# Patient Record
Sex: Male | Born: 1935
Health system: Southern US, Community
[De-identification: ages and names within clinical notes are randomized; demographics above are authoritative.]

## PROBLEM LIST (undated history)

## (undated) DIAGNOSIS — M4800 Spinal stenosis, site unspecified: Secondary | ICD-10-CM

## (undated) DIAGNOSIS — E7800 Pure hypercholesterolemia, unspecified: Secondary | ICD-10-CM

## (undated) DIAGNOSIS — I739 Peripheral vascular disease, unspecified: Secondary | ICD-10-CM

## (undated) DIAGNOSIS — I509 Heart failure, unspecified: Secondary | ICD-10-CM

## (undated) DIAGNOSIS — N183 Chronic kidney disease, stage 3 (moderate): Secondary | ICD-10-CM

## (undated) DIAGNOSIS — Z95810 Presence of automatic (implantable) cardiac defibrillator: Secondary | ICD-10-CM

## (undated) DIAGNOSIS — I2510 Atherosclerotic heart disease of native coronary artery without angina pectoris: Secondary | ICD-10-CM

## (undated) DIAGNOSIS — Z5189 Encounter for other specified aftercare: Secondary | ICD-10-CM

## (undated) DIAGNOSIS — K635 Polyp of colon: Secondary | ICD-10-CM

## (undated) DIAGNOSIS — Z992 Dependence on renal dialysis: Secondary | ICD-10-CM

## (undated) DIAGNOSIS — H9190 Unspecified hearing loss, unspecified ear: Secondary | ICD-10-CM

## (undated) DIAGNOSIS — J189 Pneumonia, unspecified organism: Secondary | ICD-10-CM

## (undated) DIAGNOSIS — I219 Acute myocardial infarction, unspecified: Secondary | ICD-10-CM

## (undated) DIAGNOSIS — I10 Essential (primary) hypertension: Secondary | ICD-10-CM

## (undated) DIAGNOSIS — J45909 Unspecified asthma, uncomplicated: Secondary | ICD-10-CM

## (undated) DIAGNOSIS — G709 Myoneural disorder, unspecified: Secondary | ICD-10-CM

## (undated) DIAGNOSIS — M1990 Unspecified osteoarthritis, unspecified site: Secondary | ICD-10-CM

## (undated) DIAGNOSIS — I519 Heart disease, unspecified: Secondary | ICD-10-CM

## (undated) DIAGNOSIS — G629 Polyneuropathy, unspecified: Secondary | ICD-10-CM

## (undated) DIAGNOSIS — R296 Repeated falls: Secondary | ICD-10-CM

## (undated) DIAGNOSIS — N186 End stage renal disease: Secondary | ICD-10-CM

## (undated) DIAGNOSIS — H409 Unspecified glaucoma: Secondary | ICD-10-CM

## (undated) DIAGNOSIS — L03114 Cellulitis of left upper limb: Secondary | ICD-10-CM

## (undated) DIAGNOSIS — R931 Abnormal findings on diagnostic imaging of heart and coronary circulation: Secondary | ICD-10-CM

## (undated) DIAGNOSIS — E1140 Type 2 diabetes mellitus with diabetic neuropathy, unspecified: Secondary | ICD-10-CM

## (undated) DIAGNOSIS — R0600 Dyspnea, unspecified: Secondary | ICD-10-CM

## (undated) DIAGNOSIS — I701 Atherosclerosis of renal artery: Secondary | ICD-10-CM

## (undated) DIAGNOSIS — IMO0001 Reserved for inherently not codable concepts without codable children: Secondary | ICD-10-CM

## (undated) DIAGNOSIS — N400 Enlarged prostate without lower urinary tract symptoms: Secondary | ICD-10-CM

## (undated) DIAGNOSIS — R269 Unspecified abnormalities of gait and mobility: Secondary | ICD-10-CM

## (undated) DIAGNOSIS — E785 Hyperlipidemia, unspecified: Secondary | ICD-10-CM

## (undated) HISTORY — PX: LUMBAR DISC SURGERY: SHX700

## (undated) HISTORY — DX: Abnormal findings on diagnostic imaging of heart and coronary circulation: R93.1

## (undated) HISTORY — DX: Heart failure, unspecified: I50.9

## (undated) HISTORY — DX: Cellulitis of left upper limb: L03.114

## (undated) HISTORY — DX: Spinal stenosis, site unspecified: M48.00

## (undated) HISTORY — PX: OTHER SURGICAL HISTORY: SHX169

## (undated) HISTORY — DX: Unspecified abnormalities of gait and mobility: R26.9

## (undated) HISTORY — PX: BACK SURGERY: SHX140

## (undated) HISTORY — PX: PROSTATE SURGERY: SHX751

## (undated) HISTORY — DX: Hyperlipidemia, unspecified: E78.5

## (undated) HISTORY — DX: Pure hypercholesterolemia, unspecified: E78.00

## (undated) HISTORY — DX: Chronic kidney disease, stage 3 (moderate): N18.3

## (undated) HISTORY — DX: Essential (primary) hypertension: I10

## (undated) HISTORY — DX: Polyp of colon: K63.5

## (undated) HISTORY — DX: Type 2 diabetes mellitus with diabetic neuropathy, unspecified: E11.40

## (undated) HISTORY — DX: Benign prostatic hyperplasia without lower urinary tract symptoms: N40.0

## (undated) HISTORY — DX: Heart disease, unspecified: I51.9

## (undated) HISTORY — DX: Atherosclerotic heart disease of native coronary artery without angina pectoris: I25.10

---

## 1983-08-06 HISTORY — PX: CORONARY ARTERY BYPASS GRAFT: SHX141

## 1998-05-01 ENCOUNTER — Ambulatory Visit (HOSPITAL_COMMUNITY): Admission: RE | Admit: 1998-05-01 | Discharge: 1998-05-01 | Payer: Self-pay | Admitting: Cardiology

## 1998-05-31 ENCOUNTER — Encounter: Payer: Self-pay | Admitting: Cardiology

## 1998-05-31 ENCOUNTER — Emergency Department (HOSPITAL_COMMUNITY): Admission: EM | Admit: 1998-05-31 | Discharge: 1998-05-31 | Payer: Self-pay | Admitting: Emergency Medicine

## 1998-12-11 ENCOUNTER — Ambulatory Visit (HOSPITAL_COMMUNITY): Admission: RE | Admit: 1998-12-11 | Discharge: 1998-12-11 | Payer: Self-pay | Admitting: Urology

## 2001-08-05 HISTORY — PX: CARDIAC CATHETERIZATION: SHX172

## 2002-03-04 ENCOUNTER — Ambulatory Visit (HOSPITAL_COMMUNITY): Admission: RE | Admit: 2002-03-04 | Discharge: 2002-03-04 | Payer: Self-pay | Admitting: Cardiology

## 2002-03-04 ENCOUNTER — Encounter: Payer: Self-pay | Admitting: Cardiology

## 2002-05-07 ENCOUNTER — Inpatient Hospital Stay (HOSPITAL_COMMUNITY): Admission: RE | Admit: 2002-05-07 | Discharge: 2002-05-09 | Payer: Self-pay | Admitting: Urology

## 2002-05-07 ENCOUNTER — Encounter (INDEPENDENT_AMBULATORY_CARE_PROVIDER_SITE_OTHER): Payer: Self-pay | Admitting: *Deleted

## 2005-01-28 ENCOUNTER — Encounter (INDEPENDENT_AMBULATORY_CARE_PROVIDER_SITE_OTHER): Payer: Self-pay | Admitting: *Deleted

## 2005-01-28 ENCOUNTER — Inpatient Hospital Stay (HOSPITAL_COMMUNITY): Admission: RE | Admit: 2005-01-28 | Discharge: 2005-01-30 | Payer: Self-pay | Admitting: Specialist

## 2005-05-08 ENCOUNTER — Ambulatory Visit: Payer: Self-pay | Admitting: Gastroenterology

## 2005-06-06 ENCOUNTER — Encounter: Admission: RE | Admit: 2005-06-06 | Discharge: 2005-06-06 | Payer: Self-pay | Admitting: Specialist

## 2005-06-20 ENCOUNTER — Encounter (INDEPENDENT_AMBULATORY_CARE_PROVIDER_SITE_OTHER): Payer: Self-pay | Admitting: Specialist

## 2005-06-20 ENCOUNTER — Ambulatory Visit: Payer: Self-pay | Admitting: Gastroenterology

## 2005-07-03 ENCOUNTER — Ambulatory Visit (HOSPITAL_COMMUNITY): Admission: RE | Admit: 2005-07-03 | Discharge: 2005-07-03 | Payer: Self-pay | Admitting: Gastroenterology

## 2006-06-17 ENCOUNTER — Encounter: Admission: RE | Admit: 2006-06-17 | Discharge: 2006-06-17 | Payer: Self-pay | Admitting: Cardiology

## 2006-06-20 ENCOUNTER — Encounter: Admission: RE | Admit: 2006-06-20 | Discharge: 2006-06-20 | Payer: Self-pay | Admitting: Cardiology

## 2007-11-12 ENCOUNTER — Encounter (INDEPENDENT_AMBULATORY_CARE_PROVIDER_SITE_OTHER): Payer: Self-pay | Admitting: Urology

## 2007-11-12 ENCOUNTER — Ambulatory Visit (HOSPITAL_COMMUNITY): Admission: RE | Admit: 2007-11-12 | Discharge: 2007-11-13 | Payer: Self-pay | Admitting: Urology

## 2007-11-14 ENCOUNTER — Emergency Department (HOSPITAL_COMMUNITY): Admission: EM | Admit: 2007-11-14 | Discharge: 2007-11-14 | Payer: Self-pay | Admitting: Emergency Medicine

## 2008-02-03 ENCOUNTER — Emergency Department (HOSPITAL_COMMUNITY): Admission: EM | Admit: 2008-02-03 | Discharge: 2008-02-03 | Payer: Self-pay | Admitting: Emergency Medicine

## 2008-02-09 ENCOUNTER — Ambulatory Visit: Payer: Self-pay | Admitting: *Deleted

## 2008-02-09 ENCOUNTER — Encounter: Admission: RE | Admit: 2008-02-09 | Discharge: 2008-02-09 | Payer: Self-pay | Admitting: Cardiology

## 2009-06-05 DIAGNOSIS — R931 Abnormal findings on diagnostic imaging of heart and coronary circulation: Secondary | ICD-10-CM

## 2009-06-05 HISTORY — DX: Abnormal findings on diagnostic imaging of heart and coronary circulation: R93.1

## 2010-05-22 ENCOUNTER — Ambulatory Visit: Payer: Self-pay | Admitting: Cardiology

## 2010-06-25 ENCOUNTER — Ambulatory Visit: Payer: Self-pay | Admitting: Cardiovascular Disease

## 2010-08-20 ENCOUNTER — Encounter
Admission: RE | Admit: 2010-08-20 | Discharge: 2010-08-20 | Payer: Self-pay | Source: Home / Self Care | Attending: Cardiology | Admitting: Cardiology

## 2010-08-22 ENCOUNTER — Ambulatory Visit: Payer: Self-pay | Admitting: Cardiology

## 2010-08-24 ENCOUNTER — Encounter: Payer: Self-pay | Admitting: Cardiology

## 2010-08-24 DIAGNOSIS — E7800 Pure hypercholesterolemia, unspecified: Secondary | ICD-10-CM | POA: Insufficient documentation

## 2010-08-24 DIAGNOSIS — I255 Ischemic cardiomyopathy: Secondary | ICD-10-CM | POA: Insufficient documentation

## 2010-08-24 DIAGNOSIS — E785 Hyperlipidemia, unspecified: Secondary | ICD-10-CM | POA: Insufficient documentation

## 2010-08-24 DIAGNOSIS — E875 Hyperkalemia: Secondary | ICD-10-CM | POA: Insufficient documentation

## 2010-10-23 ENCOUNTER — Ambulatory Visit: Payer: Self-pay | Admitting: Cardiology

## 2010-11-01 ENCOUNTER — Ambulatory Visit: Payer: Self-pay | Admitting: Cardiology

## 2010-11-06 ENCOUNTER — Other Ambulatory Visit (INDEPENDENT_AMBULATORY_CARE_PROVIDER_SITE_OTHER): Payer: Medicare Other | Admitting: *Deleted

## 2010-11-06 DIAGNOSIS — E7800 Pure hypercholesterolemia, unspecified: Secondary | ICD-10-CM

## 2010-11-07 ENCOUNTER — Ambulatory Visit (INDEPENDENT_AMBULATORY_CARE_PROVIDER_SITE_OTHER): Payer: Medicare Other | Admitting: Cardiology

## 2010-11-07 ENCOUNTER — Encounter: Payer: Self-pay | Admitting: Cardiology

## 2010-11-07 DIAGNOSIS — I259 Chronic ischemic heart disease, unspecified: Secondary | ICD-10-CM

## 2010-11-07 DIAGNOSIS — E1129 Type 2 diabetes mellitus with other diabetic kidney complication: Secondary | ICD-10-CM | POA: Insufficient documentation

## 2010-11-07 DIAGNOSIS — M1990 Unspecified osteoarthritis, unspecified site: Secondary | ICD-10-CM

## 2010-11-07 DIAGNOSIS — E785 Hyperlipidemia, unspecified: Secondary | ICD-10-CM

## 2010-11-07 DIAGNOSIS — J309 Allergic rhinitis, unspecified: Secondary | ICD-10-CM

## 2010-11-07 DIAGNOSIS — I119 Hypertensive heart disease without heart failure: Secondary | ICD-10-CM

## 2010-11-07 LAB — BASIC METABOLIC PANEL
BUN: 45 mg/dL — ABNORMAL HIGH (ref 6–23)
CO2: 25 mEq/L (ref 19–32)
Calcium: 9.5 mg/dL (ref 8.4–10.5)
Chloride: 104 mEq/L (ref 96–112)
Creatinine, Ser: 2 mg/dL — ABNORMAL HIGH (ref 0.4–1.5)
GFR: 35.8 mL/min — ABNORMAL LOW (ref >60.00)
Glucose, Bld: 94 mg/dL (ref 70–99)
Potassium: 6 mEq/L — ABNORMAL HIGH (ref 3.5–5.1)
Sodium: 137 mEq/L (ref 135–145)

## 2010-11-07 LAB — HEPATIC FUNCTION PANEL
ALT: 35 U/L (ref 0–53)
Albumin: 3.8 g/dL (ref 3.5–5.2)
Alkaline Phosphatase: 54 U/L (ref 39–117)
Bilirubin, Direct: 0.3 mg/dL (ref 0.0–0.3)
Total Bilirubin: 1.6 mg/dL — ABNORMAL HIGH (ref 0.3–1.2)
Total Protein: 6.6 g/dL (ref 6.0–8.3)

## 2010-11-07 LAB — LIPID PANEL
Cholesterol: 138 mg/dL (ref 0–200)
LDL Cholesterol: 82 mg/dL (ref 0–99)
Triglycerides: 72 mg/dL (ref 0.0–149.0)
VLDL: 14.4 mg/dL (ref 0.0–40.0)

## 2010-11-07 NOTE — Assessment & Plan Note (Signed)
The patient has been doing well recently with no recurrent angina pectoris.  His last cardiac catheterization was by Dr. Lia Foyer on 03/04/02 and he did not require intervention at that time he had initial CABG in 1985.  His last nuclear stress was 06/16/09 and showed an ejection fraction of 28% with a left bundle-branch block.  He has a moderate area of inferior wall infarct with minimal reversibility.

## 2010-11-07 NOTE — Progress Notes (Signed)
History of Present Illness: This 77 year old married Caucasian gentleman is seen for a four-month followup office visit.  He has a known history of ischemic heart disease.  He underwent cardiac catheterization on 03/04/02 by Dr. Lia Foyer.  He had had previous coronary artery bypass graft surgery in 1985.  He had a nuclear stress test 06/16/09 which showed moderate inferior wall infarct with only minimal reversibility and his ejection fraction was 28%.  Current Outpatient Prescriptions  Medication Sig Dispense Refill  . amLODipine (NORVASC) 10 MG tablet Take 10 mg by mouth daily.        Marland Kitchen aspirin 81 MG tablet Take 81 mg by mouth daily.        Marland Kitchen atorvastatin (LIPITOR) 10 MG tablet Take 10 mg by mouth daily.        . cetirizine (ZYRTEC) 10 MG tablet Take 10 mg by mouth daily.        . Cholecalciferol (VITAMIN D3) 1000 UNITS CAPS 1,000 mg.        . clarithromycin (BIAXIN) 500 MG tablet Take 500 mg by mouth daily.        Marland Kitchen latanoprost (XALATAN) 0.005 % ophthalmic solution 1 drop at bedtime. As directed       . metFORMIN (GLUCOPHAGE) 500 MG tablet Take 500 mg by mouth daily with breakfast.        . metoprolol (TOPROL-XL) 50 MG 24 hr tablet Take 50 mg by mouth. Take one half daily       . MULTIPLE VITAMINS PO Take by mouth. With added b complex       . naproxen sodium (ANAPROX) 220 MG tablet Take 220 mg by mouth 2 (two) times daily with meals.        . niacin (NIASPAN) 1000 MG CR tablet Take 1,000 mg by mouth at bedtime. Two daily       . spironolactone (ALDACTONE) 25 MG tablet Take 25 mg by mouth daily.        . tadalafil (CIALIS) 20 MG tablet Take 20 mg by mouth daily as needed.        . valsartan-hydrochlorothiazide (DIOVAN-HCT) 320-12.5 MG per tablet Take 1 tablet by mouth daily.        Marland Kitchen zolpidem (AMBIEN) 5 MG tablet Take 5 mg by mouth at bedtime as needed.        Marland Kitchen DISCONTD: potassium chloride SA (K-DUR,KLOR-CON) 20 MEQ tablet Take 20 mEq by mouth daily.        . calcium citrate-vitamin D  (CITRACAL+D) 315-200 MG-UNIT per tablet Take 1 tablet by mouth 2 (two) times daily.        . MULTIPLE VITAMIN PO Take by mouth. Vitamin d 3 1000 iu daily       . triamcinolone (NASACORT) 55 MCG/ACT nasal inhaler 2 sprays by Nasal route daily.          Allergies  Allergen Reactions  . Azithromycin     Patient Active Problem List  Diagnoses  . Ischemic heart disease  . Dyslipidemia  . Hyperkalemia  . Hypercholesterolemia  . Diabetes mellitus  . Benign hypertensive heart disease without heart failure  . Allergic rhinitis  . Osteoarthritis    History  Smoking status  . Former Smoker  Smokeless tobacco  . Not on file    History  Alcohol Use     No family history on file.  Review of Systems: Constitutional: no fever chills diaphoresis or fatigue or change in weight.  Head and neck: no hearing loss, no  epistaxis, no photophobia or visual disturbance. Respiratory: No cough, shortness of breath or wheezing. Cardiovascular: No chest pain peripheral edema, palpitations. Gastrointestinal: No abdominal distention, no abdominal pain, no change in bowel habits hematochezia or melena. Genitourinary: No dysuria, no frequency, no urgency, no nocturia. Musculoskeletal:No arthralgias, no back pain, no gait disturbance or myalgias. Neurological: No dizziness, no headaches, no numbness, no seizures, no syncope, no weakness, no tremors. Hematologic: No lymphadenopathy, no easy bruising. Psychiatric: No confusion, no hallucinations, no sleep disturbance.    Physical Exam: Filed Vitals:   11/07/10 1551  BP: 110/72  Pulse: 84  Weight is 205, down 7 pounds.  The general appearance reveals a well-developed well-nourished gentleman in no distress.Pupils equal and reactive.   Extraocular Movements are full.  There is no scleral icterus.  The mouth and pharynx are normal.  The neck is supple.  The carotids reveal no bruits.  The jugular venous pressure is normal.  The thyroid is not enlarged.   There is no lymphadenopathy.The chest is clear to percussion and auscultation. There are no rales or rhonchi. Expansion of the chest is symmetrical.The precordium is quiet.  The first heart sound is normal.  The second heart sound is physiologically split.  There is no murmur gallop rub or click.  There is no abnormal lift or heave.The abdomen is soft and nontender. Bowel sounds are normal. The liver and spleen are not enlarged. There Are no abdominal masses. There are no bruits.The pedal pulses are good.  There is no phlebitis.There is trace edema  There is no cyanosis or clubbing.He wears a brace on his left foot because of footdrop from prior back surgery.   Assessment / Plan:  Continue same medication and we'll plan to see him in 4 months for office visit and fasting lab work.  Continue careful diet and weight loss

## 2010-11-07 NOTE — Assessment & Plan Note (Signed)
The patient has not been having any hypoglycemic episodes.  His weight loss should continue to favorably impact his diabetes.

## 2010-11-07 NOTE — Assessment & Plan Note (Signed)
The patient has been on a careful diet and has lost 7 pounds since last visit.  We reviewed his blood work which shows improvement.  He will continue same medication.

## 2010-11-07 NOTE — Assessment & Plan Note (Signed)
The patient is not having any headaches or dizzy spells related to his blood pressure.  He has had just trace peripheral edema.

## 2010-12-12 ENCOUNTER — Encounter: Payer: Self-pay | Admitting: Cardiology

## 2010-12-18 NOTE — Consult Note (Signed)
NAME:  Shane [name], FLACH NO.:  [id number]   MEDICAL RECORD NO.:  [id number]          PATIENT TYPE:  EMS   LOCATION:  ED                           FACILITY:  [facility]   PHYSICIAN:  Domingo Pulse, M.D.  DATE OF BIRTH:  April 30, 1934   DATE OF CONSULTATION:  11/14/2007  DATE OF DISCHARGE:                                 CONSULTATION   REASON FOR CONSULTATION:  Urinary retention, status post [place].   HISTORY:  This 77 year old male developed a bladder neck contracture and  underwent surgery on Thursday.  He was sent home on Friday morning after  he had urinated.  The patient did well for the first 24 hours, then  developed some problems with bleeding.  He called.  I was asked to come  to the emergency room for fear that he was in clot retention.   A 22-French coude catheter was placed and a small clot was irrigated and  then he had 1400 mL from his bladder.  This came out freely.  The  bladder was carefully irrigated with normal saline and the urine was  very clear.  It was felt, however, that it would be safer to let him go  home with a catheter for a few days and take this out later.  The  patient was given a prescription for Tylox as well as Ambien CR.  He has  pain medications as well as other medications for postoperative  discomfort such as Pyridium Plus.  We will call on Monday to determine  whether he wants to take the catheter out himself or come into the  office for a voiding trial.      Domingo Pulse, M.D.  Electronically Signed     RJE/MEDQ  D:  11/14/2007  T:  11/14/2007  Job:  [id number]

## 2010-12-18 NOTE — Procedures (Signed)
CAROTID DUPLEX EXAM   INDICATION:  Syncope.  Patient experienced several presyncopal events,  each lasting approximately one minute for the past week.   HISTORY:  Diabetes:  Yes.  Cardiac:  MI and CABG x5 in 1985.  Hypertension:  Yes.  Smoking:  No.  Previous Surgery:  Cardiac.  CV History:  Amaurosis Fugax No, Paresthesias No, Hemiparesis No.                                       RIGHT             LEFT  Brachial systolic pressure:         142               130  Brachial Doppler waveforms:         WNL               WNL  Vertebral direction of flow:        Antegrade         Antegrade  DUPLEX VELOCITIES (cm/sec)  CCA peak systolic                   72                83  ECA peak systolic                   102               97  ICA peak systolic                   78                [id number]  ICA end diastolic                   19                26  PLAQUE MORPHOLOGY:                  Heterogenous      Heterogenous  PLAQUE AMOUNT:                      Mild              Mild  PLAQUE LOCATION:                    ICA               ICA   IMPRESSION:  1. Bilateral 1-39% internal carotid artery stenoses, left < right.  2. Patent external carotid arteries.  3. Bilateral antegrade flow in vertebral arteries.   Preliminary report faxed to Dr. Sherryl Barters office at 3:45 p.m. on  02/09/08.   ___________________________________________  P. Drucie Opitz, M.D.   PB/MEDQ  D:  02/09/2008  T:  02/09/2008  Job:  [id number]

## 2010-12-18 NOTE — Op Note (Signed)
NAME:  Shane [name], Shane [name]              ACCOUNT NO.:  [id number]   MEDICAL RECORD NO.:  [id number]          PATIENT TYPE:  AMB   LOCATION:  DAY                          [facility]ACILITY:  [facility]   PHYSICIAN:  Domingo Pulse, M.D.  DATE OF BIRTH:  Sep 01, 1933   DATE OF PROCEDURE:  11/12/2007  DATE OF DISCHARGE:                               OPERATIVE REPORT   PREOPERATIVE DIAGNOSIS:  Bladder neck contracture.   POSTOPERATIVE DIAGNOSIS:  Bladder neck contracture.   PROCEDURE:  TUR BNC.   SURGEON:  Domingo Pulse, M.D.   ANESTHESIA:  General.   COMPLICATIONS:  None.   DRAINS:  A 20-French Foley catheter.   BRIEF HISTORY:  This 77 year old male is status post TURP, was voiding  beautifully, and then began to develop problems with obstructive  symptoms as well as urgency and frequency.  Cystoscopic examination  revealed a tight bladder neck contracture that was not amenable to  dilation in the office.  The patient is now to undergo TUI bladder neck  contracture or possible TUR bladder neck contracture.  He understands  the risks and benefits of the procedure and gave full informed consent.   PROCEDURE:  After successful induction of general anesthesia, the  patient was placed in the dorsal lithotomy position, prepped with  Betadine and draped in the usual sterile fashion.  Cystoscopy was  performed.  The urethra was visualized in its entirety and found to be  normal.  Beyond the verumontanum, a bladder neck contracture could be  identified.  A guidewire was passed through the bladder neck  contracture.  The bladder neck was then dilated up  to 30-French with  Nikki Dom sounds.  The resectoscope was then inserted into the bladder  with the guidewire in place, which was then withdrawn.  A TUI was  performed.  The bladder neck contracture was split in the midline  starting at the midportion of the trigone, extending out to the  verumontanum.  This was taken down towards the muscle fibers at the  bladder neck and did spring this area open, however, there was still  significant fibrous tissue that made the area partially obstructing.  For that reason, the Collings knife was taken off.  The loop was placed  and resection of all this scarred down tissue was removed.  This caused  a nice wide open bladder neck and prostate with no obstruction of any  kind.  The chips were very fibrous in nature.  They were all removed.  The the patient had very  little in the way of hematuria.  The area was fulgurated.  A 20-French  catheter was placed and placed to straight drainage.  The patient  tolerated procedure and was taken to recovery in good condition.  He  will be discharged in the morning.      Domingo Pulse, M.D.  Electronically Signed     RJE/MEDQ  D:  11/12/2007  T:  11/12/2007  Job:  [id number]

## 2010-12-21 NOTE — H&P (Signed)
NAME:  Shane [name], Shane [name]                        ACCOUNT NO.:  [id number]   MEDICAL RECORD NO.:  [id number]                   PATIENT TYPE:  INP   LOCATION:  [id number]                                 [facility]ACILITY:  [facility]   PHYSICIAN:  Domingo Pulse, M.D.               DATE OF BIRTH:  03-Sep-1933   DATE OF ADMISSION:  05/07/2002  DATE OF DISCHARGE:                                HISTORY & PHYSICAL   ADMISSION DIAGNOSES:  1. Benign prostatic hypertrophy with bladder outlet obstruction.  2. Coronary artery disease.  3. Hypertension.  4. Esophageal reflux disease.   HISTORY OF PRESENT ILLNESS:  The patient is a 77 year old male who has had  problems with bladder outlet obstruction.  The patient has urgency,  frequency, decreased force of stream, and significant nocturia.  He had been  treated with 5-alpha reductase in addition (Avidar), as well as alpha  blockade (Flomax) without any real improvement.  The patient's _________  continues to rise, he is quite bothered by this.  He has been given several  options for therapy, including transurethral resection of prostate, TUNA,  and Targis, and elected to undergo a transurethral resection of prostate.  He is to be admitted following that procedure.   PAST MEDICAL HISTORY:  1. Hypertension.  2. Coronary artery disease.  3. The patient had recent evaluation, including cardiac catheterization on     03/04/02, and a Cardiolite on 02/23/02.  The patient does use nitroglycerin     spray approximately one time a month.  4. Status post coronary artery bypass grafting back in 1985.  5. The patient does have some shortness of breath with exertion, most likely     due to his heart disease.  6. He also suffers from some intermittent nasal allergies of an     environmental nature.  7. The patient does have some extremity weakness in the left leg secondary     to having had three surgeries in [place], and 1997.  8. He is known to have esophageal reflux  disease.  9. He does have some bursitis on the left-hand side.  10.      The patient is hard of hearing, and does have hearing aids,     although usually does not wear these.   CURRENT MEDICATIONS:  1. Flomax 0.4 mg q.d.  2. Lipitor 10 mg q.d.  3. Avidar 0.5 mg q.d.  4. Valium 2 mg p.r.n. sleep.  5. Diovan/hydrochlorothiazide combination one q.d.  6. Norvasc 10 mg q.d.  7. Toprol XL 50 mg q.d.  8. Prilosec 20 mg q.d.  9. Aleve 220 mg three tablets q.d.  This has been discontinued.  10.      Aspirin 5 mg q.d.  This has been discontinued.  11.      Zyrtec 10 mg q.d.  12.      Fish oil one or two tablets q.d.  13.  Over-the-counter garlic, glucosamine, vitamin E, fish oil, vitamin     C, and multivitamins.   PAST SURGICAL HISTORY:  1. Coronary artery bypass grafting in 1985.  2. Lumbar laminectomy in 1981, 1987, and 1997.   SOCIAL HISTORY:  He does not use tobacco or alcohol.   REVIEW OF SYMPTOMS:  As described above.   FAMILY HISTORY:  Noncontributory.   PHYSICAL EXAMINATION:  VITAL SIGNS:  Temperature is 98.6, pulse 76, blood  pressure 138/86.  HEENT:  Normocephalic, atraumatic.  Cranial nerves II-XII intact.  NECK:  Supple, no adenopathy or thyromegaly.  LUNGS:  Clear.  HEART:  Regular rate and rhythm, no murmurs, thrills, gallops, rubs, or  heaves.  The patient does have a median sternotomy incision.  ABDOMEN:  Soft, nontender, no palpable masses, rebound, or guarding.  EXTREMITIES:  No cyanosis, clubbing, or edema.  NEUROLOGIC:  Appears grossly intact.  GENITOURINARY:  Penis is free of any lesions.  Testicles are unremarkable.  The patient has no hydroceles, spermatocele, varicocele, hernia, or  adenopathy.  RECTAL:  Showed a 2 to 3+ benign feeling prostate.   IMPRESSION:  Symptomatic benign prostatic hypertrophy/bladder outlet  obstruction.   PLAN:  Admit following transurethral resection of prostate.                                                 Domingo Pulse, M.D.    RJE/MEDQ  D:  05/07/2002  T:  05/07/2002  Job:  [id number]   cc:   Thomas A. Mare Ferrari, M.D.

## 2010-12-21 NOTE — Cardiovascular Report (Signed)
NAME:  Shane [name], Shane [name]                        ACCOUNT NO.:  [id number]   MEDICAL RECORD NO.:  [id number]                   PATIENT TYPE:  OIB   LOCATION:  2869                                 FACILITY:  [facility]   PHYSICIAN:  Loretha Brasil. Lia Foyer, M.D. [facility]         DATE OF BIRTH:  May 03, 1934   DATE OF PROCEDURE:  DATE OF DISCHARGE:                              CARDIAC CATHETERIZATION   INDICATIONS FOR PROCEDURE:  The patient is a 77 year old gentleman who  underwent revascularization surgery in 1985 by Dr. Redmond Pulling.  At that time, he  had an internal mammary placed with diagonal and LAD, and right internal  mammary placed to the OM, and saphenous vein graft placed to the posterior  descending and posterolateral artery.  He has continued to do well since  1985.  More recently, he has had an urological evaluation, and may require  surgery.  He underwent a Cardiolite study which demonstrated some ischemia  in the distribution of the diagonal as well as the right coronary artery  territory.  The current study was done to assess coronary anatomy.   PROCEDURE:  1. Left heart catheterization.  2. Selective coronary arteriography.  3. Selective left ventriculography.  4. Saphenous vein graft angiography x1.  5. Selective left internal mammary angiography.  6. Selective right internal mammary angiography.  7. Distal aortic root aortography.   DESCRIPTION OF PROCEDURE:  The patient was brought to the cath lab, and  prepped and draped in the usual fashion.  Through an anterior puncture, the  right femoral artery was easily entered.  A 6-French sheath was placed.  Views of the left and right coronary arteries were obtained.  Selective  injections were done in the left internal mammary artery.  The right  internal mammary artery was nonselectively injected as we got close to the  site, but I wanted to limit manipulation of the catheter.  There was  excellent visualization of the vessel.  The vein  graft was thought to be  cannulated, although it is difficult to be sure.  In 1999, the vein graft  was known to be occluded.  Subsequently, the patient was given 5 mg of  intravenous metoprolol and taken to the holding area in satisfactory  clinical condition.   HEMODYNAMIC DATA:  1. Central aorta 141/86.  2. Left ventricle 150/10.  3. No aortic to left ventricular gradient on pull back across the aortic     valve.   ANGIOGRAPHIC DATA:  1. Ventriculography was performed in the RAO projection.  The mid, distal,     and anterolateral segments, apical segments, and distal inferior segments     all appeared to move well.  There appeared to be moderately hypo to near     akinesis of the inferobasal segment.  Ejection fraction is calculated at     43%.  There did not appear to be significant mitral regurgitation and the     aortic  leaflets appeared to open well.  2. The distal aorta demonstrates smooth-appearing aorta throughout.  Both     renal arteries appear to be widely patent.  3. The left main coronary artery is totally occluded.  There is faint     opacification of what appears to be either an intermediate or a high     diagonal branch.  4. The right coronary artery is diffusely diseased proximally and then     totally occluded right after the origin of a small right ventricular     branch.  5. The internal mammary to the second diagonal and distal LAD is widely     patent.  The distal right diagonal and distal LAD both appear to be good     vessels without high grade stenosis.  There is a first diagonal which     appears to fill by  retrograde collaterals from the second diagonal.  In     addition, the PDA fills by retrograde collaterals from the LAD.  6. The right internal mammary appears to be widely patent to the OM.  The OM     is a large caliber vessel that has some luminal irregularity, but no     critical disease.  This appears to collateralize the posterolateral      segment of the right coronary.  7. The vein graft to the posterior descending and posterolateral branch is     known to be occluded on the previous study and is presently occluded.   CONCLUSION:  1. Moderate reduction in global left ventricular function with an     inferobasal wall motion abnormality.  2. Continued patency of the diagonal, left anterior descending, internal     mammary graft with collateralizations of a more proximal diagonal and     posterior descending artery.  3. Continued patency of the right internal mammary to the obtuse marginal     with collateralization of the posterolateral system.  4. Total occlusion of the right coronary artery with total occlusion of vein     grafts to the posterior descending and proximal left anterior descending     with known retrograde collaterals from both the obtuse marginal and left     anterior descending grafts.   DISPOSITION:  I am quite pleased with the present findings.  I suspect that  he can continue to be treated medically at the present time.  Followup with  be with Dr. Mare Ferrari.                                                Loretha Brasil. Lia Foyer, M.D. [facility]    TDS/MEDQ  D:  03/04/2002  T:  03/10/2002  Job:  [id number]   cc:   Thomas A. Mare Ferrari, M.D.   Domingo Pulse, M.D.   Denton Meek. Redmond Pulling, M.D.

## 2010-12-21 NOTE — Op Note (Signed)
NAME:  Shane [name], Shane [name]                        ACCOUNT NO.:  [id number]   MEDICAL RECORD NO.:  [id number]                   PATIENT TYPE:  INP   LOCATION:  [id number]                                 FACILITY:  [facility]   PHYSICIAN:  Domingo Pulse, M.D.               DATE OF BIRTH:  12-22-1933   DATE OF PROCEDURE:  05/07/2002  DATE OF DISCHARGE:                                 OPERATIVE REPORT   PREOPERATIVE DIAGNOSIS:  Benign prostatic hypertrophy with bladder outlet  obstruction.   POSTOPERATIVE DIAGNOSIS:  Benign prostatic hypertrophy with bladder outlet  obstruction.   PROCEDURE:  TURP.   SURGEON:  Domingo Pulse, M.D.   ANESTHESIA:  General.   COMPLICATIONS:  None.   DRAIN:  A 24 French three-way Foley catheter.   BRIEF HISTORY:  This 77 year old male has had problems with bladder outlet  obstruction.  He had decrease in his force of stream along with nocturia,  urgency,and frequency.  He has been on a combination of alpha blockade with  Flomax and a 5-alpha-reductase inhibitor with Avodart.  Despite aggressive  medical management, the patient continues to have a rising AUA symptom score  and is quite symptomatic.  He has requested that a TURP be performed.  He  understands the risks and benefits of the procedure and gave full and  informed consent.   DESCRIPTION OF PROCEDURE:  After the successful induction of general  anesthesia, the patient was placed in the dorsal lithotomy position and  prepped with Betadine and draped in the usual sterile fashion.  The urethra  was dilated to 30 [place] with [employer].  The patient had a very  tight meatus, and it was very difficult to get full dilation.  Attempts at  passing the standard resectoscope were unsuccessful.  A cystoscope was  inserted, and it was found that there was a little bit of trauma to urethra.  A guidewire was then passed in order to aid in dilation.  The patient was  dilated once again using [employer]  sounds.  The smaller of the resectoscope  sheaths was then passed into the bladder successfully.  The resectoscope was  then inserted; the bladder was carefully inspected.  It was free of tumor or  stones.  Both ureteral orifices were normal in configuration and location.  The patient had lateral lobe hypertrophy as well as a somewhat high and  tight bladder neck.  Bladder neck was taken down until flat.  The median  lobe was then resected all the way back to the verumontanum.  This was  followed by resection of the right lateral lobe starting at the 11o'clock  position extending down to the floor of the prostate.  The left lateral lobe  was resected in identified fashion.  A small amount of apical tissue as well  as a little bit of tissue from the top of the prostate were removed.  The  chips were all irrigated from the bladder.  Adequate hemostasis was  obtained.  Visual inspection showed a wide open prostatic fossa with little  if any residual tissue.  Verumontanum and sphincter mechanism had not been  injured, and bladder neck had not been undermined.  The ureteral orifices  were normal.  The trigone was nicely exposed.  The chips had all been  removed from the bladder.  Adequate hemostasis was obtained.  On rectal  examination, could find little if any residual prostatic tissue.  Once a  final inspection showed that all chips had been removed and adequate  hemostasis had been obtained, the resectoscope was removed.  A 24 French  three-way catheter was then easily passed into the bladder using a Stylet  guide.  The bladder was placed to straight drainage, and the Foley balloon  was filled.  Slight traction was placed.  The catheter irrigated freely.  Continuous bladder irrigation was initiated with normal saline.  The  catheter was attached to the patient with a catheter strap, and slight  traction was applied.  The patient tolerated the procedure well and was  taken to the recovery  room in good condition.                                               Domingo Pulse, M.D.    RJE/MEDQ  D:  05/07/2002  T:  05/07/2002  Job:  [id number]   cc:   Thomas A. Mare Ferrari, M.D.

## 2010-12-21 NOTE — Consult Note (Signed)
NAME:  Shane [name], Shane [name]              ACCOUNT NO.:  [id number]   MEDICAL RECORD NO.:  [id number]          PATIENT TYPE:  INP   LOCATION:  1521                         FACILITY:  [facility]   PHYSICIAN:  Synthia Innocent, M.D.  DATE OF BIRTH:  August 29, 1933   DATE OF CONSULTATION:  01/28/2005  DATE OF DISCHARGE:                                   CONSULTATION   REQUESTING PHYSICIAN:  Susa Day, M.D.   CHIEF COMPLAINT:  Postoperative retention.   HISTORY OF PRESENT ILLNESS:  This 77 year old man underwent back surgery  earlier on in the day.  The staff had trouble inserting a Foley catheter.  I  was called in to place the catheter.  The patient apparently had a TUR of  the prostate by Dr. Amalia Hailey 3-4 years ago.  He states to some difficulty  voiding since.   PAST MEDICAL HISTORY:  The patient takes Tylenol and Aleve for medications.   No known drug allergies.   Denies any smoking or drinking.   Negative family history.   Preoperatively, he was demonstrated to have multilevel lumbar spondylosis as  well as disk degeneration and also some laminectomy defects.   LABORATORY DATA:  His preop BUN was 25, creatinine 1.5.  Electrolytes were  normal.  His calcium was 8.9.   Preop chest x-ray showed no active disease and history of a CABG.   His EKG showed normal sinus rhythm with an occasional PVC.   PHYSICAL EXAMINATION:  GENERAL:  He is in no acute distress.  ABDOMEN:  Somewhat of a protuberant abdomen.  No obvious palpable bladder.  GENITOURINARY:  Uncircumcised male.  Normal penis and meatus.  No lesions.  Normal scrotum and testicles.  RECTAL:  Deferred.   By bladder scan in the bed, he was demonstrated to have a 586 cc residual.  I went over the findings with the patient and I recommended that we try to  put a catheter in.  He understands and agrees to the proposed procedure.   Initially, I injected 20 cc of Xylocaine jelly.  I then attempted to pass a  catheter without success.  I  then went ahead and was able to get a filiform  into the bladder, after several attempts.  I then followed up with both a 12  and 14 [place] follower; however, I was not able to get a 16 [place] follower  in, so I backed in and put a 14 [place] in and drained off about 600 cc of  grossly clear urine.  This was submitted to the lab for a culture.  I then  taped the filiform  and follower to a drainage bag, and we plan to keep this in overnight and  then take it out in the morning for a trial of voiding or possibly try to  put in a catheter.  I suspect he has a bladder neck contracture, but he  should be stable for tonight.       RFS/MEDQ  D:  01/28/2005  T:  01/28/2005  Job:  [id number]

## 2010-12-21 NOTE — Op Note (Signed)
NAME:  Shane [name], Shane [name]              ACCOUNT NO.:  [id number]   MEDICAL RECORD NO.:  [id number]          PATIENT TYPE:  AMB   LOCATION:  DAY                          [facility]ACILITY:  [facility]   PHYSICIAN:  Susa Day, M.D.    DATE OF BIRTH:  April 20, 1934   DATE OF PROCEDURE:  01/28/2005  DATE OF DISCHARGE:                                 OPERATIVE REPORT   PREOPERATIVE DIAGNOSES:  1.  Spinal stenosis.  2.  Herniated nucleus pulposus at L1-2.  3.  Lateral recess stenosis.  4.  Multilevel lumbar spondylosis.   POSTOPERATIVE DIAGNOSES:  1.  Spinal stenosis.  2.  Herniated nucleus pulposus at L1-2.  3.  Lateral recess stenosis.  4.  Multilevel lumbar spondylosis.   PROCEDURES PERFORMED:  1.  Central lumbar decompression at L1-2.  2.  Lateral recess decompression with foraminotomies of L2 and L1      bilaterally.  3.  Microdiskectomy at L1-2.   ANESTHESIA:  General.   SURGEON:  Johnn Hai, M.D.   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  He had some hip adductor weakness and hip flexor weakness.  He has a  history of multilevel lumbar spondylosis and status post multiple level  lumbar decompressions.  He had been refractory to conservative treatment  with severe pain and weakness.  Operative intervention was indicated for  decompression essentially and microdiskectomy and lateral recess  decompression.  The risks and benefits discussed, including bleeding,  infection, damage to neurovascular structures, CSF leakage, epidural  fibrosis, adjacent segment disease, need for fusion in the future,  persistent pain, anesthetic complications, DVT, PE, etc.   TECHNIQUE:  The patient in supine position.  After the induction of adequate  general anesthesia and  1 g of Kefzol, she was placed prone on the [place]  frame and all bony prominences well-padded.  Prior to this, we attempted to  place a routine Foley.  It was unsuccessful, followed by a coude catheter  which was unsuccessful as well.  We therefore placed him in a supine  position, prepped and draped him in the usual sterile fashion.  Two 18 gauge  spinal needles were utilized to localize the 1-2 spinous process, confirmed  with x-ray.  An incision was made from the spinous process of 1 to 2.  Subcutaneous tissue was dissected.  Electrocautery was utilized to achieve  hemostasis.  The dorsolumbar fascia was identified and divided in line with  the skin incision.  Paraspinous muscle elevated from the lamina of 1 and 2.  Kochers were placed in the spinous process of 1 and 2.  This was confirmed  with x-ray.  McCullough retractor was placed.  Operating microscope was  draped and brought into the surgical field.  Due to the small interlaminar  space, it was felt a central decompression at this level was appropriate.  Performed a central  decompression with removal of interspinous ligament in  partial of the spinous process of 1 and of 2 to the lamina.  Hemilaminotomy  of the caudad edge of 1 was performed bilaterally with detachment of the  ligamentum flavum.  Foraminotomies and detachment of ligamentum flavum from  the cephalad edge of 2 was performed as well, first laterally and into the  foramen with a 2 mm Kerrison only.  There was severe lateral recess stenosis  secondary to ligamentum facet hypertrophy bilaterally.  Again, we  decompressed first laterally, performed a partial medial hemifacetectomy,  and then the ligamentum flavum was removed as well.  No retraction on the  thecal sac was utilized.  There was steady soft tissue bleeding from the  cancellous bone as well as from the epidural venous plexus.  Bipolar  electrocautery and bone wax were utilized to achieve hemostasis.  After  we  performed the decompression out laterally from the medial border of the  pedicle, performed foraminotomies, then further removed the ligamentum  flavum from the left and the lateral recess, undercutting the facets and the  foraminotomy of L1 was performed as well.  Next, we were able to gain  lateral to the thecal sac at this level and with a nerve hook, we were able  to mobilize then multiple free fragments of disk material from beneath the 1  and 2 roots and the thecal sac as well as cephalad.  These were retrieved  with a micropituitary and sent to pathology.  Went above the L1 nerve root  as well.  At the L1-2 disk space, was able to removed disk from the  subannular space as well as medially.  As well, this was further mobilized  with a nerve hook laterally and medially, further mobilizing multiple pieces  that were retrieved with a micropituitary.  Following this, the root of 1  and 2 were free.  I placed a hockey-stick probe out the foramen of 2 and 1  and found to be widely patent bilaterally.  Epidural venous plexus was  cauterized with bipolar electrocautery.  Again, I examined above and below  the 1 root and down in the 2 foramen beneath the thecal sac, sweeping with a  nerve hook, and no additional fragments were retrieved, and I checked from  the contralateral side as well.  No residual free fragments or further  significant disk herniation from the L1-2 space was noted.  Disk space was  copiously irrigated with antibiotic irrigation.  One point during the  procedure, the patient had performed a Valsalva maneuver and slightly moved  from his four-point position.  He was repositioned without difficulty.  Bone  wax placed in the cancellous surfaces.  I placed thrombin-soaked Gelfoam on  the lateral recesses and over the laminotomy defect.  There was no evidence  of active bleeding.  This bone wax was placed over the partial of the cancellous surface of the spinous processes  as well.  Placed a Hemovac and  brought out through a lateral stab wound in the skin.  The fascia was  reapproximated #1 Vicryl interrupted figure-of-eight suture.  The  subcutaneous tissue reapproximated with 2-0 Vicryl simple sutures.  The skin  was reapproximated with staples.  The wound was dressed sterilely.  He was  placed supine on the hospital bed, extubated without difficulty, and  transported to the recovery room in satisfactory condition.   The patient tolerated the procedure well, and there were no complications.  Blood loss was  150 mL.       JB/MEDQ  D:  01/28/2005  T:  01/28/2005  Job:  [id number]

## 2010-12-21 NOTE — H&P (Signed)
NAME:  Shane [name], Shane [name]                        ACCOUNT NO.:  [id number]   MEDICAL RECORD NO.:  [id number]                   PATIENT TYPE:  OIB   LOCATION:  2869                                 FACILITY:  [facility]   PHYSICIAN:  Davis Gourd, P.A.-C.             DATE OF BIRTH:  [birth year]   DATE OF PROCEDURE:  DATE OF DISCHARGE:                      STAT - MUST CHANGE TO CORRECT WORK TYPE   CHIEF COMPLAINT:  Shortness of breath and abnormal Cardiolite.   HISTORY OF PRESENT ILLNESS:  The patient is a 77 year old male with a  history of bypass surgery in 1985 who has been followed closely by Dr.  Joyice Faster. Brackbill.  His last heart catheterization was in 1999 and at that  time the LIMA to first diagonal and LAD was patent and the RIMA to the OM  was patent but the SVG to PDA and PLA was totalled.  There was collateral  circulation and medical therapy was recommendation.   The patient has been having some exertional shortness of breath, although he  denies any kind of chest pain.  He does not exercise regularly but the  symptoms are generally exertional.  He has not had any resting symptoms.  Because there is a possibility that he will need prostate surgery in the not  too distant future, he had an outpatient Cardiolite performed by Dr. Joyice Faster. Brackbill.  The Cardiolite showed an inferior scar with peri-infarct  ischemia.  Additionally, there was reversible ischemia in the distribution  of the diagonal.  It was also noted that the EF was decreased at 29%.  He  was referred for outpatient cardiac catheterization by Dr. Loretha Brasil.  Stuckey.   PAST MEDICAL HISTORY:  1. Status post aortocoronary bypass surgery in 1985 with LIMA to the first     diagonal and LAD, RIMA to OM-1, SVG to PDA and PLA.  2. Status post cardiac catheterization in 1999 with left main totalled,     right coronary artery 90%, acute marginal 99%, SVG to RCA totalled, other     grafts patent, left to right  collaterals and EF of 50-55%.  3. Status post MI in 1985 with CABG.  4. Hypertension.  5. Hyperlipidemia.  6. Family history of premature coronary artery disease.  7. Mild obesity.  8. History of BPH.  9. History of osteoarthritis/bursitis.   PAST SURGICAL HISTORY:  Cardiac catheterization x 2, CABG, and multiple back  surgeries.   SOCIAL HISTORY:  He lives in [place] with his wife and he is retired  from [employer].  He is working now as a Company secretary.  He has no  significant tobacco history and does not drink alcohol.  He exercises  occasionally and his diet is moderately healthy.   FAMILY HISTORY:  His mother did not have coronary artery disease but his  father died of a MI at age 68 and his son is currently living but has had  stents x 2.  His siblings have a known history of coronary artery disease.   ALLERGIES:  He had a rash with ZITHROMAX.   MEDICATIONS:  1. Coated aspirin 325 mg q.d.  2. Valium 2 mg p.r.n.  3. Nasacort p.r.n.  4. Prilosec 20 mg q.d.  5. Niaspan 750 mg b.i.d.  6. Toprol XL 25 mg q.d.  7. Zyrtec 10 mg q.d.  8. Norvasc 5 mg q.d.  9. Diovan/HCTZ 80/12.5 mg q.d.  10.      Aleve 2-3 tablets q.d.   REVIEW OF SYSTEMS:  Positive for dyspnea on exertion but he denies  orthopnea, paroxysmal nocturnal dyspnea, edema, or palpitations.  There is  no significant cough or wheezing.  He had some urinary frequency and some  nocturia.  Otherwise has no problems with urination.  He has left lower  extremity numbness from his ongoing back problems and he has had some  arthralgias in his left shoulder as well.  He has rare GERD symptoms since  he has been on the Prilosec.  He has had no other GI problems.  Review of  systems is otherwise negative.   PHYSICAL EXAMINATION:  VITAL SIGNS:  Temperature 97.4, blood pressure  152/83, pulse 84, respiration 20.  GENERAL:  He is a well-developed, slightly obese white male in no acute  distress.  HEENT:  His head  is normocephalic and atraumatic.  Pupils are equal, round,  and reactive to light and accommodation.  Sclerae are clear and nares are  without discharge.  NECK:  Supple and without lymphadenopathy, thyromegaly, or bruit.  No JVD is  noted.  LYMPHADENOPATHY:  None.  CARDIOVASCULAR:  Heart is regular in rate and rhythm with S1, S2, and no  significant S4 or murmur.  PULSES:  Distal pulses are 2+ and equal and he has no bruits noted.  LUNGS:  Clear to auscultation bilaterally.  SKIN:  Some ecchymoses from prior blood draws but no significant rash or  lesions.  ABDOMEN:  Soft and nontender with active bowel sounds and no  hepatosplenomegaly noted.  EXTREMITIES:  There is no cyanosis, clubbing, or edema.  There is no rashes  or lesions.  MUSCULOSKELETAL:  He has no joint deformity or effusions.  No spine or CVA  tenderness.  There is no area of tenderness in his left shoulder.  NEUROLOGIC:  He is alert and oriented with cranial nerves II through XII  grossly intact and no focal deficits noted.   LABORATORY DATA:  He is in sinus rhythm with a rate of 73.  There are no  acute ischemic changes.  Chest x-ray is pending at the time of dictation.   Hemoglobin 14.2, hematocrit 41.6, WBC 5.5, platelets 124,000.  Sodium 139,  potassium 4.4, chloride 106, CO2 27, BUN 23, creatinine 1.1, glucose 120.  Pro time/INR is pending.  PSA done July 2003 is 1.18.  Cholesterol profile  on July 2003 showing total cholesterol is 187 with triglycerides of 163, HDL  26, LDL 128.   ASSESSMENT/PLAN:  1. Shortness of breath, abdominal Cardiolite:  The patient is here for a     cardiac catheterization today.  Percutaneous intervention p.r.n.  2. Hyperlipidemia:  Consider for addition of statin per Dr. Joyice Faster.     Brackbill.  3. Mild hyperglycemia:  Consider for further evaluation per Dr. Marcello Moores A.     Brackbill.  4.    History of benign prostatic hypertrophy:  The patient follows with Dr.     Marcello Moores  A.  Brackbill and Dr. Domingo Pulse for this.  5. The patient is otherwise stable.  See past medical history.                                               Davis Gourd, P.A.-C.    RG/MEDQ  D:  03/04/2002  T:  03/06/2002  Job:  [id number]

## 2010-12-21 NOTE — Discharge Summary (Signed)
NAME:  Shane [name], Shane [name]              ACCOUNT NO.:  [id number]   MEDICAL RECORD NO.:  [id number]          PATIENT TYPE:  INP   LOCATION:  1521                         FACILITY[facility]   PHYSICIAN:  Susa Day, M.D.    DATE OF BIRTH:  18-May-1934   DATE OF ADMISSION:  01/29/2005  DATE OF DISCHARGE:  01/30/2005                                 DISCHARGE SUMMARY   ADMISSION DIAGNOSES:  1.  Spinal stenosis.  2.  Hypertension.  3.  Hyperlipidemia.  4.  Benign prostatic hypertrophy.  5.  Coronary artery disease.   DISCHARGE DIAGNOSES:  1.  Status post lumbar decompression L2.  2.  Postoperative urinary retention.  3.  Spinal stenosis.  4.  Hypertension.  5.  Hyperlipidemia.  6.  Benign prostatic hypertrophy.  7.  Coronary artery disease.   CONSULTATIONS:  PT, OT, urology.   PROCEDURE:  The patient was sent to the OR on January 28, 2005 to undergo  lumbar decompression centrally at L1-2 with microdiskectomy at this level.  Surgeon Susa Day, M.D., assistant Rometta Emery, P.A., anesthesia  general, complications none.   HISTORY:  Mr. Etris is a 77 year old gentleman with several months  history of low back and lower extremity pain that appears to be worse with  walking. On exam, the patient has a positive straight leg raise,  ____________. MRI films did reveal significant stenosis L1-2. The patient  unfortunately failed conservative treatment with positive neurotension signs  and it was felt he would benefit from a lumbar decompression a the L1-2  leve. The risks and benefits of the surgery were discussed with the patient  and he wished to proceed.   LABORATORY DATA:  Preoperative H&H was 14.1, hematocrit 40.5. At the time of  discharge, hemoglobin 12.2, hematocrit 34.8, routine chemistries done  preoperatively show sodium 135, potassium 3.9, glucose 167, BUN of 25,  creatinine 1.5. He had a slightly decreased sodium at the time of discharge  of 134, potassium 3.5,  glucose of 150. BUN of 14, creatinine 1.1. Urinalysis  done preoperatively showed amber color with a small bilirubin and trace  ketones. Repeat urinalysis was obtained which showed large amounts of  hemoglobin, 30 mg/DL of protein with too numerous to count RBC's noted after  multiple attempts of catheterization. Urine culture showed no growth.  Preoperative EKG showed nonspecific intraventricular block, normal sinus  rhythm was noted, nonspecific T wave abnormalities were noted.   Preoperative chest x-ray showed no active disease, thee was no previous  CABG.   HOSPITAL COURSE:  The patient was admitted and went to the OR for the above  stated procedure. He was then transferred to the PACU. One hemovac drain was  placed at the time of surgery. Catheterization was attempted preoperatively;  however, was unsuccessful. Once in the PACU, the patient was asked to void,  however, he was unable to do this x6. Repeat catheterization was tried,  however, unsuccessful. Following that a urology consult was requested  secondary to urinary retention. While upon the orthopedic floor, Dr. Joelyn Oms  came in to see the patient. He was successful in catheterizing  the patient  with a filiform followed by a [place] follower. I was able to drain 600 mL of  clear urine from the patient's bladder. Unfortunately during the night, the  patient turned in bed, hemovac was pulled as well as his catheter. However,  that day he did advance well with his therapy and was able to void without  significant difficulty. He was reevaluated by urology who felt the patient  was stable to be discharged home. He could followup on an outpatient basis.  The patient was doing well from his decompression. A decision made to  discharge home. Followup with Dr. Tonita Cong in 1-2 weeks, followup with  urologist as instructed. The patient cannot walk as tolerated. He should  avoid any bending, twisting, lifting or stooping. Diet is as  tolerated,  dressing changed daily. Okay for him to shower.   DISCHARGE MEDICATIONS:  1.  Vicodin 1-2 p.o. q.4-6h. p.r.n. pain.  2.  Resume all home medications.   CONDITION ON DISCHARGE:  Stable.   FINAL DIAGNOSES:  Status post lumbar decompression, resolution of urinary  retention.       CS/MEDQ  D:  02/13/2005  T:  02/13/2005  Job:  [id number]

## 2010-12-24 ENCOUNTER — Other Ambulatory Visit: Payer: Self-pay | Admitting: Cardiology

## 2010-12-24 DIAGNOSIS — I119 Hypertensive heart disease without heart failure: Secondary | ICD-10-CM

## 2010-12-24 NOTE — Telephone Encounter (Signed)
escribe request  

## 2011-01-18 ENCOUNTER — Encounter: Payer: Self-pay | Admitting: Cardiology

## 2011-01-23 ENCOUNTER — Other Ambulatory Visit: Payer: Self-pay | Admitting: Cardiology

## 2011-01-23 DIAGNOSIS — E7800 Pure hypercholesterolemia, unspecified: Secondary | ICD-10-CM

## 2011-01-23 DIAGNOSIS — Z889 Allergy status to unspecified drugs, medicaments and biological substances status: Secondary | ICD-10-CM

## 2011-01-23 DIAGNOSIS — I10 Essential (primary) hypertension: Secondary | ICD-10-CM

## 2011-01-23 DIAGNOSIS — T7840XA Allergy, unspecified, initial encounter: Secondary | ICD-10-CM

## 2011-01-24 NOTE — Telephone Encounter (Signed)
escribe request  

## 2011-02-08 ENCOUNTER — Inpatient Hospital Stay (HOSPITAL_COMMUNITY)
Admission: AD | Admit: 2011-02-08 | Discharge: 2011-02-14 | DRG: 558 | Disposition: A | Discharge status: Home Health | Payer: Medicare Other | Source: Ambulatory Visit | Attending: Internal Medicine | Admitting: Internal Medicine

## 2011-02-08 DIAGNOSIS — A4901 Methicillin susceptible Staphylococcus aureus infection, unspecified site: Secondary | ICD-10-CM | POA: Diagnosis present

## 2011-02-08 DIAGNOSIS — N179 Acute kidney failure, unspecified: Secondary | ICD-10-CM | POA: Diagnosis present

## 2011-02-08 DIAGNOSIS — I2510 Atherosclerotic heart disease of native coronary artery without angina pectoris: Secondary | ICD-10-CM | POA: Diagnosis present

## 2011-02-08 DIAGNOSIS — IMO0002 Reserved for concepts with insufficient information to code with codable children: Secondary | ICD-10-CM | POA: Diagnosis present

## 2011-02-08 DIAGNOSIS — N183 Chronic kidney disease, stage 3 unspecified: Secondary | ICD-10-CM | POA: Diagnosis present

## 2011-02-08 DIAGNOSIS — I129 Hypertensive chronic kidney disease with stage 1 through stage 4 chronic kidney disease, or unspecified chronic kidney disease: Secondary | ICD-10-CM | POA: Diagnosis present

## 2011-02-08 DIAGNOSIS — Z951 Presence of aortocoronary bypass graft: Secondary | ICD-10-CM

## 2011-02-08 DIAGNOSIS — L03119 Cellulitis of unspecified part of limb: Secondary | ICD-10-CM | POA: Insufficient documentation

## 2011-02-08 DIAGNOSIS — M7020 Olecranon bursitis, unspecified elbow: Principal | ICD-10-CM | POA: Diagnosis present

## 2011-02-08 DIAGNOSIS — N400 Enlarged prostate without lower urinary tract symptoms: Secondary | ICD-10-CM | POA: Diagnosis present

## 2011-02-08 LAB — CBC
HCT: 35.1 % — ABNORMAL LOW (ref 39.0–52.0)
Hemoglobin: 11.8 g/dL — ABNORMAL LOW (ref 13.0–17.0)
MCH: 30.1 pg (ref 26.0–34.0)
MCHC: 33.6 g/dL (ref 30.0–36.0)
MCV: 89.5 fL (ref 78.0–100.0)
Platelets: 134 10*3/uL — ABNORMAL LOW (ref 150–400)
RBC: 3.92 MIL/uL — ABNORMAL LOW (ref 4.22–5.81)
RDW: 13.7 % (ref 11.5–15.5)
WBC: 11.5 10*3/uL — ABNORMAL HIGH (ref 4.0–10.5)

## 2011-02-08 LAB — SEDIMENTATION RATE: Sed Rate: 45 mm/hr — ABNORMAL HIGH (ref 0–16)

## 2011-02-08 LAB — BASIC METABOLIC PANEL
Calcium: 9.3 mg/dL (ref 8.4–10.5)
Creatinine, Ser: 2.08 mg/dL — ABNORMAL HIGH (ref 0.50–1.35)
GFR calc Af Amer: 38 mL/min — ABNORMAL LOW (ref >60)
GFR calc non Af Amer: 31 mL/min — ABNORMAL LOW (ref >60)
Glucose, Bld: 118 mg/dL — ABNORMAL HIGH (ref 70–99)
Potassium: 4 mEq/L (ref 3.5–5.1)
Sodium: 137 mEq/L (ref 135–145)

## 2011-02-08 LAB — GLUCOSE, CAPILLARY: Glucose-Capillary: 113 mg/dL — ABNORMAL HIGH (ref 70–99)

## 2011-02-08 NOTE — H&P (Signed)
NAMEMarland Kitchen  JAXN, WESTON NO.:  [id number]  MEDICAL RECORD NO.:  [id number]  LOCATION:  [id number]                         FACILITY:  [facility]  PHYSICIAN:  Lottie Dawson, MD       DATE OF BIRTH:  1933-10-22  DATE OF ADMISSION:  02/08/2011 DATE OF DISCHARGE:                             HISTORY & PHYSICAL   PRIMARY CARE PHYSICIAN:  Darlin Coco, M.D.  ORTHOPEDIC PHYSICIAN:  Susa Day, M.D.  CHIEF COMPLAINT:  Left elbow pain.  HISTORY OF PRESENT ILLNESS:  Mr. Spross is a 77 year old Caucasian male with history of coronary artery disease, hypertension, type 2 diabetes, erectile dysfunction, hyperlipidemia, BPH, spinal stenosis, status post surgery, who comes in complaining about left elbow pain. Reports that his symptoms started on February 05, 2011 when he was at [employer], he rubbed his left elbow against something.  Subsequently during the night of February 05, 2011, he had noted increased pain and swelling in that area.  The patient had gone to Urgent Care on February 06, 2011, and had received shot of antibiotic and was started on doxycycline.  He went back in again today and noted that his swelling was worse.  As a result, he was transferred from the Urgent Care to be admitted to the hospital.  He has felt warm at home but not has not checked his temperature at home.  Has been feeling feverish.  Denies any nausea, vomiting.  Denies any chest pain, shortness of breath.  Denies any abdominal pain, diarrhea, headaches, or vision changes.  REVIEW OF SYSTEMS:  All systems were reviewed with the patient was positive as per HPI, otherwise all other systems are negative.  PAST MEDICAL HISTORY: 1. Hypertension. 2. Coronary artery disease, status post CABG. 3. History of lumbar decompression of L2. 4. History of spinal stenosis, with residual neuropathy in legs     bilaterally. 5. History of BPH. 6. Hyperlipidemia. 7. Type 2 diabetes. 8. History of erectile  dysfunction. 9. CKD stage III baseline creatinine of  2.0  SOCIAL HISTORY:  The patient does not smoke, has quit about 61 years ago.  Denies any illegal drugs or substances.  Does not drink any alcohol.  Lives at home with wife.  FAMILY HISTORY:  Had a father ho died from heart problems.  Had a mother who died from lung cancer.  Has four brothers with heart problems.  HOME MEDICATIONS: 1. Cialis 20 mg p.o. daily as needed for erectile dysfunction. 2. Aspirin 81 mg p.o. q.a.m. 3. Spironolactone 25 mg p.o. q.a.m. 4. Latanoprost 0.005% ophthalmic solution, both eyes daily at bedtime. 5. Metoprolol XL 25 mg p.o. q.a.m. 6. Multivitamin 1 tablet p.o. daily. 7. Vitamin B complex 1 tablet p.o. daily 8. Vitamin D3 1000 units to tablets p.o. daily 9. Benadryl 25 mg p.o. q.6 h as needed. 10.Tylenol extra strength 500 mg, 2 tablets every 12 hours as needed     for pain. 11.Aleve 220 mg, 2 to 3 tablets daily as needed. 12.Metformin 500 mg p.o. daily. 13.Pulmicort 1 drop nasally daily. 14.Nasacort AQ 1 spray daily 15.Lipitor 10 mg p.o. q.a.m. 16.Amlodipine 10 mg p.o. q.a.m. 17.Valsartan/hydrochlorothiazide 320/12.5, one tablet p.o. q.a.m. 18.Amlodipine 5 mg p.o.  q.a.m. 19.Niaspan 1000 mg, 2 tablets p.o. daily.  PHYSICAL EXAMINATION:  VITAL SIGNS:  Temperature is 98.1, pulse is 107, respirations 20, blood pressure 148/80, satting at 96% on room air. GENERAL:  The patient is alert, oriented, did not appear to be in acute distress, who was lying in bed comfortably. HEENT:  Extraocular motions are intact.  Pupils equal, round.  Had moist mucous membranes. NECK:  Supple. HEART:  Regular with S1-S2. LUNGS:  Clear to auscultation bilaterally. ABDOMEN:  Soft, nontender, nondistended.  Positive bowel sounds. NEURO:  Cranial nerves II through XII grossly intact, have 5/5 motor strength in upper as well as lower extremities. EXTREMITIES:  The patient had good peripheral pulses with trace  edema. The patient had generalized swelling over the left elbow that extends midway from the elbow and the shoulder. LYMPH NODES:  No lymphadenopathy was palpable in the axillary region.  LABS: CBC with a white count of 11.5, hemoglobin 11.8, hematocrit 35.1, platelet count 134,000, BUN 36, creatinine 2.08.  ASSESSMENT/PLAN: 1. Left elbow cellulitis.  Will start the patient on vancomycin and     Unasyn, further titration of antibiotics depending on the clinical     course.  Will hold the doxycycline for now.  Will get an ultrasound     of the left upper extremity to determine to see if there is any     DVT that could be causing the symptoms.  Dr. Tonita Cong from orthopedic     service will be consulted for consideration of joint tap.     The patient may have olecranon bursitis that could be     causing the swelling and pain in the elbow.  The patient had tap of     his elbow done yesterday, February 07, 2011 at Urgent Care; however, did     not have results of that available. If symptoms don't improve     consider getting an MRI for further evaluation. 2. Leukocytosis, likely due to the left elbow cellulitis. 3. Hypertension, stable.  Continue the patient on home medications     with whole parameters. 4. History of coronary artery disease, stable.  Continue the patient     on home medications as well as aspirin. 5. History of hyperlipidemia.  Continue the patient on statin and home     medications. 6. Type 2 diabetes.  Will hold metformin.  Will have him monitored     through sliding-scale insulin. 7. CKD stage III. Stable. Monitor for now. 8. Mild tachycardia likely due to pain over the left elbow.  Monitor     for now.  Will gently hydrate him with fluids. 9. Prophylaxis.  Lovenox for DVT prophylaxis. 10.Code status.  The patient is full code. Discussed with patient     and wife at the time of admission.  Time spent on admission talking to the patient and coordinating care was 1  hour.   Lottie Dawson, MD   SR/MEDQ  D:  02/08/2011  T:  02/08/2011  Job:  [id number]  Electronically Signed by Alveta Heimlich Yani [employer]  on 02/08/2011 08:41:28 PM

## 2011-02-09 LAB — C-REACTIVE PROTEIN: CRP: 21.4 mg/dL — ABNORMAL HIGH (ref <0.6)

## 2011-02-09 LAB — BASIC METABOLIC PANEL
CO2: 23 mEq/L (ref 19–32)
Chloride: 107 mEq/L (ref 96–112)
Sodium: 139 mEq/L (ref 135–145)

## 2011-02-09 LAB — GLUCOSE, CAPILLARY
Glucose-Capillary: 155 mg/dL — ABNORMAL HIGH (ref 70–99)
Glucose-Capillary: 109 mg/dL — ABNORMAL HIGH (ref 70–99)
Glucose-Capillary: 139 mg/dL — ABNORMAL HIGH (ref 70–99)
Glucose-Capillary: 129 mg/dL — ABNORMAL HIGH (ref 70–99)

## 2011-02-09 LAB — CBC
Platelets: 135 10*3/uL — ABNORMAL LOW (ref 150–400)
RBC: 3.91 MIL/uL — ABNORMAL LOW (ref 4.22–5.81)
WBC: 10.2 10*3/uL (ref 4.0–10.5)

## 2011-02-10 LAB — CBC
HCT: 35 % — ABNORMAL LOW (ref 39.0–52.0)
MCH: 29.7 pg (ref 26.0–34.0)
MCHC: 32.9 g/dL (ref 30.0–36.0)
RDW: 13.8 % (ref 11.5–15.5)

## 2011-02-10 LAB — BASIC METABOLIC PANEL
BUN: 29 mg/dL — ABNORMAL HIGH (ref 6–23)
Calcium: 8.4 mg/dL (ref 8.4–10.5)
GFR calc Af Amer: 51 mL/min — ABNORMAL LOW (ref >60)
GFR calc non Af Amer: 42 mL/min — ABNORMAL LOW (ref >60)
Potassium: 3.7 mEq/L (ref 3.5–5.1)
Sodium: 134 mEq/L — ABNORMAL LOW (ref 135–145)

## 2011-02-10 LAB — GLUCOSE, CAPILLARY

## 2011-02-11 LAB — CBC
HCT: 33.8 % — ABNORMAL LOW (ref 39.0–52.0)
Hemoglobin: 10.9 g/dL — ABNORMAL LOW (ref 13.0–17.0)
MCV: 89.7 fL (ref 78.0–100.0)
RDW: 13.7 % (ref 11.5–15.5)
WBC: 7.1 10*3/uL (ref 4.0–10.5)

## 2011-02-11 LAB — BASIC METABOLIC PANEL
BUN: 22 mg/dL (ref 6–23)
CO2: 22 mEq/L (ref 19–32)
Chloride: 103 mEq/L (ref 96–112)
Creatinine, Ser: 1.48 mg/dL — ABNORMAL HIGH (ref 0.50–1.35)
Glucose, Bld: 137 mg/dL — ABNORMAL HIGH (ref 70–99)
Potassium: 3.5 mEq/L (ref 3.5–5.1)

## 2011-02-11 LAB — GLUCOSE, CAPILLARY
Glucose-Capillary: 133 mg/dL — ABNORMAL HIGH (ref 70–99)
Glucose-Capillary: 101 mg/dL — ABNORMAL HIGH (ref 70–99)

## 2011-02-12 LAB — BASIC METABOLIC PANEL
BUN: 17 mg/dL (ref 6–23)
Calcium: 9 mg/dL (ref 8.4–10.5)
Chloride: 105 mEq/L (ref 96–112)
Creatinine, Ser: 1.5 mg/dL — ABNORMAL HIGH (ref 0.50–1.35)
GFR calc Af Amer: 55 mL/min — ABNORMAL LOW (ref >60)

## 2011-02-12 LAB — CBC
HCT: 35.7 % — ABNORMAL LOW (ref 39.0–52.0)
MCH: 29 pg (ref 26.0–34.0)
MCV: 89.9 fL (ref 78.0–100.0)
Platelets: 135 10*3/uL — ABNORMAL LOW (ref 150–400)
RDW: 13.8 % (ref 11.5–15.5)

## 2011-02-12 LAB — GLUCOSE, CAPILLARY
Glucose-Capillary: 152 mg/dL — ABNORMAL HIGH (ref 70–99)
Glucose-Capillary: 133 mg/dL — ABNORMAL HIGH (ref 70–99)
Glucose-Capillary: 110 mg/dL — ABNORMAL HIGH (ref 70–99)

## 2011-02-13 DIAGNOSIS — M7020 Olecranon bursitis, unspecified elbow: Secondary | ICD-10-CM

## 2011-02-13 LAB — CBC
MCH: 29 pg (ref 26.0–34.0)
MCHC: 32.4 g/dL (ref 30.0–36.0)
MCV: 89.5 fL (ref 78.0–100.0)
Platelets: 162 10*3/uL (ref 150–400)
RBC: 3.72 MIL/uL — ABNORMAL LOW (ref 4.22–5.81)
RDW: 13.8 % (ref 11.5–15.5)

## 2011-02-13 LAB — BASIC METABOLIC PANEL
CO2: 24 mEq/L (ref 19–32)
Calcium: 8.8 mg/dL (ref 8.4–10.5)
Creatinine, Ser: 1.47 mg/dL — ABNORMAL HIGH (ref 0.50–1.35)

## 2011-02-13 LAB — GLUCOSE, CAPILLARY
Glucose-Capillary: 106 mg/dL — ABNORMAL HIGH (ref 70–99)
Glucose-Capillary: 169 mg/dL — ABNORMAL HIGH (ref 70–99)
Glucose-Capillary: 127 mg/dL — ABNORMAL HIGH (ref 70–99)

## 2011-02-13 LAB — GRAM STAIN: Gram Stain: NONE SEEN

## 2011-02-14 DIAGNOSIS — M7020 Olecranon bursitis, unspecified elbow: Secondary | ICD-10-CM

## 2011-02-14 DIAGNOSIS — M009 Pyogenic arthritis, unspecified: Secondary | ICD-10-CM

## 2011-02-14 LAB — GLUCOSE, CAPILLARY
Glucose-Capillary: 109 mg/dL — ABNORMAL HIGH (ref 70–99)
Glucose-Capillary: 106 mg/dL — ABNORMAL HIGH (ref 70–99)

## 2011-02-14 LAB — BASIC METABOLIC PANEL
CO2: 23 mEq/L (ref 19–32)
Chloride: 103 mEq/L (ref 96–112)
Sodium: 136 mEq/L (ref 135–145)

## 2011-02-16 LAB — BODY FLUID CULTURE

## 2011-02-17 LAB — ANAEROBIC CULTURE

## 2011-02-17 NOTE — Discharge Summary (Signed)
NAMEMarland Kitchen  Shane, [name]              ACCOUNT NO.:  [id number]  MEDICAL RECORD NO.:  [id number]  LOCATION:  [id number]                         FACILITY:  [facility]  PHYSICIAN:  Shane Poisson, MD       DATE OF BIRTH:  October 10, 1933  DATE OF ADMISSION:  02/08/2011 DATE OF DISCHARGE:  02/14/2011                              DISCHARGE SUMMARY   PRIMARY CARE PHYSICIAN:  Shane [name], M.D.  ORTHOPEDIC PHYSICIAN:  Shane [name], M.D.  INFECTIOUS DISEASE:  Shane [name], M.D.  DISCHARGE DIAGNOSES: 1. Left elbow cellulitis and left elbow septic bursitis. 2. Hypertension. 3. Coronary artery disease, status post coronary artery bypass graft. 4. Benign prostatic hypertrophy. 5. Hyperlipidemia. 6. Type 2 diabetes. 7. Stage 3 chronic kidney disease. 8. History of spinal stenosis. 9. History of lumbar decompression.  DISCHARGE MEDICATIONS: 1. Ancef 2 g q.12h. for 3 weeks. 2. Colace 100 mg twice daily. 3. Senna 2 tablets daily at bedtime. 4. Aspirin 81 mg daily. 5. Benadryl 25 mg q.6h. as needed. 6. Tadalafil 20 mg daily as needed. 7. Latanoprost ophthalmic solution daily at bedtime. 8. Lipitor 10 mg daily. 9. Metoprolol 50 mg half a tablet daily. 10.Multivitamin one tablet daily. 11.Triamcinolone nasal spray daily. 12.Niacin SR 1000 mg 2 times daily. 13.Amlodipine 10 mg daily. 14.Spironolactone 25 mg 1 tablet daily. 15.Tylenol 500 mg 2 tablets every 12 hours. 16.Vitamin B complex 1 tablet daily. 17.Vitamin D3 2 tablets daily. 18.Zolpidem 5 mg 1 tablet daily.  CONSULTATION: 1. Orthopedic consult from Dr. Veverly [name]. 2. Infectious disease consult from Dr. Johnnye [name].  PROCEDURES:  None.  PERTINENT LABORATORY DATA:  On admission, the patient had a CBC which was significant for WBC count of 11.5, hemoglobin of 11.8, hematocrit of 35.1, platelets of 134,000.  Basic metabolic panel significant for creatinine of 2.08, BUN of 36, glucose of 118, ESR of 45, C-reactive protein of 21.  Uric  acid was 6.5.  Cultures of the body fluid showed rare staph aureus, anaerobic cultures negative Gram stain, no organism seen.  On the day of discharge, the patient's basic metabolic panel showed an improvement of creatinine from 2 to 1.57 and a glucose of 126. CBC showed a hemoglobin of 10.8 and hematocrit of 33.3.  RADIOLOGY:  None.  BRIEF HOSPITAL COURSE:  This 77 year old gentleman with history of coronary artery disease, hypertension, type 2 diabetes, hyperlipidemia, came in for left elbow pain.  He was found to have left elbow cellulitis.  The patient was on outpatient p.o. antibiotic therapy with doxycycline.  The doxycycline was stopped.  He was started on IV antibiotics and ultrasound of the left upper extremity was done to rule out DVT, which came back negative for DVT.  Orthopedic consult was called to aspirate elbow joint and also to rule out septic bursitis. The patient underwent aspiration of the elbow joint and the cultures were sent which came back showing to have staph aureus.  At the same time, infectious disease was called from Dr. Johnnye [name], recommended IV Ancef for 3 weeks.  The patient is being discharged at home with home RN and will get 3 weeks of IV Ancef.  He will also get a CBC and a BMP weekly and  will follow up with Dr. Johnnye [name] in about 2 weeks.  Hypertension, controlled.  Coronary artery disease, stable.  Type 2 diabetes.  Metformin was held secondary to chronic renal insufficiency.  He is on sliding scale insulin.  CKD stage 3, CKD stable, creatinine has actually improved to 1.5.  His Ace inhibitor and metformin are held and recommended to follow up with BMP weekly.  PHYSICAL EXAMINATION:  VITAL SIGNS:  On the day of discharge, the patient's vitals include temperature of 98, pulse of 95, respirations 16, blood pressure 150/84, saturating 96 on room air. GENERAL:  He is alert, afebrile, oriented x3, comfortable, in no acute distress. CARDIOVASCULAR:   S1 and S2 heard. RESPIRATORY:  Clear to auscultation bilaterally. ABDOMEN:  Soft, nontender, nondistended.  Bowel sounds are heard. EXTREMITIES:  The patient has bandage of the left upper extremity.  FOLLOW UP:  Follow up with PCP in about 1 to 2 weeks.  Follow up with Dr. Veverly [name] in 2 weeks.  Follow up with Dr. Johnnye [name] as needed.          ______________________________ Shane Poisson, MD     VA/MEDQ  D:  02/15/2011  T:  02/16/2011  Job:  [id number]  Electronically Signed by Shane Poisson MD on 02/17/2011 07:59:29 AM

## 2011-02-18 ENCOUNTER — Telehealth: Payer: Self-pay | Admitting: Cardiology

## 2011-02-18 DIAGNOSIS — N289 Disorder of kidney and ureter, unspecified: Secondary | ICD-10-CM

## 2011-02-18 NOTE — Telephone Encounter (Signed)
Advised patient and scheduled office visit

## 2011-02-18 NOTE — Telephone Encounter (Signed)
Called in having some questions about Diaovan and Metformin. While he was in the hospital the doctors wanted to take him off of these medications. Please call back. I have pulled his chart.

## 2011-02-18 NOTE — Telephone Encounter (Signed)
Please advise 

## 2011-02-18 NOTE — Telephone Encounter (Signed)
Okay to leave him off the metformin and the Diovan since he does have some degree of renal insufficiency.

## 2011-02-27 DIAGNOSIS — N400 Enlarged prostate without lower urinary tract symptoms: Secondary | ICD-10-CM | POA: Insufficient documentation

## 2011-02-27 DIAGNOSIS — L03119 Cellulitis of unspecified part of limb: Secondary | ICD-10-CM

## 2011-02-27 DIAGNOSIS — I2510 Atherosclerotic heart disease of native coronary artery without angina pectoris: Secondary | ICD-10-CM

## 2011-02-27 DIAGNOSIS — M4800 Spinal stenosis, site unspecified: Secondary | ICD-10-CM

## 2011-02-27 DIAGNOSIS — N184 Chronic kidney disease, stage 4 (severe): Secondary | ICD-10-CM | POA: Insufficient documentation

## 2011-03-01 ENCOUNTER — Emergency Department (HOSPITAL_COMMUNITY)
Admission: EM | Admit: 2011-03-01 | Discharge: 2011-03-01 | Disposition: A | Discharge status: Home / Self Care | Payer: Medicare Other | Attending: Emergency Medicine | Admitting: Emergency Medicine

## 2011-03-01 DIAGNOSIS — E119 Type 2 diabetes mellitus without complications: Secondary | ICD-10-CM | POA: Insufficient documentation

## 2011-03-01 DIAGNOSIS — I10 Essential (primary) hypertension: Secondary | ICD-10-CM | POA: Insufficient documentation

## 2011-03-01 DIAGNOSIS — T82898A Other specified complication of vascular prosthetic devices, implants and grafts, initial encounter: Secondary | ICD-10-CM | POA: Insufficient documentation

## 2011-03-01 DIAGNOSIS — E785 Hyperlipidemia, unspecified: Secondary | ICD-10-CM | POA: Insufficient documentation

## 2011-03-01 DIAGNOSIS — Z79899 Other long term (current) drug therapy: Secondary | ICD-10-CM | POA: Insufficient documentation

## 2011-03-01 DIAGNOSIS — Z7982 Long term (current) use of aspirin: Secondary | ICD-10-CM | POA: Insufficient documentation

## 2011-03-01 DIAGNOSIS — Y849 Medical procedure, unspecified as the cause of abnormal reaction of the patient, or of later complication, without mention of misadventure at the time of the procedure: Secondary | ICD-10-CM | POA: Insufficient documentation

## 2011-03-05 ENCOUNTER — Telehealth: Payer: Self-pay | Admitting: *Deleted

## 2011-03-05 ENCOUNTER — Encounter: Payer: Self-pay | Admitting: Infectious Diseases

## 2011-03-05 NOTE — Telephone Encounter (Signed)
Jacqlyn Larsen, rn called from [facility]. [phone]. Wants to have order to pull PICC Thursday when his antibiotics are done. States she has paged the md but has not heard back yet. Told her we will send a message as well, but to page him again. Pt has not been seen in office yet. His 1st appt is 03/18/11

## 2011-03-18 ENCOUNTER — Ambulatory Visit (INDEPENDENT_AMBULATORY_CARE_PROVIDER_SITE_OTHER): Payer: Medicare Other | Admitting: Infectious Diseases

## 2011-03-18 ENCOUNTER — Encounter: Payer: Self-pay | Admitting: Infectious Diseases

## 2011-03-18 DIAGNOSIS — L03119 Cellulitis of unspecified part of limb: Secondary | ICD-10-CM

## 2011-03-18 DIAGNOSIS — IMO0002 Reserved for concepts with insufficient information to code with codable children: Secondary | ICD-10-CM

## 2011-03-18 NOTE — Progress Notes (Signed)
  Subjective:    Patient ID: Shane [name], male    DOB: 11-03-1933, 77 y.o.   MRN: [id number]  HPI 77 yo M with hx of coronary artery disease, hypertension, type 2 diabetes, hyperlipidemia,  came in for left elbow pain. He was admitted to the hospital 02-08-11 to 02-03-11 for L elbow cellulitis. He had failed outpatient doxy prior to arrival. He was started on IV antibiotics and ultrasound of the left upper extremity was done to rule  out DVT (negative). Orthopedic consult aspirated the elbow joint and the cultures came back showing MSSA (R-flouroquinolones, PEN). His anbx were changed to Ancef for 3 weeks. He was d/c home to complete his Anbx via [facility] Feels like his elbow is doing "pretty good". Completed his anbx on 03-07-11. His range of motion in his elbow is normal, he some mild tenderness with touching his epicondyle and with motion of the elbow. He has had f/u with ortho and has been released.    Review of Systems  Constitutional: Negative for fever and chills.  Gastrointestinal: Negative for diarrhea and constipation.  Genitourinary: Negative for dysuria.       Objective:   Physical Exam  Constitutional: He appears well-developed and well-nourished.  Eyes: EOM are normal.  Cardiovascular: Normal rate, regular rhythm and normal heart sounds.   Pulmonary/Chest: Effort normal and breath sounds normal.  Abdominal: Soft. Bowel sounds are normal. He exhibits no distension. There is no tenderness.  Musculoskeletal:       He has mild tenderness in his L elbow with extension. There is no fluctuance, effusion, and there is minimal erythema. There is no increase in heat/warmth in his elbow.           Assessment & Plan:

## 2011-03-18 NOTE — Assessment & Plan Note (Signed)
He is doing very well. I would recommend that he continue to monitor the joint off anbx. I recommended that he f/u with orthopedics if he has continued pain in his elbow. Could consider getting a MRI at that point. I asked that he call me if he has any fever or chills, swelling, increase in heat, or other changes in his elbow. Otherwise, he will rtc prn.

## 2011-03-25 ENCOUNTER — Encounter: Payer: Self-pay | Admitting: Cardiology

## 2011-03-25 ENCOUNTER — Ambulatory Visit (INDEPENDENT_AMBULATORY_CARE_PROVIDER_SITE_OTHER): Payer: Medicare Other | Admitting: *Deleted

## 2011-03-25 ENCOUNTER — Ambulatory Visit (INDEPENDENT_AMBULATORY_CARE_PROVIDER_SITE_OTHER): Payer: Medicare Other | Admitting: Cardiology

## 2011-03-25 VITALS — BP 160/90 | HR 85 | Ht 72.0 in | Wt 211.0 lb

## 2011-03-25 DIAGNOSIS — N400 Enlarged prostate without lower urinary tract symptoms: Secondary | ICD-10-CM

## 2011-03-25 DIAGNOSIS — N183 Chronic kidney disease, stage 3 unspecified: Secondary | ICD-10-CM

## 2011-03-25 DIAGNOSIS — E119 Type 2 diabetes mellitus without complications: Secondary | ICD-10-CM

## 2011-03-25 DIAGNOSIS — I2510 Atherosclerotic heart disease of native coronary artery without angina pectoris: Secondary | ICD-10-CM

## 2011-03-25 DIAGNOSIS — I119 Hypertensive heart disease without heart failure: Secondary | ICD-10-CM

## 2011-03-25 DIAGNOSIS — I259 Chronic ischemic heart disease, unspecified: Secondary | ICD-10-CM

## 2011-03-25 DIAGNOSIS — M1990 Unspecified osteoarthritis, unspecified site: Secondary | ICD-10-CM

## 2011-03-25 DIAGNOSIS — IMO0002 Reserved for concepts with insufficient information to code with codable children: Secondary | ICD-10-CM

## 2011-03-25 DIAGNOSIS — L03119 Cellulitis of unspecified part of limb: Secondary | ICD-10-CM

## 2011-03-25 DIAGNOSIS — N289 Disorder of kidney and ureter, unspecified: Secondary | ICD-10-CM

## 2011-03-25 LAB — BASIC METABOLIC PANEL
GFR: 49.55 mL/min — ABNORMAL LOW (ref >60.00)
Potassium: 5 mEq/L (ref 3.5–5.1)
Sodium: 138 mEq/L (ref 135–145)

## 2011-03-25 MED ORDER — VALSARTAN-HYDROCHLOROTHIAZIDE 320-12.5 MG PO TABS: 1.0000 | ORAL_TABLET | Freq: Every day | ORAL | Status: DC

## 2011-03-25 NOTE — Assessment & Plan Note (Signed)
No recent chest pain or angina.  No recent symptoms of congestive heart failure

## 2011-03-25 NOTE — Assessment & Plan Note (Signed)
His blood pressure has not been as well controlled recently.  We will plan to restart his Diovan HCT

## 2011-03-25 NOTE — Progress Notes (Signed)
Shane [name] Date of Birth:  March 17, 1934 [facility] [id number]   [place], [place]  [address] [phone]           Fax   [phone]  History of Present Illness: This pleasant 77 year old gentleman is seen for a scheduled 4 month followup office visit.  He has a history of known ischemic heart disease.  He had coronary artery bypass graft surgery in 1985.  He underwent cardiac catheterization in 2003 by Dr. Lia Foyer and he did not require percutaneous intervention at that time.  He had a nuclear stress test on 06/16/09 which showed moderate area of infarct in the inferior wall with only minimal reversibility and his ejection fraction was 28%.  His ejection fraction by echocardiogram however in November of 2010 was 35-40% since we last saw him he has been hospitalized at [facility] with an infection of his elbow.  He was followed by Dr. Johnnye Sima of the infectious disease department.  He turned out to have a MSSA infection which was treated with 3 weeks of intravenous Ancef given through a PIC line at home.The patient has not been having any recurrent chest pain or angina.  He is having a lot of difficulty with arthritis of his right knee but intends to hold off on any surgery on his knee with his orthopedist Dr. Tonita Cong until later in the year or possibly next year.  The patient has been noticing that his blood pressure has been higher since he left the hospital.  In the hospital he was taken off his metformin and his Diovan HCT  Current Outpatient Prescriptions  Medication Sig Dispense Refill  . amLODipine (NORVASC) 10 MG tablet TAKE 1 TABLET DAILY  90 tablet  2  . aspirin 81 MG tablet Take 81 mg by mouth daily.        Marland Kitchen atorvastatin (LIPITOR) 10 MG tablet Take 10 mg by mouth daily.        . calcium citrate-vitamin D (CITRACAL+D) 315-200 MG-UNIT per tablet Take 1 tablet by mouth 2 (two) times daily.        . cetirizine (ZYRTEC) 10 MG tablet Take 10  mg by mouth daily.        . Cholecalciferol (VITAMIN D3) 1000 UNITS CAPS 1,000 mg.        . latanoprost (XALATAN) 0.005 % ophthalmic solution 1 drop at bedtime. As directed       . metoprolol (TOPROL-XL) 50 MG 24 hr tablet Take 50 mg by mouth. Take one half daily       . MULTIPLE VITAMINS PO Take by mouth. With added b complex       . NIASPAN 1000 MG CR tablet TAKE 2 TABLETS AT BEDTIME  180 tablet  2  . spironolactone (ALDACTONE) 25 MG tablet TAKE ONE TABLET BY MOUTH EVERY DAY  30 tablet  11  . tadalafil (CIALIS) 20 MG tablet Take 20 mg by mouth daily as needed.        . valsartan-hydrochlorothiazide (DIOVAN HCT) 320-12.5 MG per tablet Take 1 tablet by mouth daily.        Allergies  Allergen Reactions  . Azithromycin     Patient Active Problem List  Diagnoses  . Ischemic heart disease  . Dyslipidemia  . Hyperkalemia  . Hypercholesterolemia  . Diabetes mellitus  . Benign hypertensive heart disease without heart failure  . Allergic rhinitis  . Osteoarthritis  . Cellulitis of left elbow and left elbow  septic bursitis  . CAD s/p coronary arthery bypass graft  . BPH (benign prostatic hyperplasia)  . Kidney disease, chronic, stage III (GFR 30-59 ml/min)  . H/O Spinal stenosis    History  Smoking status  . Former Smoker  Smokeless tobacco  . Never Used    History  Alcohol Use No    No family history on file.  Review of Systems: Constitutional: no fever chills diaphoresis or fatigue or change in weight.  Head and neck: no hearing loss, no epistaxis, no photophobia or visual disturbance. Respiratory: No cough, shortness of breath or wheezing. Cardiovascular: No chest pain peripheral edema, palpitations. Gastrointestinal: No abdominal distention, no abdominal pain, no change in bowel habits hematochezia or melena. Genitourinary: No dysuria, no frequency, no urgency, no nocturia. Musculoskeletal:No arthralgias, no back pain, no gait disturbance or myalgias. Neurological: No  dizziness, no headaches, no numbness, no seizures, no syncope, no weakness, no tremors. Hematologic: No lymphadenopathy, no easy bruising. Psychiatric: No confusion, no hallucinations, no sleep disturbance.    Physical Exam: Filed Vitals:   03/25/11 1513  BP: 160/90  Pulse: 85   The general appearance reveals a well-developed well-nourished gentleman in no distress.Pupils equal and reactive.   Extraocular Movements are full.  There is no scleral icterus.  The mouth and pharynx are normal.  The neck is supple.  The carotids reveal no bruits.  The jugular venous pressure is normal.  The thyroid is not enlarged.  There is no lymphadenopathy.  The chest is clear to percussion and auscultation. There are no rales or rhonchi. Expansion of the chest is symmetrical.  The precordium is quiet.  The first heart sound is normal.  The second heart sound is physiologically split.  There is no murmur gallop rub or click.  There is no abnormal lift or heave.  The abdomen is soft and nontender. Bowel sounds are normal. The liver and spleen are not enlarged. There Are no abdominal masses. There are no bruits.  The pedal pulses are good.  There is no phlebitis or edema.  There is no cyanosis or clubbing.  Strength is normal and symmetrical in all extremities.  There is no lateralizing weakness.  There are no sensory deficits.  The skin is warm and dry.  There is no rash.    Assessment / Plan:  Continue same medication except restart Diovan HCT 320/12.5 one daily.  Recheck in 3 months for followup office visit and lab work.

## 2011-03-25 NOTE — Assessment & Plan Note (Signed)
The patient has not been having any fever or chills since his intravenous antibiotics have been stopped.

## 2011-04-01 ENCOUNTER — Telehealth: Payer: Self-pay | Admitting: *Deleted

## 2011-04-01 NOTE — Telephone Encounter (Signed)
Message copied by Deniece Ree on Mon Apr 01, 2011  4:30 PM ------      Message from: Darlin Coco      Created: Tue Mar 26, 2011 10:10 AM       Please report.The blood sugar is higher at 181.  The BUN is elevated at 30 but the creatinine is better at 1.5.  He needs to drink more water.Restart metformin 500 mg once a day in the morning.  Return in 2 weeks for a followup basal metabolic panel.

## 2011-04-01 NOTE — Progress Notes (Signed)
Re-check bmp in 2 weeks

## 2011-04-01 NOTE — Telephone Encounter (Signed)
Advised patient of labs and to start back on metformin 500mg  daily,patient will calll in 2 weeks to sch lab recheck bmet

## 2011-04-30 LAB — BASIC METABOLIC PANEL
BUN: 18
Chloride: 106
Creatinine, Ser: 1.12
GFR calc non Af Amer: >60
Glucose, Bld: 116 — ABNORMAL HIGH

## 2011-04-30 LAB — URINALYSIS, ROUTINE W REFLEX MICROSCOPIC
Leukocytes, UA: NEGATIVE
Nitrite: NEGATIVE
Specific Gravity, Urine: 1.018
pH: 6

## 2011-04-30 LAB — HEMOGLOBIN AND HEMATOCRIT, BLOOD: Hemoglobin: 14.8

## 2011-04-30 LAB — URINE MICROSCOPIC-ADD ON

## 2011-05-02 LAB — POCT CARDIAC MARKERS: Operator id: 272551

## 2011-05-02 LAB — POCT I-STAT, CHEM 8
BUN: 24 — ABNORMAL HIGH
Calcium, Ion: 1.07 — ABNORMAL LOW
HCT: 41
Hemoglobin: 13.9
Sodium: 136
TCO2: 21

## 2011-05-02 LAB — URINALYSIS, ROUTINE W REFLEX MICROSCOPIC
Glucose, UA: NEGATIVE
Ketones, ur: NEGATIVE
pH: 7

## 2011-05-02 LAB — DIFFERENTIAL
Basophils Absolute: 0
Basophils Relative: 0
Eosinophils Absolute: 0.4
Eosinophils Relative: 5
Monocytes Absolute: 0.5

## 2011-05-02 LAB — CBC
HCT: 41.4
Hemoglobin: 14.1
MCHC: 34.1
MCV: 92.5
Platelets: 133 — ABNORMAL LOW
RDW: 13.2

## 2011-05-02 LAB — URINE MICROSCOPIC-ADD ON

## 2011-05-08 ENCOUNTER — Telehealth: Payer: Self-pay | Admitting: *Deleted

## 2011-05-08 NOTE — Telephone Encounter (Signed)
I left message for rn to call me

## 2011-05-08 NOTE — Telephone Encounter (Signed)
Has pic been pulled?

## 2011-05-08 NOTE — Telephone Encounter (Signed)
The PICC was pulled already per rn

## 2011-05-14 ENCOUNTER — Ambulatory Visit: Payer: Medicare Other | Admitting: Cardiology

## 2011-05-15 ENCOUNTER — Other Ambulatory Visit: Payer: Medicare Other | Admitting: *Deleted

## 2011-05-20 ENCOUNTER — Ambulatory Visit: Payer: Medicare Other | Admitting: Cardiology

## 2011-05-27 ENCOUNTER — Other Ambulatory Visit: Payer: Self-pay | Admitting: Cardiology

## 2011-05-27 DIAGNOSIS — I10 Essential (primary) hypertension: Secondary | ICD-10-CM

## 2011-05-27 MED ORDER — AMLODIPINE BESYLATE 10 MG PO TABS: 10.0000 mg | ORAL_TABLET | Freq: Every day | ORAL | Status: DC

## 2011-05-28 ENCOUNTER — Ambulatory Visit: Payer: Medicare Other | Admitting: Nurse Practitioner

## 2011-05-29 ENCOUNTER — Encounter: Payer: Self-pay | Admitting: Nurse Practitioner

## 2011-05-29 ENCOUNTER — Ambulatory Visit (INDEPENDENT_AMBULATORY_CARE_PROVIDER_SITE_OTHER): Payer: Medicare Other | Admitting: Nurse Practitioner

## 2011-05-29 VITALS — BP 158/78 | HR 82 | Ht 71.0 in | Wt 211.8 lb

## 2011-05-29 DIAGNOSIS — Z01818 Encounter for other preprocedural examination: Secondary | ICD-10-CM

## 2011-05-29 DIAGNOSIS — I2510 Atherosclerotic heart disease of native coronary artery without angina pectoris: Secondary | ICD-10-CM

## 2011-05-29 NOTE — Patient Instructions (Signed)
We are going to arrange for a repeat stress test.  I will have Dr. Mare Ferrari review your stress test and then we will decide about proceeding on with your knee surgery.  Stay on your current medicines.

## 2011-05-29 NOTE — Assessment & Plan Note (Signed)
He is doing well clinically. Needs preoperative clearance for TKR. We will need to update his myoview. Further disposition to follow. Patient is agreeable to this plan and will call if any problems develop in the interim.

## 2011-05-29 NOTE — Progress Notes (Signed)
Shane [name] Date of Birth: [birth year] Medical Record [id number]  History of Present Illness: Shane [name] is seen today for a pre operative clearance. He is seen for Dr. Mare Ferrari. He has a known ischemic cardiomyopathy. He is doing well clinically. No chest pain or shortness of breath. He is tolerating his medicines. Sugars are up and down. Blood pressure is better at home. He had run out of his Norvasc for about 4 days and just got back on it yesterday. He is anxious to proceed with total knee replacement at the end of December with Dr. Tonita Cong.   Current Outpatient Prescriptions on File Prior to Visit  Medication Sig Dispense Refill  . amLODipine (NORVASC) 10 MG tablet Take 1 tablet (10 mg total) by mouth daily.  90 tablet  2  . aspirin 81 MG tablet Take 81 mg by mouth daily.        Marland Kitchen atorvastatin (LIPITOR) 10 MG tablet Take 10 mg by mouth daily.        Marland Kitchen b complex vitamins tablet Take 1 tablet by mouth daily.        . Cholecalciferol (VITAMIN D3) 1000 UNITS CAPS 1,000 mg.        . latanoprost (XALATAN) 0.005 % ophthalmic solution 1 drop at bedtime. As directed       . metFORMIN (GLUCOPHAGE) 500 MG tablet Take 500 mg by mouth daily.        . metoprolol (TOPROL-XL) 50 MG 24 hr tablet Take 25 mg by mouth daily.       . Multiple Vitamin (MULTIVITAMIN) tablet Take 1 tablet by mouth daily.        Marland Kitchen NIASPAN 1000 MG CR tablet TAKE 2 TABLETS AT BEDTIME  180 tablet  2  . spironolactone (ALDACTONE) 25 MG tablet TAKE ONE TABLET BY MOUTH EVERY DAY  30 tablet  11  . tadalafil (CIALIS) 20 MG tablet Take 20 mg by mouth daily as needed.        . valsartan-hydrochlorothiazide (DIOVAN HCT) 320-12.5 MG per tablet Take 1 tablet by mouth daily.        Allergies  Allergen Reactions  . Azithromycin     Past Medical History  Diagnosis Date  . Coronary artery disease     remote CABG in 1985, cath in 2003 by Dr. Lia Foyer with no PCI, last nuclear in 2010 showing scar and EF of 28%.   . Dyslipidemia   .  Hyperkalemia   . Hypercholesterolemia   . Left ventricular dysfunction     28% per nuclear in 2010 and 35 to 40% per echo in 2010  . HTN (hypertension)   . Diabetes mellitus   . Cellulitis of arm, left     MSSA  . CKD (chronic kidney disease), stage III   . Spinal stenosis   . BPH (benign prostatic hyperplasia)   . Allergic rhinitis   . Abnormal nuclear cardiac imaging test Nov 2010    moderate area of infarct in the inferior wall with only minimal reversibility and EF of 28%  . ED (erectile dysfunction)     Past Surgical History  Procedure Date  . Cardiac catheterization 2003  . Coronary artery bypass graft 1985  . Lumbar disc surgery     History  Smoking status  . Former Smoker  Smokeless tobacco  . Never Used    History  Alcohol Use No    Family History  Problem Relation Age of Onset  . Heart attack Father   .  Heart disease Father     Review of Systems: The review of systems is positive for rare swelling of the left ankle from prior trauma.  All other systems were reviewed and are negative.  Physical Exam: BP 158/78  Pulse 82  Ht 5\' 11"  (1.803 m)  Wt 211 lb 12.8 oz (96.072 kg)  BMI 29.54 kg/m2 Patient is very pleasant and in no acute distress. Skin is warm and dry. Color is normal.  HEENT is unremarkable. Normocephalic/atraumatic. PERRL. Sclera are nonicteric. Neck is supple. No masses. No JVD. Lungs are clear. Cardiac exam shows a regular rate and rhythm. No S3. Abdomen is soft. Extremities are without edema. Gait and ROM are intact. No gross neurologic deficits noted.  LABORATORY DATA: EKG with sinus, LBBB and unchanged.   Assessment / Plan:

## 2011-06-05 ENCOUNTER — Ambulatory Visit (HOSPITAL_COMMUNITY): Payer: Medicare Other | Attending: Nurse Practitioner | Admitting: Radiology

## 2011-06-05 ENCOUNTER — Encounter: Payer: Self-pay | Admitting: *Deleted

## 2011-06-05 VITALS — Ht 71.0 in | Wt 209.0 lb

## 2011-06-05 DIAGNOSIS — Z01818 Encounter for other preprocedural examination: Secondary | ICD-10-CM

## 2011-06-05 DIAGNOSIS — I491 Atrial premature depolarization: Secondary | ICD-10-CM

## 2011-06-05 DIAGNOSIS — I4949 Other premature depolarization: Secondary | ICD-10-CM

## 2011-06-05 DIAGNOSIS — Z01810 Encounter for preprocedural cardiovascular examination: Secondary | ICD-10-CM | POA: Insufficient documentation

## 2011-06-05 DIAGNOSIS — I447 Left bundle-branch block, unspecified: Secondary | ICD-10-CM

## 2011-06-05 MED ORDER — TECHNETIUM TC 99M TETROFOSMIN IV KIT
33.0000 | PACK | Freq: Once | INTRAVENOUS | Status: AC | PRN
  Administered 2011-06-05: 33 via INTRAVENOUS

## 2011-06-05 MED ORDER — ADENOSINE (DIAGNOSTIC) 3 MG/ML IV SOLN
0.5600 mg/kg | Freq: Once | INTRAVENOUS | Status: AC
  Administered 2011-06-05: 53.1 mg via INTRAVENOUS

## 2011-06-05 MED ORDER — TECHNETIUM TC 99M TETROFOSMIN IV KIT
11.0000 | PACK | Freq: Once | INTRAVENOUS | Status: AC | PRN
  Administered 2011-06-05: 11 via INTRAVENOUS

## 2011-06-05 NOTE — Progress Notes (Signed)
[facility] [place] [address] [phone]  Cardiology Nuclear Med Study  Shane [name] is a 77 y.o. male [id number] [birth year]   Nuclear Med Background Indication for Stress Test:  Evaluation for Ischemia and Surgical Clearance(Pre-op TKR with Dr. Tonita Cong) History:  Abnormal EKG,'85 CABG x5, '10 Echo: EF 35-45%, '03 Heart Catheterization: patent grafts and '10 Myocardial Perfusion Study: EF 28% mod. Area of inferior wall infarct-abn wall motion Cardiac Risk Factors: Family History - CAD, History of Smoking, Hypertension, LBBB, Lipids and NIDDM  Symptoms:  DOE, Palpitations and SOB   Nuclear Pre-Procedure Caffeine/Decaff Intake:  None NPO After: 8:00am   Lungs:  clear IV 0.9% NS with Angio Cath:  20g  IV Site: R Hand  IV Started by:  Crissie Figures, RN  Chest Size (in):  48 Cup Size: n/a  Height: 5\' 11"  (1.803 m)  Weight:  209 lb (94.802 kg)  BMI:  Body mass index is 29.15 kg/(m^2). Tech Comments:  Patient took meds except Toprol    Nuclear Med Study 1 or 2 day study: 1 day  Stress Test Type:  Adenosine  Reading MD: Glori Bickers, MD  Order Authorizing Provider:  Dr. Darlin Coco  Resting Radionuclide: Technetium 63m Tetrofosmin  Resting Radionuclide Dose: 11.0 mCi   Stress Radionuclide:  Technetium 57m Tetrofosmin  Stress Radionuclide Dose: 33.0 mCi           Stress Protocol Rest HR: 71 Stress HR: 91  Rest BP: 140/82 Stress BP: 147/80  Exercise Time (min): n/a METS: n/a   Predicted Max HR: 145 bpm % Max HR: 62.76 bpm Rate Pressure Product: 13377   Dose of Adenosine (mg):  53.2 Dose of Lexiscan: n/a mg  Dose of Atropine (mg): n/a Dose of Dobutamine: n/a mcg/kg/min (at max HR)  Stress Test Technologist: Perrin Maltese, EMT-P  Nuclear Technologist:  Charlton Amor, CNMT     Rest Procedure:  Myocardial perfusion imaging was performed at rest 45 minutes following the intravenous administration of Technetium  41m Tetrofosmin. Rest ECG: NSR-LBBB  Stress Procedure:  The patient received IV adenosine at 140 mcg/kg/min for 4 minutes.  There were no significant changes, flushed, and occ pvcs with infusion.  Technetium 75m Tetrofosmin was injected at the 2 minute mark and quantitative spect images were obtained after a 45 minute delay. Stress ECG: No significant change from baseline ECG  QPS Raw Data Images:  LV is dilated Stress Images:  Decreased uptake in the inferior and infero-apical regions Rest Images:  Decreased uptake in the inferior and infero-apical regions Subtraction (SDS):  Dense infarct in the inferior wall and infero-apex. Trivial peri-infarct ischemia. Transient Ischemic Dilatation (Normal <1.22):  0.98 Lung/Heart Ratio (Normal <0.45):  0.36  Quantitative Gated Spect Images QGS EDV:  200 ml QGS ESV:  152 ml QGS cine images:  LV is markedly dilated with severe global hypokinesis. QGS EF: 24%  Impression Exercise Capacity:  Adenosine study with no exercise. BP Response:  n/a Clinical Symptoms:  n/a ECG Impression:  No significant ST segment change with adenosine. Comparison with Prior Nuclear Study: No significant change from previous study  Overall Impression:  Abnormal stress nuclear study. Evidence of previous inferior infarct with trivial peri-infarct ischemia. LV markedly dilated with severe global hypokinesis out of proportion to perfusion defects.      Daniel Bensimhon

## 2011-06-06 ENCOUNTER — Other Ambulatory Visit: Payer: Self-pay | Admitting: Cardiology

## 2011-06-06 NOTE — Telephone Encounter (Signed)
Refill on zolpidem

## 2011-06-11 ENCOUNTER — Telehealth: Payer: Self-pay | Admitting: Cardiology

## 2011-06-11 DIAGNOSIS — G4700 Insomnia, unspecified: Secondary | ICD-10-CM

## 2011-06-11 NOTE — Telephone Encounter (Signed)
Pt wants to know stress test results please call

## 2011-06-11 NOTE — Telephone Encounter (Signed)
Please report.  The stress test once again shows poor left ventricular function from his dilated cardiomyopathy.  In 2010 his ejection fraction was 20% and on this test is 24%.  There was no significant ischemia fortunately.  He is okay to proceed with a orthopedic surgery and we are sending the orthopedist a note to that effect.  We will ask them to be sure to avoid excessive perioperative intravenous fluids to avoid fluid overload.

## 2011-06-11 NOTE — Telephone Encounter (Signed)
Advised ok for surgery and faxed over ok Refilled meds per fax request.

## 2011-06-12 ENCOUNTER — Other Ambulatory Visit: Payer: Self-pay | Admitting: Cardiology

## 2011-06-12 DIAGNOSIS — I119 Hypertensive heart disease without heart failure: Secondary | ICD-10-CM

## 2011-06-12 MED ORDER — VALSARTAN-HYDROCHLOROTHIAZIDE 320-12.5 MG PO TABS: 1.0000 | ORAL_TABLET | Freq: Every day | ORAL | Status: DC

## 2011-06-12 NOTE — Telephone Encounter (Signed)
Refilled diovan 

## 2011-06-13 ENCOUNTER — Telehealth: Payer: Self-pay | Admitting: Cardiology

## 2011-06-13 DIAGNOSIS — I119 Hypertensive heart disease without heart failure: Secondary | ICD-10-CM

## 2011-06-13 MED ORDER — VALSARTAN-HYDROCHLOROTHIAZIDE 320-12.5 MG PO TABS: 1.0000 | ORAL_TABLET | Freq: Every day | ORAL | Status: DC

## 2011-06-13 NOTE — Telephone Encounter (Signed)
Pharmacy calling pt is in pharmacy waiting for divoan refill please call in ASAP.

## 2011-06-14 ENCOUNTER — Other Ambulatory Visit: Payer: Self-pay

## 2011-06-14 DIAGNOSIS — I119 Hypertensive heart disease without heart failure: Secondary | ICD-10-CM

## 2011-06-14 MED ORDER — VALSARTAN-HYDROCHLOROTHIAZIDE 320-12.5 MG PO TABS: 1.0000 | ORAL_TABLET | Freq: Every day | ORAL | Status: DC

## 2011-06-14 NOTE — Telephone Encounter (Signed)
Pt checked with the pharmacy this am, med not there, said talked to susie yesterday and said she would call it in right then, pt upset, wants it called in ASAP

## 2011-06-19 ENCOUNTER — Encounter: Payer: Self-pay | Admitting: Cardiology

## 2011-06-19 ENCOUNTER — Ambulatory Visit (INDEPENDENT_AMBULATORY_CARE_PROVIDER_SITE_OTHER): Payer: Medicare Other | Admitting: Cardiology

## 2011-06-19 ENCOUNTER — Other Ambulatory Visit (INDEPENDENT_AMBULATORY_CARE_PROVIDER_SITE_OTHER): Payer: Medicare Other | Admitting: *Deleted

## 2011-06-19 VITALS — BP 148/88 | HR 70 | Ht 72.0 in | Wt 210.0 lb

## 2011-06-19 DIAGNOSIS — I119 Hypertensive heart disease without heart failure: Secondary | ICD-10-CM

## 2011-06-19 DIAGNOSIS — E119 Type 2 diabetes mellitus without complications: Secondary | ICD-10-CM

## 2011-06-19 DIAGNOSIS — I259 Chronic ischemic heart disease, unspecified: Secondary | ICD-10-CM

## 2011-06-19 DIAGNOSIS — M1990 Unspecified osteoarthritis, unspecified site: Secondary | ICD-10-CM

## 2011-06-19 DIAGNOSIS — E7800 Pure hypercholesterolemia, unspecified: Secondary | ICD-10-CM

## 2011-06-19 LAB — BASIC METABOLIC PANEL
CO2: 25 mEq/L (ref 19–32)
Glucose, Bld: 104 mg/dL — ABNORMAL HIGH (ref 70–99)
Potassium: 4.6 mEq/L (ref 3.5–5.1)
Sodium: 140 mEq/L (ref 135–145)

## 2011-06-19 LAB — HEPATIC FUNCTION PANEL
AST: 25 U/L (ref 0–37)
Albumin: 3.9 g/dL (ref 3.5–5.2)
Alkaline Phosphatase: 49 U/L (ref 39–117)
Total Protein: 6.7 g/dL (ref 6.0–8.3)

## 2011-06-19 LAB — LIPID PANEL
Cholesterol: 101 mg/dL (ref 0–200)
HDL: 34.6 mg/dL — ABNORMAL LOW (ref >39.00)

## 2011-06-19 NOTE — Patient Instructions (Signed)
Your physician recommends that you continue on your current medications as directed. Please refer to the Current Medication list given to you today. Your physician recommends that you schedule a follow-up appointment in: 3 months with fasting labs (lp/bmet/hfp)

## 2011-06-19 NOTE — Assessment & Plan Note (Signed)
Patient has not been experiencing any recent exertional symptoms.  He is scheduled for right total knee replacement on December 27 by Dr. Tonita Cong.  We have already sent clearance for the surgery

## 2011-06-19 NOTE — Assessment & Plan Note (Signed)
Patient is on metformin for his diabetes.  He is not having any hypoglycemic episode

## 2011-06-19 NOTE — Progress Notes (Signed)
Shane [name] Date of Birth:  [birth year] [facility] [id number]   [place], [place]  [id number] [phone]           Fax   [phone]  History of Present Illness: This pleasant 77 year old gentleman is seen for a scheduled followup office visit.  He has a history of ischemic cardiomyopathy.  Since last visit she's been feeling well.  Has not been expressing any chest pain or shortness of breath.  He denies any dizziness or syncope.  Is not aware of any palpitations.  He had a nuclear stress test 06/05/11 which did not show any ischemia and showed that his left ventricular ejection fraction increased from 20% to 24%  Current Outpatient Prescriptions  Medication Sig Dispense Refill  . amLODipine (NORVASC) 10 MG tablet Take 1 tablet (10 mg total) by mouth daily.  90 tablet  2  . aspirin 81 MG tablet Take 81 mg by mouth daily.        Marland Kitchen atorvastatin (LIPITOR) 10 MG tablet Take 10 mg by mouth daily.        Marland Kitchen b complex vitamins tablet Take 1 tablet by mouth daily.        . budesonide (PULMICORT) 180 MCG/ACT inhaler Inhale 1 puff into the lungs 2 (two) times daily. As directed       . Cholecalciferol (VITAMIN D3) 1000 UNITS CAPS 1,000 mg.        . latanoprost (XALATAN) 0.005 % ophthalmic solution 1 drop at bedtime. As directed       . metFORMIN (GLUCOPHAGE) 500 MG tablet Take 500 mg by mouth daily.        . metoprolol (TOPROL-XL) 50 MG 24 hr tablet Take 25 mg by mouth daily.       . Multiple Vitamin (MULTIVITAMIN) tablet Take 1 tablet by mouth daily.        Marland Kitchen NIASPAN 1000 MG CR tablet TAKE 2 TABLETS AT BEDTIME  180 tablet  2  . spironolactone (ALDACTONE) 25 MG tablet TAKE ONE TABLET BY MOUTH EVERY DAY  30 tablet  11  . tadalafil (CIALIS) 20 MG tablet Take 20 mg by mouth daily as needed.        . valsartan-hydrochlorothiazide (DIOVAN HCT) 320-12.5 MG per tablet Take 1 tablet by mouth daily.  30 tablet  5  . zolpidem (AMBIEN) 5 MG tablet TAKE ONE TO TWO  TABLETS BY MOUTH AT BEDTIME AS NEEDED FOR SLEEP  60 tablet  0    Allergies  Allergen Reactions  . Azithromycin     Patient Active Problem List  Diagnoses  . Ischemic heart disease  . Dyslipidemia  . Hyperkalemia  . Hypercholesterolemia  . Diabetes mellitus  . Benign hypertensive heart disease without heart failure  . Allergic rhinitis  . Osteoarthritis  . Cellulitis of left elbow and left elbow septic bursitis  . CAD s/p coronary arthery bypass graft  . BPH (benign prostatic hyperplasia)  . Kidney disease, chronic, stage III (GFR 30-59 ml/min)  . H/O Spinal stenosis    History  Smoking status  . Former Smoker  Smokeless tobacco  . Never Used    History  Alcohol Use No    Family History  Problem Relation Age of Onset  . Heart attack Father   . Heart disease Father     Review of Systems: Constitutional: no fever chills diaphoresis or fatigue or change in weight.  Head and neck: no hearing loss, no epistaxis, no  photophobia or visual disturbance. Respiratory: No cough, shortness of breath or wheezing. Cardiovascular: No chest pain peripheral edema, palpitations. Gastrointestinal: No abdominal distention, no abdominal pain, no change in bowel habits hematochezia or melena. Genitourinary: No dysuria, no frequency, no urgency, no nocturia. Musculoskeletal:No arthralgias, no back pain, no gait disturbance or myalgias. Neurological: No dizziness, no headaches, no numbness, no seizures, no syncope, no weakness, no tremors. Hematologic: No lymphadenopathy, no easy bruising. Psychiatric: No confusion, no hallucinations, no sleep disturbance.    Physical Exam: Filed Vitals:   06/19/11 0934  BP: 148/88  Pulse: 70   the general appearance reveals a well-developed well-nourished elderly gentleman in no distress.Pupils equal and reactive.   Extraocular Movements are full.  There is no scleral icterus.  The mouth and pharynx are normal.  The neck is supple.  The carotids  reveal no bruits.  The jugular venous pressure is normal.  The thyroid is not enlarged.  There is no lymphadenopathy.  The chest is clear to percussion and auscultation. There are no rales or rhonchi. Expansion of the chest is symmetrical.  Heart reveals no gallop murmur click or rubThe abdomen is soft and nontender. Bowel sounds are normal. The liver and spleen are not enlarged. There Are no abdominal masses. There are no bruits.  Extremities reveal no phlebitis.  He wears a brace on his left lower leg and foot because of foot drop.   Assessment / Plan:  The patient is stable from a cardiac standpoint for knee surgery on 08/01/11 recheck in 3 months for followup office visit EKG and lab work.

## 2011-06-19 NOTE — Assessment & Plan Note (Signed)
His lipids are well controlled on his current low dose therapy with Lipitor.  He is not having any myalgias from Lipitor

## 2011-06-20 ENCOUNTER — Telehealth: Payer: Self-pay | Admitting: *Deleted

## 2011-06-20 NOTE — Telephone Encounter (Signed)
Message copied by Earvin Hansen on Thu Jun 20, 2011  1:34 PM ------      Message from: Darlin Coco      Created: Wed Jun 19, 2011  5:30 PM       Please report.  The lipids are good.  Cholesterol is 101.  Blood sugar is improved at 104.  The kidneys are slightly dry air and he needs to drink plenty of water.

## 2011-06-20 NOTE — Telephone Encounter (Signed)
Spoke with wife, patient needs to increase his water.  Also mailed copy and if any questions to call

## 2011-06-21 ENCOUNTER — Other Ambulatory Visit: Payer: Self-pay | Admitting: Otolaryngology

## 2011-06-24 ENCOUNTER — Other Ambulatory Visit: Payer: Self-pay | Admitting: Otolaryngology

## 2011-06-25 ENCOUNTER — Other Ambulatory Visit (HOSPITAL_COMMUNITY): Payer: Medicare Other

## 2011-07-03 MED ORDER — ZOLPIDEM TARTRATE 5 MG PO TABS: ORAL_TABLET | ORAL | Status: DC

## 2011-07-22 ENCOUNTER — Encounter (HOSPITAL_COMMUNITY): Payer: Self-pay | Admitting: Pharmacy Technician

## 2011-07-23 ENCOUNTER — Encounter (HOSPITAL_COMMUNITY)
Admission: RE | Admit: 2011-07-23 | Discharge: 2011-07-23 | Disposition: A | Discharge status: Home / Self Care | Payer: Medicare Other | Source: Ambulatory Visit | Attending: Specialist | Admitting: Specialist

## 2011-07-23 ENCOUNTER — Encounter (HOSPITAL_COMMUNITY): Payer: Self-pay

## 2011-07-23 HISTORY — DX: Reserved for inherently not codable concepts without codable children: IMO0001

## 2011-07-23 HISTORY — DX: Unspecified osteoarthritis, unspecified site: M19.90

## 2011-07-23 HISTORY — DX: Encounter for other specified aftercare: Z51.89

## 2011-07-23 HISTORY — DX: Acute myocardial infarction, unspecified: I21.9

## 2011-07-23 HISTORY — DX: Pneumonia, unspecified organism: J18.9

## 2011-07-23 LAB — PROTIME-INR
INR: 1.1 (ref 0.00–1.49)
Prothrombin Time: 14.4 seconds (ref 11.6–15.2)

## 2011-07-23 LAB — COMPREHENSIVE METABOLIC PANEL
CO2: 25 mEq/L (ref 19–32)
Calcium: 10 mg/dL (ref 8.4–10.5)
Creatinine, Ser: 1.8 mg/dL — ABNORMAL HIGH (ref 0.50–1.35)
GFR calc Af Amer: 41 mL/min — ABNORMAL LOW (ref >90)
GFR calc non Af Amer: 35 mL/min — ABNORMAL LOW (ref >90)
Glucose, Bld: 110 mg/dL — ABNORMAL HIGH (ref 70–99)
Total Protein: 7.1 g/dL (ref 6.0–8.3)

## 2011-07-23 LAB — CBC
Hemoglobin: 13.7 g/dL (ref 13.0–17.0)
MCH: 28.2 pg (ref 26.0–34.0)
Platelets: 144 10*3/uL — ABNORMAL LOW (ref 150–400)
RBC: 4.85 MIL/uL (ref 4.22–5.81)
WBC: 5.6 10*3/uL (ref 4.0–10.5)

## 2011-07-23 LAB — URINALYSIS, ROUTINE W REFLEX MICROSCOPIC
Leukocytes, UA: NEGATIVE
Nitrite: NEGATIVE
Specific Gravity, Urine: 1.025 (ref 1.005–1.030)
Urobilinogen, UA: 0.2 mg/dL (ref 0.0–1.0)
pH: 5.5 (ref 5.0–8.0)

## 2011-07-23 LAB — URINE MICROSCOPIC-ADD ON

## 2011-07-23 LAB — DIFFERENTIAL
Eosinophils Absolute: 0.3 10*3/uL (ref 0.0–0.7)
Lymphocytes Relative: 27 % (ref 12–46)
Lymphs Abs: 1.5 10*3/uL (ref 0.7–4.0)
Monocytes Relative: 15 % — ABNORMAL HIGH (ref 3–12)
Neutro Abs: 3 10*3/uL (ref 1.7–7.7)
Neutrophils Relative %: 53 % (ref 43–77)

## 2011-07-23 LAB — SURGICAL PCR SCREEN: Staphylococcus aureus: POSITIVE — AB

## 2011-07-23 NOTE — Pre-Procedure Instructions (Signed)
05/29/11 EKG in [facility]  08/20/10 CXR in [place] with cardiology 06/19/11 in [facility] with Dr Mare Ferrari  ECHO 06/19/09 in [facility]  Stress Test 06/16/09 in [place]

## 2011-07-23 NOTE — H&P (Signed)
Chief Complaint:  right knee pain.  Subjective: Patient is admitted for right total knee arthroplasty.  Patient is a 77 y.o. male who has been seen by Dr. Tonita Cong for ongoing hip pain.  They have been followed and found to have continued progressive pain.  They have been treated conservatively in the past including medications.  Despite conservative measures, they continue to have pain.  X-rays show that the patient has tri compartmental OA.  It is felt that they would benefit from undergoing surgical intervention.  Risks and benefits have been discussed with the patient and they elect to proceed with surgery.  The patient has no contraindications to the upcoming procedure such as ongoing infection or progressive neurological disease.  Allergies: Allergies  Allergen Reactions  . Azithromycin Rash     Medications: Lipitor Niaspan Zolpidem Diovan/HCT Norvasc Metformin Pulmicort Respules Toprol XL Xalatan Spironolactone Aspirin Tylenol Benedryl MVI B-complex Vitamin D-3 Cialis   Past Medical History: Past Medical History  Diagnosis Date  . Coronary artery disease     remote CABG in 1985, cath in 2003 by Dr. Lia Foyer with no PCI, last nuclear in 2010 showing scar and EF of 28%.   . Dyslipidemia   . Hyperkalemia   . Hypercholesterolemia   . Left ventricular dysfunction     28% per nuclear in 2010 and 35 to 40% per echo in 2010  . HTN (hypertension)   . Diabetes mellitus   . Cellulitis of arm, left     MSSA  . CKD (chronic kidney disease), stage III   . Spinal stenosis   . BPH (benign prostatic hyperplasia)   . Allergic rhinitis   . Abnormal nuclear cardiac imaging test Nov 2010    moderate area of infarct in the inferior wall with only minimal reversibility and EF of 28%  . ED (erectile dysfunction)      Past Surgical History: Past Surgical History  Procedure Date  . Cardiac catheterization 2003  . Coronary artery bypass graft 1985  . Lumbar disc surgery       Family History: Family History  Problem Relation Age of Onset  . Heart attack Father   . Heart disease Father     Social History: History  Substance Use Topics  . Smoking status: Former Research scientist (life sciences)  . Smokeless tobacco: Never Used  . Alcohol Use: No     Review of Systems Constitutional: negative for fatigue, fevers and sweats Ears, nose, mouth, throat, and face: negative for earaches, epistaxis and tinnitus Respiratory: negative for cough, dyspnea on exertion and pleurisy/chest pain Cardiovascular: negative for chest pressure/discomfort, dyspnea, irregular heart beat and orthopnea Gastrointestinal: negative for abdominal pain, constipation and diarrhea Genitourinary:negative except for urinary frequency  Physical Exam:  Vitals: Pulse:76 Respirations:10 Blood Pressure:140/96  General Appearance:    Alert, cooperative, no distress, appears stated age  Head:    Normocephalic, without obvious abnormality, atraumatic  Eyes:    PERRL, conjunctiva/corneas clear, EOM's intact, fundi    benign, both eyes       Ears:    Normal TM's and external ear canals, both ears  Nose:   Nares normal, septum midline, mucosa normal, no drainage    or sinus tenderness  Throat:   Lips, mucosa, and tongue normal; teeth and gums normal  Neck:   Supple, symmetrical, trachea midline, no adenopathy;       thyroid:  No enlargement/tenderness/nodules; no carotid   bruit or JVD  Back:     Symmetric, no curvature, ROM normal, no CVA  tenderness  Lungs:     Clear to auscultation bilaterally, respirations unlabored  Chest wall:    No tenderness or deformity  Heart:    Regular rate and rhythm, S1 and S2 normal, no murmur, rub   or gallop  Abdomen:     Soft, non-tender, bowel sounds active all four quadrants,    no masses, no organomegaly        Extremities:   Mild effusion, TTP medial joint line, -2 to 110 right  Pulses:   2+ and symmetric all extremities  Skin:   Skin color, texture, turgor normal, no  rashes or lesions          Assessment/Plan: End stage arthritis, right knee  The patient is being admitted to [facility] to undergo a right total knee arthroplasty.  Surgery will be performed by Dr. Susa Day.  Risks and benefits have been discussed with the patient and they elect to proceed wth the procedure.  Patient was given prescriptions for pain meds and Xarelto at [facility] visit.  He was cleared by Dr Mare Ferrari for surgery. Patient has decreased LV function so monitor peri operative fluids closely.

## 2011-07-23 NOTE — Patient Instructions (Signed)
[place]  07/23/2011   Your procedure is scheduled on:  08/01/11 0740am-1010am  Report to [place] at [place] AM.  Call this number if you have problems the morning of surgery: [phone]   Remember:   Do not eat food:After Midnight.  May have clear liquids:until Midnight .  Clear liquids include soda, tea, black coffee, apple or grape juice, broth.  Take these medicines the morning of surgery with A SIP OF WATER:    Do not wear jewelry,   Do not wear lotions, powders, or perfumes.   .  Do not bring valuables to the hospital.  Contacts, dentures or bridgework may not be worn into surgery.  Leave suitcase in the car. After surgery it may be brought to your room.  For patients admitted to the hospital, checkout time is 11:00 AM the day of discharge.  Marland Kitchen   Special Instructions: CHG Shower Use Special Wash: 1/2 bottle night before surgery and 1/2 bottle morning of surgery.   Please read over the following fact sheets that you were given: MRSA Information, Blood Transfusion Fact Sheet, incentive Fact sheet, coughing and deep breathing exercises, leg exercises

## 2011-07-26 ENCOUNTER — Telehealth: Payer: Self-pay | Admitting: Cardiology

## 2011-07-26 DIAGNOSIS — J329 Chronic sinusitis, unspecified: Secondary | ICD-10-CM

## 2011-07-26 MED ORDER — AMOXICILLIN 500 MG PO CAPS: 500.0000 mg | ORAL_CAPSULE | Freq: Four times a day (QID) | ORAL | Status: AC

## 2011-07-26 NOTE — Telephone Encounter (Signed)
Advised patient and sent to pharmacy  

## 2011-07-26 NOTE — Telephone Encounter (Signed)
Do you want to Rx something?

## 2011-07-26 NOTE — Telephone Encounter (Signed)
Amoxicillin 500 mg 4 times a day #30

## 2011-07-26 NOTE — Telephone Encounter (Signed)
Okay to call in a Z-Pak

## 2011-07-26 NOTE — Telephone Encounter (Signed)
New Problem:   Patient is going to have knee replacement surgery 08/01/11 and he has come down with a sinus infection and was wondering if Dr. Mare Ferrari would either see him today or write a prescription for some medicine for him. Please call back.

## 2011-07-26 NOTE — Telephone Encounter (Signed)
Patient allergic to Zpak, Rx something else?  States he is not allergic to anything else he knows of

## 2011-08-01 ENCOUNTER — Encounter (HOSPITAL_COMMUNITY): Payer: Self-pay | Admitting: Anesthesiology

## 2011-08-01 ENCOUNTER — Inpatient Hospital Stay (HOSPITAL_COMMUNITY): Payer: Medicare Other

## 2011-08-01 ENCOUNTER — Inpatient Hospital Stay (HOSPITAL_COMMUNITY)
Admission: RE | Admit: 2011-08-01 | Discharge: 2011-08-04 | DRG: 470 | Disposition: A | Discharge status: Home Health | Payer: Medicare Other | Source: Ambulatory Visit | Attending: Specialist | Admitting: Specialist

## 2011-08-01 ENCOUNTER — Inpatient Hospital Stay (HOSPITAL_COMMUNITY): Payer: Medicare Other | Admitting: Anesthesiology

## 2011-08-01 ENCOUNTER — Encounter (HOSPITAL_COMMUNITY): Payer: Self-pay | Admitting: *Deleted

## 2011-08-01 ENCOUNTER — Encounter (HOSPITAL_COMMUNITY)
Admission: RE | Disposition: A | Discharge status: Home Health | Payer: Self-pay | Source: Ambulatory Visit | Attending: Specialist

## 2011-08-01 DIAGNOSIS — E7800 Pure hypercholesterolemia, unspecified: Secondary | ICD-10-CM | POA: Diagnosis present

## 2011-08-01 DIAGNOSIS — M1710 Unilateral primary osteoarthritis, unspecified knee: Principal | ICD-10-CM | POA: Diagnosis present

## 2011-08-01 DIAGNOSIS — E785 Hyperlipidemia, unspecified: Secondary | ICD-10-CM | POA: Diagnosis present

## 2011-08-01 DIAGNOSIS — I129 Hypertensive chronic kidney disease with stage 1 through stage 4 chronic kidney disease, or unspecified chronic kidney disease: Secondary | ICD-10-CM | POA: Diagnosis present

## 2011-08-01 DIAGNOSIS — N183 Chronic kidney disease, stage 3 unspecified: Secondary | ICD-10-CM | POA: Diagnosis present

## 2011-08-01 DIAGNOSIS — J329 Chronic sinusitis, unspecified: Secondary | ICD-10-CM

## 2011-08-01 DIAGNOSIS — E119 Type 2 diabetes mellitus without complications: Secondary | ICD-10-CM | POA: Diagnosis present

## 2011-08-01 DIAGNOSIS — I2510 Atherosclerotic heart disease of native coronary artery without angina pectoris: Secondary | ICD-10-CM | POA: Diagnosis present

## 2011-08-01 DIAGNOSIS — I252 Old myocardial infarction: Secondary | ICD-10-CM

## 2011-08-01 DIAGNOSIS — Z01812 Encounter for preprocedural laboratory examination: Secondary | ICD-10-CM

## 2011-08-01 DIAGNOSIS — Z951 Presence of aortocoronary bypass graft: Secondary | ICD-10-CM

## 2011-08-01 HISTORY — DX: Myoneural disorder, unspecified: G70.9

## 2011-08-01 HISTORY — PX: TOTAL KNEE ARTHROPLASTY: SHX125

## 2011-08-01 LAB — GLUCOSE, CAPILLARY: Glucose-Capillary: 143 mg/dL — ABNORMAL HIGH (ref 70–99)

## 2011-08-01 LAB — ABO/RH: ABO/RH(D): O POS

## 2011-08-01 SURGERY — ARTHROPLASTY, KNEE, TOTAL
Anesthesia: General | Site: Knee | Laterality: Right | Wound class: Clean

## 2011-08-01 MED ORDER — ACETAMINOPHEN 650 MG RE SUPP: 650.0000 mg | Freq: Four times a day (QID) | RECTAL | Status: DC | PRN

## 2011-08-01 MED ORDER — HYDROCHLOROTHIAZIDE 12.5 MG PO CAPS
12.5000 mg | ORAL_CAPSULE | Freq: Every day | ORAL | Status: AC
  Administered 2011-08-02 – 2011-08-03 (×3): 12.5 mg via ORAL
  Filled 2011-08-01 (×2): qty 1

## 2011-08-01 MED ORDER — CEFAZOLIN SODIUM 1-5 GM-% IV SOLN
INTRAVENOUS | Status: DC | PRN
  Administered 2011-08-01: 2 g via INTRAVENOUS

## 2011-08-01 MED ORDER — ONDANSETRON HCL 4 MG PO TABS: 4.0000 mg | ORAL_TABLET | Freq: Four times a day (QID) | ORAL | Status: DC | PRN

## 2011-08-01 MED ORDER — ASPIRIN 81 MG PO TABS
81.0000 mg | ORAL_TABLET | Freq: Every day | ORAL | Status: DC
  Filled 2011-08-01: qty 1

## 2011-08-01 MED ORDER — BUPIVACAINE-EPINEPHRINE 0.5% -1:200000 IJ SOLN
INTRAMUSCULAR | Status: DC | PRN
  Administered 2011-08-01: 17 mL

## 2011-08-01 MED ORDER — LACTATED RINGERS IV SOLN
INTRAVENOUS | Status: DC
  Administered 2011-08-02 – 2011-08-03 (×3): via INTRAVENOUS
  Administered 2011-08-04: 1000 mL via INTRAVENOUS

## 2011-08-01 MED ORDER — METHOCARBAMOL 500 MG PO TABS
500.0000 mg | ORAL_TABLET | Freq: Four times a day (QID) | ORAL | Status: DC | PRN
  Administered 2011-08-04 (×2): 500 mg via ORAL
  Filled 2011-08-01 (×2): qty 1

## 2011-08-01 MED ORDER — ACETAMINOPHEN 325 MG PO TABS
650.0000 mg | ORAL_TABLET | Freq: Four times a day (QID) | ORAL | Status: DC | PRN
  Administered 2011-08-02 (×2): 650 mg via ORAL
  Filled 2011-08-01 (×2): qty 2

## 2011-08-01 MED ORDER — SODIUM CHLORIDE 0.9 % IJ SOLN: 9.0000 mL | INTRAMUSCULAR | Status: DC | PRN

## 2011-08-01 MED ORDER — OXYCODONE-ACETAMINOPHEN 5-325 MG PO TABS
1.0000 | ORAL_TABLET | ORAL | Status: DC | PRN
  Administered 2011-08-04: 2 via ORAL
  Filled 2011-08-01: qty 2

## 2011-08-01 MED ORDER — LATANOPROST 0.005 % OP SOLN
1.0000 [drp] | Freq: Every day | OPHTHALMIC | Status: DC
  Administered 2011-08-01 – 2011-08-03 (×3): 1 [drp] via OPHTHALMIC
  Filled 2011-08-01: qty 2.5

## 2011-08-01 MED ORDER — CEFAZOLIN SODIUM-DEXTROSE 2-3 GM-% IV SOLR: 2.0000 g | INTRAVENOUS | Status: AC

## 2011-08-01 MED ORDER — PROMETHAZINE HCL 25 MG/ML IJ SOLN: 6.2500 mg | INTRAMUSCULAR | Status: DC | PRN

## 2011-08-01 MED ORDER — PROPOFOL 10 MG/ML IV EMUL
INTRAVENOUS | Status: DC | PRN
  Administered 2011-08-01: 50 mg via INTRAVENOUS
  Administered 2011-08-01: 150 mg via INTRAVENOUS
  Administered 2011-08-01: 50 mg via INTRAVENOUS

## 2011-08-01 MED ORDER — SUCCINYLCHOLINE CHLORIDE 20 MG/ML IJ SOLN
INTRAMUSCULAR | Status: DC | PRN
  Administered 2011-08-01: 100 mg via INTRAVENOUS

## 2011-08-01 MED ORDER — CEFAZOLIN SODIUM-DEXTROSE 2-3 GM-% IV SOLR
2.0000 g | Freq: Four times a day (QID) | INTRAVENOUS | Status: AC
  Administered 2011-08-01 – 2011-08-02 (×3): 2 g via INTRAVENOUS
  Filled 2011-08-01 (×3): qty 50

## 2011-08-01 MED ORDER — SODIUM CHLORIDE 0.9 % IR SOLN
Status: DC | PRN
  Administered 2011-08-01: 3000 mL

## 2011-08-01 MED ORDER — DIPHENHYDRAMINE HCL 12.5 MG/5ML PO ELIX: 12.5000 mg | ORAL_SOLUTION | Freq: Four times a day (QID) | ORAL | Status: DC | PRN

## 2011-08-01 MED ORDER — CHLORHEXIDINE GLUCONATE 4 % EX LIQD: 60.0000 mL | Freq: Once | CUTANEOUS | Status: DC

## 2011-08-01 MED ORDER — METOCLOPRAMIDE HCL 5 MG/ML IJ SOLN: 5.0000 mg | Freq: Three times a day (TID) | INTRAMUSCULAR | Status: DC | PRN

## 2011-08-01 MED ORDER — HYDROMORPHONE 0.3 MG/ML IV SOLN
INTRAVENOUS | Status: DC
  Administered 2011-08-01: 11:00:00 via INTRAVENOUS
  Administered 2011-08-01: 1.2 mg via INTRAVENOUS
  Administered 2011-08-01: 2.1 mg via INTRAVENOUS
  Administered 2011-08-01: 22:00:00 via INTRAVENOUS
  Administered 2011-08-02: 3.3 mg via INTRAVENOUS
  Administered 2011-08-02: 2.4 mg via INTRAVENOUS
  Administered 2011-08-02: 3.3 mg via INTRAVENOUS
  Administered 2011-08-02: 3.94 mg via INTRAVENOUS
  Administered 2011-08-02: 20:00:00 via INTRAVENOUS
  Administered 2011-08-02: 5 mg via INTRAVENOUS
  Administered 2011-08-03: 0.9 mg via INTRAVENOUS
  Administered 2011-08-03: 22:00:00 via INTRAVENOUS
  Filled 2011-08-01 (×6): qty 25

## 2011-08-01 MED ORDER — MENTHOL 3 MG MT LOZG: 1.0000 | LOZENGE | OROMUCOSAL | Status: DC | PRN

## 2011-08-01 MED ORDER — FENTANYL CITRATE 0.05 MG/ML IJ SOLN
INTRAMUSCULAR | Status: DC | PRN
  Administered 2011-08-01 (×3): 50 ug via INTRAVENOUS
  Administered 2011-08-01: 100 ug via INTRAVENOUS

## 2011-08-01 MED ORDER — DIPHENHYDRAMINE HCL 25 MG PO CAPS: 25.0000 mg | ORAL_CAPSULE | Freq: Four times a day (QID) | ORAL | Status: DC | PRN

## 2011-08-01 MED ORDER — ASPIRIN EC 81 MG PO TBEC
81.0000 mg | DELAYED_RELEASE_TABLET | Freq: Every day | ORAL | Status: DC
  Administered 2011-08-01 – 2011-08-04 (×4): 81 mg via ORAL
  Filled 2011-08-01 (×4): qty 1

## 2011-08-01 MED ORDER — METHOCARBAMOL 100 MG/ML IJ SOLN
500.0000 mg | Freq: Four times a day (QID) | INTRAVENOUS | Status: DC | PRN
  Administered 2011-08-01 – 2011-08-02 (×2): 500 mg via INTRAVENOUS
  Filled 2011-08-01 (×2): qty 5

## 2011-08-01 MED ORDER — LIDOCAINE HCL (CARDIAC) 20 MG/ML IV SOLN
INTRAVENOUS | Status: DC | PRN
  Administered 2011-08-01: 100 mg via INTRAVENOUS

## 2011-08-01 MED ORDER — ACETAMINOPHEN 10 MG/ML IV SOLN
INTRAVENOUS | Status: DC | PRN
  Administered 2011-08-01: 1000 mg via INTRAVENOUS

## 2011-08-01 MED ORDER — INSULIN ASPART 100 UNIT/ML ~~LOC~~ SOLN
0.0000 [IU] | Freq: Three times a day (TID) | SUBCUTANEOUS | Status: DC
  Administered 2011-08-01 – 2011-08-02 (×3): 2 [IU] via SUBCUTANEOUS
  Administered 2011-08-02: 19:00:00 via SUBCUTANEOUS
  Administered 2011-08-03: 2 [IU] via SUBCUTANEOUS
  Administered 2011-08-03: 3 [IU] via SUBCUTANEOUS
  Administered 2011-08-04: 2 [IU] via SUBCUTANEOUS
  Filled 2011-08-01: qty 3

## 2011-08-01 MED ORDER — OLMESARTAN MEDOXOMIL 40 MG PO TABS
40.0000 mg | ORAL_TABLET | Freq: Every day | ORAL | Status: AC
  Administered 2011-08-02 – 2011-08-04 (×3): 40 mg via ORAL
  Filled 2011-08-01 (×3): qty 1

## 2011-08-01 MED ORDER — METFORMIN HCL 500 MG PO TABS: 500.0000 mg | ORAL_TABLET | Freq: Every day | ORAL | Status: DC

## 2011-08-01 MED ORDER — SPIRONOLACTONE 25 MG PO TABS
25.0000 mg | ORAL_TABLET | Freq: Every day | ORAL | Status: DC
  Administered 2011-08-01 – 2011-08-04 (×4): 25 mg via ORAL
  Filled 2011-08-01 (×4): qty 1

## 2011-08-01 MED ORDER — HYDROMORPHONE HCL PF 1 MG/ML IJ SOLN
INTRAMUSCULAR | Status: DC | PRN
  Administered 2011-08-01: 0.5 mg via INTRAVENOUS
  Administered 2011-08-01: 1 mg via INTRAVENOUS
  Administered 2011-08-01: 0.5 mg via INTRAVENOUS

## 2011-08-01 MED ORDER — BUDESONIDE 1 MG/2ML IN SUSP
1.0000 mg | Freq: Every day | RESPIRATORY_TRACT | Status: DC
  Administered 2011-08-03: 1 mg via RESPIRATORY_TRACT
  Filled 2011-08-01: qty 2

## 2011-08-01 MED ORDER — RIVAROXABAN 10 MG PO TABS
10.0000 mg | ORAL_TABLET | Freq: Every day | ORAL | Status: DC
  Administered 2011-08-02 – 2011-08-04 (×3): 10 mg via ORAL
  Filled 2011-08-01 (×3): qty 1

## 2011-08-01 MED ORDER — ROPIVACAINE HCL 5 MG/ML IJ SOLN
INTRAMUSCULAR | Status: DC | PRN
  Administered 2011-08-01: 25 mL via EPIDURAL

## 2011-08-01 MED ORDER — PHENOL 1.4 % MT LIQD: 1.0000 | OROMUCOSAL | Status: DC | PRN

## 2011-08-01 MED ORDER — HYDROMORPHONE HCL PF 1 MG/ML IJ SOLN
0.2500 mg | INTRAMUSCULAR | Status: DC | PRN
  Administered 2011-08-01: 0.25 mg via INTRAVENOUS
  Administered 2011-08-01: 0.5 mg via INTRAVENOUS
  Administered 2011-08-01: 0.25 mg via INTRAVENOUS

## 2011-08-01 MED ORDER — METOCLOPRAMIDE HCL 10 MG PO TABS: 5.0000 mg | ORAL_TABLET | Freq: Three times a day (TID) | ORAL | Status: DC | PRN

## 2011-08-01 MED ORDER — DIPHENHYDRAMINE HCL 50 MG/ML IJ SOLN: 12.5000 mg | Freq: Four times a day (QID) | INTRAMUSCULAR | Status: DC | PRN

## 2011-08-01 MED ORDER — AMLODIPINE BESYLATE 10 MG PO TABS
10.0000 mg | ORAL_TABLET | Freq: Every day | ORAL | Status: DC
  Administered 2011-08-02 – 2011-08-04 (×3): 10 mg via ORAL
  Filled 2011-08-01 (×3): qty 1

## 2011-08-01 MED ORDER — ZOLPIDEM TARTRATE 5 MG PO TABS
5.0000 mg | ORAL_TABLET | Freq: Every evening | ORAL | Status: DC | PRN
  Administered 2011-08-03 (×2): 5 mg via ORAL
  Filled 2011-08-01 (×2): qty 1

## 2011-08-01 MED ORDER — ONDANSETRON HCL 4 MG/2ML IJ SOLN
4.0000 mg | Freq: Four times a day (QID) | INTRAMUSCULAR | Status: DC | PRN
  Filled 2011-08-01: qty 2

## 2011-08-01 MED ORDER — VALSARTAN-HYDROCHLOROTHIAZIDE 320-12.5 MG PO TABS: 1.0000 | ORAL_TABLET | ORAL | Status: DC

## 2011-08-01 MED ORDER — ONDANSETRON HCL 4 MG/2ML IJ SOLN
INTRAMUSCULAR | Status: DC | PRN
  Administered 2011-08-01: 4 mg via INTRAVENOUS

## 2011-08-01 MED ORDER — LACTATED RINGERS IV SOLN
INTRAVENOUS | Status: DC
  Administered 2011-08-01 (×2): via INTRAVENOUS

## 2011-08-01 MED ORDER — NALOXONE HCL 0.4 MG/ML IJ SOLN: 0.4000 mg | INTRAMUSCULAR | Status: DC | PRN

## 2011-08-01 MED ORDER — ONDANSETRON HCL 4 MG/2ML IJ SOLN
4.0000 mg | Freq: Four times a day (QID) | INTRAMUSCULAR | Status: DC | PRN
  Administered 2011-08-02: 4 mg via INTRAVENOUS

## 2011-08-01 MED ORDER — AMOXICILLIN 500 MG PO CAPS
500.0000 mg | ORAL_CAPSULE | Freq: Four times a day (QID) | ORAL | Status: DC
  Administered 2011-08-01 – 2011-08-04 (×11): 500 mg via ORAL
  Filled 2011-08-01 (×15): qty 1

## 2011-08-01 MED ORDER — METOPROLOL SUCCINATE ER 25 MG PO TB24
25.0000 mg | ORAL_TABLET | Freq: Every day | ORAL | Status: DC
  Administered 2011-08-02 – 2011-08-04 (×3): 25 mg via ORAL
  Filled 2011-08-01 (×3): qty 1

## 2011-08-01 MED ORDER — PHENYLEPHRINE HCL 10 MG/ML IJ SOLN
INTRAMUSCULAR | Status: DC | PRN
  Administered 2011-08-01 (×2): 40 ug via INTRAVENOUS
  Administered 2011-08-01: 80 ug via INTRAVENOUS
  Administered 2011-08-01: 40 ug via INTRAVENOUS
  Administered 2011-08-01 (×3): 80 ug via INTRAVENOUS
  Administered 2011-08-01 (×3): 40 ug via INTRAVENOUS

## 2011-08-01 MED ORDER — MIDAZOLAM HCL 5 MG/5ML IJ SOLN
INTRAMUSCULAR | Status: DC | PRN
  Administered 2011-08-01: 2 mg via INTRAVENOUS

## 2011-08-01 MED ORDER — SODIUM CHLORIDE 0.9 % IR SOLN
Status: DC | PRN
  Administered 2011-08-01: 08:00:00

## 2011-08-01 SURGICAL SUPPLY — 58 items
BAG SPEC THK2 15X12 ZIP CLS (MISCELLANEOUS) ×1
BAG ZIPLOCK 12X15 (MISCELLANEOUS) ×2 IMPLANT
BANDAGE ELASTIC 4 VELCRO ST LF (GAUZE/BANDAGES/DRESSINGS) ×2 IMPLANT
BANDAGE ELASTIC 6 VELCRO ST LF (GAUZE/BANDAGES/DRESSINGS) ×2 IMPLANT
BANDAGE ESMARK 6X9 LF (GAUZE/BANDAGES/DRESSINGS) ×1 IMPLANT
BLADE SAG 18X100X1.27 (BLADE) ×2 IMPLANT
BLADE SAW SGTL 13.0X1.19X90.0M (BLADE) ×2 IMPLANT
BNDG CMPR 9X6 STRL LF SNTH (GAUZE/BANDAGES/DRESSINGS) ×1
BNDG ESMARK 6X9 LF (GAUZE/BANDAGES/DRESSINGS) ×2
CEMENT HV SMART SET (Cement) ×4 IMPLANT
CHLORAPREP W/TINT 26ML (MISCELLANEOUS) IMPLANT
CLOSURE STERI STRIP 1/2 X4 (GAUZE/BANDAGES/DRESSINGS) ×2 IMPLANT
CLOTH BEACON ORANGE TIMEOUT ST (SAFETY) ×2 IMPLANT
CUFF TOURN SGL QUICK 34 (TOURNIQUET CUFF) ×2
CUFF TRNQT CYL 34X4X40X1 (TOURNIQUET CUFF) ×1 IMPLANT
DECANTER SPIKE VIAL GLASS SM (MISCELLANEOUS) ×2 IMPLANT
DRAPE LG THREE QUARTER DISP (DRAPES) ×4 IMPLANT
DRAPE ORTHO SPLIT 77X108 STRL (DRAPES) ×2
DRAPE POUCH INSTRU U-SHP 10X18 (DRAPES) ×2 IMPLANT
DRAPE SURG ORHT 6 SPLT 77X108 (DRAPES) ×2 IMPLANT
DRAPE U-SHAPE 47X51 STRL (DRAPES) ×2 IMPLANT
DRSG ADAPTIC 3X8 NADH LF (GAUZE/BANDAGES/DRESSINGS) ×2 IMPLANT
DRSG PAD ABDOMINAL 8X10 ST (GAUZE/BANDAGES/DRESSINGS) ×2 IMPLANT
DURAPREP 26ML APPLICATOR (WOUND CARE) ×2 IMPLANT
ELECT REM PT RETURN 9FT ADLT (ELECTROSURGICAL) ×2
ELECTRODE REM PT RTRN 9FT ADLT (ELECTROSURGICAL) ×1 IMPLANT
EVACUATOR 1/8 PVC DRAIN (DRAIN) ×2 IMPLANT
FACESHIELD LNG OPTICON STERILE (SAFETY) ×10 IMPLANT
GLOVE BIO SURGEON STRL SZ 6.5 (GLOVE) ×2 IMPLANT
GLOVE BIOGEL PI IND STRL 8 (GLOVE) ×1 IMPLANT
GLOVE BIOGEL PI INDICATOR 8 (GLOVE) ×1
GLOVE SURG SS PI 8.0 STRL IVOR (GLOVE) ×4 IMPLANT
GOWN PREVENTION PLUS XLARGE (GOWN DISPOSABLE) ×6 IMPLANT
HANDPIECE INTERPULSE COAX TIP (DISPOSABLE) ×2
IMMOBILIZER KNEE 20 (SOFTGOODS) ×2
IMMOBILIZER KNEE 20 THIGH 36 (SOFTGOODS) ×1 IMPLANT
KIT BASIN OR (CUSTOM PROCEDURE TRAY) ×2 IMPLANT
MANIFOLD NEPTUNE II (INSTRUMENTS) ×2 IMPLANT
NS IRRIG 1000ML POUR BTL (IV SOLUTION) IMPLANT
PACK TOTAL JOINT (CUSTOM PROCEDURE TRAY) ×2 IMPLANT
PADDING WEBRIL 6 STERILE (GAUZE/BANDAGES/DRESSINGS) ×2 IMPLANT
PENCIL BUTTON HOLSTER BLD 10FT (ELECTRODE) ×2 IMPLANT
POSITIONER SURGICAL ARM (MISCELLANEOUS) ×2 IMPLANT
SET HNDPC FAN SPRY TIP SCT (DISPOSABLE) ×1 IMPLANT
SPONGE GAUZE 4X4 12PLY (GAUZE/BANDAGES/DRESSINGS) ×2 IMPLANT
SPONGE SURGIFOAM ABS GEL 100 (HEMOSTASIS) IMPLANT
STAPLER VISISTAT (STAPLE) ×2 IMPLANT
SUCTION FRAZIER 12FR DISP (SUCTIONS) ×2 IMPLANT
SUT BONE WAX W31G (SUTURE) ×2 IMPLANT
SUT VIC AB 1 CT1 27 (SUTURE) ×8
SUT VIC AB 1 CT1 27XBRD ANTBC (SUTURE) ×4 IMPLANT
SUT VIC AB 2-0 CT1 27 (SUTURE) ×6
SUT VIC AB 2-0 CT1 TAPERPNT 27 (SUTURE) ×3 IMPLANT
SYR 30ML LL (SYRINGE) ×2 IMPLANT
TOWER CARTRIDGE SMART MIX (DISPOSABLE) ×2 IMPLANT
TRAY FOLEY CATH 14FRSI W/METER (CATHETERS) ×2 IMPLANT
WATER STERILE IRR 1500ML POUR (IV SOLUTION) ×2 IMPLANT
WRAP KNEE MAXI GEL POST OP (GAUZE/BANDAGES/DRESSINGS) ×2 IMPLANT

## 2011-08-01 NOTE — Preoperative (Signed)
Beta Blockers   Reason not to administer Beta Blockers:Toprol 50mg  taken this am by patient

## 2011-08-01 NOTE — Op Note (Signed)
NAME:  Shane [name], Shane [name] NO.:  [id number]  MEDICAL RECORD NO.:  [id number]  LOCATION:  [id number]                         FACILITY:  [facility]  PHYSICIAN:  Susa Day, M.D.    DATE OF BIRTH:  1934/04/16  DATE OF PROCEDURE:  08/01/2011 DATE OF DISCHARGE:                              OPERATIVE REPORT   PREOPERATIVE DIAGNOSES:  Degenerative joint disease of the right knee, end-stage varus deformity.  POSTOPERATIVE DIAGNOSES:  Degenerative joint disease of the right knee, end-stage varus deformity.  PROCEDURE PERFORMED:  Right total knee arthroplasty.  Components: DePuy rotating platform, 6 femur, 6 tibia, 10 mm insert, 41 patella.  HISTORY:  This gentleman has experienced end-stage osteoarthritis of the knee, bone on bone arthritis, varus deformity, flexion contracture, indicated for replacement of degenerated joint.  Risks and benefits were discussed including bleeding, infection, damage to vascular structures, no change in symptoms, worsening symptoms, need for repeat procedure, manipulation, DVT, PE, anesthetic complications etc.  TECHNIQUE:  With the patient in supine position, after induction of adequate general anesthesia, 2 g Kefzol, the right lower extremity was prepped, draped, and  exsanguinated in the usual sterile fashion.  Thigh tourniquet was inflated at 300 mmHg.  A midline incision was made, full thickness flaps developed.  He had a very shallow subcutaneous tissue and therefore we developed full-thickness flaps, did not undermine the skin.  Median parapatellar arthrotomy was performed.  The patella was everted.  The knee was flexed, severe tricompartmental osteoarthrosis was noted with eburnated bone.  Removed osteophytes with a rongeur. Arch condyles were noted.  Step drill was utilized to enter the femoral canal, and prior to this we released soft tissues medially and posteriorly with a curved Crego.  We removed remnants of the medial lateral  menisci and the ACL.  We cauterized the geniculate step drill and entered the femoral canal and took 12 off the distal femur due to the flexion contracture and performed a distal femoral cut.  Soft tissue was protected at all times.  We then sized the femur up the anterior cortex, it was sized to a 6.  We then put our chamfer cutter anteroposterior and a chamfer cut was performed.  Soft tissue was protected at all times posteriorly with a curved Crego.  This was satisfactory.  Attention was turned towards the tibia.  It was subluxed. External alignment guide utilized, bisecting the canal parallel to the tibia.  We took basically 10 off the high side and then a distal 2 off the low side.  This was pinned, a cut was made.  Soft tissue was protected posteriorly.  After this was performed, we checked flexion- extension gaps and they were equivalent, checked posteriorly with the knee in slight flexion, and removed any remnants of the menisci. Popliteus was intact and preserved.  We measured the patella, it was interestingly very shallow in its center and high in a certain portion of that.  We set it at 14, clamped it in the plantar patella, sized it at the 41 medializing this and drilling peg holes back towards completing the tibia, then with the knee flexed, tibia subluxed.  We sized the proximal tibia at 6 just on the  medial side of the tibial tubercle.  Full coverage was noted, pinned it, drilled, essentially used a punch guide and then attention was turned towards completing of the femur.  Distal block guide was applied, centered.  Oscillating saw performed a block cut.  The remnant of the PCL was recessed as well.  We placed our trial components and 10 mm insert, there was  slight tightness in extension, but he had full flexion and good patellofemoral tracking.  We removed all components and released posterior with a curved Crego with remaining PCL and tight capsule medially.  This  was along the posterior aspect of the tibia.  I then placed the trials back in and we had full extension.  I then removed all instrumentation.  We used pulsatile lavage to clean the bony surfaces.  Following this, the knee was flexed.  All surfaces dried.  We mixed cement on the back table in appropriate fashion, injected it in the proximal tibia after drying and digitally pressurizing it.  We then impacted the 6 tray.  Redundant cement was removed.  We cemented the femur with cement on the runners, placed a 10, reduced it in extension and held throughout the curing, the redundant cement removed.  We cemented the patella as well.  After curing the cement, we removed any redundant cement with an osteotome meticulously, irrigated and placed bone wax on the cancellous surfaces. The trial had full extension, full flexion, good stability with varus valgus stressing 0 to 30 degrees; therefore selected the 10, removed it, checked again posteriorly, any redundant cement removed.  We put a permanent 10 mm insert after copious irrigation, it was reduced and in full extension, full flexion, good stability, varus valgus stressing 0 to 30 degrees.  After copious irrigation, placed a Hemovac, brought it out through a lateral stab wound on the skin, repaired the patellar arthrotomy in slight flexion with #1 Vicryl in interrupted figure-of- eight suture, good closure of the subcu with 2-0 Vicryl simple sutures. Skin was reapproximated with Monocryl, subcuticular closure and Steri- Strips applied.  He had flexion to gravity at 90 degrees.  The tourniquet was deflated, there was adequate vascularization of the lower extremity appreciated.  The patient tolerated the procedure well.  No complications.  Assistant was [employer].  Tourniquet time was 1 hour and 2 minutes.  No complications.     Susa Day, M.D.     Geralynn Rile  D:  08/01/2011  T:  08/01/2011  Job:  [id number]

## 2011-08-01 NOTE — Interval H&P Note (Signed)
History and Physical Interval Note:  08/01/2011 6:58 AM  Shane [name]  has presented today for surgery, with the diagnosis of RIGHT KNEE OSTEOARTHRITIS  The various methods of treatment have been discussed with the patient and family. After consideration of risks, benefits and other options for treatment, the patient has consented to  Procedure(s): TOTAL KNEE ARTHROPLASTY as a surgical intervention .  The patients' history has been reviewed, patient examined, no change in status, stable for surgery.  I have reviewed the patients' chart and labs.  Questions were answered to the patient's satisfaction.     Daril Warga C

## 2011-08-01 NOTE — Anesthesia Preprocedure Evaluation (Signed)
Anesthesia Evaluation  Patient identified by MRN, date of birth, ID band Patient awake    Reviewed: Allergy & Precautions, H&P , NPO status , Patient's Chart, lab work & pertinent test results, reviewed documented beta blocker date and time   Airway Mallampati: II TM Distance: >3 FB Neck ROM: Full    Dental   Pulmonary neg pulmonary ROS,    + decreased breath sounds      Cardiovascular hypertension, Pt. on medications + CAD and + Past MI Regular Normal CAD, s/p CABG 1985. Currently asymptomatic. Given CV clearance. Nl cardiolite Nov 2012   Neuro/Psych Negative Neurological ROS  Negative Psych ROS   GI/Hepatic negative GI ROS, Neg liver ROS,   Endo/Other  Diabetes mellitus-, Well Controlled, Type 2, Oral Hypoglycemic Agents  Renal/GU Elevated Cr  1.80  Genitourinary negative   Musculoskeletal negative musculoskeletal ROS (+)   Abdominal   Peds negative pediatric ROS (+)  Hematology negative hematology ROS (+)   Anesthesia Other Findings   Reproductive/Obstetrics negative OB ROS                           Anesthesia Physical Anesthesia Plan  ASA: III  Anesthesia Plan: General   Post-op Pain Management:    Induction: Intravenous  Airway Management Planned: Oral ETT  Additional Equipment:   Intra-op Plan:   Post-operative Plan: Extubation in OR  Informed Consent: I have reviewed the patients History and Physical, chart, labs and discussed the procedure including the risks, benefits and alternatives for the proposed anesthesia with the patient or authorized representative who has indicated his/her understanding and acceptance.     Plan Discussed with: CRNA and Surgeon  Anesthesia Plan Comments:         Anesthesia Quick Evaluation

## 2011-08-01 NOTE — Anesthesia Postprocedure Evaluation (Signed)
  Anesthesia Post-op Note  Patient: Shane [name]  Procedure(s) Performed:  TOTAL KNEE ARTHROPLASTY  Patient Location: PACU  Anesthesia Type: GA combined with regional for post-op pain  Level of Consciousness: oriented and sedated  Airway and Oxygen Therapy: Patient Spontanous Breathing and Patient connected to nasal cannula oxygen  Post-op Pain: mild  Post-op Assessment: Post-op Vital signs reviewed, Patient's Cardiovascular Status Stable, Respiratory Function Stable and Patent Airway  Post-op Vital Signs: stable  Complications: No apparent anesthesia complications

## 2011-08-01 NOTE — Progress Notes (Signed)
Knee x-ray done this am  (rT KNEE)

## 2011-08-01 NOTE — Anesthesia Procedure Notes (Addendum)
Anesthesia Regional Block:  Femoral nerve block  Pre-Anesthetic Checklist: ,, timeout performed, Correct Patient, Correct Site, Correct Laterality, Correct Procedure, Correct Position, site marked, Risks and benefits discussed, Surgical consent,  Pre-op evaluation,  At surgeon's request  Laterality: Right  Prep: chloraprep       Needles:  Injection technique: Single-shot  Needle Type: Echogenic Needle     Needle Length: 10cm 10 cm Needle Gauge: 21 G  Needle insertion depth: 3 cm   Additional Needles:  Procedures: ultrasound guided Femoral nerve block Narrative:  Start time: 08/01/2011 7:10 AM End time: 08/01/2011 7:20 AM  Performed by: Personally  Anesthesiologist: Thereasa Parkin  Femoral nerve block

## 2011-08-01 NOTE — H&P (Signed)
Chief Complaint:  right knee pain.  Subjective: Patient is admitted for right total knee arthroplasty.  Patient is a 77 y.o. male who has been seen by Dr. Tonita Cong for ongoing hip pain.  They have been followed and found to have continued progressive pain.  They have been treated conservatively in the past including medications.  Despite conservative measures, they continue to have pain.  X-rays show that the patient has tri compartmental OA.  It is felt that they would benefit from undergoing surgical intervention.  Risks and benefits have been discussed with the patient and they elect to proceed with surgery.  The patient has no contraindications to the upcoming procedure such as ongoing infection or progressive neurological disease.  Allergies: Allergies  Allergen Reactions  . Azithromycin Rash     Medications: Lipitor Niaspan Zolpidem Diovan/HCT Norvasc Metformin Pulmicort Respules Toprol XL Xalatan Spironolactone Aspirin Tylenol Benedryl MVI B-complex Vitamin D-3 Cialis   Past Medical History: Past Medical History  Diagnosis Date  . Coronary artery disease     remote CABG in 1985, cath in 2003 by Dr. Lia Foyer with no PCI, last nuclear in 2010 showing scar and EF of 28%.   . Dyslipidemia   . Hyperkalemia   . Hypercholesterolemia   . Left ventricular dysfunction     28% per nuclear in 2010 and 35 to 40% per echo in 2010  . HTN (hypertension)   . Diabetes mellitus   . Cellulitis of arm, left     MSSA  . CKD (chronic kidney disease), stage III   . Spinal stenosis   . BPH (benign prostatic hyperplasia)   . Allergic rhinitis   . Abnormal nuclear cardiac imaging test Nov 2010    moderate area of infarct in the inferior wall with only minimal reversibility and EF of 28%  . ED (erectile dysfunction)   . Myocardial infarction     1985  . Pneumonia     hx of several times years ago   . Blood transfusion     ? at time of bypass surgery   . Arthritis    osteoarthritis      Past Surgical History: Past Surgical History  Procedure Date  . Cardiac catheterization 2003  . Coronary artery bypass graft 1985  . Lumbar disc surgery   . Prostate surgery   . Back surgery     hx of back surgery x 4      Family History: Family History  Problem Relation Age of Onset  . Heart attack Father   . Heart disease Father     Social History: History  Substance Use Topics  . Smoking status: Former Smoker -- 5 years    Types: Cigarettes    Quit date: 08/05/1958  . Smokeless tobacco: Never Used  . Alcohol Use: No     Review of Systems Constitutional: negative for fatigue, fevers and sweats Ears, nose, mouth, throat, and face: negative for earaches, epistaxis and tinnitus Respiratory: negative for cough, dyspnea on exertion and pleurisy/chest pain Cardiovascular: negative for chest pressure/discomfort, dyspnea, irregular heart beat and orthopnea Gastrointestinal: negative for abdominal pain, constipation and diarrhea Genitourinary:negative except for urinary frequency  Physical Exam:  Vitals: Pulse:76 Respirations:10 Blood Pressure:140/96  General Appearance:    Alert, cooperative, no distress, appears stated age  Head:    Normocephalic, without obvious abnormality, atraumatic  Eyes:    PERRL, conjunctiva/corneas clear, EOM's intact, fundi    benign, both eyes       Ears:  Normal TM's and external ear canals, both ears  Nose:   Nares normal, septum midline, mucosa normal, no drainage    or sinus tenderness  Throat:   Lips, mucosa, and tongue normal; teeth and gums normal  Neck:   Supple, symmetrical, trachea midline, no adenopathy;       thyroid:  No enlargement/tenderness/nodules; no carotid   bruit or JVD  Back:     Symmetric, no curvature, ROM normal, no CVA tenderness  Lungs:     Clear to auscultation bilaterally, respirations unlabored  Chest wall:    No tenderness or deformity  Heart:    Regular rate and rhythm, S1 and S2  normal, no murmur, rub   or gallop  Abdomen:     Soft, non-tender, bowel sounds active all four quadrants,    no masses, no organomegaly        Extremities:   Mild effusion, TTP medial joint line, -2 to 110 right  Pulses:   2+ and symmetric all extremities  Skin:   Skin color, texture, turgor normal, no rashes or lesions          Assessment/Plan: End stage arthritis, right knee  The patient is being admitted to [facility] to undergo a right total knee arthroplasty.  Surgery will be performed by Dr. Susa Day.  Risks and benefits have been discussed with the patient and they elect to proceed wth the procedure.  Patient was given prescriptions for pain meds and Xarelto at [facility] visit.  He was cleared by Dr Mare Ferrari for surgery. Patient has decreased LV function so monitor peri operative fluids closely.

## 2011-08-01 NOTE — Transfer of Care (Signed)
Immediate Anesthesia Transfer of Care Note  Patient: Shane [name]  Procedure(s) Performed:  TOTAL KNEE ARTHROPLASTY  Patient Location: PACU  Anesthesia Type: General  Level of Consciousness: awake, alert  and oriented  Airway & Oxygen Therapy: Patient Spontanous Breathing and Patient connected to face mask oxygen  Post-op Assessment: Report given to PACU RN and Post -op Vital signs reviewed and stable  Post vital signs: Reviewed and stable  Complications: No apparent anesthesia complications

## 2011-08-01 NOTE — Progress Notes (Signed)
PHARMACIST - PHYSICIAN COMMUNICATION DR:  Tonita Cong CONCERNING:  METFORMIN SAFE ADMINISTRATION POLICY  RECOMMENDATION: Metformin has been placed on DISCONTINUE (rejected order) STATUS and should be reordered only after any of the conditions below are ruled out.  Current safety recommendations include avoiding metformin for a minimum of 48 hours after the patient's exposure to intravenous contrast media.  DESCRIPTION:  The Pharmacy Committee has adopted a policy that restricts the use of metformin in hospitalized patients until all the contraindications to administration have been ruled out. Specific contraindications are: [x]  Serum creatinine ? 1.5 for males []  Serum creatinine ? 1.4 for females []  Shock, acute MI, sepsis, hypoxemia, dehydration []  Planned administration of intravenous iodinated contrast media []  Heart Failure patients with low EF []  Acute or chronic metabolic acidosis (including DKA)     Shane [name], 08/01/2011, 12:43 PM

## 2011-08-01 NOTE — Brief Op Note (Signed)
08/01/2011  10:06 AM  PATIENT:  Shane [name]  77 y.o. male  PRE-OPERATIVE DIAGNOSIS:  RIGHT KNEE OSTEOARTHRITIS  POST-OPERATIVE DIAGNOSIS:  RIGHT KNEE OSTEOARTHRITIS  PROCEDURE:  Procedure(s): TOTAL KNEE ARTHROPLASTY  SURGEON:  Surgeon(s): [employer]  PHYSICIAN ASSISTANT:   ASSISTANTS: chabon   ANESTHESIA:   general  EBL:  Total I/O In: 1800 [I.V.:1800] Out: 300 [Urine:250; Blood:50]  BLOOD ADMINISTERED:none  DRAINS: (1) Hemovact drain(s) in the knee with  Suction Open   LOCAL MEDICATIONS USED:  MARCAINE 20CC  SPECIMEN:  No Specimen  DISPOSITION OF SPECIMEN:  N/A  COUNTS:  YES  TOURNIQUET:   Total Tourniquet Time Documented: Thigh (Right) - 102 minutes  DICTATION: .Other Dictation: Dictation Number   [id number]  PLAN OF CARE: Admit to inpatient   PATIENT DISPOSITION:  PACU - hemodynamically stable.   Delay start of Pharmacological VTE agent (>24hrs) due to surgical blood loss or risk of bleeding:  {YES/NO/NOT APPLICABLE:20182

## 2011-08-01 NOTE — Plan of Care (Signed)
Problem: Consults Goal: Diagnosis- Total Joint Replacement Outcome: Progressing Primary Total Knee     

## 2011-08-01 NOTE — Progress Notes (Signed)
Utilization review completed.  

## 2011-08-02 ENCOUNTER — Encounter (HOSPITAL_COMMUNITY): Payer: Self-pay | Admitting: Specialist

## 2011-08-02 LAB — BASIC METABOLIC PANEL
CO2: 26 mEq/L (ref 19–32)
Calcium: 8.6 mg/dL (ref 8.4–10.5)
Glucose, Bld: 155 mg/dL — ABNORMAL HIGH (ref 70–99)
Sodium: 134 mEq/L — ABNORMAL LOW (ref 135–145)

## 2011-08-02 LAB — CBC
HCT: 36.9 % — ABNORMAL LOW (ref 39.0–52.0)
Hemoglobin: 11.7 g/dL — ABNORMAL LOW (ref 13.0–17.0)
MCH: 27.7 pg (ref 26.0–34.0)
MCV: 87.4 fL (ref 78.0–100.0)
RBC: 4.22 MIL/uL (ref 4.22–5.81)

## 2011-08-02 LAB — GLUCOSE, CAPILLARY
Glucose-Capillary: 130 mg/dL — ABNORMAL HIGH (ref 70–99)
Glucose-Capillary: 161 mg/dL — ABNORMAL HIGH (ref 70–99)

## 2011-08-02 MED ORDER — BISACODYL 5 MG PO TBEC
10.0000 mg | DELAYED_RELEASE_TABLET | Freq: Once | ORAL | Status: AC
  Administered 2011-08-02: 10 mg via ORAL
  Filled 2011-08-02: qty 2

## 2011-08-02 NOTE — Progress Notes (Signed)
08/02/2011 Fredonia Highland BSN CCM [phone] CM spoke with patient. Plans are for pt to return to his home in [facility] where his spouse will be caregiver. He states she already has RW, BSC. Plans to have spouse bring his walker to Hospital to have physical therapist adjust it if needed. States he lives in 1 level home and has one step to entrance. He is requesting [facility] for [facility] services as ordered. If advanced unable to provide services 2nd choice [facility]. CM spoke with [facility] who will be unable to provide services d/t abundance of [place] services requests. Spoke with Ola Spurr who can provide HHpt services; she will also arrange for cpm machine.

## 2011-08-02 NOTE — Progress Notes (Signed)
Subjective: 1 Day Post-Op Procedure(s) (LRB): TOTAL KNEE ARTHROPLASTY (Right) Patient reports pain as 4 on 0-10 scale.    Objective: Vital signs in last 24 hours: Temp:  [97.8 F (36.6 C)-100.4 F (38 C)] 100.4 F (38 C) (12/28 0546) Pulse Rate:  [85-105] 102  (12/28 0546) Resp:  [10-21] 16  (12/28 0546) BP: (111-161)/(67-89) 124/74 mmHg (12/28 0546) SpO2:  [94 %-100 %] 96 % (12/28 0546) Weight:  [93.895 kg (207 lb)] 207 lb (93.895 kg) (12/27 1552)  Intake/Output from previous day: 12/27 0701 - 12/28 0700 In: 3052 [P.O.:360; I.V.:2535; IV Piggyback:157] Out: [id number] [Urine:1375; Drains:330; Blood:50] Intake/Output this shift:     [facility] 08/02/11 0426  HGB 11.7*    Basename 08/02/11 0426  WBC 10.4  RBC 4.22  HCT 36.9*  PLT 152    Basename 08/02/11 0426  NA 134*  K 4.4  CL 100  CO2 26  BUN 30*  CREATININE 1.89*  GLUCOSE 155*  CALCIUM 8.6   No results found for this basename: LABPT:2,INR:2 in the last 72 hours  Neurologically intact ABD soft Intact pulses distally Dorsiflexion/Plantar flexion intact No cellulitis present Compartment soft  Assessment/Plan: 1 Day Post-Op Procedure(s) (LRB): TOTAL KNEE ARTHROPLASTY (Right) Advance diet Up with therapy D/C IV fluids  Jakira Mcfadden C 08/02/2011, 7:03 AM

## 2011-08-02 NOTE — Progress Notes (Signed)
Physical Therapy Evaluation Patient Details Name: Shane [name] MRN: [id number] DOB: 09/19/1933 Today's Date: 08/02/2011 Problem List:   [id number] Ev 2  Patient Active Problem List  Diagnoses  . Ischemic heart disease  . Dyslipidemia  . Hyperkalemia  . Hypercholesterolemia  . Diabetes mellitus  . Benign hypertensive heart disease without heart failure  . Allergic rhinitis  . Osteoarthritis  . Cellulitis of left elbow and left elbow septic bursitis  . CAD s/p coronary arthery bypass graft  . BPH (benign prostatic hyperplasia)  . Kidney disease, chronic, stage III (GFR 30-59 ml/min)  . H/O Spinal stenosis    Past Medical History:  Past Medical History  Diagnosis Date  . Coronary artery disease     remote CABG in 1985, cath in 2003 by Dr. Lia Foyer with no PCI, last nuclear in 2010 showing scar and EF of 28%.   . Dyslipidemia   . Hyperkalemia   . Hypercholesterolemia   . Left ventricular dysfunction     28% per nuclear in 2010 and 35 to 40% per echo in 2010  . HTN (hypertension)   . Diabetes mellitus   . Cellulitis of arm, left     MSSA  . CKD (chronic kidney disease), stage III   . Spinal stenosis   . BPH (benign prostatic hyperplasia)   . Allergic rhinitis   . Abnormal nuclear cardiac imaging test Nov 2010    moderate area of infarct in the inferior wall with only minimal reversibility and EF of 28%  . ED (erectile dysfunction)   . Myocardial infarction     1985  . Pneumonia     hx of several times years ago   . Blood transfusion     ? at time of bypass surgery   . Arthritis     osteoarthritis   . Neuromuscular disorder    Past Surgical History:  Past Surgical History  Procedure Date  . Cardiac catheterization 2003  . Coronary artery bypass graft 1985  . Lumbar disc surgery   . Prostate surgery   . Back surgery     hx of back surgery x 4     PT Assessment/Plan/Recommendation PT Assessment Clinical Impression Statement: pt will benefit from PT to  maximize independence PT Recommendation/Assessment: Patient will need skilled PT in the acute care venue PT Problem List: Decreased strength;Decreased range of motion;Decreased activity tolerance;Decreased balance;Decreased mobility;Decreased knowledge of use of DME;Pain Barriers to Discharge: None PT Therapy Diagnosis : Difficulty walking PT Plan PT Frequency: 7X/week PT Treatment/Interventions: DME instruction;Gait training;Stair training;Functional mobility training;Therapeutic activities;Therapeutic exercise;Balance training;Patient/family education PT Recommendation Follow Up Recommendations: Home health PT Equipment Recommended: None recommended by PT PT Goals  Acute Rehab PT Goals PT Goal Formulation: With patient Time For Goal Achievement: 7 days Pt will go Supine/Side to Sit: with min assist PT Goal: Supine/Side to Sit - Progress: Not met Pt will go Sit to Supine/Side: with min assist PT Goal: Sit to Supine/Side - Progress: Not met Pt will go Sit to Stand: with supervision PT Goal: Sit to Stand - Progress: Progressing toward goal Pt will go Stand to Sit: with supervision PT Goal: Stand to Sit - Progress: Progressing toward goal Pt will Ambulate: 51 - 150 feet;with supervision;with rolling walker PT Goal: Ambulate - Progress: Progressing toward goal Pt will Go Up / Down Stairs: 1-2 stairs;with least restrictive assistive device;with min assist PT Goal: Up/Down Stairs - Progress: Not met  PT Evaluation Precautions/Restrictions  Precautions Precautions: Knee Precaution Comments: no pillow under  knee Knee Immobilizer:  (not specified) Restrictions RLE Weight Bearing: Weight bearing as tolerated Prior Functioning  Home Living Lives With: Spouse Receives Help From: Family Type of Home: House Home Layout: One level Home Access: Stairs to enter [employer] of Steps: 1 Home Adaptive Equipment: Straight cane Prior Function Level of Independence: Independent  with gait;Independent with transfers Driving: Yes Comments: pastor Cognition Cognition Arousal/Alertness: Awake/alert Overall Cognitive Status: Appears within functional limits for tasks assessed Orientation Level: Oriented X4 Sensation/Coordination   Extremity Assessment RUE Assessment RUE Assessment: Within Functional Limits LUE Assessment LUE Assessment: Within Functional Limits RLE Assessment RLE Assessment:  (able to assist minimally with SLR, ankle WFL) LLE Assessment LLE Assessment: Within Functional Limits Mobility (including Balance) Bed Mobility Bed Mobility: No Transfers Sit to Stand: 4: Min assist;With armrests;From chair/3-in-1 Sit to Stand Details (indicate cue type and reason): assist with balance, cues for hands and LE position Stand to Sit: 4: Min assist;To chair/3-in-1 Stand to Sit Details: cues for hands and LE position Ambulation/Gait Ambulation/Gait: Yes Ambulation/Gait Assistance: 4: Min assist Ambulation/Gait Assistance Details (indicate cue type and reason): cues sequence Ambulation Distance (Feet): 50 Feet Assistive device: Rolling walker Gait Pattern: Step-to pattern;Antalgic    Exercise  Total Joint Exercises Ankle Circles/Pumps: AROM;Both;10 reps Quad Sets: 10 reps;Right End of Session PT - End of Session Activity Tolerance: Patient tolerated treatment well Patient left: in chair;with family/visitor present General Behavior During Session: [facility] for tasks performed Cognition: [facility] for tasks performed  [facility] 08/02/2011, 11:10 AM

## 2011-08-02 NOTE — Progress Notes (Signed)
Physical Therapy Treatment Patient Details Name: Shane [name] MRN: [id number] DOB: September 11, 1933 Today's Date: 08/02/2011 [id number] 1 ta 1 te  PT Assessment/Plan  PT - Assessment/Plan Comments on Treatment Session: pt doing well; should progress nicely PT Plan: Discharge plan remains appropriate PT Frequency: 7X/week Follow Up Recommendations: Home health PT Equipment Recommended: None recommended by PT PT Goals  Acute Rehab PT Goals Pt will go Sit to Supine/Side: with min assist PT Goal: Sit to Supine/Side - Progress: Progressing toward goal PT Goal: Sit to Stand - Progress: Progressing toward goal PT Goal: Stand to Sit - Progress: Progressing toward goal PT Goal: Ambulate - Progress: Progressing toward goal  PT Treatment Precautions/Restrictions  Precautions Precautions: Knee Precaution Comments: no pillow under knee Knee Immobilizer:  (not specified by MD) Restrictions Weight Bearing Restrictions: Yes RLE Weight Bearing: Weight bearing as tolerated Mobility (including Balance) Bed Mobility Bed Mobility: Yes Sit to Supine - Right: 4: Min assist;HOB flat Sit to Supine - Right Details (indicate cue type and reason): cues for technique, assist with RLE Transfers Transfers: Yes Sit to Stand: 4: Min assist;With armrests;From chair/3-in-1 Sit to Stand Details (indicate cue type and reason): assist with balance, cues for hands and LE position Stand to Sit: 4: Min assist;To bed Stand to Sit Details: cues for hands and LE position Ambulation/Gait Ambulation/Gait: Yes Ambulation/Gait Assistance: 4: Min assist Ambulation/Gait Assistance Details (indicate cue type and reason): cues for sequence and RW position Ambulation Distance (Feet): 5 Feet  Chair to bed, WBing painful Assistive device: Rolling walker Gait Pattern: Step-to pattern;Antalgic    Exercise  Total Joint Exercises Ankle Circles/Pumps: AROM;Both;10 reps Quad Sets: 10 reps;Right Short Arc QuadSinclair Ship;Right;10  reps Heel Slides: AAROM;Right;10 reps Straight Leg Raises: AAROM;Right;10 reps End of Session PT - End of Session Equipment Utilized During Treatment: Right knee immobilizer Activity Tolerance: Patient tolerated treatment well Patient left: in bed;with family/visitor present General Behavior During Session: [facility] for tasks performed Cognition: [facility] for tasks performed  [facility] 08/02/2011, 1:41 PM

## 2011-08-02 NOTE — Progress Notes (Signed)
CSW consulted for D/C planning. PN reviewed. Pt planning to return home upon D/C from hospital. RNCM is assisting with [facility] needs. CSW is avaliable to assist if plan changes and SNF placement is needed.

## 2011-08-03 ENCOUNTER — Encounter (HOSPITAL_COMMUNITY): Payer: Self-pay | Admitting: *Deleted

## 2011-08-03 LAB — CBC
HCT: 34.4 % — ABNORMAL LOW (ref 39.0–52.0)
Hemoglobin: 11.4 g/dL — ABNORMAL LOW (ref 13.0–17.0)
MCH: 28.7 pg (ref 26.0–34.0)
MCHC: 33.1 g/dL (ref 30.0–36.0)
MCV: 86.6 fL (ref 78.0–100.0)
Platelets: 146 K/uL — ABNORMAL LOW (ref 150–400)
RBC: 3.97 MIL/uL — ABNORMAL LOW (ref 4.22–5.81)
RDW: 14.7 % (ref 11.5–15.5)
WBC: 9.2 K/uL (ref 4.0–10.5)

## 2011-08-03 LAB — GLUCOSE, CAPILLARY
Glucose-Capillary: 152 mg/dL — ABNORMAL HIGH (ref 70–99)
Glucose-Capillary: 137 mg/dL — ABNORMAL HIGH (ref 70–99)
Glucose-Capillary: 137 mg/dL — ABNORMAL HIGH (ref 70–99)
Glucose-Capillary: 149 mg/dL — ABNORMAL HIGH (ref 70–99)

## 2011-08-03 LAB — BASIC METABOLIC PANEL WITH GFR
BUN: 26 mg/dL — ABNORMAL HIGH (ref 6–23)
CO2: 27 meq/L (ref 19–32)
Calcium: 8.7 mg/dL (ref 8.4–10.5)
Chloride: 97 meq/L (ref 96–112)
Creatinine, Ser: 1.66 mg/dL — ABNORMAL HIGH (ref 0.50–1.35)
GFR calc Af Amer: 45 mL/min — ABNORMAL LOW (ref >90)
GFR calc non Af Amer: 39 mL/min — ABNORMAL LOW (ref >90)
Glucose, Bld: 152 mg/dL — ABNORMAL HIGH (ref 70–99)
Potassium: 4.2 meq/L (ref 3.5–5.1)
Sodium: 133 meq/L — ABNORMAL LOW (ref 135–145)

## 2011-08-03 MED ORDER — POLYETHYLENE GLYCOL 3350 17 G PO PACK
17.0000 g | PACK | Freq: Two times a day (BID) | ORAL | Status: DC
  Administered 2011-08-03 – 2011-08-04 (×3): 17 g via ORAL
  Filled 2011-08-03 (×4): qty 1

## 2011-08-03 NOTE — Progress Notes (Signed)
Occupational Therapy Evaluation Patient Details Name: Shane [name] MRN: [id number] DOB: 1934-07-17 Today's Date: 08/03/2011 1130 25 ev2 Problem List:  Patient Active Problem List  Diagnoses  . Ischemic heart disease  . Dyslipidemia  . Hyperkalemia  . Hypercholesterolemia  . Diabetes mellitus  . Benign hypertensive heart disease without heart failure  . Allergic rhinitis  . Osteoarthritis  . Cellulitis of left elbow and left elbow septic bursitis  . CAD s/p coronary arthery bypass graft  . BPH (benign prostatic hyperplasia)  . Kidney disease, chronic, stage III (GFR 30-59 ml/min)  . H/O Spinal stenosis    Past Medical History:  Past Medical History  Diagnosis Date  . Coronary artery disease     remote CABG in 1985, cath in 2003 by Dr. Lia Foyer with no PCI, last nuclear in 2010 showing scar and EF of 28%.   . Dyslipidemia   . Hyperkalemia   . Hypercholesterolemia   . Left ventricular dysfunction     28% per nuclear in 2010 and 35 to 40% per echo in 2010  . HTN (hypertension)   . Diabetes mellitus   . Cellulitis of arm, left     MSSA  . CKD (chronic kidney disease), stage III   . Spinal stenosis   . BPH (benign prostatic hyperplasia)   . Allergic rhinitis   . Abnormal nuclear cardiac imaging test Nov 2010    moderate area of infarct in the inferior wall with only minimal reversibility and EF of 28%  . ED (erectile dysfunction)   . Myocardial infarction     1985  . Pneumonia     hx of several times years ago   . Blood transfusion     ? at time of bypass surgery   . Arthritis     osteoarthritis   . Neuromuscular disorder    Past Surgical History:  Past Surgical History  Procedure Date  . Cardiac catheterization 2003  . Coronary artery bypass graft 1985  . Lumbar disc surgery   . Prostate surgery   . Back surgery     hx of back surgery x 4   . Total knee arthroplasty 08/01/2011    Procedure: TOTAL KNEE ARTHROPLASTY;  Surgeon: Johnn Hai;  Location:  [facility] ORS;  Service: Orthopedics;  Laterality: Right;    OT Assessment/Plan/Recommendation OT Assessment Clinical Impression Statement: This 77 yo man was admitted for R TKA.  OT will follow him in acute to educate him on bathroom transfer techniques.  All ofther education was done, and pt will not need follow up OT after this.   OT Recommendation/Assessment: Patient will need skilled OT in the acute care venue OT Problem List: Decreased activity tolerance;Decreased strength;Decreased knowledge of use of DME or AE;Pain OT Therapy Diagnosis : Generalized weakness OT Plan OT Frequency: Min 2X/week OT Treatment/Interventions: Self-care/ADL training;DME and/or AE instruction;Patient/family education OT Recommendation Follow Up Recommendations: None Equipment Recommended: None recommended by OT Individuals Consulted Consulted and Agree with Results and Recommendations: Patient OT Goals Acute Rehab OT Goals OT Goal Formulation: With patient Time For Goal Achievement: 7 days ADL Goals Pt Will Perform Grooming: with supervision;Standing at sink ADL Goal: Grooming - Progress: Not met Pt Will Transfer to Toilet: with supervision;3-in-1;Ambulation ADL Goal: Toilet Transfer - Progress: Not met Pt Will Perform Toileting - Clothing Manipulation: with supervision;Standing ADL Goal: Toileting - Clothing Manipulation - Progress: Not met Pt Will Perform Toileting - Hygiene: with supervision;Standing at 3-in-1/toilet ADL Goal: Toileting - Hygiene - Progress: Not met  Pt Will Perform Tub/Shower Transfer: with min assist;Shower transfer  OT Evaluation Precautions/Restrictions  Precautions Precautions: Knee Precaution Comments: no pillow under knee Knee Immobilizer:  (not specified by MD) Restrictions Weight Bearing Restrictions: Yes RLE Weight Bearing: Weight bearing as tolerated Prior [place] Lives With: Spouse Receives Help From: Family Type of Home: House Home Layout: One  level Home Access: Stairs to enter [employer] of Steps: 1 Home Adaptive Equipment: Walker - rolling;Bedside commode/3-in-1 Walk in shower; 3:1 commode over toilet Prior Function Level of Independence: Independent with gait;Independent with transfers Driving: Yes ADL ADL Grooming: Simulated;Set up Where Assessed - Grooming: Sitting, chair;Supported Artist Bathing: Performed;Set up Where Assessed - Upper Body Bathing: Sitting, chair;Supported Lower Body Bathing: Performed;Moderate assistance;Other (comment) (educated on ae for LB adls) Where Assessed - Lower Body Bathing: Sit to stand from chair Upper Body Dressing: Performed;Minimal assistance;Other (comment) (iv) Where Assessed - Upper Body Dressing: Sitting, chair;Supported Lower Body Dressing: Simulated;Maximal assistance Where Assessed - Lower Body Dressing: Sit to stand from chair Toileting - Clothing Manipulation: Simulated;Moderate assistance Where Assessed - Toileting Clothing Manipulation: Sit to stand from 3-in-1 or toilet Toileting - Hygiene: Simulated;Moderate assistance Where Assessed - Toileting Hygiene: Sit to stand from 3-in-1 or toilet Equipment Used: Rolling walker;Other (comment) (demonstrated reacher; pt has used sockaid;wife will help) ADL Comments: Pt limited by pain.  Will instruct in shower transfer on next visit  Wife came at end of session.  She has undergone TKA.  They have now converted tub to walk in shower Vision/Perception  Vision - History Patient Visual Report: No change from baseline Cognition Cognition Overall Cognitive Status: Appears within functional limits for tasks assessed Sensation/Coordination   Extremity Assessment RUE Assessment RUE Assessment: Within Functional Limits LUE Assessment LUE Assessment: Within Functional Limits Mobility  Bed Mobility Bed Mobility: No (pt up in recliner) Transfers Transfers: Yes Sit to Stand: 4: Min assist;With upper extremity  assist;From chair/3-in-1 Sit to Stand Details (indicate cue type and reason): assist for weakness and steadying upon standing, verbal cues for hand placement Stand to Sit: 4: Min assist;With upper extremity assist;To chair/3-in-1 Stand to Sit Details: verbal cues for hand placement and R LE forward, pt with slow controlled descent until end of transfer and then plopped, instructed to control descent with no plopping next time. Exercises   End of Session OT - End of Session Activity Tolerance: Patient limited by pain Patient left: in chair;with call bell in reach General Behavior During Session: [facility] for tasks performed Cognition: [facility] for tasks performed   Gertrue Willette [phone] 08/03/2011, 1:24 PM

## 2011-08-03 NOTE — Progress Notes (Signed)
Subjective: 2 Days Post-Op Procedure(s) (LRB): TOTAL KNEE ARTHROPLASTY (Right)   Patient reports pain as mild. No events. Didn't sleep well, otherwise progressing well.  Objective:   VITALS:   Filed Vitals:   08/03/11 0817  BP:   Pulse:   Temp:   Resp: 20    Neurovascular intact Dorsiflexion/Plantar flexion intact Incision: dressing C/D/I No cellulitis present Compartment soft  LABS  Basename 08/03/11 0527 08/02/11 0426  HGB 11.4* 11.7*  HCT 34.4* 36.9*  WBC 9.2 10.4  PLT 146* 152     Basename 08/03/11 0527 08/02/11 0426  NA 133* 134*  K 4.2 4.4  BUN 26* 30*  CREATININE 1.66* 1.89*  GLUCOSE 152* 155*     Assessment/Plan: 2 Days Post-Op Procedure(s) (LRB): TOTAL KNEE ARTHROPLASTY (Right)   Foley D/C'ed Dressing changed Up with therapy Plan for discharge tomorrow with [place]. Shane [name]   PAC  08/03/2011, 9:11 AM

## 2011-08-03 NOTE — Progress Notes (Signed)
Pt got his Pulmicort  this a.m. medication is from home. Pt states he put the medication down his nose for inflammatory. Pt states that medication is not for lung. RN aware of medication and how it is used.

## 2011-08-03 NOTE — Progress Notes (Signed)
Physical Therapy Treatment Patient Details Name: MICKY ENRIGUEZ MRN: [id number] DOB: 26-Apr-1934 Today's Date: 08/03/2011 Time: [id number] Charge: TE PT Assessment/Plan  PT - Assessment/Plan Comments on Treatment Session: Pt with increased R knee pain but agreeable to exercises. PT Plan: Discharge plan remains appropriate Follow Up Recommendations: Home health PT Equipment Recommended: None recommended by PT PT Goals  Acute Rehab PT Goals Pt will Perform Home Exercise Program: with supervision, verbal cues required/provided PT Goal: Perform Home Exercise Program - Progress: Progressing toward goal  PT Treatment Precautions/Restrictions  Precautions Precautions: Knee Precaution Comments: no pillow under knee Knee Immobilizer:  (not specified by MD) Restrictions Weight Bearing Restrictions: Yes RLE Weight Bearing: Weight bearing as tolerated Mobility (including Balance) Bed Mobility Bed Mobility: No   Exercise    End of Session PT - End of Session Equipment Utilized During Treatment: Right knee immobilizer Activity Tolerance: Patient limited by pain Patient left: in bed;with call bell in reach;with family/visitor present General Behavior During Session: [facility] for tasks performed Cognition: [facility] for tasks performed  Gearald Stonebraker,KATHrine E 08/03/2011, 3:59 PM Pager: [phone]

## 2011-08-03 NOTE — Progress Notes (Signed)
Physical Therapy Treatment Patient Details Name: Shane [name] MRN: [id number] DOB: 1934-06-15 Today's Date: 08/03/2011 Time: [id number] Charge: Elliot Cousin PT Assessment/Plan  PT - Assessment/Plan Comments on Treatment Session: Pt able to ambulate today but still having increased pain which he reports became worse with ambulation.  Ice packs applied end of session and encouraged PCA. PT Plan: Discharge plan remains appropriate Follow Up Recommendations: Home health PT Equipment Recommended: None recommended by PT PT Goals  Acute Rehab PT Goals PT Goal: Sit to Stand - Progress: Progressing toward goal PT Goal: Stand to Sit - Progress: Progressing toward goal PT Goal: Ambulate - Progress: Progressing toward goal  PT Treatment Precautions/Restrictions  Precautions Precautions: Knee Precaution Comments: no pillow under knee Knee Immobilizer:  (not specified by MD) Restrictions Weight Bearing Restrictions: Yes RLE Weight Bearing: Weight bearing as tolerated Mobility (including Balance) Bed Mobility Bed Mobility: No (pt up in recliner) Transfers Transfers: Yes Sit to Stand: 3: Mod assist;With armrests;From chair/3-in-1 Sit to Stand Details (indicate cue type and reason): assist for weakness and steadying upon standing, verbal cues for hand placement Stand to Sit: 4: Min assist;With armrests;To chair/3-in-1 Stand to Sit Details: verbal cues for hand placement and R LE forward, pt with slow controlled descent until end of transfer and then plopped, instructed to control descent with no plopping next time. Ambulation/Gait Ambulation/Gait: Yes Ambulation/Gait Assistance: 4: Min assist Ambulation/Gait Assistance Details (indicate cue type and reason): assist upon initiating gait 2* shaky UEs which decreased after approx a minute, verbal cues for sequence and safe RW distance Ambulation Distance (Feet): 160 Feet Assistive device: Rolling walker Gait Pattern: Step-to pattern;Antalgic      Exercise    End of Session PT - End of Session Equipment Utilized During Treatment: Right knee immobilizer Activity Tolerance: Patient limited by pain Patient left: in chair;with call bell in reach General Behavior During Session: [facility] for tasks performed Cognition: [facility] for tasks performed  Jazmeen Axtell,KATHrine E 08/03/2011, 12:57 PM Pager: [phone]

## 2011-08-04 LAB — BASIC METABOLIC PANEL
CO2: 28 mEq/L (ref 19–32)
Chloride: 96 mEq/L (ref 96–112)
Potassium: 4.3 mEq/L (ref 3.5–5.1)
Sodium: 131 mEq/L — ABNORMAL LOW (ref 135–145)

## 2011-08-04 LAB — CBC
Platelets: 146 10*3/uL — ABNORMAL LOW (ref 150–400)
RBC: 3.96 MIL/uL — ABNORMAL LOW (ref 4.22–5.81)
WBC: 9.4 10*3/uL (ref 4.0–10.5)

## 2011-08-04 LAB — GLUCOSE, CAPILLARY: Glucose-Capillary: 138 mg/dL — ABNORMAL HIGH (ref 70–99)

## 2011-08-04 MED ORDER — OXYCODONE-ACETAMINOPHEN 5-325 MG PO TABS: 1.0000 | ORAL_TABLET | ORAL | Status: AC | PRN

## 2011-08-04 MED ORDER — METHOCARBAMOL 500 MG PO TABS: 500.0000 mg | ORAL_TABLET | Freq: Four times a day (QID) | ORAL | Status: AC | PRN

## 2011-08-04 MED ORDER — RIVAROXABAN 10 MG PO TABS: 10.0000 mg | ORAL_TABLET | Freq: Every day | ORAL | Status: DC

## 2011-08-04 NOTE — Discharge Summary (Signed)
Physician Discharge Summary   Patient ID: Shane [name] MRN: [id number] DOB/AGE: 75-Aug-1935 75 y.o.  Admit date: 08/01/2011 Discharge date: 08/04/2011  Primary Diagnosis: OA right Knee  Admission Diagnoses: Past Medical History  Diagnosis Date  . Coronary artery disease     remote CABG in 1985, cath in 2003 by Dr. Lia Foyer with no PCI, last nuclear in 2010 showing scar and EF of 28%.   . Dyslipidemia   . Hyperkalemia   . Hypercholesterolemia   . Left ventricular dysfunction     28% per nuclear in 2010 and 35 to 40% per echo in 2010  . HTN (hypertension)   . Diabetes mellitus   . Cellulitis of arm, left     MSSA  . CKD (chronic kidney disease), stage III   . Spinal stenosis   . BPH (benign prostatic hyperplasia)   . Allergic rhinitis   . Abnormal nuclear cardiac imaging test Nov 2010    moderate area of infarct in the inferior wall with only minimal reversibility and EF of 28%  . ED (erectile dysfunction)   . Myocardial infarction     1985  . Pneumonia     hx of several times years ago   . Blood transfusion     ? at time of bypass surgery   . Arthritis     osteoarthritis   . Neuromuscular disorder     Discharge Diagnoses:  Active Problems:  * No active hospital problems. *    Procedure: Procedure(s) (LRB): TOTAL KNEE ARTHROPLASTY (Right)   Consults: none  HPI: Shane [name] 77 y.o. male with refractory OA of the right knee. Now presents for total knee procedure        Laboratory Data: Hospital Outpatient Visit on 07/23/2011  Component Date Value Range Status  . WBC (K/uL) 07/23/2011 5.6  4.0-10.5 Final  . RBC (MIL/uL) 07/23/2011 4.85  4.22-5.81 Final  . Hemoglobin (g/dL) 07/23/2011 13.7  13.0-17.0 Final  . HCT (%) 07/23/2011 42.2  39.0-52.0 Final  . MCV (fL) 07/23/2011 87.0  78.0-100.0 Final  . MCH (pg) 07/23/2011 28.2  26.0-34.0 Final  . MCHC (g/dL) 07/23/2011 32.5  30.0-36.0 Final  . RDW (%) 07/23/2011 14.9  11.5-15.5 Final  . Platelets  (K/uL) 07/23/2011 144* 150-400 Final  . Neutrophils Relative (%) 07/23/2011 53  43-77 Final  . Neutro Abs (K/uL) 07/23/2011 3.0  1.7-7.7 Final  . Lymphocytes Relative (%) 07/23/2011 27  12-46 Final  . Lymphs Abs (K/uL) 07/23/2011 1.5  0.7-4.0 Final  . Monocytes Relative (%) 07/23/2011 15* 3-12 Final  . Monocytes Absolute (K/uL) 07/23/2011 0.8  0.1-1.0 Final  . Eosinophils Relative (%) 07/23/2011 5  0-5 Final  . Eosinophils Absolute (K/uL) 07/23/2011 0.3  0.0-0.7 Final  . Basophils Relative (%) 07/23/2011 0  0-1 Final  . Basophils Absolute (K/uL) 07/23/2011 0.0  0.0-0.1 Final  . Prothrombin Time (seconds) 07/23/2011 14.4  11.6-15.2 Final  . INR  07/23/2011 1.10  0.00-1.49 Final  . Color, Urine  07/23/2011 YELLOW  YELLOW Final  . APPearance  07/23/2011 CLEAR  CLEAR Final  . Specific Gravity, Urine  07/23/2011 1.025  1.005-1.030 Final  . pH  07/23/2011 5.5  5.0-8.0 Final  . Glucose, UA (mg/dL) 07/23/2011 NEGATIVE  NEGATIVE Final  . Hgb urine dipstick  07/23/2011 NEGATIVE  NEGATIVE Final  . Bilirubin Urine  07/23/2011 SMALL* NEGATIVE Final  . Ketones, ur (mg/dL) 07/23/2011 TRACE* NEGATIVE Final  . Protein, ur (mg/dL) 07/23/2011 >300* NEGATIVE Final  . Urobilinogen, UA (mg/dL) 07/23/2011  0.2  0.0-1.0 Final  . Nitrite  07/23/2011 NEGATIVE  NEGATIVE Final  . Leukocytes, UA  07/23/2011 NEGATIVE  NEGATIVE Final  . MRSA, PCR  07/23/2011 NEGATIVE  NEGATIVE Final  . Staphylococcus aureus  07/23/2011 POSITIVE* NEGATIVE Final   Comment:                                 The Xpert SA Assay (FDA                          approved for NASAL specimens                          only), is one component of                          a comprehensive surveillance                          program.  It is not intended                          to diagnose infection nor to                          guide or monitor treatment.  . Sodium (mEq/L) 07/23/2011 137  135-145 Final  . Potassium (mEq/L) 07/23/2011 4.1   3.5-5.1 Final  . Chloride (mEq/L) 07/23/2011 104  96-112 Final  . CO2 (mEq/L) 07/23/2011 25  19-32 Final  . Glucose, Bld (mg/dL) 07/23/2011 110* 70-99 Final  . BUN (mg/dL) 07/23/2011 35* 6-23 Final  . Creatinine, Ser (mg/dL) 07/23/2011 1.80* 0.50-1.35 Final  . Calcium (mg/dL) 07/23/2011 10.0  8.4-10.5 Final  . Total Protein (g/dL) 07/23/2011 7.1  6.0-8.3 Final  . Albumin (g/dL) 07/23/2011 3.9  3.5-5.2 Final  . AST (U/L) 07/23/2011 33  0-37 Final  . ALT (U/L) 07/23/2011 26  0-53 Final  . Alkaline Phosphatase (U/L) 07/23/2011 58  39-117 Final  . Total Bilirubin (mg/dL) 07/23/2011 0.6  0.3-1.2 Final  . GFR calc non Af Amer (mL/min) 07/23/2011 35* >90 Final  . GFR calc Af Amer (mL/min) 07/23/2011 41* >90 Final   Comment:                                 The eGFR has been calculated                          using the CKD EPI equation.                          This calculation has not been                          validated in all clinical                          situations.                          eGFR's persistently                          <  90 mL/min signify                          possible Chronic Kidney Disease.  Marland Kitchen Squamous Epithelial / LPF  07/23/2011 FEW* RARE Final  . Bacteria, UA  07/23/2011 FEW* RARE Final  . Casts  07/23/2011 HYALINE CASTS* NEGATIVE Final   GRANULAR CAST  . Urine-Other  07/23/2011 MUCOUS PRESENT   Final    Basename 08/04/11 0440 08/03/11 0527 08/02/11 0426  HGB 11.1* 11.4* 11.7*    Basename 08/04/11 0440 08/03/11 0527  WBC 9.4 9.2  RBC 3.96* 3.97*  HCT 34.1* 34.4*  PLT 146* 146*    Basename 08/04/11 0440 08/03/11 0527  NA 131* 133*  K 4.3 4.2  CL 96 97  CO2 28 27  BUN 22 26*  CREATININE 1.48* 1.66*  GLUCOSE 145* 152*  CALCIUM 9.0 8.7   No results found for this basename: LABPT:2,INR:2 in the last 72 hours  X-Rays:X-ray Knee Right Ap And Lateral  08/01/2011  *RADIOLOGY REPORT*  Clinical Data: Postop right knee arthroplasty.  RIGHT KNEE -  1-2 VIEW  Comparison: 08/01/2011.  Findings: There has been interval right total knee arthroplasty. Subcutaneous and joint air and fluid are present.  Surgical drain is in place.  IMPRESSION: Right total knee arthroplasty with expected immediate postoperative findings.  Original Report Authenticated By: Luretha Rued, M.D.   Dg Knee 2 Views Right  08/01/2011  *RADIOLOGY REPORT*  Clinical Data: Preop right total knee replacement  RIGHT KNEE - 1-2 VIEW  Comparison: None.  Findings: Moderate to severe tricompartmental degenerative changes. Chondrocalcinosis in the lateral compartment.  No fracture or dislocation is seen.  Vascular clips in the medial soft tissues.  No suprapatellar knee joint effusion.  IMPRESSION: Moderate to severe tricompartmental degenerative changes with chondrocalcinosis.  Original Report Authenticated By: Julian Hy, M.D.    EKG: Orders placed in visit on 05/29/11  . EKG 12-LEAD     Hospital Course: Patient was admitted to [facility] and taken to the OR and underwent the above state procedure without complications.  Patient tolerated the procedure well and was later transferred to the recovery room and then to the orthopaedic floor for postoperative care.  They were given PO and IV analgesics for pain control following their surgery.  They were given 24 hours of postoperative antibiotics and started on DVT prophylaxis.   PT and OT were ordered for total joint protocol.  Discharge planning consulted to help with postop disposition and equipment needs.  Patient had a decent night on the evening of surgery and started to get up with therapy on day one.  PCA was discontinued and they were weaned over to PO meds.  Hemovac drain was pulled without difficulty.  Continued to progress with therapy into day two.  Dressing was changed on day two and the incision was a little red with reactive erythema from the CPM.  By day three, the patient had progressed with therapy and  meeting goals.  Incision was healing well.  Patient was seen in rounds and was ready to go home.    Discharge Medications: Prior to Admission medications   Medication Sig Start Date End Date Taking? Authorizing Provider  amLODipine (NORVASC) 10 MG tablet Take 10 mg by mouth every morning.  05/27/11  Yes Darlin Coco, MD  amoxicillin (AMOXIL) 500 MG capsule Take 1 capsule (500 mg total) by mouth 4 (four) times daily. 07/26/11 08/05/11 Yes Darlin Coco, MD  aspirin 81 MG tablet Take 81 mg by mouth every morning.    Yes Historical Provider, MD  atorvastatin (LIPITOR) 10 MG tablet Take 10 mg by mouth every morning.    Yes Historical Provider, MD  budesonide (PULMICORT) 1 MG/2ML nebulizer solution Place 1 mg into the nose daily.    Yes Historical Provider, MD  diphenhydrAMINE (BENADRYL) 25 mg capsule Take 25 mg by mouth every 6 (six) hours as needed. PAIN    Yes Historical Provider, MD  latanoprost (XALATAN) 0.005 % ophthalmic solution Place 1 drop into both eyes at bedtime.    Yes Historical Provider, MD  metFORMIN (GLUCOPHAGE) 500 MG tablet Take 500 mg by mouth daily.    Yes Historical Provider, MD  metoprolol (TOPROL-XL) 50 MG 24 hr tablet Take 25 mg by mouth every morning.    Yes Historical Provider, MD  niacin (NIASPAN) 1000 MG CR tablet 2 mg daily.  01/23/11  Yes Darlin Coco, MD  spironolactone (ALDACTONE) 25 MG tablet Take 25 mg by mouth daily.  12/24/10  Yes Darlin Coco, MD  triamcinolone (NASACORT) 55 MCG/ACT nasal inhaler Place 2 sprays into the nose daily as needed. ALLERGIES    Yes Historical Provider, MD  valsartan-hydrochlorothiazide (DIOVAN-HCT) 320-12.5 MG per tablet Take 1 tablet by mouth every morning.  06/14/11 06/13/12 Yes Darlin Coco, MD  zolpidem (AMBIEN) 5 MG tablet Take 5-10 mg by mouth at bedtime as needed. sleep 06/11/11  Yes Darlin Coco, MD  methocarbamol (ROBAXIN) 500 MG tablet Take 1 tablet (500 mg total) by mouth every 6 (six) hours as needed.  08/04/11 08/14/11  Alexzandrew Perkins, PA  oxyCODONE-acetaminophen (PERCOCET) 5-325 MG per tablet Take 1-2 tablets by mouth every 4 (four) hours as needed for pain. 08/04/11 08/14/11  Alexzandrew Dara Lords, PA  rivaroxaban (XARELTO) 10 MG TABS tablet Take 1 tablet (10 mg total) by mouth daily with breakfast. 08/04/11   Alexzandrew Dara Lords, PA    Diet: carb modified - medium  Activity:WBAT   Follow-up:in 2 weeks  Disposition: Home   Discharged Condition: good   Discharge Orders    Future Appointments: Provider: Department: Dept Phone: Center:   09/23/2011 9:00 AM Darlin Coco, MD [facility] [phone] None   09/23/2011 9:15 AM [place] [phone] [id number]     Future Orders Please Complete By Expires   Diet - low sodium heart healthy      Call MD / Call 911      Comments:   If you experience chest pain or shortness of breath, CALL 911 and be transported to the hospital emergency room.  If you develope a fever above 101 F, pus (white drainage) or increased drainage or redness at the wound, or calf pain, call your surgeon's office.   Constipation Prevention      Comments:   Drink plenty of fluids.  Prune juice may be helpful.  You may use a stool softener, such as Colace (over the counter) 100 mg twice a day.  Use MiraLax (over the counter) for constipation as needed.   Increase activity slowly as tolerated      Weight Bearing as taught in Physical Therapy      Comments:   Use a walker or crutches as instructed.   Discharge instructions      Comments:   Pick up stool softner and laxative for home. Do not submerge incision under water. May shower. Continue to use ice for pain and swelling from surgery.    Driving restrictions  Comments:   No driving   Lifting restrictions      Comments:   No lifting   TED hose      Comments:   Use stockings (TED hose) for 2 weeks on both leg(s).  You may remove them at night for sleeping.    Change dressing      Comments:   Change dressing daily with sterile 4 x 4 inch gauze dressing and apply TED hose.   Do not put a pillow under the knee. Place it under the heel.        Current Discharge Medication List    START taking these medications   Details  methocarbamol (ROBAXIN) 500 MG tablet Take 1 tablet (500 mg total) by mouth every 6 (six) hours as needed. Qty: 80 tablet, Refills: 1    oxyCODONE-acetaminophen (PERCOCET) 5-325 MG per tablet Take 1-2 tablets by mouth every 4 (four) hours as needed for pain. Qty: 80 tablet, Refills: 0    rivaroxaban (XARELTO) 10 MG TABS tablet Take 1 tablet (10 mg total) by mouth daily with breakfast. Qty: 18 tablet, Refills: 0      CONTINUE these medications which have NOT CHANGED   Details  amLODipine (NORVASC) 10 MG tablet Take 10 mg by mouth every morning.     amoxicillin (AMOXIL) 500 MG capsule Take 1 capsule (500 mg total) by mouth 4 (four) times daily. Qty: 30 capsule, Refills: 0   Associated Diagnoses: Sinus infection    aspirin 81 MG tablet Take 81 mg by mouth every morning.     atorvastatin (LIPITOR) 10 MG tablet Take 10 mg by mouth every morning.     budesonide (PULMICORT) 1 MG/2ML nebulizer solution Place 1 mg into the nose daily.     diphenhydrAMINE (BENADRYL) 25 mg capsule Take 25 mg by mouth every 6 (six) hours as needed. PAIN     latanoprost (XALATAN) 0.005 % ophthalmic solution Place 1 drop into both eyes at bedtime.     metFORMIN (GLUCOPHAGE) 500 MG tablet Take 500 mg by mouth daily.     metoprolol (TOPROL-XL) 50 MG 24 hr tablet Take 25 mg by mouth every morning.     niacin (NIASPAN) 1000 MG CR tablet 2 mg daily.     spironolactone (ALDACTONE) 25 MG tablet Take 25 mg by mouth daily.     triamcinolone (NASACORT) 55 MCG/ACT nasal inhaler Place 2 sprays into the nose daily as needed. ALLERGIES     valsartan-hydrochlorothiazide (DIOVAN-HCT) 320-12.5 MG per tablet Take 1 tablet by mouth every morning.       zolpidem (AMBIEN) 5 MG tablet Take 5-10 mg by mouth at bedtime as needed. sleep      STOP taking these medications     b complex vitamins tablet      Cholecalciferol (VITAMIN D3) 1000 UNITS CAPS      Multiple Vitamin (MULTIVITAMIN) tablet      naproxen sodium (ANAPROX) 220 MG tablet      tadalafil (CIALIS) 20 MG tablet      acetaminophen (TYLENOL) 500 MG tablet        Follow-up Information    Follow up with BEANE,JEFFREY C. Make an appointment in 2 weeks.   Contact information:   [facility] [address] [place] [id number]          Signed: Mickel Crow 08/04/2011, 8:10 AM

## 2011-08-04 NOTE — Progress Notes (Signed)
Subjective: 3 Days Post-Op Procedure(s) (LRB): TOTAL KNEE ARTHROPLASTY (Right) Patient reports pain as mild and moderate.   Patient seen in rounds with Dr. Tonita Cong. Patient has complaints of soreness with the knee.  Some redness around the knee.  Objective: Vital signs in last 24 hours: Temp:  [97.4 F (36.3 C)-98.7 F (37.1 C)] 97.4 F (36.3 C) (12/30 0500) Pulse Rate:  [93-105] 98  (12/30 0500) Resp:  [16-22] 16  (12/30 0500) BP: (151-169)/(83-91) 151/83 mmHg (12/30 0500) SpO2:  [90 %-99 %] 94 % (12/30 0500)  Intake/Output from previous day:  Intake/Output Summary (Last 24 hours) at 08/04/11 0755 Last data filed at 08/04/11 0659  Gross per 24 hour  Intake 2232.63 ml  Output   1700 ml  Net 532.63 ml    Intake/Output this shift:    Labs:  Basename 08/04/11 0440 08/03/11 0527 08/02/11 0426  HGB 11.1* 11.4* 11.7*    Basename 08/04/11 0440 08/03/11 0527  WBC 9.4 9.2  RBC 3.96* 3.97*  HCT 34.1* 34.4*  PLT 146* 146*    Basename 08/04/11 0440 08/03/11 0527  NA 131* 133*  K 4.3 4.2  CL 96 97  CO2 28 27  BUN 22 26*  CREATININE 1.48* 1.66*  GLUCOSE 145* 152*  CALCIUM 9.0 8.7   No results found for this basename: LABPT:2,INR:2 in the last 72 hours  Exam - Neurovascular intact Sensation intact distally Dressing/Incision - clean, dry, erythema Motor function intact - moving foot and toes well on exam.   Assessment/Plan: 3 Days Post-Op Procedure(s) (LRB): TOTAL KNEE ARTHROPLASTY (Right)  Past Medical History  Diagnosis Date  . Coronary artery disease     remote CABG in 1985, cath in 2003 by Dr. Lia Foyer with no PCI, last nuclear in 2010 showing scar and EF of 28%.   . Dyslipidemia   . Hyperkalemia   . Hypercholesterolemia   . Left ventricular dysfunction     28% per nuclear in 2010 and 35 to 40% per echo in 2010  . HTN (hypertension)   . Diabetes mellitus   . Cellulitis of arm, left     MSSA  . CKD (chronic kidney disease), stage III   . Spinal stenosis     . BPH (benign prostatic hyperplasia)   . Allergic rhinitis   . Abnormal nuclear cardiac imaging test Nov 2010    moderate area of infarct in the inferior wall with only minimal reversibility and EF of 28%  . ED (erectile dysfunction)   . Myocardial infarction     1985  . Pneumonia     hx of several times years ago   . Blood transfusion     ? at time of bypass surgery   . Arthritis     osteoarthritis   . Neuromuscular disorder     Up with therapy Discharge home with home health  DVT Prophylaxis - Xarelto Protocol Weight-Bearing as tolerated to right leg Wants to go home. F/U in 2 weeks.  Mandy Fitzwater 08/04/2011, 7:55 AM

## 2011-08-04 NOTE — Progress Notes (Signed)
Physical Therapy Treatment Patient Details Name: Shane [name] MRN: [id number] DOB: July 13, 1934 Today's Date: 08/04/2011  PT Assessment/Plan  PT - Assessment/Plan Comments on Treatment Session: Patient progressing toward goals.  Discharging today and has met all goals.  Has equipment and will need HHPT f/u.   PT Plan: Discharge plan remains appropriate;Frequency remains appropriate PT Frequency: 7X/week Follow Up Recommendations: Home health PT Equipment Recommended: None recommended by PT PT Goals  Acute Rehab PT Goals PT Goal Formulation: With patient PT Goal: Supine/Side to Sit - Progress: Met PT Goal: Sit to Supine/Side - Progress: Met PT Goal: Sit to Stand - Progress: Met PT Goal: Stand to Sit - Progress: Met PT Goal: Ambulate - Progress: Met PT Goal: Up/Down Stairs - Progress: Met PT Goal: Perform Home Exercise Program - Progress: Met  PT Treatment Precautions/Restrictions  Precautions Precautions: Knee Precaution Comments: no pillow under knee Required Braces or Orthoses: Yes Knee Immobilizer: On when out of bed or walking Restrictions Weight Bearing Restrictions: No RLE Weight Bearing: Weight bearing as tolerated Mobility (including Balance) Bed Mobility Sit to Supine - Right: Not Tested (comment) Transfers Sit to Stand: 6: Modified independent (Device/Increase time);From chair/3-in-1;From elevated surface;With upper extremity assist;With armrests Sit to Stand Details (indicate cue type and reason): verbal cues for hand placement Stand to Sit: 6: Modified independent (Device/Increase time);With upper extremity assist;With armrests;To chair/3-in-1 Stand to Sit Details: Good technique overall.   Ambulation/Gait Ambulation/Gait Assistance: 5: Supervision Ambulation/Gait Assistance Details (indicate cue type and reason): Sequencing good without the need for cues.   Ambulation Distance (Feet): 200 Feet Assistive device: Rolling walker Gait Pattern: Step-to  pattern;Antalgic Stairs: Yes Stairs Assistance: 4: Min assist Stairs Assistance Details (indicate cue type and reason): Needed min assist to place RW on step whether forwards or backwards.  Once pt. practiced x2, he performed with greater ease. Also needed cues to technique.   Stair Management Technique: Backwards;Forwards;With walker Number of Stairs: 1  Height of Stairs: 4  Wheelchair Mobility Wheelchair Mobility: No  Posture/Postural Control Posture/Postural Control: No significant limitations Balance Balance Assessed: No Exercise  Total Joint Exercises Ankle Circles/Pumps: AROM;Both;10 reps;Seated Quad Sets: AROM;Both;10 reps;Seated Heel Slides: AROM;Right;10 reps;Supine Straight Leg Raises: AAROM;Right;10 reps;Supine Long Arc Quad: AROM;Right;10 reps;Seated Knee Flexion: AROM;Right;10 reps;Seated (3-95 degrees Knee ROM) End of Session PT - End of Session Equipment Utilized During Treatment: Gait belt;Right knee immobilizer (ice to right knee) Activity Tolerance: Patient limited by pain;Patient limited by fatigue Patient left: in chair;with call bell in reach;with family/visitor present Nurse Communication: Mobility status for ambulation General Behavior During Session: [facility] for tasks performed Cognition: [facility] for tasks performed  INGOLD,Saman Giddens 08/04/2011, 1:30 PMDawn Ingold,PT Acute Rehabilitation [phone] (pager)

## 2011-08-04 NOTE — Progress Notes (Signed)
Occupational Therapy Treatment Patient Details Name: Shane [name] MRN: [id number] DOB: 05/10/1934 Today's Date: 08/04/2011 948 1006 1 Chance OT Assessment/Plan OT Assessment/Plan OT Frequency: Min 2X/week Follow Up Recommendations: None Equipment Recommended: Other (comment) (pt has 3:1 commode ) OT Goals ADL Goals Pt Will Transfer to Toilet: with supervision;3-in-1;Ambulation ADL Goal: Toilet Transfer - Progress: Progressing toward goals Pt Will Perform Tub/Shower Transfer: with min assist;Shower transfer ADL Goal: Tub/Shower Transfer - Progress: Met  OT Treatment Precautions/Restrictions  Precautions Precautions: Knee Required Braces or Orthoses: Yes Knee Immobilizer: On when out of bed or walking Restrictions RLE Weight Bearing: Weight bearing as tolerated   ADL ADL Toilet Transfer: Performed;Minimal assistance;Other (comment) (min guard) Toilet Transfer Method: Counselling psychologist: Raised toilet seat with arms (or 3-in-1 over toilet) Tub/Shower Transfer: Performed;Minimal assistance (min guard) Tub/Shower Transfer Details (indicate cue type and reason): min cues for technique Tub/Shower Transfer Method: Therapist, art: Walk in shower Equipment Used: Cabin crew  Transfers Sit to Stand: 5: Supervision;From chair/3-in-1;With upper extremity assist Stand to Sit: 5: Supervision Stand to Sit Details: min cues to extend leg--right Exercises    End of Session OT - End of Session Activity Tolerance: Patient tolerated treatment well Patient left: in chair;with call bell in reach General Behavior During Session: [facility] for tasks performed Cognition: [facility] for tasks performed  Shane [name] [phone] 08/04/2011, 11:20 AM

## 2011-08-05 DIAGNOSIS — Z7901 Long term (current) use of anticoagulants: Secondary | ICD-10-CM | POA: Diagnosis not present

## 2011-08-05 DIAGNOSIS — Z96659 Presence of unspecified artificial knee joint: Secondary | ICD-10-CM | POA: Diagnosis not present

## 2011-08-05 DIAGNOSIS — IMO0001 Reserved for inherently not codable concepts without codable children: Secondary | ICD-10-CM | POA: Diagnosis not present

## 2011-08-05 DIAGNOSIS — Z471 Aftercare following joint replacement surgery: Secondary | ICD-10-CM | POA: Diagnosis not present

## 2011-08-05 DIAGNOSIS — E119 Type 2 diabetes mellitus without complications: Secondary | ICD-10-CM | POA: Diagnosis not present

## 2011-08-07 DIAGNOSIS — Z96659 Presence of unspecified artificial knee joint: Secondary | ICD-10-CM | POA: Diagnosis not present

## 2011-08-07 DIAGNOSIS — IMO0001 Reserved for inherently not codable concepts without codable children: Secondary | ICD-10-CM | POA: Diagnosis not present

## 2011-08-07 DIAGNOSIS — Z7901 Long term (current) use of anticoagulants: Secondary | ICD-10-CM | POA: Diagnosis not present

## 2011-08-07 DIAGNOSIS — E119 Type 2 diabetes mellitus without complications: Secondary | ICD-10-CM | POA: Diagnosis not present

## 2011-08-07 DIAGNOSIS — Z471 Aftercare following joint replacement surgery: Secondary | ICD-10-CM | POA: Diagnosis not present

## 2011-08-09 DIAGNOSIS — Z96659 Presence of unspecified artificial knee joint: Secondary | ICD-10-CM | POA: Diagnosis not present

## 2011-08-09 DIAGNOSIS — Z7901 Long term (current) use of anticoagulants: Secondary | ICD-10-CM | POA: Diagnosis not present

## 2011-08-09 DIAGNOSIS — Z471 Aftercare following joint replacement surgery: Secondary | ICD-10-CM | POA: Diagnosis not present

## 2011-08-09 DIAGNOSIS — IMO0001 Reserved for inherently not codable concepts without codable children: Secondary | ICD-10-CM | POA: Diagnosis not present

## 2011-08-09 DIAGNOSIS — E119 Type 2 diabetes mellitus without complications: Secondary | ICD-10-CM | POA: Diagnosis not present

## 2011-08-12 DIAGNOSIS — Z471 Aftercare following joint replacement surgery: Secondary | ICD-10-CM | POA: Diagnosis not present

## 2011-08-12 DIAGNOSIS — Z96659 Presence of unspecified artificial knee joint: Secondary | ICD-10-CM | POA: Diagnosis not present

## 2011-08-12 DIAGNOSIS — IMO0001 Reserved for inherently not codable concepts without codable children: Secondary | ICD-10-CM | POA: Diagnosis not present

## 2011-08-12 DIAGNOSIS — Z7901 Long term (current) use of anticoagulants: Secondary | ICD-10-CM | POA: Diagnosis not present

## 2011-08-12 DIAGNOSIS — E119 Type 2 diabetes mellitus without complications: Secondary | ICD-10-CM | POA: Diagnosis not present

## 2011-08-14 DIAGNOSIS — Z96659 Presence of unspecified artificial knee joint: Secondary | ICD-10-CM | POA: Diagnosis not present

## 2011-08-14 DIAGNOSIS — Z7901 Long term (current) use of anticoagulants: Secondary | ICD-10-CM | POA: Diagnosis not present

## 2011-08-14 DIAGNOSIS — Z471 Aftercare following joint replacement surgery: Secondary | ICD-10-CM | POA: Diagnosis not present

## 2011-08-14 DIAGNOSIS — IMO0001 Reserved for inherently not codable concepts without codable children: Secondary | ICD-10-CM | POA: Diagnosis not present

## 2011-08-14 DIAGNOSIS — E119 Type 2 diabetes mellitus without complications: Secondary | ICD-10-CM | POA: Diagnosis not present

## 2011-08-15 DIAGNOSIS — M1710 Unilateral primary osteoarthritis, unspecified knee: Secondary | ICD-10-CM | POA: Diagnosis not present

## 2011-08-16 DIAGNOSIS — IMO0001 Reserved for inherently not codable concepts without codable children: Secondary | ICD-10-CM | POA: Diagnosis not present

## 2011-08-16 DIAGNOSIS — Z7901 Long term (current) use of anticoagulants: Secondary | ICD-10-CM | POA: Diagnosis not present

## 2011-08-16 DIAGNOSIS — E119 Type 2 diabetes mellitus without complications: Secondary | ICD-10-CM | POA: Diagnosis not present

## 2011-08-16 DIAGNOSIS — Z471 Aftercare following joint replacement surgery: Secondary | ICD-10-CM | POA: Diagnosis not present

## 2011-08-16 DIAGNOSIS — Z96659 Presence of unspecified artificial knee joint: Secondary | ICD-10-CM | POA: Diagnosis not present

## 2011-08-19 ENCOUNTER — Telehealth: Payer: Self-pay | Admitting: Cardiology

## 2011-08-19 NOTE — Telephone Encounter (Signed)
Pt having nausea and constipation after knee surgery, requesting rx uses [facility]

## 2011-08-19 NOTE — Telephone Encounter (Signed)
Patient has tried Miralax, Colace, and suppositories and nothing helping.  Has call into orthopedic to discuss other pain medications.  This is what is causing the constipation.  Will just discuss with them tomorrow

## 2011-08-21 DIAGNOSIS — M1710 Unilateral primary osteoarthritis, unspecified knee: Secondary | ICD-10-CM | POA: Diagnosis not present

## 2011-08-23 DIAGNOSIS — M1710 Unilateral primary osteoarthritis, unspecified knee: Secondary | ICD-10-CM | POA: Diagnosis not present

## 2011-08-27 DIAGNOSIS — M1710 Unilateral primary osteoarthritis, unspecified knee: Secondary | ICD-10-CM | POA: Diagnosis not present

## 2011-08-29 DIAGNOSIS — M1710 Unilateral primary osteoarthritis, unspecified knee: Secondary | ICD-10-CM | POA: Diagnosis not present

## 2011-09-02 DIAGNOSIS — M1710 Unilateral primary osteoarthritis, unspecified knee: Secondary | ICD-10-CM | POA: Diagnosis not present

## 2011-09-04 DIAGNOSIS — M1710 Unilateral primary osteoarthritis, unspecified knee: Secondary | ICD-10-CM | POA: Diagnosis not present

## 2011-09-10 DIAGNOSIS — M1710 Unilateral primary osteoarthritis, unspecified knee: Secondary | ICD-10-CM | POA: Diagnosis not present

## 2011-09-12 DIAGNOSIS — M1710 Unilateral primary osteoarthritis, unspecified knee: Secondary | ICD-10-CM | POA: Diagnosis not present

## 2011-09-18 DIAGNOSIS — M1710 Unilateral primary osteoarthritis, unspecified knee: Secondary | ICD-10-CM | POA: Diagnosis not present

## 2011-09-19 DIAGNOSIS — M1710 Unilateral primary osteoarthritis, unspecified knee: Secondary | ICD-10-CM | POA: Diagnosis not present

## 2011-09-20 ENCOUNTER — Other Ambulatory Visit: Payer: Self-pay | Admitting: Cardiology

## 2011-09-23 ENCOUNTER — Encounter: Payer: Self-pay | Admitting: Cardiology

## 2011-09-23 ENCOUNTER — Other Ambulatory Visit: Payer: Medicare Other | Admitting: *Deleted

## 2011-09-23 ENCOUNTER — Ambulatory Visit (INDEPENDENT_AMBULATORY_CARE_PROVIDER_SITE_OTHER): Payer: Medicare Other | Admitting: Cardiology

## 2011-09-23 VITALS — BP 138/80 | HR 80 | Ht 71.0 in | Wt 200.0 lb

## 2011-09-23 DIAGNOSIS — I2510 Atherosclerotic heart disease of native coronary artery without angina pectoris: Secondary | ICD-10-CM | POA: Diagnosis not present

## 2011-09-23 DIAGNOSIS — I119 Hypertensive heart disease without heart failure: Secondary | ICD-10-CM

## 2011-09-23 DIAGNOSIS — I259 Chronic ischemic heart disease, unspecified: Secondary | ICD-10-CM

## 2011-09-23 DIAGNOSIS — M1990 Unspecified osteoarthritis, unspecified site: Secondary | ICD-10-CM

## 2011-09-23 DIAGNOSIS — E785 Hyperlipidemia, unspecified: Secondary | ICD-10-CM | POA: Diagnosis not present

## 2011-09-23 LAB — LIPID PANEL
Cholesterol: 122 mg/dL (ref 0–200)
LDL Cholesterol: 62 mg/dL (ref 0–99)
Triglycerides: 65 mg/dL (ref 0.0–149.0)
VLDL: 13 mg/dL (ref 0.0–40.0)

## 2011-09-23 LAB — HEPATIC FUNCTION PANEL
ALT: 15 U/L (ref 0–53)
AST: 18 U/L (ref 0–37)
Albumin: 3.9 g/dL (ref 3.5–5.2)
Alkaline Phosphatase: 49 U/L (ref 39–117)
Bilirubin, Direct: 0.1 mg/dL (ref 0.0–0.3)
Total Bilirubin: 1 mg/dL (ref 0.3–1.2)
Total Protein: 6.7 g/dL (ref 6.0–8.3)

## 2011-09-23 LAB — BASIC METABOLIC PANEL
BUN: 37 mg/dL — ABNORMAL HIGH (ref 6–23)
Chloride: 109 mEq/L (ref 96–112)
Creatinine, Ser: 1.5 mg/dL (ref 0.4–1.5)
GFR: 47.61 mL/min — ABNORMAL LOW (ref >60.00)
Glucose, Bld: 118 mg/dL — ABNORMAL HIGH (ref 70–99)
Potassium: 4.1 mEq/L (ref 3.5–5.1)

## 2011-09-23 NOTE — Assessment & Plan Note (Signed)
The patient has not been having any symptoms from his essential hypertension.  Blood pressures have been remaining stable.  He has been under more stress happened to look after his wife who had a stroke and is still in the rehabilitation center at the hospital but will be coming home soon.

## 2011-09-23 NOTE — Progress Notes (Signed)
Quick Note:  Please report to patient. The recent labs are stable. Continue same medication and careful diet. The HDL is up to 47 which is very good. The blood sugar is better. ______

## 2011-09-23 NOTE — Telephone Encounter (Signed)
Refilled metformin

## 2011-09-23 NOTE — Assessment & Plan Note (Signed)
The patient has a history of dyslipidemia and is on low-dose Lipitor.  He has had a chronically low HDL level.  For this reason he is also on Niaspan.  Work today is pending

## 2011-09-23 NOTE — Assessment & Plan Note (Signed)
The patient has made a remarkably good recovery from his right total knee replacement.

## 2011-09-23 NOTE — Progress Notes (Signed)
Shane [name] Date of Birth:  12/07/1933 [facility] [address] [place], [place]  [address] [phone]         Fax   [phone]  History of Present Illness: This pleasant 78 year old gentleman is seen for a three-month followup office visit.  He has a history of ischemic heart disease with ischemic cardiomyopathy.  His last nuclear stress test 06/05/11 showed no ischemia but showed that his left ventricular ejection fraction had increased from 20% to 24%.  He has not been experiencing any angina pectoris.  He is not having any symptoms of congestive heart failure.  He's had no palpitations dizziness or syncope.  He recently underwent successful right total knee replacement on 08/01/11 and has been told by his therapist that he is way ahead of the normal schedule.  Current Outpatient Prescriptions  Medication Sig Dispense Refill  . amLODipine (NORVASC) 10 MG tablet Take 10 mg by mouth every morning.       Marland Kitchen aspirin 81 MG tablet Take 81 mg by mouth every morning.       Marland Kitchen atorvastatin (LIPITOR) 10 MG tablet Take 10 mg by mouth every morning.       . budesonide (PULMICORT) 1 MG/2ML nebulizer solution Place 1 mg into the nose daily.       . diphenhydrAMINE (BENADRYL) 25 mg capsule Take 25 mg by mouth every 6 (six) hours as needed. PAIN       . latanoprost (XALATAN) 0.005 % ophthalmic solution Place 1 drop into both eyes at bedtime.       . metoprolol (TOPROL-XL) 50 MG 24 hr tablet Take 25 mg by mouth every morning.       . niacin (NIASPAN) 1000 MG CR tablet daily. Taking 2 daily      . spironolactone (ALDACTONE) 25 MG tablet Take 25 mg by mouth daily.       Marland Kitchen triamcinolone (NASACORT) 55 MCG/ACT nasal inhaler Place 2 sprays into the nose daily as needed. ALLERGIES       . valsartan-hydrochlorothiazide (DIOVAN-HCT) 320-12.5 MG per tablet Take 1 tablet by mouth every morning.       . zolpidem (AMBIEN) 5 MG tablet Take 5-10 mg by mouth at bedtime as needed. sleep        . CIALIS 20 MG tablet as directed.      . metFORMIN (GLUCOPHAGE) 500 MG tablet TAKE ONE TABLET BY MOUTH EVERY DAY  90 tablet  3  . mupirocin ointment (BACTROBAN) 2 % as directed.      Marland Kitchen DISCONTD: valsartan-hydrochlorothiazide (DIOVAN HCT) 320-12.5 MG per tablet Take 1 tablet by mouth daily.  30 tablet  5    Allergies  Allergen Reactions  . Codeine     anxiety  . Hydrocodone     anxiety  . Azithromycin Rash    Patient Active Problem List  Diagnoses  . Ischemic heart disease  . Dyslipidemia  . Hyperkalemia  . Hypercholesterolemia  . Diabetes mellitus  . Benign hypertensive heart disease without heart failure  . Allergic rhinitis  . Osteoarthritis  . Cellulitis of left elbow and left elbow septic bursitis  . CAD s/p coronary arthery bypass graft  . BPH (benign prostatic hyperplasia)  . Kidney disease, chronic, stage III (GFR 30-59 ml/min)  . H/O Spinal stenosis    History  Smoking status  . Former Smoker -- 5 years  . Types: Cigarettes  . Quit date: 08/05/1958  Smokeless tobacco  . Never Used  History  Alcohol Use No    Family History  Problem Relation Age of Onset  . Heart attack Father   . Heart disease Father     Review of Systems: Constitutional: no fever chills diaphoresis or fatigue or change in weight.  Head and neck: no hearing loss, no epistaxis, no photophobia or visual disturbance. Respiratory: No cough, shortness of breath or wheezing. Cardiovascular: No chest pain peripheral edema, palpitations. Gastrointestinal: No abdominal distention, no abdominal pain, no change in bowel habits hematochezia or melena. Genitourinary: No dysuria, no frequency, no urgency, no nocturia. Musculoskeletal:No arthralgias, no back pain, no gait disturbance or myalgias. Neurological: No dizziness, no headaches, no numbness, no seizures, no syncope, no weakness, no tremors. Hematologic: No lymphadenopathy, no easy bruising. Psychiatric: No confusion, no  hallucinations, no sleep disturbance.    Physical Exam: Filed Vitals:   09/23/11 0919  BP: 138/80  Pulse: 80   the general appearance is that of a well-developed well-nourished gentleman in no distress.  He has lost 10 pounds since last visit.  He is watching his diet carefully.Pupils equal and reactive.   Extraocular Movements are full.  There is no scleral icterus.  The mouth and pharynx are normal.  The neck is supple.  The carotids reveal no bruits.  The jugular venous pressure is normal.  The thyroid is not enlarged.  There is no lymphadenopathy.  The chest is clear to percussion and auscultation. There are no rales or rhonchi. Expansion of the chest is symmetrical.  The precordium is quiet.  The first heart sound is normal.  The second heart sound is physiologically split.  There is no murmur gallop rub or click.  There is no abnormal lift or heave.  The abdomen is soft and nontender. Bowel sounds are normal. The liver and spleen are not enlarged. There Are no abdominal masses. There are no bruits.  Extremities reveal good range of motion of his right knee.  He still wears a brace on his left foot because of previous footdrop problem.  No phlebitis.  No edema   Assessment / Plan: Continue same medication.  Recheck in 3 months for followup office visit EKG and fasting lab work

## 2011-09-23 NOTE — Patient Instructions (Addendum)
Will obtain labs today and call you with the results (lp/bmet/hfp) Your physician recommends that you continue on your current medications as directed. Please refer to the Current Medication list given to you today. Your physician recommends that you schedule a follow-up appointment in: 3 months with fasting labs (LP/BMET/HFP) ov and EKG

## 2011-09-24 ENCOUNTER — Telehealth: Payer: Self-pay | Admitting: *Deleted

## 2011-09-24 DIAGNOSIS — M1710 Unilateral primary osteoarthritis, unspecified knee: Secondary | ICD-10-CM | POA: Diagnosis not present

## 2011-09-24 NOTE — Telephone Encounter (Signed)
Message copied by Earvin Hansen on Tue Sep 24, 2011  2:50 PM ------      Message from: Darlin Coco      Created: Mon Sep 23, 2011  7:28 PM       Please report to patient.  The recent labs are stable. Continue same medication and careful diet.  The HDL is up to 47 which is very good.  The blood sugar is better.

## 2011-09-24 NOTE — Telephone Encounter (Signed)
Mailed copy of labs and left message to call if any questions  

## 2011-09-27 DIAGNOSIS — M1710 Unilateral primary osteoarthritis, unspecified knee: Secondary | ICD-10-CM | POA: Diagnosis not present

## 2011-10-14 DIAGNOSIS — E291 Testicular hypofunction: Secondary | ICD-10-CM | POA: Diagnosis not present

## 2011-10-22 DIAGNOSIS — E119 Type 2 diabetes mellitus without complications: Secondary | ICD-10-CM | POA: Diagnosis not present

## 2011-10-22 DIAGNOSIS — C44319 Basal cell carcinoma of skin of other parts of face: Secondary | ICD-10-CM | POA: Diagnosis not present

## 2011-10-22 DIAGNOSIS — L821 Other seborrheic keratosis: Secondary | ICD-10-CM | POA: Diagnosis not present

## 2011-11-12 ENCOUNTER — Other Ambulatory Visit: Payer: Self-pay | Admitting: Cardiology

## 2011-11-14 DIAGNOSIS — E291 Testicular hypofunction: Secondary | ICD-10-CM | POA: Diagnosis not present

## 2011-11-26 DIAGNOSIS — L259 Unspecified contact dermatitis, unspecified cause: Secondary | ICD-10-CM | POA: Diagnosis not present

## 2011-11-26 DIAGNOSIS — Z85828 Personal history of other malignant neoplasm of skin: Secondary | ICD-10-CM | POA: Diagnosis not present

## 2011-11-30 ENCOUNTER — Other Ambulatory Visit: Payer: Self-pay | Admitting: Cardiology

## 2011-12-02 DIAGNOSIS — M19079 Primary osteoarthritis, unspecified ankle and foot: Secondary | ICD-10-CM | POA: Diagnosis not present

## 2011-12-02 DIAGNOSIS — G5730 Lesion of lateral popliteal nerve, unspecified lower limb: Secondary | ICD-10-CM | POA: Diagnosis not present

## 2011-12-10 DIAGNOSIS — M19079 Primary osteoarthritis, unspecified ankle and foot: Secondary | ICD-10-CM | POA: Diagnosis not present

## 2011-12-16 ENCOUNTER — Other Ambulatory Visit (INDEPENDENT_AMBULATORY_CARE_PROVIDER_SITE_OTHER): Payer: Medicare Other

## 2011-12-16 ENCOUNTER — Encounter: Payer: Self-pay | Admitting: Cardiology

## 2011-12-16 ENCOUNTER — Ambulatory Visit (INDEPENDENT_AMBULATORY_CARE_PROVIDER_SITE_OTHER): Payer: Medicare Other | Admitting: Cardiology

## 2011-12-16 VITALS — BP 128/80 | HR 75 | Ht 69.0 in | Wt 205.0 lb

## 2011-12-16 DIAGNOSIS — I259 Chronic ischemic heart disease, unspecified: Secondary | ICD-10-CM | POA: Diagnosis not present

## 2011-12-16 DIAGNOSIS — E7800 Pure hypercholesterolemia, unspecified: Secondary | ICD-10-CM | POA: Diagnosis not present

## 2011-12-16 DIAGNOSIS — I2510 Atherosclerotic heart disease of native coronary artery without angina pectoris: Secondary | ICD-10-CM

## 2011-12-16 DIAGNOSIS — E785 Hyperlipidemia, unspecified: Secondary | ICD-10-CM

## 2011-12-16 DIAGNOSIS — M4800 Spinal stenosis, site unspecified: Secondary | ICD-10-CM

## 2011-12-16 DIAGNOSIS — E119 Type 2 diabetes mellitus without complications: Secondary | ICD-10-CM | POA: Diagnosis not present

## 2011-12-16 DIAGNOSIS — I119 Hypertensive heart disease without heart failure: Secondary | ICD-10-CM

## 2011-12-16 LAB — HEPATIC FUNCTION PANEL
ALT: 18 U/L (ref 0–53)
Albumin: 3.8 g/dL (ref 3.5–5.2)
Alkaline Phosphatase: 52 U/L (ref 39–117)
Bilirubin, Direct: 0.2 mg/dL (ref 0.0–0.3)
Total Protein: 7.1 g/dL (ref 6.0–8.3)

## 2011-12-16 LAB — BASIC METABOLIC PANEL
BUN: 46 mg/dL — ABNORMAL HIGH (ref 6–23)
Calcium: 8.9 mg/dL (ref 8.4–10.5)
Creatinine, Ser: 1.8 mg/dL — ABNORMAL HIGH (ref 0.4–1.5)
GFR: 38.17 mL/min — ABNORMAL LOW (ref >60.00)

## 2011-12-16 LAB — LIPID PANEL
Cholesterol: 118 mg/dL (ref 0–200)
Triglycerides: 58 mg/dL (ref 0.0–149.0)

## 2011-12-16 NOTE — Progress Notes (Signed)
Shane [name] Date of Birth:  09/05/1933 [facility] [address] [place], [place]  [address] [phone]         Fax   [phone]  History of Present Illness: This pleasant 78 year old gentleman is seen for a scheduled followup office visit.  He has a history of ischemic heart disease with ischemic cardiomyopathy.  He had a nuclear stress test in 06/05/11 which showed no ischemia.  Also showed that his left ventricular ejection fraction had increased from 20% to 24%.  The patient has not been experiencing any dizziness or syncope.  He said no palpitations.  He has not had any symptoms of congestive heart failure.  On 08/01/11 he underwent successful right total knee replacement.  He did very well in the physical therapy following knee replacement. Current Outpatient Prescriptions  Medication Sig Dispense Refill  . amLODipine (NORVASC) 10 MG tablet TAKE 1 TABLET DAILY  90 tablet  1  . aspirin 81 MG tablet Take 81 mg by mouth every morning.       Marland Kitchen atorvastatin (LIPITOR) 10 MG tablet TAKE 1 TABLET DAILY  90 tablet  2  . budesonide (PULMICORT) 1 MG/2ML nebulizer solution Place 1 mg into the nose daily.       Marland Kitchen CIALIS 20 MG tablet as directed.      . diphenhydrAMINE (BENADRYL) 25 mg capsule Take 25 mg by mouth every 6 (six) hours as needed. PAIN       . latanoprost (XALATAN) 0.005 % ophthalmic solution Place 1 drop into both eyes at bedtime.       . metFORMIN (GLUCOPHAGE) 500 MG tablet TAKE ONE TABLET BY MOUTH EVERY DAY  90 tablet  3  . metoprolol succinate (TOPROL-XL) 50 MG 24 hr tablet TAKE ONE-HALF (1/2) TABLET DAILY OR AS DIRECTED  90 tablet  2  . mupirocin ointment (BACTROBAN) 2 % as directed.      Marland Kitchen NASACORT AQ 55 MCG/ACT nasal inhaler USE 1 TO 2 SPRAYS NASALLY DAILY AS DIRECTED  16.5 g  2  . niacin (NIASPAN) 1000 MG CR tablet daily. Taking 2 daily      . spironolactone (ALDACTONE) 25 MG tablet Take 25 mg by mouth daily.       . valsartan-hydrochlorothiazide  (DIOVAN-HCT) 320-12.5 MG per tablet Take 1 tablet by mouth every morning.       . zolpidem (AMBIEN) 5 MG tablet Take 5-10 mg by mouth at bedtime as needed. sleep      . DISCONTD: triamcinolone (NASACORT) 55 MCG/ACT nasal inhaler Place 2 sprays into the nose daily as needed. ALLERGIES       . DISCONTD: valsartan-hydrochlorothiazide (DIOVAN HCT) 320-12.5 MG per tablet Take 1 tablet by mouth daily.  30 tablet  5    Allergies  Allergen Reactions  . Codeine     anxiety  . Hydrocodone     anxiety  . Azithromycin Rash    Patient Active Problem List  Diagnoses  . Ischemic heart disease  . Dyslipidemia  . Hyperkalemia  . Hypercholesterolemia  . Diabetes mellitus  . Benign hypertensive heart disease without heart failure  . Allergic rhinitis  . Osteoarthritis  . Cellulitis of left elbow and left elbow septic bursitis  . CAD s/p coronary arthery bypass graft  . BPH (benign prostatic hyperplasia)  . Kidney disease, chronic, stage III (GFR 30-59 ml/min)  . H/O Spinal stenosis    History  Smoking status  . Former Smoker -- 5 years  . Types: Cigarettes  .  Quit date: 08/05/1958  Smokeless tobacco  . Never Used    History  Alcohol Use No    Family History  Problem Relation Age of Onset  . Heart attack Father   . Heart disease Father     Review of Systems: Constitutional: no fever chills diaphoresis or fatigue or change in weight.  Head and neck: no hearing loss, no epistaxis, no photophobia or visual disturbance. Respiratory: No cough, shortness of breath or wheezing. Cardiovascular: No chest pain peripheral edema, palpitations. Gastrointestinal: No abdominal distention, no abdominal pain, no change in bowel habits hematochezia or melena. Genitourinary: No dysuria, no frequency, no urgency, no nocturia. Musculoskeletal:No arthralgias, no back pain, no gait disturbance or myalgias. Neurological: No dizziness, no headaches, no numbness, no seizures, no syncope, no weakness,  no tremors. Hematologic: No lymphadenopathy, no easy bruising. Psychiatric: No confusion, no hallucinations, no sleep disturbance.    Physical Exam: Filed Vitals:   12/16/11 0917  BP: 128/80  Pulse: 75   the general appearance reveals a well-developed well-nourished gentleman in no distress.Pupils equal and reactive.   Extraocular Movements are full.  There is no scleral icterus.  The mouth and pharynx are normal.  The neck is supple.  The carotids reveal no bruits.  The jugular venous pressure is normal.  The thyroid is not enlarged.  There is no lymphadenopathy.  The chest is clear to percussion and auscultation. There are no rales or rhonchi. Expansion of the chest is symmetrical.  The precordium is quiet.  The first heart sound is normal.  The second heart sound is physiologically split.  There is no murmur gallop rub or click.  There is no abnormal lift or heave.  The abdomen is soft and nontender. Bowel sounds are normal. The liver and spleen are not enlarged. There Are no abdominal masses. There are no bruits.  The pedal pulses are good.  There is no phlebitis or edema.  There is no cyanosis or clubbing. Neurological reveals that he has a left  Footdrop and he wears a special support boot to keep his left foot in a neutral plane.   Assessment / Plan:  Continue same medication.  Blood work today pending.  Recheck in 4 months for followup office visit and fasting lab work.  His weight is up 5 pounds since last visit and he will try hard to get that weight off.

## 2011-12-16 NOTE — Patient Instructions (Signed)
Will obtain labs today and call you with the results   Your physician recommends that you continue on your current medications as directed. Please refer to the Current Medication list given to you today.  Your physician recommends that you schedule a follow-up appointment in: 4 months with fasting labs (lp/bmet/hfp)

## 2011-12-16 NOTE — Assessment & Plan Note (Signed)
The patient has not been having any hypoglycemic symptoms

## 2011-12-16 NOTE — Assessment & Plan Note (Signed)
No chest pain or angina.  

## 2011-12-16 NOTE — Progress Notes (Signed)
Quick Note:  Please report to patient. The recent labs are stable. Continue same medication and careful diet. Kidneys drier so drink more water.BS better. ______

## 2011-12-16 NOTE — Assessment & Plan Note (Signed)
Blood work today is pending.  He is on Niaspan and Lipitor and is not having any side effects from the medication

## 2011-12-16 NOTE — Assessment & Plan Note (Signed)
The patient has had previous back surgery for spinal stenosis.  He is not having active back pain at this time but does have a persistent left foot drop and has to wear a special boot on his left foot

## 2011-12-17 ENCOUNTER — Telehealth: Payer: Self-pay | Admitting: *Deleted

## 2011-12-17 NOTE — Telephone Encounter (Signed)
Advised and copy given to patient

## 2011-12-17 NOTE — Telephone Encounter (Signed)
Message copied by Earvin Hansen on Tue Dec 17, 2011 10:18 AM ------      Message from: Darlin Coco      Created: Mon Dec 16, 2011  8:57 PM       Please report to patient.  The recent labs are stable. Continue same medication and careful diet. Kidneys drier so drink more water.BS better.

## 2011-12-18 DIAGNOSIS — N400 Enlarged prostate without lower urinary tract symptoms: Secondary | ICD-10-CM | POA: Diagnosis not present

## 2011-12-18 DIAGNOSIS — E291 Testicular hypofunction: Secondary | ICD-10-CM | POA: Diagnosis not present

## 2011-12-18 DIAGNOSIS — C61 Malignant neoplasm of prostate: Secondary | ICD-10-CM | POA: Diagnosis not present

## 2012-01-08 ENCOUNTER — Other Ambulatory Visit: Payer: Self-pay | Admitting: Cardiology

## 2012-01-13 DIAGNOSIS — E291 Testicular hypofunction: Secondary | ICD-10-CM | POA: Diagnosis not present

## 2012-01-15 DIAGNOSIS — H40019 Open angle with borderline findings, low risk, unspecified eye: Secondary | ICD-10-CM | POA: Diagnosis not present

## 2012-01-15 DIAGNOSIS — H4010X0 Unspecified open-angle glaucoma, stage unspecified: Secondary | ICD-10-CM | POA: Diagnosis not present

## 2012-01-21 ENCOUNTER — Other Ambulatory Visit: Payer: Self-pay | Admitting: Cardiology

## 2012-02-08 ENCOUNTER — Other Ambulatory Visit: Payer: Self-pay | Admitting: Cardiology

## 2012-02-08 DIAGNOSIS — G4700 Insomnia, unspecified: Secondary | ICD-10-CM

## 2012-02-10 DIAGNOSIS — E291 Testicular hypofunction: Secondary | ICD-10-CM | POA: Diagnosis not present

## 2012-02-10 NOTE — Telephone Encounter (Signed)
Refill on [place]

## 2012-02-18 ENCOUNTER — Other Ambulatory Visit: Payer: Self-pay | Admitting: Cardiology

## 2012-03-11 DIAGNOSIS — M19079 Primary osteoarthritis, unspecified ankle and foot: Secondary | ICD-10-CM | POA: Diagnosis not present

## 2012-03-12 DIAGNOSIS — E291 Testicular hypofunction: Secondary | ICD-10-CM | POA: Diagnosis not present

## 2012-03-31 DIAGNOSIS — Z85828 Personal history of other malignant neoplasm of skin: Secondary | ICD-10-CM | POA: Diagnosis not present

## 2012-03-31 DIAGNOSIS — C44319 Basal cell carcinoma of skin of other parts of face: Secondary | ICD-10-CM | POA: Diagnosis not present

## 2012-03-31 DIAGNOSIS — D235 Other benign neoplasm of skin of trunk: Secondary | ICD-10-CM | POA: Diagnosis not present

## 2012-04-04 ENCOUNTER — Emergency Department (INDEPENDENT_AMBULATORY_CARE_PROVIDER_SITE_OTHER)
Admission: EM | Admit: 2012-04-04 | Discharge: 2012-04-04 | Disposition: A | Discharge status: Home / Self Care | Payer: Medicare Other | Source: Home / Self Care | Attending: Family Medicine | Admitting: Family Medicine

## 2012-04-04 ENCOUNTER — Encounter (HOSPITAL_COMMUNITY): Payer: Self-pay

## 2012-04-04 DIAGNOSIS — L03119 Cellulitis of unspecified part of limb: Secondary | ICD-10-CM

## 2012-04-04 DIAGNOSIS — L02419 Cutaneous abscess of limb, unspecified: Secondary | ICD-10-CM

## 2012-04-04 MED ORDER — CEPHALEXIN 500 MG PO CAPS: 500.0000 mg | ORAL_CAPSULE | Freq: Three times a day (TID) | ORAL | Status: AC

## 2012-04-04 NOTE — ED Provider Notes (Signed)
History     CSN: [id number]  Arrival date & time 04/04/12  1903   First MD Initiated Contact with Patient 04/04/12 2036      Chief Complaint  Patient presents with  . Leg Problem    (Consider location/radiation/quality/duration/timing/severity/associated sxs/prior treatment) Patient is a 78 y.o. male presenting with rash. The history is provided by the patient.  Rash  This is a new problem. The current episode started yesterday. The problem has been gradually worsening. The problem is associated with an unknown factor. There has been no fever. The rash is present on the right lower leg. The patient is experiencing no pain. Associated symptoms include itching.    Past Medical History  Diagnosis Date  . Coronary artery disease     remote CABG in 1985, cath in 2003 by Dr. Lia Foyer with no PCI, last nuclear in 2010 showing scar and EF of 28%.   . Dyslipidemia   . Hyperkalemia   . Hypercholesterolemia   . Left ventricular dysfunction     28% per nuclear in 2010 and 35 to 40% per echo in 2010  . HTN (hypertension)   . Diabetes mellitus   . Cellulitis of arm, left     MSSA  . CKD (chronic kidney disease), stage III   . Spinal stenosis   . BPH (benign prostatic hyperplasia)   . Allergic rhinitis   . Abnormal nuclear cardiac imaging test Nov 2010    moderate area of infarct in the inferior wall with only minimal reversibility and EF of 28%  . ED (erectile dysfunction)   . Myocardial infarction     1985  . Pneumonia     hx of several times years ago   . Blood transfusion     ? at time of bypass surgery   . Arthritis     osteoarthritis   . Neuromuscular disorder     Past Surgical History  Procedure Date  . Cardiac catheterization 2003  . Coronary artery bypass graft 1985  . Lumbar disc surgery   . Prostate surgery   . Back surgery     hx of back surgery x 4   . Total knee arthroplasty 08/01/2011    Procedure: TOTAL KNEE ARTHROPLASTY;  Surgeon: Johnn Hai;   Location: [facility];  Service: Orthopedics;  Laterality: Right;    Family History  Problem Relation Age of Onset  . Heart attack Father   . Heart disease Father     History  Substance Use Topics  . Smoking status: Former Smoker -- 5 years    Types: Cigarettes    Quit date: 08/05/1958  . Smokeless tobacco: Never Used  . Alcohol Use: No      Review of Systems  Constitutional: Negative.   Skin: Positive for itching and rash.    Allergies  Codeine; Hydrocodone; and Azithromycin  Home Medications   Current Outpatient Rx  Name Route Sig Dispense Refill  . AMLODIPINE BESYLATE 10 MG PO TABS  TAKE 1 TABLET DAILY 90 tablet 1    Dispense as written.  . ASPIRIN 81 MG PO TABS Oral Take 81 mg by mouth every morning.     . ATORVASTATIN CALCIUM 10 MG PO TABS  TAKE 1 TABLET DAILY 90 tablet 2  . BUDESONIDE 1 MG/2ML IN SUSP Nasal Place 1 mg into the nose daily.     . CEPHALEXIN 500 MG PO CAPS Oral Take 1 capsule (500 mg total) by mouth 3 (three) times daily. Take all of  medicine and drink lots of fluids 21 capsule 0  . CIALIS 20 MG PO TABS  as directed.    Marland Kitchen DIOVAN HCT 320-12.5 MG PO TABS  TAKE 1 TABLET DAILY 90 tablet 1    Dispense as written.  Marland Kitchen DIPHENHYDRAMINE HCL 25 MG PO CAPS Oral Take 25 mg by mouth every 6 (six) hours as needed. PAIN     . LATANOPROST 0.005 % OP SOLN Both Eyes Place 1 drop into both eyes at bedtime.     Marland Kitchen METFORMIN HCL 500 MG PO TABS  TAKE ONE TABLET BY MOUTH EVERY DAY 90 tablet 3  . METOPROLOL SUCCINATE ER 50 MG PO TB24  TAKE ONE-HALF (1/2) TABLET DAILY OR AS DIRECTED 90 tablet 2  . MUPIROCIN 2 % EX OINT  as directed.    Marland Kitchen NASACORT AQ 55 MCG/ACT NA AERS  USE 1 TO 2 SPRAYS NASALLY DAILY AS DIRECTED 16.5 g 3    Dispense as written.  Marland Kitchen NIACIN ER (ANTIHYPERLIPIDEMIC) 1000 MG PO TBCR  daily. Taking 2 daily    . SPIRONOLACTONE 25 MG PO TABS Oral Take 25 mg by mouth daily.     Marland Kitchen SPIRONOLACTONE 25 MG PO TABS  TAKE ONE TABLET BY MOUTH EVERY DAY 30 tablet 3  . ZOLPIDEM  TARTRATE 5 MG PO TABS  TAKE ONE TO TWO TABLETS BY MOUTH AT BEDTIME AS NEEDED FOR SLEEP 60 tablet 3    BP 160/85  Pulse 80  Temp 99.1 F (37.3 C) (Oral)  Resp 17  SpO2 96%  Physical Exam  Nursing note and vitals reviewed. Constitutional: He appears well-developed and well-nourished.  Skin: Skin is warm and dry. Rash noted. There is erythema.       Erythematous pretibial patch with tiny black papule approx 7x3 cm, nontender.    ED Course  Procedures (including critical care time)  Labs Reviewed - No data to display No results found.   1. Cellulitis of lower leg       MDM          Billy Fischer, MD 04/04/12 2050

## 2012-04-04 NOTE — ED Notes (Signed)
Pt has redness on lt shin that extends up his leg.  He wears an aso on his ankle and has for 10 years and states that he normally wears a brace that goes to his knee but it doesn't rub or irritate his leg.  Area feels warm and tender. Pt is diabetic.

## 2012-04-14 ENCOUNTER — Other Ambulatory Visit: Payer: Self-pay | Admitting: Cardiology

## 2012-04-15 NOTE — Telephone Encounter (Signed)
Refilled niaspan

## 2012-04-17 DIAGNOSIS — Z23 Encounter for immunization: Secondary | ICD-10-CM | POA: Diagnosis not present

## 2012-04-22 DIAGNOSIS — N400 Enlarged prostate without lower urinary tract symptoms: Secondary | ICD-10-CM | POA: Diagnosis not present

## 2012-04-22 DIAGNOSIS — E291 Testicular hypofunction: Secondary | ICD-10-CM | POA: Diagnosis not present

## 2012-04-23 DIAGNOSIS — J309 Allergic rhinitis, unspecified: Secondary | ICD-10-CM | POA: Diagnosis not present

## 2012-04-23 DIAGNOSIS — J329 Chronic sinusitis, unspecified: Secondary | ICD-10-CM | POA: Diagnosis not present

## 2012-04-23 DIAGNOSIS — E291 Testicular hypofunction: Secondary | ICD-10-CM | POA: Diagnosis not present

## 2012-04-29 ENCOUNTER — Ambulatory Visit: Payer: Medicare Other | Admitting: Cardiology

## 2012-04-29 ENCOUNTER — Other Ambulatory Visit: Payer: Medicare Other

## 2012-05-14 DIAGNOSIS — E291 Testicular hypofunction: Secondary | ICD-10-CM | POA: Diagnosis not present

## 2012-05-19 DIAGNOSIS — H4011X0 Primary open-angle glaucoma, stage unspecified: Secondary | ICD-10-CM | POA: Diagnosis not present

## 2012-05-19 DIAGNOSIS — H4010X0 Unspecified open-angle glaucoma, stage unspecified: Secondary | ICD-10-CM | POA: Diagnosis not present

## 2012-05-25 ENCOUNTER — Other Ambulatory Visit: Payer: Self-pay | Admitting: Cardiology

## 2012-06-03 ENCOUNTER — Other Ambulatory Visit: Payer: Self-pay | Admitting: Cardiology

## 2012-06-03 DIAGNOSIS — E291 Testicular hypofunction: Secondary | ICD-10-CM | POA: Diagnosis not present

## 2012-06-15 ENCOUNTER — Other Ambulatory Visit: Payer: Self-pay

## 2012-06-17 ENCOUNTER — Encounter: Payer: Self-pay | Admitting: Cardiology

## 2012-06-17 ENCOUNTER — Ambulatory Visit (INDEPENDENT_AMBULATORY_CARE_PROVIDER_SITE_OTHER): Payer: Medicare Other | Admitting: Cardiology

## 2012-06-17 ENCOUNTER — Ambulatory Visit (INDEPENDENT_AMBULATORY_CARE_PROVIDER_SITE_OTHER): Payer: Medicare Other | Admitting: *Deleted

## 2012-06-17 VITALS — BP 164/84 | HR 72 | Ht 61.32 in | Wt 213.0 lb

## 2012-06-17 DIAGNOSIS — I119 Hypertensive heart disease without heart failure: Secondary | ICD-10-CM | POA: Diagnosis not present

## 2012-06-17 DIAGNOSIS — I259 Chronic ischemic heart disease, unspecified: Secondary | ICD-10-CM | POA: Diagnosis not present

## 2012-06-17 DIAGNOSIS — E7800 Pure hypercholesterolemia, unspecified: Secondary | ICD-10-CM | POA: Diagnosis not present

## 2012-06-17 DIAGNOSIS — I2510 Atherosclerotic heart disease of native coronary artery without angina pectoris: Secondary | ICD-10-CM | POA: Diagnosis not present

## 2012-06-17 DIAGNOSIS — E785 Hyperlipidemia, unspecified: Secondary | ICD-10-CM

## 2012-06-17 LAB — BASIC METABOLIC PANEL
CO2: 23 mEq/L (ref 19–32)
Calcium: 9.4 mg/dL (ref 8.4–10.5)
GFR: 36.07 mL/min — ABNORMAL LOW (ref >60.00)
Sodium: 135 mEq/L (ref 135–145)

## 2012-06-17 LAB — HEPATIC FUNCTION PANEL
ALT: 19 U/L (ref 0–53)
AST: 24 U/L (ref 0–37)
Albumin: 4.1 g/dL (ref 3.5–5.2)

## 2012-06-17 LAB — LIPID PANEL
HDL: 30.2 mg/dL — ABNORMAL LOW (ref >39.00)
Total CHOL/HDL Ratio: 3
Triglycerides: 116 mg/dL (ref 0.0–149.0)

## 2012-06-17 MED ORDER — AMLODIPINE BESYLATE 10 MG PO TABS: 10.0000 mg | ORAL_TABLET | Freq: Every day | ORAL | Status: DC

## 2012-06-17 NOTE — Assessment & Plan Note (Signed)
The patient has not been experiencing any recurrent chest pain or angina. 

## 2012-06-17 NOTE — Progress Notes (Signed)
Shane [name] Date of Birth:  1934-03-28 [facility] [address] [place], [place]  [address] [phone]         Fax   [phone]  History of Present Illness: This pleasant 78 year old gentleman is seen for a scheduled followup office visit. He has a history of ischemic heart disease with ischemic cardiomyopathy. He had a nuclear stress test in 06/05/11 which showed no ischemia. Also showed that his left ventricular ejection fraction had increased from 20% to 24%. The patient has not been experiencing any dizziness or syncope. He said no palpitations. He has not had any symptoms of congestive heart failure. On 08/01/11 he underwent successful right total knee replacement. He did very well in the physical therapy following knee replacement.  Since last visit he has had no new cardiac symptoms.  He has gained about 8 pounds.  He has been getting less exercise and has been in the out in restaurants more because his wife cooks less since her stroke.  Current Outpatient Prescriptions  Medication Sig Dispense Refill  . amLODipine (NORVASC) 10 MG tablet Take 1 tablet (10 mg total) by mouth daily.  30 tablet  0  . aspirin 81 MG tablet Take 81 mg by mouth every morning.       Marland Kitchen atorvastatin (LIPITOR) 10 MG tablet TAKE 1 TABLET DAILY  90 tablet  2  . budesonide (PULMICORT) 1 MG/2ML nebulizer solution Place 1 mg into the nose daily.       Marland Kitchen CIALIS 20 MG tablet as directed.      Marland Kitchen DIOVAN HCT 320-12.5 MG per tablet TAKE 1 TABLET DAILY  90 tablet  1  . diphenhydrAMINE (BENADRYL) 25 mg capsule Take 25 mg by mouth every 6 (six) hours as needed. PAIN       . GuaiFENesin (MUCUS-ER PO) Take 1 tablet by mouth as needed. One or two tablets as needed      . latanoprost (XALATAN) 0.005 % ophthalmic solution Place 1 drop into both eyes at bedtime.       . metFORMIN (GLUCOPHAGE) 500 MG tablet TAKE ONE TABLET BY MOUTH EVERY DAY  90 tablet  3  . metoprolol succinate (TOPROL-XL) 50 MG 24 hr  tablet TAKE ONE-HALF (1/2) TABLET DAILY OR AS DIRECTED  90 tablet  2  . NASACORT AQ 55 MCG/ACT nasal inhaler USE 1 TO 2 SPRAYS NASALLY DAILY AS DIRECTED  16.5 g  3  . NIASPAN 1000 MG CR tablet TAKE 2 TABLETS AT BEDTIME  180 tablet  3  . spironolactone (ALDACTONE) 25 MG tablet Take 25 mg by mouth daily.       Marland Kitchen zolpidem (AMBIEN) 5 MG tablet TAKE ONE TO TWO TABLETS BY MOUTH AT BEDTIME AS NEEDED FOR SLEEP  60 tablet  3  . [DISCONTINUED] amLODipine (NORVASC) 10 MG tablet TAKE 1 TABLET DAILY  90 tablet  0  . [DISCONTINUED] spironolactone (ALDACTONE) 25 MG tablet TAKE ONE TABLET BY MOUTH EVERY DAY.  30 tablet  9    Allergies  Allergen Reactions  . Codeine     anxiety  . Hydrocodone     anxiety  . Azithromycin Rash    Patient Active Problem List  Diagnosis  . Ischemic heart disease  . Dyslipidemia  . Hyperkalemia  . Hypercholesterolemia  . Diabetes mellitus  . Benign hypertensive heart disease without heart failure  . Allergic rhinitis  . Osteoarthritis  . Cellulitis of left elbow and left elbow septic bursitis  . CAD s/p  coronary arthery bypass graft  . BPH (benign prostatic hyperplasia)  . Kidney disease, chronic, stage III (GFR 30-59 ml/min)  . H/O Spinal stenosis    History  Smoking status  . Former Smoker -- 5 years  . Types: Cigarettes  . Quit date: 08/05/1958  Smokeless tobacco  . Never Used    History  Alcohol Use No    Family History  Problem Relation Age of Onset  . Heart attack Father   . Heart disease Father     Review of Systems: Constitutional: no fever chills diaphoresis or fatigue or change in weight.  Head and neck: no hearing loss, no epistaxis, no photophobia or visual disturbance. Respiratory: No cough, shortness of breath or wheezing. Cardiovascular: No chest pain peripheral edema, palpitations. Gastrointestinal: No abdominal distention, no abdominal pain, no change in bowel habits hematochezia or melena. Genitourinary: No dysuria, no  frequency, no urgency, patient does have nocturia x5 and is under the care of Dr. Amalia Hailey Musculoskeletal:No arthralgias, no back pain, no gait disturbance or myalgias. Neurological: No dizziness, no headaches, no numbness, no seizures, no syncope, no weakness, no tremors. Hematologic: No lymphadenopathy, no easy bruising. Psychiatric: No confusion, no hallucinations, no sleep disturbance.    Physical Exam: Filed Vitals:   06/17/12 1152  BP: 164/84  Pulse: 72   the general appearance reveals a well-developed well-nourished gentleman in no distress.The head and neck exam reveals pupils equal and reactive.  Extraocular movements are full.  There is no scleral icterus.  The mouth and pharynx are normal.  The neck is supple.  The carotids reveal no bruits.  The jugular venous pressure is normal.  The  thyroid is not enlarged.  There is no lymphadenopathy.  The chest is clear to percussion and auscultation.  There are no rales or rhonchi.  Expansion of the chest is symmetrical.  The precordium is quiet.  The first heart sound is normal.  The second heart sound is physiologically split.  There is no murmur gallop rub or click.  There is no abnormal lift or heave.  The abdomen is soft and nontender.  The bowel sounds are normal.  The liver and spleen are not enlarged.  There are no abdominal masses.  There are no abdominal bruits.  Extremities reveal good pedal pulses.  There is no phlebitis or edema.  There is no cyanosis or clubbing.  Strength is normal and symmetrical in all extremities.  He wears a foot brace on his left foot and ankle There is no lateralizing weakness.  There are no sensory deficits.  The skin is warm and dry.  There is no rash.     Assessment / Plan: The patient needs to work harder on weight loss.  His weight is up 8 pounds today.  We filled out a form allowing him to continue to drive.  He will also drop by some DOT physical forms for [place].  Continue same medication.  Recheck in 4  months for office visit EKG lipid panel hepatic function panel and basal metabolic panel.  Lab work today pending

## 2012-06-17 NOTE — Progress Notes (Signed)
Quick Note:  Please report to patient. The recent labs are stable. Continue same medication and careful diet. Creatinine up slightly. Drink plenty of water. ______

## 2012-06-17 NOTE — Assessment & Plan Note (Signed)
Blood pressure generally has been doing well but he ran out of his amlodipine 2 weeks ago and today his systolic pressure is higher.

## 2012-06-17 NOTE — Assessment & Plan Note (Signed)
Patient had a recent flareup of bronchitis and sinusitis and saw his ENT physician who prescribed an antibiotic with satisfactory response

## 2012-06-17 NOTE — Patient Instructions (Addendum)
Your physician recommends that you continue on your current medications as directed. Please refer to the Current Medication list given to you today.   Your physician recommends that you schedule a follow-up appointment in: 4 months   Your physician recommends that you return for a FASTING lipid profile: 4 months and today

## 2012-06-18 ENCOUNTER — Telehealth: Payer: Self-pay | Admitting: *Deleted

## 2012-06-18 NOTE — Telephone Encounter (Signed)
Message copied by Earvin Hansen on Thu Jun 18, 2012  3:30 PM ------      Message from: Darlin Coco      Created: Wed Jun 17, 2012  8:24 PM       Please report to patient.  The recent labs are stable. Continue same medication and careful diet. Creatinine up slightly.  Drink plenty of water.

## 2012-06-18 NOTE — Telephone Encounter (Signed)
Advised patient of lab results  

## 2012-06-22 DIAGNOSIS — Q120 Congenital cataract: Secondary | ICD-10-CM | POA: Diagnosis not present

## 2012-06-22 DIAGNOSIS — E291 Testicular hypofunction: Secondary | ICD-10-CM | POA: Diagnosis not present

## 2012-06-23 ENCOUNTER — Other Ambulatory Visit: Payer: Self-pay | Admitting: *Deleted

## 2012-06-23 ENCOUNTER — Telehealth: Payer: Self-pay | Admitting: Cardiology

## 2012-06-23 DIAGNOSIS — E119 Type 2 diabetes mellitus without complications: Secondary | ICD-10-CM

## 2012-06-23 DIAGNOSIS — IMO0002 Reserved for concepts with insufficient information to code with codable children: Secondary | ICD-10-CM

## 2012-06-23 NOTE — Telephone Encounter (Signed)
Walked in patient form completed for [employer] Examination form. Original given to [email or web address]

## 2012-06-29 ENCOUNTER — Telehealth: Payer: Self-pay | Admitting: Cardiology

## 2012-06-29 ENCOUNTER — Other Ambulatory Visit (INDEPENDENT_AMBULATORY_CARE_PROVIDER_SITE_OTHER): Payer: Medicare Other

## 2012-06-29 ENCOUNTER — Other Ambulatory Visit: Payer: Self-pay | Admitting: *Deleted

## 2012-06-29 DIAGNOSIS — E119 Type 2 diabetes mellitus without complications: Secondary | ICD-10-CM

## 2012-06-29 DIAGNOSIS — I119 Hypertensive heart disease without heart failure: Secondary | ICD-10-CM

## 2012-06-29 DIAGNOSIS — I2510 Atherosclerotic heart disease of native coronary artery without angina pectoris: Secondary | ICD-10-CM | POA: Diagnosis not present

## 2012-06-29 DIAGNOSIS — E7800 Pure hypercholesterolemia, unspecified: Secondary | ICD-10-CM

## 2012-06-29 DIAGNOSIS — E785 Hyperlipidemia, unspecified: Secondary | ICD-10-CM | POA: Diagnosis not present

## 2012-06-29 LAB — URINALYSIS, ROUTINE W REFLEX MICROSCOPIC
Bilirubin Urine: NEGATIVE
Leukocytes, UA: NEGATIVE
Nitrite: NEGATIVE
Specific Gravity, Urine: >=1.03 (ref 1.000–1.030)
Total Protein, Urine: 30
pH: 6 (ref 5.0–8.0)

## 2012-06-29 NOTE — Telephone Encounter (Signed)
Wanted to let us know he would be late for lab appointment.

## 2012-06-29 NOTE — Telephone Encounter (Signed)
New problem:    Need to discuss lab appt that he has today.

## 2012-06-30 ENCOUNTER — Telehealth: Payer: Self-pay | Admitting: *Deleted

## 2012-06-30 NOTE — Telephone Encounter (Signed)
Message copied by Earvin Hansen on Tue Jun 30, 2012  4:35 PM ------      Message from: Darlin Coco      Created: Mon Jun 29, 2012  5:36 PM       Please report.  The urinalysis is satisfactory.  No evidence of infection.

## 2012-07-15 DIAGNOSIS — E291 Testicular hypofunction: Secondary | ICD-10-CM | POA: Diagnosis not present

## 2012-07-17 ENCOUNTER — Other Ambulatory Visit: Payer: Self-pay

## 2012-07-17 MED ORDER — DIOVAN HCT 320-12.5 MG PO TABS: 1.0000 | ORAL_TABLET | Freq: Every day | ORAL | Status: DC

## 2012-07-24 DIAGNOSIS — M1710 Unilateral primary osteoarthritis, unspecified knee: Secondary | ICD-10-CM | POA: Diagnosis not present

## 2012-07-31 DIAGNOSIS — J329 Chronic sinusitis, unspecified: Secondary | ICD-10-CM | POA: Insufficient documentation

## 2012-08-06 DIAGNOSIS — E291 Testicular hypofunction: Secondary | ICD-10-CM | POA: Diagnosis not present

## 2012-08-13 ENCOUNTER — Telehealth: Payer: Self-pay | Admitting: Cardiology

## 2012-08-13 DIAGNOSIS — J329 Chronic sinusitis, unspecified: Secondary | ICD-10-CM

## 2012-08-13 MED ORDER — AMOXICILLIN 500 MG PO CAPS: 500.0000 mg | ORAL_CAPSULE | Freq: Three times a day (TID) | ORAL | Status: DC

## 2012-08-13 NOTE — Telephone Encounter (Signed)
Will forward to  Dr. Mare Ferrari for review, patient allergic to Zpak

## 2012-08-13 NOTE — Telephone Encounter (Signed)
Amoxicillin 500 mg 3 times a day #20

## 2012-08-13 NOTE — Telephone Encounter (Signed)
Pt has sinus infection and wants a antibiotic called in.

## 2012-08-13 NOTE — Telephone Encounter (Signed)
Left message at home and on cell

## 2012-08-18 ENCOUNTER — Other Ambulatory Visit: Payer: Self-pay | Admitting: *Deleted

## 2012-08-18 ENCOUNTER — Other Ambulatory Visit: Payer: Self-pay

## 2012-08-18 MED ORDER — AMLODIPINE BESYLATE 10 MG PO TABS: 10.0000 mg | ORAL_TABLET | Freq: Every day | ORAL | Status: DC

## 2012-08-18 MED ORDER — NASACORT AQ 55 MCG/ACT NA AERO: 1.0000 | INHALATION_SPRAY | Freq: Every day | NASAL | Status: DC

## 2012-08-18 MED ORDER — ATORVASTATIN CALCIUM 10 MG PO TABS: 10.0000 mg | ORAL_TABLET | Freq: Every day | ORAL | Status: DC

## 2012-08-18 NOTE — Telephone Encounter (Signed)
Error in the receiving of script;not in refill amount.Will call pharmacy for refill.

## 2012-08-18 NOTE — Telephone Encounter (Signed)
Fax Received. Refill Completed. Nilah Belcourt Chowoe (R.M.A)   

## 2012-08-26 DIAGNOSIS — E291 Testicular hypofunction: Secondary | ICD-10-CM | POA: Diagnosis not present

## 2012-09-08 ENCOUNTER — Telehealth: Payer: Self-pay | Admitting: Cardiology

## 2012-09-08 DIAGNOSIS — J329 Chronic sinusitis, unspecified: Secondary | ICD-10-CM

## 2012-09-08 MED ORDER — AMOXICILLIN 500 MG PO CAPS: 500.0000 mg | ORAL_CAPSULE | Freq: Three times a day (TID) | ORAL | Status: DC

## 2012-09-08 NOTE — Telephone Encounter (Signed)
Pt called for an appt asap with brackbill this week, offered the PA,  Pt said just to have Rip Harbour give him a call, asked what trouble he was having, pt states congestion

## 2012-09-08 NOTE — Telephone Encounter (Signed)
Chest congestion did get a little better with the Amoxil Rx (given on 1/9)  but has come back.  Patient would like another coarse of antibiotics.  Denies fever. Will forward to  Dr. Mare Ferrari for review

## 2012-09-08 NOTE — Telephone Encounter (Signed)
Advised patient and sent to pharmacy  

## 2012-09-08 NOTE — Telephone Encounter (Signed)
Okay to repeat another course of amoxicillin

## 2012-09-21 DIAGNOSIS — E291 Testicular hypofunction: Secondary | ICD-10-CM | POA: Diagnosis not present

## 2012-09-23 ENCOUNTER — Other Ambulatory Visit: Payer: Self-pay | Admitting: *Deleted

## 2012-09-23 MED ORDER — SPIRONOLACTONE 25 MG PO TABS: 25.0000 mg | ORAL_TABLET | Freq: Every day | ORAL | Status: DC

## 2012-09-23 MED ORDER — DIOVAN HCT 320-12.5 MG PO TABS: 1.0000 | ORAL_TABLET | Freq: Every day | ORAL | Status: DC

## 2012-10-12 ENCOUNTER — Other Ambulatory Visit: Payer: Self-pay | Admitting: *Deleted

## 2012-10-12 DIAGNOSIS — G4700 Insomnia, unspecified: Secondary | ICD-10-CM

## 2012-10-12 DIAGNOSIS — E291 Testicular hypofunction: Secondary | ICD-10-CM | POA: Diagnosis not present

## 2012-10-12 MED ORDER — ZOLPIDEM TARTRATE 5 MG PO TABS: ORAL_TABLET | ORAL | Status: DC

## 2012-10-13 ENCOUNTER — Other Ambulatory Visit: Payer: Self-pay

## 2012-10-13 NOTE — Telephone Encounter (Signed)
[facility] because they need and another drug to be called in place of flonase

## 2012-10-19 ENCOUNTER — Encounter: Payer: Self-pay | Admitting: Cardiology

## 2012-10-19 ENCOUNTER — Other Ambulatory Visit (INDEPENDENT_AMBULATORY_CARE_PROVIDER_SITE_OTHER): Payer: Medicare Other

## 2012-10-19 ENCOUNTER — Ambulatory Visit (INDEPENDENT_AMBULATORY_CARE_PROVIDER_SITE_OTHER): Payer: Medicare Other | Admitting: Cardiology

## 2012-10-19 VITALS — BP 129/75 | HR 93 | Ht 71.0 in | Wt 204.6 lb

## 2012-10-19 DIAGNOSIS — K5900 Constipation, unspecified: Secondary | ICD-10-CM | POA: Diagnosis not present

## 2012-10-19 DIAGNOSIS — I2510 Atherosclerotic heart disease of native coronary artery without angina pectoris: Secondary | ICD-10-CM | POA: Diagnosis not present

## 2012-10-19 DIAGNOSIS — E785 Hyperlipidemia, unspecified: Secondary | ICD-10-CM

## 2012-10-19 DIAGNOSIS — R0989 Other specified symptoms and signs involving the circulatory and respiratory systems: Secondary | ICD-10-CM

## 2012-10-19 DIAGNOSIS — E7800 Pure hypercholesterolemia, unspecified: Secondary | ICD-10-CM

## 2012-10-19 LAB — BASIC METABOLIC PANEL
BUN: 34 mg/dL — ABNORMAL HIGH (ref 6–23)
Creatinine, Ser: 2.2 mg/dL — ABNORMAL HIGH (ref 0.4–1.5)
GFR: 31.15 mL/min — ABNORMAL LOW (ref >60.00)
Potassium: 4 mEq/L (ref 3.5–5.1)

## 2012-10-19 LAB — HEPATIC FUNCTION PANEL
ALT: 21 U/L (ref 0–53)
Albumin: 3.9 g/dL (ref 3.5–5.2)
Bilirubin, Direct: 0.3 mg/dL (ref 0.0–0.3)
Total Protein: 6.9 g/dL (ref 6.0–8.3)

## 2012-10-19 LAB — LIPID PANEL
HDL: 31.2 mg/dL — ABNORMAL LOW (ref >39.00)
VLDL: 16.8 mg/dL (ref 0.0–40.0)

## 2012-10-19 MED ORDER — MOMETASONE FUROATE 50 MCG/ACT NA SUSP: 2.0000 | Freq: Every day | NASAL | Status: DC

## 2012-10-19 NOTE — Assessment & Plan Note (Signed)
The patient has been experiencing some constipation and requests a colonoscopy with Dr. Henrene Pastor.  We will arrange for a office visit with Dr. Henrene Pastor to discuss colonoscopy.  Patient mentioned that his last colonoscopy was done at [facility].

## 2012-10-19 NOTE — Assessment & Plan Note (Signed)
The patient is a history of dyslipidemia with low HDL levels.  He is on Lipitor and Niaspan.  Is not having any myalgias

## 2012-10-19 NOTE — Assessment & Plan Note (Signed)
He still having problems with allergic rhinitis and he will use Nasonex spray 2 sprays in each nostril daily when necessary

## 2012-10-19 NOTE — Patient Instructions (Addendum)
Will obtain labs today and call you with the results (LP./BMET/HFP)  WILL CHANGE YOUR NASACORT TO NASONEX 2 SPRAYS EACH NOSTRIL DAILY  Your physician wants you to follow-up in: 4 months with fasting labs (lp/bmet/hfp)  You will receive a reminder letter in the mail two months in advance. If you don't receive a letter, please call our office to schedule the follow-up appointment.

## 2012-10-19 NOTE — Progress Notes (Signed)
Shane [name] Date of Birth:  10-Mar-1934 [facility] [address] [place], [place]  [address] [phone]         Fax   [phone]  History of Present Illness: This pleasant 79 year old gentleman is seen for a scheduled followup office visit. He has a history of ischemic heart disease with ischemic cardiomyopathy. He had a nuclear stress test in 06/05/11 which showed no ischemia. Also showed that his left ventricular ejection fraction had increased from 20% to 24%. The patient has not been experiencing any dizziness or syncope. He said no palpitations. He has not had any symptoms of congestive heart failure. On 08/01/11 he underwent successful right total knee replacement. He did very well in the physical therapy following knee replacement. Since last visit he has had no new cardiac symptoms.  Since last visit he has lost 9 pounds intentionally.  Since we last saw him he had a bad sinus infection which required 2 rounds of antibiotics.  He is still having a lot of sinus stuffiness and we gave him a prescription of Nasonex today since his previous nasal inhaler is no longer available by prescription. The patient has been having some mild constipation.  It has been about 5 years since he had a colonoscopy and he would like to make arrangements for a colonoscopy with Dr. Henrene Pastor.  We will make that referral.  The patient has not been noticing any blood in his stools.   Current Outpatient Prescriptions  Medication Sig Dispense Refill  . acetaminophen (TYLENOL) 500 MG tablet Take 500 mg by mouth as needed for pain.      Marland Kitchen amLODipine (NORVASC) 10 MG tablet Take 1 tablet (10 mg total) by mouth daily.  90 tablet  0  . aspirin 81 MG tablet Take 81 mg by mouth every morning.       Marland Kitchen atorvastatin (LIPITOR) 10 MG tablet Take 1 tablet (10 mg total) by mouth daily.  90 tablet  3  . budesonide (PULMICORT) 1 MG/2ML nebulizer solution Place 1 mg into the nose daily.       .  Cholecalciferol (VITAMIN D-3) 1000 UNITS CAPS Take 2,000 Units by mouth daily.      Marland Kitchen CIALIS 20 MG tablet as directed.      Marland Kitchen DIOVAN HCT 320-12.5 MG per tablet Take 1 tablet by mouth daily.  90 tablet  2  . diphenhydrAMINE (BENADRYL) 25 mg capsule Take 25 mg by mouth every 6 (six) hours as needed. PAIN       . GuaiFENesin (MUCUS-ER PO) Take 1 tablet by mouth as needed. One or two tablets as needed      . latanoprost (XALATAN) 0.005 % ophthalmic solution Place 1 drop into both eyes at bedtime.       . metFORMIN (GLUCOPHAGE) 500 MG tablet TAKE ONE TABLET BY MOUTH EVERY DAY  90 tablet  3  . metoprolol succinate (TOPROL-XL) 50 MG 24 hr tablet TAKE ONE-HALF (1/2) TABLET DAILY OR AS DIRECTED  90 tablet  2  . mometasone (NASONEX) 50 MCG/ACT nasal spray Place 2 sprays into the nose daily.  51 g  3  . Multiple Vitamin (MULTIVITAMIN) tablet Take 1 tablet by mouth daily.      . naproxen sodium (ANAPROX) 220 MG tablet Take 220 mg by mouth as needed.      Marland Kitchen NIASPAN 1000 MG CR tablet TAKE 2 TABLETS AT BEDTIME  180 tablet  3  . spironolactone (ALDACTONE) 25 MG tablet Take 1  tablet (25 mg total) by mouth daily.  90 tablet  4  . zolpidem (AMBIEN) 5 MG tablet Take 1 to 2 tablets at bedtime as needed  60 tablet  5   No current facility-administered medications for this visit.    Allergies  Allergen Reactions  . Codeine     anxiety  . Hydrocodone     anxiety  . Azithromycin Rash    Patient Active Problem List  Diagnosis  . Ischemic heart disease  . Dyslipidemia  . Hyperkalemia  . Hypercholesterolemia  . Diabetes mellitus  . Benign hypertensive heart disease without heart failure  . Allergic rhinitis  . Osteoarthritis  . Cellulitis of left elbow and left elbow septic bursitis  . CAD s/p coronary arthery bypass graft  . BPH (benign prostatic hyperplasia)  . Kidney disease, chronic, stage III (GFR 30-59 ml/min)  . H/O Spinal stenosis    History  Smoking status  . Former Smoker -- 5 years  .  Types: Cigarettes  . Quit date: 08/05/1958  Smokeless tobacco  . Never Used    History  Alcohol Use No    Family History  Problem Relation Age of Onset  . Heart attack Father   . Heart disease Father     Review of Systems: Constitutional: no fever chills diaphoresis or fatigue or change in weight.  Head and neck: no hearing loss, no epistaxis, no photophobia or visual disturbance. Respiratory: No cough, shortness of breath or wheezing. Cardiovascular: No chest pain peripheral edema, palpitations. Gastrointestinal: No abdominal distention, no abdominal pain, no change in bowel habits hematochezia or melena. Genitourinary: No dysuria, no frequency, no urgency, no nocturia. Musculoskeletal:No arthralgias, no back pain, no gait disturbance or myalgias. Neurological: No dizziness, no headaches, no numbness, no seizures, no syncope, no weakness, no tremors. Hematologic: No lymphadenopathy, no easy bruising. Psychiatric: No confusion, no hallucinations, no sleep disturbance.    Physical Exam: Filed Vitals:   10/19/12 1236  BP: 129/75  Pulse: 93   the general appearance reveals a well-developed well-nourished gentleman in no distress.The head and neck exam reveals pupils equal and reactive.  Extraocular movements are full.  There is no scleral icterus.  The mouth and pharynx are normal.  The neck is supple.  The carotids reveal no bruits.  The jugular venous pressure is normal.  The  thyroid is not enlarged.  There is no lymphadenopathy.  The chest is clear to percussion and auscultation.  There are no rales or rhonchi.  Expansion of the chest is symmetrical.  The precordium is quiet.  The first heart sound is normal.  The second heart sound is physiologically split.  There is no murmur gallop rub or click.  There is no abnormal lift or heave.  The abdomen is soft and nontender.  The bowel sounds are normal.  The liver and spleen are not enlarged.  There are no abdominal masses.  There are  no abdominal bruits.  Extremities reveal good pedal pulses.  There is no phlebitis or edema.  There is no cyanosis or clubbing.  Strength reveals that he has a left foot drop and wears a brace. There are no sensory deficits.  The skin is warm and dry.  There is no rash.  EKG shows normal sinus rhythm with occasional PVC.  He has a left bundle branch block and since 12/16/11, there is no significant change  Assessment / Plan:  Continue on same medication.  We are checking fasting lab work today.  Recheck in  4 months for followup office visit and lab work.  Referral for consideration of colonoscopy

## 2012-10-19 NOTE — Progress Notes (Signed)
Quick Note:  Please report to patient. The recent labs are stable. Continue careful diet. The kidneys are drier so drink more water and reduce the spironolactone to just QOD. Cholesterol total only 93 so decrease Niaspan to just one tablet daily at HS ______

## 2012-10-19 NOTE — Assessment & Plan Note (Signed)
The patient has not been experiencing any exertional chest pain or angina.  No dizzy spells or syncope.  No palpitations.

## 2012-10-20 ENCOUNTER — Telehealth: Payer: Self-pay | Admitting: *Deleted

## 2012-10-20 ENCOUNTER — Encounter: Payer: Self-pay | Admitting: Internal Medicine

## 2012-10-20 NOTE — Telephone Encounter (Signed)
Scheduled appointment with Dr Henrene Pastor for 11/16/12 at 8:45, advised patient to arrive 15 minutes early

## 2012-10-20 NOTE — Telephone Encounter (Signed)
Advised patient of lab results and medication changes  °

## 2012-10-20 NOTE — Telephone Encounter (Signed)
Message copied by Earvin Hansen on Tue Oct 20, 2012  8:46 AM ------      Message from: Darlin Coco      Created: Mon Oct 19, 2012  7:18 PM       Please report to patient.  The recent labs are stable. Continue  careful diet. The kidneys are drier so drink more water and reduce the spironolactone to just QOD.      Cholesterol total only 93 so decrease Niaspan to just one tablet daily at HS ------

## 2012-10-27 DIAGNOSIS — J309 Allergic rhinitis, unspecified: Secondary | ICD-10-CM | POA: Diagnosis not present

## 2012-10-27 DIAGNOSIS — J329 Chronic sinusitis, unspecified: Secondary | ICD-10-CM | POA: Diagnosis not present

## 2012-11-02 DIAGNOSIS — E291 Testicular hypofunction: Secondary | ICD-10-CM | POA: Diagnosis not present

## 2012-11-04 ENCOUNTER — Other Ambulatory Visit: Payer: Self-pay | Admitting: *Deleted

## 2012-11-04 MED ORDER — METFORMIN HCL 500 MG PO TABS: 500.0000 mg | ORAL_TABLET | Freq: Every day | ORAL | Status: DC

## 2012-11-09 DIAGNOSIS — H4011X0 Primary open-angle glaucoma, stage unspecified: Secondary | ICD-10-CM | POA: Diagnosis not present

## 2012-11-09 DIAGNOSIS — H52209 Unspecified astigmatism, unspecified eye: Secondary | ICD-10-CM | POA: Diagnosis not present

## 2012-11-09 DIAGNOSIS — H409 Unspecified glaucoma: Secondary | ICD-10-CM | POA: Diagnosis not present

## 2012-11-09 DIAGNOSIS — E119 Type 2 diabetes mellitus without complications: Secondary | ICD-10-CM | POA: Diagnosis not present

## 2012-11-16 ENCOUNTER — Ambulatory Visit: Payer: Medicare Other | Admitting: Internal Medicine

## 2012-11-18 ENCOUNTER — Ambulatory Visit: Payer: Medicare Other | Admitting: Internal Medicine

## 2012-11-23 DIAGNOSIS — E291 Testicular hypofunction: Secondary | ICD-10-CM | POA: Diagnosis not present

## 2012-12-01 ENCOUNTER — Encounter: Payer: Self-pay | Admitting: Cardiology

## 2012-12-07 ENCOUNTER — Other Ambulatory Visit: Payer: Self-pay | Admitting: *Deleted

## 2012-12-07 MED ORDER — METOPROLOL SUCCINATE ER 50 MG PO TB24: ORAL_TABLET | ORAL | Status: DC

## 2012-12-14 ENCOUNTER — Ambulatory Visit (INDEPENDENT_AMBULATORY_CARE_PROVIDER_SITE_OTHER): Payer: Medicare Other | Admitting: Internal Medicine

## 2012-12-14 ENCOUNTER — Encounter: Payer: Self-pay | Admitting: Internal Medicine

## 2012-12-14 VITALS — BP 120/62 | HR 80 | Ht 69.5 in | Wt 205.5 lb

## 2012-12-14 DIAGNOSIS — K5900 Constipation, unspecified: Secondary | ICD-10-CM

## 2012-12-14 DIAGNOSIS — E291 Testicular hypofunction: Secondary | ICD-10-CM | POA: Diagnosis not present

## 2012-12-14 DIAGNOSIS — Z86010 Personal history of colonic polyps: Secondary | ICD-10-CM | POA: Diagnosis not present

## 2012-12-14 DIAGNOSIS — E1059 Type 1 diabetes mellitus with other circulatory complications: Secondary | ICD-10-CM

## 2012-12-14 MED ORDER — MOVIPREP 100 G PO SOLR: 1.0000 | Freq: Once | ORAL | Status: DC

## 2012-12-14 NOTE — Patient Instructions (Addendum)
You have been scheduled for a colonoscopy at [facility] with propofol. Please follow written instructions given to you at your visit today.  Please pick up your prep kit at the pharmacy within the next 1-3 days.  If you use inhalers (even only as needed), please bring them with you on the day of your procedure.  Your physician has requested that you go to www.startemmi.com and enter the access code given to you at your visit today. This web site gives a general overview about your procedure. However, you should still follow specific instructions given to you by our office regarding your preparation for the procedure.

## 2012-12-14 NOTE — Progress Notes (Signed)
HISTORY OF PRESENT ILLNESS:  Shane [name] is a 79 y.o. male with multiple medical problems as listed below. He presents today, with the support of his primary provider, regarding surveillance colonoscopy. The patient last underwent colonoscopy November 2006. The examination was incomplete without visualization of the right colon. He had 2 polyps removed including a 13 mm right-sided adenoma. He apparently underwent some form of radiology exam thereafter for clearance of the proximal colon. He had been doing well with no GI complaints aside from mild constipation. He does have multiple medical problems including hypertension, diabetes, hyperlipidemia, coronary artery disease with ischemic cardiomyopathy. Last ejection fraction estimated to be 24%. Patient is active. He has had no problems recently. Review of outside blood work from March is remarkable for a creatinine of 2.2. Hemoglobin from 18 months ago was 11.1. He takes metformin for diabetes. No blood thinners or antiplatelet agents aside from baby aspirin.  REVIEW OF SYSTEMS:  All non-GI ROS negative except for back pain, sinus and allergy trouble, cough, visual change, hearing problems, left foot drop.  Past Medical History  Diagnosis Date  . Coronary artery disease     remote CABG in 1985, cath in 2003 by Dr. Lia Foyer with no PCI, last nuclear in 2010 showing scar and EF of 28%.   . Dyslipidemia   . Hyperkalemia   . Hypercholesterolemia   . Left ventricular dysfunction     28% per nuclear in 2010 and 35 to 40% per echo in 2010  . HTN (hypertension)   . Diabetes mellitus   . Cellulitis of arm, left     MSSA  . CKD (chronic kidney disease), stage III   . Spinal stenosis   . BPH (benign prostatic hyperplasia)   . Allergic rhinitis   . Abnormal nuclear cardiac imaging test Nov 2010    moderate area of infarct in the inferior wall with only minimal reversibility and EF of 28%  . ED (erectile dysfunction)   . Myocardial infarction      1985  . Pneumonia     hx of several times years ago   . Blood transfusion     ? at time of bypass surgery   . Arthritis     osteoarthritis   . Neuromuscular disorder   . Colon polyps     adenomatous    Past Surgical History  Procedure Laterality Date  . Cardiac catheterization  2003  . Coronary artery bypass graft  1985  . Lumbar disc surgery    . Prostate surgery    . Back surgery      hx of back surgery x 4   . Total knee arthroplasty  08/01/2011    Procedure: TOTAL KNEE ARTHROPLASTY;  Surgeon: Johnn Hai;  Location: [facility];  Service: Orthopedics;  Laterality: Right;    Social History ESTUARDO SIELOFF  reports that he quit smoking about 54 years ago. His smoking use included Cigarettes. He smoked 0.00 packs per day for 5 years. He has never used smokeless tobacco. He reports that he does not drink alcohol or use illicit drugs.  family history includes Bone cancer in his brother; Colon polyps in his father; Diabetes in his sister; Heart attack in his father; Heart disease in his brother and father; and Lung cancer in his mother.  Allergies  Allergen Reactions  . Codeine     anxiety  . Hydrocodone     anxiety  . Azithromycin Rash       PHYSICAL EXAMINATION:  Vital signs: BP 120/62  Pulse 80  Ht 5' 9.5" (1.765 m)  Wt 205 lb 8 oz (93.214 kg)  BMI 29.92 kg/m2  Constitutional: generally well-appearing, no acute distress Psychiatric: alert and oriented x3, cooperative Eyes: extraocular movements intact, anicteric, conjunctiva pink Mouth: oral pharynx moist, no lesions Neck: supple no lymphadenopathy Cardiovascular: heart regular rate and rhythm, no murmur Lungs: clear to auscultation bilaterally Abdomen: soft, nontender, nondistended, no obvious ascites, no peritoneal signs, normal bowel sounds, no organomegaly Rectal: Deferred until colonoscopy Extremities: no lower extremity edema bilaterally Skin: no lesions on visible extremities Neuro: No focal deficits.    ASSESSMENT:  #1. History of advanced adenoma 2006. Patient is interested in surveillance colonoscopy. He is an appropriate candidate without contraindication. Last colonoscopy incomplete. #2. Mild constipation. Stool softeners as needed #3. Multiple medical problems. Stable   PLAN:  #1. Surveillance colonoscopy.The nature of the procedure, as well as the risks, benefits, and alternatives were carefully and thoroughly reviewed with the patient. Ample time for discussion and questions allowed. The patient understood, was satisfied, and agreed to proceed. The patient is high-risk given his comorbidities. Because of his ejection fraction, the examination will be performed at the hospital. We will request CRNA monitored propofol for the examination. #2. Movi prep prescribed. The patient instructed on its use. #3. Hold oral diabetic agent the day of the examination until normal by mouth intake has resumed.

## 2012-12-15 DIAGNOSIS — H2589 Other age-related cataract: Secondary | ICD-10-CM | POA: Diagnosis not present

## 2012-12-15 DIAGNOSIS — H2510 Age-related nuclear cataract, unspecified eye: Secondary | ICD-10-CM | POA: Diagnosis not present

## 2012-12-15 DIAGNOSIS — H25019 Cortical age-related cataract, unspecified eye: Secondary | ICD-10-CM | POA: Diagnosis not present

## 2013-01-08 ENCOUNTER — Encounter (HOSPITAL_COMMUNITY): Payer: Self-pay | Admitting: *Deleted

## 2013-01-08 DIAGNOSIS — H409 Unspecified glaucoma: Secondary | ICD-10-CM

## 2013-01-08 HISTORY — DX: Unspecified glaucoma: H40.9

## 2013-01-12 DIAGNOSIS — H2589 Other age-related cataract: Secondary | ICD-10-CM | POA: Diagnosis not present

## 2013-01-12 DIAGNOSIS — H2510 Age-related nuclear cataract, unspecified eye: Secondary | ICD-10-CM | POA: Diagnosis not present

## 2013-01-12 DIAGNOSIS — H2181 Floppy iris syndrome: Secondary | ICD-10-CM | POA: Diagnosis not present

## 2013-01-12 DIAGNOSIS — H25019 Cortical age-related cataract, unspecified eye: Secondary | ICD-10-CM | POA: Diagnosis not present

## 2013-01-14 DIAGNOSIS — E291 Testicular hypofunction: Secondary | ICD-10-CM | POA: Diagnosis not present

## 2013-01-21 ENCOUNTER — Encounter (HOSPITAL_COMMUNITY): Payer: Self-pay | Admitting: Pharmacy Technician

## 2013-01-25 ENCOUNTER — Encounter (HOSPITAL_COMMUNITY): Payer: Self-pay | Admitting: Anesthesiology

## 2013-01-25 ENCOUNTER — Ambulatory Visit (HOSPITAL_COMMUNITY): Payer: Medicare Other | Admitting: Anesthesiology

## 2013-01-25 ENCOUNTER — Encounter (HOSPITAL_COMMUNITY): Payer: Self-pay | Admitting: *Deleted

## 2013-01-25 ENCOUNTER — Encounter (HOSPITAL_COMMUNITY)
Admission: RE | Disposition: A | Discharge status: Home / Self Care | Payer: Self-pay | Source: Ambulatory Visit | Attending: Internal Medicine

## 2013-01-25 ENCOUNTER — Ambulatory Visit (HOSPITAL_COMMUNITY)
Admission: RE | Admit: 2013-01-25 | Discharge: 2013-01-25 | Disposition: A | Discharge status: Home / Self Care | Payer: Medicare Other | Source: Ambulatory Visit | Attending: Internal Medicine | Admitting: Internal Medicine

## 2013-01-25 DIAGNOSIS — Z951 Presence of aortocoronary bypass graft: Secondary | ICD-10-CM | POA: Diagnosis not present

## 2013-01-25 DIAGNOSIS — Z860100 Personal history of colon polyps, unspecified: Secondary | ICD-10-CM | POA: Diagnosis not present

## 2013-01-25 DIAGNOSIS — I252 Old myocardial infarction: Secondary | ICD-10-CM | POA: Diagnosis not present

## 2013-01-25 DIAGNOSIS — I129 Hypertensive chronic kidney disease with stage 1 through stage 4 chronic kidney disease, or unspecified chronic kidney disease: Secondary | ICD-10-CM | POA: Diagnosis not present

## 2013-01-25 DIAGNOSIS — K5730 Diverticulosis of large intestine without perforation or abscess without bleeding: Secondary | ICD-10-CM | POA: Diagnosis not present

## 2013-01-25 DIAGNOSIS — D126 Benign neoplasm of colon, unspecified: Secondary | ICD-10-CM | POA: Diagnosis not present

## 2013-01-25 DIAGNOSIS — I2510 Atherosclerotic heart disease of native coronary artery without angina pectoris: Secondary | ICD-10-CM | POA: Diagnosis not present

## 2013-01-25 DIAGNOSIS — E1059 Type 1 diabetes mellitus with other circulatory complications: Secondary | ICD-10-CM

## 2013-01-25 DIAGNOSIS — E119 Type 2 diabetes mellitus without complications: Secondary | ICD-10-CM | POA: Diagnosis not present

## 2013-01-25 DIAGNOSIS — N189 Chronic kidney disease, unspecified: Secondary | ICD-10-CM | POA: Insufficient documentation

## 2013-01-25 HISTORY — DX: Unspecified glaucoma: H40.9

## 2013-01-25 HISTORY — PX: COLONOSCOPY: SHX5424

## 2013-01-25 LAB — GLUCOSE, CAPILLARY: Glucose-Capillary: 112 mg/dL — ABNORMAL HIGH (ref 70–99)

## 2013-01-25 SURGERY — COLONOSCOPY
Anesthesia: Monitor Anesthesia Care

## 2013-01-25 MED ORDER — PROPOFOL 10 MG/ML IV BOLUS
INTRAVENOUS | Status: DC | PRN
  Administered 2013-01-25: 20 mg via INTRAVENOUS
  Administered 2013-01-25: 80 mg via INTRAVENOUS

## 2013-01-25 MED ORDER — PROPOFOL INFUSION 10 MG/ML OPTIME
INTRAVENOUS | Status: DC | PRN
  Administered 2013-01-25: 140 ug/kg/min via INTRAVENOUS

## 2013-01-25 MED ORDER — LACTATED RINGERS IV SOLN
INTRAVENOUS | Status: DC | PRN
  Administered 2013-01-25: 11:00:00 via INTRAVENOUS

## 2013-01-25 MED ORDER — SODIUM CHLORIDE 0.9 % IV SOLN: INTRAVENOUS | Status: DC

## 2013-01-25 NOTE — Anesthesia Postprocedure Evaluation (Signed)
  Anesthesia Post-op Note  Patient: Shane [name]  Procedure(s) Performed: Procedure(s) (LRB): COLONOSCOPY (N/A)  Patient Location: PACU  Anesthesia Type: MAC  Level of Consciousness: awake and alert   Airway and Oxygen Therapy: Patient Spontanous Breathing  Post-op Pain: mild  Post-op Assessment: Post-op Vital signs reviewed, Patient's Cardiovascular Status Stable, Respiratory Function Stable, Patent Airway and No signs of Nausea or vomiting  Last Vitals:  Filed Vitals:   01/25/13 1249  BP: 95/65  Pulse: 81  Temp: 36.6 C  Resp: 20    Post-op Vital Signs: stable   Complications: No apparent anesthesia complications

## 2013-01-25 NOTE — Anesthesia Preprocedure Evaluation (Signed)
Anesthesia Evaluation  Patient identified by MRN, date of birth, ID band Patient awake    Reviewed: Allergy & Precautions, H&P , NPO status , Patient's Chart, lab work & pertinent test results  Airway Mallampati: II TM Distance: >3 FB Neck ROM: Full    Dental no notable dental hx.    Pulmonary neg pulmonary ROS,  breath sounds clear to auscultation  Pulmonary exam normal       Cardiovascular hypertension, Pt. on medications + CAD, + Past MI, + CABG and +CHF Rhythm:Regular Rate:Normal  Left ventricular dysfunction   28% per nuclear in 2010 and 35 to 40% per echo in 2010    Neuro/Psych Neuromuscular disorder   legs mild paralysis-able to walk"nerve damage"-legs- left leg brace   negative neurological ROS  negative psych ROS   GI/Hepatic negative GI ROS, Neg liver ROS,   Endo/Other  diabetes  Renal/GU Renal InsufficiencyRenal disease  negative genitourinary   Musculoskeletal negative musculoskeletal ROS (+)   Abdominal   Peds negative pediatric ROS (+)  Hematology negative hematology ROS (+)   Anesthesia Other Findings   Reproductive/Obstetrics negative OB ROS                           Anesthesia Physical Anesthesia Plan  ASA: III  Anesthesia Plan: MAC   Post-op Pain Management:    Induction: Intravenous  Airway Management Planned: Nasal Cannula  Additional Equipment:   Intra-op Plan:   Post-operative Plan:   Informed Consent: I have reviewed the patients History and Physical, chart, labs and discussed the procedure including the risks, benefits and alternatives for the proposed anesthesia with the patient or authorized representative who has indicated his/her understanding and acceptance.     Plan Discussed with: CRNA and Surgeon  Anesthesia Plan Comments:         Anesthesia Quick Evaluation

## 2013-01-25 NOTE — Transfer of Care (Signed)
Immediate Anesthesia Transfer of Care Note  Patient: Shane [name]  Procedure(s) Performed: Procedure(s): COLONOSCOPY (N/A)  Patient Location: PACU  Anesthesia Type:MAC  Level of Consciousness: awake, alert , oriented and patient cooperative  Airway & Oxygen Therapy: Patient Spontanous Breathing  Post-op Assessment: Report given to PACU RN  Post vital signs: Reviewed and stable  Complications: No apparent anesthesia complications

## 2013-01-25 NOTE — H&P (Signed)
HISTORY OF PRESENT ILLNESS:  Shane [name] is a 79 y.o. male with multiple medical problems and history of adenomatous polyps, recently seen for surveillance colonoscopy. Deemed an appropriate candidate without contraindication. No interval chance in medical history. Wife at bedside today   REVIEW OF SYSTEMS:  All non-GI ROS negative   Past Medical History  Diagnosis Date  . Coronary artery disease     remote CABG in 1985, cath in 2003 by Dr. Lia Foyer with no PCI, last nuclear in 2010 showing scar and EF of 28%.   . Dyslipidemia   . Hyperkalemia   . Hypercholesterolemia   . Left ventricular dysfunction     28% per nuclear in 2010 and 35 to 40% per echo in 2010  . HTN (hypertension)   . Diabetes mellitus   . Cellulitis of arm, left     MSSA  . Spinal stenosis   . BPH (benign prostatic hyperplasia)   . Allergic rhinitis 01-08-13    Uses nebulizer for chronic sinus issues and Mucinex.  . Abnormal nuclear cardiac imaging test Nov 2010    moderate area of infarct in the inferior wall with only minimal reversibility and EF of 28%  . Myocardial infarction     1985  . Pneumonia     hx of several times years ago   . Blood transfusion     ? at time of bypass surgery   . Arthritis     osteoarthritis   . Colon polyps     adenomatous  . Neuromuscular disorder     legs mild paralysis-able to walk"nerve damage"-legs- left leg brace   . CKD (chronic kidney disease), stage III     "was told due to meds he takes"-no Renal workup done  . Glaucoma 01-08-13    tx. eye drops    Past Surgical History  Procedure Laterality Date  . Cardiac catheterization  2003  . Lumbar disc surgery    . Prostate surgery    . Back surgery      hx of back surgery x 4   . Total knee arthroplasty  08/01/2011    Procedure: TOTAL KNEE ARTHROPLASTY;  Surgeon: Johnn Hai;  Location: [facility];  Service: Orthopedics;  Laterality: Right;  . Joint replacement Right 01-08-13    TKA  . Cataract       recent  cataract surgery 6'14  . Coronary artery bypass graft  1985    x 5 vessels    Social History Shane [name]  reports that he quit smoking about 54 years ago. His smoking use included Cigarettes. He smoked 0.00 packs per day for 5 years. He has never used smokeless tobacco. He reports that he does not drink alcohol or use illicit drugs.  family history includes Bone cancer in his brother; Colon polyps in his father; Diabetes in his sister; Heart attack in his father; Heart disease in his brother and father; and Lung cancer in his mother.  Allergies  Allergen Reactions  . Codeine     anxiety  . Hydrocodone     anxiety  . Azithromycin Rash       PHYSICAL EXAMINATION: Vital signs: BP 140/83  Temp(Src) 98.3 F (36.8 C) (Oral)  Resp 12  Ht 5\' 11"  (1.803 m)  Wt 204 lb (92.534 kg)  BMI 28.46 kg/m2  SpO2 95%  Constitutional: generally well-appearing, no acute distress Psychiatric: alert and oriented x3, cooperative Eyes: extraocular movements intact, anicteric, conjunctiva pink Mouth: oral pharynx moist, no  lesions Neck: supple no lymphadenopathy Cardiovascular: heart regular rate and rhythm, no murmur Lungs: clear to auscultation bilaterally Abdomen: soft, nontender, nondistended, no obvious ascites, no peritoneal signs, normal bowel sounds, no organomegaly Rectal: see colonoscopy report Extremities: no lower extremity edema bilaterally Skin: no lesions on visible extremities Neuro: No focal deficits.   ASSESSMENT:  1. Hx adenomatous polyps, due for follow up 2. Multiple comorbidities. Stable   PLAN:  1. Surveillance colonoscopy at hospital with CRNA/MAC propofol.The nature of the procedure, as well as the risks, benefits, and alternatives were carefully and thoroughly reviewed with the patient. Ample time for discussion and questions allowed. The patient understood, was satisfied, and agreed to proceed.   Docia Chuck. Geri Seminole., M.D. [facility] Division of  Gastroenterology

## 2013-01-25 NOTE — Preoperative (Signed)
Beta Blockers   Reason not to administer Beta Blockers:Not Applicable 

## 2013-01-25 NOTE — Op Note (Signed)
[facility] [place], [address]   COLONOSCOPY PROCEDURE REPORT  PATIENT: Shane [name], Shane [name]  MR#: [id number] BIRTHDATE: [birth year] , 79  yrs. old GENDER: Male ENDOSCOPIST: Eustace Quail, MD REFERRED [id number] Mare Ferrari, M.D. PROCEDURE DATE:  01/25/2013 PROCEDURE:   Colonoscopy with snare polypectomy  x 2 ASA CLASS:   Class III INDICATIONS:Patient's personal history of adenomatous colon polyps. 06-20-2005 (SML) w/ 48mm adenoma and incomplete exam MEDICATIONS: MAC sedation, administered by CRNA and propofol (Diprivan) 400mg  IV  DESCRIPTION OF PROCEDURE:   After the risks benefits and alternatives of the procedure were thoroughly explained, informed consent was obtained.  A digital rectal exam revealed no abnormalities of the rectum.   The Pentax Colonoscope [id number] endoscope was introduced through the anus and advanced to the cecum, which was identified by both the appendix and ileocecal valve. No adverse events experienced.   Limited by a redundant colon.   The quality of the prep was excellent, using MoviPrep  The instrument was then slowly withdrawn as the colon was fully examined.      COLON FINDINGS: Two polyps ranging between 3-55mm in size were found at the cecum and in the ascending colon.  A polypectomy was performed with a cold snare.  The resection was complete and the polyp tissue was completely retrieved.   Moderate diverticulosis was noted at the hepatic flexure and The finding was in the left colon.   The colon mucosa was otherwise normal.  Retroflexed views revealed internal hemorrhoids. The time to cecum=11 minutes 0 seconds.  Withdrawal time=15 minutes 0 seconds.  The scope was withdrawn and the procedure completed. COMPLICATIONS: There were no complications.  ENDOSCOPIC IMPRESSION: 1.   Two small polyps removed with a cold snare 2.   Moderate diverticulosis was noted at the hepatic flexure and in the left colon 3.   The  colon mucosa was otherwise normal  RECOMMENDATIONS: 1. Return to the care of your primary provider.  GI follow up as needed   eSigned:  Eustace Quail, MD 01/25/2013 12:51 PM  cc: Darlin Coco, MD and The Patient   PATIENT NAME:  Shane [name], Shane [name] MR#: [id number]

## 2013-01-26 ENCOUNTER — Encounter: Payer: Self-pay | Admitting: Internal Medicine

## 2013-01-26 ENCOUNTER — Encounter (HOSPITAL_COMMUNITY): Payer: Self-pay | Admitting: Internal Medicine

## 2013-02-04 ENCOUNTER — Other Ambulatory Visit: Payer: Self-pay | Admitting: Cardiology

## 2013-02-04 DIAGNOSIS — E291 Testicular hypofunction: Secondary | ICD-10-CM | POA: Diagnosis not present

## 2013-02-09 ENCOUNTER — Other Ambulatory Visit: Payer: Self-pay | Admitting: Cardiology

## 2013-02-16 ENCOUNTER — Telehealth: Payer: Self-pay | Admitting: *Deleted

## 2013-02-16 NOTE — Telephone Encounter (Signed)
[facility] called stating they don't have  mometasone (NASONEX) 50 MCG/ACT nasal spray  Is there another spray that can be called in to replace this. Will route this to [facility] for further review [id number] reference number is [id number]

## 2013-02-16 NOTE — Telephone Encounter (Signed)
Left message to call back  

## 2013-02-19 NOTE — Telephone Encounter (Signed)
Patient has not called back. Waiting for  Dr. Mare Ferrari to review

## 2013-02-23 ENCOUNTER — Other Ambulatory Visit: Payer: Medicare Other

## 2013-02-23 ENCOUNTER — Ambulatory Visit (INDEPENDENT_AMBULATORY_CARE_PROVIDER_SITE_OTHER): Payer: Medicare Other | Admitting: Cardiology

## 2013-02-23 ENCOUNTER — Encounter: Payer: Self-pay | Admitting: Cardiology

## 2013-02-23 VITALS — BP 140/68 | HR 64 | Ht 71.0 in | Wt 206.0 lb

## 2013-02-23 DIAGNOSIS — E785 Hyperlipidemia, unspecified: Secondary | ICD-10-CM

## 2013-02-23 DIAGNOSIS — I119 Hypertensive heart disease without heart failure: Secondary | ICD-10-CM

## 2013-02-23 DIAGNOSIS — I259 Chronic ischemic heart disease, unspecified: Secondary | ICD-10-CM

## 2013-02-23 DIAGNOSIS — E7800 Pure hypercholesterolemia, unspecified: Secondary | ICD-10-CM | POA: Diagnosis not present

## 2013-02-23 LAB — BASIC METABOLIC PANEL
CO2: 25 mEq/L (ref 19–32)
Calcium: 9.3 mg/dL (ref 8.4–10.5)
Chloride: 104 mEq/L (ref 96–112)
Sodium: 136 mEq/L (ref 135–145)

## 2013-02-23 LAB — HEPATIC FUNCTION PANEL
AST: 28 U/L (ref 0–37)
Albumin: 3.9 g/dL (ref 3.5–5.2)

## 2013-02-23 LAB — LIPID PANEL
Cholesterol: 117 mg/dL (ref 0–200)
HDL: 32.7 mg/dL — ABNORMAL LOW (ref >39.00)
Total CHOL/HDL Ratio: 4
Triglycerides: 97 mg/dL (ref 0.0–149.0)

## 2013-02-23 NOTE — Assessment & Plan Note (Signed)
The patient has a history of dyslipidemia with low HDL levels.  He has shown a response to niacin.  Blood work today is pending.

## 2013-02-23 NOTE — Telephone Encounter (Signed)
Follow up  ° ° ° ° °Pt is returning your call  °

## 2013-02-23 NOTE — Telephone Encounter (Signed)
Discussed with patient at ov today.

## 2013-02-23 NOTE — Assessment & Plan Note (Signed)
The patient is not having any symptoms of congestive heart failure.  Since coming home from his vacation trip he has had a slight tickle in his throat but no purulent sputum.  He will try Mucinex and he will let us know if further symptoms develop and if it does he may need an antibiotic.

## 2013-02-23 NOTE — Progress Notes (Signed)
Shane [name] Date of Birth:  1934-02-06 [facility] [address] [place], [place]  [address] [phone]         Fax   [phone]  History of Present Illness: This pleasant 79 year old gentleman is seen for a scheduled followup office visit. He has a history of ischemic heart disease with ischemic cardiomyopathy. He had a nuclear stress test in 06/05/11 which showed no ischemia. Also showed that his left ventricular ejection fraction had increased from 20% to 24%. The patient has not been experiencing any dizziness or syncope. He said no palpitations. He has not had any symptoms of congestive heart failure. On 08/01/11 he underwent successful right total knee replacement. He did very well in the physical therapy following knee replacement. Since last visit he has had no new cardiac symptoms. Since last visit he has lost 9 pounds intentionally. Since we last saw him he had a bad sinus infection which required 2 rounds of antibiotics. He is still having a lot of sinus stuffiness and we gave him a prescription of Nasonex today since his previous nasal inhaler is no longer available by prescription.  He and his family just returned from a trip to [place] and [place].  They did a lot of walking and he did well.    Current Outpatient Prescriptions  Medication Sig Dispense Refill  . acetaminophen (TYLENOL) 500 MG tablet Take 500 mg by mouth as needed for pain.      Marland Kitchen amLODipine (NORVASC) 10 MG tablet Take 10 mg by mouth every morning.      Marland Kitchen aspirin EC 81 MG tablet Take 81 mg by mouth every morning.      Marland Kitchen atorvastatin (LIPITOR) 10 MG tablet Take 1 tablet (10 mg total) by mouth daily.  90 tablet  3  . budesonide (PULMICORT) 1 MG/2ML nebulizer solution Place 1 mg into the nose at bedtime.       . Cholecalciferol (VITAMIN D-3) 1000 UNITS CAPS Take 2,000 Units by mouth daily.      Marland Kitchen CIALIS 20 MG tablet as directed.      Marland Kitchen DIOVAN HCT 320-12.5 MG per tablet Take 1  tablet by mouth daily.  90 tablet  2  . diphenhydrAMINE (BENADRYL) 25 mg capsule Take 25 mg by mouth every 6 (six) hours as needed for allergies.       Marland Kitchen latanoprost (XALATAN) 0.005 % ophthalmic solution Place 1 drop into both eyes at bedtime.       . metFORMIN (GLUCOPHAGE) 500 MG tablet Take 1 tablet (500 mg total) by mouth daily.  90 tablet  3  . metoprolol succinate (TOPROL-XL) 50 MG 24 hr tablet Take 25 mg by mouth every morning. Take with or immediately following a meal.      . mometasone (NASONEX) 50 MCG/ACT nasal spray Place 2 sprays into the nose daily.  51 g  3  . Multiple Vitamin (MULTIVITAMIN) tablet Take 1 tablet by mouth daily.      . niacin (NIASPAN) 1000 MG CR tablet Take 1,000 mg by mouth every morning.       . NORVASC 10 MG tablet TAKE 1 TABLET DAILY  90 tablet  3  . spironolactone (ALDACTONE) 25 MG tablet Take 25 mg by mouth every other day.       . zolpidem (AMBIEN) 5 MG tablet Take 5-10 mg by mouth at bedtime as needed for sleep.       No current facility-administered medications for this visit.  Allergies  Allergen Reactions  . Codeine     anxiety  . Hydrocodone     anxiety  . Azithromycin Rash    Patient Active Problem List   Diagnosis Date Noted  . Constipation 10/19/2012  . CAD s/p coronary arthery bypass graft   . BPH (benign prostatic hyperplasia)   . Kidney disease, chronic, stage III (GFR 30-59 ml/min)   . H/O Spinal stenosis   . Cellulitis of left elbow and left elbow septic bursitis 02/08/2011  . Diabetes mellitus 11/07/2010  . Benign hypertensive heart disease without heart failure 11/07/2010  . Allergic rhinitis 11/07/2010  . Osteoarthritis 11/07/2010  . Ischemic heart disease   . Dyslipidemia   . Hyperkalemia   . Hypercholesterolemia     History  Smoking status  . Former Smoker -- 5 years  . Types: Cigarettes  . Quit date: 08/05/1958  Smokeless tobacco  . Never Used    History  Alcohol Use No    Family History  Problem  Relation Age of Onset  . Heart attack Father   . Heart disease Father   . Colon polyps Father   . Diabetes Sister     x 2  . Heart disease Brother     x 2  . Lung cancer Mother   . Bone cancer Brother     Review of Systems: Constitutional: no fever chills diaphoresis or fatigue or change in weight.  Head and neck: no hearing loss, no epistaxis, no photophobia or visual disturbance. Respiratory: No cough, shortness of breath or wheezing. Cardiovascular: No chest pain peripheral edema, palpitations. Gastrointestinal: No abdominal distention, no abdominal pain, no change in bowel habits hematochezia or melena. Genitourinary: No dysuria, no frequency, no urgency, no nocturia. Musculoskeletal:No arthralgias, no back pain, no gait disturbance or myalgias. Neurological: No dizziness, no headaches, no numbness, no seizures, no syncope, no weakness, no tremors. Hematologic: No lymphadenopathy, no easy bruising. Psychiatric: No confusion, no hallucinations, no sleep disturbance.    Physical Exam: Filed Vitals:   02/23/13 1141  BP: 140/68  Pulse: 64   the general appearance reveals a well-developed well-nourished gentleman in no distress.The head and neck exam reveals pupils equal and reactive.  Extraocular movements are full.  There is no scleral icterus.  The mouth and pharynx are normal.  The neck is supple.  The carotids reveal no bruits.  The jugular venous pressure is normal.  The  thyroid is not enlarged.  There is no lymphadenopathy.  The chest is clear to percussion and auscultation.  There are no rales or rhonchi.  Expansion of the chest is symmetrical.  The precordium is quiet.  The first heart sound is normal.  The second heart sound is physiologically split.  There is no murmur gallop rub or click.  There is no abnormal lift or heave.  The abdomen is soft and nontender.  The bowel sounds are normal.  The liver and spleen are not enlarged.  There are no abdominal masses.  There are no  abdominal bruits.  Extremities reveal good pedal pulses.  There is no phlebitis or edema.  There is no cyanosis or clubbing.  Strength is normal and symmetrical in all extremities.  He wears a brace on his left ankle. There is no lateralizing weakness.  There are no sensory deficits.  The skin is warm and dry.  There is no rash.     Assessment / Plan: Patient is to continue same medication.  We are sending him to the lab  today for lab work.  Return in 4 months for followup office visit and fasting lipid panel hepatic function panel and basal metabolic panel.

## 2013-02-23 NOTE — Progress Notes (Signed)
Quick Note:  Please report to patient. The recent labs are stable. Continue same medication and careful diet.Kidney function is better. HDL 32.70 up slightly. ______

## 2013-02-23 NOTE — Patient Instructions (Signed)

## 2013-02-23 NOTE — Assessment & Plan Note (Signed)
The patient has not had any recurrent chest pain or angina. 

## 2013-02-24 ENCOUNTER — Telehealth: Payer: Self-pay | Admitting: Cardiology

## 2013-02-24 DIAGNOSIS — J329 Chronic sinusitis, unspecified: Secondary | ICD-10-CM

## 2013-02-24 MED ORDER — AMOXICILLIN 500 MG PO TABS: 500.0000 mg | ORAL_TABLET | Freq: Three times a day (TID) | ORAL | Status: DC

## 2013-02-24 NOTE — Telephone Encounter (Signed)
Patient states he is feeling worse and more congestion than at ov yesterday. Would like Rx phoned in, (not Zpak). Will forward to  Dr. Mare Ferrari for review.   Advised patient of lab results

## 2013-02-24 NOTE — Telephone Encounter (Signed)
New problem    Pt states he has questions regarding sinus infection

## 2013-02-24 NOTE — Telephone Encounter (Signed)
Discussed with  Dr. Mare Ferrari and will Rx Amoxil 500 mg three times a day #20 with 1 refill. Advised patient

## 2013-02-24 NOTE — Telephone Encounter (Signed)
Message copied by Earvin Hansen on Wed Feb 24, 2013 10:53 AM ------      Message from: Darlin Coco      Created: Tue Feb 23, 2013  7:22 PM       Please report to patient.  The recent labs are stable. Continue same medication and careful diet.Kidney function is better. HDL 32.70 up slightly. ------

## 2013-03-03 ENCOUNTER — Telehealth: Payer: Self-pay | Admitting: Cardiology

## 2013-03-03 DIAGNOSIS — J329 Chronic sinusitis, unspecified: Secondary | ICD-10-CM

## 2013-03-03 NOTE — Telephone Encounter (Signed)
Patient feeling some better but wanted to know if another round of antibiotics would be ok. Finished Zpak a few days ago. Did advise patient still in system up to 5 days after finishing but would check with  Dr. Mare Ferrari to see if another appropriate. Will forward to him for review

## 2013-03-03 NOTE — Telephone Encounter (Signed)
Okay to refill Zpak

## 2013-03-03 NOTE — Telephone Encounter (Signed)
New Prob  Pt would like to speak with you regarding an antibiotic

## 2013-03-04 DIAGNOSIS — E291 Testicular hypofunction: Secondary | ICD-10-CM | POA: Diagnosis not present

## 2013-03-04 MED ORDER — AMOXICILLIN 500 MG PO TABS: 500.0000 mg | ORAL_TABLET | Freq: Three times a day (TID) | ORAL | Status: DC

## 2013-03-04 NOTE — Telephone Encounter (Signed)
Patient actually received Rx for Amoxil not Zpak, discussed with  Dr. Mare Ferrari and ok to refill. Amoxil sent to pharmacy and advised patient

## 2013-03-15 DIAGNOSIS — H04209 Unspecified epiphora, unspecified lacrimal gland: Secondary | ICD-10-CM | POA: Diagnosis not present

## 2013-03-24 DIAGNOSIS — E291 Testicular hypofunction: Secondary | ICD-10-CM | POA: Diagnosis not present

## 2013-04-06 DIAGNOSIS — L570 Actinic keratosis: Secondary | ICD-10-CM | POA: Diagnosis not present

## 2013-04-06 DIAGNOSIS — D2320 Other benign neoplasm of skin of unspecified ear and external auricular canal: Secondary | ICD-10-CM | POA: Diagnosis not present

## 2013-04-06 DIAGNOSIS — C44319 Basal cell carcinoma of skin of other parts of face: Secondary | ICD-10-CM | POA: Diagnosis not present

## 2013-04-06 DIAGNOSIS — D235 Other benign neoplasm of skin of trunk: Secondary | ICD-10-CM | POA: Diagnosis not present

## 2013-04-06 DIAGNOSIS — L820 Inflamed seborrheic keratosis: Secondary | ICD-10-CM | POA: Diagnosis not present

## 2013-04-20 ENCOUNTER — Other Ambulatory Visit: Payer: Self-pay | Admitting: *Deleted

## 2013-04-20 MED ORDER — ZOLPIDEM TARTRATE 5 MG PO TABS: 5.0000 mg | ORAL_TABLET | Freq: Every evening | ORAL | Status: DC | PRN

## 2013-04-26 DIAGNOSIS — M2420 Disorder of ligament, unspecified site: Secondary | ICD-10-CM | POA: Diagnosis not present

## 2013-04-26 DIAGNOSIS — H02539 Eyelid retraction unspecified eye, unspecified lid: Secondary | ICD-10-CM | POA: Diagnosis not present

## 2013-04-26 DIAGNOSIS — H04229 Epiphora due to insufficient drainage, unspecified lacrimal gland: Secondary | ICD-10-CM | POA: Diagnosis not present

## 2013-04-26 DIAGNOSIS — L719 Rosacea, unspecified: Secondary | ICD-10-CM | POA: Diagnosis not present

## 2013-04-26 DIAGNOSIS — H11439 Conjunctival hyperemia, unspecified eye: Secondary | ICD-10-CM | POA: Diagnosis not present

## 2013-04-26 DIAGNOSIS — H02139 Senile ectropion of unspecified eye, unspecified eyelid: Secondary | ICD-10-CM | POA: Diagnosis not present

## 2013-04-28 DIAGNOSIS — E291 Testicular hypofunction: Secondary | ICD-10-CM | POA: Diagnosis not present

## 2013-05-24 DIAGNOSIS — E291 Testicular hypofunction: Secondary | ICD-10-CM | POA: Diagnosis not present

## 2013-05-31 DIAGNOSIS — Z23 Encounter for immunization: Secondary | ICD-10-CM | POA: Diagnosis not present

## 2013-06-01 ENCOUNTER — Telehealth: Payer: Self-pay | Admitting: Cardiology

## 2013-06-01 DIAGNOSIS — J329 Chronic sinusitis, unspecified: Secondary | ICD-10-CM

## 2013-06-01 MED ORDER — AMOXICILLIN 500 MG PO TABS: 500.0000 mg | ORAL_TABLET | Freq: Three times a day (TID) | ORAL | Status: DC

## 2013-06-01 NOTE — Telephone Encounter (Signed)
New message    Have sinus infection---what can he take

## 2013-06-01 NOTE — Telephone Encounter (Signed)
Discussed with  Dr. Mare Ferrari and change Amoxil to 500 mg three times a d x 10 days, advised patient

## 2013-06-01 NOTE — Telephone Encounter (Signed)
Has been using Mucinex, Tylenol, and Benadryl for three days. Getting up thick mucus, head and chest congestion. Will forward to  Dr. Mare Ferrari for review

## 2013-06-01 NOTE — Telephone Encounter (Signed)
Weekend and amoxicillin 500 mg 4 times a day #20 refill times one

## 2013-06-16 ENCOUNTER — Telehealth: Payer: Self-pay | Admitting: Cardiology

## 2013-06-16 DIAGNOSIS — J329 Chronic sinusitis, unspecified: Secondary | ICD-10-CM

## 2013-06-16 MED ORDER — AMOXICILLIN 500 MG PO TABS: 500.0000 mg | ORAL_TABLET | Freq: Three times a day (TID) | ORAL | Status: DC

## 2013-06-16 NOTE — Telephone Encounter (Signed)
New problems  Pt states that he is still having chest pains, requests a call back to discuss. Declined appt w/ PA

## 2013-06-16 NOTE — Telephone Encounter (Signed)
I talked with Dr. Mare Ferrari who states he will refill patient's Amoxicillin 500 mg TID x 10 days.  I advised patient of medication refill and to call office with worsening symptoms or concerns.  Patient verbalized understanding and agreement.

## 2013-06-16 NOTE — Telephone Encounter (Signed)
Spoke with patient who c/o chest heaviness in the center of his chest.  Patient would like to know if Dr. Mare Ferrari would like to order another round of antibiotics.  I questioned patient further - patient denies SOB, nausea, radiation of chest pain.  Patient states he continues to have productive cough with clear sputum.  Patient denies fever; states he continues to take Tussinex for cough.  Patient states it usually takes a couple of rounds of antibiotics to heal his sinus infections and bronchitis that he gets yearly.  I advised patient that I will talk to Dr. Mare Ferrari who is in the office today and will call him back with Dr. Sherryl Barters advice.  Patient verbalized understanding and agreement.

## 2013-06-17 DIAGNOSIS — Z125 Encounter for screening for malignant neoplasm of prostate: Secondary | ICD-10-CM | POA: Diagnosis not present

## 2013-06-17 DIAGNOSIS — E291 Testicular hypofunction: Secondary | ICD-10-CM | POA: Diagnosis not present

## 2013-06-23 DIAGNOSIS — H4011X0 Primary open-angle glaucoma, stage unspecified: Secondary | ICD-10-CM | POA: Diagnosis not present

## 2013-06-23 DIAGNOSIS — Z961 Presence of intraocular lens: Secondary | ICD-10-CM | POA: Diagnosis not present

## 2013-06-23 DIAGNOSIS — H409 Unspecified glaucoma: Secondary | ICD-10-CM | POA: Diagnosis not present

## 2013-06-30 ENCOUNTER — Telehealth: Payer: Self-pay | Admitting: Cardiology

## 2013-06-30 DIAGNOSIS — R0989 Other specified symptoms and signs involving the circulatory and respiratory systems: Secondary | ICD-10-CM

## 2013-06-30 DIAGNOSIS — J329 Chronic sinusitis, unspecified: Secondary | ICD-10-CM

## 2013-06-30 MED ORDER — AMOXICILLIN 500 MG PO TABS: 500.0000 mg | ORAL_TABLET | Freq: Three times a day (TID) | ORAL | Status: DC

## 2013-06-30 NOTE — Telephone Encounter (Signed)
New message  Patient has had lots of trouble with congestion in his chest, He would like an antibiotic. Please call patient and advise.

## 2013-06-30 NOTE — Telephone Encounter (Signed)
Advised patient. Per  Dr. Mare Ferrari will also get a chest xray

## 2013-06-30 NOTE — Telephone Encounter (Signed)
We can prescribe amoxicillin 500 mg 3 times a day #30

## 2013-06-30 NOTE — Telephone Encounter (Signed)
Will forward to  Dr. Brackbill for review 

## 2013-07-02 ENCOUNTER — Ambulatory Visit (INDEPENDENT_AMBULATORY_CARE_PROVIDER_SITE_OTHER)
Admission: RE | Admit: 2013-07-02 | Discharge: 2013-07-02 | Disposition: A | Discharge status: Home / Self Care | Payer: Medicare Other | Source: Ambulatory Visit | Attending: Cardiology | Admitting: Cardiology

## 2013-07-02 DIAGNOSIS — R0989 Other specified symptoms and signs involving the circulatory and respiratory systems: Secondary | ICD-10-CM

## 2013-07-05 ENCOUNTER — Telehealth: Payer: Self-pay | Admitting: Cardiology

## 2013-07-05 DIAGNOSIS — R9389 Abnormal findings on diagnostic imaging of other specified body structures: Secondary | ICD-10-CM

## 2013-07-05 NOTE — Telephone Encounter (Signed)
F/u:  Pt states he is still waiting on his x-ray results. Please call pt back

## 2013-07-05 NOTE — Telephone Encounter (Signed)
New message     Want xray results from friday

## 2013-07-05 NOTE — Telephone Encounter (Signed)
Advised patient

## 2013-07-05 NOTE — Telephone Encounter (Signed)
Message copied by Earvin Hansen on Mon Jul 05, 2013  6:00 PM ------      Message from: Darlin Coco      Created: Sun Jul 04, 2013  7:02 PM       Please report. Chest xray shows right basilar pneumonia or atelectasis.  Since he has had so many respiratory illnesses recently, refer to pulmonary for consult.  Continue current antibiotics for now. ------

## 2013-07-05 NOTE — Telephone Encounter (Signed)
Joya Gaskins and Wert are doctors he is familiar with.

## 2013-07-06 NOTE — Telephone Encounter (Signed)
07/07/13 AT 10 WITH DR Joya Gaskins

## 2013-07-06 NOTE — Telephone Encounter (Signed)
Advised patient of appointment

## 2013-07-07 ENCOUNTER — Encounter: Payer: Self-pay | Admitting: Critical Care Medicine

## 2013-07-07 ENCOUNTER — Other Ambulatory Visit: Payer: Medicare Other

## 2013-07-07 ENCOUNTER — Ambulatory Visit (INDEPENDENT_AMBULATORY_CARE_PROVIDER_SITE_OTHER): Payer: Medicare Other | Admitting: Critical Care Medicine

## 2013-07-07 VITALS — BP 130/86 | HR 81 | Temp 97.5°F | Ht 70.5 in | Wt 208.6 lb

## 2013-07-07 DIAGNOSIS — J329 Chronic sinusitis, unspecified: Secondary | ICD-10-CM | POA: Diagnosis not present

## 2013-07-07 DIAGNOSIS — R0609 Other forms of dyspnea: Secondary | ICD-10-CM | POA: Diagnosis not present

## 2013-07-07 DIAGNOSIS — J189 Pneumonia, unspecified organism: Secondary | ICD-10-CM

## 2013-07-07 DIAGNOSIS — R0600 Dyspnea, unspecified: Secondary | ICD-10-CM

## 2013-07-07 MED ORDER — BUDESONIDE 180 MCG/ACT IN AEPB: 2.0000 | INHALATION_SPRAY | Freq: Two times a day (BID) | RESPIRATORY_TRACT | Status: DC

## 2013-07-07 MED ORDER — PREDNISONE 10 MG PO TABS: ORAL_TABLET | ORAL | Status: DC

## 2013-07-07 NOTE — Progress Notes (Signed)
Subjective:    Patient ID: Shane [name], male    DOB: 1933-10-29, 79 y.o.   MRN: [id number]  Shortness of Breath This is a new problem. The current episode started more than 1 month ago. The problem occurs daily (exertional only). The problem has been gradually worsening. Associated symptoms include a fever, headaches, rhinorrhea, sputum production and wheezing. Pertinent negatives include no abdominal pain, chest pain, claudication, coryza, ear pain, hemoptysis, leg pain, leg swelling, neck pain, orthopnea, PND, rash, sore throat, swollen glands, syncope or vomiting. The symptoms are aggravated by lying flat, fumes, odors, weather changes, URIs and any activity. Associated symptoms comments: Coughs QHS. His past medical history is significant for allergies, CAD, a heart failure and pneumonia. There is no history of aspirin allergies, asthma, bronchiolitis, chronic lung disease, COPD, DVT or PE. (Allergy test Positive, immunotherapy for 70yrs and did not help)   This patient notes frequent sinus infections and had an onset in September 2014 with sinus pressure postnasal drainage and increased cough with wheezing and shortness of breath. The patient has been on 3 different cycles of antibiotics since September 2014. The patient does follow with ENT at [facility] for medical therapy of chronic sinusitis but has never had sinus surgery.  Short term smoker quit 55 yrs ago No real chest pain, just heaviness.    Past Medical History  Diagnosis Date  . Coronary artery disease     remote CABG in 1985, cath in 2003 by Dr. Lia Foyer with no PCI, last nuclear in 2010 showing scar and EF of 28%.   . Dyslipidemia   . Hypercholesterolemia   . Left ventricular dysfunction     28% per nuclear in 2010 and 35 to 40% per echo in 2010  . HTN (hypertension)   . Diabetes mellitus   . Cellulitis of arm, left     MSSA  . Spinal stenosis   . BPH (benign prostatic hyperplasia)   . Allergic rhinitis 01-08-13    Uses  nebulizer for chronic sinus issues and Mucinex.  . Abnormal nuclear cardiac imaging test Nov 2010    moderate area of infarct in the inferior wall with only minimal reversibility and EF of 28%  . Myocardial infarction     1985  . Pneumonia     hx of several times years ago   . Blood transfusion     ? at time of bypass surgery   . Arthritis     osteoarthritis   . Colon polyps     adenomatous  . Neuromuscular disorder     legs mild paralysis-able to walk"nerve damage"-legs- left leg brace   . CKD (chronic kidney disease), stage III     "was told due to meds he takes"-no Renal workup done  . Glaucoma 01-08-13    tx. eye drops     Family History  Problem Relation Age of Onset  . Heart attack Father   . Heart disease Father   . Colon polyps Father   . Diabetes Sister     x 2  . Heart disease Brother     x 2  . Lung cancer Mother   . Bone cancer Brother      History   Social History  . Marital Status: Married    Spouse Name: N/A    Number of Children: 2  . Years of Education: N/A   Occupational History  . pastor    Social History Main Topics  . Smoking status: Former Smoker --  1.00 packs/day for 5 years    Types: Cigarettes, Pipe, Cigars    Quit date: 08/05/1958  . Smokeless tobacco: Never Used  . Alcohol Use: No  . Drug Use: No  . Sexual Activity: Not Currently   Other Topics Concern  . Not on file   Social History Narrative  . No narrative on file     Allergies  Allergen Reactions  . Codeine     anxiety  . Hydrocodone     anxiety  . Azithromycin Rash     Outpatient Prescriptions Prior to Visit  Medication Sig Dispense Refill  . acetaminophen (TYLENOL) 500 MG tablet Take 500 mg by mouth as needed for pain.      Marland Kitchen amoxicillin (AMOXIL) 500 MG tablet Take 1 tablet (500 mg total) by mouth 3 (three) times daily.  30 tablet  0  . aspirin EC 81 MG tablet Take 81 mg by mouth every morning.      Marland Kitchen atorvastatin (LIPITOR) 10 MG tablet Take 1 tablet (10 mg  total) by mouth daily.  90 tablet  3  . budesonide (PULMICORT) 1 MG/2ML nebulizer solution Place 1 mg into both nostrils at bedtime.       . Cholecalciferol (VITAMIN D-3) 1000 UNITS CAPS Take 2,000 Units by mouth daily.      Marland Kitchen CIALIS 20 MG tablet as directed.      Marland Kitchen DIOVAN HCT 320-12.5 MG per tablet Take 1 tablet by mouth daily.  90 tablet  2  . diphenhydrAMINE (BENADRYL) 25 mg capsule Take 25 mg by mouth every 6 (six) hours as needed for allergies.       Marland Kitchen latanoprost (XALATAN) 0.005 % ophthalmic solution Place 1 drop into both eyes at bedtime.       . metFORMIN (GLUCOPHAGE) 500 MG tablet Take 1 tablet (500 mg total) by mouth daily.  90 tablet  3  . metoprolol succinate (TOPROL-XL) 50 MG 24 hr tablet Take 25 mg by mouth every morning. Take with or immediately following a meal.      . mometasone (NASONEX) 50 MCG/ACT nasal spray Place 2 sprays into the nose daily.  51 g  3  . Multiple Vitamin (MULTIVITAMIN) tablet Take 1 tablet by mouth daily.      . niacin (NIASPAN) 1000 MG CR tablet Take 1,000 mg by mouth every morning.       . NORVASC 10 MG tablet TAKE 1 TABLET DAILY  90 tablet  3  . spironolactone (ALDACTONE) 25 MG tablet Take 25 mg by mouth every other day.       . zolpidem (AMBIEN) 5 MG tablet Take 1-2 tablets (5-10 mg total) by mouth at bedtime as needed for sleep.  60 tablet  3  . amLODipine (NORVASC) 10 MG tablet Take 10 mg by mouth every morning.       No facility-administered medications prior to visit.      Review of Systems  Constitutional: Positive for fever, chills, activity change, appetite change and fatigue. Negative for diaphoresis and unexpected weight change.  HENT: Positive for congestion, postnasal drip, rhinorrhea, sinus pressure and sneezing. Negative for dental problem, ear discharge, ear pain, facial swelling, hearing loss, mouth sores, nosebleeds, sore throat, tinnitus, trouble swallowing and voice change.   Eyes: Positive for discharge and itching. Negative for  photophobia and visual disturbance.  Respiratory: Positive for cough, sputum production, chest tightness, shortness of breath and wheezing. Negative for apnea, hemoptysis, choking and stridor.   Cardiovascular: Negative for chest pain,  palpitations, orthopnea, claudication, leg swelling, syncope and PND.  Gastrointestinal: Negative for nausea, vomiting, abdominal pain, constipation, blood in stool and abdominal distention.  Genitourinary: Negative for dysuria, urgency, frequency, hematuria, flank pain, decreased urine volume and difficulty urinating.  Musculoskeletal: Negative for arthralgias, back pain, gait problem, joint swelling, myalgias, neck pain and neck stiffness.  Skin: Negative for color change, pallor and rash.  Neurological: Positive for headaches. Negative for dizziness, tremors, seizures, syncope, speech difficulty, weakness, light-headedness and numbness.  Hematological: Negative for adenopathy. Does not bruise/bleed easily.  Psychiatric/Behavioral: Negative for confusion, sleep disturbance and agitation. The patient is not nervous/anxious.        Objective:   Physical Exam Filed Vitals:   07/07/13 1012  BP: 130/86  Pulse: 81  Temp: 97.5 F (36.4 C)  TempSrc: Oral  Height: 5' 10.5" (1.791 m)  Weight: 208 lb 9.6 oz (94.62 kg)  SpO2: 94%    Gen: Pleasant, well-nourished, in no distress,  normal affect  ENT: No lesions,  mouth clear,  oropharynx clear,++ postnasal drip Mild rhinitis without purulence  Neck: No JVD, no TMG, no carotid bruits  Lungs: No use of accessory muscles, no dullness to percussion, clear without rales or rhonchi  Cardiovascular: RRR, heart sounds normal, no murmur or gallops, no peripheral edema  Abdomen: soft and NT, no HSM,  BS normal  Musculoskeletal: No deformities, no cyanosis or clubbing  Neuro: alert, non focal  Skin: Warm, no lesions or rashes  No results found.  Pulmonary function studies obtained on 07/07/2013 and  essentially show mild airway resistance increase in mild reduction in diffusion capacity with normal spirometry  Chest x-ray reveals infiltrate right lower lobe at diaphragm      Assessment & Plan:   Dyspnea and respiratory abnormality Chronic recurrent dyspnea with chronic cough and wheezing. Also evidence for right basilar infiltrate. Suspect this is a combination of lower airway inflammation and need to also rule out moderate bronchiectasis. Note pulmonary function studies obtained today reveal mild increase in airway resistance despite normal spirometry. Also note lung capacity is normal but diffusion capacity is mildly reduced.  I suspect this patient has an allergic component given elevated IgE level as well. This patient may well have lower airway inflammation and mucous plugging and again need to rule out bronchiectasis. The patient clearly has had repeated bouts of infection in the airway is in need to also rule out chronic sinusitis. Plan Start pulmicort two puff twice daily Prednisone 10mg  Take 4 tablets daily for 5 days then stop Finish amoxicillin Obtain CT scan of chest and sinus Obtain allergy profile   Updated Medication List Outpatient Encounter Prescriptions as of 07/07/2013  Medication Sig  . acetaminophen (TYLENOL) 500 MG tablet Take 500 mg by mouth as needed for pain.  Marland Kitchen amoxicillin (AMOXIL) 500 MG tablet Take 1 tablet (500 mg total) by mouth 3 (three) times daily.  Marland Kitchen aspirin EC 81 MG tablet Take 81 mg by mouth every morning.  Marland Kitchen atorvastatin (LIPITOR) 10 MG tablet Take 1 tablet (10 mg total) by mouth daily.  . budesonide (PULMICORT) 1 MG/2ML nebulizer solution Place 1 mg into both nostrils at bedtime.   . Cholecalciferol (VITAMIN D-3) 1000 UNITS CAPS Take 2,000 Units by mouth daily.  Marland Kitchen CIALIS 20 MG tablet as directed.  Marland Kitchen dextromethorphan-guaiFENesin (MUCINEX DM) 30-600 MG per 12 hr tablet Take 1 tablet by mouth 2 (two) times daily.  Marland Kitchen DIOVAN HCT 320-12.5 MG per  tablet Take 1 tablet by mouth daily.  Marland Kitchen  diphenhydrAMINE (BENADRYL) 25 mg capsule Take 25 mg by mouth every 6 (six) hours as needed for allergies.   . fluocinonide cream (LIDEX) 0.05 % as needed.  Marland Kitchen guaifenesin (WAL-TUSSIN) 100 MG/5ML syrup Take 200 mg by mouth as needed for cough.  . latanoprost (XALATAN) 0.005 % ophthalmic solution Place 1 drop into both eyes at bedtime.   . metFORMIN (GLUCOPHAGE) 500 MG tablet Take 1 tablet (500 mg total) by mouth daily.  . metoprolol succinate (TOPROL-XL) 50 MG 24 hr tablet Take 25 mg by mouth every morning. Take with or immediately following a meal.  . mometasone (NASONEX) 50 MCG/ACT nasal spray Place 2 sprays into the nose daily.  . Multiple Vitamin (MULTIVITAMIN) tablet Take 1 tablet by mouth daily.  . niacin (NIASPAN) 1000 MG CR tablet Take 1,000 mg by mouth every morning.   . NORVASC 10 MG tablet TAKE 1 TABLET DAILY  . spironolactone (ALDACTONE) 25 MG tablet Take 25 mg by mouth every other day.   . zolpidem (AMBIEN) 5 MG tablet Take 1-2 tablets (5-10 mg total) by mouth at bedtime as needed for sleep.  . [DISCONTINUED] amLODipine (NORVASC) 10 MG tablet Take 10 mg by mouth every morning.  . budesonide (PULMICORT FLEXHALER) 180 MCG/ACT inhaler Inhale 2 puffs into the lungs 2 (two) times daily.  . predniSONE (DELTASONE) 10 MG tablet Take 4 tablets daily for 5 days then stop

## 2013-07-07 NOTE — Patient Instructions (Signed)
CT Sinus and chest will be obtained Pulmonary function studies will be scheduled Allergy labs today Start pulmicort two puff twice daily Prednisone 10mg  Take 4 tablets daily for 5 days then stop Finish amoxicillin Return 1 month , I will call sooner with results

## 2013-07-07 NOTE — Progress Notes (Signed)
pft done. Jennifer Castillo, CMA  

## 2013-07-08 DIAGNOSIS — J4540 Moderate persistent asthma, uncomplicated: Secondary | ICD-10-CM | POA: Insufficient documentation

## 2013-07-08 LAB — ALLERGY FULL PROFILE
Alternaria Alternata: <0.1 kU/L
Bahia Grass: <0.1 kU/L
Box Elder IgE: <0.1 kU/L
Cat Dander: <0.1 kU/L
Curvularia lunata: <0.1 kU/L
Elm IgE: <0.1 kU/L
Fescue: <0.1 kU/L
G009 Red Top: <0.1 kU/L
Lamb's Quarters: <0.1 kU/L
Oak: <0.1 kU/L
Sycamore Tree: <0.1 kU/L
Timothy Grass: <0.1 kU/L

## 2013-07-08 NOTE — Assessment & Plan Note (Signed)
Chronic recurrent dyspnea with chronic cough and wheezing. Also evidence for right basilar infiltrate. Suspect this is a combination of lower airway inflammation and need to also rule out moderate bronchiectasis. Note pulmonary function studies obtained today reveal mild increase in airway resistance despite normal spirometry. Also note lung capacity is normal but diffusion capacity is mildly reduced.  I suspect this patient has an allergic component given elevated IgE level as well. This patient may well have lower airway inflammation and mucous plugging and again need to rule out bronchiectasis. The patient clearly has had repeated bouts of infection in the airway is in need to also rule out chronic sinusitis. Plan Start pulmicort two puff twice daily Prednisone 10mg  Take 4 tablets daily for 5 days then stop Finish amoxicillin Obtain CT scan of chest and sinus Obtain allergy profile

## 2013-07-09 DIAGNOSIS — E291 Testicular hypofunction: Secondary | ICD-10-CM | POA: Diagnosis not present

## 2013-07-10 ENCOUNTER — Other Ambulatory Visit: Payer: Self-pay | Admitting: Cardiology

## 2013-07-11 ENCOUNTER — Other Ambulatory Visit: Payer: Self-pay | Admitting: Cardiology

## 2013-07-13 ENCOUNTER — Ambulatory Visit (INDEPENDENT_AMBULATORY_CARE_PROVIDER_SITE_OTHER)
Admission: RE | Admit: 2013-07-13 | Discharge: 2013-07-13 | Disposition: A | Discharge status: Home / Self Care | Payer: Medicare Other | Source: Ambulatory Visit | Attending: Critical Care Medicine | Admitting: Critical Care Medicine

## 2013-07-13 DIAGNOSIS — J019 Acute sinusitis, unspecified: Secondary | ICD-10-CM | POA: Diagnosis not present

## 2013-07-13 DIAGNOSIS — J189 Pneumonia, unspecified organism: Secondary | ICD-10-CM

## 2013-07-13 DIAGNOSIS — J329 Chronic sinusitis, unspecified: Secondary | ICD-10-CM | POA: Diagnosis not present

## 2013-07-13 DIAGNOSIS — J984 Other disorders of lung: Secondary | ICD-10-CM | POA: Diagnosis not present

## 2013-07-14 ENCOUNTER — Ambulatory Visit (INDEPENDENT_AMBULATORY_CARE_PROVIDER_SITE_OTHER): Payer: Medicare Other | Admitting: Cardiology

## 2013-07-14 ENCOUNTER — Encounter: Payer: Self-pay | Admitting: Cardiology

## 2013-07-14 VITALS — BP 122/80 | HR 76 | Ht 70.0 in | Wt 211.8 lb

## 2013-07-14 DIAGNOSIS — I119 Hypertensive heart disease without heart failure: Secondary | ICD-10-CM

## 2013-07-14 DIAGNOSIS — R0609 Other forms of dyspnea: Secondary | ICD-10-CM | POA: Diagnosis not present

## 2013-07-14 DIAGNOSIS — I2510 Atherosclerotic heart disease of native coronary artery without angina pectoris: Secondary | ICD-10-CM

## 2013-07-14 DIAGNOSIS — N058 Unspecified nephritic syndrome with other morphologic changes: Secondary | ICD-10-CM

## 2013-07-14 DIAGNOSIS — I259 Chronic ischemic heart disease, unspecified: Secondary | ICD-10-CM | POA: Diagnosis not present

## 2013-07-14 DIAGNOSIS — E1129 Type 2 diabetes mellitus with other diabetic kidney complication: Secondary | ICD-10-CM

## 2013-07-14 NOTE — Progress Notes (Signed)
Shane [name] Date of Birth:  [birth year] [address] [name], [place]  [address] [phone]         Fax   [phone]  History of Present Illness: This pleasant 79 year old gentleman is seen for a scheduled followup office visit. He has a history of ischemic heart disease with ischemic cardiomyopathy. He had a nuclear stress test in 06/05/11 which showed no ischemia.  His last echocardiogram was in 2010 and showed an ejection fraction of 35-40%. . On 08/01/11 he underwent successful right total knee replacement. He did very well in the physical therapy following knee replacement.  The patient has had a recent problem with recurrent sinusitis and bronchitis.  He recently saw pulmonologist Dr. Lyda [name] and has been doing better on his new regimen.  He was given a short course of steroids which has helped him.   Current Outpatient Prescriptions  Medication Sig Dispense Refill  . acetaminophen (TYLENOL) 500 MG tablet Take 500 mg by mouth as needed for pain.      Marland Kitchen aspirin EC 81 MG tablet Take 81 mg by mouth every morning.      Marland Kitchen atorvastatin (LIPITOR) 10 MG tablet Take 1 tablet (10 mg total) by mouth daily.  90 tablet  3  . budesonide (PULMICORT FLEXHALER) 180 MCG/ACT inhaler Inhale 2 puffs into the lungs 2 (two) times daily.  1 each  6  . budesonide (PULMICORT) 1 MG/2ML nebulizer solution Place 1 mg into both nostrils at bedtime.       . Cholecalciferol (VITAMIN D-3) 1000 UNITS CAPS Take 2,000 Units by mouth daily.      Marland Kitchen CIALIS 20 MG tablet as directed.      Marland Kitchen dextromethorphan-guaiFENesin (MUCINEX DM) 30-600 MG per 12 hr tablet Take 1 tablet by mouth 2 (two) times daily.      Marland Kitchen DIOVAN HCT 320-12.5 MG per tablet TAKE 1 TABLET DAILY  90 tablet  0  . diphenhydrAMINE (BENADRYL) 25 mg capsule Take 25 mg by mouth every 6 (six) hours as needed for allergies.       . fluocinonide cream (LIDEX) 0.05 % as needed.      Marland Kitchen guaifenesin (WAL-TUSSIN) 100 MG/5ML syrup Take 200 mg by  mouth as needed for cough.      . latanoprost (XALATAN) 0.005 % ophthalmic solution Place 1 drop into both eyes at bedtime.       . metFORMIN (GLUCOPHAGE) 500 MG tablet Take 1 tablet (500 mg total) by mouth daily.  90 tablet  3  . metoprolol succinate (TOPROL-XL) 50 MG 24 hr tablet Take 25 mg by mouth every morning. Take with or immediately following a meal.      . mometasone (NASONEX) 50 MCG/ACT nasal spray Place 2 sprays into the nose daily.  51 g  3  . Multiple Vitamin (MULTIVITAMIN) tablet Take 1 tablet by mouth daily.      Marland Kitchen NIASPAN 1000 MG CR tablet TAKE 2 TABLETS AT BEDTIME  180 tablet  0  . NORVASC 10 MG tablet TAKE 1 TABLET DAILY  90 tablet  3  . spironolactone (ALDACTONE) 25 MG tablet Take 25 mg by mouth every other day.       . zolpidem (AMBIEN) 5 MG tablet Take 1-2 tablets (5-10 mg total) by mouth at bedtime as needed for sleep.  60 tablet  3   No current facility-administered medications for this visit.    Allergies  Allergen Reactions  . Codeine     anxiety  .  Hydrocodone     anxiety  . Azithromycin Rash    Patient Active Problem List   Diagnosis Date Noted  . Dyspnea and respiratory abnormality 07/08/2013  . CAD s/p coronary arthery bypass graft   . BPH (benign prostatic hyperplasia)   . Kidney disease, chronic, stage III (GFR 30-59 ml/min)   . H/O Spinal stenosis   . DM (diabetes mellitus), type 2 with renal complications [id number]  . Benign hypertensive heart disease without heart failure 11/07/2010  . Allergic rhinitis 11/07/2010  . Osteoarthritis 11/07/2010  . Ischemic heart disease   . Dyslipidemia   . Hypercholesterolemia     History  Smoking status  . Former Smoker -- 1.00 packs/day for 5 years  . Types: Cigarettes, Pipe, Cigars  . Quit date: 08/05/1958  Smokeless tobacco  . Never Used    History  Alcohol Use No    Family History  Problem Relation Age of Onset  . Heart attack Father   . Heart disease Father   . Colon polyps Father   .  Diabetes Sister     x 2  . Heart disease Brother     x 2  . Lung cancer Mother   . Bone cancer Brother     Review of Systems: Constitutional: no fever chills diaphoresis or fatigue or change in weight.  Head and neck: no hearing loss, no epistaxis, no photophobia or visual disturbance. Respiratory: No cough, shortness of breath or wheezing. Cardiovascular: No chest pain peripheral edema, palpitations. Gastrointestinal: No abdominal distention, no abdominal pain, no change in bowel habits hematochezia or melena. Genitourinary: No dysuria, no frequency, no urgency, no nocturia. Musculoskeletal:No arthralgias, no back pain, no gait disturbance or myalgias. Neurological: No dizziness, no headaches, no numbness, no seizures, no syncope, no weakness, no tremors. Hematologic: No lymphadenopathy, no easy bruising. Psychiatric: No confusion, no hallucinations, no sleep disturbance.    Physical Exam: Filed Vitals:   07/14/13 1446  BP: 122/80  Pulse: 76   the general appearance reveals a well-developed well-nourished gentleman in no distress.The head and neck exam reveals pupils equal and reactive.  Extraocular movements are full.  There is no scleral icterus.  The mouth and pharynx are normal.  The neck is supple.  The carotids reveal no bruits.  The jugular venous pressure is normal.  The  thyroid is not enlarged.  There is no lymphadenopathy.  The chest is clear to percussion and auscultation.  There are no rales or rhonchi.  Expansion of the chest is symmetrical.  The precordium is quiet.  The first heart sound is normal.  The second heart sound is physiologically split.  There is no murmur gallop rub or click.  There is no abnormal lift or heave.  The abdomen is soft and nontender.  The bowel sounds are normal.  The liver and spleen are not enlarged.  There are no abdominal masses.  There are no abdominal bruits.  Extremities reveal good pedal pulses.  There is no phlebitis or edema.  There is  no cyanosis or clubbing.  Strength is normal and symmetrical in all extremities.  He wears a brace on his left ankle. There is no lateralizing weakness.  There are no sensory deficits.  The skin is warm and dry.  There is no rash.     Assessment / Plan: The patient is to continue same medication.  We will get a 2-D echo.  He'll return in 4 months for office visit EKG and fasting lipid panel hepatic function  panel and basal metabolic panel

## 2013-07-14 NOTE — Assessment & Plan Note (Signed)
The patient has not been experiencing any hypoglycemic episodes.

## 2013-07-14 NOTE — Assessment & Plan Note (Signed)
The patient has not been experiencing any chest pain or angina. 

## 2013-07-14 NOTE — Assessment & Plan Note (Signed)
The patient's blood pressure has been stable on current therapy.  He has a past history of left ventricular systolic dysfunction.  We'll update his two-dimensional echocardiogram.  He has had some exertional dyspnea which is multifactorial including cardiac and pulmonary.

## 2013-07-14 NOTE — Patient Instructions (Signed)
Your physician has requested that you have an echocardiogram. Echocardiography is a painless test that uses sound waves to create images of your heart. It provides your doctor with information about the size and shape of your heart and how well your heart's chambers and valves are working. This procedure takes approximately one hour. There are no restrictions for this procedure.  Your physician wants you to follow-up in: 4 months with fasting labs (lp/bmet/hfp) and ekg You will receive a reminder letter in the mail two months in advance. If you don't receive a letter, please call our office to schedule the follow-up appointment.   Your physician recommends that you continue on your current medications as directed. Please refer to the Current Medication list given to you today.

## 2013-07-15 ENCOUNTER — Telehealth: Payer: Self-pay | Admitting: *Deleted

## 2013-07-15 MED ORDER — AMOXICILLIN-POT CLAVULANATE 875-125 MG PO TABS: 1.0000 | ORAL_TABLET | Freq: Two times a day (BID) | ORAL | Status: DC

## 2013-07-15 MED ORDER — PREDNISONE 10 MG PO TABS: ORAL_TABLET | ORAL | Status: DC

## 2013-07-15 NOTE — Progress Notes (Signed)
Quick Note:  Rxs sent to [facility] through phone msg. Pt aware. ______

## 2013-07-15 NOTE — Telephone Encounter (Signed)
Per msg from Dr. Joya Gaskins:  Result Note     Pt aware. pls call in more prednisone 10mg  Take 4 for three days 3 for three days 2 for three days 1 for three days and stop #30    Call in augmentin 875 bid x 14 days    -----  Called, spoke with pt.  He would like rxs to be sent to [facility] on [address].  Rxs sent.  Pt aware.

## 2013-07-15 NOTE — Telephone Encounter (Signed)
Rx for augmentin [facility]  I have called and cancelled this and [facility] sent rx to correct [facility]

## 2013-07-16 ENCOUNTER — Telehealth: Payer: Self-pay | Admitting: Critical Care Medicine

## 2013-07-16 DIAGNOSIS — R0600 Dyspnea, unspecified: Secondary | ICD-10-CM

## 2013-07-16 DIAGNOSIS — J329 Chronic sinusitis, unspecified: Secondary | ICD-10-CM | POA: Insufficient documentation

## 2013-07-16 NOTE — Telephone Encounter (Signed)
i spoke to the pt and gave him all of his test results

## 2013-07-23 ENCOUNTER — Encounter: Payer: Self-pay | Admitting: Cardiology

## 2013-07-23 ENCOUNTER — Ambulatory Visit (HOSPITAL_COMMUNITY): Payer: Medicare Other | Attending: Cardiology | Admitting: Radiology

## 2013-07-23 DIAGNOSIS — R0609 Other forms of dyspnea: Secondary | ICD-10-CM | POA: Diagnosis not present

## 2013-07-23 DIAGNOSIS — I252 Old myocardial infarction: Secondary | ICD-10-CM | POA: Insufficient documentation

## 2013-07-23 DIAGNOSIS — E1129 Type 2 diabetes mellitus with other diabetic kidney complication: Secondary | ICD-10-CM

## 2013-07-23 DIAGNOSIS — I059 Rheumatic mitral valve disease, unspecified: Secondary | ICD-10-CM | POA: Insufficient documentation

## 2013-07-23 DIAGNOSIS — I2510 Atherosclerotic heart disease of native coronary artery without angina pectoris: Secondary | ICD-10-CM

## 2013-07-23 DIAGNOSIS — E785 Hyperlipidemia, unspecified: Secondary | ICD-10-CM

## 2013-07-23 DIAGNOSIS — I428 Other cardiomyopathies: Secondary | ICD-10-CM | POA: Diagnosis not present

## 2013-07-23 DIAGNOSIS — I259 Chronic ischemic heart disease, unspecified: Secondary | ICD-10-CM

## 2013-07-23 DIAGNOSIS — I359 Nonrheumatic aortic valve disorder, unspecified: Secondary | ICD-10-CM | POA: Insufficient documentation

## 2013-07-23 NOTE — Progress Notes (Signed)
Echocardiogram performed by [facility]

## 2013-07-27 ENCOUNTER — Telehealth: Payer: Self-pay | Admitting: *Deleted

## 2013-07-27 NOTE — Telephone Encounter (Signed)
Message copied by Earvin Hansen on Tue Jul 27, 2013  9:20 AM ------      Message from: Darlin Coco      Created: Fri Jul 23, 2013  9:16 PM       Please report.  The LV function has improved since last echo (was 35-40, now is 45%)    C[facility] ------

## 2013-07-27 NOTE — Telephone Encounter (Signed)
Advised patient of lab results  

## 2013-08-04 DIAGNOSIS — E291 Testicular hypofunction: Secondary | ICD-10-CM | POA: Diagnosis not present

## 2013-08-09 ENCOUNTER — Other Ambulatory Visit: Payer: Self-pay

## 2013-08-09 ENCOUNTER — Encounter: Payer: Self-pay | Admitting: Critical Care Medicine

## 2013-08-09 ENCOUNTER — Other Ambulatory Visit: Payer: Self-pay | Admitting: Cardiology

## 2013-08-09 ENCOUNTER — Ambulatory Visit (INDEPENDENT_AMBULATORY_CARE_PROVIDER_SITE_OTHER): Payer: Medicare Other | Admitting: Critical Care Medicine

## 2013-08-09 VITALS — BP 158/90 | HR 83 | Temp 97.9°F | Ht 70.0 in | Wt 209.4 lb

## 2013-08-09 DIAGNOSIS — J45909 Unspecified asthma, uncomplicated: Secondary | ICD-10-CM

## 2013-08-09 DIAGNOSIS — J4540 Moderate persistent asthma, uncomplicated: Secondary | ICD-10-CM

## 2013-08-09 MED ORDER — AMOXICILLIN-POT CLAVULANATE 875-125 MG PO TABS: 1.0000 | ORAL_TABLET | Freq: Two times a day (BID) | ORAL | Status: DC

## 2013-08-09 MED ORDER — DIOVAN HCT 320-12.5 MG PO TABS: ORAL_TABLET | ORAL | Status: DC

## 2013-08-09 MED ORDER — AMOXICILLIN 500 MG PO CAPS: ORAL_CAPSULE | ORAL | Status: DC

## 2013-08-09 NOTE — Progress Notes (Signed)
Subjective:    Patient ID: Shane [name], male    DOB: 22-Aug-1933, 80 y.o.   MRN: [id number]  HPI This patient notes frequent sinus infections and had an onset in September 2014 with sinus pressure postnasal drainage and increased cough with wheezing and shortness of breath. The patient has been on 3 different cycles of antibiotics since September 2014. The patient does follow with ENT at [facility] for medical therapy of chronic sinusitis but has never had sinus surgery.  Short term smoker quit 55 yrs ago No real chest pain, just heaviness.    08/09/2013 Chief Complaint  Patient presents with  . 1 month follow up    c/o runny nose, sneezing, increased SOB, cough with mostly clear mucus, a little wheezing, and night sweats last night.  Symptoms started on Dec 28.     Pt got better than worse. Now with recurrent cough, sinus congestion, pndrip, sinus pressure, dyspnea. Pt is on nasal steroid and pulmicort flexihaler  IgE level high 209   Past Medical History  Diagnosis Date  . Coronary artery disease     remote CABG in 1985, cath in 2003 by Dr. Lia Foyer with no PCI, last nuclear in 2010 showing scar and EF of 28%.   . Dyslipidemia   . Hypercholesterolemia   . Left ventricular dysfunction     28% per nuclear in 2010 and 35 to 40% per echo in 2010  . HTN (hypertension)   . Diabetes mellitus   . Cellulitis of arm, left     MSSA  . Spinal stenosis   . BPH (benign prostatic hyperplasia)   . Allergic rhinitis 01-08-13    Uses nebulizer for chronic sinus issues and Mucinex.  . Abnormal nuclear cardiac imaging test Nov 2010    moderate area of infarct in the inferior wall with only minimal reversibility and EF of 28%  . Myocardial infarction     1985  . Pneumonia     hx of several times years ago   . Blood transfusion     ? at time of bypass surgery   . Arthritis     osteoarthritis   . Colon polyps     adenomatous  . Neuromuscular disorder     legs mild paralysis-able to walk"nerve  damage"-legs- left leg brace   . CKD (chronic kidney disease), stage III     "was told due to meds he takes"-no Renal workup done  . Glaucoma 01-08-13    tx. eye drops     Family History  Problem Relation Age of Onset  . Heart attack Father   . Heart disease Father   . Colon polyps Father   . Diabetes Sister     x 2  . Heart disease Brother     x 2  . Lung cancer Mother   . Bone cancer Brother      History   Social History  . Marital Status: Married    Spouse Name: N/A    Number of Children: 2  . Years of Education: N/A   Occupational History  . pastor    Social History Main Topics  . Smoking status: Former Smoker -- 1.00 packs/day for 5 years    Types: Cigarettes, Pipe, Cigars    Quit date: 08/05/1958  . Smokeless tobacco: Never Used  . Alcohol Use: No  . Drug Use: No  . Sexual Activity: Not Currently   Other Topics Concern  . Not on file   Social History  Narrative  . No narrative on file     Allergies  Allergen Reactions  . Codeine     anxiety  . Hydrocodone     anxiety  . Azithromycin Rash     Outpatient Prescriptions Prior to Visit  Medication Sig Dispense Refill  . acetaminophen (TYLENOL) 500 MG tablet Take 500 mg by mouth as needed for pain.      Marland Kitchen aspirin EC 81 MG tablet Take 81 mg by mouth every morning.      Marland Kitchen atorvastatin (LIPITOR) 10 MG tablet Take 1 tablet (10 mg total) by mouth daily.  90 tablet  3  . budesonide (PULMICORT FLEXHALER) 180 MCG/ACT inhaler Inhale 2 puffs into the lungs 2 (two) times daily.  1 each  6  . budesonide (PULMICORT) 1 MG/2ML nebulizer solution Place 1 mg into both nostrils at bedtime.       . Cholecalciferol (VITAMIN D-3) 1000 UNITS CAPS Take 2,000 Units by mouth daily.      Marland Kitchen CIALIS 20 MG tablet as directed.      Marland Kitchen dextromethorphan-guaiFENesin (MUCINEX DM) 30-600 MG per 12 hr tablet Take 1 tablet by mouth 2 (two) times daily.      . diphenhydrAMINE (BENADRYL) 25 mg capsule Take 25 mg by mouth every 6 (six) hours as  needed for allergies.       . fluocinonide cream (LIDEX) 0.05 % as needed.      Marland Kitchen guaifenesin (WAL-TUSSIN) 100 MG/5ML syrup Take 200 mg by mouth as needed for cough.      . latanoprost (XALATAN) 0.005 % ophthalmic solution Place 1 drop into both eyes at bedtime.       . metFORMIN (GLUCOPHAGE) 500 MG tablet Take 1 tablet (500 mg total) by mouth daily.  90 tablet  3  . metoprolol succinate (TOPROL-XL) 50 MG 24 hr tablet Take 25 mg by mouth every morning. Take with or immediately following a meal.      . mometasone (NASONEX) 50 MCG/ACT nasal spray Place 2 sprays into the nose daily.  51 g  3  . Multiple Vitamin (MULTIVITAMIN) tablet Take 1 tablet by mouth daily.      Marland Kitchen NIASPAN 1000 MG CR tablet TAKE 2 TABLETS AT BEDTIME  180 tablet  0  . NORVASC 10 MG tablet TAKE 1 TABLET DAILY  90 tablet  3  . spironolactone (ALDACTONE) 25 MG tablet Take 25 mg by mouth every other day.       . zolpidem (AMBIEN) 5 MG tablet Take 1-2 tablets (5-10 mg total) by mouth at bedtime as needed for sleep.  60 tablet  3  . amoxicillin-clavulanate (AUGMENTIN) 875-125 MG per tablet Take 1 tablet by mouth 2 (two) times daily.  28 tablet  0  . predniSONE (DELTASONE) 10 MG tablet Take 4 for three days 3 for three days 2 for three days 1 for three days and stop  30 tablet  0   No facility-administered medications prior to visit.      Review of Systems  Constitutional: Positive for chills, activity change, appetite change and fatigue. Negative for diaphoresis and unexpected weight change.  HENT: Positive for congestion, postnasal drip, sinus pressure and sneezing. Negative for dental problem, ear discharge, facial swelling, hearing loss, mouth sores, nosebleeds, tinnitus, trouble swallowing and voice change.   Eyes: Positive for discharge and itching. Negative for photophobia and visual disturbance.  Respiratory: Positive for cough and chest tightness. Negative for apnea, choking and stridor.   Cardiovascular: Negative for  palpitations.  Gastrointestinal: Negative for nausea, constipation, blood in stool and abdominal distention.  Genitourinary: Negative for dysuria, urgency, frequency, hematuria, flank pain, decreased urine volume and difficulty urinating.  Musculoskeletal: Negative for arthralgias, back pain, gait problem, joint swelling, myalgias and neck stiffness.  Skin: Negative for color change and pallor.  Neurological: Negative for dizziness, tremors, seizures, syncope, speech difficulty, weakness, light-headedness and numbness.  Hematological: Negative for adenopathy. Does not bruise/bleed easily.  Psychiatric/Behavioral: Negative for confusion, sleep disturbance and agitation. The patient is not nervous/anxious.        Objective:   Physical Exam  Filed Vitals:   08/09/13 1420  BP: 158/90  Pulse: 83  Temp: 97.9 F (36.6 C)  TempSrc: Oral  Height: 5\' 10"  (1.778 m)  Weight: 209 lb 6.4 oz (94.983 kg)  SpO2: 95%    Gen: Pleasant, well-nourished, in no distress,  normal affect  ENT: No lesions,  mouth clear,  oropharynx clear,++ postnasal drip Mild rhinitis with purulence R>L nare  Neck: No JVD, no TMG, no carotid bruits  Lungs: No use of accessory muscles, no dullness to percussion, clear without rales or rhonchi  Cardiovascular: RRR, heart sounds normal, no murmur or gallops, no peripheral edema  Abdomen: soft and NT, no HSM,  BS normal  Musculoskeletal: No deformities, no cyanosis or clubbing  Neuro: alert, non focal  Skin: Warm, no lesions or rashes  No results found.  Pulmonary function studies obtained on 07/07/2013 and essentially show mild airway resistance increase in mild reduction in diffusion capacity with normal spirometry   CT Chest : no infiltrate CT sinus : diffuse chronic sinus disease     Assessment & Plan:   Asthma, moderate persistent Moderate persistent asthma with flare d/t recurrent sinus infection d/t severe atopic allergic rhinitis Plan Take  augmentin one twice a day for 10days Use Neti pot for sinuses at least twice daily for 1 week then as needed All other medications the same Assess for Xolair Return 2 months     Updated Medication List Outpatient Encounter Prescriptions as of 08/09/2013  Medication Sig  . acetaminophen (TYLENOL) 500 MG tablet Take 500 mg by mouth as needed for pain.  Marland Kitchen aspirin EC 81 MG tablet Take 81 mg by mouth every morning.  Marland Kitchen atorvastatin (LIPITOR) 10 MG tablet Take 1 tablet (10 mg total) by mouth daily.  . budesonide (PULMICORT FLEXHALER) 180 MCG/ACT inhaler Inhale 2 puffs into the lungs 2 (two) times daily.  . budesonide (PULMICORT) 1 MG/2ML nebulizer solution Place 1 mg into both nostrils at bedtime.   . Cholecalciferol (VITAMIN D-3) 1000 UNITS CAPS Take 2,000 Units by mouth daily.  Marland Kitchen CIALIS 20 MG tablet as directed.  Marland Kitchen dextromethorphan-guaiFENesin (MUCINEX DM) 30-600 MG per 12 hr tablet Take 1 tablet by mouth 2 (two) times daily.  Marland Kitchen DIOVAN HCT 320-12.5 MG per tablet TAKE 1 TABLET DAILY/  patient may have generic  . diphenhydrAMINE (BENADRYL) 25 mg capsule Take 25 mg by mouth every 6 (six) hours as needed for allergies.   . fluocinonide cream (LIDEX) 0.05 % as needed.  Marland Kitchen guaifenesin (WAL-TUSSIN) 100 MG/5ML syrup Take 200 mg by mouth as needed for cough.  . latanoprost (XALATAN) 0.005 % ophthalmic solution Place 1 drop into both eyes at bedtime.   . metFORMIN (GLUCOPHAGE) 500 MG tablet Take 1 tablet (500 mg total) by mouth daily.  . metoprolol succinate (TOPROL-XL) 50 MG 24 hr tablet Take 25 mg by mouth every morning. Take with or immediately following a meal.  .  mometasone (NASONEX) 50 MCG/ACT nasal spray Place 2 sprays into the nose daily.  . Multiple Vitamin (MULTIVITAMIN) tablet Take 1 tablet by mouth daily.  Marland Kitchen NIASPAN 1000 MG CR tablet TAKE 2 TABLETS AT BEDTIME  . NORVASC 10 MG tablet TAKE 1 TABLET DAILY  . spironolactone (ALDACTONE) 25 MG tablet Take 25 mg by mouth every other day.   .  zolpidem (AMBIEN) 5 MG tablet Take 1-2 tablets (5-10 mg total) by mouth at bedtime as needed for sleep.  Marland Kitchen amoxicillin (AMOXIL) 500 MG capsule Take as directed prior to dental procedure  . amoxicillin-clavulanate (AUGMENTIN) 875-125 MG per tablet Take 1 tablet by mouth 2 (two) times daily.  . [DISCONTINUED] amoxicillin-clavulanate (AUGMENTIN) 875-125 MG per tablet Take 1 tablet by mouth 2 (two) times daily.  . [DISCONTINUED] predniSONE (DELTASONE) 10 MG tablet Take 4 for three days 3 for three days 2 for three days 1 for three days and stop

## 2013-08-09 NOTE — Patient Instructions (Signed)
Take augmentin one twice a day for 10days Use Neti pot for sinuses at least twice daily for 1 week then as needed All other medications the same We will check into Xolair for you, will have you sign release for insurance check Return 2 months

## 2013-08-09 NOTE — Assessment & Plan Note (Signed)
Moderate persistent asthma with flare d/t recurrent sinus infection d/t severe atopic allergic rhinitis Plan Take augmentin one twice a day for 10days Use Neti pot for sinuses at least twice daily for 1 week then as needed All other medications the same Assess for Xolair Return 2 months

## 2013-08-25 DIAGNOSIS — E291 Testicular hypofunction: Secondary | ICD-10-CM | POA: Diagnosis not present

## 2013-08-26 ENCOUNTER — Telehealth: Payer: Self-pay | Admitting: Critical Care Medicine

## 2013-08-26 MED ORDER — EPINEPHRINE 0.3 MG/0.3ML IJ SOAJ: 0.3000 mg | Freq: Once | INTRAMUSCULAR | Status: DC

## 2013-08-26 NOTE — Telephone Encounter (Signed)
I called and spoke with pt. He reports he received a call from [employer]) and was advised to call us to schedule an appt. From his understanding he was approved for this medication. Crystal have you received anything on this? thanks

## 2013-08-26 NOTE — Telephone Encounter (Signed)
I have contacted the pt to let him know that we received a fax from [facility] letting us know that we will have to buy and bill for his Xolair. Advised him that we will order his medication from the [facility] and we will contact him once we receive it in the office to schedule his first injection. He is aware that he will need to stay 2 hours after the injection for observation and to bring an EpiPen with him as well. Rx for EpiPen has been sent to his preferred pharmacy.  I will keep this message open to document when his medication comes in.

## 2013-08-26 NOTE — Telephone Encounter (Signed)
Ria Comment has received forms from [employer] regarding this.   I will forward this msg to her to f/u on per her request. Thank you Ria Comment.

## 2013-08-30 ENCOUNTER — Encounter: Payer: Self-pay | Admitting: Critical Care Medicine

## 2013-08-31 ENCOUNTER — Ambulatory Visit (INDEPENDENT_AMBULATORY_CARE_PROVIDER_SITE_OTHER): Payer: Medicare Other

## 2013-08-31 DIAGNOSIS — J45909 Unspecified asthma, uncomplicated: Secondary | ICD-10-CM | POA: Diagnosis not present

## 2013-08-31 NOTE — Telephone Encounter (Signed)
We have received the pt's xolair today. He is aware and will be coming to get his first injection today at 3pm. Advised to make sure to bring his Epipen.

## 2013-09-01 MED ORDER — OMALIZUMAB 150 MG ~~LOC~~ SOLR
300.0000 mg | Freq: Once | SUBCUTANEOUS | Status: AC
  Administered 2013-09-01: 300 mg via SUBCUTANEOUS

## 2013-09-14 ENCOUNTER — Ambulatory Visit (INDEPENDENT_AMBULATORY_CARE_PROVIDER_SITE_OTHER): Payer: Medicare Other

## 2013-09-14 DIAGNOSIS — J45909 Unspecified asthma, uncomplicated: Secondary | ICD-10-CM

## 2013-09-15 DIAGNOSIS — E291 Testicular hypofunction: Secondary | ICD-10-CM | POA: Diagnosis not present

## 2013-09-15 MED ORDER — OMALIZUMAB 150 MG ~~LOC~~ SOLR
300.0000 mg | Freq: Once | SUBCUTANEOUS | Status: AC
  Administered 2013-09-15: 300 mg via SUBCUTANEOUS

## 2013-09-28 ENCOUNTER — Ambulatory Visit: Payer: Medicare Other

## 2013-09-29 ENCOUNTER — Ambulatory Visit (INDEPENDENT_AMBULATORY_CARE_PROVIDER_SITE_OTHER): Payer: Medicare Other

## 2013-09-29 DIAGNOSIS — J45909 Unspecified asthma, uncomplicated: Secondary | ICD-10-CM | POA: Diagnosis not present

## 2013-10-01 MED ORDER — OMALIZUMAB 150 MG ~~LOC~~ SOLR
300.0000 mg | Freq: Once | SUBCUTANEOUS | Status: AC
  Administered 2013-10-01: 300 mg via SUBCUTANEOUS

## 2013-10-04 ENCOUNTER — Other Ambulatory Visit: Payer: Self-pay | Admitting: *Deleted

## 2013-10-04 MED ORDER — ZOLPIDEM TARTRATE 10 MG PO TABS: ORAL_TABLET | ORAL | Status: DC

## 2013-10-04 NOTE — Telephone Encounter (Signed)
Patient needs PA if he takes 2 Ambien will change from 5 mg to 10 mg

## 2013-10-11 ENCOUNTER — Other Ambulatory Visit: Payer: Self-pay | Admitting: Cardiology

## 2013-10-11 DIAGNOSIS — J309 Allergic rhinitis, unspecified: Secondary | ICD-10-CM

## 2013-10-13 ENCOUNTER — Ambulatory Visit (INDEPENDENT_AMBULATORY_CARE_PROVIDER_SITE_OTHER): Payer: Medicare Other

## 2013-10-13 DIAGNOSIS — E291 Testicular hypofunction: Secondary | ICD-10-CM | POA: Diagnosis not present

## 2013-10-13 DIAGNOSIS — J45909 Unspecified asthma, uncomplicated: Secondary | ICD-10-CM | POA: Diagnosis not present

## 2013-10-14 MED ORDER — OMALIZUMAB 150 MG ~~LOC~~ SOLR
300.0000 mg | Freq: Once | SUBCUTANEOUS | Status: AC
  Administered 2013-10-14: 300 mg via SUBCUTANEOUS

## 2013-10-18 LAB — PULMONARY FUNCTION TEST
DL/VA % pred: 78 %
DL/VA: 3.62 ml/min/mmHg/L
DLCO unc % pred: 64 %
DLCO unc: 21.4 ml/min/mmHg
FEF 25-75 Post: 2.3 L/sec
FEF 25-75 Pre: 2.1 L/sec
FEF2575-%Change-Post: 9 %
FEF2575-%Pred-Post: 107 %
FEF2575-%Pred-Pre: 98 %
FEV1-%Change-Post: 3 %
FEV1-%Pred-Post: 98 %
FEV1-%Pred-Pre: 94 %
FEV1-Post: 2.96 L
FEV1-Pre: 2.85 L
FEV1FVC-%Change-Post: 6 %
FEV1FVC-%Pred-Pre: 103 %
FEV6-%Change-Post: 0 %
FEV6-%Pred-Post: 95 %
FEV6-%Pred-Pre: 96 %
FEV6-Post: 3.75 L
FEV6-Pre: 3.79 L
FEV6FVC-%Change-Post: 1 %
FEV6FVC-%Pred-Post: 106 %
FEV6FVC-%Pred-Pre: 104 %
FVC-%Change-Post: -2 %
FVC-%Pred-Post: 89 %
FVC-%Pred-Pre: 91 %
FVC-Post: 3.75 L
Post FEV1/FVC ratio: 79 %
Post FEV6/FVC ratio: 100 %
Pre FEV1/FVC ratio: 74 %
Pre FEV6/FVC Ratio: 99 %
RV % pred: 81 %
RV: 2.14 L
TLC % pred: 83 %
TLC: 5.94 L

## 2013-10-19 DIAGNOSIS — D235 Other benign neoplasm of skin of trunk: Secondary | ICD-10-CM | POA: Diagnosis not present

## 2013-10-25 DIAGNOSIS — N400 Enlarged prostate without lower urinary tract symptoms: Secondary | ICD-10-CM | POA: Diagnosis not present

## 2013-10-25 DIAGNOSIS — E291 Testicular hypofunction: Secondary | ICD-10-CM | POA: Diagnosis not present

## 2013-10-27 ENCOUNTER — Ambulatory Visit (INDEPENDENT_AMBULATORY_CARE_PROVIDER_SITE_OTHER): Payer: Medicare Other

## 2013-10-27 DIAGNOSIS — J45909 Unspecified asthma, uncomplicated: Secondary | ICD-10-CM | POA: Diagnosis not present

## 2013-10-27 DIAGNOSIS — J4540 Moderate persistent asthma, uncomplicated: Secondary | ICD-10-CM

## 2013-11-01 MED ORDER — OMALIZUMAB 150 MG ~~LOC~~ SOLR
300.0000 mg | Freq: Once | SUBCUTANEOUS | Status: AC
  Administered 2013-11-01: 300 mg via SUBCUTANEOUS

## 2013-11-03 DIAGNOSIS — E291 Testicular hypofunction: Secondary | ICD-10-CM | POA: Diagnosis not present

## 2013-11-09 DIAGNOSIS — J3089 Other allergic rhinitis: Secondary | ICD-10-CM | POA: Diagnosis not present

## 2013-11-09 DIAGNOSIS — J328 Other chronic sinusitis: Secondary | ICD-10-CM | POA: Diagnosis not present

## 2013-11-10 ENCOUNTER — Ambulatory Visit (INDEPENDENT_AMBULATORY_CARE_PROVIDER_SITE_OTHER): Payer: Medicare Other

## 2013-11-10 DIAGNOSIS — J45909 Unspecified asthma, uncomplicated: Secondary | ICD-10-CM

## 2013-11-11 MED ORDER — OMALIZUMAB 150 MG ~~LOC~~ SOLR
300.0000 mg | Freq: Once | SUBCUTANEOUS | Status: AC
Start: 2013-11-11 — End: 2013-11-11
  Administered 2013-11-11: 300 mg via SUBCUTANEOUS

## 2013-11-12 ENCOUNTER — Other Ambulatory Visit: Payer: Self-pay

## 2013-11-12 DIAGNOSIS — G4700 Insomnia, unspecified: Secondary | ICD-10-CM

## 2013-11-12 MED ORDER — ZOLPIDEM TARTRATE 10 MG PO TABS: ORAL_TABLET | ORAL | Status: DC

## 2013-11-12 NOTE — Telephone Encounter (Signed)
Okay to refill ambien 

## 2013-11-17 DIAGNOSIS — E291 Testicular hypofunction: Secondary | ICD-10-CM | POA: Diagnosis not present

## 2013-11-24 ENCOUNTER — Ambulatory Visit (INDEPENDENT_AMBULATORY_CARE_PROVIDER_SITE_OTHER): Payer: Medicare Other

## 2013-11-24 ENCOUNTER — Ambulatory Visit: Payer: Medicare Other

## 2013-11-24 DIAGNOSIS — J45909 Unspecified asthma, uncomplicated: Secondary | ICD-10-CM | POA: Diagnosis not present

## 2013-11-25 ENCOUNTER — Ambulatory Visit: Payer: Medicare Other

## 2013-11-26 MED ORDER — OMALIZUMAB 150 MG ~~LOC~~ SOLR
300.0000 mg | Freq: Once | SUBCUTANEOUS | Status: AC
  Administered 2013-11-26: 300 mg via SUBCUTANEOUS

## 2013-12-01 DIAGNOSIS — E291 Testicular hypofunction: Secondary | ICD-10-CM | POA: Diagnosis not present

## 2013-12-07 ENCOUNTER — Other Ambulatory Visit: Payer: Self-pay | Admitting: Cardiology

## 2013-12-08 ENCOUNTER — Ambulatory Visit: Payer: Medicare Other

## 2013-12-08 ENCOUNTER — Ambulatory Visit (INDEPENDENT_AMBULATORY_CARE_PROVIDER_SITE_OTHER): Payer: Medicare Other

## 2013-12-08 ENCOUNTER — Other Ambulatory Visit: Payer: Self-pay | Admitting: *Deleted

## 2013-12-08 DIAGNOSIS — J45909 Unspecified asthma, uncomplicated: Secondary | ICD-10-CM

## 2013-12-10 MED ORDER — OMALIZUMAB 150 MG ~~LOC~~ SOLR
300.0000 mg | Freq: Once | SUBCUTANEOUS | Status: AC
  Administered 2013-12-10: 300 mg via SUBCUTANEOUS

## 2013-12-15 ENCOUNTER — Other Ambulatory Visit: Payer: Self-pay | Admitting: Cardiology

## 2013-12-17 ENCOUNTER — Other Ambulatory Visit: Payer: Self-pay | Admitting: Cardiovascular Disease

## 2013-12-22 ENCOUNTER — Ambulatory Visit: Payer: Medicare Other

## 2013-12-23 ENCOUNTER — Ambulatory Visit (INDEPENDENT_AMBULATORY_CARE_PROVIDER_SITE_OTHER): Payer: Medicare Other

## 2013-12-23 DIAGNOSIS — J45909 Unspecified asthma, uncomplicated: Secondary | ICD-10-CM

## 2013-12-24 MED ORDER — OMALIZUMAB 150 MG ~~LOC~~ SOLR
300.0000 mg | Freq: Once | SUBCUTANEOUS | Status: AC
  Administered 2013-12-24: 300 mg via SUBCUTANEOUS

## 2013-12-29 DIAGNOSIS — E291 Testicular hypofunction: Secondary | ICD-10-CM | POA: Diagnosis not present

## 2014-01-04 ENCOUNTER — Other Ambulatory Visit: Payer: Medicare Other

## 2014-01-04 ENCOUNTER — Ambulatory Visit (INDEPENDENT_AMBULATORY_CARE_PROVIDER_SITE_OTHER): Payer: Medicare Other | Admitting: Cardiology

## 2014-01-04 ENCOUNTER — Encounter: Payer: Self-pay | Admitting: Cardiology

## 2014-01-04 VITALS — BP 132/70 | HR 82 | Ht 70.0 in | Wt 212.0 lb

## 2014-01-04 DIAGNOSIS — J45909 Unspecified asthma, uncomplicated: Secondary | ICD-10-CM | POA: Diagnosis not present

## 2014-01-04 DIAGNOSIS — I2510 Atherosclerotic heart disease of native coronary artery without angina pectoris: Secondary | ICD-10-CM

## 2014-01-04 DIAGNOSIS — M79609 Pain in unspecified limb: Secondary | ICD-10-CM | POA: Diagnosis not present

## 2014-01-04 DIAGNOSIS — M25541 Pain in joints of right hand: Secondary | ICD-10-CM

## 2014-01-04 DIAGNOSIS — E785 Hyperlipidemia, unspecified: Secondary | ICD-10-CM

## 2014-01-04 DIAGNOSIS — M1990 Unspecified osteoarthritis, unspecified site: Secondary | ICD-10-CM

## 2014-01-04 DIAGNOSIS — I119 Hypertensive heart disease without heart failure: Secondary | ICD-10-CM | POA: Diagnosis not present

## 2014-01-04 LAB — BASIC METABOLIC PANEL
BUN: 40 mg/dL — ABNORMAL HIGH (ref 6–23)
CO2: 24 meq/L (ref 19–32)
CREATININE: 2 mg/dL — AB (ref 0.4–1.5)
Calcium: 9.4 mg/dL (ref 8.4–10.5)
Chloride: 105 mEq/L (ref 96–112)
GFR: 34.09 mL/min — ABNORMAL LOW (ref >60.00)
GLUCOSE: 127 mg/dL — AB (ref 70–99)
Potassium: 4.3 mEq/L (ref 3.5–5.1)
Sodium: 136 mEq/L (ref 135–145)

## 2014-01-04 LAB — CBC WITH DIFFERENTIAL/PLATELET
BASOS PCT: 0.3 % (ref 0.0–3.0)
Basophils Absolute: 0 10*3/uL (ref 0.0–0.1)
EOS PCT: 3.8 % (ref 0.0–5.0)
Eosinophils Absolute: 0.3 10*3/uL (ref 0.0–0.7)
HCT: 41.8 % (ref 39.0–52.0)
HEMOGLOBIN: 13.4 g/dL (ref 13.0–17.0)
LYMPHS ABS: 1.9 10*3/uL (ref 0.7–4.0)
Lymphocytes Relative: 26.9 % (ref 12.0–46.0)
MCHC: 32 g/dL (ref 30.0–36.0)
MCV: 84.8 fl (ref 78.0–100.0)
MONO ABS: 0.7 10*3/uL (ref 0.1–1.0)
Monocytes Relative: 9.4 % (ref 3.0–12.0)
NEUTROS ABS: 4.1 10*3/uL (ref 1.4–7.7)
Neutrophils Relative %: 59.6 % (ref 43.0–77.0)
Platelets: 162 10*3/uL (ref 150.0–400.0)
RBC: 4.93 Mil/uL (ref 4.22–5.81)
RDW: 16.7 % — ABNORMAL HIGH (ref 11.5–15.5)
WBC: 7 10*3/uL (ref 4.0–10.5)

## 2014-01-04 LAB — LIPID PANEL
CHOL/HDL RATIO: 4
Cholesterol: 104 mg/dL (ref 0–200)
HDL: 28.7 mg/dL — ABNORMAL LOW (ref >39.00)
LDL Cholesterol: 53 mg/dL (ref 0–99)
TRIGLYCERIDES: 114 mg/dL (ref 0.0–149.0)
VLDL: 22.8 mg/dL (ref 0.0–40.0)

## 2014-01-04 LAB — HEPATIC FUNCTION PANEL
ALK PHOS: 40 U/L (ref 39–117)
ALT: 20 U/L (ref 0–53)
AST: 22 U/L (ref 0–37)
Albumin: 4.1 g/dL (ref 3.5–5.2)
Bilirubin, Direct: 0.2 mg/dL (ref 0.0–0.3)
Total Bilirubin: 1.7 mg/dL — ABNORMAL HIGH (ref 0.2–1.2)
Total Protein: 6.9 g/dL (ref 6.0–8.3)

## 2014-01-04 NOTE — Patient Instructions (Addendum)
Will obtain labs today and call you with the results (lp/bmet/hfp/cbc)  Your physician recommends that you continue on your current medications as directed. Please refer to the Current Medication list given to you today.  Your physician wants you to follow-up in: 4 months with fasting labs (lp/bmet/hfp) You will receive a reminder letter in the mail two months in advance. If you don't receive a letter, please call our office to schedule the follow-up appointment.   Appointment with Dr Amedeo Plenty [phone] on June 15 at 10:00 for a 10:30

## 2014-01-04 NOTE — Assessment & Plan Note (Signed)
His symptoms of rhinitis and cough has significantly improved on his current pulmonary regimen

## 2014-01-04 NOTE — Assessment & Plan Note (Signed)
The patient has not had any recurrence of chest pain or angina.  He has not been having symptoms of CHF.  He does have mild exertional dyspnea walking up [facility].  His weight is up 1 pound since last visit

## 2014-01-04 NOTE — Assessment & Plan Note (Signed)
The patient is not having any headaches or dizzy spells.  No syncope.

## 2014-01-04 NOTE — Progress Notes (Signed)
Shane [name] Date of Birth:  22-Aug-1933 [name] [address] [place], [place]  [address] [phone]        Fax   [phone]   History of Present Illness: This pleasant 80 year old gentleman is seen for a scheduled followup office visit. He has a history of ischemic heart disease with ischemic cardiomyopathy. He had a nuclear stress test in 06/05/11 which showed no ischemia. His last echocardiogram was in 2010 and showed an ejection fraction of 35-40%. . On 08/01/11 he underwent successful right total knee replacement. He did very well in the physical therapy following knee replacement. The patient has had a recent problem with recurrent sinusitis and bronchitis. He recently saw pulmonologist Dr. Lyda [name] and has been doing better on his new regimen. He was given a short course of steroids which has helped him.  He is now on allergy injections subcutaneously every 2 weeks from Dr. Joya [name] these have helped his allergies and his cough significantly Since last visit he has had some problems with pain in the right thumb.    Current Outpatient Prescriptions  Medication Sig Dispense Refill  . Omalizumab (XOLAIR [place]) Inject into the skin. Every two weeks      . acetaminophen (TYLENOL) 500 MG tablet Take 500 mg by mouth as needed for pain.      Marland Kitchen amoxicillin (AMOXIL) 500 MG capsule Take as directed prior to dental procedure  10 capsule  0  . amoxicillin-clavulanate (AUGMENTIN) 875-125 MG per tablet Take 1 tablet by mouth 2 (two) times daily.  20 tablet  0  . aspirin EC 81 MG tablet Take 81 mg by mouth every morning.      Marland Kitchen atorvastatin (LIPITOR) 10 MG tablet TAKE 1 TABLET DAILY  90 tablet  2  . Cholecalciferol (VITAMIN D-3) 1000 UNITS CAPS Take 2,000 Units by mouth daily.      Marland Kitchen CIALIS 20 MG tablet as directed.      Marland Kitchen dextromethorphan-guaiFENesin (MUCINEX DM) 30-600 MG per 12 hr tablet Take 1 tablet by mouth 2 (two) times daily as needed.       Marland Kitchen DIOVAN HCT  320-12.5 MG per tablet TAKE 1 TABLET DAILY/  patient may have generic  90 tablet  1  . diphenhydrAMINE (BENADRYL) 25 mg capsule Take 25 mg by mouth every 6 (six) hours as needed for allergies.       Marland Kitchen EPINEPHrine (EPIPEN) 0.3 mg/0.3 mL SOAJ injection Inject 0.3 mLs (0.3 mg total) into the muscle once.  2 Device  0  . fluocinonide cream (LIDEX) 0.05 % as needed.      Marland Kitchen guaifenesin (WAL-TUSSIN) 100 MG/5ML syrup Take 200 mg by mouth as needed for cough.      . latanoprost (XALATAN) 0.005 % ophthalmic solution Place 1 drop into both eyes at bedtime.       . metFORMIN (GLUCOPHAGE) 500 MG tablet TAKE ONE TABLET BY MOUTH ONCE DAILY  90 tablet  0  . metoprolol succinate (TOPROL-XL) 50 MG 24 hr tablet Take 25 mg by mouth every morning. Take with or immediately following a meal.      . metoprolol succinate (TOPROL-XL) 50 MG 24 hr tablet TAKE ONE-HALF (1/2) TABLET DAILY OR AS DIRECTED  45 tablet  0  . Multiple Vitamin (MULTIVITAMIN) tablet Take 1 tablet by mouth daily.      Marland Kitchen NASONEX 50 MCG/ACT nasal spray USE 2 SPRAYS IN THE NOSE DAILY  1 g  2  . NIASPAN  1000 MG CR tablet TAKE 2 TABLETS AT BEDTIME  180 tablet  0  . NORVASC 10 MG tablet TAKE 1 TABLET DAILY  90 tablet  3  . spironolactone (ALDACTONE) 25 MG tablet Take 1 tablet (25 mg total) by mouth every other day.  45 tablet  0  . zolpidem (AMBIEN) 10 MG tablet Take 1/2 to 1 tablet as needed at bedtime for sleep  30 tablet  5   No current facility-administered medications for this visit.    Allergies  Allergen Reactions  . Codeine     anxiety  . Hydrocodone     anxiety  . Azithromycin Rash    Patient Active Problem List   Diagnosis Date Noted  . Pain in thumb joint with movement of right hand 01/04/2014  . Sinusitis, chronic 07/16/2013  . Asthma, moderate persistent 07/08/2013  . CAD s/p coronary arthery bypass graft   . BPH (benign prostatic hyperplasia)   . Kidney disease, chronic, stage III (GFR 30-59 ml/min)   . H/O Spinal stenosis   .  DM (diabetes mellitus), type 2 with renal complications [id number]  . Benign hypertensive heart disease without heart failure 11/07/2010  . Allergic rhinitis 11/07/2010  . Osteoarthritis 11/07/2010  . Ischemic heart disease   . Dyslipidemia   . Hypercholesterolemia     History  Smoking status  . Former Smoker -- 1.00 packs/day for 5 years  . Types: Cigarettes, Pipe, Cigars  . Quit date: 08/05/1958  Smokeless tobacco  . Never Used    History  Alcohol Use No    Family History  Problem Relation Age of Onset  . Heart attack Father   . Heart disease Father   . Colon polyps Father   . Diabetes Sister     x 2  . Heart disease Brother     x 2  . Lung cancer Mother   . Bone cancer Brother     Review of Systems: Constitutional: no fever chills diaphoresis or fatigue or change in weight.  Head and neck: no hearing loss, no epistaxis, no photophobia or visual disturbance. Respiratory: No cough, shortness of breath or wheezing. Cardiovascular: No chest pain peripheral edema, palpitations. Gastrointestinal: No abdominal distention, no abdominal pain, no change in bowel habits hematochezia or melena. Genitourinary: No dysuria, no frequency, no urgency, no nocturia. Musculoskeletal:No arthralgias, no back pain, no gait disturbance or myalgias. Neurological: No dizziness, no headaches, no numbness, no seizures, no syncope, no weakness, no tremors. Hematologic: No lymphadenopathy, no easy bruising. Psychiatric: No confusion, no hallucinations, no sleep disturbance.    Physical Exam: Filed Vitals:   01/04/14 0830  BP: 132/70  Pulse: 82   the general appearance reveals a well-developed well-nourished gentleman in distress.The head and neck exam reveals pupils equal and reactive.  Extraocular movements are full.  There is no scleral icterus.  The mouth and pharynx are normal.  The neck is supple.  The carotids reveal no bruits.  The jugular venous pressure is normal.  The  thyroid is  not enlarged.  There is no lymphadenopathy.  The chest is clear to percussion and auscultation.  There are no rales or rhonchi.  Expansion of the chest is symmetrical.  The precordium is quiet.  The first heart sound is normal.  The second heart sound is physiologically split.  There is no murmur gallop rub or click.  There is no abnormal lift or heave.  The abdomen is soft and nontender.  The bowel sounds are normal.  The  liver and spleen are not enlarged.  There are no abdominal masses.  There are no abdominal bruits.  Extremities reveal good pedal pulses.  There is no phlebitis or edema.  There is no cyanosis or clubbing.  Strength is normal and symmetrical in all extremities.  He notes pain on passive and active movement of the right thumb.  There is no lateralizing weakness.  There are no sensory deficits.  The skin is warm and dry.  There is no rash.  EKG today shows normal sinus rhythm with occasional PVCs and nonspecific interventricular block and is unchanged since 10/19/12.   Assessment / Plan: 1.  Coronary artery disease. 2. benign hypertensive heart disease without heart failure. 3. ischemic cardiomyopathy with ejection fraction 35-40%. 4. diabetes mellitus 5. allergic sinusitis 6. osteoarthritis  Plan: Continue same medication.  Lab work pending today. We will refer to Dr. Amedeo Plenty regarding right thumb pain. Recheck here in 4 months for office visit lipid panel hepatic function panel and basal metabolic panel

## 2014-01-04 NOTE — Assessment & Plan Note (Signed)
Patient complains of painful right thumb.  It hurts when he moves his thumb.  We will refer him to orthopedist Dr. Amedeo Plenty.

## 2014-01-05 NOTE — Progress Notes (Signed)
Quick Note:  Please report to patient. The recent labs are stable. Continue same medication and careful diet. The kidney is slightly drier. Drink plenty of water in the warm weather ______

## 2014-01-07 ENCOUNTER — Ambulatory Visit (INDEPENDENT_AMBULATORY_CARE_PROVIDER_SITE_OTHER): Payer: Medicare Other

## 2014-01-07 DIAGNOSIS — J45909 Unspecified asthma, uncomplicated: Secondary | ICD-10-CM

## 2014-01-07 DIAGNOSIS — J4540 Moderate persistent asthma, uncomplicated: Secondary | ICD-10-CM

## 2014-01-07 MED ORDER — OMALIZUMAB 150 MG ~~LOC~~ SOLR
300.0000 mg | Freq: Once | SUBCUTANEOUS | Status: AC
  Administered 2014-01-07: 300 mg via SUBCUTANEOUS

## 2014-01-13 DIAGNOSIS — E291 Testicular hypofunction: Secondary | ICD-10-CM | POA: Diagnosis not present

## 2014-01-17 DIAGNOSIS — M19049 Primary osteoarthritis, unspecified hand: Secondary | ICD-10-CM | POA: Diagnosis not present

## 2014-01-21 ENCOUNTER — Ambulatory Visit: Payer: Medicare Other

## 2014-01-24 ENCOUNTER — Ambulatory Visit (INDEPENDENT_AMBULATORY_CARE_PROVIDER_SITE_OTHER): Payer: Medicare Other

## 2014-01-24 ENCOUNTER — Ambulatory Visit: Payer: Medicare Other

## 2014-01-24 DIAGNOSIS — J4540 Moderate persistent asthma, uncomplicated: Secondary | ICD-10-CM

## 2014-01-24 DIAGNOSIS — J45909 Unspecified asthma, uncomplicated: Secondary | ICD-10-CM

## 2014-01-26 MED ORDER — OMALIZUMAB 150 MG ~~LOC~~ SOLR
300.0000 mg | Freq: Once | SUBCUTANEOUS | Status: AC
  Administered 2014-01-26: 300 mg via SUBCUTANEOUS

## 2014-01-31 DIAGNOSIS — E291 Testicular hypofunction: Secondary | ICD-10-CM | POA: Diagnosis not present

## 2014-02-02 DIAGNOSIS — H409 Unspecified glaucoma: Secondary | ICD-10-CM | POA: Diagnosis not present

## 2014-02-02 DIAGNOSIS — H4011X0 Primary open-angle glaucoma, stage unspecified: Secondary | ICD-10-CM | POA: Diagnosis not present

## 2014-02-07 ENCOUNTER — Ambulatory Visit (INDEPENDENT_AMBULATORY_CARE_PROVIDER_SITE_OTHER): Payer: Medicare Other

## 2014-02-07 DIAGNOSIS — J45909 Unspecified asthma, uncomplicated: Secondary | ICD-10-CM

## 2014-02-07 DIAGNOSIS — J4540 Moderate persistent asthma, uncomplicated: Secondary | ICD-10-CM

## 2014-02-09 MED ORDER — OMALIZUMAB 150 MG ~~LOC~~ SOLR
300.0000 mg | Freq: Once | SUBCUTANEOUS | Status: AC
  Administered 2014-02-09: 300 mg via SUBCUTANEOUS

## 2014-02-17 ENCOUNTER — Other Ambulatory Visit: Payer: Self-pay | Admitting: Cardiology

## 2014-02-17 DIAGNOSIS — N400 Enlarged prostate without lower urinary tract symptoms: Secondary | ICD-10-CM | POA: Diagnosis not present

## 2014-02-17 DIAGNOSIS — E291 Testicular hypofunction: Secondary | ICD-10-CM | POA: Diagnosis not present

## 2014-02-21 ENCOUNTER — Other Ambulatory Visit: Payer: Self-pay | Admitting: Cardiology

## 2014-02-21 ENCOUNTER — Ambulatory Visit (INDEPENDENT_AMBULATORY_CARE_PROVIDER_SITE_OTHER): Payer: Medicare Other

## 2014-02-21 DIAGNOSIS — J45909 Unspecified asthma, uncomplicated: Secondary | ICD-10-CM | POA: Diagnosis not present

## 2014-02-21 DIAGNOSIS — J4540 Moderate persistent asthma, uncomplicated: Secondary | ICD-10-CM

## 2014-02-25 MED ORDER — OMALIZUMAB 150 MG ~~LOC~~ SOLR
300.0000 mg | Freq: Once | SUBCUTANEOUS | Status: AC
  Administered 2014-02-25: 300 mg via SUBCUTANEOUS

## 2014-03-07 ENCOUNTER — Ambulatory Visit (INDEPENDENT_AMBULATORY_CARE_PROVIDER_SITE_OTHER): Payer: Medicare Other

## 2014-03-07 DIAGNOSIS — J45909 Unspecified asthma, uncomplicated: Secondary | ICD-10-CM | POA: Diagnosis not present

## 2014-03-07 DIAGNOSIS — J4540 Moderate persistent asthma, uncomplicated: Secondary | ICD-10-CM

## 2014-03-08 MED ORDER — OMALIZUMAB 150 MG ~~LOC~~ SOLR
300.0000 mg | Freq: Once | SUBCUTANEOUS | Status: AC
  Administered 2014-03-08: 300 mg via SUBCUTANEOUS

## 2014-03-09 DIAGNOSIS — E291 Testicular hypofunction: Secondary | ICD-10-CM | POA: Diagnosis not present

## 2014-03-13 ENCOUNTER — Other Ambulatory Visit: Payer: Self-pay | Admitting: Cardiology

## 2014-03-17 ENCOUNTER — Other Ambulatory Visit: Payer: Self-pay | Admitting: Cardiology

## 2014-03-28 ENCOUNTER — Ambulatory Visit (INDEPENDENT_AMBULATORY_CARE_PROVIDER_SITE_OTHER): Payer: Medicare Other

## 2014-03-28 ENCOUNTER — Ambulatory Visit (INDEPENDENT_AMBULATORY_CARE_PROVIDER_SITE_OTHER): Payer: Medicare Other | Admitting: Cardiology

## 2014-03-28 ENCOUNTER — Encounter: Payer: Self-pay | Admitting: Cardiology

## 2014-03-28 VITALS — BP 158/60 | HR 78 | Ht 70.0 in | Wt 212.0 lb

## 2014-03-28 DIAGNOSIS — I259 Chronic ischemic heart disease, unspecified: Secondary | ICD-10-CM

## 2014-03-28 DIAGNOSIS — I2510 Atherosclerotic heart disease of native coronary artery without angina pectoris: Secondary | ICD-10-CM | POA: Diagnosis not present

## 2014-03-28 DIAGNOSIS — I2583 Coronary atherosclerosis due to lipid rich plaque: Principal | ICD-10-CM

## 2014-03-28 DIAGNOSIS — E7800 Pure hypercholesterolemia, unspecified: Secondary | ICD-10-CM

## 2014-03-28 DIAGNOSIS — I119 Hypertensive heart disease without heart failure: Secondary | ICD-10-CM | POA: Diagnosis not present

## 2014-03-28 DIAGNOSIS — J4540 Moderate persistent asthma, uncomplicated: Secondary | ICD-10-CM

## 2014-03-28 DIAGNOSIS — E785 Hyperlipidemia, unspecified: Secondary | ICD-10-CM | POA: Diagnosis not present

## 2014-03-28 DIAGNOSIS — J45909 Unspecified asthma, uncomplicated: Secondary | ICD-10-CM

## 2014-03-28 LAB — BASIC METABOLIC PANEL
BUN: 34 mg/dL — ABNORMAL HIGH (ref 6–23)
CO2: 23 meq/L (ref 19–32)
Calcium: 9.4 mg/dL (ref 8.4–10.5)
Chloride: 103 mEq/L (ref 96–112)
Creatinine, Ser: 2.2 mg/dL — ABNORMAL HIGH (ref 0.4–1.5)
GFR: 31.53 mL/min — ABNORMAL LOW (ref >60.00)
GLUCOSE: 121 mg/dL — AB (ref 70–99)
POTASSIUM: 4.5 meq/L (ref 3.5–5.1)
SODIUM: 135 meq/L (ref 135–145)

## 2014-03-28 LAB — LIPID PANEL
Cholesterol: 120 mg/dL (ref 0–200)
HDL: 29.3 mg/dL — ABNORMAL LOW (ref >39.00)
LDL CALC: 59 mg/dL (ref 0–99)
NonHDL: 90.7
Total CHOL/HDL Ratio: 4
Triglycerides: 157 mg/dL — ABNORMAL HIGH (ref 0.0–149.0)
VLDL: 31.4 mg/dL (ref 0.0–40.0)

## 2014-03-28 LAB — HEPATIC FUNCTION PANEL
ALT: 22 U/L (ref 0–53)
AST: 27 U/L (ref 0–37)
Albumin: 4.1 g/dL (ref 3.5–5.2)
Alkaline Phosphatase: 41 U/L (ref 39–117)
BILIRUBIN DIRECT: 0.1 mg/dL (ref 0.0–0.3)
BILIRUBIN TOTAL: 1.2 mg/dL (ref 0.2–1.2)
Total Protein: 7.3 g/dL (ref 6.0–8.3)

## 2014-03-28 NOTE — Assessment & Plan Note (Signed)
The patient is not having any symptoms of increased shortness of breath.  No dizziness or syncope.  No chest pain

## 2014-03-28 NOTE — Assessment & Plan Note (Signed)
Patient is on low-dose atorvastatin for his ischemic heart disease.  No myalgias.  No Side effects.

## 2014-03-28 NOTE — Progress Notes (Signed)
Shane [name] Date of Birth:  03/16/1934 [place] [address] [place], [place]  [address] [phone]        Fax   [phone]   History of Present Illness: This pleasant 80 year old gentleman is seen for a scheduled followup office visit. He has a history of ischemic heart disease with ischemic cardiomyopathy. He had a nuclear stress test in 06/05/11 which showed no ischemia. His last echocardiogram was in 2010 and showed an ejection fraction of 35-40%. . On 08/01/11 he underwent successful right total knee replacement. He did very well in the physical therapy following knee replacement. The patient has had a recent problem with recurrent sinusitis and bronchitis. He recently saw pulmonologist Dr. Lyda Jester and has been doing better on his new regimen. He was given a short course of steroids which has helped him. He is now on allergy injections subcutaneously every 2 weeks from Dr. Joya Gaskins these have helped his allergies and his cough significantly  He has developed some problem with his gingiva and his mouth and his dentist as diagnosed possible allergic mouth ulcers.  He is starting on a steroid mouth wash that he is to swish and expectorate. He saw Dr. Amedeo Plenty about his arthritis of his thumb.  No surgery indicated yet.  The patient takes between 2 and 4 Aleve tablets daily The patient has an exercise bicycle at home which he uses about 45 minutes per day. The patient has a history of mild renal insufficiency.  We are checking a basal metabolic panel today.  Current Outpatient Prescriptions  Medication Sig Dispense Refill  . acetaminophen (TYLENOL) 500 MG tablet Take 500 mg by mouth as needed for pain.      Marland Kitchen amoxicillin (AMOXIL) 500 MG capsule Take as directed prior to dental procedure  10 capsule  0  . aspirin EC 81 MG tablet Take 81 mg by mouth every morning.      Marland Kitchen atorvastatin (LIPITOR) 10 MG tablet TAKE 1 TABLET DAILY  90 tablet  2  . Cholecalciferol  (VITAMIN D-3) 1000 UNITS CAPS Take 2,000 Units by mouth daily.      Marland Kitchen CIALIS 20 MG tablet as directed.      Marland Kitchen dextromethorphan-guaiFENesin (MUCINEX DM) 30-600 MG per 12 hr tablet Take 1 tablet by mouth 2 (two) times daily as needed.       Marland Kitchen DIOVAN HCT 320-12.5 MG per tablet TAKE ONE TABLET BY MOUTH ONCE DAILY  90 tablet  0  . diphenhydrAMINE (BENADRYL) 25 mg capsule Take 25 mg by mouth every 6 (six) hours as needed for allergies.       Marland Kitchen EPINEPHrine (EPIPEN) 0.3 mg/0.3 mL SOAJ injection Inject 0.3 mLs (0.3 mg total) into the muscle once.  2 Device  0  . fluocinonide cream (LIDEX) 0.05 % as needed.      Marland Kitchen guaifenesin (WAL-TUSSIN) 100 MG/5ML syrup Take 200 mg by mouth as needed for cough.      . latanoprost (XALATAN) 0.005 % ophthalmic solution Place 1 drop into both eyes at bedtime.       . metFORMIN (GLUCOPHAGE) 500 MG tablet TAKE ONE TABLET BY MOUTH ONCE DAILY  90 tablet  3  . metoprolol succinate (TOPROL-XL) 50 MG 24 hr tablet Take 25 mg by mouth every morning. Take with or immediately following a meal.      . Multiple Vitamin (MULTIVITAMIN) tablet Take 1 tablet by mouth daily.      Marland Kitchen NASONEX 50 MCG/ACT  nasal spray USE 2 SPRAYS IN THE NOSE DAILY  1 g  2  . NIASPAN 1000 MG CR tablet Take 1 tablet (1,000 mg total) by mouth 2 (two) times daily.  180 tablet  3  . NORVASC 10 MG tablet TAKE 1 TABLET DAILY  90 tablet  2  . Omalizumab (XOLAIR [place]) Inject into the skin. Every two weeks      . spironolactone (ALDACTONE) 25 MG tablet TAKE 1 TABLET EVERY OTHER DAY  45 tablet  0  . zolpidem (AMBIEN) 10 MG tablet Take 1/2 to 1 tablet as needed at bedtime for sleep  30 tablet  5   No current facility-administered medications for this visit.    Allergies  Allergen Reactions  . Codeine     anxiety  . Hydrocodone     anxiety  . Azithromycin Rash    Patient Active Problem List   Diagnosis Date Noted  . Pain in thumb joint with movement of right hand 01/04/2014  . Sinusitis, chronic 07/16/2013  .  Asthma, moderate persistent 07/08/2013  . CAD s/p coronary arthery bypass graft   . BPH (benign prostatic hyperplasia)   . Kidney disease, chronic, stage III (GFR 30-59 ml/min)   . H/O Spinal stenosis   . DM (diabetes mellitus), type 2 with renal complications [id number]  . Benign hypertensive heart disease without heart failure 11/07/2010  . Allergic rhinitis 11/07/2010  . Osteoarthritis 11/07/2010  . Ischemic heart disease   . Dyslipidemia   . Hypercholesterolemia     History  Smoking status  . Former Smoker -- 1.00 packs/day for 5 years  . Types: Cigarettes, Pipe, Cigars  . Quit date: 08/05/1958  Smokeless tobacco  . Never Used    History  Alcohol Use No    Family History  Problem Relation Age of Onset  . Heart attack Father   . Heart disease Father   . Colon polyps Father   . Diabetes Sister     x 2  . Heart disease Brother     x 2  . Lung cancer Mother   . Bone cancer Brother     Review of Systems: Constitutional: no fever chills diaphoresis or fatigue or change in weight.  Head and neck: no hearing loss, no epistaxis, no photophobia or visual disturbance. Respiratory: No cough, shortness of breath or wheezing. Cardiovascular: No chest pain peripheral edema, palpitations. Gastrointestinal: No abdominal distention, no abdominal pain, no change in bowel habits hematochezia or melena. Genitourinary: No dysuria, no frequency, no urgency, no nocturia. Musculoskeletal:No arthralgias, no back pain, no gait disturbance or myalgias. Neurological: No dizziness, no headaches, no numbness, no seizures, no syncope, no weakness, no tremors. Hematologic: No lymphadenopathy, no easy bruising. Psychiatric: No confusion, no hallucinations, no sleep disturbance.    Physical Exam: Filed Vitals:   03/28/14 1430  BP: 158/60  Pulse: 78   the general appearance reveals a well-developed well-nourished gentleman in no distress.The head and neck exam reveals pupils equal and  reactive.  Extraocular movements are full.  There is no scleral icterus.  The mouth and pharynx are normal.  The neck is supple.  The carotids reveal no bruits.  The jugular venous pressure is normal.  The  thyroid is not enlarged.  There is no lymphadenopathy.  The chest is clear to percussion and auscultation.  There are no rales or rhonchi.  Expansion of the chest is symmetrical.  The precordium is quiet.  The first heart sound is normal.  The second heart  sound is physiologically split.  There is no murmur gallop rub or click.  There is no abnormal lift or heave.  The abdomen is soft and nontender.  The bowel sounds are normal.  The liver and spleen are not enlarged.  There are no abdominal masses.  There are no abdominal bruits.  Extremities reveal good pedal pulses.  There is no phlebitis or edema.  There is no cyanosis or clubbing.  Strength is normal and symmetrical in all extremities.  There is no lateralizing weakness.  There are no sensory deficits.  The skin is warm and dry.  There is no rash.    Assessment / Plan: 1. Coronary artery disease, status post CABG 2. benign hypertensive heart disease without heart failure.  3. ischemic cardiomyopathy with ejection fraction 35-40%.  4. diabetes mellitus  5. allergic sinusitis  6. Osteoarthritis 7. chronic kidney disease stage III GFR 30-59 mL per minute 8. asthma and respiratory issues followed by Dr. Lyda Jester  Disposition: Continue same medication.  Check a basal metabolic panel today. Recheck in 4 months for office visit EKG lipid panel hepatic function panel and basal metabolic panel.

## 2014-03-28 NOTE — Assessment & Plan Note (Signed)
The patient has not had to take any sublingual nitroglycerin.  No chest discomfort.  His whole family just returned from a trip to [place] and they did a lot of walking in [place] and he did not have any difficulty.

## 2014-03-28 NOTE — Patient Instructions (Signed)
Will obtain labs today and call you with the results (bmet)  Your physician recommends that you continue on your current medications as directed. Please refer to the Current Medication list given to you today.  Your physician recommends that you schedule a follow-up appointment in: 4 months with fasting labs (lp/bmet/hfp) and ekg

## 2014-03-29 NOTE — Progress Notes (Signed)
Quick Note:  Please report to patient. The recent labs are stable. Continue same medication and careful diet. The creatinine is higher. This may be from the Aleve. Try to limit the number of Aleve he takes as much as possible. Drink plenty of water. ______

## 2014-03-30 MED ORDER — OMALIZUMAB 150 MG ~~LOC~~ SOLR
300.0000 mg | Freq: Once | SUBCUTANEOUS | Status: AC
  Administered 2014-03-30: 300 mg via SUBCUTANEOUS

## 2014-03-31 DIAGNOSIS — E291 Testicular hypofunction: Secondary | ICD-10-CM | POA: Diagnosis not present

## 2014-04-12 ENCOUNTER — Ambulatory Visit: Payer: Medicare Other

## 2014-04-12 ENCOUNTER — Telehealth: Payer: Self-pay | Admitting: Cardiology

## 2014-04-12 MED ORDER — AMOXICILLIN 500 MG PO CAPS: 500.0000 mg | ORAL_CAPSULE | Freq: Three times a day (TID) | ORAL | Status: DC

## 2014-04-12 NOTE — Telephone Encounter (Signed)
New message    Patient calling need an  antibiotic  called in for sinus infection.

## 2014-04-12 NOTE — Telephone Encounter (Signed)
Patient having head congestion with thick sputum. Has started draining down throat and worried going to go into chest. Refilled Amoxil 500 mg three times a day #20 sent to pharmacy.

## 2014-04-13 ENCOUNTER — Ambulatory Visit (INDEPENDENT_AMBULATORY_CARE_PROVIDER_SITE_OTHER): Payer: Medicare Other

## 2014-04-13 DIAGNOSIS — J45909 Unspecified asthma, uncomplicated: Secondary | ICD-10-CM

## 2014-04-13 DIAGNOSIS — J4540 Moderate persistent asthma, uncomplicated: Secondary | ICD-10-CM

## 2014-04-13 NOTE — Telephone Encounter (Signed)
Agree 

## 2014-04-14 ENCOUNTER — Other Ambulatory Visit: Payer: Self-pay | Admitting: Cardiology

## 2014-04-20 MED ORDER — OMALIZUMAB 150 MG ~~LOC~~ SOLR
300.0000 mg | Freq: Once | SUBCUTANEOUS | Status: AC
  Administered 2014-04-20: 300 mg via SUBCUTANEOUS

## 2014-04-29 ENCOUNTER — Ambulatory Visit: Payer: Medicare Other | Admitting: Critical Care Medicine

## 2014-04-29 ENCOUNTER — Ambulatory Visit (INDEPENDENT_AMBULATORY_CARE_PROVIDER_SITE_OTHER): Payer: Medicare Other

## 2014-04-29 ENCOUNTER — Ambulatory Visit: Payer: Medicare Other

## 2014-04-29 DIAGNOSIS — J45909 Unspecified asthma, uncomplicated: Secondary | ICD-10-CM

## 2014-04-29 DIAGNOSIS — J4540 Moderate persistent asthma, uncomplicated: Secondary | ICD-10-CM

## 2014-04-29 MED ORDER — OMALIZUMAB 150 MG ~~LOC~~ SOLR
300.0000 mg | Freq: Once | SUBCUTANEOUS | Status: AC
  Administered 2014-04-29: 300 mg via SUBCUTANEOUS

## 2014-05-02 DIAGNOSIS — E291 Testicular hypofunction: Secondary | ICD-10-CM | POA: Diagnosis not present

## 2014-05-14 ENCOUNTER — Other Ambulatory Visit: Payer: Self-pay | Admitting: Cardiology

## 2014-05-14 DIAGNOSIS — G4700 Insomnia, unspecified: Secondary | ICD-10-CM

## 2014-05-16 ENCOUNTER — Ambulatory Visit (INDEPENDENT_AMBULATORY_CARE_PROVIDER_SITE_OTHER): Payer: Medicare Other

## 2014-05-16 DIAGNOSIS — J4540 Moderate persistent asthma, uncomplicated: Secondary | ICD-10-CM

## 2014-05-23 DIAGNOSIS — E291 Testicular hypofunction: Secondary | ICD-10-CM | POA: Diagnosis not present

## 2014-05-25 ENCOUNTER — Other Ambulatory Visit: Payer: Self-pay | Admitting: Cardiology

## 2014-05-25 MED ORDER — OMALIZUMAB 150 MG ~~LOC~~ SOLR
300.0000 mg | Freq: Once | SUBCUTANEOUS | Status: AC
  Administered 2014-05-25: 300 mg via SUBCUTANEOUS

## 2014-05-30 ENCOUNTER — Ambulatory Visit (INDEPENDENT_AMBULATORY_CARE_PROVIDER_SITE_OTHER): Payer: Medicare Other

## 2014-05-30 DIAGNOSIS — J4540 Moderate persistent asthma, uncomplicated: Secondary | ICD-10-CM | POA: Diagnosis not present

## 2014-06-03 ENCOUNTER — Other Ambulatory Visit: Payer: Self-pay | Admitting: *Deleted

## 2014-06-03 DIAGNOSIS — G4700 Insomnia, unspecified: Secondary | ICD-10-CM

## 2014-06-03 NOTE — Telephone Encounter (Signed)
Pharmacy never received refill on 05/16/14

## 2014-06-04 MED ORDER — ZOLPIDEM TARTRATE 10 MG PO TABS: ORAL_TABLET | ORAL | Status: DC

## 2014-06-06 ENCOUNTER — Encounter: Payer: Self-pay | Admitting: Internal Medicine

## 2014-06-06 DIAGNOSIS — N400 Enlarged prostate without lower urinary tract symptoms: Secondary | ICD-10-CM | POA: Diagnosis not present

## 2014-06-06 DIAGNOSIS — E291 Testicular hypofunction: Secondary | ICD-10-CM | POA: Diagnosis not present

## 2014-06-06 DIAGNOSIS — Z87891 Personal history of nicotine dependence: Secondary | ICD-10-CM | POA: Diagnosis not present

## 2014-06-07 DIAGNOSIS — Z23 Encounter for immunization: Secondary | ICD-10-CM | POA: Diagnosis not present

## 2014-06-10 MED ORDER — OMALIZUMAB 150 MG ~~LOC~~ SOLR
300.0000 mg | Freq: Once | SUBCUTANEOUS | Status: AC
  Administered 2014-06-10: 300 mg via SUBCUTANEOUS

## 2014-06-15 ENCOUNTER — Other Ambulatory Visit: Payer: Self-pay | Admitting: Cardiology

## 2014-06-15 DIAGNOSIS — J4540 Moderate persistent asthma, uncomplicated: Secondary | ICD-10-CM

## 2014-06-16 ENCOUNTER — Ambulatory Visit (INDEPENDENT_AMBULATORY_CARE_PROVIDER_SITE_OTHER): Payer: Medicare Other

## 2014-06-16 ENCOUNTER — Ambulatory Visit: Payer: Medicare Other

## 2014-06-16 DIAGNOSIS — J4540 Moderate persistent asthma, uncomplicated: Secondary | ICD-10-CM

## 2014-06-16 DIAGNOSIS — E291 Testicular hypofunction: Secondary | ICD-10-CM | POA: Diagnosis not present

## 2014-06-20 ENCOUNTER — Other Ambulatory Visit: Payer: Self-pay | Admitting: *Deleted

## 2014-06-20 MED ORDER — VALSARTAN-HYDROCHLOROTHIAZIDE 320-12.5 MG PO TABS: ORAL_TABLET | ORAL | Status: DC

## 2014-06-22 MED ORDER — OMALIZUMAB 150 MG ~~LOC~~ SOLR
300.0000 mg | Freq: Once | SUBCUTANEOUS | Status: AC
  Administered 2014-06-15: 300 mg via SUBCUTANEOUS

## 2014-07-04 ENCOUNTER — Ambulatory Visit (INDEPENDENT_AMBULATORY_CARE_PROVIDER_SITE_OTHER): Payer: Medicare Other

## 2014-07-04 DIAGNOSIS — J4540 Moderate persistent asthma, uncomplicated: Secondary | ICD-10-CM

## 2014-07-04 MED ORDER — OMALIZUMAB 150 MG ~~LOC~~ SOLR
300.0000 mg | Freq: Once | SUBCUTANEOUS | Status: AC
  Administered 2014-07-04: 300 mg via SUBCUTANEOUS

## 2014-07-06 ENCOUNTER — Telehealth: Payer: Self-pay | Admitting: Cardiology

## 2014-07-06 DIAGNOSIS — E291 Testicular hypofunction: Secondary | ICD-10-CM | POA: Diagnosis not present

## 2014-07-06 NOTE — Telephone Encounter (Signed)
New msg  Left vm at pt home number about flu clinic Sat.

## 2014-07-12 ENCOUNTER — Other Ambulatory Visit: Payer: Self-pay | Admitting: *Deleted

## 2014-07-12 MED ORDER — VALSARTAN-HYDROCHLOROTHIAZIDE 320-12.5 MG PO TABS: ORAL_TABLET | ORAL | Status: DC

## 2014-07-15 ENCOUNTER — Encounter: Payer: Self-pay | Admitting: Cardiology

## 2014-07-18 ENCOUNTER — Ambulatory Visit (INDEPENDENT_AMBULATORY_CARE_PROVIDER_SITE_OTHER): Payer: Medicare Other

## 2014-07-18 DIAGNOSIS — J4520 Mild intermittent asthma, uncomplicated: Secondary | ICD-10-CM | POA: Diagnosis not present

## 2014-07-19 MED ORDER — OMALIZUMAB 150 MG ~~LOC~~ SOLR
300.0000 mg | Freq: Once | SUBCUTANEOUS | Status: AC
  Administered 2014-07-18: 300 mg via SUBCUTANEOUS

## 2014-07-26 ENCOUNTER — Other Ambulatory Visit (INDEPENDENT_AMBULATORY_CARE_PROVIDER_SITE_OTHER): Payer: Medicare Other | Admitting: *Deleted

## 2014-07-26 ENCOUNTER — Ambulatory Visit (INDEPENDENT_AMBULATORY_CARE_PROVIDER_SITE_OTHER): Payer: Medicare Other | Admitting: Cardiology

## 2014-07-26 VITALS — BP 138/72 | HR 84 | Ht 70.0 in | Wt 219.0 lb

## 2014-07-26 DIAGNOSIS — I119 Hypertensive heart disease without heart failure: Secondary | ICD-10-CM

## 2014-07-26 DIAGNOSIS — I259 Chronic ischemic heart disease, unspecified: Secondary | ICD-10-CM

## 2014-07-26 DIAGNOSIS — E785 Hyperlipidemia, unspecified: Secondary | ICD-10-CM

## 2014-07-26 LAB — CBC WITH DIFFERENTIAL/PLATELET
Basophils Absolute: 0 10*3/uL (ref 0.0–0.1)
Basophils Relative: 0.5 % (ref 0.0–3.0)
EOS ABS: 0.4 10*3/uL (ref 0.0–0.7)
Eosinophils Relative: 6.3 % — ABNORMAL HIGH (ref 0.0–5.0)
HCT: 41.3 % (ref 39.0–52.0)
Hemoglobin: 12.9 g/dL — ABNORMAL LOW (ref 13.0–17.0)
Lymphocytes Relative: 30.3 % (ref 12.0–46.0)
Lymphs Abs: 1.8 10*3/uL (ref 0.7–4.0)
MCHC: 31.2 g/dL (ref 30.0–36.0)
MCV: 88.4 fl (ref 78.0–100.0)
MONOS PCT: 11.6 % (ref 3.0–12.0)
Monocytes Absolute: 0.7 10*3/uL (ref 0.1–1.0)
NEUTROS PCT: 51.3 % (ref 43.0–77.0)
Neutro Abs: 3.1 10*3/uL (ref 1.4–7.7)
Platelets: 151 10*3/uL (ref 150.0–400.0)
RBC: 4.67 Mil/uL (ref 4.22–5.81)
RDW: 15.9 % — ABNORMAL HIGH (ref 11.5–15.5)
WBC: 6 10*3/uL (ref 4.0–10.5)

## 2014-07-26 LAB — BASIC METABOLIC PANEL
BUN: 35 mg/dL — ABNORMAL HIGH (ref 6–23)
CALCIUM: 8.9 mg/dL (ref 8.4–10.5)
CO2: 25 mEq/L (ref 19–32)
Chloride: 104 mEq/L (ref 96–112)
Creatinine, Ser: 1.9 mg/dL — ABNORMAL HIGH (ref 0.4–1.5)
GFR: 36.98 mL/min — AB (ref >60.00)
GLUCOSE: 122 mg/dL — AB (ref 70–99)
POTASSIUM: 4.1 meq/L (ref 3.5–5.1)
Sodium: 136 mEq/L (ref 135–145)

## 2014-07-26 LAB — HEPATIC FUNCTION PANEL
ALK PHOS: 45 U/L (ref 39–117)
ALT: 24 U/L (ref 0–53)
AST: 23 U/L (ref 0–37)
Albumin: 4 g/dL (ref 3.5–5.2)
Bilirubin, Direct: 0.2 mg/dL (ref 0.0–0.3)
Total Bilirubin: 1.4 mg/dL — ABNORMAL HIGH (ref 0.2–1.2)
Total Protein: 6.8 g/dL (ref 6.0–8.3)

## 2014-07-26 LAB — LIPID PANEL
CHOLESTEROL: 105 mg/dL (ref 0–200)
HDL: 24.1 mg/dL — ABNORMAL LOW (ref >39.00)
LDL Cholesterol: 61 mg/dL (ref 0–99)
NONHDL: 80.9
Total CHOL/HDL Ratio: 4
Triglycerides: 102 mg/dL (ref 0.0–149.0)
VLDL: 20.4 mg/dL (ref 0.0–40.0)

## 2014-07-26 MED ORDER — SPIRONOLACTONE 25 MG PO TABS: 25.0000 mg | ORAL_TABLET | ORAL | Status: DC

## 2014-07-26 MED ORDER — FLUOCINONIDE 0.05 % EX CREA
TOPICAL_CREAM | Freq: Two times a day (BID) | CUTANEOUS | Status: DC | PRN
Start: 2014-07-26 — End: 2014-10-11

## 2014-07-26 NOTE — Patient Instructions (Signed)
Your physician recommends that you continue on your current medications as directed. Please refer to the Current Medication list given to you today.  Your physician wants you to follow-up in:  4 months with lipids/liver functions/bmet and cbc. You will receive a reminder letter in the mail two months in advance. If you don't receive a letter, please call our office to schedule the follow-up appointment

## 2014-07-26 NOTE — Progress Notes (Signed)
[name] Date of Birth:  [birth year] [place] [address] [place], [place]  [address] [phone]        Fax   [phone]   History of Present Illness: This pleasant 80 year old gentleman is seen for a scheduled followup office visit. He has a history of ischemic heart disease with ischemic cardiomyopathy. He had a nuclear stress test in 06/05/11 which showed no ischemia. His last echocardiogram was in 2010 and showed an ejection fraction of 35-40%. . On 08/01/11 he underwent successful right total knee replacement. He did very well in the physical therapy following knee replacement.  Since last visit he has been doing well with no chest pain.  He has not been as careful with his diet.  His weight is up 7 pounds.  There have been a lot of Christmas gatherings at his church. Current Outpatient Prescriptions  Medication Sig Dispense Refill  . acetaminophen (TYLENOL) 500 MG tablet Take 500 mg by mouth as needed for pain.    Marland Kitchen amoxicillin (AMOXIL) 500 MG capsule Take 1 capsule (500 mg total) by mouth 3 (three) times daily. 20 capsule 0  . aspirin EC 81 MG tablet Take 81 mg by mouth every morning.    Marland Kitchen atorvastatin (LIPITOR) 10 MG tablet TAKE 1 TABLET DAILY 90 tablet 1  . Cholecalciferol (VITAMIN D-3) 1000 UNITS CAPS Take 2,000 Units by mouth daily.    Marland Kitchen CIALIS 20 MG tablet as directed.    Marland Kitchen dextromethorphan-guaiFENesin (MUCINEX DM) 30-600 MG per 12 hr tablet Take 1 tablet by mouth 2 (two) times daily as needed.     . diphenhydrAMINE (BENADRYL) 25 mg capsule Take 25 mg by mouth every 6 (six) hours as needed for allergies.     Marland Kitchen EPINEPHrine (EPIPEN) 0.3 mg/0.3 mL SOAJ injection Inject 0.3 mLs (0.3 mg total) into the muscle once. 2 Device 0  . fluocinonide cream (LIDEX) 0.05 % Apply topically 2 (two) times daily as needed. 30 g 6  . guaifenesin (WAL-TUSSIN) 100 MG/5ML syrup Take 200 mg by mouth as needed for cough.    . latanoprost (XALATAN) 0.005 % ophthalmic  solution Place 1 drop into both eyes at bedtime.     . metFORMIN (GLUCOPHAGE) 500 MG tablet TAKE ONE TABLET BY MOUTH ONCE DAILY 90 tablet 3  . metoprolol succinate (TOPROL-XL) 50 MG 24 hr tablet TAKE ONE-HALF (1/2) TABLET DAILY OR AS DIRECTED 45 tablet 3  . Multiple Vitamin (MULTIVITAMIN) tablet Take 1 tablet by mouth daily.    Marland Kitchen NASONEX 50 MCG/ACT nasal spray USE 2 SPRAYS IN THE NOSE DAILY 1 g 2  . NIASPAN 1000 MG CR tablet Take 1 tablet (1,000 mg total) by mouth 2 (two) times daily. (Patient taking differently: Take 1,000 mg by mouth once. ) 180 tablet 3  . NORVASC 10 MG tablet TAKE 1 TABLET DAILY 90 tablet 2  . spironolactone (ALDACTONE) 25 MG tablet Take 1 tablet (25 mg total) by mouth every other day. 45 tablet 3  . valsartan-hydrochlorothiazide (DIOVAN HCT) 320-12.5 MG per tablet TAKE ONE TABLET BY MOUTH ONCE DAILY 90 tablet 0  . zolpidem (AMBIEN) 10 MG tablet TAKE ONE-HALF TO ONE TABLET BY MOUTH AT BEDTIME AS NEEDED FOR SLEEP 30 tablet 5   No current facility-administered medications for this visit.    Allergies  Allergen Reactions  . Codeine     anxiety  . Hydrocodone     anxiety  . Azithromycin Rash    Patient Active  Problem List   Diagnosis Date Noted  . Pain in thumb joint with movement of right hand 01/04/2014  . Sinusitis, chronic 07/16/2013  . Asthma, moderate persistent 07/08/2013  . CAD s/p coronary arthery bypass graft   . BPH (benign prostatic hyperplasia)   . Kidney disease, chronic, stage III (GFR 30-59 ml/min)   . H/O Spinal stenosis   . DM (diabetes mellitus), type 2 with renal complications [id number]  . Benign hypertensive heart disease without heart failure 11/07/2010  . Allergic rhinitis 11/07/2010  . Osteoarthritis 11/07/2010  . Ischemic heart disease   . Dyslipidemia   . Hypercholesterolemia     History  Smoking status  . Former Smoker -- 1.00 packs/day for 5 years  . Types: Cigarettes, Pipe, Cigars  . Quit date: 08/05/1958  Smokeless tobacco    . Never Used    History  Alcohol Use No    Family History  Problem Relation Age of Onset  . Heart attack Father   . Heart disease Father   . Colon polyps Father   . Diabetes Sister     x 2  . Heart disease Brother     x 2  . Lung cancer Mother   . Bone cancer Brother     Review of Systems: Constitutional: no fever chills diaphoresis or fatigue or change in weight.  Head and neck: no hearing loss, no epistaxis, no photophobia or visual disturbance. Respiratory: No cough, shortness of breath or wheezing. Cardiovascular: No chest pain peripheral edema, palpitations. Gastrointestinal: No abdominal distention, no abdominal pain, no change in bowel habits hematochezia or melena. Genitourinary: No dysuria, no frequency, no urgency, no nocturia. Musculoskeletal:No arthralgias, no back pain, no gait disturbance or myalgias. Neurological: No dizziness, no headaches, no numbness, no seizures, no syncope, no weakness, no tremors. Hematologic: No lymphadenopathy, no easy bruising. Psychiatric: No confusion, no hallucinations, no sleep disturbance.    Physical Exam: Filed Vitals:   07/26/14 0923  BP: 138/72  Pulse: 84   the general appearance reveals a well-developed well-nourished gentleman in no distress.The head and neck exam reveals pupils equal and reactive.  Extraocular movements are full.  There is no scleral icterus.  The mouth and pharynx are normal.  The neck is supple.  The carotids reveal no bruits.  The jugular venous pressure is normal.  The  thyroid is not enlarged.  There is no lymphadenopathy.  The chest is clear to percussion and auscultation.  There are no rales or rhonchi.  Expansion of the chest is symmetrical.  The precordium is quiet.  The first heart sound is normal.  The second heart sound is physiologically split.  There is no murmur gallop rub or click.  There is no abnormal lift or heave.  The abdomen is soft and nontender.  The bowel sounds are normal.  The  liver and spleen are not enlarged.  There are no abdominal masses.  There are no abdominal bruits.  Extremities reveal good pedal pulses.  There is no phlebitis or edema.  There is no cyanosis or clubbing.  Strength is normal and symmetrical in all extremities.  There is no lateralizing weakness.  There are no sensory deficits.  The skin is warm and dry.  There is no rash.  EKG today shows normal sinus rhythm with occasional PVC.  There is a nonspecific intraventricular block with of the left bundle-branch block type.  There is left axis deviation.  Assessment / Plan: 1. Coronary artery disease, status post CABG 2. benign  hypertensive heart disease without heart failure.  3. ischemic cardiomyopathy with ejection fraction 35-40%.  4. diabetes mellitus  5. allergic sinusitis  6. Osteoarthritis 7. chronic kidney disease stage III GFR 30-59 mL per minute 8. asthma and respiratory issues followed by Dr. Lyda Jester  Disposition: Continue same Flasher work today is pending.return months for office visit EKG lipid panel hepatic function panel and basal metabolic panel.

## 2014-07-27 ENCOUNTER — Telehealth: Payer: Self-pay | Admitting: *Deleted

## 2014-07-27 NOTE — Telephone Encounter (Signed)
pt notified about lab results with verbal understanding  

## 2014-08-01 ENCOUNTER — Ambulatory Visit (INDEPENDENT_AMBULATORY_CARE_PROVIDER_SITE_OTHER): Payer: Medicare Other

## 2014-08-01 ENCOUNTER — Ambulatory Visit: Payer: Medicare Other

## 2014-08-01 DIAGNOSIS — J4520 Mild intermittent asthma, uncomplicated: Secondary | ICD-10-CM | POA: Diagnosis not present

## 2014-08-02 MED ORDER — OMALIZUMAB 150 MG ~~LOC~~ SOLR
300.0000 mg | Freq: Once | SUBCUTANEOUS | Status: AC
  Administered 2014-08-01: 300 mg via SUBCUTANEOUS

## 2014-08-10 DIAGNOSIS — Z961 Presence of intraocular lens: Secondary | ICD-10-CM | POA: Diagnosis not present

## 2014-08-10 DIAGNOSIS — H4011X1 Primary open-angle glaucoma, mild stage: Secondary | ICD-10-CM | POA: Diagnosis not present

## 2014-08-15 ENCOUNTER — Ambulatory Visit (INDEPENDENT_AMBULATORY_CARE_PROVIDER_SITE_OTHER): Payer: Medicare Other

## 2014-08-15 DIAGNOSIS — J4540 Moderate persistent asthma, uncomplicated: Secondary | ICD-10-CM

## 2014-08-18 DIAGNOSIS — E291 Testicular hypofunction: Secondary | ICD-10-CM | POA: Diagnosis not present

## 2014-08-18 MED ORDER — OMALIZUMAB 150 MG ~~LOC~~ SOLR
300.0000 mg | Freq: Once | SUBCUTANEOUS | Status: AC
  Administered 2014-08-15: 300 mg via SUBCUTANEOUS

## 2014-08-29 ENCOUNTER — Ambulatory Visit (INDEPENDENT_AMBULATORY_CARE_PROVIDER_SITE_OTHER): Payer: Medicare Other

## 2014-08-29 DIAGNOSIS — J4540 Moderate persistent asthma, uncomplicated: Secondary | ICD-10-CM | POA: Diagnosis not present

## 2014-09-02 MED ORDER — OMALIZUMAB 150 MG ~~LOC~~ SOLR
300.0000 mg | Freq: Once | SUBCUTANEOUS | Status: AC
  Administered 2014-08-29: 300 mg via SUBCUTANEOUS

## 2014-09-07 DIAGNOSIS — E291 Testicular hypofunction: Secondary | ICD-10-CM | POA: Diagnosis not present

## 2014-09-12 ENCOUNTER — Ambulatory Visit (INDEPENDENT_AMBULATORY_CARE_PROVIDER_SITE_OTHER): Payer: Medicare Other

## 2014-09-12 DIAGNOSIS — J4540 Moderate persistent asthma, uncomplicated: Secondary | ICD-10-CM

## 2014-09-16 MED ORDER — OMALIZUMAB 150 MG ~~LOC~~ SOLR
300.0000 mg | Freq: Once | SUBCUTANEOUS | Status: AC
  Administered 2014-09-12: 300 mg via SUBCUTANEOUS

## 2014-09-21 ENCOUNTER — Other Ambulatory Visit: Payer: Self-pay | Admitting: Cardiology

## 2014-09-26 ENCOUNTER — Ambulatory Visit (INDEPENDENT_AMBULATORY_CARE_PROVIDER_SITE_OTHER): Payer: Medicare Other

## 2014-09-26 DIAGNOSIS — E291 Testicular hypofunction: Secondary | ICD-10-CM | POA: Diagnosis not present

## 2014-09-26 DIAGNOSIS — J4540 Moderate persistent asthma, uncomplicated: Secondary | ICD-10-CM

## 2014-09-27 MED ORDER — OMALIZUMAB 150 MG ~~LOC~~ SOLR
300.0000 mg | Freq: Once | SUBCUTANEOUS | Status: AC
Start: 2014-09-27 — End: 2014-09-26
  Administered 2014-09-26: 300 mg via SUBCUTANEOUS

## 2014-10-05 ENCOUNTER — Inpatient Hospital Stay (HOSPITAL_COMMUNITY)
Admission: EM | Admit: 2014-10-05 | Discharge: 2014-10-11 | DRG: 227 | Disposition: A | Discharge status: Home / Self Care | Payer: Medicare Other | Attending: Cardiology | Admitting: Cardiology

## 2014-10-05 ENCOUNTER — Encounter (HOSPITAL_COMMUNITY): Payer: Self-pay | Admitting: Emergency Medicine

## 2014-10-05 ENCOUNTER — Telehealth: Payer: Self-pay | Admitting: *Deleted

## 2014-10-05 ENCOUNTER — Emergency Department (HOSPITAL_COMMUNITY): Payer: Medicare Other

## 2014-10-05 DIAGNOSIS — Z951 Presence of aortocoronary bypass graft: Secondary | ICD-10-CM

## 2014-10-05 DIAGNOSIS — R0602 Shortness of breath: Secondary | ICD-10-CM | POA: Diagnosis not present

## 2014-10-05 DIAGNOSIS — Z7982 Long term (current) use of aspirin: Secondary | ICD-10-CM

## 2014-10-05 DIAGNOSIS — I472 Ventricular tachycardia: Principal | ICD-10-CM | POA: Diagnosis present

## 2014-10-05 DIAGNOSIS — Z87891 Personal history of nicotine dependence: Secondary | ICD-10-CM

## 2014-10-05 DIAGNOSIS — M1990 Unspecified osteoarthritis, unspecified site: Secondary | ICD-10-CM | POA: Diagnosis present

## 2014-10-05 DIAGNOSIS — I129 Hypertensive chronic kidney disease with stage 1 through stage 4 chronic kidney disease, or unspecified chronic kidney disease: Secondary | ICD-10-CM | POA: Diagnosis present

## 2014-10-05 DIAGNOSIS — R55 Syncope and collapse: Secondary | ICD-10-CM

## 2014-10-05 DIAGNOSIS — E780 Pure hypercholesterolemia: Secondary | ICD-10-CM | POA: Diagnosis present

## 2014-10-05 DIAGNOSIS — N400 Enlarged prostate without lower urinary tract symptoms: Secondary | ICD-10-CM | POA: Diagnosis present

## 2014-10-05 DIAGNOSIS — E785 Hyperlipidemia, unspecified: Secondary | ICD-10-CM | POA: Diagnosis present

## 2014-10-05 DIAGNOSIS — I447 Left bundle-branch block, unspecified: Secondary | ICD-10-CM | POA: Diagnosis present

## 2014-10-05 DIAGNOSIS — I255 Ischemic cardiomyopathy: Secondary | ICD-10-CM | POA: Diagnosis not present

## 2014-10-05 DIAGNOSIS — Z959 Presence of cardiac and vascular implant and graft, unspecified: Secondary | ICD-10-CM

## 2014-10-05 DIAGNOSIS — N183 Chronic kidney disease, stage 3 (moderate): Secondary | ICD-10-CM | POA: Diagnosis not present

## 2014-10-05 DIAGNOSIS — I5022 Chronic systolic (congestive) heart failure: Secondary | ICD-10-CM | POA: Diagnosis present

## 2014-10-05 DIAGNOSIS — I2510 Atherosclerotic heart disease of native coronary artery without angina pectoris: Secondary | ICD-10-CM | POA: Diagnosis present

## 2014-10-05 DIAGNOSIS — I252 Old myocardial infarction: Secondary | ICD-10-CM

## 2014-10-05 DIAGNOSIS — H409 Unspecified glaucoma: Secondary | ICD-10-CM | POA: Diagnosis present

## 2014-10-05 DIAGNOSIS — E876 Hypokalemia: Secondary | ICD-10-CM | POA: Diagnosis present

## 2014-10-05 DIAGNOSIS — E119 Type 2 diabetes mellitus without complications: Secondary | ICD-10-CM | POA: Diagnosis present

## 2014-10-05 DIAGNOSIS — R0600 Dyspnea, unspecified: Secondary | ICD-10-CM

## 2014-10-05 DIAGNOSIS — R404 Transient alteration of awareness: Secondary | ICD-10-CM | POA: Diagnosis not present

## 2014-10-05 DIAGNOSIS — Z8249 Family history of ischemic heart disease and other diseases of the circulatory system: Secondary | ICD-10-CM

## 2014-10-05 DIAGNOSIS — Z86010 Personal history of colonic polyps: Secondary | ICD-10-CM

## 2014-10-05 DIAGNOSIS — Z96651 Presence of right artificial knee joint: Secondary | ICD-10-CM | POA: Diagnosis present

## 2014-10-05 LAB — PROTIME-INR
INR: 1.07 (ref 0.00–1.49)
PROTHROMBIN TIME: 14 s (ref 11.6–15.2)

## 2014-10-05 LAB — I-STAT TROPONIN, ED
TROPONIN I, POC: 0.01 ng/mL (ref 0.00–0.08)
Troponin i, poc: 0.03 ng/mL (ref 0.00–0.08)

## 2014-10-05 LAB — URINALYSIS, ROUTINE W REFLEX MICROSCOPIC
BILIRUBIN URINE: NEGATIVE
Glucose, UA: 100 mg/dL — AB
KETONES UR: NEGATIVE mg/dL
LEUKOCYTES UA: NEGATIVE
NITRITE: NEGATIVE
PH: 6 (ref 5.0–8.0)
Specific Gravity, Urine: 1.014 (ref 1.005–1.030)
UROBILINOGEN UA: 0.2 mg/dL (ref 0.0–1.0)

## 2014-10-05 LAB — URINE MICROSCOPIC-ADD ON

## 2014-10-05 LAB — CBC
HEMATOCRIT: 41.1 % (ref 39.0–52.0)
HEMOGLOBIN: 12.9 g/dL — AB (ref 13.0–17.0)
MCH: 27 pg (ref 26.0–34.0)
MCHC: 31.4 g/dL (ref 30.0–36.0)
MCV: 86.2 fL (ref 78.0–100.0)
Platelets: 131 10*3/uL — ABNORMAL LOW (ref 150–400)
RBC: 4.77 MIL/uL (ref 4.22–5.81)
RDW: 14.7 % (ref 11.5–15.5)
WBC: 6.3 10*3/uL (ref 4.0–10.5)

## 2014-10-05 LAB — CBG MONITORING, ED: GLUCOSE-CAPILLARY: 172 mg/dL — AB (ref 70–99)

## 2014-10-05 LAB — BASIC METABOLIC PANEL
Anion gap: 9 (ref 5–15)
BUN: 30 mg/dL — AB (ref 6–23)
CHLORIDE: 102 mmol/L (ref 96–112)
CO2: 22 mmol/L (ref 19–32)
CREATININE: 1.89 mg/dL — AB (ref 0.50–1.35)
Calcium: 8.5 mg/dL (ref 8.4–10.5)
GFR calc Af Amer: 37 mL/min — ABNORMAL LOW (ref >90)
GFR calc non Af Amer: 32 mL/min — ABNORMAL LOW (ref >90)
Glucose, Bld: 171 mg/dL — ABNORMAL HIGH (ref 70–99)
Potassium: 3.7 mmol/L (ref 3.5–5.1)
Sodium: 133 mmol/L — ABNORMAL LOW (ref 135–145)

## 2014-10-05 MED ORDER — AMOXICILLIN 500 MG PO CAPS: 500.0000 mg | ORAL_CAPSULE | Freq: Three times a day (TID) | ORAL | Status: DC

## 2014-10-05 NOTE — ED Notes (Signed)
Per EMS, pt had gone to bed, waiting for his wife. The pt states that next thing he remembers is waking up feeling extremely SOB, with his wife standing over him. Per EMS, pt's wife reported that the pt had a period of confusion. Pt was A&Ox4 upon EMS arrival. Pt denies pain or SOB at this time. Pt started taking amoxicillan today for a sinus infection. Pt with hx of htn. Pt states that he has not taken his evening meds.

## 2014-10-05 NOTE — ED Notes (Signed)
CBG 172

## 2014-10-05 NOTE — ED Notes (Signed)
EDP at bedside speaking to patient and family members.

## 2014-10-05 NOTE — Telephone Encounter (Signed)
Patient in office today with wife and he was having symptoms of sinusitis, he discussed with  Dr. Mare Ferrari  Per  Dr. Mare Ferrari Rx Amoxil 500 mg three times a day #20, Rx sent to pharmacy

## 2014-10-05 NOTE — ED Provider Notes (Signed)
CSN: [id number]     Arrival date & time 10/05/14  2124 History   First MD Initiated Contact with Patient 10/05/14 2149     Chief Complaint  Patient presents with  . Loss of Consciousness     (Consider location/radiation/quality/duration/timing/severity/associated sxs/prior Treatment) HPI   Shane [name] is a(n) 81 y.o. male who presents to the ED with cc of syncope. The patient was at home tonight with his wife. He is laying down in his bed with the he came suddenly extremely short of breath and very hot. Patient states he doesn't remember anything until he woke up approximately 2 minutes later. He states that he was here and lost consciousness. His wife states that he suddenly lost consciousness while talking to her. He looks short of breath. He was unable to respond. She states that he became diaphoretic. This lasted approximately 2 minutes. She called 911. During the time of the event. He did not appear to have any seizure-like activity. The patient has been taking Benadryl for his allergies. 2 days ago he began taking low-dose Sudafed for sinus pressure. He denies any other changes in medications. He has a past medical history of myocardial infarction in 1985 with a quintuple bypass. Last known EF of 45% + ischemic cardiomyopathy. Patient is followed by Dr. Mare Ferrari. He denies any chest pain, nausea. He has a history of one syncopal episode several years ago when he was very hypokalemic.  Past Medical History  Diagnosis Date  . Coronary artery disease     remote CABG in 1985, cath in 2003 by Dr. Lia Foyer with no PCI, last nuclear in 2010 showing scar and EF of 28%.   . Dyslipidemia   . Hypercholesterolemia   . Left ventricular dysfunction     28% per nuclear in 2010 and 35 to 40% per echo in 2010  . HTN (hypertension)   . Diabetes mellitus   . Cellulitis of arm, left     MSSA  . Spinal stenosis   . BPH (benign prostatic hyperplasia)   . Allergic rhinitis 01-08-13    Uses nebulizer  for chronic sinus issues and Mucinex.  . Abnormal nuclear cardiac imaging test Nov 2010    moderate area of infarct in the inferior wall with only minimal reversibility and EF of 28%  . Myocardial infarction     1985  . Pneumonia     hx of several times years ago   . Blood transfusion     ? at time of bypass surgery   . Arthritis     osteoarthritis   . Colon polyps     adenomatous  . Neuromuscular disorder     legs mild paralysis-able to walk"nerve damage"-legs- left leg brace   . CKD (chronic kidney disease), stage III     "was told due to meds he takes"-no Renal workup done  . Glaucoma 01-08-13    tx. eye drops   Past Surgical History  Procedure Laterality Date  . Cardiac catheterization  2003  . Lumbar disc surgery    . Prostate surgery    . Back surgery      hx of back surgery x 4   . Total knee arthroplasty  08/01/2011    Procedure: TOTAL KNEE ARTHROPLASTY;  Surgeon: Johnn Hai;  Location: [facility];  Service: Orthopedics;  Laterality: Right;  . Cataract       recent cataract surgery 6'14  . Coronary artery bypass graft  1985    x 5  vessels  . Colonoscopy N/A 01/25/2013    Procedure: COLONOSCOPY;  Surgeon: Irene Shipper, MD;  Location: [facility];  Service: Endoscopy;  Laterality: N/A;   Family History  Problem Relation Age of Onset  . Heart attack Father   . Heart disease Father   . Colon polyps Father   . Diabetes Sister     x 2  . Heart disease Brother     x 2  . Lung cancer Mother   . Bone cancer Brother    History  Substance Use Topics  . Smoking status: Former Smoker -- 1.00 packs/day for 5 years    Types: Cigarettes, Pipe, Cigars    Quit date: 08/05/1958  . Smokeless tobacco: Never Used  . Alcohol Use: No    Review of Systems   Ten systems reviewed and are negative for acute change, except as noted in the HPI.   Allergies  Codeine; Hydrocodone; and Azithromycin  Home Medications   Prior to Admission medications   Medication Sig Start  Date End Date Taking? Authorizing Provider  acetaminophen (TYLENOL) 500 MG tablet Take 1,000 mg by mouth as needed for pain.    Yes Historical Provider, MD  amoxicillin (AMOXIL) 500 MG capsule Take 1 capsule (500 mg total) by mouth 3 (three) times daily. 10/05/14  Yes Darlin Coco, MD  aspirin EC 81 MG tablet Take 81 mg by mouth every morning.   Yes Historical Provider, MD  atorvastatin (LIPITOR) 10 MG tablet TAKE 1 TABLET DAILY 04/14/14  Yes Darlin Coco, MD  b complex vitamins tablet Take 1 tablet by mouth daily.   Yes Historical Provider, MD  Cholecalciferol (VITAMIN D-3) 1000 UNITS CAPS Take 2,000 Units by mouth daily.   Yes Historical Provider, MD  CIALIS 20 MG tablet Take 20 mg by mouth daily as needed for erectile dysfunction.  09/20/11  Yes Historical Provider, MD  dextromethorphan-guaiFENesin (MUCINEX DM) 30-600 MG per 12 hr tablet Take 1 tablet by mouth 2 (two) times daily as needed for cough.    Yes Historical Provider, MD  diphenhydrAMINE (BENADRYL) 25 mg capsule Take 25 mg by mouth every 6 (six) hours as needed for allergies.    Yes Historical Provider, MD  EPINEPHrine (EPIPEN) 0.3 mg/0.3 mL SOAJ injection Inject 0.3 mLs (0.3 mg total) into the muscle once. 08/26/13  Yes Elsie Stain, MD  latanoprost (XALATAN) 0.005 % ophthalmic solution Place 1 drop into both eyes at bedtime.    Yes Historical Provider, MD  metFORMIN (GLUCOPHAGE) 500 MG tablet TAKE ONE TABLET BY MOUTH ONCE DAILY   Yes Darlin Coco, MD  metoprolol succinate (TOPROL-XL) 50 MG 24 hr tablet TAKE ONE-HALF (1/2) TABLET DAILY OR AS DIRECTED 06/15/14  Yes Darlin Coco, MD  Multiple Vitamin (MULTIVITAMIN) tablet Take 1 tablet by mouth daily.   Yes Historical Provider, MD  naproxen sodium (ANAPROX) 220 MG tablet Take 220 mg by mouth daily as needed (back pain).   Yes Historical Provider, MD  NASONEX 50 MCG/ACT nasal spray USE 2 SPRAYS IN THE NOSE DAILY   Yes Darlin Coco, MD  NIASPAN 1000 MG CR tablet Take 1  tablet (1,000 mg total) by mouth 2 (two) times daily. Patient taking differently: Take 1,000 mg by mouth daily.    Yes Darlin Coco, MD  NORVASC 10 MG tablet TAKE 1 TABLET DAILY   Yes Darlin Coco, MD  omalizumab Arvid Right) 150 MG injection Inject 150 mg into the skin every 14 (fourteen) days.   Yes Historical Provider, MD  pseudoephedrine (SUDAFED) 30 MG tablet Take 30 mg by mouth every 4 (four) hours as needed for congestion.   Yes Historical Provider, MD  spironolactone (ALDACTONE) 25 MG tablet Take 1 tablet (25 mg total) by mouth every other day. 07/26/14  Yes Darlin Coco, MD  testosterone cypionate (DEPOTESTOTERONE CYPIONATE) 100 MG/ML injection Inject 100 mg into the muscle every 14 (fourteen) days. For IM use only   Yes Historical Provider, MD  valsartan-hydrochlorothiazide (DIOVAN-HCT) 320-12.5 MG per tablet TAKE 1 TABLET DAILY 09/23/14  Yes Darlin Coco, MD  zolpidem (AMBIEN) 10 MG tablet TAKE ONE-HALF TO ONE TABLET BY MOUTH AT BEDTIME AS NEEDED FOR SLEEP 06/04/14  Yes Darlin Coco, MD  fluocinonide cream (LIDEX) 0.05 % Apply topically 2 (two) times daily as needed. Patient not taking: Reported on 10/05/2014 07/26/14   Darlin Coco, MD   BP 153/64 mmHg  Pulse 91  Temp(Src) 98.5 F (36.9 C) (Oral)  Resp 20  Ht 5\' 10"  (1.778 m)  Wt 216 lb 4.3 oz (98.1 kg)  BMI 31.03 kg/m2  SpO2 98% Physical Exam  Constitutional: He is oriented to person, place, and time. He appears well-developed and well-nourished. No distress.  HENT:  Head: Normocephalic and atraumatic.  Eyes: Conjunctivae and EOM are normal. Pupils are equal, round, and reactive to light. No scleral icterus.  Neck: Normal range of motion. Neck supple.  Cardiovascular: Normal rate, regular rhythm, normal heart sounds and intact distal pulses.  Exam reveals no gallop.   No murmur heard. Pulmonary/Chest: Effort normal and breath sounds normal. No respiratory distress. He exhibits no tenderness.  Abdominal:  Soft. Bowel sounds are normal. He exhibits no distension and no mass. There is no tenderness. There is no guarding.  Musculoskeletal: He exhibits no edema.  Neurological: He is alert and oriented to person, place, and time.  Skin: Skin is warm and dry. He is not diaphoretic.  Psychiatric: His behavior is normal.  Nursing note and vitals reviewed.   ED Course  Procedures (including critical care time) Labs Review Labs Reviewed  CBC - Abnormal; Notable for the following:    Hemoglobin 12.9 (*)    Platelets 131 (*)    All other components within normal limits  BASIC METABOLIC PANEL - Abnormal; Notable for the following:    Sodium 133 (*)    Glucose, Bld 171 (*)    BUN 30 (*)    Creatinine, Ser 1.89 (*)    GFR calc non Af Amer 32 (*)    GFR calc Af Amer 37 (*)    All other components within normal limits  URINALYSIS, ROUTINE W REFLEX MICROSCOPIC - Abnormal; Notable for the following:    Glucose, UA 100 (*)    Hgb urine dipstick MODERATE (*)    Protein, ur >300 (*)    All other components within normal limits  CBC - Abnormal; Notable for the following:    Hemoglobin 12.4 (*)    Platelets 138 (*)    All other components within normal limits  CREATININE, SERUM - Abnormal; Notable for the following:    Creatinine, Ser 1.97 (*)    GFR calc non Af Amer 31 (*)    GFR calc Af Amer 35 (*)    All other components within normal limits  BASIC METABOLIC PANEL - Abnormal; Notable for the following:    Glucose, Bld 111 (*)    BUN 27 (*)    Creatinine, Ser 1.80 (*)    GFR calc non Af Amer 34 (*)  GFR calc Af Amer 40 (*)    All other components within normal limits  TROPONIN I - Abnormal; Notable for the following:    Troponin I 0.14 (*)    All other components within normal limits  TROPONIN I - Abnormal; Notable for the following:    Troponin I 0.19 (*)    All other components within normal limits  CBG MONITORING, ED - Abnormal; Notable for the following:    Glucose-Capillary 172  (*)    All other components within normal limits  PROTIME-INR  URINE MICROSCOPIC-ADD ON  MAGNESIUM  HEMOGLOBIN A1C  TROPONIN I  D-DIMER, QUANTITATIVE  I-STAT TROPOININ, ED  POCT CBG (FASTING - GLUCOSE)-MANUAL ENTRY  I-STAT TROPOININ, ED    Imaging Review Dg Chest 2 View  10/06/2014   CLINICAL DATA:  Syncope tonight which shortness of breath  EXAM: CHEST  2 VIEW  COMPARISON:  07/02/2013  FINDINGS: Unchanged heart size and marked aortic tortuosity post CABG.  Low lung volumes interstitial crowding. There is no edema, consolidation, effusion, or pneumothorax.  There is chronic focal disc narrowing at T10-11 with subchondral erosive changes.  IMPRESSION: Stable low volume chest. No indication of acute cardiopulmonary disease.   Electronically Signed   By: Monte Fantasia M.D.   On: 10/06/2014 00:18     EKG Interpretation   Date/Time:  Wednesday October 05 2014 21:32:04 EST Ventricular Rate:  105 PR Interval:  175 QRS Duration: 168 QT Interval:  377 QTC Calculation: 498 R Axis:   -36 Text Interpretation:  Sinus tachycardia Premature ventricular complexes  Left bundle branch block Confirmed by Alvino Chapel  MD, Ovid Curd [phone]) on  10/05/2014 11:41:41 PM      MDM   Final diagnoses:  Syncope    82 year old male with significant cardiac history. His labs appear to be at baseline. Negative troponin. EKG is unchanged from previous with a left bundle branch block. Patient is having PVCs here. I suspect a cardiac cause for his syncope today.  Patient cxr without acute abnormality. Troponin is negative. Labs at baseline I have spoken with the cardiology fellow who will admit the patient for further workup. Pt stable in ED with no significant deterioration in condition.  I personally reviewed the imaging tests through PACS system. I have reviewed and interpreted Lab values. I reviewed available ER/hospitalization records through the EMR  Patient seen in shared visit with attending  physician.  Margarita Mail, PA-C 10/06/14 [place] Alvino Chapel, MD 10/06/14 [id number]

## 2014-10-06 ENCOUNTER — Other Ambulatory Visit (HOSPITAL_COMMUNITY): Payer: Self-pay

## 2014-10-06 ENCOUNTER — Inpatient Hospital Stay (HOSPITAL_COMMUNITY): Payer: Medicare Other

## 2014-10-06 DIAGNOSIS — Z951 Presence of aortocoronary bypass graft: Secondary | ICD-10-CM | POA: Diagnosis not present

## 2014-10-06 DIAGNOSIS — I252 Old myocardial infarction: Secondary | ICD-10-CM | POA: Diagnosis not present

## 2014-10-06 DIAGNOSIS — E119 Type 2 diabetes mellitus without complications: Secondary | ICD-10-CM | POA: Diagnosis present

## 2014-10-06 DIAGNOSIS — I2510 Atherosclerotic heart disease of native coronary artery without angina pectoris: Secondary | ICD-10-CM | POA: Diagnosis not present

## 2014-10-06 DIAGNOSIS — M1990 Unspecified osteoarthritis, unspecified site: Secondary | ICD-10-CM | POA: Diagnosis present

## 2014-10-06 DIAGNOSIS — H409 Unspecified glaucoma: Secondary | ICD-10-CM | POA: Diagnosis present

## 2014-10-06 DIAGNOSIS — I472 Ventricular tachycardia: Secondary | ICD-10-CM | POA: Diagnosis not present

## 2014-10-06 DIAGNOSIS — Z7982 Long term (current) use of aspirin: Secondary | ICD-10-CM | POA: Diagnosis not present

## 2014-10-06 DIAGNOSIS — R079 Chest pain, unspecified: Secondary | ICD-10-CM | POA: Diagnosis not present

## 2014-10-06 DIAGNOSIS — Z96651 Presence of right artificial knee joint: Secondary | ICD-10-CM | POA: Diagnosis present

## 2014-10-06 DIAGNOSIS — I447 Left bundle-branch block, unspecified: Secondary | ICD-10-CM | POA: Diagnosis not present

## 2014-10-06 DIAGNOSIS — R55 Syncope and collapse: Secondary | ICD-10-CM | POA: Diagnosis not present

## 2014-10-06 DIAGNOSIS — E876 Hypokalemia: Secondary | ICD-10-CM | POA: Diagnosis present

## 2014-10-06 DIAGNOSIS — N183 Chronic kidney disease, stage 3 (moderate): Secondary | ICD-10-CM | POA: Diagnosis present

## 2014-10-06 DIAGNOSIS — E785 Hyperlipidemia, unspecified: Secondary | ICD-10-CM | POA: Diagnosis present

## 2014-10-06 DIAGNOSIS — Z86010 Personal history of colonic polyps: Secondary | ICD-10-CM | POA: Diagnosis not present

## 2014-10-06 DIAGNOSIS — E780 Pure hypercholesterolemia: Secondary | ICD-10-CM | POA: Diagnosis present

## 2014-10-06 DIAGNOSIS — Z87891 Personal history of nicotine dependence: Secondary | ICD-10-CM | POA: Diagnosis not present

## 2014-10-06 DIAGNOSIS — Z4502 Encounter for adjustment and management of automatic implantable cardiac defibrillator: Secondary | ICD-10-CM | POA: Diagnosis not present

## 2014-10-06 DIAGNOSIS — I5022 Chronic systolic (congestive) heart failure: Secondary | ICD-10-CM | POA: Diagnosis present

## 2014-10-06 DIAGNOSIS — R0602 Shortness of breath: Secondary | ICD-10-CM | POA: Diagnosis not present

## 2014-10-06 DIAGNOSIS — I700 Atherosclerosis of aorta: Secondary | ICD-10-CM | POA: Diagnosis not present

## 2014-10-06 DIAGNOSIS — I129 Hypertensive chronic kidney disease with stage 1 through stage 4 chronic kidney disease, or unspecified chronic kidney disease: Secondary | ICD-10-CM | POA: Diagnosis present

## 2014-10-06 DIAGNOSIS — N400 Enlarged prostate without lower urinary tract symptoms: Secondary | ICD-10-CM | POA: Diagnosis present

## 2014-10-06 DIAGNOSIS — I255 Ischemic cardiomyopathy: Secondary | ICD-10-CM | POA: Diagnosis not present

## 2014-10-06 DIAGNOSIS — Z8249 Family history of ischemic heart disease and other diseases of the circulatory system: Secondary | ICD-10-CM | POA: Diagnosis not present

## 2014-10-06 DIAGNOSIS — I509 Heart failure, unspecified: Secondary | ICD-10-CM | POA: Diagnosis not present

## 2014-10-06 LAB — CREATININE, SERUM
Creatinine, Ser: 1.97 mg/dL — ABNORMAL HIGH (ref 0.50–1.35)
GFR calc Af Amer: 35 mL/min — ABNORMAL LOW (ref >90)
GFR calc non Af Amer: 31 mL/min — ABNORMAL LOW (ref >90)

## 2014-10-06 LAB — BASIC METABOLIC PANEL
ANION GAP: 7 (ref 5–15)
BUN: 27 mg/dL — ABNORMAL HIGH (ref 6–23)
CALCIUM: 8.5 mg/dL (ref 8.4–10.5)
CO2: 26 mmol/L (ref 19–32)
CREATININE: 1.8 mg/dL — AB (ref 0.50–1.35)
Chloride: 104 mmol/L (ref 96–112)
GFR calc Af Amer: 40 mL/min — ABNORMAL LOW (ref >90)
GFR calc non Af Amer: 34 mL/min — ABNORMAL LOW (ref >90)
Glucose, Bld: 111 mg/dL — ABNORMAL HIGH (ref 70–99)
Potassium: 4 mmol/L (ref 3.5–5.1)
Sodium: 137 mmol/L (ref 135–145)

## 2014-10-06 LAB — GLUCOSE, CAPILLARY
GLUCOSE-CAPILLARY: 94 mg/dL (ref 70–99)
Glucose-Capillary: 93 mg/dL (ref 70–99)

## 2014-10-06 LAB — CBC
HCT: 40.1 % (ref 39.0–52.0)
Hemoglobin: 12.4 g/dL — ABNORMAL LOW (ref 13.0–17.0)
MCH: 26.8 pg (ref 26.0–34.0)
MCHC: 30.9 g/dL (ref 30.0–36.0)
MCV: 86.8 fL (ref 78.0–100.0)
PLATELETS: 138 10*3/uL — AB (ref 150–400)
RBC: 4.62 MIL/uL (ref 4.22–5.81)
RDW: 15 % (ref 11.5–15.5)
WBC: 6.7 10*3/uL (ref 4.0–10.5)

## 2014-10-06 LAB — TROPONIN I
Troponin I: 0.14 ng/mL — ABNORMAL HIGH (ref <0.031)
Troponin I: 0.19 ng/mL — ABNORMAL HIGH (ref <0.031)
Troponin I: 0.16 ng/mL — ABNORMAL HIGH (ref <0.031)

## 2014-10-06 LAB — D-DIMER, QUANTITATIVE: D-Dimer, Quant: 1.45 ug/mL-FEU — ABNORMAL HIGH (ref 0.00–0.48)

## 2014-10-06 LAB — MAGNESIUM: MAGNESIUM: 2 mg/dL (ref 1.5–2.5)

## 2014-10-06 MED ORDER — ASPIRIN 300 MG RE SUPP: 300.0000 mg | RECTAL | Status: AC

## 2014-10-06 MED ORDER — VALSARTAN-HYDROCHLOROTHIAZIDE 320-12.5 MG PO TABS: 1.0000 | ORAL_TABLET | Freq: Every day | ORAL | Status: DC

## 2014-10-06 MED ORDER — ZOLPIDEM TARTRATE 5 MG PO TABS
10.0000 mg | ORAL_TABLET | Freq: Every evening | ORAL | Status: DC | PRN
Start: 2014-10-06 — End: 2014-10-11
  Administered 2014-10-07: 5 mg via ORAL
  Administered 2014-10-08 – 2014-10-10 (×3): 10 mg via ORAL
  Filled 2014-10-06 (×4): qty 2

## 2014-10-06 MED ORDER — SPIRONOLACTONE 25 MG PO TABS
25.0000 mg | ORAL_TABLET | ORAL | Status: DC
  Administered 2014-10-06 – 2014-10-10 (×3): 25 mg via ORAL
  Filled 2014-10-06 (×3): qty 1

## 2014-10-06 MED ORDER — HYDROCHLOROTHIAZIDE 12.5 MG PO CAPS
12.5000 mg | ORAL_CAPSULE | Freq: Every day | ORAL | Status: DC
  Administered 2014-10-06 – 2014-10-11 (×6): 12.5 mg via ORAL
  Filled 2014-10-06 (×6): qty 1

## 2014-10-06 MED ORDER — ATORVASTATIN CALCIUM 10 MG PO TABS
10.0000 mg | ORAL_TABLET | Freq: Every day | ORAL | Status: DC
  Administered 2014-10-06 – 2014-10-11 (×6): 10 mg via ORAL
  Filled 2014-10-06 (×6): qty 1

## 2014-10-06 MED ORDER — ASPIRIN EC 81 MG PO TBEC
81.0000 mg | DELAYED_RELEASE_TABLET | Freq: Every day | ORAL | Status: DC
  Administered 2014-10-07 – 2014-10-11 (×5): 81 mg via ORAL
  Filled 2014-10-06 (×5): qty 1

## 2014-10-06 MED ORDER — AMOXICILLIN 500 MG PO CAPS
500.0000 mg | ORAL_CAPSULE | Freq: Three times a day (TID) | ORAL | Status: DC
  Administered 2014-10-06 – 2014-10-11 (×15): 500 mg via ORAL
  Filled 2014-10-06 (×18): qty 1

## 2014-10-06 MED ORDER — METOPROLOL TARTRATE 25 MG PO TABS
25.0000 mg | ORAL_TABLET | Freq: Two times a day (BID) | ORAL | Status: DC
  Administered 2014-10-06 (×2): 25 mg via ORAL
  Filled 2014-10-06 (×4): qty 1

## 2014-10-06 MED ORDER — HEPARIN SODIUM (PORCINE) 5000 UNIT/ML IJ SOLN
5000.0000 [IU] | Freq: Three times a day (TID) | INTRAMUSCULAR | Status: DC
  Administered 2014-10-06 – 2014-10-11 (×15): 5000 [IU] via SUBCUTANEOUS
  Filled 2014-10-06 (×20): qty 1

## 2014-10-06 MED ORDER — IRBESARTAN 300 MG PO TABS
300.0000 mg | ORAL_TABLET | Freq: Every day | ORAL | Status: DC
  Administered 2014-10-06 – 2014-10-11 (×6): 300 mg via ORAL
  Filled 2014-10-06 (×6): qty 1

## 2014-10-06 MED ORDER — POTASSIUM CHLORIDE CRYS ER 20 MEQ PO TBCR
40.0000 meq | EXTENDED_RELEASE_TABLET | Freq: Once | ORAL | Status: AC
  Administered 2014-10-06: 40 meq via ORAL
  Filled 2014-10-06: qty 2

## 2014-10-06 MED ORDER — ACETAMINOPHEN 325 MG PO TABS
650.0000 mg | ORAL_TABLET | ORAL | Status: DC | PRN
  Administered 2014-10-06 – 2014-10-08 (×4): 650 mg via ORAL
  Filled 2014-10-06 (×4): qty 2

## 2014-10-06 MED ORDER — NITROGLYCERIN 0.4 MG SL SUBL: 0.4000 mg | SUBLINGUAL_TABLET | SUBLINGUAL | Status: DC | PRN

## 2014-10-06 MED ORDER — TECHNETIUM TO 99M ALBUMIN AGGREGATED
6.0000 | Freq: Once | INTRAVENOUS | Status: AC | PRN
  Administered 2014-10-06: 6 via INTRAVENOUS

## 2014-10-06 MED ORDER — ONDANSETRON HCL 4 MG/2ML IJ SOLN: 4.0000 mg | Freq: Four times a day (QID) | INTRAMUSCULAR | Status: DC | PRN

## 2014-10-06 MED ORDER — TECHNETIUM TC 99M DIETHYLENETRIAME-PENTAACETIC ACID: 40.0000 | Freq: Once | INTRAVENOUS | Status: AC | PRN

## 2014-10-06 MED ORDER — AZELASTINE HCL 0.1 % NA SOLN
2.0000 | Freq: Two times a day (BID) | NASAL | Status: DC
  Administered 2014-10-06 – 2014-10-11 (×9): 2 via NASAL
  Filled 2014-10-06 (×2): qty 30

## 2014-10-06 MED ORDER — ASPIRIN 81 MG PO CHEW: 324.0000 mg | CHEWABLE_TABLET | ORAL | Status: AC

## 2014-10-06 NOTE — Progress Notes (Signed)
UR COMPLETED  

## 2014-10-06 NOTE — Progress Notes (Signed)
Patient seen and examined. The fellow note reviewed. Admitted with syncopal episode. There was preceding dyspnea. Troponin minimally elevated. No risk factors for pulmonary embolus but will check d-dimer. Continue to cycle enzymes. Await echocardiogram. Electrocardiogram shows sinus rhythm with PVCs and left bundle branch block. Could potentially be bradycardia mediated or related to ventricular arrhythmia. If LV function less than 35% one need EP consult. May also need nuclear study depending on troponin trend. Shane [name]

## 2014-10-06 NOTE — Progress Notes (Signed)
VQ neg for PE. Per d/w Dr. Stanford Breed, patient is not a dc today. Still under observation to determine further plan based on MD review of echo. Labs ordered for AM. Melina Copa PA-C

## 2014-10-06 NOTE — H&P (Signed)
Patient ID: Shane [name] MRN: [id number], DOB/AGE: 1933/10/16   Admit date: 10/05/2014   Primary Physician: Darlin Coco, MD Primary Cardiologist: Darlin Coco, MD  Pt. Profile:  42M with CAD s/p remote CABG, mild LVSD (EF 45% Dec 2014), DM, CKD III, and atypical LBBB on ECG who p/w unheralded syncope.   Problem List  Past Medical History  Diagnosis Date  . Coronary artery disease     remote CABG in 1985, cath in 2003 by Dr. Lia Foyer with no PCI, last nuclear in 2010 showing scar and EF of 28%.   . Dyslipidemia   . Hypercholesterolemia   . Left ventricular dysfunction     28% per nuclear in 2010 and 35 to 40% per echo in 2010  . HTN (hypertension)   . Diabetes mellitus   . Cellulitis of arm, left     MSSA  . Spinal stenosis   . BPH (benign prostatic hyperplasia)   . Allergic rhinitis 01-08-13    Uses nebulizer for chronic sinus issues and Mucinex.  . Abnormal nuclear cardiac imaging test Nov 2010    moderate area of infarct in the inferior wall with only minimal reversibility and EF of 28%  . Myocardial infarction     1985  . Pneumonia     hx of several times years ago   . Blood transfusion     ? at time of bypass surgery   . Arthritis     osteoarthritis   . Colon polyps     adenomatous  . Neuromuscular disorder     legs mild paralysis-able to walk"nerve damage"-legs- left leg brace   . CKD (chronic kidney disease), stage III     "was told due to meds he takes"-no Renal workup done  . Glaucoma 01-08-13    tx. eye drops    Past Surgical History  Procedure Laterality Date  . Cardiac catheterization  2003  . Lumbar disc surgery    . Prostate surgery    . Back surgery      hx of back surgery x 4   . Total knee arthroplasty  08/01/2011    Procedure: TOTAL KNEE ARTHROPLASTY;  Surgeon: Johnn Hai;  Location: [facility];  Service: Orthopedics;  Laterality: Right;  . Cataract       recent cataract surgery 6'14  . Coronary artery bypass graft  1985    x 5  vessels  . Colonoscopy N/A 01/25/2013    Procedure: COLONOSCOPY;  Surgeon: Irene Shipper, MD;  Location: [facility];  Service: Endoscopy;  Laterality: N/A;     Allergies  Allergies  Allergen Reactions  . Codeine     anxiety  . Hydrocodone     anxiety  . Azithromycin Rash    HPI  42M with CAD s/p remote CABG, mild LVSD (EF 45% Dec 2014), DM, CKD III, and atypical LBBB on ECG who p/w unheralded syncope.   [name] reports he has had a sinus infection which was treated with amox, an antihistimine, and for the past 2 days, sudafed. He was feeling pretty well today when he laid down and abruptly felt acute onset SOB without CP, palps, lightneadedness, or dizziness. Then "everythign went black" and he had a 1 minute LOC (per wife). No tonic clonic jerks or loss of continence. Wife noticed the patient's "face was hot...arms and legs were clammy." Very quickly he felt back to his normal self. He presented to the ED for evaluation.   On arrival to  the ED he was hemodynamically stable: 162/76, P 107 (ST), 94% on RA. CXR was without evidence of an acute process. Labs were notable for K 3.7, Na 133, Cr 1.89 (~baseline), POC TnI 0.01 and 0.03. ECG demonstrated NSR. atypical LBBB. occasional PVCs and a PAC. Unchanged compared to 07/26/14  He had one previous syncopal event 8-10 years ago.   Home Medications  Prior to Admission medications   Medication Sig Start Date End Date Taking? Authorizing Provider  acetaminophen (TYLENOL) 500 MG tablet Take 1,000 mg by mouth as needed for pain.    Yes Historical Provider, MD  amoxicillin (AMOXIL) 500 MG capsule Take 1 capsule (500 mg total) by mouth 3 (three) times daily. 10/05/14  Yes Darlin Coco, MD  aspirin EC 81 MG tablet Take 81 mg by mouth every morning.   Yes Historical Provider, MD  atorvastatin (LIPITOR) 10 MG tablet TAKE 1 TABLET DAILY 04/14/14  Yes Darlin Coco, MD  b complex vitamins tablet Take 1 tablet by mouth daily.   Yes  Historical Provider, MD  Cholecalciferol (VITAMIN D-3) 1000 UNITS CAPS Take 2,000 Units by mouth daily.   Yes Historical Provider, MD  CIALIS 20 MG tablet Take 20 mg by mouth daily as needed for erectile dysfunction.  09/20/11  Yes Historical Provider, MD  dextromethorphan-guaiFENesin (MUCINEX DM) 30-600 MG per 12 hr tablet Take 1 tablet by mouth 2 (two) times daily as needed for cough.    Yes Historical Provider, MD  diphenhydrAMINE (BENADRYL) 25 mg capsule Take 25 mg by mouth every 6 (six) hours as needed for allergies.    Yes Historical Provider, MD  EPINEPHrine (EPIPEN) 0.3 mg/0.3 mL SOAJ injection Inject 0.3 mLs (0.3 mg total) into the muscle once. 08/26/13  Yes Elsie Stain, MD  latanoprost (XALATAN) 0.005 % ophthalmic solution Place 1 drop into both eyes at bedtime.    Yes Historical Provider, MD  metFORMIN (GLUCOPHAGE) 500 MG tablet TAKE ONE TABLET BY MOUTH ONCE DAILY   Yes Darlin Coco, MD  metoprolol succinate (TOPROL-XL) 50 MG 24 hr tablet TAKE ONE-HALF (1/2) TABLET DAILY OR AS DIRECTED 06/15/14  Yes Darlin Coco, MD  Multiple Vitamin (MULTIVITAMIN) tablet Take 1 tablet by mouth daily.   Yes Historical Provider, MD  naproxen sodium (ANAPROX) 220 MG tablet Take 220 mg by mouth daily as needed (back pain).   Yes Historical Provider, MD  NASONEX 50 MCG/ACT nasal spray USE 2 SPRAYS IN THE NOSE DAILY   Yes Darlin Coco, MD  NIASPAN 1000 MG CR tablet Take 1 tablet (1,000 mg total) by mouth 2 (two) times daily. Patient taking differently: Take 1,000 mg by mouth daily.    Yes Darlin Coco, MD  NORVASC 10 MG tablet TAKE 1 TABLET DAILY   Yes Darlin Coco, MD  omalizumab Arvid Right) 150 MG injection Inject 150 mg into the skin every 14 (fourteen) days.   Yes Historical Provider, MD  pseudoephedrine (SUDAFED) 30 MG tablet Take 30 mg by mouth every 4 (four) hours as needed for congestion.   Yes Historical Provider, MD  spironolactone (ALDACTONE) 25 MG tablet Take 1 tablet (25 mg  total) by mouth every other day. 07/26/14  Yes Darlin Coco, MD  testosterone cypionate (DEPOTESTOTERONE CYPIONATE) 100 MG/ML injection Inject 100 mg into the muscle every 14 (fourteen) days. For IM use only   Yes Historical Provider, MD  valsartan-hydrochlorothiazide (DIOVAN-HCT) 320-12.5 MG per tablet TAKE 1 TABLET DAILY 09/23/14  Yes Darlin Coco, MD  zolpidem (AMBIEN) 10 MG tablet TAKE ONE-HALF TO  ONE TABLET BY MOUTH AT BEDTIME AS NEEDED FOR SLEEP 06/04/14  Yes Darlin Coco, MD  fluocinonide cream (LIDEX) 0.05 % Apply topically 2 (two) times daily as needed. Patient not taking: Reported on 10/05/2014 07/26/14   Darlin Coco, MD    Family History  Family History  Problem Relation Age of Onset  . Heart attack Father   . Heart disease Father   . Colon polyps Father   . Diabetes Sister     x 2  . Heart disease Brother     x 2  . Lung cancer Mother   . Bone cancer Brother     Social History  History   Social History  . Marital Status: Married    Spouse Name: N/A  . Number of Children: 2  . Years of Education: N/A   Occupational History  . pastor    Social History Main Topics  . Smoking status: Former Smoker -- 1.00 packs/day for 5 years    Types: Cigarettes, Pipe, Cigars    Quit date: 08/05/1958  . Smokeless tobacco: Never Used  . Alcohol Use: No  . Drug Use: No  . Sexual Activity: Not Currently   Other Topics Concern  . Not on file   Social History Narrative     Review of Systems General:  No chills, fever, night sweats or weight changes.  Cardiovascular:  No chest pain, dyspnea on exertion, edema, orthopnea, palpitations, paroxysmal nocturnal dyspnea. Dermatological: No rash, lesions/masses Respiratory: No cough Urologic: No hematuria, dysuria Abdominal:   No nausea, vomiting, diarrhea, bright red blood per rectum, melena, or hematemesis Neurologic:  No visual changes, wkns, changes in mental status. All other systems reviewed and are otherwise  negative except as noted above.  Physical Exam  Blood pressure 129/77, pulse 93, temperature 98.1 F (36.7 C), temperature source Oral, resp. rate 25, height 5\' 10"  (1.778 m), weight 96.163 kg (212 lb), SpO2 97 %.  General: Pleasant, NAD, sweaty Psych: Normal affect. Neuro: Alert and oriented X 3. Moves all extremities spontaneously. HEENT: Normal  Neck: Supple without bruits or JVD. Lungs:  Resp regular and unlabored, CTA. Heart: RRR no s3, s4, or murmurs. Abdomen: Soft, non-tender, non-distended, BS + x 4.  Extremities: No clubbing, cyanosis. Trace LE edema. DP/PT/Radials 2+ and equal bilaterally.  Labs  Troponin [facility] Test)  Recent Labs  10/05/14 2302  TROPIPOC 0.03   No results for input(s): CKTOTAL, CKMB, TROPONINI in the last 72 hours. Lab Results  Component Value Date   WBC 6.3 10/05/2014   HGB 12.9* 10/05/2014   HCT 41.1 10/05/2014   MCV 86.2 10/05/2014   PLT 131* 10/05/2014    Recent Labs Lab 10/05/14 2153  NA 133*  K 3.7  CL 102  CO2 22  BUN 30*  CREATININE 1.89*  CALCIUM 8.5  GLUCOSE 171*   Lab Results  Component Value Date   CHOL 105 07/26/2014   HDL 24.10* 07/26/2014   LDLCALC 61 07/26/2014   TRIG 102.0 07/26/2014   No results found for: DDIMER   Radiology/Studies  Dg Chest 2 View  10/06/2014   CLINICAL DATA:  Syncope tonight which shortness of breath  EXAM: CHEST  2 VIEW  COMPARISON:  07/02/2013  FINDINGS: Unchanged heart size and marked aortic tortuosity post CABG.  Low lung volumes interstitial crowding. There is no edema, consolidation, effusion, or pneumothorax.  There is chronic focal disc narrowing at T10-11 with subchondral erosive changes.  IMPRESSION: Stable low volume chest. No indication of  acute cardiopulmonary disease.   Electronically Signed   By: Monte Fantasia M.D.   On: 10/06/2014 00:18    ECG  10/05/14 @ 21:32 NSR. atypical LBBB. occasional PVCs and a PAC. Unchanged compared to 07/26/14  TTE 07/23/13 Left  ventricle: Akinesis of the base inferior, base/mid inferolateral, and base inferoseptal segments. The cavity size was moderately dilated (may be localized to the base because of the akinetic segments). Wall thickness was increased in a pattern of mild LVH. The estimated ejection fraction was 45%. Doppler parameters are consistent with abnormal left ventricular relaxation (grade 1 diastolic dysfunction). - Mitral valve: Mild regurgitation. - Left atrium: The atrium was mildly dilated. - Right ventricle: Not able to assess RV function due to poor visualization. Not able to assess RV size due to poor visualization. RV does not seem to be dilated.  ASSESSMENT AND PLAN  37M with CAD s/p remote CABG, mild LVSD (EF 45% Dec 2014), DM, CKD III, and atypical LBBB on ECG who p/w unheralded syncope.   Given risk factors, he is at high risk for having a brady or tachy event causing syncope. He has fairly frequent PVCs and had been using sudafed, raising the question of VT. Given normal rate on tele now, will continue BB given risk for VT as cause of syncope. Will switch metop to short acting formulation in the event that bradycardia becomes evident on tele.   -valsartan hctz 320/12.5 -spiro 25mg  -metoprolol 50mg  daily convert to 25mg  BID - atorva 10 - norvasc  - ASA 81mg  -cycle troponins - K>4, Mg > 2 - TTE - EP consult for possible EP study   Signed, Lamar Sprinkles, MD 10/06/2014, 12:48 AM

## 2014-10-06 NOTE — Progress Notes (Signed)
  Echocardiogram 2D Echocardiogram has been performed.  Diamond Nickel 10/06/2014, 2:45 PM

## 2014-10-06 NOTE — ED Notes (Signed)
Attempted report 

## 2014-10-07 DIAGNOSIS — I255 Ischemic cardiomyopathy: Secondary | ICD-10-CM

## 2014-10-07 LAB — GLUCOSE, CAPILLARY
GLUCOSE-CAPILLARY: 109 mg/dL — AB (ref 70–99)
GLUCOSE-CAPILLARY: 142 mg/dL — AB (ref 70–99)
Glucose-Capillary: 97 mg/dL (ref 70–99)

## 2014-10-07 LAB — CBC
HCT: 40.5 % (ref 39.0–52.0)
Hemoglobin: 12.7 g/dL — ABNORMAL LOW (ref 13.0–17.0)
MCH: 26.8 pg (ref 26.0–34.0)
MCHC: 31.4 g/dL (ref 30.0–36.0)
MCV: 85.6 fL (ref 78.0–100.0)
Platelets: 144 10*3/uL — ABNORMAL LOW (ref 150–400)
RBC: 4.73 MIL/uL (ref 4.22–5.81)
RDW: 15 % (ref 11.5–15.5)
WBC: 8.4 10*3/uL (ref 4.0–10.5)

## 2014-10-07 LAB — BASIC METABOLIC PANEL
ANION GAP: 4 — AB (ref 5–15)
BUN: 25 mg/dL — ABNORMAL HIGH (ref 6–23)
CHLORIDE: 106 mmol/L (ref 96–112)
CO2: 26 mmol/L (ref 19–32)
Calcium: 8.5 mg/dL (ref 8.4–10.5)
Creatinine, Ser: 1.79 mg/dL — ABNORMAL HIGH (ref 0.50–1.35)
GFR calc Af Amer: 40 mL/min — ABNORMAL LOW (ref >90)
GFR calc non Af Amer: 34 mL/min — ABNORMAL LOW (ref >90)
Glucose, Bld: 108 mg/dL — ABNORMAL HIGH (ref 70–99)
POTASSIUM: 4.6 mmol/L (ref 3.5–5.1)
Sodium: 136 mmol/L (ref 135–145)

## 2014-10-07 LAB — MAGNESIUM: MAGNESIUM: 2.1 mg/dL (ref 1.5–2.5)

## 2014-10-07 LAB — HEMOGLOBIN A1C
Hgb A1c MFr Bld: 6 % — ABNORMAL HIGH (ref 4.8–5.6)
MEAN PLASMA GLUCOSE: 126 mg/dL

## 2014-10-07 MED ORDER — METOPROLOL SUCCINATE ER 50 MG PO TB24
50.0000 mg | ORAL_TABLET | Freq: Every day | ORAL | Status: DC
  Administered 2014-10-07 – 2014-10-09 (×3): 50 mg via ORAL
  Filled 2014-10-07 (×4): qty 1

## 2014-10-07 MED ORDER — LATANOPROST 0.005 % OP SOLN
1.0000 [drp] | Freq: Every day | OPHTHALMIC | Status: DC
  Administered 2014-10-08 – 2014-10-10 (×3): 1 [drp] via OPHTHALMIC
  Filled 2014-10-07 (×2): qty 2.5

## 2014-10-07 MED ORDER — CHLORHEXIDINE GLUCONATE 4 % EX LIQD
60.0000 mL | Freq: Once | CUTANEOUS | Status: AC
  Administered 2014-10-09: 4 via TOPICAL
  Filled 2014-10-07: qty 60

## 2014-10-07 MED ORDER — CHLORHEXIDINE GLUCONATE 4 % EX LIQD
60.0000 mL | Freq: Once | CUTANEOUS | Status: AC
  Administered 2014-10-10: 4 via TOPICAL
  Filled 2014-10-07: qty 60

## 2014-10-07 MED ORDER — SODIUM CHLORIDE 0.9 % IV SOLN: INTRAVENOUS | Status: DC

## 2014-10-07 MED ORDER — GENTAMICIN SULFATE 40 MG/ML IJ SOLN
80.0000 mg | INTRAMUSCULAR | Status: DC
  Filled 2014-10-07: qty 2

## 2014-10-07 MED ORDER — CEFAZOLIN SODIUM-DEXTROSE 2-3 GM-% IV SOLR
2.0000 g | INTRAVENOUS | Status: DC
  Filled 2014-10-07: qty 50

## 2014-10-07 NOTE — Progress Notes (Signed)
    Subjective:  Denies CP or dyspnea; congested   Objective:  Filed Vitals:   10/06/14 0331 10/06/14 1304 10/06/14 2012 10/07/14 0543  BP: 153/64 140/82 151/85 157/76  Pulse: 91 81 86 89  Temp: 98.5 F (36.9 C) 98.3 F (36.8 C) 98.1 F (36.7 C) 98.3 F (36.8 C)  TempSrc: Oral Oral Oral Oral  Resp: 20 19 18 18   Height: 5\' 10"  (1.778 m)     Weight: 216 lb 4.3 oz (98.1 kg)     SpO2: 98% 95% 97% 97%    Intake/Output from previous day:  Intake/Output Summary (Last 24 hours) at 10/07/14 [id number] Last data filed at 10/06/14 1822  Gross per 24 hour  Intake    696 ml  Output      0 ml  Net    696 ml    Physical Exam: Physical exam: Well-developed well-nourished in no acute distress.  Skin is warm and dry.  HEENT is normal.  Neck is supple.  Chest is clear to auscultation with normal expansion.  Cardiovascular exam is regular rate and rhythm.  Abdominal exam nontender or distended. No masses palpated. Extremities show no edema. neuro grossly intact    Lab Results: Basic Metabolic Panel:  Recent Labs  10/06/14 0239 10/06/14 0815 10/07/14 0330  NA  --  137 136  K  --  4.0 4.6  CL  --  104 106  CO2  --  26 26  GLUCOSE  --  111* 108*  BUN  --  27* 25*  CREATININE 1.97* 1.80* 1.79*  CALCIUM  --  8.5 8.5  MG 2.0  --  2.1   CBC:  Recent Labs  10/06/14 0239 10/07/14 0330  WBC 6.7 8.4  HGB 12.4* 12.7*  HCT 40.1 40.5  MCV 86.8 85.6  PLT 138* 144*   Cardiac Enzymes:  Recent Labs  10/06/14 0239 10/06/14 0815 10/06/14 1430  TROPONINI 0.14* 0.19* 0.16*     Assessment/Plan:  1 syncope-etiology unclear. Telemetry reviewed and there is occasional nonsustained ventricular tachycardia. Ejection fraction 35-40%. Electrocardiogram shows baseline left bundle branch block. I'm concerned about the possibility of bradycardia mediated events or ventricular arrhythmia. I will ask electrophysiology to review. Question implantable loop monitor. Patient instructed not to  drive for 6 months. 2 dyspnea with minimally elevated troponin-VQ scan negative. Minimal elevation in troponin with no clear trend up. Possibly secondary to renal insufficiency. Will arrange nuclear study tomorrow morning. 3 ischemic cardiomyopathy-continue ARB. Blood pressure is elevated. Increase Toprol to 50 mg daily.  4 hyperlipidemia-continue statin. 5 coronary artery disease 6 chronic stage III renal failure-Follow renal function.  Kirk Ruths 10/07/2014, 7:28 AM

## 2014-10-07 NOTE — Consult Note (Signed)
Cardiologist:   Reason for Consult: Syncope Referring Physician:  Horris [name] is an 81 y.o. male.  HPI:   The patient is a 81 yo male with a history of CAD, remote CABG in 1985, cath in 2003 by Dr. Lia Foyer with no PCI, last nuclear in 2010 showing scar and EF of 28%.  Echo yesterday reveals EF 35-40%, Akinesis and scarring of the basal-midinferolateral, inferior, and inferoseptal myocardium.  PA pressure 24mHg. LA moderately dilated.  Ef appears a little down, but akinesis region the same.   His history also includes dyslipidemia, HTN, DM, CKD III.  He presented on 10/05/14 with syncope.  He had a sinus infection which was treated with amoxicillin and antihistamine, sudafed.  Last dose of sudafed was around 1300hrs. He laid down at 2000hrs and felt SOB.  Everything then went black.  He was out for about a minute.  He was a little sweaty after but otherwise felt his normal self.  The patient denied having nausea, vomiting, fever, chest pain, orthopnea, dizziness, PND, cough, congestion, abdominal pain, hematochezia, melena, lower extremity edema, claudication.  EP asked to see for possible loop recorder implant.   Pt has prior hx of syncope about 2 years ago, this with some prodrome, also brief and like this episode, wtihout residual orthostatic intolerance  NO hx of palpiations  Functional status, class 2  DOE < 1/4 mile, thinks he can walk stairs  Sleep disordered breathing with daytime somnolence  Troponin 0.19, Mag 2.0, K 3.7 VQ negative for PE.      Past Medical History  Diagnosis Date  . Coronary artery disease     remote CABG in 1985, cath in 2003 by Dr. SLia Foyerwith no PCI, last nuclear in 2010 showing scar and EF of 28%.   . Dyslipidemia   . Hypercholesterolemia   . Left ventricular dysfunction     28% per nuclear in 2010 and 35 to 40% per echo in 2010  . HTN (hypertension)   . Diabetes mellitus   . Cellulitis of arm, left     MSSA  . Spinal stenosis   .  BPH (benign prostatic hyperplasia)   . Allergic rhinitis 01-08-13    Uses nebulizer for chronic sinus issues and Mucinex.  . Abnormal nuclear cardiac imaging test Nov 2010    moderate area of infarct in the inferior wall with only minimal reversibility and EF of 28%  . Myocardial infarction     1985  . Pneumonia     hx of several times years ago   . Blood transfusion     ? at time of bypass surgery   . Arthritis     osteoarthritis   . Colon polyps     adenomatous  . Neuromuscular disorder     legs mild paralysis-able to walk"nerve damage"-legs- left leg brace   . CKD (chronic kidney disease), stage III     "was told due to meds he takes"-no Renal workup done  . Glaucoma 01-08-13    tx. eye drops    Past Surgical History  Procedure Laterality Date  . Cardiac catheterization  2003  . Lumbar disc surgery    . Prostate surgery    . Back surgery      hx of back surgery x 4   . Total knee arthroplasty  08/01/2011    Procedure: TOTAL KNEE ARTHROPLASTY;  Surgeon: JJohnn Hai  Location: [facility];  Service: Orthopedics;  Laterality: Right;  .  Cataract       recent cataract surgery 6'14  . Coronary artery bypass graft  1985    x 5 vessels  . Colonoscopy N/A 01/25/2013    Procedure: COLONOSCOPY;  Surgeon: Irene Shipper, MD;  Location: [facility];  Service: Endoscopy;  Laterality: N/A;    Family History  Problem Relation Age of Onset  . Heart attack Father   . Heart disease Father   . Colon polyps Father   . Diabetes Sister     x 2  . Heart disease Brother     x 2  . Lung cancer Mother   . Bone cancer Brother     Social History:  reports that he quit smoking about 56 years ago. His smoking use included Cigarettes, Pipe, and Cigars. He has a 5 pack-year smoking history. He has never used smokeless tobacco. He reports that he does not drink alcohol or use illicit drugs.  Allergies:  Allergies  Allergen Reactions  . Codeine     anxiety  . Hydrocodone     anxiety  .  Azithromycin Rash    Medications: Scheduled Meds: . amoxicillin  500 mg Oral TID  . aspirin EC  81 mg Oral Daily  . atorvastatin  10 mg Oral Daily  . azelastine  2 spray Each Nare BID  . heparin  5,000 Units Subcutaneous 3 times per day  . irbesartan  300 mg Oral Daily   And  . hydrochlorothiazide  12.5 mg Oral Daily  . metoprolol succinate  50 mg Oral Daily  . spironolactone  25 mg Oral QODAY      Medication List    ASK your doctor about these medications        acetaminophen 500 MG tablet  Commonly known as:  TYLENOL  Take 1,000 mg by mouth as needed for pain.     amoxicillin 500 MG capsule  Commonly known as:  AMOXIL  Take 1 capsule (500 mg total) by mouth 3 (three) times daily.     aspirin EC 81 MG tablet  Take 81 mg by mouth every morning.     atorvastatin 10 MG tablet  Commonly known as:  LIPITOR  TAKE 1 TABLET DAILY     b complex vitamins tablet  Take 1 tablet by mouth daily.     CIALIS 20 MG tablet  Generic drug:  tadalafil  Take 20 mg by mouth daily as needed for erectile dysfunction.     dextromethorphan-guaiFENesin 30-600 MG per 12 hr tablet  Commonly known as:  MUCINEX DM  Take 1 tablet by mouth 2 (two) times daily as needed for cough.     diphenhydrAMINE 25 mg capsule  Commonly known as:  BENADRYL  Take 25 mg by mouth every 6 (six) hours as needed for allergies.     EPINEPHrine 0.3 mg/0.3 mL Soaj injection  Commonly known as:  EPIPEN  Inject 0.3 mLs (0.3 mg total) into the muscle once.     fluocinonide cream 0.05 %  Commonly known as:  LIDEX  Apply topically 2 (two) times daily as needed.     latanoprost 0.005 % ophthalmic solution  Commonly known as:  XALATAN  Place 1 drop into both eyes at bedtime.     metFORMIN 500 MG tablet  Commonly known as:  GLUCOPHAGE  TAKE ONE TABLET BY MOUTH ONCE DAILY     metoprolol succinate 50 MG 24 hr tablet  Commonly known as:  TOPROL-XL  TAKE ONE-HALF (1/2) TABLET DAILY  OR AS DIRECTED      multivitamin tablet  Take 1 tablet by mouth daily.     naproxen sodium 220 MG tablet  Commonly known as:  ANAPROX  Take 220 mg by mouth daily as needed (back pain).     NASONEX 50 MCG/ACT nasal spray  Generic drug:  mometasone  USE 2 SPRAYS IN THE NOSE DAILY     NIASPAN 1000 MG CR tablet  Generic drug:  niacin  Take 1 tablet (1,000 mg total) by mouth 2 (two) times daily.     NORVASC 10 MG tablet  Generic drug:  amLODipine  TAKE 1 TABLET DAILY     omalizumab 150 MG injection  Commonly known as:  XOLAIR  Inject 150 mg into the skin every 14 (fourteen) days.     pseudoephedrine 30 MG tablet  Commonly known as:  SUDAFED  Take 30 mg by mouth every 4 (four) hours as needed for congestion.     spironolactone 25 MG tablet  Commonly known as:  ALDACTONE  Take 1 tablet (25 mg total) by mouth every other day.     testosterone cypionate 100 MG/ML injection  Commonly known as:  DEPOTESTOTERONE CYPIONATE  Inject 100 mg into the muscle every 14 (fourteen) days. For IM use only     valsartan-hydrochlorothiazide 320-12.5 MG per tablet  Commonly known as:  DIOVAN-HCT  TAKE 1 TABLET DAILY     Vitamin D-3 1000 UNITS Caps  Take 2,000 Units by mouth daily.     zolpidem 10 MG tablet  Commonly known as:  AMBIEN  TAKE ONE-HALF TO ONE TABLET BY MOUTH AT BEDTIME AS NEEDED FOR SLEEP        Continuous Infusions:  PRN Meds:.acetaminophen, nitroGLYCERIN, ondansetron (ZOFRAN) IV, zolpidem   Results for orders placed or performed during the hospital encounter of 10/05/14 (from the past 48 hour(s))  CBC  (at [facility])     Status: Abnormal   Collection Time: 10/05/14  9:53 PM  Result Value Ref Range   WBC 6.3 4.0 - 10.5 K/uL   RBC 4.77 4.22 - 5.81 MIL/uL   Hemoglobin 12.9 (L) 13.0 - 17.0 g/dL   HCT 41.1 39.0 - 52.0 %   MCV 86.2 78.0 - 100.0 fL   MCH 27.0 26.0 - 34.0 pg   MCHC 31.4 30.0 - 36.0 g/dL   RDW 14.7 11.5 - 15.5 %   Platelets 131 (L) 150 - 400 K/uL  Basic metabolic  panel  (at [facility])     Status: Abnormal   Collection Time: 10/05/14  9:53 PM  Result Value Ref Range   Sodium 133 (L) 135 - 145 mmol/L   Potassium 3.7 3.5 - 5.1 mmol/L   Chloride 102 96 - 112 mmol/L   CO2 22 19 - 32 mmol/L   Glucose, Bld 171 (H) 70 - 99 mg/dL   BUN 30 (H) 6 - 23 mg/dL   Creatinine, Ser 1.89 (H) 0.50 - 1.35 mg/dL   Calcium 8.5 8.4 - 10.5 mg/dL   GFR calc non Af Amer 32 (L) >90 mL/min   GFR calc Af Amer 37 (L) >90 mL/min    Comment: (NOTE) The eGFR has been calculated using the CKD EPI equation. This calculation has not been validated in all clinical situations. eGFR's persistently <90 mL/min signify possible Chronic Kidney Disease.    Anion gap 9 5 - 15  Protime-INR     Status: None   Collection Time: 10/05/14  9:53  PM  Result Value Ref Range   Prothrombin Time 14.0 11.6 - 15.2 seconds   INR 1.07 0.00 - 1.49  I-Stat Troponin, ED (not at [facility])     Status: None   Collection Time: 10/05/14  9:57 PM  Result Value Ref Range   Troponin i, poc 0.01 0.00 - 0.08 ng/mL   Comment 3            Comment: Due to the release kinetics of cTnI, a negative result within the first hours of the onset of symptoms does not rule out myocardial infarction with certainty. If myocardial infarction is still suspected, repeat the test at appropriate intervals.   CBG, ED     Status: Abnormal   Collection Time: 10/05/14 10:39 PM  Result Value Ref Range   Glucose-Capillary 172 (H) 70 - 99 mg/dL  Urinalysis, Routine w reflex microscopic     Status: Abnormal   Collection Time: 10/05/14 10:49 PM  Result Value Ref Range   Color, Urine YELLOW YELLOW   APPearance CLEAR CLEAR   Specific Gravity, Urine 1.014 1.005 - 1.030   pH 6.0 5.0 - 8.0   Glucose, UA 100 (A) NEGATIVE mg/dL   Hgb urine dipstick MODERATE (A) NEGATIVE   Bilirubin Urine NEGATIVE NEGATIVE   Ketones, ur NEGATIVE NEGATIVE mg/dL   Protein, ur >300 (A) NEGATIVE mg/dL   Urobilinogen, UA 0.2 0.0 - 1.0 mg/dL    Nitrite NEGATIVE NEGATIVE   Leukocytes, UA NEGATIVE NEGATIVE  Urine microscopic-add on     Status: None   Collection Time: 10/05/14 10:49 PM  Result Value Ref Range   Squamous Epithelial / LPF RARE RARE   WBC, UA 0-2 <3 WBC/hpf   RBC / HPF 3-6 <3 RBC/hpf   Bacteria, UA RARE RARE  I-stat troponin, ED (not at [facility])     Status: None   Collection Time: 10/05/14 11:02 PM  Result Value Ref Range   Troponin i, poc 0.03 0.00 - 0.08 ng/mL   Comment 3            Comment: Due to the release kinetics of cTnI, a negative result within the first hours of the onset of symptoms does not rule out myocardial infarction with certainty. If myocardial infarction is still suspected, repeat the test at appropriate intervals.   CBC     Status: Abnormal   Collection Time: 10/06/14  2:39 AM  Result Value Ref Range   WBC 6.7 4.0 - 10.5 K/uL   RBC 4.62 4.22 - 5.81 MIL/uL   Hemoglobin 12.4 (L) 13.0 - 17.0 g/dL   HCT 40.1 39.0 - 52.0 %   MCV 86.8 78.0 - 100.0 fL   MCH 26.8 26.0 - 34.0 pg   MCHC 30.9 30.0 - 36.0 g/dL   RDW 15.0 11.5 - 15.5 %   Platelets 138 (L) 150 - 400 K/uL  Creatinine, serum     Status: Abnormal   Collection Time: 10/06/14  2:39 AM  Result Value Ref Range   Creatinine, Ser 1.97 (H) 0.50 - 1.35 mg/dL   GFR calc non Af Amer 31 (L) >90 mL/min   GFR calc Af Amer 35 (L) >90 mL/min    Comment: (NOTE) The eGFR has been calculated using the CKD EPI equation. This calculation has not been validated in all clinical situations. eGFR's persistently <90 mL/min signify possible Chronic Kidney Disease.   Hemoglobin A1c     Status: Abnormal   Collection Time: 10/06/14  2:39 AM  Result Value Ref Range  Hgb A1c MFr Bld 6.0 (H) 4.8 - 5.6 %    Comment: (NOTE)         Pre-diabetes: 5.7 - 6.4         Diabetes: >6.4         Glycemic control for adults with diabetes: <7.0    Mean Plasma Glucose 126 mg/dL    Comment: (NOTE) Performed At: [facility] [place], [place]  [id number] Lindon Romp MD YF:[id number]   Troponin I-(serum)     Status: Abnormal   Collection Time: 10/06/14  2:39 AM  Result Value Ref Range   Troponin I 0.14 (H) <0.031 ng/mL    Comment:        PERSISTENTLY INCREASED TROPONIN VALUES IN THE RANGE OF 0.04-0.49 ng/mL CAN BE SEEN IN:       -UNSTABLE ANGINA       -CONGESTIVE HEART FAILURE       -MYOCARDITIS       -CHEST TRAUMA       -ARRYHTHMIAS       -LATE PRESENTING MYOCARDIAL INFARCTION       -COPD   CLINICAL FOLLOW-UP RECOMMENDED.   Magnesium     Status: None   Collection Time: 10/06/14  2:39 AM  Result Value Ref Range   Magnesium 2.0 1.5 - 2.5 mg/dL  Basic metabolic panel     Status: Abnormal   Collection Time: 10/06/14  8:15 AM  Result Value Ref Range   Sodium 137 135 - 145 mmol/L   Potassium 4.0 3.5 - 5.1 mmol/L   Chloride 104 96 - 112 mmol/L   CO2 26 19 - 32 mmol/L   Glucose, Bld 111 (H) 70 - 99 mg/dL   BUN 27 (H) 6 - 23 mg/dL   Creatinine, Ser 1.80 (H) 0.50 - 1.35 mg/dL   Calcium 8.5 8.4 - 10.5 mg/dL   GFR calc non Af Amer 34 (L) >90 mL/min   GFR calc Af Amer 40 (L) >90 mL/min    Comment: (NOTE) The eGFR has been calculated using the CKD EPI equation. This calculation has not been validated in all clinical situations. eGFR's persistently <90 mL/min signify possible Chronic Kidney Disease.    Anion gap 7 5 - 15  Troponin I-(serum)     Status: Abnormal   Collection Time: 10/06/14  8:15 AM  Result Value Ref Range   Troponin I 0.19 (H) <0.031 ng/mL    Comment:        PERSISTENTLY INCREASED TROPONIN VALUES IN THE RANGE OF 0.04-0.49 ng/mL CAN BE SEEN IN:       -UNSTABLE ANGINA       -CONGESTIVE HEART FAILURE       -MYOCARDITIS       -CHEST TRAUMA       -ARRYHTHMIAS       -LATE PRESENTING MYOCARDIAL INFARCTION       -COPD   CLINICAL FOLLOW-UP RECOMMENDED.   D-dimer, quantitative     Status: Abnormal   Collection Time: 10/06/14 10:40 AM  Result Value Ref Range   D-Dimer, Quant 1.45 (H) 0.00 - 0.48  ug/mL-FEU    Comment:        AT THE INHOUSE ESTABLISHED CUTOFF VALUE OF 0.48 ug/mL FEU, THIS ASSAY HAS BEEN DOCUMENTED IN THE LITERATURE TO HAVE A SENSITIVITY AND NEGATIVE PREDICTIVE VALUE OF AT LEAST 98 TO 99%.  THE TEST RESULT SHOULD BE CORRELATED WITH AN ASSESSMENT OF THE CLINICAL PROBABILITY OF DVT / VTE.   Glucose, capillary  Status: None   Collection Time: 10/06/14 11:27 AM  Result Value Ref Range   Glucose-Capillary 94 70 - 99 mg/dL  Troponin I-(serum)     Status: Abnormal   Collection Time: 10/06/14  2:30 PM  Result Value Ref Range   Troponin I 0.16 (H) <0.031 ng/mL    Comment:        PERSISTENTLY INCREASED TROPONIN VALUES IN THE RANGE OF 0.04-0.49 ng/mL CAN BE SEEN IN:       -UNSTABLE ANGINA       -CONGESTIVE HEART FAILURE       -MYOCARDITIS       -CHEST TRAUMA       -ARRYHTHMIAS       -LATE PRESENTING MYOCARDIAL INFARCTION       -COPD   CLINICAL FOLLOW-UP RECOMMENDED.   Glucose, capillary     Status: None   Collection Time: 10/06/14  9:42 PM  Result Value Ref Range   Glucose-Capillary 93 70 - 99 mg/dL   Comment 1 Notify RN   Basic metabolic panel     Status: Abnormal   Collection Time: 10/07/14  3:30 AM  Result Value Ref Range   Sodium 136 135 - 145 mmol/L   Potassium 4.6 3.5 - 5.1 mmol/L   Chloride 106 96 - 112 mmol/L   CO2 26 19 - 32 mmol/L   Glucose, Bld 108 (H) 70 - 99 mg/dL   BUN 25 (H) 6 - 23 mg/dL   Creatinine, Ser 1.79 (H) 0.50 - 1.35 mg/dL   Calcium 8.5 8.4 - 10.5 mg/dL   GFR calc non Af Amer 34 (L) >90 mL/min   GFR calc Af Amer 40 (L) >90 mL/min    Comment: (NOTE) The eGFR has been calculated using the CKD EPI equation. This calculation has not been validated in all clinical situations. eGFR's persistently <90 mL/min signify possible Chronic Kidney Disease.    Anion gap 4 (L) 5 - 15  Magnesium     Status: None   Collection Time: 10/07/14  3:30 AM  Result Value Ref Range   Magnesium 2.1 1.5 - 2.5 mg/dL  CBC     Status: Abnormal    Collection Time: 10/07/14  3:30 AM  Result Value Ref Range   WBC 8.4 4.0 - 10.5 K/uL   RBC 4.73 4.22 - 5.81 MIL/uL   Hemoglobin 12.7 (L) 13.0 - 17.0 g/dL   HCT 40.5 39.0 - 52.0 %   MCV 85.6 78.0 - 100.0 fL   MCH 26.8 26.0 - 34.0 pg   MCHC 31.4 30.0 - 36.0 g/dL   RDW 15.0 11.5 - 15.5 %   Platelets 144 (L) 150 - 400 K/uL    Dg Chest 2 View  10/06/2014   CLINICAL DATA:  Syncope tonight which shortness of breath  EXAM: CHEST  2 VIEW  COMPARISON:  07/02/2013  FINDINGS: Unchanged heart size and marked aortic tortuosity post CABG.  Low lung volumes interstitial crowding. There is no edema, consolidation, effusion, or pneumothorax.  There is chronic focal disc narrowing at T10-11 with subchondral erosive changes.  IMPRESSION: Stable low volume chest. No indication of acute cardiopulmonary disease.   Electronically Signed   By: Monte Fantasia M.D.   On: 10/06/2014 00:18   Nm Pulmonary Perf And Vent  10/06/2014   CLINICAL DATA:  Shortness of breath and syncope.  EXAM: NUCLEAR MEDICINE VENTILATION - PERFUSION LUNG SCAN  TECHNIQUE: Ventilation images were obtained in multiple projections using inhaled aerosol technetium 99 M DTPA. Perfusion images  were obtained in multiple projections after intravenous injection of Tc-77mMAA.  RADIOPHARMACEUTICALS:  6.0 mCi Tc-930mTPA aerosol and 40.0 mCi Tc-9983mA  COMPARISON:  Chest radiograph, 10/05/2014  FINDINGS: Ventilation: Inhalation somewhat heterogeneous, but no focal defect is seen.  Perfusion: No wedge shaped peripheral perfusion defects to suggest acute pulmonary embolism.  IMPRESSION: No evidence of a pulmonary embolism.   Electronically Signed   By: DavLajean ManesD.   On: 10/06/2014 17:08    Review of Systems  Constitutional: Positive for diaphoresis. Negative for fever.  HENT: Positive for congestion (Sinus).   Respiratory: Positive for shortness of breath.   Cardiovascular: Positive for leg swelling (Left left.  Old injury). Negative for chest pain,  palpitations and orthopnea.  Gastrointestinal: Negative for nausea, vomiting, abdominal pain, blood in stool and melena.  Genitourinary: Negative for hematuria.  Musculoskeletal: Negative for myalgias.  Neurological: Negative for dizziness.  All other systems reviewed and are negative.  Blood pressure 152/71, pulse 89, temperature 98.3 F (36.8 C), temperature source Oral, resp. rate 18, height '5\' 10"'  (1.778 m), weight 216 lb 4.3 oz (98.1 kg), SpO2 97 %. Physical Exam  Nursing note and vitals reviewed. Constitutional: He is oriented to person, place, and time. He appears well-developed and well-nourished.  HENT:  Head: Normocephalic and atraumatic.  Eyes: EOM are normal. Pupils are equal, round, and reactive to light.  Neck: Normal range of motion. Neck supple. No JVD present.  Cardiovascular: Normal rate, regular rhythm, S1 normal and S2 normal.   No murmur heard. Pulses:      Radial pulses are 2+ on the right side, and 2+ on the left side.       Dorsalis pedis pulses are 2+ on the right side, and 2+ on the left side.  Respiratory: Effort normal and breath sounds normal. He has no wheezes. He has no rales.  GI: Soft. He exhibits no distension. There is no tenderness.  Musculoskeletal: He exhibits edema (Trace LEE).  Neurological: He is alert and oriented to person, place, and time. He exhibits normal muscle tone.  Skin: Skin is warm and dry.  Psychiatric: He has a normal mood and affect.    Assessment/Plan:  Syncope  Ischemic Cardiomyopathy  LBBB  Sleep disordered breathing  CHF chronic systolic Class 2  Hypertension  High risk events by hx and in the context of both substrates, tachy with ICM and scar, and brady LBBB  Data suggest ICD is appropriate therapy;  EPS not necessary and with LBBB and the Hx I would proceed with CRT implantation  Have reviewed the potential benefits and risks of ICD implantation including but not limited to death, perforation of heart or  lung, lead dislodgement, infection,  device malfunction and inappropriate shocks.  The patient and family express understanding  and are willing to proceed.    He is scheduled for myoview in am, and anticipate this will be not high risk, and so will anticipate ICD implant on Monday  Needs home sleep study  Will anticipate increasing betablockers following device implant     HAGER, BRYAN, PAC 10/07/2014, 10:16 AM

## 2014-10-08 ENCOUNTER — Inpatient Hospital Stay (HOSPITAL_COMMUNITY): Payer: Medicare Other

## 2014-10-08 DIAGNOSIS — R079 Chest pain, unspecified: Secondary | ICD-10-CM

## 2014-10-08 LAB — GLUCOSE, CAPILLARY
GLUCOSE-CAPILLARY: 114 mg/dL — AB (ref 70–99)
Glucose-Capillary: 123 mg/dL — ABNORMAL HIGH (ref 70–99)
Glucose-Capillary: 122 mg/dL — ABNORMAL HIGH (ref 70–99)
Glucose-Capillary: 127 mg/dL — ABNORMAL HIGH (ref 70–99)

## 2014-10-08 MED ORDER — TECHNETIUM TC 99M SESTAMIBI - CARDIOLITE
30.0000 | Freq: Once | INTRAVENOUS | Status: AC | PRN
  Administered 2014-10-08: 31 via INTRAVENOUS

## 2014-10-08 MED ORDER — TECHNETIUM TC 99M SESTAMIBI GENERIC - CARDIOLITE
10.0000 | Freq: Once | INTRAVENOUS | Status: AC | PRN
  Administered 2014-10-08: 10 via INTRAVENOUS

## 2014-10-08 MED ORDER — REGADENOSON 0.4 MG/5ML IV SOLN
0.4000 mg | Freq: Once | INTRAVENOUS | Status: AC
  Administered 2014-10-08: 0.4 mg via INTRAVENOUS
  Filled 2014-10-08: qty 5

## 2014-10-08 MED ORDER — REGADENOSON 0.4 MG/5ML IV SOLN
INTRAVENOUS | Status: AC
  Filled 2014-10-08: qty 5

## 2014-10-08 NOTE — Progress Notes (Signed)
    Subjective:  Denies CP or dyspnea; congested   Objective:  Filed Vitals:   10/07/14 0943 10/07/14 1445 10/07/14 2037 10/08/14 0549  BP: 152/71 140/80 157/95 155/82  Pulse:  86 89 85  Temp:  98.2 F (36.8 C) 97.9 F (36.6 C) 98.1 F (36.7 C)  TempSrc:  Oral Oral Oral  Resp:  18 18 18   Height:      Weight:      SpO2:  95% 96% 98%    Intake/Output from previous day:  Intake/Output Summary (Last 24 hours) at 10/08/14 0705 Last data filed at 10/07/14 0900  Gross per 24 hour  Intake    240 ml  Output      0 ml  Net    240 ml    Physical Exam: Physical exam: Well-developed well-nourished in no acute distress.  Skin is warm and dry.  HEENT is normal.  Neck is supple.  Chest is clear to auscultation with normal expansion.  Cardiovascular exam is regular rate and rhythm.  Abdominal exam nontender or distended. No masses palpated. Extremities show no edema. neuro grossly intact    Lab Results: Basic Metabolic Panel:  Recent Labs  10/06/14 0239 10/06/14 0815 10/07/14 0330  NA  --  137 136  K  --  4.0 4.6  CL  --  104 106  CO2  --  26 26  GLUCOSE  --  111* 108*  BUN  --  27* 25*  CREATININE 1.97* 1.80* 1.79*  CALCIUM  --  8.5 8.5  MG 2.0  --  2.1   CBC:  Recent Labs  10/06/14 0239 10/07/14 0330  WBC 6.7 8.4  HGB 12.4* 12.7*  HCT 40.1 40.5  MCV 86.8 85.6  PLT 138* 144*   Cardiac Enzymes:  Recent Labs  10/06/14 0239 10/06/14 0815 10/06/14 1430  TROPONINI 0.14* 0.19* 0.16*     Assessment/Plan:  1 syncope-etiology unclear. Telemetry reviewed and there is occasional nonsustained ventricular tachycardia. Ejection fraction 35-40%. Electrocardiogram shows baseline left bundle branch block. Concerned about the possibility of bradycardia mediated events or ventricular arrhythmia. Dr Caryl Comes has reviewed and plans ICD Monday. Patient instructed not to drive for 6 months. 2 dyspnea with minimally elevated troponin-VQ scan negative. Minimal elevation  in troponin with no clear trend up. Possibly secondary to renal insufficiency. For nuclear study today. 3 ischemic cardiomyopathy-continue ARB. Blood pressure is elevated. Increase beta blocker further following device implant.  4 hyperlipidemia-continue statin. 5 coronary artery disease 6 chronic stage III renal failure-Follow renal function.  Kirk Ruths 10/08/2014, 7:05 AM

## 2014-10-08 NOTE — Progress Notes (Signed)
lexiscan myoview completed without complications nuc results to follow.  + PVCs and Couplets.

## 2014-10-09 LAB — GLUCOSE, CAPILLARY
GLUCOSE-CAPILLARY: 163 mg/dL — AB (ref 70–99)
Glucose-Capillary: 128 mg/dL — ABNORMAL HIGH (ref 70–99)
Glucose-Capillary: 94 mg/dL (ref 70–99)
Glucose-Capillary: 112 mg/dL — ABNORMAL HIGH (ref 70–99)

## 2014-10-09 NOTE — Progress Notes (Signed)
    Subjective:  Denies CP or dyspnea   Objective:  Filed Vitals:   10/08/14 0927 10/08/14 1509 10/08/14 2045 10/09/14 0536  BP: 156/80 154/76 168/94 154/95  Pulse: 95 86 86 79  Temp:  98 F (36.7 C) 97.8 F (36.6 C) 98.1 F (36.7 C)  TempSrc:  Oral Oral Oral  Resp:  18 18 18   Height:      Weight:      SpO2:  96% 93% 97%    Intake/Output from previous day:  Intake/Output Summary (Last 24 hours) at 10/09/14 0757 Last data filed at 10/08/14 1700  Gross per 24 hour  Intake   1080 ml  Output      0 ml  Net   1080 ml    Physical Exam: Physical exam: Well-developed well-nourished in no acute distress.  Skin is warm and dry.  HEENT is normal.  Neck is supple.  Chest is clear to auscultation with normal expansion.  Cardiovascular exam is regular rate and rhythm.  Abdominal exam nontender or distended. No masses palpated. Extremities show no edema. neuro grossly intact    Lab Results: Basic Metabolic Panel:  Recent Labs  10/06/14 0815 10/07/14 0330  NA 137 136  K 4.0 4.6  CL 104 106  CO2 26 26  GLUCOSE 111* 108*  BUN 27* 25*  CREATININE 1.80* 1.79*  CALCIUM 8.5 8.5  MG  --  2.1   CBC:  Recent Labs  10/07/14 0330  WBC 8.4  HGB 12.7*  HCT 40.5  MCV 85.6  PLT 144*   Cardiac Enzymes:  Recent Labs  10/06/14 0815 10/06/14 1430  TROPONINI 0.19* 0.16*     Assessment/Plan:  1 syncope-etiology unclear. Ejection fraction 35-40%. Electrocardiogram shows baseline left bundle branch block. Concerned about the possibility of bradycardia mediated events or ventricular arrhythmia. Dr Caryl Comes has reviewed and plans ICD Monday. Patient instructed not to drive for 6 months. 2 dyspnea with minimally elevated troponin-VQ scan negative. Minimal elevation in troponin with no clear trend up. Possibly secondary to renal insufficiency. Nuclear study shows infarct and no ischemia. No further WU.  3 ischemic cardiomyopathy-continue ARB. Blood pressure is elevated.  Increase beta blocker further following device implant.  4 hyperlipidemia-continue statin. 5 coronary artery disease 6 chronic stage III renal failure-Follow renal function.  Shane [name] 10/09/2014, 7:57 AM

## 2014-10-10 ENCOUNTER — Other Ambulatory Visit: Payer: Self-pay | Admitting: Cardiology

## 2014-10-10 ENCOUNTER — Encounter (HOSPITAL_COMMUNITY)
Admission: EM | Disposition: A | Discharge status: Home / Self Care | Payer: Self-pay | Source: Home / Self Care | Attending: Cardiology

## 2014-10-10 ENCOUNTER — Encounter (HOSPITAL_COMMUNITY): Payer: Self-pay | Admitting: Internal Medicine

## 2014-10-10 ENCOUNTER — Ambulatory Visit: Payer: Private Health Insurance - Indemnity

## 2014-10-10 DIAGNOSIS — I509 Heart failure, unspecified: Secondary | ICD-10-CM

## 2014-10-10 DIAGNOSIS — I472 Ventricular tachycardia: Secondary | ICD-10-CM

## 2014-10-10 DIAGNOSIS — I447 Left bundle-branch block, unspecified: Secondary | ICD-10-CM

## 2014-10-10 HISTORY — PX: BI-VENTRICULAR IMPLANTABLE CARDIOVERTER DEFIBRILLATOR: SHX5459

## 2014-10-10 LAB — GLUCOSE, CAPILLARY
GLUCOSE-CAPILLARY: 115 mg/dL — AB (ref 70–99)
Glucose-Capillary: 117 mg/dL — ABNORMAL HIGH (ref 70–99)
Glucose-Capillary: 208 mg/dL — ABNORMAL HIGH (ref 70–99)
Glucose-Capillary: 122 mg/dL — ABNORMAL HIGH (ref 70–99)

## 2014-10-10 LAB — BASIC METABOLIC PANEL
ANION GAP: 6 (ref 5–15)
BUN: 32 mg/dL — ABNORMAL HIGH (ref 6–23)
CALCIUM: 8.6 mg/dL (ref 8.4–10.5)
CO2: 26 mmol/L (ref 19–32)
CREATININE: 1.77 mg/dL — AB (ref 0.50–1.35)
Chloride: 105 mmol/L (ref 96–112)
GFR calc Af Amer: 40 mL/min — ABNORMAL LOW (ref >90)
GFR calc non Af Amer: 35 mL/min — ABNORMAL LOW (ref >90)
Glucose, Bld: 119 mg/dL — ABNORMAL HIGH (ref 70–99)
Potassium: 3.8 mmol/L (ref 3.5–5.1)
Sodium: 137 mmol/L (ref 135–145)

## 2014-10-10 SURGERY — BI-VENTRICULAR IMPLANTABLE CARDIOVERTER DEFIBRILLATOR  (CRT-D)
Anesthesia: LOCAL

## 2014-10-10 MED ORDER — LABETALOL HCL 5 MG/ML IV SOLN
INTRAVENOUS | Status: AC
  Filled 2014-10-10: qty 4

## 2014-10-10 MED ORDER — SODIUM CHLORIDE 0.9 % IV SOLN
INTRAVENOUS | Status: DC
  Administered 2014-10-10: 11:00:00 via INTRAVENOUS

## 2014-10-10 MED ORDER — CEFAZOLIN SODIUM-DEXTROSE 2-3 GM-% IV SOLR
INTRAVENOUS | Status: AC
  Filled 2014-10-10: qty 50

## 2014-10-10 MED ORDER — SODIUM CHLORIDE 0.9 % IV SOLN: INTRAVENOUS | Status: AC

## 2014-10-10 MED ORDER — ACETAMINOPHEN 325 MG PO TABS
325.0000 mg | ORAL_TABLET | ORAL | Status: DC | PRN
  Administered 2014-10-10 – 2014-10-11 (×3): 650 mg via ORAL
  Filled 2014-10-10 (×2): qty 2
  Filled 2014-10-10: qty 1
  Filled 2014-10-10: qty 2

## 2014-10-10 MED ORDER — GENTAMICIN SULFATE 40 MG/ML IJ SOLN
INTRAMUSCULAR | Status: AC
  Administered 2014-10-10: 11:00:00
  Filled 2014-10-10 (×2): qty 2

## 2014-10-10 MED ORDER — LIDOCAINE HCL (PF) 1 % IJ SOLN
INTRAMUSCULAR | Status: AC
  Filled 2014-10-10: qty 30

## 2014-10-10 MED ORDER — LIDOCAINE HCL (PF) 1 % IJ SOLN
INTRAMUSCULAR | Status: AC
  Filled 2014-10-10: qty 60

## 2014-10-10 MED ORDER — MIDAZOLAM HCL 5 MG/5ML IJ SOLN
INTRAMUSCULAR | Status: AC
  Filled 2014-10-10: qty 5

## 2014-10-10 MED ORDER — SODIUM CHLORIDE 0.9 % IV SOLN
INTRAVENOUS | Status: DC
Start: 2014-10-10 — End: 2014-10-11
  Administered 2014-10-10: 11:00:00 via INTRAVENOUS

## 2014-10-10 MED ORDER — CEFAZOLIN SODIUM 1-5 GM-% IV SOLN
1.0000 g | Freq: Four times a day (QID) | INTRAVENOUS | Status: AC
  Administered 2014-10-10 – 2014-10-11 (×3): 1 g via INTRAVENOUS
  Filled 2014-10-10 (×4): qty 50

## 2014-10-10 MED ORDER — FENTANYL CITRATE 0.05 MG/ML IJ SOLN
INTRAMUSCULAR | Status: AC
  Filled 2014-10-10: qty 2

## 2014-10-10 MED ORDER — HEPARIN (PORCINE) IN NACL 2-0.9 UNIT/ML-% IJ SOLN
INTRAMUSCULAR | Status: AC
  Filled 2014-10-10: qty 500

## 2014-10-10 MED ORDER — CARVEDILOL 25 MG PO TABS
25.0000 mg | ORAL_TABLET | Freq: Two times a day (BID) | ORAL | Status: DC
  Administered 2014-10-10 – 2014-10-11 (×2): 25 mg via ORAL
  Filled 2014-10-10 (×4): qty 1

## 2014-10-10 MED ORDER — ONDANSETRON HCL 4 MG/2ML IJ SOLN: 4.0000 mg | Freq: Four times a day (QID) | INTRAMUSCULAR | Status: DC | PRN

## 2014-10-10 NOTE — Progress Notes (Signed)
metoprolol succinate changed to carvedilol to improve BP control

## 2014-10-10 NOTE — Progress Notes (Signed)
.       Patient Name: Shane [name]      SUBJECTIVE:*no cjest pain or shortness of breath  Past Medical History  Diagnosis Date  . Coronary artery disease     remote CABG in 1985, cath in 2003 by Dr. Lia Foyer with no PCI, last nuclear in 2010 showing scar and EF of 28%.   . Dyslipidemia   . Hypercholesterolemia   . Left ventricular dysfunction     28% per nuclear in 2010 and 35 to 40% per echo in 2010  . HTN (hypertension)   . Diabetes mellitus   . Cellulitis of arm, left     MSSA  . Spinal stenosis   . BPH (benign prostatic hyperplasia)   . Allergic rhinitis 01-08-13    Uses nebulizer for chronic sinus issues and Mucinex.  . Abnormal nuclear cardiac imaging test Nov 2010    moderate area of infarct in the inferior wall with only minimal reversibility and EF of 28%  . Myocardial infarction     1985  . Pneumonia     hx of several times years ago   . Blood transfusion     ? at time of bypass surgery   . Arthritis     osteoarthritis   . Colon polyps     adenomatous  . Neuromuscular disorder     legs mild paralysis-able to walk"nerve damage"-legs- left leg brace   . CKD (chronic kidney disease), stage III     "was told due to meds he takes"-no Renal workup done  . Glaucoma 01-08-13    tx. eye drops    Scheduled Meds:  Scheduled Meds: . amoxicillin  500 mg Oral TID  . aspirin EC  81 mg Oral Daily  . atorvastatin  10 mg Oral Daily  . azelastine  2 spray Each Nare BID  .  ceFAZolin (ANCEF) IV  2 g Intravenous On Call  . gentamicin irrigation  80 mg Irrigation On Call  . gentamicin irrigation   Irrigation To Cath  . heparin  5,000 Units Subcutaneous 3 times per day  . irbesartan  300 mg Oral Daily   And  . hydrochlorothiazide  12.5 mg Oral Daily  . latanoprost  1 drop Both Eyes QHS  . metoprolol succinate  50 mg Oral Daily  . spironolactone  25 mg Oral QODAY   Continuous Infusions: . sodium chloride 50 mL/hr at 10/10/14 1048  . sodium chloride 50 mL/hr at  10/10/14 1048   acetaminophen, nitroGLYCERIN, ondansetron (ZOFRAN) IV, zolpidem    PHYSICAL EXAM Filed Vitals:   10/09/14 0536 10/09/14 1613 10/09/14 2017 10/10/14 0615  BP: 154/95 151/87 146/85 146/90  Pulse: 79 80 82 78  Temp: 98.1 F (36.7 C) 97.7 F (36.5 C) 98.7 F (37.1 C) 98.5 F (36.9 C)  TempSrc: Oral Oral Oral Oral  Resp: 18 18 18 18   Height:      Weight:    206 lb 5.6 oz (93.6 kg)  SpO2: 97% 100% 98% 97%   Well developed and nourished in no acute distress HENT normal Neck supple with JVP-flat Clear Regular rate and rhythm, no murmurs or gallops Abd-soft with active BS No Clubbing cyanosis edema Skin-warm and dry A & Oriented  Grossly normal sensory and motor function   TELEMETRY: Reviewed telemetry pt in nsr     Intake/Output Summary (Last 24 hours) at 10/10/14 1104 Last data filed at 10/10/14 0703  Gross per 24 hour  Intake    480  ml  Output      0 ml  Net    480 ml    LABS: Basic Metabolic Panel:  Recent Labs Lab 10/05/14 2153 10/06/14 0239 10/06/14 0815 10/07/14 0330 10/10/14 0522  NA 133*  --  137 136 137  K 3.7  --  4.0 4.6 3.8  CL 102  --  104 106 105  CO2 22  --  26 26 26   GLUCOSE 171*  --  111* 108* 119*  BUN 30*  --  27* 25* 32*  CREATININE 1.89* 1.97* 1.80* 1.79* 1.77*  CALCIUM 8.5  --  8.5 8.5 8.6  MG  --  2.0  --  2.1  --    Cardiac Enzymes: No results for input(s): CKTOTAL, CKMB, CKMBINDEX, TROPONINI in the last 72 hours. CBC:  Recent Labs Lab 10/05/14 2153 10/06/14 0239 10/07/14 0330  WBC 6.3 6.7 8.4  HGB 12.9* 12.4* 12.7*  HCT 41.1 40.1 40.5  MCV 86.2 86.8 85.6  PLT 131* 138* 144*    ProBNP (last 3 results) No results for input(s): PROBNP in the last 8760 hours.      ASSESSMENT AND PLAN:  Active Problems:   Syncope  For ICD-CRT  today Have reviewed the potential benefits and risks of ICD implantation including but not limited to death, perforation of heart or lung, lead dislodgement, infection,   device malfunction and inappropriate shocks.  The patient  express understanding  and are willing to proceed.     Signed, Virl Axe MD  10/10/2014

## 2014-10-10 NOTE — H&P (View-Only) (Signed)
Cardiologist:   Reason for Consult: Syncope Referring Physician:  Esequiel Kleinfelter is an 81 y.o. male.  HPI:   The patient is a 81 yo male with a history of CAD, remote CABG in 1985, cath in 2003 by Dr. Lia Foyer with no PCI, last nuclear in 2010 showing scar and EF of 28%.  Echo yesterday reveals EF 35-40%, Akinesis and scarring of the basal-midinferolateral, inferior, and inferoseptal myocardium.  PA pressure 42mHg. LA moderately dilated.  Ef appears a little down, but akinesis region the same.   His history also includes dyslipidemia, HTN, DM, CKD III.  He presented on 10/05/14 with syncope.  He had a sinus infection which was treated with amoxicillin and antihistamine, sudafed.  Last dose of sudafed was around 1300hrs. He laid down at 2000hrs and felt SOB.  Everything then went black.  He was out for about a minute.  He was a little sweaty after but otherwise felt his normal self.  The patient denied having nausea, vomiting, fever, chest pain, orthopnea, dizziness, PND, cough, congestion, abdominal pain, hematochezia, melena, lower extremity edema, claudication.  EP asked to see for possible loop recorder implant.   Pt has prior hx of syncope about 2 years ago, this with some prodrome, also brief and like this episode, wtihout residual orthostatic intolerance  NO hx of palpiations  Functional status, class 2  DOE < 1/4 mile, thinks he can walk stairs  Sleep disordered breathing with daytime somnolence  Troponin 0.19, Mag 2.0, K 3.7 VQ negative for PE.      Past Medical History  Diagnosis Date  . Coronary artery disease     remote CABG in 1985, cath in 2003 by Dr. SLia Foyerwith no PCI, last nuclear in 2010 showing scar and EF of 28%.   . Dyslipidemia   . Hypercholesterolemia   . Left ventricular dysfunction     28% per nuclear in 2010 and 35 to 40% per echo in 2010  . HTN (hypertension)   . Diabetes mellitus   . Cellulitis of arm, left     MSSA  . Spinal stenosis   .  BPH (benign prostatic hyperplasia)   . Allergic rhinitis 01-08-13    Uses nebulizer for chronic sinus issues and Mucinex.  . Abnormal nuclear cardiac imaging test Nov 2010    moderate area of infarct in the inferior wall with only minimal reversibility and EF of 28%  . Myocardial infarction     1985  . Pneumonia     hx of several times years ago   . Blood transfusion     ? at time of bypass surgery   . Arthritis     osteoarthritis   . Colon polyps     adenomatous  . Neuromuscular disorder     legs mild paralysis-able to walk"nerve damage"-legs- left leg brace   . CKD (chronic kidney disease), stage III     "was told due to meds he takes"-no Renal workup done  . Glaucoma 01-08-13    tx. eye drops    Past Surgical History  Procedure Laterality Date  . Cardiac catheterization  2003  . Lumbar disc surgery    . Prostate surgery    . Back surgery      hx of back surgery x 4   . Total knee arthroplasty  08/01/2011    Procedure: TOTAL KNEE ARTHROPLASTY;  Surgeon: JJohnn Hai  Location: [facility];  Service: Orthopedics;  Laterality: Right;  .  Cataract       recent cataract surgery 6'14  . Coronary artery bypass graft  1985    x 5 vessels  . Colonoscopy N/A 01/25/2013    Procedure: COLONOSCOPY;  Surgeon: Irene Shipper, MD;  Location: [facility];  Service: Endoscopy;  Laterality: N/A;    Family History  Problem Relation Age of Onset  . Heart attack Father   . Heart disease Father   . Colon polyps Father   . Diabetes Sister     x 2  . Heart disease Brother     x 2  . Lung cancer Mother   . Bone cancer Brother     Social History:  reports that he quit smoking about 56 years ago. His smoking use included Cigarettes, Pipe, and Cigars. He has a 5 pack-year smoking history. He has never used smokeless tobacco. He reports that he does not drink alcohol or use illicit drugs.  Allergies:  Allergies  Allergen Reactions  . Codeine     anxiety  . Hydrocodone     anxiety  .  Azithromycin Rash    Medications: Scheduled Meds: . amoxicillin  500 mg Oral TID  . aspirin EC  81 mg Oral Daily  . atorvastatin  10 mg Oral Daily  . azelastine  2 spray Each Nare BID  . heparin  5,000 Units Subcutaneous 3 times per day  . irbesartan  300 mg Oral Daily   And  . hydrochlorothiazide  12.5 mg Oral Daily  . metoprolol succinate  50 mg Oral Daily  . spironolactone  25 mg Oral QODAY      Medication List    ASK your doctor about these medications        acetaminophen 500 MG tablet  Commonly known as:  TYLENOL  Take 1,000 mg by mouth as needed for pain.     amoxicillin 500 MG capsule  Commonly known as:  AMOXIL  Take 1 capsule (500 mg total) by mouth 3 (three) times daily.     aspirin EC 81 MG tablet  Take 81 mg by mouth every morning.     atorvastatin 10 MG tablet  Commonly known as:  LIPITOR  TAKE 1 TABLET DAILY     b complex vitamins tablet  Take 1 tablet by mouth daily.     CIALIS 20 MG tablet  Generic drug:  tadalafil  Take 20 mg by mouth daily as needed for erectile dysfunction.     dextromethorphan-guaiFENesin 30-600 MG per 12 hr tablet  Commonly known as:  MUCINEX DM  Take 1 tablet by mouth 2 (two) times daily as needed for cough.     diphenhydrAMINE 25 mg capsule  Commonly known as:  BENADRYL  Take 25 mg by mouth every 6 (six) hours as needed for allergies.     EPINEPHrine 0.3 mg/0.3 mL Soaj injection  Commonly known as:  EPIPEN  Inject 0.3 mLs (0.3 mg total) into the muscle once.     fluocinonide cream 0.05 %  Commonly known as:  LIDEX  Apply topically 2 (two) times daily as needed.     latanoprost 0.005 % ophthalmic solution  Commonly known as:  XALATAN  Place 1 drop into both eyes at bedtime.     metFORMIN 500 MG tablet  Commonly known as:  GLUCOPHAGE  TAKE ONE TABLET BY MOUTH ONCE DAILY     metoprolol succinate 50 MG 24 hr tablet  Commonly known as:  TOPROL-XL  TAKE ONE-HALF (1/2) TABLET DAILY  OR AS DIRECTED      multivitamin tablet  Take 1 tablet by mouth daily.     naproxen sodium 220 MG tablet  Commonly known as:  ANAPROX  Take 220 mg by mouth daily as needed (back pain).     NASONEX 50 MCG/ACT nasal spray  Generic drug:  mometasone  USE 2 SPRAYS IN THE NOSE DAILY     NIASPAN 1000 MG CR tablet  Generic drug:  niacin  Take 1 tablet (1,000 mg total) by mouth 2 (two) times daily.     NORVASC 10 MG tablet  Generic drug:  amLODipine  TAKE 1 TABLET DAILY     omalizumab 150 MG injection  Commonly known as:  XOLAIR  Inject 150 mg into the skin every 14 (fourteen) days.     pseudoephedrine 30 MG tablet  Commonly known as:  SUDAFED  Take 30 mg by mouth every 4 (four) hours as needed for congestion.     spironolactone 25 MG tablet  Commonly known as:  ALDACTONE  Take 1 tablet (25 mg total) by mouth every other day.     testosterone cypionate 100 MG/ML injection  Commonly known as:  DEPOTESTOTERONE CYPIONATE  Inject 100 mg into the muscle every 14 (fourteen) days. For IM use only     valsartan-hydrochlorothiazide 320-12.5 MG per tablet  Commonly known as:  DIOVAN-HCT  TAKE 1 TABLET DAILY     Vitamin D-3 1000 UNITS Caps  Take 2,000 Units by mouth daily.     zolpidem 10 MG tablet  Commonly known as:  AMBIEN  TAKE ONE-HALF TO ONE TABLET BY MOUTH AT BEDTIME AS NEEDED FOR SLEEP        Continuous Infusions:  PRN Meds:.acetaminophen, nitroGLYCERIN, ondansetron (ZOFRAN) IV, zolpidem   Results for orders placed or performed during the hospital encounter of 10/05/14 (from the past 48 hour(s))  CBC  (at [facility])     Status: Abnormal   Collection Time: 10/05/14  9:53 PM  Result Value Ref Range   WBC 6.3 4.0 - 10.5 K/uL   RBC 4.77 4.22 - 5.81 MIL/uL   Hemoglobin 12.9 (L) 13.0 - 17.0 g/dL   HCT 41.1 39.0 - 52.0 %   MCV 86.2 78.0 - 100.0 fL   MCH 27.0 26.0 - 34.0 pg   MCHC 31.4 30.0 - 36.0 g/dL   RDW 14.7 11.5 - 15.5 %   Platelets 131 (L) 150 - 400 K/uL  Basic metabolic  panel  (at [facility])     Status: Abnormal   Collection Time: 10/05/14  9:53 PM  Result Value Ref Range   Sodium 133 (L) 135 - 145 mmol/L   Potassium 3.7 3.5 - 5.1 mmol/L   Chloride 102 96 - 112 mmol/L   CO2 22 19 - 32 mmol/L   Glucose, Bld 171 (H) 70 - 99 mg/dL   BUN 30 (H) 6 - 23 mg/dL   Creatinine, Ser 1.89 (H) 0.50 - 1.35 mg/dL   Calcium 8.5 8.4 - 10.5 mg/dL   GFR calc non Af Amer 32 (L) >90 mL/min   GFR calc Af Amer 37 (L) >90 mL/min    Comment: (NOTE) The eGFR has been calculated using the CKD EPI equation. This calculation has not been validated in all clinical situations. eGFR's persistently <90 mL/min signify possible Chronic Kidney Disease.    Anion gap 9 5 - 15  Protime-INR     Status: None   Collection Time: 10/05/14  9:53  PM  Result Value Ref Range   Prothrombin Time 14.0 11.6 - 15.2 seconds   INR 1.07 0.00 - 1.49  I-Stat Troponin, ED (not at [facility])     Status: None   Collection Time: 10/05/14  9:57 PM  Result Value Ref Range   Troponin i, poc 0.01 0.00 - 0.08 ng/mL   Comment 3            Comment: Due to the release kinetics of cTnI, a negative result within the first hours of the onset of symptoms does not rule out myocardial infarction with certainty. If myocardial infarction is still suspected, repeat the test at appropriate intervals.   CBG, ED     Status: Abnormal   Collection Time: 10/05/14 10:39 PM  Result Value Ref Range   Glucose-Capillary 172 (H) 70 - 99 mg/dL  Urinalysis, Routine w reflex microscopic     Status: Abnormal   Collection Time: 10/05/14 10:49 PM  Result Value Ref Range   Color, Urine YELLOW YELLOW   APPearance CLEAR CLEAR   Specific Gravity, Urine 1.014 1.005 - 1.030   pH 6.0 5.0 - 8.0   Glucose, UA 100 (A) NEGATIVE mg/dL   Hgb urine dipstick MODERATE (A) NEGATIVE   Bilirubin Urine NEGATIVE NEGATIVE   Ketones, ur NEGATIVE NEGATIVE mg/dL   Protein, ur >300 (A) NEGATIVE mg/dL   Urobilinogen, UA 0.2 0.0 - 1.0 mg/dL    Nitrite NEGATIVE NEGATIVE   Leukocytes, UA NEGATIVE NEGATIVE  Urine microscopic-add on     Status: None   Collection Time: 10/05/14 10:49 PM  Result Value Ref Range   Squamous Epithelial / LPF RARE RARE   WBC, UA 0-2 <3 WBC/hpf   RBC / HPF 3-6 <3 RBC/hpf   Bacteria, UA RARE RARE  I-stat troponin, ED (not at [facility])     Status: None   Collection Time: 10/05/14 11:02 PM  Result Value Ref Range   Troponin i, poc 0.03 0.00 - 0.08 ng/mL   Comment 3            Comment: Due to the release kinetics of cTnI, a negative result within the first hours of the onset of symptoms does not rule out myocardial infarction with certainty. If myocardial infarction is still suspected, repeat the test at appropriate intervals.   CBC     Status: Abnormal   Collection Time: 10/06/14  2:39 AM  Result Value Ref Range   WBC 6.7 4.0 - 10.5 K/uL   RBC 4.62 4.22 - 5.81 MIL/uL   Hemoglobin 12.4 (L) 13.0 - 17.0 g/dL   HCT 40.1 39.0 - 52.0 %   MCV 86.8 78.0 - 100.0 fL   MCH 26.8 26.0 - 34.0 pg   MCHC 30.9 30.0 - 36.0 g/dL   RDW 15.0 11.5 - 15.5 %   Platelets 138 (L) 150 - 400 K/uL  Creatinine, serum     Status: Abnormal   Collection Time: 10/06/14  2:39 AM  Result Value Ref Range   Creatinine, Ser 1.97 (H) 0.50 - 1.35 mg/dL   GFR calc non Af Amer 31 (L) >90 mL/min   GFR calc Af Amer 35 (L) >90 mL/min    Comment: (NOTE) The eGFR has been calculated using the CKD EPI equation. This calculation has not been validated in all clinical situations. eGFR's persistently <90 mL/min signify possible Chronic Kidney Disease.   Hemoglobin A1c     Status: Abnormal   Collection Time: 10/06/14  2:39 AM  Result Value Ref Range  Hgb A1c MFr Bld 6.0 (H) 4.8 - 5.6 %    Comment: (NOTE)         Pre-diabetes: 5.7 - 6.4         Diabetes: >6.4         Glycemic control for adults with diabetes: <7.0    Mean Plasma Glucose 126 mg/dL    Comment: (NOTE) Performed At: [facility] [place], [place]  [id number] Lindon Romp MD [id number]   Troponin I-(serum)     Status: Abnormal   Collection Time: 10/06/14  2:39 AM  Result Value Ref Range   Troponin I 0.14 (H) <0.031 ng/mL    Comment:        PERSISTENTLY INCREASED TROPONIN VALUES IN THE RANGE OF 0.04-0.49 ng/mL CAN BE SEEN IN:       -UNSTABLE ANGINA       -CONGESTIVE HEART FAILURE       -MYOCARDITIS       -CHEST TRAUMA       -ARRYHTHMIAS       -LATE PRESENTING MYOCARDIAL INFARCTION       -COPD   CLINICAL FOLLOW-UP RECOMMENDED.   Magnesium     Status: None   Collection Time: 10/06/14  2:39 AM  Result Value Ref Range   Magnesium 2.0 1.5 - 2.5 mg/dL  Basic metabolic panel     Status: Abnormal   Collection Time: 10/06/14  8:15 AM  Result Value Ref Range   Sodium 137 135 - 145 mmol/L   Potassium 4.0 3.5 - 5.1 mmol/L   Chloride 104 96 - 112 mmol/L   CO2 26 19 - 32 mmol/L   Glucose, Bld 111 (H) 70 - 99 mg/dL   BUN 27 (H) 6 - 23 mg/dL   Creatinine, Ser 1.80 (H) 0.50 - 1.35 mg/dL   Calcium 8.5 8.4 - 10.5 mg/dL   GFR calc non Af Amer 34 (L) >90 mL/min   GFR calc Af Amer 40 (L) >90 mL/min    Comment: (NOTE) The eGFR has been calculated using the CKD EPI equation. This calculation has not been validated in all clinical situations. eGFR's persistently <90 mL/min signify possible Chronic Kidney Disease.    Anion gap 7 5 - 15  Troponin I-(serum)     Status: Abnormal   Collection Time: 10/06/14  8:15 AM  Result Value Ref Range   Troponin I 0.19 (H) <0.031 ng/mL    Comment:        PERSISTENTLY INCREASED TROPONIN VALUES IN THE RANGE OF 0.04-0.49 ng/mL CAN BE SEEN IN:       -UNSTABLE ANGINA       -CONGESTIVE HEART FAILURE       -MYOCARDITIS       -CHEST TRAUMA       -ARRYHTHMIAS       -LATE PRESENTING MYOCARDIAL INFARCTION       -COPD   CLINICAL FOLLOW-UP RECOMMENDED.   D-dimer, quantitative     Status: Abnormal   Collection Time: 10/06/14 10:40 AM  Result Value Ref Range   D-Dimer, Quant 1.45 (H) 0.00 - 0.48  ug/mL-FEU    Comment:        AT THE INHOUSE ESTABLISHED CUTOFF VALUE OF 0.48 ug/mL FEU, THIS ASSAY HAS BEEN DOCUMENTED IN THE LITERATURE TO HAVE A SENSITIVITY AND NEGATIVE PREDICTIVE VALUE OF AT LEAST 98 TO 99%.  THE TEST RESULT SHOULD BE CORRELATED WITH AN ASSESSMENT OF THE CLINICAL PROBABILITY OF DVT / VTE.   Glucose, capillary  Status: None   Collection Time: 10/06/14 11:27 AM  Result Value Ref Range   Glucose-Capillary 94 70 - 99 mg/dL  Troponin I-(serum)     Status: Abnormal   Collection Time: 10/06/14  2:30 PM  Result Value Ref Range   Troponin I 0.16 (H) <0.031 ng/mL    Comment:        PERSISTENTLY INCREASED TROPONIN VALUES IN THE RANGE OF 0.04-0.49 ng/mL CAN BE SEEN IN:       -UNSTABLE ANGINA       -CONGESTIVE HEART FAILURE       -MYOCARDITIS       -CHEST TRAUMA       -ARRYHTHMIAS       -LATE PRESENTING MYOCARDIAL INFARCTION       -COPD   CLINICAL FOLLOW-UP RECOMMENDED.   Glucose, capillary     Status: None   Collection Time: 10/06/14  9:42 PM  Result Value Ref Range   Glucose-Capillary 93 70 - 99 mg/dL   Comment 1 Notify RN   Basic metabolic panel     Status: Abnormal   Collection Time: 10/07/14  3:30 AM  Result Value Ref Range   Sodium 136 135 - 145 mmol/L   Potassium 4.6 3.5 - 5.1 mmol/L   Chloride 106 96 - 112 mmol/L   CO2 26 19 - 32 mmol/L   Glucose, Bld 108 (H) 70 - 99 mg/dL   BUN 25 (H) 6 - 23 mg/dL   Creatinine, Ser 1.79 (H) 0.50 - 1.35 mg/dL   Calcium 8.5 8.4 - 10.5 mg/dL   GFR calc non Af Amer 34 (L) >90 mL/min   GFR calc Af Amer 40 (L) >90 mL/min    Comment: (NOTE) The eGFR has been calculated using the CKD EPI equation. This calculation has not been validated in all clinical situations. eGFR's persistently <90 mL/min signify possible Chronic Kidney Disease.    Anion gap 4 (L) 5 - 15  Magnesium     Status: None   Collection Time: 10/07/14  3:30 AM  Result Value Ref Range   Magnesium 2.1 1.5 - 2.5 mg/dL  CBC     Status: Abnormal    Collection Time: 10/07/14  3:30 AM  Result Value Ref Range   WBC 8.4 4.0 - 10.5 K/uL   RBC 4.73 4.22 - 5.81 MIL/uL   Hemoglobin 12.7 (L) 13.0 - 17.0 g/dL   HCT 40.5 39.0 - 52.0 %   MCV 85.6 78.0 - 100.0 fL   MCH 26.8 26.0 - 34.0 pg   MCHC 31.4 30.0 - 36.0 g/dL   RDW 15.0 11.5 - 15.5 %   Platelets 144 (L) 150 - 400 K/uL    Dg Chest 2 View  10/06/2014   CLINICAL DATA:  Syncope tonight which shortness of breath  EXAM: CHEST  2 VIEW  COMPARISON:  07/02/2013  FINDINGS: Unchanged heart size and marked aortic tortuosity post CABG.  Low lung volumes interstitial crowding. There is no edema, consolidation, effusion, or pneumothorax.  There is chronic focal disc narrowing at T10-11 with subchondral erosive changes.  IMPRESSION: Stable low volume chest. No indication of acute cardiopulmonary disease.   Electronically Signed   By: Monte Fantasia M.D.   On: 10/06/2014 00:18   Nm Pulmonary Perf And Vent  10/06/2014   CLINICAL DATA:  Shortness of breath and syncope.  EXAM: NUCLEAR MEDICINE VENTILATION - PERFUSION LUNG SCAN  TECHNIQUE: Ventilation images were obtained in multiple projections using inhaled aerosol technetium 99 M DTPA. Perfusion images  were obtained in multiple projections after intravenous injection of Tc-37mMAA.  RADIOPHARMACEUTICALS:  6.0 mCi Tc-935mTPA aerosol and 40.0 mCi Tc-9954mA  COMPARISON:  Chest radiograph, 10/05/2014  FINDINGS: Ventilation: Inhalation somewhat heterogeneous, but no focal defect is seen.  Perfusion: No wedge shaped peripheral perfusion defects to suggest acute pulmonary embolism.  IMPRESSION: No evidence of a pulmonary embolism.   Electronically Signed   By: DavLajean ManesD.   On: 10/06/2014 17:08    Review of Systems  Constitutional: Positive for diaphoresis. Negative for fever.  HENT: Positive for congestion (Sinus).   Respiratory: Positive for shortness of breath.   Cardiovascular: Positive for leg swelling (Left left.  Old injury). Negative for chest pain,  palpitations and orthopnea.  Gastrointestinal: Negative for nausea, vomiting, abdominal pain, blood in stool and melena.  Genitourinary: Negative for hematuria.  Musculoskeletal: Negative for myalgias.  Neurological: Negative for dizziness.  All other systems reviewed and are negative.  Blood pressure 152/71, pulse 89, temperature 98.3 F (36.8 C), temperature source Oral, resp. rate 18, height '5\' 10"'  (1.778 m), weight 216 lb 4.3 oz (98.1 kg), SpO2 97 %. Physical Exam  Nursing note and vitals reviewed. Constitutional: He is oriented to person, place, and time. He appears well-developed and well-nourished.  HENT:  Head: Normocephalic and atraumatic.  Eyes: EOM are normal. Pupils are equal, round, and reactive to light.  Neck: Normal range of motion. Neck supple. No JVD present.  Cardiovascular: Normal rate, regular rhythm, S1 normal and S2 normal.   No murmur heard. Pulses:      Radial pulses are 2+ on the right side, and 2+ on the left side.       Dorsalis pedis pulses are 2+ on the right side, and 2+ on the left side.  Respiratory: Effort normal and breath sounds normal. He has no wheezes. He has no rales.  GI: Soft. He exhibits no distension. There is no tenderness.  Musculoskeletal: He exhibits edema (Trace LEE).  Neurological: He is alert and oriented to person, place, and time. He exhibits normal muscle tone.  Skin: Skin is warm and dry.  Psychiatric: He has a normal mood and affect.    Assessment/Plan:  Syncope  Ischemic Cardiomyopathy  LBBB  Sleep disordered breathing  CHF chronic systolic Class 2  Hypertension  High risk events by hx and in the context of both substrates, tachy with ICM and scar, and brady LBBB  Data suggest ICD is appropriate therapy;  EPS not necessary and with LBBB and the Hx I would proceed with CRT implantation  Have reviewed the potential benefits and risks of ICD implantation including but not limited to death, perforation of heart or  lung, lead dislodgement, infection,  device malfunction and inappropriate shocks.  The patient and family express understanding  and are willing to proceed.    He is scheduled for myoview in am, and anticipate this will be not high risk, and so will anticipate ICD implant on Monday  Needs home sleep study  Will anticipate increasing betablockers following device implant     HAGER, BRYAN, PAC 10/07/2014, 10:16 AM

## 2014-10-10 NOTE — CV Procedure (Signed)
BRUNO KIENHOLZ [id number]  ID:[id number]  Preop Dx: syncope ischemic cardiomyopathy, VT LBBB CHF  Postop Dx same/   Procedure: ICD + CRT  Cx: None  EBL: Minimal   Dictation number [id number]  Virl Axe, MD 10/10/2014 1:43 PM

## 2014-10-10 NOTE — Interval H&P Note (Signed)
ICD Criteria  Current LVEF:35% ;Obtained < 1 month ago.  NYHA Functional Classification: Class III  Heart Failure History:  Yes, Duration of heart failure since onset is > 9 months  Non-Ischemic Dilated Cardiomyopathy History:  No.  Atrial Fibrillation/Atrial Flutter:  No.  Ventricular Tachycardia History:  No.  Cardiac Arrest History:  No  History of Syndromes with Risk of Sudden Death:  Yes, Other  Syncope with depressed LV function  Previous ICD:  No.  Electrophysiology Study: No.  Prior MI: Yes, Most recent MI timeframe is > 40 days.  PPM: No.  OSA:  No  Patient Life Expectancy of >=1 year: Yes.  Anticoagulation Therapy:  Patient is NOT on anticoagulation therapy.   Beta Blocker Therapy:  Yes.   Ace Inhibitor/ARB Therapy:  Yes.History and Physical Interval Note:  10/10/2014 10:55 AM  Shane [name]  has presented today for surgery, with the diagnosis of syncope, left bundle branch block  The various methods of treatment have been discussed with the patient and family. After consideration of risks, benefits and other options for treatment, the patient has consented to  Procedure(s): BI-VENTRICULAR IMPLANTABLE CARDIOVERTER DEFIBRILLATOR  (CRT-D) (N/A) as a surgical intervention .  The patient's history has been reviewed, patient examined, no change in status, stable for surgery.  I have reviewed the patient's chart and labs.  Questions were answered to the patient's satisfaction.     Virl Axe

## 2014-10-11 ENCOUNTER — Telehealth: Payer: Self-pay | Admitting: Cardiology

## 2014-10-11 ENCOUNTER — Inpatient Hospital Stay (HOSPITAL_COMMUNITY): Payer: Medicare Other

## 2014-10-11 LAB — GLUCOSE, CAPILLARY
GLUCOSE-CAPILLARY: 101 mg/dL — AB (ref 70–99)
Glucose-Capillary: 114 mg/dL — ABNORMAL HIGH (ref 70–99)

## 2014-10-11 MED ORDER — CARVEDILOL 25 MG PO TABS: 25.0000 mg | ORAL_TABLET | Freq: Two times a day (BID) | ORAL | Status: DC

## 2014-10-11 NOTE — Progress Notes (Signed)
Patient Name: Shane [name]      SUBJECTIVE:sore sjcoulder   Past Medical History  Diagnosis Date  . Coronary artery disease     remote CABG in 1985, cath in 2003 by Dr. Lia Foyer with no PCI, last nuclear in 2010 showing scar and EF of 28%.   . Dyslipidemia   . Hypercholesterolemia   . Left ventricular dysfunction     28% per nuclear in 2010 and 35 to 40% per echo in 2010  . HTN (hypertension)   . Diabetes mellitus   . Cellulitis of arm, left     MSSA  . Spinal stenosis   . BPH (benign prostatic hyperplasia)   . Allergic rhinitis 01-08-13    Uses nebulizer for chronic sinus issues and Mucinex.  . Abnormal nuclear cardiac imaging test Nov 2010    moderate area of infarct in the inferior wall with only minimal reversibility and EF of 28%  . Myocardial infarction     1985  . Pneumonia     hx of several times years ago   . Blood transfusion     ? at time of bypass surgery   . Arthritis     osteoarthritis   . Colon polyps     adenomatous  . Neuromuscular disorder     legs mild paralysis-able to walk"nerve damage"-legs- left leg brace   . CKD (chronic kidney disease), stage III     "was told due to meds he takes"-no Renal workup done  . Glaucoma 01-08-13    tx. eye drops    Scheduled Meds:  Scheduled Meds: . amoxicillin  500 mg Oral TID  . aspirin EC  81 mg Oral Daily  . atorvastatin  10 mg Oral Daily  . azelastine  2 spray Each Nare BID  . carvedilol  25 mg Oral BID WC  . heparin  5,000 Units Subcutaneous 3 times per day  . irbesartan  300 mg Oral Daily   And  . hydrochlorothiazide  12.5 mg Oral Daily  . latanoprost  1 drop Both Eyes QHS  . spironolactone  25 mg Oral QODAY   Continuous Infusions: . sodium chloride 50 mL/hr at 10/10/14 1048  . sodium chloride 50 mL/hr at 10/10/14 1048   acetaminophen, nitroGLYCERIN, ondansetron (ZOFRAN) IV, zolpidem    PHYSICAL EXAM Filed Vitals:   10/09/14 2017 10/10/14 0615 10/10/14 2023 10/11/14 0452  BP:  146/85 146/90 150/76 147/85  Pulse: 82 78 80 68  Temp: 98.7 F (37.1 C) 98.5 F (36.9 C) 99 F (37.2 C) 97.9 F (36.6 C)  TempSrc: Oral Oral Oral Oral  Resp: 18 18 18 20   Height:      Weight:  206 lb 5.6 oz (93.6 kg)  207 lb 0.2 oz (93.9 kg)  SpO2: 98% 97% 95% 96%    Well developed and nourished in no acute distress HENT normal Neck supple with JVP-flat Clear Regular rate and rhythm, no murmurs or gallops Abd-soft with active BS No Clubbing cyanosis edema Skin-warm and dry A & Oriented  Grossly normal sensory and motor function   TELEMETRY: Reviewed telemetry pt in  snr    Intake/Output Summary (Last 24 hours) at 10/11/14 0833 Last data filed at 10/11/14 0546  Gross per 24 hour  Intake    460 ml  Output      0 ml  Net    460 ml    LABS: Basic Metabolic Panel:  Recent Labs Lab 10/05/14 2153  10/06/14 0239 10/06/14 0815 10/07/14 0330 10/10/14 0522  NA 133*  --  137 136 137  K 3.7  --  4.0 4.6 3.8  CL 102  --  104 106 105  CO2 22  --  26 26 26   GLUCOSE 171*  --  111* 108* 119*  BUN 30*  --  27* 25* 32*  CREATININE 1.89* 1.97* 1.80* 1.79* 1.77*  CALCIUM 8.5  --  8.5 8.5 8.6  MG  --  2.0  --  2.1  --    Cardiac Enzymes: No results for input(s): CKTOTAL, CKMB, CKMBINDEX, TROPONINI in the last 72 hours. CBC:  Recent Labs Lab 10/05/14 2153 10/06/14 0239 10/07/14 0330  WBC 6.3 6.7 8.4  HGB 12.9* 12.4* 12.7*  HCT 41.1 40.1 40.5  MCV 86.2 86.8 85.6  PLT 131* 138* 144*   PROTIME: No results for input(s): LABPROT, INR in the last 72 hours. Liver Function Tests: No results for input(s): AST, ALT, ALKPHOS, BILITOT, PROT, ALBUMIN in the last 72 hours. No results for input(s): LIPASE, AMYLASE in the last 72 hours. BNP: BNP (last 3 results) No results for input(s): BNP in the last 8760 hours.     Device Interrogation: normal decvice function*   ASSESSMENT AND PLAN:  Active Problems:   Ischemic cardiomyopathy   Syncope   LBBB (left bundle branch  block) discharge to home Needs f/u 3-4 weeks with Pcardiologsit And me in 36months   Signed, Virl Axe MD  10/11/2014

## 2014-10-11 NOTE — Progress Notes (Signed)
CARE MANAGEMENT NOTE 10/11/2014  Patient:  Shane [name], Shane [name]   Account Number:  [id number]  Date Initiated:  10/07/2014  Documentation initiated by:  Marvetta Gibbons  Subjective/Objective Assessment:   Pt admitted with syncope     Action/Plan:   PTA Pt lived at home- NCM to follow for d/c needs   Anticipated DC Date:  10/11/2014   Anticipated DC Plan:  [place]  CM consult      Choice offered to / List presented to:             Status of service:  Completed, signed off Medicare Important Message given?  YES (If response is "NO", the following Medicare IM given date fields will be blank) Date Medicare IM given:  10/11/2014 Medicare IM given by:  [facility] Date Additional Medicare IM given:   Additional Medicare IM given by:    Discharge Disposition:  HOME/SELF CARE  Per UR Regulation:  Reviewed for med. necessity/level of care/duration of stay  If discussed at [place] of Stay Meetings, dates discussed:    Comments:  10/11/2014 1000 NCM spoke to pt. Lives at home with wife. No NCM needs identified. Shane Finner RN CCM Case Mgmt phone [phone]  10/07/14- [name] RN, BSN [phone] scheduled for myoview in am, and anticipate this will be not high risk, and so will anticipate ICD implant on Monday

## 2014-10-11 NOTE — Discharge Summary (Signed)
ELECTROPHYSIOLOGY PROCEDURE DISCHARGE SUMMARY    Patient ID: Shane [name],  MRN: [id number], DOB/AGE: [birth year] 81 y.o.  Admit date: 10/05/2014 Discharge date: 10/11/2014  Primary Care Physician: Darlin Coco, MD Electrophysiologist: Caryl Comes  Primary Discharge Diagnosis:  Ischemic cardiomyopathy, VT, LBBB, and syncope status post CRTD implantation this admission  Secondary Discharge Diagnosis:  1.  CAD s/p CABG 2.  Hyperlipidemia 3.  Hypertension 4.  Diabetes 5.  CKD 6.  BPH 7.  Osteoarthritis  Allergies  Allergen Reactions  . Codeine     anxiety  . Hydrocodone     anxiety  . Azithromycin Rash     Procedures This Admission:  1.  Implantation of a MDT CRTD on 10-10-14 by Dr Caryl Comes.  The patient received a MDT model number Viva XT ICD with model number 5076 right atrial lead, [id number] right ventricular lead, and 4598 left ventricular lead.  DFT's were deferred at time of implant. There were no immediate post procedure complications. 2.  CXR on 10-11-14 demonstrated no pneumothorax status post device implantation.   Brief HPI/Hospital Course:  Shane [name] is a 81 y.o. male with a past medical history as outlined above.  He was admitted on 10-05-14 following an episode of syncope at home.  Due to ischemic cardiomyopathy, NSVT, and LBBB, there was concern for arrhythmic syncope.  Myoview demonstrated scar but no ischemia.  He was evaluated by Dr Caryl Comes who recommended CRTD implantation.  Risks, benefits, and alternatives to ICD implantation were reviewed with the patient who wished to proceed.   The patient underwent implantation of a MDT CRTD with details as outlined above. He was monitored on telemetry overnight which demonstrated sinus rhythm with ventricular pacing, PVC's, short run atrial tach.  Left chest was without hematoma or ecchymosis.  The device was interrogated and found to be functioning normally.  CXR was obtained and demonstrated no pneumothorax status post  device implantation.  Wound care, arm mobility, and restrictions were reviewed with the patient.  The patient was examined and considered stable for discharge to home.   No driving for 6 months, patient aware.   The patient's discharge medications include an ARB (Valsartan) and beta blocker (Carvedilol).   Physical Exam: Filed Vitals:   10/09/14 2017 10/10/14 0615 10/10/14 2023 10/11/14 0452  BP: 146/85 146/90 150/76 147/85  Pulse: 82 78 80 68  Temp: 98.7 F (37.1 C) 98.5 F (36.9 C) 99 F (37.2 C) 97.9 F (36.6 C)  TempSrc: Oral Oral Oral Oral  Resp: 18 18 18 20   Height:      Weight:  206 lb 5.6 oz (93.6 kg)  207 lb 0.2 oz (93.9 kg)  SpO2: 98% 97% 95% 96%    GEN- The patient is well appearing, alert and oriented x 3 today.   HEENT: normocephalic, atraumatic; sclera clear, conjunctiva pink; hearing intact; oropharynx clear; neck supple  Lymph- no cervical lymphadenopathy Lungs- Clear to ausculation bilaterally, normal work of breathing.  No wheezes, rales, rhonchi Heart- Regular rate and rhythm, no murmurs, rubs or gallops, PMI not laterally displaced GI- soft, non-tender, non-distended, bowel sounds present, no hepatosplenomegaly Extremities- no clubbing, cyanosis, or edema; DP/PT/radial pulses 2+ bilaterally MS- no significant deformity or atrophy Skin- warm and dry, no rash or lesion, left chest without hematoma/ecchymosis Psych- euthymic mood, full affect Neuro- strength and sensation are intact   Labs:   Lab Results  Component Value Date   WBC 8.4 10/07/2014   HGB 12.7* 10/07/2014   HCT 40.5  10/07/2014   MCV 85.6 10/07/2014   PLT 144* 10/07/2014     Recent Labs Lab 10/10/14 0522  NA 137  K 3.8  CL 105  CO2 26  BUN 32*  CREATININE 1.77*  CALCIUM 8.6  GLUCOSE 119*    Discharge Medications:    Medication List    STOP taking these medications        amoxicillin 500 MG capsule  Commonly known as:  AMOXIL     fluocinonide cream 0.05 %  Commonly  known as:  LIDEX     metoprolol succinate 50 MG 24 hr tablet  Commonly known as:  TOPROL-XL     pseudoephedrine 30 MG tablet  Commonly known as:  SUDAFED      TAKE these medications        acetaminophen 500 MG tablet  Commonly known as:  TYLENOL  Take 1,000 mg by mouth as needed for pain.     aspirin EC 81 MG tablet  Take 81 mg by mouth every morning.     atorvastatin 10 MG tablet  Commonly known as:  LIPITOR  TAKE 1 TABLET DAILY     b complex vitamins tablet  Take 1 tablet by mouth daily.     carvedilol 25 MG tablet  Commonly known as:  COREG  Take 1 tablet (25 mg total) by mouth 2 (two) times daily with a meal.     CIALIS 20 MG tablet  Generic drug:  tadalafil  Take 20 mg by mouth daily as needed for erectile dysfunction.     dextromethorphan-guaiFENesin 30-600 MG per 12 hr tablet  Commonly known as:  MUCINEX DM  Take 1 tablet by mouth 2 (two) times daily as needed for cough.     diphenhydrAMINE 25 mg capsule  Commonly known as:  BENADRYL  Take 25 mg by mouth every 6 (six) hours as needed for allergies.     EPINEPHrine 0.3 mg/0.3 mL Soaj injection  Commonly known as:  EPIPEN  Inject 0.3 mLs (0.3 mg total) into the muscle once.     latanoprost 0.005 % ophthalmic solution  Commonly known as:  XALATAN  Place 1 drop into both eyes at bedtime.     metFORMIN 500 MG tablet  Commonly known as:  GLUCOPHAGE  TAKE ONE TABLET BY MOUTH ONCE DAILY     multivitamin tablet  Take 1 tablet by mouth daily.     naproxen sodium 220 MG tablet  Commonly known as:  ANAPROX  Take 220 mg by mouth daily as needed (back pain).     NASONEX 50 MCG/ACT nasal spray  Generic drug:  mometasone  USE 2 SPRAYS IN THE NOSE DAILY     NIASPAN 1000 MG CR tablet  Generic drug:  niacin  Take 1 tablet (1,000 mg total) by mouth 2 (two) times daily.     NORVASC 10 MG tablet  Generic drug:  amLODipine  TAKE 1 TABLET DAILY     omalizumab 150 MG injection  Commonly known as:  XOLAIR    Inject 150 mg into the skin every 14 (fourteen) days.     spironolactone 25 MG tablet  Commonly known as:  ALDACTONE  Take 1 tablet (25 mg total) by mouth every other day.     testosterone cypionate 100 MG/ML injection  Commonly known as:  DEPOTESTOTERONE CYPIONATE  Inject 100 mg into the muscle every 14 (fourteen) days. For IM use only     valsartan-hydrochlorothiazide 320-12.5 MG per tablet  Commonly known  as:  DIOVAN-HCT  TAKE 1 TABLET DAILY     Vitamin D-3 1000 UNITS Caps  Take 2,000 Units by mouth daily.     zolpidem 10 MG tablet  Commonly known as:  AMBIEN  TAKE ONE-HALF TO ONE TABLET BY MOUTH AT BEDTIME AS NEEDED FOR SLEEP        Disposition:   Follow-up Information    Follow up with Darlin Coco, MD On 12/15/2014.   Specialty:  Cardiology   Why:  at [facility] information:   [address] [place] [address] [phone]       Follow up with Virl Axe, MD On 01/17/2015.   Specialty:  Cardiology   Why:  at 11:45AM   Contact information:   [address] [address] [phone]       Follow up with Truitt Merle, NP.   Specialty:  Nurse Practitioner   Contact information:   [address] [place] [address] [phone]       Duration of Discharge Encounter: Greater than 30 minutes including physician time.  Signed, Chanetta Marshall, NP 10/11/2014 8:52 AM

## 2014-10-11 NOTE — Telephone Encounter (Signed)
New message    TCM appt is  3.21.2016 @ 9:00 am     Per [employer]

## 2014-10-11 NOTE — Op Note (Signed)
NAME:  Shane [name], Shane [name] NO.:  [id number]  MEDICAL RECORD NO.:  [id number]  LOCATION:  [id number]                        FACILITY:  [facility]  PHYSICIAN:  [name], MD, FACCDATE OF BIRTH:  12-29-1933  DATE OF PROCEDURE:  10/10/2014 DATE OF DISCHARGE:                              OPERATIVE REPORT   PREOPERATIVE DIAGNOSES:  Ischemic cardiomyopathy, congestive heart failure, left bundle-branch block, syncope, and ventricular tachycardia.  POSTOPERATIVE DIAGNOSES:  Ischemic cardiomyopathy, congestive heart failure, left bundle-branch block, syncope, and ventricular tachycardia.  PROCEDURE:  Dual-chamber defibrillator implantation with left ventricular lead placement.  Following obtaining informed consent, the patient was brought to Electrophysiology Laboratory and placed on the fluoroscopic table in supine position.  After routine prep and drape of the left upper chest, lidocaine was infiltrated in prepectoral subclavicular region.  Incision was made and carried down to layer of the prepectoral fascia using electrocautery and sharp dissection.  A pocket was formed similarly. Hemostasis was obtained.  Thereafter attention was turned to gain access to the extrathoracic left subclavian vein which was accomplished without difficulty without the aspiration of air.  I did puncture the artery on 1 occasion, and pressure was held for 2 minutes.  We then subsequently got all 3 venipunctures accomplished.  Guidewires were placed and retained and sequentially a 9-French, 9.5-French, and 7- French sheaths were placed which was passed a Medtronic 6935 62 cm active fixation, ventricular lead serial number [id number].  A Saint Jude, coronary sinus, 135 cannulation system and a Medtronic 5076 52 cm active fixation atrial lead, serial number [id number].  Under fluoroscopic guidance, the RV lead was manipulated to the right ventricular flow closely repositioned in a more apical  location in this lateral location was 8.9 with an amplitude impedance was 751, threshold was 1.1 V at 0.5 milliseconds, current of threshold 0.4 mA.  There is no diaphragmatic pacing at 10 V and the current of injury was brisk.  This lead was secured to the prepectoral fascia.  This repositioning occurred following the deployment of the LV lead.  In-between the positions of the RV lead, the coronary sinus was cannulated without difficulty.  Contrast venogram demonstrated a high lateral branch, which was then targeted and a whisper wire to allow for the passage of a Medtronic 4598 88 cm passive lead serial [id number] could be deployed to the junction between the mid and distal thirds.  In this location, the 2-3 configuration had an impedance of 535, threshold 1 V at 0.5 milliseconds.  There was some subsequent reposition of the lead and the final parameters were little bit higher (left stable low).  Following the deployment of the LV lead, the right atrial lead was going to be placed but because of the stability of the sheath, we secured the LV lead.  We then deployed the right atrial lead into the appendage where the appendage remnant where the amplitude was 3.5 with a pace impedance of 817, threshold 1.3 at 0.5 milliseconds.  This lead was also secured to the prepectoral fascia.  The pocket was copiously irrigated with antibiotic containing saline solution and the leads and the pulse generator were then attached to Medtronic Viva pulse generator serial [id number].  Through the device, bipolar Q-wave was 3 with a pace impedance of 456, a threshold 0.75 at 0.4.  The RV was 12 with a pace impedance of 779, a threshold 0.75 at 0.4, and the LV impedance was 437 threshold 1.75 at 0.4 and to the RV coil configuration.  High-voltage impedance was 72 ohms.  The device was implanted.  The pocket was copiously irrigated with antibiotic containing saline solution.  Hemostasis was assured.   The leads and pulse generator were placed in the pocket and secured to the prepectoral fascia.  The wound was closed in 2 layers in normal fashion. The wound was washed and dried and a Dermabond dressing was applied. Needle counts, sponge counts, and instrument counts were correct at the end of the procedure according to the staff.  The patient tolerated the procedure without apparent complication.     [name], MD, [facility]     SCK/MEDQ  D:  10/10/2014  T:  10/11/2014  Job:  [id number]

## 2014-10-11 NOTE — Discharge Instructions (Signed)
° ° °  Supplemental Discharge Instructions for  Pacemaker/Defibrillator Patients  Activity No heavy lifting or vigorous activity with your left/right arm for 6 to 8 weeks.  Do not raise your left/right arm above your head for one week.  Gradually raise your affected arm as drawn below.           __           10-14-14                10-15-14                    10-16-14                 10-17-14  NO DRIVING for  6 months    WOUND CARE - Keep the wound area clean and dry.   No showers 3 days  - The glue on your wound will fall off; do not pull them off.  No bandage is needed on the site.  DO  NOT apply any creams, oils, or ointments to the wound area. - If you notice any drainage or discharge from the wound, any swelling or bruising at the site, or you develop a fever > 101? F after you are discharged home, call the office at once.  Special Instructions - You are still able to use cellular telephones; use the ear opposite the side where you have your pacemaker/defibrillator.  Avoid carrying your cellular phone near your device. - When traveling through airports, show security personnel your identification card to avoid being screened in the metal detectors.  Ask the security personnel to use the hand wand. - Avoid arc welding equipment, MRI testing (magnetic resonance imaging), TENS units (transcutaneous nerve stimulators).  Call the office for questions about other devices. - Avoid electrical appliances that are in poor condition or are not properly grounded. - Microwave ovens are safe to be near or to operate.  Additional information for defibrillator patients should your device go off: - If your device goes off ONCE and you feel fine afterward, notify the device clinic nurses. - If your device goes off ONCE and you do not feel well afterward, call 911. - If your device goes off TWICE, call 911. - If your device goes off THREE times in one day, call 911.  DO NOT DRIVE YOURSELF OR A FAMILY  MEMBER WITH A DEFIBRILLATOR TO THE HOSPITAL--CALL 911.

## 2014-10-12 NOTE — Telephone Encounter (Signed)
Patient contacted regarding discharge [facility] on October 11, 2014.   Patient understands to follow up with Truitt Merle, NP on 10/24/14 at 9:00 am at [facility]   Patient understands discharge instructions? Yes  Patient understands medications and regiment? Yes Patient understands to bring all medications to this visit? He will bring his list.  Device incision site is clean, dry, no redness according to patient.  Has steri strips in place.  Provided  instructions to clean daily gently with clean washcloth and antibacterial soap, pat dry.  Do not soak.  His niacin was decreased to daily by Dr. Mare Ferrari in the past--"Dr. Mare Ferrari thought that was all I needed". His DC papers instruct him to take niacin BID.  Instructed him to continue to take it daily, and bring this question with him to his follow up.  Pt verbalizes understanding and agreement and is appreciative for the call.

## 2014-10-17 ENCOUNTER — Telehealth: Payer: Self-pay | Admitting: Cardiology

## 2014-10-17 DIAGNOSIS — E291 Testicular hypofunction: Secondary | ICD-10-CM | POA: Diagnosis not present

## 2014-10-17 NOTE — Telephone Encounter (Signed)
Pt called, is having orthostatic intolerance on coreg 25mg  twice daily. Advised ok to decrease to 12.5mg  twice daily to see if better tolerated.  He has wound check appt/TOC next week.    Chanetta Marshall, NP 10/17/2014 3:08 PM

## 2014-10-24 ENCOUNTER — Encounter: Payer: Self-pay | Admitting: Nurse Practitioner

## 2014-10-24 ENCOUNTER — Ambulatory Visit (INDEPENDENT_AMBULATORY_CARE_PROVIDER_SITE_OTHER): Payer: Medicare Other | Admitting: *Deleted

## 2014-10-24 ENCOUNTER — Encounter: Payer: Self-pay | Admitting: Internal Medicine

## 2014-10-24 ENCOUNTER — Ambulatory Visit (INDEPENDENT_AMBULATORY_CARE_PROVIDER_SITE_OTHER): Payer: Medicare Other | Admitting: Nurse Practitioner

## 2014-10-24 ENCOUNTER — Ambulatory Visit (INDEPENDENT_AMBULATORY_CARE_PROVIDER_SITE_OTHER): Payer: Medicare Other

## 2014-10-24 VITALS — BP 150/84 | HR 68 | Ht 70.0 in | Wt 216.6 lb

## 2014-10-24 DIAGNOSIS — I472 Ventricular tachycardia, unspecified: Secondary | ICD-10-CM

## 2014-10-24 DIAGNOSIS — Z95810 Presence of automatic (implantable) cardiac defibrillator: Secondary | ICD-10-CM

## 2014-10-24 DIAGNOSIS — I255 Ischemic cardiomyopathy: Secondary | ICD-10-CM | POA: Diagnosis not present

## 2014-10-24 DIAGNOSIS — J4540 Moderate persistent asthma, uncomplicated: Secondary | ICD-10-CM | POA: Diagnosis not present

## 2014-10-24 LAB — MDC_IDC_ENUM_SESS_TYPE_INCLINIC
Battery Remaining Longevity: 86 mo
Battery Voltage: 3.12 V
Brady Statistic AP VS Percent: 0.05 %
Brady Statistic AS VP Percent: 96.37 %
Date Time Interrogation Session: 20160321112237
HIGH POWER IMPEDANCE MEASURED VALUE: 171 Ohm
HIGH POWER IMPEDANCE MEASURED VALUE: 55 Ohm
Lead Channel Impedance Value: 494 Ohm
Lead Channel Pacing Threshold Amplitude: 0.75 V
Lead Channel Pacing Threshold Amplitude: 2.25 V
Lead Channel Pacing Threshold Pulse Width: 0.4 ms
Lead Channel Pacing Threshold Pulse Width: 0.6 ms
Lead Channel Pacing Threshold Pulse Width: 1 ms
Lead Channel Setting Pacing Amplitude: 3.5 V
Lead Channel Setting Pacing Pulse Width: 1 ms
Lead Channel Setting Sensing Sensitivity: 0.3 mV
MDC IDC MSMT LEADCHNL RA SENSING INTR AMPL: 3.375 mV
MDC IDC MSMT LEADCHNL RV IMPEDANCE VALUE: 456 Ohm
MDC IDC MSMT LEADCHNL RV PACING THRESHOLD AMPLITUDE: 1.5 V
MDC IDC MSMT LEADCHNL RV SENSING INTR AMPL: 15.25 mV
MDC IDC SET LEADCHNL LV PACING AMPLITUDE: 3.25 V
MDC IDC SET LEADCHNL LV PACING PULSEWIDTH: 0.6 ms
MDC IDC SET LEADCHNL RA PACING AMPLITUDE: 3.5 V
MDC IDC SET ZONE DETECTION INTERVAL: 350 ms
MDC IDC STAT BRADY AP VP PERCENT: 1.44 %
MDC IDC STAT BRADY AS VS PERCENT: 2.13 %
MDC IDC STAT BRADY RA PERCENT PACED: 1.49 %
MDC IDC STAT BRADY RV PERCENT PACED: 0.13 %
Zone Setting Detection Interval: 300 ms
Zone Setting Detection Interval: 400 ms
Zone Setting Detection Interval: 400 ms

## 2014-10-24 MED ORDER — CARVEDILOL 12.5 MG PO TABS: 18.7500 mg | ORAL_TABLET | Freq: Two times a day (BID) | ORAL | Status: DC

## 2014-10-24 NOTE — Progress Notes (Signed)
CARDIOLOGY OFFICE NOTE  Date:  10/24/2014    Shane [name] Date of Birth: 11-Apr-1934 Medical Record [id number]  PCP:  Darlin Coco, MD  Cardiologist:  Mare Ferrari    Chief Complaint  Patient presents with  . VT - s/p CRTD implant.    Post hospital/TOC - seen for Dr. Mare Ferrari & Caryl Comes     History of Present Illness: Shane [name] is a 81 y.o. male who presents today for a TCM/post hospital visit. Seen for Dr. Mare Ferrari. He has an ischemic CM, CAD with prior CABG, HLD, HTN, DM, CK and OA.  Presented with VT in the setting of LBBB and syncope - had CRTD implanted. Myoview demonstrated scar but no ischemia. He was advised about no driving for 6 months. ACE and BB continued but he was switched from Toprol to Coreg.   Comes back today. Here with his wife. He is doing ok. Felt a little sluggish and lightheaded - cut his Coreg back to 12.5 BID and felt better. No chest pain. Breathing is ok. No shocks. Understands about the not driving for 6 months. No fever or chills. Overall, seems ok.   Past Medical History  Diagnosis Date  . Coronary artery disease     remote CABG in 1985, cath in 2003 by Dr. Lia Foyer with no PCI, last nuclear in 2010 showing scar and EF of 28%.   . Dyslipidemia   . Hypercholesterolemia   . Left ventricular dysfunction     28% per nuclear in 2010 and 35 to 40% per echo in 2010  . HTN (hypertension)   . Diabetes mellitus   . Cellulitis of arm, left     MSSA  . Spinal stenosis   . BPH (benign prostatic hyperplasia)   . Allergic rhinitis 01-08-13    Uses nebulizer for chronic sinus issues and Mucinex.  . Abnormal nuclear cardiac imaging test Nov 2010    moderate area of infarct in the inferior wall with only minimal reversibility and EF of 28%  . Myocardial infarction     1985  . Pneumonia     hx of several times years ago   . Blood transfusion     ? at time of bypass surgery   . Arthritis     osteoarthritis   . Colon polyps     adenomatous  .  Neuromuscular disorder     legs mild paralysis-able to walk"nerve damage"-legs- left leg brace   . CKD (chronic kidney disease), stage III     "was told due to meds he takes"-no Renal workup done  . Glaucoma 01-08-13    tx. eye drops    Past Surgical History  Procedure Laterality Date  . Cardiac catheterization  2003  . Lumbar disc surgery    . Prostate surgery    . Back surgery      hx of back surgery x 4   . Total knee arthroplasty  08/01/2011    Procedure: TOTAL KNEE ARTHROPLASTY;  Surgeon: Johnn Hai;  Location: [facility];  Service: Orthopedics;  Laterality: Right;  . Cataract       recent cataract surgery 6'14  . Coronary artery bypass graft  1985    x 5 vessels  . Colonoscopy N/A 01/25/2013    Procedure: COLONOSCOPY;  Surgeon: Irene Shipper, MD;  Location: [facility];  Service: Endoscopy;  Laterality: N/A;  . Bi-ventricular implantable cardioverter defibrillator N/A 10/10/2014    Procedure: BI-VENTRICULAR IMPLANTABLE CARDIOVERTER DEFIBRILLATOR  (CRT-D);  Surgeon: [name], MD;  Location: [facility] CATH LAB;  Service: Cardiovascular;  Laterality: N/A;     Medications: Current Outpatient Prescriptions  Medication Sig Dispense Refill  . acetaminophen (TYLENOL) 500 MG tablet Take 1,000 mg by mouth as needed for pain.     Marland Kitchen aspirin EC 81 MG tablet Take 81 mg by mouth every morning.    Marland Kitchen atorvastatin (LIPITOR) 10 MG tablet TAKE 1 TABLET DAILY 90 tablet 0  . b complex vitamins tablet Take 1 tablet by mouth daily.    . carvedilol (COREG) 12.5 MG tablet Take 1.5 tablets (18.75 mg total) by mouth 2 (two) times daily. 270 tablet 3  . Cholecalciferol (VITAMIN D-3) 1000 UNITS CAPS Take 2,000 Units by mouth daily.    Marland Kitchen CIALIS 20 MG tablet Take 20 mg by mouth daily as needed for erectile dysfunction.     Marland Kitchen dextromethorphan-guaiFENesin (MUCINEX DM) 30-600 MG per 12 hr tablet Take 1 tablet by mouth 2 (two) times daily as needed for cough.     . diphenhydrAMINE (BENADRYL) 25 mg capsule Take  25 mg by mouth every 6 (six) hours as needed for allergies.     Marland Kitchen EPINEPHrine (EPIPEN) 0.3 mg/0.3 mL SOAJ injection Inject 0.3 mLs (0.3 mg total) into the muscle once. 2 Device 0  . latanoprost (XALATAN) 0.005 % ophthalmic solution Place 1 drop into both eyes at bedtime.     . metFORMIN (GLUCOPHAGE) 500 MG tablet TAKE ONE TABLET BY MOUTH ONCE DAILY 90 tablet 3  . Multiple Vitamin (MULTIVITAMIN) tablet Take 1 tablet by mouth daily.    . naproxen sodium (ANAPROX) 220 MG tablet Take 220 mg by mouth daily as needed (back pain).    . NASONEX 50 MCG/ACT nasal spray USE 2 SPRAYS IN THE NOSE DAILY 1 g 2  . NIASPAN 1000 MG CR tablet Take 1 tablet (1,000 mg total) by mouth 2 (two) times daily. (Patient taking differently: Take 1,000 mg by mouth daily. ) 180 tablet 3  . NORVASC 10 MG tablet TAKE 1 TABLET DAILY 90 tablet 2  . omalizumab (XOLAIR) 150 MG injection Inject 150 mg into the skin every 14 (fourteen) days.    Marland Kitchen spironolactone (ALDACTONE) 25 MG tablet Take 1 tablet (25 mg total) by mouth every other day. 45 tablet 3  . testosterone cypionate (DEPOTESTOTERONE CYPIONATE) 100 MG/ML injection Inject 100 mg into the muscle every 14 (fourteen) days. For IM use only    . valsartan-hydrochlorothiazide (DIOVAN-HCT) 320-12.5 MG per tablet TAKE 1 TABLET DAILY 90 tablet 0  . zolpidem (AMBIEN) 10 MG tablet TAKE ONE-HALF TO ONE TABLET BY MOUTH AT BEDTIME AS NEEDED FOR SLEEP 30 tablet 5   No current facility-administered medications for this visit.    Allergies: Allergies  Allergen Reactions  . Codeine     anxiety  . Hydrocodone     anxiety  . Azithromycin Rash    Social History: The patient  reports that he quit smoking about 56 years ago. His smoking use included Cigarettes, Pipe, and Cigars. He has a 5 pack-year smoking history. He has never used smokeless tobacco. He reports that he does not drink alcohol or use illicit drugs.   Family History: The patient's family history includes Bone cancer in  his brother; Colon polyps in his father; Diabetes in his sister; Heart attack in his father; Heart disease in his brother and father; Lung cancer in his mother.   Review of Systems: Please see the history of present illness.  Otherwise, the review of systems is positive for irregular heart beats.   All other systems are reviewed and negative.   Physical Exam: VS:  BP 150/84 mmHg  Pulse 68  Ht 5\' 10"  (1.778 m)  Wt 216 lb 9.6 oz (98.249 kg)  BMI 31.08 kg/m2 .  BMI Body mass index is 31.08 kg/(m^2).  Wt Readings from Last 3 Encounters:  10/24/14 216 lb 9.6 oz (98.249 kg)  10/11/14 207 lb 0.2 oz (93.9 kg)  07/26/14 219 lb (99.338 kg)    General: Pleasant. Well developed, well nourished and in no acute distress.  HEENT: Normal. Neck: Supple, no JVD, carotid bruits, or masses noted.  Cardiac: Regular rate and rhythm. Occasion ectopic noted. No murmurs, rubs, or gallops. No edema. His ICD in the left upper chest looks good - no signs of infection.  Respiratory:  Lungs are clear to auscultation bilaterally with normal work of breathing.  GI: Soft and nontender.  MS: No deformity or atrophy. Gait and ROM intact. Skin: Warm and dry. Color is normal.  Neuro:  Strength and sensation are intact and no gross focal deficits noted.  Psych: Alert, appropriate and with normal affect.   LABORATORY DATA:  EKG:  EKG is not ordered today.   Lab Results  Component Value Date   WBC 8.4 10/07/2014   HGB 12.7* 10/07/2014   HCT 40.5 10/07/2014   PLT 144* 10/07/2014   GLUCOSE 119* 10/10/2014   CHOL 105 07/26/2014   TRIG 102.0 07/26/2014   HDL 24.10* 07/26/2014   LDLCALC 61 07/26/2014   ALT 24 07/26/2014   AST 23 07/26/2014   NA 137 10/10/2014   K 3.8 10/10/2014   CL 105 10/10/2014   CREATININE 1.77* 10/10/2014   BUN 32* 10/10/2014   CO2 26 10/10/2014   INR 1.07 10/05/2014   HGBA1C 6.0* 10/06/2014    BNP (last 3 results) No results for input(s): BNP in the last 8760 hours.  ProBNP  (last 3 results) No results for input(s): PROBNP in the last 8760 hours.   Other Studies Reviewed Today:  Echo Study Conclusions from 10/2014  - Left ventricle: The cavity size was mildly dilated. Wall thickness was normal. Systolic function was moderately reduced. The estimated ejection fraction was in the range of 35% to 40%. Akinesis and scarring of the basal-midinferolateral, inferior, and inferoseptal myocardium. Doppler parameters are consistent with restrictive physiology, indicative of decreased left ventricular diastolic compliance and/or increased left atrial pressure. - Ventricular septum: Septal motion showed paradox. - Mitral valve: There was moderate regurgitation directed centrally. - Left atrium: The atrium was moderately dilated. - Pulmonary arteries: PA peak pressure: 47 mm Hg (S).   MYOVIEW FINDINGS FROM 10/2014: Perfusion: Decreased activity is seen in the left ventricle inferior an inferoapical walls on both stress and rest images, consistent with myocardial infarction. No reversible myocardial perfusion defects identified.  Wall Motion: Severe left ventricular inferior wall hypokinesis and left ventricular dilatation noted.  Left Ventricular Ejection Fraction: 22 %  End diastolic volume [id number] ml  End systolic volume [id number] ml  IMPRESSION: 1. No reversible ischemia. Large fixed inferior an inferoapical wall defect, consistent with myocardial infarction.  2. Severe inferior wall hypokinesis and left ventricular dilatation.  3. Left ventricular ejection fraction 22%  4. High-risk stress test findings*.   Electronically Signed  By: Earle Gell M.D.  On: 10/08/2014 12:02   Assessment/Plan: 1. Syncope - noted to have had VT with LBBB - now s/p CRTD implant - no recurrence - checking  device today.   2. Ischemic CM - scar on Myoview but no ischemia. No chest pain. Will try to increase his Coreg to 18.75 mg BID.   3.  Systolic HF - looks compensated  4. CAD - no active chest pain  5. HTN - recheck by me is 160/80. Will try to increase the Coreg.   6. HLD  Current medicines are reviewed with the patient today.  The patient does not have concerns regarding medicines other than what has been noted above.  The following changes have been made:  See above.  Labs/ tests ordered today include:   No orders of the defined types were placed in this encounter.     Disposition:   FU with me in 2 weeks; he has follow up with Dr. Mare Ferrari and Dr. Caryl Comes.   Patient is agreeable to this plan and will call if any problems develop in the interim.   Signed: Burtis Junes, RN, ANP-C 10/24/2014 9:47 AM  [facility] [address] [place], [place]  [address] Phone: [phone] Fax: [phone]

## 2014-10-24 NOTE — Progress Notes (Signed)
Wound check appointment. Steri-strips removed. Wound without redness or edema. Incision edges approximated, wound well healed. Normal device function. Thresholds, sensing, and impedances consistent with implant measurements. Device programmed at 3.5V for extra safety margin until 3 month visit. Histogram distribution appropriate for patient and level of activity. No mode switches or ventricular arrhythmias noted. Patient educated about wound care, arm mobility, lifting restrictions, shock plan. ROV in 3 months with SK. °

## 2014-10-24 NOTE — Patient Instructions (Signed)
Stay on your current medicines  I want to increase the Coreg to 18.75 mg twice a day - take 1 1/2 of the 12.5 mg tablets twice a day - I have sent this to your pharmacy  See me in 2 weeks  Your device will be checked today  Monitor BP at home - keep a diary and bring your cuff back at your next visit for [place] to check  Call the [place] office at [phone] if you have any questions, problems or concerns.

## 2014-10-25 ENCOUNTER — Ambulatory Visit: Payer: Private Health Insurance - Indemnity

## 2014-10-26 MED ORDER — OMALIZUMAB 150 MG ~~LOC~~ SOLR
300.0000 mg | Freq: Once | SUBCUTANEOUS | Status: AC
  Administered 2014-10-24: 300 mg via SUBCUTANEOUS

## 2014-11-03 DIAGNOSIS — M1712 Unilateral primary osteoarthritis, left knee: Secondary | ICD-10-CM | POA: Diagnosis not present

## 2014-11-03 DIAGNOSIS — M25562 Pain in left knee: Secondary | ICD-10-CM | POA: Diagnosis not present

## 2014-11-11 ENCOUNTER — Other Ambulatory Visit: Payer: Self-pay | Admitting: Cardiology

## 2014-11-14 ENCOUNTER — Ambulatory Visit (INDEPENDENT_AMBULATORY_CARE_PROVIDER_SITE_OTHER): Payer: Medicare Other

## 2014-11-14 DIAGNOSIS — J4540 Moderate persistent asthma, uncomplicated: Secondary | ICD-10-CM | POA: Diagnosis not present

## 2014-11-14 DIAGNOSIS — E291 Testicular hypofunction: Secondary | ICD-10-CM | POA: Diagnosis not present

## 2014-11-14 MED ORDER — OMALIZUMAB 150 MG ~~LOC~~ SOLR
300.0000 mg | Freq: Once | SUBCUTANEOUS | Status: AC
  Administered 2014-11-14: 300 mg via SUBCUTANEOUS

## 2014-11-15 ENCOUNTER — Ambulatory Visit: Payer: Medicare Other | Admitting: Cardiology

## 2014-11-28 ENCOUNTER — Ambulatory Visit (INDEPENDENT_AMBULATORY_CARE_PROVIDER_SITE_OTHER): Payer: Medicare Other

## 2014-11-28 ENCOUNTER — Ambulatory Visit: Payer: Medicare Other

## 2014-11-28 DIAGNOSIS — J4540 Moderate persistent asthma, uncomplicated: Secondary | ICD-10-CM

## 2014-11-29 MED ORDER — OMALIZUMAB 150 MG ~~LOC~~ SOLR
300.0000 mg | Freq: Once | SUBCUTANEOUS | Status: AC
  Administered 2014-11-28: 300 mg via SUBCUTANEOUS

## 2014-12-01 ENCOUNTER — Ambulatory Visit: Payer: Medicare Other

## 2014-12-05 DIAGNOSIS — I2510 Atherosclerotic heart disease of native coronary artery without angina pectoris: Secondary | ICD-10-CM | POA: Diagnosis not present

## 2014-12-05 DIAGNOSIS — Z7982 Long term (current) use of aspirin: Secondary | ICD-10-CM | POA: Diagnosis not present

## 2014-12-05 DIAGNOSIS — E291 Testicular hypofunction: Secondary | ICD-10-CM | POA: Diagnosis not present

## 2014-12-05 DIAGNOSIS — Z87891 Personal history of nicotine dependence: Secondary | ICD-10-CM | POA: Diagnosis not present

## 2014-12-05 DIAGNOSIS — N400 Enlarged prostate without lower urinary tract symptoms: Secondary | ICD-10-CM | POA: Diagnosis not present

## 2014-12-12 ENCOUNTER — Ambulatory Visit: Payer: Medicare Other

## 2014-12-12 ENCOUNTER — Telehealth: Payer: Self-pay | Admitting: Critical Care Medicine

## 2014-12-14 ENCOUNTER — Ambulatory Visit (INDEPENDENT_AMBULATORY_CARE_PROVIDER_SITE_OTHER): Payer: Medicare Other

## 2014-12-14 DIAGNOSIS — J4540 Moderate persistent asthma, uncomplicated: Secondary | ICD-10-CM

## 2014-12-14 NOTE — Telephone Encounter (Signed)
#   vials:4 Ordered date:12-12-14 Shipping Date:12-13-14    # Vials:4 Arrival Date:12/13/14  Lot [id number]  Exp Date:12/19

## 2014-12-15 ENCOUNTER — Ambulatory Visit (INDEPENDENT_AMBULATORY_CARE_PROVIDER_SITE_OTHER): Payer: Medicare Other | Admitting: Cardiology

## 2014-12-15 ENCOUNTER — Encounter: Payer: Self-pay | Admitting: Cardiology

## 2014-12-15 VITALS — BP 144/82 | HR 60 | Ht 70.0 in | Wt 219.8 lb

## 2014-12-15 DIAGNOSIS — E785 Hyperlipidemia, unspecified: Secondary | ICD-10-CM

## 2014-12-15 DIAGNOSIS — I472 Ventricular tachycardia, unspecified: Secondary | ICD-10-CM

## 2014-12-15 DIAGNOSIS — Z95810 Presence of automatic (implantable) cardiac defibrillator: Secondary | ICD-10-CM | POA: Diagnosis not present

## 2014-12-15 DIAGNOSIS — I255 Ischemic cardiomyopathy: Secondary | ICD-10-CM

## 2014-12-15 DIAGNOSIS — I119 Hypertensive heart disease without heart failure: Secondary | ICD-10-CM

## 2014-12-15 NOTE — Patient Instructions (Signed)
Medication Instructions:  Your physician recommends that you continue on your current medications as directed. Please refer to the Current Medication list given to you today.  Labwork: NONE  Testing/Procedures: NONE  Follow-Up: Your physician recommends that you schedule a follow-up appointment in: 4 months with fasting labs (lp/bmet/hfp/CBC)   Any Other Special Instructions Will Be Listed Below (If Applicable). CALL DR Ernesto Rutherford [phone]) FOR APPOINTMENT, IF YOU HAVE ANY PROBLEMS CALL

## 2014-12-15 NOTE — Progress Notes (Signed)
Cardiology Office Note   Date:  12/15/2014   ID:  CARBON TREADWAY, DOB 03-29-1934, MRN [id number]  PCP:  Warren Danes, MD  Cardiologist: Darlin Coco MD  No chief complaint on file.     History of Present Illness: Shane [name] is a 81 y.o. male who presents for scheduled follow-up office visit  He has an ischemic CM, CAD with prior CABG, HLD, HTN, DM, CK and OA.  Presented with VT in the setting of LBBB and syncope - had CRTD implanted.  Dr. Caryl Comes is his electrophysiologist. Myoview demonstrated scar but no ischemia. He was advised about no driving for 6 months. ACE and BB continued but he was switched from Toprol to Coreg.  He has not been tolerating 25 mg carvedilol twice a day but is tolerating 18.75 mg twice a day He has not been having any increased shortness of breath.  He has not been aware of any racing of his heart.  He has had no further syncopal episodes.  He knows about not driving for 6 months following the ICD implant which was implanted on 10/10/14.  He has a Medtronic ICD with CRT.   Past Medical History  Diagnosis Date  . Coronary artery disease     remote CABG in 1985, cath in 2003 by Dr. Lia Foyer with no PCI, last nuclear in 2010 showing scar and EF of 28%.   . Dyslipidemia   . Hypercholesterolemia   . Left ventricular dysfunction     28% per nuclear in 2010 and 35 to 40% per echo in 2010  . HTN (hypertension)   . Diabetes mellitus   . Cellulitis of arm, left     MSSA  . Spinal stenosis   . BPH (benign prostatic hyperplasia)   . Allergic rhinitis 01-08-13    Uses nebulizer for chronic sinus issues and Mucinex.  . Abnormal nuclear cardiac imaging test Nov 2010    moderate area of infarct in the inferior wall with only minimal reversibility and EF of 28%  . Myocardial infarction     1985  . Pneumonia     hx of several times years ago   . Blood transfusion     ? at time of bypass surgery   . Arthritis     osteoarthritis   . Colon polyps    adenomatous  . Neuromuscular disorder     legs mild paralysis-able to walk"nerve damage"-legs- left leg brace   . CKD (chronic kidney disease), stage III     "was told due to meds he takes"-no Renal workup done  . Glaucoma 01-08-13    tx. eye drops    Past Surgical History  Procedure Laterality Date  . Cardiac catheterization  2003  . Lumbar disc surgery    . Prostate surgery    . Back surgery      hx of back surgery x 4   . Total knee arthroplasty  08/01/2011    Procedure: TOTAL KNEE ARTHROPLASTY;  Surgeon: Johnn Hai;  Location: [facility];  Service: Orthopedics;  Laterality: Right;  . Cataract       recent cataract surgery 6'14  . Coronary artery bypass graft  1985    x 5 vessels  . Colonoscopy N/A 01/25/2013    Procedure: COLONOSCOPY;  Surgeon: Irene Shipper, MD;  Location: [facility];  Service: Endoscopy;  Laterality: N/A;  . Bi-ventricular implantable cardioverter defibrillator N/A 10/10/2014    Procedure: BI-VENTRICULAR IMPLANTABLE CARDIOVERTER DEFIBRILLATOR  (CRT-D);  Surgeon: [name], MD;  Location: [facility] CATH LAB;  Service: Cardiovascular;  Laterality: N/A;     Current Outpatient Prescriptions  Medication Sig Dispense Refill  . acetaminophen (TYLENOL) 500 MG tablet Take 1,000 mg by mouth as needed for pain.     Marland Kitchen amLODipine (NORVASC) 10 MG tablet Take 10 mg by mouth daily.    Marland Kitchen aspirin EC 81 MG tablet Take 81 mg by mouth every morning.    Marland Kitchen atorvastatin (LIPITOR) 10 MG tablet Take 10 mg by mouth daily.    Marland Kitchen b complex vitamins tablet Take 1 tablet by mouth daily.    . carvedilol (COREG) 12.5 MG tablet Take 1.5 tablets (18.75 mg total) by mouth 2 (two) times daily. 270 tablet 3  . Cholecalciferol (VITAMIN D-3) 1000 UNITS CAPS Take 2,000 Units by mouth daily.    Marland Kitchen CIALIS 20 MG tablet Take 20 mg by mouth daily as needed for erectile dysfunction.     Marland Kitchen dextromethorphan-guaiFENesin (MUCINEX DM) 30-600 MG per 12 hr tablet Take 1 tablet by mouth 2 (two) times daily as needed  for cough.     . diphenhydrAMINE (BENADRYL) 25 mg capsule Take 25 mg by mouth every 6 (six) hours as needed for allergies.     Marland Kitchen EPINEPHrine (EPIPEN) 0.3 mg/0.3 mL SOAJ injection Inject 0.3 mLs (0.3 mg total) into the muscle once. 2 Device 0  . latanoprost (XALATAN) 0.005 % ophthalmic solution Place 1 drop into both eyes at bedtime.     . metFORMIN (GLUCOPHAGE) 500 MG tablet TAKE ONE TABLET BY MOUTH ONCE DAILY 90 tablet 3  . Multiple Vitamin (MULTIVITAMIN) tablet Take 1 tablet by mouth daily.    . naproxen sodium (ANAPROX) 220 MG tablet Take 220 mg by mouth daily as needed (back pain).    . NASONEX 50 MCG/ACT nasal spray USE 2 SPRAYS IN THE NOSE DAILY 1 g 2  . niacin (NIASPAN) 1000 MG CR tablet Take 1,000 mg by mouth at bedtime.    Marland Kitchen omalizumab (XOLAIR) 150 MG injection Inject 150 mg into the skin every 14 (fourteen) days.    Marland Kitchen spironolactone (ALDACTONE) 25 MG tablet Take 1 tablet (25 mg total) by mouth every other day. 45 tablet 3  . testosterone cypionate (DEPOTESTOTERONE CYPIONATE) 100 MG/ML injection Inject 100 mg into the muscle every 21 ( twenty-one) days. For IM use only    . valsartan-hydrochlorothiazide (DIOVAN-HCT) 320-12.5 MG per tablet Take 1 tablet by mouth daily.    Marland Kitchen zolpidem (AMBIEN) 10 MG tablet TAKE ONE-HALF TO ONE TABLET BY MOUTH AT BEDTIME AS NEEDED FOR SLEEP 30 tablet 5   No current facility-administered medications for this visit.    Allergies:   Codeine; Hydrocodone; and Azithromycin    Social History:  The patient  reports that he quit smoking about 56 years ago. His smoking use included Cigarettes, Pipe, and Cigars. He has a 5 pack-year smoking history. He has never used smokeless tobacco. He reports that he does not drink alcohol or use illicit drugs.   Family History:  The patient's family history includes Bone cancer in his brother; Colon polyps in his father; Diabetes in his sister; Heart attack in his father; Heart disease in his brother and father; Lung cancer in  his mother.    ROS:  Please see the history of present illness.   Otherwise, review of systems are positive for none.   All other systems are reviewed and negative.    PHYSICAL EXAM: VS:  BP 144/82 mmHg  Pulse 60  Ht 5\' 10"  (1.778 m)  Wt 219 lb 12.8 oz (99.701 kg)  BMI 31.54 kg/m2 , BMI Body mass index is 31.54 kg/(m^2). GEN: Well nourished, well developed, in no acute distress HEENT: normal Neck: no JVD, carotid bruits, or masses Cardiac: RRR; no murmurs, rubs, or gallops,no edema  Respiratory:  clear to auscultation bilaterally, normal work of breathing GI: soft, nontender, nondistended, + BS MS: no deformity or atrophy Skin: warm and dry, no rash Neuro:  Strength and sensation are intact Psych: euthymic mood, full affect   EKG:  EKG is ordered today. The ekg ordered today demonstrates atrial sensed ventricular paced rhythm.   Recent Labs: 07/26/2014: ALT 24 10/07/2014: Hemoglobin 12.7*; Magnesium 2.1; Platelets 144* 10/10/2014: BUN 32*; Creatinine 1.77*; Potassium 3.8; Sodium 137    Lipid Panel    Component Value Date/Time   CHOL 105 07/26/2014 0856   TRIG 102.0 07/26/2014 0856   HDL 24.10* 07/26/2014 0856   CHOLHDL 4 07/26/2014 0856   VLDL 20.4 07/26/2014 0856   LDLCALC 61 07/26/2014 0856      Wt Readings from Last 3 Encounters:  12/15/14 219 lb 12.8 oz (99.701 kg)  10/24/14 216 lb 9.6 oz (98.249 kg)  10/11/14 207 lb 0.2 oz (93.9 kg)        ASSESSMENT AND PLAN:  1. Syncope - noted to have had VT with LBBB - now s/p CRTD implant - no recurrence -   2. Ischemic CM - scar on Myoview but no ischemia. No chest pain.  3. Systolic HF - looks compensated  4. CAD - no active chest pain  5. HTN -  6. HLD   Current medicines are reviewed at length with the patient today.  The patient does not have concerns regarding medicines.  The following changes have been made:  no change  Labs/ tests ordered today include:   Orders Placed This Encounter    Procedures  . Lipid panel  . Hepatic function panel  . Basic metabolic panel  . CBC with Differential/Platelet     Disposition: He is to continue on current medication.  Recheck in 4 months.  Get CBC lipid panel hepatic function panel and basal metabolic panel. He has also been having some decreased hearing.  Today he borrowed one of his wife's hearing aids and is able to hear better.  He will call Dr. Ernesto Rutherford to get evaluated for possible hearing aids.  Berna Spare MD 12/15/2014 5:49 PM    [facility] [place], [place], [place]  [address] Phone: [phone]; Fax: [phone]

## 2014-12-16 MED ORDER — OMALIZUMAB 150 MG ~~LOC~~ SOLR
300.0000 mg | Freq: Once | SUBCUTANEOUS | Status: AC
  Administered 2014-12-14: 300 mg via SUBCUTANEOUS

## 2014-12-18 ENCOUNTER — Other Ambulatory Visit: Payer: Self-pay | Admitting: Cardiology

## 2014-12-20 ENCOUNTER — Other Ambulatory Visit: Payer: Self-pay | Admitting: Cardiology

## 2014-12-26 ENCOUNTER — Ambulatory Visit: Payer: Medicare Other

## 2014-12-28 ENCOUNTER — Ambulatory Visit: Payer: Medicare Other

## 2014-12-28 ENCOUNTER — Ambulatory Visit (INDEPENDENT_AMBULATORY_CARE_PROVIDER_SITE_OTHER): Payer: Medicare Other

## 2014-12-28 DIAGNOSIS — J4540 Moderate persistent asthma, uncomplicated: Secondary | ICD-10-CM

## 2014-12-28 DIAGNOSIS — E291 Testicular hypofunction: Secondary | ICD-10-CM | POA: Diagnosis not present

## 2015-01-03 MED ORDER — OMALIZUMAB 150 MG ~~LOC~~ SOLR
300.0000 mg | Freq: Once | SUBCUTANEOUS | Status: AC
  Administered 2014-12-28: 300 mg via SUBCUTANEOUS

## 2015-01-06 ENCOUNTER — Telehealth: Payer: Self-pay | Admitting: Critical Care Medicine

## 2015-01-06 NOTE — Telephone Encounter (Signed)
#   vials:4 Ordered date:01/06/15 Shipping Date:01/06/15

## 2015-01-10 NOTE — Telephone Encounter (Signed)
#   Vials:4 Arrival Date:01/10/15 Lot [id number]  Exp Date:1/20

## 2015-01-11 ENCOUNTER — Ambulatory Visit (INDEPENDENT_AMBULATORY_CARE_PROVIDER_SITE_OTHER): Payer: Medicare Other

## 2015-01-11 DIAGNOSIS — J4520 Mild intermittent asthma, uncomplicated: Secondary | ICD-10-CM

## 2015-01-12 MED ORDER — OMALIZUMAB 150 MG ~~LOC~~ SOLR
300.0000 mg | Freq: Once | SUBCUTANEOUS | Status: AC
  Administered 2015-01-11: 300 mg via SUBCUTANEOUS

## 2015-01-17 ENCOUNTER — Encounter: Payer: Medicare Other | Admitting: Internal Medicine

## 2015-01-25 ENCOUNTER — Ambulatory Visit (INDEPENDENT_AMBULATORY_CARE_PROVIDER_SITE_OTHER): Payer: Medicare Other

## 2015-01-25 DIAGNOSIS — J4520 Mild intermittent asthma, uncomplicated: Secondary | ICD-10-CM | POA: Diagnosis not present

## 2015-01-25 DIAGNOSIS — E291 Testicular hypofunction: Secondary | ICD-10-CM | POA: Diagnosis not present

## 2015-01-26 MED ORDER — OMALIZUMAB 150 MG ~~LOC~~ SOLR
300.0000 mg | Freq: Once | SUBCUTANEOUS | Status: AC
  Administered 2015-01-25: 300 mg via SUBCUTANEOUS

## 2015-01-30 ENCOUNTER — Telehealth: Payer: Self-pay | Admitting: Critical Care Medicine

## 2015-01-30 ENCOUNTER — Other Ambulatory Visit: Payer: Self-pay | Admitting: Cardiology

## 2015-01-30 DIAGNOSIS — G4700 Insomnia, unspecified: Secondary | ICD-10-CM

## 2015-01-30 NOTE — Telephone Encounter (Signed)
#   vials:4 Ordered date:01/30/15 Shipping Date:01/31/15

## 2015-01-31 NOTE — Telephone Encounter (Signed)
#   Vials:4 Arrival Date:01/31/15 Lot [id number] Exp Date:1/20

## 2015-02-03 ENCOUNTER — Encounter: Payer: Self-pay | Admitting: Internal Medicine

## 2015-02-03 ENCOUNTER — Ambulatory Visit (INDEPENDENT_AMBULATORY_CARE_PROVIDER_SITE_OTHER): Payer: Medicare Other | Admitting: Internal Medicine

## 2015-02-03 VITALS — BP 134/60 | HR 68 | Ht 71.0 in | Wt 218.6 lb

## 2015-02-03 DIAGNOSIS — I472 Ventricular tachycardia, unspecified: Secondary | ICD-10-CM

## 2015-02-03 DIAGNOSIS — E785 Hyperlipidemia, unspecified: Secondary | ICD-10-CM

## 2015-02-03 DIAGNOSIS — I255 Ischemic cardiomyopathy: Secondary | ICD-10-CM | POA: Diagnosis not present

## 2015-02-03 DIAGNOSIS — Z4502 Encounter for adjustment and management of automatic implantable cardiac defibrillator: Secondary | ICD-10-CM | POA: Diagnosis not present

## 2015-02-03 DIAGNOSIS — I447 Left bundle-branch block, unspecified: Secondary | ICD-10-CM | POA: Diagnosis not present

## 2015-02-03 DIAGNOSIS — I119 Hypertensive heart disease without heart failure: Secondary | ICD-10-CM

## 2015-02-03 LAB — CUP PACEART INCLINIC DEVICE CHECK
Brady Statistic AP VS Percent: 0.03 %
Brady Statistic AS VP Percent: 97.49 %
Brady Statistic RA Percent Paced: 0.67 %
Brady Statistic RV Percent Paced: 0.22 %
Date Time Interrogation Session: 20160701113124
HIGH POWER IMPEDANCE MEASURED VALUE: 56 Ohm
HighPow Impedance: 171 Ohm
Lead Channel Pacing Threshold Amplitude: 0.5 V
Lead Channel Pacing Threshold Amplitude: 0.5 V
Lead Channel Pacing Threshold Amplitude: 1.5 V
Lead Channel Pacing Threshold Amplitude: 2.25 V
Lead Channel Pacing Threshold Pulse Width: 0.4 ms
Lead Channel Pacing Threshold Pulse Width: 0.6 ms
Lead Channel Sensing Intrinsic Amplitude: 13.75 mV
Lead Channel Sensing Intrinsic Amplitude: 2.875 mV
Lead Channel Setting Pacing Amplitude: 2 V
Lead Channel Setting Pacing Amplitude: 2 V
Lead Channel Setting Pacing Amplitude: 3 V
Lead Channel Setting Sensing Sensitivity: 0.3 mV
MDC IDC MSMT BATTERY REMAINING LONGEVITY: 98 mo
MDC IDC MSMT BATTERY VOLTAGE: 3.03 V
MDC IDC MSMT LEADCHNL RA IMPEDANCE VALUE: 399 Ohm
MDC IDC MSMT LEADCHNL RA PACING THRESHOLD PULSEWIDTH: 0.4 ms
MDC IDC MSMT LEADCHNL RA SENSING INTR AMPL: 3.375 mV
MDC IDC MSMT LEADCHNL RV IMPEDANCE VALUE: 456 Ohm
MDC IDC MSMT LEADCHNL RV PACING THRESHOLD PULSEWIDTH: 1 ms
MDC IDC MSMT LEADCHNL RV SENSING INTR AMPL: 15.875 mV
MDC IDC SET LEADCHNL LV PACING PULSEWIDTH: 0.6 ms
MDC IDC SET LEADCHNL RV PACING PULSEWIDTH: 1 ms
MDC IDC STAT BRADY AP VP PERCENT: 0.65 %
MDC IDC STAT BRADY AS VS PERCENT: 1.83 %
Zone Setting Detection Interval: 300 ms
Zone Setting Detection Interval: 350 ms
Zone Setting Detection Interval: 400 ms
Zone Setting Detection Interval: 400 ms

## 2015-02-03 LAB — BASIC METABOLIC PANEL
BUN: 46 mg/dL — AB (ref 6–23)
CALCIUM: 9.2 mg/dL (ref 8.4–10.5)
CHLORIDE: 106 meq/L (ref 96–112)
CO2: 22 meq/L (ref 19–32)
Creatinine, Ser: 2.07 mg/dL — ABNORMAL HIGH (ref 0.40–1.50)
GFR: 33.05 mL/min — ABNORMAL LOW (ref >60.00)
Glucose, Bld: 141 mg/dL — ABNORMAL HIGH (ref 70–99)
Potassium: 4.6 mEq/L (ref 3.5–5.1)
Sodium: 137 mEq/L (ref 135–145)

## 2015-02-03 MED ORDER — SPIRONOLACTONE 25 MG PO TABS: 12.5000 mg | ORAL_TABLET | Freq: Every day | ORAL | Status: DC

## 2015-02-03 MED ORDER — METOPROLOL SUCCINATE ER 50 MG PO TB24: 50.0000 mg | ORAL_TABLET | Freq: Every day | ORAL | Status: DC

## 2015-02-03 NOTE — Progress Notes (Signed)
Patient Care Team: Darlin Coco, MD as PCP - General (Cardiology) Elsie Stain, MD as Consulting Physician (Pulmonary Disease)   HPI  Shane [name] is a 81 y.o. male Seen in follow-up for CRT-D implanted 3/16.  He has a history of ischemic heart disease with remote bypass surgery  He presented 3/16 with ventricular tachycardia    Echo >> EF 35-40%, Akinesis and scarring of the basal-midinferolateral, inferior, and inferoseptal myocardium. PA pressure 77mmHg. LA moderately dilated.  Myoview>> . No reversible ischemia. Large fixed inferior an inferoapical wall defect, consistent with myocardial infarction. Severe inferior wall hypokinesis and left ventricular dilatation. Left ventricular ejection fraction 22%   He underwent CRT-D implantation in the context of left bundle branch block  He was also noted in hospital to have frequent ventricular ectopy.  He has renal insufficiency date  Cr  3/16 1.97  3/16 1.77   He has noted if anything some worsening in energy level following CRT implantation. At that hospitalization carvedilol was changed for metoprolol.  He does snore. He has daytime somnolence. He has some peripheral edema.    Past Medical History  Diagnosis Date  . Coronary artery disease     remote CABG in 1985, cath in 2003 by Dr. Lia Foyer with no PCI, last nuclear in 2010 showing scar and EF of 28%.   . Dyslipidemia   . Hypercholesterolemia   . Left ventricular dysfunction     28% per nuclear in 2010 and 35 to 40% per echo in 2010  . HTN (hypertension)   . Diabetes mellitus   . Cellulitis of arm, left     MSSA  . Spinal stenosis   . BPH (benign prostatic hyperplasia)   . Allergic rhinitis 01-08-13    Uses nebulizer for chronic sinus issues and Mucinex.  . Abnormal nuclear cardiac imaging test Nov 2010    moderate area of infarct in the inferior wall with only minimal reversibility and EF of 28%  . Myocardial infarction     1985  . Pneumonia       hx of several times years ago   . Blood transfusion     ? at time of bypass surgery   . Arthritis     osteoarthritis   . Colon polyps     adenomatous  . Neuromuscular disorder     legs mild paralysis-able to walk"nerve damage"-legs- left leg brace   . CKD (chronic kidney disease), stage III     "was told due to meds he takes"-no Renal workup done  . Glaucoma 01-08-13    tx. eye drops    Past Surgical History  Procedure Laterality Date  . Cardiac catheterization  2003  . Lumbar disc surgery    . Prostate surgery    . Back surgery      hx of back surgery x 4   . Total knee arthroplasty  08/01/2011    Procedure: TOTAL KNEE ARTHROPLASTY;  Surgeon: Johnn Hai;  Location: [facility];  Service: Orthopedics;  Laterality: Right;  . Cataract       recent cataract surgery 6'14  . Coronary artery bypass graft  1985    x 5 vessels  . Colonoscopy N/A 01/25/2013    Procedure: COLONOSCOPY;  Surgeon: Irene Shipper, MD;  Location: [facility];  Service: Endoscopy;  Laterality: N/A;  . Bi-ventricular implantable cardioverter defibrillator N/A 10/10/2014    Procedure: BI-VENTRICULAR IMPLANTABLE CARDIOVERTER DEFIBRILLATOR  (CRT-D);  Surgeon: [name], MD;  Location: [facility];  Service: Cardiovascular;  Laterality: N/A;    Current Outpatient Prescriptions  Medication Sig Dispense Refill  . acetaminophen (TYLENOL) 500 MG tablet Take 1,000 mg by mouth as needed for pain.     Marland Kitchen amLODipine (NORVASC) 10 MG tablet Take 10 mg by mouth daily.    Marland Kitchen aspirin EC 81 MG tablet Take 81 mg by mouth every morning.    Marland Kitchen atorvastatin (LIPITOR) 10 MG tablet Take 10 mg by mouth daily.    Marland Kitchen b complex vitamins tablet Take 1 tablet by mouth daily.    . carvedilol (COREG) 12.5 MG tablet Take 1.5 tablets (18.75 mg total) by mouth 2 (two) times daily. 270 tablet 3  . Cholecalciferol (VITAMIN D-3) 1000 UNITS CAPS Take 2,000 Units by mouth daily.    Marland Kitchen CIALIS 20 MG tablet Take 20 mg by mouth daily as needed for  erectile dysfunction.     Marland Kitchen dextromethorphan-guaiFENesin (MUCINEX DM) 30-600 MG per 12 hr tablet Take 1 tablet by mouth 2 (two) times daily as needed for cough.     . diphenhydrAMINE (BENADRYL) 25 mg capsule Take 25 mg by mouth every 6 (six) hours as needed for allergies.     Marland Kitchen EPINEPHrine (EPIPEN) 0.3 mg/0.3 mL SOAJ injection Inject 0.3 mLs (0.3 mg total) into the muscle once. 2 Device 0  . latanoprost (XALATAN) 0.005 % ophthalmic solution Place 1 drop into both eyes at bedtime.     . metFORMIN (GLUCOPHAGE) 500 MG tablet TAKE ONE TABLET BY MOUTH ONCE DAILY 90 tablet 3  . Multiple Vitamin (MULTIVITAMIN) tablet Take 1 tablet by mouth daily.    . naproxen sodium (ANAPROX) 220 MG tablet Take 220 mg by mouth daily as needed (back pain).    . NASONEX 50 MCG/ACT nasal spray USE 2 SPRAYS IN THE NOSE DAILY 1 g 2  . niacin (NIASPAN) 1000 MG CR tablet Take 1,000 mg by mouth at bedtime.    Marland Kitchen omalizumab (XOLAIR) 150 MG injection Inject 150 mg into the skin every 14 (fourteen) days.    Marland Kitchen spironolactone (ALDACTONE) 25 MG tablet Take 1 tablet (25 mg total) by mouth every other day. 45 tablet 3  . testosterone cypionate (DEPOTESTOTERONE CYPIONATE) 100 MG/ML injection Inject 400 mg into the muscle every 21 ( twenty-one) days. For IM use only    . valsartan-hydrochlorothiazide (DIOVAN-HCT) 320-12.5 MG per tablet Take 1 tablet by mouth daily.    Marland Kitchen zolpidem (AMBIEN) 10 MG tablet TAKE ONE-HALF TO ONE TABLET BY MOUTH AT BEDTIME AS NEEDED FOR SLEEP 30 tablet 5   No current facility-administered medications for this visit.    Allergies  Allergen Reactions  . Codeine     anxiety  . Hydrocodone     anxiety  . Azithromycin Rash    Review of Systems negative except from HPI and PMH  Physical Exam BP 134/60 mmHg  Pulse 68  Ht 5\' 11"  (1.803 m)  Wt 218 lb 9.6 oz (99.156 kg)  BMI 30.50 kg/m2 Well developed and well nourished in no acute distress HENT normal E scleral and icterus clear Neck Supple JVP flat;  carotids brisk and full Device pocket well healed; without hematoma or erythema.  There is no tethering  Clear to ausculation regular rate and rhythm, no murmurs gallops or rub Soft with active bowel sounds No clubbing cyanosis 1+ ready Edema Alert and oriented, grossly normal motor and sensory function Skin Warm and Dry  ECG was ordered today demonstrated sinus rhythm at 69  with PCI and is pacing intervals 1 01/17/42 transition is in lead V3  Assessment and  Plan  Ischemic cardiomyopathy  Congestive heart failure-chronic-systolic  Left Bundle-branch block  CRT-D-Medtronic  Hyperlipidemia  Renal insufficiency   No postop ECG was done. Chest x-ray demonstrates good lead location. The distal tip would be the preferred pacing electrode. Currently pacing from LV 2. We have reprogrammed his device from LV 2-coil to LV 1-coil.  This was associated with a QRS shortening from 152-122 ms  Changes beta blocker back to metoprolol succinate but we will wait for a couple of weeks to do that so as to try to identify which change is associated with what outcomes. He is to see Dr. TB in a few weeks.  With his sleep disordered breathing and fatigue, I think a sleep study might be of benefit. I will defer this to his conversations with Dr. Lonzo Cloud.  We will recheck his renal function given his high-risk medication    Defer to Dr. TB as to whether his niacin should be discontinued given the recent recommendations

## 2015-02-03 NOTE — Patient Instructions (Addendum)
Medication Instructions:  Your physician has recommended you make the following change in your medication:  1) Decrease Spironolactone to 12.5 mg daily 2) STOP Coreg 3) START Toprol 50 mg daily  Labwork: Spironolactone surveillance lab work today: BMET  Testing/Procedures: None ordered  Follow-Up: Remote monitoring is used to monitor your ICD from home. This monitoring reduces the number of office visits required to check your device to one time per year. It allows [place] to keep an eye on the functioning of your device to ensure it is working properly. You are scheduled for a device check from home on 05/08/2015. You may send your transmission at any time that day. If you have a wireless device, the transmission will be sent automatically. After your physician reviews your transmission, you will receive a postcard with your next transmission date.  Your physician recommends that you schedule a follow-up appointment in: 9 months with Dr.Klein  Any Other Special Instructions Will Be Listed Below (If Applicable). Thank you for choosing [facility]!!

## 2015-02-08 ENCOUNTER — Ambulatory Visit (INDEPENDENT_AMBULATORY_CARE_PROVIDER_SITE_OTHER): Payer: Medicare Other

## 2015-02-08 ENCOUNTER — Telehealth: Payer: Self-pay | Admitting: Cardiology

## 2015-02-08 ENCOUNTER — Ambulatory Visit: Payer: Medicare Other

## 2015-02-08 DIAGNOSIS — J4540 Moderate persistent asthma, uncomplicated: Secondary | ICD-10-CM | POA: Diagnosis not present

## 2015-02-08 DIAGNOSIS — H4011X1 Primary open-angle glaucoma, mild stage: Secondary | ICD-10-CM | POA: Diagnosis not present

## 2015-02-08 MED ORDER — OMALIZUMAB 150 MG ~~LOC~~ SOLR
300.0000 mg | Freq: Once | SUBCUTANEOUS | Status: AC
  Administered 2015-02-08: 300 mg via SUBCUTANEOUS

## 2015-02-08 NOTE — Telephone Encounter (Signed)
Follow Up    Pt is calling returning call from earlier. Please call back.

## 2015-02-09 NOTE — Telephone Encounter (Signed)
Notes Recorded by Earvin Hansen on 02/08/2015 at 6:02 PM Advised patient of lab results Notes Recorded by Earvin Hansen on 02/08/2015 at 11:04 AM Left message to call back Notes Recorded by Darlin Coco, MD on 02/06/2015 at 11:30 AM BS 141 higher. Watch diet.  Creatinine is higher. Try to drink more water. CSD. Recheck BMET at next [place]

## 2015-02-20 DIAGNOSIS — E291 Testicular hypofunction: Secondary | ICD-10-CM | POA: Diagnosis not present

## 2015-02-22 ENCOUNTER — Ambulatory Visit: Payer: Medicare Other

## 2015-02-23 ENCOUNTER — Ambulatory Visit (INDEPENDENT_AMBULATORY_CARE_PROVIDER_SITE_OTHER): Payer: Medicare Other

## 2015-02-23 DIAGNOSIS — J4540 Moderate persistent asthma, uncomplicated: Secondary | ICD-10-CM

## 2015-02-27 MED ORDER — OMALIZUMAB 150 MG ~~LOC~~ SOLR
300.0000 mg | Freq: Once | SUBCUTANEOUS | Status: AC
  Administered 2015-02-23: 300 mg via SUBCUTANEOUS

## 2015-02-28 ENCOUNTER — Telehealth: Payer: Self-pay | Admitting: Critical Care Medicine

## 2015-02-28 NOTE — Telephone Encounter (Signed)
#   vials:4 Ordered date:02/28/15 Shipping Date:03/01/15

## 2015-03-01 NOTE — Telephone Encounter (Signed)
#   Vials:4 Arrival Date:03/01/15 Lot [id number] Exp Date:2/20

## 2015-03-09 ENCOUNTER — Ambulatory Visit (INDEPENDENT_AMBULATORY_CARE_PROVIDER_SITE_OTHER): Payer: Medicare Other

## 2015-03-09 ENCOUNTER — Ambulatory Visit: Payer: Medicare Other

## 2015-03-09 DIAGNOSIS — J4540 Moderate persistent asthma, uncomplicated: Secondary | ICD-10-CM | POA: Diagnosis not present

## 2015-03-09 MED ORDER — OMALIZUMAB 150 MG ~~LOC~~ SOLR
300.0000 mg | Freq: Once | SUBCUTANEOUS | Status: AC
  Administered 2015-03-09: 300 mg via SUBCUTANEOUS

## 2015-03-13 ENCOUNTER — Other Ambulatory Visit: Payer: Self-pay | Admitting: Cardiology

## 2015-03-13 DIAGNOSIS — E291 Testicular hypofunction: Secondary | ICD-10-CM | POA: Diagnosis not present

## 2015-03-14 NOTE — Telephone Encounter (Signed)
Niaspan 1000 mg one daily

## 2015-03-14 NOTE — Telephone Encounter (Signed)
Could you please clarify what patients current niaspan therapy should be? Thanks, MI

## 2015-03-16 ENCOUNTER — Other Ambulatory Visit: Payer: Self-pay | Admitting: Cardiology

## 2015-03-16 DIAGNOSIS — E0840 Diabetes mellitus due to underlying condition with diabetic neuropathy, unspecified: Secondary | ICD-10-CM

## 2015-03-23 ENCOUNTER — Ambulatory Visit (INDEPENDENT_AMBULATORY_CARE_PROVIDER_SITE_OTHER): Payer: Medicare Other

## 2015-03-23 DIAGNOSIS — J4520 Mild intermittent asthma, uncomplicated: Secondary | ICD-10-CM | POA: Diagnosis not present

## 2015-03-23 MED ORDER — OMALIZUMAB 150 MG ~~LOC~~ SOLR
300.0000 mg | Freq: Once | SUBCUTANEOUS | Status: AC
  Administered 2015-03-23: 300 mg via SUBCUTANEOUS

## 2015-04-03 ENCOUNTER — Telehealth: Payer: Self-pay | Admitting: Critical Care Medicine

## 2015-04-03 DIAGNOSIS — E291 Testicular hypofunction: Secondary | ICD-10-CM | POA: Diagnosis not present

## 2015-04-03 NOTE — Telephone Encounter (Addendum)
#   vials:4 Ordered date:04/04/15  Shipping Date:04/05/15

## 2015-04-04 ENCOUNTER — Telehealth: Payer: Self-pay

## 2015-04-04 MED ORDER — CARVEDILOL 12.5 MG PO TABS: 18.7500 mg | ORAL_TABLET | Freq: Two times a day (BID) | ORAL | Status: DC

## 2015-04-04 NOTE — Telephone Encounter (Signed)
Pt called and left voicemail requesting Carvedilol refill. It was stopped at his last visit with Dr. Caryl Comes.      [name], MD at 02/03/2015 9:02 AM   Medication Instructions:  Your physician has recommended you make the following change in your medication:  1) Decrease Spironolactone to 12.5 mg daily 2) STOP Coreg 3) START Toprol 50 mg daily

## 2015-04-04 NOTE — Telephone Encounter (Signed)
Spoke with patient who never stopped Carvedilol. He does not want to switch at this time.  Dr. Olin Pia note stated: Changes beta blocker back to metoprolol succinate but we will wait for a couple of weeks to do that so as to try to identify which change is associated with what outcomes.  Pt requesting 90d supply to be sent to [facility]. Advised patient this would be taken care of today. Patient verbalized understanding and thanks me for helping.

## 2015-04-05 NOTE — Telephone Encounter (Signed)
#   Vials:4 Arrival Date:04/05/15 Lot [id number] Exp Date:4/20

## 2015-04-06 ENCOUNTER — Ambulatory Visit (INDEPENDENT_AMBULATORY_CARE_PROVIDER_SITE_OTHER): Payer: Medicare Other

## 2015-04-06 DIAGNOSIS — J4540 Moderate persistent asthma, uncomplicated: Secondary | ICD-10-CM | POA: Diagnosis not present

## 2015-04-06 MED ORDER — OMALIZUMAB 150 MG ~~LOC~~ SOLR
300.0000 mg | Freq: Once | SUBCUTANEOUS | Status: AC
  Administered 2015-04-06: 300 mg via SUBCUTANEOUS

## 2015-04-12 ENCOUNTER — Ambulatory Visit (INDEPENDENT_AMBULATORY_CARE_PROVIDER_SITE_OTHER): Payer: Medicare Other | Admitting: Cardiology

## 2015-04-12 ENCOUNTER — Encounter: Payer: Self-pay | Admitting: Cardiology

## 2015-04-12 VITALS — BP 112/68 | HR 68 | Ht 70.0 in | Wt 220.4 lb

## 2015-04-12 DIAGNOSIS — E785 Hyperlipidemia, unspecified: Secondary | ICD-10-CM | POA: Diagnosis not present

## 2015-04-12 DIAGNOSIS — Z95810 Presence of automatic (implantable) cardiac defibrillator: Secondary | ICD-10-CM | POA: Diagnosis not present

## 2015-04-12 DIAGNOSIS — I119 Hypertensive heart disease without heart failure: Secondary | ICD-10-CM | POA: Diagnosis not present

## 2015-04-12 DIAGNOSIS — I255 Ischemic cardiomyopathy: Secondary | ICD-10-CM | POA: Diagnosis not present

## 2015-04-12 NOTE — Progress Notes (Signed)
Cardiology Office Note   Date:  04/12/2015   ID:  Shane [name], DOB 01/21/1934, MRN [id number]  PCP:  Warren Danes, MD  Cardiologist: Darlin Coco MD  No chief complaint on file.     History of Present Illness:  Shane [name] is a 81 y.o. male who presents for scheduled follow-up office visit.  He has an ischemic CM, CAD with prior CABG, HLD, HTN, DM, CK and OA.  Presented with VT in the setting of LBBB and syncope - had CRTD implanted. Dr. Caryl Comes is his electrophysiologist. Myoview demonstrated scar but no ischemia. He was advised about no driving for 6 months.  His 6 months is up today and he is starting to drive. ACE and BB continued but he was switched from Toprol to Coreg. He has not been tolerating 25 mg carvedilol twice a day but is tolerating 18.75 mg twice a day He has not been having any increased shortness of breath. He has not been aware of any racing of his heart. He has had no further syncopal episodes. He knows about not driving for 6 months following the ICD implant which was implanted on 10/10/14. He has a Medtronic ICD with CRT. He has not been having any chest pain or shortness of breath.  No dizziness.  He has had some sinus congestion for which he takes Mucinex.  He has had some peripheral edema from his amlodipine.  He is no longer taking Niaspan.  He will be getting some nutritional counseling from a lady who goes to his church.  Past Medical History  Diagnosis Date  . Coronary artery disease     remote CABG in 1985, cath in 2003 by Dr. Lia Foyer with no PCI, last nuclear in 2010 showing scar and EF of 28%.   . Dyslipidemia   . Hypercholesterolemia   . Left ventricular dysfunction     28% per nuclear in 2010 and 35 to 40% per echo in 2010  . HTN (hypertension)   . Diabetes mellitus   . Cellulitis of arm, left     MSSA  . Spinal stenosis   . BPH (benign prostatic hyperplasia)   . Allergic rhinitis 01-08-13    Uses nebulizer for chronic sinus  issues and Mucinex.  . Abnormal nuclear cardiac imaging test Nov 2010    moderate area of infarct in the inferior wall with only minimal reversibility and EF of 28%  . Myocardial infarction     1985  . Pneumonia     hx of several times years ago   . Blood transfusion     ? at time of bypass surgery   . Arthritis     osteoarthritis   . Colon polyps     adenomatous  . Neuromuscular disorder     legs mild paralysis-able to walk"nerve damage"-legs- left leg brace   . CKD (chronic kidney disease), stage III     "was told due to meds he takes"-no Renal workup done  . Glaucoma 01-08-13    tx. eye drops    Past Surgical History  Procedure Laterality Date  . Cardiac catheterization  2003  . Lumbar disc surgery    . Prostate surgery    . Back surgery      hx of back surgery x 4   . Total knee arthroplasty  08/01/2011    Procedure: TOTAL KNEE ARTHROPLASTY;  Surgeon: Johnn Hai;  Location: [facility];  Service: Orthopedics;  Laterality: Right;  . Cataract  recent cataract surgery 6'14  . Coronary artery bypass graft  1985    x 5 vessels  . Colonoscopy N/A 01/25/2013    Procedure: COLONOSCOPY;  Surgeon: Irene Shipper, MD;  Location: [facility];  Service: Endoscopy;  Laterality: N/A;  . Bi-ventricular implantable cardioverter defibrillator N/A 10/10/2014    Procedure: BI-VENTRICULAR IMPLANTABLE CARDIOVERTER DEFIBRILLATOR  (CRT-D);  Surgeon: [name], MD;  Location: [facility];  Service: Cardiovascular;  Laterality: N/A;     Current Outpatient Prescriptions  Medication Sig Dispense Refill  . acetaminophen (TYLENOL) 500 MG tablet Take 1,000 mg by mouth as needed for pain.     Marland Kitchen amLODipine (NORVASC) 10 MG tablet Take 10 mg by mouth daily.    Marland Kitchen aspirin EC 81 MG tablet Take 81 mg by mouth every morning.    Marland Kitchen atorvastatin (LIPITOR) 10 MG tablet Take 10 mg by mouth daily.    Marland Kitchen b complex vitamins tablet Take 1 tablet by mouth daily.    . carvedilol (COREG) 12.5 MG tablet Take 1.5  tablets (18.75 mg total) by mouth 2 (two) times daily. 270 tablet 3  . Cholecalciferol (VITAMIN D-3) 1000 UNITS CAPS Take 2,000 Units by mouth daily.    Marland Kitchen CIALIS 20 MG tablet Take 20 mg by mouth daily as needed for erectile dysfunction.     Marland Kitchen dextromethorphan-guaiFENesin (MUCINEX DM) 30-600 MG per 12 hr tablet Take 1 tablet by mouth 2 (two) times daily as needed for cough.     . diphenhydrAMINE (BENADRYL) 25 mg capsule Take 25 mg by mouth every 6 (six) hours as needed for allergies.     Marland Kitchen EPINEPHrine (EPIPEN) 0.3 mg/0.3 mL SOAJ injection Inject 0.3 mLs (0.3 mg total) into the muscle once. 2 Device 0  . fluocinonide cream (LIDEX) [id number] % Apply 1 application topically 2 (two) times daily.    Marland Kitchen latanoprost (XALATAN) 0.005 % ophthalmic solution Place 1 drop into both eyes at bedtime.     . metFORMIN (GLUCOPHAGE) 500 MG tablet TAKE ONE TABLET BY MOUTH ONCE DAILY 90 tablet 0  . Multiple Vitamin (MULTIVITAMIN) tablet Take 1 tablet by mouth daily.    . naproxen sodium (ANAPROX) 220 MG tablet Take 220 mg by mouth daily as needed (back pain).    Marland Kitchen NIASPAN 1000 MG CR tablet Take 1 tablet (1,000 mg total) by mouth daily. 90 tablet 0  . omalizumab (XOLAIR) 150 MG injection Inject 150 mg into the skin every 14 (fourteen) days.    Marland Kitchen spironolactone (ALDACTONE) 25 MG tablet Take 0.5 tablets (12.5 mg total) by mouth daily. 15 tablet 11  . testosterone cypionate (DEPOTESTOTERONE CYPIONATE) 100 MG/ML injection Inject 400 mg into the muscle every 21 ( twenty-one) days. For IM use only    . triamcinolone (NASACORT AQ) 55 MCG/ACT AERO nasal inhaler Place 2 sprays into the nose daily.    . valsartan-hydrochlorothiazide (DIOVAN-HCT) 320-12.5 MG per tablet Take 1 tablet by mouth daily.    Marland Kitchen zolpidem (AMBIEN) 10 MG tablet TAKE ONE-HALF TO ONE TABLET BY MOUTH AT BEDTIME AS NEEDED FOR SLEEP 30 tablet 5   No current facility-administered medications for this visit.    Allergies:   Codeine; Hydrocodone; and Azithromycin     Social History:  The patient  reports that he quit smoking about 56 years ago. His smoking use included Cigarettes, Pipe, and Cigars. He has a 5 pack-year smoking history. He has never used smokeless tobacco. He reports that he does not drink alcohol or use illicit drugs.  Family History:  The patient's family history includes Bone cancer in his brother; Colon polyps in his father; Diabetes in his sister; Heart attack in his father; Heart disease in his brother and father; Lung cancer in his mother.    ROS:  Please see the history of present illness.   Otherwise, review of systems are positive for none.   All other systems are reviewed and negative.    PHYSICAL EXAM: VS:  BP 112/68 mmHg  Pulse 68  Ht 5\' 10"  (1.778 m)  Wt 220 lb 6.4 oz (99.973 kg)  BMI 31.62 kg/m2 , BMI Body mass index is 31.62 kg/(m^2). GEN: Well nourished, well developed, in no acute distress HEENT: normal Neck: no JVD, carotid bruits, or masses Cardiac: RRR; no murmurs, rubs, or gallops,no edema  Respiratory:  clear to auscultation bilaterally, normal work of breathing GI: soft, nontender, nondistended, + BS MS: no deformity or atrophy.  There is foot drop of the left foot and his foot is in a brace Skin: warm and dry, no rash Neuro:  Strength and sensation are intact Psych: euthymic mood, full affect   EKG:  EKG is not ordered today.    Recent Labs: 07/26/2014: ALT 24 10/07/2014: Hemoglobin 12.7*; Magnesium 2.1; Platelets 144* 02/03/2015: BUN 46*; Creatinine, Ser 2.07*; Potassium 4.6; Sodium 137    Lipid Panel    Component Value Date/Time   CHOL 105 07/26/2014 0856   TRIG 102.0 07/26/2014 0856   HDL 24.10* 07/26/2014 0856   CHOLHDL 4 07/26/2014 0856   VLDL 20.4 07/26/2014 0856   LDLCALC 61 07/26/2014 0856      Wt Readings from Last 3 Encounters:  04/12/15 220 lb 6.4 oz (99.973 kg)  02/03/15 218 lb 9.6 oz (99.156 kg)  12/15/14 219 lb 12.8 oz (99.701 kg)        ASSESSMENT AND PLAN:  1.  Syncope - noted to have had VT with LBBB - now s/p CRTD implant - no recurrence -   2. Ischemic CM - scar on Myoview but no ischemia. No chest pain.  3. Systolic HF - looks compensated  4. CAD - no active chest pain  5. HTN -  6. HLD-no longer on niacin   Current medicines are reviewed at length with the patient today.  The patient does not have concerns regarding medicines.  The following changes have been made:  no change  Labs/ tests ordered today include:   Orders Placed This Encounter  Procedures  . Lipid panel  . Hepatic function panel  . Basic metabolic panel     Disposition: Continue current medication.  Work harder on weight loss and diet.  Recheck in 4 months for office visit lipid panel hepatic function panel and basal metabolic panel  Signed, Darlin Coco MD 04/12/2015 9:21 AM    [address], [place], [place]  [address] Phone: [phone]; Fax: [phone]

## 2015-04-12 NOTE — Patient Instructions (Signed)
Medication Instructions:  Your physician recommends that you continue on your current medications as directed. Please refer to the Current Medication list given to you today.  Labwork: none  Testing/Procedures: none  Follow-Up: Your physician wants you to follow-up in: 4 months with fasting labs (lp/bmet/hfp)  You will receive a reminder letter in the mail two months in advance. If you don't receive a letter, please call our office to schedule the follow-up appointment.  Work harder on diet

## 2015-04-15 ENCOUNTER — Other Ambulatory Visit: Payer: Self-pay | Admitting: Cardiology

## 2015-04-25 ENCOUNTER — Encounter: Payer: Self-pay | Admitting: Internal Medicine

## 2015-04-28 ENCOUNTER — Ambulatory Visit (INDEPENDENT_AMBULATORY_CARE_PROVIDER_SITE_OTHER): Payer: Medicare Other

## 2015-04-28 DIAGNOSIS — J4540 Moderate persistent asthma, uncomplicated: Secondary | ICD-10-CM

## 2015-05-01 DIAGNOSIS — E291 Testicular hypofunction: Secondary | ICD-10-CM | POA: Diagnosis not present

## 2015-05-02 MED ORDER — OMALIZUMAB 150 MG ~~LOC~~ SOLR
300.0000 mg | Freq: Once | SUBCUTANEOUS | Status: AC
  Administered 2015-04-28: 300 mg via SUBCUTANEOUS

## 2015-05-03 ENCOUNTER — Telehealth: Payer: Self-pay

## 2015-05-03 NOTE — Telephone Encounter (Signed)
#   vials:4 Ordered date:05/03/2015 per [place] Shipping Date:05/04/2015

## 2015-05-04 ENCOUNTER — Telehealth: Payer: Self-pay | Admitting: Critical Care Medicine

## 2015-05-04 NOTE — Telephone Encounter (Signed)
Created in error

## 2015-05-04 NOTE — Telephone Encounter (Signed)
#   Vials:4 Arrival Date:05/04/15 Lot [id number] Exp Date:5/20  Dr.Wright

## 2015-05-08 ENCOUNTER — Encounter: Payer: Self-pay | Admitting: Internal Medicine

## 2015-05-08 ENCOUNTER — Ambulatory Visit (INDEPENDENT_AMBULATORY_CARE_PROVIDER_SITE_OTHER): Payer: Medicare Other | Admitting: *Deleted

## 2015-05-08 DIAGNOSIS — I255 Ischemic cardiomyopathy: Secondary | ICD-10-CM

## 2015-05-08 NOTE — Progress Notes (Signed)
Remote ICD transmission.   

## 2015-05-10 LAB — CUP PACEART REMOTE DEVICE CHECK
Battery Remaining Longevity: 100 mo
Battery Voltage: 3.01 V
Brady Statistic AS VS Percent: 2.56 %
Brady Statistic RA Percent Paced: 0.9 %
Brady Statistic RV Percent Paced: 0.22 %
Date Time Interrogation Session: 20161003052302
HIGH POWER IMPEDANCE MEASURED VALUE: 71 Ohm
Lead Channel Impedance Value: 437 Ohm
Lead Channel Impedance Value: 513 Ohm
Lead Channel Impedance Value: 513 Ohm
Lead Channel Impedance Value: 570 Ohm
Lead Channel Impedance Value: 646 Ohm
Lead Channel Impedance Value: 646 Ohm
Lead Channel Pacing Threshold Amplitude: 0.75 V
Lead Channel Sensing Intrinsic Amplitude: 14.75 mV
Lead Channel Sensing Intrinsic Amplitude: 14.75 mV
Lead Channel Setting Pacing Amplitude: 1.75 V
Lead Channel Setting Pacing Amplitude: 5 V
MDC IDC MSMT LEADCHNL LV IMPEDANCE VALUE: 342 Ohm
MDC IDC MSMT LEADCHNL LV IMPEDANCE VALUE: 399 Ohm
MDC IDC MSMT LEADCHNL LV IMPEDANCE VALUE: 437 Ohm
MDC IDC MSMT LEADCHNL LV IMPEDANCE VALUE: 779 Ohm
MDC IDC MSMT LEADCHNL LV IMPEDANCE VALUE: 817 Ohm
MDC IDC MSMT LEADCHNL LV IMPEDANCE VALUE: 836 Ohm
MDC IDC MSMT LEADCHNL LV PACING THRESHOLD PULSEWIDTH: 0.6 ms
MDC IDC MSMT LEADCHNL RA IMPEDANCE VALUE: 437 Ohm
MDC IDC MSMT LEADCHNL RA PACING THRESHOLD AMPLITUDE: 0.5 V
MDC IDC MSMT LEADCHNL RA PACING THRESHOLD PULSEWIDTH: 0.4 ms
MDC IDC MSMT LEADCHNL RA SENSING INTR AMPL: 3.125 mV
MDC IDC MSMT LEADCHNL RA SENSING INTR AMPL: 3.125 mV
MDC IDC MSMT LEADCHNL RV PACING THRESHOLD AMPLITUDE: 2.5 V
MDC IDC MSMT LEADCHNL RV PACING THRESHOLD PULSEWIDTH: 0.4 ms
MDC IDC SET LEADCHNL LV PACING PULSEWIDTH: 0.6 ms
MDC IDC SET LEADCHNL RA PACING AMPLITUDE: 2 V
MDC IDC SET LEADCHNL RV PACING PULSEWIDTH: 0.4 ms
MDC IDC SET LEADCHNL RV SENSING SENSITIVITY: 0.3 mV
MDC IDC SET ZONE DETECTION INTERVAL: 300 ms
MDC IDC SET ZONE DETECTION INTERVAL: 350 ms
MDC IDC SET ZONE DETECTION INTERVAL: 400 ms
MDC IDC STAT BRADY AP VP PERCENT: 0.88 %
MDC IDC STAT BRADY AP VS PERCENT: 0.03 %
MDC IDC STAT BRADY AS VP PERCENT: 96.54 %
Zone Setting Detection Interval: 400 ms

## 2015-05-15 ENCOUNTER — Ambulatory Visit (INDEPENDENT_AMBULATORY_CARE_PROVIDER_SITE_OTHER): Payer: Medicare Other

## 2015-05-15 DIAGNOSIS — J4540 Moderate persistent asthma, uncomplicated: Secondary | ICD-10-CM

## 2015-05-16 MED ORDER — OMALIZUMAB 150 MG ~~LOC~~ SOLR
300.0000 mg | Freq: Once | SUBCUTANEOUS | Status: AC
  Administered 2015-05-15: 300 mg via SUBCUTANEOUS

## 2015-05-17 ENCOUNTER — Encounter: Payer: Self-pay | Admitting: *Deleted

## 2015-05-22 DIAGNOSIS — E291 Testicular hypofunction: Secondary | ICD-10-CM | POA: Diagnosis not present

## 2015-05-29 ENCOUNTER — Ambulatory Visit: Payer: Medicare Other

## 2015-05-30 ENCOUNTER — Ambulatory Visit (INDEPENDENT_AMBULATORY_CARE_PROVIDER_SITE_OTHER): Payer: Medicare Other

## 2015-05-30 DIAGNOSIS — J4540 Moderate persistent asthma, uncomplicated: Secondary | ICD-10-CM

## 2015-05-31 MED ORDER — OMALIZUMAB 150 MG ~~LOC~~ SOLR
300.0000 mg | Freq: Once | SUBCUTANEOUS | Status: AC
  Administered 2015-05-30: 300 mg via SUBCUTANEOUS

## 2015-06-05 ENCOUNTER — Other Ambulatory Visit: Payer: Self-pay | Admitting: Cardiology

## 2015-06-05 ENCOUNTER — Telehealth: Payer: Self-pay | Admitting: Cardiology

## 2015-06-05 MED ORDER — AMOXICILLIN 500 MG PO CAPS: 500.0000 mg | ORAL_CAPSULE | Freq: Three times a day (TID) | ORAL | Status: DC

## 2015-06-05 NOTE — Telephone Encounter (Signed)
Spoke with patient and he is having head congestion with lots of mucus Using Mucinex and it is not helping Discussed with  Dr. Mare Ferrari and will Rx Amoxil 500 mg TID #20 and continue Mucinex Advised patient

## 2015-06-05 NOTE — Telephone Encounter (Signed)
New message     Pt has a sinus inf and want to know if he can get a presc for an antibiotic?

## 2015-06-06 ENCOUNTER — Telehealth: Payer: Self-pay | Admitting: Pulmonary Disease

## 2015-06-06 NOTE — Telephone Encounter (Signed)
#   Vials:4 Arrival Date:06/06/15 Lot [id number] Exp Date:8/19

## 2015-06-13 ENCOUNTER — Ambulatory Visit: Payer: Medicare Other

## 2015-06-13 ENCOUNTER — Other Ambulatory Visit: Payer: Self-pay | Admitting: Cardiology

## 2015-06-14 ENCOUNTER — Ambulatory Visit (INDEPENDENT_AMBULATORY_CARE_PROVIDER_SITE_OTHER): Payer: Medicare Other

## 2015-06-14 ENCOUNTER — Encounter: Payer: Self-pay | Admitting: Cardiology

## 2015-06-14 DIAGNOSIS — J4520 Mild intermittent asthma, uncomplicated: Secondary | ICD-10-CM | POA: Diagnosis not present

## 2015-06-14 DIAGNOSIS — E291 Testicular hypofunction: Secondary | ICD-10-CM | POA: Diagnosis not present

## 2015-06-14 MED ORDER — OMALIZUMAB 150 MG ~~LOC~~ SOLR
300.0000 mg | Freq: Once | SUBCUTANEOUS | Status: AC
  Administered 2015-06-14: 300 mg via SUBCUTANEOUS

## 2015-06-14 NOTE — Telephone Encounter (Signed)
Pt requesting a refill on Metformin. Please advise

## 2015-06-17 ENCOUNTER — Other Ambulatory Visit: Payer: Self-pay | Admitting: Cardiology

## 2015-06-22 DIAGNOSIS — Z23 Encounter for immunization: Secondary | ICD-10-CM | POA: Diagnosis not present

## 2015-06-30 ENCOUNTER — Ambulatory Visit: Payer: Medicare Other

## 2015-07-03 ENCOUNTER — Ambulatory Visit (INDEPENDENT_AMBULATORY_CARE_PROVIDER_SITE_OTHER): Payer: Medicare Other

## 2015-07-03 DIAGNOSIS — J4540 Moderate persistent asthma, uncomplicated: Secondary | ICD-10-CM | POA: Diagnosis not present

## 2015-07-03 MED ORDER — OMALIZUMAB 150 MG ~~LOC~~ SOLR
300.0000 mg | Freq: Once | SUBCUTANEOUS | Status: AC
  Administered 2015-07-03: 300 mg via SUBCUTANEOUS

## 2015-07-05 DIAGNOSIS — E291 Testicular hypofunction: Secondary | ICD-10-CM | POA: Diagnosis not present

## 2015-07-12 ENCOUNTER — Telehealth: Payer: Self-pay | Admitting: Pulmonary Disease

## 2015-07-12 NOTE — Telephone Encounter (Signed)
#   vials:4 Ordered date:07/12/15 Shipping Date:07/12/15

## 2015-07-13 NOTE — Telephone Encounter (Signed)
#   Vials:4 Arrival Date:07/13/15 Lot [id number] Exp Date:6/20

## 2015-07-17 ENCOUNTER — Encounter: Payer: Self-pay | Admitting: Pulmonary Disease

## 2015-07-17 ENCOUNTER — Ambulatory Visit (INDEPENDENT_AMBULATORY_CARE_PROVIDER_SITE_OTHER): Payer: Medicare Other | Admitting: Pulmonary Disease

## 2015-07-17 ENCOUNTER — Ambulatory Visit: Payer: Medicare Other

## 2015-07-17 ENCOUNTER — Telehealth: Payer: Self-pay | Admitting: Pulmonary Disease

## 2015-07-17 VITALS — BP 130/80 | HR 65 | Ht 70.5 in | Wt 221.0 lb

## 2015-07-17 DIAGNOSIS — J302 Other seasonal allergic rhinitis: Secondary | ICD-10-CM

## 2015-07-17 DIAGNOSIS — Z23 Encounter for immunization: Secondary | ICD-10-CM | POA: Diagnosis not present

## 2015-07-17 DIAGNOSIS — J4540 Moderate persistent asthma, uncomplicated: Secondary | ICD-10-CM | POA: Diagnosis not present

## 2015-07-17 DIAGNOSIS — I255 Ischemic cardiomyopathy: Secondary | ICD-10-CM | POA: Diagnosis not present

## 2015-07-17 NOTE — Addendum Note (Signed)
Addended by: Inge Rise on: 07/17/2015 03:12 PM   Modules accepted: Orders

## 2015-07-17 NOTE — Progress Notes (Signed)
Subjective:    Patient ID: Shane [name], male    DOB: 1933/09/12, 81 y.o.   MRN: [id number]  C.C.:  Follow-up for Moderate, Persistent Asthma & Allergic Rhinitis.  HPI Moderate, Persistent Asthma:  Patient on chronic therapy with Xolair over 1 year. He reports his frequency of exacerbations & sinusitis have decreased dramatically. He reports he has had an episode of respiratory illness in September starting as a sinus infection. He did receive treatment with Amoxil & Mucinex. He didn't need any steroid therapy. He denies any coughing or wheezing. Wife reports he does wheeze intermittently. Currently doesn't require any inhalers. He does not have a rescue inhaler at home either. No nocturnal awakenings with breathing problems. No adverse effects from Xolair therapy.  Allergic Rhinitis:  He reports mostly seasonal sinus problems in the Fall & Spring. He reports only mild sinus congestion & pressure first thing in the morning but none currently. Previously was treated with allergy shots without benefit. Intermittently uses "anti-histamine" medications.  Review of Systems No chest pain, tightness, or pressure. No fever, chills, or sweats.   Allergies  Allergen Reactions  . Codeine     anxiety  . Hydrocodone     anxiety  . Azithromycin Rash    Current Outpatient Prescriptions on File Prior to Visit  Medication Sig Dispense Refill  . acetaminophen (TYLENOL) 500 MG tablet Take 1,000 mg by mouth as needed for pain.     Marland Kitchen amLODipine (NORVASC) 10 MG tablet Take 10 mg by mouth daily.    Marland Kitchen aspirin EC 81 MG tablet Take 81 mg by mouth every morning.    Marland Kitchen atorvastatin (LIPITOR) 10 MG tablet Take 10 mg by mouth daily.    Marland Kitchen b complex vitamins tablet Take 1 tablet by mouth daily.    . carvedilol (COREG) 12.5 MG tablet Take 1.5 tablets (18.75 mg total) by mouth 2 (two) times daily. 270 tablet 3  . Cholecalciferol (VITAMIN D-3) 1000 UNITS CAPS Take 2,000 Units by mouth daily.    Marland Kitchen CIALIS 20 MG tablet  Take 20 mg by mouth daily as needed for erectile dysfunction.     . diphenhydrAMINE (BENADRYL) 25 mg capsule Take 25 mg by mouth every 6 (six) hours as needed for allergies.     Marland Kitchen EPINEPHrine (EPIPEN) 0.3 mg/0.3 mL SOAJ injection Inject 0.3 mLs (0.3 mg total) into the muscle once. 2 Device 0  . fluocinonide cream (LIDEX) [id number] % Apply 1 application topically 2 (two) times daily.    . metFORMIN (GLUCOPHAGE) 500 MG tablet TAKE ONE TABLET BY MOUTH ONCE DAILY 90 tablet 3  . Multiple Vitamin (MULTIVITAMIN) tablet Take 1 tablet by mouth daily.    . naproxen sodium (ANAPROX) 220 MG tablet Take 220 mg by mouth daily as needed (back pain).    . NORVASC 10 MG tablet Take 1 tablet (10 mg total) by mouth daily. 90 tablet 1  . omalizumab (XOLAIR) 150 MG injection Inject 150 mg into the skin every 14 (fourteen) days.    Marland Kitchen spironolactone (ALDACTONE) 25 MG tablet Take 0.5 tablets (12.5 mg total) by mouth daily. 15 tablet 11  . testosterone cypionate (DEPOTESTOTERONE CYPIONATE) 100 MG/ML injection Inject 400 mg into the muscle every 21 ( twenty-one) days. For IM use only    . triamcinolone (NASACORT AQ) 55 MCG/ACT AERO nasal inhaler Place 2 sprays into the nose daily.    . valsartan-hydrochlorothiazide (DIOVAN-HCT) 320-12.5 MG per tablet Take 1 tablet by mouth daily.    Marland Kitchen zolpidem (  AMBIEN) 10 MG tablet TAKE ONE-HALF TO ONE TABLET BY MOUTH AT BEDTIME AS NEEDED FOR SLEEP 30 tablet 5   No current facility-administered medications on file prior to visit.    Past Medical History  Diagnosis Date  . Coronary artery disease     remote CABG in 1985, cath in 2003 by Dr. Lia Foyer with no PCI, last nuclear in 2010 showing scar and EF of 28%.   . Dyslipidemia   . Hypercholesterolemia   . Left ventricular dysfunction     28% per nuclear in 2010 and 35 to 40% per echo in 2010  . HTN (hypertension)   . Diabetes mellitus   . Cellulitis of arm, left     MSSA  . Spinal stenosis   . BPH (benign prostatic hyperplasia)   .  Allergic rhinitis 01-08-13    Uses nebulizer for chronic sinus issues and Mucinex.  . Abnormal nuclear cardiac imaging test Nov 2010    moderate area of infarct in the inferior wall with only minimal reversibility and EF of 28%  . Myocardial infarction ([place])     1985  . Pneumonia     hx of several times years ago   . Blood transfusion     ? at time of bypass surgery   . Arthritis     osteoarthritis   . Colon polyps     adenomatous  . Neuromuscular disorder ([place])     legs mild paralysis-able to walk"nerve damage"-legs- left leg brace   . CKD (chronic kidney disease), stage III     "was told due to meds he takes"-no Renal workup done  . Glaucoma 01-08-13    tx. eye drops    Past Surgical History  Procedure Laterality Date  . Cardiac catheterization  2003  . Lumbar disc surgery    . Prostate surgery    . Back surgery      hx of back surgery x 4   . Total knee arthroplasty  08/01/2011    Procedure: TOTAL KNEE ARTHROPLASTY;  Surgeon: Johnn Hai;  Location: [facility];  Service: Orthopedics;  Laterality: Right;  . Cataract       recent cataract surgery 6'14  . Coronary artery bypass graft  1985    x 5 vessels  . Colonoscopy N/A 01/25/2013    Procedure: COLONOSCOPY;  Surgeon: Irene Shipper, MD;  Location: [facility];  Service: Endoscopy;  Laterality: N/A;  . Bi-ventricular implantable cardioverter defibrillator N/A 10/10/2014    Procedure: BI-VENTRICULAR IMPLANTABLE CARDIOVERTER DEFIBRILLATOR  (CRT-D);  Surgeon: [name], MD;  Location: [facility] CATH LAB;  Service: Cardiovascular;  Laterality: N/A;    Family History  Problem Relation Age of Onset  . Heart attack Father   . Heart disease Father   . Colon polyps Father   . Diabetes Sister     x 2  . Heart disease Brother     x 2  . Lung cancer Mother   . Bone cancer Brother   . Lung disease Neg Hx     Social History   Social History  . Marital Status: Married    Spouse Name: N/A  . Number of Children: 2  . Years of  Education: N/A   Occupational History  . pastor    Social History Main Topics  . Smoking status: Former Smoker -- 1.00 packs/day for 5 years    Types: Cigarettes, Pipe, Cigars    Quit date: 08/05/1958  . Smokeless tobacco: Never Used  .  Alcohol Use: No  . Drug Use: No  . Sexual Activity: Not Currently   Other Topics Concern  . None   Social History Narrative   Originally from [place]. He has always lived in [place]. Prior travel to [place], [place], [place], [place] ,[place], & [place]. 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 Has adopted children. Has a dog currently. No bird exposure. No mold exposure. Enjoys reading & traveling.      Objective:   Physical Exam BP 130/80 mmHg  Pulse 65  Ht 5' 10.5" (1.791 m)  Wt 221 lb (100.245 kg)  BMI 31.25 kg/m2  SpO2 95% General:  Awake. Alert. No acute distress. Obese.  Integument:  Warm & dry. No rash on exposed skin. No bruising. HEENT:  Moist mucus membranes. No oral ulcers. No scleral injection or icterus. Moderate bilateral nasal turbinate swelling. Cardiovascular:  Regular rate. No edema. No appreciable JVD.  Pulmonary:  Good aeration & clear to auscultation bilaterally. Symmetric chest wall expansion. No accessory muscle use on room air. Abdomen: Soft. Normal bowel sounds. Protuberant. Grossly nontender. Musculoskeletal:  Normal bulk and tone. Hand grip strength 5/5 bilaterally. No joint deformity or effusion appreciated.  PFT 08/30/13: FVC 3.84 L (91%) FEV1 2.85 L (94%) FEV1/FVC 0.74 FEF 25-75 2.10 L (98%) no bronchodilator response TLC 5.94 L (83%) ERV 84% DLCO uncorrected 64%  IMAGING CXR PA/LAT 10/11/14 (personally reviewed by me): No opacity or effusion appreciated. Heart normal in size. Pacemaker with leads noted. Low lung volumes.  V/Q 10/06/14 (per radiologist): Heterogeneous ventilation without focal defect. No evidence of pulmonary embolism.  CARDIAC TTE (10/06/14): LV mildly dilated. EF 35-40%. Regional wall  motion abnormalities noted. LA moderately dilated & RA normal in size. RV normal in size and function. Pulmonary artery systolic pressure 47 mmHg. No aortic regurgitation or stenosis. Moderate mitral regurgitation without stenosis. No pulmonic regurgitation. Mild tricuspid regurgitation. No pericardial effusion.  LABS 07/07/13 IgE: 209.1 RAST Panel: Ragweed 0.28 (class 0/1)    Assessment & Plan:  81 year old male with underlying moderate, persistent asthma. Patient has experienced significant symptomatic benefit from both his allergic rhinitis and underlying asthma with the initiation of Xolair therapy. This frequency of exacerbations has dramatically decreased and it sounds as though he had an upper respiratory tract infection rather than an asthma exacerbation in September given the improved without the use of steroid therapy. I instructed the patient to notify my office if he had any new breathing problems before his next appointment as I would be happy to see him sooner.  1. Moderate, persistent asthma: Continuing patient on Xolair therapy no changes. Plan to check full pulmonary function testing at next appointment. 2. Allergic rhinitis: Currently asymptomatic. Continuing Xolair therapy. 3. Health maintenance: Patient reports he remotely received pneumonia vaccine. Received influenza vaccine 06/22/2015. Administering Prevnar vaccine today.  4. Follow-up: Patient to return to clinic in 6 months or sooner if needed.

## 2015-07-17 NOTE — Patient Instructions (Signed)
1. Continue taking medications as prescribed. 2. Call me if you have any new breathing problems before your next appointment. 3. We will schedule you for breathing tests on the day of your next appointment. 4. I will see you back in 6 months or sooner if needed.

## 2015-07-17 NOTE — Telephone Encounter (Signed)
PFT 08/30/13: FVC 3.84 L (91%) FEV1 2.85 L (94%) FEV1/FVC 0.74 FEF 25-75 2.10 L (98%) no bronchodilator response TLC 5.94 L (83%) ERV 84% DLCO uncorrected 64%  IMAGING CXR PA/LAT 10/11/14 (personally reviewed by me): No opacity or effusion appreciated. Heart normal in size. Pacemaker with leads noted. Low lung volumes.  V/Q 10/06/14 (per radiologist): Heterogeneous ventilation without focal defect. No evidence of pulmonary embolism.  CARDIAC TTE (10/06/14): LV mildly dilated. EF 35-40%. Regional wall motion abnormalities noted. LA moderately dilated & RA normal in size. RV normal in size and function. Pulmonary artery systolic pressure 47 mmHg. No aortic regurgitation or stenosis. Moderate mitral regurgitation without stenosis. No pulmonic regurgitation. Mild tricuspid regurgitation. No pericardial effusion.  LABS 07/07/13 IgE: 209.1 RAST Panel: Ragweed 0.28 (class 0/1)

## 2015-07-26 DIAGNOSIS — E291 Testicular hypofunction: Secondary | ICD-10-CM | POA: Diagnosis not present

## 2015-07-28 MED ORDER — OMALIZUMAB 150 MG ~~LOC~~ SOLR
300.0000 mg | Freq: Once | SUBCUTANEOUS | Status: AC
  Administered 2015-07-17: 300 mg via SUBCUTANEOUS

## 2015-07-28 NOTE — Addendum Note (Signed)
Addended by: Lorane Gell on: 07/28/2015 04:16 PM   Modules accepted: Orders

## 2015-08-01 ENCOUNTER — Ambulatory Visit (INDEPENDENT_AMBULATORY_CARE_PROVIDER_SITE_OTHER): Payer: Medicare Other

## 2015-08-01 DIAGNOSIS — J4540 Moderate persistent asthma, uncomplicated: Secondary | ICD-10-CM | POA: Diagnosis not present

## 2015-08-03 ENCOUNTER — Telehealth: Payer: Self-pay | Admitting: Cardiology

## 2015-08-03 ENCOUNTER — Telehealth: Payer: Self-pay | Admitting: Pulmonary Disease

## 2015-08-03 DIAGNOSIS — R0602 Shortness of breath: Secondary | ICD-10-CM

## 2015-08-03 MED ORDER — OMALIZUMAB 150 MG ~~LOC~~ SOLR
300.0000 mg | Freq: Once | SUBCUTANEOUS | Status: AC
  Administered 2015-08-01: 300 mg via SUBCUTANEOUS

## 2015-08-03 NOTE — Telephone Encounter (Signed)
Pt called in and stated that he is experiencing some shortness of breath. Instructed pt to send manual transmission and someone would call him back once it is received. Pt verbalized understanding.

## 2015-08-03 NOTE — Telephone Encounter (Signed)
Spoke to patient about his recent ShOB, DOE, and slight orthopnea. Patient states that his sx's first began "some weeks" ago. He states that he hasn't noticed an increase in his chronic LE edema and he has only gained 4-5 lbs this year. Patient admits to eating foods containing sodium, but states that he does not add any salt. I encouraged patient to be more mindful of his consumption of sodium. Patient voiced understanding.  Will discuss patient sx's w/Dr.Klein and call patient with any further recommendations. Again, patient voiced understanding.

## 2015-08-04 NOTE — Telephone Encounter (Signed)
LMTCB/SSS  NEEDS BMP

## 2015-08-08 ENCOUNTER — Telehealth: Payer: Self-pay | Admitting: Internal Medicine

## 2015-08-08 ENCOUNTER — Ambulatory Visit (INDEPENDENT_AMBULATORY_CARE_PROVIDER_SITE_OTHER): Payer: Medicare Other | Admitting: *Deleted

## 2015-08-08 DIAGNOSIS — I255 Ischemic cardiomyopathy: Secondary | ICD-10-CM | POA: Diagnosis not present

## 2015-08-08 NOTE — Telephone Encounter (Signed)
Informed patient that Dr.Klein recommends that he have a BMP. Patient voiced understanding and states that he will have it done on Friday.

## 2015-08-08 NOTE — Telephone Encounter (Signed)
New message      Returning a call to [facility]

## 2015-08-08 NOTE — Progress Notes (Signed)
Remote ICD transmission.   

## 2015-08-08 NOTE — Telephone Encounter (Signed)
See note dated 12/29 for details.

## 2015-08-08 NOTE — Telephone Encounter (Signed)
LMTCB//sss 

## 2015-08-09 NOTE — Telephone Encounter (Signed)
#   Vials:4 Arrival Date:08/03/15 Lot [id number] Exp Date:6/20

## 2015-08-10 LAB — CUP PACEART REMOTE DEVICE CHECK
Brady Statistic RV Percent Paced: 0.06 %
HighPow Impedance: 61 Ohm
Implantable Lead Implant Date: 20160308
Implantable Lead Implant Date: 20160308
Implantable Lead Implant Date: 20160308
Implantable Lead Location: 753858
Implantable Lead Location: 753860
Implantable Lead Model: 4598
Implantable Lead Model: 6935
Lead Channel Impedance Value: 342 Ohm
Lead Channel Impedance Value: 494 Ohm
Lead Channel Impedance Value: 513 Ohm
Lead Channel Impedance Value: 570 Ohm
Lead Channel Impedance Value: 722 Ohm
Lead Channel Impedance Value: 779 Ohm
Lead Channel Impedance Value: 817 Ohm
Lead Channel Pacing Threshold Amplitude: 0.625 V
Lead Channel Pacing Threshold Amplitude: 0.75 V
Lead Channel Pacing Threshold Pulse Width: 0.4 ms
Lead Channel Pacing Threshold Pulse Width: 0.6 ms
Lead Channel Sensing Intrinsic Amplitude: 2.625 mV
Lead Channel Sensing Intrinsic Amplitude: 2.625 mV
Lead Channel Setting Sensing Sensitivity: 0.3 mV
MDC IDC LEAD LOCATION: 753859
MDC IDC MSMT BATTERY REMAINING LONGEVITY: 106 mo
MDC IDC MSMT BATTERY VOLTAGE: 3.01 V
MDC IDC MSMT LEADCHNL LV IMPEDANCE VALUE: 399 Ohm
MDC IDC MSMT LEADCHNL LV IMPEDANCE VALUE: 437 Ohm
MDC IDC MSMT LEADCHNL LV IMPEDANCE VALUE: 646 Ohm
MDC IDC MSMT LEADCHNL LV IMPEDANCE VALUE: 646 Ohm
MDC IDC MSMT LEADCHNL RA IMPEDANCE VALUE: 456 Ohm
MDC IDC MSMT LEADCHNL RV IMPEDANCE VALUE: 494 Ohm
MDC IDC MSMT LEADCHNL RV PACING THRESHOLD AMPLITUDE: 2.125 V
MDC IDC MSMT LEADCHNL RV PACING THRESHOLD PULSEWIDTH: 0.4 ms
MDC IDC MSMT LEADCHNL RV SENSING INTR AMPL: 15.5 mV
MDC IDC MSMT LEADCHNL RV SENSING INTR AMPL: 15.5 mV
MDC IDC SESS DTM: 20170103052203
MDC IDC SET LEADCHNL LV PACING AMPLITUDE: 1.75 V
MDC IDC SET LEADCHNL LV PACING PULSEWIDTH: 0.6 ms
MDC IDC SET LEADCHNL RA PACING AMPLITUDE: 2 V
MDC IDC STAT BRADY AP VP PERCENT: 1.14 %
MDC IDC STAT BRADY AP VS PERCENT: 0.05 %
MDC IDC STAT BRADY AS VP PERCENT: 97.2 %
MDC IDC STAT BRADY AS VS PERCENT: 1.6 %
MDC IDC STAT BRADY RA PERCENT PACED: 1.2 %

## 2015-08-11 ENCOUNTER — Other Ambulatory Visit (INDEPENDENT_AMBULATORY_CARE_PROVIDER_SITE_OTHER): Payer: Medicare Other | Admitting: *Deleted

## 2015-08-11 ENCOUNTER — Encounter: Payer: Self-pay | Admitting: Cardiology

## 2015-08-11 DIAGNOSIS — R0602 Shortness of breath: Secondary | ICD-10-CM | POA: Diagnosis not present

## 2015-08-11 LAB — BASIC METABOLIC PANEL WITH GFR
BUN: 47 mg/dL — AB (ref 7–25)
CALCIUM: 9.1 mg/dL (ref 8.6–10.3)
CO2: 28 mmol/L (ref 20–31)
Chloride: 104 mmol/L (ref 98–110)
Creat: 2.07 mg/dL — ABNORMAL HIGH (ref 0.70–1.11)
GFR, EST AFRICAN AMERICAN: 34 mL/min — AB (ref >=60)
GFR, EST NON AFRICAN AMERICAN: 29 mL/min — AB (ref >=60)
Glucose, Bld: 123 mg/dL — ABNORMAL HIGH (ref 65–99)
POTASSIUM: 4.6 mmol/L (ref 3.5–5.3)
Sodium: 137 mmol/L (ref 135–146)

## 2015-08-11 NOTE — Addendum Note (Signed)
Addended by: Eulis Foster on: 08/11/2015 11:41 AM   Modules accepted: Orders

## 2015-08-14 ENCOUNTER — Other Ambulatory Visit: Payer: Self-pay | Admitting: Cardiology

## 2015-08-14 DIAGNOSIS — G4700 Insomnia, unspecified: Secondary | ICD-10-CM

## 2015-08-15 ENCOUNTER — Ambulatory Visit: Payer: Medicare Other

## 2015-08-16 DIAGNOSIS — E291 Testicular hypofunction: Secondary | ICD-10-CM | POA: Diagnosis not present

## 2015-08-24 ENCOUNTER — Telehealth: Payer: Self-pay | Admitting: *Deleted

## 2015-08-24 NOTE — Telephone Encounter (Signed)
lmovm to call back for results

## 2015-09-04 ENCOUNTER — Ambulatory Visit (INDEPENDENT_AMBULATORY_CARE_PROVIDER_SITE_OTHER): Payer: Medicare Other

## 2015-09-04 DIAGNOSIS — J4540 Moderate persistent asthma, uncomplicated: Secondary | ICD-10-CM

## 2015-09-05 MED ORDER — OMALIZUMAB 150 MG ~~LOC~~ SOLR
300.0000 mg | Freq: Once | SUBCUTANEOUS | Status: AC
  Administered 2015-09-04: 300 mg via SUBCUTANEOUS

## 2015-09-06 DIAGNOSIS — M25562 Pain in left knee: Secondary | ICD-10-CM | POA: Diagnosis not present

## 2015-09-06 DIAGNOSIS — M1712 Unilateral primary osteoarthritis, left knee: Secondary | ICD-10-CM | POA: Diagnosis not present

## 2015-09-07 DIAGNOSIS — E291 Testicular hypofunction: Secondary | ICD-10-CM | POA: Diagnosis not present

## 2015-09-15 DIAGNOSIS — H00012 Hordeolum externum right lower eyelid: Secondary | ICD-10-CM | POA: Diagnosis not present

## 2015-09-18 ENCOUNTER — Other Ambulatory Visit (INDEPENDENT_AMBULATORY_CARE_PROVIDER_SITE_OTHER): Payer: Medicare Other | Admitting: *Deleted

## 2015-09-18 ENCOUNTER — Ambulatory Visit (INDEPENDENT_AMBULATORY_CARE_PROVIDER_SITE_OTHER): Payer: Medicare Other

## 2015-09-18 ENCOUNTER — Ambulatory Visit (INDEPENDENT_AMBULATORY_CARE_PROVIDER_SITE_OTHER): Payer: Medicare Other | Admitting: Cardiology

## 2015-09-18 ENCOUNTER — Encounter: Payer: Self-pay | Admitting: Cardiology

## 2015-09-18 VITALS — BP 122/80 | HR 68 | Ht 70.5 in | Wt 218.8 lb

## 2015-09-18 DIAGNOSIS — J4540 Moderate persistent asthma, uncomplicated: Secondary | ICD-10-CM | POA: Diagnosis not present

## 2015-09-18 DIAGNOSIS — I119 Hypertensive heart disease without heart failure: Secondary | ICD-10-CM | POA: Diagnosis not present

## 2015-09-18 DIAGNOSIS — I255 Ischemic cardiomyopathy: Secondary | ICD-10-CM

## 2015-09-18 LAB — LIPID PANEL
Cholesterol: 104 mg/dL — ABNORMAL LOW (ref 125–200)
HDL: 25 mg/dL — ABNORMAL LOW (ref >=40)
LDL CALC: 60 mg/dL (ref <130)
Total CHOL/HDL Ratio: 4.2 Ratio (ref <=5.0)
Triglycerides: 97 mg/dL (ref <150)
VLDL: 19 mg/dL (ref <30)

## 2015-09-18 LAB — HEPATIC FUNCTION PANEL
ALBUMIN: 4 g/dL (ref 3.6–5.1)
ALK PHOS: 42 U/L (ref 40–115)
ALT: 15 U/L (ref 9–46)
AST: 19 U/L (ref 10–35)
BILIRUBIN DIRECT: 0.3 mg/dL — AB (ref <=0.2)
BILIRUBIN INDIRECT: 1 mg/dL (ref 0.2–1.2)
BILIRUBIN TOTAL: 1.3 mg/dL — AB (ref 0.2–1.2)
Total Protein: 6.9 g/dL (ref 6.1–8.1)

## 2015-09-18 LAB — BASIC METABOLIC PANEL
BUN: 51 mg/dL — ABNORMAL HIGH (ref 7–25)
CHLORIDE: 102 mmol/L (ref 98–110)
CO2: 26 mmol/L (ref 20–31)
CREATININE: 2.14 mg/dL — AB (ref 0.70–1.11)
Calcium: 9.2 mg/dL (ref 8.6–10.3)
GLUCOSE: 108 mg/dL — AB (ref 65–99)
Potassium: 4.6 mmol/L (ref 3.5–5.3)
Sodium: 136 mmol/L (ref 135–146)

## 2015-09-18 NOTE — Progress Notes (Signed)
Cardiology Office Note   Date:  09/18/2015   ID:  Shane [name], DOB 05/25/1934, MRN [id number]  PCP:  Shane Danes, MD  Cardiologist: Shane Coco MD  No chief complaint on file.     History of Present Illness: Shane [name] is a 82 y.o. male who presents for scheduled follow-up visit.  He has an ischemic CM, CAD with prior CABG, HLD, HTN, DM, CK and OA.  Presented with VT in the setting of LBBB and syncope - had CRTD implanted on 10/10/2014. Dr. Caryl [name] is his electrophysiologist. Myoview demonstrated scar but no ischemia. . ACE and BB continued but he was switched from Toprol to Coreg. He has not been tolerating 25 mg carvedilol twice a day but is tolerating 18.75 mg twice a day He has not been having any increased shortness of breath. He has not been aware of any racing of his heart.He has not had any shocks from his defibrillator that he is aware of.  He has had no further syncopal episodes. . He has a Medtronic ICD with CRT. He has not been having any chest pain or shortness of breath. No dizziness. He has had some sinus congestion for which he takes Mucinex. He has had some peripheral edema from his amlodipine. He has a history of pulmonary allergies and is followed now by Dr. Ashok [name].  Past Medical History  Diagnosis Date  . Coronary artery disease     remote CABG in 1985, cath in 2003 by Dr. Lia [name] with no PCI, last nuclear in 2010 showing scar and EF of 28%.   . Dyslipidemia   . Hypercholesterolemia   . Left ventricular dysfunction     28% per nuclear in 2010 and 35 to 40% per echo in 2010  . HTN (hypertension)   . Diabetes mellitus   . Cellulitis of arm, left     MSSA  . Spinal stenosis   . BPH (benign prostatic hyperplasia)   . Allergic rhinitis 01-08-13    Uses nebulizer for chronic sinus issues and Mucinex.  . Abnormal nuclear cardiac imaging test Nov 2010    moderate area of infarct in the inferior wall with only minimal reversibility and EF  of 28%  . Myocardial infarction ([place])     1985  . Pneumonia     hx of several times years ago   . Blood transfusion     ? at time of bypass surgery   . Arthritis     osteoarthritis   . Colon polyps     adenomatous  . Neuromuscular disorder ([place])     legs mild paralysis-able to walk"nerve damage"-legs- left leg brace   . CKD (chronic kidney disease), stage III     "was told due to meds he takes"-no Renal workup done  . Glaucoma 01-08-13    tx. eye drops    Past Surgical History  Procedure Laterality Date  . Cardiac catheterization  2003  . Lumbar disc surgery    . Prostate surgery    . Back surgery      hx of back surgery x 4   . Total knee arthroplasty  08/01/2011    Procedure: TOTAL KNEE ARTHROPLASTY;  Surgeon: Johnn Hai;  Location: [facility];  Service: Orthopedics;  Laterality: Right;  . Cataract       recent cataract surgery 6'14  . Coronary artery bypass graft  1985    x 5 vessels  . Colonoscopy N/A 01/25/2013    Procedure:  COLONOSCOPY;  Surgeon: Shane Shipper, MD;  Location: [facility];  Service: Endoscopy;  Laterality: N/A;  . Bi-ventricular implantable cardioverter defibrillator N/A 10/10/2014    Procedure: BI-VENTRICULAR IMPLANTABLE CARDIOVERTER DEFIBRILLATOR  (CRT-D);  Surgeon: Shane Sprang, MD;  Location: [facility] CATH LAB;  Service: Cardiovascular;  Laterality: N/A;     Current Outpatient Prescriptions  Medication Sig Dispense Refill  . acetaminophen (TYLENOL) 500 MG tablet Take 1,000 mg by mouth as needed for pain.     Marland Kitchen amLODipine (NORVASC) 10 MG tablet Take 10 mg by mouth daily.    Marland Kitchen aspirin EC 81 MG tablet Take 81 mg by mouth every morning.    Marland Kitchen atorvastatin (LIPITOR) 10 MG tablet Take 10 mg by mouth daily.    Marland Kitchen b complex vitamins tablet Take 1 tablet by mouth daily.    . carvedilol (COREG) 12.5 MG tablet Take 1.5 tablets (18.75 mg total) by mouth 2 (two) times daily. 270 tablet 3  . Cholecalciferol (VITAMIN D-3) 1000 UNITS CAPS Take 2,000 Units by mouth  daily.    Marland Kitchen CIALIS 20 MG tablet Take 20 mg by mouth daily as needed for erectile dysfunction.     . diphenhydrAMINE (BENADRYL) 25 mg capsule Take 25 mg by mouth every 6 (six) hours as needed for allergies.     Marland Kitchen EPINEPHrine (EPIPEN) 0.3 mg/0.3 mL SOAJ injection Inject 0.3 mLs (0.3 mg total) into the muscle once. 2 Device 0  . fluocinonide cream (LIDEX) [id number] % Apply 1 application topically 2 (two) times daily.    . metFORMIN (GLUCOPHAGE) 500 MG tablet TAKE ONE TABLET BY MOUTH ONCE DAILY 90 tablet 3  . Multiple Vitamin (MULTIVITAMIN) tablet Take 1 tablet by mouth daily.    . naproxen sodium (ANAPROX) 220 MG tablet Take 220 mg by mouth daily as needed (back pain).    . NORVASC 10 MG tablet Take 1 tablet (10 mg total) by mouth daily. 90 tablet 1  . omalizumab (XOLAIR) 150 MG injection Inject 150 mg into the skin every 14 (fourteen) days.    Marland Kitchen spironolactone (ALDACTONE) 25 MG tablet Take 0.5 tablets (12.5 mg total) by mouth daily. 15 tablet 11  . testosterone cypionate (DEPOTESTOTERONE CYPIONATE) 100 MG/ML injection Inject 400 mg into the muscle every 21 ( twenty-one) days. For IM use only    . triamcinolone (NASACORT AQ) 55 MCG/ACT AERO nasal inhaler Place 2 sprays into the nose daily.    . valsartan-hydrochlorothiazide (DIOVAN-HCT) 320-12.5 MG per tablet Take 1 tablet by mouth daily.    Marland Kitchen zolpidem (AMBIEN) 10 MG tablet Take 1/2 to 1 tablet at bedtime as needed for sleep. Additional refills from PCP 30 tablet 2   No current facility-administered medications for this visit.    Allergies:   Codeine; Hydrocodone; and Azithromycin    Social History:  The patient  reports that he quit smoking about 57 years ago. His smoking use included Cigarettes, Pipe, and Cigars. He has a 5 pack-year smoking history. He has never used smokeless tobacco. He reports that he does not drink alcohol or use illicit drugs.   Family History:  The patient's family history includes Bone cancer in his brother; Colon polyps in  his father; Diabetes in his sister; Heart attack in his father; Heart disease in his brother and father; Lung cancer in his mother. There is no history of Lung disease.    ROS:  Please see the history of present illness.   Otherwise, review of systems are positive for none.  All other systems are reviewed and negative.    PHYSICAL EXAM: VS:  BP 122/80 mmHg  Pulse 68  Ht 5' 10.5" (1.791 m)  Wt 218 lb 12.8 oz (99.247 kg)  BMI 30.94 kg/m2 , BMI Body mass index is 30.94 kg/(m^2). GEN: Well nourished, well developed, in no acute distress HEENT: normal Neck: no JVD, carotid bruits, or masses Cardiac: RRR; no murmurs, rubs, or gallops,no edema  Respiratory:  clear to auscultation bilaterally, normal work of breathing GI: soft, nontender, nondistended, + BS MS: no deformity or atrophy Skin: warm and dry, no rash Neuro:  Strength and sensation are intact Psych: euthymic mood, full affect   EKG:  EKG is not ordered today.    Recent Labs: 10/07/2014: Hemoglobin 12.7*; Magnesium 2.1; Platelets 144* 08/11/2015: BUN 47*; Creat 2.07*; Potassium 4.6; Sodium 137    Lipid Panel    Component Value Date/Time   CHOL 105 07/26/2014 0856   TRIG 102.0 07/26/2014 0856   HDL 24.10* 07/26/2014 0856   CHOLHDL 4 07/26/2014 0856   VLDL 20.4 07/26/2014 0856   LDLCALC 61 07/26/2014 0856      Wt Readings from Last 3 Encounters:  09/18/15 218 lb 12.8 oz (99.247 kg)  07/17/15 221 lb (100.245 kg)  04/12/15 220 lb 6.4 oz (99.973 kg)         ASSESSMENT AND PLAN: 1. Syncope - noted to have had VT with LBBB - now s/p CRTD implant - no recurrence -   2. Ischemic CM - scar on Myoview but no ischemia. No chest pain.  3. Systolic HF - looks compensated.  His weight is down 3 pounds  4. CAD - no active chest pain  5. HTN -  6. HLD-no longer on niacin.  We are checking fasting labs today   Current medicines are reviewed at length with the patient today.  The patient does not have concerns  regarding medicines.  The following changes have been made:  no change  Labs/ tests ordered today include:   Orders Placed This Encounter  Procedures  . Lipid panel  . Hepatic function panel  . Basic metabolic panel    Disposition: The patient will continue current medication.  He will follow-up with Dr. Grayland Jack as scheduled.  Berna Spare MD 09/18/2015 10:11 AM    [facility] [address], [place], [place]  [address] Phone: [phone]; Fax: [phone]

## 2015-09-18 NOTE — Patient Instructions (Signed)
Medication Instructions:  Your physician recommends that you continue on your current medications as directed. Please refer to the Current Medication list given to you today.  Labwork: Lp/bmet/hfp  Testing/Procedures: none  Follow-Up: Keep your appointment in March   If you need a refill on your cardiac medications before your next appointment, please call your pharmacy.

## 2015-09-19 ENCOUNTER — Other Ambulatory Visit: Payer: Self-pay | Admitting: Cardiology

## 2015-09-19 MED ORDER — OMALIZUMAB 150 MG ~~LOC~~ SOLR
300.0000 mg | Freq: Once | SUBCUTANEOUS | Status: AC
  Administered 2015-09-18: 300 mg via SUBCUTANEOUS

## 2015-09-19 NOTE — Progress Notes (Signed)
Quick Note:  Please report to patient. The recent labs are stable. Continue same medication and careful diet. ______ 

## 2015-09-25 ENCOUNTER — Telehealth: Payer: Self-pay | Admitting: Pulmonary Disease

## 2015-09-26 NOTE — Telephone Encounter (Addendum)
#   vials:4 Ordered date:09/25/15 Shipping Date:09/26/15 Completely forgot to put this in.

## 2015-09-26 NOTE — Telephone Encounter (Signed)
#   Vials:4 Arrival Date:09/26/15 Lot [id number] Exp Date:9/20

## 2015-10-02 ENCOUNTER — Ambulatory Visit (INDEPENDENT_AMBULATORY_CARE_PROVIDER_SITE_OTHER): Payer: Medicare Other

## 2015-10-02 DIAGNOSIS — J4540 Moderate persistent asthma, uncomplicated: Secondary | ICD-10-CM

## 2015-10-02 DIAGNOSIS — E291 Testicular hypofunction: Secondary | ICD-10-CM | POA: Diagnosis not present

## 2015-10-05 MED ORDER — OMALIZUMAB 150 MG ~~LOC~~ SOLR
300.0000 mg | Freq: Once | SUBCUTANEOUS | Status: AC
  Administered 2015-10-02: 300 mg via SUBCUTANEOUS

## 2015-10-12 ENCOUNTER — Ambulatory Visit (INDEPENDENT_AMBULATORY_CARE_PROVIDER_SITE_OTHER): Payer: Medicare Other | Admitting: Cardiovascular Disease

## 2015-10-12 ENCOUNTER — Encounter: Payer: Self-pay | Admitting: Cardiovascular Disease

## 2015-10-12 VITALS — BP 132/80 | HR 68 | Ht 70.5 in | Wt 220.4 lb

## 2015-10-12 DIAGNOSIS — I25810 Atherosclerosis of coronary artery bypass graft(s) without angina pectoris: Secondary | ICD-10-CM | POA: Diagnosis not present

## 2015-10-12 DIAGNOSIS — E7800 Pure hypercholesterolemia, unspecified: Secondary | ICD-10-CM | POA: Diagnosis not present

## 2015-10-12 DIAGNOSIS — I255 Ischemic cardiomyopathy: Secondary | ICD-10-CM | POA: Diagnosis not present

## 2015-10-12 DIAGNOSIS — I5022 Chronic systolic (congestive) heart failure: Secondary | ICD-10-CM | POA: Diagnosis not present

## 2015-10-12 DIAGNOSIS — I25119 Atherosclerotic heart disease of native coronary artery with unspecified angina pectoris: Secondary | ICD-10-CM | POA: Diagnosis not present

## 2015-10-12 NOTE — Patient Instructions (Signed)
Medication Instructions:  None  Labwork: Your physician recommends that you return for lab work in: 6 months when you see Dr. Acie Fredrickson back. (BMET, LFTs, Lipids)   Testing/Procedures: Your physician has requested that you have a carotid duplex. This test is an ultrasound of the carotid arteries in your neck. It looks at blood flow through these arteries that supply the brain with blood. Allow one hour for this exam. There are no restrictions or special instructions.   Follow-Up: Your physician wants you to follow-up in: 6 months with Dr. Acie Fredrickson. You will receive a reminder letter in the mail two months in advance. If you don't receive a letter, please call our office to schedule the follow-up appointment.   Any Other Special Instructions Will Be Listed Below (If Applicable).     If you need a refill on your cardiac medications before your next appointment, please call your pharmacy.

## 2015-10-12 NOTE — Progress Notes (Signed)
Cardiology Office Note   Date:  10/12/2015   ID:  Shane [name], DOB Dec 19, 1933, MRN [id number]  PCP:  Warren Danes, MD  Cardiologist:   Thayer Headings, MD   Chief Complaint  Patient presents with  . Follow-up    CHF   Problem list 1. Coronary artery disease - history of remote coronary artery bypass grafting 2. Chronic systolic congestive heart failure-due to ischemic cardiopathy 3. History of ICD placement - Klein  4. Hyperlipidemia 5. Diabetes mellitus  6. Mild carotid artery disease.   History of Present Illness: Shane [name] is a 82 y.o. male who presents for follow up of his CAD and CHF. Was last seen by Dr. Mare Ferrari several weeks .   No CP , no dyspnea.  Rides an exercise bike.   Has a bad ankle. Has had right knee replacement  - still has trouble with the left knee  Is retired from [employer].   Sold Maalox  Is currently a pastor , reads quite a bit .     Past Medical History  Diagnosis Date  . Coronary artery disease     remote CABG in 1985, cath in 2003 by Dr. Lia Foyer with no PCI, last nuclear in 2010 showing scar and EF of 28%.   . Dyslipidemia   . Hypercholesterolemia   . Left ventricular dysfunction     28% per nuclear in 2010 and 35 to 40% per echo in 2010  . HTN (hypertension)   . Diabetes mellitus   . Cellulitis of arm, left     MSSA  . Spinal stenosis   . BPH (benign prostatic hyperplasia)   . Allergic rhinitis 01-08-13    Uses nebulizer for chronic sinus issues and Mucinex.  . Abnormal nuclear cardiac imaging test Nov 2010    moderate area of infarct in the inferior wall with only minimal reversibility and EF of 28%  . Myocardial infarction ([place])     1985  . Pneumonia     hx of several times years ago   . Blood transfusion     ? at time of bypass surgery   . Arthritis     osteoarthritis   . Colon polyps     adenomatous  . Neuromuscular disorder ([place])     legs mild paralysis-able to walk"nerve damage"-legs- left leg  brace   . CKD (chronic kidney disease), stage III     "was told due to meds he takes"-no Renal workup done  . Glaucoma 01-08-13    tx. eye drops    Past Surgical History  Procedure Laterality Date  . Cardiac catheterization  2003  . Lumbar disc surgery    . Prostate surgery    . Back surgery      hx of back surgery x 4   . Total knee arthroplasty  08/01/2011    Procedure: TOTAL KNEE ARTHROPLASTY;  Surgeon: Johnn Hai;  Location: [facility];  Service: Orthopedics;  Laterality: Right;  . Cataract       recent cataract surgery 6'14  . Coronary artery bypass graft  1985    x 5 vessels  . Colonoscopy N/A 01/25/2013    Procedure: COLONOSCOPY;  Surgeon: Irene Shipper, MD;  Location: [facility];  Service: Endoscopy;  Laterality: N/A;  . Bi-ventricular implantable cardioverter defibrillator N/A 10/10/2014    Procedure: BI-VENTRICULAR IMPLANTABLE CARDIOVERTER DEFIBRILLATOR  (CRT-D);  Surgeon: [name], MD;  Location: [facility] CATH LAB;  Service: Cardiovascular;  Laterality: N/A;  Current Outpatient Prescriptions  Medication Sig Dispense Refill  . acetaminophen (TYLENOL) 500 MG tablet Take 1,000 mg by mouth as needed for pain.     Marland Kitchen amLODipine (NORVASC) 10 MG tablet Take 10 mg by mouth daily.    Marland Kitchen aspirin EC 81 MG tablet Take 81 mg by mouth every morning.    Marland Kitchen atorvastatin (LIPITOR) 10 MG tablet Take 10 mg by mouth daily.    Marland Kitchen b complex vitamins tablet Take 1 tablet by mouth daily.    . carvedilol (COREG) 12.5 MG tablet Take 1.5 tablets (18.75 mg total) by mouth 2 (two) times daily. 270 tablet 3  . Cholecalciferol (VITAMIN D-3) 1000 UNITS CAPS Take 2,000 Units by mouth daily.    Marland Kitchen CIALIS 20 MG tablet Take 20 mg by mouth daily as needed for erectile dysfunction.     . diphenhydrAMINE (BENADRYL) 25 mg capsule Take 25 mg by mouth every 6 (six) hours as needed for allergies.     Marland Kitchen EPINEPHrine (EPIPEN) 0.3 mg/0.3 mL SOAJ injection Inject 0.3 mLs (0.3 mg total) into the muscle once. 2 Device 0    . fluocinonide cream (LIDEX) 9.38 % Apply 1 application topically 2 (two) times daily.    . metFORMIN (GLUCOPHAGE) 500 MG tablet TAKE ONE TABLET BY MOUTH ONCE DAILY 90 tablet 3  . Multiple Vitamin (MULTIVITAMIN) tablet Take 1 tablet by mouth daily.    . naproxen sodium (ANAPROX) 220 MG tablet Take 220 mg by mouth daily as needed (back pain).    Marland Kitchen omalizumab (XOLAIR) 150 MG injection Inject 150 mg into the skin every 14 (fourteen) days.    Marland Kitchen spironolactone (ALDACTONE) 25 MG tablet Take 0.5 tablets (12.5 mg total) by mouth daily. 45 tablet 3  . testosterone cypionate (DEPOTESTOTERONE CYPIONATE) 100 MG/ML injection Inject 400 mg into the muscle every 21 ( twenty-one) days. For IM use only    . triamcinolone (NASACORT AQ) 55 MCG/ACT AERO nasal inhaler Place 2 sprays into the nose daily.    . valsartan-hydrochlorothiazide (DIOVAN-HCT) 320-12.5 MG per tablet Take 1 tablet by mouth daily.    Marland Kitchen zolpidem (AMBIEN) 10 MG tablet Take 1/2 to 1 tablet at bedtime as needed for sleep. Additional refills from PCP 30 tablet 2   No current facility-administered medications for this visit.    Allergies:   Codeine; Hydrocodone; and Azithromycin    Social History:  The patient  reports that he quit smoking about 57 years ago. His smoking use included Cigarettes, Pipe, and Cigars. He has a 5 pack-year smoking history. He has never used smokeless tobacco. He reports that he does not drink alcohol or use illicit drugs.   Family History:  The patient's family history includes Bone cancer in his brother; Colon polyps in his father; Diabetes in his sister; Heart attack in his father; Heart disease in his brother and father; Lung cancer in his mother. There is no history of Lung disease.    ROS:  Please see the history of present illness.    Review of Systems: Constitutional:  denies fever, chills, diaphoresis, appetite change and fatigue.  HEENT: denies photophobia, eye pain, redness, hearing loss, ear pain,  congestion, sore throat, rhinorrhea, sneezing, neck pain, neck stiffness and tinnitus.  Respiratory: denies SOB, DOE, cough, chest tightness, and wheezing.  Cardiovascular: denies chest pain, palpitations and leg swelling.  Gastrointestinal: denies nausea, vomiting, abdominal pain, diarrhea, constipation, blood in stool.  Genitourinary: denies dysuria, urgency, frequency, hematuria, flank pain and difficulty urinating.  Musculoskeletal: denies  myalgias, back pain, joint swelling, arthralgias and gait problem.   Skin: denies pallor, rash and wound.  Neurological: denies dizziness, seizures, syncope, weakness, light-headedness, numbness and headaches.   Hematological: denies adenopathy, easy bruising, personal or family bleeding history.  Psychiatric/ Behavioral: denies suicidal ideation, mood changes, confusion, nervousness, sleep disturbance and agitation.       All other systems are reviewed and negative.    PHYSICAL EXAM: VS:  BP 132/80 mmHg  Pulse 68  Ht 5' 10.5" (1.791 m)  Wt 220 lb 6.4 oz (99.973 kg)  BMI 31.17 kg/m2 , BMI Body mass index is 31.17 kg/(m^2). GEN: Well nourished, well developed, in no acute distress HEENT: normal Neck: no JVD,  Possible soft right carotid bruit, or masses Cardiac: RRR; no murmurs, rubs, or gallops,no edema  Respiratory:  clear to auscultation bilaterally, normal work of breathing GI: soft, nontender, nondistended, + BS MS: no deformity or atrophy Skin: warm and dry, no rash Neuro:  Strength and sensation are intact Psych: normal   EKG:  EKG is not ordered today.    Recent Labs: 09/18/2015: ALT 15; BUN 51*; Creat 2.14*; Potassium 4.6; Sodium 136    Lipid Panel    Component Value Date/Time   CHOL 104* 09/18/2015 0955   TRIG 97 09/18/2015 0955   HDL 25* 09/18/2015 0955   CHOLHDL 4.2 09/18/2015 0955   VLDL 19 09/18/2015 0955   LDLCALC 60 09/18/2015 0955      Wt Readings from Last 3 Encounters:  10/12/15 220 lb 6.4 oz (99.973 kg)   09/18/15 218 lb 12.8 oz (99.247 kg)  07/17/15 221 lb (100.245 kg)      Other studies Reviewed: Additional studies/ records that were reviewed today include: . Review of the above records demonstrates:    ASSESSMENT AND PLAN:  1. Coronary artery disease - history of remote coronary artery bypass grafting 2. Chronic systolic congestive heart failure-due to ischemic cardiopathy - continue current medications. His blood pressure is well-controlled. 3. History of ICD placement - Klein  4. Hyperlipidemia- we'll check fasting labs at his next visit. 5. Diabetes mellitus  6. Mild carotid artery disease.  - He has a very soft bruit. He had a duplex scan in 2007 which revealed mild carotid disease. We'll recheck a duplex scan soon.  Dr. Mare Ferrari has been his general medical doctor for years. Right generally takes Ambien at night. Also needs occasional antibiotics. He'll be working on establishing himself with a primary care doctor soon. We'll continue to keep him covered until then.   Current medicines are reviewed at length with the patient today.  The patient does not have concerns regarding medicines.  The following changes have been made:  no change  Labs/ tests ordered today include:   Orders Placed This Encounter  Procedures  . Comp Met (CMET)  . Lipid Profile     Disposition:   FU with me in 6 months      Cochise Dinneen, Wonda Cheng, MD  10/12/2015 6:19 PM    [facility] [place], [place], [place]  [address] Phone: [phone]; Fax: [phone]   [facility]  [address] [place], [place]  [address] [phone]   Fax [phone]

## 2015-10-16 ENCOUNTER — Ambulatory Visit (INDEPENDENT_AMBULATORY_CARE_PROVIDER_SITE_OTHER): Payer: Medicare Other

## 2015-10-16 DIAGNOSIS — E291 Testicular hypofunction: Secondary | ICD-10-CM | POA: Diagnosis not present

## 2015-10-16 DIAGNOSIS — J4540 Moderate persistent asthma, uncomplicated: Secondary | ICD-10-CM

## 2015-10-17 ENCOUNTER — Ambulatory Visit (HOSPITAL_COMMUNITY)
Admission: RE | Admit: 2015-10-17 | Discharge: 2015-10-17 | Disposition: A | Discharge status: Home / Self Care | Payer: Medicare Other | Source: Ambulatory Visit | Attending: Cardiovascular Disease | Admitting: Cardiovascular Disease

## 2015-10-17 ENCOUNTER — Encounter (HOSPITAL_COMMUNITY): Payer: Medicare Other

## 2015-10-17 DIAGNOSIS — I129 Hypertensive chronic kidney disease with stage 1 through stage 4 chronic kidney disease, or unspecified chronic kidney disease: Secondary | ICD-10-CM | POA: Diagnosis not present

## 2015-10-17 DIAGNOSIS — I25810 Atherosclerosis of coronary artery bypass graft(s) without angina pectoris: Secondary | ICD-10-CM | POA: Diagnosis not present

## 2015-10-17 DIAGNOSIS — E1122 Type 2 diabetes mellitus with diabetic chronic kidney disease: Secondary | ICD-10-CM | POA: Diagnosis not present

## 2015-10-17 DIAGNOSIS — E7800 Pure hypercholesterolemia, unspecified: Secondary | ICD-10-CM | POA: Diagnosis not present

## 2015-10-17 DIAGNOSIS — R0989 Other specified symptoms and signs involving the circulatory and respiratory systems: Secondary | ICD-10-CM | POA: Diagnosis not present

## 2015-10-17 DIAGNOSIS — N183 Chronic kidney disease, stage 3 (moderate): Secondary | ICD-10-CM | POA: Diagnosis not present

## 2015-10-17 DIAGNOSIS — I6523 Occlusion and stenosis of bilateral carotid arteries: Secondary | ICD-10-CM | POA: Diagnosis not present

## 2015-10-17 DIAGNOSIS — I5022 Chronic systolic (congestive) heart failure: Secondary | ICD-10-CM | POA: Diagnosis not present

## 2015-10-17 MED ORDER — OMALIZUMAB 150 MG ~~LOC~~ SOLR
300.0000 mg | Freq: Once | SUBCUTANEOUS | Status: AC
  Administered 2015-10-16: 300 mg via SUBCUTANEOUS

## 2015-10-19 ENCOUNTER — Encounter: Payer: Self-pay | Admitting: Internal Medicine

## 2015-10-19 ENCOUNTER — Ambulatory Visit (INDEPENDENT_AMBULATORY_CARE_PROVIDER_SITE_OTHER): Payer: Medicare Other | Admitting: Internal Medicine

## 2015-10-19 VITALS — BP 150/88 | HR 70 | Ht 70.5 in | Wt 220.6 lb

## 2015-10-19 DIAGNOSIS — G4730 Sleep apnea, unspecified: Secondary | ICD-10-CM

## 2015-10-19 DIAGNOSIS — Z4502 Encounter for adjustment and management of automatic implantable cardiac defibrillator: Secondary | ICD-10-CM

## 2015-10-19 DIAGNOSIS — I472 Ventricular tachycardia, unspecified: Secondary | ICD-10-CM

## 2015-10-19 DIAGNOSIS — G478 Other sleep disorders: Secondary | ICD-10-CM | POA: Diagnosis not present

## 2015-10-19 DIAGNOSIS — I5022 Chronic systolic (congestive) heart failure: Secondary | ICD-10-CM

## 2015-10-19 DIAGNOSIS — I255 Ischemic cardiomyopathy: Secondary | ICD-10-CM

## 2015-10-19 MED ORDER — CARVEDILOL 25 MG PO TABS: 25.0000 mg | ORAL_TABLET | Freq: Two times a day (BID) | ORAL | Status: DC

## 2015-10-19 MED ORDER — FUROSEMIDE 40 MG PO TABS: 40.0000 mg | ORAL_TABLET | ORAL | Status: DC

## 2015-10-19 MED ORDER — SACUBITRIL-VALSARTAN 49-51 MG PO TABS: 1.0000 | ORAL_TABLET | Freq: Two times a day (BID) | ORAL | Status: DC

## 2015-10-19 NOTE — Progress Notes (Signed)
Patient Care Team: [name], MD as PCP - General (Internal Medicine) Elsie Stain, MD as Consulting Physician (Pulmonary Disease)   HPI  Shane [name] is a 82 y.o. male Seen in follow-up for CRT-D implanted 3/16.  He has a history of ischemic heart disease with remote bypass surgery  He presented 3/16 with ventricular tachycardia    Echo >> EF 35-40%, Akinesis and scarring of the basal-midinferolateral, inferior, and inferoseptal myocardium. PA pressure 33mmHg. LA moderately dilated.  Myoview>> . No reversible ischemia. Large fixed inferior an inferoapical wall defect, consistent with myocardial infarction. Severe inferior wall hypokinesis and left ventricular dilatation. Left ventricular ejection fraction 22%   He underwent CRT-D implantation in the context of left bundle branch block  He was also noted in hospital to have frequent ventricular ectopy.  He has renal insufficiency date  Cr  3/16 1.97  7/16 2.07  2/17 2.14   He has noted if anything some worsening in energy level following CRT implantation. At that hospitalization carvedilol was changed for metoprolol.  He does snore. He has daytime somnolence. He has some peripheral edema.\  But overall, he is doing pretty well.    Past Medical History  Diagnosis Date  . Coronary artery disease     remote CABG in 1985, cath in 2003 by Dr. Lia Foyer with no PCI, last nuclear in 2010 showing scar and EF of 28%.   . Dyslipidemia   . Hypercholesterolemia   . Left ventricular dysfunction     28% per nuclear in 2010 and 35 to 40% per echo in 2010  . HTN (hypertension)   . Diabetes mellitus   . Cellulitis of arm, left     MSSA  . Spinal stenosis   . BPH (benign prostatic hyperplasia)   . Allergic rhinitis 01-08-13    Uses nebulizer for chronic sinus issues and Mucinex.  . Abnormal nuclear cardiac imaging test Nov 2010    moderate area of infarct in the inferior wall with only minimal reversibility and EF  of 28%  . Myocardial infarction ([place])     1985  . Pneumonia     hx of several times years ago   . Blood transfusion     ? at time of bypass surgery   . Arthritis     osteoarthritis   . Colon polyps     adenomatous  . Neuromuscular disorder ([place])     legs mild paralysis-able to walk"nerve damage"-legs- left leg brace   . CKD (chronic kidney disease), stage III     "was told due to meds he takes"-no Renal workup done  . Glaucoma 01-08-13    tx. eye drops    Past Surgical History  Procedure Laterality Date  . Cardiac catheterization  2003  . Lumbar disc surgery    . Prostate surgery    . Back surgery      hx of back surgery x 4   . Total knee arthroplasty  08/01/2011    Procedure: TOTAL KNEE ARTHROPLASTY;  Surgeon: Johnn Hai;  Location: [facility];  Service: Orthopedics;  Laterality: Right;  . Cataract       recent cataract surgery 6'14  . Coronary artery bypass graft  1985    x 5 vessels  . Colonoscopy N/A 01/25/2013    Procedure: COLONOSCOPY;  Surgeon: Irene Shipper, MD;  Location: [facility];  Service: Endoscopy;  Laterality: N/A;  . Bi-ventricular implantable cardioverter defibrillator N/A 10/10/2014  Procedure: BI-VENTRICULAR IMPLANTABLE CARDIOVERTER DEFIBRILLATOR  (CRT-D);  Surgeon: [name], MD;  Location: [facility] CATH LAB;  Service: Cardiovascular;  Laterality: N/A;    Current Outpatient Prescriptions  Medication Sig Dispense Refill  . acetaminophen (TYLENOL) 500 MG tablet Take 1,000 mg by mouth as needed for pain.     Marland Kitchen amLODipine (NORVASC) 10 MG tablet Take 10 mg by mouth daily.    Marland Kitchen aspirin EC 81 MG tablet Take 81 mg by mouth every morning.    Marland Kitchen atorvastatin (LIPITOR) 10 MG tablet Take 10 mg by mouth daily.    Marland Kitchen b complex vitamins tablet Take 1 tablet by mouth daily.    . carvedilol (COREG) 12.5 MG tablet Take 1.5 tablets (18.75 mg total) by mouth 2 (two) times daily. 270 tablet 3  . Cholecalciferol (VITAMIN D-3) 1000 UNITS CAPS Take 2,000 Units by mouth daily.     Marland Kitchen CIALIS 20 MG tablet Take 20 mg by mouth daily as needed for erectile dysfunction.     . diphenhydrAMINE (BENADRYL) 25 mg capsule Take 25 mg by mouth every 6 (six) hours as needed for allergies.     Marland Kitchen EPINEPHrine (EPIPEN) 0.3 mg/0.3 mL SOAJ injection Inject 0.3 mLs (0.3 mg total) into the muscle once. 2 Device 0  . fluocinonide cream (LIDEX) [id number] % Apply 1 application topically 2 (two) times daily.    . metFORMIN (GLUCOPHAGE) 500 MG tablet TAKE ONE TABLET BY MOUTH ONCE DAILY 90 tablet 3  . Multiple Vitamin (MULTIVITAMIN) tablet Take 1 tablet by mouth daily.    . naproxen sodium (ANAPROX) 220 MG tablet Take 220 mg by mouth daily as needed (back pain).    Marland Kitchen omalizumab (XOLAIR) 150 MG injection Inject 150 mg into the skin every 14 (fourteen) days.    Marland Kitchen spironolactone (ALDACTONE) 25 MG tablet Take 0.5 tablets (12.5 mg total) by mouth daily. 45 tablet 3  . testosterone cypionate (DEPOTESTOTERONE CYPIONATE) 100 MG/ML injection Inject 400 mg into the muscle every 21 ( twenty-one) days. For IM use only    . triamcinolone (NASACORT AQ) 55 MCG/ACT AERO nasal inhaler Place 2 sprays into the nose daily.    . valsartan-hydrochlorothiazide (DIOVAN-HCT) 320-12.5 MG per tablet Take 1 tablet by mouth daily.    Marland Kitchen zolpidem (AMBIEN) 10 MG tablet Take 1/2 to 1 tablet at bedtime as needed for sleep. Additional refills from PCP 30 tablet 2   No current facility-administered medications for this visit.    Allergies  Allergen Reactions  . Codeine     anxiety  . Hydrocodone     anxiety  . Azithromycin Rash    Review of Systems negative except from HPI and PMH  Physical Exam BP 150/88 mmHg  Pulse 70  Ht 5' 10.5" (1.791 m)  Wt 220 lb 9.6 oz (100.064 kg)  BMI 31.20 kg/m2 Well developed and well nourished in no acute distress HENT normal E scleral and icterus clear Neck Supple JVP flat; carotids brisk and full Device pocket well healed; without hematoma or erythema.  There is no tethering  Clear to  ausculation regular rate and rhythm, no murmurs gallops or rub Soft with active bowel sounds No clubbing cyanosis 2+>1+ Edema Alert and oriented, grossly normal motor and sensory function Skin Warm and Dry  ECG was ordered today demonstrated sinus rhythm at 69 with P-synchronous/ AV  pacing  Assessment and  Plan  Ischemic cardiomyopathy  Congestive heart failure-chronic-systolic  Left Bundle-branch block  CRT-D-Medtronic  The patient's device was interrogated and the  information was fully reviewed.  The device was reprogrammed to improve RV pacing outputs albeit he is in adaptive pacing mode  Hypertension   Obstructive sleep apnea/sleep disorder breathing  Renal insufficiency grade 3  Cr 2.14  2/17    CrCL 38   Will change meds with some trepidation given borderline accepatable renal fucntion  Change to entresto and will have him return in a couple of weeks for up titration  He has significant edema so we will put him on a diuretic Lasix 40 mg every other day given his renal issues  We'll increase his carvedilol from 18.75--25 as he is hypertensive  He also needs a sleep study. He has significant sleep disordered breathing and daytime somnolence

## 2015-10-19 NOTE — Patient Instructions (Addendum)
Medication Instructions:  Your physician has recommended you make the following change in your medication:  1) STOP Diovan-HCT 2) START Entresto 49/51 mg twice a day 3) START Furosemide 40 mg every other day 4) IN 2 WEEKS --  INCREASE Carvedilol to 25 mg twice a day  If you need a refill on your cardiac medications before your next appointment, please call your pharmacy.  Labwork: None ordered  Testing/Procedures: Your physician has recommended that you have a sleep study. This test records several body functions during sleep, including: brain activity, eye movement, oxygen and carbon dioxide blood levels, heart rate and rhythm, breathing rate and rhythm, the flow of air through your mouth and nose, snoring, body muscle movements, and chest and belly movement.  Follow-Up: Your physician recommends that you schedule a follow-up appointment in: 2 weeks with PA/NP.  Remote monitoring is used to monitor your Pacemaker of ICD from home. This monitoring reduces the number of office visits required to check your device to one time per year. It allows [place] to keep an eye on the functioning of your device to ensure it is working properly. You are scheduled for a device check from home on 01/18/16. You may send your transmission at any time that day. If you have a wireless device, the transmission will be sent automatically. After your physician reviews your transmission, you will receive a postcard with your next transmission date.  Your physician wants you to follow-up in: 1 year with Dr. Caryl Comes.  You will receive a reminder letter in the mail two months in advance. If you don't receive a letter, please call our office to schedule the follow-up appointment.   Thank you for choosing [facility]!!     Any Other Special Instructions Will Be Listed Below (If Applicable).

## 2015-10-25 LAB — CUP PACEART INCLINIC DEVICE CHECK
Battery Remaining Longevity: 103 mo
Battery Voltage: 3 V
Brady Statistic AP VP Percent: 1.07 %
Brady Statistic AS VP Percent: 96.83 %
Brady Statistic RA Percent Paced: 1.11 %
HighPow Impedance: 68 Ohm
Implantable Lead Implant Date: 20160308
Implantable Lead Implant Date: 20160308
Implantable Lead Model: 4598
Implantable Lead Model: 6935
Lead Channel Impedance Value: 380 Ohm
Lead Channel Impedance Value: 399 Ohm
Lead Channel Impedance Value: 399 Ohm
Lead Channel Impedance Value: 570 Ohm
Lead Channel Impedance Value: 722 Ohm
Lead Channel Impedance Value: 722 Ohm
Lead Channel Pacing Threshold Amplitude: 2.5 V
Lead Channel Pacing Threshold Pulse Width: 0.8 ms
Lead Channel Sensing Intrinsic Amplitude: 3 mV
Lead Channel Setting Pacing Amplitude: 1.75 V
Lead Channel Setting Pacing Pulse Width: 0.6 ms
MDC IDC LEAD IMPLANT DT: 20160308
MDC IDC LEAD LOCATION: 753858
MDC IDC LEAD LOCATION: 753859
MDC IDC LEAD LOCATION: 753860
MDC IDC MSMT LEADCHNL LV IMPEDANCE VALUE: 323 Ohm
MDC IDC MSMT LEADCHNL LV IMPEDANCE VALUE: 456 Ohm
MDC IDC MSMT LEADCHNL LV IMPEDANCE VALUE: 494 Ohm
MDC IDC MSMT LEADCHNL LV IMPEDANCE VALUE: 608 Ohm
MDC IDC MSMT LEADCHNL LV IMPEDANCE VALUE: 703 Ohm
MDC IDC MSMT LEADCHNL LV PACING THRESHOLD AMPLITUDE: 0.75 V
MDC IDC MSMT LEADCHNL LV PACING THRESHOLD PULSEWIDTH: 0.6 ms
MDC IDC MSMT LEADCHNL RA PACING THRESHOLD AMPLITUDE: 0.625 V
MDC IDC MSMT LEADCHNL RA PACING THRESHOLD PULSEWIDTH: 0.4 ms
MDC IDC MSMT LEADCHNL RV IMPEDANCE VALUE: 494 Ohm
MDC IDC MSMT LEADCHNL RV IMPEDANCE VALUE: 551 Ohm
MDC IDC MSMT LEADCHNL RV SENSING INTR AMPL: 14 mV
MDC IDC SESS DTM: 20170316174817
MDC IDC SET LEADCHNL RA PACING AMPLITUDE: 2 V
MDC IDC SET LEADCHNL RV SENSING SENSITIVITY: 0.3 mV
MDC IDC STAT BRADY AP VS PERCENT: 0.04 %
MDC IDC STAT BRADY AS VS PERCENT: 2.06 %
MDC IDC STAT BRADY RV PERCENT PACED: 0.16 %

## 2015-10-26 ENCOUNTER — Telehealth: Payer: Self-pay | Admitting: Internal Medicine

## 2015-10-26 ENCOUNTER — Telehealth: Payer: Self-pay | Admitting: Pulmonary Disease

## 2015-10-26 NOTE — Telephone Encounter (Signed)
#   vials:4 Ordered date:10/26/15 Shipping Date:3/24/ or 10/30/15

## 2015-10-26 NOTE — Telephone Encounter (Signed)
Spoke to patient regarding sleep study procedure.  He is understanding and was appreciative of the call.

## 2015-10-26 NOTE — Telephone Encounter (Signed)
New Message  Pt was not aware of sleep study sched for 5/4. Please call back and discuss.

## 2015-10-27 NOTE — Telephone Encounter (Signed)
#   Vials:4 Arrival Date:10/27/2015  Lot [id number] Exp Date:06/2019

## 2015-10-30 ENCOUNTER — Ambulatory Visit (INDEPENDENT_AMBULATORY_CARE_PROVIDER_SITE_OTHER): Payer: Medicare Other

## 2015-10-30 DIAGNOSIS — J4540 Moderate persistent asthma, uncomplicated: Secondary | ICD-10-CM

## 2015-11-01 MED ORDER — OMALIZUMAB 150 MG ~~LOC~~ SOLR
300.0000 mg | Freq: Once | SUBCUTANEOUS | Status: AC
  Administered 2015-10-30: 300 mg via SUBCUTANEOUS

## 2015-11-02 ENCOUNTER — Encounter: Payer: Self-pay | Admitting: Cardiology

## 2015-11-02 ENCOUNTER — Telehealth: Payer: Self-pay

## 2015-11-02 ENCOUNTER — Ambulatory Visit (INDEPENDENT_AMBULATORY_CARE_PROVIDER_SITE_OTHER): Payer: Medicare Other | Admitting: Cardiology

## 2015-11-02 VITALS — BP 138/74 | HR 62 | Ht 70.0 in | Wt 213.1 lb

## 2015-11-02 DIAGNOSIS — I255 Ischemic cardiomyopathy: Secondary | ICD-10-CM | POA: Diagnosis not present

## 2015-11-02 DIAGNOSIS — I5022 Chronic systolic (congestive) heart failure: Secondary | ICD-10-CM | POA: Diagnosis not present

## 2015-11-02 DIAGNOSIS — E875 Hyperkalemia: Secondary | ICD-10-CM

## 2015-11-02 LAB — BASIC METABOLIC PANEL
BUN: 44 mg/dL — ABNORMAL HIGH (ref 7–25)
CO2: 25 mmol/L (ref 20–31)
Calcium: 9.2 mg/dL (ref 8.6–10.3)
Chloride: 103 mmol/L (ref 98–110)
Creat: 2.29 mg/dL — ABNORMAL HIGH (ref 0.70–1.11)
GLUCOSE: 104 mg/dL — AB (ref 65–99)
POTASSIUM: 5.8 mmol/L — AB (ref 3.5–5.3)
SODIUM: 137 mmol/L (ref 135–146)

## 2015-11-02 NOTE — Progress Notes (Signed)
11/02/2015 Shane [name]   1934/04/03  [id number]  Primary Physician [name], MD Primary Cardiologist:  Dr. Acie Fredrickson Electrophysiologist: Dr. Caryl Comes  Reason for Visit/CC: Medication monitoring for HF  HPI:  82 y/o male who presents to clinic today for medication monitoring/ titration for heart failure. He has a history of ischemic heart disease with remote bypass surgery. He presented 10/2014 with ventricular tachycardia. Echo >> EF 35-40%, Akinesis and scarring of the basal-midinferolateral, inferior, and inferoseptal myocardium. PA pressure 60mmHg. LA moderately dilated. Myoview>> . No reversible ischemia. Large fixed inferior an inferoapical wall defect, consistent with myocardial infarction. Severe inferior wall hypokinesis and left ventricular dilatation. Left ventricular ejection fraction 22%  He underwent CRT-D implantation in the context of left bundle branch block. This is followed by Dr. Caryl Comes. He was recently seen 2 weeks ago by Dr. Caryl Comes. His device was interrogated and functioning properly. He was noted to have significant edema, thus he placed him on a diuretic, lasix 40 mg every other day given renal function. Other medication adjustments were made. He increased his Coreg to 25 mg BID and started [facility] 49/51.  He presents to clinic today for f/u. He has tolerated med adjustments well w/o side effects, however he reports that he did not increase his Coreg to 25 mg as directed. Thus he has remained on the lower dose. His BP today is 138/74. He notes the lasix resolved his LEE and his breathing has improved. He denies dyspnea, weight gain and CP.    Current Outpatient Prescriptions  Medication Sig Dispense Refill  . acetaminophen (TYLENOL) 500 MG tablet Take 1,000 mg by mouth as needed for pain.     Marland Kitchen amLODipine (NORVASC) 10 MG tablet Take 10 mg by mouth daily.    Marland Kitchen aspirin EC 81 MG tablet Take 81 mg by mouth every morning.    Marland Kitchen atorvastatin (LIPITOR) 10 MG tablet Take  10 mg by mouth daily.    Marland Kitchen b complex vitamins tablet Take 1 tablet by mouth daily.    . carvedilol (COREG) 25 MG tablet Take 1 tablet (25 mg total) by mouth 2 (two) times daily. 60 tablet 11  . Cholecalciferol (VITAMIN D-3) 1000 UNITS CAPS Take 2,000 Units by mouth daily.    Marland Kitchen CIALIS 20 MG tablet Take 20 mg by mouth daily as needed for erectile dysfunction.     . diphenhydrAMINE (BENADRYL) 25 mg capsule Take 25 mg by mouth every 6 (six) hours as needed for allergies.     Marland Kitchen EPINEPHrine (EPIPEN) 0.3 mg/0.3 mL SOAJ injection Inject 0.3 mLs (0.3 mg total) into the muscle once. 2 Device 0  . fluocinonide cream (LIDEX) [id number] % Apply 1 application topically 2 (two) times daily.    . furosemide (LASIX) 40 MG tablet Take 1 tablet (40 mg total) by mouth every other day. 15 tablet 11  . metFORMIN (GLUCOPHAGE) 500 MG tablet TAKE ONE TABLET BY MOUTH ONCE DAILY 90 tablet 3  . Multiple Vitamin (MULTIVITAMIN) tablet Take 1 tablet by mouth daily.    . naproxen sodium (ANAPROX) 220 MG tablet Take 220 mg by mouth daily as needed (back pain).    Marland Kitchen omalizumab (XOLAIR) 150 MG injection Inject 150 mg into the skin every 14 (fourteen) days.    . sacubitril-valsartan (ENTRESTO) 49-51 MG Take 1 tablet by mouth 2 (two) times daily. 60 tablet 11  . spironolactone (ALDACTONE) 25 MG tablet Take 0.5 tablets (12.5 mg total) by mouth daily. 45 tablet 3  . testosterone  cypionate (DEPOTESTOTERONE CYPIONATE) 100 MG/ML injection Inject 400 mg into the muscle every 21 ( twenty-one) days. For IM use only    . triamcinolone (NASACORT AQ) 55 MCG/ACT AERO nasal inhaler Place 2 sprays into the nose daily.    Marland Kitchen zolpidem (AMBIEN) 10 MG tablet Take 1/2 to 1 tablet at bedtime as needed for sleep. Additional refills from PCP 30 tablet 2   No current facility-administered medications for this visit.    Allergies  Allergen Reactions  . Codeine Other (See Comments)    anxiety  . Hydrocodone Other (See Comments)    anxiety  . Azithromycin  Rash    Social History   Social History  . Marital Status: Married    Spouse Name: N/A  . Number of Children: 2  . Years of Education: N/A   Occupational History  . pastor    Social History Main Topics  . Smoking status: Former Smoker -- 1.00 packs/day for 5 years    Types: Cigarettes, Pipe, Cigars    Quit date: 08/05/1958  . Smokeless tobacco: Never Used  . Alcohol Use: No  . Drug Use: No  . Sexual Activity: Not Currently   Other Topics Concern  . Not on file   Social History Narrative   Originally from [place]. He has always lived in [place]. Prior travel to [place], [place], [place], [place] ,[place], & [place]. 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 Has adopted children. Has a dog currently. No bird exposure. No mold exposure. Enjoys reading & traveling.     Review of Systems: General: negative for chills, fever, night sweats or weight changes.  Cardiovascular: negative for chest pain, dyspnea on exertion, edema, orthopnea, palpitations, paroxysmal nocturnal dyspnea or shortness of breath Dermatological: negative for rash Respiratory: negative for cough or wheezing Urologic: negative for hematuria Abdominal: negative for nausea, vomiting, diarrhea, bright red blood per rectum, melena, or hematemesis Neurologic: negative for visual changes, syncope, or dizziness All other systems reviewed and are otherwise negative except as noted above.    Blood pressure 138/74, pulse 62, height 5\' 10"  (1.778 m), weight 213 lb 1.9 oz (96.671 kg), SpO2 96 %.  General appearance: alert, cooperative and no distress Neck: no carotid bruit and no JVD Lungs: clear to auscultation bilaterally Heart: regular rate and rhythm, S1, S2 normal, no murmur, click, rub or gallop Extremities: no LEE Pulses: 2+ and symmetric Skin: warm and dry Neurologic: Grossly normal  EKG not performed  ASSESSMENT AND PLAN:   1. Ischemic Cardiomyopathy/ Chronic Stable Systolic HF: stable w/o  dyspnea. No LEE. Continue lasix + low sodium diet and daily weights. Continue BB therapy with Coreg. Increase dose to 25 mg BID today as previously directed. Given we don't know how he will respond to the higher dose of Coreg, we will delay upward titration of Entresto for now, to avoid hypotension. We will also need to obtain a f/u BMP to ensure renal function and K is stable. F/u in 1-2 weeks with pharmacy for repeat BP check. Further increase Entresto to 97/103 if BP can tolerate.     Lyda Jester PA-C 11/02/2015 11:44 AM

## 2015-11-02 NOTE — Telephone Encounter (Signed)
-----   Message from [place], [place] sent at 11/02/2015  4:27 PM EDT ----- His potassium is high and renal function is a bit worse. I've dicussed with pharmacy. He will need to discontinue Spironolactone. Hold night dose of Entresto. Return Monday for repeat BMP.

## 2015-11-02 NOTE — Telephone Encounter (Signed)
Pt is calling you back 

## 2015-11-02 NOTE — Telephone Encounter (Signed)
Pt aware of results and B.Simmons,PA recommendations. His potassium is high and renal function is a bit worse. I've dicussed with pharmacy. He will need to discontinue Spironolactone. Hold night dose of Entresto. Return Monday for repeat BMP Pt med list updated. Lab appt scheduled for 11/06/15. Pt verbalized understanding to instruction given and voiced appreciation for the call.

## 2015-11-02 NOTE — Telephone Encounter (Signed)
-----   Message from [place], [place] sent at 11/02/2015  4:27 PM EDT ----- His potassium is high and renal function is a bit worse. I've dicussed with pharmacy. He will need to discontinue Spironolactone. Hold night dose of Entresto. Return Monday for repeat BMP.

## 2015-11-02 NOTE — Patient Instructions (Addendum)
Medication Instructions:  Your physician recommends that you continue on your current medications as directed. Please refer to the Current Medication list given to you today.  INCREASE Carvedilol to 25mg  twice daily as previously instructed by Dr.Klein   Labwork: Bmet today  Testing/Procedures: None ordered  Follow-Up: Your physician recommends that you schedule a follow-up appointment in: 2 weeks with our Pharmacist for a BP check and medication titration      Any Other Special Instructions Will Be Listed Below (If Applicable).     If you need a refill on your cardiac medications before your next appointment, please call your pharmacy.

## 2015-11-02 NOTE — Telephone Encounter (Signed)
Called to give pt lab results and Brittainy Simmons,PA recommendations. lmtcb x2 on pt tel # and pt's wife's tel #

## 2015-11-06 ENCOUNTER — Other Ambulatory Visit (INDEPENDENT_AMBULATORY_CARE_PROVIDER_SITE_OTHER): Payer: Medicare Other | Admitting: *Deleted

## 2015-11-06 DIAGNOSIS — Z0100 Encounter for examination of eyes and vision without abnormal findings: Secondary | ICD-10-CM | POA: Diagnosis not present

## 2015-11-06 DIAGNOSIS — E291 Testicular hypofunction: Secondary | ICD-10-CM | POA: Diagnosis not present

## 2015-11-06 DIAGNOSIS — H40011 Open angle with borderline findings, low risk, right eye: Secondary | ICD-10-CM | POA: Diagnosis not present

## 2015-11-06 DIAGNOSIS — E875 Hyperkalemia: Secondary | ICD-10-CM | POA: Diagnosis not present

## 2015-11-06 DIAGNOSIS — D3131 Benign neoplasm of right choroid: Secondary | ICD-10-CM | POA: Diagnosis not present

## 2015-11-06 DIAGNOSIS — H40012 Open angle with borderline findings, low risk, left eye: Secondary | ICD-10-CM | POA: Diagnosis not present

## 2015-11-06 LAB — BASIC METABOLIC PANEL
BUN: 53 mg/dL — AB (ref 7–25)
CALCIUM: 8.9 mg/dL (ref 8.6–10.3)
CO2: 24 mmol/L (ref 20–31)
CREATININE: 2.21 mg/dL — AB (ref 0.70–1.11)
Chloride: 104 mmol/L (ref 98–110)
GLUCOSE: 88 mg/dL (ref 65–99)
Potassium: 5 mmol/L (ref 3.5–5.3)
Sodium: 138 mmol/L (ref 135–146)

## 2015-11-06 LAB — HM DIABETES EYE EXAM

## 2015-11-07 ENCOUNTER — Telehealth: Payer: Self-pay | Admitting: *Deleted

## 2015-11-07 DIAGNOSIS — I255 Ischemic cardiomyopathy: Secondary | ICD-10-CM

## 2015-11-07 NOTE — Telephone Encounter (Signed)
Pt has been made aware of his lab results and to repeat bmp at his f/u app with Gay Filler, Pharmacist. Pt will repeat bmp at that time. Pt verbalized understanding. Orders put in epic.

## 2015-11-07 NOTE — Telephone Encounter (Signed)
-----   Message from [place], [place] sent at 11/07/2015  3:10 PM EDT ----- Potassium level has returned to normal. Renal function still about the same but not any worse from 5 days ago. Continue to stay off of spironolactone. Keep f/u with pharmacist next week. He will need f/u BMP at that time. Will need to consider stopping Entreso if any worse.

## 2015-11-11 ENCOUNTER — Other Ambulatory Visit: Payer: Self-pay | Admitting: Cardiology

## 2015-11-13 ENCOUNTER — Ambulatory Visit (INDEPENDENT_AMBULATORY_CARE_PROVIDER_SITE_OTHER): Payer: Medicare Other

## 2015-11-13 DIAGNOSIS — J4540 Moderate persistent asthma, uncomplicated: Secondary | ICD-10-CM | POA: Diagnosis not present

## 2015-11-14 ENCOUNTER — Telehealth: Payer: Self-pay | Admitting: Internal Medicine

## 2015-11-14 DIAGNOSIS — R0602 Shortness of breath: Secondary | ICD-10-CM

## 2015-11-14 MED ORDER — OMALIZUMAB 150 MG ~~LOC~~ SOLR
300.0000 mg | Freq: Once | SUBCUTANEOUS | Status: AC
  Administered 2015-11-13: 300 mg via SUBCUTANEOUS

## 2015-11-14 NOTE — Telephone Encounter (Signed)
Will increase diuretics after blood work on Thursday  I would like to repeat his CXR for LV lead placement (2V)  Also would like to reprogram his ICD with echo and ECG (AV  Optimization)

## 2015-11-14 NOTE — Telephone Encounter (Signed)
Increase lasix to 80 qd

## 2015-11-14 NOTE — Telephone Encounter (Signed)
I left a message for the patient to call. 

## 2015-11-14 NOTE — Telephone Encounter (Signed)
New message     Pt c/o medication issue:  1. Name of Medication: lasix 2. How are you currently taking this medication (dosage and times per day)? 40mg  3. Are you having a reaction (difficulty breathing--STAT)? no 4. What is your medication issue? Pt states he has gained 2 lbs this week.  He does not think his lasix is working

## 2015-11-14 NOTE — Telephone Encounter (Addendum)
I called and spoke with the patient. He was started on lasix 40 mg once every other day on 3/16 per Dr. Caryl Comes. He states that over the last week his weight has trended from 209 lbs to 215 lbs. He reports that his baseline weight had been 216 lbs. His spironolactone was d/c'ed on 3/30 due to a bump in his creatinine and K+ -5.8. He does have lower extremity edema throughout the day, but this improves over night.  He is noticing a slight increase in SOB. The patient is due to come back on 11/16/15 for a BP check with the [facility]. I advised the patient I will review with Dr. Caryl Comes and call him back today.

## 2015-11-15 NOTE — Telephone Encounter (Signed)
I spoke with the pt and made him aware of Dr Olin Pia recommendations. The pt will hold off on increasing lasix until after his HTN clinic appointment on 11/16/15 (plan to draw BMP). I have placed order for the pt to have 2 view chest x-ray performed tomorrow.  There was a cancellation for an echo tomorrow morning and I have placed the pt in this slot.  I made him aware that I will have to speak with the device team in the morning to see if they are available for echo tomorrow. The pt agreed with plan.

## 2015-11-16 ENCOUNTER — Ambulatory Visit (INDEPENDENT_AMBULATORY_CARE_PROVIDER_SITE_OTHER): Payer: Medicare Other | Admitting: Pharmacist

## 2015-11-16 ENCOUNTER — Encounter: Payer: Self-pay | Admitting: Pharmacist

## 2015-11-16 ENCOUNTER — Encounter: Payer: Self-pay | Admitting: Internal Medicine

## 2015-11-16 ENCOUNTER — Ambulatory Visit (HOSPITAL_COMMUNITY): Payer: Medicare Other | Attending: Cardiovascular Disease

## 2015-11-16 ENCOUNTER — Other Ambulatory Visit: Payer: Self-pay

## 2015-11-16 ENCOUNTER — Ambulatory Visit
Admission: RE | Admit: 2015-11-16 | Discharge: 2015-11-16 | Disposition: A | Discharge status: Home / Self Care | Payer: Medicare Other | Source: Ambulatory Visit | Attending: Internal Medicine | Admitting: Internal Medicine

## 2015-11-16 ENCOUNTER — Other Ambulatory Visit (INDEPENDENT_AMBULATORY_CARE_PROVIDER_SITE_OTHER): Payer: Medicare Other | Admitting: *Deleted

## 2015-11-16 VITALS — BP 148/70 | HR 65 | Wt 224.0 lb

## 2015-11-16 DIAGNOSIS — E119 Type 2 diabetes mellitus without complications: Secondary | ICD-10-CM | POA: Diagnosis not present

## 2015-11-16 DIAGNOSIS — R29898 Other symptoms and signs involving the musculoskeletal system: Secondary | ICD-10-CM | POA: Insufficient documentation

## 2015-11-16 DIAGNOSIS — I10 Essential (primary) hypertension: Secondary | ICD-10-CM | POA: Diagnosis not present

## 2015-11-16 DIAGNOSIS — I255 Ischemic cardiomyopathy: Secondary | ICD-10-CM | POA: Diagnosis not present

## 2015-11-16 DIAGNOSIS — E785 Hyperlipidemia, unspecified: Secondary | ICD-10-CM | POA: Insufficient documentation

## 2015-11-16 DIAGNOSIS — I340 Nonrheumatic mitral (valve) insufficiency: Secondary | ICD-10-CM | POA: Diagnosis not present

## 2015-11-16 DIAGNOSIS — R0602 Shortness of breath: Secondary | ICD-10-CM | POA: Insufficient documentation

## 2015-11-16 DIAGNOSIS — I447 Left bundle-branch block, unspecified: Secondary | ICD-10-CM | POA: Insufficient documentation

## 2015-11-16 DIAGNOSIS — I350 Nonrheumatic aortic (valve) stenosis: Secondary | ICD-10-CM | POA: Diagnosis not present

## 2015-11-16 DIAGNOSIS — I509 Heart failure, unspecified: Secondary | ICD-10-CM | POA: Insufficient documentation

## 2015-11-16 DIAGNOSIS — I2510 Atherosclerotic heart disease of native coronary artery without angina pectoris: Secondary | ICD-10-CM | POA: Insufficient documentation

## 2015-11-16 DIAGNOSIS — Z87891 Personal history of nicotine dependence: Secondary | ICD-10-CM | POA: Diagnosis not present

## 2015-11-16 DIAGNOSIS — I517 Cardiomegaly: Secondary | ICD-10-CM | POA: Diagnosis not present

## 2015-11-16 DIAGNOSIS — R0600 Dyspnea, unspecified: Secondary | ICD-10-CM | POA: Diagnosis present

## 2015-11-16 DIAGNOSIS — I071 Rheumatic tricuspid insufficiency: Secondary | ICD-10-CM | POA: Insufficient documentation

## 2015-11-16 DIAGNOSIS — I2589 Other forms of chronic ischemic heart disease: Secondary | ICD-10-CM | POA: Diagnosis not present

## 2015-11-16 LAB — BASIC METABOLIC PANEL
BUN: 37 mg/dL — ABNORMAL HIGH (ref 7–25)
CHLORIDE: 104 mmol/L (ref 98–110)
CO2: 20 mmol/L (ref 20–31)
CREATININE: 1.93 mg/dL — AB (ref 0.70–1.11)
Calcium: 8.9 mg/dL (ref 8.6–10.3)
Glucose, Bld: 122 mg/dL — ABNORMAL HIGH (ref 65–99)
Potassium: 4.3 mmol/L (ref 3.5–5.3)
Sodium: 137 mmol/L (ref 135–146)

## 2015-11-16 LAB — ECHOCARDIOGRAM COMPLETE: WEIGHTICAEL: 3584 [oz_av]

## 2015-11-16 MED ORDER — FUROSEMIDE 80 MG PO TABS: 80.0000 mg | ORAL_TABLET | Freq: Every day | ORAL | Status: DC

## 2015-11-16 NOTE — Progress Notes (Signed)
Patient ID: [name]    DOB: January 08, 1934     [id number]     HPI: Shane [name] is a 82 y.o. male patient of Dr. Caryl Comes referred to HTN clinic by Shane Jester, PA for medication monitoring/ titration for heart failure. He has a PMH of HFrEF (LVEF 35-40%), ischemic heart disease with remote bypass surgery, LBBB, DM2, BPH, CKD, and HLD.   He reports gaining about 1 lb per day since last visit and that he does not check his BP at home. He did stop his spironolactone as instructed after K elevation to 5.8.   Current HF meds:  Carvedilol 25 mg BID Furosemide 40 mg every other day Entresto 49/51 BID  BP goal: <150/89mmHg  Family History: family history includes Bone cancer in his brother; Colon polyps in his father; Diabetes in his sister; Heart attack in his father; Heart disease in his brother and father; Lung cancer in his mother. There is no history of Lung disease.  Social History:  reports that he quit smoking about 57 years ago. His smoking use included Cigarettes, Pipe, and Cigars. He has a 5 pack-year smoking history. He has never used smokeless tobacco. He reports that he does not drink alcohol or use illicit drugs.   Home BP readings: Does not check at home.   Wt Readings from Last 3 Encounters:  11/16/15 224 lb (101.606 kg)  11/02/15 213 lb 1.9 oz (96.671 kg)  10/19/15 220 lb 9.6 oz (100.064 kg)   BP Readings from Last 3 Encounters:  11/16/15 148/70  11/02/15 138/74  10/19/15 150/88   Pulse Readings from Last 3 Encounters:  11/16/15 65  11/02/15 62  10/19/15 70    Renal function: Estimated Creatinine Clearance: 31.8 mL/min (by C-G formula based on Cr of 2.21).  Past Medical History  Diagnosis Date  . Coronary artery disease     remote CABG in 1985, cath in 2003 by Dr. Lia Foyer with no PCI, last nuclear in 2010 showing scar and EF of 28%.   . Dyslipidemia   . Hypercholesterolemia   . Left ventricular dysfunction     28% per nuclear in 2010 and 35 to  40% per echo in 2010  . HTN (hypertension)   . Diabetes mellitus   . Cellulitis of arm, left     MSSA  . Spinal stenosis   . BPH (benign prostatic hyperplasia)   . Allergic rhinitis 01-08-13    Uses nebulizer for chronic sinus issues and Mucinex.  . Abnormal nuclear cardiac imaging test Nov 2010    moderate area of infarct in the inferior wall with only minimal reversibility and EF of 28%  . Myocardial infarction ([place])     1985  . Pneumonia     hx of several times years ago   . Blood transfusion     ? at time of bypass surgery   . Arthritis     osteoarthritis   . Colon polyps     adenomatous  . Neuromuscular disorder ([place])     legs mild paralysis-able to walk"nerve damage"-legs- left leg brace   . CKD (chronic kidney disease), stage III     "was told due to meds he takes"-no Renal workup done  . Glaucoma 01-08-13    tx. eye drops    Current Outpatient Prescriptions on File Prior to Visit  Medication Sig Dispense Refill  . acetaminophen (TYLENOL) 500 MG tablet Take 1,000 mg by mouth as needed for pain.     Marland Kitchen  amLODipine (NORVASC) 10 MG tablet Take 10 mg by mouth daily.    Marland Kitchen aspirin EC 81 MG tablet Take 81 mg by mouth every morning.    Marland Kitchen atorvastatin (LIPITOR) 10 MG tablet Take 10 mg by mouth daily.    Marland Kitchen b complex vitamins tablet Take 1 tablet by mouth daily.    . carvedilol (COREG) 25 MG tablet Take 1 tablet (25 mg total) by mouth 2 (two) times daily. 60 tablet 11  . Cholecalciferol (VITAMIN D-3) 1000 UNITS CAPS Take 2,000 Units by mouth daily.    Marland Kitchen CIALIS 20 MG tablet Take 20 mg by mouth daily as needed for erectile dysfunction.     . diphenhydrAMINE (BENADRYL) 25 mg capsule Take 25 mg by mouth every 6 (six) hours as needed for allergies.     Marland Kitchen EPINEPHrine (EPIPEN) 0.3 mg/0.3 mL SOAJ injection Inject 0.3 mLs (0.3 mg total) into the muscle once. 2 Device 0  . fluocinonide cream (LIDEX) [id number] % Apply 1 application topically 2 (two) times daily.    . furosemide (LASIX) 40 MG tablet  Take 1 tablet (40 mg total) by mouth every other day. 15 tablet 11  . metFORMIN (GLUCOPHAGE) 500 MG tablet TAKE ONE TABLET BY MOUTH ONCE DAILY 90 tablet 3  . Multiple Vitamin (MULTIVITAMIN) tablet Take 1 tablet by mouth daily.    . naproxen sodium (ANAPROX) 220 MG tablet Take 220 mg by mouth daily as needed (back pain).    Marland Kitchen omalizumab (XOLAIR) 150 MG injection Inject 150 mg into the skin every 14 (fourteen) days.    . sacubitril-valsartan (ENTRESTO) 49-51 MG Take 1 tablet by mouth 2 (two) times daily. 60 tablet 11  . testosterone cypionate (DEPOTESTOTERONE CYPIONATE) 100 MG/ML injection Inject 400 mg into the muscle every 21 ( twenty-one) days. For IM use only    . triamcinolone (NASACORT AQ) 55 MCG/ACT AERO nasal inhaler Place 2 sprays into the nose daily.    Marland Kitchen zolpidem (AMBIEN) 10 MG tablet Take 1/2 to 1 tablet at bedtime as needed for sleep. Additional refills from PCP 30 tablet 2   No current facility-administered medications on file prior to visit.    Allergies  Allergen Reactions  . Codeine Other (See Comments)    anxiety  . Hydrocodone Other (See Comments)    anxiety  . Azithromycin Rash     Assessment/Plan: 1. HTN/HFrEF: BP at goal of <150/90 and currently on carvedilol, furosemide, and Entresto after stopping spironolactone due to hyperkalemia.  Patients weight is currently up 11 lbs since last visit on 3/30, up 4 lbs from visit on 3/16. Will check BMET today; Dr. Caryl Comes to address changes in diuretics. Goal to restart spironolactone if potassium has normalized.

## 2015-11-16 NOTE — Telephone Encounter (Signed)
I spoke with the device team and Dr Caryl Comes and [employer] rep have to be available to review the echo images for AV optimization. Dr Caryl Comes will be in clinic this evening and there was a cancellation for an echo today at 1:00.  I have rescheduled this test to later today and [employer] has been notified.  I spoke with the pt and made him aware of change in schedule.    Plan today: 1. Chest x-ray at [facility] 2. HTN clinic at 11:30, recheck BMP 3. AV optimization echo at 1:00

## 2015-11-16 NOTE — Progress Notes (Signed)
This encounter was created in error - please disregard.

## 2015-11-16 NOTE — Addendum Note (Signed)
Addended by: Newt Minion on: 11/16/2015 02:31 PM   Modules accepted: Orders, Level of Service, SmartSet

## 2015-11-16 NOTE — Telephone Encounter (Signed)
Pt is calling in to get the new dosages for his medications. Please assist pt.   Thanks

## 2015-11-16 NOTE — Telephone Encounter (Signed)
This encounter was created in error - please disregard.

## 2015-11-16 NOTE — Telephone Encounter (Signed)
The pt called back into the office to get further instructions in regards to medication adjustment based on the results of his tests today.  I reviewed CXR and BMP results with Dr Caryl Comes and he would like the pt to increase Furosemide to 80mg  daily, continue to hold spironolactone.  New Rx sent to the pharmacy. The pt will continue to weigh daily and I will have Heather contact the pt next week to determine if his symptoms are improving.  Dr Caryl Comes will need to determine any further recommendations based on how the pt is progressing.

## 2015-11-21 NOTE — Telephone Encounter (Signed)
Per Dr. Caryl Comes- continue medications as he is currently taking them. I left a message on the patient's cell number to advise him of this and asked that he call back with any further questions or concerns.

## 2015-11-21 NOTE — Telephone Encounter (Signed)
I called the patient to follow up on his symptoms. He states that he is still having some mild SOB, but it is no worse than it has been. His baseline weight at home is 212-216 lbs. He states he was around 224 lbs when he was in our office last week. Today he is 212.3 lbs on his home scale. He has some swelling to his lower extremities throughout the day, but this resolves over night.  He is still taking lasix 80 mg daily. I advised the patient I would forward to Dr. Caryl Comes to review and call him back.

## 2015-11-24 ENCOUNTER — Telehealth: Payer: Self-pay | Admitting: Pulmonary Disease

## 2015-11-24 NOTE — Telephone Encounter (Addendum)
#   vials:4 Ordered date:11/23/15 ordered at 5:30 Shipping Date:4/21 or 11/27/15

## 2015-11-27 ENCOUNTER — Ambulatory Visit (INDEPENDENT_AMBULATORY_CARE_PROVIDER_SITE_OTHER): Payer: Medicare Other

## 2015-11-27 DIAGNOSIS — E291 Testicular hypofunction: Secondary | ICD-10-CM | POA: Diagnosis not present

## 2015-11-27 DIAGNOSIS — J4540 Moderate persistent asthma, uncomplicated: Secondary | ICD-10-CM | POA: Diagnosis not present

## 2015-11-27 NOTE — Telephone Encounter (Signed)
#   Vials:4 Arrival Date:11/27/15 Lot [id number] Exp Date:11/20

## 2015-11-28 MED ORDER — OMALIZUMAB 150 MG ~~LOC~~ SOLR
300.0000 mg | Freq: Once | SUBCUTANEOUS | Status: AC
  Administered 2015-11-27: 300 mg via SUBCUTANEOUS

## 2015-12-07 ENCOUNTER — Ambulatory Visit (HOSPITAL_BASED_OUTPATIENT_CLINIC_OR_DEPARTMENT_OTHER): Payer: Medicare Other | Attending: Internal Medicine | Admitting: Cardiology

## 2015-12-07 ENCOUNTER — Other Ambulatory Visit (INDEPENDENT_AMBULATORY_CARE_PROVIDER_SITE_OTHER): Payer: Medicare Other

## 2015-12-07 ENCOUNTER — Encounter: Payer: Self-pay | Admitting: Internal Medicine

## 2015-12-07 ENCOUNTER — Ambulatory Visit (INDEPENDENT_AMBULATORY_CARE_PROVIDER_SITE_OTHER): Payer: Medicare Other | Admitting: Internal Medicine

## 2015-12-07 VITALS — BP 162/84 | HR 73 | Temp 97.7°F | Resp 16 | Ht 70.5 in | Wt 220.0 lb

## 2015-12-07 VITALS — Ht 70.0 in | Wt 222.0 lb

## 2015-12-07 DIAGNOSIS — G4700 Insomnia, unspecified: Secondary | ICD-10-CM

## 2015-12-07 DIAGNOSIS — N183 Chronic kidney disease, stage 3 unspecified: Secondary | ICD-10-CM

## 2015-12-07 DIAGNOSIS — G4736 Sleep related hypoventilation in conditions classified elsewhere: Secondary | ICD-10-CM | POA: Diagnosis not present

## 2015-12-07 DIAGNOSIS — I493 Ventricular premature depolarization: Secondary | ICD-10-CM | POA: Insufficient documentation

## 2015-12-07 DIAGNOSIS — M4806 Spinal stenosis, lumbar region: Secondary | ICD-10-CM

## 2015-12-07 DIAGNOSIS — M48061 Spinal stenosis, lumbar region without neurogenic claudication: Secondary | ICD-10-CM

## 2015-12-07 DIAGNOSIS — R0683 Snoring: Secondary | ICD-10-CM | POA: Diagnosis not present

## 2015-12-07 DIAGNOSIS — I10 Essential (primary) hypertension: Secondary | ICD-10-CM

## 2015-12-07 DIAGNOSIS — E7800 Pure hypercholesterolemia, unspecified: Secondary | ICD-10-CM

## 2015-12-07 DIAGNOSIS — E1122 Type 2 diabetes mellitus with diabetic chronic kidney disease: Secondary | ICD-10-CM | POA: Diagnosis not present

## 2015-12-07 DIAGNOSIS — Z79899 Other long term (current) drug therapy: Secondary | ICD-10-CM | POA: Diagnosis not present

## 2015-12-07 DIAGNOSIS — I25119 Atherosclerotic heart disease of native coronary artery with unspecified angina pectoris: Secondary | ICD-10-CM | POA: Diagnosis not present

## 2015-12-07 DIAGNOSIS — I25810 Atherosclerosis of coronary artery bypass graft(s) without angina pectoris: Secondary | ICD-10-CM | POA: Diagnosis not present

## 2015-12-07 DIAGNOSIS — G4730 Sleep apnea, unspecified: Secondary | ICD-10-CM

## 2015-12-07 DIAGNOSIS — J0100 Acute maxillary sinusitis, unspecified: Secondary | ICD-10-CM

## 2015-12-07 DIAGNOSIS — G478 Other sleep disorders: Secondary | ICD-10-CM | POA: Diagnosis not present

## 2015-12-07 DIAGNOSIS — I255 Ischemic cardiomyopathy: Secondary | ICD-10-CM | POA: Diagnosis not present

## 2015-12-07 DIAGNOSIS — G4733 Obstructive sleep apnea (adult) (pediatric): Secondary | ICD-10-CM | POA: Diagnosis present

## 2015-12-07 DIAGNOSIS — J019 Acute sinusitis, unspecified: Secondary | ICD-10-CM | POA: Insufficient documentation

## 2015-12-07 DIAGNOSIS — I5022 Chronic systolic (congestive) heart failure: Secondary | ICD-10-CM

## 2015-12-07 HISTORY — PX: PR POLYSOM 6/>YRS SLEEP 4/> ADDL PARAM ATTND: 95810

## 2015-12-07 LAB — CBC WITH DIFFERENTIAL/PLATELET
BASOS ABS: 0 10*3/uL (ref 0.0–0.1)
BASOS PCT: 0.2 % (ref 0.0–3.0)
Eosinophils Absolute: 0.2 10*3/uL (ref 0.0–0.7)
Eosinophils Relative: 2.6 % (ref 0.0–5.0)
HEMATOCRIT: 42.8 % (ref 39.0–52.0)
Hemoglobin: 14 g/dL (ref 13.0–17.0)
LYMPHS ABS: 1.1 10*3/uL (ref 0.7–4.0)
LYMPHS PCT: 13.8 % (ref 12.0–46.0)
MCHC: 32.8 g/dL (ref 30.0–36.0)
MCV: 91.8 fl (ref 78.0–100.0)
MONOS PCT: 8.4 % (ref 3.0–12.0)
Monocytes Absolute: 0.6 10*3/uL (ref 0.1–1.0)
NEUTROS ABS: 5.7 10*3/uL (ref 1.4–7.7)
NEUTROS PCT: 75 % (ref 43.0–77.0)
PLATELETS: 118 10*3/uL — AB (ref 150.0–400.0)
RBC: 4.67 Mil/uL (ref 4.22–5.81)
RDW: 14.2 % (ref 11.5–15.5)
WBC: 7.6 10*3/uL (ref 4.0–10.5)

## 2015-12-07 LAB — HEMOGLOBIN A1C: Hgb A1c MFr Bld: 6.3 % (ref 4.6–6.5)

## 2015-12-07 MED ORDER — ZOLPIDEM TARTRATE 10 MG PO TABS: ORAL_TABLET | ORAL | Status: DC

## 2015-12-07 MED ORDER — AMOXICILLIN 500 MG PO CAPS: 500.0000 mg | ORAL_CAPSULE | Freq: Three times a day (TID) | ORAL | Status: DC

## 2015-12-07 NOTE — Assessment & Plan Note (Signed)
Given his history and current symptoms he likely has acute bacterial sinusitis Will treat with amoxicillin He will call there is no improvement

## 2015-12-07 NOTE — Assessment & Plan Note (Signed)
Taking atorvastatin 10 mg daily

## 2015-12-07 NOTE — Progress Notes (Signed)
Pre visit review using our clinic review tool, if applicable. No additional management support is needed unless otherwise documented below in the visit note. 

## 2015-12-07 NOTE — Progress Notes (Signed)
Subjective:    Patient ID: Shane [name], male    DOB: 06-28-1934, 82 y.o.   MRN: [id number]  HPI He is here to establish with a new pcp.     Sinus infection: He has a history of bleeding a couple) infections a year. He has now. If symptoms started yesterday. He states burning in his eyes associated with watering. He has nasal congestion, sinus pressure, postnasal drip, cough, headache. The symptoms are consistent with his prior infections. He has not had any fevers or dizziness. His sinus infections typically progress and can develop into bronchitis. He is typically treated with amoxicillin.   Spinal stenosis: He has had 4 back surgeries in the past. He denies any back pain on a daily basis, but does have residual numbness and tingling in both legs and his feet. He also has a left footdrop related to a prior injury. He is able to exercise-rides an exercise bike 45 minutes.  He does take Aleve only as needed, but not regularly.  Diabetes: He is taking his medication daily as prescribed. He is compliant with a diabetic diet. He is exercising regularly. His diabetes has been controlled. He does not always check his feet daily and denies foot lesions. He is up-to-date with an ophthalmology examination.   Ischemic cardiomyopathy, coronary artery disease, hypertension. He is taking his medication daily. His blood pressure is slightly elevated here today. He just saw his cardiologist and his medications were changed. Adjustment of medication dose was anticipated. He is fairly compliant with a low sodium diet. He has chronic leg swelling and takes Lasix daily. He denies any chest pain or palpitations. He denies shortness of breath.  Insomnia: He does have difficulty sleeping at times. He takes the Ambien most nights, but not always. He denies any side effects from the medication.  Medications and allergies reviewed with patient and updated if appropriate.  Patient Active Problem List   Diagnosis  Date Noted  . Essential hypertension 11/16/2015  . LBBB (left bundle branch block) 10/10/2014  . Syncope 10/06/2014  . Pain in thumb joint with movement of right hand 01/04/2014  . Sinusitis, chronic 07/16/2013  . Asthma, moderate persistent 07/08/2013  . CAD s/p coronary arthery bypass graft   . BPH (benign prostatic hyperplasia)   . Kidney disease, chronic, stage III (GFR 30-59 ml/min)   . H/O Spinal stenosis   . DM (diabetes mellitus), type 2 with renal complications ([place]) [id number]  . Benign hypertensive heart disease without heart failure 11/07/2010  . Allergic rhinitis 11/07/2010  . Osteoarthritis 11/07/2010  . Ischemic cardiomyopathy   . Dyslipidemia   . Hypercholesterolemia     Current Outpatient Prescriptions on File Prior to Visit  Medication Sig Dispense Refill  . acetaminophen (TYLENOL) 500 MG tablet Take 1,000 mg by mouth as needed for pain.     Marland Kitchen amLODipine (NORVASC) 10 MG tablet Take 10 mg by mouth daily.    Marland Kitchen aspirin EC 81 MG tablet Take 81 mg by mouth every morning.    Marland Kitchen atorvastatin (LIPITOR) 10 MG tablet Take 10 mg by mouth daily.    Marland Kitchen b complex vitamins tablet Take 1 tablet by mouth daily.    . carvedilol (COREG) 25 MG tablet Take 1 tablet (25 mg total) by mouth 2 (two) times daily. 60 tablet 11  . Cholecalciferol (VITAMIN D-3) 1000 UNITS CAPS Take 2,000 Units by mouth daily.    Marland Kitchen CIALIS 20 MG tablet Take 20 mg by mouth daily as  needed for erectile dysfunction.     . diphenhydrAMINE (BENADRYL) 25 mg capsule Take 25 mg by mouth every 6 (six) hours as needed for allergies.     . fluocinonide cream (LIDEX) [id number] % Apply 1 application topically 2 (two) times daily.    . furosemide (LASIX) 80 MG tablet Take 1 tablet (80 mg total) by mouth daily. 30 tablet 6  . metFORMIN (GLUCOPHAGE) 500 MG tablet TAKE ONE TABLET BY MOUTH ONCE DAILY 90 tablet 3  . Multiple Vitamin (MULTIVITAMIN) tablet Take 1 tablet by mouth daily.    . naproxen sodium (ANAPROX) 220 MG tablet Take 220  mg by mouth daily as needed (back pain).    Marland Kitchen omalizumab (XOLAIR) 150 MG injection Inject 150 mg into the skin every 14 (fourteen) days.    . sacubitril-valsartan (ENTRESTO) 49-51 MG Take 1 tablet by mouth 2 (two) times daily. 60 tablet 11  . testosterone cypionate (DEPOTESTOTERONE CYPIONATE) 100 MG/ML injection Inject 400 mg into the muscle every 21 ( twenty-one) days. For IM use only    . triamcinolone (NASACORT AQ) 55 MCG/ACT AERO nasal inhaler Place 2 sprays into the nose daily.    Marland Kitchen zolpidem (AMBIEN) 10 MG tablet Take 1/2 to 1 tablet at bedtime as needed for sleep. Additional refills from PCP 30 tablet 2   No current facility-administered medications on file prior to visit.    Past Medical History  Diagnosis Date  . Coronary artery disease     remote CABG in 1985, cath in 2003 by Dr. Lia Foyer with no PCI, last nuclear in 2010 showing scar and EF of 28%.   . Dyslipidemia   . Hypercholesterolemia   . Left ventricular dysfunction     28% per nuclear in 2010 and 35 to 40% per echo in 2010  . HTN (hypertension)   . Diabetes mellitus   . Cellulitis of arm, left     MSSA  . Spinal stenosis   . BPH (benign prostatic hyperplasia)   . Allergic rhinitis 01-08-13    Uses nebulizer for chronic sinus issues and Mucinex.  . Abnormal nuclear cardiac imaging test Nov 2010    moderate area of infarct in the inferior wall with only minimal reversibility and EF of 28%  . Myocardial infarction ([place])     1985  . Pneumonia     hx of several times years ago   . Blood transfusion     ? at time of bypass surgery   . Arthritis     osteoarthritis   . Colon polyps     adenomatous  . Neuromuscular disorder ([place])     legs mild paralysis-able to walk"nerve damage"-legs- left leg brace   . CKD (chronic kidney disease), stage III     "was told due to meds he takes"-no Renal workup done  . Glaucoma 01-08-13    tx. eye drops    Past Surgical History  Procedure Laterality Date  . Cardiac catheterization   2003  . Lumbar disc surgery    . Prostate surgery    . Back surgery      hx of back surgery x 4   . Total knee arthroplasty  08/01/2011    Procedure: TOTAL KNEE ARTHROPLASTY;  Surgeon: Johnn Hai;  Location: [facility] ORS;  Service: Orthopedics;  Laterality: Right;  . Cataract       recent cataract surgery 6'14  . Coronary artery bypass graft  1985    x 5 vessels  . Colonoscopy N/A 01/25/2013  Procedure: COLONOSCOPY;  Surgeon: Irene Shipper, MD;  Location: [facility] ENDOSCOPY;  Service: Endoscopy;  Laterality: N/A;  . Bi-ventricular implantable cardioverter defibrillator N/A 10/10/2014    Procedure: BI-VENTRICULAR IMPLANTABLE CARDIOVERTER DEFIBRILLATOR  (CRT-D);  Surgeon: [name], MD;  Location: [facility] CATH LAB;  Service: Cardiovascular;  Laterality: N/A;    Social History   Social History  . Marital Status: Married    Spouse Name: N/A  . Number of Children: 2  . Years of Education: N/A   Occupational History  . pastor    Social History Main Topics  . Smoking status: Former Smoker -- 1.00 packs/day for 5 years    Types: Cigarettes, Pipe, Cigars    Quit date: 08/05/1958  . Smokeless tobacco: Never Used  . Alcohol Use: No  . Drug Use: No  . Sexual Activity: Not Currently   Other Topics Concern  . Not on file   Social History Narrative   Originally from [place]. He has always lived in [place]. Prior travel to [place], [place], [place], [place] ,[place], & [place]. 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 Has adopted children. Has a dog currently. No bird exposure. No mold exposure. Enjoys reading & traveling.    Family History  Problem Relation Age of Onset  . Heart attack Father   . Heart disease Father   . Colon polyps Father   . Diabetes Sister     x 2  . Heart disease Brother     x 2  . Lung cancer Mother   . Bone cancer Brother   . Lung disease Neg Hx     Review of Systems  Constitutional: Negative for fever and chills.  HENT: Positive for congestion,  postnasal drip and sinus pressure. Negative for ear pain and sore throat.   Respiratory: Positive for cough. Negative for shortness of breath and wheezing.   Cardiovascular: Positive for leg swelling (sometimes controlled). Negative for chest pain and palpitations.  Gastrointestinal: Negative for nausea, abdominal pain and blood in stool.       No gerd  Genitourinary: Negative for dysuria, hematuria and difficulty urinating.  Musculoskeletal: Positive for arthralgias (right thumb). Negative for back pain.  Neurological: Positive for numbness and headaches. Negative for dizziness and light-headedness.  Psychiatric/Behavioral: Positive for sleep disturbance. Negative for dysphoric mood. The patient is not nervous/anxious.        Objective:   Filed Vitals:   12/07/15 0853  BP: 162/84  Pulse: 73  Temp: 97.7 F (36.5 C)  Resp: 16   Filed Weights   12/07/15 0853  Weight: 220 lb (99.791 kg)   Body mass index is 31.11 kg/(m^2).   Physical Exam Constitutional: He appears well-developed and well-nourished. No distress.  HENT:  Head: Normocephalic and atraumatic.  Right Ear: External ear normal.  Left Ear: External ear normal.  Mouth/Throat: Oropharynx is clear and moist.  Normal ear canals and TM b/l  Eyes: Conjunctivae and EOM are normal.  Neck: Neck supple. No tracheal deviation present. No thyromegaly present.  No carotid bruit  Cardiovascular: Normal rate, regular rhythm, normal heart sounds and intact distal pulses.   2/6 systolic murmur heard. Pulmonary/Chest: Effort normal and breath sounds normal. No respiratory distress. He has no wheezes. He has no rales.  Abdominal: Soft. He exhibits no distension. There is no tenderness.  Musculoskeletal: He exhibits no edema.  Lymphadenopathy:    He has no cervical adenopathy.  Skin: Skin is warm and dry. He is not diaphoretic.  Psychiatric:  He has a normal mood and affect. His behavior is normal.         Assessment & Plan:    See Problem List for Assessment and Plan of chronic medical problems.  Follow-up in 6 months

## 2015-12-07 NOTE — Assessment & Plan Note (Signed)
Blood pressure slightly elevated here today, but his medication was recently changed and adjustments were anticipated We'll contact cardiology-most likely the entestro will be increased

## 2015-12-07 NOTE — Assessment & Plan Note (Signed)
Kidney function has been stable-review recent CMP Advised him to avoid Aleve as much as possible

## 2015-12-07 NOTE — Assessment & Plan Note (Signed)
No chest pain. No symptoms of CHF Chronic edema at baseline per patient Medications to be adjusted by cardiology

## 2015-12-07 NOTE — Patient Instructions (Signed)
  Test(s) ordered today. Your results will be released to [place] (or called to you) after review, usually within 72hours after test completion. If any changes need to be made, you will be notified at that same time.   Medications reviewed and updated.  Changes include starting amoxicillin for your sinus infection.  Your prescription(s) have been submitted to your pharmacy. Please take as directed and contact our office if you believe you are having problem(s) with the medication(s).   Please followup in 6 months

## 2015-12-07 NOTE — Assessment & Plan Note (Signed)
Check A1c Diabetes has been controlled per patient Continue metformin at current dose He is exercising regularly Discussed working on weight loss. F/u in 6 months

## 2015-12-07 NOTE — Assessment & Plan Note (Signed)
S/p 4 back surgeries  No chronic back pain Residual numbness/tingling in bilateral feet and legs Exercising regularly Takes Aleve only as needed

## 2015-12-08 ENCOUNTER — Encounter: Payer: Self-pay | Admitting: Emergency Medicine

## 2015-12-08 DIAGNOSIS — H34211 Partial retinal artery occlusion, right eye: Secondary | ICD-10-CM | POA: Diagnosis not present

## 2015-12-08 DIAGNOSIS — D3131 Benign neoplasm of right choroid: Secondary | ICD-10-CM | POA: Diagnosis not present

## 2015-12-08 DIAGNOSIS — D3132 Benign neoplasm of left choroid: Secondary | ICD-10-CM | POA: Diagnosis not present

## 2015-12-08 DIAGNOSIS — H43813 Vitreous degeneration, bilateral: Secondary | ICD-10-CM | POA: Diagnosis not present

## 2015-12-11 ENCOUNTER — Ambulatory Visit (INDEPENDENT_AMBULATORY_CARE_PROVIDER_SITE_OTHER): Payer: Medicare Other

## 2015-12-11 DIAGNOSIS — J4540 Moderate persistent asthma, uncomplicated: Secondary | ICD-10-CM

## 2015-12-12 MED ORDER — OMALIZUMAB 150 MG ~~LOC~~ SOLR
300.0000 mg | Freq: Once | SUBCUTANEOUS | Status: AC
  Administered 2015-12-12: 300 mg via SUBCUTANEOUS

## 2015-12-14 DIAGNOSIS — E291 Testicular hypofunction: Secondary | ICD-10-CM | POA: Diagnosis not present

## 2015-12-16 ENCOUNTER — Telehealth: Payer: Self-pay | Admitting: Cardiology

## 2015-12-16 ENCOUNTER — Encounter (HOSPITAL_BASED_OUTPATIENT_CLINIC_OR_DEPARTMENT_OTHER): Payer: Self-pay | Admitting: Cardiology

## 2015-12-16 DIAGNOSIS — G4733 Obstructive sleep apnea (adult) (pediatric): Secondary | ICD-10-CM

## 2015-12-16 NOTE — Procedures (Signed)
   Patient Name: Shane [name], Shane [name] MRN: [id number] Study Date: 12/07/2015 Gender: Male D.O.B: Jun 07, 1934 Age (years): 26 Referring Provider: Virl Axe Interpreting Physician: Fransico Him MD, ABSM RPSGT: Baxter Flattery  BMI: 32 Height (inches): 70 Neck Size: 20.00 Weight (lbs): 222  CLINICAL INFORMATION Sleep Study Type: NPSG  Indication for sleep study: Fatigue, Restless Sleep with Limb Movments, Snoring, Witnesses Apnea / Gasping During Sleep  SLEEP STUDY TECHNIQUE As per the AASM Manual for the Scoring of Sleep and Associated Events v2.3 (April 2016) with a hypopnea requiring 4% desaturations. The channels recorded and monitored were frontal, central and occipital EEG, electrooculogram (EOG), submentalis EMG (chin), nasal and oral airflow, thoracic and abdominal wall motion, anterior tibialis EMG, snore microphone, electrocardiogram, and pulse oximetry.  MEDICATIONS Patient's medications include: Reviewed in the chart. Medications self-administered by patient during sleep study : Sleep medicine administered - Ambien 10 mg at 09:30:41 PM  SLEEP ARCHITECTURE The study was initiated at 10:56:40 PM and ended at 4:58:56 AM. Sleep onset time was 2.4 minutes and the sleep efficiency was reduced at 58.5%. The total sleep time was 212.0 minutes. Stage REM latency was prolonged at 267.0 minutes. The patient spent 19.81% of the night in stage N1 sleep, 70.52% in stage N2 sleep, 0.00% in stage N3 and 9.67% in REM. Alpha intrusion was absent. Supine sleep was 34.72%.  RESPIRATORY PARAMETERS The overall apnea/hypopnea index (AHI) was 51.5 per hour. There were 92 total apneas, including 84 obstructive, 8 central and 0 mixed apneas. There were 90 hypopneas and 0 RERAs. The AHI during Stage REM sleep was 52.7 per hour. AHI while supine was 89.7 per hour. The mean oxygen saturation was 91.94%. The minimum SpO2 during sleep was 76.00%. Loud  snoring was noted during this study.  CARDIAC DATA The 2 lead EKG demonstrated sinus rhythm. The mean heart rate was 52.91 beats per minute. Other EKG findings include: PVCs  LEG MOVEMENT DATA The total PLMS were 168 with a resulting PLMS index of 47.55. Associated arousal with leg movement index was 1.1 .  IMPRESSIONS - Severe obstructive sleep apnea occurred during this study (AHI = 51.5/h). - No significant central sleep apnea occurred during this study (CAI = 2.3/h). - Moderate oxygen desaturation was noted during this study (Min O2 = 76.00%). - The patient snored with Loud snoring volume. - PVCs were noted during this study. - Moderate periodic limb movements of sleep occurred during the study. No significant associated arousals.  DIAGNOSIS - Obstructive Sleep Apnea (327.23 [G47.33 ICD-10]) - Nocturnal Hypoxemia (327.26 [G47.36 ICD-10])  RECOMMENDATIONS - Therapeutic CPAP titration to determine optimal pressure required to alleviate sleep disordered breathing. - Positional therapy avoiding supine position during sleep. - Avoid alcohol, sedatives and other CNS depressants that may worsen sleep apnea and disrupt normal sleep architecture. - Sleep hygiene should be reviewed to assess factors that may improve sleep quality. - Weight management and regular exercise should be initiated or continued if appropriate.   [place], American Board of Sleep Medicine  ELECTRONICALLY SIGNED ON: 12/16/2015, 7:07 PM [place] PH: [phone] FX: [phone] [place]

## 2015-12-16 NOTE — Telephone Encounter (Signed)
Please let patient know that they have sleep apnea and recommend CPAP titration. Please set up titration in the sleep lab. 

## 2015-12-17 ENCOUNTER — Encounter: Payer: Self-pay | Admitting: Internal Medicine

## 2015-12-17 DIAGNOSIS — G4733 Obstructive sleep apnea (adult) (pediatric): Secondary | ICD-10-CM | POA: Insufficient documentation

## 2015-12-18 ENCOUNTER — Telehealth: Payer: Self-pay | Admitting: Pulmonary Disease

## 2015-12-18 NOTE — Telephone Encounter (Signed)
#   vials:4 Ordered date:12/15/15 Per Joellen Jersey ok to wait. Shipping Date:12/19/15

## 2015-12-19 NOTE — Telephone Encounter (Signed)
#   Vials:4 Arrival Date:12/19/15 Lot [id number] Exp Date:12/20

## 2015-12-22 NOTE — Telephone Encounter (Signed)
Called to discuss results with patient. He stated that it was not a good time to discuss results because he was out of town.  He asked that I call him back next week.

## 2015-12-25 ENCOUNTER — Ambulatory Visit: Payer: Medicare Other

## 2015-12-26 ENCOUNTER — Ambulatory Visit (INDEPENDENT_AMBULATORY_CARE_PROVIDER_SITE_OTHER): Payer: Medicare Other

## 2015-12-26 DIAGNOSIS — J4540 Moderate persistent asthma, uncomplicated: Secondary | ICD-10-CM

## 2015-12-26 MED ORDER — OMALIZUMAB 150 MG ~~LOC~~ SOLR
300.0000 mg | Freq: Once | SUBCUTANEOUS | Status: AC
  Administered 2015-12-26: 300 mg via SUBCUTANEOUS

## 2015-12-29 NOTE — Telephone Encounter (Signed)
Patient has been informed of information.  Stated verbal understanding.    Patient is okay to proceed with titration.  He requests a Thursday night study.

## 2015-12-29 NOTE — Addendum Note (Signed)
Addended by: Andres Ege on: 12/29/2015 10:45 AM   Modules accepted: Orders

## 2016-01-04 ENCOUNTER — Encounter: Payer: Self-pay | Admitting: *Deleted

## 2016-01-08 ENCOUNTER — Ambulatory Visit: Payer: Medicare Other

## 2016-01-08 DIAGNOSIS — E291 Testicular hypofunction: Secondary | ICD-10-CM | POA: Diagnosis not present

## 2016-01-09 ENCOUNTER — Ambulatory Visit: Payer: Medicare Other

## 2016-01-15 ENCOUNTER — Ambulatory Visit (INDEPENDENT_AMBULATORY_CARE_PROVIDER_SITE_OTHER): Payer: Medicare Other

## 2016-01-15 DIAGNOSIS — J4540 Moderate persistent asthma, uncomplicated: Secondary | ICD-10-CM

## 2016-01-16 MED ORDER — OMALIZUMAB 150 MG ~~LOC~~ SOLR
300.0000 mg | Freq: Once | SUBCUTANEOUS | Status: AC
  Administered 2016-01-15: 300 mg via SUBCUTANEOUS

## 2016-01-18 ENCOUNTER — Ambulatory Visit (INDEPENDENT_AMBULATORY_CARE_PROVIDER_SITE_OTHER): Payer: Medicare Other | Admitting: *Deleted

## 2016-01-18 DIAGNOSIS — I255 Ischemic cardiomyopathy: Secondary | ICD-10-CM

## 2016-01-18 NOTE — Progress Notes (Signed)
Remote ICD transmission.   

## 2016-01-22 ENCOUNTER — Ambulatory Visit (INDEPENDENT_AMBULATORY_CARE_PROVIDER_SITE_OTHER): Payer: Medicare Other | Admitting: Pulmonary Disease

## 2016-01-22 ENCOUNTER — Encounter: Payer: Self-pay | Admitting: Pulmonary Disease

## 2016-01-22 VITALS — BP 122/70 | HR 62 | Temp 97.4°F | Ht 70.5 in | Wt 225.0 lb

## 2016-01-22 DIAGNOSIS — J309 Allergic rhinitis, unspecified: Secondary | ICD-10-CM

## 2016-01-22 DIAGNOSIS — J4540 Moderate persistent asthma, uncomplicated: Secondary | ICD-10-CM

## 2016-01-22 DIAGNOSIS — J984 Other disorders of lung: Secondary | ICD-10-CM | POA: Diagnosis not present

## 2016-01-22 DIAGNOSIS — I255 Ischemic cardiomyopathy: Secondary | ICD-10-CM

## 2016-01-22 LAB — PULMONARY FUNCTION TEST
DL/VA % pred: 82 %
DL/VA: 3.78 ml/min/mmHg/L
DLCO UNC: 19.27 ml/min/mmHg
DLCO cor % pred: 57 %
DLCO cor: 19.01 ml/min/mmHg
DLCO unc % pred: 58 %
FEF 25-75 POST: 2.23 L/s
FEF 25-75 Pre: 1.62 L/sec
FEF2575-%Change-Post: 37 %
FEF2575-%PRED-POST: 111 %
FEF2575-%PRED-PRE: 81 %
FEV1-%CHANGE-POST: 6 %
FEV1-%PRED-PRE: 76 %
FEV1-%Pred-Post: 81 %
FEV1-POST: 2.37 L
FEV1-PRE: 2.23 L
FEV1FVC-%Change-Post: 3 %
FEV1FVC-%PRED-PRE: 105 %
FEV6-%Change-Post: 3 %
FEV6-%PRED-POST: 79 %
FEV6-%Pred-Pre: 77 %
FEV6-Post: 3.05 L
FEV6-Pre: 2.96 L
FEV6FVC-%CHANGE-POST: 0 %
FEV6FVC-%Pred-Post: 107 %
FEV6FVC-%Pred-Pre: 107 %
FVC-%Change-Post: 3 %
FVC-%PRED-POST: 74 %
FVC-%Pred-Pre: 72 %
FVC-PRE: 2.97 L
FVC-Post: 3.06 L
POST FEV1/FVC RATIO: 77 %
PRE FEV1/FVC RATIO: 75 %
PRE FEV6/FVC RATIO: 100 %
Post FEV6/FVC ratio: 100 %
RV % pred: 91 %
RV: 2.45 L
TLC % PRED: 77 %
TLC: 5.56 L

## 2016-01-22 NOTE — Patient Instructions (Signed)
   We are keeping your Xolair as prescribed.  Start taking Zyrtec daily for your allergies.  Call me if you have any new breathing problems before your next appointment in 6 months.  TESTS ORDERED: 1. Spirometry with bronchodilator challenge & DLCO at next appointment 2. 6MWT on room air at next appointment

## 2016-01-22 NOTE — Progress Notes (Signed)
PFT done today. 

## 2016-01-22 NOTE — Progress Notes (Signed)
Subjective:    Patient ID: Shane [name], male    DOB: 1933/12/18, 82 y.o.   MRN: [id number]  C.C.:  Follow-up for Moderate, Persistent Asthma & Allergic Rhinitis.  HPI Moderate, Persistent Asthma:  Currently on Xolair. He reports no new breathing problems since last visit. No coughing or wheezing. He reports he does have some wheezing when laying recumbent. No nocturnal awakenings with coughing or wheezing.   Allergic Rhinitis:  Currently on Xolair. Reports he has had only one sinus infection this year. Reports he is noticing sinus "closure" and drainage when laying recumbent. Does have sinus pressure at times. He is taking Benardryl prn - 1-2 times per day. He does take Zyrtec occasionally with some help.   Mild Restrictive Lung Disease:  Reports he has gained significant weight in the last 2-3 months. He has been started on Lasix because of volume overload. Has known CHF.  Review of Systems No fever, chills, or sweats. No chest pain, tightness, or pressure. No nausea or emesis.   Allergies  Allergen Reactions  . Codeine Other (See Comments)    anxiety  . Hydrocodone Other (See Comments)    anxiety  . Azithromycin Rash    Current Outpatient Prescriptions on File Prior to Visit  Medication Sig Dispense Refill  . acetaminophen (TYLENOL) 500 MG tablet Take 1,000 mg by mouth as needed for pain.     Marland Kitchen amLODipine (NORVASC) 10 MG tablet Take 10 mg by mouth daily.    Marland Kitchen amoxicillin (AMOXIL) 500 MG capsule Take 1 capsule (500 mg total) by mouth 3 (three) times daily. 30 capsule 0  . aspirin EC 81 MG tablet Take 81 mg by mouth every morning.    Marland Kitchen atorvastatin (LIPITOR) 10 MG tablet Take 10 mg by mouth daily.    Marland Kitchen b complex vitamins tablet Take 1 tablet by mouth daily.    . carvedilol (COREG) 25 MG tablet Take 1 tablet (25 mg total) by mouth 2 (two) times daily. 60 tablet 11  . Cholecalciferol (VITAMIN D-3) 1000 UNITS CAPS Take 2,000 Units by mouth daily.    Marland Kitchen CIALIS 20 MG tablet Take 20  mg by mouth daily as needed for erectile dysfunction.     . diphenhydrAMINE (BENADRYL) 25 mg capsule Take 25 mg by mouth every 6 (six) hours as needed for allergies.     . fluocinonide cream (LIDEX) [id number] % Apply 1 application topically 2 (two) times daily.    . furosemide (LASIX) 80 MG tablet Take 1 tablet (80 mg total) by mouth daily. 30 tablet 6  . metFORMIN (GLUCOPHAGE) 500 MG tablet TAKE ONE TABLET BY MOUTH ONCE DAILY 90 tablet 3  . Multiple Vitamin (MULTIVITAMIN) tablet Take 1 tablet by mouth daily.    . naproxen sodium (ANAPROX) 220 MG tablet Take 220 mg by mouth daily as needed (back pain).    Marland Kitchen omalizumab (XOLAIR) 150 MG injection Inject 150 mg into the skin every 14 (fourteen) days.    . sacubitril-valsartan (ENTRESTO) 49-51 MG Take 1 tablet by mouth 2 (two) times daily. 60 tablet 11  . testosterone cypionate (DEPOTESTOTERONE CYPIONATE) 100 MG/ML injection Inject 400 mg into the muscle every 21 ( twenty-one) days. For IM use only    . triamcinolone (NASACORT AQ) 55 MCG/ACT AERO nasal inhaler Place 2 sprays into the nose daily.    Marland Kitchen zolpidem (AMBIEN) 10 MG tablet Take 1/2 to 1 tablet at bedtime as needed for sleep. 30 tablet 2   No current facility-administered medications  on file prior to visit.    Past Medical History  Diagnosis Date  . Coronary artery disease     remote CABG in 1985, cath in 2003 by Dr. Lia Foyer with no PCI, last nuclear in 2010 showing scar and EF of 28%.   . Dyslipidemia   . Hypercholesterolemia   . Left ventricular dysfunction     28% per nuclear in 2010 and 35 to 40% per echo in 2010  . HTN (hypertension)   . Diabetes mellitus   . Cellulitis of arm, left     MSSA  . Spinal stenosis   . BPH (benign prostatic hyperplasia)   . Allergic rhinitis 01-08-13    Uses nebulizer for chronic sinus issues and Mucinex.  . Abnormal nuclear cardiac imaging test Nov 2010    moderate area of infarct in the inferior wall with only minimal reversibility and EF of 28%  .  Myocardial infarction ([place])     1985  . Pneumonia     hx of several times years ago   . Blood transfusion     ? at time of bypass surgery   . Arthritis     osteoarthritis   . Colon polyps     adenomatous  . Neuromuscular disorder ([place])     legs mild paralysis-able to walk"nerve damage"-legs- left leg brace   . CKD (chronic kidney disease), stage III     "was told due to meds he takes"-no Renal workup done  . Glaucoma 01-08-13    tx. eye drops    Past Surgical History  Procedure Laterality Date  . Cardiac catheterization  2003  . Lumbar disc surgery    . Prostate surgery    . Back surgery      hx of back surgery x 4   . Total knee arthroplasty  08/01/2011    Procedure: TOTAL KNEE ARTHROPLASTY;  Surgeon: Johnn Hai;  Location: [facility];  Service: Orthopedics;  Laterality: Right;  . Cataract       recent cataract surgery 6'14  . Coronary artery bypass graft  1985    x 5 vessels  . Colonoscopy N/A 01/25/2013    Procedure: COLONOSCOPY;  Surgeon: Irene Shipper, MD;  Location: [facility];  Service: Endoscopy;  Laterality: N/A;  . Bi-ventricular implantable cardioverter defibrillator N/A 10/10/2014    Procedure: BI-VENTRICULAR IMPLANTABLE CARDIOVERTER DEFIBRILLATOR  (CRT-D);  Surgeon: [name], MD;  Location: [facility] C[facility] LAB;  Service: Cardiovascular;  Laterality: N/A;  . Pr polysom 6/>yrs sleep 4/> addl param attnd  12/07/2015    Family History  Problem Relation Age of Onset  . Heart attack Father   . Heart disease Father   . Colon polyps Father   . Diabetes Sister     x 2  . Heart disease Brother     x 2  . Lung cancer Mother   . Bone cancer Brother   . Lung disease Neg Hx     Social History   Social History  . Marital Status: Married    Spouse Name: N/A  . Number of Children: 2  . Years of Education: N/A   Occupational History  . pastor    Social History Main Topics  . Smoking status: Former Smoker -- 1.00 packs/day for 5 years    Types: Cigarettes, Pipe,  Cigars    Quit date: 08/05/1958  . Smokeless tobacco: Never Used  . Alcohol Use: No  . Drug Use: No  . Sexual Activity: Not Currently  Other Topics Concern  . None   Social History Narrative   Originally from [place]. He has always lived in [place]. Prior travel to [place], [place], [place], [place] ,[place], & [place]. 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 Has adopted children. Has a dog currently. No bird exposure. No mold exposure. Enjoys reading & traveling.      Objective:   Physical Exam BP 122/70 mmHg  Pulse 62  Temp(Src) 97.4 F (36.3 C) (Oral)  Ht 5' 10.5" (1.791 m)  Wt 225 lb (102.059 kg)  BMI 31.82 kg/m2  SpO2 95% General:  Awake. Alert. No distress. Obese. Wife present today. Integument:  Warm & dry. No rash on exposed skin. No bruising. HEENT:  Moist mucus membranes. No oral ulcers. No scleral injection. Mild bilateral nasal turbinate swelling. Cardiovascular:  Regular rate. Trace edema. Normal S1 & S2.  Pulmonary:  Clear bilaterally to auscultation. Normal work of breathing on room air.. Abdomen: Soft. Normal bowel sounds. Protuberant. Nontender. Musculoskeletal:  Normal bulk and tone. No joint deformity or effusion appreciated.  PFT 01/22/16: FVC 2.97 L (72%) FEV1 2.23 L (76%) FEV1/FVC 0.75 FEF 25-75 1.16 L (81%) no bronchodilator response TLC 5.56 L (77%) RV 91% ERV 63% DLCO corrected 57% (hemoglobin 15.1) 08/30/13: FVC 3.84 L (91%) FEV1 2.85 L (94%) FEV1/FVC 0.74 FEF 25-75 2.10 L (98%) no bronchodilator response TLC 5.94 L (83%)                    ERV 84% DLCO uncorrected 64%  IMAGING CXR PA/LAT 10/11/14 (previously reviewed by me): No opacity or effusion appreciated. Heart normal in size. Pacemaker with leads noted. Low lung volumes.  V/Q 10/06/14 (per radiologist): Heterogeneous ventilation without focal defect. No evidence of pulmonary embolism.  CARDIAC TTE (10/06/14): LV mildly dilated. EF 35-40%. Regional wall motion abnormalities noted. LA  moderately dilated & RA normal in size. RV normal in size and function. Pulmonary artery systolic pressure 47 mmHg. No aortic regurgitation or stenosis. Moderate mitral regurgitation without stenosis. No pulmonic regurgitation. Mild tricuspid regurgitation. No pericardial effusion.  LABS 07/07/13 IgE: 209.1 RAST Panel: Ragweed 0.28 (class 0/1)    Assessment & Plan:  82 year old male with underlying moderate, persistent asthma. Patient's spirometry has significantly worsened since previous testing likely due to his underlying systolic congestive heart failure. He has no significant bronchodilator response that would account for uncontrolled asthma. With his symptoms being minimal with regards to his asthma I do not feel that additional inhaled medications would be of great benefit or are necessary at this time. I did recommend trying daily antihistamine therapy for his allergic rhinitis. His lung volumes confirm the mild restriction suggested on his spirometry. I instructed the patient to contact my office if he developed any new breathing problems or have questions before his next appointment.  1. Moderate, Persistent Asthma:  Continuing Xolair as prescribed. Plan for spirometry with bronchodilator challenge & DLCO at next appointment. 2. Allergic Rhinitis: Recommended using Zyrtec on a daily basis. 3. Mild Restrictive Lung Disease: Likely secondary to underlying congestive heart failure. No need for further testing or imaging at this time. Checking 6 minute walk test on room air at next appointment. 4. Health maintenance: Patient reports he remotely received pneumonia vaccine. Influenza Vaccine November 2016, & Prevnar December 2016. 5. Follow-up: Patient to return to clinic in 6 months or sooner if needed.  Sonia Baller Ashok Cordia, M.D. [facility] Pager:  [phone] After 3pm or if no response, call  [phone] 2:51 PM 01/22/2016

## 2016-01-23 LAB — CUP PACEART REMOTE DEVICE CHECK
Battery Voltage: 2.98 V
Brady Statistic AP VP Percent: 2.05 %
Brady Statistic AS VP Percent: 97.55 %
Brady Statistic RA Percent Paced: 2.07 %
Brady Statistic RV Percent Paced: 94.16 %
HIGH POWER IMPEDANCE MEASURED VALUE: 63 Ohm
Implantable Lead Implant Date: 20160308
Implantable Lead Model: 6935
Lead Channel Impedance Value: 399 Ohm
Lead Channel Impedance Value: 399 Ohm
Lead Channel Impedance Value: 399 Ohm
Lead Channel Impedance Value: 399 Ohm
Lead Channel Impedance Value: 456 Ohm
Lead Channel Impedance Value: 608 Ohm
Lead Channel Impedance Value: 608 Ohm
Lead Channel Pacing Threshold Amplitude: 2.375 V
Lead Channel Pacing Threshold Pulse Width: 0.4 ms
Lead Channel Pacing Threshold Pulse Width: 0.6 ms
Lead Channel Sensing Intrinsic Amplitude: 13.75 mV
Lead Channel Sensing Intrinsic Amplitude: 13.75 mV
Lead Channel Setting Pacing Amplitude: 2.5 V
Lead Channel Setting Pacing Pulse Width: 0.6 ms
MDC IDC LEAD IMPLANT DT: 20160308
MDC IDC LEAD IMPLANT DT: 20160308
MDC IDC LEAD LOCATION: 753858
MDC IDC LEAD LOCATION: 753859
MDC IDC LEAD LOCATION: 753860
MDC IDC LEAD MODEL: 4598
MDC IDC MSMT BATTERY REMAINING LONGEVITY: 93 mo
MDC IDC MSMT LEADCHNL LV IMPEDANCE VALUE: 342 Ohm
MDC IDC MSMT LEADCHNL LV IMPEDANCE VALUE: 494 Ohm
MDC IDC MSMT LEADCHNL LV IMPEDANCE VALUE: 703 Ohm
MDC IDC MSMT LEADCHNL LV IMPEDANCE VALUE: 760 Ohm
MDC IDC MSMT LEADCHNL LV IMPEDANCE VALUE: 779 Ohm
MDC IDC MSMT LEADCHNL LV PACING THRESHOLD AMPLITUDE: 0.875 V
MDC IDC MSMT LEADCHNL RA PACING THRESHOLD AMPLITUDE: 0.625 V
MDC IDC MSMT LEADCHNL RA PACING THRESHOLD PULSEWIDTH: 0.4 ms
MDC IDC MSMT LEADCHNL RA SENSING INTR AMPL: 2.75 mV
MDC IDC MSMT LEADCHNL RA SENSING INTR AMPL: 2.75 mV
MDC IDC MSMT LEADCHNL RV IMPEDANCE VALUE: 494 Ohm
MDC IDC SESS DTM: 20170615083623
MDC IDC SET LEADCHNL LV PACING AMPLITUDE: 2 V
MDC IDC SET LEADCHNL RA PACING AMPLITUDE: 2 V
MDC IDC SET LEADCHNL RV PACING PULSEWIDTH: 0.8 ms
MDC IDC SET LEADCHNL RV SENSING SENSITIVITY: 0.3 mV
MDC IDC STAT BRADY AP VS PERCENT: 0.02 %
MDC IDC STAT BRADY AS VS PERCENT: 0.38 %

## 2016-01-24 ENCOUNTER — Telehealth: Payer: Self-pay | Admitting: Internal Medicine

## 2016-01-24 ENCOUNTER — Telehealth: Payer: Self-pay | Admitting: Pulmonary Disease

## 2016-01-24 ENCOUNTER — Encounter: Payer: Self-pay | Admitting: Cardiology

## 2016-01-24 ENCOUNTER — Encounter: Payer: Self-pay | Admitting: *Deleted

## 2016-01-24 DIAGNOSIS — R0602 Shortness of breath: Secondary | ICD-10-CM

## 2016-01-24 MED ORDER — METOLAZONE 2.5 MG PO TABS: ORAL_TABLET | ORAL | Status: DC

## 2016-01-24 NOTE — Telephone Encounter (Signed)
I left a message for the patient to call. 

## 2016-01-24 NOTE — Telephone Encounter (Signed)
The patient is aware of Dr. Olin Pia recommendations. He is agreeable with starting metolazone 2.5 mg as directed by Dr. Caryl Comes. He will have a repeat BMP on 6/28.

## 2016-01-24 NOTE — Telephone Encounter (Signed)
#   vials:4 Ordered date:01/24/16 Shipping Date: 01/25/16

## 2016-01-24 NOTE — Telephone Encounter (Signed)
Please prescribe metolazone 2.5 mg to take tomorrow and then again on Saturday.  We will need a metabolic profile in one week

## 2016-01-24 NOTE — Telephone Encounter (Signed)
New message      Pt c/o medication issue:  1. Name of Medication: lasix 2. How are you currently taking this medication (dosage and times per day)? 80mg  daily 3. Are you having a reaction (difficulty breathing--STAT)? no 4. What is your medication issue? Pt has gained 3 lbs, ankles/feet swelling and pt cannot take deep breath--he states he is not sob just cannot breathe deep.  Please advise

## 2016-01-24 NOTE — Telephone Encounter (Signed)
I spoke with the patient. He is currently taking lasix 80 mg once daily (around 8am-10am). He recently had a pulmonary appointment with PFT's - he tells me there is no issue with his lungs currently. He reports that he is having trouble taking a deep breath. He has gained 3 lbs over the last few 3-4 days. Baseline weight is ~202-208 per his report. He was 215 lbs today (around 213 lbs Saturday) He has been taking lasix 80 mg once daily since 11/16/15.  He states he is not "peeing" like other people do on this dose.  He is elevating the head of the bed at night, but this is not new. He is eating less salt. He is drinking water and some gatorade. Discussed sodium content in gatorade.. I advised the patient I will forward to Dr. Caryl Comes to review. He is agreeable.

## 2016-01-25 NOTE — Telephone Encounter (Signed)
#   Vials:4 Arrival Date:01-25-16 Lot [id number] Exp Date:07/2019

## 2016-01-26 ENCOUNTER — Other Ambulatory Visit: Payer: Self-pay | Admitting: Cardiovascular Disease

## 2016-01-26 MED ORDER — SACUBITRIL-VALSARTAN 49-51 MG PO TABS: 1.0000 | ORAL_TABLET | Freq: Two times a day (BID) | ORAL | Status: DC

## 2016-01-26 NOTE — Telephone Encounter (Signed)
[facility] a 90 day supply of Entresto 49/51 tablets. Resent with 90 day supply. Confirmation received.

## 2016-01-29 ENCOUNTER — Ambulatory Visit (INDEPENDENT_AMBULATORY_CARE_PROVIDER_SITE_OTHER): Payer: Medicare Other

## 2016-01-29 DIAGNOSIS — J4540 Moderate persistent asthma, uncomplicated: Secondary | ICD-10-CM

## 2016-01-30 MED ORDER — OMALIZUMAB 150 MG ~~LOC~~ SOLR
300.0000 mg | Freq: Once | SUBCUTANEOUS | Status: AC
Start: 2016-01-29 — End: 2016-01-29
  Administered 2016-01-29: 300 mg via SUBCUTANEOUS

## 2016-01-31 ENCOUNTER — Other Ambulatory Visit (INDEPENDENT_AMBULATORY_CARE_PROVIDER_SITE_OTHER): Payer: Medicare Other | Admitting: *Deleted

## 2016-01-31 DIAGNOSIS — R0602 Shortness of breath: Secondary | ICD-10-CM | POA: Diagnosis not present

## 2016-02-01 LAB — BASIC METABOLIC PANEL
BUN: 77 mg/dL — AB (ref 7–25)
CALCIUM: 8.9 mg/dL (ref 8.6–10.3)
CO2: 27 mmol/L (ref 20–31)
Chloride: 96 mmol/L — ABNORMAL LOW (ref 98–110)
Creat: 3.36 mg/dL — ABNORMAL HIGH (ref 0.70–1.11)
GLUCOSE: 109 mg/dL — AB (ref 65–99)
Potassium: 4.5 mmol/L (ref 3.5–5.3)
SODIUM: 135 mmol/L (ref 135–146)

## 2016-02-02 ENCOUNTER — Other Ambulatory Visit: Payer: Self-pay | Admitting: *Deleted

## 2016-02-02 DIAGNOSIS — R0602 Shortness of breath: Secondary | ICD-10-CM

## 2016-02-05 ENCOUNTER — Other Ambulatory Visit (INDEPENDENT_AMBULATORY_CARE_PROVIDER_SITE_OTHER): Payer: Medicare Other | Admitting: *Deleted

## 2016-02-05 DIAGNOSIS — R0602 Shortness of breath: Secondary | ICD-10-CM

## 2016-02-05 NOTE — Addendum Note (Signed)
Addended by: Eulis Foster on: 02/05/2016 03:32 PM   Modules accepted: Orders

## 2016-02-06 LAB — BASIC METABOLIC PANEL
BUN: 56 mg/dL — AB (ref 7–25)
CALCIUM: 9.1 mg/dL (ref 8.6–10.3)
CO2: 24 mmol/L (ref 20–31)
CREATININE: 2.43 mg/dL — AB (ref 0.70–1.11)
Chloride: 103 mmol/L (ref 98–110)
GLUCOSE: 125 mg/dL — AB (ref 65–99)
Potassium: 4.5 mmol/L (ref 3.5–5.3)
SODIUM: 138 mmol/L (ref 135–146)

## 2016-02-07 ENCOUNTER — Telehealth: Payer: Self-pay | Admitting: Internal Medicine

## 2016-02-07 DIAGNOSIS — N289 Disorder of kidney and ureter, unspecified: Secondary | ICD-10-CM

## 2016-02-07 NOTE — Telephone Encounter (Signed)
I called and spoke with the patient regarding his lab results. He is agreeable to renal referral. He has an appt with the CHF clinic on 03/07/16. He states today that his BP this morning was 160/80, but this is the first time it has read that high since his lasix and his entresto on 6/30. I advised him to continue to record his BP readings and I will follow up with him next Tuesday regarding the numbers he has been getting. He is agreeable.

## 2016-02-09 ENCOUNTER — Telehealth: Payer: Self-pay

## 2016-02-09 DIAGNOSIS — I255 Ischemic cardiomyopathy: Secondary | ICD-10-CM

## 2016-02-09 NOTE — Telephone Encounter (Signed)
Call back to patient.  Advised of Dr Elmarie Shiley recommendations of resuming Lasix 80 mg 1 tablet daily and repeat BMET in a week. He should not take any Metolazone. He verbalized understanding.  Advised if symptoms worsen over the weekend to use 911 if needed.  ICM remote check scheduled for 02/12/2016.

## 2016-02-09 NOTE — Telephone Encounter (Signed)
Patient referred ICM clinic by Debroah Loop, Device RN/Dr Caryl Comes.  Provided ICM introduction and he agreed to monthly calls.  He stated Lasix and Entresto was stopped by Dr Caryl Comes on 02/02/2016 due to decrease in kidney function.  He has been referred to renal physician and has CHF appointment on 03/07/2016.  He stated since stopping his Lasix, he has had a 3 lb weight gain, increase in swelling of belly and legs, and increase in SOB.  He is not in any acute distress but feels breathing is a little more labored.  He is concerned with increasing of fluid symptoms.  Weight baseline is normally around 202-208.  Weight on 01/23/2013 was 215 lbs and 213 lbs on 01/20/2016.  Advised would discuss with his primary cardiologist, Dr Acie Fredrickson in the office. Patient unable to send a remote transmission today due to he is not home.   Reviewed symptoms with Dr Acie Fredrickson.  He stated patient may resume Lasix 80 mg 1 tablet daily and repeat BMET in a week.  He should not take any Metolazone.  Patient already referred to nephrologist.

## 2016-02-12 ENCOUNTER — Ambulatory Visit (INDEPENDENT_AMBULATORY_CARE_PROVIDER_SITE_OTHER): Payer: Medicare Other

## 2016-02-12 ENCOUNTER — Telehealth: Payer: Self-pay

## 2016-02-12 DIAGNOSIS — J4540 Moderate persistent asthma, uncomplicated: Secondary | ICD-10-CM

## 2016-02-12 DIAGNOSIS — Z95810 Presence of automatic (implantable) cardiac defibrillator: Secondary | ICD-10-CM | POA: Diagnosis not present

## 2016-02-12 DIAGNOSIS — I5022 Chronic systolic (congestive) heart failure: Secondary | ICD-10-CM | POA: Diagnosis not present

## 2016-02-12 MED ORDER — OMALIZUMAB 150 MG ~~LOC~~ SOLR
300.0000 mg | Freq: Once | SUBCUTANEOUS | Status: AC
  Administered 2016-02-12: 300 mg via SUBCUTANEOUS

## 2016-02-12 NOTE — Telephone Encounter (Signed)
Attempted ICM call to patient and left message on answering machine to return call.  Requested he send remote transmission from home today.

## 2016-02-13 ENCOUNTER — Telehealth: Payer: Self-pay | Admitting: *Deleted

## 2016-02-13 NOTE — Telephone Encounter (Signed)
Patient now enrolled in [facility] clinic.  He was restarted on lasix 80 mg once daily on 02/09/16. Per Laurie's ICM note on 02/12/16, the patient's " BP recordings since 02/07/2016 ranged 156-160/60's and today was 165/64." Will forward to Dr. Acie Fredrickson for any further recommendations on the patient's BP's.   Staff message received from Vashti Hey regarding renal appt:  02/09/16 HEATHER, PER [facility] SCHEDULING WITHIN 2 MONTHS.  Received: 4 days ago    Jacqualine Code, RN

## 2016-02-13 NOTE — Progress Notes (Signed)
EPIC Encounter for ICM Monitoring  Patient Name: Shane [name] is a 82 y.o. male Date: 02/13/2016 Primary Care Physican: [name], MD Primary Cardiologist: Nahser Electrophysiologist: Caryl Comes Dry Weight: 213 lb  Bi-V Pacing:  95.6%       Heart Failure questions reviewed, pt was symptomatic on 02/09/2016 with 3 lb weight gain, increase in swelling of belly and legs, and increase in SOB. Symptoms resolved and has returned to his baseline.  2 lb weight loss since 7/72017.  BP recordings since 02/07/2016 ranged 156-160/60's and today was 165/64.  Advised will check on his nephrologist referral.  Advised to call if any further fluid symptoms.   Patient continues with recommended dosage of Furosemide 80 mg 1 tablet daily.    Thoracic impedence has returned to baseline since Dr Elmarie Shiley recommendations for him to resume Furosemide 80 mg 1 tablet daily.  Recommendations: None  ICM trend: 02/12/2016    Follow-up plan: ICM clinic phone appointment on 03/18/2016.  1st ICM encounter  Copy of ICM check sent to primary cardiologist and device physician of improved ICM transmission and fluid symptoms resolved.   Rosalene Billings, RN 02/13/2016 10:15 AM

## 2016-02-13 NOTE — Telephone Encounter (Signed)
02/07/16 All Information faxed to [facility].  Received: 6 days ago    Jacqualine Code, RN      02/09/16 HEATHER, PER [facility] SCHEDULING WITHIN 2 MONTHS.  Received: 4 days ago    Jacqualine Code, RN

## 2016-02-14 NOTE — Telephone Encounter (Signed)
Forwarding to Dr. Acie Fredrickson for advice regarding BP and medications.  I discussed case with Dr. Caryl Comes and his primary nurse, [name], RN and all are in agreement that Dr. Acie Fredrickson will manage these issues moving forward.

## 2016-02-15 ENCOUNTER — Other Ambulatory Visit (INDEPENDENT_AMBULATORY_CARE_PROVIDER_SITE_OTHER): Payer: Medicare Other | Admitting: *Deleted

## 2016-02-15 ENCOUNTER — Telehealth: Payer: Self-pay | Admitting: Pulmonary Disease

## 2016-02-15 DIAGNOSIS — I255 Ischemic cardiomyopathy: Secondary | ICD-10-CM

## 2016-02-15 NOTE — Telephone Encounter (Signed)
His mildly elevated BP is ok for now. I do not want to change his meds at this point

## 2016-02-15 NOTE — Telephone Encounter (Signed)
#   vials:4 Ordered date:02/15/16 Shipping Date:02/15/16

## 2016-02-15 NOTE — Telephone Encounter (Signed)
Spoke with patient and advised him that Dr. Acie Fredrickson does not want to change any medications at this time.  He requests to get his lab work done today instead of tomorrow since he is already in town.  I have changed the lab appointment in epic and advised I will call him with results when available.  He verbalized understanding and agreement.

## 2016-02-16 ENCOUNTER — Other Ambulatory Visit: Payer: Medicare Other

## 2016-02-16 ENCOUNTER — Telehealth: Payer: Self-pay | Admitting: Nurse Practitioner

## 2016-02-16 DIAGNOSIS — I10 Essential (primary) hypertension: Secondary | ICD-10-CM

## 2016-02-16 LAB — BASIC METABOLIC PANEL WITH GFR
BUN: 55 mg/dL — ABNORMAL HIGH (ref 7–25)
CO2: 24 mmol/L (ref 20–31)
Calcium: 8.8 mg/dL (ref 8.6–10.3)
Chloride: 97 mmol/L — ABNORMAL LOW (ref 98–110)
Creat: 2.72 mg/dL — ABNORMAL HIGH (ref 0.70–1.11)
GFR, EST AFRICAN AMERICAN: 24 mL/min — AB (ref >=60)
GFR, Est Non African American: 21 mL/min — ABNORMAL LOW (ref >=60)
GLUCOSE: 94 mg/dL (ref 65–99)
POTASSIUM: 3.9 mmol/L (ref 3.5–5.3)
Sodium: 135 mmol/L (ref 135–146)

## 2016-02-16 NOTE — Telephone Encounter (Signed)
#   Vials:4 Arrival Date:02/16/16 Lot [id number] Exp Date:12/20

## 2016-02-16 NOTE — Telephone Encounter (Signed)
Reviewed results and plan of care with patient who verbalized understanding.  He has not received a call to schedule nephrology appointment yet.  I advised him I would send a message to scheduling.  Lab appointment is scheduled for 8/18 and I advised that if nephrology has done lab work prior to or around that date, that we will cancel appointment.  He verbalized understanding and agreement.

## 2016-02-16 NOTE — Telephone Encounter (Signed)
-----   Message from Thayer Headings, MD sent at 02/16/2016  9:31 AM EDT ----- Creatinine is up slightly . As long has he is doing well clinically, we will continue with current meds . He sees nephrology . Recheck in 6 weeks.

## 2016-02-21 DIAGNOSIS — N400 Enlarged prostate without lower urinary tract symptoms: Secondary | ICD-10-CM | POA: Diagnosis not present

## 2016-02-21 DIAGNOSIS — E291 Testicular hypofunction: Secondary | ICD-10-CM | POA: Diagnosis not present

## 2016-02-22 ENCOUNTER — Encounter (HOSPITAL_BASED_OUTPATIENT_CLINIC_OR_DEPARTMENT_OTHER): Payer: Medicare Other

## 2016-02-26 ENCOUNTER — Ambulatory Visit (INDEPENDENT_AMBULATORY_CARE_PROVIDER_SITE_OTHER): Payer: Medicare Other

## 2016-02-26 DIAGNOSIS — J4540 Moderate persistent asthma, uncomplicated: Secondary | ICD-10-CM

## 2016-02-28 MED ORDER — OMALIZUMAB 150 MG ~~LOC~~ SOLR
300.0000 mg | Freq: Once | SUBCUTANEOUS | Status: AC
  Administered 2016-02-26: 300 mg via SUBCUTANEOUS

## 2016-03-06 DIAGNOSIS — I255 Ischemic cardiomyopathy: Secondary | ICD-10-CM | POA: Diagnosis not present

## 2016-03-06 DIAGNOSIS — E669 Obesity, unspecified: Secondary | ICD-10-CM | POA: Diagnosis not present

## 2016-03-06 DIAGNOSIS — N183 Chronic kidney disease, stage 3 (moderate): Secondary | ICD-10-CM | POA: Diagnosis not present

## 2016-03-06 DIAGNOSIS — I10 Essential (primary) hypertension: Secondary | ICD-10-CM | POA: Diagnosis not present

## 2016-03-06 DIAGNOSIS — E119 Type 2 diabetes mellitus without complications: Secondary | ICD-10-CM | POA: Diagnosis not present

## 2016-03-07 ENCOUNTER — Ambulatory Visit (HOSPITAL_COMMUNITY)
Admission: RE | Admit: 2016-03-07 | Discharge: 2016-03-07 | Disposition: A | Discharge status: Home / Self Care | Payer: Medicare Other | Source: Ambulatory Visit | Attending: Internal Medicine | Admitting: Internal Medicine

## 2016-03-07 ENCOUNTER — Encounter (HOSPITAL_COMMUNITY): Payer: Self-pay | Admitting: Internal Medicine

## 2016-03-07 VITALS — BP 134/68 | HR 63 | Ht 70.5 in | Wt 215.0 lb

## 2016-03-07 DIAGNOSIS — I5022 Chronic systolic (congestive) heart failure: Secondary | ICD-10-CM | POA: Diagnosis not present

## 2016-03-07 DIAGNOSIS — I10 Essential (primary) hypertension: Secondary | ICD-10-CM | POA: Diagnosis not present

## 2016-03-07 LAB — BASIC METABOLIC PANEL
ANION GAP: 6 (ref 5–15)
BUN: 45 mg/dL — ABNORMAL HIGH (ref 6–20)
CO2: 30 mmol/L (ref 22–32)
Calcium: 9.3 mg/dL (ref 8.9–10.3)
Chloride: 101 mmol/L (ref 101–111)
Creatinine, Ser: 2.47 mg/dL — ABNORMAL HIGH (ref 0.61–1.24)
GFR calc non Af Amer: 23 mL/min — ABNORMAL LOW (ref >60)
GFR, EST AFRICAN AMERICAN: 27 mL/min — AB (ref >60)
GLUCOSE: 114 mg/dL — AB (ref 65–99)
POTASSIUM: 4.1 mmol/L (ref 3.5–5.1)
Sodium: 137 mmol/L (ref 135–145)

## 2016-03-07 LAB — BRAIN NATRIURETIC PEPTIDE: B Natriuretic Peptide: 127.6 pg/mL — ABNORMAL HIGH (ref 0.0–100.0)

## 2016-03-07 MED ORDER — FUROSEMIDE 80 MG PO TABS: ORAL_TABLET | ORAL | 6 refills | Status: DC

## 2016-03-07 NOTE — Progress Notes (Signed)
Patient ID: Shane [name], male   DOB: [birth year], 82 y.o.   MRN: [id number]   ADVANCED HF CLINIC NOTE   Date:  03/07/2016   ID:  Shane [name], DOB Sep 07, 1933, MRN [id number]  PCP:  [name], MD  Cardiologist:   Nahser    History of Present Illness: Shane [name] is a 82 y.o. male with CAD s/p CABG, systolic HF EF [id number] and CKD 4 referred to the HF Clinic by Dr. Acie Fredrickson.   He is retired from [employer]. Says he was doing fine until March or April when he started having more DOE. Felt he couldn't breath as deeply. MDT ICD was optimized. Took him off Toprol and switched to carvedilol. Also started Entresto. Began to gain fluid. Then lasix was started. (this was new). Entresto stopped. Creatine went from 1.9 to 3.3 then back to to 2.7  Saw Dr. Hollie Salk at [facility] yesterday who felt he was dry. (no labs drawn)  and lasix decreased from 80 to 40 and metformin stopped  Says he has gotten some of the fluid off (down 3-5 pounds) but says he feels he has another 5 pounds to go. 2 pillow orthopnea. No PND. No CP. Limited by left foot. Denies DOE unless he walks along way or up steps. Riding exercise bike 45 min/day  Past Medical History:  Diagnosis Date  . Abnormal nuclear cardiac imaging test Nov 2010   moderate area of infarct in the inferior wall with only minimal reversibility and EF of 28%  . Allergic rhinitis 01-08-13   Uses nebulizer for chronic sinus issues and Mucinex.  . Arthritis    osteoarthritis   . Blood transfusion    ? at time of bypass surgery   . BPH (benign prostatic hyperplasia)   . Cellulitis of arm, left    MSSA  . CKD (chronic kidney disease), stage III    "was told due to meds he takes"-no Renal workup done  . Colon polyps    adenomatous  . Coronary artery disease    remote CABG in 1985, cath in 2003 by Dr. Lia Foyer with no PCI, last nuclear in 2010 showing scar and EF of 28%.   . Diabetes mellitus   . Dyslipidemia   . Glaucoma 01-08-13   tx. eye drops  . HTN (hypertension)   . Hypercholesterolemia   . Left ventricular dysfunction    28% per nuclear in 2010 and 35 to 40% per echo in 2010  . Myocardial infarction ([place])    1985  . Neuromuscular disorder ([place])    legs mild paralysis-able to walk"nerve damage"-legs- left leg brace   . Pneumonia    hx of several times years ago   . Spinal stenosis     Past Surgical History:  Procedure Laterality Date  . BACK SURGERY     hx of back surgery x 4   . BI-VENTRICULAR IMPLANTABLE CARDIOVERTER DEFIBRILLATOR N/A 10/10/2014   Procedure: BI-VENTRICULAR IMPLANTABLE CARDIOVERTER DEFIBRILLATOR  (CRT-D);  Surgeon: [name], MD;  Location: [facility] C[facility] LAB;  Service: Cardiovascular;  Laterality: N/A;  . CARDIAC CATHETERIZATION  2003  . Cataract      recent cataract surgery 6'14  . COLONOSCOPY N/A 01/25/2013   Procedure: COLONOSCOPY;  Surgeon: Irene Shipper, MD;  Location: [facility] ENDOSCOPY;  Service: Endoscopy;  Laterality: N/A;  . CORONARY ARTERY BYPASS GRAFT  1985   x 5 vessels  . LUMBAR DISC SURGERY    . PR POLYSOM 6/>YRS SLEEP 4/>  ADDL PARAM ATTND  12/07/2015  . PROSTATE SURGERY    . TOTAL KNEE ARTHROPLASTY  08/01/2011   Procedure: TOTAL KNEE ARTHROPLASTY;  Surgeon: Johnn Hai;  Location: [facility] ORS;  Service: Orthopedics;  Laterality: Right;     Current Outpatient Prescriptions  Medication Sig Dispense Refill  . acetaminophen (TYLENOL) 500 MG tablet Take 1,000 mg by mouth as needed for pain.     Marland Kitchen amLODipine (NORVASC) 10 MG tablet Take 10 mg by mouth daily.    Marland Kitchen aspirin EC 81 MG tablet Take 81 mg by mouth every morning.    Marland Kitchen atorvastatin (LIPITOR) 10 MG tablet Take 10 mg by mouth daily.    Marland Kitchen b complex vitamins tablet Take 1 tablet by mouth daily.    . carvedilol (COREG) 25 MG tablet Take 1 tablet (25 mg total) by mouth 2 (two) times daily. 60 tablet 11  . Cholecalciferol (VITAMIN D-3) 1000 UNITS CAPS Take 2,000 Units by mouth daily.    Marland Kitchen CIALIS 20 MG tablet Take 20 mg by mouth  daily as needed for erectile dysfunction.     . diphenhydrAMINE (BENADRYL) 25 mg capsule Take 25 mg by mouth every 6 (six) hours as needed for allergies.     . fluocinonide cream (LIDEX) [id number] % Apply 1 application topically 2 (two) times daily.    . furosemide (LASIX) 80 MG tablet Take 1 tablet (80 mg total) by mouth daily. (Patient taking differently: Take 40 mg by mouth daily. ) 30 tablet 6  . Multiple Vitamin (MULTIVITAMIN) tablet Take 1 tablet by mouth daily.    Marland Kitchen omalizumab (XOLAIR) 150 MG injection Inject 150 mg into the skin every 14 (fourteen) days.    Marland Kitchen testosterone cypionate (DEPOTESTOTERONE CYPIONATE) 100 MG/ML injection Inject 400 mg into the muscle every 21 ( twenty-one) days. For IM use only    . triamcinolone (NASACORT AQ) 55 MCG/ACT AERO nasal inhaler Place 2 sprays into the nose daily.    Marland Kitchen zolpidem (AMBIEN) 10 MG tablet Take 1/2 to 1 tablet at bedtime as needed for sleep. 30 tablet 2   No current facility-administered medications for this encounter.     Allergies:   Codeine; Hydrocodone; and Azithromycin    Social History:  The patient  reports that he quit smoking about 57 years ago. His smoking use included Cigarettes, Pipe, and Cigars. He has a 5.00 pack-year smoking history. He has never used smokeless tobacco. He reports that he does not drink alcohol or use drugs.   Family History:  The patient's family history includes Bone cancer in his brother; Colon polyps in his father; Diabetes in his sister; Heart attack in his father; Heart disease in his brother and father; Lung cancer in his mother.    ROS:  Please see the history of present illness.   PHYSICAL EXAM: VS:  BP 134/68 (BP Location: Right Arm, Patient Position: Sitting, Cuff Size: Normal)   Pulse 63   Ht 5' 10.5" (1.791 m)   Wt 215 lb (97.5 kg)   SpO2 94%   BMI 30.41 kg/m  , BMI Body mass index is 30.41 kg/m.  GEN: Elderly obese male, in no acute distress  HEENT: normal  Neck: JVP 7  Possible soft  right carotid bruit, or masses Cardiac: RRR; no murmurs, rubs, or gallops, trace edema  Respiratory:  clear to auscultation bilaterally, normal work of breathing GI: soft, nontender, nondistended, + BS MS: no deformity or atrophy  Skin: warm and dry, no rash Neuro:  Strength  and sensation are intact Psych: normal   Recent Labs: 09/18/2015: ALT 15 12/07/2015: Hemoglobin 14.0; Platelets 118.0 02/15/2016: BUN 55; Creat 2.72; Potassium 3.9; Sodium 135    Lipid Panel    Component Value Date/Time   CHOL 104 (L) 09/18/2015 0955   TRIG 97 09/18/2015 0955   HDL 25 (L) 09/18/2015 0955   CHOLHDL 4.2 09/18/2015 0955   VLDL 19 09/18/2015 0955   LDLCALC 60 09/18/2015 0955      Wt Readings from Last 3 Encounters:  03/07/16 215 lb (97.5 kg)  01/22/16 225 lb (102.1 kg)  12/07/15 222 lb (100.7 kg)      ASSESSMENT AND PLAN:  1. Chronic systolic congestive heart failure-due to ischemic cardiomyopathy. EF 40-45% echo 4/17. S/p ICD --Chronic NYHA II-III --I think fluid status looks quite good. Lasix recently (yesterday decreased fro 80 to 40) will check labs today. Continue lasix 40 daily. Continue weighing every day. If weight going back up can increase lasix to 80- mg alternating with 40.  --May benefit from hydralazine in future --Continue b-blocker --No ACE, ARB, ARNI due to CKD.  2. Coronary artery disease - history of remote coronary artery bypass grafting 1985   --last cath 2003 LM and RCA occluded. LIMA to LAD ok. RIMA to OM ok  SVG to PDA occluded. EF 45% 3. CKD 4, --followed by Dr. Hollie Salk in Nephrology 4. Hyperlipidemia-  5. Diabetes mellitus  6. Mild carotid artery disease.  - He has a very soft bruit. He had a duplex scan in 2007 which revealed mild carotid disease. He has a repeat pending  Glori Bickers, MD

## 2016-03-07 NOTE — Patient Instructions (Signed)
Increase Lasix to 80mg  every other day alternating with 40mg  every other day.  Routine lab work today. Will notify you of abnormal results  Follow up with Dr.Bensimhon in 1 month

## 2016-03-11 ENCOUNTER — Ambulatory Visit (INDEPENDENT_AMBULATORY_CARE_PROVIDER_SITE_OTHER): Payer: Medicare Other

## 2016-03-11 DIAGNOSIS — J4540 Moderate persistent asthma, uncomplicated: Secondary | ICD-10-CM

## 2016-03-12 MED ORDER — OMALIZUMAB 150 MG ~~LOC~~ SOLR
300.0000 mg | SUBCUTANEOUS | Status: DC
  Administered 2016-03-11 – 2016-03-25 (×2): 300 mg via SUBCUTANEOUS

## 2016-03-13 ENCOUNTER — Telehealth: Payer: Self-pay | Admitting: Pulmonary Disease

## 2016-03-13 NOTE — Telephone Encounter (Signed)
#   vials:4 Ordered date:03/13/16 Shipping Date:03/14/16

## 2016-03-14 DIAGNOSIS — E291 Testicular hypofunction: Secondary | ICD-10-CM | POA: Diagnosis not present

## 2016-03-14 NOTE — Telephone Encounter (Signed)
#   Vials:4 Arrival Date:03/14/16 Lot [id number] Exp Date:3/21

## 2016-03-18 ENCOUNTER — Ambulatory Visit (INDEPENDENT_AMBULATORY_CARE_PROVIDER_SITE_OTHER): Payer: Medicare Other

## 2016-03-18 DIAGNOSIS — Z95810 Presence of automatic (implantable) cardiac defibrillator: Secondary | ICD-10-CM

## 2016-03-18 DIAGNOSIS — I5022 Chronic systolic (congestive) heart failure: Secondary | ICD-10-CM

## 2016-03-18 NOTE — Progress Notes (Signed)
EPIC Encounter for ICM Monitoring  Patient Name: Shane [name] is a 82 y.o. male Date: 03/18/2016 Primary Care Physican: [name], MD Primary Cardiologist: Nahser/Bensimhon Electrophysiologist: Caryl Comes Nephrologist: Hollie Salk at [facility]  Dry Weight: 210 lb  Bi-V Pacing:  95.9%       Heart Failure questions reviewed, pt symptomatic with belly swelling and feeling bloated.   Dr Hollie Salk, Nephrologist has decreased Furosemide to 40 mg daily but Dr Haroldine Laws has given him permission to adjust the Furosemide if he is retaining fluid.    For the last few days he has been taking Furosemide 80 mg daily which correlates with thoracic impedance.   Thoracic impedance abnormal suggesting fluid accumulation starting 03/08/2016 and started returning to baseline 03/15/2016 since he has increased Furosemide a few days.  Recommendations:  Patient is adjusting Furosemide for fluid symptoms as instructed by Dr Haroldine Laws.    No changes.  Advised to call should if fluid symptoms are not resolved by Furosemide.  Reminded patient he has lab appointment for 03/22/2016 to recheck BMET.  Patient's wife just had total knee replacement and he has been busy taking her to therapy.   Follow-up plan: ICM clinic phone appointment on 04/18/2016.  Copy of ICM check sent to primary cardiologist and device physician.   ICM trend: 03/18/2016       Rosalene Billings, RN 03/18/2016 10:34 AM

## 2016-03-20 ENCOUNTER — Ambulatory Visit (HOSPITAL_BASED_OUTPATIENT_CLINIC_OR_DEPARTMENT_OTHER): Payer: Medicare Other | Attending: Cardiology

## 2016-03-22 ENCOUNTER — Other Ambulatory Visit: Payer: Medicare Other

## 2016-03-25 ENCOUNTER — Other Ambulatory Visit: Payer: Medicare Other

## 2016-03-25 ENCOUNTER — Ambulatory Visit (INDEPENDENT_AMBULATORY_CARE_PROVIDER_SITE_OTHER): Payer: Medicare Other

## 2016-03-25 DIAGNOSIS — J4540 Moderate persistent asthma, uncomplicated: Secondary | ICD-10-CM | POA: Diagnosis not present

## 2016-03-26 ENCOUNTER — Other Ambulatory Visit (INDEPENDENT_AMBULATORY_CARE_PROVIDER_SITE_OTHER): Payer: Medicare Other

## 2016-03-26 DIAGNOSIS — I10 Essential (primary) hypertension: Secondary | ICD-10-CM | POA: Diagnosis not present

## 2016-03-26 LAB — BASIC METABOLIC PANEL
BUN: 35 mg/dL — ABNORMAL HIGH (ref 7–25)
CO2: 27 mmol/L (ref 20–31)
Calcium: 8.8 mg/dL (ref 8.6–10.3)
Chloride: 103 mmol/L (ref 98–110)
Creat: 2.28 mg/dL — ABNORMAL HIGH (ref 0.70–1.11)
Glucose, Bld: 96 mg/dL (ref 65–99)
POTASSIUM: 4.2 mmol/L (ref 3.5–5.3)
SODIUM: 140 mmol/L (ref 135–146)

## 2016-03-26 MED ORDER — OMALIZUMAB 150 MG ~~LOC~~ SOLR: 300.0000 mg | SUBCUTANEOUS | Status: DC

## 2016-04-03 NOTE — Addendum Note (Signed)
Encounter addended by: Jolaine Artist, MD on: 04/03/2016 10:50 PM<BR>    Actions taken: LOS modified, Visit diagnoses modified

## 2016-04-04 DIAGNOSIS — E291 Testicular hypofunction: Secondary | ICD-10-CM | POA: Diagnosis not present

## 2016-04-05 ENCOUNTER — Other Ambulatory Visit: Payer: Self-pay

## 2016-04-09 ENCOUNTER — Ambulatory Visit (INDEPENDENT_AMBULATORY_CARE_PROVIDER_SITE_OTHER): Payer: Medicare Other

## 2016-04-09 DIAGNOSIS — J4540 Moderate persistent asthma, uncomplicated: Secondary | ICD-10-CM | POA: Diagnosis not present

## 2016-04-10 MED ORDER — OMALIZUMAB 150 MG ~~LOC~~ SOLR
300.0000 mg | SUBCUTANEOUS | Status: DC
  Administered 2016-04-09: 300 mg via SUBCUTANEOUS

## 2016-04-11 ENCOUNTER — Telehealth: Payer: Self-pay | Admitting: Pulmonary Disease

## 2016-04-11 NOTE — Telephone Encounter (Addendum)
#   vials:4  Ordered date:04/10/16 Shipping Date:04/10/16 The sheet wasn't given to me until today.

## 2016-04-11 NOTE — Telephone Encounter (Signed)
#   Vials:4 Arrival Date:9//7/17 Lot #:[id number] Exp Date:3/21

## 2016-04-18 ENCOUNTER — Ambulatory Visit (INDEPENDENT_AMBULATORY_CARE_PROVIDER_SITE_OTHER): Payer: Medicare Other | Admitting: *Deleted

## 2016-04-18 DIAGNOSIS — Z95810 Presence of automatic (implantable) cardiac defibrillator: Secondary | ICD-10-CM

## 2016-04-18 DIAGNOSIS — I5022 Chronic systolic (congestive) heart failure: Secondary | ICD-10-CM

## 2016-04-18 DIAGNOSIS — I255 Ischemic cardiomyopathy: Secondary | ICD-10-CM

## 2016-04-18 NOTE — Progress Notes (Signed)
Remote ICD transmission.   

## 2016-04-18 NOTE — Progress Notes (Signed)
EPIC Encounter for ICM Monitoring  Patient Name: Shane [name] is a 82 y.o. male Date: 04/18/2016 Primary Care Physican: [name], MD Primary Cardiologist: Nahser/Bensimhon Electrophysiologist: Caryl Comes Nephrologist: Hollie Salk at [facility]  Dry Weight:    218 lb  Bi-V Pacing:  94.8%                Heart Failure questions reviewed, pt symptomatic with weight gain and shortness of breath. Weight has increased several pounds in last week and 8 pounds in last month.    Thoracic impedance abnormal suggesting fluid accumulation since 03/12/2016.  Patient has never decreased the Furosemide dosage as recommended and continues to take 80 mg daily.   On 8/14, patient reported Dr Hollie Salk had decreased the Furosemide to 40 mg daily but he said Dr Haroldine Laws had told him he could take an extra if needed for fluid.  Patient never decreased his dosage and has stayed on 80 mg daily.   Discussed low salt diet and he stated they do eat out a lot but he has done that for many years.  Explained the heart cannot compensate for the amount of salt he may be eating.  Advised to limit to 2000 mg a day.  [place] beach.                LABS:       03/27/2016 Creatinine 2.28, BUN 35, Potassium 4.2, Sodium 140             Recommendations: Copy of ICM check sent to Dr Haroldine Laws and Dr Caryl Comes for recommendations.     Follow-up plan: ICM clinic phone appointment on 05/09/2016.  Office appointment with Dr Haroldine Laws 04/24/2016   ICM trend: 04/18/2016       Rosalene Billings, RN 04/18/2016 3:28 PM

## 2016-04-19 ENCOUNTER — Encounter: Payer: Self-pay | Admitting: Cardiology

## 2016-04-19 MED ORDER — METOLAZONE 5 MG PO TABS: 2.5000 mg | ORAL_TABLET | ORAL | 0 refills | Status: DC

## 2016-04-19 NOTE — Addendum Note (Signed)
Addended by: Emmaline Life on: 04/19/2016 04:06 PM   Modules accepted: Orders

## 2016-04-19 NOTE — Progress Notes (Signed)
Called and advised patient per Dr. Acie Fredrickson to take 2.5 mg metolazone tomorrow (Saturday) and to continue 80 mg Lasix.  He is to send another transmission on Tuesday 9/19.  I advised him to call the on call provider this weekend if he does not notice increased urinary output.

## 2016-04-19 NOTE — Progress Notes (Signed)
Attempted to contact Dr Haroldine Laws for recommendation.  Call to HF PA, Amy Clegg and she was not available for recommendation due to she was not in the office.  Discussed recommendation with Dr Elmarie Shiley nurse and Dr Acie Fredrickson provided recommendation.  Dr Elmarie Shiley nurse, Sharyn Lull, notified patient of recommendation and will repeat ICM remote transmission on 04/23/2016,

## 2016-04-23 ENCOUNTER — Ambulatory Visit (INDEPENDENT_AMBULATORY_CARE_PROVIDER_SITE_OTHER): Payer: Medicare Other

## 2016-04-23 ENCOUNTER — Telehealth: Payer: Self-pay | Admitting: Cardiology

## 2016-04-23 DIAGNOSIS — E291 Testicular hypofunction: Secondary | ICD-10-CM | POA: Diagnosis not present

## 2016-04-23 DIAGNOSIS — I5022 Chronic systolic (congestive) heart failure: Secondary | ICD-10-CM

## 2016-04-23 DIAGNOSIS — J4540 Moderate persistent asthma, uncomplicated: Secondary | ICD-10-CM

## 2016-04-23 DIAGNOSIS — Z95810 Presence of automatic (implantable) cardiac defibrillator: Secondary | ICD-10-CM

## 2016-04-23 NOTE — Telephone Encounter (Signed)
Spoke with pt and reminded pt of remote transmission that is due today. Pt verbalized understanding.   

## 2016-04-23 NOTE — Progress Notes (Signed)
EPIC Encounter for ICM Monitoring  Patient Name: Shane [name] is a 82 y.o. male Date: 04/23/2016 Primary Care Physican: [name], MD Primary Cardiologist:Nahser/Bensimhon Electrophysiologist: Caryl Comes Nephrologist: Hollie Salk at [facility] Dry Weight: unknown Bi-V Pacing:  95.1%       Heart Failure questions reviewed.  Metolazone resolved SOB and weight gain.  He reported losing weight but did not remember the exact weight.    Thoracic impedance returned to normal after taking 1 Metolazone 1/2 tablet (2.5 mg total) on 04/18/2016 as ordered by Dr Acie Fredrickson.  Recommendations: No changes.  Patient asked if the Furosemide is no longer working and explained if he is eating high salt diet then the Furosemide cannot eliminate all the fluid.  Advised him to ask Dr Haroldine Laws tomorrow about a plan for fluid overload such as can he take Metolazone as needed.   Advised to call my direct number if he has any further fluid symptoms.      Follow-up plan: ICM clinic phone appointment on 05/28/2016.  Office appointment with Dr Haroldine Laws on 04/24/2016.  Copy of ICM check sent to primary cardiologist and device physician.   ICM trend: 04/23/2016       Rosalene Billings, RN 04/23/2016 12:40 PM

## 2016-04-24 ENCOUNTER — Ambulatory Visit (HOSPITAL_COMMUNITY)
Admission: RE | Admit: 2016-04-24 | Discharge: 2016-04-24 | Disposition: A | Discharge status: Home / Self Care | Payer: Medicare Other | Source: Ambulatory Visit | Attending: Internal Medicine | Admitting: Internal Medicine

## 2016-04-24 ENCOUNTER — Encounter (HOSPITAL_COMMUNITY): Payer: Self-pay | Admitting: Internal Medicine

## 2016-04-24 VITALS — BP 142/76 | HR 80 | Wt 222.5 lb

## 2016-04-24 DIAGNOSIS — E785 Hyperlipidemia, unspecified: Secondary | ICD-10-CM | POA: Insufficient documentation

## 2016-04-24 DIAGNOSIS — N400 Enlarged prostate without lower urinary tract symptoms: Secondary | ICD-10-CM | POA: Diagnosis not present

## 2016-04-24 DIAGNOSIS — M1990 Unspecified osteoarthritis, unspecified site: Secondary | ICD-10-CM | POA: Diagnosis not present

## 2016-04-24 DIAGNOSIS — I130 Hypertensive heart and chronic kidney disease with heart failure and stage 1 through stage 4 chronic kidney disease, or unspecified chronic kidney disease: Secondary | ICD-10-CM | POA: Insufficient documentation

## 2016-04-24 DIAGNOSIS — I2510 Atherosclerotic heart disease of native coronary artery without angina pectoris: Secondary | ICD-10-CM | POA: Diagnosis not present

## 2016-04-24 DIAGNOSIS — I10 Essential (primary) hypertension: Secondary | ICD-10-CM | POA: Diagnosis not present

## 2016-04-24 DIAGNOSIS — H409 Unspecified glaucoma: Secondary | ICD-10-CM | POA: Diagnosis not present

## 2016-04-24 DIAGNOSIS — N184 Chronic kidney disease, stage 4 (severe): Secondary | ICD-10-CM | POA: Diagnosis not present

## 2016-04-24 DIAGNOSIS — Z96651 Presence of right artificial knee joint: Secondary | ICD-10-CM | POA: Diagnosis not present

## 2016-04-24 DIAGNOSIS — Z86010 Personal history of colonic polyps: Secondary | ICD-10-CM | POA: Diagnosis not present

## 2016-04-24 DIAGNOSIS — Z7984 Long term (current) use of oral hypoglycemic drugs: Secondary | ICD-10-CM | POA: Insufficient documentation

## 2016-04-24 DIAGNOSIS — L03114 Cellulitis of left upper limb: Secondary | ICD-10-CM | POA: Diagnosis not present

## 2016-04-24 DIAGNOSIS — N289 Disorder of kidney and ureter, unspecified: Secondary | ICD-10-CM

## 2016-04-24 DIAGNOSIS — E7800 Pure hypercholesterolemia, unspecified: Secondary | ICD-10-CM | POA: Diagnosis not present

## 2016-04-24 DIAGNOSIS — A4901 Methicillin susceptible Staphylococcus aureus infection, unspecified site: Secondary | ICD-10-CM | POA: Insufficient documentation

## 2016-04-24 DIAGNOSIS — E1122 Type 2 diabetes mellitus with diabetic chronic kidney disease: Secondary | ICD-10-CM | POA: Diagnosis not present

## 2016-04-24 DIAGNOSIS — J309 Allergic rhinitis, unspecified: Secondary | ICD-10-CM | POA: Insufficient documentation

## 2016-04-24 DIAGNOSIS — Z7982 Long term (current) use of aspirin: Secondary | ICD-10-CM | POA: Insufficient documentation

## 2016-04-24 DIAGNOSIS — I5022 Chronic systolic (congestive) heart failure: Secondary | ICD-10-CM | POA: Diagnosis not present

## 2016-04-24 DIAGNOSIS — I255 Ischemic cardiomyopathy: Secondary | ICD-10-CM | POA: Diagnosis not present

## 2016-04-24 DIAGNOSIS — I25810 Atherosclerosis of coronary artery bypass graft(s) without angina pectoris: Secondary | ICD-10-CM | POA: Diagnosis not present

## 2016-04-24 LAB — BASIC METABOLIC PANEL
Anion gap: 7 (ref 5–15)
BUN: 38 mg/dL — AB (ref 6–20)
CALCIUM: 8.7 mg/dL — AB (ref 8.9–10.3)
CO2: 30 mmol/L (ref 22–32)
Chloride: 101 mmol/L (ref 101–111)
Creatinine, Ser: 2.46 mg/dL — ABNORMAL HIGH (ref 0.61–1.24)
GFR calc Af Amer: 27 mL/min — ABNORMAL LOW (ref >60)
GFR, EST NON AFRICAN AMERICAN: 23 mL/min — AB (ref >60)
GLUCOSE: 138 mg/dL — AB (ref 65–99)
Potassium: 3.9 mmol/L (ref 3.5–5.1)
SODIUM: 138 mmol/L (ref 135–145)

## 2016-04-24 MED ORDER — SACUBITRIL-VALSARTAN 24-26 MG PO TABS: 1.0000 | ORAL_TABLET | Freq: Two times a day (BID) | ORAL | 3 refills | Status: DC

## 2016-04-24 MED ORDER — OMALIZUMAB 150 MG ~~LOC~~ SOLR: 150.0000 mg | SUBCUTANEOUS | Status: DC

## 2016-04-24 MED ORDER — OMALIZUMAB 150 MG ~~LOC~~ SOLR
300.0000 mg | SUBCUTANEOUS | Status: DC
  Administered 2016-04-24: 300 mg via SUBCUTANEOUS

## 2016-04-24 NOTE — Patient Instructions (Signed)
Start Entresto 24/26 mg Twice daily   Lab today  Lab in 1 week  Your physician recommends that you schedule a follow-up appointment in: 6 weeks

## 2016-04-24 NOTE — Progress Notes (Signed)
Patient ID: Shane [name], male   DOB: 1934-01-30, 82 y.o.   MRN: [id number]   ADVANCED HF CLINIC NOTE   Date:  04/24/2016   ID:  Shane [name], DOB Nov 21, 1933, MRN [id number]  PCP:  [name], MD  Cardiologist:   Nahser    History of Present Illness: Shane [name] is a 82 y.o. male with CAD s/p CABG, systolic HF EF 08-65% and CKD 4 referred to the HF Clinic by Dr. Acie Fredrickson.   He is retired from [employer]. Says he was doing fine until March or April when he started having more DOE. Felt he couldn't breath as deeply. MDT ICD was optimized. Took him off Toprol and switched to carvedilol. Also started Entresto. Began to gain fluid. Then lasix was started. (this was new). Entresto stopped. Creatine went from 1.9 to 3.3 then back to to 2.7  Saw Dr. Hollie Salk at [facility] in 8/17 who felt he was dry and lasix decreased from 80 to 40. Quickly started regaining flid and went back to 80 daily but fluid stayed up. Over this past weekend took metolazone 2.5 and lost 3-4 pounds. Weight at home 212.5 Chronic. 2 pillow orthopnea. No PND. No CP. Limited by left foot. Denies DOE unless he walks along way or up steps. Riding exercise bike 45 min/day  Past Medical History:  Diagnosis Date  . Abnormal nuclear cardiac imaging test Nov 2010   moderate area of infarct in the inferior wall with only minimal reversibility and EF of 28%  . Allergic rhinitis 01-08-13   Uses nebulizer for chronic sinus issues and Mucinex.  . Arthritis    osteoarthritis   . Blood transfusion    ? at time of bypass surgery   . BPH (benign prostatic hyperplasia)   . Cellulitis of arm, left    MSSA  . CKD (chronic kidney disease), stage III    "was told due to meds he takes"-no Renal workup done  . Colon polyps    adenomatous  . Coronary artery disease    remote CABG in 1985, cath in 2003 by Dr. Lia Foyer with no PCI, last nuclear in 2010 showing scar and EF of 28%.   . Diabetes mellitus   . Dyslipidemia   .  Glaucoma 01-08-13   tx. eye drops  . HTN (hypertension)   . Hypercholesterolemia   . Left ventricular dysfunction    28% per nuclear in 2010 and 35 to 40% per echo in 2010  . Myocardial infarction ([place])    1985  . Neuromuscular disorder ([place])    legs mild paralysis-able to walk"nerve damage"-legs- left leg brace   . Pneumonia    hx of several times years ago   . Spinal stenosis     Past Surgical History:  Procedure Laterality Date  . BACK SURGERY     hx of back surgery x 4   . BI-VENTRICULAR IMPLANTABLE CARDIOVERTER DEFIBRILLATOR N/A 10/10/2014   Procedure: BI-VENTRICULAR IMPLANTABLE CARDIOVERTER DEFIBRILLATOR  (CRT-D);  Surgeon: [name], MD;  Location: [facility] CATH LAB;  Service: Cardiovascular;  Laterality: N/A;  . CARDIAC CATHETERIZATION  2003  . Cataract      recent cataract surgery 6'14  . COLONOSCOPY N/A 01/25/2013   Procedure: COLONOSCOPY;  Surgeon: Irene Shipper, MD;  Location: [facility];  Service: Endoscopy;  Laterality: N/A;  . CORONARY ARTERY BYPASS GRAFT  1985   x 5 vessels  . LUMBAR DISC SURGERY    . PR POLYSOM 6/>YRS SLEEP  4/> ADDL PARAM ATTND  12/07/2015  . PROSTATE SURGERY    . TOTAL KNEE ARTHROPLASTY  08/01/2011   Procedure: TOTAL KNEE ARTHROPLASTY;  Surgeon: Johnn Hai;  Location: [facility];  Service: Orthopedics;  Laterality: Right;     Current Outpatient Prescriptions  Medication Sig Dispense Refill  . acetaminophen (TYLENOL) 500 MG tablet Take 1,000 mg by mouth as needed for pain.     Marland Kitchen amLODipine (NORVASC) 10 MG tablet Take 10 mg by mouth daily.    Marland Kitchen aspirin EC 81 MG tablet Take 81 mg by mouth every morning.    Marland Kitchen atorvastatin (LIPITOR) 10 MG tablet Take 10 mg by mouth daily.    Marland Kitchen b complex vitamins tablet Take 1 tablet by mouth daily.    . carvedilol (COREG) 25 MG tablet Take 1 tablet (25 mg total) by mouth 2 (two) times daily. 60 tablet 11  . Cholecalciferol (VITAMIN D-3) 1000 UNITS CAPS Take 2,000 Units by mouth daily.    Marland Kitchen CIALIS 20 MG tablet Take  20 mg by mouth daily as needed for erectile dysfunction.     . diphenhydrAMINE (BENADRYL) 25 mg capsule Take 25 mg by mouth every 6 (six) hours as needed for allergies.     . fluocinonide cream (LIDEX) 6.33 % Apply 1 application topically 2 (two) times daily.    . furosemide (LASIX) 80 MG tablet Take 80 mg by mouth daily.    . metFORMIN (GLUCOPHAGE) 500 MG tablet Take 500 mg by mouth daily with breakfast.    . Multiple Vitamin (MULTIVITAMIN) tablet Take 1 tablet by mouth daily.    Marland Kitchen omalizumab (XOLAIR) 150 MG injection Inject 150 mg into the skin every 14 (fourteen) days.    Marland Kitchen testosterone cypionate (DEPOTESTOTERONE CYPIONATE) 100 MG/ML injection Inject 400 mg into the muscle every 21 ( twenty-one) days. For IM use only    . triamcinolone (NASACORT AQ) 55 MCG/ACT AERO nasal inhaler Place 2 sprays into the nose daily.    Marland Kitchen zolpidem (AMBIEN) 10 MG tablet Take 1/2 to 1 tablet at bedtime as needed for sleep. 30 tablet 2   Current Facility-Administered Medications  Medication Dose Route Frequency Provider Last Rate Last Dose  . omalizumab Arvid Right) injection 300 mg  300 mg Subcutaneous Q14 Days Javier Glazier, MD   300 mg at 03/25/16 1244  . omalizumab Arvid Right) injection 300 mg  300 mg Subcutaneous Q14 Days Javier Glazier, MD      . omalizumab Arvid Right) injection 300 mg  300 mg Subcutaneous Q14 Days Javier Glazier, MD   300 mg at 04/09/16 1215  . omalizumab Arvid Right) injection 300 mg  300 mg Subcutaneous Q14 Days Javier Glazier, MD   300 mg at 04/24/16 1158    Allergies:   Codeine; Hydrocodone; and Azithromycin    Social History:  The patient  reports that he quit smoking about 57 years ago. His smoking use included Cigarettes, Pipe, and Cigars. He has a 5.00 pack-year smoking history. He has never used smokeless tobacco. He reports that he does not drink alcohol or use drugs.   Family History:  The patient's family history includes Bone cancer in his brother; Colon polyps in his father;  Diabetes in his sister; Heart attack in his father; Heart disease in his brother and father; Lung cancer in his mother.    ROS:  Please see the history of present illness.   PHYSICAL EXAM: VS:  BP (!) 142/76   Pulse 80   Wt 222  lb 8 oz (100.9 kg)   SpO2 97%   BMI 31.47 kg/m  , BMI Body mass index is 31.47 kg/m.  GEN: Elderly obese male, in no acute distress  HEENT: normal  Neck: JVP 7-9  Possible soft right carotid bruit, or masses Cardiac: RRR; no murmurs, rubs, or gallops, 1+ edema  Respiratory:  clear to auscultation bilaterally, normal work of breathing GI: soft, nontender, nondistended, + BS MS: no deformity or atrophy  Skin: warm and dry, no rash Neuro:  Strength and sensation are intact Psych: normal   Recent Labs: 09/18/2015: ALT 15 12/07/2015: Hemoglobin 14.0; Platelets 118.0 03/07/2016: B Natriuretic Peptide 127.6 03/26/2016: BUN 35; Creat 2.28; Potassium 4.2; Sodium 140    Lipid Panel    Component Value Date/Time   CHOL 104 (L) 09/18/2015 0955   TRIG 97 09/18/2015 0955   HDL 25 (L) 09/18/2015 0955   CHOLHDL 4.2 09/18/2015 0955   VLDL 19 09/18/2015 0955   LDLCALC 60 09/18/2015 0955      Wt Readings from Last 3 Encounters:  04/24/16 222 lb 8 oz (100.9 kg)  03/07/16 215 lb (97.5 kg)  01/22/16 225 lb (102.1 kg)     ICD interrogated personally: No VT/AF. Corevue - volume up but improving.   ASSESSMENT AND PLAN:  1. Chronic systolic congestive heart failure-due to ischemic cardiomyopathy. EF 40-45% echo 4/17. S/p ICD --Chronic NYHA II-III -- Volume status mildly up today. Will continue lasix 80 daily. Add back Entrest 24/26. Watch renal function very closely.  --Continue metolazone for weight at home greater than 214 --May benefit from hydralazine in future --Continue b-blocker --BMET today 2. Coronary artery disease - history of remote coronary artery bypass grafting 1985   --last cath 2003 LM and RCA occluded. LIMA to LAD ok. RIMA to OM ok  SVG to PDA  occluded. EF 45% 3. CKD 4, --followed by Dr. Hollie Salk in Nephrology 4. Hyperlipidemia-  5. Diabetes mellitus  6. Mild carotid artery disease.  - He has a very soft bruit. He had a duplex scan in 2007 which revealed mild carotid disease. He has a repeat pending 7. HTN --BP elevated ->  restart Jeanie Sewer, MD

## 2016-04-30 ENCOUNTER — Other Ambulatory Visit (HOSPITAL_COMMUNITY): Payer: Self-pay | Admitting: *Deleted

## 2016-04-30 MED ORDER — ATORVASTATIN CALCIUM 10 MG PO TABS: 10.0000 mg | ORAL_TABLET | Freq: Every day | ORAL | 3 refills | Status: DC

## 2016-05-01 ENCOUNTER — Other Ambulatory Visit: Payer: Medicare Other | Admitting: *Deleted

## 2016-05-01 DIAGNOSIS — I5022 Chronic systolic (congestive) heart failure: Secondary | ICD-10-CM | POA: Diagnosis not present

## 2016-05-01 DIAGNOSIS — G4733 Obstructive sleep apnea (adult) (pediatric): Secondary | ICD-10-CM

## 2016-05-01 LAB — CUP PACEART REMOTE DEVICE CHECK
Battery Remaining Longevity: 85 mo
Battery Voltage: 2.98 V
Brady Statistic AP VP Percent: 1.96 %
Brady Statistic RV Percent Paced: 95.78 %
HighPow Impedance: 53 Ohm
Implantable Lead Implant Date: 20160308
Implantable Lead Implant Date: 20160308
Implantable Lead Location: 753858
Implantable Lead Location: 753860
Implantable Lead Model: 4598
Implantable Lead Model: 6935
Lead Channel Impedance Value: 285 Ohm
Lead Channel Impedance Value: 380 Ohm
Lead Channel Impedance Value: 380 Ohm
Lead Channel Impedance Value: 437 Ohm
Lead Channel Impedance Value: 456 Ohm
Lead Channel Impedance Value: 456 Ohm
Lead Channel Impedance Value: 456 Ohm
Lead Channel Impedance Value: 570 Ohm
Lead Channel Pacing Threshold Amplitude: 2.375 V
Lead Channel Pacing Threshold Pulse Width: 0.4 ms
Lead Channel Sensing Intrinsic Amplitude: 13.25 mV
Lead Channel Sensing Intrinsic Amplitude: 2.75 mV
Lead Channel Sensing Intrinsic Amplitude: 2.75 mV
Lead Channel Setting Pacing Pulse Width: 0.8 ms
MDC IDC LEAD IMPLANT DT: 20160308
MDC IDC LEAD LOCATION: 753859
MDC IDC MSMT LEADCHNL LV IMPEDANCE VALUE: 570 Ohm
MDC IDC MSMT LEADCHNL LV IMPEDANCE VALUE: 703 Ohm
MDC IDC MSMT LEADCHNL LV IMPEDANCE VALUE: 760 Ohm
MDC IDC MSMT LEADCHNL LV IMPEDANCE VALUE: 760 Ohm
MDC IDC MSMT LEADCHNL LV PACING THRESHOLD AMPLITUDE: 1 V
MDC IDC MSMT LEADCHNL LV PACING THRESHOLD PULSEWIDTH: 0.6 ms
MDC IDC MSMT LEADCHNL RA PACING THRESHOLD AMPLITUDE: 0.875 V
MDC IDC MSMT LEADCHNL RA PACING THRESHOLD PULSEWIDTH: 0.4 ms
MDC IDC MSMT LEADCHNL RV IMPEDANCE VALUE: 342 Ohm
MDC IDC MSMT LEADCHNL RV SENSING INTR AMPL: 13.25 mV
MDC IDC SESS DTM: 20170914073524
MDC IDC SET LEADCHNL LV PACING AMPLITUDE: 2 V
MDC IDC SET LEADCHNL LV PACING PULSEWIDTH: 0.6 ms
MDC IDC SET LEADCHNL RA PACING AMPLITUDE: 2 V
MDC IDC SET LEADCHNL RV PACING AMPLITUDE: 2.5 V
MDC IDC SET LEADCHNL RV SENSING SENSITIVITY: 0.3 mV
MDC IDC STAT BRADY AP VS PERCENT: 0.01 %
MDC IDC STAT BRADY AS VP PERCENT: 97.68 %
MDC IDC STAT BRADY AS VS PERCENT: 0.35 %
MDC IDC STAT BRADY RA PERCENT PACED: 1.97 %

## 2016-05-01 LAB — BASIC METABOLIC PANEL
BUN: 48 mg/dL — AB (ref 7–25)
CALCIUM: 9.1 mg/dL (ref 8.6–10.3)
CO2: 25 mmol/L (ref 20–31)
Chloride: 99 mmol/L (ref 98–110)
Creat: 2.64 mg/dL — ABNORMAL HIGH (ref 0.70–1.11)
GLUCOSE: 160 mg/dL — AB (ref 65–99)
Potassium: 3.8 mmol/L (ref 3.5–5.3)
Sodium: 137 mmol/L (ref 135–146)

## 2016-05-01 NOTE — Addendum Note (Signed)
Addended by: Eulis Foster on: 05/01/2016 11:21 AM   Modules accepted: Orders

## 2016-05-02 ENCOUNTER — Other Ambulatory Visit (HOSPITAL_COMMUNITY): Payer: Self-pay | Admitting: *Deleted

## 2016-05-02 MED ORDER — ATORVASTATIN CALCIUM 10 MG PO TABS: 10.0000 mg | ORAL_TABLET | Freq: Every day | ORAL | 3 refills | Status: DC

## 2016-05-03 DIAGNOSIS — D225 Melanocytic nevi of trunk: Secondary | ICD-10-CM | POA: Diagnosis not present

## 2016-05-03 DIAGNOSIS — L570 Actinic keratosis: Secondary | ICD-10-CM | POA: Diagnosis not present

## 2016-05-03 DIAGNOSIS — Z1283 Encounter for screening for malignant neoplasm of skin: Secondary | ICD-10-CM | POA: Diagnosis not present

## 2016-05-03 DIAGNOSIS — X32XXXD Exposure to sunlight, subsequent encounter: Secondary | ICD-10-CM | POA: Diagnosis not present

## 2016-05-03 DIAGNOSIS — C44319 Basal cell carcinoma of skin of other parts of face: Secondary | ICD-10-CM | POA: Diagnosis not present

## 2016-05-03 DIAGNOSIS — L820 Inflamed seborrheic keratosis: Secondary | ICD-10-CM | POA: Diagnosis not present

## 2016-05-07 ENCOUNTER — Encounter: Payer: Self-pay | Admitting: Internal Medicine

## 2016-05-13 ENCOUNTER — Ambulatory Visit (INDEPENDENT_AMBULATORY_CARE_PROVIDER_SITE_OTHER): Payer: Medicare Other

## 2016-05-13 DIAGNOSIS — J4540 Moderate persistent asthma, uncomplicated: Secondary | ICD-10-CM | POA: Diagnosis not present

## 2016-05-14 DIAGNOSIS — Z96651 Presence of right artificial knee joint: Secondary | ICD-10-CM | POA: Diagnosis not present

## 2016-05-14 DIAGNOSIS — M1712 Unilateral primary osteoarthritis, left knee: Secondary | ICD-10-CM | POA: Diagnosis not present

## 2016-05-14 MED ORDER — OMALIZUMAB 150 MG ~~LOC~~ SOLR
300.0000 mg | Freq: Once | SUBCUTANEOUS | Status: AC
  Administered 2016-05-13: 300 mg via SUBCUTANEOUS

## 2016-05-16 DIAGNOSIS — E291 Testicular hypofunction: Secondary | ICD-10-CM | POA: Diagnosis not present

## 2016-05-23 ENCOUNTER — Telehealth: Payer: Self-pay | Admitting: Pulmonary Disease

## 2016-05-23 NOTE — Telephone Encounter (Signed)
#   vials:4 Ordered date:05/23/16 Shipping Date:05/24/16

## 2016-05-24 NOTE — Telephone Encounter (Signed)
#   Vials:4 Arrival Date:05/24/16 Lot #:[id number] Exp Date:4/21

## 2016-05-27 ENCOUNTER — Ambulatory Visit (INDEPENDENT_AMBULATORY_CARE_PROVIDER_SITE_OTHER): Payer: Medicare Other

## 2016-05-27 ENCOUNTER — Telehealth (HOSPITAL_COMMUNITY): Payer: Self-pay

## 2016-05-27 DIAGNOSIS — J4520 Mild intermittent asthma, uncomplicated: Secondary | ICD-10-CM

## 2016-05-27 MED ORDER — METOLAZONE 5 MG PO TABS
5.0000 mg | ORAL_TABLET | Freq: Every day | ORAL | 3 refills | Status: DC | PRN
Start: 2016-05-27 — End: 2016-12-03

## 2016-05-27 NOTE — Telephone Encounter (Signed)
Patient left VM on CHF triage line with name and DOB but nothing specifying his needs/questions.concerns. Left return VM to call back to clarify.  Renee Pain, RN

## 2016-05-27 NOTE — Telephone Encounter (Signed)
Patient returned call to CHF clinic to request PRN metolazone 5mg  tablet be sent to preferred pharmacy electronically. Request completed.  Renee Pain, RN

## 2016-05-28 ENCOUNTER — Ambulatory Visit (INDEPENDENT_AMBULATORY_CARE_PROVIDER_SITE_OTHER): Payer: Medicare Other

## 2016-05-28 DIAGNOSIS — J4520 Mild intermittent asthma, uncomplicated: Secondary | ICD-10-CM | POA: Diagnosis not present

## 2016-05-28 DIAGNOSIS — I5022 Chronic systolic (congestive) heart failure: Secondary | ICD-10-CM

## 2016-05-28 DIAGNOSIS — Z95810 Presence of automatic (implantable) cardiac defibrillator: Secondary | ICD-10-CM | POA: Diagnosis not present

## 2016-05-28 MED ORDER — OMALIZUMAB 150 MG ~~LOC~~ SOLR
300.0000 mg | SUBCUTANEOUS | Status: DC
  Administered 2016-05-28: 300 mg via SUBCUTANEOUS

## 2016-05-30 NOTE — Progress Notes (Signed)
EPIC Encounter for ICM Monitoring  Patient Name: Shane [name] is a 82 y.o. male Date: 05/30/2016 Primary Care Physican: [name], MD Primary Cardiologist:Nahser/Bensimhon Electrophysiologist: Caryl Comes Nephrologist: Hollie Salk at [facility] Weight: 215 lbs Bi-V Pacing:  96%       Heart Failure questions reviewed, pt symptomatic with 3 lb weight gain and took Metolazone 2.5 mg today.   Thoracic impedance abnormal suggesting fluid accumulation.  Recommendations:  Patient took Metolazone 2.5 mg today for weight > 214 lbs and he had reached 218 lbs.  No further recommendations today other than to limit salt intake to 2000 mg daily.    Follow-up plan: ICM clinic phone appointment on 07/01/2016 to recheck fluid levels.   Office check with Dr Haroldine Laws on 06/07/2016 to recheck fluid levels and follow up.  Appointment with Dr Acie Fredrickson 07/01/2016.  Copy of ICM check sent to primary cardiologist and device physician.   ICM trend: 05/28/2016       Rosalene Billings, RN 05/30/2016 12:40 PM

## 2016-06-01 DIAGNOSIS — Z23 Encounter for immunization: Secondary | ICD-10-CM | POA: Diagnosis not present

## 2016-06-05 ENCOUNTER — Encounter (HOSPITAL_COMMUNITY): Payer: Medicare Other | Admitting: Internal Medicine

## 2016-06-05 DIAGNOSIS — E291 Testicular hypofunction: Secondary | ICD-10-CM | POA: Diagnosis not present

## 2016-06-07 ENCOUNTER — Ambulatory Visit (HOSPITAL_COMMUNITY)
Admission: RE | Admit: 2016-06-07 | Discharge: 2016-06-07 | Disposition: A | Discharge status: Home / Self Care | Payer: Medicare Other | Source: Ambulatory Visit | Attending: Internal Medicine | Admitting: Internal Medicine

## 2016-06-07 ENCOUNTER — Encounter (HOSPITAL_COMMUNITY): Payer: Self-pay | Admitting: Internal Medicine

## 2016-06-07 VITALS — BP 116/60 | HR 65 | Wt 217.2 lb

## 2016-06-07 DIAGNOSIS — Z951 Presence of aortocoronary bypass graft: Secondary | ICD-10-CM | POA: Diagnosis not present

## 2016-06-07 DIAGNOSIS — F17210 Nicotine dependence, cigarettes, uncomplicated: Secondary | ICD-10-CM | POA: Diagnosis not present

## 2016-06-07 DIAGNOSIS — Z95810 Presence of automatic (implantable) cardiac defibrillator: Secondary | ICD-10-CM | POA: Insufficient documentation

## 2016-06-07 DIAGNOSIS — Z8371 Family history of colonic polyps: Secondary | ICD-10-CM | POA: Insufficient documentation

## 2016-06-07 DIAGNOSIS — I219 Acute myocardial infarction, unspecified: Secondary | ICD-10-CM | POA: Diagnosis not present

## 2016-06-07 DIAGNOSIS — E7800 Pure hypercholesterolemia, unspecified: Secondary | ICD-10-CM | POA: Diagnosis not present

## 2016-06-07 DIAGNOSIS — Z86010 Personal history of colonic polyps: Secondary | ICD-10-CM | POA: Diagnosis not present

## 2016-06-07 DIAGNOSIS — I5022 Chronic systolic (congestive) heart failure: Secondary | ICD-10-CM | POA: Diagnosis not present

## 2016-06-07 DIAGNOSIS — N184 Chronic kidney disease, stage 4 (severe): Secondary | ICD-10-CM | POA: Insufficient documentation

## 2016-06-07 DIAGNOSIS — I255 Ischemic cardiomyopathy: Secondary | ICD-10-CM | POA: Diagnosis not present

## 2016-06-07 DIAGNOSIS — H409 Unspecified glaucoma: Secondary | ICD-10-CM | POA: Diagnosis not present

## 2016-06-07 DIAGNOSIS — E1122 Type 2 diabetes mellitus with diabetic chronic kidney disease: Secondary | ICD-10-CM | POA: Diagnosis not present

## 2016-06-07 DIAGNOSIS — I129 Hypertensive chronic kidney disease with stage 1 through stage 4 chronic kidney disease, or unspecified chronic kidney disease: Secondary | ICD-10-CM | POA: Insufficient documentation

## 2016-06-07 DIAGNOSIS — N289 Disorder of kidney and ureter, unspecified: Secondary | ICD-10-CM | POA: Diagnosis not present

## 2016-06-07 DIAGNOSIS — Z9889 Other specified postprocedural states: Secondary | ICD-10-CM | POA: Diagnosis not present

## 2016-06-07 DIAGNOSIS — Z7984 Long term (current) use of oral hypoglycemic drugs: Secondary | ICD-10-CM | POA: Insufficient documentation

## 2016-06-07 DIAGNOSIS — Z8249 Family history of ischemic heart disease and other diseases of the circulatory system: Secondary | ICD-10-CM | POA: Insufficient documentation

## 2016-06-07 DIAGNOSIS — N400 Enlarged prostate without lower urinary tract symptoms: Secondary | ICD-10-CM | POA: Diagnosis not present

## 2016-06-07 DIAGNOSIS — Z8701 Personal history of pneumonia (recurrent): Secondary | ICD-10-CM | POA: Insufficient documentation

## 2016-06-07 DIAGNOSIS — Z96651 Presence of right artificial knee joint: Secondary | ICD-10-CM | POA: Diagnosis not present

## 2016-06-07 DIAGNOSIS — M1990 Unspecified osteoarthritis, unspecified site: Secondary | ICD-10-CM | POA: Insufficient documentation

## 2016-06-07 DIAGNOSIS — I2510 Atherosclerotic heart disease of native coronary artery without angina pectoris: Secondary | ICD-10-CM | POA: Diagnosis not present

## 2016-06-07 DIAGNOSIS — Z833 Family history of diabetes mellitus: Secondary | ICD-10-CM | POA: Insufficient documentation

## 2016-06-07 DIAGNOSIS — Z801 Family history of malignant neoplasm of trachea, bronchus and lung: Secondary | ICD-10-CM | POA: Insufficient documentation

## 2016-06-07 DIAGNOSIS — Z7982 Long term (current) use of aspirin: Secondary | ICD-10-CM | POA: Insufficient documentation

## 2016-06-07 DIAGNOSIS — Z881 Allergy status to other antibiotic agents status: Secondary | ICD-10-CM | POA: Insufficient documentation

## 2016-06-07 DIAGNOSIS — I25810 Atherosclerosis of coronary artery bypass graft(s) without angina pectoris: Secondary | ICD-10-CM | POA: Insufficient documentation

## 2016-06-07 DIAGNOSIS — Z885 Allergy status to narcotic agent status: Secondary | ICD-10-CM | POA: Insufficient documentation

## 2016-06-07 DIAGNOSIS — Z809 Family history of malignant neoplasm, unspecified: Secondary | ICD-10-CM | POA: Insufficient documentation

## 2016-06-07 LAB — BASIC METABOLIC PANEL
Anion gap: 10 (ref 5–15)
BUN: 65 mg/dL — AB (ref 6–20)
CALCIUM: 8.8 mg/dL — AB (ref 8.9–10.3)
CO2: 26 mmol/L (ref 22–32)
Chloride: 103 mmol/L (ref 101–111)
Creatinine, Ser: 2.59 mg/dL — ABNORMAL HIGH (ref 0.61–1.24)
GFR calc Af Amer: 25 mL/min — ABNORMAL LOW (ref >60)
GFR, EST NON AFRICAN AMERICAN: 22 mL/min — AB (ref >60)
GLUCOSE: 184 mg/dL — AB (ref 65–99)
Potassium: 3.6 mmol/L (ref 3.5–5.1)
Sodium: 139 mmol/L (ref 135–145)

## 2016-06-07 NOTE — Addendum Note (Signed)
Encounter addended by: Effie Berkshire, RN on: 06/07/2016 12:18 PM<BR>    Actions taken: Order Entry activity accessed, Diagnosis association updated, Sign clinical note

## 2016-06-07 NOTE — Patient Instructions (Signed)
Routine lab work today. Will notify you of abnormal results, otherwise no news is good news!  Follow up 3 months with Dr. Haroldine Laws.  Do the following things EVERYDAY: 1) Weigh yourself in the morning before breakfast. Write it down and keep it in a log. 2) Take your medicines as prescribed 3) Eat low salt foods-Limit salt (sodium) to 2000 mg per day.  4) Stay as active as you can everyday 5) Limit all fluids for the day to less than 2 liters

## 2016-06-07 NOTE — Progress Notes (Signed)
Patient ID: [name] Shane, male   DOB: January 24, 1934, 82 y.o.   MRN: [id number]   ADVANCED HF CLINIC NOTE   Date:  06/07/2016   ID:  Shane [name], DOB 1933/10/31, MRN [id number]  PCP:  Shane Rail, MD  Cardiologist:   Nahser  Nephrologist: Hollie Salk  History of Present Illness: Shane [name] is a 82 y.o. male with CAD s/p CABG, systolic HF EF 35-57% and CKD 4 referred to the HF Clinic by Dr. Acie Fredrickson.   He is retired from [employer]. Says he was doing fine until March or April when he started having more DOE. Felt he couldn't breath as deeply. MDT ICD was optimized. Took him off Toprol and switched to carvedilol. Also started Entresto. Began to gain fluid. Then lasix was started. (this was new). Entresto stopped. Creatine went from 1.9 to 3.3 then back to to 2.7  Here for f/u. At last visit restarted Entresto 24/26. Tolerating well. No dizziness. Occasionally measures BP at home 130-135/60-70. Weight down to 210 at home. Which is the lowest it has been in a while (usually 214-217). No edema, orthopnea or PND. No CP. Stays active around the house. Takes metolazone as needed.    Riding exercise bike 45 min/day 3-4 days week.     Past Medical History:  Diagnosis Date  . Abnormal nuclear cardiac imaging test Nov 2010   moderate area of infarct in the inferior wall with only minimal reversibility and EF of 28%  . Allergic rhinitis 01-08-13   Uses nebulizer for chronic sinus issues and Mucinex.  . Arthritis    osteoarthritis   . Blood transfusion    ? at time of bypass surgery   . BPH (benign prostatic hyperplasia)   . Cellulitis of arm, left    MSSA  . CKD (chronic kidney disease), stage III    "was told due to meds he takes"-no Renal workup done  . Colon polyps    adenomatous  . Coronary artery disease    remote CABG in 1985, cath in 2003 by Dr. Lia Foyer with no PCI, last nuclear in 2010 showing scar and EF of 28%.   . Diabetes mellitus   . Dyslipidemia   . Glaucoma  01-08-13   tx. eye drops  . HTN (hypertension)   . Hypercholesterolemia   . Left ventricular dysfunction    28% per nuclear in 2010 and 35 to 40% per echo in 2010  . Myocardial infarction    1985  . Neuromuscular disorder ([place])    legs mild paralysis-able to walk"nerve damage"-legs- left leg brace   . Pneumonia    hx of several times years ago   . Spinal stenosis     Past Surgical History:  Procedure Laterality Date  . BACK SURGERY     hx of back surgery x 4   . BI-VENTRICULAR IMPLANTABLE CARDIOVERTER DEFIBRILLATOR N/A 10/10/2014   Procedure: BI-VENTRICULAR IMPLANTABLE CARDIOVERTER DEFIBRILLATOR  (CRT-D);  Surgeon: [name], MD;  Location: [facility] CATH LAB;  Service: Cardiovascular;  Laterality: N/A;  . CARDIAC CATHETERIZATION  2003  . Cataract      recent cataract surgery 6'14  . COLONOSCOPY N/A 01/25/2013   Procedure: COLONOSCOPY;  Surgeon: Irene Shipper, MD;  Location: [facility] ENDOS[facility];  Service: Endoscopy;  Laterality: N/A;  . CORONARY ARTERY BYPASS GRAFT  1985   x 5 vessels  . LUMBAR DISC SURGERY    . PR POLYSOM 6/>YRS SLEEP 4/> ADDL PARAM ATTND  12/07/2015  . PROSTATE  SURGERY    . TOTAL KNEE ARTHROPLASTY  08/01/2011   Procedure: TOTAL KNEE ARTHROPLASTY;  Surgeon: Johnn Hai;  Location: [facility] ORS;  Service: Orthopedics;  Laterality: Right;     Current Outpatient Prescriptions  Medication Sig Dispense Refill  . acetaminophen (TYLENOL) 500 MG tablet Take 1,000 mg by mouth as needed for pain.     Marland Kitchen amLODipine (NORVASC) 10 MG tablet Take 10 mg by mouth daily.    Marland Kitchen aspirin EC 81 MG tablet Take 81 mg by mouth every morning.    Marland Kitchen atorvastatin (LIPITOR) 10 MG tablet Take 1 tablet (10 mg total) by mouth daily. 90 tablet 3  . b complex vitamins tablet Take 1 tablet by mouth daily.    . carvedilol (COREG) 25 MG tablet Take 1 tablet (25 mg total) by mouth 2 (two) times daily. 60 tablet 11  . Cholecalciferol (VITAMIN D-3) 1000 UNITS CAPS Take 2,000 Units by mouth daily.    Marland Kitchen CIALIS 20 MG  tablet Take 20 mg by mouth daily as needed for erectile dysfunction.     . fluocinonide cream (LIDEX) 1.02 % Apply 1 application topically 2 (two) times daily.    . furosemide (LASIX) 80 MG tablet Take 80 mg by mouth daily.    . metFORMIN (GLUCOPHAGE) 500 MG tablet Take 500 mg by mouth daily with breakfast.    . Multiple Vitamin (MULTIVITAMIN) tablet Take 1 tablet by mouth daily.    . sacubitril-valsartan (ENTRESTO) 24-26 MG Take 1 tablet by mouth 2 (two) times daily. 60 tablet 3  . testosterone cypionate (DEPOTESTOTERONE CYPIONATE) 100 MG/ML injection Inject 400 mg into the muscle every 21 ( twenty-one) days. For IM use only    . triamcinolone (NASACORT AQ) 55 MCG/ACT AERO nasal inhaler Place 2 sprays into the nose daily.    Marland Kitchen zolpidem (AMBIEN) 10 MG tablet Take 1/2 to 1 tablet at bedtime as needed for sleep. 30 tablet 2  . diphenhydrAMINE (BENADRYL) 25 mg capsule Take 25 mg by mouth every 6 (six) hours as needed for allergies.     . metolazone (ZAROXOLYN) 5 MG tablet Take 1 tablet (5 mg total) by mouth daily as needed. For weight greater than 214 lbs. (Patient not taking: Reported on 06/07/2016) 90 tablet 3   Current Facility-Administered Medications  Medication Dose Route Frequency Provider Last Rate Last Dose  . omalizumab Arvid Right) injection 300 mg  300 mg Subcutaneous Q14 Days Javier Glazier, MD   300 mg at 05/28/16 1203    Allergies:   Codeine; Hydrocodone; and Azithromycin    Social History:  The patient  reports that he quit smoking about 57 years ago. His smoking use included Cigarettes, Pipe, and Cigars. He has a 5.00 pack-year smoking history. He has never used smokeless tobacco. He reports that he does not drink alcohol or use drugs.   Family History:  The patient's family history includes Bone cancer in his brother; Colon polyps in his father; Diabetes in his sister; Heart attack in his father; Heart disease in his brother and father; Lung cancer in his mother.    ROS:  Please  see the history of present illness.   PHYSICAL EXAM: VS:  BP 116/60   Pulse 65   Wt 217 lb 4 oz (98.5 kg)   SpO2 98%   BMI 30.73 kg/m  , BMI Body mass index is 30.73 kg/m.  GEN: Elderly obese male, in no acute distress  HEENT: normal  Neck: JVP 5  Possible soft right  carotid bruit, or masses Cardiac: Mildyl Irregular ; no murmurs, rubs, or gallops, 1+ edema  Respiratory:  clear to auscultation bilaterally, normal work of breathing GI: soft, nontender, nondistended, + BS MS: no deformity or atrophy  Skin: warm and dry, no rash Neuro:  Strength and sensation are intact Psych: normal   Recent Labs: 09/18/2015: ALT 15 12/07/2015: Hemoglobin 14.0; Platelets 118.0 03/07/2016: B Natriuretic Peptide 127.6 05/01/2016: BUN 48; Creat 2.64; Potassium 3.8; Sodium 137    Lipid Panel    Component Value Date/Time   CHOL 104 (L) 09/18/2015 0955   TRIG 97 09/18/2015 0955   HDL 25 (L) 09/18/2015 0955   CHOLHDL 4.2 09/18/2015 0955   VLDL 19 09/18/2015 0955   LDLCALC 60 09/18/2015 0955      Wt Readings from Last 3 Encounters:  06/07/16 217 lb 4 oz (98.5 kg)  04/24/16 222 lb 8 oz (100.9 kg)  03/07/16 215 lb (97.5 kg)     ICD interrogated personally: No VT/AF. Corevue - volume up last week but now down to baseline activity 4hrs/day. 100% biv pacing  ASSESSMENT AND PLAN:  1. Chronic systolic congestive heart failure-due to ischemic cardiomyopathy. EF 40-45% echo 4/17. S/p ICD --Chronic NYHA II.  --Doing great today. Volume status looks good on exam and by Optivol (ICD interrogated personally) --Will continue lasix 80 daily.Continue Entresto 24/26 - would not push further given CKD at this point . Watch renal function very closely. Check BMET today --Continue metolazone for weight at home greater than 214 --Continue b-blocker 2. Coronary artery disease - history of remote coronary artery bypass grafting 1985   --last cath 2003 LM and RCA occluded. LIMA to LAD ok. RIMA to OM ok  SVG to PDA  occluded. EF 45% 3. CKD 4, --followed by Dr. Hollie Salk in Nephrology. Repeat BMET today 4. Hyperlipidemia-  5. Diabetes mellitus  6. Mild carotid artery disease.  - He has a very soft bruit. He had a duplex scan in 2007 which revealed mild carotid disease. He has a repeat pending 7. HTN --Much improved.   Glori Bickers, MD

## 2016-06-10 ENCOUNTER — Ambulatory Visit (INDEPENDENT_AMBULATORY_CARE_PROVIDER_SITE_OTHER): Payer: Medicare Other

## 2016-06-10 DIAGNOSIS — J4540 Moderate persistent asthma, uncomplicated: Secondary | ICD-10-CM | POA: Diagnosis not present

## 2016-06-11 ENCOUNTER — Ambulatory Visit (INDEPENDENT_AMBULATORY_CARE_PROVIDER_SITE_OTHER): Payer: Medicare Other | Admitting: Internal Medicine

## 2016-06-11 ENCOUNTER — Encounter: Payer: Self-pay | Admitting: Internal Medicine

## 2016-06-11 VITALS — BP 134/76 | HR 67 | Temp 97.8°F | Resp 16 | Ht 71.0 in | Wt 223.0 lb

## 2016-06-11 DIAGNOSIS — N183 Chronic kidney disease, stage 3 unspecified: Secondary | ICD-10-CM

## 2016-06-11 DIAGNOSIS — E7800 Pure hypercholesterolemia, unspecified: Secondary | ICD-10-CM | POA: Diagnosis not present

## 2016-06-11 DIAGNOSIS — I255 Ischemic cardiomyopathy: Secondary | ICD-10-CM

## 2016-06-11 DIAGNOSIS — E1122 Type 2 diabetes mellitus with diabetic chronic kidney disease: Secondary | ICD-10-CM

## 2016-06-11 DIAGNOSIS — I10 Essential (primary) hypertension: Secondary | ICD-10-CM

## 2016-06-11 MED ORDER — OMALIZUMAB 150 MG ~~LOC~~ SOLR
300.0000 mg | SUBCUTANEOUS | Status: DC
  Administered 2016-06-10: 300 mg via SUBCUTANEOUS

## 2016-06-11 NOTE — Progress Notes (Addendum)
Subjective:    Patient ID: Shane [name], male    DOB: 02/01/1934, 82 y.o.   MRN: [id number]  HPI The patient is here for follow up.  Hypertension: He is taking his medication daily. He is compliant with a low sodium diet.  He denies chest pain, palpitations, shortness of breath and regular headaches. He is exercising regularly.      CAD, ischemic CMP:  His medications were adjusted due to fluid retention.  He denies day to day SOB. His breathing is better with the adjustment of medication.   Diabetes: He was taken off his metformin by Dr Hollie Salk.  He is compliant with a diabetic diet. He is exercising regularly.  He checks his feet daily and denies foot lesions. He is up-to-date with an ophthalmology examination.   CKD: He has seen Dr Hollie Salk. His most recent kidney function was stable.  He denies changes in his urination.   Medications and allergies reviewed with patient and updated if appropriate.  Patient Active Problem List   Diagnosis Date Noted  . Restrictive lung disease 01/22/2016  . Severe obstructive sleep apnea 12/17/2015  . Essential hypertension 11/16/2015  . 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 LBBB (left bundle branch block) 10/10/2014  . Syncope 10/06/2014  . Pain in thumb joint with movement of right hand 01/04/2014  . Sinusitis, chronic 07/16/2013  . Asthma, moderate persistent 07/08/2013  . Chronic infection of sinus 07/31/2012  . Benign prostatic hypertrophy without urinary obstruction 12/18/2011  . CAD s/p coronary arthery bypass graft   . BPH (benign prostatic hyperplasia)   . Kidney disease, chronic, stage III (GFR 30-59 ml/min)   . H/O Spinal stenosis   . DM (diabetes mellitus), type 2 with renal complications ([place]) 27/10/5007  . Benign hypertensive heart disease without heart failure 11/07/2010  . Allergic rhinitis 11/07/2010  . Osteoarthritis 11/07/2010  . Ischemic cardiomyopathy   . Hypercholesterolemia     Current Outpatient Prescriptions on File Prior  to Visit  Medication Sig Dispense Refill  . acetaminophen (TYLENOL) 500 MG tablet Take 1,000 mg by mouth as needed for pain.     Marland Kitchen amLODipine (NORVASC) 10 MG tablet Take 10 mg by mouth daily.    Marland Kitchen aspirin EC 81 MG tablet Take 81 mg by mouth every morning.    Marland Kitchen atorvastatin (LIPITOR) 10 MG tablet Take 1 tablet (10 mg total) by mouth daily. 90 tablet 3  . b complex vitamins tablet Take 1 tablet by mouth daily.    . carvedilol (COREG) 25 MG tablet Take 1 tablet (25 mg total) by mouth 2 (two) times daily. 60 tablet 11  . Cholecalciferol (VITAMIN D-3) 1000 UNITS CAPS Take 2,000 Units by mouth daily.    Marland Kitchen CIALIS 20 MG tablet Take 20 mg by mouth daily as needed for erectile dysfunction.     . diphenhydrAMINE (BENADRYL) 25 mg capsule Take 25 mg by mouth every 6 (six) hours as needed for allergies.     . fluocinonide cream (LIDEX) 3.81 % Apply 1 application topically 2 (two) times daily.    . furosemide (LASIX) 80 MG tablet Take 80 mg by mouth daily.    . metolazone (ZAROXOLYN) 5 MG tablet Take 1 tablet (5 mg total) by mouth daily as needed. For weight greater than 214 lbs. 90 tablet 3  . Multiple Vitamin (MULTIVITAMIN) tablet Take 1 tablet by mouth daily.    . sacubitril-valsartan (ENTRESTO) 24-26 MG Take 1 tablet by mouth 2 (two) times daily. 60 tablet 3  .  testosterone cypionate (DEPOTESTOTERONE CYPIONATE) 100 MG/ML injection Inject 400 mg into the muscle every 21 ( twenty-one) days. For IM use only    . triamcinolone (NASACORT AQ) 55 MCG/ACT AERO nasal inhaler Place 2 sprays into the nose daily.    Marland Kitchen zolpidem (AMBIEN) 10 MG tablet Take 1/2 to 1 tablet at bedtime as needed for sleep. 30 tablet 2   Current Facility-Administered Medications on File Prior to Visit  Medication Dose Route Frequency Provider Last Rate Last Dose  . omalizumab Arvid Right) injection 300 mg  300 mg Subcutaneous Q14 Days Javier Glazier, MD   300 mg at 06/10/16 1157    Past Medical History:  Diagnosis Date  . Abnormal  nuclear cardiac imaging test Nov 2010   moderate area of infarct in the inferior wall with only minimal reversibility and EF of 28%  . Allergic rhinitis 01-08-13   Uses nebulizer for chronic sinus issues and Mucinex.  . Arthritis    osteoarthritis   . Blood transfusion    ? at time of bypass surgery   . BPH (benign prostatic hyperplasia)   . Cellulitis of arm, left    MSSA  . CKD (chronic kidney disease), stage III    "was told due to meds he takes"-no Renal workup done  . Colon polyps    adenomatous  . Coronary artery disease    remote CABG in 1985, cath in 2003 by Dr. Lia Foyer with no PCI, last nuclear in 2010 showing scar and EF of 28%.   . Diabetes mellitus   . Dyslipidemia   . Glaucoma 01-08-13   tx. eye drops  . HTN (hypertension)   . Hypercholesterolemia   . Left ventricular dysfunction    28% per nuclear in 2010 and 35 to 40% per echo in 2010  . Myocardial infarction    1985  . Neuromuscular disorder ([place])    legs mild paralysis-able to walk"nerve damage"-legs- left leg brace   . Pneumonia    hx of several times years ago   . Spinal stenosis     Past Surgical History:  Procedure Laterality Date  . BACK SURGERY     hx of back surgery x 4   . BI-VENTRICULAR IMPLANTABLE CARDIOVERTER DEFIBRILLATOR N/A 10/10/2014   Procedure: BI-VENTRICULAR IMPLANTABLE CARDIOVERTER DEFIBRILLATOR  (CRT-D);  Surgeon: [name], MD;  Location: [facility] CATH LAB;  Service: Cardiovascular;  Laterality: N/A;  . CARDIAC CATHETERIZATION  2003  . Cataract      recent cataract surgery 6'14  . COLONOSCOPY N/A 01/25/2013   Procedure: COLONOSCOPY;  Surgeon: Irene Shipper, MD;  Location: [facility];  Service: Endoscopy;  Laterality: N/A;  . CORONARY ARTERY BYPASS GRAFT  1985   x 5 vessels  . LUMBAR DISC SURGERY    . PR POLYSOM 6/>YRS SLEEP 4/> ADDL PARAM ATTND  12/07/2015  . PROSTATE SURGERY    . TOTAL KNEE ARTHROPLASTY  08/01/2011   Procedure: TOTAL KNEE ARTHROPLASTY;  Surgeon: Johnn Hai;   Location: [facility];  Service: Orthopedics;  Laterality: Right;    Social History   Social History  . Marital status: Married    Spouse name: N/A  . Number of children: 2  . Years of education: N/A   Occupational History  . pastor    Social History Main Topics  . Smoking status: Former Smoker    Packs/day: 1.00    Years: 5.00    Types: Cigarettes, Pipe, Cigars    Quit date: 08/05/1958  . Smokeless tobacco:  Never Used  . Alcohol use No  . Drug use: No  . Sexual activity: Not Currently   Other Topics Concern  . Not on file   Social History Narrative   Originally from [place]. He has always lived in [place]. Prior travel to [place], [place], [place], [place] ,[place], & [place]. 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 Has adopted children. Has a dog currently. No bird exposure. No mold exposure. Enjoys reading & traveling.    Family History  Problem Relation Age of Onset  . Heart attack Father   . Heart disease Father   . Colon polyps Father   . Diabetes Sister     x 2  . Heart disease Brother     x 2  . Lung cancer Mother   . Bone cancer Brother   . Lung disease Neg Hx     Review of Systems  Constitutional: Negative for chills and fever.  Respiratory: Negative for shortness of breath and wheezing.   Cardiovascular: Positive for leg swelling (improved). Negative for chest pain and palpitations.  Neurological: Positive for headaches (occ - sinus). Negative for dizziness and light-headedness.       Objective:   Vitals:   06/11/16 1335  BP: 134/76  Pulse: 67  Resp: 16  Temp: 97.8 F (36.6 C)   Filed Weights   06/11/16 1335  Weight: 223 lb (101.2 kg)   Body mass index is 31.1 kg/m.   Physical Exam    Constitutional: Appears well-developed and well-nourished. No distress.  HENT:  Head: Normocephalic and atraumatic.  Neck: Neck supple. No tracheal deviation present. No thyromegaly present.  No cervical lymphadenopathy Cardiovascular: Normal rate,  regular rhythm and normal heart sounds.   3/6 systolic murmur heard. No carotid bruit .  2+ pitting b/l LE edema to just above ankles Pulmonary/Chest: Effort normal and breath sounds normal. No respiratory distress. No has no wheezes. No rales.  Skin: Skin is warm and dry. Not diaphoretic.  Psychiatric: Normal mood and affect. Behavior is normal.      Assessment & Plan:    See Problem List for Assessment and Plan of chronic medical problems.   F/u in 6 months

## 2016-06-11 NOTE — Assessment & Plan Note (Signed)
BP well controlled Current regimen effective and well tolerated Continue current medications at current doses  

## 2016-06-11 NOTE — Progress Notes (Signed)
Pre visit review using our clinic review tool, if applicable. No additional management support is needed unless otherwise documented below in the visit note. 

## 2016-06-11 NOTE — Assessment & Plan Note (Signed)
Check lipid panel Continue lipitor 10 mg daily

## 2016-06-11 NOTE — Assessment & Plan Note (Signed)
Check a1c Will control with diet (metformin stopped which I agree with) Continue diabetic diet and regular exercise Work on weight loss

## 2016-06-11 NOTE — Assessment & Plan Note (Signed)
Has seen Dr Hollie Salk - CKD stable Has follow up with Dr Hollie Salk later this month cmp ordered for later this month

## 2016-06-11 NOTE — Patient Instructions (Signed)

## 2016-06-11 NOTE — Assessment & Plan Note (Signed)
Medication recently adjusted, on entresto Has cardiology follow up later this month

## 2016-06-13 ENCOUNTER — Telehealth: Payer: Self-pay | Admitting: Pulmonary Disease

## 2016-06-13 NOTE — Telephone Encounter (Signed)
#   vials:4 Ordered date:06/13/16 Shipping Date:06/14/16

## 2016-06-14 NOTE — Telephone Encounter (Signed)
#   Vials:4 Arrival Date:06/14/16 Lot #:[id number] Exp Date:4/21

## 2016-06-15 ENCOUNTER — Other Ambulatory Visit: Payer: Self-pay | Admitting: Internal Medicine

## 2016-06-15 DIAGNOSIS — G4700 Insomnia, unspecified: Secondary | ICD-10-CM

## 2016-06-17 NOTE — Telephone Encounter (Signed)
RX faxed to [facility] 

## 2016-06-19 ENCOUNTER — Encounter: Payer: Self-pay | Admitting: Internal Medicine

## 2016-06-24 ENCOUNTER — Ambulatory Visit (INDEPENDENT_AMBULATORY_CARE_PROVIDER_SITE_OTHER): Payer: Medicare Other

## 2016-06-24 DIAGNOSIS — J4540 Moderate persistent asthma, uncomplicated: Secondary | ICD-10-CM | POA: Diagnosis not present

## 2016-06-24 MED ORDER — OMALIZUMAB 150 MG ~~LOC~~ SOLR
300.0000 mg | SUBCUTANEOUS | Status: DC
  Administered 2016-06-24: 300 mg via SUBCUTANEOUS

## 2016-06-26 ENCOUNTER — Other Ambulatory Visit: Payer: Self-pay | Admitting: Internal Medicine

## 2016-07-01 ENCOUNTER — Encounter: Payer: Self-pay | Admitting: Cardiovascular Disease

## 2016-07-01 ENCOUNTER — Ambulatory Visit (INDEPENDENT_AMBULATORY_CARE_PROVIDER_SITE_OTHER): Payer: Medicare Other

## 2016-07-01 ENCOUNTER — Telehealth: Payer: Self-pay | Admitting: Pediatrics

## 2016-07-01 ENCOUNTER — Ambulatory Visit (INDEPENDENT_AMBULATORY_CARE_PROVIDER_SITE_OTHER): Payer: Medicare Other | Admitting: Cardiovascular Disease

## 2016-07-01 ENCOUNTER — Telehealth: Payer: Self-pay

## 2016-07-01 VITALS — BP 146/76 | HR 68 | Ht 71.0 in | Wt 219.8 lb

## 2016-07-01 DIAGNOSIS — E1122 Type 2 diabetes mellitus with diabetic chronic kidney disease: Secondary | ICD-10-CM | POA: Diagnosis not present

## 2016-07-01 DIAGNOSIS — I779 Disorder of arteries and arterioles, unspecified: Secondary | ICD-10-CM | POA: Diagnosis not present

## 2016-07-01 DIAGNOSIS — I5022 Chronic systolic (congestive) heart failure: Secondary | ICD-10-CM

## 2016-07-01 DIAGNOSIS — I2510 Atherosclerotic heart disease of native coronary artery without angina pectoris: Secondary | ICD-10-CM | POA: Diagnosis not present

## 2016-07-01 DIAGNOSIS — Z95810 Presence of automatic (implantable) cardiac defibrillator: Secondary | ICD-10-CM | POA: Diagnosis not present

## 2016-07-01 DIAGNOSIS — I255 Ischemic cardiomyopathy: Secondary | ICD-10-CM

## 2016-07-01 DIAGNOSIS — I5042 Chronic combined systolic (congestive) and diastolic (congestive) heart failure: Secondary | ICD-10-CM | POA: Insufficient documentation

## 2016-07-01 DIAGNOSIS — I739 Peripheral vascular disease, unspecified: Secondary | ICD-10-CM

## 2016-07-01 DIAGNOSIS — I10 Essential (primary) hypertension: Secondary | ICD-10-CM

## 2016-07-01 LAB — BASIC METABOLIC PANEL
BUN: 74 mg/dL — ABNORMAL HIGH (ref 7–25)
CALCIUM: 9 mg/dL (ref 8.6–10.3)
CO2: 30 mmol/L (ref 20–31)
CREATININE: 3.09 mg/dL — AB (ref 0.70–1.11)
Chloride: 96 mmol/L — ABNORMAL LOW (ref 98–110)
Glucose, Bld: 138 mg/dL — ABNORMAL HIGH (ref 65–99)
Potassium: 4.3 mmol/L (ref 3.5–5.3)
SODIUM: 136 mmol/L (ref 135–146)

## 2016-07-01 LAB — HEMOGLOBIN A1C
HEMOGLOBIN A1C: 6.5 % — AB (ref <5.7)
MEAN PLASMA GLUCOSE: 140 mg/dL

## 2016-07-01 NOTE — Telephone Encounter (Signed)
-----   Message from Thayer Headings, MD sent at 07/01/2016  4:21 PM EST ----- HbA1C looks great ( I have forwarded this lab to Dr. [name])   BMP shows increased creatinine.  I suspect that he needs to back off taking the metolazone slightly - he has been taking every week .  I would suggest taking every other week   Recheck BMP in 3 months

## 2016-07-01 NOTE — Progress Notes (Signed)
Cardiology Office Note   Date:  07/01/2016   ID:  DELMOS VELAQUEZ, DOB 30-Jun-1934, MRN [id number]  PCP:  [name], MD  Cardiologist:   Mertie Moores, MD   Chief Complaint  Patient presents with  . Follow-up    CAD, Chronic systolic CHF,    Problem list 1. Coronary artery disease - history of remote coronary artery bypass grafting 2. Chronic systolic congestive heart failure-due to ischemic cardiopathy 3. History of ICD placement - Klein  4. Hyperlipidemia 5. Diabetes mellitus  6. Mild carotid artery disease.    LORENZA WINKLEMAN is a 82 y.o. male who presents for follow up of his CAD and CHF. Was last seen by Dr. Mare Ferrari several weeks .   No CP , no dyspnea.  Rides an exercise bike.   Has a bad ankle. Has had right knee replacement  - still has trouble with the left knee  Is retired from [employer].   Sold Maalox  Is currently a pastor , reads quite a bit .  Nov. 27, 2017:  Syler is also seen in CHF clinic .  His chronic kidney disease. His last creatinine is 2.59 Takes Metalozone about once a week .  Able to get out and do his normal activities.   Gets fatigued easily   Past Medical History:  Diagnosis Date  . Abnormal nuclear cardiac imaging test Nov 2010   moderate area of infarct in the inferior wall with only minimal reversibility and EF of 28%  . Allergic rhinitis 01-08-13   Uses nebulizer for chronic sinus issues and Mucinex.  . Arthritis    osteoarthritis   . Blood transfusion    ? at time of bypass surgery   . BPH (benign prostatic hyperplasia)   . Cellulitis of arm, left    MSSA  . CKD (chronic kidney disease), stage III    "was told due to meds he takes"-no Renal workup done  . Colon polyps    adenomatous  . Coronary artery disease    remote CABG in 1985, cath in 2003 by Dr. Lia Foyer with no PCI, last nuclear in 2010 showing scar and EF of 28%.   . Diabetes mellitus   . Dyslipidemia   . Glaucoma 01-08-13   tx. eye drops  . HTN  (hypertension)   . Hypercholesterolemia   . Left ventricular dysfunction    28% per nuclear in 2010 and 35 to 40% per echo in 2010  . Myocardial infarction    1985  . Neuromuscular disorder ([place])    legs mild paralysis-able to walk"nerve damage"-legs- left leg brace   . Pneumonia    hx of several times years ago   . Spinal stenosis     Past Surgical History:  Procedure Laterality Date  . BACK SURGERY     hx of back surgery x 4   . BI-VENTRICULAR IMPLANTABLE CARDIOVERTER DEFIBRILLATOR N/A 10/10/2014   Procedure: BI-VENTRICULAR IMPLANTABLE CARDIOVERTER DEFIBRILLATOR  (CRT-D);  Surgeon: [name], MD;  Location: [facility] CATH LAB;  Service: Cardiovascular;  Laterality: N/A;  . CARDIAC CATHETERIZATION  2003  . Cataract      recent cataract surgery 6'14  . COLONOSCOPY N/A 01/25/2013   Procedure: COLONOSCOPY;  Surgeon: Irene Shipper, MD;  Location: [facility] ENDOSCOPY;  Service: Endoscopy;  Laterality: N/A;  . CORONARY ARTERY BYPASS GRAFT  1985   x 5 vessels  . LUMBAR DISC SURGERY    . PR POLYSOM 6/>YRS SLEEP 4/> ADDL PARAM ATTND  12/07/2015  . PROSTATE SURGERY    . TOTAL KNEE ARTHROPLASTY  08/01/2011   Procedure: TOTAL KNEE ARTHROPLASTY;  Surgeon: Johnn Hai;  Location: [facility] ORS;  Service: Orthopedics;  Laterality: Right;     Current Outpatient Prescriptions  Medication Sig Dispense Refill  . acetaminophen (TYLENOL) 500 MG tablet Take 1,000 mg by mouth as needed for pain.     Marland Kitchen amLODipine (NORVASC) 10 MG tablet Take 10 mg by mouth daily.    Marland Kitchen aspirin EC 81 MG tablet Take 81 mg by mouth every morning.    Marland Kitchen atorvastatin (LIPITOR) 10 MG tablet Take 1 tablet (10 mg total) by mouth daily. 90 tablet 3  . b complex vitamins tablet Take 1 tablet by mouth daily.    . carvedilol (COREG) 25 MG tablet Take 1 tablet (25 mg total) by mouth 2 (two) times daily. 60 tablet 11  . Cholecalciferol (VITAMIN D-3) 1000 UNITS CAPS Take 2,000 Units by mouth daily.    Marland Kitchen CIALIS 20 MG tablet Take 20 mg by mouth daily  as needed for erectile dysfunction.     . diphenhydrAMINE (BENADRYL) 25 mg capsule Take 25 mg by mouth every 6 (six) hours as needed for allergies.     . fluocinonide cream (LIDEX) 4.97 % Apply 1 application topically 2 (two) times daily.    . furosemide (LASIX) 80 MG tablet TAKE ONE TABLET BY MOUTH ONCE DAILY 30 tablet 6  . metolazone (ZAROXOLYN) 5 MG tablet Take 1 tablet (5 mg total) by mouth daily as needed. For weight greater than 214 lbs. 90 tablet 3  . Multiple Vitamin (MULTIVITAMIN) tablet Take 1 tablet by mouth daily.    . sacubitril-valsartan (ENTRESTO) 24-26 MG Take 1 tablet by mouth 2 (two) times daily. 60 tablet 3  . testosterone cypionate (DEPOTESTOTERONE CYPIONATE) 100 MG/ML injection Inject 400 mg into the muscle every 21 ( twenty-one) days. For IM use only    . triamcinolone (NASACORT AQ) 55 MCG/ACT AERO nasal inhaler Place 2 sprays into the nose daily.    Marland Kitchen zolpidem (AMBIEN) 10 MG tablet TAKE ONE-HALF TO ONE TABLET BY MOUTH AT BEDTIME AS NEEDED FOR SLEEP 30 tablet 2   Current Facility-Administered Medications  Medication Dose Route Frequency Provider Last Rate Last Dose  . omalizumab Arvid Right) injection 300 mg  300 mg Subcutaneous Q14 Days Javier Glazier, MD   300 mg at 06/10/16 1157  . omalizumab Arvid Right) injection 300 mg  300 mg Subcutaneous Q14 Days Javier Glazier, MD   300 mg at 06/24/16 1140    Allergies:   Codeine; Hydrocodone; and Azithromycin    Social History:  The patient  reports that he quit smoking about 57 years ago. His smoking use included Cigarettes, Pipe, and Cigars. He has a 5.00 pack-year smoking history. He has never used smokeless tobacco. He reports that he does not drink alcohol or use drugs.   Family History:  The patient's family history includes Bone cancer in his brother; Colon polyps in his father; Diabetes in his sister; Heart attack in his father; Heart disease in his brother and father; Lung cancer in his mother.    ROS:  Please see the  history of present illness.    Review of Systems: Constitutional:  denies fever, chills, diaphoresis, appetite change and fatigue.  HEENT: denies photophobia, eye pain, redness, hearing loss, ear pain, congestion, sore throat, rhinorrhea, sneezing, neck pain, neck stiffness and tinnitus.  Respiratory: denies SOB, DOE, cough, chest tightness, and wheezing.  Cardiovascular:  denies chest pain, palpitations and leg swelling.  Gastrointestinal: denies nausea, vomiting, abdominal pain, diarrhea, constipation, blood in stool.  Genitourinary: denies dysuria, urgency, frequency, hematuria, flank pain and difficulty urinating.  Musculoskeletal: denies  myalgias, back pain, joint swelling, arthralgias and gait problem.   Skin: denies pallor, rash and wound.  Neurological: denies dizziness, seizures, syncope, weakness, light-headedness, numbness and headaches.   Hematological: denies adenopathy, easy bruising, personal or family bleeding history.  Psychiatric/ Behavioral: denies suicidal ideation, mood changes, confusion, nervousness, sleep disturbance and agitation.       All other systems are reviewed and negative.    PHYSICAL EXAM: VS:  BP (!) 146/76 (BP Location: Left Arm, Patient Position: Sitting, Cuff Size: Normal)   Pulse 68   Ht 5\' 11"  (1.803 m)   Wt 219 lb 12.8 oz (99.7 kg)   BMI 30.66 kg/m  , BMI Body mass index is 30.66 kg/m. GEN: Well nourished, well developed, in no acute distress  HEENT: normal  Neck: no JVD,  Possible soft right carotid bruit, or masses Cardiac: RRR; no murmurs, rubs, or gallops,no edema  Respiratory:  clear to auscultation bilaterally, normal work of breathing GI: soft, nontender, nondistended, + BS MS: no deformity or atrophy  Skin: warm and dry, no rash Neuro:  Strength and sensation are intact Psych: normal   EKG:  EKG is not ordered today.    Recent Labs: 09/18/2015: ALT 15 12/07/2015: Hemoglobin 14.0; Platelets 118.0 03/07/2016: B Natriuretic  Peptide 127.6 06/07/2016: BUN 65; Creatinine, Ser 2.59; Potassium 3.6; Sodium 139    Lipid Panel    Component Value Date/Time   CHOL 104 (L) 09/18/2015 0955   TRIG 97 09/18/2015 0955   HDL 25 (L) 09/18/2015 0955   CHOLHDL 4.2 09/18/2015 0955   VLDL 19 09/18/2015 0955   LDLCALC 60 09/18/2015 0955      Wt Readings from Last 3 Encounters:  07/01/16 219 lb 12.8 oz (99.7 kg)  06/11/16 223 lb (101.2 kg)  06/07/16 217 lb 4 oz (98.5 kg)      Other studies Reviewed: Additional studies/ records that were reviewed today include: . Review of the above records demonstrates:    ASSESSMENT AND PLAN:  1. Coronary artery disease - history of remote coronary artery bypass grafting 2. Chronic systolic congestive heart failure-due to ischemic cardiopathy - continue current medications. His blood pressure is well-controlled. We drew labs today. His creatinine came back at 3.0. I suspect that he somewhat volume depleted. We will have him reduce his metolazone intake to 5 mg every other week instead of 5 mg each week.  His Optivol recording looked good today .   Hopefully , he will be able to maintain his optimal vlolume status on less metolazone  We will need to work very closely with the nephrologist and the heart failure team. Recheck labs in several months   3. History of ICD placement - Klein  4. Hyperlipidemia-   wil check lipids and CMET in 6 months at his next office visit   5. Diabetes mellitus - will check a HbA1C (Dr. [name] has requested)   6. Mild carotid artery disease.  - He has a very soft bruit heard at his last visit . He had a duplex scan in 2007 which revealed mild carotid disease. We'll recheck a duplex scan in 6 months    Current medicines are reviewed at length with the patient today.  The patient does not have concerns regarding medicines.   Disposition:   FU with  me in 6 months      Mertie Moores, MD  07/01/2016 8:47 AM    [address], [place],   [address] Phone: [phone]; Fax: [phone]

## 2016-07-01 NOTE — Progress Notes (Signed)
EPIC Encounter for ICM Monitoring  Patient Name: Shane [name] is a 82 y.o. male Date: 07/01/2016 Primary Care Physican: [name], MD Primary Cardiologist:Nahser/Bensimhon Electrophysiologist: Caryl Comes Nephrologist: Hollie Salk at [facility] Weight: unknown Bi-V Pacing:  92.8%       Attempted ICM call and unable to reach.  Left message to return call.  Transmission reviewed.   Patient has appointment office appointment with Dr Acie Fredrickson today.    Thoracic impedance normal   Recommendations: None.    Follow-up plan: ICM clinic phone appointment on 08/01/2016.  Copy of ICM check sent to primary cardiologist and device physician.   ICM trend: 07/01/2016       Rosalene Billings, RN 07/01/2016 7:42 AM

## 2016-07-01 NOTE — Progress Notes (Signed)
Spoke with patient.  He stated he is feeling good and denied any fluid symptoms.  Reviewed transmission.  Provided ICM direct number. No changes today.  Next ICM remote transmission for 08/01/2016

## 2016-07-01 NOTE — Telephone Encounter (Signed)
Remote ICM transmission received.  Attempted patient call and left message to return call.   

## 2016-07-01 NOTE — Patient Instructions (Addendum)
Medication Instructions:  Your physician recommends that you continue on your current medications as directed. Please refer to the Current Medication list given to you today.   Labwork: TODAY - H0Q, basic metabolic panel  Your physician recommends that you return for lab work in: 6 months on the day of or a few days before your office visit with Dr. Acie Fredrickson.  You will need to FAST for this appointment - nothing to eat or drink after midnight the night before except water.   Testing/Procedures: Your physician has requested that you have a carotid duplex in 6 months before your office visit with Dr. Acie Fredrickson. This test is an ultrasound of the carotid arteries in your neck. It looks at blood flow through these arteries that supply the brain with blood. Allow one hour for this exam. There are no restrictions or special instructions.   Follow-Up: Your physician wants you to follow-up in: 6 months with Dr. Acie Fredrickson.  You will receive a reminder letter in the mail two months in advance. If you don't receive a letter, please call our office to schedule the follow-up appointment.   If you need a refill on your cardiac medications before your next appointment, please call your pharmacy.   Thank you for choosing [facility]! Christen Bame, RN [phone]

## 2016-07-03 ENCOUNTER — Telehealth: Payer: Self-pay | Admitting: *Deleted

## 2016-07-03 DIAGNOSIS — E1129 Type 2 diabetes mellitus with other diabetic kidney complication: Secondary | ICD-10-CM | POA: Diagnosis not present

## 2016-07-03 DIAGNOSIS — I5022 Chronic systolic (congestive) heart failure: Secondary | ICD-10-CM | POA: Diagnosis not present

## 2016-07-03 DIAGNOSIS — E669 Obesity, unspecified: Secondary | ICD-10-CM | POA: Diagnosis not present

## 2016-07-03 DIAGNOSIS — I129 Hypertensive chronic kidney disease with stage 1 through stage 4 chronic kidney disease, or unspecified chronic kidney disease: Secondary | ICD-10-CM | POA: Diagnosis not present

## 2016-07-03 DIAGNOSIS — N184 Chronic kidney disease, stage 4 (severe): Secondary | ICD-10-CM | POA: Diagnosis not present

## 2016-07-03 DIAGNOSIS — I509 Heart failure, unspecified: Secondary | ICD-10-CM | POA: Diagnosis not present

## 2016-07-03 DIAGNOSIS — N2581 Secondary hyperparathyroidism of renal origin: Secondary | ICD-10-CM | POA: Diagnosis not present

## 2016-07-03 DIAGNOSIS — E1165 Type 2 diabetes mellitus with hyperglycemia: Secondary | ICD-10-CM | POA: Diagnosis not present

## 2016-07-03 NOTE — Telephone Encounter (Signed)
Spoke with patient to advise that per Dr. Caryl Comes, patient's MDT CRT-D needs reprogramming to encourage BiV pacing.  Short AV delays noted at higher sinus rates, per Dr. Caryl Comes adaptive CRT should be turned on.  Patient is agreeable to [facility] appointment on 07/08/16 at 3:00pm.  He is appreciative of call and denies additional questions or concerns at this time.

## 2016-07-04 DIAGNOSIS — E291 Testicular hypofunction: Secondary | ICD-10-CM | POA: Diagnosis not present

## 2016-07-08 ENCOUNTER — Ambulatory Visit (INDEPENDENT_AMBULATORY_CARE_PROVIDER_SITE_OTHER): Payer: Medicare Other

## 2016-07-08 DIAGNOSIS — J4520 Mild intermittent asthma, uncomplicated: Secondary | ICD-10-CM | POA: Diagnosis not present

## 2016-07-08 MED ORDER — OMALIZUMAB 150 MG ~~LOC~~ SOLR
300.0000 mg | SUBCUTANEOUS | Status: DC
  Administered 2016-07-08: 300 mg via SUBCUTANEOUS

## 2016-07-09 ENCOUNTER — Encounter: Payer: Self-pay | Admitting: Internal Medicine

## 2016-07-10 ENCOUNTER — Ambulatory Visit (INDEPENDENT_AMBULATORY_CARE_PROVIDER_SITE_OTHER): Payer: Medicare Other | Admitting: *Deleted

## 2016-07-10 DIAGNOSIS — Z95810 Presence of automatic (implantable) cardiac defibrillator: Secondary | ICD-10-CM

## 2016-07-10 NOTE — Progress Notes (Signed)
Patient presented to day for adaptive CRT turned on. Presenting rhythm at 65. Nonadaptive CRT changed to Adaptive Bi-V and LV with industry present. ROV with SK 10/2016

## 2016-07-16 ENCOUNTER — Other Ambulatory Visit: Payer: Self-pay | Admitting: *Deleted

## 2016-07-16 DIAGNOSIS — Z01810 Encounter for preprocedural cardiovascular examination: Secondary | ICD-10-CM

## 2016-07-16 DIAGNOSIS — N185 Chronic kidney disease, stage 5: Secondary | ICD-10-CM

## 2016-07-18 ENCOUNTER — Telehealth: Payer: Self-pay | Admitting: Pulmonary Disease

## 2016-07-18 NOTE — Telephone Encounter (Signed)
#   vials:4 Ordered date:07/18/16 Shipping Date:07/19/16

## 2016-07-19 NOTE — Telephone Encounter (Signed)
#   Vials:4 Arrival Date:07/19/16 Lot #:[id number] Exp Date:6/21

## 2016-07-22 ENCOUNTER — Ambulatory Visit (INDEPENDENT_AMBULATORY_CARE_PROVIDER_SITE_OTHER): Payer: Medicare Other

## 2016-07-22 DIAGNOSIS — J4540 Moderate persistent asthma, uncomplicated: Secondary | ICD-10-CM

## 2016-07-23 MED ORDER — OMALIZUMAB 150 MG ~~LOC~~ SOLR
300.0000 mg | SUBCUTANEOUS | Status: DC
  Administered 2016-07-22: 300 mg via SUBCUTANEOUS

## 2016-07-25 DIAGNOSIS — E291 Testicular hypofunction: Secondary | ICD-10-CM | POA: Diagnosis not present

## 2016-08-01 ENCOUNTER — Ambulatory Visit (INDEPENDENT_AMBULATORY_CARE_PROVIDER_SITE_OTHER): Payer: Medicare Other | Admitting: *Deleted

## 2016-08-01 DIAGNOSIS — Z95810 Presence of automatic (implantable) cardiac defibrillator: Secondary | ICD-10-CM

## 2016-08-01 DIAGNOSIS — I255 Ischemic cardiomyopathy: Secondary | ICD-10-CM | POA: Diagnosis not present

## 2016-08-01 DIAGNOSIS — I5022 Chronic systolic (congestive) heart failure: Secondary | ICD-10-CM

## 2016-08-01 NOTE — Progress Notes (Signed)
Remote ICD transmission.   

## 2016-08-01 NOTE — Progress Notes (Signed)
EPIC Encounter for ICM Monitoring  Patient Name: NYKOLAS BACALLAO is a 82 y.o. male Date: 08/01/2016 Primary Care Physican: [name], MD Primary Cardiologist:Nahser/Bensimhon Electrophysiologist: Caryl Comes Nephrologist: Hollie Salk at [facility] Weight: unknown Bi-V Pacing:  92.2%                                                        Heart Failure questions reviewed, pt asymptomatic   Thoracic impedance normal   Recommendations: No changes.  Reinforced to limit low salt food choices to 2000 mg day and limiting fluid intake to < 2 liters per day. Encouraged to call for fluid symptoms.    Follow-up plan: ICM clinic phone appointment on 09/03/2016.  Copy of ICM check sent to device physician.   3 month ICM trend : 08/01/2016   1 Year ICM trend:      Rosalene Billings, RN 08/01/2016 4:12 PM

## 2016-08-02 ENCOUNTER — Encounter: Payer: Self-pay | Admitting: Cardiology

## 2016-08-06 ENCOUNTER — Ambulatory Visit (INDEPENDENT_AMBULATORY_CARE_PROVIDER_SITE_OTHER): Payer: Medicare Other

## 2016-08-06 DIAGNOSIS — J4520 Mild intermittent asthma, uncomplicated: Secondary | ICD-10-CM

## 2016-08-08 ENCOUNTER — Telehealth: Payer: Self-pay | Admitting: Pulmonary Disease

## 2016-08-08 MED ORDER — OMALIZUMAB 150 MG ~~LOC~~ SOLR
300.0000 mg | SUBCUTANEOUS | Status: DC
  Administered 2016-08-06: 300 mg via SUBCUTANEOUS

## 2016-08-09 ENCOUNTER — Telehealth: Payer: Self-pay | Admitting: Pulmonary Disease

## 2016-08-09 LAB — CUP PACEART REMOTE DEVICE CHECK
Battery Remaining Longevity: 78 mo
Brady Statistic RA Percent Paced: 2.25 %
Date Time Interrogation Session: 20171228092405
HIGH POWER IMPEDANCE MEASURED VALUE: 59 Ohm
Implantable Lead Implant Date: 20160308
Implantable Lead Implant Date: 20160308
Implantable Lead Implant Date: 20160308
Implantable Lead Location: 753858
Implantable Lead Model: 4598
Implantable Lead Model: 6935
Implantable Pulse Generator Implant Date: 20160308
Lead Channel Impedance Value: 399 Ohm
Lead Channel Impedance Value: 456 Ohm
Lead Channel Impedance Value: 513 Ohm
Lead Channel Impedance Value: 513 Ohm
Lead Channel Impedance Value: 608 Ohm
Lead Channel Impedance Value: 760 Ohm
Lead Channel Pacing Threshold Pulse Width: 0.4 ms
Lead Channel Pacing Threshold Pulse Width: 0.4 ms
Lead Channel Sensing Intrinsic Amplitude: 14.375 mV
Lead Channel Sensing Intrinsic Amplitude: 14.375 mV
Lead Channel Setting Pacing Amplitude: 2 V
Lead Channel Setting Pacing Amplitude: 2 V
Lead Channel Setting Pacing Pulse Width: 0.6 ms
MDC IDC LEAD LOCATION: 753859
MDC IDC LEAD LOCATION: 753860
MDC IDC MSMT BATTERY VOLTAGE: 2.98 V
MDC IDC MSMT LEADCHNL LV IMPEDANCE VALUE: 342 Ohm
MDC IDC MSMT LEADCHNL LV IMPEDANCE VALUE: 399 Ohm
MDC IDC MSMT LEADCHNL LV IMPEDANCE VALUE: 399 Ohm
MDC IDC MSMT LEADCHNL LV IMPEDANCE VALUE: 608 Ohm
MDC IDC MSMT LEADCHNL LV IMPEDANCE VALUE: 836 Ohm
MDC IDC MSMT LEADCHNL LV IMPEDANCE VALUE: 836 Ohm
MDC IDC MSMT LEADCHNL LV PACING THRESHOLD AMPLITUDE: 1 V
MDC IDC MSMT LEADCHNL LV PACING THRESHOLD PULSEWIDTH: 0.6 ms
MDC IDC MSMT LEADCHNL RA PACING THRESHOLD AMPLITUDE: 0.625 V
MDC IDC MSMT LEADCHNL RA SENSING INTR AMPL: 3.75 mV
MDC IDC MSMT LEADCHNL RA SENSING INTR AMPL: 3.75 mV
MDC IDC MSMT LEADCHNL RV IMPEDANCE VALUE: 437 Ohm
MDC IDC MSMT LEADCHNL RV PACING THRESHOLD AMPLITUDE: 2.375 V
MDC IDC SET LEADCHNL RV PACING AMPLITUDE: 2.5 V
MDC IDC SET LEADCHNL RV PACING PULSEWIDTH: 0.8 ms
MDC IDC SET LEADCHNL RV SENSING SENSITIVITY: 0.3 mV
MDC IDC STAT BRADY AP VP PERCENT: 2.18 %
MDC IDC STAT BRADY AP VS PERCENT: 0.1 %
MDC IDC STAT BRADY AS VP PERCENT: 95.9 %
MDC IDC STAT BRADY AS VS PERCENT: 1.82 %
MDC IDC STAT BRADY RV PERCENT PACED: 0.15 %

## 2016-08-09 NOTE — Telephone Encounter (Signed)
#   Vials:4 Arrival Date:08/09/16 Lot #:[id number] Exp Date:8/21

## 2016-08-09 NOTE — Telephone Encounter (Signed)
Created in error

## 2016-08-09 NOTE — Telephone Encounter (Addendum)
#   vials:4 Ordered date:08/08/16 Shipping Date:08/09/16 Forgot to put it in.

## 2016-08-12 ENCOUNTER — Other Ambulatory Visit (HOSPITAL_COMMUNITY): Payer: Medicare Other

## 2016-08-12 ENCOUNTER — Ambulatory Visit: Payer: Medicare Other | Admitting: Vascular Surgery

## 2016-08-12 ENCOUNTER — Encounter (HOSPITAL_COMMUNITY): Payer: Medicare Other

## 2016-08-14 DIAGNOSIS — E291 Testicular hypofunction: Secondary | ICD-10-CM | POA: Diagnosis not present

## 2016-08-19 ENCOUNTER — Other Ambulatory Visit (HOSPITAL_COMMUNITY): Payer: Self-pay | Admitting: Internal Medicine

## 2016-08-20 ENCOUNTER — Ambulatory Visit (INDEPENDENT_AMBULATORY_CARE_PROVIDER_SITE_OTHER): Payer: Medicare Other

## 2016-08-20 DIAGNOSIS — J4540 Moderate persistent asthma, uncomplicated: Secondary | ICD-10-CM | POA: Diagnosis not present

## 2016-08-22 ENCOUNTER — Encounter: Payer: Self-pay | Admitting: Vascular Surgery

## 2016-08-23 MED ORDER — OMALIZUMAB 150 MG ~~LOC~~ SOLR
300.0000 mg | SUBCUTANEOUS | Status: DC
  Administered 2016-08-20: 300 mg via SUBCUTANEOUS

## 2016-08-27 ENCOUNTER — Ambulatory Visit (HOSPITAL_COMMUNITY)
Admission: RE | Admit: 2016-08-27 | Discharge: 2016-08-27 | Disposition: A | Discharge status: Home / Self Care | Payer: Medicare Other | Source: Ambulatory Visit | Attending: Vascular Surgery | Admitting: Vascular Surgery

## 2016-08-27 ENCOUNTER — Other Ambulatory Visit: Payer: Self-pay

## 2016-08-27 ENCOUNTER — Ambulatory Visit (INDEPENDENT_AMBULATORY_CARE_PROVIDER_SITE_OTHER): Payer: Medicare Other | Admitting: Vascular Surgery

## 2016-08-27 ENCOUNTER — Encounter: Payer: Self-pay | Admitting: Vascular Surgery

## 2016-08-27 ENCOUNTER — Ambulatory Visit (INDEPENDENT_AMBULATORY_CARE_PROVIDER_SITE_OTHER)
Admission: RE | Admit: 2016-08-27 | Discharge: 2016-08-27 | Disposition: A | Discharge status: Home / Self Care | Payer: Medicare Other | Source: Ambulatory Visit | Attending: Vascular Surgery | Admitting: Vascular Surgery

## 2016-08-27 VITALS — BP 95/61 | HR 64 | Temp 97.4°F | Resp 20 | Ht 71.0 in | Wt 220.2 lb

## 2016-08-27 DIAGNOSIS — N184 Chronic kidney disease, stage 4 (severe): Secondary | ICD-10-CM

## 2016-08-27 DIAGNOSIS — Z01810 Encounter for preprocedural cardiovascular examination: Secondary | ICD-10-CM | POA: Insufficient documentation

## 2016-08-27 DIAGNOSIS — N185 Chronic kidney disease, stage 5: Secondary | ICD-10-CM | POA: Insufficient documentation

## 2016-08-27 NOTE — Progress Notes (Signed)
Vascular and Vein Specialist of [facility]  Patient name: Shane [name] MRN: [id number] DOB: 1934/04/18 Sex: male  REASON FOR CONSULT: His Access for Hemodialysis  HPI: Shane [name] is a 83 y.o. male, who is here today for discussion of access for hemodialysis. He is not on dialysis currently has chronic renal insufficiency dating back at least until 2012. He has had some worsening. He is seen today for discussion of AV access. He is not currently on hemodialysis.  Past Medical History:  Diagnosis Date  . Abnormal nuclear cardiac imaging test Nov 2010   moderate area of infarct in the inferior wall with only minimal reversibility and EF of 28%  . Allergic rhinitis 01-08-13   Uses nebulizer for chronic sinus issues and Mucinex.  . Arthritis    osteoarthritis   . Blood transfusion    ? at time of bypass surgery   . BPH (benign prostatic hyperplasia)   . Cellulitis of arm, left    MSSA  . CHF (congestive heart failure) ([place])   . CKD (chronic kidney disease), stage III    "was told due to meds he takes"-no Renal workup done  . Colon polyps    adenomatous  . Coronary artery disease    remote CABG in 1985, cath in 2003 by Dr. Lia Foyer with no PCI, last nuclear in 2010 showing scar and EF of 28%.   . Diabetes mellitus   . Dyslipidemia   . Glaucoma 01-08-13   tx. eye drops  . HTN (hypertension)   . Hypercholesterolemia   . Left ventricular dysfunction    28% per nuclear in 2010 and 35 to 40% per echo in 2010  . Myocardial infarction    1985  . Neuromuscular disorder ([place])    legs mild paralysis-able to walk"nerve damage"-legs- left leg brace   . Pneumonia    hx of several times years ago   . Spinal stenosis     Family History  Problem Relation Age of Onset  . Heart attack Father   . Heart disease Father   . Colon polyps Father   . Lung cancer Mother   . Diabetes Sister     x 2  . Heart disease Brother     x 2  . Bone cancer Brother    . Lung disease Neg Hx     SOCIAL HISTORY: Social History   Social History  . Marital status: Married    Spouse name: N/A  . Number of children: 2  . Years of education: N/A   Occupational History  . pastor    Social History Main Topics  . Smoking status: Former Smoker    Packs/day: 1.00    Years: 5.00    Types: Cigarettes, Pipe, Cigars    Quit date: 08/05/1958  . Smokeless tobacco: Never Used  . Alcohol use No  . Drug use: No  . Sexual activity: Not Currently   Other Topics Concern  . Not on file   Social History Narrative   Originally from [place]. He has always lived in [place]. Prior travel to [place], [place], [place], [place] ,[place], & [place]. 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 Has adopted children. Has a dog currently. No bird exposure. No mold exposure. Enjoys reading & traveling.    Allergies  Allergen Reactions  . Codeine Other (See Comments)    anxiety  . Hydrocodone Other (See Comments)    anxiety  . Azithromycin Rash    Current  Outpatient Prescriptions  Medication Sig Dispense Refill  . acetaminophen (TYLENOL) 500 MG tablet Take 1,000 mg by mouth as needed for pain.     Marland Kitchen amLODipine (NORVASC) 10 MG tablet Take 10 mg by mouth daily.    Marland Kitchen aspirin EC 81 MG tablet Take 81 mg by mouth every morning.    Marland Kitchen atorvastatin (LIPITOR) 10 MG tablet Take 1 tablet (10 mg total) by mouth daily. 90 tablet 3  . b complex vitamins tablet Take 1 tablet by mouth daily.    . carvedilol (COREG) 25 MG tablet Take 1 tablet (25 mg total) by mouth 2 (two) times daily. 60 tablet 11  . Cholecalciferol (VITAMIN D-3) 1000 UNITS CAPS Take 2,000 Units by mouth daily.    Marland Kitchen CIALIS 20 MG tablet Take 20 mg by mouth daily as needed for erectile dysfunction.     . diphenhydrAMINE (BENADRYL) 25 mg capsule Take 25 mg by mouth every 6 (six) hours as needed for allergies.     Marland Kitchen ENTRESTO 24-26 MG TAKE ONE TABLET BY MOUTH TWICE DAILY 60 tablet 3  . fluocinonide cream (LIDEX) 7.06  % Apply 1 application topically 2 (two) times daily.    . furosemide (LASIX) 80 MG tablet TAKE ONE TABLET BY MOUTH ONCE DAILY 30 tablet 6  . Multiple Vitamin (MULTIVITAMIN) tablet Take 1 tablet by mouth daily.    Marland Kitchen testosterone cypionate (DEPOTESTOTERONE CYPIONATE) 100 MG/ML injection Inject 400 mg into the muscle every 21 ( twenty-one) days. For IM use only    . triamcinolone (NASACORT AQ) 55 MCG/ACT AERO nasal inhaler Place 2 sprays into the nose daily.    Marland Kitchen zolpidem (AMBIEN) 10 MG tablet TAKE ONE-HALF TO ONE TABLET BY MOUTH AT BEDTIME AS NEEDED FOR SLEEP 30 tablet 2  . metolazone (ZAROXOLYN) 5 MG tablet Take 1 tablet (5 mg total) by mouth daily as needed. For weight greater than 214 lbs. 90 tablet 3   Current Facility-Administered Medications  Medication Dose Route Frequency Provider Last Rate Last Dose  . omalizumab Arvid Right) injection 300 mg  300 mg Subcutaneous Q14 Days Javier Glazier, MD   300 mg at 06/10/16 1157  . omalizumab Arvid Right) injection 300 mg  300 mg Subcutaneous Q14 Days Javier Glazier, MD   300 mg at 06/24/16 1140  . omalizumab Arvid Right) injection 300 mg  300 mg Subcutaneous Q14 Days Javier Glazier, MD   300 mg at 07/08/16 1411  . omalizumab Arvid Right) injection 300 mg  300 mg Subcutaneous Q14 Days Javier Glazier, MD   300 mg at 07/22/16 1159  . omalizumab Arvid Right) injection 300 mg  300 mg Subcutaneous Q14 Days Deneise Lever, MD   300 mg at 08/06/16 1622  . omalizumab Arvid Right) injection 300 mg  300 mg Subcutaneous Q14 Days Javier Glazier, MD   300 mg at 08/20/16 1215    REVIEW OF SYSTEMS:  [X]  denotes positive finding, [ ]  denotes negative finding Cardiac  Comments:  Chest pain or chest pressure:    Shortness of breath upon exertion:    Short of breath when lying flat:    Irregular heart rhythm:        Vascular    Pain in calf, thigh, or hip brought on by ambulation:    Pain in feet at night that wakes you up from your sleep:     Blood clot in your veins:     Leg swelling:         Pulmonary  Oxygen at home:    Productive cough:     Wheezing:         Neurologic    Sudden weakness in arms or legs:     Sudden numbness in arms or legs:     Sudden onset of difficulty speaking or slurred speech:    Temporary loss of vision in one eye:     Problems with dizziness:         Gastrointestinal    Blood in stool:     Vomited blood:         Genitourinary    Burning when urinating:     Blood in urine:        Psychiatric    Major depression:         Hematologic    Bleeding problems:    Problems with blood clotting too easily:        Skin    Rashes or ulcers:        Constitutional    Fever or chills:      PHYSICAL EXAM: Vitals:   08/27/16 0852  BP: 95/61  Pulse: 64  Resp: 20  Temp: 97.4 F (36.3 C)  TempSrc: Oral  SpO2: 93%  Weight: 220 lb 3.2 oz (99.9 kg)  Height: 5\' 11"  (1.803 m)    GENERAL: The patient is a well-nourished male, in no acute distress. The vital signs are documented above. CARDIOVASCULAR: 2+ radial pulses bilaterally. Moderate-sized cephalic vein at the wrist bilaterally PULMONARY: There is good air exchange  ABDOMEN: Soft and non-tender  MUSCULOSKELETAL: There are no major deformities or cyanosis. NEUROLOGIC: No focal weakness or paresthesias are detected. SKIN: There are no ulcers or rashes noted. PSYCHIATRIC: The patient has a normal affect.  DATA:  Patient had the opportunity vein mapping on the right. Also arterial studies. Vein map shows his cephalic vein is approximately 3 mm throughout its course with a large basilic vein at the 6-7 mm from the antecubital space and proximally. He has normal triphasic waveforms on the right.  MEDICAL ISSUES: Chronic renal insufficiency signed today for discussion of AV access. Had a long discussion with the patient and his wife present regarding options for hemodialysis access. I discussed tunneled catheter, AV fistula and AV grafts and the limitations of each of  these. He currently is not on dialysis. He does have a left-sided pacemaker and so would avoid access in his left arm. He does appear to have cephalic vein acceptable for attempted fistula. I imaged this myself. He has Thana Ramp branching at the distal forearm with a more dominant on the more lateral aspect does connect to his antecubital vein in his upper extremity cephalic vein. I have recommended right arm cephalic vein fistula. Also discussed that he has a very large basilic vein that this was required in the future should provide him an excellent opportunity as well. I discussed limitations of non-maturation and failure. He understands and wished to proceed. We will schedule this at his earliest convenience for outpatient surgery   Rosetta Posner, MD [facility] of [facility] Tel [phone] Pager [phone]

## 2016-08-28 DIAGNOSIS — E291 Testicular hypofunction: Secondary | ICD-10-CM | POA: Diagnosis not present

## 2016-08-28 DIAGNOSIS — R351 Nocturia: Secondary | ICD-10-CM | POA: Diagnosis not present

## 2016-08-28 DIAGNOSIS — Z87438 Personal history of other diseases of male genital organs: Secondary | ICD-10-CM | POA: Diagnosis not present

## 2016-09-03 ENCOUNTER — Ambulatory Visit (INDEPENDENT_AMBULATORY_CARE_PROVIDER_SITE_OTHER): Payer: Medicare Other

## 2016-09-03 ENCOUNTER — Telehealth: Payer: Self-pay

## 2016-09-03 DIAGNOSIS — Z95810 Presence of automatic (implantable) cardiac defibrillator: Secondary | ICD-10-CM

## 2016-09-03 DIAGNOSIS — I5022 Chronic systolic (congestive) heart failure: Secondary | ICD-10-CM | POA: Diagnosis not present

## 2016-09-03 DIAGNOSIS — J4520 Mild intermittent asthma, uncomplicated: Secondary | ICD-10-CM

## 2016-09-03 NOTE — Telephone Encounter (Signed)
Remote ICM transmission received.  Attempted patient call and left detailed message regarding transmission and next ICM scheduled for 10/04/2016.  Advised to return call for any fluid symptoms or questions.

## 2016-09-03 NOTE — Progress Notes (Signed)
EPIC Encounter for ICM Monitoring  Patient Name: Shane [name] is a 83 y.o. male Date: 09/03/2016 Primary Care Physican: [name], MD Primary Cardiologist:Nahser/Bensimhon Electrophysiologist: Caryl Comes Nephrologist: Hollie Salk at [facility] Weight: unknown Bi-V Pacing: 93.3%      Attempted call to patient and unable to reach.  Left detailed message regarding transmission.  Transmission reviewed.   Thoracic impedance normal   Labs: 07/01/2016 Creatinine 3.09, BUN 74, Potassium 4.3, Sodium 136 06/07/2016 Creatinine 2.59, BUN 65, Potassium 3.6, Sodium 139, EGFR 22-25  05/01/2016 Creatinine 2.64, BUN 48, Potassium 3.8, Sodium 137  04/24/2016 Creatinine 2.46, BUN 38, Potassium 3.9, Sodium 138, EGFR 23-27  03/26/2016 Creatinine 2.28, BUN 35, Potassium 4.2, Sodium 140  03/07/2016 Creatinine 2.47, BUN 45, Potassium 4.1, Sodium 137 02/15/2016 Creatinine 2.72, BUN 55, Potassium 3.9, Sodium 135  02/05/2016 Creatinine 2.43, BUN 56, Potassium 4.5, Sodium 138  01/31/2016 Creatinine 3.36, BUN 77, Potassium 4.5, Sodium 135  11/16/2015 Creatinine 1.93, BUN 37, Potassium 4.3, Sodium 137  11/05/2016 Creatinine 2.21, BUN 53, Potassium 5.0, Sodium 138  Recommendations: Provided ICM number and encouraged to call for fluid symptoms.  Follow-up plan: ICM clinic phone appointment on 10/04/2016.  Copy of ICM check sent to device physician.   3 month ICM trend: 09/03/2016   1 Year ICM trend:      Rosalene Billings, RN 09/03/2016 3:15 PM

## 2016-09-04 DIAGNOSIS — E291 Testicular hypofunction: Secondary | ICD-10-CM | POA: Diagnosis not present

## 2016-09-04 MED ORDER — OMALIZUMAB 150 MG ~~LOC~~ SOLR
300.0000 mg | SUBCUTANEOUS | Status: DC
  Administered 2016-09-03: 300 mg via SUBCUTANEOUS

## 2016-09-04 NOTE — Progress Notes (Signed)
Documentation and charges have been completed by Desmond Dike, CMA based on the [place] documentation sheet completed by [facility].

## 2016-09-06 ENCOUNTER — Encounter (HOSPITAL_COMMUNITY): Payer: Self-pay | Admitting: *Deleted

## 2016-09-09 ENCOUNTER — Other Ambulatory Visit: Payer: Self-pay | Admitting: *Deleted

## 2016-09-09 ENCOUNTER — Encounter (HOSPITAL_COMMUNITY): Payer: Self-pay

## 2016-09-09 ENCOUNTER — Ambulatory Visit (HOSPITAL_COMMUNITY)
Admission: RE | Admit: 2016-09-09 | Discharge: 2016-09-09 | Disposition: A | Discharge status: Home / Self Care | Payer: Medicare Other | Source: Ambulatory Visit | Attending: Vascular Surgery | Admitting: Vascular Surgery

## 2016-09-09 ENCOUNTER — Encounter (HOSPITAL_COMMUNITY)
Admission: RE | Disposition: A | Discharge status: Home / Self Care | Payer: Self-pay | Source: Ambulatory Visit | Attending: Vascular Surgery

## 2016-09-09 ENCOUNTER — Ambulatory Visit (HOSPITAL_COMMUNITY): Payer: Medicare Other | Admitting: Certified Registered Nurse Anesthetist

## 2016-09-09 DIAGNOSIS — Z950 Presence of cardiac pacemaker: Secondary | ICD-10-CM | POA: Diagnosis not present

## 2016-09-09 DIAGNOSIS — Z4931 Encounter for adequacy testing for hemodialysis: Secondary | ICD-10-CM

## 2016-09-09 DIAGNOSIS — N184 Chronic kidney disease, stage 4 (severe): Secondary | ICD-10-CM | POA: Insufficient documentation

## 2016-09-09 DIAGNOSIS — E1122 Type 2 diabetes mellitus with diabetic chronic kidney disease: Secondary | ICD-10-CM | POA: Diagnosis not present

## 2016-09-09 DIAGNOSIS — I509 Heart failure, unspecified: Secondary | ICD-10-CM | POA: Insufficient documentation

## 2016-09-09 DIAGNOSIS — I129 Hypertensive chronic kidney disease with stage 1 through stage 4 chronic kidney disease, or unspecified chronic kidney disease: Secondary | ICD-10-CM | POA: Diagnosis not present

## 2016-09-09 DIAGNOSIS — I130 Hypertensive heart and chronic kidney disease with heart failure and stage 1 through stage 4 chronic kidney disease, or unspecified chronic kidney disease: Secondary | ICD-10-CM | POA: Insufficient documentation

## 2016-09-09 DIAGNOSIS — Z7982 Long term (current) use of aspirin: Secondary | ICD-10-CM | POA: Diagnosis not present

## 2016-09-09 DIAGNOSIS — I252 Old myocardial infarction: Secondary | ICD-10-CM | POA: Diagnosis not present

## 2016-09-09 DIAGNOSIS — Z87891 Personal history of nicotine dependence: Secondary | ICD-10-CM | POA: Diagnosis not present

## 2016-09-09 DIAGNOSIS — I2510 Atherosclerotic heart disease of native coronary artery without angina pectoris: Secondary | ICD-10-CM | POA: Diagnosis not present

## 2016-09-09 DIAGNOSIS — N185 Chronic kidney disease, stage 5: Secondary | ICD-10-CM | POA: Diagnosis not present

## 2016-09-09 DIAGNOSIS — E7800 Pure hypercholesterolemia, unspecified: Secondary | ICD-10-CM | POA: Insufficient documentation

## 2016-09-09 DIAGNOSIS — G4730 Sleep apnea, unspecified: Secondary | ICD-10-CM | POA: Diagnosis not present

## 2016-09-09 DIAGNOSIS — I5032 Chronic diastolic (congestive) heart failure: Secondary | ICD-10-CM | POA: Diagnosis not present

## 2016-09-09 DIAGNOSIS — Z951 Presence of aortocoronary bypass graft: Secondary | ICD-10-CM | POA: Diagnosis not present

## 2016-09-09 DIAGNOSIS — Z79899 Other long term (current) drug therapy: Secondary | ICD-10-CM | POA: Insufficient documentation

## 2016-09-09 DIAGNOSIS — N186 End stage renal disease: Secondary | ICD-10-CM

## 2016-09-09 HISTORY — DX: Unspecified hearing loss, unspecified ear: H91.90

## 2016-09-09 HISTORY — DX: Polyneuropathy, unspecified: G62.9

## 2016-09-09 HISTORY — DX: Presence of automatic (implantable) cardiac defibrillator: Z95.810

## 2016-09-09 HISTORY — PX: BASCILIC VEIN TRANSPOSITION: SHX5742

## 2016-09-09 HISTORY — DX: Unspecified asthma, uncomplicated: J45.909

## 2016-09-09 LAB — POCT I-STAT 4, (NA,K, GLUC, HGB,HCT)
GLUCOSE: 133 mg/dL — AB (ref 65–99)
HCT: 44 % (ref 39.0–52.0)
Hemoglobin: 15 g/dL (ref 13.0–17.0)
Potassium: 3.5 mmol/L (ref 3.5–5.1)
SODIUM: 138 mmol/L (ref 135–145)

## 2016-09-09 SURGERY — TRANSPOSITION, VEIN, BASILIC
Anesthesia: Monitor Anesthesia Care | Site: Arm Upper | Laterality: Right

## 2016-09-09 MED ORDER — FENTANYL CITRATE (PF) 100 MCG/2ML IJ SOLN
INTRAMUSCULAR | Status: AC
  Filled 2016-09-09: qty 2

## 2016-09-09 MED ORDER — TRAMADOL HCL 50 MG PO TABS: 50.0000 mg | ORAL_TABLET | Freq: Three times a day (TID) | ORAL | 0 refills | Status: DC | PRN

## 2016-09-09 MED ORDER — EPHEDRINE SULFATE 50 MG/ML IJ SOLN
INTRAMUSCULAR | Status: DC | PRN
  Administered 2016-09-09 (×2): 5 mg via INTRAVENOUS

## 2016-09-09 MED ORDER — DEXTROSE 5 % IV SOLN
1.5000 g | INTRAVENOUS | Status: AC
  Administered 2016-09-09: 1.5 g via INTRAVENOUS

## 2016-09-09 MED ORDER — 0.9 % SODIUM CHLORIDE (POUR BTL) OPTIME
TOPICAL | Status: DC | PRN
Start: 2016-09-09 — End: 2016-09-09
  Administered 2016-09-09: 1000 mL

## 2016-09-09 MED ORDER — PROPOFOL 500 MG/50ML IV EMUL
INTRAVENOUS | Status: DC | PRN
  Administered 2016-09-09: 50 ug/kg/min via INTRAVENOUS

## 2016-09-09 MED ORDER — LIDOCAINE-EPINEPHRINE (PF) 1 %-1:200000 IJ SOLN
INTRAMUSCULAR | Status: AC
  Filled 2016-09-09: qty 30

## 2016-09-09 MED ORDER — FENTANYL CITRATE (PF) 100 MCG/2ML IJ SOLN
INTRAMUSCULAR | Status: DC | PRN
  Administered 2016-09-09 (×2): 25 ug via INTRAVENOUS
  Administered 2016-09-09: 50 ug via INTRAVENOUS
  Administered 2016-09-09 (×4): 25 ug via INTRAVENOUS

## 2016-09-09 MED ORDER — PHENYLEPHRINE HCL 10 MG/ML IJ SOLN
INTRAMUSCULAR | Status: DC | PRN
Start: 2016-09-09 — End: 2016-09-09
  Administered 2016-09-09 (×3): 120 ug via INTRAVENOUS

## 2016-09-09 MED ORDER — PROPOFOL 500 MG/50ML IV EMUL
INTRAVENOUS | Status: AC
  Filled 2016-09-09: qty 50

## 2016-09-09 MED ORDER — ROCURONIUM BROMIDE 50 MG/5ML IV SOSY
PREFILLED_SYRINGE | INTRAVENOUS | Status: AC
  Filled 2016-09-09: qty 5

## 2016-09-09 MED ORDER — PROPOFOL 1000 MG/100ML IV EMUL
INTRAVENOUS | Status: AC
  Filled 2016-09-09: qty 100

## 2016-09-09 MED ORDER — LIDOCAINE-EPINEPHRINE (PF) 1 %-1:200000 IJ SOLN
INTRAMUSCULAR | Status: DC | PRN
  Administered 2016-09-09: 18 mL

## 2016-09-09 MED ORDER — FENTANYL CITRATE (PF) 100 MCG/2ML IJ SOLN: 25.0000 ug | INTRAMUSCULAR | Status: DC | PRN

## 2016-09-09 MED ORDER — LIDOCAINE HCL (PF) 1 % IJ SOLN
INTRAMUSCULAR | Status: AC
  Filled 2016-09-09: qty 30

## 2016-09-09 MED ORDER — NEOSTIGMINE METHYLSULFATE 5 MG/5ML IV SOSY
PREFILLED_SYRINGE | INTRAVENOUS | Status: AC
  Filled 2016-09-09: qty 5

## 2016-09-09 MED ORDER — EPINEPHRINE PF 1 MG/ML IJ SOLN
INTRAMUSCULAR | Status: AC
  Filled 2016-09-09: qty 1

## 2016-09-09 MED ORDER — DEXTROSE 5 % IV SOLN
INTRAVENOUS | Status: AC
  Filled 2016-09-09: qty 1.5

## 2016-09-09 MED ORDER — ONDANSETRON HCL 4 MG/2ML IJ SOLN
INTRAMUSCULAR | Status: AC
  Filled 2016-09-09: qty 2

## 2016-09-09 MED ORDER — SODIUM CHLORIDE 0.9 % IV SOLN
INTRAVENOUS | Status: DC | PRN
  Administered 2016-09-09: 09:00:00

## 2016-09-09 MED ORDER — PHENYLEPHRINE 40 MCG/ML (10ML) SYRINGE FOR IV PUSH (FOR BLOOD PRESSURE SUPPORT)
PREFILLED_SYRINGE | INTRAVENOUS | Status: AC
  Filled 2016-09-09: qty 30

## 2016-09-09 MED ORDER — LIDOCAINE 2% (20 MG/ML) 5 ML SYRINGE
INTRAMUSCULAR | Status: AC
  Filled 2016-09-09: qty 10

## 2016-09-09 MED ORDER — PHENYLEPHRINE HCL 10 MG/ML IJ SOLN
INTRAVENOUS | Status: DC | PRN
  Administered 2016-09-09: 50 ug/min via INTRAVENOUS

## 2016-09-09 MED ORDER — SUCCINYLCHOLINE CHLORIDE 200 MG/10ML IV SOSY
PREFILLED_SYRINGE | INTRAVENOUS | Status: AC
  Filled 2016-09-09: qty 10

## 2016-09-09 MED ORDER — MIDAZOLAM HCL 5 MG/5ML IJ SOLN
INTRAMUSCULAR | Status: DC | PRN
  Administered 2016-09-09: 1 mg via INTRAVENOUS
  Administered 2016-09-09 (×2): 0.5 mg via INTRAVENOUS

## 2016-09-09 MED ORDER — PROPOFOL 10 MG/ML IV BOLUS
INTRAVENOUS | Status: AC
  Filled 2016-09-09: qty 20

## 2016-09-09 MED ORDER — MIDAZOLAM HCL 2 MG/2ML IJ SOLN
INTRAMUSCULAR | Status: AC
  Filled 2016-09-09: qty 2

## 2016-09-09 MED ORDER — SODIUM CHLORIDE 0.9 % IV SOLN
INTRAVENOUS | Status: DC | PRN
  Administered 2016-09-09 (×2): via INTRAVENOUS

## 2016-09-09 MED ORDER — LIDOCAINE HCL (PF) 0.5 % IJ SOLN
INTRAMUSCULAR | Status: AC
  Filled 2016-09-09: qty 50

## 2016-09-09 MED ORDER — EPHEDRINE 5 MG/ML INJ
INTRAVENOUS | Status: AC
  Filled 2016-09-09: qty 20

## 2016-09-09 SURGICAL SUPPLY — 32 items
ADH SKN CLS APL DERMABOND .7 (GAUZE/BANDAGES/DRESSINGS) ×2
ARMBAND PINK RESTRICT EXTREMIT (MISCELLANEOUS) ×8 IMPLANT
CANISTER SUCTION 2500CC (MISCELLANEOUS) ×4 IMPLANT
CANNULA VESSEL 3MM 2 BLNT TIP (CANNULA) ×4 IMPLANT
CLIP LIGATING EXTRA MED SLVR (CLIP) ×4 IMPLANT
CLIP LIGATING EXTRA SM BLUE (MISCELLANEOUS) ×4 IMPLANT
COVER PROBE W GEL 5X96 (DRAPES) ×8 IMPLANT
DECANTER SPIKE VIAL GLASS SM (MISCELLANEOUS) ×4 IMPLANT
DERMABOND ADVANCED (GAUZE/BANDAGES/DRESSINGS) ×2
DERMABOND ADVANCED .7 DNX12 (GAUZE/BANDAGES/DRESSINGS) ×2 IMPLANT
ELECT REM PT RETURN 9FT ADLT (ELECTROSURGICAL) ×4
ELECTRODE REM PT RTRN 9FT ADLT (ELECTROSURGICAL) ×2 IMPLANT
GAUZE SPONGE 4X4 12PLY STRL (GAUZE/BANDAGES/DRESSINGS) ×4 IMPLANT
GEL ULTRASOUND 20GR AQUASONIC (MISCELLANEOUS) IMPLANT
GLOVE SS BIOGEL STRL SZ 7.5 (GLOVE) ×2 IMPLANT
GLOVE SUPERSENSE BIOGEL SZ 7.5 (GLOVE) ×2
GOWN STRL REUS W/ TWL LRG LVL3 (GOWN DISPOSABLE) ×6 IMPLANT
GOWN STRL REUS W/TWL LRG LVL3 (GOWN DISPOSABLE) ×12
KIT BASIN OR (CUSTOM PROCEDURE TRAY) ×4 IMPLANT
KIT ROOM TURNOVER OR (KITS) ×4 IMPLANT
NS IRRIG 1000ML POUR BTL (IV SOLUTION) ×4 IMPLANT
PACK CV ACCESS (CUSTOM PROCEDURE TRAY) ×4 IMPLANT
PAD ARMBOARD 7.5X6 YLW CONV (MISCELLANEOUS) ×8 IMPLANT
SUT PROLENE 6 0 CC (SUTURE) ×8 IMPLANT
SUT SILK 2 0 SH (SUTURE) ×4 IMPLANT
SUT SILK 3 0 (SUTURE) ×4
SUT SILK 3-0 18XBRD TIE 12 (SUTURE) ×2 IMPLANT
SUT VIC AB 3-0 SH 27 (SUTURE) ×12
SUT VIC AB 3-0 SH 27X BRD (SUTURE) ×6 IMPLANT
SUT VICRYL 4-0 PS2 18IN ABS (SUTURE) ×8 IMPLANT
UNDERPAD 30X30 (UNDERPADS AND DIAPERS) ×4 IMPLANT
WATER STERILE IRR 1000ML POUR (IV SOLUTION) ×4 IMPLANT

## 2016-09-09 NOTE — Op Note (Signed)
    OPERATIVE REPORT  DATE OF SURGERY: 09/09/2016  PATIENT: Shane [name], 83 y.o. male MRN: [id number]  DOB: April 15, 1934  PRE-OPERATIVE DIAGNOSIS: Chronic renal insufficiency  POST-OPERATIVE DIAGNOSIS:  Same  PROCEDURE: Right arm basilic vein transposition fistula  SURGEON:  Curt Jews, M.D.  PHYSICIAN ASSISTANT: Samantha Rhyne PA-C  ANESTHESIA:  Local with sedation  EBL: Minimal ml  Total I/O In: 600 [I.V.:600] Out: 25 [Blood:25]  BLOOD ADMINISTERED: None  DRAINS: None  SPECIMEN: None  COUNTS CORRECT:  YES  PLAN OF CARE: PACU   PATIENT DISPOSITION:  PACU - hemodynamically stable  PROCEDURE DETAILS: The patient was taken to the operative placed supine position where the area the right arm prepped in usual sterile fashion. SonoSite ultrasound was used to visualize the veins in the right arm. She did have a patent cephalic vein at the antecubital space that looked to be a small to moderate in size. Incision was made over the cephalic vein at the antecubital space small. Tributary branches were ligated and the vein was dilated ligated distally and divided. The vein was dilated with heparinized saline was felt to be inadequate for fistula creation. For this reason this vein was ligated. The patient had a very large basilic vein. Incision was made over the antecubital space ^ M isolate the basilic vein and the brachial artery which was also large. Separate incision was made in the mid upper arm and then near the axilla. The basilic vein branches were ligated with 301 4-0 silk ties and divided. The vein was mobilized further distally and was ligated and divided and the vein was brought out through the axillary incision. The vein was marked prior to removal from the tunnel to reduce risk for twisting. The vein was gently dilated was a very large diameter. A 6 mm Gore tunneler was used to create a subcutaneous tunnel from the level of the antecubital space to the axilla. The vein was  brought through the tunnel. The artery was occluded proximal and distally was opened with an 11 blade and seminal gently with Potts scissors. Small arteriotomy was created. The vein was spatulated and sewn end-to-side to the artery with a running 6-0 Prolene suture. Clamps removed and excellent thrill was noted. The patient did maintain a right radial pulse. Wounds irrigated with saline. Hemostasis tablet cautery. Wounds were closed with 3-0 Vicryl the subcutaneous and subcuticular tissue. Sterile dressing was applied the patient was transferred to the recovery room in stable condition   Rosetta Posner, M.D., [facility] 09/09/2016 11:18 AM

## 2016-09-09 NOTE — H&P (View-Only) (Signed)
Vascular and Vein Specialist of [facility]  Patient name: Shane [name] MRN: [id number] DOB: 1933/08/16 Sex: male  REASON FOR CONSULT: His Access for Hemodialysis  HPI: Shane [name] is a 83 y.o. male, who is here today for discussion of access for hemodialysis. He is not on dialysis currently has chronic renal insufficiency dating back at least until 2012. He has had some worsening. He is seen today for discussion of AV access. He is not currently on hemodialysis.  Past Medical History:  Diagnosis Date  . Abnormal nuclear cardiac imaging test Nov 2010   moderate area of infarct in the inferior wall with only minimal reversibility and EF of 28%  . Allergic rhinitis 01-08-13   Uses nebulizer for chronic sinus issues and Mucinex.  . Arthritis    osteoarthritis   . Blood transfusion    ? at time of bypass surgery   . BPH (benign prostatic hyperplasia)   . Cellulitis of arm, left    MSSA  . CHF (congestive heart failure) ([place])   . CKD (chronic kidney disease), stage III    "was told due to meds he takes"-no Renal workup done  . Colon polyps    adenomatous  . Coronary artery disease    remote CABG in 1985, cath in 2003 by Dr. Lia Foyer with no PCI, last nuclear in 2010 showing scar and EF of 28%.   . Diabetes mellitus   . Dyslipidemia   . Glaucoma 01-08-13   tx. eye drops  . HTN (hypertension)   . Hypercholesterolemia   . Left ventricular dysfunction    28% per nuclear in 2010 and 35 to 40% per echo in 2010  . Myocardial infarction    1985  . Neuromuscular disorder ([place])    legs mild paralysis-able to walk"nerve damage"-legs- left leg brace   . Pneumonia    hx of several times years ago   . Spinal stenosis     Family History  Problem Relation Age of Onset  . Heart attack Father   . Heart disease Father   . Colon polyps Father   . Lung cancer Mother   . Diabetes Sister     x 2  . Heart disease Brother     x 2  . Bone cancer Brother    . Lung disease Neg Hx     SOCIAL HISTORY: Social History   Social History  . Marital status: Married    Spouse name: N/A  . Number of children: 2  . Years of education: N/A   Occupational History  . pastor    Social History Main Topics  . Smoking status: Former Smoker    Packs/day: 1.00    Years: 5.00    Types: Cigarettes, Pipe, Cigars    Quit date: 08/05/1958  . Smokeless tobacco: Never Used  . Alcohol use No  . Drug use: No  . Sexual activity: Not Currently   Other Topics Concern  . Not on file   Social History Narrative   Originally from [place]. He has always lived in [place]. Prior travel to [place], [place], [place], [place] ,[place], & [place]. 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 Has adopted children. Has a dog currently. No bird exposure. No mold exposure. Enjoys reading & traveling.    Allergies  Allergen Reactions  . Codeine Other (See Comments)    anxiety  . Hydrocodone Other (See Comments)    anxiety  . Azithromycin Rash    Current  Outpatient Prescriptions  Medication Sig Dispense Refill  . acetaminophen (TYLENOL) 500 MG tablet Take 1,000 mg by mouth as needed for pain.     Marland Kitchen amLODipine (NORVASC) 10 MG tablet Take 10 mg by mouth daily.    Marland Kitchen aspirin EC 81 MG tablet Take 81 mg by mouth every morning.    Marland Kitchen atorvastatin (LIPITOR) 10 MG tablet Take 1 tablet (10 mg total) by mouth daily. 90 tablet 3  . b complex vitamins tablet Take 1 tablet by mouth daily.    . carvedilol (COREG) 25 MG tablet Take 1 tablet (25 mg total) by mouth 2 (two) times daily. 60 tablet 11  . Cholecalciferol (VITAMIN D-3) 1000 UNITS CAPS Take 2,000 Units by mouth daily.    Marland Kitchen CIALIS 20 MG tablet Take 20 mg by mouth daily as needed for erectile dysfunction.     . diphenhydrAMINE (BENADRYL) 25 mg capsule Take 25 mg by mouth every 6 (six) hours as needed for allergies.     Marland Kitchen ENTRESTO 24-26 MG TAKE ONE TABLET BY MOUTH TWICE DAILY 60 tablet 3  . fluocinonide cream (LIDEX) 6.38  % Apply 1 application topically 2 (two) times daily.    . furosemide (LASIX) 80 MG tablet TAKE ONE TABLET BY MOUTH ONCE DAILY 30 tablet 6  . Multiple Vitamin (MULTIVITAMIN) tablet Take 1 tablet by mouth daily.    Marland Kitchen testosterone cypionate (DEPOTESTOTERONE CYPIONATE) 100 MG/ML injection Inject 400 mg into the muscle every 21 ( twenty-one) days. For IM use only    . triamcinolone (NASACORT AQ) 55 MCG/ACT AERO nasal inhaler Place 2 sprays into the nose daily.    Marland Kitchen zolpidem (AMBIEN) 10 MG tablet TAKE ONE-HALF TO ONE TABLET BY MOUTH AT BEDTIME AS NEEDED FOR SLEEP 30 tablet 2  . metolazone (ZAROXOLYN) 5 MG tablet Take 1 tablet (5 mg total) by mouth daily as needed. For weight greater than 214 lbs. 90 tablet 3   Current Facility-Administered Medications  Medication Dose Route Frequency Provider Last Rate Last Dose  . omalizumab Arvid Right) injection 300 mg  300 mg Subcutaneous Q14 Days Javier Glazier, MD   300 mg at 06/10/16 1157  . omalizumab Arvid Right) injection 300 mg  300 mg Subcutaneous Q14 Days Javier Glazier, MD   300 mg at 06/24/16 1140  . omalizumab Arvid Right) injection 300 mg  300 mg Subcutaneous Q14 Days Javier Glazier, MD   300 mg at 07/08/16 1411  . omalizumab Arvid Right) injection 300 mg  300 mg Subcutaneous Q14 Days Javier Glazier, MD   300 mg at 07/22/16 1159  . omalizumab Arvid Right) injection 300 mg  300 mg Subcutaneous Q14 Days Deneise Lever, MD   300 mg at 08/06/16 1622  . omalizumab Arvid Right) injection 300 mg  300 mg Subcutaneous Q14 Days Javier Glazier, MD   300 mg at 08/20/16 1215    REVIEW OF SYSTEMS:  [X]  denotes positive finding, [ ]  denotes negative finding Cardiac  Comments:  Chest pain or chest pressure:    Shortness of breath upon exertion:    Short of breath when lying flat:    Irregular heart rhythm:        Vascular    Pain in calf, thigh, or hip brought on by ambulation:    Pain in feet at night that wakes you up from your sleep:     Blood clot in your veins:     Leg swelling:         Pulmonary  Oxygen at home:    Productive cough:     Wheezing:         Neurologic    Sudden weakness in arms or legs:     Sudden numbness in arms or legs:     Sudden onset of difficulty speaking or slurred speech:    Temporary loss of vision in one eye:     Problems with dizziness:         Gastrointestinal    Blood in stool:     Vomited blood:         Genitourinary    Burning when urinating:     Blood in urine:        Psychiatric    Major depression:         Hematologic    Bleeding problems:    Problems with blood clotting too easily:        Skin    Rashes or ulcers:        Constitutional    Fever or chills:      PHYSICAL EXAM: Vitals:   08/27/16 0852  BP: 95/61  Pulse: 64  Resp: 20  Temp: 97.4 F (36.3 C)  TempSrc: Oral  SpO2: 93%  Weight: 220 lb 3.2 oz (99.9 kg)  Height: 5\' 11"  (1.803 m)    GENERAL: The patient is a well-nourished male, in no acute distress. The vital signs are documented above. CARDIOVASCULAR: 2+ radial pulses bilaterally. Moderate-sized cephalic vein at the wrist bilaterally PULMONARY: There is good air exchange  ABDOMEN: Soft and non-tender  MUSCULOSKELETAL: There are no major deformities or cyanosis. NEUROLOGIC: No focal weakness or paresthesias are detected. SKIN: There are no ulcers or rashes noted. PSYCHIATRIC: The patient has a normal affect.  DATA:  Patient had the opportunity vein mapping on the right. Also arterial studies. Vein map shows his cephalic vein is approximately 3 mm throughout its course with a large basilic vein at the 6-7 mm from the antecubital space and proximally. He has normal triphasic waveforms on the right.  MEDICAL ISSUES: Chronic renal insufficiency signed today for discussion of AV access. Had a long discussion with the patient and his wife present regarding options for hemodialysis access. I discussed tunneled catheter, AV fistula and AV grafts and the limitations of each of  these. He currently is not on dialysis. He does have a left-sided pacemaker and so would avoid access in his left arm. He does appear to have cephalic vein acceptable for attempted fistula. I imaged this myself. He has Gal Smolinski branching at the distal forearm with a more dominant on the more lateral aspect does connect to his antecubital vein in his upper extremity cephalic vein. I have recommended right arm cephalic vein fistula. Also discussed that he has a very large basilic vein that this was required in the future should provide him an excellent opportunity as well. I discussed limitations of non-maturation and failure. He understands and wished to proceed. We will schedule this at his earliest convenience for outpatient surgery   Rosetta Posner, MD [facility] Vascular and Vein Specialists of [facility] Tel [phone] Pager [phone]

## 2016-09-09 NOTE — Interval H&P Note (Signed)
History and Physical Interval Note:  09/09/2016 7:01 AM  Shane [name]  has presented today for surgery, with the diagnosis of Stage IV Chronic Kidney Disease N18.4  The various methods of treatment have been discussed with the patient and family. After consideration of risks, benefits and other options for treatment, the patient has consented to  Procedure(s): ARTERIOVENOUS (AV) FISTULA CREATION (Right) as a surgical intervention .  The patient's history has been reviewed, patient examined, no change in status, stable for surgery.  I have reviewed the patient's chart and labs.  Questions were answered to the patient's satisfaction.     Curt Jews

## 2016-09-09 NOTE — Anesthesia Postprocedure Evaluation (Signed)
Anesthesia Post Note  Patient: Shane [name]  Procedure(s) Performed: Procedure(s) (LRB): BASCILIC VEIN TRANSPOSITION Right Arm (Right)  Patient location during evaluation: PACU Anesthesia Type: MAC Level of consciousness: awake Pain management: pain level controlled Respiratory status: spontaneous breathing Cardiovascular status: stable Anesthetic complications: no       Last Vitals:  Vitals:   09/09/16 0700  BP: 115/72  Pulse: 62  Resp: 20  Temp: 36.6 C    Last Pain:  Vitals:   09/09/16 0700  TempSrc: Oral                 Tayley Mudrick

## 2016-09-09 NOTE — Progress Notes (Signed)
Call to [employer] [phone]#, after several unsuccessful attempts to get in touch with Tomi Bamberger directly.

## 2016-09-09 NOTE — Transfer of Care (Signed)
Immediate Anesthesia Transfer of Care Note  Patient: Shane [name]  Procedure(s) Performed: Procedure(s): BASCILIC VEIN TRANSPOSITION Right Arm (Right)  Patient Location: PACU  Anesthesia Type:MAC  Level of Consciousness: awake, alert , oriented and patient cooperative  Airway & Oxygen Therapy: Patient Spontanous Breathing and Patient connected to face mask oxygen  Post-op Assessment: Report given to RN and Post -op Vital signs reviewed and stable  Post vital signs: Reviewed and stable  Last Vitals:  Vitals:   09/09/16 0700  BP: 115/72  Pulse: 62  Resp: 20  Temp: 36.6 C    Last Pain:  Vitals:   09/09/16 0700  TempSrc: Oral         Complications: No apparent anesthesia complications

## 2016-09-09 NOTE — Discharge Instructions (Signed)
° ° °  09/09/2016 Shane [name] [id number] 07/15/1934  Surgeon(s): Rosetta Posner, MD  Procedure(s): BASILIC VEIN TRANSPOSITION Right Arm  x Do not stick fistula for 12 weeks

## 2016-09-09 NOTE — OR Nursing (Signed)
Shane [name] arrived to pacu on a stretcher.  Pt has a new skin tear to right lateral forearm distal to incision.  Vaseline gauze/coban dressing applied.  AICD on and no post op interrogation required per Cardiology.  Pt is not currently receiving dialysis.

## 2016-09-09 NOTE — Anesthesia Preprocedure Evaluation (Signed)
Anesthesia Evaluation  Patient identified by MRN, date of birth, ID band Patient awake    Reviewed: Allergy & Precautions, NPO status , Patient's Chart, lab work & pertinent test results  Airway Mallampati: II  TM Distance: >3 FB     Dental   Pulmonary asthma , sleep apnea , pneumonia, former smoker,    breath sounds clear to auscultation       Cardiovascular hypertension, + CAD, + Past MI and +CHF  + dysrhythmias + Cardiac Defibrillator  Rhythm:Regular Rate:Normal     Neuro/Psych  Neuromuscular disease    GI/Hepatic negative GI ROS, Neg liver ROS,   Endo/Other  diabetes  Renal/GU Renal disease     Musculoskeletal  (+) Arthritis ,   Abdominal   Peds  Hematology   Anesthesia Other Findings   Reproductive/Obstetrics                             Anesthesia Physical Anesthesia Plan  ASA: III  Anesthesia Plan: MAC   Post-op Pain Management:    Induction: Intravenous  Airway Management Planned: Simple Face Mask  Additional Equipment:   Intra-op Plan:   Post-operative Plan:   Informed Consent: I have reviewed the patients History and Physical, chart, labs and discussed the procedure including the risks, benefits and alternatives for the proposed anesthesia with the patient or authorized representative who has indicated his/her understanding and acceptance.   Dental advisory given  Plan Discussed with: CRNA and Anesthesiologist  Anesthesia Plan Comments:         Anesthesia Quick Evaluation

## 2016-09-10 ENCOUNTER — Encounter (HOSPITAL_COMMUNITY): Payer: Self-pay | Admitting: Vascular Surgery

## 2016-09-10 ENCOUNTER — Other Ambulatory Visit: Payer: Self-pay | Admitting: *Deleted

## 2016-09-10 MED ORDER — AMLODIPINE BESYLATE 10 MG PO TABS: 10.0000 mg | ORAL_TABLET | Freq: Every day | ORAL | 2 refills | Status: DC

## 2016-09-10 MED ORDER — AMLODIPINE BESYLATE 10 MG PO TABS: 10.0000 mg | ORAL_TABLET | Freq: Every day | ORAL | 0 refills | Status: DC

## 2016-09-12 ENCOUNTER — Telehealth: Payer: Self-pay

## 2016-09-12 NOTE — Telephone Encounter (Signed)
rec'd phone call from pt.  Reported swelling of the right arm, from hand to just above elbow.  Reported there is a red-purple discoloration on top of the forearm.  Stated the incision is intact.  Denied drainage. Denied fever/ chills.  Advised to elevate the arm above level of heart intermittently, and to do AROM with fingers/hand at intervals.  Informed that the swelling is not unusual in the early post op period.  Also, advised the red/purple discoloration is likely related to his tendency to bruise more easily, with the ASA in his system.  Encouraged to monitor for any signs of infection, and call office with any concerns.  Verb. Understanding.

## 2016-09-15 DIAGNOSIS — H1131 Conjunctival hemorrhage, right eye: Secondary | ICD-10-CM | POA: Diagnosis not present

## 2016-09-15 LAB — HM DIABETES EYE EXAM

## 2016-09-17 ENCOUNTER — Ambulatory Visit (INDEPENDENT_AMBULATORY_CARE_PROVIDER_SITE_OTHER): Payer: Medicare Other

## 2016-09-17 ENCOUNTER — Telehealth: Payer: Self-pay | Admitting: Pulmonary Disease

## 2016-09-17 DIAGNOSIS — J4540 Moderate persistent asthma, uncomplicated: Secondary | ICD-10-CM | POA: Diagnosis not present

## 2016-09-17 NOTE — Telephone Encounter (Addendum)
#   vials:2 Ordered date:09/17/16 Shipping Date:09/17/16

## 2016-09-18 MED ORDER — OMALIZUMAB 150 MG ~~LOC~~ SOLR
300.0000 mg | SUBCUTANEOUS | Status: DC
  Administered 2016-09-17: 300 mg via SUBCUTANEOUS

## 2016-09-18 NOTE — Telephone Encounter (Signed)
#   Vials:2 Arrival Date:09/18/16 Lot #:[id number] Exp Date:9/21

## 2016-09-20 DIAGNOSIS — L570 Actinic keratosis: Secondary | ICD-10-CM | POA: Diagnosis not present

## 2016-09-20 DIAGNOSIS — L821 Other seborrheic keratosis: Secondary | ICD-10-CM | POA: Diagnosis not present

## 2016-09-20 DIAGNOSIS — D225 Melanocytic nevi of trunk: Secondary | ICD-10-CM | POA: Diagnosis not present

## 2016-09-20 DIAGNOSIS — D235 Other benign neoplasm of skin of trunk: Secondary | ICD-10-CM | POA: Diagnosis not present

## 2016-09-20 DIAGNOSIS — X32XXXD Exposure to sunlight, subsequent encounter: Secondary | ICD-10-CM | POA: Diagnosis not present

## 2016-09-23 DIAGNOSIS — I770 Arteriovenous fistula, acquired: Secondary | ICD-10-CM | POA: Diagnosis not present

## 2016-09-23 DIAGNOSIS — N184 Chronic kidney disease, stage 4 (severe): Secondary | ICD-10-CM | POA: Diagnosis not present

## 2016-09-23 DIAGNOSIS — I129 Hypertensive chronic kidney disease with stage 1 through stage 4 chronic kidney disease, or unspecified chronic kidney disease: Secondary | ICD-10-CM | POA: Diagnosis not present

## 2016-09-23 DIAGNOSIS — D649 Anemia, unspecified: Secondary | ICD-10-CM | POA: Diagnosis not present

## 2016-09-23 DIAGNOSIS — E669 Obesity, unspecified: Secondary | ICD-10-CM | POA: Diagnosis not present

## 2016-09-24 ENCOUNTER — Encounter: Payer: Self-pay | Admitting: Vascular Surgery

## 2016-09-24 ENCOUNTER — Encounter: Payer: Self-pay | Admitting: Physician Assistant

## 2016-09-24 ENCOUNTER — Other Ambulatory Visit: Payer: Self-pay | Admitting: Physician Assistant

## 2016-09-24 ENCOUNTER — Ambulatory Visit (INDEPENDENT_AMBULATORY_CARE_PROVIDER_SITE_OTHER): Payer: Self-pay | Admitting: Physician Assistant

## 2016-09-24 VITALS — BP 109/62 | HR 64 | Temp 97.6°F | Resp 20 | Ht 71.0 in | Wt 220.0 lb

## 2016-09-24 DIAGNOSIS — N184 Chronic kidney disease, stage 4 (severe): Secondary | ICD-10-CM

## 2016-09-24 NOTE — Progress Notes (Signed)
POST OPERATIVE OFFICE NOTE    CC:  F/u for surgery  HPI:  This is a 83 y.o. male who is s/p right BVT on 09/09/16 by Dr. Donnetta Hutching.  He comes in today with c/o redness and swelling of the right arm.  He states that it has gotten worse over the past few days.  He denies any fever or chills.  The areas of concern are where he had a couple of skin tears after surgery and unknown when this happened.  He saw Dr. Hollie Salk yesterday and had his renal function checked.  He is not yet on dialysis.  She gave him an Rx for Doxycycline, which he is taking.  He says he was given an rx for Tramadol, but has not used it and has been taking Tylenol.   Allergies  Allergen Reactions  . Codeine Other (See Comments)    anxiety  . Hydrocodone Other (See Comments)    anxiety  . Azithromycin Rash    Current Outpatient Prescriptions  Medication Sig Dispense Refill  . acetaminophen (TYLENOL) 500 MG tablet Take 1,000 mg by mouth every 8 (eight) hours as needed (for pain.).     Marland Kitchen amLODipine (NORVASC) 10 MG tablet Take 1 tablet (10 mg total) by mouth daily. 90 tablet 2  . aspirin EC 81 MG tablet Take 81 mg by mouth daily.     Marland Kitchen atorvastatin (LIPITOR) 10 MG tablet Take 1 tablet (10 mg total) by mouth daily. 90 tablet 3  . b complex vitamins tablet Take 1 tablet by mouth 3 (three) times a week.     . carvedilol (COREG) 25 MG tablet Take 1 tablet (25 mg total) by mouth 2 (two) times daily. 60 tablet 11  . cetirizine (ZYRTEC) 10 MG tablet Take 10 mg by mouth at bedtime as needed for allergies.    . Cholecalciferol (VITAMIN D-3) 1000 UNITS CAPS Take 2,000 Units by mouth 3 (three) times a week.     . CIALIS 20 MG tablet Take 20 mg by mouth daily as needed for erectile dysfunction. DO NOT EXCEED 2 DOSES WITHIN 7 DAYS    . diphenhydrAMINE (BENADRYL) 25 mg capsule Take 25 mg by mouth every 6 (six) hours as needed for allergies.     Marland Kitchen ENTRESTO 24-26 MG TAKE ONE TABLET BY MOUTH TWICE DAILY 60 tablet 3  . fluocinonide cream (LIDEX)  7.42 % Apply 1 application topically 2 (two) times daily as needed (for itchy rash).     . furosemide (LASIX) 80 MG tablet TAKE ONE TABLET BY MOUTH ONCE DAILY 30 tablet 6  . metolazone (ZAROXOLYN) 5 MG tablet Take 1 tablet (5 mg total) by mouth daily as needed. For weight greater than 214 lbs. 90 tablet 3  . Multiple Vitamin (MULTIVITAMIN WITH MINERALS) TABS tablet Take 1 tablet by mouth 3 (three) times a week.    Marland Kitchen omalizumab (XOLAIR) 150 MG injection Inject 300 mg into the skin every 14 (fourteen) days.    Marland Kitchen testosterone cypionate (DEPOTESTOTERONE CYPIONATE) 100 MG/ML injection Inject 400 mg into the muscle every 21 ( twenty-one) days. For IM use only    . traMADol (ULTRAM) 50 MG tablet Take 1 tablet (50 mg total) by mouth every 8 (eight) hours as needed. 10 tablet 0  . triamcinolone (NASACORT AQ) 55 MCG/ACT AERO nasal inhaler Place 2 sprays into the nose daily.    Marland Kitchen zolpidem (AMBIEN) 10 MG tablet TAKE ONE-HALF TO ONE TABLET BY MOUTH AT BEDTIME AS NEEDED FOR SLEEP (Patient  taking differently: TAKE ONE-HALF TABLET (5 MG) BY MOUTH AT BEDTIME AS NEEDED FOR SLEEP.) 30 tablet 2   Current Facility-Administered Medications  Medication Dose Route Frequency Provider Last Rate Last Dose  . omalizumab Arvid Right) injection 300 mg  300 mg Subcutaneous Q14 Days Javier Glazier, MD   300 mg at 09/03/16 1053  . omalizumab Arvid Right) injection 300 mg  300 mg Subcutaneous Q14 Days Javier Glazier, MD   300 mg at 09/17/16 0843     ROS:  See HPI  Physical Exam:  Vitals:   09/24/16 1309  BP: 109/62  Pulse: 64  Resp: 20    Incision:  All incisions are healing nicely. Extremities:  there is erythema over two areas that had skin tears on the right arm.  There is some swelling in the right arm down to the hand.  +thrill/bruit within the fistula.  He has an easily palpable right radial pulse.    Assessment/Plan:  This is a 83 y.o. male who is s/p: Right basilic vein transposition.  -incisions are healing  nicely -he does have some erythema around the two areas that had skin tear after surgery, which is away from the fistula.  He saw Dr. Hollie Salk yesterday and was give Rx for Doxycycline.  He will finish this out.  If the erythema does not improve and/or he starts running fevers, he will call us sooner.  He does have a follow up appointment with Dr. Donnetta Hutching on October 22, 2016 at 1145 -he does have some swelling in the right arm-Discussed with Dr. Donnetta Hutching after he examined pt and recommend arm elevation as swelling after surgery is common. -may use some cold compresses over area as well -of note, he uses the [facility] on [address] for short term medications and mail in for long term Rx.   Leontine Locket, PA-C [facility] [phone]  Clinic MD:  Early

## 2016-09-25 DIAGNOSIS — E291 Testicular hypofunction: Secondary | ICD-10-CM | POA: Diagnosis not present

## 2016-10-01 ENCOUNTER — Other Ambulatory Visit: Payer: Medicare Other

## 2016-10-01 ENCOUNTER — Ambulatory Visit: Payer: Medicare Other

## 2016-10-02 ENCOUNTER — Ambulatory Visit (INDEPENDENT_AMBULATORY_CARE_PROVIDER_SITE_OTHER): Payer: Medicare Other

## 2016-10-02 ENCOUNTER — Other Ambulatory Visit: Payer: Medicare Other

## 2016-10-02 ENCOUNTER — Other Ambulatory Visit: Payer: Medicare Other | Admitting: *Deleted

## 2016-10-02 DIAGNOSIS — I10 Essential (primary) hypertension: Secondary | ICD-10-CM

## 2016-10-02 DIAGNOSIS — J4520 Mild intermittent asthma, uncomplicated: Secondary | ICD-10-CM | POA: Diagnosis not present

## 2016-10-02 NOTE — Addendum Note (Signed)
Addended by: Eulis Foster on: 10/02/2016 03:01 PM   Modules accepted: Orders

## 2016-10-03 ENCOUNTER — Other Ambulatory Visit: Payer: Medicare Other

## 2016-10-03 LAB — BASIC METABOLIC PANEL
BUN/Creatinine Ratio: 27 — ABNORMAL HIGH (ref 10–24)
BUN: 66 mg/dL — ABNORMAL HIGH (ref 8–27)
CALCIUM: 9.1 mg/dL (ref 8.6–10.2)
CO2: 26 mmol/L (ref 18–29)
CREATININE: 2.43 mg/dL — AB (ref 0.76–1.27)
Chloride: 94 mmol/L — ABNORMAL LOW (ref 96–106)
GFR calc Af Amer: 28 mL/min/{1.73_m2} — ABNORMAL LOW (ref >59)
GFR, EST NON AFRICAN AMERICAN: 24 mL/min/{1.73_m2} — AB (ref >59)
Glucose: 183 mg/dL — ABNORMAL HIGH (ref 65–99)
Potassium: 3.6 mmol/L (ref 3.5–5.2)
Sodium: 139 mmol/L (ref 134–144)

## 2016-10-03 MED ORDER — OMALIZUMAB 150 MG ~~LOC~~ SOLR
300.0000 mg | SUBCUTANEOUS | Status: DC
  Administered 2016-10-02: 300 mg via SUBCUTANEOUS

## 2016-10-03 NOTE — Progress Notes (Signed)
Documentation of medication administration of Xolair and charges have been completed by Anarie Kalish, CMA based on the Xolair documentation sheet completed by Tammy Scott.  

## 2016-10-04 ENCOUNTER — Ambulatory Visit (INDEPENDENT_AMBULATORY_CARE_PROVIDER_SITE_OTHER): Payer: Medicare Other

## 2016-10-04 DIAGNOSIS — Z95810 Presence of automatic (implantable) cardiac defibrillator: Secondary | ICD-10-CM | POA: Diagnosis not present

## 2016-10-04 DIAGNOSIS — I5022 Chronic systolic (congestive) heart failure: Secondary | ICD-10-CM

## 2016-10-04 NOTE — Progress Notes (Signed)
EPIC Encounter for ICM Monitoring  Patient Name: Shane [name] is a 83 y.o. male Date: 10/04/2016 Primary Care Physican: [name], MD Primary Cardiologist:Nahser/Bensimhon Electrophysiologist: Caryl Comes Nephrologist: Hollie Salk at [facility] Weight: unknown Bi-V Pacing: 92.2%         Heart Failure questions reviewed, pt reported having some swelling and he takes Metolazone about once a week for symptoms.   Thoracic impedance at baseline today but was abnormal suggesting fluid accumulation within last month.  Prescribed confirmed dosage: Furosemide 80 mg 1 tablet daily and Metolazone 5 mg 1 tablet prn  Labs:  10/02/2016 Creatinine 2.43, BUN 66, Potassium 3.6, Sodium 139, EGFR 24-28 07/01/2016 Creatinine 3.09, BUN 74, Potassium 4.3, Sodium 136 06/07/2016 Creatinine 2.59, BUN 65, Potassium 3.6, Sodium 139, EGFR 22-25  05/01/2016 Creatinine 2.64, BUN 48, Potassium 3.8, Sodium 137  04/24/2016 Creatinine 2.46, BUN 38, Potassium 3.9, Sodium 138, EGFR 23-27  03/26/2016 Creatinine 2.28, BUN 35, Potassium 4.2, Sodium 140  03/07/2016 Creatinine 2.47, BUN 45, Potassium 4.1, Sodium 137 02/15/2016 Creatinine 2.72, BUN 55, Potassium 3.9, Sodium 135  02/05/2016 Creatinine 2.43, BUN 56, Potassium 4.5, Sodium 138  01/31/2016 Creatinine 3.36, BUN 77, Potassium 4.5, Sodium 135  11/16/2015 Creatinine 1.93, BUN 37, Potassium 4.3, Sodium 137  11/05/2016 Creatinine 2.21, BUN 53, Potassium 5.0, Sodium 138  Recommendations: No changes. Reminded to limit dietary salt intake to 2000 mg/day and fluid intake to < 2 liters/day. Encouraged to call for fluid symptoms.  Follow-up plan: ICM clinic phone appointment on 11/19/2016.  Defib office check with Dr Caryl Comes 10/17/2016 and HF clinic appt on 11/04/2016 with Dr Haroldine Laws  Copy of ICM check sent to device physician.   3 month ICM trend: 10/04/2016   1 Year ICM trend:      Rosalene Billings, RN 10/04/2016 7:45 AM

## 2016-10-09 ENCOUNTER — Other Ambulatory Visit: Payer: Self-pay | Admitting: *Deleted

## 2016-10-09 MED ORDER — CARVEDILOL 25 MG PO TABS: 25.0000 mg | ORAL_TABLET | Freq: Two times a day (BID) | ORAL | 0 refills | Status: DC

## 2016-10-10 ENCOUNTER — Encounter: Payer: Self-pay | Admitting: Internal Medicine

## 2016-10-11 ENCOUNTER — Encounter: Payer: Self-pay | Admitting: Vascular Surgery

## 2016-10-14 ENCOUNTER — Other Ambulatory Visit (HOSPITAL_COMMUNITY): Payer: Self-pay | Admitting: Cardiology

## 2016-10-14 MED ORDER — FUROSEMIDE 80 MG PO TABS: 80.0000 mg | ORAL_TABLET | Freq: Every day | ORAL | 3 refills | Status: DC

## 2016-10-14 MED ORDER — SACUBITRIL-VALSARTAN 24-26 MG PO TABS: 1.0000 | ORAL_TABLET | Freq: Two times a day (BID) | ORAL | 3 refills | Status: DC

## 2016-10-15 ENCOUNTER — Ambulatory Visit (HOSPITAL_COMMUNITY)
Admission: RE | Admit: 2016-10-15 | Discharge: 2016-10-15 | Disposition: A | Discharge status: Home / Self Care | Payer: Medicare Other | Source: Ambulatory Visit | Attending: Vascular Surgery | Admitting: Vascular Surgery

## 2016-10-15 ENCOUNTER — Telehealth: Payer: Self-pay | Admitting: Pulmonary Disease

## 2016-10-15 ENCOUNTER — Ambulatory Visit (INDEPENDENT_AMBULATORY_CARE_PROVIDER_SITE_OTHER): Payer: Self-pay | Admitting: Vascular Surgery

## 2016-10-15 DIAGNOSIS — I82A21 Chronic embolism and thrombosis of right axillary vein: Secondary | ICD-10-CM | POA: Insufficient documentation

## 2016-10-15 DIAGNOSIS — N184 Chronic kidney disease, stage 4 (severe): Secondary | ICD-10-CM

## 2016-10-15 DIAGNOSIS — Z4931 Encounter for adequacy testing for hemodialysis: Secondary | ICD-10-CM

## 2016-10-15 DIAGNOSIS — N186 End stage renal disease: Secondary | ICD-10-CM | POA: Diagnosis not present

## 2016-10-15 NOTE — Progress Notes (Signed)
Patient Care Team: [name], MD as PCP - General (Internal Medicine) Elsie Stain, MD as Consulting Physician (Pulmonary Disease) Madelon Lips, MD as Consulting Physician (Nephrology)   HPI  Shane [name] is a 83 y.o. male Seen in follow-up for CRT-D implanted 3/16.  He has a history of ischemic heart disease with remote bypass surgery  He presented 3/16 with ventricular tachycardia    Echo >> EF 35-40%, Akinesis and scarring of the basal-midinferolateral, inferior, and inferoseptal myocardium. PA pressure 79mmHg. LA moderately dilated.  Myoview>> . No reversible ischemia. Large fixed inferior an inferoapical wall defect, consistent with myocardial infarction. Severe inferior wall hypokinesis and left ventricular dilatation. Left ventricular ejection fraction 22%   He underwent CRT-D implantation in the context of left bundle branch block  He was also noted in hospital to have frequent ventricular ectopy.  He has renal insufficiency date  Cr  3/16 1.97  7/16 2.07  2/17 2.14  2/18 2.43   He has noted if anything some worsening in energy level following CRT implantation. At that hospitalization carvedilol was changed for metoprolol.  He  snores. He has daytime somnolence. He has never had a sleep study. But fatigue at this point is his biggest complaint. This is more than exercise intolerance he has some peripheral edema particularly in the morning.  He has been following up closely with renal.     Past Medical History:  Diagnosis Date  . Abnormal nuclear cardiac imaging test Nov 2010   moderate area of infarct in the inferior wall with only minimal reversibility and EF of 28%  . AICD (automatic cardioverter/defibrillator) present   . Allergic rhinitis 01-08-13   Uses nebulizer for chronic sinus issues and Mucinex.  . Arthritis    osteoarthritis   . Asthma    Extrinic  . Blood transfusion    ? at time of bypass surgery   . BPH (benign prostatic  hyperplasia)   . Cellulitis of arm, left    MSSA  . CHF (congestive heart failure) ([place])   . CKD (chronic kidney disease), stage III    "was told due to meds he takes"-no Renal workup done  . Colon polyps    adenomatous  . Coronary artery disease    remote CABG in 1985, cath in 2003 by Dr. Lia Foyer with no PCI, last nuclear in 2010 showing scar and EF of 28%.   . Diabetes mellitus   . Dyslipidemia   . Glaucoma 01-08-13   tx. eye drops  . HOH (hard of hearing)   . HTN (hypertension)   . Hypercholesterolemia   . Left ventricular dysfunction    28% per nuclear in 2010 and 35 to 40% per echo in 2010  . Myocardial infarction    1985  . Neuromuscular disorder ([place])    legs mild paralysis-able to walk"nerve damage"-legs- left leg brace   . Neuropathy ([place])   . Pneumonia    hx of several times years ago   . Spinal stenosis     Past Surgical History:  Procedure Laterality Date  . BACK SURGERY     hx of back surgery x 4   . BASCILIC VEIN TRANSPOSITION Right 09/09/2016   Procedure: BASCILIC VEIN TRANSPOSITION Right Arm;  Surgeon: Rosetta Posner, MD;  Location: [facility] OR;  Service: Vascular;  Laterality: Right;  . BI-VENTRICULAR IMPLANTABLE CARDIOVERTER DEFIBRILLATOR N/A 10/10/2014   Procedure: BI-VENTRICULAR IMPLANTABLE CARDIOVERTER DEFIBRILLATOR  (CRT-D);  Surgeon: [name], MD;  Location: [place] CATH LAB;  Service: Cardiovascular;  Laterality: N/A;  . CARDIAC CATHETERIZATION  2003  . Cataract      recent cataract surgery 6'14  . COLONOSCOPY N/A 01/25/2013   Procedure: COLONOSCOPY;  Surgeon: Irene Shipper, MD;  Location: [facility] ENDOSCOPY;  Service: Endoscopy;  Laterality: N/A;  . CORONARY ARTERY BYPASS GRAFT  1985   x 5 vessels  . LUMBAR DISC SURGERY    . PR POLYSOM 6/>YRS SLEEP 4/> ADDL PARAM ATTND  12/07/2015  . PROSTATE SURGERY    . TOTAL KNEE ARTHROPLASTY  08/01/2011   Procedure: TOTAL KNEE ARTHROPLASTY;  Surgeon: Johnn Hai;  Location: [facility] ORS;  Service: Orthopedics;  Laterality: Right;      Current Outpatient Prescriptions  Medication Sig Dispense Refill  . acetaminophen (TYLENOL) 500 MG tablet Take 1,000 mg by mouth every 8 (eight) hours as needed (for pain.).     Marland Kitchen amLODipine (NORVASC) 10 MG tablet Take 1 tablet (10 mg total) by mouth daily. 90 tablet 2  . aspirin EC 81 MG tablet Take 81 mg by mouth daily.     Marland Kitchen atorvastatin (LIPITOR) 10 MG tablet Take 1 tablet (10 mg total) by mouth daily. 90 tablet 3  . b complex vitamins tablet Take 1 tablet by mouth 3 (three) times a week.     . carvedilol (COREG) 25 MG tablet Take 1 tablet (25 mg total) by mouth 2 (two) times daily. 180 tablet 0  . cetirizine (ZYRTEC) 10 MG tablet Take 10 mg by mouth at bedtime as needed for allergies.    . Cholecalciferol (VITAMIN D-3) 1000 UNITS CAPS Take 2,000 Units by mouth 3 (three) times a week.     . CIALIS 20 MG tablet Take 20 mg by mouth daily as needed for erectile dysfunction. DO NOT EXCEED 2 DOSES WITHIN 7 DAYS    . diphenhydrAMINE (BENADRYL) 25 mg capsule Take 25 mg by mouth every 6 (six) hours as needed for allergies.     Marland Kitchen doxycycline (VIBRAMYCIN) 100 MG capsule     . fluocinonide cream (LIDEX) 4.16 % Apply 1 application topically 2 (two) times daily as needed (for itchy rash).     . furosemide (LASIX) 80 MG tablet Take 1 tablet (80 mg total) by mouth daily. 90 tablet 3  . metolazone (ZAROXOLYN) 5 MG tablet Take 1 tablet (5 mg total) by mouth daily as needed. For weight greater than 214 lbs. 90 tablet 3  . Multiple Vitamin (MULTIVITAMIN WITH MINERALS) TABS tablet Take 1 tablet by mouth 3 (three) times a week.    Marland Kitchen omalizumab (XOLAIR) 150 MG injection Inject 300 mg into the skin every 14 (fourteen) days.    . sacubitril-valsartan (ENTRESTO) 24-26 MG Take 1 tablet by mouth 2 (two) times daily. 180 tablet 3  . testosterone cypionate (DEPOTESTOTERONE CYPIONATE) 100 MG/ML injection Inject 400 mg into the muscle every 21 ( twenty-one) days. For IM use only    . traMADol (ULTRAM) 50 MG tablet  Take 1 tablet (50 mg total) by mouth every 8 (eight) hours as needed. 10 tablet 0  . triamcinolone (NASACORT AQ) 55 MCG/ACT AERO nasal inhaler Place 2 sprays into the nose daily.    Marland Kitchen zolpidem (AMBIEN) 10 MG tablet TAKE ONE-HALF TO ONE TABLET BY MOUTH AT BEDTIME AS NEEDED FOR SLEEP (Patient taking differently: TAKE ONE-HALF TABLET (5 MG) BY MOUTH AT BEDTIME AS NEEDED FOR SLEEP.) 30 tablet 2   Current Facility-Administered Medications  Medication Dose Route Frequency Provider Last Rate  Last Dose  . omalizumab Arvid Right) injection 300 mg  300 mg Subcutaneous Q14 Days Javier Glazier, MD   300 mg at 09/03/16 1053  . omalizumab Arvid Right) injection 300 mg  300 mg Subcutaneous Q14 Days Javier Glazier, MD   300 mg at 09/17/16 0843  . omalizumab Arvid Right) injection 300 mg  300 mg Subcutaneous Q14 Days Javier Glazier, MD   300 mg at 10/02/16 0757    Allergies  Allergen Reactions  . Codeine Other (See Comments)    anxiety  . Hydrocodone Other (See Comments)    anxiety  . Azithromycin Rash    Review of Systems negative except from HPI and PMH  Physical Exam BP (!) 146/80   Pulse 68   Ht 5\' 10"  (1.778 m)   Wt 223 lb 9.6 oz (101.4 kg)   SpO2 96%   BMI 32.08 kg/m  Well developed and well nourished in no acute distress HENT normal E scleral and icterus clear Neck Supple JVP 8; carotids brisk and full Device pocket well healed; without hematoma or erythema.  There is no tethering  Clear to ausculation regular rate and rhythm, no murmurs gallops or rub Soft with active bowel sounds No clubbing cyanosis 2+>1+ Edema Alert and oriented, grossly normal motor and sensory function Skin Warm and Dry  ECG was ordered today demonstrated sinus rhythm at 69 with P-synchronous/ AV  Pacing QRS complexes negative lead V1 upright V1 a QRS duration is considerably narrow or than pre-CRT  Assessment and  Plan  Ischemic cardiomyopathy  Congestive heart failure-chronic-systolic  Left  Bundle-branch block  CRT-D-[employer]  The patient's device was interrogated and the information was fully reviewed.  The device was reprogrammed to improve RV pacing outputs albeit he is in adaptive pacing mode  Hypertension   Obstructive sleep apnea/sleep disorder breathing  Renal insufficiency grade 3  Cr 2.14  2/17    CrCL 38   Amlodipine may be contributing to his edema; however, given his kidney disease the only other alternative I think would be hydralazine. I have reached up to his nephrologist and will follow her guidance.  The potential for drug interaction and worsening renal function is very great  It may well also be in the context of his cardiomyopathy that hydralazine/nitrate would be a better choice than Entresto. Again will defer to her expertise. He is not a candidate for Aldactone given his renal dysfunction.  His blood pressure is elevated, up titration of his carvedilol could be considered toward 50 mg twice daily given his size.  I think is very important that he get a sleep study. I will for this to his primary care physician.  Without symptoms of ischemia

## 2016-10-15 NOTE — Progress Notes (Signed)
Patient name: Shane [name] MRN: [id number] DOB: [birth year] Sex: male  REASON FOR VISIT: Follow-up recent right arm basilic vein transposition fistula  HPI: Shane [name] is a 83 y.o. male here today for follow-up. He is 5 weeks status post basilic vein transposition. This was all in a single stage. He has no steal symptoms. He is currently not on hemodialysis  Current Outpatient Prescriptions  Medication Sig Dispense Refill  . acetaminophen (TYLENOL) 500 MG tablet Take 1,000 mg by mouth every 8 (eight) hours as needed (for pain.).     Marland Kitchen amLODipine (NORVASC) 10 MG tablet Take 1 tablet (10 mg total) by mouth daily. 90 tablet 2  . aspirin EC 81 MG tablet Take 81 mg by mouth daily.     Marland Kitchen atorvastatin (LIPITOR) 10 MG tablet Take 1 tablet (10 mg total) by mouth daily. 90 tablet 3  . b complex vitamins tablet Take 1 tablet by mouth 3 (three) times a week.     . carvedilol (COREG) 25 MG tablet Take 1 tablet (25 mg total) by mouth 2 (two) times daily. 180 tablet 0  . cetirizine (ZYRTEC) 10 MG tablet Take 10 mg by mouth at bedtime as needed for allergies.    . Cholecalciferol (VITAMIN D-3) 1000 UNITS CAPS Take 2,000 Units by mouth 3 (three) times a week.     . CIALIS 20 MG tablet Take 20 mg by mouth daily as needed for erectile dysfunction. DO NOT EXCEED 2 DOSES WITHIN 7 DAYS    . diphenhydrAMINE (BENADRYL) 25 mg capsule Take 25 mg by mouth every 6 (six) hours as needed for allergies.     Marland Kitchen doxycycline (VIBRAMYCIN) 100 MG capsule     . fluocinonide cream (LIDEX) 2.29 % Apply 1 application topically 2 (two) times daily as needed (for itchy rash).     . furosemide (LASIX) 80 MG tablet Take 1 tablet (80 mg total) by mouth daily. 90 tablet 3  . metolazone (ZAROXOLYN) 5 MG tablet Take 1 tablet (5 mg total) by mouth daily as needed. For weight greater than 214 lbs. 90 tablet 3  . Multiple Vitamin (MULTIVITAMIN WITH MINERALS) TABS tablet Take 1 tablet by mouth 3  (three) times a week.    Marland Kitchen omalizumab (XOLAIR) 150 MG injection Inject 300 mg into the skin every 14 (fourteen) days.    . sacubitril-valsartan (ENTRESTO) 24-26 MG Take 1 tablet by mouth 2 (two) times daily. 180 tablet 3  . testosterone cypionate (DEPOTESTOTERONE CYPIONATE) 100 MG/ML injection Inject 400 mg into the muscle every 21 ( twenty-one) days. For IM use only    . traMADol (ULTRAM) 50 MG tablet Take 1 tablet (50 mg total) by mouth every 8 (eight) hours as needed. 10 tablet 0  . triamcinolone (NASACORT AQ) 55 MCG/ACT AERO nasal inhaler Place 2 sprays into the nose daily.    Marland Kitchen zolpidem (AMBIEN) 10 MG tablet TAKE ONE-HALF TO ONE TABLET BY MOUTH AT BEDTIME AS NEEDED FOR SLEEP (Patient taking differently: TAKE ONE-HALF TABLET (5 MG) BY MOUTH AT BEDTIME AS NEEDED FOR SLEEP.) 30 tablet 2   Current Facility-Administered Medications  Medication Dose Route Frequency Provider Last Rate Last Dose  . omalizumab Arvid Right) injection 300 mg  300 mg Subcutaneous Q14 Days Javier Glazier, MD   300 mg at 09/03/16 1053  . omalizumab Arvid Right) injection 300 mg  300 mg Subcutaneous Q14 Days Javier Glazier, MD   300 mg at 09/17/16 0843  . omalizumab Arvid Right) injection 300 mg  300 mg Subcutaneous Q14 Days Javier Glazier, MD   300 mg at 10/02/16 1914     PHYSICAL EXAM: There were no vitals filed for this visit.  GENERAL: The patient is a well-nourished male, in no acute distress. The vital signs are documented above. Well-healed incisions in his right antecubital space and axilla. Vein size is very nice but does have water hammer pulsatile nature. Minimal thrill  Duplex of his graft also shows good size but appeared stenosis at the axillary vein  MEDICAL ISSUES: Discussed this with the patient and his wife present. Have recommended shuntogram to determine if intervention is possible in the axillary vein. Discussed possible angioplasty and even stenting. We'll plan for this on Thursday with Dr.  Florencia Reasons, MD [facility] Tel [phone] Pager [phone]

## 2016-10-16 ENCOUNTER — Other Ambulatory Visit: Payer: Self-pay | Admitting: Vascular Surgery

## 2016-10-16 ENCOUNTER — Ambulatory Visit (INDEPENDENT_AMBULATORY_CARE_PROVIDER_SITE_OTHER): Payer: Medicare Other

## 2016-10-16 ENCOUNTER — Other Ambulatory Visit: Payer: Self-pay

## 2016-10-16 DIAGNOSIS — J4540 Moderate persistent asthma, uncomplicated: Secondary | ICD-10-CM | POA: Diagnosis not present

## 2016-10-16 DIAGNOSIS — E291 Testicular hypofunction: Secondary | ICD-10-CM | POA: Diagnosis not present

## 2016-10-16 NOTE — Telephone Encounter (Signed)
#   Vials:4 Arrival Date:10/16/16 Lot #:[id number] Exp Date:9/21

## 2016-10-16 NOTE — Telephone Encounter (Signed)
#   vials:4 Ordered date:10/15/16 Shipping Date:10/15/16

## 2016-10-17 ENCOUNTER — Ambulatory Visit (INDEPENDENT_AMBULATORY_CARE_PROVIDER_SITE_OTHER): Payer: Medicare Other | Admitting: Internal Medicine

## 2016-10-17 ENCOUNTER — Other Ambulatory Visit (HOSPITAL_COMMUNITY): Payer: Self-pay | Admitting: *Deleted

## 2016-10-17 ENCOUNTER — Ambulatory Visit (HOSPITAL_COMMUNITY)
Admission: RE | Admit: 2016-10-17 | Discharge: 2016-10-17 | Disposition: A | Discharge status: Home / Self Care | Payer: Medicare Other | Source: Ambulatory Visit | Attending: Vascular Surgery | Admitting: Vascular Surgery

## 2016-10-17 ENCOUNTER — Encounter: Payer: Self-pay | Admitting: Internal Medicine

## 2016-10-17 ENCOUNTER — Encounter (HOSPITAL_COMMUNITY)
Admission: RE | Disposition: A | Discharge status: Home / Self Care | Payer: Self-pay | Source: Ambulatory Visit | Attending: Vascular Surgery

## 2016-10-17 VITALS — BP 146/80 | HR 68 | Ht 70.0 in | Wt 223.6 lb

## 2016-10-17 DIAGNOSIS — I255 Ischemic cardiomyopathy: Secondary | ICD-10-CM

## 2016-10-17 DIAGNOSIS — Z992 Dependence on renal dialysis: Secondary | ICD-10-CM | POA: Diagnosis not present

## 2016-10-17 DIAGNOSIS — N184 Chronic kidney disease, stage 4 (severe): Secondary | ICD-10-CM | POA: Diagnosis present

## 2016-10-17 DIAGNOSIS — I447 Left bundle-branch block, unspecified: Secondary | ICD-10-CM | POA: Diagnosis not present

## 2016-10-17 DIAGNOSIS — I5022 Chronic systolic (congestive) heart failure: Secondary | ICD-10-CM | POA: Diagnosis not present

## 2016-10-17 DIAGNOSIS — T82858A Stenosis of vascular prosthetic devices, implants and grafts, initial encounter: Secondary | ICD-10-CM | POA: Insufficient documentation

## 2016-10-17 DIAGNOSIS — N186 End stage renal disease: Secondary | ICD-10-CM | POA: Diagnosis not present

## 2016-10-17 DIAGNOSIS — T82898A Other specified complication of vascular prosthetic devices, implants and grafts, initial encounter: Secondary | ICD-10-CM | POA: Diagnosis not present

## 2016-10-17 DIAGNOSIS — I2589 Other forms of chronic ischemic heart disease: Secondary | ICD-10-CM

## 2016-10-17 DIAGNOSIS — Z7951 Long term (current) use of inhaled steroids: Secondary | ICD-10-CM | POA: Insufficient documentation

## 2016-10-17 DIAGNOSIS — Z95810 Presence of automatic (implantable) cardiac defibrillator: Secondary | ICD-10-CM | POA: Diagnosis not present

## 2016-10-17 DIAGNOSIS — Z7982 Long term (current) use of aspirin: Secondary | ICD-10-CM | POA: Diagnosis not present

## 2016-10-17 DIAGNOSIS — Y832 Surgical operation with anastomosis, bypass or graft as the cause of abnormal reaction of the patient, or of later complication, without mention of misadventure at the time of the procedure: Secondary | ICD-10-CM | POA: Insufficient documentation

## 2016-10-17 HISTORY — PX: PERIPHERAL VASCULAR BALLOON ANGIOPLASTY: CATH118281

## 2016-10-17 HISTORY — PX: A/V FISTULAGRAM: CATH118298

## 2016-10-17 LAB — POCT I-STAT, CHEM 8
BUN: 63 mg/dL — AB (ref 6–20)
CALCIUM ION: 1.05 mmol/L — AB (ref 1.15–1.40)
CREATININE: 2.7 mg/dL — AB (ref 0.61–1.24)
Chloride: 98 mmol/L — ABNORMAL LOW (ref 101–111)
Glucose, Bld: 125 mg/dL — ABNORMAL HIGH (ref 65–99)
HCT: 44 % (ref 39.0–52.0)
Hemoglobin: 15 g/dL (ref 13.0–17.0)
Potassium: 3.6 mmol/L (ref 3.5–5.1)
SODIUM: 138 mmol/L (ref 135–145)
TCO2: 27 mmol/L (ref 0–100)

## 2016-10-17 LAB — CUP PACEART INCLINIC DEVICE CHECK
Battery Remaining Longevity: 74 mo
Brady Statistic RA Percent Paced: 1 %
HighPow Impedance: 61 Ohm
Implantable Lead Implant Date: 20160308
Implantable Lead Implant Date: 20160308
Implantable Lead Location: 753859
Implantable Lead Location: 753860
Implantable Lead Model: 4598
Implantable Lead Model: 5076
Lead Channel Impedance Value: 494 Ohm
Lead Channel Pacing Threshold Amplitude: 0.5 V
Lead Channel Pacing Threshold Amplitude: 0.75 V
Lead Channel Pacing Threshold Amplitude: 1 V
Lead Channel Pacing Threshold Pulse Width: 0.4 ms
Lead Channel Pacing Threshold Pulse Width: 0.6 ms
Lead Channel Sensing Intrinsic Amplitude: 12.6 mV
Lead Channel Sensing Intrinsic Amplitude: 3.6 mV
Lead Channel Setting Pacing Amplitude: 2.5 V
Lead Channel Setting Pacing Pulse Width: 0.8 ms
Lead Channel Setting Sensing Sensitivity: 0.3 mV
MDC IDC LEAD IMPLANT DT: 20160308
MDC IDC LEAD LOCATION: 753858
MDC IDC MSMT LEADCHNL RA IMPEDANCE VALUE: 399 Ohm
MDC IDC MSMT LEADCHNL RV IMPEDANCE VALUE: 513 Ohm
MDC IDC MSMT LEADCHNL RV PACING THRESHOLD PULSEWIDTH: 0.8 ms
MDC IDC PG IMPLANT DT: 20160308
MDC IDC SESS DTM: 20180315105303
MDC IDC SET LEADCHNL LV PACING AMPLITUDE: 2 V
MDC IDC SET LEADCHNL LV PACING PULSEWIDTH: 0.6 ms
MDC IDC SET LEADCHNL RA PACING AMPLITUDE: 2 V
MDC IDC STAT BRADY RV PERCENT PACED: 93 %

## 2016-10-17 SURGERY — A/V FISTULAGRAM
Anesthesia: LOCAL | Laterality: Right

## 2016-10-17 MED ORDER — HEPARIN (PORCINE) IN NACL 2-0.9 UNIT/ML-% IJ SOLN
INTRAMUSCULAR | Status: AC
  Filled 2016-10-17: qty 1000

## 2016-10-17 MED ORDER — LIDOCAINE HCL (PF) 1 % IJ SOLN
INTRAMUSCULAR | Status: DC | PRN
  Administered 2016-10-17: 5 mL

## 2016-10-17 MED ORDER — SODIUM CHLORIDE 0.9% FLUSH: 3.0000 mL | INTRAVENOUS | Status: DC | PRN

## 2016-10-17 MED ORDER — LIDOCAINE HCL (PF) 1 % IJ SOLN
INTRAMUSCULAR | Status: AC
  Filled 2016-10-17: qty 30

## 2016-10-17 MED ORDER — SODIUM CHLORIDE 0.9 % IV SOLN: 1.0000 mL/kg/h | INTRAVENOUS | Status: DC

## 2016-10-17 MED ORDER — IODIXANOL 320 MG/ML IV SOLN
INTRAVENOUS | Status: DC | PRN
  Administered 2016-10-17: 35 mL via INTRAVENOUS

## 2016-10-17 MED ORDER — HEPARIN (PORCINE) IN NACL 2-0.9 UNIT/ML-% IJ SOLN
INTRAMUSCULAR | Status: DC | PRN
  Administered 2016-10-17: 1000 mL

## 2016-10-17 MED ORDER — HEPARIN SODIUM (PORCINE) 1000 UNIT/ML IJ SOLN
INTRAMUSCULAR | Status: AC
  Filled 2016-10-17: qty 1

## 2016-10-17 MED ORDER — OMALIZUMAB 150 MG ~~LOC~~ SOLR
300.0000 mg | SUBCUTANEOUS | Status: DC
  Administered 2016-10-16: 300 mg via SUBCUTANEOUS

## 2016-10-17 MED ORDER — HEPARIN SODIUM (PORCINE) 1000 UNIT/ML IJ SOLN
INTRAMUSCULAR | Status: DC | PRN
  Administered 2016-10-17: 3000 [IU] via INTRAVENOUS

## 2016-10-17 MED ORDER — ACETAMINOPHEN 325 MG PO TABS: 650.0000 mg | ORAL_TABLET | ORAL | Status: DC | PRN

## 2016-10-17 SURGICAL SUPPLY — 20 items
BAG SNAP BAND KOVER 36X36 (MISCELLANEOUS) ×2 IMPLANT
BALLN ARMADA 8X60X80 (BALLOONS) ×2
BALLN MUSTANG 6X80X75 (BALLOONS) ×2
BALLOON ARMADA 8X60X80 (BALLOONS) ×1 IMPLANT
BALLOON MUSTANG 6X80X75 (BALLOONS) ×1 IMPLANT
CATH STRAIGHT 5FR 65CM (CATHETERS) ×2 IMPLANT
COVER DOME SNAP 22 D (MISCELLANEOUS) ×2 IMPLANT
COVER PRB 48X5XTLSCP FOLD TPE (BAG) ×1 IMPLANT
COVER PROBE 5X48 (BAG) ×2
DEVICE TORQUE .025-.038 (MISCELLANEOUS) ×2 IMPLANT
GUIDEWIRE ANGLED .035X150CM (WIRE) ×2 IMPLANT
KIT ENCORE 26 ADVANTAGE (KITS) ×2 IMPLANT
PROTECTION STATION PRESSURIZED (MISCELLANEOUS) ×2
SHEATH PINNACLE R/O II 6F 4CM (SHEATH) ×2 IMPLANT
STATION PROTECTION PRESSURIZED (MISCELLANEOUS) ×1 IMPLANT
STOPCOCK MORSE 400PSI 3WAY (MISCELLANEOUS) ×2 IMPLANT
TRAY PV CATH (CUSTOM PROCEDURE TRAY) ×2 IMPLANT
TUBING CIL FLEX 10 FLL-RA (TUBING) ×2 IMPLANT
WIRE BENTSON .035X145CM (WIRE) ×2 IMPLANT
WIRE MINI STICK MAX (SHEATH) ×2 IMPLANT

## 2016-10-17 NOTE — Discharge Instructions (Signed)
Fistulogram, Care After °Refer to this sheet in the next few weeks. These instructions provide you with information on caring for yourself after your procedure. Your health care provider may also give you more specific instructions. Your treatment has been planned according to current medical practices, but problems sometimes occur. Call your health care provider if you have any problems or questions after your procedure. °What can I expect after the procedure? °After your procedure, it is typical to have the following: °· A small amount of discomfort in the area where the catheters were placed. °· A small amount of bruising around the fistula. °· Sleepiness and fatigue. °Follow these instructions at home: °· Rest at home for the day following your procedure. °· Do not drive or operate heavy machinery while taking pain medicine. °· Take medicines only as directed by your health care provider. °· Do not take baths, swim, or use a hot tub until your health care provider approves. You may shower 24 hours after the procedure or as directed by your health care provider. °· There are many different ways to close and cover an incision, including stitches, skin glue, and adhesive strips. Follow your health care provider's instructions on: °¨ Incision care. °¨ Bandage (dressing) changes and removal. °¨ Incision closure removal. °· Monitor your dialysis fistula carefully. °Contact a health care provider if: °· You have drainage, redness, swelling, or pain at your catheter site. °· You have a fever. °· You have chills. °Get help right away if: °· You feel weak. °· You have trouble balancing. °· You have trouble moving your arms or legs. °· You have problems with your speech or vision. °· You can no longer feel a vibration or buzz when you put your fingers over your dialysis fistula. °· The limb that was used for the procedure: °¨ Swells. °¨ Is painful. °¨ Is cold. °¨ Is discolored, such as blue or pale white. °This information  is not intended to replace advice given to you by your health care provider. Make sure you discuss any questions you have with your health care provider. °Document Released: 12/06/2013 Document Revised: 12/28/2015 Document Reviewed: 09/10/2013 °Elsevier Interactive Patient Education © 2017 Elsevier Inc. ° °

## 2016-10-17 NOTE — Progress Notes (Signed)
Xolair injection documentation and charges entered by Rozelia Catapano, RMA, based on injection sheet filled out by Tammy Scott per office protocol.   

## 2016-10-17 NOTE — H&P (View-Only) (Signed)
Patient name: Shane [name] MRN: [id number] DOB: Apr 21, 1934 Sex: male  REASON FOR VISIT: Follow-up recent right arm basilic vein transposition fistula  HPI: Shane [name] is a 83 y.o. male here today for follow-up. He is 5 weeks status post basilic vein transposition. This was all in a single stage. He has no steal symptoms. He is currently not on hemodialysis  Current Outpatient Prescriptions  Medication Sig Dispense Refill  . acetaminophen (TYLENOL) 500 MG tablet Take 1,000 mg by mouth every 8 (eight) hours as needed (for pain.).     Marland Kitchen amLODipine (NORVASC) 10 MG tablet Take 1 tablet (10 mg total) by mouth daily. 90 tablet 2  . aspirin EC 81 MG tablet Take 81 mg by mouth daily.     Marland Kitchen atorvastatin (LIPITOR) 10 MG tablet Take 1 tablet (10 mg total) by mouth daily. 90 tablet 3  . b complex vitamins tablet Take 1 tablet by mouth 3 (three) times a week.     . carvedilol (COREG) 25 MG tablet Take 1 tablet (25 mg total) by mouth 2 (two) times daily. 180 tablet 0  . cetirizine (ZYRTEC) 10 MG tablet Take 10 mg by mouth at bedtime as needed for allergies.    . Cholecalciferol (VITAMIN D-3) 1000 UNITS CAPS Take 2,000 Units by mouth 3 (three) times a week.     . CIALIS 20 MG tablet Take 20 mg by mouth daily as needed for erectile dysfunction. DO NOT EXCEED 2 DOSES WITHIN 7 DAYS    . diphenhydrAMINE (BENADRYL) 25 mg capsule Take 25 mg by mouth every 6 (six) hours as needed for allergies.     Marland Kitchen doxycycline (VIBRAMYCIN) 100 MG capsule     . fluocinonide cream (LIDEX) 2.70 % Apply 1 application topically 2 (two) times daily as needed (for itchy rash).     . furosemide (LASIX) 80 MG tablet Take 1 tablet (80 mg total) by mouth daily. 90 tablet 3  . metolazone (ZAROXOLYN) 5 MG tablet Take 1 tablet (5 mg total) by mouth daily as needed. For weight greater than 214 lbs. 90 tablet 3  . Multiple Vitamin (MULTIVITAMIN WITH MINERALS) TABS tablet Take 1 tablet by mouth 3  (three) times a week.    Marland Kitchen omalizumab (XOLAIR) 150 MG injection Inject 300 mg into the skin every 14 (fourteen) days.    . sacubitril-valsartan (ENTRESTO) 24-26 MG Take 1 tablet by mouth 2 (two) times daily. 180 tablet 3  . testosterone cypionate (DEPOTESTOTERONE CYPIONATE) 100 MG/ML injection Inject 400 mg into the muscle every 21 ( twenty-one) days. For IM use only    . traMADol (ULTRAM) 50 MG tablet Take 1 tablet (50 mg total) by mouth every 8 (eight) hours as needed. 10 tablet 0  . triamcinolone (NASACORT AQ) 55 MCG/ACT AERO nasal inhaler Place 2 sprays into the nose daily.    Marland Kitchen zolpidem (AMBIEN) 10 MG tablet TAKE ONE-HALF TO ONE TABLET BY MOUTH AT BEDTIME AS NEEDED FOR SLEEP (Patient taking differently: TAKE ONE-HALF TABLET (5 MG) BY MOUTH AT BEDTIME AS NEEDED FOR SLEEP.) 30 tablet 2   Current Facility-Administered Medications  Medication Dose Route Frequency Provider Last Rate Last Dose  . omalizumab Arvid Right) injection 300 mg  300 mg Subcutaneous Q14 Days Javier Glazier, MD   300 mg at 09/03/16 1053  . omalizumab Arvid Right) injection 300 mg  300 mg Subcutaneous Q14 Days Javier Glazier, MD   300 mg at 09/17/16 0843  . omalizumab Arvid Right) injection 300 mg  300 mg Subcutaneous Q14 Days Javier Glazier, MD   300 mg at 10/02/16 5927     PHYSICAL EXAM: There were no vitals filed for this visit.  GENERAL: The patient is a well-nourished male, in no acute distress. The vital signs are documented above. Well-healed incisions in his right antecubital space and axilla. Vein size is very nice but does have water hammer pulsatile nature. Minimal thrill  Duplex of his graft also shows good size but appeared stenosis at the axillary vein  MEDICAL ISSUES: Discussed this with the patient and his wife present. Have recommended shuntogram to determine if intervention is possible in the axillary vein. Discussed possible angioplasty and even stenting. We'll plan for this on Thursday with Dr.  Florencia Reasons, MD [facility] Tel [phone] Pager [phone]

## 2016-10-17 NOTE — Patient Instructions (Addendum)
Medication Instructions: Your physician recommends that you continue on your current medications as directed. Please refer to the Current Medication list given to you today.   Labwork: None Ordered  Procedures/Testing: None Ordered  Follow-Up: Your physician wants you to follow-up in: 1 YEAR with Dr. Caryl Comes. You will receive a reminder letter in the mail two months in advance. If you don't receive a letter, please call our office to schedule the follow-up appointment.  Remote monitoring is used to monitor your ICD from home. This monitoring reduces the number of office visits required to check your device to one time per year. It allows [place] to keep an eye on the functioning of your device to ensure it is working properly. You are scheduled for a device check from home on 01/16/17. You may send your transmission at any time that day. If you have a wireless device, the transmission will be sent automatically. After your physician reviews your transmission, you will receive a postcard with your next transmission date.    Any Additional Special Instructions Will Be Listed Below (If Applicable).     If you need a refill on your cardiac medications before your next appointment, please call your pharmacy.

## 2016-10-17 NOTE — Interval H&P Note (Signed)
History and Physical Interval Note:  10/17/2016 9:37 AM  Shane [name]  has presented today for surgery, with the diagnosis of end stage renal  The various methods of treatment have been discussed with the patient and family. After consideration of risks, benefits and other options for treatment, the patient has consented to  Procedure(s): A/V Fistulagram (Right) as a surgical intervention .  The patient's history has been reviewed, patient examined, no change in status, stable for surgery.  I have reviewed the patient's chart and labs.  Questions were answered to the patient's satisfaction.     Adele Barthel

## 2016-10-17 NOTE — Op Note (Signed)
  OPERATIVE NOTE   PROCEDURE: 1.  right basilic vein transposition cannulation under ultrasound guidance 2.  right arm shuntogram 3.  Venoplasty of basilic vein x 2 (6 mm x 60 mm, 8 mm x 60 mm) 4.  Venoplasty of right innominate vein (8 mm x 60 mm)   PRE-OPERATIVE DIAGNOSIS: right basilic vein transposition with concern for central venous occlusion   POST-OPERATIVE DIAGNOSIS: same as above   SURGEON: Brian Chen, MD  ANESTHESIA: local  ESTIMATED BLOOD LOSS: 50 cc  FINDING(S): 1. Patent fistula with two high grade stenoses as documented below  Sub-total occlusion of high basilic vein: sub-total occlusion prior to venoplasty, 5.5 mm lumen after serial venoplasty  Patent right innominate vein with 50-70% stenosis (3 mm): <30% residual stenosis (5 mm lumen) at end of case  2. Widely patent right subclavian vein and superior vena cava   SPECIMEN(S):  None  CONTRAST: 35 cc   INDICATIONS: Shane [name] is a 83 y.o. male who presents with right basilic vein transposition with concern for central venous occlusion.  The patient is scheduled for right arm shuntogram.  The patient is aware of that the risks of an angiographic procedure include but are not limited to: bleeding, infection, access site complications, thrombosis of access, renal failure, embolization, rupture of vessel, dissection, arteriovenous fistula, possible need for emergent surgical intervention, possible need for surgical procedures to treat the patient's pathology, anaphylactic reaction to contrast, and stroke and death.  The patient is aware of the risks of the procedure and elects to proceed forward.   DESCRIPTION: After full informed written consent was obtained, the patient was brought back to the angiography suite and placed supine upon the angiography table.  The patient was connected to monitoring equipment.  The right upper arm was prepped and draped in the standard fashion for a percutaneous access  intervention.  Under ultrasound guidance, the right basilic vein transposition was cannulated with a micropuncture needle.  The microwire was advanced into the fistula and the needle was exchanged for the a microsheath, which was lodged 2 cm into the access.  The wire was removed and the sheath was connected to the IV extension tubing.  Hand injections were completed to image the access from the cannulation site up the right atrium.  Based on the images, this patient will need: attempt at venoplasty.  The patient was given 3000 units of Heparin intravenously to obtain some anticoagulation.  A Bentson wire was advanced into the basilic vein and the sheath was exchanged for a short 6-Fr sheath.  The wire would not advance centrally so a straight catheter was placed over the wire and then the wire exchanged for a glidewire.  Using this combination, I navigated through the sub-total occlusion in the high basilic vein.  I advanced the wire into the superior vena cava and then exchanged the wire for a Bentson wire.  Based on the imaging, a 6 mm x 60 mm angioplasty balloon was selected.  The balloon was centered around the basilic vein stenosis and inflated to 14 ATM for 2 minutes.  The balloon was deflated and removed off the wire.  On completion imaging, a >30% residual stenosis was present and a 50-70% stenosis in right innominate vein was noted.    At this point, the 8 mm x 60 mm angioplasty balloon was placed over the wire.  The balloon was centered around the basilic vein stenosis and inflated to 12 ATM for 2 minutes.  I then   advanced the balloon into the right innominate vein and inflated the balloon at 12 atm for 2 minutes.  The balloon was deflated and removed off the wire.  On completion imaging, there was a <30% residual stenosis in the basilic vein.  In the right innominate vein, there was a >30% stenosis present.  I replaced the wire in the right innominate vein and inflated to 14 atm for 3 minutes.  The  balloon was deflated and removed off the wire.  On completion imaging, there was a <30% residual stenosis in the right innominate vein.  Based on the completion imaging, no further intervention is necessary.  There was palpable thrill in the fistula at this point.  The wire and balloon were removed from the sheath.  A 4-0 Monocryl purse-string suture was sewn around the sheath.  The sheath was removed while tying down the suture.  A sterile bandage was applied to the puncture site.   COMPLICATIONS: none  CONDITION: stable   Adele Barthel, MD, [facility] [place] Office: [phone] Pager: [phone]  10/17/2016 1:21 PM

## 2016-10-18 ENCOUNTER — Telehealth: Payer: Self-pay | Admitting: Vascular Surgery

## 2016-10-18 ENCOUNTER — Emergency Department (HOSPITAL_COMMUNITY)
Admission: EM | Admit: 2016-10-18 | Discharge: 2016-10-18 | Disposition: A | Discharge status: Home / Self Care | Payer: Medicare Other | Attending: Emergency Medicine | Admitting: Emergency Medicine

## 2016-10-18 ENCOUNTER — Telehealth: Payer: Self-pay

## 2016-10-18 DIAGNOSIS — Z95810 Presence of automatic (implantable) cardiac defibrillator: Secondary | ICD-10-CM | POA: Diagnosis not present

## 2016-10-18 DIAGNOSIS — I252 Old myocardial infarction: Secondary | ICD-10-CM | POA: Insufficient documentation

## 2016-10-18 DIAGNOSIS — Z87891 Personal history of nicotine dependence: Secondary | ICD-10-CM | POA: Diagnosis not present

## 2016-10-18 DIAGNOSIS — I5022 Chronic systolic (congestive) heart failure: Secondary | ICD-10-CM | POA: Diagnosis not present

## 2016-10-18 DIAGNOSIS — N289 Disorder of kidney and ureter, unspecified: Secondary | ICD-10-CM | POA: Diagnosis not present

## 2016-10-18 DIAGNOSIS — J45909 Unspecified asthma, uncomplicated: Secondary | ICD-10-CM | POA: Insufficient documentation

## 2016-10-18 DIAGNOSIS — M79621 Pain in right upper arm: Secondary | ICD-10-CM | POA: Diagnosis not present

## 2016-10-18 DIAGNOSIS — Z7982 Long term (current) use of aspirin: Secondary | ICD-10-CM | POA: Insufficient documentation

## 2016-10-18 DIAGNOSIS — Z951 Presence of aortocoronary bypass graft: Secondary | ICD-10-CM | POA: Insufficient documentation

## 2016-10-18 DIAGNOSIS — G8918 Other acute postprocedural pain: Secondary | ICD-10-CM | POA: Diagnosis not present

## 2016-10-18 DIAGNOSIS — I130 Hypertensive heart and chronic kidney disease with heart failure and stage 1 through stage 4 chronic kidney disease, or unspecified chronic kidney disease: Secondary | ICD-10-CM | POA: Diagnosis not present

## 2016-10-18 DIAGNOSIS — M79601 Pain in right arm: Secondary | ICD-10-CM | POA: Diagnosis present

## 2016-10-18 DIAGNOSIS — N184 Chronic kidney disease, stage 4 (severe): Secondary | ICD-10-CM | POA: Diagnosis not present

## 2016-10-18 LAB — COMPREHENSIVE METABOLIC PANEL
ALK PHOS: 36 U/L — AB (ref 38–126)
ALT: 18 U/L (ref 17–63)
AST: 23 U/L (ref 15–41)
Albumin: 4.1 g/dL (ref 3.5–5.0)
Anion gap: 10 (ref 5–15)
BUN: 67 mg/dL — ABNORMAL HIGH (ref 6–20)
CALCIUM: 8.8 mg/dL — AB (ref 8.9–10.3)
CO2: 28 mmol/L (ref 22–32)
CREATININE: 3.12 mg/dL — AB (ref 0.61–1.24)
Chloride: 97 mmol/L — ABNORMAL LOW (ref 101–111)
GFR, EST AFRICAN AMERICAN: 20 mL/min — AB (ref >60)
GFR, EST NON AFRICAN AMERICAN: 17 mL/min — AB (ref >60)
Glucose, Bld: 98 mg/dL (ref 65–99)
Potassium: 3.5 mmol/L (ref 3.5–5.1)
SODIUM: 135 mmol/L (ref 135–145)
Total Bilirubin: 1.1 mg/dL (ref 0.3–1.2)
Total Protein: 6.9 g/dL (ref 6.5–8.1)

## 2016-10-18 LAB — CBC WITH DIFFERENTIAL/PLATELET
BASOS ABS: 0 10*3/uL (ref 0.0–0.1)
BASOS PCT: 0 %
EOS ABS: 0.2 10*3/uL (ref 0.0–0.7)
Eosinophils Relative: 3 %
HCT: 43.9 % (ref 39.0–52.0)
Hemoglobin: 14.4 g/dL (ref 13.0–17.0)
Lymphocytes Relative: 24 %
Lymphs Abs: 1.5 10*3/uL (ref 0.7–4.0)
MCH: 31.2 pg (ref 26.0–34.0)
MCHC: 32.8 g/dL (ref 30.0–36.0)
MCV: 95 fL (ref 78.0–100.0)
MONOS PCT: 7 %
Monocytes Absolute: 0.5 10*3/uL (ref 0.1–1.0)
Neutro Abs: 4.1 10*3/uL (ref 1.7–7.7)
Neutrophils Relative %: 66 %
Platelets: 110 10*3/uL — ABNORMAL LOW (ref 150–400)
RBC: 4.62 MIL/uL (ref 4.22–5.81)
RDW: 12.9 % (ref 11.5–15.5)
WBC: 6.2 10*3/uL (ref 4.0–10.5)

## 2016-10-18 NOTE — ED Notes (Signed)
Pt stable, understands discharge instructions, and reasons for return.   

## 2016-10-18 NOTE — ED Provider Notes (Signed)
[facility] DEPT Provider Note   CSN: [id number] Arrival date & time: 10/18/16  1748 By signing my name below, I, Dyke Brackett, attest that this documentation has been prepared under the direction and in the presence of Delora Fuel, MD . Electronically Signed: Dyke Brackett, Scribe. 10/18/2016. 11:24 PM.   History   Chief Complaint Chief Complaint  Patient presents with  . Arm Pain  . Vascular Access Problem   HPI Shane [name] is a 83 y.o. male with a history of  CHF, CKD stage III, CAD, DM, HTN and MI who presents to the Emergency Department complaining of sudden onset, constant, mild right arm pain onset this afternoon. He rates his pain 3/10 with no alleviating or modifying factors noted. Pt had right peripheral vascular balloon angioplasty performed yesterday by Dr. Bridgett Larsson. Pt states he contacted Dr. Bridgett Larsson today and was referred to come to the ED for further evaluation. He notes associated swelling to the area and has no other complaints or symptoms at this time.  The history is provided by the patient. No language interpreter was used.   Past Medical History:  Diagnosis Date  . Abnormal nuclear cardiac imaging test Nov 2010   moderate area of infarct in the inferior wall with only minimal reversibility and EF of 28%  . AICD (automatic cardioverter/defibrillator) present   . Allergic rhinitis 01-08-13   Uses nebulizer for chronic sinus issues and Mucinex.  . Arthritis    osteoarthritis   . Asthma    Extrinic  . Blood transfusion    ? at time of bypass surgery   . BPH (benign prostatic hyperplasia)   . Cellulitis of arm, left    MSSA  . CHF (congestive heart failure) ([place])   . CKD (chronic kidney disease), stage III    "was told due to meds he takes"-no Renal workup done  . Colon polyps    adenomatous  . Coronary artery disease    remote CABG in 1985, cath in 2003 by Dr. Lia Foyer with no PCI, last nuclear in 2010 showing scar and EF of 28%.   . Diabetes mellitus   .  Dyslipidemia   . Glaucoma 01-08-13   tx. eye drops  . HOH (hard of hearing)   . HTN (hypertension)   . Hypercholesterolemia   . Left ventricular dysfunction    28% per nuclear in 2010 and 35 to 40% per echo in 2010  . Myocardial infarction    1985  . Neuromuscular disorder ([place])    legs mild paralysis-able to walk"nerve damage"-legs- left leg brace   . Neuropathy ([place])   . Pneumonia    hx of several times years ago   . Spinal stenosis     Patient Active Problem List   Diagnosis Date Noted  . Chronic systolic CHF (congestive heart failure) ([place]) 07/01/2016  . Restrictive lung disease 01/22/2016  . Severe obstructive sleep apnea 12/17/2015  . Essential hypertension 11/16/2015  . 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 LBBB (left bundle branch block) 10/10/2014  . Syncope 10/06/2014  . Pain in thumb joint with movement of right hand 01/04/2014  . Sinusitis, chronic 07/16/2013  . Asthma, moderate persistent 07/08/2013  . Chronic infection of sinus 07/31/2012  . Benign prostatic hypertrophy without urinary obstruction 12/18/2011  . CAD s/p coronary arthery bypass graft   . BPH (benign prostatic hyperplasia)   . Chronic kidney disease (CKD), stage IV (severe) ([place])   . H/O Spinal stenosis   . DM (diabetes mellitus), type 2 with  renal complications ([place]) 82/99/3716  . Benign hypertensive heart disease without heart failure 11/07/2010  . Allergic rhinitis 11/07/2010  . Osteoarthritis 11/07/2010  . Ischemic cardiomyopathy   . Hypercholesterolemia     Past Surgical History:  Procedure Laterality Date  . A/V SHUNTOGRAM Right 10/17/2016   Procedure: A/V Fistulagram;  Surgeon: Conrad [place], MD;  Location: [facility];  Service: Cardiovascular;  Laterality: Right;  . BACK SURGERY     hx of back surgery x 4   . BASCILIC VEIN TRANSPOSITION Right 09/09/2016   Procedure: BASCILIC VEIN TRANSPOSITION Right Arm;  Surgeon: Rosetta Posner, MD;  Location[facility];  Service: Vascular;  Laterality: Right;    . BI-VENTRICULAR IMPLANTABLE CARDIOVERTER DEFIBRILLATOR N/A 10/10/2014   Procedure: BI-VENTRICULAR IMPLANTABLE CARDIOVERTER DEFIBRILLATOR  (CRT-D);  Surgeon: [name], MD;  Location: [facility] CATH LAB;  Service: Cardiovascular;  Laterality: N/A;  . CARDIAC CATHETERIZATION  2003  . Cataract      recent cataract surgery 6'14  . COLONOSCOPY N/A 01/25/2013   Procedure: COLONOSCOPY;  Surgeon: Irene Shipper, MD;  Location: [facility] ENDOSCOPY;  Service: Endoscopy;  Laterality: N/A;  . CORONARY ARTERY BYPASS GRAFT  1985   x 5 vessels  . LUMBAR DISC SURGERY    . PERIPHERAL VASCULAR BALLOON ANGIOPLASTY Right 10/17/2016   Procedure: Peripheral Vascular Balloon Angioplasty;  Surgeon: Conrad Delmont, MD;  Location: [facility];  Service: Cardiovascular;  Laterality: Right;  ARM VEINOUS AND CENTRAL VEIN  . PR POLYSOM 6/>YRS SLEEP 4/> ADDL PARAM ATTND  12/07/2015  . PROSTATE SURGERY    . TOTAL KNEE ARTHROPLASTY  08/01/2011   Procedure: TOTAL KNEE ARTHROPLASTY;  Surgeon: Johnn Hai;  Location: [facility] ORS;  Service: Orthopedics;  Laterality: Right;    Home Medications    Prior to Admission medications   Medication Sig Start Date End Date Taking? Authorizing Provider  acetaminophen (TYLENOL) 500 MG tablet Take 1,000 mg by mouth every 8 (eight) hours as needed (for pain.).     Historical Provider, MD  amLODipine (NORVASC) 10 MG tablet Take 1 tablet (10 mg total) by mouth daily. 09/10/16   Thayer Headings, MD  aspirin EC 81 MG tablet Take 81 mg by mouth daily.     Historical Provider, MD  atorvastatin (LIPITOR) 10 MG tablet Take 1 tablet (10 mg total) by mouth daily. 05/02/16   Jolaine Artist, MD  b complex vitamins tablet Take 1 tablet by mouth 3 (three) times a week.     Historical Provider, MD  carvedilol (COREG) 25 MG tablet Take 1 tablet (25 mg total) by mouth 2 (two) times daily. 10/09/16   [name], MD  cetirizine (ZYRTEC) 10 MG tablet Take 10 mg by mouth at bedtime as needed for allergies.     Historical Provider, MD  Cholecalciferol (VITAMIN D-3) 1000 UNITS CAPS Take 2,000 Units by mouth 3 (three) times a week.     Historical Provider, MD  CIALIS 20 MG tablet Take 20 mg by mouth daily as needed for erectile dysfunction. DO NOT EXCEED 2 DOSES WITHIN 7 DAYS 09/20/11   Historical Provider, MD  diphenhydrAMINE (BENADRYL) 25 mg capsule Take 25 mg by mouth every 6 (six) hours as needed for allergies.     Historical Provider, MD  doxycycline (VIBRAMYCIN) 100 MG capsule  09/23/16   Historical Provider, MD  fluocinonide cream (LIDEX) 9.67 % Apply 1 application topically 2 (two) times daily as needed (for itchy rash).  03/16/15  Historical Provider, MD  furosemide (LASIX) 80 MG tablet Take 1 tablet (80 mg total) by mouth daily. 10/14/16   Jolaine Artist, MD  metolazone (ZAROXOLYN) 5 MG tablet Take 1 tablet (5 mg total) by mouth daily as needed. For weight greater than 214 lbs. 05/27/16 09/04/17  Jolaine Artist, MD  Multiple Vitamin (MULTIVITAMIN WITH MINERALS) TABS tablet Take 1 tablet by mouth 3 (three) times a week.    Historical Provider, MD  omalizumab Arvid Right) 150 MG injection Inject 300 mg into the skin every 14 (fourteen) days.    Historical Provider, MD  sacubitril-valsartan (ENTRESTO) 24-26 MG Take 1 tablet by mouth 2 (two) times daily. 10/14/16   Jolaine Artist, MD  testosterone cypionate (DEPOTESTOTERONE CYPIONATE) 100 MG/ML injection Inject 400 mg into the muscle every 21 ( twenty-one) days. For IM use only    Historical Provider, MD  traMADol (ULTRAM) 50 MG tablet Take 1 tablet (50 mg total) by mouth every 8 (eight) hours as needed. 09/09/16   Samantha J Rhyne, PA-C  triamcinolone (NASACORT AQ) 55 MCG/ACT AERO nasal inhaler Place 2 sprays into the nose daily.    Historical Provider, MD  zolpidem (AMBIEN) 10 MG tablet TAKE ONE-HALF TO ONE TABLET BY MOUTH AT BEDTIME AS NEEDED FOR SLEEP Patient taking differently: TAKE ONE-HALF TABLET (5 MG) BY MOUTH AT BEDTIME AS NEEDED FOR SLEEP.  06/17/16   [name], MD    Family History Family History  Problem Relation Age of Onset  . Heart attack Father   . Heart disease Father   . Colon polyps Father   . Lung cancer Mother   . Diabetes Sister     x 2  . Heart disease Brother     x 2  . Bone cancer Brother   . Lung disease Neg Hx     Social History Social History  Substance Use Topics  . Smoking status: Former Smoker    Packs/day: 1.00    Years: 5.00    Types: Cigarettes, Pipe, Cigars    Quit date: 08/05/1958  . Smokeless tobacco: Never Used  . Alcohol use No     Allergies   Codeine; Hydrocodone; and Azithromycin   Review of Systems Review of Systems  Constitutional: Negative for fever.  Musculoskeletal: Positive for myalgias.  All other systems reviewed and are negative.  Physical Exam Updated Vital Signs BP (!) 155/65   Pulse 64   Temp 98.6 F (37 C) (Oral)   Resp 18   Ht 5\' 10"  (1.778 m)   Wt 224 lb 7 oz (101.8 kg)   SpO2 96%   BMI 32.20 kg/m   Physical Exam  Constitutional: He is oriented to person, place, and time. He appears well-developed and well-nourished.  HENT:  Head: Normocephalic and atraumatic.  Eyes: EOM are normal. Pupils are equal, round, and reactive to light.  Neck: Normal range of motion. Neck supple. No JVD present.  Cardiovascular: Normal rate, regular rhythm and normal heart sounds.   No murmur heard. Pulmonary/Chest: Effort normal and breath sounds normal. He has no wheezes. He has no rales. He exhibits no tenderness.  Abdominal: Soft. Bowel sounds are normal. He exhibits no distension and no mass. There is no tenderness.  Musculoskeletal: Normal range of motion. He exhibits edema.  AV fistula in right upper arm with strong thrill present. Trace edema in the right forearm.   Lymphadenopathy:    He has no cervical adenopathy.  Neurological: He is alert and oriented to person,  place, and time. No cranial nerve deficit. He exhibits normal muscle tone. Coordination  normal.  Skin: Skin is warm and dry. No rash noted.  Psychiatric: He has a normal mood and affect. His behavior is normal. Judgment and thought content normal.  Nursing note and vitals reviewed.   ED Treatments / Results  DIAGNOSTIC STUDIES:  Oxygen Saturation is 96% on RA, normal by my interpretation.    COORDINATION OF CARE:  11:21 PM Discussed treatment plan with pt at bedside and pt agreed to plan.   Labs (all labs ordered are listed, but only abnormal results are displayed) Labs Reviewed  CBC WITH DIFFERENTIAL/PLATELET - Abnormal; Notable for the following:       Result Value   Platelets 110 (*)    All other components within normal limits  COMPREHENSIVE METABOLIC PANEL - Abnormal; Notable for the following:    Chloride 97 (*)    BUN 67 (*)    Creatinine, Ser 3.12 (*)    Calcium 8.8 (*)    Alkaline Phosphatase 36 (*)    GFR calc non Af Amer 17 (*)    GFR calc Af Amer 20 (*)    All other components within normal limits    Procedures Procedures (including critical care time)  Medications Ordered in ED Medications - No data to display   Initial Impression / Assessment and Plan / ED Course  I have reviewed the triage vital signs and the nursing notes.  Pertinent lab results that were available during my care of the patient were reviewed by me and considered in my medical decision making (see chart for details).  Patient with AV fistula of status post angioplasty yesterday. Review of old records confirms angioplasty in 2 locations where there was stenosis in the basilic vein. Exam today shows good flow through the fistula with thrill present. There is an area of swelling which also has a thrill and this is felt to be likely an area of venous expansion from the increased flow. No evidence of any vascular leaking or occlusion.  Final Clinical Impressions(s) / ED Diagnoses   Final diagnoses:  Post-op pain  Renal insufficiency    New Prescriptions New Prescriptions     No medications on file   I personally performed the services described in this documentation, which was scribed in my presence. The recorded information has been reviewed and is accurate.       Delora Fuel, MD [id number]

## 2016-10-18 NOTE — Telephone Encounter (Signed)
-----   Message from Mena Goes, RN sent at 10/18/2016  9:46 AM EDT ----- Regarding: RE: 4-6 weeks Yes please make it 4-6 from yesterday ----- Message ----- From: Donzetta Matters Sent: 10/18/2016   8:20 AM To: Mena Goes, RN Subject: RE: 4-6 weeks                                  Pt  Has an established appt 3/20 for a post op from a procedure done in feb. Should we keep this or push it back?  ----- Message ----- From: Mena Goes, RN Sent: 10/17/2016   2:19 PM To: [facility] Admin Pool Subject: 4-6 weeks                                        ----- Message ----- From: Conrad [place], MD Sent: 10/17/2016   1:34 PM To: [address]  SUNIL HUE [id number] July 30, 1934  PROCEDURE: 1.  right basilic vein transposition cannulation under ultrasound guidance 2.  right arm shuntogram 3.  Venoplasty of basilic vein x 2 (6 mm x 60 mm, 8 mm x 60 mm) 4.  Venoplasty of right innominate vein (8 mm x 60 mm)  Follow-up: 4-6 weeks with Dr. Donnetta Hutching

## 2016-10-18 NOTE — Telephone Encounter (Signed)
Phone call from pt.  Reported the right arm has a "pea sized knot over a vein in the upper arm, and an egg sized knot under the right arm."  Stated there is increased soreness, compared to how he felt after the procedure.  Denied any redness, warmth or drainage.  Denied any other symptoms;  denied having problems in  right forearm or hand.  Discussed with Dr. Bridgett Larsson.  Advised to send to ER for evaluation.  Notified pt. Of recommendations; verb. understanding.

## 2016-10-18 NOTE — Telephone Encounter (Signed)
Rescheduled 3/20 appt to 4/10 @ 8:45am. Pt is aware.

## 2016-10-18 NOTE — Discharge Instructions (Signed)
Continue your post-operative care. Return to the ED if you have any concerns.

## 2016-10-18 NOTE — ED Triage Notes (Signed)
Pt reports R arm pain post procedure completed on fistula yesterday here by Dr. Bridgett Larsson, pt states, "they did the procedure yesterday because there was restricted flow. They done the procedure with a balloon." pt has swollen area above the fistula 4cm x 3cm in size the pt notice today, pt reports the area tender to touch, skin warm, pts R arm swollen and slightly red, A&O x4

## 2016-10-18 NOTE — ED Notes (Signed)
Pt approached this desk asking for wait times; RN informed that patient is on list to be seen by provider and apologized for the long wait; RN asked if there was any change in symptoms, pain, etc.- pt replied "no, I wish I would have known the wait was going to be this long"; pt returned to his seat in the waiting room

## 2016-10-22 ENCOUNTER — Encounter: Payer: Medicare Other | Admitting: Vascular Surgery

## 2016-10-30 ENCOUNTER — Encounter: Payer: Self-pay | Admitting: Vascular Surgery

## 2016-10-30 ENCOUNTER — Ambulatory Visit: Payer: Medicare Other

## 2016-10-30 ENCOUNTER — Encounter: Payer: Self-pay | Admitting: Internal Medicine

## 2016-10-31 ENCOUNTER — Ambulatory Visit (INDEPENDENT_AMBULATORY_CARE_PROVIDER_SITE_OTHER): Payer: Medicare Other

## 2016-10-31 ENCOUNTER — Ambulatory Visit: Payer: Medicare Other | Admitting: Podiatry

## 2016-10-31 DIAGNOSIS — J4540 Moderate persistent asthma, uncomplicated: Secondary | ICD-10-CM

## 2016-11-04 ENCOUNTER — Ambulatory Visit (HOSPITAL_COMMUNITY)
Admission: RE | Admit: 2016-11-04 | Discharge: 2016-11-04 | Disposition: A | Discharge status: Home / Self Care | Payer: Medicare Other | Source: Ambulatory Visit | Attending: Internal Medicine | Admitting: Internal Medicine

## 2016-11-04 ENCOUNTER — Encounter (HOSPITAL_COMMUNITY): Payer: Self-pay | Admitting: Internal Medicine

## 2016-11-04 ENCOUNTER — Other Ambulatory Visit: Payer: Self-pay | Admitting: Internal Medicine

## 2016-11-04 VITALS — BP 142/68 | HR 77 | Wt 224.0 lb

## 2016-11-04 DIAGNOSIS — Z95810 Presence of automatic (implantable) cardiac defibrillator: Secondary | ICD-10-CM | POA: Insufficient documentation

## 2016-11-04 DIAGNOSIS — I255 Ischemic cardiomyopathy: Secondary | ICD-10-CM | POA: Diagnosis not present

## 2016-11-04 DIAGNOSIS — Z87891 Personal history of nicotine dependence: Secondary | ICD-10-CM | POA: Diagnosis not present

## 2016-11-04 DIAGNOSIS — I130 Hypertensive heart and chronic kidney disease with heart failure and stage 1 through stage 4 chronic kidney disease, or unspecified chronic kidney disease: Secondary | ICD-10-CM | POA: Diagnosis not present

## 2016-11-04 DIAGNOSIS — I2510 Atherosclerotic heart disease of native coronary artery without angina pectoris: Secondary | ICD-10-CM | POA: Insufficient documentation

## 2016-11-04 DIAGNOSIS — N185 Chronic kidney disease, stage 5: Secondary | ICD-10-CM

## 2016-11-04 DIAGNOSIS — E1122 Type 2 diabetes mellitus with diabetic chronic kidney disease: Secondary | ICD-10-CM | POA: Diagnosis not present

## 2016-11-04 DIAGNOSIS — I5022 Chronic systolic (congestive) heart failure: Secondary | ICD-10-CM | POA: Insufficient documentation

## 2016-11-04 DIAGNOSIS — Z951 Presence of aortocoronary bypass graft: Secondary | ICD-10-CM | POA: Insufficient documentation

## 2016-11-04 DIAGNOSIS — Z79899 Other long term (current) drug therapy: Secondary | ICD-10-CM | POA: Insufficient documentation

## 2016-11-04 DIAGNOSIS — Z7982 Long term (current) use of aspirin: Secondary | ICD-10-CM | POA: Insufficient documentation

## 2016-11-04 DIAGNOSIS — N184 Chronic kidney disease, stage 4 (severe): Secondary | ICD-10-CM | POA: Insufficient documentation

## 2016-11-04 DIAGNOSIS — G4700 Insomnia, unspecified: Secondary | ICD-10-CM

## 2016-11-04 LAB — BASIC METABOLIC PANEL
Anion gap: 10 (ref 5–15)
BUN: 79 mg/dL — AB (ref 6–20)
CALCIUM: 8.8 mg/dL — AB (ref 8.9–10.3)
CHLORIDE: 98 mmol/L — AB (ref 101–111)
CO2: 29 mmol/L (ref 22–32)
CREATININE: 3.27 mg/dL — AB (ref 0.61–1.24)
GFR calc Af Amer: 19 mL/min — ABNORMAL LOW (ref >60)
GFR calc non Af Amer: 16 mL/min — ABNORMAL LOW (ref >60)
Glucose, Bld: 181 mg/dL — ABNORMAL HIGH (ref 65–99)
Potassium: 3.9 mmol/L (ref 3.5–5.1)
SODIUM: 137 mmol/L (ref 135–145)

## 2016-11-04 MED ORDER — OMALIZUMAB 150 MG ~~LOC~~ SOLR
300.0000 mg | SUBCUTANEOUS | Status: DC
  Administered 2016-10-31: 300 mg via SUBCUTANEOUS

## 2016-11-04 MED ORDER — TORSEMIDE 20 MG PO TABS: 40.0000 mg | ORAL_TABLET | Freq: Every day | ORAL | 3 refills | Status: DC

## 2016-11-04 NOTE — Telephone Encounter (Signed)
RX faxed to [facility] 

## 2016-11-04 NOTE — Patient Instructions (Signed)
Stop Furosemide  Start Torsemide 40 mg (2 tabs) daily  Your physician recommends that you schedule a follow-up appointment in: 2 months

## 2016-11-04 NOTE — Progress Notes (Signed)
Patient ID: Shane [name], male   DOB: [birth year], 83 y.o.   MRN: [id number]   [facility]ED HF CLINIC NOTE   Date:  11/04/2016   ID:  Shane [name], DOB 1933/09/28, MRN [id number]  PCP:  [name], MD  Cardiologist:   Nahser  Nephrologist: Hollie Salk  History of Present Illness: Shane [name] is a 83 y.o. male with CAD s/p CABG, systolic HF EF 08-14% and CKD 4 referred to the HF Clinic by Dr. Acie Fredrickson.   He is retired from [employer]. Says he was doing fine until March or April when he started having more DOE. Felt he couldn't breath as deeply. MDT ICD was optimized. Took him off Toprol and switched to carvedilol. Also started Entresto. Began to gain fluid. Then lasix was started. (this was new). Entresto stopped. Creatine went from 1.9 to 3.3 then back to to 2.7  Here for f/u. On 09/09/16 had AV fistula placed for HD. Needed angioplasty of graft about 2 weeks ago. Says he is doing OK.  Continues to struggle with fluid overload. Feels he doesn't respond well to lasix 80 daily.  Takes metolazone about 1x/week. Creatinine runs 2.4 to 3.1. Saw Dr. Caryl Comes 2 weeks ago who wanted to stop amlodipine and considered increasing Entresto versus adding hydralazine/nitrates. Weight now 217 at home. Has been taking metolazone any time over 214. Mild DOE. Sleeps with head of bed raised. Took a break from riding bike last month with AV fistula. Now back to riding 15-30 mins on motorized bike that he has to keep up with.     Past Medical History:  Diagnosis Date  . Abnormal nuclear cardiac imaging test Nov 2010   moderate area of infarct in the inferior wall with only minimal reversibility and EF of 28%  . AICD (automatic cardioverter/defibrillator) present   . Allergic rhinitis 01-08-13   Uses nebulizer for chronic sinus issues and Mucinex.  . Arthritis    osteoarthritis   . Asthma    Extrinic  . Blood transfusion    ? at time of bypass surgery   . BPH (benign prostatic hyperplasia)   .  Cellulitis of arm, left    MSSA  . CHF (congestive heart failure) ([place])   . CKD (chronic kidney disease), stage III    "was told due to meds he takes"-no Renal workup done  . Colon polyps    adenomatous  . Coronary artery disease    remote CABG in 1985, cath in 2003 by Dr. Lia Foyer with no PCI, last nuclear in 2010 showing scar and EF of 28%.   . Diabetes mellitus   . Dyslipidemia   . Glaucoma 01-08-13   tx. eye drops  . HOH (hard of hearing)   . HTN (hypertension)   . Hypercholesterolemia   . Left ventricular dysfunction    28% per nuclear in 2010 and 35 to 40% per echo in 2010  . Myocardial infarction    1985  . Neuromuscular disorder ([place])    legs mild paralysis-able to walk"nerve damage"-legs- left leg brace   . Neuropathy ([place])   . Pneumonia    hx of several times years ago   . Spinal stenosis     Past Surgical History:  Procedure Laterality Date  . A/V SHUNTOGRAM Right 10/17/2016   Procedure: A/V Fistulagram;  Surgeon: Conrad Brewster, MD;  Location: [facility];  Service: Cardiovascular;  Laterality: Right;  . BACK SURGERY     hx of back surgery  x 4   . BASCILIC VEIN TRANSPOSITION Right 09/09/2016   Procedure: BASCILIC VEIN TRANSPOSITION Right Arm;  Surgeon: Rosetta Posner, MD;  Location: [facility] OR;  Service: Vascular;  Laterality: Right;  . BI-VENTRICULAR IMPLANTABLE CARDIOVERTER DEFIBRILLATOR N/A 10/10/2014   Procedure: BI-VENTRICULAR IMPLANTABLE CARDIOVERTER DEFIBRILLATOR  (CRT-D);  Surgeon: [name], MD;  Location: [facility] CATH LAB;  Service: Cardiovascular;  Laterality: N/A;  . CARDIAC CATHETERIZATION  2003  . Cataract      recent cataract surgery 6'14  . COLONOSCOPY N/A 01/25/2013   Procedure: COLONOSCOPY;  Surgeon: Irene Shipper, MD;  Location: [facility];  Service: Endoscopy;  Laterality: N/A;  . CORONARY ARTERY BYPASS GRAFT  1985   x 5 vessels  . LUMBAR DISC SURGERY    . PERIPHERAL VASCULAR BALLOON ANGIOPLASTY Right 10/17/2016   Procedure: Peripheral Vascular  Balloon Angioplasty;  Surgeon: Conrad Topanga, MD;  Location: [facility];  Service: Cardiovascular;  Laterality: Right;  ARM VEINOUS AND CENTRAL VEIN  . PR POLYSOM 6/>YRS SLEEP 4/> ADDL PARAM ATTND  12/07/2015  . PROSTATE SURGERY    . TOTAL KNEE ARTHROPLASTY  08/01/2011   Procedure: TOTAL KNEE ARTHROPLASTY;  Surgeon: Johnn Hai;  Location: [facility] ORS;  Service: Orthopedics;  Laterality: Right;     Current Outpatient Prescriptions  Medication Sig Dispense Refill  . acetaminophen (TYLENOL) 500 MG tablet Take 1,000 mg by mouth every 8 (eight) hours as needed (for pain.).     Marland Kitchen amLODipine (NORVASC) 10 MG tablet Take 1 tablet (10 mg total) by mouth daily. 90 tablet 2  . aspirin EC 81 MG tablet Take 81 mg by mouth daily.     Marland Kitchen atorvastatin (LIPITOR) 10 MG tablet Take 1 tablet (10 mg total) by mouth daily. 90 tablet 3  . b complex vitamins tablet Take 1 tablet by mouth 3 (three) times a week.     . carvedilol (COREG) 25 MG tablet Take 1 tablet (25 mg total) by mouth 2 (two) times daily. 180 tablet 0  . cetirizine (ZYRTEC) 10 MG tablet Take 10 mg by mouth at bedtime as needed for allergies.    . Cholecalciferol (VITAMIN D-3) 1000 UNITS CAPS Take 2,000 Units by mouth 3 (three) times a week.     . CIALIS 20 MG tablet Take 20 mg by mouth daily as needed for erectile dysfunction. DO NOT EXCEED 2 DOSES WITHIN 7 DAYS    . diphenhydrAMINE (BENADRYL) 25 mg capsule Take 25 mg by mouth every 6 (six) hours as needed for allergies.     . fluocinonide cream (LIDEX) 2.45 % Apply 1 application topically 2 (two) times daily as needed (for itchy rash).     . furosemide (LASIX) 80 MG tablet Take 1 tablet (80 mg total) by mouth daily. 90 tablet 3  . metolazone (ZAROXOLYN) 5 MG tablet Take 1 tablet (5 mg total) by mouth daily as needed. For weight greater than 214 lbs. 90 tablet 3  . Multiple Vitamin (MULTIVITAMIN WITH MINERALS) TABS tablet Take 1 tablet by mouth 3 (three) times a week.    Marland Kitchen omalizumab (XOLAIR) 150  MG injection Inject 300 mg into the skin every 14 (fourteen) days.    . sacubitril-valsartan (ENTRESTO) 24-26 MG Take 1 tablet by mouth 2 (two) times daily. 180 tablet 3  . testosterone cypionate (DEPOTESTOTERONE CYPIONATE) 100 MG/ML injection Inject 400 mg into the muscle every 21 ( twenty-one) days. For IM use only    . traMADol (ULTRAM) 50 MG tablet Take  1 tablet (50 mg total) by mouth every 8 (eight) hours as needed. 10 tablet 0  . triamcinolone (NASACORT AQ) 55 MCG/ACT AERO nasal inhaler Place 2 sprays into the nose daily.    Marland Kitchen zolpidem (AMBIEN) 10 MG tablet Take one-half to one tablet by mouth at bedtime as needed for sleep. -- Office visit needed for further refills. 30 tablet 0   Current Facility-Administered Medications  Medication Dose Route Frequency Provider Last Rate Last Dose  . omalizumab Arvid Right) injection 300 mg  300 mg Subcutaneous Q14 Days Javier Glazier, MD   300 mg at 09/03/16 1053  . omalizumab Arvid Right) injection 300 mg  300 mg Subcutaneous Q14 Days Javier Glazier, MD   300 mg at 09/17/16 0843  . omalizumab Arvid Right) injection 300 mg  300 mg Subcutaneous Q14 Days Javier Glazier, MD   300 mg at 10/02/16 0757  . omalizumab Arvid Right) injection 300 mg  300 mg Subcutaneous Q14 Days Javier Glazier, MD   300 mg at 10/16/16 1647  . omalizumab Arvid Right) injection 300 mg  300 mg Subcutaneous Q14 Days Javier Glazier, MD   300 mg at 10/31/16 0920    Allergies:   Codeine; Hydrocodone; and Azithromycin    Social History:  The patient  reports that he quit smoking about 58 years ago. His smoking use included Cigarettes, Pipe, and Cigars. He has a 5.00 pack-year smoking history. He has never used smokeless tobacco. He reports that he does not drink alcohol or use drugs.   Family History:  The patient's family history includes Bone cancer in his brother; Colon polyps in his father; Diabetes in his sister; Heart attack in his father; Heart disease in his brother and father; Lung  cancer in his mother.    ROS:  Please see the history of present illness.   PHYSICAL EXAM: VS:  BP (!) 142/68   Pulse 77   Wt 224 lb (101.6 kg)   SpO2 95%   BMI 32.14 kg/m  , BMI Body mass index is 32.14 kg/m.  GEN: Elderly obese male, in no acute distress  HEENT: normal  Neck: JVP 5  Possible soft right carotid bruit, or masses Cardiac: Mildyl Irregular ; no murmurs, rubs, or gallops, 1+ edema  Respiratory:  clear to auscultation bilaterally, normal work of breathing GI: soft, nontender, nondistended, + BS MS: no deformity or atrophy  Skin: warm and dry, no rash Neuro:  Strength and sensation are intact Psych: normal   Recent Labs: 03/07/2016: B Natriuretic Peptide 127.6 10/18/2016: ALT 18; BUN 67; Creatinine, Ser 3.12; Hemoglobin 14.4; Platelets 110; Potassium 3.5; Sodium 135    Lipid Panel    Component Value Date/Time   CHOL 104 (L) 09/18/2015 0955   TRIG 97 09/18/2015 0955   HDL 25 (L) 09/18/2015 0955   CHOLHDL 4.2 09/18/2015 0955   VLDL 19 09/18/2015 0955   LDLCALC 60 09/18/2015 0955      Wt Readings from Last 3 Encounters:  11/04/16 224 lb (101.6 kg)  10/18/16 224 lb 7 oz (101.8 kg)  10/17/16 223 lb (101.2 kg)     ICD interrogated personally: No VT/AF  Corevue - volume up moderately. Baseline activity remains 4hrs/day. 100% biv pacing  ASSESSMENT AND PLAN:  1. Chronic systolic congestive heart failure-due to ischemic cardiomyopathy. EF 40-45% echo 4/17. S/p ICD --Chronic NYHA II-early III --Volume status mildly elevated on exam and by Optivol (ICD interrogated personally). Feels like lasix is no longer working for him. Taking metolazone weekly  to manage volume --Will switch lasix 80 daily to torsemide 40 daily --BMET check today and creatinine climbing. Will thus stop Entresto. Will likely need hydralazine/nitrates --Continue b-blocker 2. Coronary artery disease - history of remote coronary artery bypass grafting 1985 --no angina --last cath 2003 LM and  RCA occluded. LIMA to LAD ok. RIMA to OM ok  SVG to PDA occluded. EF 45% 3. CKD 4, --followed by Dr. Hollie Salk in Nephrology. Creatinine up again today. Stop entresto. AVF in place 4. Hyperlipidemia --per PCP 5. Diabetes mellitus  6. Mild carotid artery disease.   - He has a very soft bruit. He had a duplex scan in 2007 which revealed mild carotid disease. He has a repeat pending later this month  7. HTN --BP slightly up. Stopping Entresto. Will likely need hydralazine/nitrates. Given tenuous renal function would not push BP too hard.   Glori Bickers, MD

## 2016-11-04 NOTE — Progress Notes (Signed)
Xolair injection documentation and charges entered by Pryce Folts, RMA, based on injection sheet filled out by Tammy Scott per office protocol.   

## 2016-11-06 ENCOUNTER — Telehealth: Payer: Self-pay | Admitting: Pulmonary Disease

## 2016-11-06 ENCOUNTER — Telehealth (HOSPITAL_COMMUNITY): Payer: Self-pay | Admitting: *Deleted

## 2016-11-06 DIAGNOSIS — I5022 Chronic systolic (congestive) heart failure: Secondary | ICD-10-CM | POA: Diagnosis not present

## 2016-11-06 DIAGNOSIS — I129 Hypertensive chronic kidney disease with stage 1 through stage 4 chronic kidney disease, or unspecified chronic kidney disease: Secondary | ICD-10-CM | POA: Diagnosis not present

## 2016-11-06 DIAGNOSIS — E669 Obesity, unspecified: Secondary | ICD-10-CM | POA: Diagnosis not present

## 2016-11-06 DIAGNOSIS — I770 Arteriovenous fistula, acquired: Secondary | ICD-10-CM | POA: Diagnosis not present

## 2016-11-06 DIAGNOSIS — N184 Chronic kidney disease, stage 4 (severe): Secondary | ICD-10-CM | POA: Diagnosis not present

## 2016-11-06 NOTE — Telephone Encounter (Signed)
Attempted to call pt and Left message to call back   

## 2016-11-06 NOTE — Telephone Encounter (Signed)
Patient called back and he is agreeable with stopping Entresto.  Medication list updated

## 2016-11-06 NOTE — Telephone Encounter (Signed)
-----   Message from Jolaine Artist, MD sent at 11/04/2016 11:38 PM EDT ----- Nira Conn - please ask him to stop Entresto. Thanks -dan

## 2016-11-06 NOTE — Telephone Encounter (Signed)
#   vials:4 Ordered date:11/06/16 Shipping Date:11/06/16

## 2016-11-07 DIAGNOSIS — E291 Testicular hypofunction: Secondary | ICD-10-CM | POA: Diagnosis not present

## 2016-11-07 NOTE — Telephone Encounter (Signed)
#   Vials:4 Arrival Date:11/07/16 Lot #:[id number] Exp Date:9/21

## 2016-11-12 ENCOUNTER — Ambulatory Visit (INDEPENDENT_AMBULATORY_CARE_PROVIDER_SITE_OTHER): Payer: Self-pay | Admitting: Vascular Surgery

## 2016-11-12 ENCOUNTER — Encounter: Payer: Self-pay | Admitting: Vascular Surgery

## 2016-11-12 VITALS — BP 139/73 | HR 73 | Temp 97.0°F | Resp 20 | Ht 71.0 in | Wt 225.0 lb

## 2016-11-12 DIAGNOSIS — N184 Chronic kidney disease, stage 4 (severe): Secondary | ICD-10-CM

## 2016-11-12 NOTE — Progress Notes (Signed)
Patient name: Shane [name] MRN: [id number] DOB: May 28, 1934 Sex: male  REASON FOR VISIT: Follow-up right basilic vein transposition fistula  HPI: Shane [name] is a 83 y.o. male for follow-up. He underwent basilic vein transposition fistula by myself on 09/09/2016. This was also single-stage. When I saw him back at one month follow-up he had very pulsatile nature of his fistula but very nice size maturation. Felt that he in all likelihood had proximal stenosis. He had an outpatient procedure by Dr. Geryl Councilman on the 15 2018. At that time he had a angioplasty of the vein and the innominate vein outflow.  Current Outpatient Prescriptions  Medication Sig Dispense Refill  . acetaminophen (TYLENOL) 500 MG tablet Take 1,000 mg by mouth every 8 (eight) hours as needed (for pain.).     Marland Kitchen amLODipine (NORVASC) 10 MG tablet Take 1 tablet (10 mg total) by mouth daily. 90 tablet 2  . aspirin EC 81 MG tablet Take 81 mg by mouth daily.     Marland Kitchen atorvastatin (LIPITOR) 10 MG tablet Take 1 tablet (10 mg total) by mouth daily. 90 tablet 3  . b complex vitamins tablet Take 1 tablet by mouth 3 (three) times a week.     . carvedilol (COREG) 25 MG tablet Take 1 tablet (25 mg total) by mouth 2 (two) times daily. 180 tablet 0  . cetirizine (ZYRTEC) 10 MG tablet Take 10 mg by mouth at bedtime as needed for allergies.    . Cholecalciferol (VITAMIN D-3) 1000 UNITS CAPS Take 2,000 Units by mouth 3 (three) times a week.     . CIALIS 20 MG tablet Take 20 mg by mouth daily as needed for erectile dysfunction. DO NOT EXCEED 2 DOSES WITHIN 7 DAYS    . diphenhydrAMINE (BENADRYL) 25 mg capsule Take 25 mg by mouth every 6 (six) hours as needed for allergies.     . fluocinonide cream (LIDEX) 6.29 % Apply 1 application topically 2 (two) times daily as needed (for itchy rash).     . metolazone (ZAROXOLYN) 5 MG tablet Take 1 tablet (5 mg total) by mouth daily as needed. For weight greater than 214 lbs. 90  tablet 3  . Multiple Vitamin (MULTIVITAMIN WITH MINERALS) TABS tablet Take 1 tablet by mouth 3 (three) times a week.    Marland Kitchen omalizumab (XOLAIR) 150 MG injection Inject 300 mg into the skin every 14 (fourteen) days.    Marland Kitchen testosterone cypionate (DEPOTESTOTERONE CYPIONATE) 100 MG/ML injection Inject 400 mg into the muscle every 21 ( twenty-one) days. For IM use only    . torsemide (DEMADEX) 20 MG tablet Take 2 tablets (40 mg total) by mouth daily. 60 tablet 3  . traMADol (ULTRAM) 50 MG tablet Take 1 tablet (50 mg total) by mouth every 8 (eight) hours as needed. 10 tablet 0  . triamcinolone (NASACORT AQ) 55 MCG/ACT AERO nasal inhaler Place 2 sprays into the nose daily.    Marland Kitchen zolpidem (AMBIEN) 10 MG tablet Take one-half to one tablet by mouth at bedtime as needed for sleep. -- Office visit needed for further refills. 30 tablet 0   Current Facility-Administered Medications  Medication Dose Route Frequency Provider Last Rate Last Dose  . omalizumab Arvid Right) injection 300 mg  300 mg Subcutaneous Q14 Days Javier Glazier, MD   300 mg at 09/03/16 1053  . omalizumab Arvid Right) injection 300 mg  300 mg Subcutaneous Q14 Days Javier Glazier, MD   300 mg at 09/17/16 0843  . omalizumab Arvid Right)  injection 300 mg  300 mg Subcutaneous Q14 Days Javier Glazier, MD   300 mg at 10/02/16 0757  . omalizumab Arvid Right) injection 300 mg  300 mg Subcutaneous Q14 Days Javier Glazier, MD   300 mg at 10/16/16 1647  . omalizumab Arvid Right) injection 300 mg  300 mg Subcutaneous Q14 Days Javier Glazier, MD   300 mg at 10/31/16 0920     PHYSICAL EXAM: Vitals:   11/12/16 0845  BP: 139/73  Pulse: 73  Resp: 20  Temp: 97 F (36.1 C)  TempSrc: Oral  SpO2: 96%  Weight: 225 lb (102.1 kg)  Height: 5\' 11"  (1.803 m)    GENERAL: The patient is a well-nourished male, in no acute distress. The vital signs are documented above. He has excellent maturation of size with a very straight course of his fistula. It is very  superficial and has an excellent thrill  MEDICAL ISSUES: Excellent maturation and flow in his right basilic vein transposition fistula. I feel he has a very high likelihood that this will be successful for long-term dialysis if he requires it. He will continue his follow-up with nephrology and see [place] again on an as-needed basis   Rosetta Posner, MD [facility] Tel [phone] Pager [phone]

## 2016-11-14 ENCOUNTER — Ambulatory Visit (INDEPENDENT_AMBULATORY_CARE_PROVIDER_SITE_OTHER): Payer: Medicare Other

## 2016-11-14 DIAGNOSIS — J4540 Moderate persistent asthma, uncomplicated: Secondary | ICD-10-CM | POA: Diagnosis not present

## 2016-11-15 ENCOUNTER — Encounter: Payer: Self-pay | Admitting: Podiatry

## 2016-11-15 ENCOUNTER — Ambulatory Visit (INDEPENDENT_AMBULATORY_CARE_PROVIDER_SITE_OTHER): Payer: Medicare Other | Admitting: Podiatry

## 2016-11-15 VITALS — BP 113/63 | HR 79

## 2016-11-15 DIAGNOSIS — M79675 Pain in left toe(s): Secondary | ICD-10-CM | POA: Diagnosis not present

## 2016-11-15 DIAGNOSIS — B351 Tinea unguium: Secondary | ICD-10-CM | POA: Diagnosis not present

## 2016-11-15 MED ORDER — OMALIZUMAB 150 MG ~~LOC~~ SOLR
300.0000 mg | Freq: Once | SUBCUTANEOUS | Status: AC
  Administered 2016-11-14: 300 mg via SUBCUTANEOUS

## 2016-11-15 NOTE — Progress Notes (Signed)
   Subjective:    Patient ID: [name], male    DOB: 01/07/1934, 83 y.o.   MRN: [id number]  HPI    Review of Systems     Objective:   Physical Exam        Assessment & Plan:

## 2016-11-15 NOTE — Progress Notes (Signed)
   Subjective:    Patient ID: Shane [name], male    DOB: 05-11-1934, 83 y.o.   MRN: [id number]  HPI this patient presents the office with chief complaint of a painful great toenail on his left foot. Patient states he is unable to self treat due to the thickness of the nail. He says the nails painful walking and wearing his shoes. Patient says he is diabetic. He also wears an [place] brace on his left ankle, which was prescribed by his orthopedic doctor. He says this was due to trauma to the left ankle. He presents the office today for evaluation and treatment of the painful toenail, left foot    Review of Systems  All other systems reviewed and are negative.      Objective:   Physical Exam GENERAL APPEARANCE: Alert, conversant. Appropriately groomed. No acute distress.  VASCULAR: Pedal pulses are  palpable at  [facility] and PT bilateral.  Capillary refill time is immediate to all digits,  Normal temperature gradient.   NEUROLOGIC: sensation is diminished  to 5.07 monofilament at 5/5 sites bilateral.  Light touch is intact bilateral, Muscle strength normal.  MUSCULOSKELETAL: acceptable muscle strength, tone and stability bilateral.  Intrinsic muscluature intact bilateral.  Rectus appearance of foot and digits noted bilateral.   DERMATOLOGIC: skin color, texture, and turgor are within normal limits.  No preulcerative lesions or ulcers  are seen, no interdigital maceration noted.  No open lesions present.   No drainage noted.  NAILS  thick disfigured discolored hallux toenail, left foot. No evidence of redness, swelling or drainage        Assessment & Plan:  Onychomycosis Left hallux.   DM.  IE  Debridement and grinding of nail.  RTC 3 months.   Gardiner Barefoot DPM

## 2016-11-19 ENCOUNTER — Ambulatory Visit (INDEPENDENT_AMBULATORY_CARE_PROVIDER_SITE_OTHER): Payer: Medicare Other

## 2016-11-19 ENCOUNTER — Telehealth: Payer: Self-pay | Admitting: Cardiology

## 2016-11-19 DIAGNOSIS — Z95810 Presence of automatic (implantable) cardiac defibrillator: Secondary | ICD-10-CM | POA: Diagnosis not present

## 2016-11-19 DIAGNOSIS — I5022 Chronic systolic (congestive) heart failure: Secondary | ICD-10-CM | POA: Diagnosis not present

## 2016-11-19 NOTE — Telephone Encounter (Signed)
Spoke with pt and reminded pt of remote transmission that is due today. Pt verbalized understanding.   

## 2016-11-21 ENCOUNTER — Telehealth: Payer: Self-pay

## 2016-11-21 NOTE — Telephone Encounter (Signed)
Remote ICM transmission received.  Attempted patient call and no answer 

## 2016-11-21 NOTE — Progress Notes (Signed)
EPIC Encounter for ICM Monitoring  Patient Name: Shane [name] is a 83 y.o. male Date: 11/21/2016 Primary Care Physican: [name], MD Primary Cardiologist:Nahser/Bensimhon Electrophysiologist: Caryl Comes Nephrologist: Hollie Salk at [facility] Weight: unknown Bi-V Pacing: 94.6%      Attempted call to patient and unable to reach.   Transmission reviewed.    Thoracic impedance normal.  Prescribed dosage: Torsemide 40 mg 1 tablet daily (changed from Furosemide on 4/2 by Dr Haroldine Laws). Metolazone 5 mg 1 tablet for weight gain > 214 lbs  Labs: 11/05/2016 Creatinine 3.27, BUN 79, Potassium 3.9, Sodium 137, EGFR 16-19 10/18/2016 Creatinine 2.7,   BUN 63. Potassium 3.6, Sodium 138 10/02/2016 Creatinine 2.43, BUN 66, Potassium 3.6, Sodium 139, EGFR 24-28 11/27/2017Creatinine 3.09, BUN 74, Potassium 4.3, Sodium 136 06/07/2016 Creatinine 2.59, BUN 65, Potassium 3.6, Sodium 139, EGFR 22-25  05/01/2016 Creatinine 2.64, BUN 48, Potassium 3.8, Sodium 137  04/24/2016 Creatinine 2.46, BUN 38, Potassium 3.9, Sodium 138, EGFR 23-27  03/26/2016 Creatinine 2.28, BUN 35, Potassium 4.2, Sodium 140  03/07/2016 Creatinine 2.47, BUN 45, Potassium 4.1, Sodium 137 02/15/2016 Creatinine 2.72, BUN 55, Potassium 3.9, Sodium 135  02/05/2016 Creatinine 2.43, BUN 56, Potassium 4.5, Sodium 138  01/31/2016 Creatinine 3.36, BUN 77, Potassium 4.5, Sodium 135  11/16/2015 Creatinine 1.93, BUN 37, Potassium 4.3, Sodium 137  11/05/2016 Creatinine 2.21, BUN 53, Potassium 5.0, Sodium 138  Recommendations: NONE - Unable to reach patient   Follow-up plan: ICM clinic phone appointment on 12/24/2016.  Office appointment scheduled on 01/09/2017 with Dr Acie Fredrickson.  Copy of ICM check sent to cardiologist and device physician.   3 month ICM trend: 11/20/2016   1 Year ICM trend:      Rosalene Billings, RN 11/21/2016 12:50 PM

## 2016-12-02 ENCOUNTER — Telehealth (HOSPITAL_COMMUNITY): Payer: Self-pay | Admitting: *Deleted

## 2016-12-02 NOTE — Telephone Encounter (Signed)
Pt called about swelling, he states his abd and LE are swollen, he is slightly sob.  He states [place] the [place] so he has not been able to weigh.  He is on his way home now and is requesting an appointment.  He states he is taking his Torsemide 40 mg daily, he has not take any Metolazone in a week or 2.  Advised pt to take metolazone when he gets home today and appt sch for tomorrow at 11 am, pt agreeable.

## 2016-12-03 ENCOUNTER — Ambulatory Visit (INDEPENDENT_AMBULATORY_CARE_PROVIDER_SITE_OTHER): Payer: Medicare Other

## 2016-12-03 ENCOUNTER — Ambulatory Visit (HOSPITAL_COMMUNITY)
Admission: RE | Admit: 2016-12-03 | Discharge: 2016-12-03 | Disposition: A | Discharge status: Home / Self Care | Payer: Medicare Other | Source: Ambulatory Visit | Attending: Cardiology | Admitting: Cardiology

## 2016-12-03 VITALS — BP 172/80 | HR 81 | Wt 230.8 lb

## 2016-12-03 DIAGNOSIS — Z86010 Personal history of colonic polyps: Secondary | ICD-10-CM | POA: Insufficient documentation

## 2016-12-03 DIAGNOSIS — I25810 Atherosclerosis of coronary artery bypass graft(s) without angina pectoris: Secondary | ICD-10-CM | POA: Diagnosis not present

## 2016-12-03 DIAGNOSIS — E669 Obesity, unspecified: Secondary | ICD-10-CM | POA: Diagnosis not present

## 2016-12-03 DIAGNOSIS — Z951 Presence of aortocoronary bypass graft: Secondary | ICD-10-CM | POA: Insufficient documentation

## 2016-12-03 DIAGNOSIS — H409 Unspecified glaucoma: Secondary | ICD-10-CM | POA: Insufficient documentation

## 2016-12-03 DIAGNOSIS — J45909 Unspecified asthma, uncomplicated: Secondary | ICD-10-CM | POA: Insufficient documentation

## 2016-12-03 DIAGNOSIS — N185 Chronic kidney disease, stage 5: Secondary | ICD-10-CM

## 2016-12-03 DIAGNOSIS — I10 Essential (primary) hypertension: Secondary | ICD-10-CM

## 2016-12-03 DIAGNOSIS — I5022 Chronic systolic (congestive) heart failure: Secondary | ICD-10-CM | POA: Diagnosis not present

## 2016-12-03 DIAGNOSIS — Z7982 Long term (current) use of aspirin: Secondary | ICD-10-CM | POA: Diagnosis not present

## 2016-12-03 DIAGNOSIS — M1990 Unspecified osteoarthritis, unspecified site: Secondary | ICD-10-CM | POA: Insufficient documentation

## 2016-12-03 DIAGNOSIS — N400 Enlarged prostate without lower urinary tract symptoms: Secondary | ICD-10-CM | POA: Insufficient documentation

## 2016-12-03 DIAGNOSIS — J4540 Moderate persistent asthma, uncomplicated: Secondary | ICD-10-CM

## 2016-12-03 DIAGNOSIS — N184 Chronic kidney disease, stage 4 (severe): Secondary | ICD-10-CM | POA: Insufficient documentation

## 2016-12-03 DIAGNOSIS — Z87891 Personal history of nicotine dependence: Secondary | ICD-10-CM | POA: Insufficient documentation

## 2016-12-03 DIAGNOSIS — I255 Ischemic cardiomyopathy: Secondary | ICD-10-CM | POA: Insufficient documentation

## 2016-12-03 DIAGNOSIS — I130 Hypertensive heart and chronic kidney disease with heart failure and stage 1 through stage 4 chronic kidney disease, or unspecified chronic kidney disease: Secondary | ICD-10-CM | POA: Insufficient documentation

## 2016-12-03 DIAGNOSIS — I2510 Atherosclerotic heart disease of native coronary artery without angina pectoris: Secondary | ICD-10-CM | POA: Diagnosis not present

## 2016-12-03 DIAGNOSIS — I252 Old myocardial infarction: Secondary | ICD-10-CM | POA: Diagnosis not present

## 2016-12-03 DIAGNOSIS — E7800 Pure hypercholesterolemia, unspecified: Secondary | ICD-10-CM | POA: Insufficient documentation

## 2016-12-03 DIAGNOSIS — E1122 Type 2 diabetes mellitus with diabetic chronic kidney disease: Secondary | ICD-10-CM | POA: Diagnosis not present

## 2016-12-03 DIAGNOSIS — Z95810 Presence of automatic (implantable) cardiac defibrillator: Secondary | ICD-10-CM | POA: Insufficient documentation

## 2016-12-03 LAB — BASIC METABOLIC PANEL
Anion gap: 9 (ref 5–15)
BUN: 72 mg/dL — ABNORMAL HIGH (ref 6–20)
CALCIUM: 9 mg/dL (ref 8.9–10.3)
CO2: 29 mmol/L (ref 22–32)
CREATININE: 2.7 mg/dL — AB (ref 0.61–1.24)
Chloride: 100 mmol/L — ABNORMAL LOW (ref 101–111)
GFR, EST AFRICAN AMERICAN: 24 mL/min — AB (ref >60)
GFR, EST NON AFRICAN AMERICAN: 21 mL/min — AB (ref >60)
Glucose, Bld: 180 mg/dL — ABNORMAL HIGH (ref 65–99)
Potassium: 3.5 mmol/L (ref 3.5–5.1)
SODIUM: 138 mmol/L (ref 135–145)

## 2016-12-03 MED ORDER — POTASSIUM CHLORIDE ER 20 MEQ PO TBCR: 20.0000 meq | EXTENDED_RELEASE_TABLET | ORAL | 3 refills | Status: DC | PRN

## 2016-12-03 MED ORDER — HYDRALAZINE HCL 50 MG PO TABS: 50.0000 mg | ORAL_TABLET | Freq: Three times a day (TID) | ORAL | 0 refills | Status: DC

## 2016-12-03 MED ORDER — TORSEMIDE 20 MG PO TABS: 60.0000 mg | ORAL_TABLET | Freq: Every day | ORAL | 0 refills | Status: DC

## 2016-12-03 MED ORDER — METOLAZONE 5 MG PO TABS: 5.0000 mg | ORAL_TABLET | Freq: Every day | ORAL | 0 refills | Status: DC | PRN

## 2016-12-03 MED ORDER — TORSEMIDE 20 MG PO TABS: 60.0000 mg | ORAL_TABLET | Freq: Every day | ORAL | 3 refills | Status: DC

## 2016-12-03 MED ORDER — METOLAZONE 5 MG PO TABS: 5.0000 mg | ORAL_TABLET | Freq: Every day | ORAL | 3 refills | Status: DC | PRN

## 2016-12-03 MED ORDER — HYDRALAZINE HCL 50 MG PO TABS: 50.0000 mg | ORAL_TABLET | Freq: Three times a day (TID) | ORAL | 3 refills | Status: DC

## 2016-12-03 MED ORDER — POTASSIUM CHLORIDE ER 20 MEQ PO TBCR: 20.0000 meq | EXTENDED_RELEASE_TABLET | ORAL | 0 refills | Status: DC | PRN

## 2016-12-03 NOTE — Progress Notes (Signed)
Patient ID: Shane [name], male   DOB: 21-Apr-1934, 83 y.o.   MRN: [id number]    Advanced Heart Failure Clinic Note   Date:  12/03/2016   ID:  Shane [name], DOB 06-21-1934, MRN [id number]  PCP:  [name], MD  Cardiologist:   Nahser  Nephrologist: Hollie Salk  History of Present Illness: Shane [name] is a 83 y.o. male with CAD s/p CABG, systolic HF EF 17-61% and CKD 4 referred to the HF Clinic by Dr. Acie Fredrickson.   He is retired from [employer]. Had been doing fine until March or April when he started having more DOE. Felt he couldn't breath as deeply. MDT ICD was optimized. Took him off Toprol and switched to carvedilol. Also started Entresto. Began to gain fluid. Then lasix was started. (this was new). Entresto stopped. Creatine went from 1.9 to 3.3 then back to to 2.7  Shane [name] presents today for add-on for edema. Weight up 5 lbs since last visit. Entresto stopped due to rising creatinine and lasix switched to torsemide. Since last visit has been more SOB, weight trending up, and having headaches with switch to Bidil. Weight at home up to 225 from 217 (down to 214 with initial switch to torsemide). Gets SOB walking room to room. + bendopnea and orthopnea. Taking metolazone no more than once a week. Took one yesterday with some relief (didn't weight yesterday). Appetite low but did have some fried fish at the beach last week.   Past Medical History:  Diagnosis Date  . Abnormal nuclear cardiac imaging test Nov 2010   moderate area of infarct in the inferior wall with only minimal reversibility and EF of 28%  . AICD (automatic cardioverter/defibrillator) present   . Allergic rhinitis 01-08-13   Uses nebulizer for chronic sinus issues and Mucinex.  . Arthritis    osteoarthritis   . Asthma    Extrinic  . Blood transfusion    ? at time of bypass surgery   . BPH (benign prostatic hyperplasia)   . Cellulitis of arm, left    MSSA  . CHF (congestive heart failure) ([place])   . CKD  (chronic kidney disease), stage III    "was told due to meds he takes"-no Renal workup done  . Colon polyps    adenomatous  . Coronary artery disease    remote CABG in 1985, cath in 2003 by Dr. Lia Foyer with no PCI, last nuclear in 2010 showing scar and EF of 28%.   . Diabetes mellitus   . Dyslipidemia   . Glaucoma 01-08-13   tx. eye drops  . HOH (hard of hearing)   . HTN (hypertension)   . Hypercholesterolemia   . Left ventricular dysfunction    28% per nuclear in 2010 and 35 to 40% per echo in 2010  . Myocardial infarction ([place])    1985  . Neuromuscular disorder ([place])    legs mild paralysis-able to walk"nerve damage"-legs- left leg brace   . Neuropathy   . Pneumonia    hx of several times years ago   . Spinal stenosis     Past Surgical History:  Procedure Laterality Date  . A/V SHUNTOGRAM Right 10/17/2016   Procedure: A/V Fistulagram;  Surgeon: Conrad Reyno, MD;  Location: [place] [facility] LAB;  Service: Cardiovascular;  Laterality: Right;  . BACK SURGERY     hx of back surgery x 4   . BASCILIC VEIN TRANSPOSITION Right 09/09/2016   Procedure: BASCILIC VEIN TRANSPOSITION Right Arm;  Surgeon: Rosetta Posner, MD;  Location: [facility] OR;  Service: Vascular;  Laterality: Right;  . BI-VENTRICULAR IMPLANTABLE CARDIOVERTER DEFIBRILLATOR N/A 10/10/2014   Procedure: BI-VENTRICULAR IMPLANTABLE CARDIOVERTER DEFIBRILLATOR  (CRT-D);  Surgeon: [name], MD;  Location: [facility] C[facility] LAB;  Service: Cardiovascular;  Laterality: N/A;  . CARDIAC CATHETERIZATION  2003  . Cataract      recent cataract surgery 6'14  . COLONOSCOPY N/A 01/25/2013   Procedure: COLONOSCOPY;  Surgeon: Irene Shipper, MD;  Location: [facility] ENDOSCOPY;  Service: Endoscopy;  Laterality: N/A;  . CORONARY ARTERY BYPASS GRAFT  1985   x 5 vessels  . LUMBAR DISC SURGERY    . PERIPHERAL VASCULAR BALLOON ANGIOPLASTY Right 10/17/2016   Procedure: Peripheral Vascular Balloon Angioplasty;  Surgeon: Conrad [place], MD;  Location: [place] CV LAB;   Service: Cardiovascular;  Laterality: Right;  ARM VEINOUS AND CENTRAL VEIN  . PR POLYSOM 6/>YRS SLEEP 4/> ADDL PARAM ATTND  12/07/2015  . PROSTATE SURGERY    . TOTAL KNEE ARTHROPLASTY  08/01/2011   Procedure: TOTAL KNEE ARTHROPLASTY;  Surgeon: Johnn Hai;  Location: [facility] ORS;  Service: Orthopedics;  Laterality: Right;    Current Outpatient Prescriptions  Medication Sig Dispense Refill  . acetaminophen (TYLENOL) 500 MG tablet Take 1,000 mg by mouth every 8 (eight) hours as needed (for pain.).     Marland Kitchen amLODipine (NORVASC) 10 MG tablet Take 1 tablet (10 mg total) by mouth daily. 90 tablet 2  . aspirin EC 81 MG tablet Take 81 mg by mouth daily.     Marland Kitchen atorvastatin (LIPITOR) 10 MG tablet Take 1 tablet (10 mg total) by mouth daily. 90 tablet 3  . carvedilol (COREG) 25 MG tablet Take 1 tablet (25 mg total) by mouth 2 (two) times daily. 180 tablet 0  . cetirizine (ZYRTEC) 10 MG tablet Take 10 mg by mouth at bedtime as needed for allergies.    . CIALIS 20 MG tablet Take 20 mg by mouth daily as needed for erectile dysfunction. DO NOT EXCEED 2 DOSES WITHIN 7 DAYS    . diphenhydrAMINE (BENADRYL) 25 mg capsule Take 25 mg by mouth every 6 (six) hours as needed for allergies.     . fluocinonide cream (LIDEX) 2.70 % Apply 1 application topically 2 (two) times daily as needed (for itchy rash).     . isosorbide-hydrALAZINE (BIDIL) 20-37.5 MG tablet Take 1 tablet by mouth 3 (three) times daily.    . metolazone (ZAROXOLYN) 5 MG tablet Take 1 tablet (5 mg total) by mouth daily as needed. For weight greater than 214 lbs. 90 tablet 3  . omalizumab (XOLAIR) 150 MG injection Inject 150 mg into the skin every 14 (fourteen) days.    Marland Kitchen testosterone cypionate (DEPOTESTOTERONE CYPIONATE) 100 MG/ML injection Inject 400 mg into the muscle every 21 ( twenty-one) days. For IM use only    . torsemide (DEMADEX) 20 MG tablet Take 2 tablets (40 mg total) by mouth daily. 60 tablet 3  . triamcinolone (NASACORT AQ) 55 MCG/ACT AERO  nasal inhaler Place 2 sprays into the nose daily.    Marland Kitchen zolpidem (AMBIEN) 10 MG tablet Take one-half to one tablet by mouth at bedtime as needed for sleep. -- Office visit needed for further refills. 30 tablet 0   No current facility-administered medications for this encounter.    Allergies:   Codeine; Hydrocodone; Seasonal ic [cholestatin]; and Azithromycin   Social History:  The patient  reports that he quit smoking about 58 years ago. His  smoking use included Cigarettes, Pipe, and Cigars. He has a 5.00 pack-year smoking history. He has never used smokeless tobacco. He reports that he does not drink alcohol or use drugs.   Family History:  The patient's family history includes Bone cancer in his brother; Colon polyps in his father; Diabetes in his sister; Heart attack in his father; Heart disease in his brother and father; Lung cancer in his mother.   Review of systems complete and found to be negative unless listed in HPI.    PHYSICAL EXAM: Vitals:   12/03/16 1101  BP: (!) 172/80  Pulse: 81  SpO2: 96%  Weight: 230 lb 12.8 oz (104.7 kg)   Wt Readings from Last 3 Encounters:  12/03/16 230 lb 12.8 oz (104.7 kg)  11/12/16 225 lb (102.1 kg)  11/04/16 224 lb (101.6 kg)   General: Obese and elderly appearing. NAD.  HEENT: Normal.  Neck: Supple, thick. JVP to jaw. Carotids 2+ bilat; soft R carotid bruit. No thyromegaly or nodule noted. Cor: PMI nondisplaced. RRR, No M/G/R noted Lungs: CTAB, normal effort. Abdomen: soft, non-tender, distended, no HSM. No bruits or masses. +BS  Extremities: no cyanosis, clubbing, or rash. 2+ edema to knees. Compression hose in place.  Neuro: alert & orientedx3, cranial nerves grossly intact. moves all 4 extremities w/o difficulty. Affect pleasant   Optivol reviewed personally: Fluid index below threshold with uptrend. Thoracic impedence depressed for past week. Pt activity ~ 3+ hrs daily.   ASSESSMENT AND PLAN:  1. Chronic systolic congestive heart  failure-due to ischemic cardiomyopathy. EF 40-45% echo 4/17. S/p ICD - Chronic NYHA III-IIIb. Volume status markedly elevated on exam and by optivol.  - Increase torsemide to 60 mg daily with weekly metolazone chronically. STAT BMET today with creatinine with slight downtrend due to volume overload - Will have patient take his metolazone for next 1-2 days depending on his weight with 20 meq of potassium (He is to repeat potassium dosing anytime he takes metolazone.  - Continue coreg 25 mg BID.  - Reinforced fluid restriction to < 2 L daily, sodium restriction to less than 2000 mg daily, and the importance of daily weights.   2. Coronary artery disease - history of remote coronary artery bypass grafting 1985 - No CP. No change to current plan.  - Last cath 2003 LM and RCA occluded. LIMA to [name] ok. RIMA to OM ok  SVG to PDA occluded. EF 45% 3. CKD 4 --followed by Dr. Hollie Salk in Nephrology. STAT BMET today with Creatinine 2.7, near his baseline. May limit diuresis.  - Has AVF in place.  4. Hyperlipidemia - Per PCP 5. Diabetes mellitus - Per PCP  6. Mild carotid artery disease.   - Has follow up with repeat US next month.   7. HTN - BP up today. Had to stop Entresto with rising creatinine. - Stop Bidil with HAs. Will start hydralazine alone at 50 mg TID.  - Meds as above.    RTC 4-6 weeks. Dr. Haroldine Laws has reviewed plan with Dr. Hollie Salk who will see him in the interim.   Shirley Friar, PA-C   Patient seen and examined with the above-signed Advanced Practice Provider and/or Housestaff. I personally reviewed laboratory data, imaging studies and relevant notes. I independently examined the patient and formulated the important aspects of the plan. I have edited the note to reflect any of my changes or salient points. I have personally discussed the plan with the patient and/or family.  He is volume overload  again on exam and by ICD interrogation. Suspect a degree of dietary non-compliance  but he says he has been fairly careful. Creatinine fortunately improved a bit. Agree with diuretic changes as above. I called and discussed this with Dr. Hollie Salk who agrees. She will see him back in a few weeks and we will see him several weeks after that.   Glori Bickers, MD  11:48 PM

## 2016-12-03 NOTE — Patient Instructions (Signed)
STOP Bidil  START taking Hydralazine 50 mg (1 Tablet) Three Times Daily  Increase Torsemide to 60 mg (3 Tablets) Once Daily  Today and tomorrow take Metolazone 5 mg with Potassium 20 mEq (1 Tablet).  Also take 1 tablet of potassium if you take Metolazone as needed for weight gain.   Labs next week (BMET)  Follow up in 4-6 weeks with Dr. Haroldine Laws

## 2016-12-03 NOTE — Progress Notes (Signed)
Advanced Heart Failure Medication Review by a Pharmacist  Does the patient  feel that his/her medications are working for him/her?  no  Has the patient been experiencing any side effects to the medications prescribed?  Yes - h/a with Bidil  Does the patient measure his/her own blood pressure or blood glucose at home?  yes   Does the patient have any problems obtaining medications due to transportation or finances?   no  Understanding of regimen: good Understanding of indications: good Potential of compliance: good Patient understands to avoid NSAIDs. Patient understands to avoid decongestants.  Issues to address at subsequent visits: None   Pharmacist comments:  Shane [name] is a pleasant 83 yo M presenting with his medication list. He reports good compliance with his regimen but feels like he is retaining fluid and his BP has trended up since his last hospital discharge. He also reports having a h/a with Bidil that is not well controlled with APAP. He did not have any other medication-related questions or concerns for me at this time.   Ruta Hinds. Velva Harman, PharmD, BCPS, CPP Clinical Pharmacist Pager: [phone] Phone: [phone] 12/03/2016 11:16 AM      Time with patient: 10 minutes Preparation and documentation time: 2 minutes Total time: 12 minutes

## 2016-12-04 ENCOUNTER — Telehealth (HOSPITAL_COMMUNITY): Payer: Self-pay | Admitting: *Deleted

## 2016-12-04 MED ORDER — TORSEMIDE 20 MG PO TABS: 60.0000 mg | ORAL_TABLET | Freq: Every day | ORAL | 0 refills | Status: DC

## 2016-12-04 MED ORDER — HYDRALAZINE HCL 50 MG PO TABS: 50.0000 mg | ORAL_TABLET | Freq: Three times a day (TID) | ORAL | 0 refills | Status: DC

## 2016-12-04 MED ORDER — OMALIZUMAB 150 MG ~~LOC~~ SOLR
300.0000 mg | SUBCUTANEOUS | Status: DC
  Administered 2016-12-03: 300 mg via SUBCUTANEOUS

## 2016-12-04 NOTE — Telephone Encounter (Signed)
Patient called and left message on triage line asking for [place] to call him back.  Called patient back but had to leave message.

## 2016-12-04 NOTE — Progress Notes (Signed)
Xolair injection documentation and charges entered by Lynnell Fiumara, RMA, based on injection sheet filled out by Tammy Scott per office protocol.   

## 2016-12-04 NOTE — Addendum Note (Signed)
Addended by: Scarlette Calico on: 12/04/2016 05:11 PM   Modules accepted: Orders

## 2016-12-04 NOTE — Telephone Encounter (Signed)
Pt called back, he states when he went to get meds from [facility] they did not have the hydralazine or torsemide, per chart both meds were sent there 5/1 at 12:46, both meds resent to [facility].

## 2016-12-10 ENCOUNTER — Telehealth: Payer: Self-pay | Admitting: Pulmonary Disease

## 2016-12-10 ENCOUNTER — Ambulatory Visit (HOSPITAL_COMMUNITY)
Admission: RE | Admit: 2016-12-10 | Discharge: 2016-12-10 | Disposition: A | Discharge status: Home / Self Care | Payer: Medicare Other | Source: Ambulatory Visit | Attending: Internal Medicine | Admitting: Internal Medicine

## 2016-12-10 DIAGNOSIS — I5022 Chronic systolic (congestive) heart failure: Secondary | ICD-10-CM | POA: Diagnosis not present

## 2016-12-10 LAB — BASIC METABOLIC PANEL
Anion gap: 12 (ref 5–15)
BUN: 70 mg/dL — ABNORMAL HIGH (ref 6–20)
CALCIUM: 9 mg/dL (ref 8.9–10.3)
CO2: 27 mmol/L (ref 22–32)
CREATININE: 2.66 mg/dL — AB (ref 0.61–1.24)
Chloride: 99 mmol/L — ABNORMAL LOW (ref 101–111)
GFR calc non Af Amer: 21 mL/min — ABNORMAL LOW (ref >60)
GFR, EST AFRICAN AMERICAN: 24 mL/min — AB (ref >60)
GLUCOSE: 120 mg/dL — AB (ref 65–99)
Potassium: 3.5 mmol/L (ref 3.5–5.1)
Sodium: 138 mmol/L (ref 135–145)

## 2016-12-10 NOTE — Telephone Encounter (Signed)
#   vials:4 Ordered date:12/10/16 Shipping Date:12/10/16

## 2016-12-11 NOTE — Telephone Encounter (Signed)
#   Vials:4 Arrival Date:12/11/16 Lot #:[id number] Exp Date:10/21

## 2016-12-17 ENCOUNTER — Ambulatory Visit (INDEPENDENT_AMBULATORY_CARE_PROVIDER_SITE_OTHER): Payer: Medicare Other

## 2016-12-17 ENCOUNTER — Ambulatory Visit: Payer: Medicare Other

## 2016-12-17 DIAGNOSIS — J4540 Moderate persistent asthma, uncomplicated: Secondary | ICD-10-CM | POA: Diagnosis not present

## 2016-12-18 DIAGNOSIS — E669 Obesity, unspecified: Secondary | ICD-10-CM | POA: Diagnosis not present

## 2016-12-18 DIAGNOSIS — I5022 Chronic systolic (congestive) heart failure: Secondary | ICD-10-CM | POA: Diagnosis not present

## 2016-12-18 DIAGNOSIS — I770 Arteriovenous fistula, acquired: Secondary | ICD-10-CM | POA: Diagnosis not present

## 2016-12-18 DIAGNOSIS — I129 Hypertensive chronic kidney disease with stage 1 through stage 4 chronic kidney disease, or unspecified chronic kidney disease: Secondary | ICD-10-CM | POA: Diagnosis not present

## 2016-12-18 DIAGNOSIS — E1165 Type 2 diabetes mellitus with hyperglycemia: Secondary | ICD-10-CM | POA: Diagnosis not present

## 2016-12-18 DIAGNOSIS — E1129 Type 2 diabetes mellitus with other diabetic kidney complication: Secondary | ICD-10-CM | POA: Diagnosis not present

## 2016-12-18 DIAGNOSIS — N184 Chronic kidney disease, stage 4 (severe): Secondary | ICD-10-CM | POA: Diagnosis not present

## 2016-12-18 MED ORDER — OMALIZUMAB 150 MG ~~LOC~~ SOLR
300.0000 mg | SUBCUTANEOUS | Status: DC
  Administered 2016-12-17: 300 mg via SUBCUTANEOUS

## 2016-12-18 NOTE — Progress Notes (Signed)
Xolair injection documentation and charges entered by Catelynn Sparger, RMA, based on injection sheet filled out by Tammy Scott per office protocol.   

## 2016-12-24 ENCOUNTER — Ambulatory Visit (INDEPENDENT_AMBULATORY_CARE_PROVIDER_SITE_OTHER): Payer: Medicare Other

## 2016-12-24 ENCOUNTER — Other Ambulatory Visit: Payer: Self-pay | Admitting: Internal Medicine

## 2016-12-24 DIAGNOSIS — G4700 Insomnia, unspecified: Secondary | ICD-10-CM

## 2016-12-24 DIAGNOSIS — Z95810 Presence of automatic (implantable) cardiac defibrillator: Secondary | ICD-10-CM

## 2016-12-24 DIAGNOSIS — I5022 Chronic systolic (congestive) heart failure: Secondary | ICD-10-CM | POA: Diagnosis not present

## 2016-12-24 NOTE — Progress Notes (Signed)
EPIC Encounter for ICM Monitoring  Patient Name: Shane [name] is a 83 y.o. male Date: 12/24/2016 Primary Care Physican: [name], MD Primary Cardiologist:Nahser/Bensimhon Electrophysiologist: Caryl Comes Nephrologist: Hollie Salk at [facility] Weight: 212 lbs Bi-V Pacing: 95.8%        Heart Failure questions reviewed, pt asymptomatic now.   Thoracic impedance normal.  He took a Metolazone at the end of April for weight gain and fluid retention.  Prescribed dosage: Torsemide 40 mg 1 tablet daily.  Metolazone 5 mg 1 tablet for weight gain > 214 lbs  Labs: 12/10/2016 Creatinine 2.66, BUN 70, Potassium 3.5, Sodium 138, EGFR 21-24 12/03/2016 Creatinine 2.70, BUN 72, Potassium 3.5, Sodium 138, EGFR 21-24 11/04/2016 Creatinine 3.27, BUN 79, Potassium 3.9, Sodium 137, EGFR 16-19 10/18/2016 Creatinine 2.7,   BUN 63. Potassium 3.6, Sodium 138 10/02/2016 Creatinine 2.43, BUN 66, Potassium 3.6, Sodium 139, EGFR 24-28 11/27/2017Creatinine 3.09, BUN 74, Potassium 4.3, Sodium 136 06/07/2016 Creatinine 2.59, BUN 65, Potassium 3.6, Sodium 139, EGFR 22-25  05/01/2016 Creatinine 2.64, BUN 48, Potassium 3.8, Sodium 137  04/24/2016 Creatinine 2.46, BUN 38, Potassium 3.9, Sodium 138, EGFR 23-27  03/26/2016 Creatinine 2.28, BUN 35, Potassium 4.2, Sodium 140  03/07/2016 Creatinine 2.47, BUN 45, Potassium 4.1, Sodium 137 02/15/2016 Creatinine 2.72, BUN 55, Potassium 3.9, Sodium 135  02/05/2016 Creatinine 2.43, BUN 56, Potassium 4.5, Sodium 138  01/31/2016 Creatinine 3.36, BUN 77, Potassium 4.5, Sodium 135  11/16/2015 Creatinine 1.93, BUN 37, Potassium 4.3, Sodium 137  11/05/2016 Creatinine 2.21, BUN 53, Potassium 5.0, Sodium 138  Recommendations: No changes. Discussed to limit salt intake to 2000 mg/day and fluid intake to < 2 liters/day.  Encouraged to call for fluid symptoms or use local ER for any urgent symptoms.  Follow-up plan: ICM clinic phone appointment on 01/27/2017.  Office  appointment scheduled on 01/08/2017 with Dr Haroldine Laws and 01/09/2017 with Dr Acie Fredrickson.  Copy of ICM check sent to device physician.   3 month ICM trend: 12/24/2016   1 Year ICM trend:      Rosalene Billings, RN 12/24/2016 11:48 AM

## 2016-12-27 ENCOUNTER — Encounter: Payer: Medicare Other | Admitting: Internal Medicine

## 2016-12-31 ENCOUNTER — Other Ambulatory Visit: Payer: Self-pay | Admitting: Internal Medicine

## 2016-12-31 ENCOUNTER — Ambulatory Visit: Payer: Medicare Other

## 2017-01-02 ENCOUNTER — Other Ambulatory Visit: Payer: Self-pay | Admitting: Internal Medicine

## 2017-01-02 ENCOUNTER — Inpatient Hospital Stay (HOSPITAL_COMMUNITY): Admission: RE | Admit: 2017-01-02 | Payer: Medicare Other | Source: Ambulatory Visit

## 2017-01-06 ENCOUNTER — Encounter (HOSPITAL_COMMUNITY): Payer: Medicare Other

## 2017-01-07 ENCOUNTER — Ambulatory Visit (HOSPITAL_COMMUNITY)
Admission: RE | Admit: 2017-01-07 | Discharge: 2017-01-07 | Disposition: A | Discharge status: Home / Self Care | Payer: Medicare Other | Source: Ambulatory Visit | Attending: Cardiovascular Disease | Admitting: Cardiovascular Disease

## 2017-01-07 DIAGNOSIS — I739 Peripheral vascular disease, unspecified: Secondary | ICD-10-CM

## 2017-01-07 DIAGNOSIS — I779 Disorder of arteries and arterioles, unspecified: Secondary | ICD-10-CM | POA: Diagnosis not present

## 2017-01-07 DIAGNOSIS — I6523 Occlusion and stenosis of bilateral carotid arteries: Secondary | ICD-10-CM | POA: Insufficient documentation

## 2017-01-08 ENCOUNTER — Ambulatory Visit (HOSPITAL_COMMUNITY)
Admission: RE | Admit: 2017-01-08 | Discharge: 2017-01-08 | Disposition: A | Discharge status: Home / Self Care | Payer: Medicare Other | Source: Ambulatory Visit | Attending: Internal Medicine | Admitting: Internal Medicine

## 2017-01-08 ENCOUNTER — Encounter (HOSPITAL_COMMUNITY): Payer: Self-pay | Admitting: Internal Medicine

## 2017-01-08 ENCOUNTER — Telehealth: Payer: Self-pay | Admitting: *Deleted

## 2017-01-08 VITALS — BP 140/72 | HR 73 | Wt 218.2 lb

## 2017-01-08 DIAGNOSIS — N184 Chronic kidney disease, stage 4 (severe): Secondary | ICD-10-CM | POA: Diagnosis not present

## 2017-01-08 DIAGNOSIS — Z9889 Other specified postprocedural states: Secondary | ICD-10-CM | POA: Insufficient documentation

## 2017-01-08 DIAGNOSIS — I255 Ischemic cardiomyopathy: Secondary | ICD-10-CM | POA: Insufficient documentation

## 2017-01-08 DIAGNOSIS — I5022 Chronic systolic (congestive) heart failure: Secondary | ICD-10-CM | POA: Diagnosis not present

## 2017-01-08 DIAGNOSIS — Z8371 Family history of colonic polyps: Secondary | ICD-10-CM | POA: Diagnosis not present

## 2017-01-08 DIAGNOSIS — Z7982 Long term (current) use of aspirin: Secondary | ICD-10-CM | POA: Insufficient documentation

## 2017-01-08 DIAGNOSIS — I130 Hypertensive heart and chronic kidney disease with heart failure and stage 1 through stage 4 chronic kidney disease, or unspecified chronic kidney disease: Secondary | ICD-10-CM | POA: Insufficient documentation

## 2017-01-08 DIAGNOSIS — I252 Old myocardial infarction: Secondary | ICD-10-CM | POA: Diagnosis not present

## 2017-01-08 DIAGNOSIS — I2510 Atherosclerotic heart disease of native coronary artery without angina pectoris: Secondary | ICD-10-CM

## 2017-01-08 DIAGNOSIS — F5101 Primary insomnia: Secondary | ICD-10-CM

## 2017-01-08 DIAGNOSIS — Z833 Family history of diabetes mellitus: Secondary | ICD-10-CM | POA: Diagnosis not present

## 2017-01-08 DIAGNOSIS — Z801 Family history of malignant neoplasm of trachea, bronchus and lung: Secondary | ICD-10-CM | POA: Insufficient documentation

## 2017-01-08 DIAGNOSIS — Z79899 Other long term (current) drug therapy: Secondary | ICD-10-CM | POA: Diagnosis not present

## 2017-01-08 DIAGNOSIS — Z809 Family history of malignant neoplasm, unspecified: Secondary | ICD-10-CM | POA: Insufficient documentation

## 2017-01-08 DIAGNOSIS — E1122 Type 2 diabetes mellitus with diabetic chronic kidney disease: Secondary | ICD-10-CM | POA: Diagnosis not present

## 2017-01-08 DIAGNOSIS — I10 Essential (primary) hypertension: Secondary | ICD-10-CM

## 2017-01-08 DIAGNOSIS — Z87891 Personal history of nicotine dependence: Secondary | ICD-10-CM | POA: Diagnosis not present

## 2017-01-08 DIAGNOSIS — Z8249 Family history of ischemic heart disease and other diseases of the circulatory system: Secondary | ICD-10-CM | POA: Diagnosis not present

## 2017-01-08 DIAGNOSIS — N185 Chronic kidney disease, stage 5: Secondary | ICD-10-CM | POA: Diagnosis not present

## 2017-01-08 LAB — BASIC METABOLIC PANEL
ANION GAP: 12 (ref 5–15)
BUN: 82 mg/dL — ABNORMAL HIGH (ref 6–20)
CALCIUM: 9.2 mg/dL (ref 8.9–10.3)
CO2: 30 mmol/L (ref 22–32)
Chloride: 94 mmol/L — ABNORMAL LOW (ref 101–111)
Creatinine, Ser: 2.98 mg/dL — ABNORMAL HIGH (ref 0.61–1.24)
GFR, EST AFRICAN AMERICAN: 21 mL/min — AB (ref >60)
GFR, EST NON AFRICAN AMERICAN: 18 mL/min — AB (ref >60)
Glucose, Bld: 107 mg/dL — ABNORMAL HIGH (ref 65–99)
POTASSIUM: 3 mmol/L — AB (ref 3.5–5.1)
SODIUM: 136 mmol/L (ref 135–145)

## 2017-01-08 MED ORDER — ZOLPIDEM TARTRATE 10 MG PO TABS: ORAL_TABLET | ORAL | 1 refills | Status: DC

## 2017-01-08 NOTE — Telephone Encounter (Signed)
Ok to refill 

## 2017-01-08 NOTE — Telephone Encounter (Signed)
Called pt to inform MD ok refill needing pharmacy info. Called rx into [facility] had to leave on pharmacy vm...lmb

## 2017-01-08 NOTE — Patient Instructions (Signed)
Labs today  We will contact you in 6 months to schedule your next appointment.  

## 2017-01-08 NOTE — Progress Notes (Signed)
Patient ID: Shane [name], male   DOB: Dec 15, 1933, 83 y.o.   MRN: [id number]    Advanced Heart Failure Clinic Note   Date:  01/08/2017   ID:  Shane [name], DOB May 19, 1934, MRN [id number]  PCP:  [name], MD  Cardiologist:   Nahser  Nephrologist: Hollie Salk  History of Present Illness: Shane [name] is a 83 y.o. male with CAD s/p CABG, systolic HF EF 88-50% and CKD 4 referred to the HF Clinic by Dr. Acie Fredrickson.   He is retired from [employer]. Had been doing fine until March or April when he started having more DOE. Felt he couldn't breath as deeply. MDT ICD was optimized. Took him off Toprol and switched to carvedilol. Also started Entresto. Began to gain fluid. Then lasix was started. (this was new). Entresto stopped. Creatine went from 1.9 to 3.3 then back to to 2.7  Shane [name] presents today for routine f/u. We saw him last month and was fluid overloaded. Took metolalzone for 3 days and weight down to 207. Has done fairly well since that time. Can do all ADLs without too much problem. Gets SOB with steps or bending over. We told him to keep weight under 214. On Saturday got to 219 and took metolazone. Today was 211. No CP. Mild edema. Too claustrophobic to tolerate CPAP.   SBP typically in 130s   ICD interrogated personally: No VT/AF Optivol up and down. Now down activity 3-4 hrs/day  Past Medical History:  Diagnosis Date  . Abnormal nuclear cardiac imaging test Nov 2010   moderate area of infarct in the inferior wall with only minimal reversibility and EF of 28%  . AICD (automatic cardioverter/defibrillator) present   . Allergic rhinitis 01-08-13   Uses nebulizer for chronic sinus issues and Mucinex.  . Arthritis    osteoarthritis   . Asthma    Extrinic  . Blood transfusion    ? at time of bypass surgery   . BPH (benign prostatic hyperplasia)   . Cellulitis of arm, left    MSSA  . CHF (congestive heart failure) ([place])   . CKD (chronic kidney disease), stage III    "was told due to meds he takes"-no Renal workup done  . Colon polyps    adenomatous  . Coronary artery disease    remote CABG in 1985, cath in 2003 by Dr. Lia Foyer with no PCI, last nuclear in 2010 showing scar and EF of 28%.   . Diabetes mellitus   . Dyslipidemia   . Glaucoma 01-08-13   tx. eye drops  . HOH (hard of hearing)   . HTN (hypertension)   . Hypercholesterolemia   . Left ventricular dysfunction    28% per nuclear in 2010 and 35 to 40% per echo in 2010  . Myocardial infarction ([place])    1985  . Neuromuscular disorder ([place])    legs mild paralysis-able to walk"nerve damage"-legs- left leg brace   . Neuropathy   . Pneumonia    hx of several times years ago   . Spinal stenosis     Past Surgical History:  Procedure Laterality Date  . A/V SHUNTOGRAM Right 10/17/2016   Procedure: A/V Fistulagram;  Surgeon: Conrad Bruno, MD;  Location: [facility];  Service: Cardiovascular;  Laterality: Right;  . BACK SURGERY     hx of back surgery x 4   . BASCILIC VEIN TRANSPOSITION Right 09/09/2016   Procedure: BASCILIC VEIN TRANSPOSITION Right Arm;  Surgeon: Sherren Mocha  Katina Dung, MD;  Location: [facility];  Service: Vascular;  Laterality: Right;  . BI-VENTRICULAR IMPLANTABLE CARDIOVERTER DEFIBRILLATOR N/A 10/10/2014   Procedure: BI-VENTRICULAR IMPLANTABLE CARDIOVERTER DEFIBRILLATOR  (CRT-D);  Surgeon: [name], MD;  [facility];  Service: Cardiovascular;  Laterality: N/A;  . CARDIAC CATHETERIZATION  2003  . Cataract      recent cataract surgery 6'14  . COLONOSCOPY N/A 01/25/2013   Procedure: COLONOSCOPY;  Surgeon: Irene Shipper, MD;  [facility]: [facility];  Service: Endoscopy;  Laterality: N/A;  . CORONARY ARTERY BYPASS GRAFT  1985   x 5 vessels  . LUMBAR DISC SURGERY    . PERIPHERAL VASCULAR BALLOON ANGIOPLASTY Right 10/17/2016   Procedure: Peripheral Vascular Balloon Angioplasty;  Surgeon: Conrad Parmelee, MD;  Location: [facility];  Service: Cardiovascular;  Laterality: Right;   ARM VEINOUS AND CENTRAL VEIN  . PR POLYSOM 6/>YRS SLEEP 4/> ADDL PARAM ATTND  12/07/2015  . PROSTATE SURGERY    . TOTAL KNEE ARTHROPLASTY  08/01/2011   Procedure: TOTAL KNEE ARTHROPLASTY;  Surgeon: Johnn Hai;  Location: [facility];  Service: Orthopedics;  Laterality: Right;    Current Outpatient Prescriptions  Medication Sig Dispense Refill  . acetaminophen (TYLENOL) 500 MG tablet Take 1,000 mg by mouth every 8 (eight) hours as needed (for pain.).     Marland Kitchen amLODipine (NORVASC) 10 MG tablet Take 1 tablet (10 mg total) by mouth daily. 90 tablet 2  . aspirin EC 81 MG tablet Take 81 mg by mouth daily.     Marland Kitchen atorvastatin (LIPITOR) 10 MG tablet Take 1 tablet (10 mg total) by mouth daily. 90 tablet 3  . carvedilol (COREG) 25 MG tablet Take 1 tablet (25 mg total) by mouth 2 (two) times daily. 180 tablet 2  . cetirizine (ZYRTEC) 10 MG tablet Take 10 mg by mouth at bedtime as needed for allergies.    . CIALIS 20 MG tablet Take 20 mg by mouth daily as needed for erectile dysfunction. DO NOT EXCEED 2 DOSES WITHIN 7 DAYS    . diphenhydrAMINE (BENADRYL) 25 mg capsule Take 25 mg by mouth every 6 (six) hours as needed for allergies.     . fluocinonide cream (LIDEX) 1.49 % Apply 1 application topically 2 (two) times daily as needed (for itchy rash).     . hydrALAZINE (APRESOLINE) 50 MG tablet Take 1 tablet (50 mg total) by mouth 3 (three) times daily. 90 tablet 0  . omalizumab (XOLAIR) 150 MG injection Inject 150 mg into the skin every 14 (fourteen) days.    Marland Kitchen testosterone cypionate (DEPOTESTOTERONE CYPIONATE) 100 MG/ML injection Inject 400 mg into the muscle every 21 ( twenty-one) days. For IM use only    . torsemide (DEMADEX) 20 MG tablet Take 3 tablets (60 mg total) by mouth daily. 90 tablet 0  . triamcinolone (NASACORT AQ) 55 MCG/ACT AERO nasal inhaler Place 2 sprays into the nose daily.    . metolazone (ZAROXOLYN) 5 MG tablet Take 1 tablet (5 mg total) by mouth daily as needed. For weight greater than 214  lbs. (Patient not taking: Reported on 01/08/2017) 30 tablet 0  . Potassium Chloride ER 20 MEQ TBCR Take 20 mEq by mouth as needed (Take 1 Tablet with metolazone). (Patient not taking: Reported on 01/08/2017) 30 tablet 0  . zolpidem (AMBIEN) 10 MG tablet Take one-half to one tablet by mouth at bedtime as needed for sleep. 30 tablet 1   Current Facility-Administered Medications  Medication Dose Route Frequency  Provider Last Rate Last Dose  . omalizumab Arvid Right) injection 300 mg  300 mg Subcutaneous Q14 Days Javier Glazier, MD   300 mg at 12/03/16 0859  . omalizumab Arvid Right) injection 300 mg  300 mg Subcutaneous Q14 Days Javier Glazier, MD   300 mg at 12/17/16 5409   Allergies:   Codeine; Hydrocodone; Seasonal ic [cholestatin]; and Azithromycin   Social History:  The patient  reports that he quit smoking about 58 years ago. His smoking use included Cigarettes, Pipe, and Cigars. He has a 5.00 pack-year smoking history. He has never used smokeless tobacco. He reports that he does not drink alcohol or use drugs.   Family History:  The patient's family history includes Bone cancer in his brother; Colon polyps in his father; Diabetes in his sister; Heart attack in his father; Heart disease in his brother and father; Lung cancer in his mother.   Review of systems complete and found to be negative unless listed in HPI.    PHYSICAL EXAM: Vitals:   01/08/17 1429  BP: 140/72  Pulse: 73  SpO2: 97%  Weight: 218 lb 4 oz (99 kg)   Wt Readings from Last 3 Encounters:  01/08/17 218 lb 4 oz (99 kg)  12/03/16 230 lb 12.8 oz (104.7 kg)  11/12/16 225 lb (102.1 kg)   General:  Well appearing. No resp difficulty HEENT: normal Neck: supple. JVP 6 . Carotids 2+ bilat; no bruits. No lymphadenopathy or thryomegaly appreciated. Cor: PMI nondisplaced. Regular rate & rhythm. 2/6 TR . Lungs: clear Abdomen: obese soft, nontender, nondistended. No hepatosplenomegaly. No bruits or masses. Good bowel  sounds. Extremities: no cyanosis, clubbing, rash, edema Neuro: alert & orientedx3, cranial nerves grossly intact. moves all 4 extremities w/o difficulty. Affect pleasant   ASSESSMENT AND PLAN:  1. Chronic systolic congestive heart failure-due to ischemic cardiomyopathy. EF 40-45% echo 4/17. S/p ICD - Improved today. Volume status looks good on exam and by Optivol. (ICD interrogated personally in clinic) - Continue torsemide to 60 mg daily. He is doing a good job using torsemide to keep his weight in 210-214 range to balance his volume status with his advanced CKD,  - Continue coreg 25 mg BID.  - Again reinforced need to be more careful with his intake and to take his scale with him to the beach,   2. Coronary artery disease - history of remote coronary artery bypass grafting 1985 - No signs/symptoms of ischmia. - Last cath 2003 LM and RCA occluded. LIMA to LAD ok. RIMA to OM ok  SVG to PDA occluded. EF 45% 3. CKD 4 --followed by Dr. Hollie Salk in Nephrology.  --BMET today with stable renal function -- Has AVF in place.  4. Hyperlipidemia - Per PCP 5. Diabetes mellitus - Per PCP  6. Mild carotid artery disease.   - Per Dr. Acie Fredrickson.   7. HTN - BP improved. Would not be overly aggressive with advanced CKD. Average BP currently in 120-130 range.  - Had to stop Entresto with rising creatinine. - Had to stop Bidil with HAs. Continue hydralazine 50 mg TID.  - Meds as above.     Glori Bickers, MD  3:10 PM

## 2017-01-08 NOTE — Telephone Encounter (Signed)
Pt left msg on triage stating he has made appt for 7/9, but he is needing to get a refill on his ambien b4 for appt...Shane [name]

## 2017-01-09 ENCOUNTER — Ambulatory Visit (INDEPENDENT_AMBULATORY_CARE_PROVIDER_SITE_OTHER): Payer: Medicare Other | Admitting: Cardiovascular Disease

## 2017-01-09 ENCOUNTER — Encounter: Payer: Self-pay | Admitting: Cardiovascular Disease

## 2017-01-09 VITALS — BP 130/80 | HR 69 | Ht 70.0 in | Wt 216.4 lb

## 2017-01-09 DIAGNOSIS — I209 Angina pectoris, unspecified: Secondary | ICD-10-CM | POA: Diagnosis not present

## 2017-01-09 DIAGNOSIS — I255 Ischemic cardiomyopathy: Secondary | ICD-10-CM

## 2017-01-09 DIAGNOSIS — I779 Disorder of arteries and arterioles, unspecified: Secondary | ICD-10-CM | POA: Insufficient documentation

## 2017-01-09 DIAGNOSIS — I739 Peripheral vascular disease, unspecified: Secondary | ICD-10-CM

## 2017-01-09 DIAGNOSIS — I25119 Atherosclerotic heart disease of native coronary artery with unspecified angina pectoris: Secondary | ICD-10-CM

## 2017-01-09 NOTE — Patient Instructions (Signed)
Medication Instructions:  Your physician recommends that you continue on your current medications as directed. Please refer to the Current Medication list given to you today.   Labwork: None Ordered   Testing/Procedures: None Ordered   Follow-Up: Your physician recommends that you schedule a follow-up appointment in: 3 months with Dr. Nahser   If you need a refill on your cardiac medications before your next appointment, please call your pharmacy.   Thank you for choosing [facility]! Kutter Schnepf, RN [phone]    

## 2017-01-09 NOTE — Progress Notes (Signed)
Cardiology Office Note   Date:  01/09/2017   ID:  Shane [name], DOB 12/19/1933, MRN [id number]  PCP:  [name], MD  Cardiologist:   Mertie Moores, MD   Chief Complaint  Patient presents with  . Coronary Artery Disease   Problem list 1. Coronary artery disease - history of remote coronary artery bypass grafting 2. Chronic systolic congestive heart failure-due to ischemic cardiopathy 3. History of ICD placement - Klein  4. Hyperlipidemia 5. Diabetes mellitus  6. Mild carotid artery disease.    Shane [name] is a 83 y.o. male who presents for follow up of his CAD and CHF. Was last seen by Dr. Mare Ferrari several weeks .   No CP , no dyspnea.  Rides an exercise bike.   Has a bad ankle. Has had right knee replacement  - still has trouble with the left knee  Is retired from [employer] Strategy [employer].   Sold Maalox  Is currently a pastor , reads quite a bit .  Nov. 27, 2017:  Shane [name] is also seen in CHF clinic .  His chronic kidney disease. His last creatinine is 2.59 Takes Metalozone about once a week .  Able to get out and do his normal activities.   Gets fatigued easily   June 7,2018: Shane [name] is doing wel Carotid artery scan was stable.   Will repeat in 2 years.  Remains active Has CKD,   Followed by nephrology and Bensimhon    Past Medical History:  Diagnosis Date  . Abnormal nuclear cardiac imaging test Nov 2010   moderate area of infarct in the inferior wall with only minimal reversibility and EF of 28%  . AICD (automatic cardioverter/defibrillator) present   . Allergic rhinitis 01-08-13   Uses nebulizer for chronic sinus issues and Mucinex.  . Arthritis    osteoarthritis   . Asthma    Extrinic  . Blood transfusion    ? at time of bypass surgery   . BPH (benign prostatic hyperplasia)   . Cellulitis of arm, left    MSSA  . CHF (congestive heart failure) ([place])   . CKD (chronic kidney disease), stage III    "was told due to meds he takes"-no Renal workup  done  . Colon polyps    adenomatous  . Coronary artery disease    remote CABG in 1985, cath in 2003 by Dr. Lia Foyer with no PCI, last nuclear in 2010 showing scar and EF of 28%.   . Diabetes mellitus   . Dyslipidemia   . Glaucoma 01-08-13   tx. eye drops  . HOH (hard of hearing)   . HTN (hypertension)   . Hypercholesterolemia   . Left ventricular dysfunction    28% per nuclear in 2010 and 35 to 40% per echo in 2010  . Myocardial infarction ([place])    1985  . Neuromuscular disorder ([place])    legs mild paralysis-able to walk"nerve damage"-legs- left leg brace   . Neuropathy   . Pneumonia    hx of several times years ago   . Spinal stenosis     Past Surgical History:  Procedure Laterality Date  . A/V SHUNTOGRAM Right 10/17/2016   Procedure: A/V Fistulagram;  Surgeon: Conrad Druid Hills, MD;  Location: [facility];  Service: Cardiovascular;  Laterality: Right;  . BACK SURGERY     hx of back surgery x 4   . BASCILIC VEIN TRANSPOSITION Right 09/09/2016   Procedure: BASCILIC VEIN TRANSPOSITION Right Arm;  Surgeon:  Rosetta Posner, MD;  Location: [facility] OR;  Service: Vascular;  Laterality: Right;  . BI-VENTRICULAR IMPLANTABLE CARDIOVERTER DEFIBRILLATOR N/A 10/10/2014   Procedure: BI-VENTRICULAR IMPLANTABLE CARDIOVERTER DEFIBRILLATOR  (CRT-D);  Surgeon: [name], MD;  Location: [facility] CATH LAB;  Service: Cardiovascular;  Laterality: N/A;  . CARDIAC CATHETERIZATION  2003  . Cataract      recent cataract surgery 6'14  . COLONOSCOPY N/A 01/25/2013   Procedure: COLONOSCOPY;  Surgeon: Irene Shipper, MD;  [facility];  Service: Endoscopy;  Laterality: N/A;  . CORONARY ARTERY BYPASS GRAFT  1985   x 5 vessels  . LUMBAR DISC SURGERY    . PERIPHERAL VASCULAR BALLOON ANGIOPLASTY Right 10/17/2016   Procedure: Peripheral Vascular Balloon Angioplasty;  Surgeon: Conrad Omaha, MD;  Location: [facility];  Service: Cardiovascular;  Laterality: Right;  ARM VEINOUS AND CENTRAL VEIN  . PR POLYSOM  6/>YRS SLEEP 4/> ADDL PARAM ATTND  12/07/2015  . PROSTATE SURGERY    . TOTAL KNEE ARTHROPLASTY  08/01/2011   Procedure: TOTAL KNEE ARTHROPLASTY;  Surgeon: Johnn Hai;  [facility];  Service: Orthopedics;  Laterality: Right;     Current Outpatient Prescriptions  Medication Sig Dispense Refill  . acetaminophen (TYLENOL) 500 MG tablet Take 1,000 mg by mouth every 8 (eight) hours as needed (for pain.).     Marland Kitchen amLODipine (NORVASC) 10 MG tablet Take 1 tablet (10 mg total) by mouth daily. 90 tablet 2  . aspirin EC 81 MG tablet Take 81 mg by mouth daily.     Marland Kitchen atorvastatin (LIPITOR) 10 MG tablet Take 1 tablet (10 mg total) by mouth daily. 90 tablet 3  . carvedilol (COREG) 25 MG tablet Take 1 tablet (25 mg total) by mouth 2 (two) times daily. 180 tablet 2  . cetirizine (ZYRTEC) 10 MG tablet Take 10 mg by mouth at bedtime as needed for allergies.    . CIALIS 20 MG tablet Take 20 mg by mouth daily as needed for erectile dysfunction. DO NOT EXCEED 2 DOSES WITHIN 7 DAYS    . diphenhydrAMINE (BENADRYL) 25 mg capsule Take 25 mg by mouth every 6 (six) hours as needed for allergies.     . fluocinonide cream (LIDEX) 0.24 % Apply 1 application topically 2 (two) times daily as needed (for itchy rash).     . hydrALAZINE (APRESOLINE) 50 MG tablet Take 1 tablet (50 mg total) by mouth 3 (three) times daily. 90 tablet 0  . omalizumab (XOLAIR) 150 MG injection Inject 150 mg into the skin every 14 (fourteen) days.    Marland Kitchen testosterone cypionate (DEPOTESTOTERONE CYPIONATE) 100 MG/ML injection Inject 400 mg into the muscle every 21 ( twenty-one) days. For IM use only    . torsemide (DEMADEX) 20 MG tablet Take 3 tablets (60 mg total) by mouth daily. 90 tablet 0  . triamcinolone (NASACORT AQ) 55 MCG/ACT AERO nasal inhaler Place 2 sprays into the nose daily.    Marland Kitchen zolpidem (AMBIEN) 10 MG tablet Take one-half to one tablet by mouth at bedtime as needed for sleep. 30 tablet 1   Current Facility-Administered Medications    Medication Dose Route Frequency Provider Last Rate Last Dose  . omalizumab Arvid Right) injection 300 mg  300 mg Subcutaneous Q14 Days Javier Glazier, MD   300 mg at 12/03/16 0859  . omalizumab Arvid Right) injection 300 mg  300 mg Subcutaneous Q14 Days Javier Glazier, MD   300 mg at 12/17/16 0973    Allergies:  Codeine; Hydrocodone; Seasonal ic [cholestatin]; and Azithromycin    Social History:  The patient  reports that he quit smoking about 58 years ago. His smoking use included Cigarettes, Pipe, and Cigars. He has a 5.00 pack-year smoking history. He has never used smokeless tobacco. He reports that he does not drink alcohol or use drugs.   Family History:  The patient's family history includes Bone cancer in his brother; Colon polyps in his father; Diabetes in his sister; Heart attack in his father; Heart disease in his brother and father; Lung cancer in his mother.    ROS:  Please see the history of present illness.    Review of Systems: Constitutional:  denies fever, chills, diaphoresis, appetite change and fatigue.  HEENT: denies photophobia, eye pain, redness, hearing loss, ear pain, congestion, sore throat, rhinorrhea, sneezing, neck pain, neck stiffness and tinnitus.  Respiratory: denies SOB, DOE, cough, chest tightness, and wheezing.  Cardiovascular: denies chest pain, palpitations and leg swelling.  Gastrointestinal: denies nausea, vomiting, abdominal pain, diarrhea, constipation, blood in stool.  Genitourinary: denies dysuria, urgency, frequency, hematuria, flank pain and difficulty urinating.  Musculoskeletal: denies  myalgias, back pain, joint swelling, arthralgias and gait problem.   Skin: denies pallor, rash and wound.  Neurological: denies dizziness, seizures, syncope, weakness, light-headedness, numbness and headaches.   Hematological: denies adenopathy, easy bruising, personal or family bleeding history.  Psychiatric/ Behavioral: denies suicidal ideation, mood  changes, confusion, nervousness, sleep disturbance and agitation.       All other systems are reviewed and negative.    PHYSICAL EXAM: VS:  BP 130/80   Pulse 69   Ht 5\' 10"  (1.778 m)   Wt 216 lb 6 oz (98.1 kg)   SpO2 97%   BMI 31.05 kg/m  , BMI Body mass index is 31.05 kg/m. GEN: Well nourished, well developed, in no acute distress  HEENT: normal  Neck: no JVD,  Possible soft right carotid bruit, or masses Cardiac: RRR; no murmurs, rubs, or gallops,no edema  Respiratory:  clear to auscultation bilaterally, normal work of breathing GI: soft, nontender, nondistended, + BS MS: no deformity or atrophy  Skin: warm and dry, no rash Neuro:  Strength and sensation are intact Psych: normal   EKG:  EKG is not ordered today.    Recent Labs: 03/07/2016: B Natriuretic Peptide 127.6 10/18/2016: ALT 18; Hemoglobin 14.4; Platelets 110 01/08/2017: BUN 82; Creatinine, Ser 2.98; Potassium 3.0; Sodium 136    Lipid Panel    Component Value Date/Time   CHOL 104 (L) 09/18/2015 0955   TRIG 97 09/18/2015 0955   HDL 25 (L) 09/18/2015 0955   CHOLHDL 4.2 09/18/2015 0955   VLDL 19 09/18/2015 0955   LDLCALC 60 09/18/2015 0955      Wt Readings from Last 3 Encounters:  01/09/17 216 lb 6 oz (98.1 kg)  01/08/17 218 lb 4 oz (99 kg)  12/03/16 230 lb 12.8 oz (104.7 kg)      Other studies Reviewed: Additional studies/ records that were reviewed today include: . Review of the above records demonstrates:    ASSESSMENT AND PLAN:  1. Coronary artery disease - history of remote coronary artery bypass grafting He is not having any angina   2. Chronic systolic congestive heart failure-due to ischemic cardiopathy - EF 40-45% by echo April, 1610 Complicated by his CKD  Takes metalazone as needed for weight > 214.  Averages 1 a week.   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  3. History of ICD  placement - Klein   4. Hyperlipidemia-   will check lipids and CMET in 6 months at his next office  visit   5. Diabetes mellitus -    6. Mild carotid artery disease.  - He has a very soft bruit heard at his last visit.  He has stable mild carotid disease.   Will repeat the carotid duplex scan in 2 years .    Current medicines are reviewed at length with the patient today.  The patient does not have concerns regarding medicines.   Disposition:   FU with me in 3 months      Mertie Moores, MD  01/09/2017 2:25 PM    [facility] [place], [place], [place]  [address] Phone: [phone]; Fax: [phone]

## 2017-01-15 ENCOUNTER — Ambulatory Visit (INDEPENDENT_AMBULATORY_CARE_PROVIDER_SITE_OTHER): Payer: Medicare Other

## 2017-01-15 ENCOUNTER — Other Ambulatory Visit: Payer: Self-pay | Admitting: Internal Medicine

## 2017-01-15 DIAGNOSIS — J4520 Mild intermittent asthma, uncomplicated: Secondary | ICD-10-CM

## 2017-01-16 DIAGNOSIS — J4520 Mild intermittent asthma, uncomplicated: Secondary | ICD-10-CM

## 2017-01-16 MED ORDER — OMALIZUMAB 150 MG ~~LOC~~ SOLR
300.0000 mg | SUBCUTANEOUS | Status: DC
  Administered 2017-01-16: 300 mg via SUBCUTANEOUS

## 2017-01-16 NOTE — Progress Notes (Signed)
Documentation of medication administration and charges of Xolair have been completed by Amrit Cress, CMA based on the Xolair documentation sheet completed by Tammy Scott.  

## 2017-01-17 DIAGNOSIS — E291 Testicular hypofunction: Secondary | ICD-10-CM | POA: Diagnosis not present

## 2017-01-20 ENCOUNTER — Telehealth: Payer: Self-pay | Admitting: Pulmonary Disease

## 2017-01-20 NOTE — Telephone Encounter (Addendum)
#   vials:4 Ordered date:01/20/17 Shipping Date:01/20/17

## 2017-01-21 NOTE — Telephone Encounter (Addendum)
#   Vials:6 typo 4 Arrival Date:01/21/17 Lot #:[id number] Exp Date:11/21

## 2017-01-27 ENCOUNTER — Ambulatory Visit (INDEPENDENT_AMBULATORY_CARE_PROVIDER_SITE_OTHER): Payer: Medicare Other | Admitting: *Deleted

## 2017-01-27 DIAGNOSIS — I5022 Chronic systolic (congestive) heart failure: Secondary | ICD-10-CM

## 2017-01-27 DIAGNOSIS — I255 Ischemic cardiomyopathy: Secondary | ICD-10-CM

## 2017-01-27 DIAGNOSIS — Z95810 Presence of automatic (implantable) cardiac defibrillator: Secondary | ICD-10-CM

## 2017-01-27 NOTE — Progress Notes (Signed)
EPIC Encounter for ICM Monitoring  Patient Name: Shane [name] is a 83 y.o. male Date: 01/27/2017 Primary Care Physican: [name], MD Primary Cardiologist:Nahser/Bensimhon Electrophysiologist: Caryl Comes Nephrologist: Hollie Salk at [facility] Weight: 214 lbs Bi-V Pacing: 96%   Heart Failure questions reviewed, pt symptomatic with weight gain.   Thoracic impedance almost back to baseline today but was abnormal suggesting fluid accumulation.  Prescribed dosage: Torsemide 40 mg 1 tablet daily.  Metolazone 5 mg 1 tablet for weight gain > 214 lbs  Labs: 01/08/2017 Creatinine 2.98, BUN 82, Potassium 3.0, Sodium 136, EGFR 18-21 12/10/2016 Creatinine 2.66, BUN 70, Potassium 3.5, Sodium 138, EGFR 21-24 12/03/2016 Creatinine 2.70, BUN 72, Potassium 3.5, Sodium 138, EGFR 21-24 11/04/2016 Creatinine 3.27, BUN 79, Potassium 3.9, Sodium 137, EGFR 16-19 10/18/2016 Creatinine 2.7, BUN 63. Potassium 3.6, Sodium 138 10/02/2016 Creatinine 2.43, BUN 66, Potassium 3.6, Sodium 139, EGFR 24-28 11/27/2017Creatinine 3.09, BUN 74, Potassium 4.3, Sodium 136 06/07/2016 Creatinine 2.59, BUN 65, Potassium 3.6, Sodium 139, EGFR 22-25  05/01/2016 Creatinine 2.64, BUN 48, Potassium 3.8, Sodium 137  04/24/2016 Creatinine 2.46, BUN 38, Potassium 3.9, Sodium 138, EGFR 23-27  03/26/2016 Creatinine 2.28, BUN 35, Potassium 4.2, Sodium 140  03/07/2016 Creatinine 2.47, BUN 45, Potassium 4.1, Sodium 137 02/15/2016 Creatinine 2.72, BUN 55, Potassium 3.9, Sodium 135  02/05/2016 Creatinine 2.43, BUN 56, Potassium 4.5, Sodium 138  01/31/2016 Creatinine 3.36, BUN 77, Potassium 4.5, Sodium 135  11/16/2015 Creatinine 1.93, BUN 37, Potassium 4.3, Sodium 137  11/05/2016 Creatinine 2.21, BUN 53, Potassium 5.0, Sodium 138*  Recommendations:  Patient took Metolazone today as prescribed for weight gain and already feeling better.  Encouraged to call for fluid symptoms.  Follow-up plan: ICM clinic phone appointment on  02/27/2017.    Copy of ICM check sent to device physician.   3 month ICM trend: 01/27/2017   1 Year ICM trend:      Rosalene Billings, RN 01/27/2017 1:37 PM

## 2017-01-27 NOTE — Progress Notes (Signed)
Carelink Summary Report / Loop Recorder 

## 2017-01-29 ENCOUNTER — Encounter: Payer: Self-pay | Admitting: Cardiology

## 2017-01-29 ENCOUNTER — Ambulatory Visit (INDEPENDENT_AMBULATORY_CARE_PROVIDER_SITE_OTHER): Payer: Medicare Other

## 2017-01-29 DIAGNOSIS — J4540 Moderate persistent asthma, uncomplicated: Secondary | ICD-10-CM

## 2017-01-29 LAB — CUP PACEART REMOTE DEVICE CHECK
Brady Statistic AP VP Percent: 0.71 %
Brady Statistic AP VS Percent: 0.03 %
Brady Statistic AS VP Percent: 97.37 %
Brady Statistic AS VS Percent: 1.9 %
Date Time Interrogation Session: 20180625071704
HighPow Impedance: 60 Ohm
Implantable Lead Implant Date: 20160308
Implantable Lead Location: 753859
Implantable Lead Location: 753860
Implantable Pulse Generator Implant Date: 20160308
Lead Channel Impedance Value: 342 Ohm
Lead Channel Impedance Value: 380 Ohm
Lead Channel Impedance Value: 399 Ohm
Lead Channel Impedance Value: 608 Ohm
Lead Channel Impedance Value: 703 Ohm
Lead Channel Impedance Value: 760 Ohm
Lead Channel Impedance Value: 760 Ohm
Lead Channel Pacing Threshold Amplitude: 0.625 V
Lead Channel Pacing Threshold Amplitude: 1 V
Lead Channel Pacing Threshold Pulse Width: 0.4 ms
Lead Channel Pacing Threshold Pulse Width: 0.4 ms
Lead Channel Pacing Threshold Pulse Width: 0.6 ms
Lead Channel Sensing Intrinsic Amplitude: 2.625 mV
Lead Channel Setting Pacing Amplitude: 2 V
Lead Channel Setting Pacing Amplitude: 2.5 V
Lead Channel Setting Sensing Sensitivity: 0.3 mV
MDC IDC LEAD IMPLANT DT: 20160308
MDC IDC LEAD IMPLANT DT: 20160308
MDC IDC LEAD LOCATION: 753858
MDC IDC MSMT BATTERY REMAINING LONGEVITY: 68 mo
MDC IDC MSMT BATTERY VOLTAGE: 2.97 V
MDC IDC MSMT LEADCHNL LV IMPEDANCE VALUE: 323 Ohm
MDC IDC MSMT LEADCHNL LV IMPEDANCE VALUE: 494 Ohm
MDC IDC MSMT LEADCHNL LV IMPEDANCE VALUE: 494 Ohm
MDC IDC MSMT LEADCHNL LV IMPEDANCE VALUE: 570 Ohm
MDC IDC MSMT LEADCHNL RA IMPEDANCE VALUE: 399 Ohm
MDC IDC MSMT LEADCHNL RA SENSING INTR AMPL: 2.625 mV
MDC IDC MSMT LEADCHNL RV IMPEDANCE VALUE: 399 Ohm
MDC IDC MSMT LEADCHNL RV PACING THRESHOLD AMPLITUDE: 2.375 V
MDC IDC MSMT LEADCHNL RV SENSING INTR AMPL: 9.5 mV
MDC IDC MSMT LEADCHNL RV SENSING INTR AMPL: 9.5 mV
MDC IDC SET LEADCHNL LV PACING AMPLITUDE: 2 V
MDC IDC SET LEADCHNL LV PACING PULSEWIDTH: 0.6 ms
MDC IDC SET LEADCHNL RV PACING PULSEWIDTH: 0.8 ms
MDC IDC STAT BRADY RA PERCENT PACED: 0.73 %
MDC IDC STAT BRADY RV PERCENT PACED: 0.15 %

## 2017-01-30 MED ORDER — OMALIZUMAB 150 MG ~~LOC~~ SOLR
300.0000 mg | SUBCUTANEOUS | Status: DC
  Administered 2017-01-29: 300 mg via SUBCUTANEOUS

## 2017-01-30 NOTE — Progress Notes (Signed)
Xolair injection documentation and charges entered by Ashley Caulfield, RMA, based on injection sheet filled out by Tammy Scott per office protocol.   

## 2017-02-07 DIAGNOSIS — E291 Testicular hypofunction: Secondary | ICD-10-CM | POA: Diagnosis not present

## 2017-02-10 ENCOUNTER — Ambulatory Visit: Payer: Medicare Other

## 2017-02-10 ENCOUNTER — Encounter: Payer: Self-pay | Admitting: Family Medicine

## 2017-02-10 ENCOUNTER — Ambulatory Visit (INDEPENDENT_AMBULATORY_CARE_PROVIDER_SITE_OTHER): Payer: Medicare Other | Admitting: Family Medicine

## 2017-02-10 VITALS — BP 144/82 | HR 77 | Temp 98.5°F | Resp 20 | Ht 70.0 in | Wt 215.0 lb

## 2017-02-10 DIAGNOSIS — Z0000 Encounter for general adult medical examination without abnormal findings: Secondary | ICD-10-CM

## 2017-02-10 DIAGNOSIS — I255 Ischemic cardiomyopathy: Secondary | ICD-10-CM

## 2017-02-10 DIAGNOSIS — J4541 Moderate persistent asthma with (acute) exacerbation: Secondary | ICD-10-CM

## 2017-02-10 MED ORDER — IPRATROPIUM-ALBUTEROL 0.5-2.5 (3) MG/3ML IN SOLN
3.0000 mL | Freq: Once | RESPIRATORY_TRACT | Status: AC
  Administered 2017-02-10: 3 mL via RESPIRATORY_TRACT

## 2017-02-10 MED ORDER — BENZONATATE 100 MG PO CAPS: 100.0000 mg | ORAL_CAPSULE | Freq: Three times a day (TID) | ORAL | 0 refills | Status: DC | PRN

## 2017-02-10 MED ORDER — AMOXICILLIN 875 MG PO TABS: 875.0000 mg | ORAL_TABLET | Freq: Two times a day (BID) | ORAL | 0 refills | Status: DC

## 2017-02-10 MED ORDER — PREDNISONE 20 MG PO TABS: 20.0000 mg | ORAL_TABLET | Freq: Every day | ORAL | 0 refills | Status: DC

## 2017-02-10 MED ORDER — ALBUTEROL SULFATE HFA 108 (90 BASE) MCG/ACT IN AERS: 2.0000 | INHALATION_SPRAY | RESPIRATORY_TRACT | 1 refills | Status: DC | PRN

## 2017-02-10 NOTE — Patient Instructions (Addendum)
Please use your albuterol inhaler every 4-6 hours for the next two days then can use as needed  If your symptoms worse, please go to the ER or call 911  Continue mucinex   Continue doing brain stimulating activities (puzzles, reading, adult coloring books, staying active) to keep memory sharp.   Continue to eat heart healthy diet (full of fruits, vegetables, whole grains, lean protein, water--limit salt, fat, and sugar intake) and increase physical activity as tolerated.   Shane [name] , Thank you for taking time to come for your Medicare Wellness Visit. I appreciate your ongoing commitment to your health goals. Please review the following plan we discussed and let me know if I can assist you in the future.   These are the goals we discussed: Goals    . Stay independent and active          Follow-up with doctors, continue to exercise eat healthy, enjoy life, family and worship God       This is a list of the screening recommended for you and due dates:  Health Maintenance  Topic Date Due  . Complete foot exam   08/05/1945  . Urine Protein Check  08/05/1945  . Tetanus Vaccine  08/05/1954  . Eye exam for diabetics  11/09/2013  . Pneumonia vaccines (2 of 2 - PPSV23) 07/16/2016  . Flu Shot  03/05/2017   Insomnia Insomnia is a sleep disorder that makes it difficult to fall asleep or to stay asleep. Insomnia can cause tiredness (fatigue), low energy, difficulty concentrating, mood swings, and poor performance at work or school. There are three different ways to classify insomnia:  Difficulty falling asleep.  Difficulty staying asleep.  Waking up too early in the morning.  Any type of insomnia can be long-term (chronic) or short-term (acute). Both are common. Short-term insomnia usually lasts for three months or less. Chronic insomnia occurs at least three times a week for longer than three months. What are the causes? Insomnia may be caused by another condition, situation, or  substance, such as:  Anxiety.  Certain medicines.  Gastroesophageal reflux disease (GERD) or other gastrointestinal conditions.  Asthma or other breathing conditions.  Restless legs syndrome, sleep apnea, or other sleep disorders.  Chronic pain.  Menopause. This may include hot flashes.  Stroke.  Abuse of alcohol, tobacco, or illegal drugs.  Depression.  Caffeine.  Neurological disorders, such as Alzheimer disease.  An overactive thyroid (hyperthyroidism).  The cause of insomnia may not be known. What increases the risk? Risk factors for insomnia include:  Gender. Women are more commonly affected than men.  Age. Insomnia is more common as you get older.  Stress. This may involve your professional or personal life.  Income. Insomnia is more common in people with lower income.  Lack of exercise.  Irregular work schedule or night shifts.  Traveling between different time zones.  What are the signs or symptoms? If you have insomnia, trouble falling asleep or trouble staying asleep is the main symptom. This may lead to other symptoms, such as:  Feeling fatigued.  Feeling nervous about going to sleep.  Not feeling rested in the morning.  Having trouble concentrating.  Feeling irritable, anxious, or depressed.  How is this treated? Treatment for insomnia depends on the cause. If your insomnia is caused by an underlying condition, treatment will focus on addressing the condition. Treatment may also include:  Medicines to help you sleep.  Counseling or therapy.  Lifestyle adjustments.  Follow these instructions at  home:  Take medicines only as directed by your health care provider.  Keep regular sleeping and waking hours. Avoid naps.  Keep a sleep diary to help you and your health care provider figure out what could be causing your insomnia. Include: ? When you sleep. ? When you wake up during the night. ? How well you sleep. ? How rested you feel  the next day. ? Any side effects of medicines you are taking. ? What you eat and drink.  Make your bedroom a comfortable place where it is easy to fall asleep: ? Put up shades or special blackout curtains to block light from outside. ? Use a white noise machine to block noise. ? Keep the temperature cool.  Exercise regularly as directed by your health care provider. Avoid exercising right before bedtime.  Use relaxation techniques to manage stress. Ask your health care provider to suggest some techniques that may work well for you. These may include: ? Breathing exercises. ? Routines to release muscle tension. ? Visualizing peaceful scenes.  Cut back on alcohol, caffeinated beverages, and cigarettes, especially close to bedtime. These can disrupt your sleep.  Do not overeat or eat spicy foods right before bedtime. This can lead to digestive discomfort that can make it hard for you to sleep.  Limit screen use before bedtime. This includes: ? Watching TV. ? Using your smartphone, tablet, and computer.  Stick to a routine. This can help you fall asleep faster. Try to do a quiet activity, brush your teeth, and go to bed at the same time each night.  Get out of bed if you are still awake after 15 minutes of trying to sleep. Keep the lights down, but try reading or doing a quiet activity. When you feel sleepy, go back to bed.  Make sure that you drive carefully. Avoid driving if you feel very sleepy.  Keep all follow-up appointments as directed by your health care provider. This is important. Contact a health care provider if:  You are tired throughout the day or have trouble in your daily routine due to sleepiness.  You continue to have sleep problems or your sleep problems get worse. Get help right away if:  You have serious thoughts about hurting yourself or someone else. This information is not intended to replace advice given to you by your health care provider. Make sure you  discuss any questions you have with your health care provider. Document Released: 07/19/2000 Document Revised: 12/22/2015 Document Reviewed: 04/22/2014 Elsevier Interactive Patient Education  [employer].

## 2017-02-10 NOTE — Progress Notes (Signed)
Subjective:    Patient ID: Shane [name], male    DOB: 1934-01-02, 83 y.o.   MRN: [id number]  HPI This is an 83 yo male who presents today with 3 days of sneezing, coughing, runny nose. No fever, taking tylenol every 12 hours. Feels SOB and is hearing wheezing. Cough productive of thick, green sputum. Sore throat. Difficulty sleeping with cough. Has severe asthma and gets Xolair injections. Wife, granddaughter, great granddaughters who live with him also sick.  He just finished AWV with RN.   Past Medical History:  Diagnosis Date  . Abnormal nuclear cardiac imaging test Nov 2010   moderate area of infarct in the inferior wall with only minimal reversibility and EF of 28%  . AICD (automatic cardioverter/defibrillator) present   . Allergic rhinitis 01-08-13   Uses nebulizer for chronic sinus issues and Mucinex.  . Arthritis    osteoarthritis   . Asthma    Extrinic  . Blood transfusion    ? at time of bypass surgery   . BPH (benign prostatic hyperplasia)   . Cellulitis of arm, left    MSSA  . CHF (congestive heart failure) ([place])   . CKD (chronic kidney disease), stage III    "was told due to meds he takes"-no Renal workup done  . Colon polyps    adenomatous  . Coronary artery disease    remote CABG in 1985, cath in 2003 by Dr. Lia Foyer with no PCI, last nuclear in 2010 showing scar and EF of 28%.   . Diabetes mellitus   . Dyslipidemia   . Glaucoma 01-08-13   tx. eye drops  . HOH (hard of hearing)   . HTN (hypertension)   . Hypercholesterolemia   . Left ventricular dysfunction    28% per nuclear in 2010 and 35 to 40% per echo in 2010  . Myocardial infarction ([place])    1985  . Neuromuscular disorder ([place])    legs mild paralysis-able to walk"nerve damage"-legs- left leg brace   . Neuropathy   . Pneumonia    hx of several times years ago   . Spinal stenosis    Past Surgical History:  Procedure Laterality Date  . A/V SHUNTOGRAM Right 10/17/2016   Procedure: A/V Fistulagram;   Surgeon: Conrad Philippi, MD;  Location: [facility];  Service: Cardiovascular;  Laterality: Right;  . BACK SURGERY     hx of back surgery x 4   . BASCILIC VEIN TRANSPOSITION Right 09/09/2016   Procedure: BASCILIC VEIN TRANSPOSITION Right Arm;  Surgeon: Rosetta Posner, MD;  Location: [facility] OR;  Service: Vascular;  Laterality: Right;  . BI-VENTRICULAR IMPLANTABLE CARDIOVERTER DEFIBRILLATOR N/A 10/10/2014   Procedure: BI-VENTRICULAR IMPLANTABLE CARDIOVERTER DEFIBRILLATOR  (CRT-D);  Surgeon: [name], MD;  Location: [facility] CATH LAB;  Service: Cardiovascular;  Laterality: N/A;  . CARDIAC CATHETERIZATION  2003  . Cataract      recent cataract surgery 6'14  . COLONOSCOPY N/A 01/25/2013   Procedure: COLONOSCOPY;  Surgeon: Irene Shipper, MD;  Location: [facility];  Service: Endoscopy;  Laterality: N/A;  . CORONARY ARTERY BYPASS GRAFT  1985   x 5 vessels  . LUMBAR DISC SURGERY    . PERIPHERAL VASCULAR BALLOON ANGIOPLASTY Right 10/17/2016   Procedure: Peripheral Vascular Balloon Angioplasty;  Surgeon: Conrad Woodland, MD;  Location: [facility];  Service: Cardiovascular;  Laterality: Right;  ARM VEINOUS AND CENTRAL VEIN  . PR POLYSOM 6/>YRS SLEEP 4/> ADDL PARAM ATTND  12/07/2015  .  PROSTATE SURGERY    . TOTAL KNEE ARTHROPLASTY  08/01/2011   Procedure: TOTAL KNEE ARTHROPLASTY;  Surgeon: Johnn Hai;  Location: [facility] ORS;  Service: Orthopedics;  Laterality: Right;   Family History  Problem Relation Age of Onset  . Heart attack Father   . Heart disease Father   . Colon polyps Father   . Lung cancer Mother   . Diabetes Sister        x 2  . Heart disease Brother        x 2  . Bone cancer Brother   . Lung disease Neg Hx    Social History  Substance Use Topics  . Smoking status: Former Smoker    Packs/day: 1.00    Years: 5.00    Types: Cigarettes, Pipe, Cigars    Quit date: 08/05/1958  . Smokeless tobacco: Never Used  . Alcohol use No      Review of Systems Per HPI    Objective:    Physical Exam  Constitutional: He is oriented to person, place, and time. He appears well-developed and well-nourished. No distress.  HENT:  Head: Normocephalic and atraumatic.  Right Ear: Tympanic membrane, external ear and ear canal normal.  Left Ear: Tympanic membrane, external ear and ear canal normal.  Nose: Mucosal edema and rhinorrhea present.  Mouth/Throat: Oropharynx is clear and moist.  Eyes: Conjunctivae are normal.  Eyes watery.    Neck: Normal range of motion. Neck supple.  Cardiovascular: Normal rate, regular rhythm and normal heart sounds.   Pulmonary/Chest: Effort normal and breath sounds normal. No respiratory distress. He has no wheezes. He has no rales.  Very tight cough.   Lymphadenopathy:    He has no cervical adenopathy.  Neurological: He is alert and oriented to person, place, and time.  Skin: Skin is warm. He is not diaphoretic.  Psychiatric: He has a normal mood and affect. Judgment and thought content normal.  Vitals reviewed.      BP (!) 144/82   Pulse 77   Temp 98.5 F (36.9 C)   Resp 20   Ht 5\' 10"  (1.778 m)   Wt 215 lb (97.5 kg)   SpO2 97%   BMI 30.85 kg/m  Wt Readings from Last 3 Encounters:  02/10/17 215 lb (97.5 kg)  01/09/17 216 lb 6 oz (98.1 kg)  01/08/17 218 lb 4 oz (99 kg)  Given duoneb in office, PO 98%, subjective improvement in breathing.    Assessment & Plan:  1. Moderate persistent asthmatic bronchitis with acute exacerbation - Provided written and verbal information regarding diagnosis and treatment. - RTC/ER precautions reviewed - ipratropium-albuterol (DUONEB) 0.5-2.5 (3) MG/3ML nebulizer solution 3 mL; Take 3 mLs by nebulization once. - predniSONE (DELTASONE) 20 MG tablet; Take 1 tablet (20 mg total) by mouth daily with breakfast.  Dispense: 5 tablet; Refill: 0 - amoxicillin (AMOXIL) 875 MG tablet; Take 1 tablet (875 mg total) by mouth 2 (two) times daily.  Dispense: 14 tablet; Refill: 0 - albuterol (PROVENTIL HFA;VENTOLIN  HFA) 108 (90 Base) MCG/ACT inhaler; Inhale 2 puffs into the lungs every 4 (four) hours as needed for wheezing or shortness of breath (cough, shortness of breath or wheezing.).  Dispense: 1 Inhaler; Refill: 1 - benzonatate (TESSALON) 100 MG capsule; Take 1 capsule (100 mg total) by mouth 3 (three) times daily as needed for cough.  Dispense: 20 capsule; Refill: 0   Clarene Reamer, FNP-BC  [facility] [place] [place]  02/10/2017 4:55 PM

## 2017-02-10 NOTE — Progress Notes (Addendum)
Subjective:   Shane [name] is a 83 y.o. male who presents for an Initial Medicare Annual Wellness Visit.  Review of Systems  No ROS.  Medicare Wellness Visit. Additional risk factors are reflected in the social history.  Cardiac Risk Factors include: advanced age (>57men, >79 women) Sleep patterns: has daytime sleepiness, gets up 2-3 times nightly to void and sleeps 5 hours nightly.  Patient reports insomnia issues, discussed recommended sleep tips and stress reduction tips, education was attached to patient's AVS.  Home Safety/Smoke Alarms: Feels safe in home. Smoke alarms in place.  Living environment; residence and Firearm Safety: 2-story house, no firearms. Lives with family, no needs for DME, good support system  Seat Belt Safety/Bike Helmet: Wears seat belt.   Counseling:   Eye Exam- appointment yearly Dental- appointment every 6 months  Male:   CCS- N/A    PSA- No results found for: PSA     Objective:    Today's Vitals   02/10/17 1518  BP: (!) 144/82  Pulse: 77  Resp: 20  Temp: 98.5 F (36.9 C)  SpO2: 97%  Weight: 215 lb (97.5 kg)  Height: 5\' 10"  (1.778 m)   Body mass index is 30.85 kg/m.  Current Medications (verified) Outpatient Encounter Prescriptions as of 02/10/2017  Medication Sig  . acetaminophen (TYLENOL) 500 MG tablet Take 1,000 mg by mouth every 8 (eight) hours as needed (for pain.).   Marland Kitchen amLODipine (NORVASC) 10 MG tablet Take 1 tablet (10 mg total) by mouth daily.  Marland Kitchen aspirin EC 81 MG tablet Take 81 mg by mouth daily.   Marland Kitchen atorvastatin (LIPITOR) 10 MG tablet Take 1 tablet (10 mg total) by mouth daily.  . carvedilol (COREG) 25 MG tablet Take 1 tablet (25 mg total) by mouth 2 (two) times daily.  . cetirizine (ZYRTEC) 10 MG tablet Take 10 mg by mouth at bedtime as needed for allergies.  . CIALIS 20 MG tablet Take 20 mg by mouth daily as needed for erectile dysfunction. DO NOT EXCEED 2 DOSES WITHIN 7 DAYS  . diphenhydrAMINE (BENADRYL) 25 mg capsule  Take 25 mg by mouth every 6 (six) hours as needed for allergies.   . fluocinonide cream (LIDEX) 6.31 % Apply 1 application topically 2 (two) times daily as needed (for itchy rash).   . hydrALAZINE (APRESOLINE) 50 MG tablet Take 1 tablet (50 mg total) by mouth 3 (three) times daily.  Marland Kitchen omalizumab (XOLAIR) 150 MG injection Inject 150 mg into the skin every 14 (fourteen) days.  . potassium chloride (MICRO-K) 10 MEQ CR capsule Take 10 mEq by mouth as needed.  . testosterone cypionate (DEPOTESTOTERONE CYPIONATE) 100 MG/ML injection Inject 400 mg into the muscle every 21 ( twenty-one) days. For IM use only  . torsemide (DEMADEX) 20 MG tablet Take 3 tablets (60 mg total) by mouth daily.  Marland Kitchen triamcinolone (NASACORT AQ) 55 MCG/ACT AERO nasal inhaler Place 2 sprays into the nose daily.  Marland Kitchen zolpidem (AMBIEN) 10 MG tablet Take one-half to one tablet by mouth at bedtime as needed for sleep.  . [DISCONTINUED] torsemide (DEMADEX) 20 MG tablet Take 40 mg by mouth.  Marland Kitchen albuterol (PROVENTIL HFA;VENTOLIN HFA) 108 (90 Base) MCG/ACT inhaler Inhale 2 puffs into the lungs every 4 (four) hours as needed for wheezing or shortness of breath (cough, shortness of breath or wheezing.).  Marland Kitchen amoxicillin (AMOXIL) 875 MG tablet Take 1 tablet (875 mg total) by mouth 2 (two) times daily.  . benzonatate (TESSALON) 100 MG capsule Take 1  capsule (100 mg total) by mouth 3 (three) times daily as needed for cough.  . predniSONE (DELTASONE) 20 MG tablet Take 1 tablet (20 mg total) by mouth daily with breakfast.   Facility-Administered Encounter Medications as of 02/10/2017  Medication  . [COMPLETED] ipratropium-albuterol (DUONEB) 0.5-2.5 (3) MG/3ML nebulizer solution 3 mL  . omalizumab Arvid Right) injection 300 mg  . omalizumab Arvid Right) injection 300 mg  . omalizumab Arvid Right) injection 300 mg  . omalizumab Arvid Right) injection 300 mg    Allergies (verified) Codeine; Hydrocodone; Seasonal ic [cholestatin]; and Azithromycin   History: Past  Medical History:  Diagnosis Date  . Abnormal nuclear cardiac imaging test Nov 2010   moderate area of infarct in the inferior wall with only minimal reversibility and EF of 28%  . AICD (automatic cardioverter/defibrillator) present   . Allergic rhinitis 01-08-13   Uses nebulizer for chronic sinus issues and Mucinex.  . Arthritis    osteoarthritis   . Asthma    Extrinic  . Blood transfusion    ? at time of bypass surgery   . BPH (benign prostatic hyperplasia)   . Cellulitis of arm, left    MSSA  . CHF (congestive heart failure) ([place])   . CKD (chronic kidney disease), stage III    "was told due to meds he takes"-no Renal workup done  . Colon polyps    adenomatous  . Coronary artery disease    remote CABG in 1985, cath in 2003 by Dr. Lia Foyer with no PCI, last nuclear in 2010 showing scar and EF of 28%.   . Diabetes mellitus   . Dyslipidemia   . Glaucoma 01-08-13   tx. eye drops  . HOH (hard of hearing)   . HTN (hypertension)   . Hypercholesterolemia   . Left ventricular dysfunction    28% per nuclear in 2010 and 35 to 40% per echo in 2010  . Myocardial infarction ([place])    1985  . Neuromuscular disorder ([place])    legs mild paralysis-able to walk"nerve damage"-legs- left leg brace   . Neuropathy   . Pneumonia    hx of several times years ago   . Spinal stenosis    Past Surgical History:  Procedure Laterality Date  . A/V SHUNTOGRAM Right 10/17/2016   Procedure: A/V Fistulagram;  Surgeon: Conrad Jacona, MD;  Location: [place] [facility];  Service: Cardiovascular;  Laterality: Right;  . BACK SURGERY     hx of back surgery x 4   . BASCILIC VEIN TRANSPOSITION Right 09/09/2016   Procedure: BASCILIC VEIN TRANSPOSITION Right Arm;  Surgeon: Rosetta Posner, MD;  [facility];  Service: Vascular;  Laterality: Right;  . BI-VENTRICULAR IMPLANTABLE CARDIOVERTER DEFIBRILLATOR N/A 10/10/2014   Procedure: BI-VENTRICULAR IMPLANTABLE CARDIOVERTER DEFIBRILLATOR  (CRT-D);  Surgeon: [name], MD;   Location: [facility] C[facility] LAB;  Service: Cardiovascular;  Laterality: N/A;  . CARDIAC CATHETERIZATION  2003  . Cataract      recent cataract surgery 6'14  . COLONOSCOPY N/A 01/25/2013   Procedure: COLONOSCOPY;  Surgeon: Irene Shipper, MD;  Location: [facility];  Service: Endoscopy;  Laterality: N/A;  . CORONARY ARTERY BYPASS GRAFT  1985   x 5 vessels  . LUMBAR DISC SURGERY    . PERIPHERAL VASCULAR BALLOON ANGIOPLASTY Right 10/17/2016   Procedure: Peripheral Vascular Balloon Angioplasty;  Surgeon: Conrad Paris, MD;  Location: [place] [facility];  Service: Cardiovascular;  Laterality: Right;  ARM VEINOUS AND CENTRAL VEIN  . PR POLYSOM 6/>YRS SLEEP 4/>  ADDL PARAM ATTND  12/07/2015  . PROSTATE SURGERY    . TOTAL KNEE ARTHROPLASTY  08/01/2011   Procedure: TOTAL KNEE ARTHROPLASTY;  Surgeon: Johnn Hai;  Location: [facility] ORS;  Service: Orthopedics;  Laterality: Right;   Family History  Problem Relation Age of Onset  . Heart attack Father   . Heart disease Father   . Colon polyps Father   . Lung cancer Mother   . Diabetes Sister        x 2  . Heart disease Brother        x 2  . Bone cancer Brother   . Lung disease Neg Hx    Social History   Occupational History  . pastor    Social History Main Topics  . Smoking status: Former Smoker    Packs/day: 1.00    Years: 5.00    Types: Cigarettes, Pipe, Cigars    Quit date: 08/05/1958  . Smokeless tobacco: Never Used  . Alcohol use No  . Drug use: No  . Sexual activity: Not Currently   Tobacco Counseling Counseling given: Not Answered   Activities of Daily Living In your present state of health, do you have any difficulty performing the following activities: 02/10/2017 10/17/2016  Hearing? Y N  Vision? N N  Difficulty concentrating or making decisions? N N  Walking or climbing stairs? N N  Dressing or bathing? N N  Doing errands, shopping? N -  Preparing Food and eating ? N -  Using the Toilet? N -  In the past six months, have you  accidently leaked urine? N -  Do you have problems with loss of bowel control? N -  Managing your Medications? N -  Managing your Finances? N -  Housekeeping or managing your Housekeeping? N -  Some recent data might be hidden    Immunizations and Health Maintenance Immunization History  Administered Date(s) Administered  . Influenza Split 05/05/2013, 06/22/2015  . Influenza Whole 06/03/2011  . Influenza-Unspecified 06/01/2016  . Pneumococcal Conjugate-13 07/17/2015   Health Maintenance Due  Topic Date Due  . FOOT EXAM  08/05/1945  . URINE MICROALBUMIN  08/05/1945  . TETANUS/TDAP  08/05/1954  . OPHTHALMOLOGY EXAM  11/09/2013  . PNA vac Low Risk Adult (2 of 2 - PPSV23) 07/16/2016    Patient Care Team: [name], MD as PCP - General (Cardiology) Elsie Stain, MD as Consulting Physician (Pulmonary Disease) Madelon Lips, MD as Consulting Physician (Nephrology)  Indicate any recent Medical Services you may have received from other than [facility] providers in the past year (date may be approximate).    Assessment:   This is a routine wellness examination for Shane [name]. Physical assessment deferred to PCP.  Hearing/Vision screen Hearing Screening Comments: HOH, has hearing aides Vision Screening Comments: Wears glasses  Dietary issues and exercise activities discussed: Current Exercise Habits: Home exercise routine, Time (Minutes): 30, Frequency (Times/Week): 3, Weekly Exercise (Minutes/Week): 90, Intensity: Moderate, Exercise limited by: orthopedic condition(s)  Diet (meal preparation, eat out, water intake, caffeinated beverages, dairy products, fruits and vegetables): in general, a "healthy" diet  , well balanced, diabetic, low fat/ cholesterol, low salt Diet education was attached to patient's AVS.  Goals    . Stay independent and active          Follow-up with doctors, continue to exercise eat healthy, enjoy life, family and worship God      Depression  Screen [facility] 2/9 Scores 02/10/2017 06/11/2016 03/18/2011  PHQ - 2 Score  1 0 0  PHQ- 9 Score 4 - -    Fall Risk Fall Risk  02/10/2017 06/11/2016 04/05/2016  Falls in the past year? No No No    Cognitive Function: MMSE - Mini Mental State Exam 02/10/2017  Orientation to time 5  Orientation to Place 5  Registration 3  Attention/ Calculation 4  Recall 3  Language- name 2 objects 2  Language- repeat 1  Language- follow 3 step command 3  Language- read & follow direction 1  Write a sentence 1  Copy design 1  Total score 29        Screening Tests Health Maintenance  Topic Date Due  . FOOT EXAM  08/05/1945  . URINE MICROALBUMIN  08/05/1945  . TETANUS/TDAP  08/05/1954  . OPHTHALMOLOGY EXAM  11/09/2013  . PNA vac Low Risk Adult (2 of 2 - PPSV23) 07/16/2016  . INFLUENZA VACCINE  03/05/2017        Plan:    Continue doing brain stimulating activities (puzzles, reading, adult coloring books, staying active) to keep memory sharp.   Continue to eat heart healthy diet (full of fruits, vegetables, whole grains, lean protein, water--limit salt, fat, and sugar intake) and increase physical activity as tolerated.  I have personally reviewed and noted the following in the patient's chart:   . Medical and social history . Use of alcohol, tobacco or illicit drugs  . Current medications and supplements . Functional ability and status . Nutritional status . Physical activity . Advanced directives . List of other physicians . Vitals . Screenings to include cognitive, depression, and falls . Referrals and appointments  In addition, I have reviewed and discussed with patient certain preventive protocols, quality metrics, and best practice recommendations. A written personalized care plan for preventive services as well as general preventive health recommendations were provided to patient.     Michiel Cowboy, RN   02/10/2017    Medical screening examination/treatment/procedure(s) were performed  by non-physician practitioner and as supervising physician I was immediately available for consultation/collaboration. I agree with above. [name], MD

## 2017-02-10 NOTE — Progress Notes (Signed)
Pre visit review using our clinic review tool, if applicable. No additional management support is needed unless otherwise documented below in the visit note. 

## 2017-02-12 ENCOUNTER — Ambulatory Visit: Payer: Medicare Other

## 2017-02-18 ENCOUNTER — Ambulatory Visit: Payer: Medicare Other | Admitting: Podiatry

## 2017-02-19 ENCOUNTER — Telehealth: Payer: Self-pay | Admitting: Family Medicine

## 2017-02-19 ENCOUNTER — Other Ambulatory Visit (INDEPENDENT_AMBULATORY_CARE_PROVIDER_SITE_OTHER): Payer: Medicare Other

## 2017-02-19 ENCOUNTER — Ambulatory Visit (INDEPENDENT_AMBULATORY_CARE_PROVIDER_SITE_OTHER)
Admission: RE | Admit: 2017-02-19 | Discharge: 2017-02-19 | Disposition: A | Discharge status: Home / Self Care | Payer: Medicare Other | Source: Ambulatory Visit | Attending: Family Medicine | Admitting: Family Medicine

## 2017-02-19 ENCOUNTER — Telehealth: Payer: Self-pay | Admitting: Internal Medicine

## 2017-02-19 ENCOUNTER — Ambulatory Visit (INDEPENDENT_AMBULATORY_CARE_PROVIDER_SITE_OTHER): Payer: Medicare Other | Admitting: Family Medicine

## 2017-02-19 ENCOUNTER — Encounter: Payer: Self-pay | Admitting: Family Medicine

## 2017-02-19 VITALS — BP 130/62 | HR 72 | Temp 98.2°F | Ht 70.0 in | Wt 216.0 lb

## 2017-02-19 DIAGNOSIS — R05 Cough: Secondary | ICD-10-CM | POA: Diagnosis not present

## 2017-02-19 DIAGNOSIS — I255 Ischemic cardiomyopathy: Secondary | ICD-10-CM

## 2017-02-19 DIAGNOSIS — J22 Unspecified acute lower respiratory infection: Secondary | ICD-10-CM

## 2017-02-19 LAB — CBC WITH DIFFERENTIAL/PLATELET
BASOS ABS: 0 10*3/uL (ref 0.0–0.1)
Basophils Relative: 0.3 % (ref 0.0–3.0)
EOS ABS: 0.1 10*3/uL (ref 0.0–0.7)
Eosinophils Relative: 1.2 % (ref 0.0–5.0)
HEMATOCRIT: 41.3 % (ref 39.0–52.0)
Hemoglobin: 13.6 g/dL (ref 13.0–17.0)
LYMPHS PCT: 15.6 % (ref 12.0–46.0)
Lymphs Abs: 1.4 10*3/uL (ref 0.7–4.0)
MCHC: 32.8 g/dL (ref 30.0–36.0)
MCV: 94.7 fl (ref 78.0–100.0)
Monocytes Absolute: 0.8 10*3/uL (ref 0.1–1.0)
Monocytes Relative: 8.8 % (ref 3.0–12.0)
Neutro Abs: 6.6 10*3/uL (ref 1.4–7.7)
Neutrophils Relative %: 74.1 % (ref 43.0–77.0)
PLATELETS: 133 10*3/uL — AB (ref 150.0–400.0)
RBC: 4.36 Mil/uL (ref 4.22–5.81)
RDW: 14.6 % (ref 11.5–15.5)
WBC: 8.9 10*3/uL (ref 4.0–10.5)

## 2017-02-19 MED ORDER — DOXYCYCLINE HYCLATE 100 MG PO CAPS: 100.0000 mg | ORAL_CAPSULE | Freq: Two times a day (BID) | ORAL | 0 refills | Status: DC

## 2017-02-19 MED ORDER — HYDROCODONE-HOMATROPINE 5-1.5 MG/5ML PO SYRP: 5.0000 mL | ORAL_SOLUTION | Freq: Three times a day (TID) | ORAL | 0 refills | Status: DC | PRN

## 2017-02-19 MED ORDER — PREDNISONE 20 MG PO TABS: ORAL_TABLET | ORAL | 0 refills | Status: DC

## 2017-02-19 NOTE — Progress Notes (Signed)
Pre visit review using our clinic review tool, if applicable. No additional management support is needed unless otherwise documented below in the visit note. 

## 2017-02-19 NOTE — Telephone Encounter (Signed)
Error

## 2017-02-19 NOTE — Telephone Encounter (Signed)
Spoke with pharmacy, states that pt has a refill on ambien and does not need Drs Josem Kaufmann. Unable to contact pt to inform bc phones are not dialing out.

## 2017-02-19 NOTE — Patient Instructions (Signed)
Please go down to the basement for chest xray and labs  I have sent additional antibiotic, prednisone to your pharmacy. You will have a printed prescription for your cough syrup.   We will call you tomorrow with results.

## 2017-02-19 NOTE — Telephone Encounter (Signed)
Patient is requesting refill on [place]

## 2017-02-19 NOTE — Progress Notes (Signed)
Subjective:    Patient ID: Shane [name], male    DOB: [birth year], 84 y.o.   MRN: [id number]  HPI This is an 83 yo male who presents today with continued cough. Was seen by me 02/10/17 with moderate persistent asthmatic bronchitis with acute exacerbation, he finished antibiotic and prednisone, tolerated well. Started to feel somewhat better but then improvement did not continue. He continues to have cough and fatigue. SOB improved. Chest feels tight, cough is productive of small amount sputum, sometimes dark, sometimes without color. Using albuterol every 6 hours with temporary relief. No fever, no nasal drainage, no congestion. Little relief with benzonate and otc Delsym, coughing through the night. Wife sick on antibiotic as well.   Past Medical History:  Diagnosis Date  . Abnormal nuclear cardiac imaging test Nov 2010   moderate area of infarct in the inferior wall with only minimal reversibility and EF of 28%  . AICD (automatic cardioverter/defibrillator) present   . Allergic rhinitis 01-08-13   Uses nebulizer for chronic sinus issues and Mucinex.  . Arthritis    osteoarthritis   . Asthma    Extrinic  . Blood transfusion    ? at time of bypass surgery   . BPH (benign prostatic hyperplasia)   . Cellulitis of arm, left    MSSA  . CHF (congestive heart failure) ([place])   . CKD (chronic kidney disease), stage III    "was told due to meds he takes"-no Renal workup done  . Colon polyps    adenomatous  . Coronary artery disease    remote CABG in 1985, cath in 2003 by Dr. Lia Foyer with no PCI, last nuclear in 2010 showing scar and EF of 28%.   . Diabetes mellitus   . Dyslipidemia   . Glaucoma 01-08-13   tx. eye drops  . HOH (hard of hearing)   . HTN (hypertension)   . Hypercholesterolemia   . Left ventricular dysfunction    28% per nuclear in 2010 and 35 to 40% per echo in 2010  . Myocardial infarction ([place])    1985  . Neuromuscular disorder ([place])    legs mild paralysis-able to  walk"nerve damage"-legs- left leg brace   . Neuropathy   . Pneumonia    hx of several times years ago   . Spinal stenosis    Past Surgical History:  Procedure Laterality Date  . A/V SHUNTOGRAM Right 10/17/2016   Procedure: A/V Fistulagram;  Surgeon: Conrad [place], MD;  Location: [place] [facility];  Service: Cardiovascular;  Laterality: Right;  . BACK SURGERY     hx of back surgery x 4   . BASCILIC VEIN TRANSPOSITION Right 09/09/2016   Procedure: BASCILIC VEIN TRANSPOSITION Right Arm;  Surgeon: Rosetta Posner, MD;  Location: [facility] OR;  Service: Vascular;  Laterality: Right;  . BI-VENTRICULAR IMPLANTABLE CARDIOVERTER DEFIBRILLATOR N/A 10/10/2014   Procedure: BI-VENTRICULAR IMPLANTABLE CARDIOVERTER DEFIBRILLATOR  (CRT-D);  Surgeon: [name], MD;  Location: [facility] CATH LAB;  Service: Cardiovascular;  Laterality: N/A;  . CARDIAC CATHETERIZATION  2003  . Cataract      recent cataract surgery 6'14  . COLONOSCOPY N/A 01/25/2013   Procedure: COLONOSCOPY;  Surgeon: Irene Shipper, MD;  [facility];  Service: Endoscopy;  Laterality: N/A;  . CORONARY ARTERY BYPASS GRAFT  1985   x 5 vessels  . LUMBAR DISC SURGERY    . PERIPHERAL VASCULAR BALLOON ANGIOPLASTY Right 10/17/2016   Procedure: Peripheral Vascular Balloon Angioplasty;  Surgeon: Jannette Fogo  Bridgett Larsson, MD;  Location: [place] [facility];  Service: Cardiovascular;  Laterality: Right;  ARM VEINOUS AND CENTRAL VEIN  . PR POLYSOM 6/>YRS SLEEP 4/> ADDL PARAM ATTND  12/07/2015  . PROSTATE SURGERY    . TOTAL KNEE ARTHROPLASTY  08/01/2011   Procedure: TOTAL KNEE ARTHROPLASTY;  Surgeon: Johnn Hai;  [facility];  Service: Orthopedics;  Laterality: Right;   Family History  Problem Relation Age of Onset  . Heart attack Father   . Heart disease Father   . Colon polyps Father   . Lung cancer Mother   . Diabetes Sister        x 2  . Heart disease Brother        x 2  . Bone cancer Brother   . Lung disease Neg Hx    Social History  Substance Use  Topics  . Smoking status: Former Smoker    Packs/day: 1.00    Years: 5.00    Types: Cigarettes, Pipe, Cigars    Quit date: 08/05/1958  . Smokeless tobacco: Never Used  . Alcohol use No     Review of Systems Per HPI    Objective:   Physical Exam  Constitutional: He is oriented to person, place, and time. He appears well-developed and well-nourished. No distress.  Elderly male in NAD, appears to not feel well, but looks better than previous visit.   HENT:  Head: Normocephalic and atraumatic.  Mouth/Throat: Oropharynx is clear and moist.  Eyes: Conjunctivae are normal.  Neck: Normal range of motion. Neck supple.  Cardiovascular: Normal rate, regular rhythm and normal heart sounds.   Pulmonary/Chest: Effort normal. He has wheezes (fine posterior expiratory in bases).  Occasional tight, dry cough.   Lymphadenopathy:    He has no cervical adenopathy.  Neurological: He is alert and oriented to person, place, and time.  Skin: Skin is warm and dry. He is not diaphoretic.  Vitals reviewed.    BP 130/62 (BP Location: Left Arm, Patient Position: Sitting, Cuff Size: Normal)   Pulse 72   Temp 98.2 F (36.8 C) (Oral)   Ht 5\' 10"  (1.778 m)   Wt 216 lb (98 kg)   SpO2 98%   BMI 30.99 kg/m  Wt Readings from Last 3 Encounters:  02/19/17 216 lb (98 kg)  02/10/17 215 lb (97.5 kg)  01/09/17 216 lb 6 oz (98.1 kg)        Assessment & Plan:  1. Lower respiratory infection - concern for pneumonia given treatment and failure to significantly improve - RTC precautions reviewed; instructed him to notify office if not better in 48 hours - CBC with Differential/Platelet; Future - DG Chest 2 View; Future - doxycycline (VIBRAMYCIN) 100 MG capsule; Take 1 capsule (100 mg total) by mouth 2 (two) times daily.  Dispense: 14 capsule; Refill: 0 - HYDROcodone-homatropine (HYCODAN) 5-1.5 MG/5ML syrup; Take 5 mLs by mouth every 8 (eight) hours as needed for cough.  Dispense: 120 mL; Refill: 0 -  predniSONE (DELTASONE) 20 MG tablet; Take 3 x 3 days, 2 x 3 days, 1 x 3 days  Dispense: 18 tablet; Refill: 0   Clarene Reamer, FNP-BC  [facility] [place] [place]  02/19/2017 5:27 PM

## 2017-02-27 ENCOUNTER — Ambulatory Visit (INDEPENDENT_AMBULATORY_CARE_PROVIDER_SITE_OTHER): Payer: Medicare Other

## 2017-02-27 DIAGNOSIS — I5022 Chronic systolic (congestive) heart failure: Secondary | ICD-10-CM | POA: Diagnosis not present

## 2017-02-27 DIAGNOSIS — Z95810 Presence of automatic (implantable) cardiac defibrillator: Secondary | ICD-10-CM

## 2017-02-27 NOTE — Progress Notes (Signed)
EPIC Encounter for ICM Monitoring  Patient Name: Shane [name] is a 83 y.o. male Date: 02/27/2017 Primary Care Physican: [name], MD Primary Cardiologist:Nahser/Bensimhon Electrophysiologist: Caryl Comes Nephrologist: Hollie Salk at [facility] Weight: 214 lbs Bi-V Pacing: 94%     Heart Failure questions reviewed, pt asymptomatic.   Thoracic impedance normal.  Prescribed dosage: Torsemide 40 mg 1 tablet daily. Metolazone 5 mg 1 tablet for weight gain > 214 lbs.  He reported taking Metolazone about once a week for weight gain.   Labs: 01/08/2017 Creatinine 2.98, BUN 82, Potassium 3.0, Sodium 136, EGFR 18-21 12/10/2016 Creatinine 2.66, BUN 70, Potassium 3.5, Sodium 138, EGFR 21-24 12/03/2016 Creatinine 2.70, BUN 72, Potassium 3.5, Sodium 138, EGFR 21-24 11/04/2016 Creatinine 3.27, BUN 79, Potassium 3.9, Sodium 137, EGFR 16-19 10/18/2016 Creatinine 2.7, BUN 63. Potassium 3.6, Sodium 138 10/02/2016 Creatinine 2.43, BUN 66, Potassium 3.6, Sodium 139, EGFR 24-28 11/27/2017Creatinine 3.09, BUN 74, Potassium 4.3, Sodium 136 06/07/2016 Creatinine 2.59, BUN 65, Potassium 3.6, Sodium 139, EGFR 22-25  05/01/2016 Creatinine 2.64, BUN 48, Potassium 3.8, Sodium 137  04/24/2016 Creatinine 2.46, BUN 38, Potassium 3.9, Sodium 138, EGFR 23-27  03/26/2016 Creatinine 2.28, BUN 35, Potassium 4.2, Sodium 140   Recommendations: No changes.   Encouraged to call for fluid symptoms.  Follow-up plan: ICM clinic phone appointment on 03/31/2017.  Office appointment scheduled 04/28/2017 with Dr. Acie Fredrickson.  Copy of ICM check sent to device physician.   3 month ICM trend: 02/27/2017   1 Year ICM trend:      Rosalene Billings, RN 02/27/2017 1:07 PM

## 2017-03-04 ENCOUNTER — Other Ambulatory Visit (INDEPENDENT_AMBULATORY_CARE_PROVIDER_SITE_OTHER): Payer: Medicare Other

## 2017-03-04 ENCOUNTER — Ambulatory Visit (INDEPENDENT_AMBULATORY_CARE_PROVIDER_SITE_OTHER): Payer: Medicare Other | Admitting: Internal Medicine

## 2017-03-04 ENCOUNTER — Encounter: Payer: Self-pay | Admitting: Internal Medicine

## 2017-03-04 VITALS — BP 138/66 | HR 73 | Temp 98.1°F | Resp 16 | Wt 215.0 lb

## 2017-03-04 DIAGNOSIS — I255 Ischemic cardiomyopathy: Secondary | ICD-10-CM

## 2017-03-04 DIAGNOSIS — R202 Paresthesia of skin: Secondary | ICD-10-CM

## 2017-03-04 DIAGNOSIS — G6289 Other specified polyneuropathies: Secondary | ICD-10-CM | POA: Diagnosis not present

## 2017-03-04 DIAGNOSIS — E1122 Type 2 diabetes mellitus with diabetic chronic kidney disease: Secondary | ICD-10-CM

## 2017-03-04 DIAGNOSIS — G629 Polyneuropathy, unspecified: Secondary | ICD-10-CM | POA: Insufficient documentation

## 2017-03-04 DIAGNOSIS — I10 Essential (primary) hypertension: Secondary | ICD-10-CM

## 2017-03-04 DIAGNOSIS — R200 Anesthesia of skin: Secondary | ICD-10-CM | POA: Diagnosis not present

## 2017-03-04 LAB — CBC WITH DIFFERENTIAL/PLATELET
BASOS ABS: 0 10*3/uL (ref 0.0–0.1)
BASOS PCT: 0.4 % (ref 0.0–3.0)
Eosinophils Absolute: 0.1 10*3/uL (ref 0.0–0.7)
Eosinophils Relative: 1.1 % (ref 0.0–5.0)
HEMATOCRIT: 41 % (ref 39.0–52.0)
Hemoglobin: 13.2 g/dL (ref 13.0–17.0)
Lymphocytes Relative: 14.6 % (ref 12.0–46.0)
Lymphs Abs: 1 10*3/uL (ref 0.7–4.0)
MCHC: 32.2 g/dL (ref 30.0–36.0)
MCV: 95.6 fl (ref 78.0–100.0)
MONOS PCT: 8.9 % (ref 3.0–12.0)
Monocytes Absolute: 0.6 10*3/uL (ref 0.1–1.0)
NEUTROS ABS: 4.9 10*3/uL (ref 1.4–7.7)
Neutrophils Relative %: 75 % (ref 43.0–77.0)
PLATELETS: 65 10*3/uL — AB (ref 150.0–400.0)
RBC: 4.29 Mil/uL (ref 4.22–5.81)
RDW: 14.2 % (ref 11.5–15.5)
WBC: 6.5 10*3/uL (ref 4.0–10.5)

## 2017-03-04 LAB — MICROALBUMIN / CREATININE URINE RATIO
CREATININE, U: 79.6 mg/dL
Microalb Creat Ratio: 54.5 mg/g — ABNORMAL HIGH (ref 0.0–30.0)
Microalb, Ur: 43.4 mg/dL — ABNORMAL HIGH (ref 0.0–1.9)

## 2017-03-04 LAB — COMPREHENSIVE METABOLIC PANEL
ALBUMIN: 3.7 g/dL (ref 3.5–5.2)
ALT: 21 U/L (ref 0–53)
AST: 26 U/L (ref 0–37)
Alkaline Phosphatase: 38 U/L — ABNORMAL LOW (ref 39–117)
BUN: 67 mg/dL — ABNORMAL HIGH (ref 6–23)
CALCIUM: 9.1 mg/dL (ref 8.4–10.5)
CHLORIDE: 94 meq/L — AB (ref 96–112)
CO2: 35 mEq/L — ABNORMAL HIGH (ref 19–32)
CREATININE: 2.39 mg/dL — AB (ref 0.40–1.50)
GFR: 27.85 mL/min — AB (ref >60.00)
Glucose, Bld: 223 mg/dL — ABNORMAL HIGH (ref 70–99)
POTASSIUM: 3.4 meq/L — AB (ref 3.5–5.1)
Sodium: 137 mEq/L (ref 135–145)
Total Bilirubin: 1.4 mg/dL — ABNORMAL HIGH (ref 0.2–1.2)
Total Protein: 6.6 g/dL (ref 6.0–8.3)

## 2017-03-04 LAB — HEMOGLOBIN A1C: HEMOGLOBIN A1C: 7 % — AB (ref 4.6–6.5)

## 2017-03-04 LAB — VITAMIN B12: VITAMIN B 12: 714 pg/mL (ref 211–911)

## 2017-03-04 LAB — TSH: TSH: 1.58 u[IU]/mL (ref 0.35–4.50)

## 2017-03-04 MED ORDER — GABAPENTIN 100 MG PO CAPS: 100.0000 mg | ORAL_CAPSULE | Freq: Every day | ORAL | 3 refills | Status: DC

## 2017-03-04 NOTE — Patient Instructions (Addendum)
  Test(s) ordered today. Your results will be released to [facility] (or called to you) after review, usually within 72hours after test completion. If any changes need to be made, you will be notified at that same time.  Medications reviewed and updated.  Changes include starting gabapentin 100 mg at night.  This dose can be increased if you tolerate it and it helps.   Your prescription(s) have been submitted to your pharmacy. Please take as directed and contact our office if you believe you are having problem(s) with the medication(s).  A referral was ordered for Dr Jannifer Franklin  Please followup in 6 months

## 2017-03-04 NOTE — Progress Notes (Signed)
Subjective:    Patient ID: Shane [name], male    DOB: Oct 25, 1933, 83 y.o.   MRN: [id number]  HPI He is here for an acute visit.   neuropathy in feet: in 1990 he had a disc shatter in his back and he was paralyzed.  He has chronic lower back pain that has not changed.  He has had 4 back surgeries in the past.  Since 1990 he has had numbness/tingling and discomfort in his feet.  It has gotten worse recently.  He has symptoms up to his mid lower legs.  Increasing exercise improves his symptoms.  Last week a couple of times he had poor balance with standing.  He is most concerned about his walking and his balance.    Diabetes: He is taking his medication daily as prescribed. He is compliant with a diabetic diet. He is exercising regularly - rides his stationary bike 2-3 times a week for 45 minutes.  He checks his feet daily and denies foot lesions.   Hypertension: He is taking his medication daily. He is compliant with a low sodium diet.  He denies chest pain, palpitations, and regular headaches. He is exercising regularly.     Medications and allergies reviewed with patient and updated if appropriate.  Patient Active Problem List   Diagnosis Date Noted  . Carotid artery disease ([place]) 01/09/2017  . Chronic systolic CHF (congestive heart failure) ([place]) 07/01/2016  . Restrictive lung disease 01/22/2016  . Severe obstructive sleep apnea 12/17/2015  . Essential hypertension 11/16/2015  . 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 LBBB (left bundle branch block) 10/10/2014  . Syncope 10/06/2014  . Pain in thumb joint with movement of right hand 01/04/2014  . Sinusitis, chronic 07/16/2013  . Asthma, moderate persistent 07/08/2013  . Chronic infection of sinus 07/31/2012  . Benign prostatic hypertrophy without urinary obstruction 12/18/2011  . CAD s/p coronary arthery bypass graft   . BPH (benign prostatic hyperplasia)   . Chronic kidney disease (CKD), stage IV (severe) ([place])   . H/O Spinal stenosis     . DM (diabetes mellitus), type 2 with renal complications ([place]) 64/68/0321  . Benign hypertensive heart disease without heart failure 11/07/2010  . Allergic rhinitis 11/07/2010  . Osteoarthritis 11/07/2010  . Ischemic cardiomyopathy   . Hypercholesterolemia     Current Outpatient Prescriptions on File Prior to Visit  Medication Sig Dispense Refill  . acetaminophen (TYLENOL) 500 MG tablet Take 1,000 mg by mouth every 8 (eight) hours as needed (for pain.).     Marland Kitchen albuterol (PROVENTIL HFA;VENTOLIN HFA) 108 (90 Base) MCG/ACT inhaler Inhale 2 puffs into the lungs every 4 (four) hours as needed for wheezing or shortness of breath (cough, shortness of breath or wheezing.). 1 Inhaler 1  . amLODipine (NORVASC) 10 MG tablet Take 1 tablet (10 mg total) by mouth daily. 90 tablet 2  . aspirin EC 81 MG tablet Take 81 mg by mouth daily.     Marland Kitchen atorvastatin (LIPITOR) 10 MG tablet Take 1 tablet (10 mg total) by mouth daily. 90 tablet 3  . carvedilol (COREG) 25 MG tablet Take 1 tablet (25 mg total) by mouth 2 (two) times daily. 180 tablet 2  . cetirizine (ZYRTEC) 10 MG tablet Take 10 mg by mouth at bedtime as needed for allergies.    . CIALIS 20 MG tablet Take 20 mg by mouth daily as needed for erectile dysfunction. DO NOT EXCEED 2 DOSES WITHIN 7 DAYS    . diphenhydrAMINE (BENADRYL) 25 mg  capsule Take 25 mg by mouth every 6 (six) hours as needed for allergies.     . fluocinonide cream (LIDEX) 3.38 % Apply 1 application topically 2 (two) times daily as needed (for itchy rash).     . hydrALAZINE (APRESOLINE) 50 MG tablet Take 1 tablet (50 mg total) by mouth 3 (three) times daily. 90 tablet 0  . omalizumab (XOLAIR) 150 MG injection Inject 150 mg into the skin every 14 (fourteen) days.    . potassium chloride (MICRO-K) 10 MEQ CR capsule Take 10 mEq by mouth as needed.    . testosterone cypionate (DEPOTESTOTERONE CYPIONATE) 100 MG/ML injection Inject 400 mg into the muscle every 21 ( twenty-one) days. For IM use  only    . torsemide (DEMADEX) 20 MG tablet Take 3 tablets (60 mg total) by mouth daily. 90 tablet 0  . triamcinolone (NASACORT AQ) 55 MCG/ACT AERO nasal inhaler Place 2 sprays into the nose daily.    Marland Kitchen zolpidem (AMBIEN) 10 MG tablet Take one-half to one tablet by mouth at bedtime as needed for sleep. 30 tablet 1   Current Facility-Administered Medications on File Prior to Visit  Medication Dose Route Frequency Provider Last Rate Last Dose  . omalizumab Arvid Right) injection 300 mg  300 mg Subcutaneous Q14 Days Javier Glazier, MD   300 mg at 12/03/16 0859  . omalizumab Arvid Right) injection 300 mg  300 mg Subcutaneous Q14 Days Javier Glazier, MD   300 mg at 12/17/16 0853  . omalizumab Arvid Right) injection 300 mg  300 mg Subcutaneous Q14 Days Javier Glazier, MD   300 mg at 01/16/17 1633  . omalizumab Arvid Right) injection 300 mg  300 mg Subcutaneous Q14 Days Javier Glazier, MD   300 mg at 01/29/17 0901    Past Medical History:  Diagnosis Date  . Abnormal nuclear cardiac imaging test Nov 2010   moderate area of infarct in the inferior wall with only minimal reversibility and EF of 28%  . AICD (automatic cardioverter/defibrillator) present   . Allergic rhinitis 01-08-13   Uses nebulizer for chronic sinus issues and Mucinex.  . Arthritis    osteoarthritis   . Asthma    Extrinic  . Blood transfusion    ? at time of bypass surgery   . BPH (benign prostatic hyperplasia)   . Cellulitis of arm, left    MSSA  . CHF (congestive heart failure) ([place])   . CKD (chronic kidney disease), stage III    "was told due to meds he takes"-no Renal workup done  . Colon polyps    adenomatous  . Coronary artery disease    remote CABG in 1985, cath in 2003 by Dr. Lia Foyer with no PCI, last nuclear in 2010 showing scar and EF of 28%.   . Diabetes mellitus   . Dyslipidemia   . Glaucoma 01-08-13   tx. eye drops  . HOH (hard of hearing)   . HTN (hypertension)   . Hypercholesterolemia   . Left ventricular  dysfunction    28% per nuclear in 2010 and 35 to 40% per echo in 2010  . Myocardial infarction ([place])    1985  . Neuromuscular disorder ([place])    legs mild paralysis-able to walk"nerve damage"-legs- left leg brace   . Neuropathy   . Pneumonia    hx of several times years ago   . Spinal stenosis     Past Surgical History:  Procedure Laterality Date  . A/V SHUNTOGRAM Right 10/17/2016   Procedure:  A/V Fistulagram;  Surgeon: Conrad Lake Alfred, MD;  Location: [facility];  Service: Cardiovascular;  Laterality: Right;  . BACK SURGERY     hx of back surgery x 4   . BASCILIC VEIN TRANSPOSITION Right 09/09/2016   Procedure: BASCILIC VEIN TRANSPOSITION Right Arm;  Surgeon: Rosetta Posner, MD;  Location: [facility] OR;  Service: Vascular;  Laterality: Right;  . BI-VENTRICULAR IMPLANTABLE CARDIOVERTER DEFIBRILLATOR N/A 10/10/2014   Procedure: BI-VENTRICULAR IMPLANTABLE CARDIOVERTER DEFIBRILLATOR  (CRT-D);  Surgeon: [name], MD;  Location: [facility] CATH LAB;  Service: Cardiovascular;  Laterality: N/A;  . CARDIAC CATHETERIZATION  2003  . Cataract      recent cataract surgery 6'14  . COLONOSCOPY N/A 01/25/2013   Procedure: COLONOSCOPY;  Surgeon: Irene Shipper, MD;  Location: [facility];  Service: Endoscopy;  Laterality: N/A;  . CORONARY ARTERY BYPASS GRAFT  1985   x 5 vessels  . LUMBAR DISC SURGERY    . PERIPHERAL VASCULAR BALLOON ANGIOPLASTY Right 10/17/2016   Procedure: Peripheral Vascular Balloon Angioplasty;  Surgeon: Conrad Enlow, MD;  Location: [place] [facility];  Service: Cardiovascular;  Laterality: Right;  ARM VEINOUS AND CENTRAL VEIN  . PR POLYSOM 6/>YRS SLEEP 4/> ADDL PARAM ATTND  12/07/2015  . PROSTATE SURGERY    . TOTAL KNEE ARTHROPLASTY  08/01/2011   Procedure: TOTAL KNEE ARTHROPLASTY;  Surgeon: Johnn Hai;  Location: [facility] ORS;  Service: Orthopedics;  Laterality: Right;    Social History   Social History  . Marital status: Married    Spouse name: N/A  . Number of children: 2  . Years of  education: N/A   Occupational History  . pastor    Social History Main Topics  . Smoking status: Former Smoker    Packs/day: 1.00    Years: 5.00    Types: Cigarettes, Pipe, Cigars    Quit date: 08/05/1958  . Smokeless tobacco: Never Used  . Alcohol use No  . Drug use: No  . Sexual activity: Not Currently   Other Topics Concern  . None   Social History Narrative   Originally from [place]. He has always lived in [place]. Prior travel to [place], [place], [place], [place] ,[place], & [place]. 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 Has adopted children. Has a dog currently. No bird exposure. No mold exposure. Enjoys reading & traveling.    Family History  Problem Relation Age of Onset  . Heart attack Father   . Heart disease Father   . Colon polyps Father   . Lung cancer Mother   . Diabetes Sister        x 2  . Heart disease Brother        x 2  . Bone cancer Brother   . Lung disease Neg Hx     Review of Systems  Constitutional: Negative for chills, fever and unexpected weight change.  Respiratory: Positive for shortness of breath (occaisonally). Negative for cough and wheezing.   Cardiovascular: Positive for leg swelling ( chronic, stable). Negative for chest pain and palpitations.  Gastrointestinal:       No incontinence  Genitourinary:       No incontinence  Musculoskeletal: Positive for back pain.  Neurological: Positive for numbness. Negative for weakness, light-headedness and headaches.       Objective:   Vitals:   03/04/17 1325  BP: 138/66  Pulse: 73  Resp: 16  Temp: 98.1 F (36.7 C)   Filed Weights   03/04/17  1325  Weight: 215 lb (97.5 kg)   Body mass index is 30.85 kg/m.  Wt Readings from Last 3 Encounters:  03/04/17 215 lb (97.5 kg)  02/19/17 216 lb (98 kg)  02/10/17 215 lb (97.5 kg)     Physical Exam  Constitutional: He is oriented to person, place, and time. He appears well-developed and well-nourished. No distress.  HENT:    Head: Normocephalic and atraumatic.  Cardiovascular: Normal rate and regular rhythm.   Murmur (3/6 systolic murmur) heard. Pulmonary/Chest: Effort normal and breath sounds normal. No respiratory distress. He has no rales.  Musculoskeletal: He exhibits edema (1+ mildly pitting edema b/l LE).  Neurological: He is alert and oriented to person, place, and time.  Skin: Skin is warm and dry. He is not diaphoretic.    Diabetic Foot Exam - Simple   Simple Foot Form Diabetic Foot exam was performed with the following findings:  Yes 03/04/2017  1:51 PM  Visual Inspection No deformities, no ulcerations, no other skin breakdown bilaterally:  Yes Sensation Testing See comments:  Yes Pulse Check Posterior Tibialis and Dorsalis pulse intact bilaterally:  Yes Comments Significantly decreased sensation to light touch and monofilament b/l dorsal and plantar surfaces        Assessment & Plan:   See Problem List for Assessment and Plan of chronic medical problems.

## 2017-03-04 NOTE — Assessment & Plan Note (Signed)
BP Readings from Last 3 Encounters:  03/04/17 138/66  02/19/17 130/62  02/10/17 (!) 144/82    BP well controlled Current regimen effective and well tolerated Continue current medications at current doses

## 2017-03-04 NOTE — Assessment & Plan Note (Signed)
In b/l feet to mid lower legs - numbness/tingling and discomfort Balance is getting worse Will rule out other contributing causes - check labs, including a1c, B12 Will refer to neuro Will try gabapentin 100 mg at night - this can be increased - not sure how much benefit he will get from this since his balance is his biggest concern May benefit from PT - will see neuro first

## 2017-03-04 NOTE — Assessment & Plan Note (Signed)
Related to peripheral neuropathy As above Will try gabapentin to see if this helps some of his symptoms - can consider increasing dose of medication if tolerated and helpful

## 2017-03-04 NOTE — Assessment & Plan Note (Signed)
Controlled with diet Will check a1c to make sure sugars are not contributing to neuropathy Continue regular exercise and diabetic diet

## 2017-03-05 DIAGNOSIS — E291 Testicular hypofunction: Secondary | ICD-10-CM | POA: Diagnosis not present

## 2017-03-06 ENCOUNTER — Encounter: Payer: Self-pay | Admitting: Podiatry

## 2017-03-06 ENCOUNTER — Ambulatory Visit (INDEPENDENT_AMBULATORY_CARE_PROVIDER_SITE_OTHER): Payer: Medicare Other | Admitting: Podiatry

## 2017-03-06 DIAGNOSIS — M79675 Pain in left toe(s): Secondary | ICD-10-CM | POA: Diagnosis not present

## 2017-03-06 DIAGNOSIS — B351 Tinea unguium: Secondary | ICD-10-CM | POA: Diagnosis not present

## 2017-03-06 DIAGNOSIS — E1142 Type 2 diabetes mellitus with diabetic polyneuropathy: Secondary | ICD-10-CM

## 2017-03-06 NOTE — Progress Notes (Signed)
Complaint:  Visit Type: Patient returns to my office for continued preventative foot care services. Complaint: Patient states" my nail has  grown long and thick and become painful to walk and wear shoes" Patient has been diagnosed with DM with no foot neuropathy.. The patient presents for preventative foot care services. No changes to ROS  Podiatric Exam: Vascular: dorsalis pedis and posterior tibial pulses are palpable bilateral. Capillary return is immediate. Temperature gradient is WNL. Skin turgor WNL  Sensorium: Normal Semmes Weinstein monofilament test. Normal tactile sensation bilaterally. Nail Exam: Pt has thick disfigured discolored nails with subungual debris noted  entire nail hallux left foot. Ulcer Exam: There is no evidence of ulcer or pre-ulcerative changes or infection. Orthopedic Exam: Muscle tone and strength are WNL. No limitations in general ROM. No crepitus or effusions noted. Foot type and digits show no abnormalities. Bony prominences are unremarkable. Skin: No Porokeratosis. No infection or ulcers  Diagnosis:  Onychomycosis, ,  pain in left toes  Treatment & Plan Procedures and Treatment: Consent by patient was obtained for treatment procedures. The patient understood the discussion of treatment and procedures well. All questions were answered thoroughly reviewed. Debridement of mycotic and hypertrophic toenails, 1 left  and clearing of subungual debris. No ulceration, no infection noted.  Return Visit-Office Procedure: Patient instructed to return to the office for a follow up visit 3 months for continued evaluation and treatment.    Gardiner Barefoot DPM

## 2017-03-13 DIAGNOSIS — M1712 Unilateral primary osteoarthritis, left knee: Secondary | ICD-10-CM | POA: Diagnosis not present

## 2017-03-19 DIAGNOSIS — I129 Hypertensive chronic kidney disease with stage 1 through stage 4 chronic kidney disease, or unspecified chronic kidney disease: Secondary | ICD-10-CM | POA: Diagnosis not present

## 2017-03-19 DIAGNOSIS — E669 Obesity, unspecified: Secondary | ICD-10-CM | POA: Diagnosis not present

## 2017-03-19 DIAGNOSIS — N184 Chronic kidney disease, stage 4 (severe): Secondary | ICD-10-CM | POA: Diagnosis not present

## 2017-03-19 DIAGNOSIS — I255 Ischemic cardiomyopathy: Secondary | ICD-10-CM | POA: Diagnosis not present

## 2017-03-19 DIAGNOSIS — I519 Heart disease, unspecified: Secondary | ICD-10-CM | POA: Diagnosis not present

## 2017-03-19 DIAGNOSIS — E1129 Type 2 diabetes mellitus with other diabetic kidney complication: Secondary | ICD-10-CM | POA: Diagnosis not present

## 2017-03-27 DIAGNOSIS — E291 Testicular hypofunction: Secondary | ICD-10-CM | POA: Diagnosis not present

## 2017-03-28 ENCOUNTER — Ambulatory Visit (INDEPENDENT_AMBULATORY_CARE_PROVIDER_SITE_OTHER): Payer: Medicare Other

## 2017-03-28 DIAGNOSIS — J4540 Moderate persistent asthma, uncomplicated: Secondary | ICD-10-CM

## 2017-03-31 ENCOUNTER — Telehealth: Payer: Self-pay | Admitting: Pulmonary Disease

## 2017-03-31 ENCOUNTER — Ambulatory Visit (INDEPENDENT_AMBULATORY_CARE_PROVIDER_SITE_OTHER): Payer: Medicare Other

## 2017-03-31 DIAGNOSIS — Z95810 Presence of automatic (implantable) cardiac defibrillator: Secondary | ICD-10-CM

## 2017-03-31 DIAGNOSIS — I5022 Chronic systolic (congestive) heart failure: Secondary | ICD-10-CM

## 2017-03-31 NOTE — Progress Notes (Signed)
EPIC Encounter for ICM Monitoring  Patient Name: Shane [name] is a 83 y.o. male Date: 03/31/2017 Primary Care Physican: [name], MD Primary Cardiologist:Nahser/Bensimhon Electrophysiologist: Caryl Comes Nephrologist: Hollie Salk at [facility] Weight:  210lbs Bi-V Pacing: 92.3%         Heart Failure questions reviewed, pt has had some shortness of breath intermittently in the last couple of weeks.  He took a Metolazone last week with relief of SOB   Thoracic impedance normal today but has been abnormal suggesting fluid accumulation from 03/10/2017 to 03/31/2017.  Prescribed dosage: Torsemide 20 mg 3 tablets (60 mg total) daily.  Metolazone 5 mg 1 tablet for weight gain >214 lbs.  He reported taking Metolazone about once a week for weight gain.    Labs: 01/08/2017 Creatinine 2.98, BUN 82, Potassium 3.0, Sodium 136, EGFR 18-21 12/10/2016 Creatinine 2.66, BUN 70, Potassium 3.5, Sodium 138, EGFR 21-24 12/03/2016 Creatinine 2.70, BUN 72, Potassium 3.5, Sodium 138, EGFR 21-24 11/04/2016 Creatinine 3.27, BUN 79, Potassium 3.9, Sodium 137, EGFR 16-19 10/18/2016 Creatinine 2.7, BUN 63. Potassium 3.6, Sodium 138 10/02/2016 Creatinine 2.43, BUN 66, Potassium 3.6, Sodium 139, EGFR 24-28 11/27/2017Creatinine 3.09, BUN 74, Potassium 4.3, Sodium 136 06/07/2016 Creatinine 2.59, BUN 65, Potassium 3.6, Sodium 139, EGFR 22-25  05/01/2016 Creatinine 2.64, BUN 48, Potassium 3.8, Sodium 137  04/24/2016 Creatinine 2.46, BUN 38, Potassium 3.9, Sodium 138, EGFR 23-27  03/26/2016 Creatinine 2.28, BUN 35, Potassium 4.2, Sodium 140   Recommendations: No changes.  Advised to limit salt intake to 2000 mg/day and fluid intake to < 2 liters/day.  Encouraged to call for fluid symptoms.  Follow-up plan: ICM clinic phone appointment on 05/08/2017.  Office appointment scheduled 04/28/2017 with Dr. Acie Fredrickson.  Copy of ICM check sent to Dr. Haroldine Laws and Dr. Caryl Comes.   3 month ICM trend: 03/31/2017   1 Year  ICM trend:      Rosalene Billings, RN 03/31/2017 9:03 AM

## 2017-03-31 NOTE — Telephone Encounter (Signed)
#   vials:4 Ordered date:03/31/17 Shipping Date:03/31/17

## 2017-04-01 NOTE — Telephone Encounter (Signed)
#   Vials:4 Arrival Date:04/01/17 Lot #:[id number] [id number]

## 2017-04-03 ENCOUNTER — Other Ambulatory Visit: Payer: Self-pay | Admitting: Internal Medicine

## 2017-04-03 DIAGNOSIS — F5101 Primary insomnia: Secondary | ICD-10-CM

## 2017-04-04 ENCOUNTER — Ambulatory Visit (INDEPENDENT_AMBULATORY_CARE_PROVIDER_SITE_OTHER): Payer: Medicare Other | Admitting: Podiatry

## 2017-04-04 ENCOUNTER — Other Ambulatory Visit: Payer: Self-pay | Admitting: Internal Medicine

## 2017-04-04 ENCOUNTER — Encounter: Payer: Self-pay | Admitting: Podiatry

## 2017-04-04 VITALS — BP 155/83 | HR 72 | Resp 16

## 2017-04-04 DIAGNOSIS — F5101 Primary insomnia: Secondary | ICD-10-CM

## 2017-04-04 DIAGNOSIS — S91209A Unspecified open wound of unspecified toe(s) with damage to nail, initial encounter: Secondary | ICD-10-CM

## 2017-04-04 NOTE — Telephone Encounter (Signed)
[place] Controlled Substance Database checked. Last filled on 02/19/17. If okay I will call into pharm.

## 2017-04-04 NOTE — Progress Notes (Signed)
Subjective:  Patient ID: Shane [name], male    DOB: 16-Apr-1934,  MRN: [id number] HPI Chief Complaint  Patient presents with  . Toe Pain    Hallux right   "This toenail has been dark for 2 months and this morning I woke up and it was bleeding under the toenail"    83 y.o. male presents with the above complaint. States that this morning his nail was bleeding. Unsure if he hit it. Endorses some neuropathy. States that he feels the nail is loose.  Past Medical History:  Diagnosis Date  . Abnormal nuclear cardiac imaging test Nov 2010   moderate area of infarct in the inferior wall with only minimal reversibility and EF of 28%  . AICD (automatic cardioverter/defibrillator) present   . Allergic rhinitis 01-08-13   Uses nebulizer for chronic sinus issues and Mucinex.  . Arthritis    osteoarthritis   . Asthma    Extrinic  . Blood transfusion    ? at time of bypass surgery   . BPH (benign prostatic hyperplasia)   . Cellulitis of arm, left    MSSA  . CHF (congestive heart failure) ([place])   . CKD (chronic kidney disease), stage III    "was told due to meds he takes"-no Renal workup done  . Colon polyps    adenomatous  . Coronary artery disease    remote CABG in 1985, cath in 2003 by Dr. Lia Foyer with no PCI, last nuclear in 2010 showing scar and EF of 28%.   . Diabetes mellitus   . Dyslipidemia   . Glaucoma 01-08-13   tx. eye drops  . HOH (hard of hearing)   . HTN (hypertension)   . Hypercholesterolemia   . Left ventricular dysfunction    28% per nuclear in 2010 and 35 to 40% per echo in 2010  . Myocardial infarction ([place])    1985  . Neuromuscular disorder ([place])    legs mild paralysis-able to walk"nerve damage"-legs- left leg brace   . Neuropathy   . Pneumonia    hx of several times years ago   . Spinal stenosis    Past Surgical History:  Procedure Laterality Date  . A/V FISTULAGRAM Right 10/17/2016   Procedure: A/V Fistulagram;  Surgeon: Conrad Marshall, MD;  Location: [facility];  Service: Cardiovascular;  Laterality: Right;  . BACK SURGERY     hx of back surgery x 4   . BASCILIC VEIN TRANSPOSITION Right 09/09/2016   Procedure: BASCILIC VEIN TRANSPOSITION Right Arm;  Surgeon: Rosetta Posner, MD;  Location: [facility] OR;  Service: Vascular;  Laterality: Right;  . BI-VENTRICULAR IMPLANTABLE CARDIOVERTER DEFIBRILLATOR N/A 10/10/2014   Procedure: BI-VENTRICULAR IMPLANTABLE CARDIOVERTER DEFIBRILLATOR  (CRT-D);  Surgeon: [name], MD;  Location: [facility] CATH LAB;  Service: Cardiovascular;  Laterality: N/A;  . CARDIAC CATHETERIZATION  2003  . Cataract      recent cataract surgery 6'14  . COLONOSCOPY N/A 01/25/2013   Procedure: COLONOSCOPY;  Surgeon: Irene Shipper, MD;  Location: [facility];  Service: Endoscopy;  Laterality: N/A;  . CORONARY ARTERY BYPASS GRAFT  1985   x 5 vessels  . LUMBAR DISC SURGERY    . PERIPHERAL VASCULAR BALLOON ANGIOPLASTY Right 10/17/2016   Procedure: Peripheral Vascular Balloon Angioplasty;  Surgeon: Conrad Longview, MD;  Location: [facility];  Service: Cardiovascular;  Laterality: Right;  ARM VEINOUS AND CENTRAL VEIN  . PR POLYSOM 6/>YRS SLEEP 4/> ADDL PARAM ATTND  12/07/2015  .  PROSTATE SURGERY    . TOTAL KNEE ARTHROPLASTY  08/01/2011   Procedure: TOTAL KNEE ARTHROPLASTY;  Surgeon: Johnn Hai;  Location: [facility]S;  Service: Orthopedics;  Laterality: Right;    Current Outpatient Prescriptions:  .  acetaminophen (TYLENOL) 500 MG tablet, Take 1,000 mg by mouth every 8 (eight) hours as needed (for pain.). , Disp: , Rfl:  .  albuterol (PROVENTIL HFA;VENTOLIN HFA) 108 (90 Base) MCG/ACT inhaler, Inhale 2 puffs into the lungs every 4 (four) hours as needed for wheezing or shortness of breath (cough, shortness of breath or wheezing.)., Disp: 1 Inhaler, Rfl: 1 .  amLODipine (NORVASC) 10 MG tablet, Take 1 tablet (10 mg total) by mouth daily., Disp: 90 tablet, Rfl: 2 .  aspirin EC 81 MG tablet, Take 81 mg by mouth daily. , Disp: , Rfl:  .   atorvastatin (LIPITOR) 10 MG tablet, Take 1 tablet (10 mg total) by mouth daily., Disp: 90 tablet, Rfl: 3 .  carvedilol (COREG) 25 MG tablet, Take 1 tablet (25 mg total) by mouth 2 (two) times daily., Disp: 180 tablet, Rfl: 2 .  cetirizine (ZYRTEC) 10 MG tablet, Take 10 mg by mouth at bedtime as needed for allergies., Disp: , Rfl:  .  CIALIS 20 MG tablet, Take 20 mg by mouth daily as needed for erectile dysfunction. DO NOT EXCEED 2 DOSES WITHIN 7 DAYS, Disp: , Rfl:  .  diphenhydrAMINE (BENADRYL) 25 mg capsule, Take 25 mg by mouth every 6 (six) hours as needed for allergies. , Disp: , Rfl:  .  fluocinonide cream (LIDEX) 2.02 %, Apply 1 application topically 2 (two) times daily as needed (for itchy rash). , Disp: , Rfl:  .  gabapentin (NEURONTIN) 100 MG capsule, Take 1 capsule (100 mg total) by mouth at bedtime., Disp: 90 capsule, Rfl: 3 .  hydrALAZINE (APRESOLINE) 50 MG tablet, Take 1 tablet (50 mg total) by mouth 3 (three) times daily., Disp: 90 tablet, Rfl: 0 .  omalizumab (XOLAIR) 150 MG injection, Inject 150 mg into the skin every 14 (fourteen) days., Disp: , Rfl:  .  potassium chloride (MICRO-K) 10 MEQ CR capsule, Take 10 mEq by mouth as needed., Disp: , Rfl:  .  testosterone cypionate (DEPOTESTOTERONE CYPIONATE) 100 MG/ML injection, Inject 400 mg into the muscle every 21 ( twenty-one) days. For IM use only, Disp: , Rfl:  .  torsemide (DEMADEX) 20 MG tablet, Take 3 tablets (60 mg total) by mouth daily., Disp: 90 tablet, Rfl: 0 .  triamcinolone (NASACORT AQ) 55 MCG/ACT AERO nasal inhaler, Place 2 sprays into the nose daily., Disp: , Rfl:  .  zolpidem (AMBIEN) 10 MG tablet, TAKE 1/2 TO 1 (ONE-HALF TO ONE) TABLET BY MOUTH AT BEDTIME AS NEEDED FOR SLEEP, Disp: 30 tablet, Rfl: 1  Current Facility-Administered Medications:  .  omalizumab Arvid Right) injection 300 mg, 300 mg, Subcutaneous, Q14 Days, Javier Glazier, MD, 300 mg at 12/03/16 0859 .  omalizumab Arvid Right) injection 300 mg, 300 mg,  Subcutaneous, Q14 Days, Javier Glazier, MD, 300 mg at 12/17/16 0853 .  omalizumab Arvid Right) injection 300 mg, 300 mg, Subcutaneous, Q14 Days, Javier Glazier, MD, 300 mg at 01/16/17 1633 .  omalizumab Arvid Right) injection 300 mg, 300 mg, Subcutaneous, Q14 Days, Javier Glazier, MD, 300 mg at 01/29/17 0901  Allergies  Allergen Reactions  . Codeine Other (See Comments)    anxiety  . Hydrocodone Other (See Comments)    anxiety  . Seasonal Ic [Cholestatin]   . Azithromycin Rash  Review of Systems Objective:   Vitals:   04/04/17 1400  BP: (!) 155/83  Pulse: 72  Resp: 16   General AA&O x3. Normal mood and affect.  Vascular Dorsalis pedis and posterior tibial pulses  present 1+ bilaterally  Capillary refill delayed ~ 5 seconds to all digits. Pedal hair growth absent.  Neurologic Epicritic sensation absent bilaterally.  Dermatologic No open lesions. Interspaces clear of maceration.  Normal skin temperature and turgor. Hyperkeratotic lesions: none bilaterally. Nails: R hallux nail black with >50% lysis. Upon removal nail bed without signs of injury.  Orthopedic: No history of amputation. MMT 5/5 in dorsiflexion, plantarflexion, inversion, and eversion. Normal lower extremity joint ROM without pain or crepitus.     Assessment & Plan:  Patient was evaluated and treated and all questions answered.  Traumatic Lysis of Toenail -Nail gently and manually removed. Non-procedural service. -Educated patient on self-care. To apply neosporin and a band-aid daily.  Return in about 1 week (around 04/11/2017).

## 2017-04-04 NOTE — Telephone Encounter (Signed)
Ok to fill 

## 2017-04-08 MED ORDER — OMALIZUMAB 150 MG ~~LOC~~ SOLR
300.0000 mg | Freq: Once | SUBCUTANEOUS | Status: AC
  Administered 2017-03-28: 300 mg via SUBCUTANEOUS

## 2017-04-10 ENCOUNTER — Encounter: Payer: Self-pay | Admitting: Podiatry

## 2017-04-10 ENCOUNTER — Ambulatory Visit (INDEPENDENT_AMBULATORY_CARE_PROVIDER_SITE_OTHER): Payer: Medicare Other | Admitting: Podiatry

## 2017-04-10 DIAGNOSIS — S91209S Unspecified open wound of unspecified toe(s) with damage to nail, sequela: Secondary | ICD-10-CM

## 2017-04-10 NOTE — Progress Notes (Signed)
   Subjective:    Patient ID: Shane [name], male    DOB: 1934/04/17, 83 y.o.   MRN: [id number]  HPI 83 y.o. male returns for f/u nail check. States that the nail has been healing without issue. Denies pain, redness, drainage. No new issues today.  Review of Systems     Objective:   Physical Exam General AA&O x3. Normal mood and affect.  Vascular Foot warm and well perfused with good capillary refill.  Neurologic Sensation grossly diminished.  Dermatologic Nail avulsion site healing well without drainage or erythema. Nail bed with overlying soft crust. Left intact. No signs of local infection.  Orthopedic: No tenderness to palpation of the toe.     Assessment & Plan:  S/p Traumatic nail avulsion -Nail healing well without issues. -No pain today.  Follow up in 3 months for routine care.

## 2017-04-11 ENCOUNTER — Ambulatory Visit (INDEPENDENT_AMBULATORY_CARE_PROVIDER_SITE_OTHER): Payer: Medicare Other

## 2017-04-11 DIAGNOSIS — J4540 Moderate persistent asthma, uncomplicated: Secondary | ICD-10-CM | POA: Diagnosis not present

## 2017-04-16 ENCOUNTER — Ambulatory Visit: Payer: Medicare Other | Admitting: Cardiovascular Disease

## 2017-04-17 MED ORDER — OMALIZUMAB 150 MG ~~LOC~~ SOLR
300.0000 mg | Freq: Once | SUBCUTANEOUS | Status: AC
  Administered 2017-04-11: 300 mg via SUBCUTANEOUS

## 2017-04-21 ENCOUNTER — Telehealth (HOSPITAL_COMMUNITY): Payer: Self-pay | Admitting: *Deleted

## 2017-04-21 NOTE — Telephone Encounter (Signed)
Patient called complaining of being constipated since starting torsemide.  I advised patient to try an over the counter stool softener and patient stated he would try dulcolax.  I advised him to contact his PCP if stool softener doesn't help.

## 2017-04-22 DIAGNOSIS — E291 Testicular hypofunction: Secondary | ICD-10-CM | POA: Diagnosis not present

## 2017-04-25 ENCOUNTER — Ambulatory Visit (INDEPENDENT_AMBULATORY_CARE_PROVIDER_SITE_OTHER): Payer: Medicare Other | Admitting: Neurology

## 2017-04-25 ENCOUNTER — Ambulatory Visit (INDEPENDENT_AMBULATORY_CARE_PROVIDER_SITE_OTHER): Payer: Medicare Other

## 2017-04-25 ENCOUNTER — Encounter: Payer: Self-pay | Admitting: Neurology

## 2017-04-25 DIAGNOSIS — I255 Ischemic cardiomyopathy: Secondary | ICD-10-CM

## 2017-04-25 DIAGNOSIS — E1142 Type 2 diabetes mellitus with diabetic polyneuropathy: Secondary | ICD-10-CM | POA: Diagnosis not present

## 2017-04-25 DIAGNOSIS — R269 Unspecified abnormalities of gait and mobility: Secondary | ICD-10-CM | POA: Diagnosis not present

## 2017-04-25 DIAGNOSIS — J4540 Moderate persistent asthma, uncomplicated: Secondary | ICD-10-CM

## 2017-04-25 DIAGNOSIS — E1140 Type 2 diabetes mellitus with diabetic neuropathy, unspecified: Secondary | ICD-10-CM

## 2017-04-25 HISTORY — DX: Unspecified abnormalities of gait and mobility: R26.9

## 2017-04-25 HISTORY — DX: Type 2 diabetes mellitus with diabetic neuropathy, unspecified: E11.40

## 2017-04-25 MED ORDER — GABAPENTIN 300 MG PO CAPS: 300.0000 mg | ORAL_CAPSULE | Freq: Three times a day (TID) | ORAL | 3 refills | Status: DC

## 2017-04-25 NOTE — Progress Notes (Signed)
Reason for visit: Peripheral neuropathy  Referring physician: Dr. Syliva Overman is a 83 y.o. male  History of present illness:  Shane [name] is an 83 year old left-handed white male with a history of diabetes and low back issues. The patient has undergone 4 separate lumbosacral spine surgeries, at one point he had significant weakness in both legs prior to surgery. The patient has developed some numbness in the feet that he has been aware of since the 1990s, this has gradually worsened over time it has spread up the legs on both sides. He is still having some low back pain without radiation down the legs. Occasionally he may have some sharp shooting pain in the feet and legs that may occur, but not on a daily basis. The patient does have some trouble sleeping at night but this is usually not due to leg discomfort. The patient denies any neck pain or pain down the arms, he denies any significant numbness in the hands. He does note some problems with balance, he has not had any falls, he does not use a cane for ambulation. He does operate a motor vehicle but he claims that he has no problems with distinction between the accelerator and the brake. The patient is on low-dose gabapentin taking 100 mg at night. He is sent to this office for further evaluation. A recent B12 level was normal.  Past Medical History:  Diagnosis Date  . Abnormal nuclear cardiac imaging test Nov 2010   moderate area of infarct in the inferior wall with only minimal reversibility and EF of 28%  . AICD (automatic cardioverter/defibrillator) present   . Allergic rhinitis 01-08-13   Uses nebulizer for chronic sinus issues and Mucinex.  . Arthritis    osteoarthritis   . Asthma    Extrinic  . Blood transfusion    ? at time of bypass surgery   . BPH (benign prostatic hyperplasia)   . Cellulitis of arm, left    MSSA  . CHF (congestive heart failure) ([place])   . CKD (chronic kidney disease), stage III    "was told  due to meds he takes"-no Renal workup done  . Colon polyps    adenomatous  . Coronary artery disease    remote CABG in 1985, cath in 2003 by Dr. Lia Foyer with no PCI, last nuclear in 2010 showing scar and EF of 28%.   . Diabetes mellitus   . Dyslipidemia   . Glaucoma 01-08-13   tx. eye drops  . HOH (hard of hearing)   . HTN (hypertension)   . Hypercholesterolemia   . Left ventricular dysfunction    28% per nuclear in 2010 and 35 to 40% per echo in 2010  . Myocardial infarction ([place])    1985  . Neuromuscular disorder ([place])    legs mild paralysis-able to walk"nerve damage"-legs- left leg brace   . Neuropathy   . Pneumonia    hx of several times years ago   . Spinal stenosis     Past Surgical History:  Procedure Laterality Date  . A/V FISTULAGRAM Right 10/17/2016   Procedure: A/V Fistulagram;  Surgeon: Conrad [place]ry[place], MD;  Location: [facility];  Service: Cardiovascular;  Laterality: Right;  . BACK SURGERY     hx of back surgery x 4   . BASCILIC VEIN TRANSPOSITION Right 09/09/2016   Procedure: BASCILIC VEIN TRANSPOSITION Right Arm;  Surgeon: Rosetta Posner, MD;  Location: [facility];  Service: Vascular;  Laterality:  Right;  . BI-VENTRICULAR IMPLANTABLE CARDIOVERTER DEFIBRILLATOR N/A 10/10/2014   Procedure: BI-VENTRICULAR IMPLANTABLE CARDIOVERTER DEFIBRILLATOR  (CRT-D);  Surgeon: [name], MD;  Location: [facility] CATH [facility];  Service: Cardiovascular;  Laterality: N/A;  . CARDIAC CATHETERIZATION  2003  . Cataract      recent cataract surgery 6'14  . COLONOSCOPY N/A 01/25/2013   Procedure: COLONOSCOPY;  Surgeon: Irene Shipper, MD;  Location: [facility];  Service: Endoscopy;  Laterality: N/A;  . CORONARY ARTERY BYPASS GRAFT  1985   x 5 vessels  . LUMBAR DISC SURGERY    . PERIPHERAL VASCULAR BALLOON ANGIOPLASTY Right 10/17/2016   Procedure: Peripheral Vascular Balloon Angioplasty;  Surgeon: Conrad [place], MD;  Location: [facility] [facility];  Service: Cardiovascular;  Laterality: Right;  ARM  VEINOUS AND CENTRAL VEIN  . PR POLYSOM 6/>YRS SLEEP 4/> ADDL PARAM ATTND  12/07/2015  . PROSTATE SURGERY    . TOTAL KNEE ARTHROPLASTY  08/01/2011   Procedure: TOTAL KNEE ARTHROPLASTY;  Surgeon: Johnn Hai;  Location: [facility];  Service: Orthopedics;  Laterality: Right;    Family History  Problem Relation Age of Onset  . Heart attack Father   . Heart disease Father   . Colon polyps Father   . Lung cancer Mother   . Diabetes Sister        x 2  . Heart disease Brother        x 2  . Bone cancer Brother   . Lung disease Neg Hx     Social history:  reports that he quit smoking about 58 years ago. His smoking use included Cigarettes, Pipe, and Cigars. He has a 5.00 pack-year smoking history. He has never used smokeless tobacco. He reports that he does not drink alcohol or use drugs.  Medications:  Prior to Admission medications   Medication Sig Start Date End Date Taking? Authorizing Provider  acetaminophen (TYLENOL) 500 MG tablet Take 1,000 mg by mouth every 8 (eight) hours as needed (for pain.).    Yes [provider]  albuterol (PROVENTIL HFA;VENTOLIN HFA) 108 (90 Base) MCG/ACT inhaler Inhale 2 puffs into the lungs every 4 (four) hours as needed for wheezing or shortness of breath (cough, shortness of breath or wheezing.). 02/10/17  Yes Elby Beck, FNP  amLODipine (NORVASC) 10 MG tablet Take 1 tablet (10 mg total) by mouth daily. 09/10/16  Yes Nahser, Wonda Cheng, MD  aspirin EC 81 MG tablet Take 81 mg by mouth daily.    Yes [provider]  atorvastatin (LIPITOR) 10 MG tablet Take 1 tablet (10 mg total) by mouth daily. 05/02/16  Yes Bensimhon, Shaune Pascal, MD  carvedilol (COREG) 25 MG tablet Take 1 tablet (25 mg total) by mouth 2 (two) times daily. 01/03/17  Yes [name], MD  cetirizine (ZYRTEC) 10 MG tablet Take 10 mg by mouth at bedtime as needed for allergies.   Yes [provider]  CIALIS 20 MG tablet Take 20 mg by mouth daily as needed for erectile  dysfunction. DO NOT EXCEED 2 DOSES WITHIN 7 DAYS 09/20/11  Yes [provider]  diphenhydrAMINE (BENADRYL) 25 mg capsule Take 25 mg by mouth every 6 (six) hours as needed for allergies.    Yes [provider]  fluocinonide cream (LIDEX) 5.46 % Apply 1 application topically 2 (two) times daily as needed (for itchy rash).  03/16/15  Yes [provider]  gabapentin (NEURONTIN) 100 MG capsule Take 1 capsule (100 mg total) by mouth at  bedtime. 03/04/17  Yes Burns, Claudina Lick, MD  hydrALAZINE (APRESOLINE) 50 MG tablet Take 1 tablet (50 mg total) by mouth 3 (three) times daily. 12/04/16  Yes Shirley Friar, PA-C  potassium chloride (MICRO-K) 10 MEQ CR capsule Take 10 mEq by mouth as needed.   Yes [provider]  testosterone cypionate (DEPOTESTOTERONE CYPIONATE) 100 MG/ML injection Inject 400 mg into the muscle every 21 ( twenty-one) days. For IM use only   Yes [provider]  torsemide (DEMADEX) 20 MG tablet Take 3 tablets (60 mg total) by mouth daily. 12/04/16  Yes Shirley Friar, PA-C  triamcinolone (NASACORT AQ) 55 MCG/ACT AERO nasal inhaler Place 2 sprays into the nose daily.   Yes [provider]  zolpidem (AMBIEN) 10 MG tablet TAKE 1/2 TO 1 (ONE-HALF TO ONE) TABLET BY MOUTH AT BEDTIME AS NEEDED FOR SLEEP 04/04/17  Yes Burns, Claudina Lick, MD      Allergies  Allergen Reactions  . Codeine Other (See Comments)    anxiety  . Hydrocodone Other (See Comments)    anxiety  . Seasonal Ic [Cholestatin]   . Azithromycin Rash    ROS:  Out of a complete 14 system review of symptoms, the patient complains only of the following symptoms, and all other reviewed systems are negative.  Weight gain, fatigue Swelling in the legs Hearing loss Shortness of breath, wheezing Constipation Numbness, weakness Decreased energy Insomnia  Blood pressure 124/67, pulse 81, height 5' 10.5" (1.791 m), weight 218 lb (98.9 kg).  Physical Exam  General:  The patient is alert and cooperative at the time of the examination. The patient is moderately obese.  Eyes: Pupils are equal, round, and reactive to light. Discs are flat bilaterally.  Neck: The neck is supple, no carotid bruits are noted, but there appears to be some radiation of the cardiac murmur into the right carotid.  Respiratory: The respiratory examination is clear.  Cardiovascular: The cardiovascular examination reveals a regular rate and rhythm, with a grade II/VI systolic ejection murmur in the aortic area noted.  Skin: Extremities are with 1-2+ edema below the knees bilaterally.  Neurologic Exam  Mental status: The patient is alert and oriented x 3 at the time of the examination. The patient has apparent normal recent and remote memory, with an apparently normal attention span and concentration ability.  Cranial nerves: Facial symmetry is present. There is good sensation of the face to pinprick and soft touch bilaterally. The strength of the facial muscles and the muscles to head turning and shoulder shrug are normal bilaterally. Speech is well enunciated, no aphasia or dysarthria is noted. Extraocular movements are full. Visual fields are full. The tongue is midline, and the patient has symmetric elevation of the soft palate. No obvious hearing deficits are noted.  Motor: The motor testing reveals 5 over 5 strength of all 4 extremities, with exception of bilateral foot drops, left greater than right and slight weakness of the intrinsic muscles of the left hand. Good symmetric motor tone is noted throughout.  Sensory: Sensory testing is intact to pinprick, soft touch, vibration sensation, and position sense on the upper extremities. With the lower extremities there is a stocking pattern pinprick sensory deficit two thirds the way up the legs bilaterally, significant reduction in vibration sensation position sense is noted in both feet. No evidence of extinction is  noted.  Coordination: Cerebellar testing reveals good finger-nose-finger and heel-to-shin bilaterally. The patient has an intention tremor seen with both upper extremities with  finger-nose-finger bilaterally.  Gait and station: Gait is slightly wide-based, unsteady. The patient has a right>left steppage gait pattern. Tandem gait is unsteady. Romberg is negative.  Reflexes: Deep tendon reflexes are symmetric, but are depressed bilaterally. Toes are downgoing bilaterally.   Assessment/Plan:  1. Diabetes  2. Lumbosacral spine surgery 4 in past  3. Probable diabetic peripheral neuropathy  4. Gait disorder  The patient does have significant sensory alteration and weakness in both legs consistent with a peripheral neuropathy. The degree of changes seen would explain his gait instability. The ability to operate a motor vehicle safely needs to be scrutinized closely. The patient will be set up for blood work today, he will have nerve conduction studies on both legs and the left arm and EMG of one leg. He will follow-up in about 6 months. The gabapentin dose will be increased to 300 mg at night. Fortunately, the patient is not having significant discomfort from the neuropathy.  Jill Alexanders MD 04/25/2017 9:24 AM  [facility] [address] [place], [place] [address]  Phone [phone] Fax [phone]

## 2017-04-25 NOTE — Patient Instructions (Signed)
   We will go up to 300 mg at night of the gabapentin.  We will get EMG and NCV evaluation to look at the nerve function on the legs.

## 2017-04-28 ENCOUNTER — Encounter: Payer: Self-pay | Admitting: Cardiovascular Disease

## 2017-04-28 ENCOUNTER — Telehealth: Payer: Self-pay | Admitting: Pulmonary Disease

## 2017-04-28 ENCOUNTER — Encounter (INDEPENDENT_AMBULATORY_CARE_PROVIDER_SITE_OTHER): Payer: Self-pay

## 2017-04-28 ENCOUNTER — Ambulatory Visit (INDEPENDENT_AMBULATORY_CARE_PROVIDER_SITE_OTHER): Payer: Medicare Other | Admitting: Cardiovascular Disease

## 2017-04-28 VITALS — BP 146/86 | HR 84 | Ht 70.5 in | Wt 222.1 lb

## 2017-04-28 DIAGNOSIS — N189 Chronic kidney disease, unspecified: Secondary | ICD-10-CM

## 2017-04-28 DIAGNOSIS — I2510 Atherosclerotic heart disease of native coronary artery without angina pectoris: Secondary | ICD-10-CM

## 2017-04-28 DIAGNOSIS — I5042 Chronic combined systolic (congestive) and diastolic (congestive) heart failure: Secondary | ICD-10-CM

## 2017-04-28 DIAGNOSIS — I255 Ischemic cardiomyopathy: Secondary | ICD-10-CM

## 2017-04-28 MED ORDER — METOLAZONE 5 MG PO TABS: ORAL_TABLET | ORAL | 3 refills | Status: DC

## 2017-04-28 MED ORDER — TORSEMIDE 20 MG PO TABS: 60.0000 mg | ORAL_TABLET | Freq: Every day | ORAL | 3 refills | Status: DC

## 2017-04-28 NOTE — Progress Notes (Signed)
Cardiology Office Note   Date:  04/28/2017   ID:  Shane [name], DOB [birth year], MRN [id number]  PCP:  [name], MD  Cardiologist:   Mertie Moores, MD   Chief Complaint  Patient presents with  . Coronary Artery Disease  . Congestive Heart Failure   Problem list 1. Coronary artery disease - history of remote coronary artery bypass grafting 2. Chronic systolic congestive heart failure-due to ischemic cardiopathy 3. History of ICD placement - [place]  4. Hyperlipidemia 5. Diabetes mellitus  6. Mild carotid artery disease.    Shane [name] is a 83 y.o. male who presents for follow up of his CAD and CHF. Was last seen by Dr. Mare Ferrari several weeks .   No CP , no dyspnea.  Rides an exercise bike.   Has a bad ankle. Has had right knee replacement  - still has trouble with the left knee  Is retired from [employer].   Sold Maalox  Is currently a pastor , reads quite a bit .  Nov. 27, 2017:  Shane [name] is also seen in CHF clinic .  His chronic kidney disease. His last creatinine is 2.59 Takes Metalozone about once a week .  Able to get out and do his normal activities.   Gets fatigued easily   June 7,2018: Shane [name] is doing wel Carotid artery scan was stable.   Will repeat in 2 years.  Remains active Has CKD,   Followed by nephrology and Bensimhon   Sept. 24, 2018:  Followed by Bensimhon and Caryl Comes  Has been retaining fluid  Was recently changed to torsemide by Bensimhon  Is going to have a remote interrogation ( Optivol )  Took a metalozone 2 days ago and this am  Did notice some increased urine output   Past Medical History:  Diagnosis Date  . Abnormal nuclear cardiac imaging test Nov 2010   moderate area of infarct in the inferior wall with only minimal reversibility and EF of 28%  . AICD (automatic cardioverter/defibrillator) present   . Allergic rhinitis 01-08-13   Uses nebulizer for chronic sinus issues and Mucinex.  . Arthritis    osteoarthritis     . Asthma    Extrinic  . Blood transfusion    ? at time of bypass surgery   . BPH (benign prostatic hyperplasia)   . Cellulitis of arm, left    MSSA  . CHF (congestive heart failure) ([place])   . CKD (chronic kidney disease), stage III    "was told due to meds he takes"-no Renal workup done  . Colon polyps    adenomatous  . Coronary artery disease    remote CABG in 1985, cath in 2003 by Dr. Lia Foyer with no PCI, last nuclear in 2010 showing scar and EF of 28%.   . Diabetes mellitus   . Diabetic neuropathy ([place]) 04/25/2017  . Dyslipidemia   . Gait abnormality 04/25/2017  . Glaucoma 01-08-13   tx. eye drops  . HOH (hard of hearing)   . HTN (hypertension)   . Hypercholesterolemia   . Left ventricular dysfunction    28% per nuclear in 2010 and 35 to 40% per echo in 2010  . Myocardial infarction ([place])    1985  . Neuromuscular disorder ([place])    legs mild paralysis-able to walk"nerve damage"-legs- left leg brace   . Neuropathy   . Pneumonia    hx of several times years ago   . Spinal stenosis  Past Surgical History:  Procedure Laterality Date  . A/V FISTULAGRAM Right 10/17/2016   Procedure: A/V Fistulagram;  Surgeon: Conrad [place], MD;  Location: [place] [facility];  Service: Cardiovascular;  Laterality: Right;  . BACK SURGERY     hx of back surgery x 4   . BASCILIC VEIN TRANSPOSITION Right 09/09/2016   Procedure: BASCILIC VEIN TRANSPOSITION Right Arm;  Surgeon: Rosetta Posner, MD;  Location: [facility];  Service: Vascular;  Laterality: Right;  . BI-VENTRICULAR IMPLANTABLE CARDIOVERTER DEFIBRILLATOR N/A 10/10/2014   Procedure: BI-VENTRICULAR IMPLANTABLE CARDIOVERTER DEFIBRILLATOR  (CRT-D);  Surgeon: [name], MD;  Location: [facility];  Service: Cardiovascular;  Laterality: N/A;  . CARDIAC CATHETERIZATION  2003  . Cataract      recent cataract surgery 6'14  . COLONOSCOPY N/A 01/25/2013   Procedure: COLONOSCOPY;  Surgeon: Irene Shipper, MD;  [facility];  Service: Endoscopy;   Laterality: N/A;  . CORONARY ARTERY BYPASS GRAFT  1985   x 5 vessels  . LUMBAR DISC SURGERY    . PERIPHERAL VASCULAR BALLOON ANGIOPLASTY Right 10/17/2016   Procedure: Peripheral Vascular Balloon Angioplasty;  Surgeon: Conrad Scraper, MD;  Location: [place] [facility];  Service: Cardiovascular;  Laterality: Right;  ARM VEINOUS AND CENTRAL VEIN  . PR POLYSOM 6/>YRS SLEEP 4/> ADDL PARAM ATTND  12/07/2015  . PROSTATE SURGERY    . TOTAL KNEE ARTHROPLASTY  08/01/2011   Procedure: TOTAL KNEE ARTHROPLASTY;  Surgeon: Johnn Hai;  Location: [facility];  Service: Orthopedics;  Laterality: Right;     Current Outpatient Prescriptions  Medication Sig Dispense Refill  . acetaminophen (TYLENOL) 500 MG tablet Take 1,000 mg by mouth every 8 (eight) hours as needed (for pain.).     Marland Kitchen albuterol (PROVENTIL HFA;VENTOLIN HFA) 108 (90 Base) MCG/ACT inhaler Inhale 2 puffs into the lungs every 4 (four) hours as needed for wheezing or shortness of breath (cough, shortness of breath or wheezing.). 1 Inhaler 1  . amLODipine (NORVASC) 10 MG tablet Take 1 tablet (10 mg total) by mouth daily. 90 tablet 2  . aspirin EC 81 MG tablet Take 81 mg by mouth daily.     Marland Kitchen atorvastatin (LIPITOR) 10 MG tablet Take 1 tablet (10 mg total) by mouth daily. 90 tablet 3  . carvedilol (COREG) 25 MG tablet Take 1 tablet (25 mg total) by mouth 2 (two) times daily. 180 tablet 2  . cetirizine (ZYRTEC) 10 MG tablet Take 10 mg by mouth at bedtime as needed for allergies.    . CIALIS 20 MG tablet Take 20 mg by mouth daily as needed for erectile dysfunction. DO NOT EXCEED 2 DOSES WITHIN 7 DAYS    . diphenhydrAMINE (BENADRYL) 25 mg capsule Take 25 mg by mouth every 6 (six) hours as needed for allergies.     . fluocinonide cream (LIDEX) 7.49 % Apply 1 application topically 2 (two) times daily as needed (for itchy rash).     . gabapentin (NEURONTIN) 300 MG capsule Take 1 capsule (300 mg total) by mouth 3 (three) times daily. 30 capsule 3  . hydrALAZINE  (APRESOLINE) 50 MG tablet Take 1 tablet (50 mg total) by mouth 3 (three) times daily. 90 tablet 0  . potassium chloride (MICRO-K) 10 MEQ CR capsule Take 10 mEq by mouth as needed.    . testosterone cypionate (DEPOTESTOTERONE CYPIONATE) 100 MG/ML injection Inject 400 mg into the muscle every 21 ( twenty-one) days. For IM use only    . torsemide (DEMADEX)  20 MG tablet Take 3 tablets (60 mg total) by mouth daily. 90 tablet 0  . triamcinolone (NASACORT AQ) 55 MCG/ACT AERO nasal inhaler Place 2 sprays into the nose daily.    Marland Kitchen zolpidem (AMBIEN) 10 MG tablet TAKE 1/2 TO 1 (ONE-HALF TO ONE) TABLET BY MOUTH AT BEDTIME AS NEEDED FOR SLEEP 30 tablet 1   No current facility-administered medications for this visit.     Allergies:   Codeine; Hydrocodone; Seasonal ic [cholestatin]; and Azithromycin    Social History:  The patient  reports that he quit smoking about 58 years ago. His smoking use included Cigarettes, Pipe, and Cigars. He has a 5.00 pack-year smoking history. He has never used smokeless tobacco. He reports that he does not drink alcohol or use drugs.   Family History:  The patient's family history includes Bone cancer in his brother; Colon polyps in his father; Diabetes in his sister; Heart attack in his father; Heart disease in his brother and father; Lung cancer in his mother.    ROS:  Please see the history of present illness.    Review of Systems: Constitutional:  denies fever, chills, diaphoresis, appetite change and fatigue.  HEENT: denies photophobia, eye pain, redness, hearing loss, ear pain, congestion, sore throat, rhinorrhea, sneezing, neck pain, neck stiffness and tinnitus.  Respiratory: denies SOB, DOE, cough, chest tightness, and wheezing.  Cardiovascular: denies chest pain, palpitations and leg swelling.  Gastrointestinal: denies nausea, vomiting, abdominal pain, diarrhea, constipation, blood in stool.  Genitourinary: denies dysuria, urgency, frequency, hematuria, flank pain  and difficulty urinating.  Musculoskeletal: denies  myalgias, back pain, joint swelling, arthralgias and gait problem.   Skin: denies pallor, rash and wound.  Neurological: denies dizziness, seizures, syncope, weakness, light-headedness, numbness and headaches.   Hematological: denies adenopathy, easy bruising, personal or family bleeding history.  Psychiatric/ Behavioral: denies suicidal ideation, mood changes, confusion, nervousness, sleep disturbance and agitation.       All other systems are reviewed and negative.    PHYSICAL EXAM:  Physical Exam: Blood pressure (!) 146/86, pulse 84, height 5' 10.5" (1.791 m), weight 222 lb 1.9 oz (100.8 kg), SpO2 92 %. General: Well developed, well nourished, in no acute distress.  Head: Normocephalic, atraumatic, sclera non-icteric, mucus membranes are moist  Neck: Supple. Right carotid bruit  Lungs: Clear bilaterally to auscultation without wheezes, rales, or rhonchi. Breathing is unlabored.  Heart: RRR , soft systolic murmur   Abdomen: Soft, non-tender, moderate obesity . non-distended with normoactive bowel sounds. No hepatomegaly. No rebound/guarding. No obvious abdominal masses.  Msk:  Strength and tone appear normal for age.  Extremities: No clubbing or cyanosis. No edema.  Distal pedal pulses are 2+ and equal bilaterally.  Neuro: Alert and oriented X 3. Moves all extremities spontaneously.  Psych:  Responds to questions appropriately with a normal affect.    EKG:  EKG is not ordered today.    Recent Labs: 03/04/2017: ALT 21; BUN 67; Creatinine, Ser 2.39; Hemoglobin 13.2; Platelets 65.0; Potassium 3.4; Sodium 137; TSH 1.58    Lipid Panel    Component Value Date/Time   CHOL 104 (L) 09/18/2015 0955   TRIG 97 09/18/2015 0955   HDL 25 (L) 09/18/2015 0955   CHOLHDL 4.2 09/18/2015 0955   VLDL 19 09/18/2015 0955   LDLCALC 60 09/18/2015 0955      Wt Readings from Last 3 Encounters:  04/28/17 222 lb 1.9 oz (100.8 kg)    04/25/17 218 lb (98.9 kg)  03/04/17 215 lb (97.5 kg)  Other studies Reviewed: Additional studies/ records that were reviewed today include: . Review of the above records demonstrates:    ASSESSMENT AND PLAN:  1. Coronary artery disease - history of remote coronary artery bypass grafting He is not having any angina   2. Chronic systolic congestive heart failure-due to ischemic cardiopathy - EF 40-45% by echo April, 6295 Complicated by his CKD  His metolazone and torsemide have not been working very well recently. He's gained up to 2 and 22 pounds. His instructions are to take metolazone but his weight exceeds 214.  For the next 3 days of instructed him to take torsemide 100 mg a day along with metolazone 5 mg each day. He'll take metolazone approximately 30 minutes prior to the torsemide. Call us back later this week to let us know if this achieved an effective diuresis.  Check a basic medical profile today.  3. History of ICD placement - [place]   4. Hyperlipidemia-   lipids of been stable. We'll check those in the future.  5. Diabetes mellitus -    6. Mild carotid artery disease.  - Mild carotid artery disease.    Current medicines are reviewed at length with the patient today.  The patient does not have concerns regarding medicines.   Disposition:   FU with me in 3 months      Mertie Moores, MD  04/28/2017 3:35 PM    [facility] [place], [place], [place]  [address] Phone: [phone]; Fax: [phone]

## 2017-04-28 NOTE — Patient Instructions (Addendum)
Medication Instructions:  INCREASE Torsemide to 100 mg daily x 3 days then return to 60 mg daily INCREASE Metolazone to 5 mg once daily x 3 days then return to only as needed for weight > 214 lb   Labwork: TODAY - basic metabolic panel  Your physician recommends that you return for lab work in: 4 weeks at your next office visit for basic metabolic panel   Testing/Procedures: None Ordered   Follow-Up: Your physician recommends that you schedule a follow-up appointment in: 4 weeks with Dr. Acie Fredrickson   If you need a refill on your cardiac medications before your next appointment, please call your pharmacy.   Thank you for choosing [facility]! Christen Bame, RN [phone]

## 2017-04-29 LAB — BASIC METABOLIC PANEL
BUN / CREAT RATIO: 30 — AB (ref 10–24)
BUN: 90 mg/dL (ref 8–27)
CO2: 28 mmol/L (ref 20–29)
CREATININE: 2.96 mg/dL — AB (ref 0.76–1.27)
Calcium: 9.1 mg/dL (ref 8.6–10.2)
Chloride: 93 mmol/L — ABNORMAL LOW (ref 96–106)
GFR, EST AFRICAN AMERICAN: 22 mL/min/{1.73_m2} — AB (ref >59)
GFR, EST NON AFRICAN AMERICAN: 19 mL/min/{1.73_m2} — AB (ref >59)
GLUCOSE: 124 mg/dL — AB (ref 65–99)
Potassium: 3.3 mmol/L — ABNORMAL LOW (ref 3.5–5.2)
SODIUM: 138 mmol/L (ref 134–144)

## 2017-04-29 MED ORDER — OMALIZUMAB 150 MG ~~LOC~~ SOLR
300.0000 mg | SUBCUTANEOUS | Status: DC
  Administered 2017-04-25: 300 mg via SUBCUTANEOUS

## 2017-04-29 NOTE — Progress Notes (Signed)
Xolair injection documentation and charges entered by Maury Dus, RMA, based on injection sheet filled out by Clayborne Dana, CMA per office protocol.

## 2017-04-29 NOTE — Telephone Encounter (Signed)
#   vials:4 Ordered date:04/28/17 Shipping Date:04/28/17

## 2017-04-29 NOTE — Telephone Encounter (Signed)
#   Vials:4 Arrival Date:04/29/17 Lot #:[id number]

## 2017-04-30 ENCOUNTER — Encounter: Payer: Self-pay | Admitting: *Deleted

## 2017-04-30 ENCOUNTER — Telehealth: Payer: Self-pay | Admitting: *Deleted

## 2017-04-30 LAB — MULTIPLE MYELOMA PANEL, SERUM
ALBUMIN/GLOB SERPL: 1.3 (ref 0.7–1.7)
Albumin SerPl Elph-Mcnc: 3.3 g/dL (ref 2.9–4.4)
Alpha 1: 0.2 g/dL (ref 0.0–0.4)
Alpha2 Glob SerPl Elph-Mcnc: 0.7 g/dL (ref 0.4–1.0)
B-Globulin SerPl Elph-Mcnc: 0.8 g/dL (ref 0.7–1.3)
GLOBULIN, TOTAL: 2.7 g/dL (ref 2.2–3.9)
Gamma Glob SerPl Elph-Mcnc: 1 g/dL (ref 0.4–1.8)
IGG (IMMUNOGLOBIN G), SERUM: 866 mg/dL (ref 700–1600)
IgA/Immunoglobulin A, Serum: 128 mg/dL (ref 61–437)
IgM (Immunoglobulin M), Srm: 202 mg/dL — ABNORMAL HIGH (ref 15–143)
Total Protein: 6 g/dL (ref 6.0–8.5)

## 2017-04-30 LAB — ANGIOTENSIN CONVERTING ENZYME: Angio Convert Enzyme: 42 U/L (ref 14–82)

## 2017-04-30 LAB — ANA W/REFLEX: ANA: NEGATIVE

## 2017-04-30 LAB — B. BURGDORFI ANTIBODIES

## 2017-04-30 NOTE — Telephone Encounter (Signed)
-----   Message from Kathrynn Ducking, MD sent at 04/30/2017  5:07 PM EDT -----  The blood work results are unremarkable. Please call the patient.  ----- Message ----- From: Lavone Neri Lab Results In Sent: 04/26/2017   7:42 AM To: Kathrynn Ducking, MD

## 2017-04-30 NOTE — Telephone Encounter (Signed)
Called and spoke with patient about unremarkable labs per CW,MD note. Pt verbalized understanding.  

## 2017-05-01 ENCOUNTER — Encounter (HOSPITAL_COMMUNITY): Payer: Self-pay | Admitting: General Practice

## 2017-05-01 ENCOUNTER — Inpatient Hospital Stay (HOSPITAL_COMMUNITY)
Admission: AD | Admit: 2017-05-01 | Discharge: 2017-05-06 | DRG: 291 | Disposition: A | Discharge status: Home Health | Payer: Medicare Other | Source: Ambulatory Visit | Attending: Internal Medicine | Admitting: Internal Medicine

## 2017-05-01 ENCOUNTER — Encounter (HOSPITAL_COMMUNITY): Payer: Self-pay

## 2017-05-01 ENCOUNTER — Ambulatory Visit (HOSPITAL_BASED_OUTPATIENT_CLINIC_OR_DEPARTMENT_OTHER)
Admission: RE | Admit: 2017-05-01 | Discharge: 2017-05-01 | Disposition: A | Discharge status: Home / Self Care | Payer: Medicare Other | Source: Ambulatory Visit | Attending: Internal Medicine | Admitting: Internal Medicine

## 2017-05-01 VITALS — BP 146/64 | HR 77 | Wt 223.4 lb

## 2017-05-01 DIAGNOSIS — Z87891 Personal history of nicotine dependence: Secondary | ICD-10-CM | POA: Diagnosis not present

## 2017-05-01 DIAGNOSIS — N184 Chronic kidney disease, stage 4 (severe): Secondary | ICD-10-CM | POA: Diagnosis not present

## 2017-05-01 DIAGNOSIS — Z7982 Long term (current) use of aspirin: Secondary | ICD-10-CM | POA: Diagnosis not present

## 2017-05-01 DIAGNOSIS — Z8249 Family history of ischemic heart disease and other diseases of the circulatory system: Secondary | ICD-10-CM

## 2017-05-01 DIAGNOSIS — N2581 Secondary hyperparathyroidism of renal origin: Secondary | ICD-10-CM | POA: Diagnosis present

## 2017-05-01 DIAGNOSIS — E1122 Type 2 diabetes mellitus with diabetic chronic kidney disease: Secondary | ICD-10-CM | POA: Diagnosis present

## 2017-05-01 DIAGNOSIS — Z79899 Other long term (current) drug therapy: Secondary | ICD-10-CM

## 2017-05-01 DIAGNOSIS — I255 Ischemic cardiomyopathy: Secondary | ICD-10-CM | POA: Diagnosis present

## 2017-05-01 DIAGNOSIS — E785 Hyperlipidemia, unspecified: Secondary | ICD-10-CM | POA: Diagnosis present

## 2017-05-01 DIAGNOSIS — I25810 Atherosclerosis of coronary artery bypass graft(s) without angina pectoris: Secondary | ICD-10-CM | POA: Diagnosis present

## 2017-05-01 DIAGNOSIS — Z8371 Family history of colonic polyps: Secondary | ICD-10-CM | POA: Diagnosis not present

## 2017-05-01 DIAGNOSIS — N170 Acute kidney failure with tubular necrosis: Secondary | ICD-10-CM

## 2017-05-01 DIAGNOSIS — Z801 Family history of malignant neoplasm of trachea, bronchus and lung: Secondary | ICD-10-CM

## 2017-05-01 DIAGNOSIS — E876 Hypokalemia: Secondary | ICD-10-CM | POA: Diagnosis present

## 2017-05-01 DIAGNOSIS — I2510 Atherosclerotic heart disease of native coronary artery without angina pectoris: Secondary | ICD-10-CM | POA: Diagnosis not present

## 2017-05-01 DIAGNOSIS — I130 Hypertensive heart and chronic kidney disease with heart failure and stage 1 through stage 4 chronic kidney disease, or unspecified chronic kidney disease: Principal | ICD-10-CM | POA: Diagnosis present

## 2017-05-01 DIAGNOSIS — Z808 Family history of malignant neoplasm of other organs or systems: Secondary | ICD-10-CM | POA: Diagnosis not present

## 2017-05-01 DIAGNOSIS — E1140 Type 2 diabetes mellitus with diabetic neuropathy, unspecified: Secondary | ICD-10-CM | POA: Diagnosis present

## 2017-05-01 DIAGNOSIS — I5023 Acute on chronic systolic (congestive) heart failure: Secondary | ICD-10-CM | POA: Diagnosis not present

## 2017-05-01 DIAGNOSIS — J4540 Moderate persistent asthma, uncomplicated: Secondary | ICD-10-CM

## 2017-05-01 DIAGNOSIS — I5042 Chronic combined systolic (congestive) and diastolic (congestive) heart failure: Secondary | ICD-10-CM

## 2017-05-01 DIAGNOSIS — I5033 Acute on chronic diastolic (congestive) heart failure: Secondary | ICD-10-CM | POA: Diagnosis not present

## 2017-05-01 DIAGNOSIS — Z833 Family history of diabetes mellitus: Secondary | ICD-10-CM

## 2017-05-01 DIAGNOSIS — I252 Old myocardial infarction: Secondary | ICD-10-CM | POA: Diagnosis not present

## 2017-05-01 DIAGNOSIS — I959 Hypotension, unspecified: Secondary | ICD-10-CM | POA: Diagnosis not present

## 2017-05-01 DIAGNOSIS — K5900 Constipation, unspecified: Secondary | ICD-10-CM | POA: Diagnosis present

## 2017-05-01 DIAGNOSIS — I5043 Acute on chronic combined systolic (congestive) and diastolic (congestive) heart failure: Secondary | ICD-10-CM | POA: Diagnosis present

## 2017-05-01 DIAGNOSIS — Z95810 Presence of automatic (implantable) cardiac defibrillator: Secondary | ICD-10-CM | POA: Diagnosis not present

## 2017-05-01 DIAGNOSIS — N179 Acute kidney failure, unspecified: Secondary | ICD-10-CM | POA: Diagnosis not present

## 2017-05-01 DIAGNOSIS — R0600 Dyspnea, unspecified: Secondary | ICD-10-CM

## 2017-05-01 DIAGNOSIS — Z96651 Presence of right artificial knee joint: Secondary | ICD-10-CM | POA: Diagnosis present

## 2017-05-01 DIAGNOSIS — E8770 Fluid overload, unspecified: Secondary | ICD-10-CM | POA: Diagnosis not present

## 2017-05-01 DIAGNOSIS — R918 Other nonspecific abnormal finding of lung field: Secondary | ICD-10-CM | POA: Diagnosis not present

## 2017-05-01 DIAGNOSIS — I10 Essential (primary) hypertension: Secondary | ICD-10-CM

## 2017-05-01 HISTORY — DX: Dyspnea, unspecified: R06.00

## 2017-05-01 LAB — BASIC METABOLIC PANEL
Anion gap: 14 (ref 5–15)
BUN: 103 mg/dL — AB (ref 6–20)
CHLORIDE: 89 mmol/L — AB (ref 101–111)
CO2: 28 mmol/L (ref 22–32)
Calcium: 8.6 mg/dL — ABNORMAL LOW (ref 8.9–10.3)
Creatinine, Ser: 3.15 mg/dL — ABNORMAL HIGH (ref 0.61–1.24)
GFR calc Af Amer: 20 mL/min — ABNORMAL LOW (ref >60)
GFR calc non Af Amer: 17 mL/min — ABNORMAL LOW (ref >60)
GLUCOSE: 303 mg/dL — AB (ref 65–99)
POTASSIUM: 3.2 mmol/L — AB (ref 3.5–5.1)
Sodium: 131 mmol/L — ABNORMAL LOW (ref 135–145)

## 2017-05-01 LAB — CBC
HEMATOCRIT: 38.8 % — AB (ref 39.0–52.0)
Hemoglobin: 12 g/dL — ABNORMAL LOW (ref 13.0–17.0)
MCH: 28.6 pg (ref 26.0–34.0)
MCHC: 30.9 g/dL (ref 30.0–36.0)
MCV: 92.6 fL (ref 78.0–100.0)
Platelets: 92 10*3/uL — ABNORMAL LOW (ref 150–400)
RBC: 4.19 MIL/uL — ABNORMAL LOW (ref 4.22–5.81)
RDW: 13.8 % (ref 11.5–15.5)
WBC: 6.8 10*3/uL (ref 4.0–10.5)

## 2017-05-01 LAB — BRAIN NATRIURETIC PEPTIDE: B Natriuretic Peptide: 712.3 pg/mL — ABNORMAL HIGH (ref 0.0–100.0)

## 2017-05-01 LAB — TSH: TSH: 1.275 u[IU]/mL (ref 0.350–4.500)

## 2017-05-01 LAB — AMMONIA: Ammonia: 35 umol/L (ref 9–35)

## 2017-05-01 MED ORDER — ALBUTEROL SULFATE (2.5 MG/3ML) 0.083% IN NEBU: 3.0000 mL | INHALATION_SOLUTION | RESPIRATORY_TRACT | Status: DC | PRN

## 2017-05-01 MED ORDER — HYDRALAZINE HCL 50 MG PO TABS
50.0000 mg | ORAL_TABLET | Freq: Three times a day (TID) | ORAL | Status: DC
  Administered 2017-05-01 – 2017-05-02 (×3): 50 mg via ORAL
  Filled 2017-05-01 (×3): qty 1

## 2017-05-01 MED ORDER — SODIUM CHLORIDE 0.9% FLUSH: 3.0000 mL | INTRAVENOUS | Status: DC | PRN

## 2017-05-01 MED ORDER — GABAPENTIN 300 MG PO CAPS
300.0000 mg | ORAL_CAPSULE | Freq: Every day | ORAL | Status: DC
  Administered 2017-05-01 – 2017-05-05 (×5): 300 mg via ORAL
  Filled 2017-05-01 (×5): qty 1

## 2017-05-01 MED ORDER — TORSEMIDE 20 MG PO TABS: 80.0000 mg | ORAL_TABLET | Freq: Every day | ORAL | 3 refills | Status: DC

## 2017-05-01 MED ORDER — LORATADINE 10 MG PO TABS
10.0000 mg | ORAL_TABLET | Freq: Every day | ORAL | Status: DC
  Administered 2017-05-02 – 2017-05-06 (×5): 10 mg via ORAL
  Filled 2017-05-01 (×5): qty 1

## 2017-05-01 MED ORDER — ATORVASTATIN CALCIUM 10 MG PO TABS
10.0000 mg | ORAL_TABLET | Freq: Every day | ORAL | Status: DC
  Administered 2017-05-02 – 2017-05-06 (×5): 10 mg via ORAL
  Filled 2017-05-01 (×5): qty 1

## 2017-05-01 MED ORDER — AMLODIPINE BESYLATE 10 MG PO TABS
10.0000 mg | ORAL_TABLET | Freq: Every day | ORAL | Status: DC
  Administered 2017-05-02: 10 mg via ORAL
  Filled 2017-05-01: qty 1

## 2017-05-01 MED ORDER — FUROSEMIDE 10 MG/ML IJ SOLN: 80.0000 mg | Freq: Two times a day (BID) | INTRAMUSCULAR | Status: DC

## 2017-05-01 MED ORDER — ASPIRIN EC 81 MG PO TBEC
81.0000 mg | DELAYED_RELEASE_TABLET | Freq: Every day | ORAL | Status: DC
  Administered 2017-05-02 – 2017-05-06 (×5): 81 mg via ORAL
  Filled 2017-05-01 (×5): qty 1

## 2017-05-01 MED ORDER — ACETAMINOPHEN 500 MG PO TABS: 1000.0000 mg | ORAL_TABLET | Freq: Three times a day (TID) | ORAL | Status: DC | PRN

## 2017-05-01 MED ORDER — ACETAMINOPHEN 325 MG PO TABS
650.0000 mg | ORAL_TABLET | ORAL | Status: DC | PRN
  Administered 2017-05-02 – 2017-05-04 (×2): 650 mg via ORAL
  Filled 2017-05-01 (×2): qty 2

## 2017-05-01 MED ORDER — TRIAMCINOLONE ACETONIDE 55 MCG/ACT NA AERO: 2.0000 | INHALATION_SPRAY | Freq: Every day | NASAL | Status: DC

## 2017-05-01 MED ORDER — CARVEDILOL 12.5 MG PO TABS
12.5000 mg | ORAL_TABLET | Freq: Two times a day (BID) | ORAL | Status: DC
  Administered 2017-05-01 – 2017-05-02 (×2): 12.5 mg via ORAL
  Filled 2017-05-01 (×2): qty 1

## 2017-05-01 MED ORDER — ZOLPIDEM TARTRATE 5 MG PO TABS: 5.0000 mg | ORAL_TABLET | Freq: Every evening | ORAL | Status: DC | PRN

## 2017-05-01 MED ORDER — POTASSIUM CHLORIDE CRYS ER 20 MEQ PO TBCR
40.0000 meq | EXTENDED_RELEASE_TABLET | Freq: Two times a day (BID) | ORAL | Status: DC
  Administered 2017-05-01 – 2017-05-06 (×11): 40 meq via ORAL
  Filled 2017-05-01 (×12): qty 2

## 2017-05-01 MED ORDER — DEXTROSE 5 % IV SOLN
120.0000 mg | Freq: Two times a day (BID) | INTRAVENOUS | Status: DC
  Administered 2017-05-01 – 2017-05-06 (×10): 120 mg via INTRAVENOUS
  Filled 2017-05-01 (×2): qty 10
  Filled 2017-05-01: qty 12
  Filled 2017-05-01: qty 10
  Filled 2017-05-01: qty 12
  Filled 2017-05-01: qty 10
  Filled 2017-05-01: qty 12
  Filled 2017-05-01: qty 10
  Filled 2017-05-01: qty 12
  Filled 2017-05-01: qty 10

## 2017-05-01 MED ORDER — SODIUM CHLORIDE 0.9% FLUSH
3.0000 mL | Freq: Two times a day (BID) | INTRAVENOUS | Status: DC
  Administered 2017-05-01 – 2017-05-06 (×10): 3 mL via INTRAVENOUS

## 2017-05-01 MED ORDER — ENOXAPARIN SODIUM 30 MG/0.3ML ~~LOC~~ SOLN
30.0000 mg | SUBCUTANEOUS | Status: DC
  Administered 2017-05-01 – 2017-05-05 (×5): 30 mg via SUBCUTANEOUS
  Filled 2017-05-01 (×5): qty 0.3

## 2017-05-01 MED ORDER — ONDANSETRON HCL 4 MG/2ML IJ SOLN: 4.0000 mg | Freq: Four times a day (QID) | INTRAMUSCULAR | Status: DC | PRN

## 2017-05-01 MED ORDER — SODIUM CHLORIDE 0.9 % IV SOLN: 250.0000 mL | INTRAVENOUS | Status: DC | PRN

## 2017-05-01 NOTE — Patient Instructions (Addendum)
Admit  

## 2017-05-01 NOTE — Progress Notes (Signed)
Patient ID: Shane [name], male   DOB: 04/06/1934, 83 y.o.   MRN: [id number]    Advanced Heart Failure Clinic Note   Date:  05/01/2017   ID:  Shane [name], DOB 05-Oct-1933, MRN [id number]  PCP:  [name], MD  Cardiologist:   Nahser  Nephrologist: Hollie Salk  History of Present Illness: Shane [name] is a 83 y.o. male with CAD s/p CABG, systolic HF EF 19-37% and CKD 4 referred to the HF Clinic by Dr. Acie Fredrickson.   He is retired from [employer]. Had been doing fine until March or April when he started having more DOE. Felt he couldn't breath as deeply. MDT ICD was optimized. Took him off Toprol and switched to carvedilol. Also started Entresto. Began to gain fluid. Then lasix was started. (this was new). Entresto stopped. Creatine went from 1.9 to 3.3 then back to to 2.7.   Today he returns for HF follow up. Earlier this week he saw Dr Acie Fredrickson and he was instructed to take metolazone on Monday-->, increase torsemide to 100 mg daily +metolazone on Tuesday--> Wednesday he was called and instructed to hold torsemide due to elevated serum creatinine. Says he has not felt well over the last 48 hours. Mild dyspnea with exertion. Denies PND/Orthopnea. Weight at home rising. Wears compression stockings. Tries to follow low salt diet and limit fluid intake 2 liters per day.  Taking all medications.   ICD interrogated personally: Trending up. Impedance down. Activity ~3 hours per day. No Vt.   Past Medical History:  Diagnosis Date  . Abnormal nuclear cardiac imaging test Nov 2010   moderate area of infarct in the inferior wall with only minimal reversibility and EF of 28%  . AICD (automatic cardioverter/defibrillator) present   . Allergic rhinitis 01-08-13   Uses nebulizer for chronic sinus issues and Mucinex.  . Arthritis    osteoarthritis   . Asthma    Extrinic  . Blood transfusion    ? at time of bypass surgery   . BPH (benign prostatic hyperplasia)   . Cellulitis of arm, left    MSSA  . CHF (congestive heart failure) ([place])   . CKD (chronic kidney disease), stage III    "was told due to meds he takes"-no Renal workup done  . Colon polyps    adenomatous  . Coronary artery disease    remote CABG in 1985, cath in 2003 by Dr. Lia Foyer with no PCI, last nuclear in 2010 showing scar and EF of 28%.   . Diabetes mellitus   . Diabetic neuropathy ([place]) 04/25/2017  . Dyslipidemia   . Gait abnormality 04/25/2017  . Glaucoma 01-08-13   tx. eye drops  . HOH (hard of hearing)   . HTN (hypertension)   . Hypercholesterolemia   . Left ventricular dysfunction    28% per nuclear in 2010 and 35 to 40% per echo in 2010  . Myocardial infarction ([place])    1985  . Neuromuscular disorder ([place])    legs mild paralysis-able to walk"nerve damage"-legs- left leg brace   . Neuropathy   . Pneumonia    hx of several times years ago   . Spinal stenosis     Past Surgical History:  Procedure Laterality Date  . A/V FISTULAGRAM Right 10/17/2016   Procedure: A/V Fistulagram;  Surgeon: Conrad Nenana, MD;  Location: [facility];  Service: Cardiovascular;  Laterality: Right;  . BACK SURGERY     hx of back surgery x 4   .  BASCILIC VEIN TRANSPOSITION Right 09/09/2016   Procedure: BASCILIC VEIN TRANSPOSITION Right Arm;  Surgeon: Rosetta Posner, MD;  Location: [facility];  Service: Vascular;  Laterality: Right;  . BI-VENTRICULAR IMPLANTABLE CARDIOVERTER DEFIBRILLATOR N/A 10/10/2014   Procedure: BI-VENTRICULAR IMPLANTABLE CARDIOVERTER DEFIBRILLATOR  (CRT-D);  Surgeon: [name], MD;  Location: [facility] CATH LAB;  Service: Cardiovascular;  Laterality: N/A;  . CARDIAC CATHETERIZATION  2003  . Cataract      recent cataract surgery 6'14  . COLONOSCOPY N/A 01/25/2013   Procedure: COLONOSCOPY;  Surgeon: Irene Shipper, MD;  Location: [facility];  Service: Endoscopy;  Laterality: N/A;  . CORONARY ARTERY BYPASS GRAFT  1985   x 5 vessels  . LUMBAR DISC SURGERY    . PERIPHERAL VASCULAR BALLOON ANGIOPLASTY Right  10/17/2016   Procedure: Peripheral Vascular Balloon Angioplasty;  Surgeon: Conrad Blossburg, MD;  Location: [facility];  Service: Cardiovascular;  Laterality: Right;  ARM VEINOUS AND CENTRAL VEIN  . PR POLYSOM 6/>YRS SLEEP 4/> ADDL PARAM ATTND  12/07/2015  . PROSTATE SURGERY    . TOTAL KNEE ARTHROPLASTY  08/01/2011   Procedure: TOTAL KNEE ARTHROPLASTY;  Surgeon: Johnn Hai;  Location: [facility];  Service: Orthopedics;  Laterality: Right;    Current Outpatient Prescriptions  Medication Sig Dispense Refill  . acetaminophen (TYLENOL) 500 MG tablet Take 1,000 mg by mouth every 8 (eight) hours as needed (for pain.).     Marland Kitchen albuterol (PROVENTIL HFA;VENTOLIN HFA) 108 (90 Base) MCG/ACT inhaler Inhale 2 puffs into the lungs every 4 (four) hours as needed for wheezing or shortness of breath (cough, shortness of breath or wheezing.). 1 Inhaler 1  . amLODipine (NORVASC) 10 MG tablet Take 1 tablet (10 mg total) by mouth daily. 90 tablet 2  . aspirin EC 81 MG tablet Take 81 mg by mouth daily.     Marland Kitchen atorvastatin (LIPITOR) 10 MG tablet Take 1 tablet (10 mg total) by mouth daily. 90 tablet 3  . carvedilol (COREG) 25 MG tablet Take 1 tablet (25 mg total) by mouth 2 (two) times daily. 180 tablet 2  . cetirizine (ZYRTEC) 10 MG tablet Take 10 mg by mouth at bedtime as needed for allergies.    . CIALIS 20 MG tablet Take 20 mg by mouth daily as needed for erectile dysfunction. DO NOT EXCEED 2 DOSES WITHIN 7 DAYS    . diphenhydrAMINE (BENADRYL) 25 mg capsule Take 25 mg by mouth every 6 (six) hours as needed for allergies.     . fluocinonide cream (LIDEX) 2.70 % Apply 1 application topically 2 (two) times daily as needed (for itchy rash).     . gabapentin (NEURONTIN) 300 MG capsule Take 300 mg by mouth at bedtime.    . hydrALAZINE (APRESOLINE) 50 MG tablet Take 1 tablet (50 mg total) by mouth 3 (three) times daily. 90 tablet 0  . metolazone (ZAROXOLYN) 5 MG tablet Take 1 pill daily if weight exceeds 214 lb 60 tablet  3  . potassium chloride (MICRO-K) 10 MEQ CR capsule Take 10 mEq by mouth as needed.    . testosterone cypionate (DEPOTESTOTERONE CYPIONATE) 100 MG/ML injection Inject 400 mg into the muscle every 21 ( twenty-one) days. For IM use only    . torsemide (DEMADEX) 20 MG tablet Take 3 tablets (60 mg total) by mouth daily. 270 tablet 3  . triamcinolone (NASACORT AQ) 55 MCG/ACT AERO nasal inhaler Place 2 sprays into the nose daily.    Marland Kitchen zolpidem (AMBIEN) 10 MG tablet  TAKE 1/2 TO 1 (ONE-HALF TO ONE) TABLET BY MOUTH AT BEDTIME AS NEEDED FOR SLEEP 30 tablet 1   Current Facility-Administered Medications  Medication Dose Route Frequency Provider Last Rate Last Dose  . omalizumab Arvid Right) injection 300 mg  300 mg Subcutaneous Q14 Days Javier Glazier, MD   300 mg at 04/25/17 1213   Allergies:   Codeine; Hydrocodone; Seasonal ic [cholestatin]; and Azithromycin   Social History:  The patient  reports that he quit smoking about 58 years ago. His smoking use included Cigarettes, Pipe, and Cigars. He has a 5.00 pack-year smoking history. He has never used smokeless tobacco. He reports that he does not drink alcohol or use drugs.   Family History:  The patient's family history includes Bone cancer in his brother; Colon polyps in his father; Diabetes in his sister; Heart attack in his father; Heart disease in his brother and father; Lung cancer in his mother.   Review of systems complete and found to be negative unless listed in HPI.    PHYSICAL EXAM: Vitals:   05/01/17 1033  BP: (!) 146/64  Pulse: 77  SpO2: 98%  Weight: 223 lb 6 oz (101.3 kg)   Wt Readings from Last 3 Encounters:  05/01/17 223 lb 6 oz (101.3 kg)  04/28/17 222 lb 1.9 oz (100.8 kg)  04/25/17 218 lb (98.9 kg)        ReDS Vest - 05/01/17 1100      ReDS Vest   MR  No   Estimated volume prior to reading Med   Fitting Posture Sitting   Height Marker Tall   Ruler Value [place]   ReDS Value 46     General:   Appears fatigued. No resp difficulty HEENT: normal Neck: supple. JVP ~10. Carotids 2+ bilat; no bruits. No lymphadenopathy or thryomegaly appreciated. Cor: PMI nondisplaced. Regular rate & rhythm. No rubs, gallops. 2/6 TR  Lungs: clear Abdomen: soft, nontender, nondistended. No hepatosplenomegaly. No bruits or masses. Good bowel sounds. Extremities: no cyanosis, clubbing, rash, Rand LLE compression stockings. R and LLE 2+ edema. RUE AVF Neuro: alert & orientedx3, cranial nerves grossly intact. moves all 4 extremities w/o difficulty. Affect flat   ASSESSMENT AND PLAN: 1. Acute/Chronic systolic congestive heart failure-due to ischemic cardiomyopathy. EF 40-45% echo 4/17. S/p ICD. NYHA IIIb. Renal function worsening. Will need admit for IV lasix.  - Continue coreg 25 mg BID.  2. Coronary artery disease - history of remote coronary artery bypass grafting 1985 -No S/S ischemia - Last cath 2003 LM and RCA occluded. LIMA to LAD ok. RIMA to OM ok  SVG to PDA occluded. EF 45% 3. CKD 4- creatinine baseline 2.5-2.8 --followed by Dr. Hollie Salk in Nephrology.  -- Has AVF in place.  4. Hyperlipidemia - Per PCP 5. Diabetes mellitus - Per PCP  6. Mild carotid artery disease.   - Per Dr. Acie Fredrickson.   7. HTN - Continue current regimen. With CKD will not push down any furher.    Check CBC, BMET, TSH, BNP . Admit with IV lasix. Dr Haroldine Laws discussed with Dr Hollie Salk.      Darrick Grinder, NP  10:30 AM

## 2017-05-01 NOTE — H&P (Signed)
Advanced Heart Failure Team History and Physical Note   Primary Physician:  Dr [name] Primary Cardiologist:  Dr Acie Fredrickson HF MD: Dr Haroldine Laws  Reason for Admission: A/C Diastolic Heart Failure    HPI:   Shane [name] is an 83 year old with CAD s/p CABG, medtronic ICD, systolic HF EF 44-81% and CKD 4.   Today he returned to the  HF clinic for follow up with his wife. Earlier this week he saw Dr Acie Fredrickson and he was instructed to take metolazone on Monday-->, increase torsemide to 100 mg daily +metolazone on Tuesday--> Wednesday he was called and instructed to hold torsemide due to elevated serum creatinine. Says he has not felt well over the last 48 hours. Mild dyspnea with exertion. Sleeping most of the day. Denies PND/Orthopnea. Weight at home rising. Wears compression stockings. Tries to follow low salt diet and limit fluid intake 2 liters per day.  Taking all medications.   ICD interrogated personally in HF clinic: Trending up. Impedance down. Activity ~3 hours per day. No Vt.   ECHO 11/2015 EF 40-45%    Review of Systems: [y] = yes, [ ]  = no   General: Weight gain [Y ]; Weight loss [ ] ; Anorexia [ ] ; Fatigue [Y ]; Fever [ ] ; Chills [ ] ; Weakness [ ]   Cardiac: Chest pain/pressure [ ] ; Resting SOB [ ] ; Exertional SOB [Y ]; Orthopnea [ ] ; Pedal Edema [ Y]; Palpitations [ ] ; Syncope [ ] ; Presyncope [ ] ; Paroxysmal nocturnal dyspnea[ ]   Pulmonary: Cough [ ] ; Wheezing[ ] ; Hemoptysis[ ] ; Sputum [ ] ; Snoring [ ]   GI: Vomiting[ ] ; Dysphagia[ ] ; Melena[ ] ; Hematochezia [ ] ; Heartburn[ ] ; Abdominal pain [ ] ; Constipation [ ] ; Diarrhea [ ] ; BRBPR [ ]   GU: Hematuria[ ] ; Dysuria [ ] ; Nocturia[ ]   Vascular: Pain in legs with walking [ ] ; Pain in feet with lying flat [ ] ; Non-healing sores [ ] ; Stroke [ ] ; TIA [ ] ; Slurred speech [ ] ;  Neuro: Headaches[ ] ; Vertigo[ ] ; Seizures[ ] ; Paresthesias[ ] ;Blurred vision [ ] ; Diplopia [ ] ; Vision changes [ ]   Ortho/Skin: Arthritis [ ] ; Joint pain [ Y]; Muscle  pain [ ] ; Joint swelling [ ] ; Back Pain [ Y]; Rash [ ]   Psych: Depression[ ] ; Anxiety[ ]   Heme: Bleeding problems [ ] ; Clotting disorders [ ] ; Anemia [ ]   Endocrine: Diabetes [ ] ; Thyroid dysfunction[ ]    Home Medications Prior to Admission medications   Medication Sig Start Date End Date Taking? Authorizing Provider  acetaminophen (TYLENOL) 500 MG tablet Take 1,000 mg by mouth every 8 (eight) hours as needed (for pain.).     [provider]  albuterol (PROVENTIL HFA;VENTOLIN HFA) 108 (90 Base) MCG/ACT inhaler Inhale 2 puffs into the lungs every 4 (four) hours as needed for wheezing or shortness of breath (cough, shortness of breath or wheezing.). 02/10/17   Elby Beck, FNP  amLODipine (NORVASC) 10 MG tablet Take 1 tablet (10 mg total) by mouth daily. 09/10/16   Nahser, Wonda Cheng, MD  aspirin EC 81 MG tablet Take 81 mg by mouth daily.     [provider]  atorvastatin (LIPITOR) 10 MG tablet Take 1 tablet (10 mg total) by mouth daily. 05/02/16   Sandon Yoho, Shaune Pascal, MD  carvedilol (COREG) 25 MG tablet Take 1 tablet (25 mg total) by mouth 2 (two) times daily. 01/03/17   [name], MD  cetirizine (ZYRTEC) 10 MG tablet Take 10 mg by mouth at bedtime as  needed for allergies.    [provider]  CIALIS 20 MG tablet Take 20 mg by mouth daily as needed for erectile dysfunction. DO NOT EXCEED 2 DOSES WITHIN 7 DAYS 09/20/11   [provider]  diphenhydrAMINE (BENADRYL) 25 mg capsule Take 25 mg by mouth every 6 (six) hours as needed for allergies.     [provider]  fluocinonide cream (LIDEX) 1.01 % Apply 1 application topically 2 (two) times daily as needed (for itchy rash).  03/16/15   [provider]  gabapentin (NEURONTIN) 300 MG capsule Take 300 mg by mouth at bedtime.    [provider]  hydrALAZINE (APRESOLINE) 50 MG tablet Take 1 tablet (50 mg total) by mouth 3 (three) times daily. 12/04/16   Shirley Friar, PA-C    metolazone (ZAROXOLYN) 5 MG tablet Take 1 pill daily if weight exceeds 214 lb 04/28/17   Nahser, Wonda Cheng, MD  potassium chloride (MICRO-K) 10 MEQ CR capsule Take 10 mEq by mouth as needed.    [provider]  testosterone cypionate (DEPOTESTOTERONE CYPIONATE) 100 MG/ML injection Inject 400 mg into the muscle every 21 ( twenty-one) days. For IM use only    [provider]  torsemide (DEMADEX) 20 MG tablet Take 4 tablets (80 mg total) by mouth daily. 05/01/17   Clegg, Amy D, NP  triamcinolone (NASACORT AQ) 55 MCG/ACT AERO nasal inhaler Place 2 sprays into the nose daily.    [provider]  zolpidem (AMBIEN) 10 MG tablet TAKE 1/2 TO 1 (ONE-HALF TO ONE) TABLET BY MOUTH AT BEDTIME AS NEEDED FOR SLEEP 04/04/17   [name], MD    Past Medical History: Past Medical History:  Diagnosis Date  . Abnormal nuclear cardiac imaging test Nov 2010   moderate area of infarct in the inferior wall with only minimal reversibility and EF of 28%  . AICD (automatic cardioverter/defibrillator) present   . Allergic rhinitis 01-08-13   Uses nebulizer for chronic sinus issues and Mucinex.  . Arthritis    osteoarthritis   . Asthma    Extrinic  . Blood transfusion    ? at time of bypass surgery   . BPH (benign prostatic hyperplasia)   . Cellulitis of arm, left    MSSA  . CHF (congestive heart failure) ([place])   . CKD (chronic kidney disease), stage III    "was told due to meds he takes"-no Renal workup done  . Colon polyps    adenomatous  . Coronary artery disease    remote CABG in 1985, cath in 2003 by Dr. Lia Foyer with no PCI, last nuclear in 2010 showing scar and EF of 28%.   . Diabetes mellitus   . Diabetic neuropathy ([place]) 04/25/2017  . Dyslipidemia   . Gait abnormality 04/25/2017  . Glaucoma 01-08-13   tx. eye drops  . HOH (hard of hearing)   . HTN (hypertension)   . Hypercholesterolemia   . Left ventricular dysfunction    28% per nuclear in 2010 and 35 to 40% per echo in  2010  . Myocardial infarction ([place])    1985  . Neuromuscular disorder ([place])    legs mild paralysis-able to walk"nerve damage"-legs- left leg brace   . Neuropathy   . Pneumonia    hx of several times years ago   . Spinal stenosis     Past Surgical History: Past Surgical History:  Procedure Laterality Date  . A/V FISTULAGRAM Right 10/17/2016   Procedure: A/V Fistulagram;  Surgeon:  Conrad Miles, MD;  Location: [facility];  Service: Cardiovascular;  Laterality: Right;  . BACK SURGERY     hx of back surgery x 4   . BASCILIC VEIN TRANSPOSITION Right 09/09/2016   Procedure: BASCILIC VEIN TRANSPOSITION Right Arm;  Surgeon: Rosetta Posner, MD;  Location: [facility] OR;  Service: Vascular;  Laterality: Right;  . BI-VENTRICULAR IMPLANTABLE CARDIOVERTER DEFIBRILLATOR N/A 10/10/2014   Procedure: BI-VENTRICULAR IMPLANTABLE CARDIOVERTER DEFIBRILLATOR  (CRT-D);  Surgeon: [name], MD;  Location: [facility] CATH LAB;  Service: Cardiovascular;  Laterality: N/A;  . CARDIAC CATHETERIZATION  2003  . Cataract      recent cataract surgery 6'14  . COLONOSCOPY N/A 01/25/2013   Procedure: COLONOSCOPY;  Surgeon: Irene Shipper, MD;  [facility];  Service: Endoscopy;  Laterality: N/A;  . CORONARY ARTERY BYPASS GRAFT  1985   x 5 vessels  . LUMBAR DISC SURGERY    . PERIPHERAL VASCULAR BALLOON ANGIOPLASTY Right 10/17/2016   Procedure: Peripheral Vascular Balloon Angioplasty;  Surgeon: Conrad St. Johns, MD;  Location: [facility];  Service: Cardiovascular;  Laterality: Right;  ARM VEINOUS AND CENTRAL VEIN  . PR POLYSOM 6/>YRS SLEEP 4/> ADDL PARAM ATTND  12/07/2015  . PROSTATE SURGERY    . TOTAL KNEE ARTHROPLASTY  08/01/2011   Procedure: TOTAL KNEE ARTHROPLASTY;  Surgeon: Johnn Hai;  [facility];  Service: Orthopedics;  Laterality: Right;    Family History:  Family History  Problem Relation Age of Onset  . Heart attack Father   . Heart disease Father   . Colon polyps Father   . Lung cancer  Mother   . Diabetes Sister        x 2  . Heart disease Brother        x 2  . Bone cancer Brother   . Lung disease Neg Hx     Social History: Social History   Social History  . Marital status: Married    Spouse name: N/A  . Number of children: 2  . Years of education: N/A   Occupational History  . pastor    Social History Main Topics  . Smoking status: Former Smoker    Packs/day: 1.00    Years: 5.00    Types: Cigarettes, Pipe, Cigars    Quit date: 08/05/1958  . Smokeless tobacco: Never Used  . Alcohol use No  . Drug use: No  . Sexual activity: Not Currently   Other Topics Concern  . Not on file   Social History Narrative   Originally from [place]. He has always lived in [place]. Prior travel to [place], [place], [place], [place] ,[place], & [place]. 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 Has adopted children. Has a dog currently. No bird exposure. No mold exposure. Enjoys reading & traveling.    Allergies:  Allergies  Allergen Reactions  . Codeine Other (See Comments)    anxiety  . Hydrocodone Other (See Comments)    anxiety  . Seasonal Ic [Cholestatin]   . Azithromycin Rash    Objective:    Vital Signs:   Temp:  [98.4 F (36.9 C)] 98.4 F (36.9 C) (09/27 1431) Pulse Rate:  [74] 74 (09/27 1431) Resp:  [20] 20 (09/27 1431) BP: (134)/(86) 134/86 (09/27 1431) SpO2:  [94 %] 94 % (09/27 1431) Weight:  [218 lb 8 oz (99.1 kg)] 218 lb 8 oz (99.1 kg) (09/27 1435)   Filed Weights   05/01/17 1435  Weight: 218  lb 8 oz (99.1 kg)     Physical Exam    General:  Appears Fatigued.  No respiratory difficulty HEENT: Normal Neck: Supple. JVP ~10 Carotids 2+ bilat; no bruits. No lymphadenopathy or thyromegaly appreciated. Cor: PMI nondisplaced. Regular rate & rhythm. No rubs, gallops or murmurs. Lungs: Clear on room air.  Abdomen: Soft, nontender, nondistended. No hepatosplenomegaly. No bruits or masses. Good bowel sounds. Extremities: No cyanosis, clubbing,  rash, R and LLE 2+ edema. LUE AV fistula.  Neuro: Alert & oriented x 3, cranial nerves grossly intact. moves all 4 extremities w/o difficulty. Affect flat.   Telemetry     EKG  N/A  Labs     Basic Metabolic Panel:  Recent Labs Lab 04/28/17 1610 05/01/17 1108  NA 138 131*  K 3.3* 3.2*  CL 93* 89*  CO2 28 28  GLUCOSE 124* 303*  BUN 90* 103*  CREATININE 2.96* 3.15*  CALCIUM 9.1 8.6*    Liver Function Tests:  Recent Labs Lab 04/25/17 1002  PROT 6.0   No results for input(s): LIPASE, AMYLASE in the last 168 hours.  Recent Labs Lab 05/01/17 1108  AMMONIA 35    CBC:  Recent Labs Lab 05/01/17 1108  WBC 6.8  HGB 12.0*  HCT 38.8*  MCV 92.6  PLT 92*    Cardiac Enzymes: No results for input(s): CKTOTAL, CKMB, CKMBINDEX, TROPONINI in the last 168 hours.  BNP: BNP (last 3 results)  Recent Labs  05/01/17 1108  BNP 712.3*    ProBNP (last 3 results) No results for input(s): PROBNP in the last 8760 hours.   CBG: No results for input(s): GLUCAP in the last 168 hours.  Coagulation Studies: No results for input(s): LABPROT, INR in the last 72 hours.  Imaging:  No results found.   Patient Profile  Shane [name] is an 83 year old with CAD s/p CABG, systolic HF EF 99-83% and CKD 4 admitted with marked volume overload and worsening renal function. .    Assessment/Plan   1. Acute/Chronic systolic congestive heart failure-due to ischemic cardiomyopathy. EF 40-45% echo 4/17. S/p ICD. NYHA IIIb. Renal function worsening. Will need admit for IV lasix.  - Continue coreg 25 mg BID.  2. Coronary artery disease - history of remote coronary artery bypass grafting 1985 -No S/S ischemia - Last cath 2003 LM and RCA occluded. LIMA to LAD ok. RIMA to OM ok  SVG to PDA occluded. EF 45% 3. Acute on CKD 4- creatinine baseline 2.5-2.8 --followed by Dr. Hollie Salk in Nephrology.  -- Has AVF in place.  4. Hyperlipidemia - Per PCP 5. Diabetes mellitus - Per PCP  6.  Mild carotid artery disease.   - Per Dr. Acie Fredrickson.   7. HTN - Continue current regimen. With CKD will not push down any furher.    Labs reviewed from HF from HF clinic. Renal function worse. Admit with IV lasix. Start 120 mg IV twice a day. May need to add inotropes.   Dr Haroldine Laws discussed with Dr Hollie Salk.    Darrick Grinder, NP 05/01/2017, 2:06 PM  Advanced Heart Failure Team Pager [phone] (M-F; 7a - 4p)  Please contact [facility] for night-coverage after hours (4p -7a ) and weekends on [email or web address]   Patient seen and examined with Darrick Grinder, NP. We discussed all aspects of the encounter. I agree with the assessment and plan as stated above.   He has worsening cardiorenal syndrome with significant volume overload on exam, by ReDS and Optivol interrogation. I discussed with  Dr. Hollie Salk. We will admit for trial of IV lasix. If fails will need initiation of HD.  CAD stable without s/s ischemia.   Glori Bickers, MD  3:31 PM

## 2017-05-01 NOTE — Addendum Note (Signed)
Encounter addended by: Effie Berkshire, RN on: 05/01/2017 12:12 PM<BR>    Actions taken: Order list changed, Diagnosis association updated

## 2017-05-02 ENCOUNTER — Inpatient Hospital Stay (HOSPITAL_COMMUNITY): Payer: Medicare Other

## 2017-05-02 LAB — BASIC METABOLIC PANEL
Anion gap: 12 (ref 5–15)
BUN: 108 mg/dL — AB (ref 6–20)
CALCIUM: 8.6 mg/dL — AB (ref 8.9–10.3)
CHLORIDE: 92 mmol/L — AB (ref 101–111)
CO2: 30 mmol/L (ref 22–32)
CREATININE: 3.32 mg/dL — AB (ref 0.61–1.24)
GFR calc Af Amer: 19 mL/min — ABNORMAL LOW (ref >60)
GFR calc non Af Amer: 16 mL/min — ABNORMAL LOW (ref >60)
GLUCOSE: 158 mg/dL — AB (ref 65–99)
Potassium: 3.8 mmol/L (ref 3.5–5.1)
Sodium: 134 mmol/L — ABNORMAL LOW (ref 135–145)

## 2017-05-02 LAB — GLUCOSE, CAPILLARY
GLUCOSE-CAPILLARY: 120 mg/dL — AB (ref 65–99)
GLUCOSE-CAPILLARY: 145 mg/dL — AB (ref 65–99)

## 2017-05-02 LAB — MAGNESIUM: Magnesium: 2.5 mg/dL — ABNORMAL HIGH (ref 1.7–2.4)

## 2017-05-02 MED ORDER — INSULIN ASPART 100 UNIT/ML ~~LOC~~ SOLN
0.0000 [IU] | Freq: Three times a day (TID) | SUBCUTANEOUS | Status: DC
  Administered 2017-05-03 (×2): 2 [IU] via SUBCUTANEOUS
  Administered 2017-05-04: 5 [IU] via SUBCUTANEOUS
  Administered 2017-05-05: 1 [IU] via SUBCUTANEOUS
  Administered 2017-05-05: 2 [IU] via SUBCUTANEOUS

## 2017-05-02 MED ORDER — CARVEDILOL 6.25 MG PO TABS
6.2500 mg | ORAL_TABLET | Freq: Two times a day (BID) | ORAL | Status: DC
  Administered 2017-05-02 – 2017-05-06 (×8): 6.25 mg via ORAL
  Filled 2017-05-02 (×8): qty 1

## 2017-05-02 MED ORDER — HYDRALAZINE HCL 25 MG PO TABS
25.0000 mg | ORAL_TABLET | Freq: Three times a day (TID) | ORAL | Status: DC
  Administered 2017-05-02 – 2017-05-06 (×12): 25 mg via ORAL
  Filled 2017-05-02 (×12): qty 1

## 2017-05-02 NOTE — Progress Notes (Signed)
Inpatient Diabetes Program Recommendations  AACE/ADA: New Consensus Statement on Inpatient Glycemic Control (2015)  Target Ranges:  Prepandial:   less than 140 mg/dL      Peak postprandial:   less than 180 mg/dL (1-2 hours)      Critically ill patients:  140 - 180 mg/dL   Lab Results  Component Value Date   GLUCAP 101 (H) 10/11/2014   HGBA1C 7.0 (H) 03/04/2017    Review of Glycemic ControlResults for DERION, KREITER (MRN [id number]) as of 05/02/2017 10:11  Ref. Range 05/01/2017 11:08 05/02/2017 04:27  Glucose Latest Ref Range: 65 - 99 mg/dL 303 (H) 158 (H)   Diabetes history: Type 2 diabetes Outpatient Diabetes medications:None listed  Inpatient Diabetes Program Recommendations:   Text page sent requesting Novolog correction tid with meals.  Orders placed by MD.    Thanks, Adah Perl, RN, BC-ADM Inpatient Diabetes Coordinator Pager [phone] (8a-5p)

## 2017-05-02 NOTE — Care Management Note (Signed)
Case Management Note  Patient Details  Name: Shane [name] MRN: [id number] Date of Birth: March 10, 1934  Subjective/Objective:    CHF            Action/Plan: Patient lives at home with his spouse; Primary Physician:  Dr [name]; has private insurance with Medicare / [employer] with prescription drug coverage; CM following for DCP  Expected Discharge Date:   possibly 05/05/2017             Expected Discharge Plan:  [place]; awaiting on Physical Therapy eval for disposition needs  In-House Referral:   [facility]  Discharge planning Services  CM Consult  Status of Service:  In process, will continue to follow  Sherrilyn Rist [phone] 05/02/2017, 1:25 PM

## 2017-05-02 NOTE — Consult Note (Signed)
[facility] Renal Consultation Note  Consulting MD- Bensimohn Indication for Consultation: Advanced chronic kidney disease and volume overload  HPI: Shane [name] is a 83 y.o. male with past medical history significant for hypertension, diabetes mellitus, coronary artery disease status post CABG also with an ICD and systolic heart failure with ejection fraction of 40-45% as well as advanced CKD followed by Dr. Madelon Lips at  [facility] with progressive CKD. He is status post AV fistula placement  In February 2018 in preparation for dialysis. He has been to the education classes, he actually wanted to nocturnal hemo-versus home hemo but understands that he will need to be in Center first. As above, he's had progressive CK D. Creatinine in the twos now up to the threes. He was also found to be volume overloaded, was challenged with metolazone and unfortunately he became uremic. Given this pattern of late, it is felt that he may benefit from initiation of hemodialysis. Patient is here with wife as well as son and they are agreeable   Creat  Date/Time Value Ref Range Status  07/01/2016 09:07 AM 3.09 (H) 0.70 - 1.11 mg/dL Final    Comment:      For patients > or = 81 years of age: The upper reference limit for Creatinine is approximately 13% higher for people identified as African-American.     05/01/2016 11:21 AM 2.64 (H) 0.70 - 1.11 mg/dL Final    Comment:      For patients > or = 81 years of age: The upper reference limit for Creatinine is approximately 13% higher for people identified as African-American.     03/26/2016 03:21 PM 2.28 (H) 0.70 - 1.11 mg/dL Final    Comment:      For patients > or = 81 years of age: The upper reference limit for Creatinine is approximately 13% higher for people identified as African-American.     02/15/2016 04:07 PM 2.72 (H) 0.70 - 1.11 mg/dL Final    Comment:      For patients > or = 81 years of age: The upper reference limit  for Creatinine is approximately 13% higher for people identified as African-American.     02/05/2016 03:32 PM 2.43 (H) 0.70 - 1.11 mg/dL Final    Comment:      For patients > or = 81 years of age: The upper reference limit for Creatinine is approximately 13% higher for people identified as African-American.     01/31/2016 04:29 PM 3.36 (H) 0.70 - 1.11 mg/dL Final    Comment:      For patients > or = 81 years of age: The upper reference limit for Creatinine is approximately 13% higher for people identified as African-American.     11/16/2015 11:04 AM 1.93 (H) 0.70 - 1.11 mg/dL Final  11/06/2015 03:07 PM 2.21 (H) 0.70 - 1.11 mg/dL Final  11/02/2015 12:24 PM 2.29 (H) 0.70 - 1.11 mg/dL Final  09/18/2015 09:55 AM 2.14 (H) 0.70 - 1.11 mg/dL Final  08/11/2015 11:44 AM 2.07 (H) 0.70 - 1.11 mg/dL Final   Creatinine, Ser  Date/Time Value Ref Range Status  05/02/2017 04:27 AM 3.32 (H) 0.61 - 1.24 mg/dL Final  05/01/2017 11:08 AM 3.15 (H) 0.61 - 1.24 mg/dL Final  04/28/2017 04:10 PM 2.96 (H) 0.76 - 1.27 mg/dL Final  03/04/2017 01:57 PM 2.39 (H) 0.40 - 1.50 mg/dL Final  01/08/2017 03:32 PM 2.98 (H) 0.61 - 1.24 mg/dL Final  12/10/2016 02:43 PM 2.66 (H) 0.61 - 1.24 mg/dL Final  12/03/2016 11:13 AM 2.70 (H) 0.61 - 1.24 mg/dL Final  11/04/2016 03:27 PM 3.27 (H) 0.61 - 1.24 mg/dL Final  10/18/2016 06:15 PM 3.12 (H) 0.61 - 1.24 mg/dL Final  10/17/2016 11:01 AM 2.70 (H) 0.61 - 1.24 mg/dL Final  10/02/2016 03:01 PM 2.43 (H) 0.76 - 1.27 mg/dL Final  06/07/2016 12:33 PM 2.59 (H) 0.61 - 1.24 mg/dL Final  04/24/2016 03:21 PM 2.46 (H) 0.61 - 1.24 mg/dL Final  03/07/2016 01:00 PM 2.47 (H) 0.61 - 1.24 mg/dL Final  02/03/2015 09:48 AM 2.07 (H) 0.40 - 1.50 mg/dL Final  10/10/2014 05:22 AM 1.77 (H) 0.50 - 1.35 mg/dL Final  10/07/2014 03:30 AM 1.79 (H) 0.50 - 1.35 mg/dL Final  10/06/2014 08:15 AM 1.80 (H) 0.50 - 1.35 mg/dL Final  10/06/2014 02:39 AM 1.97 (H) 0.50 - 1.35 mg/dL Final  10/05/2014 09:53  PM 1.89 (H) 0.50 - 1.35 mg/dL Final  07/26/2014 08:56 AM 1.9 (H) 0.4 - 1.5 mg/dL Final  03/28/2014 03:10 PM 2.2 (H) 0.4 - 1.5 mg/dL Final  01/04/2014 09:24 AM 2.0 (H) 0.4 - 1.5 mg/dL Final  02/23/2013 12:05 PM 1.7 (H) 0.4 - 1.5 mg/dL Final  10/19/2012 01:34 PM 2.2 (H) 0.4 - 1.5 mg/dL Final  06/17/2012 01:57 PM 1.9 (H) 0.4 - 1.5 mg/dL Final  12/16/2011 09:58 AM 1.8 (H) 0.4 - 1.5 mg/dL Final  09/23/2011 10:21 AM 1.5 0.4 - 1.5 mg/dL Final  08/04/2011 04:40 AM 1.48 (H) 0.50 - 1.35 mg/dL Final  08/03/2011 05:27 AM 1.66 (H) 0.50 - 1.35 mg/dL Final  08/02/2011 04:26 AM 1.89 (H) 0.50 - 1.35 mg/dL Final  07/23/2011 03:10 PM 1.80 (H) 0.50 - 1.35 mg/dL Final  06/19/2011 08:51 AM 1.7 (H) 0.4 - 1.5 mg/dL Final  03/25/2011 02:49 PM 1.5 0.4 - 1.5 mg/dL Final  02/14/2011 05:20 AM 1.57 (H) 0.50 - 1.35 mg/dL Final    Comment:    **Please note change in reference range.**  02/13/2011 05:23 AM 1.47 (H) 0.50 - 1.35 mg/dL Final    Comment:    **Please note change in reference range.**  02/12/2011 05:00 AM 1.50 (H) 0.50 - 1.35 mg/dL Final    Comment:    **Please note change in reference range.**  02/11/2011 05:08 AM 1.48 (H) 0.50 - 1.35 mg/dL Final    Comment:    **Please note change in reference range.**  02/10/2011 05:10 AM 1.62 (H) 0.50 - 1.35 mg/dL Final    Comment:    **Please note change in reference range.**  02/09/2011 05:40 AM 1.71 (H) 0.50 - 1.35 mg/dL Final    Comment:    **Please note change in reference range.**  02/08/2011 04:24 PM 2.08 (H) 0.50 - 1.35 mg/dL Final    Comment:    **Please note change in reference range.**  11/06/2010 12:29 PM 2.0 (H) 0.4 - 1.5 mg/dL Final  02/03/2008 02:37 PM   Final   1.3 QA FLAGS MODIFIED BY DEMOGRAPHIC UPDATE ON 07/01 AT 1701  11/10/2007 01:25 PM 1.12  Final     PMHx:   Past Medical History:  Diagnosis Date  . Abnormal nuclear cardiac imaging test Nov 2010   moderate area of infarct in the inferior wall with only minimal reversibility and EF  of 28%  . AICD (automatic cardioverter/defibrillator) present   . Allergic rhinitis 01-08-13   Uses nebulizer for chronic sinus issues and Mucinex.  . Arthritis    osteoarthritis   . Asthma    Extrinic  . Blood transfusion    ? at time  of bypass surgery   . BPH (benign prostatic hyperplasia)   . Cellulitis of arm, left    MSSA  . CHF (congestive heart failure) ([place])   . CKD (chronic kidney disease), stage III    "was told due to meds he takes"-no Renal workup done  . Colon polyps    adenomatous  . Coronary artery disease    remote CABG in 1985, cath in 2003 by Dr. Lia Foyer with no PCI, last nuclear in 2010 showing scar and EF of 28%.   . Diabetes mellitus   . Diabetic neuropathy ([place]) 04/25/2017  . Dyslipidemia   . Dyspnea   . Gait abnormality 04/25/2017  . Glaucoma 01-08-13   tx. eye drops  . HOH (hard of hearing)   . HTN (hypertension)   . Hypercholesterolemia   . Left ventricular dysfunction    28% per nuclear in 2010 and 35 to 40% per echo in 2010  . Myocardial infarction ([place])    1985  . Neuromuscular disorder ([place])    legs mild paralysis-able to walk"nerve damage"-legs- left leg brace   . Neuropathy   . Pneumonia    hx of several times years ago   . Spinal stenosis     Past Surgical History:  Procedure Laterality Date  . A/V FISTULAGRAM Right 10/17/2016   Procedure: A/V Fistulagram;  Surgeon: Conrad Parks, MD;  Location: [facility];  Service: Cardiovascular;  Laterality: Right;  . BACK SURGERY     hx of back surgery x 4   . BASCILIC VEIN TRANSPOSITION Right 09/09/2016   Procedure: BASCILIC VEIN TRANSPOSITION Right Arm;  Surgeon: Rosetta Posner, MD;  Location: [facility] OR;  Service: Vascular;  Laterality: Right;  . BI-VENTRICULAR IMPLANTABLE CARDIOVERTER DEFIBRILLATOR N/A 10/10/2014   Procedure: BI-VENTRICULAR IMPLANTABLE CARDIOVERTER DEFIBRILLATOR  (CRT-D);  Surgeon: [name], MD;  Location: [facility] CATH LAB;  Service: Cardiovascular;  Laterality: N/A;  . CARDIAC  CATHETERIZATION  2003  . Cataract      recent cataract surgery 6'14  . COLONOSCOPY N/A 01/25/2013   Procedure: COLONOSCOPY;  Surgeon: Irene Shipper, MD;  Location: [facility];  Service: Endoscopy;  Laterality: N/A;  . CORONARY ARTERY BYPASS GRAFT  1985   x 5 vessels  . LUMBAR DISC SURGERY    . PERIPHERAL VASCULAR BALLOON ANGIOPLASTY Right 10/17/2016   Procedure: Peripheral Vascular Balloon Angioplasty;  Surgeon: Conrad [place], MD;  Location: [facility];  Service: Cardiovascular;  Laterality: Right;  ARM VEINOUS AND CENTRAL VEIN  . PR POLYSOM 6/>YRS SLEEP 4/> ADDL PARAM ATTND  12/07/2015  . PROSTATE SURGERY    . TOTAL KNEE ARTHROPLASTY  08/01/2011   Procedure: TOTAL KNEE ARTHROPLASTY;  Surgeon: Johnn Hai;  Location: [facility] ORS;  Service: Orthopedics;  Laterality: Right;    Family Hx:  Family History  Problem Relation Age of Onset  . Heart attack Father   . Heart disease Father   . Colon polyps Father   . Lung cancer Mother   . Diabetes Sister        x 2  . Heart disease Brother        x 2  . Bone cancer Brother   . Lung disease Neg Hx     Social History:  reports that he quit smoking about 58 years ago. His smoking use included Cigarettes, Pipe, and Cigars. He has a 5.00 pack-year smoking history. He has never used smokeless tobacco. He reports that he does not drink alcohol or  use drugs.  Allergies:  Allergies  Allergen Reactions  . Codeine Other (See Comments)    anxiety  . Hydrocodone Other (See Comments)    anxiety  . Pseudoephedrine Other (See Comments)    Causes heart to race  . Azithromycin Rash    Medications: Prior to Admission medications   Medication Sig Start Date End Date Taking? Authorizing Provider  acetaminophen (TYLENOL) 500 MG tablet Take 1,000 mg by mouth every 8 (eight) hours as needed (pain).    Yes [provider]  albuterol (PROVENTIL HFA;VENTOLIN HFA) 108 (90 Base) MCG/ACT inhaler Inhale 2 puffs into the lungs every 4 (four) hours  as needed for wheezing or shortness of breath (cough, shortness of breath or wheezing.). Patient taking differently: Inhale 2 puffs into the lungs every 4 (four) hours as needed for wheezing or shortness of breath (cough).  02/10/17  Yes Elby Beck, FNP  amLODipine (NORVASC) 10 MG tablet Take 1 tablet (10 mg total) by mouth daily. 09/10/16  Yes Nahser, Wonda Cheng, MD  aspirin EC 81 MG tablet Take 81 mg by mouth daily.    Yes [provider]  atorvastatin (LIPITOR) 10 MG tablet Take 1 tablet (10 mg total) by mouth daily. 05/02/16  Yes Bensimhon, Shaune Pascal, MD  carvedilol (COREG) 25 MG tablet Take 1 tablet (25 mg total) by mouth 2 (two) times daily. 01/03/17  Yes [name], MD  cetirizine (ZYRTEC) 10 MG tablet Take 10 mg by mouth at bedtime as needed (seasonal allergies).    Yes [provider]  diphenhydrAMINE (BENADRYL) 25 mg capsule Take 25 mg by mouth 2 (two) times daily.    Yes [provider]  fluocinonide cream (LIDEX) 3.14 % Apply 1 application topically 2 (two) times daily as needed (for itchy rash).  03/16/15  Yes [provider]  gabapentin (NEURONTIN) 300 MG capsule Take 300 mg by mouth at bedtime.   Yes [provider]  hydrALAZINE (APRESOLINE) 50 MG tablet Take 1 tablet (50 mg total) by mouth 3 (three) times daily. 12/04/16  Yes Shirley Friar, PA-C  metolazone (ZAROXOLYN) 5 MG tablet Take 1 pill daily if weight exceeds 214 lb Patient taking differently: Take 5 mg by mouth See admin instructions. Take 1 tablet (5 mg) by mouth daily  if weight exceeds 214 lb 04/28/17  Yes Nahser, Wonda Cheng, MD  omalizumab Arvid Right) 150 MG injection Inject 300 mg into the skin every 14 (fourteen) days. Last injection given 04/25/17   Yes [provider]  potassium chloride (MICRO-K) 10 MEQ CR capsule Take 10 mEq by mouth daily as needed (with each metolazone dose).    Yes [provider]  tadalafil (CIALIS) 20 MG tablet Take 20 mg by mouth  daily as needed for erectile dysfunction. Do not exceed 2 doses in 7 days   Yes [provider]  testosterone cypionate (DEPOTESTOTERONE CYPIONATE) 100 MG/ML injection Inject 400 mg into the muscle every 21 ( twenty-one) days. For IM use only   Yes [provider]  torsemide (DEMADEX) 20 MG tablet Take 4 tablets (80 mg total) by mouth daily. 05/01/17  Yes Clegg, Amy D, NP  triamcinolone (NASACORT AQ) 55 MCG/ACT AERO nasal inhaler Place 2 sprays into the nose daily as needed (seasonal allergies).    Yes [provider]  zolpidem (AMBIEN) 10 MG tablet TAKE 1/2 TO 1 (ONE-HALF TO ONE) TABLET BY MOUTH AT BEDTIME AS NEEDED FOR SLEEP Patient taking differently: TAKE 1/2 TABLET (5 MG) BY  MOUTH DAILY AT BEDTIME 04/04/17  Yes Burns, Claudina Lick, MD    I have reviewed the patient's current medications.  Labs:  Results for orders placed or performed during the hospital encounter of 05/01/17 (from the past 48 hour(s))  Basic metabolic panel     Status: Abnormal   Collection Time: 05/02/17  4:27 AM  Result Value Ref Range   Sodium 134 (L) 135 - 145 mmol/L   Potassium 3.8 3.5 - 5.1 mmol/L   Chloride 92 (L) 101 - 111 mmol/L   CO2 30 22 - 32 mmol/L   Glucose, Bld 158 (H) 65 - 99 mg/dL   BUN 108 (H) 6 - 20 mg/dL   Creatinine, Ser 3.32 (H) 0.61 - 1.24 mg/dL   Calcium 8.6 (L) 8.9 - 10.3 mg/dL   GFR calc non Af Amer 16 (L) >60 mL/min   GFR calc Af Amer 19 (L) >60 mL/min    Comment: (NOTE) The eGFR has been calculated using the CKD EPI equation. This calculation has not been validated in all clinical situations. eGFR's persistently <60 mL/min signify possible Chronic Kidney Disease.    Anion gap 12 5 - 15  Magnesium     Status: Abnormal   Collection Time: 05/02/17  4:27 AM  Result Value Ref Range   Magnesium 2.5 (H) 1.7 - 2.4 mg/dL  Glucose, capillary     Status: Abnormal   Collection Time: 05/02/17 11:51 AM  Result Value Ref Range   Glucose-Capillary 120 (H) 65 - 99 mg/dL    Comment 1 Notify RN      ROS:  A comprehensive review of systems was negative except for: Constitutional: positive for fatigue Respiratory: positive for dyspnea on exertion Lower extremity edema  Physical Exam: Vitals:   05/02/17 0440 05/02/17 0808  BP: 134/60 118/62  Pulse: 75 77  Resp: 18   Temp: 97.9 F (36.6 C)   SpO2: 95%      General: Well-appearing white male. He is dyspneic with bed mobility but otherwise no acute distress  HEENT: pupils are equal round reactive to light, extra motions are intact, mucous members are moist Neck: Positive for JVD  Heart: Regular rate and rhythm  Lungs: Decreased breath sounds at the bases  Abdomen: Positive bowel sounds, soft, nontender, nondistended  Extremities: Minimal pitting edema - has beautiful right upper arm AV fistula which should be ready for use  Skin: Warm and dry  Neuro: Alert and nonfocal  Assessment/Plan: 83 year old white male with cardiomyopathy as well as CKD.  Of late pattern of volume overload, when attempting to treat volume overload having resulting uremia.  1.Renal- given advanced CK D and inability to remove fluid without causing uremia it seems like we are at a juncture where he will require initiation of dialysis. He is asking many appropriate questions and is agreeable to proceed. We'll plan for first dialysis treatment tomorrow 9/29  2. Hypertension/volume  - blood pressure now is good. He is on a regimen of hydralazine 25 3 times a day, Coreg 6.25 twice a day, amlodipine 10 mg daily.  I will stop the amlodipine in anticipation for blood pressure decrease with better volume control. We'll stop Lasix tomorrow  3. Anemia  - is not an issue at this time 4. Bones- we'll check phos  and intact PTH and treat as needed  Mays Paino A 05/02/2017, 1:20 PM

## 2017-05-02 NOTE — Evaluation (Addendum)
Physical Therapy Evaluation Patient Details Name: Shane [name] MRN: [id number] DOB: 1934-03-05 Today's Date: 05/02/2017   History of Present Illness  Pt is an 83 yo male with past medical history significant for hypertension, diabetes mellitus, coronary artery disease status post CABG also with an ICD and systolic heart failure with ejection fraction of 40-45% as well as advanced CKD followed by Dr. Madelon Lips at  [facility] [place] with progressive CKD. He is status post AV fistula placement  In February 2018 in preparation for dialysis. He has been to the education classes, he actually wanted to nocturnal hemo-versus home hemo but understands that he will need to be in Center first. As above, he's had progressive CK D. Creatinine in the twos now up to the threes. He was also found to be volume overloaded, was challenged with metolazone and unfortunately he became uremic. Pt to start hemodialysis 05/03/17.   Clinical Impression  Pt admitted with above diagnosis. Pt currently with functional limitations due to the deficits listed below (see PT Problem List). Pt's mobility is limited by decreased sensation and balance. Prior to admission pt was ambulating without an assistive device.  Pt is currently supervision for transfers and minA for steadying due to 1xLoB with ambulation of 150 feet with RW. Pt scored 29 on Berg Balance Test putting him at higher risk of falls. Due to increased risk pt encouraged to use RW for all ambulation, even short distances.  Pt will benefit from skilled PT to increase their independence and safety with mobility to allow discharge to the venue listed below.       Follow Up Recommendations Home health PT;Supervision/Assistance - 24 hour    Equipment Recommendations  None recommended by PT    Recommendations for Other Services       Precautions / Restrictions Precautions Precautions: None Restrictions Weight Bearing Restrictions: No      Mobility  Bed Mobility                General bed mobility comments: in recliner on entry  Transfers Overall transfer level: Needs assistance Equipment used: None Transfers: Sit to/from Stand Sit to Stand: Supervision         General transfer comment: supervision for safety, able to power up and static stand before reaching to RW  Ambulation/Gait Ambulation/Gait assistance: Min assist Ambulation Distance (Feet): 150 Feet Assistive device: Rolling walker (2 wheeled) Gait Pattern/deviations: Step-through pattern;Decreased step length - right;Decreased step length - left;Decreased dorsiflexion - right;Decreased dorsiflexion - left;Steppage Gait velocity: slowed Gait velocity interpretation: Below normal speed for age/gender General Gait Details: minA for 1xLoB with gait, utilizes steppage pattern with increased heel strike to compensate for foot drop and decreased sensation, vc for looking up and out        Balance                                 Standardized Balance Assessment Standardized Balance Assessment : Berg Balance Test Berg Balance Test Sit to Stand: Able to stand without using hands and stabilize independently Standing Unsupported: Able to stand 30 seconds unsupported Sitting with Back Unsupported but Feet Supported on Floor or Stool: Able to sit safely and securely 2 minutes Stand to Sit: Controls descent by using hands Transfers: Able to transfer with verbal cueing and /or supervision Standing Unsupported with Eyes Closed: Able to stand 3 seconds Standing Ubsupported with Feet Together: Able to place feet together independently but  unable to hold for 30 seconds From Standing, Reach Forward with Outstretched Arm: Reaches forward but needs supervision From Standing Position, Pick up Object from Floor: Able to pick up shoe, needs supervision From Standing Position, Turn to Look Behind Over each Shoulder: Turn sideways only but maintains balance Turn 360 Degrees: Able to turn 360  degrees safely but slowly Standing Unsupported, Alternately Place Feet on Step/Stool: Able to complete 4 steps without aid or supervision Standing Unsupported, One Foot in Front: Loses balance while stepping or standing Standing on One Leg: Unable to try or needs assist to prevent fall Total Score: 29         Pertinent Vitals/Pain Pain Assessment: 0-10 Pain Score: 3  Pain Location: L ankle Pain Descriptors / Indicators: Aching;Constant Pain Intervention(s): Monitored during session;Limited activity within patient's tolerance;Patient requesting pain meds-RN notified    Home Living Family/patient expects to be discharged to:: Private residence Living Arrangements: Spouse/significant other Available Help at Discharge: Family;Available 24 hours/day Type of Home: House Home Access: Stairs to enter Entrance Stairs-Rails: None Entrance Stairs-Number of Steps: 1 Home Layout: One level;Laundry or work area in [place]: Environmental consultant - 2 wheels;Cane - single point;Bedside commode;Grab bars - tub/shower;Hand held shower head;Shower seat - built in;Wheelchair - manual      Prior Function Level of Independence: Independent         Comments: community ambulator, uses grocery cart for support, Patent examiner        Extremity/Trunk Assessment   Upper Extremity Assessment Upper Extremity Assessment: Overall WFL for tasks assessed    Lower Extremity Assessment Lower Extremity Assessment: LLE deficits/detail;RLE deficits/detail RLE Deficits / Details: hip and knee ROM WFL, strength grossly 3+/5, ankle lacks 10 degrees of dorsiflexion, and strength grossly 2/5 RLE Sensation: decreased light touch;history of peripheral neuropathy (decreased sensation to mid dorsom and sole of foot) LLE Deficits / Details: L ankle collapse, necessitating boot to manage drop foot, L ankle dorsiflexion limited, L knee and hip ROM WFL, strength of hip and knee grossly 3+/5 LLE  Sensation: history of peripheral neuropathy;decreased light touch (decreased sensation to mid dorsom and sole of foot)       Communication   Communication: No difficulties  Cognition Arousal/Alertness: Awake/alert Behavior During Therapy: WFL for tasks assessed/performed Overall Cognitive Status: Within Functional Limits for tasks assessed                                        General Comments General comments (skin integrity, edema, etc.): 3/4 dyspnea on exertion, HR max with gait 101 bpm        Assessment/Plan    PT Assessment Patient needs continued PT services  PT Problem List Decreased strength;Decreased range of motion;Decreased activity tolerance;Decreased balance;Decreased mobility;Decreased safety awareness;Decreased knowledge of use of DME;Cardiopulmonary status limiting activity;Impaired sensation       PT Treatment Interventions DME instruction;Gait training;Stair training;Functional mobility training;Therapeutic activities;Therapeutic exercise;Balance training;Cognitive remediation;Patient/family education    PT Goals (Current goals can be found in the Care Plan section)  Acute Rehab PT Goals Patient Stated Goal: go to the beach soon PT Goal Formulation: With patient Time For Goal Achievement: 05/09/17 Potential to Achieve Goals: Good    Frequency Min 3X/week    AM-PAC PT "6 Clicks" Daily Activity  Outcome Measure Difficulty turning over in bed (including adjusting bedclothes, sheets and blankets)?: A Little Difficulty moving from  lying on back to sitting on the side of the bed? : A Lot Difficulty sitting down on and standing up from a chair with arms (e.g., wheelchair, bedside commode, etc,.)?: None Help needed moving to and from a bed to chair (including a wheelchair)?: None Help needed walking in hospital room?: A Little Help needed climbing 3-5 steps with a railing? : A Lot 6 Click Score: 18    End of Session Equipment Utilized During  Treatment: Gait belt Activity Tolerance: Patient tolerated treatment well Patient left: in chair;with call bell/phone within reach;with family/visitor present Nurse Communication: Mobility status PT Visit Diagnosis: Unsteadiness on feet (R26.81);Other abnormalities of gait and mobility (R26.89);History of falling (Z91.81);Muscle weakness (generalized) (M62.81);Difficulty in walking, not elsewhere classified (R26.2);Other symptoms and signs involving the nervous system (D22.025)    Time: 4270-6237 PT Time Calculation (min) (ACUTE ONLY): 20 min   Charges:   PT Evaluation $PT Eval Moderate Complexity: 1 Mod     PT G Codes:        Trayson Stitely B. Migdalia Dk PT, DPT Acute Rehabilitation  [phone] Pager [phone]    JAARS 05/02/2017, 5:35 PM

## 2017-05-02 NOTE — Progress Notes (Signed)
Advanced Heart Failure Rounding Note   Subjective:     Minimal response to lasix 120IV bid. Weight up a pound. Ab distension increasing. No dyspnea. + fatigue. + constipation   Objective:   Weight Range:  Vital Signs:   Temp:  [97.9 F (36.6 C)-98.4 F (36.9 C)] 97.9 F (36.6 C) (09/28 0440) Pulse Rate:  [74-81] 77 (09/28 0808) Resp:  [18-20] 18 (09/28 0440) BP: (118-146)/(60-86) 118/62 (09/28 0808) SpO2:  [92 %-98 %] 95 % (09/28 0440) Weight:  [99.1 kg (218 lb 8 oz)-101.3 kg (223 lb 6 oz)] 99.6 kg (219 lb 8 oz) (09/28 0440) Last BM Date: 05/01/17  Weight change: Filed Weights   05/01/17 1435 05/02/17 0440  Weight: 99.1 kg (218 lb 8 oz) 99.6 kg (219 lb 8 oz)    Intake/Output:   Intake/Output Summary (Last 24 hours) at 05/02/17 0918 Last data filed at 05/02/17 0905  Gross per 24 hour  Intake              220 ml  Output              575 ml  Net             -355 ml     Physical Exam: General:  Sitting in chair. No resp difficulty HEENT: normal Neck: supple. JVP to jaw. Carotids 2+ bilat; no bruits. No lymphadenopathy or thryomegaly appreciated. Cor: PMI nondisplaced. Regular rate & rhythm. No rubs, gallops or murmurs. Lungs: clear Abdomen: soft, nontender, + distended. No hepatosplenomegaly. No bruits or masses. Good bowel sounds. Extremities: no cyanosis, clubbing, rash, 1-2+ edema + AVF with thrill  Neuro: alert & orientedx3, cranial nerves grossly intact. moves all 4 extremities w/o difficulty. Affect pleasant No frank asterixis   Telemetry:  NSR 70s Personally reviewed   Labs: Basic Metabolic Panel:  Recent Labs Lab 04/28/17 1610 05/01/17 1108 05/02/17 0427  NA 138 131* 134*  K 3.3* 3.2* 3.8  CL 93* 89* 92*  CO2 28 28 30   GLUCOSE 124* 303* 158*  BUN 90* 103* 108*  CREATININE 2.96* 3.15* 3.32*  CALCIUM 9.1 8.6* 8.6*  MG  --   --  2.5*    Liver Function Tests:  Recent Labs Lab 04/25/17 1002  PROT 6.0   No results for input(s):  LIPASE, AMYLASE in the last 168 hours.  Recent Labs Lab 05/01/17 1108  AMMONIA 35    CBC:  Recent Labs Lab 05/01/17 1108  WBC 6.8  HGB 12.0*  HCT 38.8*  MCV 92.6  PLT 92*    Cardiac Enzymes: No results for input(s): CKTOTAL, CKMB, CKMBINDEX, TROPONINI in the last 168 hours.  BNP: BNP (last 3 results)  Recent Labs  05/01/17 1108  BNP 712.3*    ProBNP (last 3 results) No results for input(s): PROBNP in the last 8760 hours.    Other results:  Imaging: Dg Chest Port 1 View  Result Date: 05/02/2017 CLINICAL DATA:  83 year old male with a history of dyspnea EXAM: PORTABLE CHEST 1 VIEW COMPARISON:  02/19/2017 FINDINGS: Cardiomediastinal silhouette similar in size and contour to prior with cardiomegaly. Surgical changes of median sternotomy and CABG. Cardiac pacing device/AICD on the left chest wall, unchanged. Low lung volumes with ill-defined opacities at the bilateral lung bases. No pneumothorax. Diffuse interstitial opacities with no large confluent airspace disease. Nodular density in the right mid lung. IMPRESSION: Low lung volumes with mixed interstitial opacities, worst at the lung bases, may reflect scarring/atelectasis and prior episodes of CHF,  however, early edema not excluded. Surgical changes of median sternotomy and CABG, with unchanged cardiac AICD. Nodular density in the right mid lung, with a pulmonary nodule not excluded. Further evaluation with noncontrast chest CT is recommended. These results will be called to the ordering clinician or representative by the Radiologist Assistant, and communication documented in the PACS or zVision Dashboard. Electronically Signed   By: Corrie Mckusick D.O.   On: 05/02/2017 07:32      Medications:     Scheduled Medications: . amLODipine  10 mg Oral Daily  . aspirin EC  81 mg Oral Daily  . atorvastatin  10 mg Oral Daily  . carvedilol  12.5 mg Oral BID WC  . enoxaparin (LOVENOX) injection  30 mg Subcutaneous Q24H  .  gabapentin  300 mg Oral QHS  . hydrALAZINE  50 mg Oral TID  . loratadine  10 mg Oral Daily  . potassium chloride  40 mEq Oral BID  . sodium chloride flush  3 mL Intravenous Q12H     Infusions: . sodium chloride    . furosemide 120 mg (05/02/17 0803)     PRN Medications:  sodium chloride, acetaminophen, albuterol, ondansetron (ZOFRAN) IV, sodium chloride flush, zolpidem   Assessment:    Mr Henrichs is an 83 year old with CAD s/p CABG, systolic HF EF 07-12% and CKD 4 admitted with marked volume overload and worsening renal function. .    Plan/Discussion:     1. Acute/Chronic systolic congestive heart failure-due to ischemic cardiomyopathy. EF 40-45% echo 4/17. S/p ICD.Renal function worsening.  - Volume status worsening. No response to 120 IV lasix. Have consulted Renal for possible initiation of HD 2. Coronary artery disease - history of remote coronary artery bypass grafting 1985 -No s/s ischemia - Last cath 2003 LM and RCA occluded. LIMA to LAD ok. RIMA to OM ok SVG to PDA occluded. EF 45% 3. Acute on CKD 4- creatinine baseline 2.5-2.8 due to cardio-renal syndrome  - followed by Dr. Hollie Salk in Nephrology.  - Has AVF in place.  - No response to high-dose lasix. BUN now 108. Has worsening volume overload. No frank uremia. Likely needs initiation of HD. We have consulted Renal.  4. Diabetes mellitus -  Continue current regimen. Cover with SSI 5. HTN - BP controlled. Perhaps a bit on low side. Will cut back anti-HTN meds    Length of Stay: 1   Clarita Mcelvain MD 05/02/2017, 9:18 AM  Advanced Heart Failure Team Pager [phone] (M-F; [place])  Please contact [facility] for night-coverage after hours (4p -7a ) and weekends on [email or web address]

## 2017-05-03 DIAGNOSIS — I5033 Acute on chronic diastolic (congestive) heart failure: Secondary | ICD-10-CM

## 2017-05-03 DIAGNOSIS — N179 Acute kidney failure, unspecified: Secondary | ICD-10-CM

## 2017-05-03 LAB — GLUCOSE, CAPILLARY
GLUCOSE-CAPILLARY: 103 mg/dL — AB (ref 65–99)
Glucose-Capillary: 155 mg/dL — ABNORMAL HIGH (ref 65–99)
Glucose-Capillary: 152 mg/dL — ABNORMAL HIGH (ref 65–99)
Glucose-Capillary: 141 mg/dL — ABNORMAL HIGH (ref 65–99)

## 2017-05-03 LAB — BASIC METABOLIC PANEL
ANION GAP: 11 (ref 5–15)
BUN: 109 mg/dL — ABNORMAL HIGH (ref 6–20)
CHLORIDE: 95 mmol/L — AB (ref 101–111)
CO2: 30 mmol/L (ref 22–32)
Calcium: 8.7 mg/dL — ABNORMAL LOW (ref 8.9–10.3)
Creatinine, Ser: 3.17 mg/dL — ABNORMAL HIGH (ref 0.61–1.24)
GFR calc non Af Amer: 17 mL/min — ABNORMAL LOW (ref >60)
GFR, EST AFRICAN AMERICAN: 20 mL/min — AB (ref >60)
GLUCOSE: 141 mg/dL — AB (ref 65–99)
Potassium: 3.1 mmol/L — ABNORMAL LOW (ref 3.5–5.1)
Sodium: 136 mmol/L (ref 135–145)

## 2017-05-03 MED ORDER — AMLODIPINE BESYLATE 5 MG PO TABS
5.0000 mg | ORAL_TABLET | Freq: Every day | ORAL | Status: DC
  Administered 2017-05-03 – 2017-05-06 (×4): 5 mg via ORAL
  Filled 2017-05-03 (×4): qty 1

## 2017-05-03 MED ORDER — BISACODYL 5 MG PO TBEC: 5.0000 mg | DELAYED_RELEASE_TABLET | Freq: Every day | ORAL | Status: DC | PRN

## 2017-05-03 MED ORDER — BISACODYL 5 MG PO TBEC
5.0000 mg | DELAYED_RELEASE_TABLET | Freq: Every day | ORAL | Status: DC | PRN
  Administered 2017-05-03: 5 mg via ORAL
  Filled 2017-05-03: qty 1

## 2017-05-03 MED ORDER — BISACODYL 5 MG PO TBEC
5.0000 mg | DELAYED_RELEASE_TABLET | Freq: Every day | ORAL | Status: DC | PRN
  Administered 2017-05-04 – 2017-05-06 (×2): 5 mg via ORAL
  Filled 2017-05-03 (×2): qty 1

## 2017-05-03 MED ORDER — POTASSIUM CHLORIDE CRYS ER 20 MEQ PO TBCR
40.0000 meq | EXTENDED_RELEASE_TABLET | Freq: Once | ORAL | Status: AC
  Administered 2017-05-03: 40 meq via ORAL
  Filled 2017-05-03: qty 2

## 2017-05-03 NOTE — Progress Notes (Signed)
Subjective:  UOP better overnight- weight down - he feels better.  crt down slightly but BUN still over 100.  Dr. Haroldine Laws had contacted me to ask to hold off on dialysis initiation  Objective Vital signs in last 24 hours: Vitals:   05/02/17 0808 05/02/17 1927 05/03/17 0536 05/03/17 0900  BP: 118/62 (!) 145/65 (!) 141/62 (!) 141/60  Pulse: 77 81 84 78  Resp:  18 20 20   Temp:  98.1 F (36.7 C) 98.4 F (36.9 C) 97.8 F (36.6 C)  TempSrc:  Oral Oral Oral  SpO2:  93% 92% 95%  Weight:   97.7 kg (215 lb 6.4 oz)   Height:       Weight change: -1.406 kg (-3 lb 1.6 oz)  Intake/Output Summary (Last 24 hours) at 05/03/17 1052 Last data filed at 05/03/17 2595  Gross per 24 hour  Intake              723 ml  Output             2951 ml  Net            -2228 ml    Assessment/Plan: 83 year old white male with cardiomyopathy as well as CKD.  Of late pattern of volume overload, when attempting to treat volume overload having resulting uremia.  1.Renal- given advanced CKD and inability to remove fluid without causing uremia it seemed like we were at a juncture where he may require initiation of dialysis. He is asking many appropriate questions and is agreeable to proceed if needed. Now with events of improvement will hold off on initiation for now 2. Hypertension/volume  - blood pressure now is higher. He was on a regimen of hydralazine 25 3 times a day, Coreg 6.25 twice a day, amlodipine 10 mg daily.  I had stopped the amlodipine in anticipation for blood pressure decrease with better volume control. Will resume at 5 mg daily - cont lasix 120 BID 3. Anemia  - is not an issue at this time 4. Bones- we'll check phos  and intact PTH and treat as needed    Shane [name] A    Labs: Basic Metabolic Panel:  Recent Labs Lab 05/01/17 1108 05/02/17 0427 05/03/17 0451  NA 131* 134* 136  K 3.2* 3.8 3.1*  CL 89* 92* 95*  CO2 28 30 30   GLUCOSE 303* 158* 141*  BUN 103* 108* 109*  CREATININE  3.15* 3.32* 3.17*  CALCIUM 8.6* 8.6* 8.7*   Liver Function Tests: No results for input(s): AST, ALT, ALKPHOS, BILITOT, PROT, ALBUMIN in the last 168 hours. No results for input(s): LIPASE, AMYLASE in the last 168 hours.  Recent Labs Lab 05/01/17 1108  AMMONIA 35   CBC:  Recent Labs Lab 05/01/17 1108  WBC 6.8  HGB 12.0*  HCT 38.8*  MCV 92.6  PLT 92*   Cardiac Enzymes: No results for input(s): CKTOTAL, CKMB, CKMBINDEX, TROPONINI in the last 168 hours. CBG:  Recent Labs Lab 05/02/17 1151 05/02/17 1635 05/03/17 0751  GLUCAP 120* 145* 155*    Iron Studies: No results for input(s): IRON, TIBC, TRANSFERRIN, FERRITIN in the last 72 hours. Studies/Results: Dg Chest Port 1 View  Result Date: 05/02/2017 CLINICAL DATA:  83 year old male with a history of dyspnea EXAM: PORTABLE CHEST 1 VIEW COMPARISON:  02/19/2017 FINDINGS: Cardiomediastinal silhouette similar in size and contour to prior with cardiomegaly. Surgical changes of median sternotomy and CABG. Cardiac pacing device/AICD on the left chest wall, unchanged. Low lung volumes with ill-defined opacities at  the bilateral lung bases. No pneumothorax. Diffuse interstitial opacities with no large confluent airspace disease. Nodular density in the right mid lung. IMPRESSION: Low lung volumes with mixed interstitial opacities, worst at the lung bases, may reflect scarring/atelectasis and prior episodes of CHF, however, early edema not excluded. Surgical changes of median sternotomy and CABG, with unchanged cardiac AICD. Nodular density in the right mid lung, with a pulmonary nodule not excluded. Further evaluation with noncontrast chest CT is recommended. These results will be called to the ordering clinician or representative by the Radiologist Assistant, and communication documented in the PACS or zVision Dashboard. Electronically Signed   By: Corrie Mckusick D.O.   On: 05/02/2017 07:32   Medications: Infusions: . sodium chloride    .  furosemide 120 mg (05/03/17 0926)    Scheduled Medications: . aspirin EC  81 mg Oral Daily  . atorvastatin  10 mg Oral Daily  . carvedilol  6.25 mg Oral BID WC  . enoxaparin (LOVENOX) injection  30 mg Subcutaneous Q24H  . gabapentin  300 mg Oral QHS  . hydrALAZINE  25 mg Oral TID  . insulin aspart  0-9 Units Subcutaneous TID WC  . loratadine  10 mg Oral Daily  . potassium chloride  40 mEq Oral BID  . potassium chloride  40 mEq Oral Once  . sodium chloride flush  3 mL Intravenous Q12H    have reviewed scheduled and prn medications.  Physical Exam: General: NAD- up in chair Heart: RRR Lungs: mostly clear Abdomen: obese, non tender Extremities: still with pitting edema Dialysis Access: right upper arm AVF is patent     05/03/2017,10:52 AM  LOS: 2 days

## 2017-05-03 NOTE — Progress Notes (Signed)
Advanced Heart Failure Rounding Note   Subjective:    Urine output improved with IV lasix overnight. Weight down 4 pounds. Creatinine slightly improved. BUN 108-> 109.   Renal has seen planning possible initiation of HD today.  Objective:   Weight Range:  Vital Signs:   Temp:  [98.1 F (36.7 C)-98.4 F (36.9 C)] 98.4 F (36.9 C) (09/29 0536) Pulse Rate:  [81-84] 84 (09/29 0536) Resp:  [18-20] 20 (09/29 0536) BP: (141-145)/(62-65) 141/62 (09/29 0536) SpO2:  [92 %-93 %] 92 % (09/29 0536) Weight:  [97.7 kg (215 lb 6.4 oz)] 97.7 kg (215 lb 6.4 oz) (09/29 0536) Last BM Date: 05/02/17  Weight change: Filed Weights   05/01/17 1435 05/02/17 0440 05/03/17 0536  Weight: 99.1 kg (218 lb 8 oz) 99.6 kg (219 lb 8 oz) 97.7 kg (215 lb 6.4 oz)    Intake/Output:   Intake/Output Summary (Last 24 hours) at 05/03/17 0919 Last data filed at 05/03/17 0620  Gross per 24 hour  Intake              480 ml  Output             2150 ml  Net            -1670 ml     Physical Exam: General:  Sitting in chair. No resp difficulty HEENT: normal Neck: supple. JVP jaw. Carotids 2+ bilat; no bruits. No lymphadenopathy or thryomegaly appreciated. Cor: PMI nondisplaced. Regular rate & rhythm. No rubs, gallops or murmurs. Lungs: clear Abdomen: soft, nontender, mildly distended. No hepatosplenomegaly. No bruits or masses. Good bowel sounds. Extremities: no cyanosis, clubbing, rash, 1-2+ edema  RUE AVF in [facility] fossa Neuro: alert & orientedx3, cranial nerves grossly intact. moves all 4 extremities w/o difficulty. Affect pleasant   Telemetry:  NSR 70-80s Personally reviewed    Labs: Basic Metabolic Panel:  Recent Labs Lab 04/28/17 1610 05/01/17 1108 05/02/17 0427 05/03/17 0451  NA 138 131* 134* 136  K 3.3* 3.2* 3.8 3.1*  CL 93* 89* 92* 95*  CO2 28 28 30 30   GLUCOSE 124* 303* 158* 141*  BUN 90* 103* 108* 109*  CREATININE 2.96* 3.15* 3.32* 3.17*  CALCIUM 9.1 8.6* 8.6* 8.7*  MG  --   --   2.5*  --     Liver Function Tests: No results for input(s): AST, ALT, ALKPHOS, BILITOT, PROT, ALBUMIN in the last 168 hours. No results for input(s): LIPASE, AMYLASE in the last 168 hours.  Recent Labs Lab 05/01/17 1108  AMMONIA 35    CBC:  Recent Labs Lab 05/01/17 1108  WBC 6.8  HGB 12.0*  HCT 38.8*  MCV 92.6  PLT 92*    Cardiac Enzymes: No results for input(s): CKTOTAL, CKMB, CKMBINDEX, TROPONINI in the last 168 hours.  BNP: BNP (last 3 results)  Recent Labs  05/01/17 1108  BNP 712.3*    ProBNP (last 3 results) No results for input(s): PROBNP in the last 8760 hours.    Other results:  Imaging: Dg Chest Port 1 View  Result Date: 05/02/2017 CLINICAL DATA:  83 year old male with a history of dyspnea EXAM: PORTABLE CHEST 1 VIEW COMPARISON:  02/19/2017 FINDINGS: Cardiomediastinal silhouette similar in size and contour to prior with cardiomegaly. Surgical changes of median sternotomy and CABG. Cardiac pacing device/AICD on the left chest wall, unchanged. Low lung volumes with ill-defined opacities at the bilateral lung bases. No pneumothorax. Diffuse interstitial opacities with no large confluent airspace disease. Nodular density in the right  mid lung. IMPRESSION: Low lung volumes with mixed interstitial opacities, worst at the lung bases, may reflect scarring/atelectasis and prior episodes of CHF, however, early edema not excluded. Surgical changes of median sternotomy and CABG, with unchanged cardiac AICD. Nodular density in the right mid lung, with a pulmonary nodule not excluded. Further evaluation with noncontrast chest CT is recommended. These results will be called to the ordering clinician or representative by the Radiologist Assistant, and communication documented in the PACS or zVision Dashboard. Electronically Signed   By: Corrie Mckusick D.O.   On: 05/02/2017 07:32     Medications:     Scheduled Medications: . aspirin EC  81 mg Oral Daily  . atorvastatin   10 mg Oral Daily  . carvedilol  6.25 mg Oral BID WC  . enoxaparin (LOVENOX) injection  30 mg Subcutaneous Q24H  . gabapentin  300 mg Oral QHS  . hydrALAZINE  25 mg Oral TID  . insulin aspart  0-9 Units Subcutaneous TID WC  . loratadine  10 mg Oral Daily  . potassium chloride  40 mEq Oral BID  . sodium chloride flush  3 mL Intravenous Q12H    Infusions: . sodium chloride    . furosemide 120 mg (05/02/17 1748)    PRN Medications: sodium chloride, acetaminophen, albuterol, ondansetron (ZOFRAN) IV, sodium chloride flush, zolpidem   Assessment:    Mr Kimmey is an 83 year old with CAD s/p CABG, systolic HF EF 33-35% and CKD 4 admitted with marked volume overload and worsening renal function. .    Plan/Discussion:     1. Acute/Chronic systolic congestive heart failure-due to ischemic cardiomyopathy. EF 40-45% echo 4/17. S/p ICD.Renal function worsening.  - Now responding to IV lasix. Still volume overloaded. Will continue.  - Have d/w Renal. Will hold HD today and see what happens.  - Place TED hose 2. Coronary artery disease - history of remote coronary artery bypass grafting 1985 -No s/s ischemia - Last cath 2003 LM and RCA occluded. LIMA to LAD ok. RIMA to OM ok SVG to PDA occluded. EF 45% 3. Acute on CKD 4- creatinine baseline 2.5-2.8 due to cardio-renal syndrome  - followed by Dr. Hollie Salk in Nephrology.  - Has AVF in place.  - Now responding to IV lasix. Creatinine slightly improved. BUN still high. No uremia. D/w Dr. Moshe Cipro. Will hold off on initiation of HD today. Will likely need soon 4. Diabetes mellitus -  Continue current regimen. Cover with SSI 5. HTN - BP slightly elevated. We cut back on anti-HTN meds yesterday to help renal perfusion    Length of Stay: 2   Bensimhon, Daniel MD 05/03/2017, 9:19 AM  Advanced Heart Failure Team Pager [phone] (M-F; 7a - 4p)  Please contact [facility] for night-coverage after hours (4p -7a ) and weekends on  [email or web address]

## 2017-05-04 LAB — BASIC METABOLIC PANEL
Anion gap: 11 (ref 5–15)
BUN: 104 mg/dL — AB (ref 6–20)
CHLORIDE: 95 mmol/L — AB (ref 101–111)
CO2: 32 mmol/L (ref 22–32)
CREATININE: 3.05 mg/dL — AB (ref 0.61–1.24)
Calcium: 9 mg/dL (ref 8.9–10.3)
GFR calc Af Amer: 21 mL/min — ABNORMAL LOW (ref >60)
GFR calc non Af Amer: 18 mL/min — ABNORMAL LOW (ref >60)
GLUCOSE: 143 mg/dL — AB (ref 65–99)
POTASSIUM: 3.4 mmol/L — AB (ref 3.5–5.1)
SODIUM: 138 mmol/L (ref 135–145)

## 2017-05-04 LAB — GLUCOSE, CAPILLARY
GLUCOSE-CAPILLARY: 112 mg/dL — AB (ref 65–99)
GLUCOSE-CAPILLARY: 252 mg/dL — AB (ref 65–99)
GLUCOSE-CAPILLARY: 257 mg/dL — AB (ref 65–99)
GLUCOSE-CAPILLARY: 79 mg/dL (ref 65–99)
GLUCOSE-CAPILLARY: 132 mg/dL — AB (ref 65–99)

## 2017-05-04 LAB — RENAL FUNCTION PANEL
ANION GAP: 11 (ref 5–15)
Albumin: 3.5 g/dL (ref 3.5–5.0)
BUN: 104 mg/dL — ABNORMAL HIGH (ref 6–20)
CALCIUM: 8.9 mg/dL (ref 8.9–10.3)
CO2: 32 mmol/L (ref 22–32)
Chloride: 94 mmol/L — ABNORMAL LOW (ref 101–111)
Creatinine, Ser: 3 mg/dL — ABNORMAL HIGH (ref 0.61–1.24)
GFR calc Af Amer: 21 mL/min — ABNORMAL LOW (ref >60)
GFR calc non Af Amer: 18 mL/min — ABNORMAL LOW (ref >60)
GLUCOSE: 140 mg/dL — AB (ref 65–99)
PHOSPHORUS: 3.3 mg/dL (ref 2.5–4.6)
POTASSIUM: 3.3 mmol/L — AB (ref 3.5–5.1)
SODIUM: 137 mmol/L (ref 135–145)

## 2017-05-04 LAB — PARATHYROID HORMONE, INTACT (NO CA): PTH: 77 pg/mL — ABNORMAL HIGH (ref 15–65)

## 2017-05-04 MED ORDER — POTASSIUM CHLORIDE CRYS ER 20 MEQ PO TBCR
40.0000 meq | EXTENDED_RELEASE_TABLET | Freq: Once | ORAL | Status: AC
  Administered 2017-05-04: 40 meq via ORAL
  Filled 2017-05-04: qty 2

## 2017-05-04 NOTE — Progress Notes (Signed)
Patient ID: Shane [name], male   DOB: 21-Jul-1934, 83 y.o.   MRN: [id number]    Advanced Heart Failure Rounding Note   Subjective:    He is doing better, less dyspneic when walking around room.  BUN/creatinine appear to have plateaued.  Good diuresis yesterday with weight down 5 lbs.    Objective:   Weight Range:  Vital Signs:   Temp:  [98 F (36.7 C)-98.5 F (36.9 C)] 98.5 F (36.9 C) (09/30 0544) Pulse Rate:  [77-80] 78 (09/30 0544) Resp:  [16-20] 16 (09/29 2148) BP: (133-138)/(58-79) 135/67 (09/30 0544) SpO2:  [91 %-94 %] 91 % (09/30 0544) Weight:  [210 lb 9.6 oz (95.5 kg)] 210 lb 9.6 oz (95.5 kg) (09/30 0544) Last BM Date: 05/03/17  Weight change: Filed Weights   05/02/17 0440 05/03/17 0536 05/04/17 0544  Weight: 219 lb 8 oz (99.6 kg) 215 lb 6.4 oz (97.7 kg) 210 lb 9.6 oz (95.5 kg)    Intake/Output:   Intake/Output Summary (Last 24 hours) at 05/04/17 1009 Last data filed at 05/04/17 0854  Gross per 24 hour  Intake              785 ml  Output             2850 ml  Net            -2065 ml     Physical Exam: General: NAD Neck: JVP 16 cm, no thyromegaly or thyroid nodule.  Lungs: Clear to auscultation bilaterally with normal respiratory effort. CV: Nondisplaced PMI.  Heart regular S1/S2, no S3/S4, 1/6 SEM RUSB.  1+ ankle edema.   Abdomen: Soft, nontender, no hepatosplenomegaly, no distention.  Skin: Intact without lesions or rashes.  Neurologic: Alert and oriented x 3.  Psych: Normal affect. Extremities: No clubbing or cyanosis.  HEENT: Normal.   Telemetry:  NSR 70s, personally reviewed  Labs: Basic Metabolic Panel:  Recent Labs Lab 04/28/17 1610 05/01/17 1108 05/02/17 0427 05/03/17 0451 05/04/17 0220  NA 138 131* 134* 136 137  138  K 3.3* 3.2* 3.8 3.1* 3.3*  3.4*  CL 93* 89* 92* 95* 94*  95*  CO2 28 28 30 30  32  32  GLUCOSE 124* 303* 158* 141* 140*  143*  BUN 90* 103* 108* 109* 104*  104*  CREATININE 2.96* 3.15* 3.32* 3.17* 3.00*  3.05*    CALCIUM 9.1 8.6* 8.6* 8.7* 8.9  9.0  MG  --   --  2.5*  --   --   PHOS  --   --   --   --  3.3    Liver Function Tests:  Recent Labs Lab 05/04/17 0220  ALBUMIN 3.5   No results for input(s): LIPASE, AMYLASE in the last 168 hours.  Recent Labs Lab 05/01/17 1108  AMMONIA 35    CBC:  Recent Labs Lab 05/01/17 1108  WBC 6.8  HGB 12.0*  HCT 38.8*  MCV 92.6  PLT 92*    Cardiac Enzymes: No results for input(s): CKTOTAL, CKMB, CKMBINDEX, TROPONINI in the last 168 hours.  BNP: BNP (last 3 results)  Recent Labs  05/01/17 1108  BNP 712.3*    ProBNP (last 3 results) No results for input(s): PROBNP in the last 8760 hours.    Other results:  Imaging: No results found.   Medications:     Scheduled Medications: . amLODipine  5 mg Oral Daily  . aspirin EC  81 mg Oral Daily  . atorvastatin  10 mg Oral Daily  .  carvedilol  6.25 mg Oral BID WC  . enoxaparin (LOVENOX) injection  30 mg Subcutaneous Q24H  . gabapentin  300 mg Oral QHS  . hydrALAZINE  25 mg Oral TID  . insulin aspart  0-9 Units Subcutaneous TID WC  . loratadine  10 mg Oral Daily  . potassium chloride  40 mEq Oral BID  . potassium chloride  40 mEq Oral Once  . sodium chloride flush  3 mL Intravenous Q12H    Infusions: . sodium chloride    . furosemide 120 mg (05/04/17 0855)    PRN Medications: sodium chloride, acetaminophen, albuterol, bisacodyl, ondansetron (ZOFRAN) IV, sodium chloride flush, zolpidem   Assessment:    Shane [name] is an 83 year old with CAD s/p CABG, systolic HF EF 38-46% and CKD 4 admitted with marked volume overload and worsening renal function. .    Plan/Discussion:     1. Acute/chronic systolic congestive heart failure: Due to ischemic cardiomyopathy. EF 40-45% echo 4/17. S/p ICD.  Renal function worsened this admisison.  He remains volume overloaded on exam but is starting to diurese and renal function appears to have plateaued.  - Continue Lasix 120 mg IV  bid.  Does not need initiation of HD yet.   2. Coronary artery disease: History of remote coronary artery bypass grafting 1985. No chest pain. Last cath 2003 LM and RCA occluded. LIMA to LAD ok. RIMA to OM ok SVG to PDA occluded.  3. Acute on CKD 4: Creatinine baseline 2.5-2.8 due to cardio-renal syndrome. Followed by Dr. Hollie Salk in Nephrology. Has AVF in place. Now responding to IV lasix. No uremia. BUN/creatinine appear to have plateaued.   - At this point, does not need HD initiation.  Will follow closely as he needs more fluid off but responding now to Lasix.  4. Diabetes mellitus - Continue current regimen. Cover with SSI 5. HTN: BP controlled on current meds.   Length of Stay: 3  Loralie Champagne MD 05/04/2017, 10:09 AM  Advanced Heart Failure Team Pager [phone] (M-F; 7a - 4p)  Please contact [facility] for night-coverage after hours (4p -7a ) and weekends on [email or web address]

## 2017-05-04 NOTE — Progress Notes (Signed)
Subjective:  UOP better (3650) overnight- weight down again- really to the weight he shoots for  - he feels better.  crt down slightly but BUN still over 100.    Objective Vital signs in last 24 hours: Vitals:   05/03/17 0900 05/03/17 1300 05/03/17 2148 05/04/17 0544  BP: (!) 141/60 (!) 138/58 133/79 135/67  Pulse: 78 77 80 78  Resp: 20 20 16    Temp: 97.8 F (36.6 C) 98.1 F (36.7 C) 98 F (36.7 C) 98.5 F (36.9 C)  TempSrc: Oral Oral Oral Oral  SpO2: 95% 93% 94% 91%  Weight:    95.5 kg (210 lb 9.6 oz)  Height:       Weight change: -2.177 kg (-4 lb 12.8 oz)  Intake/Output Summary (Last 24 hours) at 05/04/17 1240 Last data filed at 05/04/17 1054  Gross per 24 hour  Intake              785 ml  Output             3300 ml  Net            -2515 ml    Assessment/Plan: 83 year old white male with cardiomyopathy as well as CKD.  Of late pattern of volume overload, when attempting to treat volume overload having resulting uremia.  1.Renal- given advanced CKD and inability to remove fluid without causing uremia it seemed like we were at a juncture where he may require initiation of dialysis. He is asking many appropriate questions and was agreeable to proceed if needed. Now with events of improvement will hold off on initiation for now.  He may escape this hosp without HD need to follow up with Dr. Hollie Salk  2. Hypertension/volume  - blood pressure now is higher. He was on a regimen of hydralazine 25 3 times a day, Coreg 6.25 twice a day, amlodipine 10 mg daily.  I had stopped the amlodipine in anticipation for blood pressure decrease with better volume control. resumed at 5 mg daily - cont lasix 120 IV BID per cards 3. Anemia  - is not an issue at this time 4. Bones- we'll check phos- 3.3  and intact PTH (pending ) and treat as needed    Letticia Bhattacharyya A    Labs: Basic Metabolic Panel:  Recent Labs Lab 05/02/17 0427 05/03/17 0451 05/04/17 0220  NA 134* 136 137  138  K 3.8  3.1* 3.3*  3.4*  CL 92* 95* 94*  95*  CO2 30 30 32  32  GLUCOSE 158* 141* 140*  143*  BUN 108* 109* 104*  104*  CREATININE 3.32* 3.17* 3.00*  3.05*  CALCIUM 8.6* 8.7* 8.9  9.0  PHOS  --   --  3.3   Liver Function Tests:  Recent Labs Lab 05/04/17 0220  ALBUMIN 3.5   No results for input(s): LIPASE, AMYLASE in the last 168 hours.  Recent Labs Lab 05/01/17 1108  AMMONIA 35   CBC:  Recent Labs Lab 05/01/17 1108  WBC 6.8  HGB 12.0*  HCT 38.8*  MCV 92.6  PLT 92*   Cardiac Enzymes: No results for input(s): CKTOTAL, CKMB, CKMBINDEX, TROPONINI in the last 168 hours. CBG:  Recent Labs Lab 05/03/17 1600 05/03/17 2146 05/04/17 0747 05/04/17 1127 05/04/17 1130  GLUCAP 152* 141* 112* 252* 257*    Iron Studies: No results for input(s): IRON, TIBC, TRANSFERRIN, FERRITIN in the last 72 hours. Studies/Results: No results found. Medications: Infusions: . sodium chloride    . furosemide Stopped (  05/04/17 0955)    Scheduled Medications: . amLODipine  5 mg Oral Daily  . aspirin EC  81 mg Oral Daily  . atorvastatin  10 mg Oral Daily  . carvedilol  6.25 mg Oral BID WC  . enoxaparin (LOVENOX) injection  30 mg Subcutaneous Q24H  . gabapentin  300 mg Oral QHS  . hydrALAZINE  25 mg Oral TID  . insulin aspart  0-9 Units Subcutaneous TID WC  . loratadine  10 mg Oral Daily  . potassium chloride  40 mEq Oral BID  . potassium chloride  40 mEq Oral Once  . sodium chloride flush  3 mL Intravenous Q12H    have reviewed scheduled and prn medications.  Physical Exam: General: NAD- up in chair Heart: RRR Lungs: mostly clear Abdomen: obese, non tender Extremities: still with pitting edema Dialysis Access: right upper arm AVF is patent     05/04/2017,12:40 PM  LOS: 3 days

## 2017-05-05 ENCOUNTER — Inpatient Hospital Stay (HOSPITAL_COMMUNITY): Admission: RE | Admit: 2017-05-05 | Payer: Medicare Other | Source: Ambulatory Visit

## 2017-05-05 LAB — RENAL FUNCTION PANEL
ANION GAP: 14 (ref 5–15)
Albumin: 3.5 g/dL (ref 3.5–5.0)
BUN: 99 mg/dL — ABNORMAL HIGH (ref 6–20)
CHLORIDE: 93 mmol/L — AB (ref 101–111)
CO2: 30 mmol/L (ref 22–32)
Calcium: 9.2 mg/dL (ref 8.9–10.3)
Creatinine, Ser: 2.82 mg/dL — ABNORMAL HIGH (ref 0.61–1.24)
GFR calc Af Amer: 23 mL/min — ABNORMAL LOW (ref >60)
GFR calc non Af Amer: 20 mL/min — ABNORMAL LOW (ref >60)
GLUCOSE: 131 mg/dL — AB (ref 65–99)
POTASSIUM: 3.5 mmol/L (ref 3.5–5.1)
Phosphorus: 3 mg/dL (ref 2.5–4.6)
Sodium: 137 mmol/L (ref 135–145)

## 2017-05-05 LAB — GLUCOSE, CAPILLARY
GLUCOSE-CAPILLARY: 169 mg/dL — AB (ref 65–99)
GLUCOSE-CAPILLARY: 218 mg/dL — AB (ref 65–99)
Glucose-Capillary: 141 mg/dL — ABNORMAL HIGH (ref 65–99)
Glucose-Capillary: 103 mg/dL — ABNORMAL HIGH (ref 65–99)

## 2017-05-05 NOTE — Care Management Important Message (Signed)
Important Message  Patient Details  Name: Shane [name] MRN: [id number] Date of Birth: 09-Sep-1933   Medicare Important Message Given:  Yes    Maybell Misenheimer 05/05/2017, 1:10 PM

## 2017-05-05 NOTE — Progress Notes (Signed)
Physical Therapy Treatment Patient Details Name: Shane [name] MRN: [id number] DOB: 04-30-1934 Today's Date: 05/05/2017    History of Present Illness Pt is an 83 yo male with past medical history significant for hypertension, diabetes mellitus, coronary artery disease status post CABG also with an ICD and systolic heart failure with ejection fraction of 40-45% as well as advanced CKD followed by Dr. Madelon Lips at  [facility] with progressive CKD. He is status post AV fistula placement  In February 2018 in preparation for dialysis. He has been to the education classes, he actually wanted to nocturnal hemo-versus home hemo but understands that he will need to be in Center first. As above, he's had progressive CK D. Creatinine in the twos now up to the threes. He was also found to be volume overloaded, was challenged with metolazone and unfortunately he became uremic. Pt to start hemodialysis 05/03/17.     PT Comments    Pt is much more steady on his feet today and is able to ambulate 200 without AD and without LOB. Pt educated on need to keep RW available when he is home if he should have increased fluid volume and experiences balance deficits. Pt worked on balance activities in corner of the room so that he can use walls for support if needed. Pt would continue to benefit from HHPT to address balance training for safety. PT will continue to follow acutely to work on balance and endurance training for safe navigation of his home environment at discharge.     Follow Up Recommendations  Home health PT;Supervision/Assistance - 24 hour     Equipment Recommendations  None recommended by PT    Recommendations for Other Services       Precautions / Restrictions Precautions Precautions: None Restrictions Weight Bearing Restrictions: No    Mobility  Bed Mobility Overal bed mobility: Modified Independent                Transfers Overall transfer level: Needs assistance Equipment used:  None Transfers: Sit to/from Stand Sit to Stand: Modified independent (Device/Increase time)         General transfer comment: increased time to upright  Ambulation/Gait Ambulation/Gait assistance: Supervision Ambulation Distance (Feet): 200 Feet Assistive device: None Gait Pattern/deviations: Step-through pattern;Decreased step length - right;Decreased step length - left;Decreased dorsiflexion - right;Decreased dorsiflexion - left;Steppage;Narrow base of support Gait velocity: slowed Gait velocity interpretation: Below normal speed for age/gender General Gait Details: supervision, vc for increased BoS to increase stability with ambulation,       Balance Overall balance assessment: Needs assistance (see exercises) Sitting-balance support: No upper extremity supported;Feet supported Sitting balance-Leahy Scale: Normal     Standing balance support: During functional activity;No upper extremity supported Standing balance-Leahy Scale: Good                              Cognition Arousal/Alertness: Awake/alert Behavior During Therapy: WFL for tasks assessed/performed Overall Cognitive Status: Within Functional Limits for tasks assessed                                        Exercises Other Exercises Other Exercises: standing balance 2x 30 sec Other Exercises: standing balance eyes closed, 3 x 10 sec Other Exercises: R single leg stance 2x 15 sec    General Comments General comments (skin integrity, edema, etc.): 2/4 DoE  with gait, max HR 94 bpm SaO2 on RA 90%O2      Pertinent Vitals/Pain Pain Assessment: No/denies pain           PT Goals (current goals can now be found in the care plan section) Acute Rehab PT Goals Patient Stated Goal: go to the beach soon PT Goal Formulation: With patient Time For Goal Achievement: 05/09/17 Potential to Achieve Goals: Good Progress towards PT goals: Progressing toward goals    Frequency    Min  3X/week      PT Plan Current plan remains appropriate       AM-PAC PT "6 Clicks" Daily Activity  Outcome Measure  Difficulty turning over in bed (including adjusting bedclothes, sheets and blankets)?: A Little Difficulty moving from lying on back to sitting on the side of the bed? : A Lot Difficulty sitting down on and standing up from a chair with arms (e.g., wheelchair, bedside commode, etc,.)?: None Help needed moving to and from a bed to chair (including a wheelchair)?: None Help needed walking in hospital room?: A Little Help needed climbing 3-5 steps with a railing? : A Lot 6 Click Score: 18    End of Session Equipment Utilized During Treatment: Gait belt Activity Tolerance: Patient tolerated treatment well Patient left: in chair;with call bell/phone within reach;with family/visitor present Nurse Communication: Mobility status PT Visit Diagnosis: Unsteadiness on feet (R26.81);Other abnormalities of gait and mobility (R26.89);History of falling (Z91.81);Muscle weakness (generalized) (M62.81);Difficulty in walking, not elsewhere classified (R26.2);Other symptoms and signs involving the nervous system (X21.194)     Time: 1740-8144 PT Time Calculation (min) (ACUTE ONLY): 14 min  Charges:  $Gait Training: 8-22 mins                    G Codes:       Seif Teichert B. Migdalia Dk PT, DPT Acute Rehabilitation  [phone] Pager [phone]     [place] 05/05/2017, 3:01 PM

## 2017-05-05 NOTE — Care Management Note (Signed)
Case Management Note  Patient Details  Name: Shane [name] MRN: [id number] Date of Birth: 02-14-1934  Subjective/Objective:       CHF           Action/Plan: CM talked to patient with his spouse at the bedside to offer [place] choice; spouse chose [facility]; [place] with [facility] called for arrangements; has private insurance with Psychologist, prison and probation services with prescription drug coverage; pharmacy of choice is [facility]; spouse reports no problems with his medication.   Expected Discharge Date:    possibly 05/07/2017              Expected Discharge Plan:  [place]  In-House Referral:   [facility]  Discharge planning Services  CM Consult  Choice offered to:  Spouse   HH Arranged:  RN, Disease Management, PT [place] Agency:  [facility] (now [facility])  Status of Service:  In process, will continue to follow Sherrilyn Rist [phone] 05/05/2017, 11:35 AM

## 2017-05-05 NOTE — Progress Notes (Signed)
[facility] Rounding Note  Subjective:   Feels better Creatinine down some, BUN no worse Excellent output again past 24 hours (2450 with 1725 so far today)    Objective Vital signs in last 24 hours: Vitals:   05/04/17 1300 05/04/17 2026 05/05/17 0538 05/05/17 1243  BP: (!) 121/58 138/70 127/64 (!) 118/43  Pulse: 77 79 82 75  Resp: 20 18 18 18   Temp: 98 F (36.7 C) 98 F (36.7 C) 97.8 F (36.6 C) 98.2 F (36.8 C)  TempSrc: Oral Tympanic Oral Oral  SpO2: 95% 96% 92% 93%  Weight:   94 kg (207 lb 3.2 oz)   Height:       Weight change: -1.542 kg (-3 lb 6.4 oz)  Intake/Output Summary (Last 24 hours) at 05/05/17 1708 Last data filed at 05/05/17 1449  Gross per 24 hour  Intake             1500 ml  Output             4475 ml  Net            -2975 ml   Physical Exam: Very nice older gentleman. NAD VS as noted Lungs crossly clear Regular S1S2 No S3 Abd non distended, non tender 1+ edema R UE AVF + bruit and thrill     Recent Labs Lab 05/03/17 0451 05/04/17 0220 05/05/17 0540  NA 136 137  138 137  K 3.1* 3.3*  3.4* 3.5  CL 95* 94*  95* 93*  CO2 30 32  32 30  GLUCOSE 141* 140*  143* 131*  BUN 109* 104*  104* 99*  CREATININE 3.17* 3.00*  3.05* 2.82*  CALCIUM 8.7* 8.9  9.0 9.2  PHOS  --  3.3 3.0     Recent Labs Lab 05/04/17 0220 05/05/17 0540  ALBUMIN 3.5 3.5    Recent Labs Lab 05/01/17 1108  AMMONIA 35    Recent Labs Lab 05/01/17 1108  WBC 6.8  HGB 12.0*  HCT 38.8*  MCV 92.6  PLT 92*     Recent Labs Lab 05/04/17 1603 05/04/17 2133 05/05/17 0749 05/05/17 1200 05/05/17 1641  GLUCAP 79 132* 141* 169* 103*    Medications: Infusions: . sodium chloride    . furosemide Stopped (05/05/17 0626)    Scheduled Medications: . amLODipine  5 mg Oral Daily  . aspirin EC  81 mg Oral Daily  . atorvastatin  10 mg Oral Daily  . carvedilol  6.25 mg Oral BID WC  . enoxaparin (LOVENOX) injection  30 mg Subcutaneous Q24H  . gabapentin  300 mg Oral  QHS  . hydrALAZINE  25 mg Oral TID  . insulin aspart  0-9 Units Subcutaneous TID WC  . loratadine  10 mg Oral Daily  . potassium chloride  40 mEq Oral BID  . sodium chloride flush  3 mL Intravenous Q12H     Assessment/Plan:  83 year old white male PMH HTN, DM, CAD/prior CABG, sHF EF 40-45%, AICD in place. CKD 4 followed by Dr. Hollie Salk. S/p AVF placement 09/2016 in anticipatoin of eventual HD need. Home preference (eventually) HHD. Admitted on current occasion with volume overload issues, worsening renal function.   1. Advanced CKD4 - pt had been agreeable to proceed with HD earlier in the admission but UOP improved so held off on 9/29 and over past 2 days UOP has remained good and creatinine has actually dropped a bit. Not clinically uremic. He may escape this hosp without HD and be able to follow  up with Dr. Hollie Salk. Getting IV lasix for 1 more day per cards (120 IV BID) 2. Hypokalemia - on BID 40 replacement 3. BP/volume - amlodipine had been reduced to allow for sl higher BP (may have helped w/renal perfusion). On this, low dose carvedilol 4. Anemia - Hb 12 last check, not current issue 5. Secondary HPT - PTH 77. No intervention needed at this time.  Jamal Maes, MD [facility] [phone] Pager 05/05/2017, 5:19 PM

## 2017-05-05 NOTE — Progress Notes (Signed)
Patient ID: Shane [name], male   DOB: April 01, 1934, 83 y.o.   MRN: [id number]    Advanced Heart Failure Rounding Note   Subjective:    Feeling "much better". Denies SOB around room. Having some mild soreness in his back, but thinks it is from hospital bed. Denies lightheadedness or dizziness.   Creatinine 3.00 -> 2.82. BUN 99. K 3.5. Good UO yesterday (3.4 L). Down another 3 lbs.   Objective:   Weight Range:  Vital Signs:   Temp:  [97.8 F (36.6 C)-98 F (36.7 C)] 97.8 F (36.6 C) (10/01 0538) Pulse Rate:  [77-82] 82 (10/01 0538) Resp:  [18-20] 18 (10/01 0538) BP: (121-138)/(58-70) 127/64 (10/01 0538) SpO2:  [92 %-96 %] 92 % (10/01 0538) Weight:  [207 lb 3.2 oz (94 kg)] 207 lb 3.2 oz (94 kg) (10/01 0538) Last BM Date: 05/03/17  Weight change: Filed Weights   05/03/17 0536 05/04/17 0544 05/05/17 0538  Weight: 215 lb 6.4 oz (97.7 kg) 210 lb 9.6 oz (95.5 kg) 207 lb 3.2 oz (94 kg)    Intake/Output:   Intake/Output Summary (Last 24 hours) at 05/05/17 0754 Last data filed at 05/05/17 0600  Gross per 24 hour  Intake             1567 ml  Output             3450 ml  Net            -1883 ml     Physical Exam: General: Well appearing. No resp difficulty. HEENT: Normal Neck: Supple. JVP 9-10 cm. Carotids 2+ bilat; no bruits. No thyromegaly or nodule noted. Cor: PMI nondisplaced. RRR, 1/6 SEM RUSB.  Lungs: CTAB, normal effort. Abdomen: Soft, non-tender, non-distended, no HSM. No bruits or masses. +BS  Extremities: No cyanosis, clubbing, or rash. 1+ ankle edema.  Neuro: Alert & orientedx3, cranial nerves grossly intact. moves all 4 extremities w/o difficulty. Affect pleasant   Telemetry:  NSR 70s, Personally reviewed.   Labs: Basic Metabolic Panel:  Recent Labs Lab 05/01/17 1108 05/02/17 0427 05/03/17 0451 05/04/17 0220 05/05/17 0540  NA 131* 134* 136 137  138 137  K 3.2* 3.8 3.1* 3.3*  3.4* 3.5  CL 89* 92* 95* 94*  95* 93*  CO2 28 30 30  32  32 30  GLUCOSE  303* 158* 141* 140*  143* 131*  BUN 103* 108* 109* 104*  104* 99*  CREATININE 3.15* 3.32* 3.17* 3.00*  3.05* 2.82*  CALCIUM 8.6* 8.6* 8.7* 8.9  9.0 9.2  MG  --  2.5*  --   --   --   PHOS  --   --   --  3.3 3.0    Liver Function Tests:  Recent Labs Lab 05/04/17 0220 05/05/17 0540  ALBUMIN 3.5 3.5   No results for input(s): LIPASE, AMYLASE in the last 168 hours.  Recent Labs Lab 05/01/17 1108  AMMONIA 35    CBC:  Recent Labs Lab 05/01/17 1108  WBC 6.8  HGB 12.0*  HCT 38.8*  MCV 92.6  PLT 92*    Cardiac Enzymes: No results for input(s): CKTOTAL, CKMB, CKMBINDEX, TROPONINI in the last 168 hours.  BNP: BNP (last 3 results)  Recent Labs  05/01/17 1108  BNP 712.3*    ProBNP (last 3 results) No results for input(s): PROBNP in the last 8760 hours.    Other results:  Imaging: No results found.   Medications:     Scheduled Medications: . amLODipine  5 mg  Oral Daily  . aspirin EC  81 mg Oral Daily  . atorvastatin  10 mg Oral Daily  . carvedilol  6.25 mg Oral BID WC  . enoxaparin (LOVENOX) injection  30 mg Subcutaneous Q24H  . gabapentin  300 mg Oral QHS  . hydrALAZINE  25 mg Oral TID  . insulin aspart  0-9 Units Subcutaneous TID WC  . loratadine  10 mg Oral Daily  . potassium chloride  40 mEq Oral BID  . sodium chloride flush  3 mL Intravenous Q12H    Infusions: . sodium chloride    . furosemide Stopped (05/04/17 1753)    PRN Medications: sodium chloride, acetaminophen, albuterol, bisacodyl, ondansetron (ZOFRAN) IV, sodium chloride flush, zolpidem   Assessment:    Shane [name] is an 83 year old with CAD s/p CABG, systolic HF EF 67-67% and CKD 4 admitted with marked volume overload and worsening renal function. .    Plan/Discussion:     1. Acute/chronic systolic congestive heart failure: Due to ischemic cardiomyopathy. EF 40-45% echo 4/17. S/p ICD.  Renal function worsened this admisison.   - Remains volume overloaded on exam, but  continues to improve.  - Continue Lasix 120 mg IV BID for at least today.  - No immediate indication for HD initiation.  2. Coronary artery disease: History of remote coronary artery bypass grafting 1985.  - No s/s of ischemia.    -  Last cath 2003 LM and RCA occluded. LIMA to LAD ok. RIMA to OM ok SVG to PDA occluded.  3. Acute on CKD 4: Creatinine baseline 2.5-2.8 due to cardio-renal syndrome.  - Followed by Dr. Hollie Salk in Nephrology. Has AVF in place. - Now responding to IV lasix. No uremia. BUN/creatinine appear to have plateaued.  Continue to follow closely.  - At this point, does not need HD initiation.  Will follow closely as he needs more fluid off but responding now to Lasix.  4. Diabetes mellitus - Continue current regimen. Cover with SSI while inpatient.  5. HTN:  - Stable on current meds. Want to preserve renal perfusion.   Length of Stay: 4  Annamaria Helling  05/05/2017, 7:54 AM  Advanced Heart Failure Team Pager [phone] (M-F; 7a - 4p)  Please contact [facility] for night-coverage after hours (4p -7a ) and weekends on [email or web address]  Patient seen and examined with the above-signed Advanced Practice Provider and/or Housestaff. I personally reviewed laboratory data, imaging studies and relevant notes. I independently examined the patient and formulated the important aspects of the plan. I have edited the note to reflect any of my changes or salient points. I have personally discussed the plan with the patient and/or family.  Overall improving slowly. Wil await Renal input but I think best plan is to give him one more day of IV abx then place back on po and see if he can maintain his drive weight with stable renal indices. Would let SBP stay on the upper end of normal.   Glori Bickers, MD  8:34 AM

## 2017-05-06 ENCOUNTER — Telehealth: Payer: Self-pay | Admitting: *Deleted

## 2017-05-06 ENCOUNTER — Other Ambulatory Visit: Payer: Medicare Other

## 2017-05-06 LAB — CBC
HCT: 43.2 % (ref 39.0–52.0)
HEMOGLOBIN: 13.4 g/dL (ref 13.0–17.0)
MCH: 29.1 pg (ref 26.0–34.0)
MCHC: 31 g/dL (ref 30.0–36.0)
MCV: 93.9 fL (ref 78.0–100.0)
PLATELETS: 113 10*3/uL — AB (ref 150–400)
RBC: 4.6 MIL/uL (ref 4.22–5.81)
RDW: 14.1 % (ref 11.5–15.5)
WBC: 7.7 10*3/uL (ref 4.0–10.5)

## 2017-05-06 LAB — GLUCOSE, CAPILLARY: Glucose-Capillary: 117 mg/dL — ABNORMAL HIGH (ref 65–99)

## 2017-05-06 LAB — RENAL FUNCTION PANEL
ANION GAP: 10 (ref 5–15)
Albumin: 3.8 g/dL (ref 3.5–5.0)
BUN: 90 mg/dL — ABNORMAL HIGH (ref 6–20)
CHLORIDE: 96 mmol/L — AB (ref 101–111)
CO2: 34 mmol/L — AB (ref 22–32)
CREATININE: 2.83 mg/dL — AB (ref 0.61–1.24)
Calcium: 9.2 mg/dL (ref 8.9–10.3)
GFR, EST AFRICAN AMERICAN: 23 mL/min — AB (ref >60)
GFR, EST NON AFRICAN AMERICAN: 19 mL/min — AB (ref >60)
Glucose, Bld: 135 mg/dL — ABNORMAL HIGH (ref 65–99)
Phosphorus: 3.6 mg/dL (ref 2.5–4.6)
Potassium: 3.7 mmol/L (ref 3.5–5.1)
Sodium: 140 mmol/L (ref 135–145)

## 2017-05-06 MED ORDER — CARVEDILOL 6.25 MG PO TABS: 6.2500 mg | ORAL_TABLET | Freq: Two times a day (BID) | ORAL | 5 refills | Status: DC

## 2017-05-06 MED ORDER — AMLODIPINE BESYLATE 5 MG PO TABS: 5.0000 mg | ORAL_TABLET | Freq: Every day | ORAL | 6 refills | Status: DC

## 2017-05-06 MED ORDER — METOLAZONE 5 MG PO TABS: ORAL_TABLET | ORAL | 3 refills | Status: DC

## 2017-05-06 MED ORDER — HYDRALAZINE HCL 25 MG PO TABS: 25.0000 mg | ORAL_TABLET | Freq: Three times a day (TID) | ORAL | 5 refills | Status: DC

## 2017-05-06 MED ORDER — TORSEMIDE 20 MG PO TABS: 80.0000 mg | ORAL_TABLET | Freq: Every day | ORAL | 5 refills | Status: DC

## 2017-05-06 MED ORDER — POTASSIUM CHLORIDE CRYS ER 20 MEQ PO TBCR: 20.0000 meq | EXTENDED_RELEASE_TABLET | Freq: Once | ORAL | Status: DC

## 2017-05-06 NOTE — Telephone Encounter (Signed)
Pt was on TCM listed admitted 05/01/17 for Acute on chronic systolic CHF,with marked volume overload and worsening renal function.Pt has hx of CAD s/p CABG. Pt was D/C 05/06/17, and will follow-up w/specialist @ [facility] on 05/13/2017...Johny Chess

## 2017-05-06 NOTE — Discharge Summary (Signed)
Advanced Heart Failure Discharge Note  Discharge Summary   Patient ID: Shane [name] MRN: [id number], DOB/AGE: 05-Oct-1933 83 y.o. Admit date: 05/01/2017 D/C date:     05/06/2017   Primary Discharge Diagnoses:  1. Acute on chronic systolic CHF 2. CAD s/p CABG 3. ARF on CKD 4 4. DM2 5. HTN  Hospital Course:   Shane [name] is an 83 year old with CAD s/p CABG, systolic HF EF 44-01% and CKD 4 admitted with marked volume overload and worsening renal function.  Labs on admission concerning for cardiorenal syndrome. Discussed with Dr. Hollie [name] (His nephrologist) who agreed on admission for IV lasix trial. (He does have a patent fistula in place for initiation of HD once required).  Pt initially had poor response to IV lasix and Renal consulted with plans for HD initiation. However on day 2, he began to respond to high-dose lasix with increased urine output and decreasing BUN/Cr. BP active meds cut back to allow more room for renal perfusion.   Overall he diuresed 9.6 L and down 12 lbs on lasix 120 mg IV BID. Pt examined am of 05/06/17 and thought stable for d/c home after on additional dose of IV lasix. Will continue same home diuretic regimen on daily torsemide with as needed metolazone. Will need close follow up with Nephrology to discuss timing of HD initiation as needed.   He will be discharged to home today in stable condition with close follow up as below.  Discharge Weight Range: 206 lbs Discharge Vitals: Blood pressure 132/61, pulse 78, temperature 98 F (36.7 C), temperature source Oral, resp. rate 14, height 5\' 10"  (1.778 m), weight 206 lb (93.4 kg), SpO2 94 %.  Labs: Lab Results  Component Value Date   WBC 7.7 05/06/2017   HGB 13.4 05/06/2017   HCT 43.2 05/06/2017   MCV 93.9 05/06/2017   PLT 113 (L) 05/06/2017     Recent Labs Lab 05/06/17 0531  NA 140  K 3.7  CL 96*  CO2 34*  BUN 90*  CREATININE 2.83*  CALCIUM 9.2  GLUCOSE 135*   Lab Results  Component Value Date     CHOL 104 (L) 09/18/2015   HDL 25 (L) 09/18/2015   LDLCALC 60 09/18/2015   TRIG 97 09/18/2015   BNP (last 3 results)  Recent Labs  05/01/17 1108  BNP 712.3*    ProBNP (last 3 results) No results for input(s): PROBNP in the last 8760 hours.   Diagnostic Studies/Procedures   No results found.  Discharge Medications   Allergies as of 05/06/2017      Reactions   Codeine Other (See Comments)   anxiety   Hydrocodone Other (See Comments)   anxiety   Pseudoephedrine Other (See Comments)   Causes heart to race   Azithromycin Rash      Medication List    STOP taking these medications   diphenhydrAMINE 25 mg capsule Commonly known as:  BENADRYL     TAKE these medications   acetaminophen 500 MG tablet Commonly known as:  TYLENOL Take 1,000 mg by mouth every 8 (eight) hours as needed (pain).   albuterol 108 (90 Base) MCG/ACT inhaler Commonly known as:  PROVENTIL HFA;VENTOLIN HFA Inhale 2 puffs into the lungs every 4 (four) hours as needed for wheezing or shortness of breath (cough, shortness of breath or wheezing.). What changed:  reasons to take this   amLODipine 5 MG tablet Commonly known as:  NORVASC Take 1 tablet (5 mg total) by mouth daily. What  changed:  medication strength  how much to take   aspirin EC 81 MG tablet Take 81 mg by mouth daily.   atorvastatin 10 MG tablet Commonly known as:  LIPITOR Take 1 tablet (10 mg total) by mouth daily.   carvedilol 6.25 MG tablet Commonly known as:  COREG Take 1 tablet (6.25 mg total) by mouth 2 (two) times daily with a meal. What changed:  medication strength  how much to take  when to take this   cetirizine 10 MG tablet Commonly known as:  ZYRTEC Take 10 mg by mouth at bedtime as needed (seasonal allergies).   fluocinonide cream 0.05 % Commonly known as:  LIDEX Apply 1 application topically 2 (two) times daily as needed (for itchy rash).   gabapentin 300 MG capsule Commonly known as:   NEURONTIN Take 300 mg by mouth at bedtime.   hydrALAZINE 25 MG tablet Commonly known as:  APRESOLINE Take 1 tablet (25 mg total) by mouth 3 (three) times daily. What changed:  medication strength  how much to take   metolazone 5 MG tablet Commonly known as:  ZAROXOLYN Take 1 pill as directed by HF clinic if weight goes up 3 lbs overnight, or 5 lbs within one week. Call before taking. What changed:  additional instructions   NASACORT AQ 55 MCG/ACT Aero nasal inhaler Generic drug:  triamcinolone Place 2 sprays into the nose daily as needed (seasonal allergies).   omalizumab 150 MG injection Commonly known as:  XOLAIR Inject 300 mg into the skin every 14 (fourteen) days. Last injection given 04/25/17   potassium chloride 10 MEQ CR capsule Commonly known as:  MICRO-K Take 10 mEq by mouth daily as needed (with each metolazone dose).   tadalafil 20 MG tablet Commonly known as:  CIALIS Take 20 mg by mouth daily as needed for erectile dysfunction. Do not exceed 2 doses in 7 days   testosterone cypionate 100 MG/ML injection Commonly known as:  DEPOTESTOTERONE CYPIONATE Inject 400 mg into the muscle every 21 ( twenty-one) days. For IM use only   torsemide 20 MG tablet Commonly known as:  DEMADEX Take 4 tablets (80 mg total) by mouth daily.   zolpidem 10 MG tablet Commonly known as:  AMBIEN TAKE 1/2 TO 1 (ONE-HALF TO ONE) TABLET BY MOUTH AT BEDTIME AS NEEDED FOR SLEEP What changed:  See the new instructions.       Disposition   The patient will be discharged in stable condition to home. Discharge Instructions    (HEART FAILURE PATIENTS) Call MD:  Anytime you have any of the following symptoms: 1) 3 pound weight gain in 24 hours or 5 pounds in 1 week 2) shortness of breath, with or without a dry hacking cough 3) swelling in the hands, feet or stomach 4) if you have to sleep on extra pillows at night in order to breathe.    Complete by:  As directed    Diet - low sodium heart  healthy    Complete by:  As directed    Heart Failure patients record your daily weight using the same scale at the same time of day    Complete by:  As directed    Increase activity slowly    Complete by:  As directed      Follow-up Information    [facility] up.   Specialty:  [place] Why:  They will do your home health care at your home Contact information: [place]  [address] [place] [address] [phone]        [facility] Follow up on 05/13/2017.   Specialty:  Cardiology Why:  at 1000 am for post hospital follow up. The code for parking is 8000. Please bring all of your medications to your visit.  Contact information: [address] [phone] [place] [phone]            Duration of Discharge Encounter: Greater than 35 minutes   Signed, Annamaria Helling 05/06/2017, 8:23 AM   Agree.  He is ready for discharge. Will need close f/u with Nephrology to follow for timing of HD start.   Glori Bickers, MD  5:23 PM

## 2017-05-06 NOTE — Progress Notes (Signed)
Going over pt discharge paperwork Pt IV site redden and harden to touch Pt given heat pack by RN Paged MD to inform  Pt wanting to discharge at this time Awaiting call back

## 2017-05-06 NOTE — Progress Notes (Signed)
Patient ID: Shane [name], male   DOB: [birth year], 83 y.o.   MRN: [id number]    Advanced Heart Failure Rounding Note   Subjective:    Resting comfortably. No SOB.  Weight down another pound. Creatinine stable at 2.83. BUN down to 90 - not far from baseline  Objective:   Weight Range:  Vital Signs:   Temp:  [98 F (36.7 C)-98.4 F (36.9 C)] 98 F (36.7 C) (10/02 0408) Pulse Rate:  [75-79] 78 (10/02 0408) Resp:  [14-18] 14 (10/02 0408) BP: (118-137)/(43-61) 132/61 (10/02 0408) SpO2:  [93 %-94 %] 94 % (10/02 0408) Weight:  [93.4 kg (206 lb)] 93.4 kg (206 lb) (10/02 0408) Last BM Date: 05/04/17  Weight change: Filed Weights   05/04/17 0544 05/05/17 0538 05/06/17 0408  Weight: 95.5 kg (210 lb 9.6 oz) 94 kg (207 lb 3.2 oz) 93.4 kg (206 lb)    Intake/Output:   Intake/Output Summary (Last 24 hours) at 05/06/17 0657 Last data filed at 05/06/17 0408  Gross per 24 hour  Intake             1020 ml  Output             4150 ml  Net            -3130 ml     Physical Exam: General: Lying in bed  No resp difficulty HEENT: normal Neck: supple. JVP 7-8 Carotids 2+ bilat; no bruits. No lymphadenopathy or thryomegaly appreciated. Cor: PMI nondisplaced. Regular rate & rhythm. No rubs, gallops or murmurs. Lungs: clear Abdomen: soft, nontender, nondistended. No hepatosplenomegaly. No bruits or masses. Good bowel sounds. Extremities: no cyanosis, clubbing, rash, trace edema + RUE AVF Neuro: alert & orientedx3, cranial nerves grossly intact. moves all 4 extremities w/o difficulty. Affect pleasant   Telemetry:  NSR 70-80, Personally reviewed   Labs: Basic Metabolic Panel:  Recent Labs Lab 05/02/17 0427 05/03/17 0451 05/04/17 0220 05/05/17 0540 05/06/17 0531  NA 134* 136 137  138 137 140  K 3.8 3.1* 3.3*  3.4* 3.5 3.7  CL 92* 95* 94*  95* 93* 96*  CO2 30 30 32  32 30 34*  GLUCOSE 158* 141* 140*  143* 131* 135*  BUN 108* 109* 104*  104* 99* 90*  CREATININE 3.32*  3.17* 3.00*  3.05* 2.82* 2.83*  CALCIUM 8.6* 8.7* 8.9  9.0 9.2 9.2  MG 2.5*  --   --   --   --   PHOS  --   --  3.3 3.0 3.6    Liver Function Tests:  Recent Labs Lab 05/04/17 0220 05/05/17 0540 05/06/17 0531  ALBUMIN 3.5 3.5 3.8   No results for input(s): LIPASE, AMYLASE in the last 168 hours.  Recent Labs Lab 05/01/17 1108  AMMONIA 35    CBC:  Recent Labs Lab 05/01/17 1108 05/06/17 0531  WBC 6.8 7.7  HGB 12.0* 13.4  HCT 38.8* 43.2  MCV 92.6 93.9  PLT 92* 113*    Cardiac Enzymes: No results for input(s): CKTOTAL, CKMB, CKMBINDEX, TROPONINI in the last 168 hours.  BNP: BNP (last 3 results)  Recent Labs  05/01/17 1108  BNP 712.3*    ProBNP (last 3 results) No results for input(s): PROBNP in the last 8760 hours.    Other results:  Imaging: No results found.   Medications:     Scheduled Medications: . amLODipine  5 mg Oral Daily  . aspirin EC  81 mg Oral Daily  . atorvastatin  10 mg  Oral Daily  . carvedilol  6.25 mg Oral BID WC  . enoxaparin (LOVENOX) injection  30 mg Subcutaneous Q24H  . gabapentin  300 mg Oral QHS  . hydrALAZINE  25 mg Oral TID  . insulin aspart  0-9 Units Subcutaneous TID WC  . loratadine  10 mg Oral Daily  . potassium chloride  40 mEq Oral BID  . sodium chloride flush  3 mL Intravenous Q12H    Infusions: . sodium chloride    . furosemide 120 mg (05/05/17 1756)    PRN Medications: sodium chloride, acetaminophen, albuterol, bisacodyl, ondansetron (ZOFRAN) IV, sodium chloride flush, zolpidem   Assessment:    Mr Shane [name] is an 83 year old with CAD s/p CABG, systolic HF EF 32-44% and CKD 4 admitted with marked volume overload and worsening renal function. .    Plan/Discussion:     1. Acute/chronic systolic congestive heart failure: Due to ischemic cardiomyopathy. EF 40-45% echo 4/17. S/p ICD.  Renal function worsened this admisison.   - Volume status much improved. Creatinine back to baseline. - I don't  think we can get him much better than this. Will give one more dose IV lasix this am and then send him home back on his home torsemide dose. Will need close f/u with Dr. Hollie [name]  2. Coronary artery disease: History of remote coronary artery bypass grafting 1985.  - No s/s of ischemia.    -  Last cath 2003 LM and RCA occluded. LIMA to LAD ok. RIMA to OM ok SVG to PDA occluded.  3. Acute on CKD 4: Creatinine baseline 2.5-2.8 due to cardio-renal syndrome.  - Followed by Dr. Hollie [name] in Nephrology. Has AVF in place. - Has responded well to IV lasix. No uremia. BUN/creatinine nearing baseline. Looks like he has escaped initiation of HD this admit but likely not far off. F/u with Dr. Hollie [name].  4. Diabetes mellitus - Continue current regimen. Cover with SSI while inpatient.  5. HTN:  - Stable on current meds which are cut back from admit to preserve renal perfusion. Will d/c on current regimen.   Length of Stay: 5  Shane Bickers, MD  05/06/2017, 6:57 AM  Advanced Heart Failure Team Pager [phone] (M-F; 7a - 4p)  Please contact [facility] for night-coverage after hours (4p -7a ) and weekends on [email or web address]

## 2017-05-06 NOTE — Progress Notes (Signed)
Talked to Jermyn stated to give IVPB lasix prior to discharge

## 2017-05-06 NOTE — Progress Notes (Signed)
Reviewed discharge instructions with patient and patient's wife. Answered their questions. Patient is stable and ready for discharge.

## 2017-05-07 ENCOUNTER — Ambulatory Visit (INDEPENDENT_AMBULATORY_CARE_PROVIDER_SITE_OTHER): Payer: Medicare Other | Admitting: Neurology

## 2017-05-07 ENCOUNTER — Ambulatory Visit (INDEPENDENT_AMBULATORY_CARE_PROVIDER_SITE_OTHER): Payer: Self-pay | Admitting: Neurology

## 2017-05-07 ENCOUNTER — Telehealth: Payer: Self-pay | Admitting: Internal Medicine

## 2017-05-07 ENCOUNTER — Encounter: Payer: Self-pay | Admitting: Neurology

## 2017-05-07 DIAGNOSIS — E1142 Type 2 diabetes mellitus with diabetic polyneuropathy: Secondary | ICD-10-CM

## 2017-05-07 DIAGNOSIS — R269 Unspecified abnormalities of gait and mobility: Secondary | ICD-10-CM

## 2017-05-07 NOTE — Telephone Encounter (Signed)
Darlina Guys from [facility] called stating that they received a referral for home health services on the 1st. She said that any time they are not able to see the pt within 48 hours, they have to notify us. She said that the RN is able to go out to start the pt with home health services on the 5th if this is okay with Dr [name].

## 2017-05-07 NOTE — Procedures (Signed)
     HISTORY:  Shane [name] is an 83 year old gentleman with history of diabetes who reports a history of numbness in the legs and a history of gait instability. He has a history of 4 prior lumbosacral spine surgery procedures. He is being evaluated for a possible neuropathy or a lumbosacral radiculopathy.  NERVE CONDUCTION STUDIES:  Nerve conduction studies were performed on the left upper extremity. The distal motor latencies for the left median nerve was within normal limits with a normal motor amplitude for this nerve. The distal motor latency for the left ulnar nerve was slightly prolonged with a slightly low motor amplitude. Slowing was seen for the left ulnar nerve above and below the elbow and nerve conduction velocities for the left median nerve were normal. The sensory latencies for the left median and ulnar nerves were prolonged. The F wave latencies for these nerves were also prolonged.  Nerve conduction studies were performed on both lower extremities. No response was seen for the peroneal or posterior tibial nerves bilaterally. No response was seen for the peroneal sensory latencies bilaterally.  EMG STUDIES:  EMG study was performed on the left lower extremity:  The tibialis anterior muscle reveals 2 to 5K motor units with significant decreased recruitment. No fibrillations or positive waves were seen. The peroneus tertius muscle reveals 2 to 3K motor units with severely reduced recruitment. 1+ fibrillations and positive waves were seen. The medial gastrocnemius muscle reveals no voluntary motor units with no recruitment. No fibrillations or positive waves were seen. The vastus lateralis muscle reveals 2 to 5K motor units with slightly reduced recruitment. No fibrillations or positive waves were seen. The iliopsoas muscle reveals 2 to 4K motor units with full recruitment. No fibrillations or positive waves were seen. The biceps femoris muscle (long head) reveals 2 to 5K motor  units with slightly reduced recruitment. No fibrillations or positive waves were seen. The lumbosacral paraspinal muscles were tested at 3 levels, and revealed no abnormalities of insertional activity at all 3 levels tested. There was good relaxation.   IMPRESSION:  Nerve conduction studies done on the left upper extremity and both lower extremities shows evidence of a severe primarily axonal peripheral neuropathy. EMG evaluation of the left lower extremity shows severe distal and mild acute denervation consistent with the diagnosis of peripheral neuropathy. There may be evidence of an overlying chronic S1 and L5 radiculopathy.  Shane Alexanders MD 05/07/2017 1:57 PM  [facility] [address] [place], [place] [address]  Phone [phone] Fax [phone]

## 2017-05-07 NOTE — Progress Notes (Signed)
Please refer to EMG and nerve conduction study procedure note. 

## 2017-05-07 NOTE — Telephone Encounter (Signed)
That's ok.

## 2017-05-07 NOTE — Progress Notes (Addendum)
The patient comes in today for EMG and nerve conduction study evaluation. This study shows evidence of a severe axonal peripheral neuropathy, likely secondary to diabetes. There may be an overlying L5 and S1 radiculopathy, most severe at the S1 level.  The patient will be engaging in physical therapy for gait training. The patient is at risk for falls, he should be using a cane when outside the house.    [place]    Nerve / Sites Muscle Latency Ref. Amplitude Ref. Rel Amp Segments Distance Velocity Ref. Area    ms ms mV mV %  cm m/s m/s mVms  L Median - APB     Wrist APB 3.6 ?4.4 11.1 ?4.0 100 Wrist - APB 7   41.1     Upper arm APB 8.5  10.5  95.1 Upper arm - Wrist 26 53 ?49 39.8  L Ulnar - ADM     Wrist ADM 3.5 ?3.3 5.8 ?6.0 100 Wrist - ADM 7   20.6     B.Elbow ADM 8.8  4.3  74.2 B.Elbow - Wrist 23 43 ?49 15.1     A.Elbow ADM 11.1  4.6  107 A.Elbow - B.Elbow 10 43 ?49 15.5         A.Elbow - Wrist      L Peroneal - EDB     Ankle EDB NR ?6.5 NR ?2.0 NR Ankle - EDB 9   NR     Fib head EDB NR  NR  NR Fib head - Ankle   ?44 NR     Pop fossa EDB NR  NR  NR Pop fossa - Fib head   ?44 NR         Pop fossa - Ankle      R Peroneal - EDB     Ankle EDB NR ?6.5 NR ?2.0 NR Ankle - EDB 9   NR     Fib head EDB NR  NR  NR Fib head - Ankle   ?44 NR     Pop fossa EDB NR  NR  NR Pop fossa - Fib head   ?44 NR         Pop fossa - Ankle      L Tibial - AH     Ankle AH NR ?5.8 NR ?4.0 NR Ankle - AH 9   NR     Pop fossa AH NR  NR  NR Pop fossa - Ankle   ?41 NR  R Tibial - AH     Ankle AH NR ?5.8 NR ?4.0 NR Ankle - AH 9   NR     Pop fossa AH NR  NR  NR Pop fossa - Ankle   ?[place]    Nerve / Sites Rec. Site Peak Lat Ref.  Amp Ref. Segments Distance    ms ms V V  cm  R Superficial peroneal - Ankle     Lat leg Ankle NR ?4.4 NR ?6 Lat leg - Ankle 14  L Superficial peroneal - Ankle     Lat leg Ankle NR ?4.4 NR ?6 Lat leg - Ankle 14         SNC    Nerve / Sites Rec. Site Peak Lat Amp  Segments Distance    ms V  cm  L Median - Orthodromic (Dig II, Mid palm)     Dig II Wrist  4.9 4 Dig II - Wrist 13  L Ulnar - Orthodromic, (Dig V, Mid palm)     Dig V Wrist 6.0 23 Dig V - Wrist 26         F  Wave    Nerve F Lat Ref.   ms ms  L Median - APB 33.3 ?31.0  L Ulnar - ADM 38.3 ?32.0

## 2017-05-07 NOTE — Telephone Encounter (Signed)
Notified Kecia w/MD response.../lmb 

## 2017-05-08 ENCOUNTER — Ambulatory Visit (INDEPENDENT_AMBULATORY_CARE_PROVIDER_SITE_OTHER): Payer: Medicare Other | Admitting: *Deleted

## 2017-05-08 DIAGNOSIS — I5042 Chronic combined systolic (congestive) and diastolic (congestive) heart failure: Secondary | ICD-10-CM

## 2017-05-08 DIAGNOSIS — I255 Ischemic cardiomyopathy: Secondary | ICD-10-CM | POA: Diagnosis not present

## 2017-05-08 DIAGNOSIS — Z95810 Presence of automatic (implantable) cardiac defibrillator: Secondary | ICD-10-CM

## 2017-05-08 NOTE — Progress Notes (Signed)
Remote ICD transmission.   

## 2017-05-08 NOTE — Progress Notes (Signed)
EPIC Encounter for ICM Monitoring  Patient Name: Shane [name] is a 83 y.o. male Date: 05/08/2017 Primary Care Physican: [name], MD Primary Cardiologist:Nahser/Bensimhon Electrophysiologist: Caryl Comes Nephrologist: Hollie Salk at [facility] Weight:  205lbs Bi-V Pacing: 89.7% (dropped from 92.3%on 03/31/2017)   Event Summary ?V. Pacing < 90%  ?16 V. Sensing Episodes  ?3 VT-NS  ?13 seconds in AT/AF Since Last Session        Heart Failure questions reviewed, pt asymptomatic since hospital discharge.   Hospitalized 05/01/2017 to 05/06/2017 for CHF exacerbation.  Discharge weight 206 lbs.   Thoracic impedance returned to normal on 05/04/2017 which correlates with being diuresed during hospitalization.   Prescribed dosage: Torsemide 20 mg 4 tablets (80 mg total) daily.  Metolazone 5 mg take 1 pill as directed by HF clinic if weight goes up 3 lbs overnight, or 5 lbs within one week. Call before taking.   Labs: 05/06/2017 Creatinine 2.83, BUN 90,   Potassium 3.7, Sodium 140, EGFR 19-23 05/05/2017 Creatinine 2.82, BUN 99,   Potassium 3.5, Sodium 137, EGFR 20-23 05/04/2017 Creatinine 3.00, BUN 104, Potassium 3.3, Sodium 137, EGFR 18-21  05/03/2017 Creatinine 3.17, BUN 109, Potassium 3.1, Sodium 136, EGFR 17-20  05/02/2017 Creatinine 3.32, BUN 108, Potassium 3.8, Sodium 134, EGFR 16-19  05/01/2017 Creatinine 3.15, BUN 103, Potassium 3.2, Sodium 131, EGFR 17-21  04/28/2017 Creatinine 2.96, BUN 90, Potassium 3.3, Sodium 138, EGFR 19-22  03/04/2017 Creatinine 2.39, BUN 67, Potassium 3.4, Sodium 137  01/08/2017 Creatinine 2.98, BUN 82, Potassium 3.0, Sodium 136, EGFR 18-21 12/10/2016 Creatinine 2.66, BUN 70, Potassium 3.5, Sodium 138, EGFR 21-24 12/03/2016 Creatinine 2.70, BUN 72, Potassium 3.5, Sodium 138, EGFR 21-24 11/04/2016 Creatinine 3.27, BUN 79, Potassium 3.9, Sodium 137, EGFR 16-19 10/18/2016 Creatinine 2.7, BUN 63. Potassium 3.6, Sodium 138 10/02/2016 Creatinine 2.43, BUN  66, Potassium 3.6, Sodium 139, EGFR 24-28 11/27/2017Creatinine 3.09, BUN 74, Potassium 4.3, Sodium 136 06/07/2016 Creatinine 2.59, BUN 65, Potassium 3.6, Sodium 139, EGFR 22-25  05/01/2016 Creatinine 2.64, BUN 48, Potassium 3.8, Sodium 137  04/24/2016 Creatinine 2.46, BUN 38, Potassium 3.9, Sodium 138, EGFR 23-27  03/26/2016 Creatinine 2.28, BUN 35, Potassium 4.2, Sodium 140   Recommendations: No changes.  Advised to limit salt intake to 2000 mg/day and fluid intake to < 2 liters/day.  Encouraged to call for fluid symptoms.  Follow-up plan: ICM clinic phone appointment on 05/26/2017.  Office appointment scheduled 05/13/2017 with HF clinic and 05/27/2017 with Dr. Acie Fredrickson.  Copy of ICM check sent to Dr. Caryl Comes and Dr Haroldine Laws.   3 month ICM trend: 05/08/2017   1 Year ICM trend:      Rosalene Billings, RN 05/08/2017 3:20 PM

## 2017-05-09 ENCOUNTER — Other Ambulatory Visit: Payer: Self-pay | Admitting: Internal Medicine

## 2017-05-09 DIAGNOSIS — I130 Hypertensive heart and chronic kidney disease with heart failure and stage 1 through stage 4 chronic kidney disease, or unspecified chronic kidney disease: Secondary | ICD-10-CM | POA: Diagnosis not present

## 2017-05-09 DIAGNOSIS — E1122 Type 2 diabetes mellitus with diabetic chronic kidney disease: Secondary | ICD-10-CM | POA: Diagnosis not present

## 2017-05-09 DIAGNOSIS — N184 Chronic kidney disease, stage 4 (severe): Secondary | ICD-10-CM | POA: Diagnosis not present

## 2017-05-09 DIAGNOSIS — I5043 Acute on chronic combined systolic (congestive) and diastolic (congestive) heart failure: Secondary | ICD-10-CM | POA: Diagnosis not present

## 2017-05-09 DIAGNOSIS — I2510 Atherosclerotic heart disease of native coronary artery without angina pectoris: Secondary | ICD-10-CM | POA: Diagnosis not present

## 2017-05-09 DIAGNOSIS — E1142 Type 2 diabetes mellitus with diabetic polyneuropathy: Secondary | ICD-10-CM | POA: Diagnosis not present

## 2017-05-09 LAB — CUP PACEART REMOTE DEVICE CHECK
Battery Remaining Longevity: 61 mo
Battery Voltage: 2.97 V
Brady Statistic AP VS Percent: 0.04 %
Brady Statistic AS VS Percent: 5.26 %
Date Time Interrogation Session: 20181004062603
HighPow Impedance: 65 Ohm
Implantable Lead Implant Date: 20160308
Implantable Lead Implant Date: 20160308
Implantable Lead Location: 753859
Implantable Lead Model: 4598
Implantable Lead Model: 6935
Implantable Pulse Generator Implant Date: 20160308
Lead Channel Impedance Value: 285 Ohm
Lead Channel Impedance Value: 380 Ohm
Lead Channel Impedance Value: 399 Ohm
Lead Channel Impedance Value: 456 Ohm
Lead Channel Impedance Value: 570 Ohm
Lead Channel Impedance Value: 646 Ohm
Lead Channel Impedance Value: 703 Ohm
Lead Channel Pacing Threshold Amplitude: 0.875 V
Lead Channel Pacing Threshold Pulse Width: 0.4 ms
Lead Channel Pacing Threshold Pulse Width: 0.6 ms
Lead Channel Sensing Intrinsic Amplitude: 3.125 mV
Lead Channel Sensing Intrinsic Amplitude: 3.125 mV
Lead Channel Sensing Intrinsic Amplitude: 8 mV
Lead Channel Sensing Intrinsic Amplitude: 8 mV
Lead Channel Setting Pacing Amplitude: 2 V
Lead Channel Setting Pacing Amplitude: 2 V
Lead Channel Setting Pacing Amplitude: 2.5 V
Lead Channel Setting Pacing Pulse Width: 0.6 ms
Lead Channel Setting Pacing Pulse Width: 0.8 ms
MDC IDC LEAD IMPLANT DT: 20160308
MDC IDC LEAD LOCATION: 753858
MDC IDC LEAD LOCATION: 753860
MDC IDC MSMT LEADCHNL LV IMPEDANCE VALUE: 380 Ohm
MDC IDC MSMT LEADCHNL LV IMPEDANCE VALUE: 399 Ohm
MDC IDC MSMT LEADCHNL LV IMPEDANCE VALUE: 437 Ohm
MDC IDC MSMT LEADCHNL LV IMPEDANCE VALUE: 570 Ohm
MDC IDC MSMT LEADCHNL LV IMPEDANCE VALUE: 703 Ohm
MDC IDC MSMT LEADCHNL RA IMPEDANCE VALUE: 437 Ohm
MDC IDC MSMT LEADCHNL RA PACING THRESHOLD AMPLITUDE: 1 V
MDC IDC MSMT LEADCHNL RV PACING THRESHOLD AMPLITUDE: 2.375 V
MDC IDC MSMT LEADCHNL RV PACING THRESHOLD PULSEWIDTH: 0.4 ms
MDC IDC SET LEADCHNL RV SENSING SENSITIVITY: 0.3 mV
MDC IDC STAT BRADY AP VP PERCENT: 0.21 %
MDC IDC STAT BRADY AS VP PERCENT: 94.49 %
MDC IDC STAT BRADY RA PERCENT PACED: 0.25 %
MDC IDC STAT BRADY RV PERCENT PACED: 0.31 %

## 2017-05-10 ENCOUNTER — Other Ambulatory Visit: Payer: Self-pay | Admitting: Neurology

## 2017-05-10 NOTE — Progress Notes (Signed)
I returned patient`s call. He c/o low back pain and sciatica. EMG on 05/07/17 showed axonal PN and possible L 5-S1 radiculopathy superimposed. Recommend he try gabapentin 300 mg TID and call back if no benefit. Call office on Monday and ckeck in

## 2017-05-12 ENCOUNTER — Other Ambulatory Visit: Payer: Self-pay

## 2017-05-12 ENCOUNTER — Telehealth: Payer: Self-pay | Admitting: Neurology

## 2017-05-12 ENCOUNTER — Ambulatory Visit (INDEPENDENT_AMBULATORY_CARE_PROVIDER_SITE_OTHER): Payer: Medicare Other

## 2017-05-12 ENCOUNTER — Telehealth: Payer: Self-pay | Admitting: Internal Medicine

## 2017-05-12 ENCOUNTER — Ambulatory Visit: Payer: Medicare Other

## 2017-05-12 DIAGNOSIS — J4540 Moderate persistent asthma, uncomplicated: Secondary | ICD-10-CM

## 2017-05-12 DIAGNOSIS — E1142 Type 2 diabetes mellitus with diabetic polyneuropathy: Secondary | ICD-10-CM | POA: Diagnosis not present

## 2017-05-12 DIAGNOSIS — I2510 Atherosclerotic heart disease of native coronary artery without angina pectoris: Secondary | ICD-10-CM | POA: Diagnosis not present

## 2017-05-12 DIAGNOSIS — I5043 Acute on chronic combined systolic (congestive) and diastolic (congestive) heart failure: Secondary | ICD-10-CM | POA: Diagnosis not present

## 2017-05-12 DIAGNOSIS — N184 Chronic kidney disease, stage 4 (severe): Secondary | ICD-10-CM | POA: Diagnosis not present

## 2017-05-12 DIAGNOSIS — E1122 Type 2 diabetes mellitus with diabetic chronic kidney disease: Secondary | ICD-10-CM | POA: Diagnosis not present

## 2017-05-12 DIAGNOSIS — I130 Hypertensive heart and chronic kidney disease with heart failure and stage 1 through stage 4 chronic kidney disease, or unspecified chronic kidney disease: Secondary | ICD-10-CM | POA: Diagnosis not present

## 2017-05-12 NOTE — Telephone Encounter (Signed)
Patient had NCV/EMG on 05-07-17 and since having this test he has had pinched nerve in his back.

## 2017-05-12 NOTE — Telephone Encounter (Signed)
I called patient. The patient has had some back pain and right leg pain following the EMG. The EMG study looked at the left leg.  It is possible that getting into the fetal position during the study they have worsen the pain.  The pain is better at this time, we will watch him for now, if it worsens we will give him a short course of prednisone.

## 2017-05-12 NOTE — Patient Outreach (Addendum)
[place] [facility]) Care Management  05/12/2017  KATSUMI WISLER 1934/04/16 [id number]    EMMI-HF RED ON EMMI ALERT Day # 3 Date:05/10/17 Red Alert Reason: "Any new problems? Yes   New/worsening problems? Yes  New/worsening SOB? Yes Other symptoms/ problems? Yes" Day# 4 Date: 05/08/17 Red Alert Reason: "New/worsening problems? Yes  New/worsening SOB? Yes"   Outreach attempt #1 to patient. Spoke with patient and he voiced that PT was in the home working with him and requested a call back later.         Plan: RN CM will make outreach attempt to patient within one business day.    Enzo Montgomery, RN,BSN,CCM [place] Management Telephonic Care Management Coordinator Direct Phone: [phone] Toll Free: [phone] Fax: [phone]

## 2017-05-12 NOTE — Telephone Encounter (Signed)
Needs verbals for home therapy for 2week6 and 1week2 To improve balance and safety at home

## 2017-05-13 ENCOUNTER — Encounter (HOSPITAL_COMMUNITY): Payer: Self-pay

## 2017-05-13 ENCOUNTER — Other Ambulatory Visit: Payer: Medicare Other

## 2017-05-13 ENCOUNTER — Ambulatory Visit (HOSPITAL_COMMUNITY)
Admission: RE | Admit: 2017-05-13 | Discharge: 2017-05-13 | Disposition: A | Discharge status: Home / Self Care | Payer: Medicare Other | Source: Ambulatory Visit | Attending: Internal Medicine | Admitting: Internal Medicine

## 2017-05-13 ENCOUNTER — Other Ambulatory Visit: Payer: Self-pay

## 2017-05-13 VITALS — BP 156/68 | HR 82 | Wt 216.6 lb

## 2017-05-13 DIAGNOSIS — N400 Enlarged prostate without lower urinary tract symptoms: Secondary | ICD-10-CM | POA: Diagnosis not present

## 2017-05-13 DIAGNOSIS — N184 Chronic kidney disease, stage 4 (severe): Secondary | ICD-10-CM | POA: Diagnosis not present

## 2017-05-13 DIAGNOSIS — N189 Chronic kidney disease, unspecified: Secondary | ICD-10-CM

## 2017-05-13 DIAGNOSIS — I5022 Chronic systolic (congestive) heart failure: Secondary | ICD-10-CM | POA: Diagnosis not present

## 2017-05-13 DIAGNOSIS — Z79899 Other long term (current) drug therapy: Secondary | ICD-10-CM | POA: Diagnosis not present

## 2017-05-13 DIAGNOSIS — I130 Hypertensive heart and chronic kidney disease with heart failure and stage 1 through stage 4 chronic kidney disease, or unspecified chronic kidney disease: Secondary | ICD-10-CM | POA: Diagnosis not present

## 2017-05-13 DIAGNOSIS — Z95810 Presence of automatic (implantable) cardiac defibrillator: Secondary | ICD-10-CM | POA: Insufficient documentation

## 2017-05-13 DIAGNOSIS — Z7982 Long term (current) use of aspirin: Secondary | ICD-10-CM | POA: Insufficient documentation

## 2017-05-13 DIAGNOSIS — E1122 Type 2 diabetes mellitus with diabetic chronic kidney disease: Secondary | ICD-10-CM | POA: Diagnosis not present

## 2017-05-13 DIAGNOSIS — I5042 Chronic combined systolic (congestive) and diastolic (congestive) heart failure: Secondary | ICD-10-CM

## 2017-05-13 DIAGNOSIS — E785 Hyperlipidemia, unspecified: Secondary | ICD-10-CM | POA: Diagnosis not present

## 2017-05-13 DIAGNOSIS — I255 Ischemic cardiomyopathy: Secondary | ICD-10-CM | POA: Insufficient documentation

## 2017-05-13 DIAGNOSIS — I10 Essential (primary) hypertension: Secondary | ICD-10-CM | POA: Diagnosis not present

## 2017-05-13 DIAGNOSIS — E1140 Type 2 diabetes mellitus with diabetic neuropathy, unspecified: Secondary | ICD-10-CM | POA: Insufficient documentation

## 2017-05-13 DIAGNOSIS — Z87891 Personal history of nicotine dependence: Secondary | ICD-10-CM | POA: Insufficient documentation

## 2017-05-13 DIAGNOSIS — E291 Testicular hypofunction: Secondary | ICD-10-CM | POA: Diagnosis not present

## 2017-05-13 DIAGNOSIS — I2510 Atherosclerotic heart disease of native coronary artery without angina pectoris: Secondary | ICD-10-CM | POA: Insufficient documentation

## 2017-05-13 DIAGNOSIS — Z96651 Presence of right artificial knee joint: Secondary | ICD-10-CM | POA: Insufficient documentation

## 2017-05-13 LAB — BASIC METABOLIC PANEL
ANION GAP: 11 (ref 5–15)
BUN: 56 mg/dL — ABNORMAL HIGH (ref 6–20)
CALCIUM: 8.8 mg/dL — AB (ref 8.9–10.3)
CO2: 29 mmol/L (ref 22–32)
Chloride: 96 mmol/L — ABNORMAL LOW (ref 101–111)
Creatinine, Ser: 2.43 mg/dL — ABNORMAL HIGH (ref 0.61–1.24)
GFR, EST AFRICAN AMERICAN: 27 mL/min — AB (ref >60)
GFR, EST NON AFRICAN AMERICAN: 23 mL/min — AB (ref >60)
GLUCOSE: 200 mg/dL — AB (ref 65–99)
Potassium: 3.8 mmol/L (ref 3.5–5.1)
Sodium: 136 mmol/L (ref 135–145)

## 2017-05-13 MED ORDER — TORSEMIDE 20 MG PO TABS: 100.0000 mg | ORAL_TABLET | Freq: Every day | ORAL | 6 refills | Status: DC

## 2017-05-13 MED ORDER — OMALIZUMAB 150 MG ~~LOC~~ SOLR
300.0000 mg | SUBCUTANEOUS | Status: DC
  Administered 2017-05-12: 300 mg via SUBCUTANEOUS

## 2017-05-13 NOTE — Patient Instructions (Signed)
Routine lab work today. Will notify you of abnormal results, otherwise no news is good news!  Take 40 mg (2 tabs) of torsemide once this afternoon. Then increase daily dose to 100 mg (5 tabs) once daily.  Follow up 1 month.  Take all medication as prescribed the day of your appointment. Bring all medications with you to your appointment.  Do the following things EVERYDAY: 1) Weigh yourself in the morning before breakfast. Write it down and keep it in a log. 2) Take your medicines as prescribed 3) Eat low salt foods-Limit salt (sodium) to 2000 mg per day.  4) Stay as active as you can everyday 5) Limit all fluids for the day to less than 2 liters

## 2017-05-13 NOTE — Progress Notes (Signed)
Xolair injection documentation and charges entered by Ashley Caulfield, RMA, based on injection sheet filled out by Tammy Scott per office protocol.   

## 2017-05-13 NOTE — Progress Notes (Signed)
Patient ID: Shane [name], male   DOB: 04-22-1934, 83 y.o.   MRN: [id number]    Advanced Heart Failure Clinic Note   Date:  05/13/2017   ID:  Shane [name], DOB [birth year], MRN [id number]  PCP:  [name], MD  Cardiologist:   Nahser  Nephrologist: Hollie Salk  History of Present Illness: Shane [name] is a 83 y.o. male with CAD s/p CABG, systolic HF EF 70-62% and CKD 4 referred to the HF Clinic by Dr. Acie Fredrickson.   He is retired from [employer]. Had been doing fine until March or April 2018 when he started having more DOE. Felt he couldn't breath as deeply. MDT ICD was optimized. Took him off Toprol and switched to carvedilol. Also started Entresto. Began to gain fluid. Then lasix was started. (this was new). Entresto stopped. Creatine went from 1.9 to 3.3 then back to to 2.7.   Admitted 05/01/17-05/06/17 with cardiorenal syndrome. Discussed with Dr. Hollie Salk (His nephrologist) who agreed on admission for IV lasix trial. (He does have a patent fistula in place for initiation of HD once required). Pt initially had poor response to IV lasix and Renal consulted with plans for HD initiation. However on day 2, he began to respond to high-dose lasix with increased urine output and decreasing BUN/Cr. BP active meds cut back to allow more room for renal perfusion. Overall he diuresed 9.6 L and down 12 lbs on lasix 120 mg IV BID. Pt examined am of 05/06/17. Discharge weight was 206 pounds.   He returns today for HF follow up. Weight is up 5 pounds at home. Up 10 pounds from discharge.  Feeling fatigued, SOB with walking into clinic, + orthopnea. He denies chest pain, palpitations. Taking torsemide 60 mg daily at home (dc instructions say to take 80 mg daily). He took metolazone yesterday, did not have increased output.     Past Medical History:  Diagnosis Date  . Abnormal nuclear cardiac imaging test Nov 2010   moderate area of infarct in the inferior wall with only minimal reversibility and EF of  28%  . AICD (automatic cardioverter/defibrillator) present   . Allergic rhinitis 01-08-13   Uses nebulizer for chronic sinus issues and Mucinex.  . Arthritis    osteoarthritis   . Asthma    Extrinic  . Blood transfusion    ? at time of bypass surgery   . BPH (benign prostatic hyperplasia)   . Cellulitis of arm, left    MSSA  . CHF (congestive heart failure) ([place])   . CKD (chronic kidney disease), stage III ([place])    "was told due to meds he takes"-no Renal workup done  . Colon polyps    adenomatous  . Coronary artery disease    remote CABG in 1985, cath in 2003 by Dr. Lia Foyer with no PCI, last nuclear in 2010 showing scar and EF of 28%.   . Diabetes mellitus   . Diabetic neuropathy ([place]) 04/25/2017  . Dyslipidemia   . Dyspnea   . Gait abnormality 04/25/2017  . Glaucoma 01-08-13   tx. eye drops  . HOH (hard of hearing)   . HTN (hypertension)   . Hypercholesterolemia   . Left ventricular dysfunction    28% per nuclear in 2010 and 35 to 40% per echo in 2010  . Myocardial infarction ([place])    1985  . Neuromuscular disorder ([place])    legs mild paralysis-able to walk"nerve damage"-legs- left leg brace   . Neuropathy   .  Pneumonia    hx of several times years ago   . Spinal stenosis     Past Surgical History:  Procedure Laterality Date  . A/V FISTULAGRAM Right 10/17/2016   Procedure: A/V Fistulagram;  Surgeon: Conrad Jane Lew, MD;  Location: [facility];  Service: Cardiovascular;  Laterality: Right;  . BACK SURGERY     hx of back surgery x 4   . BASCILIC VEIN TRANSPOSITION Right 09/09/2016   Procedure: BASCILIC VEIN TRANSPOSITION Right Arm;  Surgeon: Rosetta Posner, MD;  Location: [facility] OR;  Service: Vascular;  Laterality: Right;  . BI-VENTRICULAR IMPLANTABLE CARDIOVERTER DEFIBRILLATOR N/A 10/10/2014   Procedure: BI-VENTRICULAR IMPLANTABLE CARDIOVERTER DEFIBRILLATOR  (CRT-D);  Surgeon: [name], MD;  Location: [facility] CATH LAB;  Service: Cardiovascular;  Laterality: N/A;  . CARDIAC  CATHETERIZATION  2003  . Cataract      recent cataract surgery 6'14  . COLONOSCOPY N/A 01/25/2013   Procedure: COLONOSCOPY;  Surgeon: Irene Shipper, MD;  Location: [facility] ENDOSCOPY;  Service: Endoscopy;  Laterality: N/A;  . CORONARY ARTERY BYPASS GRAFT  1985   x 5 vessels  . LUMBAR DISC SURGERY    . PERIPHERAL VASCULAR BALLOON ANGIOPLASTY Right 10/17/2016   Procedure: Peripheral Vascular Balloon Angioplasty;  Surgeon: Conrad Rockingham, MD;  Location: [place] CV LAB;  Service: Cardiovascular;  Laterality: Right;  ARM VEINOUS AND CENTRAL VEIN  . PR POLYSOM 6/>YRS SLEEP 4/> ADDL PARAM ATTND  12/07/2015  . PROSTATE SURGERY    . TOTAL KNEE ARTHROPLASTY  08/01/2011   Procedure: TOTAL KNEE ARTHROPLASTY;  Surgeon: Johnn Hai;  Location: [facility] ORS;  Service: Orthopedics;  Laterality: Right;    Current Outpatient Prescriptions  Medication Sig Dispense Refill  . acetaminophen (TYLENOL) 500 MG tablet Take 1,000 mg by mouth every 8 (eight) hours as needed (pain).     Marland Kitchen albuterol (PROVENTIL HFA;VENTOLIN HFA) 108 (90 Base) MCG/ACT inhaler Inhale 2 puffs into the lungs every 4 (four) hours as needed for wheezing or shortness of breath (cough, shortness of breath or wheezing.). (Patient taking differently: Inhale 2 puffs into the lungs every 4 (four) hours as needed for wheezing or shortness of breath (cough). ) 1 Inhaler 1  . amLODipine (NORVASC) 5 MG tablet Take 1 tablet (5 mg total) by mouth daily. 30 tablet 6  . aspirin EC 81 MG tablet Take 81 mg by mouth daily.     Marland Kitchen atorvastatin (LIPITOR) 10 MG tablet Take 1 tablet (10 mg total) by mouth daily. 90 tablet 3  . carvedilol (COREG) 6.25 MG tablet Take 1 tablet (6.25 mg total) by mouth 2 (two) times daily with a meal. 60 tablet 5  . cetirizine (ZYRTEC) 10 MG tablet Take 10 mg by mouth at bedtime as needed (seasonal allergies).     . fluocinonide cream (LIDEX) 7.25 % Apply 1 application topically 2 (two) times daily as needed (for itchy rash).     . gabapentin  (NEURONTIN) 300 MG capsule Take 300 mg by mouth at bedtime.    . hydrALAZINE (APRESOLINE) 25 MG tablet Take 1 tablet (25 mg total) by mouth 3 (three) times daily. 90 tablet 5  . metolazone (ZAROXOLYN) 5 MG tablet Take 1 pill as directed by HF clinic if weight goes up 3 lbs overnight, or 5 lbs within one week. Call before taking. 15 tablet 3  . omalizumab (XOLAIR) 150 MG injection Inject 300 mg into the skin every 14 (fourteen) days. Last injection given 04/25/17    . potassium  chloride (MICRO-K) 10 MEQ CR capsule Take 10 mEq by mouth daily as needed (with each metolazone dose).     . tadalafil (CIALIS) 20 MG tablet Take 20 mg by mouth daily as needed for erectile dysfunction. Do not exceed 2 doses in 7 days    . testosterone cypionate (DEPOTESTOTERONE CYPIONATE) 100 MG/ML injection Inject 400 mg into the muscle every 21 ( twenty-one) days. For IM use only    . torsemide (DEMADEX) 20 MG tablet Take 60 mg by mouth daily.    Marland Kitchen triamcinolone (NASACORT AQ) 55 MCG/ACT AERO nasal inhaler Place 2 sprays into the nose daily as needed (seasonal allergies).     . zolpidem (AMBIEN) 10 MG tablet TAKE 1/2 TO 1 (ONE-HALF TO ONE) TABLET BY MOUTH AT BEDTIME AS NEEDED FOR SLEEP (Patient taking differently: TAKE 1/2 TABLET (5 MG) BY MOUTH DAILY AT BEDTIME) 30 tablet 1   Current Facility-Administered Medications  Medication Dose Route Frequency Provider Last Rate Last Dose  . omalizumab Arvid Right) injection 300 mg  300 mg Subcutaneous Q14 Days Javier Glazier, MD   300 mg at 04/25/17 1213  . omalizumab Arvid Right) injection 300 mg  300 mg Subcutaneous Q14 Days Javier Glazier, MD   300 mg at 05/12/17 0867   Allergies:   Codeine; Hydrocodone; Pseudoephedrine; and Azithromycin   Social History:  The patient  reports that he quit smoking about 58 years ago. His smoking use included Cigarettes, Pipe, and Cigars. He has a 5.00 pack-year smoking history. He has never used smokeless tobacco. He reports that he does not drink  alcohol or use drugs.   Family History:  The patient's family history includes Bone cancer in his brother; Colon polyps in his father; Diabetes in his sister; Heart attack in his father; Heart disease in his brother and father; Lung cancer in his mother.   Review of systems complete and found to be negative unless listed in HPI.    PHYSICAL EXAM: Vitals:   05/13/17 1048  BP: (!) 156/68  Pulse: 82  SpO2: 94%  Weight: 216 lb 9.6 oz (98.2 kg)   Wt Readings from Last 3 Encounters:  05/13/17 216 lb 9.6 oz (98.2 kg)  05/06/17 206 lb (93.4 kg)  05/01/17 223 lb 6 oz (101.3 kg)      General:Elderly, fatigued appearing. No resp difficulty. HEENT: Normal Neck: Supple. JVP 10 cm. Carotids 2+ bilat; no bruits. No thyromegaly or nodule noted. Cor: PMI nondisplaced. RRR. 2/6 TR murmur.  Lungs: CTAB, normal effort. Abdomen: Obese, soft, non-tender, + distended, no HSM. No bruits or masses. +BS  Extremities: No cyanosis, clubbing, rash. 2+ pedal edema. RUE AVF.  Neuro: Alert & orientedx3, cranial nerves grossly intact. moves all 4 extremities w/o difficulty. Affect pleasant   ASSESSMENT AND PLAN: 1. Chronic systolic congestive heart failure: ICM. Echo EF 40-45% in 11/2015.  - NYHA III - Volume elevated on exam. Take an extra 40 mg torsemide today. Optivol with increasing fluid, but still below threshold.  - Increase torsemide to 100 mg daily. Discussed with Dr. Haroldine Laws.  - Continue Coreg 25 mg BID - Advised him to not take metolazone.  - He will see Dr. Hollie Salk in 2 days.  - BMET today and will send to Dr. Bishop Dublin office.    2. Coronary artery disease: nhistory of remote coronary artery bypass grafting 1985 - Denies chest pain. - Last cath 2003, LM and RCA occluded. LIMA to LAD patent. RIMA to OM patent. SVG to PDA occluded.  3. CKD 4- creatinine baseline 2.5-2.8 - As above, will see Dr. Hollie Salk this week.    4. Hyperlipidemia - Continue atorvastatin.   5. Diabetes mellitus - Per  PCP.   6. HTN - Continue current regimen, allowing for higher BP for renal perfusion.     Arbutus Leas, NP  11:08 AM  Patient seen and examined with Jettie Booze, NP. ICD interrogated personally in clinic. We discussed all aspects of the encounter. I agree with the assessment and plan as stated above.   He diuresed well in the hospital with IV lasix - barely avoiding HD initiation with BUN > 100 and initial sluggish response to IV lasix. Now beginning to re-accumulate. Will increase torsemide to 100mg  daily. He has f/u with Dr. Hollie Salk later this week. If weight increasing will need to consider starting HD. Will allow BP to remain moderately elevated to maximize renal perfusion.  Glori Bickers, MD  10:04 PM

## 2017-05-13 NOTE — Patient Outreach (Signed)
[facility]ane [name]) Care Management  05/13/2017  Shane [name] 04-08-1934 [id number]   EMMI-HF RED ON EMMI ALERT Day # 3 Date:05/10/17 Red Alert Reason: "Any new problems? Yes   New/worsening problems? Yes  New/worsening SOB? Yes Other symptoms/ problems? Yes" Day# 4 Date: 05/08/17 Red Alert Reason: "New/worsening problems? Yes  New/worsening SOB? Yes"   Outreach attempt #2 to patient. No answer at present.      Plan: RN CM will send unsuccessful outreach letter to patient and close case if no response within 10 business days.   Enzo Montgomery, RN,BSN,CCM [place] Management Telephonic Care Management Coordinator Direct Phone: [phone] Toll Free: [phone] Fax: [phone]

## 2017-05-13 NOTE — Telephone Encounter (Signed)
Spoke with Sharyn Lull to give verbal orders per MD

## 2017-05-14 ENCOUNTER — Other Ambulatory Visit: Payer: Self-pay

## 2017-05-14 DIAGNOSIS — N184 Chronic kidney disease, stage 4 (severe): Secondary | ICD-10-CM | POA: Diagnosis not present

## 2017-05-14 DIAGNOSIS — I2510 Atherosclerotic heart disease of native coronary artery without angina pectoris: Secondary | ICD-10-CM | POA: Diagnosis not present

## 2017-05-14 DIAGNOSIS — I130 Hypertensive heart and chronic kidney disease with heart failure and stage 1 through stage 4 chronic kidney disease, or unspecified chronic kidney disease: Secondary | ICD-10-CM | POA: Diagnosis not present

## 2017-05-14 DIAGNOSIS — E1142 Type 2 diabetes mellitus with diabetic polyneuropathy: Secondary | ICD-10-CM | POA: Diagnosis not present

## 2017-05-14 DIAGNOSIS — E1122 Type 2 diabetes mellitus with diabetic chronic kidney disease: Secondary | ICD-10-CM | POA: Diagnosis not present

## 2017-05-14 DIAGNOSIS — I5043 Acute on chronic combined systolic (congestive) and diastolic (congestive) heart failure: Secondary | ICD-10-CM | POA: Diagnosis not present

## 2017-05-14 NOTE — Patient Outreach (Signed)
[place] [facility]) Care Management  05/14/2017  Shane [name] February 28, 1934 [id number]     EMMI-CHF RED ON EMMI ALERT Day # 6 Date: 05/13/17 Red Alert Reason: "Weight: 211 lbs" Day # 3 Date:05/10/17 Red Alert Reason: "Any new problems? Yes New/worsening problems? Yes New/worsening SOB? Yes Other symptoms/ problems? Yes" Day# 4 Date: 05/08/17 Red Alert Reason: "New/worsening problems? Yes New/worsening SOB? Yes"  Outreach attempt to patient. Spoke with patient. Reviewed and addressed red alerts with patient. H voices that his weight had gone up and he was symptomatic. He went yesterday to MD f/u appt. MD increased his diuretic and his weight is back don to 208 lbs. He denies any SOB and/or swelling at this time. Patient able to verbalize importance of monitoring weight and when to call MD for weight gain. He voices that he is adhering to low salt diet and limiting fluid intake to 2 liters/day as advised by MD. Patient confirmed that [facility] has been out to see him and he is getting HHPT. He voices he has all his meds and understands how,when and why to take them. He has supportive family to assist him as needed.Marland Kitchen He denies any further RN CM needs or concerns at this time. Advised patient that they would continue to get automated EMMI- HF post discharge calls to assess how they are doing following recent hospitalization and will receive a call from a nurse if any of their responses were abnormal. Patient voiced understanding and was appreciative of f/u call.     Plan: RN CM will notify [facility] administrative assistant of case status.    Enzo Montgomery, RN,BSN,CCM [place] Management Telephonic Care Management Coordinator Direct Phone: [phone] Toll Free: [phone] Fax: [phone]

## 2017-05-15 ENCOUNTER — Encounter: Payer: Self-pay | Admitting: Cardiology

## 2017-05-15 DIAGNOSIS — I770 Arteriovenous fistula, acquired: Secondary | ICD-10-CM | POA: Diagnosis not present

## 2017-05-15 DIAGNOSIS — E1142 Type 2 diabetes mellitus with diabetic polyneuropathy: Secondary | ICD-10-CM | POA: Diagnosis not present

## 2017-05-15 DIAGNOSIS — E1122 Type 2 diabetes mellitus with diabetic chronic kidney disease: Secondary | ICD-10-CM | POA: Diagnosis not present

## 2017-05-15 DIAGNOSIS — I5022 Chronic systolic (congestive) heart failure: Secondary | ICD-10-CM | POA: Diagnosis not present

## 2017-05-15 DIAGNOSIS — I129 Hypertensive chronic kidney disease with stage 1 through stage 4 chronic kidney disease, or unspecified chronic kidney disease: Secondary | ICD-10-CM | POA: Diagnosis not present

## 2017-05-15 DIAGNOSIS — N184 Chronic kidney disease, stage 4 (severe): Secondary | ICD-10-CM | POA: Diagnosis not present

## 2017-05-15 DIAGNOSIS — I130 Hypertensive heart and chronic kidney disease with heart failure and stage 1 through stage 4 chronic kidney disease, or unspecified chronic kidney disease: Secondary | ICD-10-CM | POA: Diagnosis not present

## 2017-05-15 DIAGNOSIS — I5043 Acute on chronic combined systolic (congestive) and diastolic (congestive) heart failure: Secondary | ICD-10-CM | POA: Diagnosis not present

## 2017-05-15 DIAGNOSIS — I2510 Atherosclerotic heart disease of native coronary artery without angina pectoris: Secondary | ICD-10-CM | POA: Diagnosis not present

## 2017-05-15 DIAGNOSIS — E1129 Type 2 diabetes mellitus with other diabetic kidney complication: Secondary | ICD-10-CM | POA: Diagnosis not present

## 2017-05-16 ENCOUNTER — Other Ambulatory Visit: Payer: Self-pay

## 2017-05-16 DIAGNOSIS — E1142 Type 2 diabetes mellitus with diabetic polyneuropathy: Secondary | ICD-10-CM | POA: Diagnosis not present

## 2017-05-16 DIAGNOSIS — I2510 Atherosclerotic heart disease of native coronary artery without angina pectoris: Secondary | ICD-10-CM | POA: Diagnosis not present

## 2017-05-16 DIAGNOSIS — E1122 Type 2 diabetes mellitus with diabetic chronic kidney disease: Secondary | ICD-10-CM | POA: Diagnosis not present

## 2017-05-16 DIAGNOSIS — I130 Hypertensive heart and chronic kidney disease with heart failure and stage 1 through stage 4 chronic kidney disease, or unspecified chronic kidney disease: Secondary | ICD-10-CM | POA: Diagnosis not present

## 2017-05-16 DIAGNOSIS — I5043 Acute on chronic combined systolic (congestive) and diastolic (congestive) heart failure: Secondary | ICD-10-CM | POA: Diagnosis not present

## 2017-05-16 DIAGNOSIS — N184 Chronic kidney disease, stage 4 (severe): Secondary | ICD-10-CM | POA: Diagnosis not present

## 2017-05-16 NOTE — Patient Outreach (Signed)
[place] [facility]) Care Management  05/16/2017  Shane [name] 01-Sep-1933 [id number]   EMMI-CHF RED ON EMMI ALERT Day # 8 Date: 05/13/17 Red Alert Reason: [name] to follow up appointment-no  Spoke with patient he reports he is doing well and that he answered the question no as to he has not needed to see his primary doctor as his cardiologist and kidney doctor are handling his heart failure and he has seen both doctors.  Patient denies any problems and is doing well.    Advised patient that they would continue to get automated EMMI- HF post discharge calls to assess how they are doing following recent hospitalization and will receive a call from a nurse if any of their responses were abnormal. Patient voiced understanding and was appreciative of f/u call.   Plan: RN CM will close the case as patient assessed and no needs identified.   RN CM will notify care management assistant of case status.    Jone Baseman, RN, MSN [facility] Care Management Care Management Telephonic Coordinator Direct Line [phone] Toll Free: [phone]  Fax: [phone]

## 2017-05-19 ENCOUNTER — Other Ambulatory Visit: Payer: Self-pay

## 2017-05-19 DIAGNOSIS — I130 Hypertensive heart and chronic kidney disease with heart failure and stage 1 through stage 4 chronic kidney disease, or unspecified chronic kidney disease: Secondary | ICD-10-CM | POA: Diagnosis not present

## 2017-05-19 DIAGNOSIS — E1142 Type 2 diabetes mellitus with diabetic polyneuropathy: Secondary | ICD-10-CM | POA: Diagnosis not present

## 2017-05-19 DIAGNOSIS — E1122 Type 2 diabetes mellitus with diabetic chronic kidney disease: Secondary | ICD-10-CM | POA: Diagnosis not present

## 2017-05-19 DIAGNOSIS — I5043 Acute on chronic combined systolic (congestive) and diastolic (congestive) heart failure: Secondary | ICD-10-CM | POA: Diagnosis not present

## 2017-05-19 DIAGNOSIS — N184 Chronic kidney disease, stage 4 (severe): Secondary | ICD-10-CM | POA: Diagnosis not present

## 2017-05-19 DIAGNOSIS — I2510 Atherosclerotic heart disease of native coronary artery without angina pectoris: Secondary | ICD-10-CM | POA: Diagnosis not present

## 2017-05-19 NOTE — Patient Outreach (Signed)
[place] [facility]) Care Management  05/19/2017  MORRILL BOMKAMP 06-28-1934 [id number]   EMMI-CHF RED ON EMMI ALERT Day # 10 Date: 05/17/17 Red Alert Reason: Any new problems-yes New/worsening problems?-Yes  Spoke with patient he is able to verify HIPAA.  Patient reports he feels good and denies any new problems or worsening symptoms. Patient reports that his weight today was 206.5 lbs.  Patient reports watching his salt intake.   Advised patient that they would continue to get automated EMMI- HF post discharge calls to assess how they are doing following recent hospitalization and will receive a call from a nurse if any of their responses were abnormal. Patient voiced understanding and was appreciative of f/u call.   Plan: RN CM will close case at this time as patient has been assessed and no needs identified.   RN CM notify care management assistant of case closure.  Jone Baseman, RN, MSN [facility] Care Management Care Management Coordinator Direct Line [phone] Toll Free: [phone]  Fax: [phone]

## 2017-05-20 ENCOUNTER — Telehealth: Payer: Self-pay | Admitting: Internal Medicine

## 2017-05-20 ENCOUNTER — Other Ambulatory Visit (HOSPITAL_COMMUNITY): Payer: Self-pay | Admitting: Internal Medicine

## 2017-05-20 DIAGNOSIS — I5043 Acute on chronic combined systolic (congestive) and diastolic (congestive) heart failure: Secondary | ICD-10-CM | POA: Diagnosis not present

## 2017-05-20 DIAGNOSIS — N184 Chronic kidney disease, stage 4 (severe): Secondary | ICD-10-CM | POA: Diagnosis not present

## 2017-05-20 DIAGNOSIS — I2510 Atherosclerotic heart disease of native coronary artery without angina pectoris: Secondary | ICD-10-CM | POA: Diagnosis not present

## 2017-05-20 DIAGNOSIS — E1142 Type 2 diabetes mellitus with diabetic polyneuropathy: Secondary | ICD-10-CM | POA: Diagnosis not present

## 2017-05-20 DIAGNOSIS — E1122 Type 2 diabetes mellitus with diabetic chronic kidney disease: Secondary | ICD-10-CM | POA: Diagnosis not present

## 2017-05-20 DIAGNOSIS — I130 Hypertensive heart and chronic kidney disease with heart failure and stage 1 through stage 4 chronic kidney disease, or unspecified chronic kidney disease: Secondary | ICD-10-CM | POA: Diagnosis not present

## 2017-05-20 NOTE — Telephone Encounter (Signed)
Yes I can be attending

## 2017-05-20 NOTE — Telephone Encounter (Signed)
Pls advise on msg below.../lmb 

## 2017-05-20 NOTE — Telephone Encounter (Signed)
Would like to know it [name] will be the attending while the patient receives palliative care  Please advise

## 2017-05-21 ENCOUNTER — Other Ambulatory Visit: Payer: Self-pay

## 2017-05-21 NOTE — Patient Outreach (Signed)
Patient triggered RED on EMMI Heart Failure dashboard, notification sent to Davina Green, RN 

## 2017-05-21 NOTE — Telephone Encounter (Signed)
Notified Fram w/MD response...Shane [name]

## 2017-05-21 NOTE — Patient Outreach (Signed)
[facility] [facility]) Care Management  05/21/2017  Shane [name] 21-Apr-1934 [id number]   EMMI-CHF RED ON EMMI ALERT Day # 13 Date: 05/20/17 Red Alert Reason: Any new problems-yes New/worsening problems?-Yes Other symptoms/problems? Yes  Spoke with patient he is able to verify HIPAA.  Patient reports that his weight was up  To 209 lbs on yesterday but today it is down to 207.8 lbs.  Patient reports that the automated calls are a waste of his time. He feels he is doing well and he has a nurse from home health twice a week and therapy three times a week. Patient knows signs to watch for and report.  Advised patient that calls are voluntary.  Patient wishes to have calls discontinued.  Advised patient I would notify appropriate person to end calls.  He verbalized understanding.   Plan: RN CM will notify care management assistant Alycia Rossetti to end [facility] calls.   RN CM will close case at this time.    Jone Baseman, RN, MSN [facility] Care Management Care Management Coordinator Direct Line [phone] Toll Free: [phone]  Fax: [phone]

## 2017-05-23 ENCOUNTER — Telehealth (HOSPITAL_COMMUNITY): Payer: Self-pay | Admitting: Cardiology

## 2017-05-23 DIAGNOSIS — E1122 Type 2 diabetes mellitus with diabetic chronic kidney disease: Secondary | ICD-10-CM | POA: Diagnosis not present

## 2017-05-23 DIAGNOSIS — I5043 Acute on chronic combined systolic (congestive) and diastolic (congestive) heart failure: Secondary | ICD-10-CM | POA: Diagnosis not present

## 2017-05-23 DIAGNOSIS — I130 Hypertensive heart and chronic kidney disease with heart failure and stage 1 through stage 4 chronic kidney disease, or unspecified chronic kidney disease: Secondary | ICD-10-CM | POA: Diagnosis not present

## 2017-05-23 DIAGNOSIS — N184 Chronic kidney disease, stage 4 (severe): Secondary | ICD-10-CM | POA: Diagnosis not present

## 2017-05-23 DIAGNOSIS — I2510 Atherosclerotic heart disease of native coronary artery without angina pectoris: Secondary | ICD-10-CM | POA: Diagnosis not present

## 2017-05-23 DIAGNOSIS — E1142 Type 2 diabetes mellitus with diabetic polyneuropathy: Secondary | ICD-10-CM | POA: Diagnosis not present

## 2017-05-23 NOTE — Telephone Encounter (Signed)
Ok to take an additional torsemide

## 2017-05-23 NOTE — Telephone Encounter (Signed)
    Advanced Heart Failure Triage Encounter  Patient Name: Shane [name]   Date of Call: @TODAY @   Problem:  Patient called to report his weight has increased in the past week, weight was 206 now 210  Denies: increased SOB, chest pain  Patient request to take additional 40 mg of torsemide today, reports this helped his additional weight after last appointment.  Reports he was seen by Dr Hollie Salk (nephrology)- reports labs were stable  Will forward to provider to ok additional torsemide usage, as he was advised to not take metolazone   Plan:    Kerry Dory, CMA

## 2017-05-23 NOTE — Telephone Encounter (Signed)
This encounter was created in error - please disregard.

## 2017-05-23 NOTE — Telephone Encounter (Signed)
Detailed message left for patient with instructions to take additional 40 mg of Torsemide today, will attempt to contact patient again before of end to confirm he received instructions.

## 2017-05-26 ENCOUNTER — Other Ambulatory Visit: Payer: Medicare Other

## 2017-05-26 ENCOUNTER — Ambulatory Visit: Payer: Medicare Other

## 2017-05-26 ENCOUNTER — Ambulatory Visit (INDEPENDENT_AMBULATORY_CARE_PROVIDER_SITE_OTHER): Payer: Self-pay

## 2017-05-26 DIAGNOSIS — M5431 Sciatica, right side: Secondary | ICD-10-CM | POA: Diagnosis not present

## 2017-05-26 DIAGNOSIS — Z95810 Presence of automatic (implantable) cardiac defibrillator: Secondary | ICD-10-CM

## 2017-05-26 DIAGNOSIS — M9903 Segmental and somatic dysfunction of lumbar region: Secondary | ICD-10-CM | POA: Diagnosis not present

## 2017-05-26 DIAGNOSIS — I5042 Chronic combined systolic (congestive) and diastolic (congestive) heart failure: Secondary | ICD-10-CM

## 2017-05-26 NOTE — Progress Notes (Signed)
EPIC Encounter for ICM Monitoring  Patient Name: Shane [name] is a 83 y.o. male Date: 05/26/2017 Primary Care Physican: [name], MD Primary Cardiologist:Nahser/Bensimhon Electrophysiologist: Caryl Comes Nephrologist: Hollie Salk at [facility] Weight: 211.8lbs Bi-V Pacing: 89.8% (dropped from 92.3%on 03/31/2017)   Event Summary: V. Pacing < 90%  ?11 V. Sensing Episodes  ?1 VT-NS  ?5 seconds in AT/AF Since Last Session      Heart Failure questions reviewed, pt reported today that weight continues to increase from 206 lbs on 10/19 and today's weight is 211.8 lbs  He reported contacting HF clinic this morning to advised them weight continues to increase.      Thoracic impedance abnormal suggesting fluid accumulation since 05/18/2017 and showing it is starting to trend toward baseline but patient remains symptomatic with weight gain.  Prescribed dosage: Torsemide 20 mg 5tablets (100 mg total)daily. Metolazone 5 mg take 1 pill as directed by HF clinic if weight goes up 3 lbs overnight, or 5 lbs within one week. Call before taking.  Labs: 05/13/2017 Creatinine 2.43, BUN 56,   Potassium 3.8, Sodium 136, EGFR 23-27 05/06/2017 Creatinine 2.83, BUN 90,   Potassium 3.7, Sodium 140, EGFR 19-23 05/05/2017 Creatinine 2.82, BUN 99,   Potassium 3.5, Sodium 137, EGFR 20-23 05/04/2017 Creatinine 3.00, BUN 104, Potassium 3.3, Sodium 137, EGFR 18-21  05/03/2017 Creatinine 3.17, BUN 109, Potassium 3.1, Sodium 136, EGFR 17-20  05/02/2017 Creatinine 3.32, BUN 108, Potassium 3.8, Sodium 134, EGFR 16-19  05/01/2017 Creatinine 3.15, BUN 103, Potassium 3.2, Sodium 131, EGFR 17-21  04/28/2017 Creatinine 2.96, BUN 90, Potassium 3.3, Sodium 138, EGFR 19-22  03/04/2017 Creatinine 2.39, BUN 67, Potassium 3.4, Sodium 137  01/08/2017 Creatinine 2.98, BUN 82, Potassium 3.0, Sodium 136, EGFR 18-21 12/10/2016 Creatinine 2.66, BUN 70, Potassium 3.5, Sodium 138, EGFR 21-24 12/03/2016 Creatinine 2.70, BUN  72, Potassium 3.5, Sodium 138, EGFR 21-24 11/04/2016 Creatinine 3.27, BUN 79, Potassium 3.9, Sodium 137, EGFR 16-19 10/18/2016 Creatinine 2.7, BUN 63. Potassium 3.6, Sodium 138 10/02/2016 Creatinine 2.43, BUN 66, Potassium 3.6, Sodium 139, EGFR 24-28  Recommendations:   Advised will send to Dr Haroldine Laws for review and recommendations.  Follow-up plan: ICM clinic phone appointment on 05/29/2017 to recheck fluid levels.  Office appointment scheduled 06/12/2017 with Dr. Haroldine Laws  Copy of ICM check sent to Dr. Caryl Comes.   3 month ICM trend: 05/26/2017   1 Year ICM trend:      Rosalene Billings, RN 05/26/2017 11:24 AM

## 2017-05-27 ENCOUNTER — Telehealth (HOSPITAL_COMMUNITY): Payer: Self-pay | Admitting: *Deleted

## 2017-05-27 ENCOUNTER — Telehealth: Payer: Self-pay | Admitting: Internal Medicine

## 2017-05-27 ENCOUNTER — Other Ambulatory Visit: Payer: Self-pay | Admitting: Cardiology

## 2017-05-27 ENCOUNTER — Ambulatory Visit: Payer: Medicare Other | Admitting: Cardiovascular Disease

## 2017-05-27 DIAGNOSIS — N184 Chronic kidney disease, stage 4 (severe): Secondary | ICD-10-CM | POA: Diagnosis not present

## 2017-05-27 DIAGNOSIS — E1142 Type 2 diabetes mellitus with diabetic polyneuropathy: Secondary | ICD-10-CM | POA: Diagnosis not present

## 2017-05-27 DIAGNOSIS — I5043 Acute on chronic combined systolic (congestive) and diastolic (congestive) heart failure: Secondary | ICD-10-CM | POA: Diagnosis not present

## 2017-05-27 DIAGNOSIS — E1122 Type 2 diabetes mellitus with diabetic chronic kidney disease: Secondary | ICD-10-CM | POA: Diagnosis not present

## 2017-05-27 DIAGNOSIS — I2510 Atherosclerotic heart disease of native coronary artery without angina pectoris: Secondary | ICD-10-CM | POA: Diagnosis not present

## 2017-05-27 DIAGNOSIS — I130 Hypertensive heart and chronic kidney disease with heart failure and stage 1 through stage 4 chronic kidney disease, or unspecified chronic kidney disease: Secondary | ICD-10-CM | POA: Diagnosis not present

## 2017-05-27 MED ORDER — PREDNISONE 5 MG PO TABS: ORAL_TABLET | ORAL | 0 refills | Status: DC

## 2017-05-27 NOTE — Telephone Encounter (Signed)
I called Levada Dy back and she will have patient take metolazone 2.5 mg today and will let patient know he will need an appointment to see [place] or report to the ER if no response to metolazone.  She will also advise patient to see his PCP ASAP for fever/sinus infection.

## 2017-05-27 NOTE — Telephone Encounter (Signed)
I called the patient. He is still having pain in this right leg more than the left, he is not having weakness in the leg, he is able to exercise but when he first gets out of bed the pain is quite severe.  I will give him a short course of prednisone for 6 days, if the pain is persistent, we may consider MRI of the low back.

## 2017-05-27 NOTE — Telephone Encounter (Signed)
Levada Dy from [facility] called stating that she spoke with Cardiology and they told her to talk with her PCP regarding the high tempeture and questionable sinus infection. See note below from Cardiology:  "[name], RN with [facility] called to report patient has had an additional 3 lbs wt gain overnight with increased shortness of breath.  Wt is up to 212.7 lb from 209.7 lbs yesterday.  Levada Dy feels that patient may have a sinus infection as well that is the cause of his productive cough.  Temp of 101 today.  Patient reported that he has been taking an additional 40 mg of torsemide (take 100 mg in the am as prescribed) since Friday with no relief."

## 2017-05-27 NOTE — Telephone Encounter (Signed)
Discussed with Dr. Haroldine Laws. Please have him take 2.5 mg metolazone. He needs to see his PCP asap for fever. If no response to metolazone. He needs to be seen vs. Go to the ED for worsening SOB.  Arbutus Leas 1:39 PM

## 2017-05-27 NOTE — Addendum Note (Signed)
Addended by: Kathrynn Ducking on: 05/27/2017 05:32 PM   Modules accepted: Orders

## 2017-05-27 NOTE — Telephone Encounter (Signed)
He should come in to be evaluated.

## 2017-05-27 NOTE — Telephone Encounter (Signed)
Spoke with pt, appt scheduled for tomorrow with Dr [name].

## 2017-05-27 NOTE — Telephone Encounter (Signed)
Advanced Heart Failure Triage Encounter  Patient Name: Shane [name]  Date of Call: 05/27/17  Problem: wt gain  [name], RN with [facility] called to report patient has had an additional 3 lbs wt gain overnight with increased shortness of breath.  Wt is up to 212.7 lb from 209.7 lbs yesterday.  Shane [name] feels that patient may have a sinus infection as well that is the cause of his productive cough.  Temp of 101 today.  Patient reported that he has been taking an additional 40 mg of torsemide (take 100 mg in the am as prescribed) since Friday with no relief.   Plan:  I advised Shane [name] to have patient see PCP as soon as possible regarding possible sinus infections with fever. I will send to Jettie Booze, NP regarding wt gain and will call Shane [name] back with her response.   Darron Doom, RN

## 2017-05-27 NOTE — Telephone Encounter (Signed)
Pt calling back due to pain and would like to get the shot of prednisone, please call to schedule

## 2017-05-28 ENCOUNTER — Ambulatory Visit (INDEPENDENT_AMBULATORY_CARE_PROVIDER_SITE_OTHER): Payer: Medicare Other | Admitting: Internal Medicine

## 2017-05-28 ENCOUNTER — Encounter: Payer: Self-pay | Admitting: Internal Medicine

## 2017-05-28 ENCOUNTER — Ambulatory Visit: Payer: Medicare Other

## 2017-05-28 DIAGNOSIS — R509 Fever, unspecified: Secondary | ICD-10-CM | POA: Diagnosis not present

## 2017-05-28 DIAGNOSIS — I255 Ischemic cardiomyopathy: Secondary | ICD-10-CM | POA: Diagnosis not present

## 2017-05-28 DIAGNOSIS — I5033 Acute on chronic diastolic (congestive) heart failure: Secondary | ICD-10-CM | POA: Diagnosis not present

## 2017-05-28 MED ORDER — ATORVASTATIN CALCIUM 10 MG PO TABS: 10.0000 mg | ORAL_TABLET | Freq: Every day | ORAL | 3 refills | Status: DC

## 2017-05-28 NOTE — Assessment & Plan Note (Signed)
Yesterday had increased weight at home of 3 lbs and increased SOB, cough Cardio instructed him to take 1/2 metolazone and he lost one pound No longer having SOB or cough Has edema in posterior thighs, wearing compression socks  Will monitor home weight closely - if increases will contact cardio again for further increase in diuretics

## 2017-05-28 NOTE — Assessment & Plan Note (Signed)
Fever yesterday of 101, also had cough.  No other new or concerning infection symptoms Took prednisone last night for pinched nerve and extra water pill  no longer has cough - which may have been related to fluid overload ? Cause of fever - afebrile today  - no symptoms of an infection  Will just monitor for now -- if he develops further fever or symptoms of a possible infection he will call and I will send in an antibiotic

## 2017-05-28 NOTE — Patient Instructions (Signed)
    Medications reviewed and updated.  No changes recommended at this time.    If you have another fever or any symptoms of an infection please call right away.

## 2017-05-28 NOTE — Progress Notes (Signed)
Subjective:    Patient ID: Shane [name], male    DOB: [birth year], 83 y.o.   MRN: [id number]  HPI He is here for an acute visit.   Pinched nerve in hip:  He did go see neurology and had a EMG test and developed pinched nerve in his hip.  The pain was getting worse. He has been using a cane.  He saw a chiropractor, but his pain got worse.   He called the neurologist and he  prescribed prednisone  - first dose last night.  His pain and walking is better.    Cold symptoms:  He states they started 1-2 days ago.  He started coughing 2 days ago and it was a dry cough.  He had a fever for the first time yesterday of 101.  After taking the prednisone last night his cough is better - he states it is gone.  His fever broke and he has not had a fever since then.  He denies other cold symptoms and states he does not feel he has an infection.   Increase weight / fluid:  He called cardiology yesterday for increased weight at home of 3 lbs. He had some increase is SOB.   He was advised to continue is torsemide and take 1/2 pill of metolazone, which he did .  His weight did decrease by 1 lb.  He is concerned the fluid is increasing since leaving the hospital.  He uses his compression socks on daily.      Medications and allergies reviewed with patient and updated if appropriate.  Patient Active Problem List   Diagnosis Date Noted  . Acute on chronic diastolic heart failure ([place]) 05/01/2017  . Diabetic neuropathy ([place]) 04/25/2017  . Gait abnormality 04/25/2017  . Numbness and tingling of both feet 03/04/2017  . Peripheral neuropathy 03/04/2017  . Carotid artery disease ([place]) 01/09/2017  . Chronic combined systolic and diastolic CHF (congestive heart failure) ([place]) 07/01/2016  . Restrictive lung disease 01/22/2016  . Severe obstructive sleep apnea 12/17/2015  . Essential hypertension 11/16/2015  . 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 LBBB (left bundle branch block) 10/10/2014  . Syncope 10/06/2014  . Pain  in thumb joint with movement of right hand 01/04/2014  . Sinusitis, chronic 07/16/2013  . Asthma, moderate persistent 07/08/2013  . Chronic infection of sinus 07/31/2012  . Benign prostatic hypertrophy without urinary obstruction 12/18/2011  . CAD s/p coronary arthery bypass graft   . BPH (benign prostatic hyperplasia)   . Chronic kidney disease (CKD), stage IV (severe) ([place])   . H/O Spinal stenosis   . DM (diabetes mellitus), type 2 with renal complications ([place]) 40/98/1191  . Benign hypertensive heart disease without heart failure 11/07/2010  . Allergic rhinitis 11/07/2010  . Osteoarthritis 11/07/2010  . Ischemic cardiomyopathy   . Hypercholesterolemia     Current Outpatient Prescriptions on File Prior to Visit  Medication Sig Dispense Refill  . acetaminophen (TYLENOL) 500 MG tablet Take 1,000 mg by mouth every 8 (eight) hours as needed (pain).     Marland Kitchen albuterol (PROVENTIL HFA;VENTOLIN HFA) 108 (90 Base) MCG/ACT inhaler Inhale 2 puffs into the lungs every 4 (four) hours as needed for wheezing or shortness of breath (cough, shortness of breath or wheezing.). (Patient taking differently: Inhale 2 puffs into the lungs every 4 (four) hours as needed for wheezing or shortness of breath (cough). ) 1 Inhaler 1  . amLODipine (NORVASC) 5 MG tablet Take 1 tablet (5 mg total) by mouth daily.  30 tablet 6  . aspirin EC 81 MG tablet Take 81 mg by mouth daily.     Marland Kitchen atorvastatin (LIPITOR) 10 MG tablet Take 1 tablet (10 mg total) by mouth daily. 90 tablet 3  . atorvastatin (LIPITOR) 10 MG tablet TAKE 1 TABLET DAILY 90 tablet 3  . carvedilol (COREG) 6.25 MG tablet Take 1 tablet (6.25 mg total) by mouth 2 (two) times daily with a meal. 60 tablet 5  . cetirizine (ZYRTEC) 10 MG tablet Take 10 mg by mouth at bedtime as needed (seasonal allergies).     . fluocinonide cream (LIDEX) 5.91 % Apply 1 application topically 2 (two) times daily as needed (for itchy rash).     . gabapentin (NEURONTIN) 300 MG capsule  Take 300 mg by mouth at bedtime.    . hydrALAZINE (APRESOLINE) 25 MG tablet Take 1 tablet (25 mg total) by mouth 3 (three) times daily. 90 tablet 5  . metolazone (ZAROXOLYN) 5 MG tablet Take 1 pill as directed by HF clinic if weight goes up 3 lbs overnight, or 5 lbs within one week. Call before taking. 15 tablet 3  . omalizumab (XOLAIR) 150 MG injection Inject 300 mg into the skin every 14 (fourteen) days. Last injection given 04/25/17    . potassium chloride (MICRO-K) 10 MEQ CR capsule Take 10 mEq by mouth daily as needed (with each metolazone dose).     . predniSONE (DELTASONE) 5 MG tablet Begin taking 6 tablets daily, taper by one tablet daily until off the medication. 21 tablet 0  . tadalafil (CIALIS) 20 MG tablet Take 20 mg by mouth daily as needed for erectile dysfunction. Do not exceed 2 doses in 7 days    . testosterone cypionate (DEPOTESTOTERONE CYPIONATE) 100 MG/ML injection Inject 400 mg into the muscle every 21 ( twenty-one) days. For IM use only    . torsemide (DEMADEX) 20 MG tablet Take 5 tablets (100 mg total) by mouth daily. 150 tablet 6  . triamcinolone (NASACORT AQ) 55 MCG/ACT AERO nasal inhaler Place 2 sprays into the nose daily as needed (seasonal allergies).     . zolpidem (AMBIEN) 10 MG tablet TAKE 1/2 TO 1 (ONE-HALF TO ONE) TABLET BY MOUTH AT BEDTIME AS NEEDED FOR SLEEP (Patient taking differently: TAKE 1/2 TABLET (5 MG) BY MOUTH DAILY AT BEDTIME) 30 tablet 1   Current Facility-Administered Medications on File Prior to Visit  Medication Dose Route Frequency Provider Last Rate Last Dose  . omalizumab Arvid Right) injection 300 mg  300 mg Subcutaneous Q14 Days Javier Glazier, MD   300 mg at 04/25/17 1213  . omalizumab Arvid Right) injection 300 mg  300 mg Subcutaneous Q14 Days Javier Glazier, MD   300 mg at 05/12/17 6384    Past Medical History:  Diagnosis Date  . Abnormal nuclear cardiac imaging test Nov 2010   moderate area of infarct in the inferior wall with only minimal  reversibility and EF of 28%  . AICD (automatic cardioverter/defibrillator) present   . Allergic rhinitis 01-08-13   Uses nebulizer for chronic sinus issues and Mucinex.  . Arthritis    osteoarthritis   . Asthma    Extrinic  . Blood transfusion    ? at time of bypass surgery   . BPH (benign prostatic hyperplasia)   . Cellulitis of arm, left    MSSA  . CHF (congestive heart failure) ([place])   . CKD (chronic kidney disease), stage III ([place])    "was told due to meds he  takes"-no Renal workup done  . Colon polyps    adenomatous  . Coronary artery disease    remote CABG in 1985, cath in 2003 by Dr. Lia Foyer with no PCI, last nuclear in 2010 showing scar and EF of 28%.   . Diabetes mellitus   . Diabetic neuropathy ([place]) 04/25/2017  . Dyslipidemia   . Dyspnea   . Gait abnormality 04/25/2017  . Glaucoma 01-08-13   tx. eye drops  . HOH (hard of hearing)   . HTN (hypertension)   . Hypercholesterolemia   . Left ventricular dysfunction    28% per nuclear in 2010 and 35 to 40% per echo in 2010  . Myocardial infarction ([place])    1985  . Neuromuscular disorder ([place])    legs mild paralysis-able to walk"nerve damage"-legs- left leg brace   . Neuropathy   . Pneumonia    hx of several times years ago   . Spinal stenosis     Past Surgical History:  Procedure Laterality Date  . A/V FISTULAGRAM Right 10/17/2016   Procedure: A/V Fistulagram;  Surgeon: Conrad Sorrento, MD;  Location: [place] [facility];  Service: Cardiovascular;  Laterality: Right;  . BACK SURGERY     hx of back surgery x 4   . BASCILIC VEIN TRANSPOSITION Right 09/09/2016   Procedure: BASCILIC VEIN TRANSPOSITION Right Arm;  Surgeon: Rosetta Posner, MD;  Location[facility] OR;  Service: Vascular;  Laterality: Right;  . BI-VENTRICULAR IMPLANTABLE CARDIOVERTER DEFIBRILLATOR N/A 10/10/2014   Procedure: BI-VENTRICULAR IMPLANTABLE CARDIOVERTER DEFIBRILLATOR  (CRT-D);  Surgeon: [name], MD;  Location: [facility] C[facility] LAB;  Service: Cardiovascular;   Laterality: N/A;  . CARDIAC CATHETERIZATION  2003  . Cataract      recent cataract surgery 6'14  . COLONOSCOPY N/A 01/25/2013   Procedure: COLONOSCOPY;  Surgeon: Irene Shipper, MD;  Location: [facility] ENDOSCOPY;  Service: Endoscopy;  Laterality: N/A;  . CORONARY ARTERY BYPASS GRAFT  1985   x 5 vessels  . LUMBAR DISC SURGERY    . PERIPHERAL VASCULAR BALLOON ANGIOPLASTY Right 10/17/2016   Procedure: Peripheral Vascular Balloon Angioplasty;  Surgeon: Conrad Bridge Creek, MD;  Location: [facility];  Service: Cardiovascular;  Laterality: Right;  ARM VEINOUS AND CENTRAL VEIN  . PR POLYSOM 6/>YRS SLEEP 4/> ADDL PARAM ATTND  12/07/2015  . PROSTATE SURGERY    . TOTAL KNEE ARTHROPLASTY  08/01/2011   Procedure: TOTAL KNEE ARTHROPLASTY;  Surgeon: Johnn Hai;  Location: [facility] ORS;  Service: Orthopedics;  Laterality: Right;    Social History   Social History  . Marital status: Married    Spouse name: N/A  . Number of children: 2  . Years of education: N/A   Occupational History  . pastor    Social History Main Topics  . Smoking status: Former Smoker    Packs/day: 1.00    Years: 5.00    Types: Cigarettes, Pipe, Cigars    Quit date: 08/05/1958  . Smokeless tobacco: Never Used  . Alcohol use No  . Drug use: No  . Sexual activity: Not Currently   Other Topics Concern  . None   Social History Narrative   Originally from [place]. He has always lived in [place]. Prior travel to [place], [place], [place], [place] ,[place], & [place]. 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 Has adopted children. Has a dog currently. No bird exposure. No mold exposure. Enjoys reading & traveling.    Family History  Problem Relation Age of Onset  .  Heart attack Father   . Heart disease Father   . Colon polyps Father   . Lung cancer Mother   . Diabetes Sister        x 2  . Heart disease Brother        x 2  . Bone cancer Brother   . Lung disease Neg Hx     Review of Systems  Constitutional: Positive  for fever. Negative for appetite change and chills.  HENT: Positive for rhinorrhea (chronic). Negative for congestion, ear pain, sinus pain and sore throat.   Respiratory: Positive for cough and shortness of breath (from increased fluid - increase fluid pill yesterday). Negative for wheezing.   Cardiovascular: Positive for leg swelling (yesterday - did not have support hose on due to hip pain). Negative for chest pain and palpitations.  Gastrointestinal: Positive for constipation. Negative for abdominal pain and diarrhea.  Genitourinary: Negative for dysuria, frequency and hematuria.  Neurological: Positive for headaches (tylenol). Negative for dizziness and light-headedness.       Objective:   Vitals:   05/28/17 1600  BP: 130/70  Pulse: 69  Resp: 16  Temp: 97.6 F (36.4 C)  SpO2: 96%   Filed Weights   05/28/17 1600  Weight: 220 lb (99.8 kg)   Body mass index is 31.57 kg/m.  Wt Readings from Last 3 Encounters:  05/28/17 220 lb (99.8 kg)  05/13/17 216 lb 9.6 oz (98.2 kg)  05/06/17 206 lb (93.4 kg)     Physical Exam GENERAL APPEARANCE: Appears stated age, well appearing, NAD EYES: conjunctiva clear, no icterus HEENT: bilateral tympanic membranes and ear canals normal, oropharynx with no erythema, no thyromegaly, trachea midline, no cervical or supraclavicular lymphadenopathy LUNGS: Clear to auscultation without wheeze or crackles, unlabored breathing, good air entry bilaterally CARDIOVASCULAR: Normal K4,Q2 2/6 systolic murmurs, no edema in lower legs due to compression socks, positive edema in posterior thighs SKIN: warm and dry        Assessment & Plan:   See Problem List for Assessment and Plan of chronic medical problems.

## 2017-05-29 ENCOUNTER — Ambulatory Visit (INDEPENDENT_AMBULATORY_CARE_PROVIDER_SITE_OTHER): Payer: Self-pay

## 2017-05-29 DIAGNOSIS — E1142 Type 2 diabetes mellitus with diabetic polyneuropathy: Secondary | ICD-10-CM | POA: Diagnosis not present

## 2017-05-29 DIAGNOSIS — I130 Hypertensive heart and chronic kidney disease with heart failure and stage 1 through stage 4 chronic kidney disease, or unspecified chronic kidney disease: Secondary | ICD-10-CM | POA: Diagnosis not present

## 2017-05-29 DIAGNOSIS — I2510 Atherosclerotic heart disease of native coronary artery without angina pectoris: Secondary | ICD-10-CM | POA: Diagnosis not present

## 2017-05-29 DIAGNOSIS — Z95810 Presence of automatic (implantable) cardiac defibrillator: Secondary | ICD-10-CM

## 2017-05-29 DIAGNOSIS — I5043 Acute on chronic combined systolic (congestive) and diastolic (congestive) heart failure: Secondary | ICD-10-CM | POA: Diagnosis not present

## 2017-05-29 DIAGNOSIS — E1122 Type 2 diabetes mellitus with diabetic chronic kidney disease: Secondary | ICD-10-CM | POA: Diagnosis not present

## 2017-05-29 DIAGNOSIS — N184 Chronic kidney disease, stage 4 (severe): Secondary | ICD-10-CM | POA: Diagnosis not present

## 2017-05-29 DIAGNOSIS — I5042 Chronic combined systolic (congestive) and diastolic (congestive) heart failure: Secondary | ICD-10-CM

## 2017-05-29 NOTE — Progress Notes (Signed)
EPIC Encounter for ICM Monitoring  Patient Name: Shane [name] is a 83 y.o. male Date: 05/29/2017 Primary Care Physican: [name], MD Primary Cardiologist:Nahser/Bensimhon Electrophysiologist: Caryl Comes Nephrologist: Hollie Salk at [facility] Weight: 210.5lbs  Bi-V Pacing: 76.6% (dropped from 92.3%on 03/31/2017)        Heart Failure questions reviewed, pt's highest weight in the last week has been been 212.7. He lost 1 lb after taking Metolazone but is still up 4 lbs from baseline, 206 lbs.  Visited PCP on 08/28/2016 for fluid symptoms.     Thoracic impedance continues to be abnormal even after taking 2.5 mg of Metolazone as instructed by Dr Haroldine Laws on 10/23.  Prescribed dosage: Torsemide 20 mg 5tablets (100 mg total)daily. Metolazone 5 mg take 1 pill as directed by HF clinic if weight goes up 3 lbs overnight, or 5 lbs within one week, call before taking.  Labs: 05/13/2017 Creatinine 2.43, BUN 56,   Potassium 3.8, Sodium 136, EGFR 23-27 05/06/2017 Creatinine 2.83, BUN 90, Potassium 3.7, Sodium 140, EGFR 19-23 05/05/2017 Creatinine 2.82, BUN 99, Potassium 3.5, Sodium 137, EGFR 20-23 05/04/2017 Creatinine 3.00, BUN 104, Potassium 3.3, Sodium 137, EGFR 18-21  05/03/2017 Creatinine 3.17, BUN 109, Potassium 3.1, Sodium 136, EGFR 17-20  05/02/2017 Creatinine 3.32, BUN 108, Potassium 3.8, Sodium 134, EGFR 16-19  05/01/2017 Creatinine 3.15, BUN 103, Potassium 3.2, Sodium 131, EGFR 17-21  04/28/2017 Creatinine 2.96, BUN 90, Potassium 3.3, Sodium 138, EGFR 19-22  03/04/2017 Creatinine 2.39, BUN 67, Potassium 3.4, Sodium 137  01/08/2017 Creatinine 2.98, BUN 82, Potassium 3.0, Sodium 136, EGFR 18-21 12/10/2016 Creatinine 2.66, BUN 70, Potassium 3.5, Sodium 138, EGFR 21-24 12/03/2016 Creatinine 2.70, BUN 72, Potassium 3.5, Sodium 138, EGFR 21-24 11/04/2016 Creatinine 3.27, BUN 79, Potassium 3.9, Sodium 137, EGFR 16-19 10/18/2016 Creatinine 2.7, BUN 63. Potassium 3.6, Sodium  138 10/02/2016 Creatinine 2.43, BUN 66, Potassium 3.6, Sodium 139, EGFR 24-28  Recommendations: Copy of ICM check sent to Dr. Haroldine Laws and Dr. Caryl Comes for review and recommendations if needed.   Advised to go to ER if needed for symptoms.  Follow-up plan: ICM clinic phone appointment on 06/02/2017 (manual send).  Office appointment scheduled 06/12/2017 with HF clinic PA/NP.   3 month ICM trend: 05/29/2017   1 Year ICM trend:      Rosalene Billings, RN 05/29/2017 12:45 PM

## 2017-05-30 DIAGNOSIS — E1142 Type 2 diabetes mellitus with diabetic polyneuropathy: Secondary | ICD-10-CM | POA: Diagnosis not present

## 2017-05-30 DIAGNOSIS — N184 Chronic kidney disease, stage 4 (severe): Secondary | ICD-10-CM | POA: Diagnosis not present

## 2017-05-30 DIAGNOSIS — I5043 Acute on chronic combined systolic (congestive) and diastolic (congestive) heart failure: Secondary | ICD-10-CM | POA: Diagnosis not present

## 2017-05-30 DIAGNOSIS — E1122 Type 2 diabetes mellitus with diabetic chronic kidney disease: Secondary | ICD-10-CM | POA: Diagnosis not present

## 2017-05-30 DIAGNOSIS — I130 Hypertensive heart and chronic kidney disease with heart failure and stage 1 through stage 4 chronic kidney disease, or unspecified chronic kidney disease: Secondary | ICD-10-CM | POA: Diagnosis not present

## 2017-05-30 DIAGNOSIS — I2510 Atherosclerotic heart disease of native coronary artery without angina pectoris: Secondary | ICD-10-CM | POA: Diagnosis not present

## 2017-05-30 NOTE — Progress Notes (Signed)
Call to patient.  He said he feels better; the best he has felt in 2 weeks. He is still not completely well.  Weight today was 210 lbs.  Advised if he symptoms worsen over the weekend to call HF clinic for the on call physician or NP/PA or use emergency room.  Reminded him to send remote transmission Monday morning.

## 2017-06-02 ENCOUNTER — Ambulatory Visit (INDEPENDENT_AMBULATORY_CARE_PROVIDER_SITE_OTHER): Payer: Medicare Other

## 2017-06-02 ENCOUNTER — Telehealth: Payer: Self-pay

## 2017-06-02 DIAGNOSIS — I5042 Chronic combined systolic (congestive) and diastolic (congestive) heart failure: Secondary | ICD-10-CM | POA: Diagnosis not present

## 2017-06-02 DIAGNOSIS — Z95810 Presence of automatic (implantable) cardiac defibrillator: Secondary | ICD-10-CM

## 2017-06-02 DIAGNOSIS — I130 Hypertensive heart and chronic kidney disease with heart failure and stage 1 through stage 4 chronic kidney disease, or unspecified chronic kidney disease: Secondary | ICD-10-CM | POA: Diagnosis not present

## 2017-06-02 DIAGNOSIS — N184 Chronic kidney disease, stage 4 (severe): Secondary | ICD-10-CM | POA: Diagnosis not present

## 2017-06-02 DIAGNOSIS — I5043 Acute on chronic combined systolic (congestive) and diastolic (congestive) heart failure: Secondary | ICD-10-CM | POA: Diagnosis not present

## 2017-06-02 DIAGNOSIS — E1142 Type 2 diabetes mellitus with diabetic polyneuropathy: Secondary | ICD-10-CM | POA: Diagnosis not present

## 2017-06-02 DIAGNOSIS — E1122 Type 2 diabetes mellitus with diabetic chronic kidney disease: Secondary | ICD-10-CM | POA: Diagnosis not present

## 2017-06-02 DIAGNOSIS — I2510 Atherosclerotic heart disease of native coronary artery without angina pectoris: Secondary | ICD-10-CM | POA: Diagnosis not present

## 2017-06-02 NOTE — Telephone Encounter (Signed)
Spoke with pt and reminded pt of remote transmission that is due today. Pt verbalized understanding.   

## 2017-06-02 NOTE — Progress Notes (Signed)
EPIC Encounter for ICM Monitoring  Patient Name: ALTO GANDOLFO is a 83 y.o. male Date: 06/02/2017 Primary Care Physican: [name], MD Primary Cardiologist:Nahser/Bensimhon Electrophysiologist: Caryl Comes Nephrologist: Hollie Salk at [facility] Weight:  209.4lbs (baseline is 206 lbs) Bi-V Pacing: 80% (dropped from 92.3%on 03/31/2017)      Heart Failure questions reviewed, pt reported weight has dropped another pound but still about 3 lbs higher than base weight. Denied any shortness of breath.   Thoracic impedance improving and trending toward baseline.   Prescribed dosage: Torsemide 20 mg 5tablets (100 mg total)daily. Metolazone 5 mg take 1 pill as directed by HF clinic if weight goes up 3 lbs overnight, or 5 lbs within one week, call before taking.  Labs: 05/13/2017 Creatinine 2.43, BUN 56, Potassium 3.8, Sodium 136, EGFR 23-27 05/06/2017 Creatinine 2.83, BUN 90, Potassium 3.7, Sodium 140, EGFR 19-23 05/05/2017 Creatinine 2.82, BUN 99, Potassium 3.5, Sodium 137, EGFR 20-23 05/04/2017 Creatinine 3.00, BUN 104, Potassium 3.3, Sodium 137, EGFR 18-21  05/03/2017 Creatinine 3.17, BUN 109, Potassium 3.1, Sodium 136, EGFR 17-20  05/02/2017 Creatinine 3.32, BUN 108, Potassium 3.8, Sodium 134, EGFR 16-19  05/01/2017 Creatinine 3.15, BUN 103, Potassium 3.2, Sodium 131, EGFR 17-21  04/28/2017 Creatinine 2.96, BUN 90, Potassium 3.3, Sodium 138, EGFR 19-22  03/04/2017 Creatinine 2.39, BUN 67, Potassium 3.4, Sodium 137  01/08/2017 Creatinine 2.98, BUN 82, Potassium 3.0, Sodium 136, EGFR 18-21 12/10/2016 Creatinine 2.66, BUN 70, Potassium 3.5, Sodium 138, EGFR 21-24 12/03/2016 Creatinine 2.70, BUN 72, Potassium 3.5, Sodium 138, EGFR 21-24 11/04/2016 Creatinine 3.27, BUN 79, Potassium 3.9, Sodium 137, EGFR 16-19 10/18/2016 Creatinine 2.7, BUN 63. Potassium 3.6, Sodium 138  Recommendations: No changes.   Encouraged to call for fluid symptoms.  Follow-up plan: ICM clinic phone  appointment on 06/19/2017.  Office appointment scheduled 06/12/2017 with HF clinic NP/PA.  Copy of ICM check sent to Dr. Caryl Comes and Dr. Haroldine Laws.   3 month ICM trend: 06/02/2017   1 Year ICM trend:      Rosalene Billings, RN 06/02/2017 2:06 PM

## 2017-06-04 ENCOUNTER — Ambulatory Visit (INDEPENDENT_AMBULATORY_CARE_PROVIDER_SITE_OTHER): Payer: Medicare Other | Admitting: Podiatry

## 2017-06-04 ENCOUNTER — Ambulatory Visit: Payer: Medicare Other | Admitting: Podiatry

## 2017-06-04 ENCOUNTER — Encounter: Payer: Self-pay | Admitting: Podiatry

## 2017-06-04 DIAGNOSIS — S91209S Unspecified open wound of unspecified toe(s) with damage to nail, sequela: Secondary | ICD-10-CM

## 2017-06-04 DIAGNOSIS — I255 Ischemic cardiomyopathy: Secondary | ICD-10-CM

## 2017-06-04 DIAGNOSIS — E1142 Type 2 diabetes mellitus with diabetic polyneuropathy: Secondary | ICD-10-CM | POA: Diagnosis not present

## 2017-06-04 DIAGNOSIS — B351 Tinea unguium: Secondary | ICD-10-CM

## 2017-06-04 DIAGNOSIS — I5043 Acute on chronic combined systolic (congestive) and diastolic (congestive) heart failure: Secondary | ICD-10-CM | POA: Diagnosis not present

## 2017-06-04 DIAGNOSIS — N184 Chronic kidney disease, stage 4 (severe): Secondary | ICD-10-CM | POA: Diagnosis not present

## 2017-06-04 DIAGNOSIS — E1122 Type 2 diabetes mellitus with diabetic chronic kidney disease: Secondary | ICD-10-CM | POA: Diagnosis not present

## 2017-06-04 DIAGNOSIS — I130 Hypertensive heart and chronic kidney disease with heart failure and stage 1 through stage 4 chronic kidney disease, or unspecified chronic kidney disease: Secondary | ICD-10-CM | POA: Diagnosis not present

## 2017-06-04 DIAGNOSIS — I2510 Atherosclerotic heart disease of native coronary artery without angina pectoris: Secondary | ICD-10-CM | POA: Diagnosis not present

## 2017-06-04 NOTE — Progress Notes (Addendum)
  Subjective:  Patient ID: Shane [name], male    DOB: 02-04-1934,  MRN: [id number]  Chief Complaint  Patient presents with  . Nail Problem    i am here to get my toenails trimmed    83 y.o. male returns for diabetic foot care.  States his left fifth toe nail came off 2 weeks ago. Objective:  There were no vitals filed for this visit. General AA&O x3. Normal mood and affect.  Vascular Dorsalis pedis pulses present 1+ bilaterally  Posterior tibial pulses absent bilaterally  Capillary refill normal to all digits. Pedal hair growth diminished.  Neurologic Epicritic sensation present bilaterally.  Dermatologic No open lesions. Interspaces clear of maceration.  Normal skin temperature and turgor. Hyperkeratotic lesions: none bilaterally. Nails: brittle, clubbing, onychomycosis, thickening  Orthopedic: No history of amputation. MMT 5/5 in dorsiflexion, plantarflexion, inversion, and eversion. Normal lower extremity joint ROM without pain or crepitus.   Assessment & Plan:  Patient was evaluated and treated and all questions answered.  Diabetes with PAD, Onychomycosis -Educated on diabetic footcare. Diabetic risk level 1 -At risk foot care provided as below.  Procedure: Nail Debridement Rationale: Patient meets criteria for routine foot care due to Class B findings Type of Debridement: manual, sharp debridement. Instrumentation: Nail nipper, rotary burr. Number of Nails: 9

## 2017-06-04 NOTE — Telephone Encounter (Signed)
Shane [name] called today to report pts weight gain. Weight Monday was 209lbs Letta Moynahan he is now 213lbs 2oz. No increased shortness of breath or swelling. Spoke with Susie who stated patient needed to be seen in APP clinic tomorrow. Scheduled pt for APP clinic at 2:30pm tomorrow pt aware and agreeable.

## 2017-06-05 ENCOUNTER — Ambulatory Visit (HOSPITAL_COMMUNITY)
Admission: RE | Admit: 2017-06-05 | Discharge: 2017-06-05 | Disposition: A | Discharge status: Home / Self Care | Payer: Medicare Other | Source: Ambulatory Visit | Attending: Internal Medicine | Admitting: Internal Medicine

## 2017-06-05 ENCOUNTER — Ambulatory Visit: Payer: Medicare Other

## 2017-06-05 ENCOUNTER — Encounter (HOSPITAL_COMMUNITY): Payer: Self-pay

## 2017-06-05 VITALS — BP 144/62 | HR 68 | Wt 219.0 lb

## 2017-06-05 DIAGNOSIS — E291 Testicular hypofunction: Secondary | ICD-10-CM | POA: Diagnosis not present

## 2017-06-05 DIAGNOSIS — I25119 Atherosclerotic heart disease of native coronary artery with unspecified angina pectoris: Secondary | ICD-10-CM | POA: Diagnosis not present

## 2017-06-05 DIAGNOSIS — N189 Chronic kidney disease, unspecified: Secondary | ICD-10-CM | POA: Diagnosis not present

## 2017-06-05 DIAGNOSIS — Z95810 Presence of automatic (implantable) cardiac defibrillator: Secondary | ICD-10-CM | POA: Diagnosis not present

## 2017-06-05 DIAGNOSIS — I10 Essential (primary) hypertension: Secondary | ICD-10-CM

## 2017-06-05 DIAGNOSIS — I5042 Chronic combined systolic (congestive) and diastolic (congestive) heart failure: Secondary | ICD-10-CM | POA: Diagnosis not present

## 2017-06-05 DIAGNOSIS — E118 Type 2 diabetes mellitus with unspecified complications: Secondary | ICD-10-CM

## 2017-06-05 LAB — BASIC METABOLIC PANEL
Anion gap: 7 (ref 5–15)
BUN: 53 mg/dL — AB (ref 6–20)
CO2: 34 mmol/L — ABNORMAL HIGH (ref 22–32)
CREATININE: 2.23 mg/dL — AB (ref 0.61–1.24)
Calcium: 8.6 mg/dL — ABNORMAL LOW (ref 8.9–10.3)
Chloride: 98 mmol/L — ABNORMAL LOW (ref 101–111)
GFR calc Af Amer: 30 mL/min — ABNORMAL LOW (ref >60)
GFR, EST NON AFRICAN AMERICAN: 26 mL/min — AB (ref >60)
GLUCOSE: 143 mg/dL — AB (ref 65–99)
Potassium: 3.7 mmol/L (ref 3.5–5.1)
SODIUM: 139 mmol/L (ref 135–145)

## 2017-06-05 NOTE — Progress Notes (Signed)
Patient ID: Shane [name], male   DOB: November 10, 1933, 83 y.o.   MRN: [id number]    Advanced Heart Failure Clinic Note   Date:  06/05/2017   ID:  [name], DOB 02/02/1934, MRN [id number]  PCP:  [name], MD  Cardiologist:   Nahser  Nephrologist: Hollie Salk  History of Present Illness: KHALEL ALMS is a 83 y.o. male with CAD s/p CABG, systolic HF EF 62-22% and CKD 4 referred to the HF Clinic by Dr. Acie Fredrickson.   He is retired from [employer]. Had been doing fine until March or April 2018 when he started having more DOE. Felt he couldn't breath as deeply. MDT ICD was optimized. Took him off Toprol and switched to carvedilol. Also started Entresto. Began to gain fluid. Then lasix was started. (this was new). Entresto stopped. Creatine went from 1.9 to 3.3 then back to to 2.7.   Admitted 05/01/17-05/06/17 with cardiorenal syndrome. Discussed with Dr. Hollie Salk (His nephrologist) who agreed on admission for IV lasix trial. (He does have a patent fistula in place for initiation of HD once required). Pt initially had poor response to IV lasix and Renal consulted with plans for HD initiation. However on day 2, he began to respond to high-dose lasix with increased urine output and decreasing BUN/Cr. BP active meds cut back to allow more room for renal perfusion. Overall he diuresed 9.6 L and down 12 lbs on lasix 120 mg IV BID. Pt examined am of 05/06/17. Discharge weight was 206 pounds.   He returns today for add on for weight gain. Weight at home up another 3-4 lbs and more SOB. + Orthopnea. SOB with changing clothes and bathing. Denies chest pains or palpitations. Taking torsemide 100 mg daily. Took a metolazone about 10 days ago and did have some relief  Ut not back to baseline. UO not as impressive despite increased torsemide. Denies PND. Gets around the house OK. Doesn't feel like he is struggling now, but slowly getting worse.  +ab bloating.   Optivol: Fluid index trending up and now up to  threshold. No VT/VF. Activity level down. Personally reviewed    Past Medical History:  Diagnosis Date  . Abnormal nuclear cardiac imaging test Nov 2010   moderate area of infarct in the inferior wall with only minimal reversibility and EF of 28%  . AICD (automatic cardioverter/defibrillator) present   . Allergic rhinitis 01-08-13   Uses nebulizer for chronic sinus issues and Mucinex.  . Arthritis    osteoarthritis   . Asthma    Extrinic  . Blood transfusion    ? at time of bypass surgery   . BPH (benign prostatic hyperplasia)   . Cellulitis of arm, left    MSSA  . CHF (congestive heart failure) ([place])   . CKD (chronic kidney disease), stage III ([place])    "was told due to meds he takes"-no Renal workup done  . Colon polyps    adenomatous  . Coronary artery disease    remote CABG in 1985, cath in 2003 by Dr. Lia Foyer with no PCI, last nuclear in 2010 showing scar and EF of 28%.   . Diabetes mellitus   . Diabetic neuropathy ([place]) 04/25/2017  . Dyslipidemia   . Dyspnea   . Gait abnormality 04/25/2017  . Glaucoma 01-08-13   tx. eye drops  . HOH (hard of hearing)   . HTN (hypertension)   . Hypercholesterolemia   . Left ventricular dysfunction    28% per nuclear  in 2010 and 35 to 40% per echo in 2010  . Myocardial infarction ([place])    1985  . Neuromuscular disorder ([place])    legs mild paralysis-able to walk"nerve damage"-legs- left leg brace   . Neuropathy   . Pneumonia    hx of several times years ago   . Spinal stenosis     Past Surgical History:  Procedure Laterality Date  . A/V FISTULAGRAM Right 10/17/2016   Procedure: A/V Fistulagram;  Surgeon: Conrad Nederland, MD;  Location: [facility];  Service: Cardiovascular;  Laterality: Right;  . BACK SURGERY     hx of back surgery x 4   . BASCILIC VEIN TRANSPOSITION Right 09/09/2016   Procedure: BASCILIC VEIN TRANSPOSITION Right Arm;  Surgeon: Rosetta Posner, MD;  [facility] OR;  Service: Vascular;  Laterality: Right;  .  BI-VENTRICULAR IMPLANTABLE CARDIOVERTER DEFIBRILLATOR N/A 10/10/2014   Procedure: BI-VENTRICULAR IMPLANTABLE CARDIOVERTER DEFIBRILLATOR  (CRT-D);  Surgeon: [name], MD;  [facility];  Service: Cardiovascular;  Laterality: N/A;  . CARDIAC CATHETERIZATION  2003  . Cataract      recent cataract surgery 6'14  . COLONOSCOPY N/A 01/25/2013   Procedure: COLONOSCOPY;  Surgeon: Irene Shipper, MD;  Location: [facility] ENDOSCOPY;  Service: Endoscopy;  Laterality: N/A;  . CORONARY ARTERY BYPASS GRAFT  1985   x 5 vessels  . LUMBAR DISC SURGERY    . PERIPHERAL VASCULAR BALLOON ANGIOPLASTY Right 10/17/2016   Procedure: Peripheral Vascular Balloon Angioplasty;  Surgeon: Conrad [place], MD;  Location: [facility];  Service: Cardiovascular;  Laterality: Right;  ARM VEINOUS AND CENTRAL VEIN  . PR POLYSOM 6/>YRS SLEEP 4/> ADDL PARAM ATTND  12/07/2015  . PROSTATE SURGERY    . TOTAL KNEE ARTHROPLASTY  08/01/2011   Procedure: TOTAL KNEE ARTHROPLASTY;  Surgeon: Johnn Hai;  Location: [facility] ORS;  Service: Orthopedics;  Laterality: Right;    Current Outpatient Prescriptions  Medication Sig Dispense Refill  . acetaminophen (TYLENOL) 500 MG tablet Take 1,000 mg by mouth every 8 (eight) hours as needed (pain).     Marland Kitchen albuterol (PROVENTIL HFA;VENTOLIN HFA) 108 (90 Base) MCG/ACT inhaler Inhale 2 puffs into the lungs every 4 (four) hours as needed for wheezing or shortness of breath (cough, shortness of breath or wheezing.). 1 Inhaler 1  . amLODipine (NORVASC) 5 MG tablet Take 1 tablet (5 mg total) by mouth daily. 30 tablet 6  . aspirin EC 81 MG tablet Take 81 mg by mouth daily.     Marland Kitchen atorvastatin (LIPITOR) 10 MG tablet Take 1 tablet (10 mg total) by mouth daily. 90 tablet 3  . carvedilol (COREG) 6.25 MG tablet Take 1 tablet (6.25 mg total) by mouth 2 (two) times daily with a meal. 60 tablet 5  . cetirizine (ZYRTEC) 10 MG tablet Take 10 mg by mouth at bedtime as needed (seasonal allergies).     . fluocinonide  cream (LIDEX) 6.96 % Apply 1 application topically 2 (two) times daily as needed (for itchy rash).     . gabapentin (NEURONTIN) 300 MG capsule Take 300 mg by mouth at bedtime.    . hydrALAZINE (APRESOLINE) 25 MG tablet Take 1 tablet (25 mg total) by mouth 3 (three) times daily. 90 tablet 5  . omalizumab (XOLAIR) 150 MG injection Inject 300 mg into the skin every 14 (fourteen) days. Last injection given 04/25/17    . tadalafil (CIALIS) 20 MG tablet Take 20 mg by mouth daily as needed for erectile  dysfunction. Do not exceed 2 doses in 7 days    . testosterone cypionate (DEPOTESTOTERONE CYPIONATE) 100 MG/ML injection Inject 400 mg into the muscle every 21 ( twenty-one) days. For IM use only    . torsemide (DEMADEX) 20 MG tablet Take 5 tablets (100 mg total) by mouth daily. 150 tablet 6  . triamcinolone (NASACORT AQ) 55 MCG/ACT AERO nasal inhaler Place 2 sprays into the nose daily as needed (seasonal allergies).     . zolpidem (AMBIEN) 10 MG tablet TAKE 1/2 TO 1 (ONE-HALF TO ONE) TABLET BY MOUTH AT BEDTIME AS NEEDED FOR SLEEP 30 tablet 1  . metolazone (ZAROXOLYN) 5 MG tablet Take 1 pill as directed by HF clinic if weight goes up 3 lbs overnight, or 5 lbs within one week. Call before taking. (Patient not taking: Reported on 06/05/2017) 15 tablet 3  . potassium chloride (MICRO-K) 10 MEQ CR capsule Take 10 mEq by mouth daily as needed (with each metolazone dose).      Current Facility-Administered Medications  Medication Dose Route Frequency Provider Last Rate Last Dose  . omalizumab Arvid Right) injection 300 mg  300 mg Subcutaneous Q14 Days Javier Glazier, MD   300 mg at 04/25/17 1213  . omalizumab Arvid Right) injection 300 mg  300 mg Subcutaneous Q14 Days Javier Glazier, MD   300 mg at 05/12/17 3662   Allergies:   Codeine; Hydrocodone; Pseudoephedrine; and Azithromycin   Social History:  The patient  reports that he quit smoking about 58 years ago. His smoking use included Cigarettes, Pipe, and Cigars.  He has a 5.00 pack-year smoking history. He has never used smokeless tobacco. He reports that he does not drink alcohol or use drugs.   Family History:  The patient's family history includes Bone cancer in his brother; Colon polyps in his father; Diabetes in his sister; Heart attack in his father; Heart disease in his brother and father; Lung cancer in his mother.   Review of systems complete and found to be negative unless listed in HPI.    PHYSICAL EXAM: Vitals:   06/05/17 1425  BP: (!) 144/62  Pulse: 68  SpO2: 98%  Weight: 219 lb (99.3 kg)     Wt Readings from Last 3 Encounters:  06/05/17 219 lb (99.3 kg)  05/28/17 220 lb (99.8 kg)  05/13/17 216 lb 9.6 oz (98.2 kg)    Physical Exam General: Elderly appearing and obese. No resp difficulty. HEENT: Normal. Anicteric  Neck: Supple. JVP 12-14 cm +. Carotids 2+ bilat; no bruits. No thyromegaly or nodule noted. Cor: PMI nondisplaced. RRR. 2/6 TR murmur.  Lungs: CTAB, normal effort. Abdomen: Soft, non-tender, +distended, no HSM. No bruits or masses. +BS  Extremities: No cyanosis, clubbing, or rash. 1-2+ BLE. RUE AVF with thrill, matured.  Neuro: Alert & orientedx3, cranial nerves grossly intact. moves all 4 extremities w/o difficulty. Affect pleasant     ASSESSMENT AND PLAN: 1. Chronic systolic congestive heart failure: ICM. Echo EF 40-45% in 11/2015.  - NYHA IIIb - Volume status elevated on exam and Optivol.  - Increase torsemide to 100 mg BID x 3 days, and then go back to torsemide 100 gm daily.  - Continue Coreg 25 mg BID - He saw Dr. Hollie Salk several weeks ago. Discussed with her today. Will need close follow up and likely to start HD soon.   2. Coronary artery disease: history of remote coronary artery bypass grafting 1985 - No s/s of ischemia.    - Last cath 2003, LM  and RCA occluded. LIMA to LAD patent. RIMA to OM patent. SVG to PDA occluded.   3. CKD 4- creatinine baseline 2.5-2.8 - Discussed with Dr. Hollie Salk. Pt will need  dialysis very soon. Will need to move up outpatient visit.   4. Hyperlipidemia - Continue atorvastatin. No change.   5. Diabetes mellitus - Per PCP.   6. HTN - Continue current regimen, allowing for higher BP for renal perfusion.   - Will not add anything currently with upcoming dialysis.   Shirley Friar, PA-C  2:42 PM  Patient seen and examined with the above-signed Advanced Practice Provider and/or Housestaff. I personally reviewed laboratory data, imaging studies and relevant notes. I independently examined the patient and formulated the important aspects of the plan. I have edited the note to reflect any of my changes or salient points. I have personally discussed the plan with the patient and/or family.  He is volume overloaded again. Remains very tenuous. Agree with pain to increase torsemide. I called Dr. Hollie Salk and discussed with her. She will see him back in the office soon and discuss timing of HD initiation. Check labs today. ICD interrogated personally in clinic today.   Glori Bickers, MD  7:55 PM

## 2017-06-05 NOTE — Progress Notes (Signed)
Advanced Heart Failure Medication Review by a Pharmacist  Does the patient  feel that his/her medications are working for him/her?  yes  Has the patient been experiencing any side effects to the medications prescribed?  no  Does the patient measure his/her own blood pressure or blood glucose at home?  yes   Does the patient have any problems obtaining medications due to transportation or finances?   no  Understanding of regimen: good Understanding of indications: good Potential of compliance: good Patient understands to avoid NSAIDs. Patient understands to avoid decongestants.  Issues to address at subsequent visits: None   Pharmacist comments: Shane [name] is a pleasant 83 yo M presenting with his wife and without a medication list but with good recall of his regimen. He reports good compliance with his regimen and did not have any specific medication-related questions or concerns for me at this time.   Ruta Hinds. Velva Harman, PharmD, BCPS, CPP Clinical Pharmacist Pager: [phone] Phone: [phone] 06/05/2017 2:28 PM      Time with patient: 10 minutes Preparation and documentation time: 2 minutes Total time: 12 minutes

## 2017-06-05 NOTE — Patient Instructions (Signed)
Take Torsemide 100 mg twice daily for THREE DAYS.  Routine lab work today. Will notify you of abnormal results, otherwise no news is good news!  Return next week for lab work.  Follow up 2 weeks with Oda Kilts PA-C.  Take all medication as prescribed the day of your appointment. Bring all medications with you to your appointment.  Do the following things EVERYDAY: 1) Weigh yourself in the morning before breakfast. Write it down and keep it in a log. 2) Take your medicines as prescribed 3) Eat low salt foods-Limit salt (sodium) to 2000 mg per day.  4) Stay as active as you can everyday 5) Limit all fluids for the day to less than 2 liters

## 2017-06-06 ENCOUNTER — Ambulatory Visit (INDEPENDENT_AMBULATORY_CARE_PROVIDER_SITE_OTHER): Payer: Medicare Other

## 2017-06-06 DIAGNOSIS — E1142 Type 2 diabetes mellitus with diabetic polyneuropathy: Secondary | ICD-10-CM | POA: Diagnosis not present

## 2017-06-06 DIAGNOSIS — N184 Chronic kidney disease, stage 4 (severe): Secondary | ICD-10-CM | POA: Diagnosis not present

## 2017-06-06 DIAGNOSIS — J4540 Moderate persistent asthma, uncomplicated: Secondary | ICD-10-CM

## 2017-06-06 DIAGNOSIS — I5043 Acute on chronic combined systolic (congestive) and diastolic (congestive) heart failure: Secondary | ICD-10-CM | POA: Diagnosis not present

## 2017-06-06 DIAGNOSIS — E1122 Type 2 diabetes mellitus with diabetic chronic kidney disease: Secondary | ICD-10-CM | POA: Diagnosis not present

## 2017-06-06 DIAGNOSIS — I130 Hypertensive heart and chronic kidney disease with heart failure and stage 1 through stage 4 chronic kidney disease, or unspecified chronic kidney disease: Secondary | ICD-10-CM | POA: Diagnosis not present

## 2017-06-06 DIAGNOSIS — I2510 Atherosclerotic heart disease of native coronary artery without angina pectoris: Secondary | ICD-10-CM | POA: Diagnosis not present

## 2017-06-09 ENCOUNTER — Telehealth: Payer: Self-pay | Admitting: Pulmonary Disease

## 2017-06-09 NOTE — Telephone Encounter (Signed)
#   vials:4 Ordered date:06/09/17 Shipping Date:06/09/17

## 2017-06-10 NOTE — Telephone Encounter (Signed)
#   Vials:4 Arrival Date:06/10/17 Lot #:[id number] Exp Date:10/2020

## 2017-06-11 DIAGNOSIS — E1122 Type 2 diabetes mellitus with diabetic chronic kidney disease: Secondary | ICD-10-CM | POA: Diagnosis not present

## 2017-06-11 DIAGNOSIS — I5043 Acute on chronic combined systolic (congestive) and diastolic (congestive) heart failure: Secondary | ICD-10-CM | POA: Diagnosis not present

## 2017-06-11 DIAGNOSIS — I2510 Atherosclerotic heart disease of native coronary artery without angina pectoris: Secondary | ICD-10-CM | POA: Diagnosis not present

## 2017-06-11 DIAGNOSIS — E1142 Type 2 diabetes mellitus with diabetic polyneuropathy: Secondary | ICD-10-CM | POA: Diagnosis not present

## 2017-06-11 DIAGNOSIS — I130 Hypertensive heart and chronic kidney disease with heart failure and stage 1 through stage 4 chronic kidney disease, or unspecified chronic kidney disease: Secondary | ICD-10-CM | POA: Diagnosis not present

## 2017-06-11 DIAGNOSIS — N184 Chronic kidney disease, stage 4 (severe): Secondary | ICD-10-CM | POA: Diagnosis not present

## 2017-06-11 MED ORDER — OMALIZUMAB 150 MG ~~LOC~~ SOLR
300.0000 mg | Freq: Once | SUBCUTANEOUS | Status: AC
  Administered 2017-06-06: 300 mg via SUBCUTANEOUS

## 2017-06-12 ENCOUNTER — Encounter (HOSPITAL_COMMUNITY): Payer: Medicare Other

## 2017-06-12 ENCOUNTER — Ambulatory Visit (HOSPITAL_COMMUNITY)
Admission: RE | Admit: 2017-06-12 | Discharge: 2017-06-12 | Disposition: A | Discharge status: Home / Self Care | Payer: Medicare Other | Source: Ambulatory Visit | Attending: Internal Medicine | Admitting: Internal Medicine

## 2017-06-12 DIAGNOSIS — N184 Chronic kidney disease, stage 4 (severe): Secondary | ICD-10-CM | POA: Diagnosis not present

## 2017-06-12 DIAGNOSIS — J4540 Moderate persistent asthma, uncomplicated: Secondary | ICD-10-CM | POA: Diagnosis not present

## 2017-06-12 DIAGNOSIS — M1990 Unspecified osteoarthritis, unspecified site: Secondary | ICD-10-CM | POA: Diagnosis not present

## 2017-06-12 DIAGNOSIS — Z95810 Presence of automatic (implantable) cardiac defibrillator: Secondary | ICD-10-CM

## 2017-06-12 DIAGNOSIS — N400 Enlarged prostate without lower urinary tract symptoms: Secondary | ICD-10-CM | POA: Diagnosis not present

## 2017-06-12 DIAGNOSIS — M4800 Spinal stenosis, site unspecified: Secondary | ICD-10-CM | POA: Diagnosis not present

## 2017-06-12 DIAGNOSIS — E1122 Type 2 diabetes mellitus with diabetic chronic kidney disease: Secondary | ICD-10-CM | POA: Diagnosis not present

## 2017-06-12 DIAGNOSIS — I5042 Chronic combined systolic (congestive) and diastolic (congestive) heart failure: Secondary | ICD-10-CM | POA: Diagnosis not present

## 2017-06-12 DIAGNOSIS — Z7982 Long term (current) use of aspirin: Secondary | ICD-10-CM

## 2017-06-12 DIAGNOSIS — D631 Anemia in chronic kidney disease: Secondary | ICD-10-CM | POA: Diagnosis not present

## 2017-06-12 DIAGNOSIS — Z87891 Personal history of nicotine dependence: Secondary | ICD-10-CM

## 2017-06-12 DIAGNOSIS — I447 Left bundle-branch block, unspecified: Secondary | ICD-10-CM | POA: Diagnosis not present

## 2017-06-12 DIAGNOSIS — I5043 Acute on chronic combined systolic (congestive) and diastolic (congestive) heart failure: Secondary | ICD-10-CM | POA: Diagnosis not present

## 2017-06-12 DIAGNOSIS — I130 Hypertensive heart and chronic kidney disease with heart failure and stage 1 through stage 4 chronic kidney disease, or unspecified chronic kidney disease: Secondary | ICD-10-CM | POA: Diagnosis not present

## 2017-06-12 DIAGNOSIS — I2510 Atherosclerotic heart disease of native coronary artery without angina pectoris: Secondary | ICD-10-CM | POA: Diagnosis not present

## 2017-06-12 DIAGNOSIS — H409 Unspecified glaucoma: Secondary | ICD-10-CM

## 2017-06-12 DIAGNOSIS — E1142 Type 2 diabetes mellitus with diabetic polyneuropathy: Secondary | ICD-10-CM | POA: Diagnosis not present

## 2017-06-12 LAB — BASIC METABOLIC PANEL
ANION GAP: 8 (ref 5–15)
BUN: 40 mg/dL — AB (ref 6–20)
CALCIUM: 8.4 mg/dL — AB (ref 8.9–10.3)
CO2: 30 mmol/L (ref 22–32)
Chloride: 97 mmol/L — ABNORMAL LOW (ref 101–111)
Creatinine, Ser: 2.44 mg/dL — ABNORMAL HIGH (ref 0.61–1.24)
GFR calc Af Amer: 27 mL/min — ABNORMAL LOW (ref >60)
GFR, EST NON AFRICAN AMERICAN: 23 mL/min — AB (ref >60)
GLUCOSE: 192 mg/dL — AB (ref 65–99)
POTASSIUM: 3.6 mmol/L (ref 3.5–5.1)
SODIUM: 135 mmol/L (ref 135–145)

## 2017-06-18 DIAGNOSIS — E1142 Type 2 diabetes mellitus with diabetic polyneuropathy: Secondary | ICD-10-CM | POA: Diagnosis not present

## 2017-06-18 DIAGNOSIS — I5043 Acute on chronic combined systolic (congestive) and diastolic (congestive) heart failure: Secondary | ICD-10-CM | POA: Diagnosis not present

## 2017-06-18 DIAGNOSIS — I130 Hypertensive heart and chronic kidney disease with heart failure and stage 1 through stage 4 chronic kidney disease, or unspecified chronic kidney disease: Secondary | ICD-10-CM | POA: Diagnosis not present

## 2017-06-18 DIAGNOSIS — E1122 Type 2 diabetes mellitus with diabetic chronic kidney disease: Secondary | ICD-10-CM | POA: Diagnosis not present

## 2017-06-18 DIAGNOSIS — N184 Chronic kidney disease, stage 4 (severe): Secondary | ICD-10-CM | POA: Diagnosis not present

## 2017-06-18 DIAGNOSIS — I2510 Atherosclerotic heart disease of native coronary artery without angina pectoris: Secondary | ICD-10-CM | POA: Diagnosis not present

## 2017-06-18 DIAGNOSIS — Z23 Encounter for immunization: Secondary | ICD-10-CM | POA: Diagnosis not present

## 2017-06-19 ENCOUNTER — Ambulatory Visit (INDEPENDENT_AMBULATORY_CARE_PROVIDER_SITE_OTHER): Payer: Medicare Other

## 2017-06-19 ENCOUNTER — Ambulatory Visit (HOSPITAL_COMMUNITY)
Admission: RE | Admit: 2017-06-19 | Discharge: 2017-06-19 | Disposition: A | Discharge status: Home / Self Care | Payer: Medicare Other | Source: Ambulatory Visit | Attending: Internal Medicine | Admitting: Internal Medicine

## 2017-06-19 ENCOUNTER — Encounter (HOSPITAL_COMMUNITY): Payer: Self-pay

## 2017-06-19 VITALS — BP 168/80 | HR 97 | Wt 216.4 lb

## 2017-06-19 DIAGNOSIS — Z79899 Other long term (current) drug therapy: Secondary | ICD-10-CM | POA: Insufficient documentation

## 2017-06-19 DIAGNOSIS — E118 Type 2 diabetes mellitus with unspecified complications: Secondary | ICD-10-CM | POA: Diagnosis not present

## 2017-06-19 DIAGNOSIS — I5042 Chronic combined systolic (congestive) and diastolic (congestive) heart failure: Secondary | ICD-10-CM | POA: Diagnosis not present

## 2017-06-19 DIAGNOSIS — I25810 Atherosclerosis of coronary artery bypass graft(s) without angina pectoris: Secondary | ICD-10-CM | POA: Insufficient documentation

## 2017-06-19 DIAGNOSIS — I5022 Chronic systolic (congestive) heart failure: Secondary | ICD-10-CM | POA: Insufficient documentation

## 2017-06-19 DIAGNOSIS — J45909 Unspecified asthma, uncomplicated: Secondary | ICD-10-CM | POA: Insufficient documentation

## 2017-06-19 DIAGNOSIS — E1122 Type 2 diabetes mellitus with diabetic chronic kidney disease: Secondary | ICD-10-CM | POA: Diagnosis not present

## 2017-06-19 DIAGNOSIS — I10 Essential (primary) hypertension: Secondary | ICD-10-CM | POA: Diagnosis not present

## 2017-06-19 DIAGNOSIS — I130 Hypertensive heart and chronic kidney disease with heart failure and stage 1 through stage 4 chronic kidney disease, or unspecified chronic kidney disease: Secondary | ICD-10-CM | POA: Diagnosis not present

## 2017-06-19 DIAGNOSIS — I252 Old myocardial infarction: Secondary | ICD-10-CM | POA: Insufficient documentation

## 2017-06-19 DIAGNOSIS — Z87891 Personal history of nicotine dependence: Secondary | ICD-10-CM | POA: Diagnosis not present

## 2017-06-19 DIAGNOSIS — E785 Hyperlipidemia, unspecified: Secondary | ICD-10-CM | POA: Insufficient documentation

## 2017-06-19 DIAGNOSIS — N184 Chronic kidney disease, stage 4 (severe): Secondary | ICD-10-CM

## 2017-06-19 DIAGNOSIS — Z95810 Presence of automatic (implantable) cardiac defibrillator: Secondary | ICD-10-CM | POA: Diagnosis not present

## 2017-06-19 DIAGNOSIS — I2510 Atherosclerotic heart disease of native coronary artery without angina pectoris: Secondary | ICD-10-CM

## 2017-06-19 LAB — BASIC METABOLIC PANEL
ANION GAP: 8 (ref 5–15)
BUN: 45 mg/dL — ABNORMAL HIGH (ref 6–20)
CHLORIDE: 100 mmol/L — AB (ref 101–111)
CO2: 28 mmol/L (ref 22–32)
CREATININE: 2.41 mg/dL — AB (ref 0.61–1.24)
Calcium: 8.7 mg/dL — ABNORMAL LOW (ref 8.9–10.3)
GFR calc non Af Amer: 24 mL/min — ABNORMAL LOW (ref >60)
GFR, EST AFRICAN AMERICAN: 27 mL/min — AB (ref >60)
Glucose, Bld: 126 mg/dL — ABNORMAL HIGH (ref 65–99)
POTASSIUM: 3.5 mmol/L (ref 3.5–5.1)
SODIUM: 136 mmol/L (ref 135–145)

## 2017-06-19 MED ORDER — HYDRALAZINE HCL 50 MG PO TABS: 50.0000 mg | ORAL_TABLET | Freq: Three times a day (TID) | ORAL | 6 refills | Status: DC

## 2017-06-19 NOTE — Patient Instructions (Addendum)
INCREASE Hydralazine to 50 mg three times daily. Can "double up" on current 25 mg tablets you have at home (Take 2 tabs three times daily). New Rx has been sent to your pharmacy for 50 mg tablets (Take 1 tab three times daily).  Take Torsemide 100 mg (5 tabs) TWICE daily today and tomorrow. May take extra 100 mg (5 tabs) one additional time only if needed before your appointment next week with Dr. Hollie Salk.  Follow up with Oda Kilts PA-C in 4 weeks.  _________________________________________________________ Marin Roberts Code: 4944  Take all medication as prescribed the day of your appointment. Bring all medications with you to your appointment.  Do the following things EVERYDAY: 1) Weigh yourself in the morning before breakfast. Write it down and keep it in a log. 2) Take your medicines as prescribed 3) Eat low salt foods-Limit salt (sodium) to 2000 mg per day.  4) Stay as active as you can everyday 5) Limit all fluids for the day to less than 2 liters

## 2017-06-19 NOTE — Progress Notes (Signed)
EPIC Encounter for ICM Monitoring  Patient Name: WINSLOW EDERER is a 83 y.o. male Date: 06/19/2017 Primary Care Physican: [name], MD Primary Cardiologist:Nahser/Bensimhon Electrophysiologist: Caryl Comes Nephrologist: Hollie Salk at [facility] Weight:  210.4lbs (baseline is 206 lbs) Bi-V Pacing: 89.6% (dropped from 92.3%on 03/31/2017)       Heart Failure questions reviewed, pt has swelling    Thoracic impedance abnormal suggesting fluid accumulation.  Prescribed dosage: Torsemide 20 mg 5tablets (100 mg total)daily. Metolazone 5 mg take 1 pill as directed by HF clinic if weight goes up 3 lbs overnight, or 5 lbs within one week, call before taking.  Labs: 06/12/2017 Creatinine 2.44, BUN 40,   Potassium 3.6, Sodium 135, EGFR 23-27 06/05/2017 Creatinine 2.23, BUN 53,   Potassium 3.7, Sodium 139, EGFR 26-30 05/13/2017 Creatinine 2.43, BUN 56, Potassium 3.8, Sodium 136, EGFR 23-27 05/06/2017 Creatinine 2.83, BUN 90, Potassium 3.7, Sodium 140, EGFR 19-23 05/05/2017 Creatinine 2.82, BUN 99, Potassium 3.5, Sodium 137, EGFR 20-23 05/04/2017 Creatinine 3.00, BUN 104, Potassium 3.3, Sodium 137, EGFR 18-21  05/03/2017 Creatinine 3.17, BUN 109, Potassium 3.1, Sodium 136, EGFR 17-20  05/02/2017 Creatinine 3.32, BUN 108, Potassium 3.8, Sodium 134, EGFR 16-19  05/01/2017 Creatinine 3.15, BUN 103, Potassium 3.2, Sodium 131, EGFR 17-21  04/28/2017 Creatinine 2.96, BUN 90, Potassium 3.3, Sodium 138, EGFR 19-22  03/04/2017 Creatinine 2.39, BUN 67, Potassium 3.4, Sodium 137  01/08/2017 Creatinine 2.98, BUN 82, Potassium 3.0, Sodium 136, EGFR 18-21 12/10/2016 Creatinine 2.66, BUN 70, Potassium 3.5, Sodium 138, EGFR 21-24 12/03/2016 Creatinine 2.70, BUN 72, Potassium 3.5, Sodium 138, EGFR 21-24 11/04/2016 Creatinine 3.27, BUN 79, Potassium 3.9, Sodium 137, EGFR 16-19 10/18/2016 Creatinine 2.7, BUN 63. Potassium 3.6, Sodium 138  Recommendations: Patient reported he is on the way to see  Dr Haroldine Laws as we were speaking.   Follow-up plan: ICM clinic phone appointment on 07/03/2017.    Copy of ICM check sent to Dr. Haroldine Laws and Dr. Caryl Comes.   3 month ICM trend: 06/19/2017    1 Year ICM trend:       Rosalene Billings, RN 06/19/2017 2:39 PM

## 2017-06-19 NOTE — Progress Notes (Signed)
Patient ID: Shane [name], male   DOB: 1934-07-22, 83 y.o.   MRN: [id number]    Advanced Heart Failure Clinic Note   Date:  06/19/2017   ID:  Shane [name], DOB 06-17-1934, MRN [id number]  PCP:  [name], MD  Cardiologist:   Nahser  Nephrologist: Hollie Salk  History of Present Illness: Shane [name] is a 83 y.o. male with CAD s/p CABG, systolic HF EF 62-83% and CKD 4 referred to the HF Clinic by Dr. Acie Fredrickson.   He is retired from [employer]. Had been doing fine until March or April 2018 when he started having more DOE. Felt he couldn't breath as deeply. MDT ICD was optimized. Took him off Toprol and switched to carvedilol. Also started Entresto. Began to gain fluid. Then lasix was started. (this was new). Entresto stopped. Creatine went from 1.9 to 3.3 then back to to 2.7.   Admitted 05/01/17-05/06/17 with cardiorenal syndrome. Discussed with Dr. Hollie Salk (His nephrologist) who agreed on admission for IV lasix trial. (He does have a patent fistula in place for initiation of HD once required). Pt initially had poor response to IV lasix and Renal consulted with plans for HD initiation. However on day 2, he began to respond to high-dose lasix with increased urine output and decreasing BUN/Cr. BP active meds cut back to allow more room for renal perfusion. Overall he diuresed 9.6 L and down 12 lbs on lasix 120 mg IV BID. Pt examined am of 05/06/17. Discharge weight was 206 pounds.   He presents today for follow up. At last visit, was markedly volume overloaded. Torsemide increased. Discussed fact that he was nearing dialysis with Dr. Hollie Salk. He has not had renal follow up yet, but sees next week.  Weight was down to 204 at home after extra torsemide, and felt very comfortable. Weight has trended back up gradually and now SOB with ADLs again.  + Orthopnea. BP elevated in clinic today, rushed from  [place] to get here on time.   Optivol: Sent from home today. Personally reviewed. Thoracic  impedence depressed. Fluid index rising steadily.   Past Medical History:  Diagnosis Date  . Abnormal nuclear cardiac imaging test Nov 2010   moderate area of infarct in the inferior wall with only minimal reversibility and EF of 28%  . AICD (automatic cardioverter/defibrillator) present   . Allergic rhinitis 01-08-13   Uses nebulizer for chronic sinus issues and Mucinex.  . Arthritis    osteoarthritis   . Asthma    Extrinic  . Blood transfusion    ? at time of bypass surgery   . BPH (benign prostatic hyperplasia)   . Cellulitis of arm, left    MSSA  . CHF (congestive heart failure) ([place])   . CKD (chronic kidney disease), stage III ([place])    "was told due to meds he takes"-no Renal workup done  . Colon polyps    adenomatous  . Coronary artery disease    remote CABG in 1985, cath in 2003 by Dr. Lia Foyer with no PCI, last nuclear in 2010 showing scar and EF of 28%.   . Diabetes mellitus   . Diabetic neuropathy ([place]) 04/25/2017  . Dyslipidemia   . Dyspnea   . Gait abnormality 04/25/2017  . Glaucoma 01-08-13   tx. eye drops  . HOH (hard of hearing)   . HTN (hypertension)   . Hypercholesterolemia   . Left ventricular dysfunction    28% per nuclear in 2010 and 35 to 40%  per echo in 2010  . Myocardial infarction ([place])    1985  . Neuromuscular disorder ([place])    legs mild paralysis-able to walk"nerve damage"-legs- left leg brace   . Neuropathy   . Pneumonia    hx of several times years ago   . Spinal stenosis     Past Surgical History:  Procedure Laterality Date  . A/V FISTULAGRAM Right 10/17/2016   Procedure: A/V Fistulagram;  Surgeon: Conrad Oil Trough, MD;  Location: [facility];  Service: Cardiovascular;  Laterality: Right;  . BACK SURGERY     hx of back surgery x 4   . BASCILIC VEIN TRANSPOSITION Right 09/09/2016   Procedure: BASCILIC VEIN TRANSPOSITION Right Arm;  Surgeon: Rosetta Posner, MD;  Location: [facility];  Service: Vascular;  Laterality: Right;  . BI-VENTRICULAR  IMPLANTABLE CARDIOVERTER DEFIBRILLATOR N/A 10/10/2014   Procedure: BI-VENTRICULAR IMPLANTABLE CARDIOVERTER DEFIBRILLATOR  (CRT-D);  Surgeon: [name], MD;  Location: [facility] CATH LAB;  Service: Cardiovascular;  Laterality: N/A;  . CARDIAC CATHETERIZATION  2003  . Cataract      recent cataract surgery 6'14  . COLONOSCOPY N/A 01/25/2013   Procedure: COLONOSCOPY;  Surgeon: Irene Shipper, MD;  Location: [facility];  Service: Endoscopy;  Laterality: N/A;  . CORONARY ARTERY BYPASS GRAFT  1985   x 5 vessels  . LUMBAR DISC SURGERY    . PERIPHERAL VASCULAR BALLOON ANGIOPLASTY Right 10/17/2016   Procedure: Peripheral Vascular Balloon Angioplasty;  Surgeon: Conrad Sabana, MD;  Location: [facility];  Service: Cardiovascular;  Laterality: Right;  ARM VEINOUS AND CENTRAL VEIN  . PR POLYSOM 6/>YRS SLEEP 4/> ADDL PARAM ATTND  12/07/2015  . PROSTATE SURGERY    . TOTAL KNEE ARTHROPLASTY  08/01/2011   Procedure: TOTAL KNEE ARTHROPLASTY;  Surgeon: Johnn Hai;  Location: [facility] ORS;  Service: Orthopedics;  Laterality: Right;    Current Outpatient Medications  Medication Sig Dispense Refill  . acetaminophen (TYLENOL) 500 MG tablet Take 1,000 mg by mouth every 8 (eight) hours as needed (pain).     Marland Kitchen albuterol (PROVENTIL HFA;VENTOLIN HFA) 108 (90 Base) MCG/ACT inhaler Inhale 2 puffs into the lungs every 4 (four) hours as needed for wheezing or shortness of breath (cough, shortness of breath or wheezing.). 1 Inhaler 1  . amLODipine (NORVASC) 5 MG tablet Take 1 tablet (5 mg total) by mouth daily. 30 tablet 6  . aspirin EC 81 MG tablet Take 81 mg by mouth daily.     Marland Kitchen atorvastatin (LIPITOR) 10 MG tablet Take 1 tablet (10 mg total) by mouth daily. 90 tablet 3  . carvedilol (COREG) 6.25 MG tablet Take 1 tablet (6.25 mg total) by mouth 2 (two) times daily with a meal. 60 tablet 5  . cetirizine (ZYRTEC) 10 MG tablet Take 10 mg by mouth at bedtime as needed (seasonal allergies).     . fluocinonide cream (LIDEX) 2.02  % Apply 1 application topically 2 (two) times daily as needed (for itchy rash).     . gabapentin (NEURONTIN) 300 MG capsule Take 300 mg by mouth at bedtime.    . hydrALAZINE (APRESOLINE) 25 MG tablet Take 1 tablet (25 mg total) by mouth 3 (three) times daily. 90 tablet 5  . metolazone (ZAROXOLYN) 5 MG tablet Take 1 pill as directed by HF clinic if weight goes up 3 lbs overnight, or 5 lbs within one week. Call before taking. 15 tablet 3  . omalizumab (XOLAIR) 150 MG injection Inject 300 mg into the  skin every 14 (fourteen) days. Last injection given 04/25/17    . potassium chloride (MICRO-K) 10 MEQ CR capsule Take 10 mEq by mouth daily as needed (with each metolazone dose).     . tadalafil (CIALIS) 20 MG tablet Take 20 mg by mouth daily as needed for erectile dysfunction. Do not exceed 2 doses in 7 days    . testosterone cypionate (DEPOTESTOTERONE CYPIONATE) 100 MG/ML injection Inject 400 mg into the muscle every 21 ( twenty-one) days. For IM use only    . torsemide (DEMADEX) 20 MG tablet Take 5 tablets (100 mg total) by mouth daily. 150 tablet 6  . triamcinolone (NASACORT AQ) 55 MCG/ACT AERO nasal inhaler Place 2 sprays into the nose daily as needed (seasonal allergies).     . zolpidem (AMBIEN) 10 MG tablet TAKE 1/2 TO 1 (ONE-HALF TO ONE) TABLET BY MOUTH AT BEDTIME AS NEEDED FOR SLEEP 30 tablet 1   Current Facility-Administered Medications  Medication Dose Route Frequency Provider Last Rate Last Dose  . omalizumab Arvid Right) injection 300 mg  300 mg Subcutaneous Q14 Days Javier Glazier, MD   300 mg at 04/25/17 1213  . omalizumab Arvid Right) injection 300 mg  300 mg Subcutaneous Q14 Days Javier Glazier, MD   300 mg at 05/12/17 0076   Allergies:   Codeine; Hydrocodone; Pseudoephedrine; and Azithromycin   Social History:  The patient  reports that he quit smoking about 58 years ago. His smoking use included cigarettes, pipe, and cigars. He has a 5.00 pack-year smoking history. he has never used  smokeless tobacco. He reports that he does not drink alcohol or use drugs.   Family History:  The patient's family history includes Bone cancer in his brother; Colon polyps in his father; Diabetes in his sister; Heart attack in his father; Heart disease in his brother and father; Lung cancer in his mother.   Review of systems complete and found to be negative unless listed in HPI.    PHYSICAL EXAM: Vitals:   06/19/17 1505  BP: (!) 168/80  Pulse: 97  SpO2: 95%  Weight: 216 lb 6.4 oz (98.2 kg)     Wt Readings from Last 3 Encounters:  06/19/17 216 lb 6.4 oz (98.2 kg)  06/05/17 219 lb (99.3 kg)  05/28/17 220 lb (99.8 kg)    Physical Exam General: Elderly appearing and obese. No resp difficulty. HEENT: Normal Neck: Supple. JVP 10-11 cm +. Carotids 2+ bilat; no bruits. No thyromegaly or nodule noted. Cor: PMI nondisplaced. RRR, 2/6 TR murmur Lungs: Mildly diminished basilar sounds.  Abdomen: Obese, soft, non-tender, non-distended, no HSM. No bruits or masses. +BS  Extremities: No cyanosis, clubbing, or rash. 1+ BLE edema. RUE AVF, matured.  Neuro: Alert & orientedx3, cranial nerves grossly intact. moves all 4 extremities w/o difficulty. Affect pleasant   ASSESSMENT AND PLAN: 1. Chronic systolic congestive heart failure: ICM. Echo EF 40-45% in 11/2015.  - NYHA IIIb symptoms.  - Volume status once again elevated on exam and optivol - Can repeat torsemide 100 mg BID x 2 days. Can take an additional day if needed, then go back to torsemide 100 mg daily as per our discussion with Dr. Hollie Salk during last visit.  BMET today.  - Continue Coreg 6.25 mg BID  2. Coronary artery disease: history of remote coronary artery bypass grafting 1985 - No s/s of ischemia.    - Last cath 2003, LM and RCA occluded. LIMA to LAD patent. RIMA to OM patent. SVG to PDA occluded.  3. CKD 4 - Baseline creatinine 2.4-2.8 - Follows with Dr. Hollie Salk. - Pt nearing dialysis, though still has some reserves and  responds well to increased diuretics. Will leave torsemide regimen to Nephrology with imminent possibility of needing dialysis.   4. Hyperlipidemia - Continue atorvastatin. No change.   5. Diabetes mellitus - Per PCP.   6. HTN - Increase hydralazine back to to 50 mg TID.   Re-accumulating volume after temporary torsemide increase. Follows up with Dr. Hollie Salk next week. Will forward todays note to her in preparation. Pt may tolerate an increased diuretic regimen for some time, but increase risk of requiring dialysis sooner. Discussed at length with patient at this visit. RTC 4 weeks.   Shirley Friar, PA-C  3:07 PM  Greater than 50% of the 25 minute visit was spent in counseling/coordination of care regarding disease state education, salt/fluid restriction, sliding scale diuretics, and medication compliance.

## 2017-06-20 ENCOUNTER — Ambulatory Visit (INDEPENDENT_AMBULATORY_CARE_PROVIDER_SITE_OTHER): Payer: Medicare Other

## 2017-06-20 DIAGNOSIS — M1712 Unilateral primary osteoarthritis, left knee: Secondary | ICD-10-CM | POA: Diagnosis not present

## 2017-06-20 DIAGNOSIS — J4540 Moderate persistent asthma, uncomplicated: Secondary | ICD-10-CM

## 2017-06-22 NOTE — Addendum Note (Signed)
Encounter addended by: Jolaine Artist, MD on: 06/22/2017 7:55 PM  Actions taken: Sign clinical note, LOS modified

## 2017-06-23 MED ORDER — OMALIZUMAB 150 MG ~~LOC~~ SOLR
300.0000 mg | SUBCUTANEOUS | Status: DC
Start: 2017-06-20 — End: 2017-09-08
  Administered 2017-06-20: 300 mg via SUBCUTANEOUS

## 2017-06-23 NOTE — Progress Notes (Signed)
Documentation of medication administration and charges of Xolair have been completed by Desmond Dike, CMA based on the Xolair documentation sheet completed by Maury Dus, RMA.

## 2017-06-25 DIAGNOSIS — D631 Anemia in chronic kidney disease: Secondary | ICD-10-CM | POA: Diagnosis not present

## 2017-06-25 DIAGNOSIS — I129 Hypertensive chronic kidney disease with stage 1 through stage 4 chronic kidney disease, or unspecified chronic kidney disease: Secondary | ICD-10-CM | POA: Diagnosis not present

## 2017-06-25 DIAGNOSIS — N184 Chronic kidney disease, stage 4 (severe): Secondary | ICD-10-CM | POA: Diagnosis not present

## 2017-06-25 DIAGNOSIS — I770 Arteriovenous fistula, acquired: Secondary | ICD-10-CM | POA: Diagnosis not present

## 2017-06-25 DIAGNOSIS — I5022 Chronic systolic (congestive) heart failure: Secondary | ICD-10-CM | POA: Diagnosis not present

## 2017-07-03 ENCOUNTER — Ambulatory Visit (INDEPENDENT_AMBULATORY_CARE_PROVIDER_SITE_OTHER): Payer: Self-pay

## 2017-07-03 DIAGNOSIS — Z95810 Presence of automatic (implantable) cardiac defibrillator: Secondary | ICD-10-CM

## 2017-07-03 DIAGNOSIS — I5042 Chronic combined systolic (congestive) and diastolic (congestive) heart failure: Secondary | ICD-10-CM

## 2017-07-04 ENCOUNTER — Ambulatory Visit (INDEPENDENT_AMBULATORY_CARE_PROVIDER_SITE_OTHER): Payer: Medicare Other

## 2017-07-04 DIAGNOSIS — J4540 Moderate persistent asthma, uncomplicated: Secondary | ICD-10-CM

## 2017-07-04 DIAGNOSIS — D631 Anemia in chronic kidney disease: Secondary | ICD-10-CM | POA: Diagnosis not present

## 2017-07-04 DIAGNOSIS — N184 Chronic kidney disease, stage 4 (severe): Secondary | ICD-10-CM | POA: Diagnosis not present

## 2017-07-04 NOTE — Progress Notes (Signed)
EPIC Encounter for ICM Monitoring  Patient Name: Shane [name] is a 83 y.o. male Date: 07/04/2017 Primary Care Physican: [name], MD Primary Cardiologist:Nahser/Bensimhon Electrophysiologist: Caryl Comes Nephrologist: Hollie Salk at [facility] Weight: 204lbs (baseline is 206 lbs) Bi-V Pacing:86.2% (dropped from 92.3%on 03/31/2017)      Heart Failure questions reviewed, pt reported weight los of 6 lbs since taking extra diuretic that Dr Haroldine Laws and Dr Hollie Salk prescribed   Thoracic impedance returned to normal after Dr Haroldine Laws increased diuretic for a couple of days at the 06/19/17 OV.   Per Dr Bensimhon's 06/19/17 OV note: Patient follows with Dr. Hollie Salk. - Pt nearing dialysis.  Dr Haroldine Laws will leave torsemide regimen to Nephrology with imminent possibility of needing dialysis.  Prescribed dosage: Torsemide 20 mg 5tablets (100 mg total)daily. Metolazone 5 mg take 1 pill as directed by HF clinic if weight goes up 3 lbs overnight, or 5 lbs within one week, call before taking.  Labs: 06/12/2017 Creatinine 2.44, BUN 40,   Potassium 3.6, Sodium 135, EGFR 23-27 06/05/2017 Creatinine 2.23, BUN 53,   Potassium 3.7, Sodium 139, EGFR 26-30 05/13/2017 Creatinine 2.43, BUN 56, Potassium 3.8, Sodium 136, EGFR 23-27 05/06/2017 Creatinine 2.83, BUN 90, Potassium 3.7, Sodium 140, EGFR 19-23 05/05/2017 Creatinine 2.82, BUN 99, Potassium 3.5, Sodium 137, EGFR 20-23 05/04/2017 Creatinine 3.00, BUN 104, Potassium 3.3, Sodium 137, EGFR 18-21  05/03/2017 Creatinine 3.17, BUN 109, Potassium 3.1, Sodium 136, EGFR 17-20  05/02/2017 Creatinine 3.32, BUN 108, Potassium 3.8, Sodium 134, EGFR 16-19  05/01/2017 Creatinine 3.15, BUN 103, Potassium 3.2, Sodium 131, EGFR 17-21  04/28/2017 Creatinine 2.96, BUN 90, Potassium 3.3, Sodium 138, EGFR 19-22  03/04/2017 Creatinine 2.39, BUN 67, Potassium 3.4, Sodium 137  01/08/2017 Creatinine 2.98, BUN 82, Potassium 3.0, Sodium 136, EGFR  18-21 12/10/2016 Creatinine 2.66, BUN 70, Potassium 3.5, Sodium 138, EGFR 21-24 12/03/2016 Creatinine 2.70, BUN 72, Potassium 3.5, Sodium 138, EGFR 21-24 11/04/2016 Creatinine 3.27, BUN 79, Potassium 3.9, Sodium 137, EGFR   Recommendations: No changes.  Encouraged to call for fluid symptoms.  Follow-up plan: ICM clinic phone appointment on 08/07/2017.  Office appointment scheduled 07/24/2017 with Dr. Cathie Olden.  Copy of ICM check sent to Dr. Caryl Comes.   3 month ICM trend: 07/03/2017    1 Year ICM trend:       Rosalene Billings, RN 07/04/2017 12:50 PM

## 2017-07-07 MED ORDER — OMALIZUMAB 150 MG ~~LOC~~ SOLR
300.0000 mg | Freq: Once | SUBCUTANEOUS | Status: AC
  Administered 2017-07-04: 300 mg via SUBCUTANEOUS

## 2017-07-07 NOTE — Progress Notes (Signed)
xolair

## 2017-07-08 DIAGNOSIS — M1712 Unilateral primary osteoarthritis, left knee: Secondary | ICD-10-CM | POA: Diagnosis not present

## 2017-07-09 ENCOUNTER — Other Ambulatory Visit: Payer: Self-pay | Admitting: Internal Medicine

## 2017-07-09 DIAGNOSIS — F5101 Primary insomnia: Secondary | ICD-10-CM

## 2017-07-10 NOTE — Telephone Encounter (Signed)
[place] Controlled Substance Database checked. Last filled on 06/02/17

## 2017-07-11 ENCOUNTER — Telehealth: Payer: Self-pay | Admitting: *Deleted

## 2017-07-11 ENCOUNTER — Telehealth (HOSPITAL_COMMUNITY): Payer: Self-pay | Admitting: Vascular Surgery

## 2017-07-11 NOTE — Telephone Encounter (Signed)
#   vials:4 Ordered date:07/11/17 Shipping Date:07/14/17

## 2017-07-11 NOTE — Telephone Encounter (Signed)
Left pt message to move appt due to bad weather 12/10

## 2017-07-14 ENCOUNTER — Encounter (HOSPITAL_COMMUNITY): Payer: Medicare Other

## 2017-07-16 NOTE — Telephone Encounter (Signed)
#   Vials:4 Arrival Date:07/16/17 Lot #:[id number] Exp Date:10/2020

## 2017-07-18 ENCOUNTER — Ambulatory Visit (INDEPENDENT_AMBULATORY_CARE_PROVIDER_SITE_OTHER): Payer: Medicare Other

## 2017-07-18 ENCOUNTER — Encounter: Payer: Self-pay | Admitting: *Deleted

## 2017-07-18 DIAGNOSIS — J4540 Moderate persistent asthma, uncomplicated: Secondary | ICD-10-CM

## 2017-07-18 DIAGNOSIS — N184 Chronic kidney disease, stage 4 (severe): Secondary | ICD-10-CM | POA: Diagnosis not present

## 2017-07-20 NOTE — Progress Notes (Signed)
Subjective:    Patient ID: Shane [name], male    DOB: 10-31-1933, 83 y.o.   MRN: [id number]  HPI The patient is here for follow up.  Elevated uric acid:  He is taking uloric for elevated uric acid for the past few days.  He was started on it by nephrology.  He had his knee drained and it had uric acid in it.  Nephrology checked his uric acid level in his blood and it was elevated and started him on the medication.  He was having left knee pain and ankle pain.  He feels better.  He does need a prescription for the medication and wanted to make sure this is what he should be taking.  Diabetes: He is taking his medication daily as prescribed. He is compliant with a diabetic diet.  He has been eating less and has lost some weight.  He is not exercising regularly. He is up-to-date with an ophthalmology examination.   Hypertension, CHF: He is taking his medication daily. He is compliant with a low sodium diet.  He has lost some weight and feels better.  He wears his compression socks daily.  He is minimal leg swelling.  His shortness of breath has improved with the weight loss.  He does not feel fluid overloaded.  He denies chest pain, palpitations, and regular headaches. He is not exercising regularly.      Medications and allergies reviewed with patient and updated if appropriate.  Patient Active Problem List   Diagnosis Date Noted  . Fever 05/28/2017  . Acute on chronic diastolic heart failure ([place]) 05/01/2017  . Diabetic neuropathy ([place]) 04/25/2017  . Gait abnormality 04/25/2017  . Numbness and tingling of both feet 03/04/2017  . Peripheral neuropathy 03/04/2017  . Carotid artery disease ([place]) 01/09/2017  . Chronic combined systolic and diastolic CHF (congestive heart failure) ([place]) 07/01/2016  . Restrictive lung disease 01/22/2016  . Severe obstructive sleep apnea 12/17/2015  . Essential hypertension 11/16/2015  . 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 LBBB (left bundle branch block)  10/10/2014  . Syncope 10/06/2014  . Pain in thumb joint with movement of right hand 01/04/2014  . Sinusitis, chronic 07/16/2013  . Asthma, moderate persistent 07/08/2013  . Chronic infection of sinus 07/31/2012  . Benign prostatic hypertrophy without urinary obstruction 12/18/2011  . CAD s/p coronary arthery bypass graft   . BPH (benign prostatic hyperplasia)   . Chronic kidney disease (CKD), stage IV (severe) ([place])   . H/O Spinal stenosis   . DM (diabetes mellitus), type 2 with renal complications ([place]) 18/56/3149  . Benign hypertensive heart disease without heart failure 11/07/2010  . Allergic rhinitis 11/07/2010  . Osteoarthritis 11/07/2010  . Ischemic cardiomyopathy   . Hypercholesterolemia     Current Outpatient Medications on File Prior to Visit  Medication Sig Dispense Refill  . acetaminophen (TYLENOL) 500 MG tablet Take 1,000 mg by mouth every 8 (eight) hours as needed (pain).     Marland Kitchen albuterol (PROVENTIL HFA;VENTOLIN HFA) 108 (90 Base) MCG/ACT inhaler Inhale 2 puffs into the lungs every 4 (four) hours as needed for wheezing or shortness of breath (cough, shortness of breath or wheezing.). 1 Inhaler 1  . amLODipine (NORVASC) 5 MG tablet Take 1 tablet (5 mg total) by mouth daily. 30 tablet 6  . aspirin EC 81 MG tablet Take 81 mg by mouth daily.     Marland Kitchen atorvastatin (LIPITOR) 10 MG tablet Take 1 tablet (10 mg total) by mouth daily. 90 tablet 3  .  carvedilol (COREG) 6.25 MG tablet Take 1 tablet (6.25 mg total) by mouth 2 (two) times daily with a meal. 60 tablet 5  . cetirizine (ZYRTEC) 10 MG tablet Take 10 mg by mouth at bedtime as needed (seasonal allergies).     . fluocinonide cream (LIDEX) 0.86 % Apply 1 application topically 2 (two) times daily as needed (for itchy rash).     . gabapentin (NEURONTIN) 300 MG capsule Take 300 mg by mouth at bedtime.    . hydrALAZINE (APRESOLINE) 50 MG tablet Take 1 tablet (50 mg total) 3 (three) times daily by mouth. 90 tablet 6  . metolazone  (ZAROXOLYN) 5 MG tablet Take 1 pill as directed by HF clinic if weight goes up 3 lbs overnight, or 5 lbs within one week. Call before taking. 15 tablet 3  . omalizumab (XOLAIR) 150 MG injection Inject 300 mg into the skin every 14 (fourteen) days. Last injection given 04/25/17    . potassium chloride (MICRO-K) 10 MEQ CR capsule Take 10 mEq by mouth daily as needed (with each metolazone dose).     . tadalafil (CIALIS) 20 MG tablet Take 20 mg by mouth daily as needed for erectile dysfunction. Do not exceed 2 doses in 7 days    . testosterone cypionate (DEPOTESTOTERONE CYPIONATE) 100 MG/ML injection Inject 400 mg into the muscle every 21 ( twenty-one) days. For IM use only    . torsemide (DEMADEX) 20 MG tablet Take 5 tablets (100 mg total) by mouth daily. 150 tablet 6  . triamcinolone (NASACORT AQ) 55 MCG/ACT AERO nasal inhaler Place 2 sprays into the nose daily as needed (seasonal allergies).     . zolpidem (AMBIEN) 10 MG tablet TAKE 1/2 TO 1 (ONE-HALF TO ONE) TABLET BY MOUTH AT BEDTIME AS NEEDED FOR SLEEP 30 tablet 1   Current Facility-Administered Medications on File Prior to Visit  Medication Dose Route Frequency Provider Last Rate Last Dose  . omalizumab Arvid Right) injection 300 mg  300 mg Subcutaneous Q14 Days Javier Glazier, MD   300 mg at 04/25/17 1213  . omalizumab Arvid Right) injection 300 mg  300 mg Subcutaneous Q14 Days Javier Glazier, MD   300 mg at 05/12/17 0946  . omalizumab Arvid Right) injection 300 mg  300 mg Subcutaneous Q14 Days Javier Glazier, MD   300 mg at 06/20/17 5784  . omalizumab Arvid Right) injection 300 mg  300 mg Subcutaneous Q14 Days Parrett, Tammy S, NP   300 mg at 07/18/17 1358    Past Medical History:  Diagnosis Date  . Abnormal nuclear cardiac imaging test Nov 2010   moderate area of infarct in the inferior wall with only minimal reversibility and EF of 28%  . AICD (automatic cardioverter/defibrillator) present   . Allergic rhinitis 01-08-13   Uses nebulizer for  chronic sinus issues and Mucinex.  . Arthritis    osteoarthritis   . Asthma    Extrinic  . Blood transfusion    ? at time of bypass surgery   . BPH (benign prostatic hyperplasia)   . Cellulitis of arm, left    MSSA  . CHF (congestive heart failure) ([place])   . CKD (chronic kidney disease), stage III ([place])    "was told due to meds he takes"-no Renal workup done  . Colon polyps    adenomatous  . Coronary artery disease    remote CABG in 1985, cath in 2003 by Dr. Lia Foyer with no PCI, last nuclear in 2010 showing scar and EF of 28%.   Marland Kitchen  Diabetes mellitus   . Diabetic neuropathy ([place]) 04/25/2017  . Dyslipidemia   . Dyspnea   . Gait abnormality 04/25/2017  . Glaucoma 01-08-13   tx. eye drops  . HOH (hard of hearing)   . HTN (hypertension)   . Hypercholesterolemia   . Left ventricular dysfunction    28% per nuclear in 2010 and 35 to 40% per echo in 2010  . Myocardial infarction ([place])    1985  . Neuromuscular disorder ([place])    legs mild paralysis-able to walk"nerve damage"-legs- left leg brace   . Neuropathy   . Pneumonia    hx of several times years ago   . Spinal stenosis     Past Surgical History:  Procedure Laterality Date  . A/V FISTULAGRAM Right 10/17/2016   Procedure: A/V Fistulagram;  Surgeon: Conrad Sandy Hook, MD;  Location: [facility];  Service: Cardiovascular;  Laterality: Right;  . BACK SURGERY     hx of back surgery x 4   . BASCILIC VEIN TRANSPOSITION Right 09/09/2016   Procedure: BASCILIC VEIN TRANSPOSITION Right Arm;  Surgeon: Rosetta Posner, MD;  Location: [facility] OR;  Service: Vascular;  Laterality: Right;  . BI-VENTRICULAR IMPLANTABLE CARDIOVERTER DEFIBRILLATOR N/A 10/10/2014   Procedure: BI-VENTRICULAR IMPLANTABLE CARDIOVERTER DEFIBRILLATOR  (CRT-D);  Surgeon: [name], MD;  Location: [facility] CATH LAB;  Service: Cardiovascular;  Laterality: N/A;  . CARDIAC CATHETERIZATION  2003  . Cataract      recent cataract surgery 6'14  . COLONOSCOPY N/A 01/25/2013   Procedure:  COLONOSCOPY;  Surgeon: Irene Shipper, MD;  Location: [facility];  Service: Endoscopy;  Laterality: N/A;  . CORONARY ARTERY BYPASS GRAFT  1985   x 5 vessels  . LUMBAR DISC SURGERY    . PERIPHERAL VASCULAR BALLOON ANGIOPLASTY Right 10/17/2016   Procedure: Peripheral Vascular Balloon Angioplasty;  Surgeon: Conrad Diller, MD;  Location: [facility];  Service: Cardiovascular;  Laterality: Right;  ARM VEINOUS AND CENTRAL VEIN  . PR POLYSOM 6/>YRS SLEEP 4/> ADDL PARAM ATTND  12/07/2015  . PROSTATE SURGERY    . TOTAL KNEE ARTHROPLASTY  08/01/2011   Procedure: TOTAL KNEE ARTHROPLASTY;  Surgeon: Johnn Hai;  Location: [facility];  Service: Orthopedics;  Laterality: Right;    Social History   Socioeconomic History  . Marital status: Married    Spouse name: None  . Number of children: 2  . Years of education: None  . Highest education level: None  Social Needs  . Financial resource strain: None  . Food insecurity - worry: None  . Food insecurity - inability: None  . Transportation needs - medical: None  . Transportation needs - non-medical: None  Occupational History  . Occupation: pastor  Tobacco Use  . Smoking status: Former Smoker    Packs/day: 1.00    Years: 5.00    Pack years: 5.00    Types: Cigarettes, Pipe, Cigars    Last attempt to quit: 08/05/1958    Years since quitting: 59.0  . Smokeless tobacco: Never Used  Substance and Sexual Activity  . Alcohol use: No  . Drug use: No  . Sexual activity: Not Currently  Other Topics Concern  . None  Social History Narrative   Originally from [place]. He has always lived in [place]. Prior travel to [place], [place], [place], [place] ,[place], & [place]. 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 Has adopted children. Has a dog currently. No bird exposure. No mold exposure. Enjoys reading & traveling.  Family History  Problem Relation Age of Onset  . Heart attack Father   . Heart disease Father   . Colon polyps Father   .  Lung cancer Mother   . Diabetes Sister        x 2  . Heart disease Brother        x 2  . Bone cancer Brother   . Lung disease Neg Hx     Review of Systems  Constitutional: Negative for chills and fever.  Respiratory: Positive for shortness of breath (improved). Negative for cough and wheezing.   Cardiovascular: Positive for leg swelling (mild, wears compression hose). Negative for chest pain and palpitations.  Neurological: Negative for light-headedness and headaches.       Objective:   Vitals:   07/21/17 1616  BP: 136/68  Pulse: 79  Resp: 16  Temp: 97.9 F (36.6 C)  SpO2: 96%   Wt Readings from Last 3 Encounters:  07/21/17 206 lb (93.4 kg)  06/19/17 216 lb 6.4 oz (98.2 kg)  06/05/17 219 lb (99.3 kg)   Body mass index is 29.56 kg/m.   Physical Exam    Constitutional: Appears well-developed and well-nourished. No distress.  HENT:  Head: Normocephalic and atraumatic.  Neck: Neck supple. No tracheal deviation present. No thyromegaly present.  No cervical lymphadenopathy Cardiovascular: Normal rate, regular rhythm and normal heart sounds.   3/6 systolic murmur heard. No carotid bruit .  No edema Pulmonary/Chest: Effort normal and breath sounds normal. No respiratory distress. No has no wheezes. No rales.  Skin: Skin is warm and dry. Not diaphoretic.  Psychiatric: Normal mood and affect. Behavior is normal.      Assessment & Plan:    See Problem List for Assessment and Plan of chronic medical problems.

## 2017-07-21 ENCOUNTER — Encounter: Payer: Self-pay | Admitting: Internal Medicine

## 2017-07-21 ENCOUNTER — Ambulatory Visit (INDEPENDENT_AMBULATORY_CARE_PROVIDER_SITE_OTHER): Payer: Medicare Other | Admitting: Internal Medicine

## 2017-07-21 VITALS — BP 136/68 | HR 79 | Temp 97.9°F | Resp 16 | Wt 206.0 lb

## 2017-07-21 DIAGNOSIS — E1121 Type 2 diabetes mellitus with diabetic nephropathy: Secondary | ICD-10-CM

## 2017-07-21 DIAGNOSIS — M1A0620 Idiopathic chronic gout, left knee, without tophus (tophi): Secondary | ICD-10-CM | POA: Diagnosis not present

## 2017-07-21 DIAGNOSIS — E1122 Type 2 diabetes mellitus with diabetic chronic kidney disease: Secondary | ICD-10-CM | POA: Diagnosis not present

## 2017-07-21 DIAGNOSIS — I255 Ischemic cardiomyopathy: Secondary | ICD-10-CM

## 2017-07-21 DIAGNOSIS — I10 Essential (primary) hypertension: Secondary | ICD-10-CM | POA: Diagnosis not present

## 2017-07-21 DIAGNOSIS — I5042 Chronic combined systolic (congestive) and diastolic (congestive) heart failure: Secondary | ICD-10-CM | POA: Diagnosis not present

## 2017-07-21 DIAGNOSIS — M109 Gout, unspecified: Secondary | ICD-10-CM | POA: Insufficient documentation

## 2017-07-21 LAB — POCT GLYCOSYLATED HEMOGLOBIN (HGB A1C): HEMOGLOBIN A1C: 6.7

## 2017-07-21 MED ORDER — OMALIZUMAB 150 MG ~~LOC~~ SOLR
300.0000 mg | SUBCUTANEOUS | Status: DC
  Administered 2017-07-18: 300 mg via SUBCUTANEOUS

## 2017-07-21 MED ORDER — FEBUXOSTAT 40 MG PO TABS: 40.0000 mg | ORAL_TABLET | Freq: Every day | ORAL | 0 refills | Status: DC

## 2017-07-21 MED ORDER — FEBUXOSTAT 40 MG PO TABS: 40.0000 mg | ORAL_TABLET | Freq: Every day | ORAL | 3 refills | Status: DC

## 2017-07-21 NOTE — Assessment & Plan Note (Signed)
Blood pressure currently controlled Continue current medication at current doses Kidney function stable-following with nephrology

## 2017-07-21 NOTE — Patient Instructions (Addendum)
Your a1c was checked.   No immunizations administered today.   Medications reviewed and updated.  No changes recommended at this time.  Your prescription(s) have been submitted to your pharmacy. Please take as directed and contact our office if you believe you are having problem(s) with the medication(s).    Please followup in 6 months

## 2017-07-21 NOTE — Assessment & Plan Note (Signed)
Has an appointment with CHF clinic Euvolemic on exam Encouraged him to continue weight loss efforts-this has improved the shortness of breath Continue compression socks

## 2017-07-21 NOTE — Progress Notes (Signed)
Documentation of medication administration and charges of Xolair have been completed by Rebbie Lauricella, CMA based on the Xolair documentation sheet completed by Tammy Scott.  

## 2017-07-21 NOTE — Assessment & Plan Note (Signed)
Has lost some weight [place] to decrease portions Compliant with a low sugar/carbohydrate diet Not currently exercising Encouraged him to continue weight loss efforts Check A1c today Follow-up in 6 months

## 2017-07-21 NOTE — Assessment & Plan Note (Signed)
Has had chronic pain in his left knee-drained by orthopedics and found to have elevated uric acid Uric acid level elevated and his blood, tested by nephrology Started on Uloric-we will continue 40 mg daily

## 2017-07-23 ENCOUNTER — Encounter (HOSPITAL_COMMUNITY): Payer: Medicare Other

## 2017-07-24 ENCOUNTER — Ambulatory Visit (HOSPITAL_COMMUNITY)
Admission: RE | Admit: 2017-07-24 | Discharge: 2017-07-24 | Disposition: A | Discharge status: Home / Self Care | Payer: Medicare Other | Source: Ambulatory Visit | Attending: Cardiology | Admitting: Cardiology

## 2017-07-24 ENCOUNTER — Ambulatory Visit: Payer: Medicare Other | Admitting: Cardiovascular Disease

## 2017-07-24 VITALS — BP 146/68 | HR 72 | Wt 204.0 lb

## 2017-07-24 DIAGNOSIS — I5022 Chronic systolic (congestive) heart failure: Secondary | ICD-10-CM | POA: Insufficient documentation

## 2017-07-24 DIAGNOSIS — Z87891 Personal history of nicotine dependence: Secondary | ICD-10-CM | POA: Diagnosis not present

## 2017-07-24 DIAGNOSIS — Z7982 Long term (current) use of aspirin: Secondary | ICD-10-CM | POA: Diagnosis not present

## 2017-07-24 DIAGNOSIS — J45909 Unspecified asthma, uncomplicated: Secondary | ICD-10-CM | POA: Insufficient documentation

## 2017-07-24 DIAGNOSIS — I2510 Atherosclerotic heart disease of native coronary artery without angina pectoris: Secondary | ICD-10-CM | POA: Diagnosis not present

## 2017-07-24 DIAGNOSIS — E1140 Type 2 diabetes mellitus with diabetic neuropathy, unspecified: Secondary | ICD-10-CM | POA: Insufficient documentation

## 2017-07-24 DIAGNOSIS — Z96651 Presence of right artificial knee joint: Secondary | ICD-10-CM | POA: Diagnosis not present

## 2017-07-24 DIAGNOSIS — E785 Hyperlipidemia, unspecified: Secondary | ICD-10-CM | POA: Diagnosis not present

## 2017-07-24 DIAGNOSIS — I252 Old myocardial infarction: Secondary | ICD-10-CM | POA: Diagnosis not present

## 2017-07-24 DIAGNOSIS — E1122 Type 2 diabetes mellitus with diabetic chronic kidney disease: Secondary | ICD-10-CM | POA: Diagnosis not present

## 2017-07-24 DIAGNOSIS — I10 Essential (primary) hypertension: Secondary | ICD-10-CM

## 2017-07-24 DIAGNOSIS — M1990 Unspecified osteoarthritis, unspecified site: Secondary | ICD-10-CM | POA: Insufficient documentation

## 2017-07-24 DIAGNOSIS — N400 Enlarged prostate without lower urinary tract symptoms: Secondary | ICD-10-CM | POA: Insufficient documentation

## 2017-07-24 DIAGNOSIS — I130 Hypertensive heart and chronic kidney disease with heart failure and stage 1 through stage 4 chronic kidney disease, or unspecified chronic kidney disease: Secondary | ICD-10-CM | POA: Diagnosis not present

## 2017-07-24 DIAGNOSIS — Z95810 Presence of automatic (implantable) cardiac defibrillator: Secondary | ICD-10-CM | POA: Insufficient documentation

## 2017-07-24 DIAGNOSIS — N184 Chronic kidney disease, stage 4 (severe): Secondary | ICD-10-CM | POA: Diagnosis not present

## 2017-07-24 DIAGNOSIS — Z79899 Other long term (current) drug therapy: Secondary | ICD-10-CM | POA: Diagnosis not present

## 2017-07-24 DIAGNOSIS — I5042 Chronic combined systolic (congestive) and diastolic (congestive) heart failure: Secondary | ICD-10-CM | POA: Diagnosis not present

## 2017-07-24 NOTE — Patient Instructions (Signed)
No changes to medication at this time.  Follow up 3 months with Dr. Haroldine Laws.  _____________________________________________________________ Marin Roberts Code:9002  Take all medication as prescribed the day of your appointment. Bring all medications with you to your appointment.  Do the following things EVERYDAY: 1) Weigh yourself in the morning before breakfast. Write it down and keep it in a log. 2) Take your medicines as prescribed 3) Eat low salt foods-Limit salt (sodium) to 2000 mg per day.  4) Stay as active as you can everyday 5) Limit all fluids for the day to less than 2 liters

## 2017-07-24 NOTE — Progress Notes (Signed)
Patient ID: Shane [name], male   DOB: 05-Dec-1933, 83 y.o.   MRN: [id number]    Advanced Heart Failure Clinic Note   Date:  07/24/2017   ID:  Shane [name], DOB 09-23-1933, MRN [id number]  PCP:  [name], MD  Cardiologist:   N[place]  Nephrologist: Hollie Salk  History of Present Illness: Shane [name] is a 83 y.o. male with CAD s/p CABG, systolic HF EF 46-56% and CKD 4 referred to the HF Clinic by Dr. Acie Fredrickson.   He is retired from [employer]. Had been doing fine until March or April 2018 when he started having more DOE. Felt he couldn't breath as deeply. MDT ICD was optimized. Took him off Toprol and switched to carvedilol. Also started Entresto. Began to gain fluid. Then lasix was started. (this was new). Entresto stopped. Creatine went from 1.9 to 3.3 then back to to 2.7.   Admitted 05/01/17-05/06/17 with cardiorenal syndrome. Discussed with Dr. Hollie Salk (His nephrologist) who agreed on admission for IV lasix trial. (He does have a patent fistula in place for initiation of HD once required). Pt initially had poor response to IV lasix and Renal consulted with plans for HD initiation. However on day 2, he began to respond to high-dose lasix with increased urine output and decreasing BUN/Cr. BP active meds cut back to allow more room for renal perfusion. Overall he diuresed 9.6 L and down 12 lbs on lasix 120 mg IV BID. Pt examined am of 05/06/17. Discharge weight was 206 pounds.   He returns for HF follow up. Last visit torsemide was increased to 100 mg twice a day for few days then back down to 100 mg daily. Overall feeling fine. Denies SOB/PND/Orthopnea. Appetite ok. No fever or chills. Weight at home has been less than 200 pounds.  Taking all medications. Plans to retire later this month and take care of his wife.   Optivol: Fluid indexbelow threshold. Activity ~3 hours per day. No AF/VT.   Past Medical History:  Diagnosis Date  . Abnormal nuclear cardiac imaging test Nov 2010   moderate area of infarct in the inferior wall with only minimal reversibility and EF of 28%  . AICD (automatic cardioverter/defibrillator) present   . Allergic rhinitis 01-08-13   Uses nebulizer for chronic sinus issues and Mucinex.  . Arthritis    osteoarthritis   . Asthma    Extrinic  . Blood transfusion    ? at time of bypass surgery   . BPH (benign prostatic hyperplasia)   . Cellulitis of arm, left    MSSA  . CHF (congestive heart failure) ([place])   . CKD (chronic kidney disease), stage III ([place])    "was told due to meds he takes"-no Renal workup done  . Colon polyps    adenomatous  . Coronary artery disease    remote CABG in 1985, cath in 2003 by Dr. Lia Foyer with no PCI, last nuclear in 2010 showing scar and EF of 28%.   . Diabetes mellitus   . Diabetic neuropathy ([place]) 04/25/2017  . Dyslipidemia   . Dyspnea   . Gait abnormality 04/25/2017  . Glaucoma 01-08-13   tx. eye drops  . HOH (hard of hearing)   . HTN (hypertension)   . Hypercholesterolemia   . Left ventricular dysfunction    28% per nuclear in 2010 and 35 to 40% per echo in 2010  . Myocardial infarction ([place])    1985  . Neuromuscular disorder ([facility])    legs  mild paralysis-able to walk"nerve damage"-legs- left leg brace   . Neuropathy   . Pneumonia    hx of several times years ago   . Spinal stenosis     Past Surgical History:  Procedure Laterality Date  . A/V FISTULAGRAM Right 10/17/2016   Procedure: A/V Fistulagram;  Surgeon: Conrad Laurens, MD;  Location: [facility];  Service: Cardiovascular;  Laterality: Right;  . BACK SURGERY     hx of back surgery x 4   . BASCILIC VEIN TRANSPOSITION Right 09/09/2016   Procedure: BASCILIC VEIN TRANSPOSITION Right Arm;  Surgeon: Rosetta Posner, MD;  Location[facility];  Service: Vascular;  Laterality: Right;  . BI-VENTRICULAR IMPLANTABLE CARDIOVERTER DEFIBRILLATOR N/A 10/10/2014   Procedure: BI-VENTRICULAR IMPLANTABLE CARDIOVERTER DEFIBRILLATOR  (CRT-D);  Surgeon: [name],  MD;  Location: [facility] CATH LAB;  Service: Cardiovascular;  Laterality: N/A;  . CARDIAC CATHETERIZATION  2003  . Cataract      recent cataract surgery 6'14  . COLONOSCOPY N/A 01/25/2013   Procedure: COLONOSCOPY;  Surgeon: Irene Shipper, MD;  Location: [facility];  Service: Endoscopy;  Laterality: N/A;  . CORONARY ARTERY BYPASS GRAFT  1985   x 5 vessels  . LUMBAR DISC SURGERY    . PERIPHERAL VASCULAR BALLOON ANGIOPLASTY Right 10/17/2016   Procedure: Peripheral Vascular Balloon Angioplasty;  Surgeon: Conrad [place]. Cloud, MD;  Location: [facility];  Service: Cardiovascular;  Laterality: Right;  ARM VEINOUS AND CENTRAL VEIN  . PR POLYSOM 6/>YRS SLEEP 4/> ADDL PARAM ATTND  12/07/2015  . PROSTATE SURGERY    . TOTAL KNEE ARTHROPLASTY  08/01/2011   Procedure: TOTAL KNEE ARTHROPLASTY;  Surgeon: Johnn Hai;  Location: [facility];  Service: Orthopedics;  Laterality: Right;    Current Outpatient Medications  Medication Sig Dispense Refill  . acetaminophen (TYLENOL) 500 MG tablet Take 1,000 mg by mouth every 8 (eight) hours as needed (pain).     Marland Kitchen albuterol (PROVENTIL HFA;VENTOLIN HFA) 108 (90 Base) MCG/ACT inhaler Inhale 2 puffs into the lungs every 4 (four) hours as needed for wheezing or shortness of breath (cough, shortness of breath or wheezing.). 1 Inhaler 1  . amLODipine (NORVASC) 5 MG tablet Take 1 tablet (5 mg total) by mouth daily. 30 tablet 6  . aspirin EC 81 MG tablet Take 81 mg by mouth daily.     Marland Kitchen atorvastatin (LIPITOR) 10 MG tablet Take 1 tablet (10 mg total) by mouth daily. 90 tablet 3  . carvedilol (COREG) 6.25 MG tablet Take 1 tablet (6.25 mg total) by mouth 2 (two) times daily with a meal. 60 tablet 5  . cetirizine (ZYRTEC) 10 MG tablet Take 10 mg by mouth at bedtime as needed (seasonal allergies).     . febuxostat (ULORIC) 40 MG tablet Take 1 tablet (40 mg total) by mouth daily. 90 tablet 3  . fluocinonide cream (LIDEX) 3.55 % Apply 1 application topically 2 (two) times daily as needed  (for itchy rash).     . gabapentin (NEURONTIN) 300 MG capsule Take 300 mg by mouth at bedtime.    . hydrALAZINE (APRESOLINE) 50 MG tablet Take 1 tablet (50 mg total) 3 (three) times daily by mouth. 90 tablet 6  . metolazone (ZAROXOLYN) 5 MG tablet Take 1 pill as directed by HF clinic if weight goes up 3 lbs overnight, or 5 lbs within one week. Call before taking. 15 tablet 3  . omalizumab (XOLAIR) 150 MG injection Inject 300 mg into the skin every 14 (  fourteen) days. Last injection given 04/25/17    . potassium chloride (MICRO-K) 10 MEQ CR capsule Take 10 mEq by mouth daily as needed (with each metolazone dose).     . tadalafil (CIALIS) 20 MG tablet Take 20 mg by mouth daily as needed for erectile dysfunction. Do not exceed 2 doses in 7 days    . testosterone cypionate (DEPOTESTOTERONE CYPIONATE) 100 MG/ML injection Inject 400 mg into the muscle every 21 ( twenty-one) days. For IM use only    . torsemide (DEMADEX) 20 MG tablet Take 5 tablets (100 mg total) by mouth daily. 150 tablet 6  . triamcinolone (NASACORT AQ) 55 MCG/ACT AERO nasal inhaler Place 2 sprays into the nose daily as needed (seasonal allergies).     . zolpidem (AMBIEN) 10 MG tablet TAKE 1/2 TO 1 (ONE-HALF TO ONE) TABLET BY MOUTH AT BEDTIME AS NEEDED FOR SLEEP 30 tablet 1   Current Facility-Administered Medications  Medication Dose Route Frequency Provider Last Rate Last Dose  . omalizumab Arvid Right) injection 300 mg  300 mg Subcutaneous Q14 Days Javier Glazier, MD   300 mg at 04/25/17 1213  . omalizumab Arvid Right) injection 300 mg  300 mg Subcutaneous Q14 Days Javier Glazier, MD   300 mg at 05/12/17 0946  . omalizumab Arvid Right) injection 300 mg  300 mg Subcutaneous Q14 Days Javier Glazier, MD   300 mg at 06/20/17 2841  . omalizumab Arvid Right) injection 300 mg  300 mg Subcutaneous Q14 Days Parrett, Tammy S, NP   300 mg at 07/18/17 1358   Allergies:   Codeine; Hydrocodone; Pseudoephedrine; and Azithromycin   Social History:  The  patient  reports that he quit smoking about 59 years ago. His smoking use included cigarettes, pipe, and cigars. He has a 5.00 pack-year smoking history. he has never used smokeless tobacco. He reports that he does not drink alcohol or use drugs.   Family History:  The patient's family history includes Bone cancer in his brother; Colon polyps in his father; Diabetes in his sister; Heart attack in his father; Heart disease in his brother and father; Lung cancer in his mother.   Review of systems complete and found to be negative unless listed in HPI.    PHYSICAL EXAM: Vitals:   07/24/17 1034  BP: (!) 146/68  Pulse: 72  SpO2: 95%  Weight: 204 lb (92.5 kg)     Wt Readings from Last 3 Encounters:  07/24/17 204 lb (92.5 kg)  07/21/17 206 lb (93.4 kg)  06/19/17 216 lb 6.4 oz (98.2 kg)    Physical Exam General:  Well appearing. No resp difficulty. Walked in the clinic without difficulty.  HEENT: normal Neck: supple. no JVD. Carotids 2+ bilat; no bruits. No lymphadenopathy or thryomegaly appreciated. Cor: PMI nondisplaced. Regular rate & rhythm. No rubs, gallops. 2/6 murmur. Lungs: clear Abdomen: soft, nontender, nondistended. No hepatosplenomegaly. No bruits or masses. Good bowel sounds. Extremities: no cyanosis, clubbing, rash, edema RUE AVF Neuro: alert & orientedx3, cranial nerves grossly intact. moves all 4 extremities w/o difficulty. Affect pleasant     ASSESSMENT AND PLAN: 1. Chronic systolic congestive heart failure: ICM. Echo EF 40-45% in 11/2015.  - NYHA II-III.  Volume status stable. Continue torsemide 100 mg daily. - Continue Coreg 6.25 mg BID  2. Coronary artery disease: history of remote coronary artery bypass grafting 1985 - No s/s of ischemia.    - Last cath 2003, LM and RCA occluded. LIMA to LAD patent. RIMA to OM patent. SVG  to PDA occluded.   3. CKD 4 - Baseline creatinine 2.4-2.8 - Follows with Dr. Hollie Salk. - Pt nearing dialysis  4. Hyperlipidemia - Continue  atorvastatin. No change.   5. Diabetes mellitus - Per PCP.   6. HTN - Continue current regimen.   Follow up in 3 months.   Darrick Grinder, NP  10:42 AM

## 2017-08-01 ENCOUNTER — Ambulatory Visit (INDEPENDENT_AMBULATORY_CARE_PROVIDER_SITE_OTHER): Payer: Medicare Other

## 2017-08-01 DIAGNOSIS — J4540 Moderate persistent asthma, uncomplicated: Secondary | ICD-10-CM

## 2017-08-06 ENCOUNTER — Telehealth: Payer: Self-pay | Admitting: Pulmonary Disease

## 2017-08-06 NOTE — Telephone Encounter (Signed)
#   PFS:6 Ordered date:08/06/17 Shipping Date:08/06/17

## 2017-08-07 ENCOUNTER — Ambulatory Visit (INDEPENDENT_AMBULATORY_CARE_PROVIDER_SITE_OTHER): Payer: Medicare Other | Admitting: *Deleted

## 2017-08-07 DIAGNOSIS — Z95810 Presence of automatic (implantable) cardiac defibrillator: Secondary | ICD-10-CM

## 2017-08-07 DIAGNOSIS — I255 Ischemic cardiomyopathy: Secondary | ICD-10-CM | POA: Diagnosis not present

## 2017-08-07 DIAGNOSIS — E291 Testicular hypofunction: Secondary | ICD-10-CM | POA: Diagnosis not present

## 2017-08-07 DIAGNOSIS — I5042 Chronic combined systolic (congestive) and diastolic (congestive) heart failure: Secondary | ICD-10-CM

## 2017-08-07 NOTE — Progress Notes (Signed)
Remote ICD transmission.   

## 2017-08-07 NOTE — Telephone Encounter (Signed)
#   PFS:4 Arrival Date:08/07/17 Lot #:[id number] Exp Date:07/2018

## 2017-08-08 ENCOUNTER — Encounter: Payer: Self-pay | Admitting: Cardiology

## 2017-08-08 ENCOUNTER — Telehealth: Payer: Self-pay

## 2017-08-08 NOTE — Telephone Encounter (Signed)
Remote ICM transmission received.  Attempted call to patient and no answer   

## 2017-08-08 NOTE — Progress Notes (Signed)
EPIC Encounter for ICM Monitoring  Patient Name: Shane [name] is a 84 y.o. male Date: 08/08/2017 Primary Care Physican: [name], MD Primary Cardiologist:Nahser/Bensimhon Electrophysiologist: Caryl Comes Nephrologist: Hollie Salk at [facility] Weight: Previous weight 204lbs (baseline is 206 lbs) Bi-V Pacing:92.9%      Attempted call to patient and unable to reach.   Transmission reviewed.    Thoracic impedance normal but was abnormal suggesting fluid accumulation starting downward trend.  Prescribed dosage: Torsemide 20 mg 5tablets (100 mg total)daily. Metolazone 5 mg take 1 pill as directed by HF clinic if weight goes up 3 lbs overnight, or 5 lbs within one week, call before taking.  Labs: 06/12/2017 Creatinine 2.44, BUN 40, Potassium 3.6, Sodium 135, EGFR 23-27 06/05/2017 Creatinine 2.23, BUN 53, Potassium 3.7, Sodium 139, EGFR 26-30 05/13/2017 Creatinine 2.43, BUN 56, Potassium 3.8, Sodium 136, EGFR 23-27 05/06/2017 Creatinine 2.83, BUN 90, Potassium 3.7, Sodium 140, EGFR 19-23 05/05/2017 Creatinine 2.82, BUN 99, Potassium 3.5, Sodium 137, EGFR 20-23 05/04/2017 Creatinine 3.00, BUN 104, Potassium 3.3, Sodium 137, EGFR 18-21  05/03/2017 Creatinine 3.17, BUN 109, Potassium 3.1, Sodium 136, EGFR 17-20  05/02/2017 Creatinine 3.32, BUN 108, Potassium 3.8, Sodium 134, EGFR 16-19  05/01/2017 Creatinine 3.15, BUN 103, Potassium 3.2, Sodium 131, EGFR 17-21  04/28/2017 Creatinine 2.96, BUN 90, Potassium 3.3, Sodium 138, EGFR 19-22  03/04/2017 Creatinine 2.39, BUN 67, Potassium 3.4, Sodium 137  01/08/2017 Creatinine 2.98, BUN 82, Potassium 3.0, Sodium 136, EGFR 18-21 12/10/2016 Creatinine 2.66, BUN 70, Potassium 3.5, Sodium 138, EGFR 21-24 12/03/2016 Creatinine 2.70, BUN 72, Potassium 3.5, Sodium 138, EGFR 21-24 11/04/2016 Creatinine 3.27, BUN 79, Potassium 3.9, Sodium 137, EGFR   Recommendations: NONE - Unable to reach.  Follow-up plan: ICM clinic phone appointment  on 09/09/2017.    Copy of ICM check sent to Dr. Caryl Comes and Dr Haroldine Laws.   3 month ICM trend: 08/08/2017    1 Year ICM trend:       Rosalene Billings, RN 08/08/2017 2:00 PM

## 2017-08-12 MED ORDER — OMALIZUMAB 150 MG ~~LOC~~ SOLR
300.0000 mg | Freq: Once | SUBCUTANEOUS | Status: AC
  Administered 2017-08-01: 300 mg via SUBCUTANEOUS

## 2017-08-14 ENCOUNTER — Other Ambulatory Visit (HOSPITAL_COMMUNITY): Payer: Self-pay | Admitting: Student

## 2017-08-14 ENCOUNTER — Other Ambulatory Visit: Payer: Self-pay | Admitting: Neurology

## 2017-08-14 LAB — CUP PACEART REMOTE DEVICE CHECK
Battery Remaining Longevity: 51 mo
Battery Voltage: 2.97 V
Brady Statistic AP VP Percent: 0.15 %
Brady Statistic AS VS Percent: 3.18 %
Date Time Interrogation Session: 20190103051803
HIGH POWER IMPEDANCE MEASURED VALUE: 61 Ohm
Implantable Lead Implant Date: 20160308
Implantable Lead Location: 753859
Implantable Lead Model: 4598
Implantable Lead Model: 6935
Implantable Pulse Generator Implant Date: 20160308
Lead Channel Impedance Value: 323 Ohm
Lead Channel Impedance Value: 342 Ohm
Lead Channel Impedance Value: 380 Ohm
Lead Channel Impedance Value: 399 Ohm
Lead Channel Impedance Value: 456 Ohm
Lead Channel Impedance Value: 570 Ohm
Lead Channel Impedance Value: 703 Ohm
Lead Channel Pacing Threshold Amplitude: 1 V
Lead Channel Pacing Threshold Amplitude: 2.375 V
Lead Channel Pacing Threshold Pulse Width: 0.6 ms
Lead Channel Sensing Intrinsic Amplitude: 4.25 mV
Lead Channel Setting Pacing Amplitude: 2 V
Lead Channel Setting Pacing Amplitude: 2.5 V
Lead Channel Setting Pacing Pulse Width: 0.8 ms
MDC IDC LEAD IMPLANT DT: 20160308
MDC IDC LEAD IMPLANT DT: 20160308
MDC IDC LEAD LOCATION: 753858
MDC IDC LEAD LOCATION: 753860
MDC IDC MSMT LEADCHNL LV IMPEDANCE VALUE: 323 Ohm
MDC IDC MSMT LEADCHNL LV IMPEDANCE VALUE: 399 Ohm
MDC IDC MSMT LEADCHNL LV IMPEDANCE VALUE: 551 Ohm
MDC IDC MSMT LEADCHNL LV IMPEDANCE VALUE: 646 Ohm
MDC IDC MSMT LEADCHNL LV IMPEDANCE VALUE: 703 Ohm
MDC IDC MSMT LEADCHNL RA IMPEDANCE VALUE: 380 Ohm
MDC IDC MSMT LEADCHNL RA PACING THRESHOLD AMPLITUDE: 0.625 V
MDC IDC MSMT LEADCHNL RA PACING THRESHOLD PULSEWIDTH: 0.4 ms
MDC IDC MSMT LEADCHNL RA SENSING INTR AMPL: 2.875 mV
MDC IDC MSMT LEADCHNL RA SENSING INTR AMPL: 2.875 mV
MDC IDC MSMT LEADCHNL RV PACING THRESHOLD PULSEWIDTH: 0.4 ms
MDC IDC MSMT LEADCHNL RV SENSING INTR AMPL: 4.25 mV
MDC IDC SET LEADCHNL LV PACING PULSEWIDTH: 0.6 ms
MDC IDC SET LEADCHNL RA PACING AMPLITUDE: 2 V
MDC IDC SET LEADCHNL RV SENSING SENSITIVITY: 0.3 mV
MDC IDC STAT BRADY AP VS PERCENT: 0.04 %
MDC IDC STAT BRADY AS VP PERCENT: 96.63 %
MDC IDC STAT BRADY RA PERCENT PACED: 0.19 %
MDC IDC STAT BRADY RV PERCENT PACED: 0.15 %

## 2017-08-15 ENCOUNTER — Telehealth: Payer: Self-pay

## 2017-08-15 ENCOUNTER — Ambulatory Visit: Payer: Medicare Other

## 2017-08-15 MED ORDER — POTASSIUM CHLORIDE ER 10 MEQ PO CPCR: 10.0000 meq | ORAL_CAPSULE | Freq: Every day | ORAL | 6 refills | Status: DC | PRN

## 2017-08-15 NOTE — Telephone Encounter (Signed)
Received voicemail from this patient with his name and birthday provided stating that he needed to "talk to Dr. Gearldine Shown nurse".   Returned call and patient informed that he has cancelled and rescheduled an appt for [employer]. This RN voiced understanding.

## 2017-08-18 ENCOUNTER — Telehealth: Payer: Self-pay

## 2017-08-18 NOTE — Telephone Encounter (Signed)
LVM for pt to call device clinic back. 

## 2017-08-18 NOTE — Telephone Encounter (Signed)
Spoke with pt regarding shock from 08/16/17@ 2305, pt recalls falling in the bathroom around that time, pt didn't recall any chest pain, or SOB, pt stated that he just got back up and went to bed.  pt stated that he had taken all his medications that day and aware of DMV driving restrictions informed pt that I would call back if there were any further recommendations. Pt voiced understanding.

## 2017-08-19 ENCOUNTER — Ambulatory Visit (INDEPENDENT_AMBULATORY_CARE_PROVIDER_SITE_OTHER): Payer: Medicare Other

## 2017-08-19 ENCOUNTER — Ambulatory Visit: Payer: Medicare Other | Admitting: Adult Health

## 2017-08-19 DIAGNOSIS — J4540 Moderate persistent asthma, uncomplicated: Secondary | ICD-10-CM

## 2017-08-20 ENCOUNTER — Encounter: Payer: Medicare Other | Admitting: Physician Assistant

## 2017-08-21 MED ORDER — OMALIZUMAB 150 MG ~~LOC~~ SOLR
300.0000 mg | SUBCUTANEOUS | Status: DC
  Administered 2017-08-19: 300 mg via SUBCUTANEOUS

## 2017-08-21 NOTE — Progress Notes (Signed)
Xolair injection documentation and charges entered by Ashley Caulfield, RMA, based on injection sheet filled out by Tammy Scott per office protocol.   

## 2017-08-22 ENCOUNTER — Emergency Department (HOSPITAL_COMMUNITY): Payer: Medicare Other

## 2017-08-22 ENCOUNTER — Encounter (HOSPITAL_COMMUNITY): Payer: Self-pay | Admitting: Emergency Medicine

## 2017-08-22 ENCOUNTER — Inpatient Hospital Stay (HOSPITAL_COMMUNITY)
Admission: EM | Admit: 2017-08-22 | Discharge: 2017-08-28 | DRG: 308 | Disposition: A | Discharge status: Home Health | Payer: Medicare Other | Attending: Nephrology | Admitting: Nephrology

## 2017-08-22 DIAGNOSIS — Z8249 Family history of ischemic heart disease and other diseases of the circulatory system: Secondary | ICD-10-CM

## 2017-08-22 DIAGNOSIS — I472 Ventricular tachycardia, unspecified: Secondary | ICD-10-CM

## 2017-08-22 DIAGNOSIS — J309 Allergic rhinitis, unspecified: Secondary | ICD-10-CM | POA: Diagnosis present

## 2017-08-22 DIAGNOSIS — G4733 Obstructive sleep apnea (adult) (pediatric): Secondary | ICD-10-CM | POA: Diagnosis present

## 2017-08-22 DIAGNOSIS — Z4502 Encounter for adjustment and management of automatic implantable cardiac defibrillator: Secondary | ICD-10-CM | POA: Diagnosis not present

## 2017-08-22 DIAGNOSIS — N184 Chronic kidney disease, stage 4 (severe): Secondary | ICD-10-CM | POA: Diagnosis present

## 2017-08-22 DIAGNOSIS — E876 Hypokalemia: Secondary | ICD-10-CM | POA: Diagnosis not present

## 2017-08-22 DIAGNOSIS — N400 Enlarged prostate without lower urinary tract symptoms: Secondary | ICD-10-CM | POA: Diagnosis present

## 2017-08-22 DIAGNOSIS — E7800 Pure hypercholesterolemia, unspecified: Secondary | ICD-10-CM | POA: Diagnosis present

## 2017-08-22 DIAGNOSIS — I130 Hypertensive heart and chronic kidney disease with heart failure and stage 1 through stage 4 chronic kidney disease, or unspecified chronic kidney disease: Secondary | ICD-10-CM | POA: Diagnosis present

## 2017-08-22 DIAGNOSIS — M1A9XX0 Chronic gout, unspecified, without tophus (tophi): Secondary | ICD-10-CM | POA: Diagnosis present

## 2017-08-22 DIAGNOSIS — R778 Other specified abnormalities of plasma proteins: Secondary | ICD-10-CM | POA: Diagnosis present

## 2017-08-22 DIAGNOSIS — I2510 Atherosclerotic heart disease of native coronary artery without angina pectoris: Secondary | ICD-10-CM | POA: Diagnosis present

## 2017-08-22 DIAGNOSIS — R55 Syncope and collapse: Secondary | ICD-10-CM | POA: Diagnosis present

## 2017-08-22 DIAGNOSIS — Z881 Allergy status to other antibiotic agents status: Secondary | ICD-10-CM

## 2017-08-22 DIAGNOSIS — T502X5A Adverse effect of carbonic-anhydrase inhibitors, benzothiadiazides and other diuretics, initial encounter: Secondary | ICD-10-CM | POA: Diagnosis not present

## 2017-08-22 DIAGNOSIS — E1140 Type 2 diabetes mellitus with diabetic neuropathy, unspecified: Secondary | ICD-10-CM | POA: Diagnosis present

## 2017-08-22 DIAGNOSIS — R059 Cough, unspecified: Secondary | ICD-10-CM

## 2017-08-22 DIAGNOSIS — Z885 Allergy status to narcotic agent status: Secondary | ICD-10-CM

## 2017-08-22 DIAGNOSIS — I5042 Chronic combined systolic (congestive) and diastolic (congestive) heart failure: Secondary | ICD-10-CM | POA: Diagnosis not present

## 2017-08-22 DIAGNOSIS — J9601 Acute respiratory failure with hypoxia: Secondary | ICD-10-CM | POA: Diagnosis present

## 2017-08-22 DIAGNOSIS — E785 Hyperlipidemia, unspecified: Secondary | ICD-10-CM | POA: Diagnosis present

## 2017-08-22 DIAGNOSIS — E663 Overweight: Secondary | ICD-10-CM | POA: Diagnosis present

## 2017-08-22 DIAGNOSIS — N179 Acute kidney failure, unspecified: Secondary | ICD-10-CM | POA: Diagnosis present

## 2017-08-22 DIAGNOSIS — R05 Cough: Secondary | ICD-10-CM

## 2017-08-22 DIAGNOSIS — H9190 Unspecified hearing loss, unspecified ear: Secondary | ICD-10-CM | POA: Diagnosis present

## 2017-08-22 DIAGNOSIS — R748 Abnormal levels of other serum enzymes: Secondary | ICD-10-CM | POA: Diagnosis present

## 2017-08-22 DIAGNOSIS — I252 Old myocardial infarction: Secondary | ICD-10-CM | POA: Diagnosis not present

## 2017-08-22 DIAGNOSIS — E1129 Type 2 diabetes mellitus with other diabetic kidney complication: Secondary | ICD-10-CM | POA: Diagnosis present

## 2017-08-22 DIAGNOSIS — I10 Essential (primary) hypertension: Secondary | ICD-10-CM | POA: Diagnosis not present

## 2017-08-22 DIAGNOSIS — I5022 Chronic systolic (congestive) heart failure: Secondary | ICD-10-CM | POA: Diagnosis present

## 2017-08-22 DIAGNOSIS — J4540 Moderate persistent asthma, uncomplicated: Secondary | ICD-10-CM | POA: Diagnosis present

## 2017-08-22 DIAGNOSIS — E1122 Type 2 diabetes mellitus with diabetic chronic kidney disease: Secondary | ICD-10-CM | POA: Diagnosis present

## 2017-08-22 DIAGNOSIS — I255 Ischemic cardiomyopathy: Secondary | ICD-10-CM | POA: Diagnosis not present

## 2017-08-22 DIAGNOSIS — Z87891 Personal history of nicotine dependence: Secondary | ICD-10-CM

## 2017-08-22 DIAGNOSIS — Z833 Family history of diabetes mellitus: Secondary | ICD-10-CM

## 2017-08-22 DIAGNOSIS — H409 Unspecified glaucoma: Secondary | ICD-10-CM | POA: Diagnosis present

## 2017-08-22 DIAGNOSIS — Z96651 Presence of right artificial knee joint: Secondary | ICD-10-CM | POA: Diagnosis present

## 2017-08-22 DIAGNOSIS — R7989 Other specified abnormal findings of blood chemistry: Secondary | ICD-10-CM

## 2017-08-22 DIAGNOSIS — I25119 Atherosclerotic heart disease of native coronary artery with unspecified angina pectoris: Secondary | ICD-10-CM | POA: Diagnosis not present

## 2017-08-22 DIAGNOSIS — Z888 Allergy status to other drugs, medicaments and biological substances status: Secondary | ICD-10-CM

## 2017-08-22 DIAGNOSIS — Z95810 Presence of automatic (implantable) cardiac defibrillator: Secondary | ICD-10-CM

## 2017-08-22 DIAGNOSIS — Z6829 Body mass index (BMI) 29.0-29.9, adult: Secondary | ICD-10-CM

## 2017-08-22 DIAGNOSIS — R079 Chest pain, unspecified: Secondary | ICD-10-CM | POA: Diagnosis not present

## 2017-08-22 DIAGNOSIS — I493 Ventricular premature depolarization: Secondary | ICD-10-CM | POA: Diagnosis present

## 2017-08-22 DIAGNOSIS — Z7982 Long term (current) use of aspirin: Secondary | ICD-10-CM

## 2017-08-22 DIAGNOSIS — Z951 Presence of aortocoronary bypass graft: Secondary | ICD-10-CM

## 2017-08-22 DIAGNOSIS — Z79899 Other long term (current) drug therapy: Secondary | ICD-10-CM

## 2017-08-22 LAB — BRAIN NATRIURETIC PEPTIDE: B Natriuretic Peptide: 918.3 pg/mL — ABNORMAL HIGH (ref 0.0–100.0)

## 2017-08-22 LAB — BASIC METABOLIC PANEL
Anion gap: 14 (ref 5–15)
BUN: 79 mg/dL — ABNORMAL HIGH (ref 6–20)
CALCIUM: 9 mg/dL (ref 8.9–10.3)
CO2: 27 mmol/L (ref 22–32)
CREATININE: 2.58 mg/dL — AB (ref 0.61–1.24)
Chloride: 94 mmol/L — ABNORMAL LOW (ref 101–111)
GFR, EST AFRICAN AMERICAN: 25 mL/min — AB (ref >60)
GFR, EST NON AFRICAN AMERICAN: 22 mL/min — AB (ref >60)
Glucose, Bld: 233 mg/dL — ABNORMAL HIGH (ref 65–99)
Potassium: 3.3 mmol/L — ABNORMAL LOW (ref 3.5–5.1)
SODIUM: 135 mmol/L (ref 135–145)

## 2017-08-22 LAB — COMPREHENSIVE METABOLIC PANEL
ALBUMIN: 3.3 g/dL — AB (ref 3.5–5.0)
ALK PHOS: 58 U/L (ref 38–126)
ALT: 17 U/L (ref 17–63)
ANION GAP: 13 (ref 5–15)
AST: 17 U/L (ref 15–41)
BUN: 86 mg/dL — ABNORMAL HIGH (ref 6–20)
CALCIUM: 9.1 mg/dL (ref 8.9–10.3)
CO2: 28 mmol/L (ref 22–32)
Chloride: 96 mmol/L — ABNORMAL LOW (ref 101–111)
Creatinine, Ser: 2.67 mg/dL — ABNORMAL HIGH (ref 0.61–1.24)
GFR calc Af Amer: 24 mL/min — ABNORMAL LOW (ref >60)
GFR calc non Af Amer: 21 mL/min — ABNORMAL LOW (ref >60)
GLUCOSE: 155 mg/dL — AB (ref 65–99)
Potassium: 2.6 mmol/L — CL (ref 3.5–5.1)
SODIUM: 137 mmol/L (ref 135–145)
Total Bilirubin: 1.1 mg/dL (ref 0.3–1.2)
Total Protein: 6.3 g/dL — ABNORMAL LOW (ref 6.5–8.1)

## 2017-08-22 LAB — CBC WITH DIFFERENTIAL/PLATELET
BASOS ABS: 0 10*3/uL (ref 0.0–0.1)
BASOS PCT: 0 %
EOS ABS: 0.2 10*3/uL (ref 0.0–0.7)
Eosinophils Relative: 2 %
HCT: 33.8 % — ABNORMAL LOW (ref 39.0–52.0)
Hemoglobin: 10.5 g/dL — ABNORMAL LOW (ref 13.0–17.0)
Lymphocytes Relative: 17 %
Lymphs Abs: 1.1 10*3/uL (ref 0.7–4.0)
MCH: 29 pg (ref 26.0–34.0)
MCHC: 31.1 g/dL (ref 30.0–36.0)
MCV: 93.4 fL (ref 78.0–100.0)
MONO ABS: 0.5 10*3/uL (ref 0.1–1.0)
MONOS PCT: 8 %
NEUTROS PCT: 73 %
Neutro Abs: 4.8 10*3/uL (ref 1.7–7.7)
Platelets: 116 10*3/uL — ABNORMAL LOW (ref 150–400)
RBC: 3.62 MIL/uL — ABNORMAL LOW (ref 4.22–5.81)
RDW: 16.2 % — AB (ref 11.5–15.5)
WBC: 6.7 10*3/uL (ref 4.0–10.5)

## 2017-08-22 LAB — MAGNESIUM: MAGNESIUM: 2.2 mg/dL (ref 1.7–2.4)

## 2017-08-22 LAB — CBG MONITORING, ED
GLUCOSE-CAPILLARY: 135 mg/dL — AB (ref 65–99)
Glucose-Capillary: 207 mg/dL — ABNORMAL HIGH (ref 65–99)

## 2017-08-22 LAB — TSH: TSH: 1.243 u[IU]/mL (ref 0.350–4.500)

## 2017-08-22 LAB — TROPONIN I: Troponin I: 0.07 ng/mL (ref <0.03)

## 2017-08-22 MED ORDER — INSULIN ASPART 100 UNIT/ML ~~LOC~~ SOLN
0.0000 [IU] | Freq: Three times a day (TID) | SUBCUTANEOUS | Status: DC
  Administered 2017-08-22 – 2017-08-23 (×2): 1 [IU] via SUBCUTANEOUS
  Administered 2017-08-23: 2 [IU] via SUBCUTANEOUS
  Administered 2017-08-23: 1 [IU] via SUBCUTANEOUS
  Administered 2017-08-24 (×2): 3 [IU] via SUBCUTANEOUS
  Administered 2017-08-24 – 2017-08-25 (×2): 2 [IU] via SUBCUTANEOUS
  Administered 2017-08-26 (×2): 1 [IU] via SUBCUTANEOUS
  Administered 2017-08-26: 3 [IU] via SUBCUTANEOUS
  Administered 2017-08-27: 2 [IU] via SUBCUTANEOUS
  Administered 2017-08-27 – 2017-08-28 (×3): 1 [IU] via SUBCUTANEOUS
  Filled 2017-08-22: qty 1

## 2017-08-22 MED ORDER — ACETAMINOPHEN 500 MG PO TABS: 1000.0000 mg | ORAL_TABLET | Freq: Three times a day (TID) | ORAL | Status: DC | PRN

## 2017-08-22 MED ORDER — SENNOSIDES-DOCUSATE SODIUM 8.6-50 MG PO TABS
1.0000 | ORAL_TABLET | Freq: Every evening | ORAL | Status: DC | PRN
  Administered 2017-08-23 – 2017-08-26 (×2): 1 via ORAL
  Filled 2017-08-22 (×2): qty 1

## 2017-08-22 MED ORDER — ALBUTEROL SULFATE (2.5 MG/3ML) 0.083% IN NEBU: 3.0000 mL | INHALATION_SOLUTION | RESPIRATORY_TRACT | Status: DC | PRN

## 2017-08-22 MED ORDER — ATORVASTATIN CALCIUM 10 MG PO TABS
10.0000 mg | ORAL_TABLET | Freq: Every day | ORAL | Status: DC
  Administered 2017-08-23 – 2017-08-28 (×6): 10 mg via ORAL
  Filled 2017-08-22 (×6): qty 1

## 2017-08-22 MED ORDER — ENOXAPARIN SODIUM 30 MG/0.3ML ~~LOC~~ SOLN
30.0000 mg | Freq: Every day | SUBCUTANEOUS | Status: DC
  Administered 2017-08-23 – 2017-08-28 (×6): 30 mg via SUBCUTANEOUS
  Filled 2017-08-22 (×6): qty 0.3

## 2017-08-22 MED ORDER — AMIODARONE LOAD VIA INFUSION
150.0000 mg | Freq: Once | INTRAVENOUS | Status: AC
  Administered 2017-08-22: 150 mg via INTRAVENOUS
  Filled 2017-08-22: qty 83.34

## 2017-08-22 MED ORDER — AMLODIPINE BESYLATE 5 MG PO TABS
5.0000 mg | ORAL_TABLET | Freq: Every day | ORAL | Status: DC
  Administered 2017-08-22 – 2017-08-28 (×7): 5 mg via ORAL
  Filled 2017-08-22 (×7): qty 1

## 2017-08-22 MED ORDER — TORSEMIDE 20 MG PO TABS
100.0000 mg | ORAL_TABLET | Freq: Every day | ORAL | Status: DC
  Administered 2017-08-23 – 2017-08-24 (×2): 100 mg via ORAL
  Filled 2017-08-22 (×2): qty 5

## 2017-08-22 MED ORDER — POTASSIUM CHLORIDE 10 MEQ/100ML IV SOLN
10.0000 meq | INTRAVENOUS | Status: DC
  Administered 2017-08-22 (×2): 10 meq via INTRAVENOUS
  Filled 2017-08-22 (×2): qty 100

## 2017-08-22 MED ORDER — ASPIRIN EC 81 MG PO TBEC
81.0000 mg | DELAYED_RELEASE_TABLET | Freq: Every day | ORAL | Status: DC
  Administered 2017-08-22 – 2017-08-28 (×7): 81 mg via ORAL
  Filled 2017-08-22 (×7): qty 1

## 2017-08-22 MED ORDER — GABAPENTIN 300 MG PO CAPS
300.0000 mg | ORAL_CAPSULE | Freq: Every day | ORAL | Status: DC
  Administered 2017-08-22 – 2017-08-27 (×6): 300 mg via ORAL
  Filled 2017-08-22 (×6): qty 1

## 2017-08-22 MED ORDER — LORATADINE 10 MG PO TABS
10.0000 mg | ORAL_TABLET | Freq: Every day | ORAL | Status: DC
  Administered 2017-08-22 – 2017-08-28 (×7): 10 mg via ORAL
  Filled 2017-08-22 (×7): qty 1

## 2017-08-22 MED ORDER — POTASSIUM CHLORIDE ER 10 MEQ PO CPCR: 10.0000 meq | ORAL_CAPSULE | Freq: Every day | ORAL | Status: DC | PRN

## 2017-08-22 MED ORDER — ONDANSETRON HCL 4 MG/2ML IJ SOLN
4.0000 mg | Freq: Four times a day (QID) | INTRAMUSCULAR | Status: DC | PRN
  Filled 2017-08-22: qty 2

## 2017-08-22 MED ORDER — INSULIN ASPART 100 UNIT/ML ~~LOC~~ SOLN
0.0000 [IU] | Freq: Every day | SUBCUTANEOUS | Status: DC
  Administered 2017-08-22: 2 [IU] via SUBCUTANEOUS
  Filled 2017-08-22: qty 1

## 2017-08-22 MED ORDER — HYDRALAZINE HCL 50 MG PO TABS
50.0000 mg | ORAL_TABLET | Freq: Three times a day (TID) | ORAL | Status: DC
  Administered 2017-08-22 – 2017-08-28 (×18): 50 mg via ORAL
  Filled 2017-08-22 (×18): qty 1

## 2017-08-22 MED ORDER — POTASSIUM CHLORIDE CRYS ER 20 MEQ PO TBCR
60.0000 meq | EXTENDED_RELEASE_TABLET | Freq: Once | ORAL | Status: AC
  Administered 2017-08-22: 60 meq via ORAL
  Filled 2017-08-22: qty 3

## 2017-08-22 MED ORDER — ACETAMINOPHEN 650 MG RE SUPP: 650.0000 mg | Freq: Four times a day (QID) | RECTAL | Status: DC | PRN

## 2017-08-22 MED ORDER — FUROSEMIDE 10 MG/ML IJ SOLN
40.0000 mg | Freq: Once | INTRAMUSCULAR | Status: AC
  Administered 2017-08-22: 40 mg via INTRAVENOUS
  Filled 2017-08-22: qty 4

## 2017-08-22 MED ORDER — AMIODARONE HCL IN DEXTROSE 360-4.14 MG/200ML-% IV SOLN
60.0000 mg/h | INTRAVENOUS | Status: AC
  Administered 2017-08-22: 60 mg/h via INTRAVENOUS
  Filled 2017-08-22: qty 400

## 2017-08-22 MED ORDER — POTASSIUM CHLORIDE 10 MEQ/100ML IV SOLN
10.0000 meq | INTRAVENOUS | Status: AC
  Administered 2017-08-22 (×2): 10 meq via INTRAVENOUS
  Filled 2017-08-22 (×2): qty 100

## 2017-08-22 MED ORDER — ACETAMINOPHEN 325 MG PO TABS
650.0000 mg | ORAL_TABLET | Freq: Four times a day (QID) | ORAL | Status: DC | PRN
  Administered 2017-08-22 – 2017-08-27 (×6): 650 mg via ORAL
  Filled 2017-08-22 (×6): qty 2

## 2017-08-22 MED ORDER — AMIODARONE HCL IN DEXTROSE 360-4.14 MG/200ML-% IV SOLN
30.0000 mg/h | INTRAVENOUS | Status: DC
  Administered 2017-08-22 – 2017-08-23 (×3): 30 mg/h via INTRAVENOUS
  Filled 2017-08-22 (×3): qty 200

## 2017-08-22 MED ORDER — CARVEDILOL 6.25 MG PO TABS
6.2500 mg | ORAL_TABLET | Freq: Two times a day (BID) | ORAL | Status: DC
  Administered 2017-08-22 – 2017-08-28 (×12): 6.25 mg via ORAL
  Filled 2017-08-22 (×12): qty 1

## 2017-08-22 MED ORDER — FEBUXOSTAT 40 MG PO TABS
40.0000 mg | ORAL_TABLET | Freq: Every day | ORAL | Status: DC
  Administered 2017-08-23 – 2017-08-28 (×6): 40 mg via ORAL
  Filled 2017-08-22 (×7): qty 1

## 2017-08-22 MED ORDER — ONDANSETRON HCL 4 MG PO TABS: 4.0000 mg | ORAL_TABLET | Freq: Four times a day (QID) | ORAL | Status: DC | PRN

## 2017-08-22 NOTE — ED Notes (Signed)
Patient transported to X-ray 

## 2017-08-22 NOTE — H&P (Signed)
History and Physical    ZAFAR DEBROSSE NIO:[id number] DOB: 08-22-1933 DOA: 08/22/2017  PCP: [name], MD Patient coming from: home  Chief Complaint: pacer firing  HPI: Shane [name] is a very pleasant 84 y.o. male with medical history significant chronic systolic heart failure, diabetes, hypertension, hyperlipidemia CAD presents to the emergency department after he was awakened by shock from his ICD. Was evaluated by cardiology who recommended medical admission due to a potassium level of 2.6.  Information is obtained from the patient. He states over the last couple of days he's had near-syncope and syncope episodes. Other than that he states he's been feeling well. He denies chest pain palpitation. He denies headache dizziness shortness of breath cough lower extremity edema. He denies abdominal pain nausea vomiting diarrhea constipation melena bright red blood per rectum. He denies any dysuria hematuria frequency or urgency. He reports that he called his cardiologist office last week to report he been shocked no changes were made the therapy at that time. He was instructed not to drive pending further recommendations.    ED Course: In the emergency department is afebrile hemodynamically stable and not hypoxic. He is provided with 10 mEq of potassium IV 3. Also provide with oral potassium.  Review of Systems: As per HPI otherwise all other systems reviewed and are negative.   Ambulatory Status: Ambulates independently is independent with ADLs  Past Medical History:  Diagnosis Date  . Abnormal nuclear cardiac imaging test Nov 2010   moderate area of infarct in the inferior wall with only minimal reversibility and EF of 28%  . AICD (automatic cardioverter/defibrillator) present   . Allergic rhinitis 01-08-13   Uses nebulizer for chronic sinus issues and Mucinex.  . Arthritis    osteoarthritis   . Asthma    Extrinic  . Blood transfusion    ? at time of bypass surgery   . BPH  (benign prostatic hyperplasia)   . Cellulitis of arm, left    MSSA  . CHF (congestive heart failure) ([place])   . CKD (chronic kidney disease), stage III ([place])    "was told due to meds he takes"-no Renal workup done  . Colon polyps    adenomatous  . Coronary artery disease    remote CABG in 1985, cath in 2003 by Dr. Lia Foyer with no PCI, last nuclear in 2010 showing scar and EF of 28%.   . Diabetes mellitus   . Diabetic neuropathy ([place]) 04/25/2017  . Dyslipidemia   . Dyspnea   . Gait abnormality 04/25/2017  . Glaucoma 01-08-13   tx. eye drops  . HOH (hard of hearing)   . HTN (hypertension)   . Hypercholesterolemia   . Left ventricular dysfunction    28% per nuclear in 2010 and 35 to 40% per echo in 2010  . Myocardial infarction ([place])    1985  . Neuromuscular disorder ([place])    legs mild paralysis-able to walk"nerve damage"-legs- left leg brace   . Neuropathy   . Pneumonia    hx of several times years ago   . Spinal stenosis     Past Surgical History:  Procedure Laterality Date  . A/V FISTULAGRAM Right 10/17/2016   Procedure: A/V Fistulagram;  Surgeon: Conrad Grove, MD;  Location: [place] [facility] LAB;  Service: Cardiovascular;  Laterality: Right;  . BACK SURGERY     hx of back surgery x 4   . BASCILIC VEIN TRANSPOSITION Right 09/09/2016   Procedure: BASCILIC VEIN TRANSPOSITION Right Arm;  Surgeon: Rosetta Posner, MD;  Location[facility] OR;  Service: Vascular;  Laterality: Right;  . BI-VENTRICULAR IMPLANTABLE CARDIOVERTER DEFIBRILLATOR N/A 10/10/2014   Procedure: BI-VENTRICULAR IMPLANTABLE CARDIOVERTER DEFIBRILLATOR  (CRT-D);  Surgeon: [name], MD;  Location: [facility] CATH LAB;  Service: Cardiovascular;  Laterality: N/A;  . CARDIAC CATHETERIZATION  2003  . Cataract      recent cataract surgery 6'14  . COLONOSCOPY N/A 01/25/2013   Procedure: COLONOSCOPY;  Surgeon: Irene Shipper, MD;  Location: [facility] ENDOSCOPY;  Service: Endoscopy;  Laterality: N/A;  . CORONARY ARTERY BYPASS GRAFT  1985   x 5  vessels  . LUMBAR DISC SURGERY    . PERIPHERAL VASCULAR BALLOON ANGIOPLASTY Right 10/17/2016   Procedure: Peripheral Vascular Balloon Angioplasty;  Surgeon: Conrad Woodmere, MD;  Location: [place] CV LAB;  Service: Cardiovascular;  Laterality: Right;  ARM VEINOUS AND CENTRAL VEIN  . PR POLYSOM 6/>YRS SLEEP 4/> ADDL PARAM ATTND  12/07/2015  . PROSTATE SURGERY    . TOTAL KNEE ARTHROPLASTY  08/01/2011   Procedure: TOTAL KNEE ARTHROPLASTY;  Surgeon: Johnn Hai;  Location: [facility] ORS;  Service: Orthopedics;  Laterality: Right;    Social History   Socioeconomic History  . Marital status: Married    Spouse name: Not on file  . Number of children: 2  . Years of education: Not on file  . Highest education level: Not on file  Social Needs  . Financial resource strain: Not on file  . Food insecurity - worry: Not on file  . Food insecurity - inability: Not on file  . Transportation needs - medical: Not on file  . Transportation needs - non-medical: Not on file  Occupational History  . Occupation: pastor  Tobacco Use  . Smoking status: Former Smoker    Packs/day: 1.00    Years: 5.00    Pack years: 5.00    Types: Cigarettes, Pipe, Cigars    Last attempt to quit: 08/05/1958    Years since quitting: 59.0  . Smokeless tobacco: Never Used  Substance and Sexual Activity  . Alcohol use: No  . Drug use: No  . Sexual activity: Not Currently  Other Topics Concern  . Not on file  Social History Narrative   Originally from [place]. He has always lived in [place]. Prior travel to [place], [place], [place], [place] ,[place], & [place]. 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 Has adopted children. Has a dog currently. No bird exposure. No mold exposure. Enjoys reading & traveling.    Allergies  Allergen Reactions  . Codeine Other (See Comments)    anxiety  . Hydrocodone Other (See Comments)    Anxiety- can take in liquid form  . Pseudoephedrine Other (See Comments)    Causes heart to race   . Azithromycin Rash    Family History  Problem Relation Age of Onset  . Heart attack Father   . Heart disease Father   . Colon polyps Father   . Lung cancer Mother   . Diabetes Sister        x 2  . Heart disease Brother        x 2  . Bone cancer Brother   . Lung disease Neg Hx     Prior to Admission medications   Medication Sig Start Date End Date Taking? Authorizing Provider  acetaminophen (TYLENOL) 500 MG tablet Take 1,000 mg by mouth every 8 (eight) hours as needed (pain).    Yes [provider]  albuterol (PROVENTIL HFA;VENTOLIN HFA) 108 (90 Base) MCG/ACT inhaler Inhale 2 puffs into the lungs every 4 (four) hours as needed for wheezing or shortness of breath (cough, shortness of breath or wheezing.). 02/10/17  Yes Elby Beck, FNP  amLODipine (NORVASC) 5 MG tablet Take 1 tablet (5 mg total) by mouth daily. 05/06/17  Yes Shirley Friar, PA-C  aspirin EC 81 MG tablet Take 81 mg by mouth daily.    Yes [provider]  atorvastatin (LIPITOR) 10 MG tablet Take 1 tablet (10 mg total) by mouth daily. 05/28/17  Yes Burns, Claudina Lick, MD  carvedilol (COREG) 6.25 MG tablet Take 1 tablet (6.25 mg total) by mouth 2 (two) times daily with a meal. 05/06/17  Yes Tillery, Satira Mccallum, PA-C  cetirizine (ZYRTEC) 10 MG tablet Take 10 mg by mouth at bedtime as needed (seasonal allergies).    Yes [provider]  febuxostat (ULORIC) 40 MG tablet Take 1 tablet (40 mg total) by mouth daily. 07/21/17  Yes Burns, Claudina Lick, MD  fluocinonide cream (LIDEX) 1.19 % Apply 1 application topically 2 (two) times daily as needed (for itchy rash).  03/16/15  Yes [provider]  gabapentin (NEURONTIN) 300 MG capsule Take 1 capsule (300 mg total) by mouth at bedtime. 08/14/17  Yes Kathrynn Ducking, MD  hydrALAZINE (APRESOLINE) 50 MG tablet Take 1 tablet (50 mg total) 3 (three) times daily by mouth. 06/19/17  Yes Tillery, Satira Mccallum, PA-C  metolazone (ZAROXOLYN) 5 MG  tablet Take 1 pill as directed by HF clinic if weight goes up 3 lbs overnight, or 5 lbs within one week. Call before taking. 05/06/17  Yes Shirley Friar, PA-C  omalizumab Arvid Right) 150 MG injection Inject 300 mg into the skin every 14 (fourteen) days. Last injection given 04/25/17   Yes [provider]  potassium chloride (MICRO-K) 10 MEQ CR capsule Take 1 capsule (10 mEq total) by mouth daily as needed (with each metolazone dose). 08/15/17  Yes Bensimhon, Shaune Pascal, MD  tadalafil (CIALIS) 20 MG tablet Take 20 mg by mouth daily as needed for erectile dysfunction. Do not exceed 2 doses in 7 days   Yes [provider]  testosterone cypionate (DEPOTESTOTERONE CYPIONATE) 100 MG/ML injection Inject 400 mg into the muscle every 21 ( twenty-one) days. For IM use only   Yes [provider]  torsemide (DEMADEX) 20 MG tablet Take 5 tablets (100 mg total) by mouth daily. 05/13/17  Yes Arbutus Leas, NP  triamcinolone (NASACORT AQ) 55 MCG/ACT AERO nasal inhaler Place 2 sprays into the nose daily as needed (seasonal allergies).    Yes [provider]  zolpidem (AMBIEN) 10 MG tablet TAKE 1/2 TO 1 (ONE-HALF TO ONE) TABLET BY MOUTH AT BEDTIME AS NEEDED FOR SLEEP 07/10/17  Yes [name], MD    Physical Exam: Vitals:   08/22/17 1330 08/22/17 1400 08/22/17 1430 08/22/17 1613  BP: (!) 148/64 137/72 (!) 130/55 114/80  Pulse: 95 93 88   Resp: (!) 21 (!) 22 19   Temp:      TempSrc:      SpO2: 91% 92% 90%   Weight:      Height:         General:  Appears calm and comfortable Eyes:  PERRL, EOMI, normal lids, iris ENT:  grossly normal hearing, lips & tongue, mmm Neck:  no LAD, masses or thyromegaly Cardiovascular:  RRR, no m/r/g. No LE edema.  Respiratory:  CTA bilaterally, no w/r/r. Normal  respiratory effort. Abdomen:  soft, ntnd, NABS Skin:  no rash or induration seen on limited exam Musculoskeletal:  grossly normal tone BUE/BLE, good ROM, no bony  abnormality Psychiatric:  grossly normal mood and affect, speech fluent and appropriate, AOx3 Neurologic:  CN 2-12 grossly intact, moves all extremities in coordinated fashion, sensation intact  Labs on Admission: I have personally reviewed following labs and imaging studies  CBC: Recent Labs  Lab 08/22/17 1139  WBC 6.7  NEUTROABS 4.8  HGB 10.5*  HCT 33.8*  MCV 93.4  PLT 681*   Basic Metabolic Panel: Recent Labs  Lab 08/22/17 1139  NA 137  K 2.6*  CL 96*  CO2 28  GLUCOSE 155*  BUN 86*  CREATININE 2.67*  CALCIUM 9.1   GFR: Estimated Creatinine Clearance: 24.3 mL/min (A) (by C-G formula based on SCr of 2.67 mg/dL (H)). Liver Function Tests: Recent Labs  Lab 08/22/17 1139  AST 17  ALT 17  ALKPHOS 58  BILITOT 1.1  PROT 6.3*  ALBUMIN 3.3*   No results for input(s): LIPASE, AMYLASE in the last 168 hours. No results for input(s): AMMONIA in the last 168 hours. Coagulation Profile: No results for input(s): INR, PROTIME in the last 168 hours. Cardiac Enzymes: Recent Labs  Lab 08/22/17 1139  TROPONINI 0.07*   BNP (last 3 results) No results for input(s): PROBNP in the last 8760 hours. HbA1C: No results for input(s): HGBA1C in the last 72 hours. CBG: No results for input(s): GLUCAP in the last 168 hours. Lipid Profile: No results for input(s): CHOL, HDL, LDLCALC, TRIG, CHOLHDL, LDLDIRECT in the last 72 hours. Thyroid Function Tests: No results for input(s): TSH, T4TOTAL, FREET4, T3FREE, THYROIDAB in the last 72 hours. Anemia Panel: No results for input(s): VITAMINB12, FOLATE, FERRITIN, TIBC, IRON, RETICCTPCT in the last 72 hours. Urine analysis:    Component Value Date/Time   COLORURINE YELLOW 10/05/2014 2249   APPEARANCEUR CLEAR 10/05/2014 2249   LABSPEC 1.014 10/05/2014 2249   PHURINE 6.0 10/05/2014 2249   GLUCOSEU 100 (A) 10/05/2014 2249   GLUCOSEU NEGATIVE 06/29/2012 1218   HGBUR MODERATE (A) 10/05/2014 2249   BILIRUBINUR NEGATIVE 10/05/2014 2249    KETONESUR NEGATIVE 10/05/2014 2249   PROTEINUR >300 (A) 10/05/2014 2249   UROBILINOGEN 0.2 10/05/2014 2249   NITRITE NEGATIVE 10/05/2014 2249   LEUKOCYTESUR NEGATIVE 10/05/2014 2249    Creatinine Clearance: Estimated Creatinine Clearance: 24.3 mL/min (A) (by C-G formula based on SCr of 2.67 mg/dL (H)).  Sepsis Labs: @LABRCNTIP (procalcitonin:4,lacticidven:4) )No results found for this or any previous visit (from the past 240 hour(s)).   Radiological Exams on Admission: Dg Chest 2 View  Result Date: 08/22/2017 CLINICAL DATA:  Syncope EXAM: CHEST  2 VIEW COMPARISON:  May 02, 2017 FINDINGS: There is no edema or consolidation. A monitor lead overlies area of questionable nodular opacity in the medial right upper lobe seen on prior study. Heart is upper normal in size with pulmonary vascularity within normal limits. Pacemaker leads are attached to the right atrium, right ventricle, and coronary sinus. No evident adenopathy. Patient is status post median sternotomy. No bone lesions. IMPRESSION: No edema or consolidation. Note that a monitor lead overlies the medial right upper lobe in the area of questionable nodular opacity seen on most recent study. It may be prudent to consider obtaining repeat frontal view chest with monitor leads removed. Stable cardiac silhouette. Electronically Signed   By: Lowella Grip III M.D.   On: 08/22/2017 12:20    EKG: Independently reviewed. Sinus rhythm  Multiform ventricular premature complexes Left bundle branch block Abnormal ekg  Assessment/Plan Principal Problem:   Hypokalemia Active Problems:   DM (diabetes mellitus), type 2 with renal complications (HCC)   CAD s/p coronary arthery bypass graft   BPH (benign prostatic hyperplasia)   Chronic kidney disease (CKD), stage IV (severe) (HCC)   Syncope   Essential hypertension   Chronic combined systolic and diastolic CHF (congestive heart failure) (HCC)   Diabetic neuropathy (HCC)   Elevated  troponin   ICD (implantable cardioverter-defibrillator) discharge   #1. Hypokalemia. Potassium level 2.6. Likely related to diuretics in the setting of decreased oral intake. 3 runs of potassium intravenously ordered in the emergency department. Also given oral potassium -Admit to telemetry -Replete change IV potassium to 2 runs -Check magnesium level -Recheck at 8pm   #2.ICD discharge. 2 episodes 1 5 days ago 1 yesterday. Evaluated by cardiology who opined to his burden VT and recent shocks they recommend IV amiodarone. Cardiology note indicates he had 2 episodes of obvious VT when treated with ATP that was successfuly terminated one treated with ATP that accelerated VT and required shocks.  -defer management to cardiology  3. Syncope. -See above. -Obtain a TSH -Obtain 2-D echo -Orthostatic vital signs  4. Chronic kidney disease stage IV. Creatinine 2.6 on admission. This appears to be fairly close to his baseline. -Monitor urine output -Hold nephrotoxins -Recheck in the morning  #5. Chronic combined systolic and diastolic heart failure. Appears compensated. Echocardiogram in April 2017 reveals an EF of 40-45%. Home medications include clonidine, Coreg, Zaroxolyn. BNP 918. EKG as noted above. Initial troponin 0.07. -Hold Zaroxolyn for now -Continue home meds -Obtain a 2-D echo -Cycle troponin  #6. Diabetes. Serum glucose 155. Diet controlled -Obtain a hemoglobin A1c -Sliding scale insulin for optimal control  #7. Hypertension. Fair control in the emergency department home medications include Norvasc, Coreg, April Apresoline, Zaroxolyn demadex -Mudlogger and Coreg -Monitor    DVT prophylaxis: lovenox  Code Status: full  Family Communication: none present  Disposition Plan: home  Consults called:  [place] Admission status: observation    Radene Gunning MD [facility]  If 7PM-7AM, please contact night-coverage [email or web address] Password  [facility]  08/22/2017, 4:38 PM

## 2017-08-22 NOTE — ED Notes (Signed)
Heart healthy carb modified meal tray ordered for pt at this time. Pt frustrated with lack of hot meal today, pt provided with apple sauce and water as snack

## 2017-08-22 NOTE — Consult Note (Addendum)
Cardiology Consultation:   Patient ID: ZUHAYR DEENEY; [id number]; 1933-09-26   Admit date: 08/22/2017 Date of Consult: 08/22/2017  Primary Care Provider: [name], MD Primary Cardiologist: Dr. Acie Fredrickson Primary Electrophysiologist:  Dr. Caryl Comes Nephrologist: Dr. Hollie Salk AHF: Dr. Haroldine Laws   Patient Profile:   MARKEEM NOREEN is a 84 y.o. male with a hx of CAD (remote CABG 1995, last cath in 2003), CM, chronic CHF (systolic), CRI (IV, has AVF not yet on HD), LBBB,  March 2016 hospitalized w/VT underwent CRT-D implant, DM, HTN, HLD, who is being seen today for the evaluation of syncope/ICD therapies at the request of Dr. Vanita Panda.  History of Present Illness:   Mr. Onstott comes to [facility] ER today after being woken by shock from his ICD.  Given recent episodes of syncope and ICD therapies in the last week or so he came in.  The patient of late has been feeling well, no CP, palpitations, he denies any changes to his exertional capacity, no symptoms of PND or orthopnea.  With his 2 syncopal events one while seated collapsed forward to the table, the other was in the BR and though he had fallen, this was Saturday last weekend and was called by our office informed he had been shocked, no changes were made to therapy, instructed not to drive, pending further recommendations from Dr. Caryl Comes.  He feels well currently, is without symptoms  LABS: pending  Device information: MDT CRT-D, implanted 10/11/14 Device interrogation: pending  Home AAD: none  Past Medical History:  Diagnosis Date  . Abnormal nuclear cardiac imaging test Nov 2010   moderate area of infarct in the inferior wall with only minimal reversibility and EF of 28%  . AICD (automatic cardioverter/defibrillator) present   . Allergic rhinitis 01-08-13   Uses nebulizer for chronic sinus issues and Mucinex.  . Arthritis    osteoarthritis   . Asthma    Extrinic  . Blood transfusion    ? at time of bypass surgery   . BPH (benign  prostatic hyperplasia)   . Cellulitis of arm, left    MSSA  . CHF (congestive heart failure) ([place])   . CKD (chronic kidney disease), stage III ([place])    "was told due to meds he takes"-no Renal workup done  . Colon polyps    adenomatous  . Coronary artery disease    remote CABG in 1985, cath in 2003 by Dr. Lia Foyer with no PCI, last nuclear in 2010 showing scar and EF of 28%.   . Diabetes mellitus   . Diabetic neuropathy ([place]) 04/25/2017  . Dyslipidemia   . Dyspnea   . Gait abnormality 04/25/2017  . Glaucoma 01-08-13   tx. eye drops  . HOH (hard of hearing)   . HTN (hypertension)   . Hypercholesterolemia   . Left ventricular dysfunction    28% per nuclear in 2010 and 35 to 40% per echo in 2010  . Myocardial infarction ([place])    1985  . Neuromuscular disorder ([place])    legs mild paralysis-able to walk"nerve damage"-legs- left leg brace   . Neuropathy   . Pneumonia    hx of several times years ago   . Spinal stenosis     Past Surgical History:  Procedure Laterality Date  . A/V FISTULAGRAM Right 10/17/2016   Procedure: A/V Fistulagram;  Surgeon: Conrad Cherokee, MD;  Location: [facility];  Service: Cardiovascular;  Laterality: Right;  . BACK SURGERY     hx of back  surgery x 4   . BASCILIC VEIN TRANSPOSITION Right 09/09/2016   Procedure: BASCILIC VEIN TRANSPOSITION Right Arm;  Surgeon: Rosetta Posner, MD;  Location: [facility] OR;  Service: Vascular;  Laterality: Right;  . BI-VENTRICULAR IMPLANTABLE CARDIOVERTER DEFIBRILLATOR N/A 10/10/2014   Procedure: BI-VENTRICULAR IMPLANTABLE CARDIOVERTER DEFIBRILLATOR  (CRT-D);  Surgeon: [name], MD;  Location: [facility] CATH LAB;  Service: Cardiovascular;  Laterality: N/A;  . CARDIAC CATHETERIZATION  2003  . Cataract      recent cataract surgery 6'14  . COLONOSCOPY N/A 01/25/2013   Procedure: COLONOSCOPY;  Surgeon: Irene Shipper, MD;  Location: [facility] ENDOSCOPY;  Service: Endoscopy;  Laterality: N/A;  . CORONARY ARTERY BYPASS GRAFT  1985   x 5 vessels  .  LUMBAR DISC SURGERY    . PERIPHERAL VASCULAR BALLOON ANGIOPLASTY Right 10/17/2016   Procedure: Peripheral Vascular Balloon Angioplasty;  Surgeon: Conrad Astatula, MD;  Location: [facility];  Service: Cardiovascular;  Laterality: Right;  ARM VEINOUS AND CENTRAL VEIN  . PR POLYSOM 6/>YRS SLEEP 4/> ADDL PARAM ATTND  12/07/2015  . PROSTATE SURGERY    . TOTAL KNEE ARTHROPLASTY  08/01/2011   Procedure: TOTAL KNEE ARTHROPLASTY;  Surgeon: Johnn Hai;  Location: [facility] ORS;  Service: Orthopedics;  Laterality: Right;       Inpatient Medications: Scheduled Meds:  Continuous Infusions:  PRN Meds:   Allergies:    Allergies  Allergen Reactions  . Codeine Other (See Comments)    anxiety  . Hydrocodone Other (See Comments)    Anxiety- can take in liquid form  . Pseudoephedrine Other (See Comments)    Causes heart to race  . Azithromycin Rash    Social History:   Social History   Socioeconomic History  . Marital status: Married    Spouse name: Not on file  . Number of children: 2  . Years of education: Not on file  . Highest education level: Not on file  Social Needs  . Financial resource strain: Not on file  . Food insecurity - worry: Not on file  . Food insecurity - inability: Not on file  . Transportation needs - medical: Not on file  . Transportation needs - non-medical: Not on file  Occupational History  . Occupation: pastor  Tobacco Use  . Smoking status: Former Smoker    Packs/day: 1.00    Years: 5.00    Pack years: 5.00    Types: Cigarettes, Pipe, Cigars    Last attempt to quit: 08/05/1958    Years since quitting: 59.0  . Smokeless tobacco: Never Used  Substance and Sexual Activity  . Alcohol use: No  . Drug use: No  . Sexual activity: Not Currently  Other Topics Concern  . Not on file  Social History Narrative   Originally from [place]. He has always lived in [place]. Prior travel to [place], [place], [place], [place] ,[place], & [place]. 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 Has adopted children. Has a dog currently. No bird exposure. No mold exposure. Enjoys reading & traveling.    Family History:   Family History  Problem Relation Age of Onset  . Heart attack Father   . Heart disease Father   . Colon polyps Father   . Lung cancer Mother   . Diabetes Sister        x 2  . Heart disease Brother        x 2  . Bone cancer Brother   . Lung disease  Neg Hx      ROS:  Please see the history of present illness.  ROS  All other ROS reviewed and negative.     Physical Exam/Data:   Vitals:   08/22/17 1015 08/22/17 1100 08/22/17 1115 08/22/17 1130  BP: (!) 141/75 (!) 141/76 (!) 149/80 140/74  Pulse: 89 91 92 91  Resp: 12 17 17 18   Temp:      TempSrc:      SpO2: 92% 91% 92% 92%  Weight:      Height:       No intake or output data in the 24 hours ending 08/22/17 1159 Filed Weights   08/22/17 0936  Weight: 203 lb (92.1 kg)   Body mass index is 29.13 kg/m.  General:  Well nourished, well developed, in no acute distress HEENT: normal Lymph: no adenopathy Neck: no JVD Endocrine:  No thryomegaly Vascular: No carotid bruits; FA pulses 2+ bilaterally without bruits  Cardiac:   RRR; 1/6 SM, no gallops or rubs Lungs:  CTA b/l,  no wheezing, rhonchi or rales  Abd: soft, nontender  Ext: no edema Musculoskeletal:  No deformities, age appropriate atrophy Skin: warm and dry  Neuro:  no focal abnormalities noted Psych:  Normal affect   EKG:  The EKG was personally reviewed and demonstrates:   SR, V paced, PVC (multifocal) Telemetry:  Telemetry was personally reviewed and demonstrates:   SR, frequent PVC,s multifocal, NSVT  Up to 3-4 in a row occassionally   Relevant CV Studies:  11/16/15: TTE Study Conclusions - Left ventricle: The cavity size was moderately dilated. Wall   thickness was normal. Systolic function was mildly to moderately   reduced. The estimated ejection fraction was in the range of 40%   to 45%.  Findings consistent with left ventricular diastolic   dysfunction. - Regional wall motion abnormality: Akinesis of the basal inferior   and basal inferolateral myocardium. - Aortic valve: Borderline mild aortic stenosis. Moderately   calcified annulus. Trileaflet; mildly thickened, mildly calcified   leaflets. Peak velocity (S): 225 cm/s. Mean gradient (S): 12 mm   Hg. Valve area (VTI): 2.02 cm^2. Valve area (Vmax): 2.13 cm^2.   Valve area (Vmean): 2.08 cm^2. - Mitral valve: There was mild regurgitation. - Left atrium: The atrium was moderately dilated. - Right ventricle: Pacer wire or catheter noted in right ventricle.   Systolic function was mildly reduced. - Right atrium: Pacer wire or catheter noted in right atrium. - Tricuspid valve: There was mild regurgitation. - Pulmonary arteries: PA peak pressure: 31 mm Hg (S).  Laboratory Data:  ChemistryNo results for input(s): NA, K, CL, CO2, GLUCOSE, BUN, CREATININE, CALCIUM, GFRNONAA, GFRAA, ANIONGAP in the last 168 hours.  No results for input(s): PROT, ALBUMIN, AST, ALT, ALKPHOS, BILITOT in the last 168 hours. HematologyNo results for input(s): WBC, RBC, HGB, HCT, MCV, MCH, MCHC, RDW, PLT in the last 168 hours. Cardiac EnzymesNo results for input(s): TROPONINI in the last 168 hours. No results for input(s): TROPIPOC in the last 168 hours.  BNPNo results for input(s): BNP, PROBNP in the last 168 hours.  DDimer No results for input(s): DDIMER in the last 168 hours.  Radiology/Studies:  No results found.  Assessment and Plan:   1. ICD therapy     Await ICD check and labs   I anticipate need for admission for amiodarone, though Laine Fonner wait results of above  2. CAD     No anginal symptoms  3. CM, chronic CHF  Exam does not suggest fluid OL currently    For questions or updates, please contact [facility] Please consult [email or web address] for contact info under Cardiology/STEMI.   Signed, Baldwin Jamaica, PA-C  08/22/2017  11:59 AM;t  I have seen and examined this patient with Tommye Standard.  Agree with above, note added to reflect my findings.  On exam, RRR, no murmurs, lungs clear.  Patient with multiple episodes of VT and VT shocks.  Also had quite a large amount of nonsustained VT.  His potassium was found to be low today.  Due to his burden of VT and recent shocks, we Mckay Tegtmeyer plan to start him on IV amiodarone.  He Davionne Mastrangelo likely need a 24-hour load.  He had 2 episodes of obvious VT when treated with ATP that was successfully terminated, one was treated with ATP that accelerated VT and required shocks.  Also had an episode of what appeared to be ventricular fibrillation/polymorphic VT requiring shocks from his defibrillator.  After that, he Maverick Dieudonne be switched to p.o. amiodarone.  He Garima Chronis need his potassium repleted.  Dachelle Molzahn M. Nimra Puccinelli MD 08/22/2017 3:39 PM

## 2017-08-22 NOTE — Progress Notes (Signed)
Patient's device interrogation was completed Battery stable + 25J shock this AM, appropriate for PMVT in VF zone, cycle length 170-259ms On 08/18/17 he has MMVT treated successfully with 2 rounds of ATP On 08/17/17 he had MMVT treated successfully with one ATP On 08/16/17 he had 2 ATP 2nd round accelerated to VF zone and got 25J shock successful  RV threshold is high 4.25V/1.28ms, 98.9% LV pacing (BiVe 1.1%), I Clint Biello review historical device interrogations.  The patient's labs are back He is markedly hypokalemic, with K+ of 2.6, ER MD has ordered replacement Given this, I Hanin Decook defer to medicine team given his advanced CRI replacement, I Aleane Wesenberg discuss with Dr. Curt Bears any further EP input or recommendations  Tommye Standard, PA-C   Addend: Spoke with Dr. Curt Bears, we would prefer medicine team admission/management of electrolyte imbalance/CRI, d/w RN, Flordia Kassem make ED MD aware.  Continue home cardiac drugs we Arif Amendola start amiodarone IV.  Allegra Lai, MD

## 2017-08-22 NOTE — ED Provider Notes (Signed)
Informed by nurse cardiology states that they will be happy to manage his pacemaker problem however, they recommend medical admission for management of electrolyte abnormalities.   Shane [name] 08/28/17 1436    Carmin Muskrat, MD 08/29/17 [phone]

## 2017-08-22 NOTE — ED Triage Notes (Addendum)
Per [facility] patient coming from home called out after pacemaker/defibrilator fired this am around 8am. Patient denies any pain, sob, nausea. Patient reports cardiology doctor calling last Saturday reporting it firing then as well. EKG sinus rhythm paced with some PVCs with ems. Patient did not feel this episode. Patient alert and oriented x4. Pain free.

## 2017-08-22 NOTE — ED Provider Notes (Signed)
[facility] EMERGENCY DEPARTMENT Provider Note   CSN: [id number] Arrival date & time: 08/22/17  6269     History   Chief Complaint Chief Complaint  Patient presents with  . Pacemaker Problem    HPI Shane [name] is a 84 y.o. male.  HPI Patient presents with concern that his pacemaker is firing. Patient went to bed in his usual state of health, this morning, awoke, and soon thereafter had his pacemaker fire. Currently he denies any complaints, states that he is generally well. He does have notable history of recent episode of syncope, following which he was informed that his pacemaker fired. He has not yet seen his elective physiologist since these 2 events. He denies recent medication changes, diet changes, activity changes.  When he states that he takes all medication as directed, including Lasix. He has had some weight gain and swelling, but no new dyspnea, or chest pain. Past Medical History:  Diagnosis Date  . Abnormal nuclear cardiac imaging test Nov 2010   moderate area of infarct in the inferior wall with only minimal reversibility and EF of 28%  . AICD (automatic cardioverter/defibrillator) present   . Allergic rhinitis 01-08-13   Uses nebulizer for chronic sinus issues and Mucinex.  . Arthritis    osteoarthritis   . Asthma    Extrinic  . Blood transfusion    ? at time of bypass surgery   . BPH (benign prostatic hyperplasia)   . Cellulitis of arm, left    MSSA  . CHF (congestive heart failure) ([place])   . CKD (chronic kidney disease), stage III ([place])    "was told due to meds he takes"-no Renal workup done  . Colon polyps    adenomatous  . Coronary artery disease    remote CABG in 1985, cath in 2003 by Dr. Lia Foyer with no PCI, last nuclear in 2010 showing scar and EF of 28%.   . Diabetes mellitus   . Diabetic neuropathy ([place]) 04/25/2017  . Dyslipidemia   . Dyspnea   . Gait abnormality 04/25/2017  . Glaucoma 01-08-13   tx. eye drops  . HOH  (hard of hearing)   . HTN (hypertension)   . Hypercholesterolemia   . Left ventricular dysfunction    28% per nuclear in 2010 and 35 to 40% per echo in 2010  . Myocardial infarction ([place])    1985  . Neuromuscular disorder ([place])    legs mild paralysis-able to walk"nerve damage"-legs- left leg brace   . Neuropathy   . Pneumonia    hx of several times years ago   . Spinal stenosis     Patient Active Problem List   Diagnosis Date Noted  . Chronic gout of left knee 07/21/2017  . Fever 05/28/2017  . Acute on chronic diastolic heart failure ([place]) 05/01/2017  . Diabetic neuropathy ([place]) 04/25/2017  . Gait abnormality 04/25/2017  . Numbness and tingling of both feet 03/04/2017  . Peripheral neuropathy 03/04/2017  . Carotid artery disease ([place]) 01/09/2017  . Chronic combined systolic and diastolic CHF (congestive heart failure) ([place]) 07/01/2016  . Restrictive lung disease 01/22/2016  . Severe obstructive sleep apnea 12/17/2015  . Essential hypertension 11/16/2015  . 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 LBBB (left bundle branch block) 10/10/2014  . Syncope 10/06/2014  . Pain in thumb joint with movement of right hand 01/04/2014  . Sinusitis, chronic 07/16/2013  . Asthma, moderate persistent 07/08/2013  . Chronic infection of sinus 07/31/2012  . Benign prostatic hypertrophy without  urinary obstruction 12/18/2011  . CAD s/p coronary arthery bypass graft   . BPH (benign prostatic hyperplasia)   . Chronic kidney disease (CKD), stage IV (severe) ([place])   . H/O Spinal stenosis   . DM (diabetes mellitus), type 2 with renal complications ([place]) 53/61/4431  . Benign hypertensive heart disease without heart failure 11/07/2010  . Allergic rhinitis 11/07/2010  . Osteoarthritis 11/07/2010  . Ischemic cardiomyopathy   . Hypercholesterolemia     Past Surgical History:  Procedure Laterality Date  . A/V FISTULAGRAM Right 10/17/2016   Procedure: A/V Fistulagram;  Surgeon: Conrad New Salem, MD;  Location: [facility];  Service: Cardiovascular;  Laterality: Right;  . BACK SURGERY     hx of back surgery x 4   . BASCILIC VEIN TRANSPOSITION Right 09/09/2016   Procedure: BASCILIC VEIN TRANSPOSITION Right Arm;  Surgeon: Rosetta Posner, MD;  Location: [facility] OR;  Service: Vascular;  Laterality: Right;  . BI-VENTRICULAR IMPLANTABLE CARDIOVERTER DEFIBRILLATOR N/A 10/10/2014   Procedure: BI-VENTRICULAR IMPLANTABLE CARDIOVERTER DEFIBRILLATOR  (CRT-D);  Surgeon: [name], MD;  Location: [facility] CATH LAB;  Service: Cardiovascular;  Laterality: N/A;  . CARDIAC CATHETERIZATION  2003  . Cataract      recent cataract surgery 6'14  . COLONOSCOPY N/A 01/25/2013   Procedure: COLONOSCOPY;  Surgeon: Irene Shipper, MD;  Location: [facility] ENDOS[facility];  Service: Endoscopy;  Laterality: N/A;  . CORONARY ARTERY BYPASS GRAFT  1985   x 5 vessels  . LUMBAR DISC SURGERY    . PERIPHERAL VASCULAR BALLOON ANGIOPLASTY Right 10/17/2016   Procedure: Peripheral Vascular Balloon Angioplasty;  Surgeon: Conrad Anon Raices, MD;  Location: [facility];  Service: Cardiovascular;  Laterality: Right;  ARM VEINOUS AND CENTRAL VEIN  . PR POLYSOM 6/>YRS SLEEP 4/> ADDL PARAM ATTND  12/07/2015  . PROSTATE SURGERY    . TOTAL KNEE ARTHROPLASTY  08/01/2011   Procedure: TOTAL KNEE ARTHROPLASTY;  Surgeon: Johnn Hai;  Location: [facility] ORS;  Service: Orthopedics;  Laterality: Right;       Home Medications    Prior to Admission medications   Medication Sig Start Date End Date Taking? Authorizing Provider  acetaminophen (TYLENOL) 500 MG tablet Take 1,000 mg by mouth every 8 (eight) hours as needed (pain).    Yes [provider]  albuterol (PROVENTIL HFA;VENTOLIN HFA) 108 (90 Base) MCG/ACT inhaler Inhale 2 puffs into the lungs every 4 (four) hours as needed for wheezing or shortness of breath (cough, shortness of breath or wheezing.). 02/10/17  Yes Elby Beck, FNP  amLODipine (NORVASC) 5 MG tablet Take 1 tablet (5 mg total) by mouth daily.  05/06/17  Yes Shirley Friar, PA-C  aspirin EC 81 MG tablet Take 81 mg by mouth daily.    Yes [provider]  atorvastatin (LIPITOR) 10 MG tablet Take 1 tablet (10 mg total) by mouth daily. 05/28/17  Yes Burns, Claudina Lick, MD  carvedilol (COREG) 6.25 MG tablet Take 1 tablet (6.25 mg total) by mouth 2 (two) times daily with a meal. 05/06/17  Yes Tillery, Satira Mccallum, PA-C  cetirizine (ZYRTEC) 10 MG tablet Take 10 mg by mouth at bedtime as needed (seasonal allergies).    Yes [provider]  febuxostat (ULORIC) 40 MG tablet Take 1 tablet (40 mg total) by mouth daily. 07/21/17  Yes Burns, Claudina Lick, MD  fluocinonide cream (LIDEX) 5.40 % Apply 1 application topically 2 (two) times daily as needed (for itchy rash).  03/16/15  Yes [provider]  gabapentin (NEURONTIN) 300 MG capsule Take 1 capsule (300 mg total) by mouth at bedtime. 08/14/17  Yes Kathrynn Ducking, MD  hydrALAZINE (APRESOLINE) 50 MG tablet Take 1 tablet (50 mg total) 3 (three) times daily by mouth. 06/19/17  Yes Tillery, Satira Mccallum, PA-C  metolazone (ZAROXOLYN) 5 MG tablet Take 1 pill as directed by HF clinic if weight goes up 3 lbs overnight, or 5 lbs within one week. Call before taking. 05/06/17  Yes Shirley Friar, PA-C  omalizumab Arvid Right) 150 MG injection Inject 300 mg into the skin every 14 (fourteen) days. Last injection given 04/25/17   Yes [provider]  potassium chloride (MICRO-K) 10 MEQ CR capsule Take 1 capsule (10 mEq total) by mouth daily as needed (with each metolazone dose). 08/15/17  Yes Bensimhon, Shaune Pascal, MD  tadalafil (CIALIS) 20 MG tablet Take 20 mg by mouth daily as needed for erectile dysfunction. Do not exceed 2 doses in 7 days   Yes [provider]  testosterone cypionate (DEPOTESTOTERONE CYPIONATE) 100 MG/ML injection Inject 400 mg into the muscle every 21 ( twenty-one) days. For IM use only   Yes [provider]  torsemide (DEMADEX) 20 MG  tablet Take 5 tablets (100 mg total) by mouth daily. 05/13/17  Yes Arbutus Leas, NP  triamcinolone (NASACORT AQ) 55 MCG/ACT AERO nasal inhaler Place 2 sprays into the nose daily as needed (seasonal allergies).    Yes [provider]  zolpidem (AMBIEN) 10 MG tablet TAKE 1/2 TO 1 (ONE-HALF TO ONE) TABLET BY MOUTH AT BEDTIME AS NEEDED FOR SLEEP 07/10/17  Yes Burns, Claudina Lick, MD    Family History Family History  Problem Relation Age of Onset  . Heart attack Father   . Heart disease Father   . Colon polyps Father   . Lung cancer Mother   . Diabetes Sister        x 2  . Heart disease Brother        x 2  . Bone cancer Brother   . Lung disease Neg Hx     Social History Social History   Tobacco Use  . Smoking status: Former Smoker    Packs/day: 1.00    Years: 5.00    Pack years: 5.00    Types: Cigarettes, Pipe, Cigars    Last attempt to quit: 08/05/1958    Years since quitting: 59.0  . Smokeless tobacco: Never Used  Substance Use Topics  . Alcohol use: No  . Drug use: No     Allergies   Codeine; Hydrocodone; Pseudoephedrine; and Azithromycin   Review of Systems Review of Systems  Constitutional:       Per HPI, otherwise negative  HENT:       Per HPI, otherwise negative  Respiratory:       Per HPI, otherwise negative  Cardiovascular:       Per HPI, otherwise negative  Gastrointestinal: Negative for vomiting.  Endocrine:       Negative aside from HPI  Genitourinary:       Neg aside from HPI   Musculoskeletal:       Per HPI, otherwise negative  Skin: Negative.   Neurological: Positive for syncope.     Physical Exam Updated Vital Signs BP 132/70   Pulse 90   Temp 98.2 F (36.8 C) (Oral)   Resp 19   Ht 5\' 10"  (1.778 m)   Wt 92.1 kg (203 lb)   SpO2 91%   BMI 29.13 kg/m  Physical Exam  Constitutional: He is oriented to person, place, and time. He appears well-developed. No distress.  HENT:  Head: Normocephalic and atraumatic.  Eyes: Conjunctivae  and EOM are normal.  Cardiovascular: Normal rate and regular rhythm.  Pulmonary/Chest: Effort normal. No stridor. No respiratory distress.  Abdominal: He exhibits no distension.  Musculoskeletal: He exhibits no edema.  Neurological: He is alert and oriented to person, place, and time.  Skin: Skin is warm and dry.  Psychiatric: He has a normal mood and affect.  Nursing note and vitals reviewed.    ED Treatments / Results  Labs (all labs ordered are listed, but only abnormal results are displayed) Labs Reviewed  COMPREHENSIVE METABOLIC PANEL - Abnormal; Notable for the following components:      Result Value   Potassium 2.6 (*)    Chloride 96 (*)    Glucose, Bld 155 (*)    BUN 86 (*)    Creatinine, Ser 2.67 (*)    Total Protein 6.3 (*)    Albumin 3.3 (*)    GFR calc non Af Amer 21 (*)    GFR calc Af Amer 24 (*)    All other components within normal limits  BRAIN NATRIURETIC PEPTIDE - Abnormal; Notable for the following components:   B Natriuretic Peptide 918.3 (*)    All other components within normal limits  TROPONIN I - Abnormal; Notable for the following components:   Troponin I 0.07 (*)    All other components within normal limits  CBC WITH DIFFERENTIAL/PLATELET - Abnormal; Notable for the following components:   RBC 3.62 (*)    Hemoglobin 10.5 (*)    HCT 33.8 (*)    RDW 16.2 (*)    Platelets 116 (*)    All other components within normal limits    EKG  EKG Interpretation  Date/Time:  Friday August 22 2017 09:30:27 EST Ventricular Rate:  93 PR Interval:    QRS Duration: 140 QT Interval:  432 QTC Calculation: 538 R Axis:   -16 Text Interpretation:  Sinus rhythm Multiform ventricular premature complexes Left bundle branch block Abnormal ekg Confirmed by Carmin Muskrat [phone]) on 08/22/2017 11:07:49 AM Also confirmed by Carmin Muskrat [phone])  on 08/22/2017 11:11:41 AM       Radiology Dg Chest 2 View  Result Date: 08/22/2017 CLINICAL DATA:  Syncope EXAM:  CHEST  2 VIEW COMPARISON:  May 02, 2017 FINDINGS: There is no edema or consolidation. A monitor lead overlies area of questionable nodular opacity in the medial right upper lobe seen on prior study. Heart is upper normal in size with pulmonary vascularity within normal limits. Pacemaker leads are attached to the right atrium, right ventricle, and coronary sinus. No evident adenopathy. Patient is status post median sternotomy. No bone lesions. IMPRESSION: No edema or consolidation. Note that a monitor lead overlies the medial right upper lobe in the area of questionable nodular opacity seen on most recent study. It may be prudent to consider obtaining repeat frontal view chest with monitor leads removed. Stable cardiac silhouette. Electronically Signed   By: Lowella Grip III M.D.   On: 08/22/2017 12:20    Procedures Procedures (including critical care time)  Medications Ordered in ED Medications  furosemide (LASIX) injection 40 mg (not administered)  potassium chloride SA (K-DUR,KLOR-CON) CR tablet 60 mEq (60 mEq Oral Given 08/22/17 1326)     Initial Impression / Assessment and Plan / ED Course  I have reviewed the triage vital signs and the nursing notes.  Pertinent labs & imaging results that were available during my care of the patient were reviewed by me and considered in my medical decision making (see chart for details).     1:56 PM Patient in no distress.  Given concern for worsening congestive heart failure, syncope, and firing pacemaker I discussed his case with our cardiology colleagues.  Patient presents due to concern of pacemaker firing, syncope, and weight gain, with fatigue. The patient is awake and alert, given his history, substantial abnormalities, and firing pacemaker, patient required admission to our cardiology colleagues after provision of Lasix, potassium. Final Clinical Impressions(s) / ED Diagnoses  Hypokalemia Heart failure exacerbation   Carmin Muskrat, MD 08/22/17 1411

## 2017-08-23 ENCOUNTER — Other Ambulatory Visit: Payer: Self-pay

## 2017-08-23 DIAGNOSIS — I5042 Chronic combined systolic (congestive) and diastolic (congestive) heart failure: Secondary | ICD-10-CM

## 2017-08-23 DIAGNOSIS — I10 Essential (primary) hypertension: Secondary | ICD-10-CM

## 2017-08-23 DIAGNOSIS — I255 Ischemic cardiomyopathy: Secondary | ICD-10-CM

## 2017-08-23 DIAGNOSIS — N184 Chronic kidney disease, stage 4 (severe): Secondary | ICD-10-CM

## 2017-08-23 LAB — CBC
HCT: 36.8 % — ABNORMAL LOW (ref 39.0–52.0)
HEMOGLOBIN: 11.3 g/dL — AB (ref 13.0–17.0)
MCH: 28.8 pg (ref 26.0–34.0)
MCHC: 30.7 g/dL (ref 30.0–36.0)
MCV: 93.9 fL (ref 78.0–100.0)
PLATELETS: 114 10*3/uL — AB (ref 150–400)
RBC: 3.92 MIL/uL — ABNORMAL LOW (ref 4.22–5.81)
RDW: 16.6 % — ABNORMAL HIGH (ref 11.5–15.5)
WBC: 8.1 10*3/uL (ref 4.0–10.5)

## 2017-08-23 LAB — GLUCOSE, CAPILLARY
GLUCOSE-CAPILLARY: 150 mg/dL — AB (ref 65–99)
GLUCOSE-CAPILLARY: 155 mg/dL — AB (ref 65–99)
Glucose-Capillary: 171 mg/dL — ABNORMAL HIGH (ref 65–99)
Glucose-Capillary: 123 mg/dL — ABNORMAL HIGH (ref 65–99)

## 2017-08-23 LAB — BASIC METABOLIC PANEL
ANION GAP: 13 (ref 5–15)
BUN: 81 mg/dL — ABNORMAL HIGH (ref 6–20)
CALCIUM: 9 mg/dL (ref 8.9–10.3)
CO2: 27 mmol/L (ref 22–32)
CREATININE: 2.78 mg/dL — AB (ref 0.61–1.24)
Chloride: 94 mmol/L — ABNORMAL LOW (ref 101–111)
GFR calc Af Amer: 23 mL/min — ABNORMAL LOW (ref >60)
GFR calc non Af Amer: 20 mL/min — ABNORMAL LOW (ref >60)
Glucose, Bld: 150 mg/dL — ABNORMAL HIGH (ref 65–99)
Potassium: 3 mmol/L — ABNORMAL LOW (ref 3.5–5.1)
Sodium: 134 mmol/L — ABNORMAL LOW (ref 135–145)

## 2017-08-23 LAB — MRSA PCR SCREENING: MRSA BY PCR: NEGATIVE

## 2017-08-23 LAB — HEMOGLOBIN A1C
HEMOGLOBIN A1C: 6.6 % — AB (ref 4.8–5.6)
Mean Plasma Glucose: 142.72 mg/dL

## 2017-08-23 MED ORDER — POTASSIUM CHLORIDE 10 MEQ/100ML IV SOLN
10.0000 meq | INTRAVENOUS | Status: AC
  Administered 2017-08-23 (×4): 10 meq via INTRAVENOUS
  Filled 2017-08-23 (×3): qty 100

## 2017-08-23 MED ORDER — POTASSIUM CHLORIDE 10 MEQ/100ML IV SOLN
10.0000 meq | INTRAVENOUS | Status: DC
  Administered 2017-08-23: 10 meq via INTRAVENOUS
  Filled 2017-08-23: qty 100

## 2017-08-23 MED ORDER — DM-GUAIFENESIN ER 30-600 MG PO TB12
1.0000 | ORAL_TABLET | Freq: Two times a day (BID) | ORAL | Status: DC | PRN
  Administered 2017-08-23 – 2017-08-25 (×5): 1 via ORAL
  Filled 2017-08-23 (×5): qty 1

## 2017-08-23 MED ORDER — POTASSIUM CHLORIDE CRYS ER 20 MEQ PO TBCR
40.0000 meq | EXTENDED_RELEASE_TABLET | Freq: Two times a day (BID) | ORAL | Status: DC
  Administered 2017-08-23: 40 meq via ORAL
  Filled 2017-08-23: qty 2

## 2017-08-23 MED ORDER — ZOLPIDEM TARTRATE 5 MG PO TABS
10.0000 mg | ORAL_TABLET | Freq: Every evening | ORAL | Status: DC | PRN
  Administered 2017-08-23 – 2017-08-28 (×5): 10 mg via ORAL
  Filled 2017-08-23 (×5): qty 2

## 2017-08-23 MED ORDER — POTASSIUM CHLORIDE CRYS ER 20 MEQ PO TBCR
40.0000 meq | EXTENDED_RELEASE_TABLET | Freq: Every day | ORAL | Status: DC
  Administered 2017-08-24 – 2017-08-26 (×3): 40 meq via ORAL
  Filled 2017-08-23 (×3): qty 2

## 2017-08-23 NOTE — Progress Notes (Signed)
Received patient from ED, assessment completed, VS documented, oriented patient to the room.  CHG bath completed, MRSA swab sent, Will continue to monitor. 

## 2017-08-23 NOTE — Progress Notes (Addendum)
Placed on 1L nasal cannula for sat 87% while asleep, sats increased to 93%

## 2017-08-23 NOTE — Progress Notes (Signed)
Progress Note   Subjective   Doing well today, the patient denies CP or SOB.  No new concerns  Inpatient Medications    Scheduled Meds: . amLODipine  5 mg Oral Daily  . aspirin EC  81 mg Oral Daily  . atorvastatin  10 mg Oral Daily  . carvedilol  6.25 mg Oral BID WC  . enoxaparin (LOVENOX) injection  30 mg Subcutaneous Daily  . febuxostat  40 mg Oral Daily  . gabapentin  300 mg Oral QHS  . hydrALAZINE  50 mg Oral TID  . insulin aspart  0-5 Units Subcutaneous QHS  . insulin aspart  0-9 Units Subcutaneous TID WC  . loratadine  10 mg Oral Daily  . potassium chloride  40 mEq Oral BID  . torsemide  100 mg Oral Daily   Continuous Infusions: . amiodarone 30 mg/hr (08/23/17 0052)  . potassium chloride     PRN Meds: acetaminophen **OR** acetaminophen, albuterol, dextromethorphan-guaiFENesin, ondansetron **OR** ondansetron (ZOFRAN) IV, senna-docusate, zolpidem   Vital Signs    Vitals:   08/23/17 0100 08/23/17 0140 08/23/17 0414 08/23/17 0830  BP: 123/66  (!) 143/77 121/68  Pulse: 82  80 82  Resp: 17  17 20   Temp:   98.2 F (36.8 C) 98 F (36.7 C)  TempSrc:   Oral Oral  SpO2: 97%  92% 90%  Weight:  204 lb 12.9 oz (92.9 kg)    Height:  5\' 10"  (1.778 m)      Intake/Output Summary (Last 24 hours) at 08/23/2017 0840 Last data filed at 08/23/2017 0600 Gross per 24 hour  Intake 664.69 ml  Output 2025 ml  Net -1360.31 ml   Filed Weights   08/22/17 0936 08/23/17 0140  Weight: 203 lb (92.1 kg) 204 lb 12.9 oz (92.9 kg)    Telemetry    Sinus rhythm, nonsustained VT episodes observed - Personally Reviewed  Physical Exam   GEN- The patient is edlery, overweight, chronically ill appearing, alert and oriented x 3 today.   Head- normocephalic, atraumatic Eyes-  Sclera clear, conjunctiva pink Ears- hearing intact Oropharynx- clear Neck- supple, Lungs- Clear to ausculation bilaterally, normal work of breathing Heart- Regular rate and rhythm  GI- soft, NT, ND, +  BS Extremities- no clubbing, cyanosis, or edema  MS- no significant deformity or atrophy Skin- no rash or lesion Psych- euthymic mood, full affect Neuro- strength and sensation are intact   Labs    Chemistry Recent Labs  Lab 08/22/17 1139 08/22/17 2032 08/23/17 0348  NA 137 135 134*  K 2.6* 3.3* 3.0*  CL 96* 94* 94*  CO2 28 27 27   GLUCOSE 155* 233* 150*  BUN 86* 79* 81*  CREATININE 2.67* 2.58* 2.78*  CALCIUM 9.1 9.0 9.0  PROT 6.3*  --   --   ALBUMIN 3.3*  --   --   AST 17  --   --   ALT 17  --   --   ALKPHOS 58  --   --   BILITOT 1.1  --   --   GFRNONAA 21* 22* 20*  GFRAA 24* 25* 23*  ANIONGAP 13 14 13      Hematology Recent Labs  Lab 08/22/17 1139 08/23/17 0348  WBC 6.7 8.1  RBC 3.62* 3.92*  HGB 10.5* 11.3*  HCT 33.8* 36.8*  MCV 93.4 93.9  MCH 29.0 28.8  MCHC 31.1 30.7  RDW 16.2* 16.6*  PLT 116* 114*    Cardiac Enzymes Recent Labs  Lab 08/22/17 1139  TROPONINI 0.07*   No results for input(s): TROPIPOC in the last 168 hours.      Assessment & Plan    1.  Ventricular tachycardia Has been started on IV amiodarone by Dr Curt Bears Will plan to convert to oral amiodarone 200mg  BID tomorrow Keep K > 3.9, Mg > 1.9 Keep in step down another 24 hours  2. CAD/ ischemic CM No ischemic symptoms Will monitor closely Given renal function, would not plan cath at this time.  3. Chronic systolic dysfunction Mildly volume overloaded Continue current diuresis as renal function allows Replete K (primary team)  4. Stage IV renal failure Complicates cardiac management significantly  5. HTN Stable No change required today  6. Hypokalemia Primary team to replete Careful in the setting of renal failure  Keep in stepdown today Hopefully to telemetry bed tomorrow  Very complicated patient.  At risk for further arrhythmias/ decompensation.  A high level of decision making was required for this encounter.  Thompson Grayer MD, [facility] 08/23/2017 8:40 AM

## 2017-08-23 NOTE — Progress Notes (Signed)
PROGRESS NOTE    Shane [name]  QQV:[id number] DOB: [birth year] DOA: 08/22/2017 PCP: [name], MD   Brief Narrative:  84 year old male with history of chronic systolic heart failure, diabetes, hypertension, hyperlipidemia, CAD presented to the ER when he was obtained by shock from his ICD he was evaluated by cardiology and found to have serum potassium level of 2.6.    Assessment & Plan:   #Hypokalemia: Due to diuretics.  Serum potassium level 3 today.  Continue IV and oral potassium chloride.  Repeat lab in the morning.  Magnesium level acceptable.  #Ventricular tachycardia: Currently on IV amiodarone.  Plan to change to oral amiodarone by tomorrow by cardiology.  Continue to monitor in telemetry.  ICD interrogation per cardiology.  #Chronic systolic congestive heart failure: Looks euvolemic on exam.  Continue oral torsemide.  Monitor electrolytes.  No shortness of breath or chest pain.  #Chronic kidney disease stage IV: Serum creatinine level around baseline.  Monitor BMP.  Avoid nephrotoxins.  Does not have urinary symptoms.  #Essential hypertension: Stable.  Monitor blood pressure and continue current management.  On amlodipine, Coreg, hydralazine, torsemide.  #Syncope as per prior note: Likely in the setting of arrhythmia.  Management is stable.  TSH level acceptable.  DVT prophylaxis: Lovenox subcutaneous Code Status: Full code Family Communication: No family at bedside Disposition Plan: Stepdown    Consultants:   Cardiology  Procedures: None Antimicrobials: None  Subjective: Seen and examined at bedside.  Reported doing well.  Headache, dizziness, chest pain, shortness of breath.  Objective: Vitals:   08/23/17 0140 08/23/17 0414 08/23/17 0830 08/23/17 1210  BP:  (!) 143/77 121/68 127/65  Pulse:  80 82 83  Resp:  17 20 (!) 21  Temp:  98.2 F (36.8 C) 98 F (36.7 C) 98.7 F (37.1 C)  TempSrc:  Oral Oral Oral  SpO2:  92% 90% 93%  Weight: 92.9 kg (204 lb  12.9 oz)     Height: 5\' 10"  (1.778 m)       Intake/Output Summary (Last 24 hours) at 08/23/2017 1243 Last data filed at 08/23/2017 1221 Gross per 24 hour  Intake 1360.84 ml  Output 2225 ml  Net -864.16 ml   Filed Weights   08/22/17 0936 08/23/17 0140  Weight: 92.1 kg (203 lb) 92.9 kg (204 lb 12.9 oz)    Examination:  General exam: Appears calm and comfortable  Respiratory system: Clear to auscultation. Respiratory effort normal. No wheezing or crackle Cardiovascular system: S1 & S2 heard, RRR.  No pedal edema. Gastrointestinal system: Abdomen is nondistended, soft and nontender. Normal bowel sounds heard. Central nervous system: Alert and oriented. No focal neurological deficits. Extremities: Symmetric 5 x 5 power. Skin: No rashes, lesions or ulcers Psychiatry: Judgement and insight appear normal. Mood & affect appropriate.     Data Reviewed: I have personally reviewed following labs and imaging studies  CBC: Recent Labs  Lab 08/22/17 1139 08/23/17 0348  WBC 6.7 8.1  NEUTROABS 4.8  --   HGB 10.5* 11.3*  HCT 33.8* 36.8*  MCV 93.4 93.9  PLT 116* 332*   Basic Metabolic Panel: Recent Labs  Lab 08/22/17 1139 08/22/17 2032 08/23/17 0348  NA 137 135 134*  K 2.6* 3.3* 3.0*  CL 96* 94* 94*  CO2 28 27 27   GLUCOSE 155* 233* 150*  BUN 86* 79* 81*  CREATININE 2.67* 2.58* 2.78*  CALCIUM 9.1 9.0 9.0  MG 2.2  --   --    GFR: Estimated Creatinine Clearance: 23.5  mL/min (A) (by C-G formula based on SCr of 2.78 mg/dL (H)). Liver Function Tests: Recent Labs  Lab 08/22/17 1139  AST 17  ALT 17  ALKPHOS 58  BILITOT 1.1  PROT 6.3*  ALBUMIN 3.3*   No results for input(s): LIPASE, AMYLASE in the last 168 hours. No results for input(s): AMMONIA in the last 168 hours. Coagulation Profile: No results for input(s): INR, PROTIME in the last 168 hours. Cardiac Enzymes: Recent Labs  Lab 08/22/17 1139  TROPONINI 0.07*   BNP (last 3 results) No results for input(s):  PROBNP in the last 8760 hours. HbA1C: Recent Labs    08/23/17 0102  HGBA1C 6.6*   CBG: Recent Labs  Lab 08/22/17 1731 08/22/17 2217 08/23/17 0829 08/23/17 1210  GLUCAP 135* 207* 150* 171*   Lipid Profile: No results for input(s): CHOL, HDL, LDLCALC, TRIG, CHOLHDL, LDLDIRECT in the last 72 hours. Thyroid Function Tests: Recent Labs    08/22/17 2032  TSH 1.243   Anemia Panel: No results for input(s): VITAMINB12, FOLATE, FERRITIN, TIBC, IRON, RETICCTPCT in the last 72 hours. Sepsis Labs: No results for input(s): PROCALCITON, LATICACIDVEN in the last 168 hours.  Recent Results (from the past 240 hour(s))  MRSA PCR Screening     Status: None   Collection Time: 08/23/17  1:43 AM  Result Value Ref Range Status   MRSA by PCR NEGATIVE NEGATIVE Final    Comment:        The GeneXpert MRSA Assay (FDA approved for NASAL specimens only), is one component of a comprehensive MRSA colonization surveillance program. It is not intended to diagnose MRSA infection nor to guide or monitor treatment for MRSA infections.          Radiology Studies: Dg Chest 2 View  Result Date: 08/22/2017 CLINICAL DATA:  Syncope EXAM: CHEST  2 VIEW COMPARISON:  May 02, 2017 FINDINGS: There is no edema or consolidation. A monitor lead overlies area of questionable nodular opacity in the medial right upper lobe seen on prior study. Heart is upper normal in size with pulmonary vascularity within normal limits. Pacemaker leads are attached to the right atrium, right ventricle, and coronary sinus. No evident adenopathy. Patient is status post median sternotomy. No bone lesions. IMPRESSION: No edema or consolidation. Note that a monitor lead overlies the medial right upper lobe in the area of questionable nodular opacity seen on most recent study. It may be prudent to consider obtaining repeat frontal view chest with monitor leads removed. Stable cardiac silhouette. Electronically Signed   By: Lowella Grip III M.D.   On: 08/22/2017 12:20        Scheduled Meds: . amLODipine  5 mg Oral Daily  . aspirin EC  81 mg Oral Daily  . atorvastatin  10 mg Oral Daily  . carvedilol  6.25 mg Oral BID WC  . enoxaparin (LOVENOX) injection  30 mg Subcutaneous Daily  . febuxostat  40 mg Oral Daily  . gabapentin  300 mg Oral QHS  . hydrALAZINE  50 mg Oral TID  . insulin aspart  0-5 Units Subcutaneous QHS  . insulin aspart  0-9 Units Subcutaneous TID WC  . loratadine  10 mg Oral Daily  . [START ON 08/24/2017] potassium chloride  40 mEq Oral Daily  . torsemide  100 mg Oral Daily   Continuous Infusions: . amiodarone 30 mg/hr (08/23/17 0052)  . potassium chloride 10 mEq (08/23/17 1221)     LOS: 1 day    Dron Tanna Furry, MD  [facility] Pager [phone]  If 7PM-7AM, please contact night-coverage [email or web address] Password [facility] 08/23/2017, 12:43 PM

## 2017-08-23 NOTE — Progress Notes (Addendum)
Notified MD of patient 03:48 potassium level of 3.0 and patient having small beat runs of vtach at times. Patient is asymptomatic. Orders received

## 2017-08-24 DIAGNOSIS — I25119 Atherosclerotic heart disease of native coronary artery with unspecified angina pectoris: Secondary | ICD-10-CM

## 2017-08-24 LAB — RENAL FUNCTION PANEL
Albumin: 3.1 g/dL — ABNORMAL LOW (ref 3.5–5.0)
Anion gap: 15 (ref 5–15)
BUN: 76 mg/dL — AB (ref 6–20)
CALCIUM: 8.6 mg/dL — AB (ref 8.9–10.3)
CO2: 24 mmol/L (ref 22–32)
CREATININE: 2.69 mg/dL — AB (ref 0.61–1.24)
Chloride: 94 mmol/L — ABNORMAL LOW (ref 101–111)
GFR calc Af Amer: 24 mL/min — ABNORMAL LOW (ref >60)
GFR calc non Af Amer: 21 mL/min — ABNORMAL LOW (ref >60)
GLUCOSE: 134 mg/dL — AB (ref 65–99)
Phosphorus: 2.9 mg/dL (ref 2.5–4.6)
Potassium: 3.5 mmol/L (ref 3.5–5.1)
SODIUM: 133 mmol/L — AB (ref 135–145)

## 2017-08-24 LAB — GLUCOSE, CAPILLARY
GLUCOSE-CAPILLARY: 156 mg/dL — AB (ref 65–99)
Glucose-Capillary: 163 mg/dL — ABNORMAL HIGH (ref 65–99)
Glucose-Capillary: 222 mg/dL — ABNORMAL HIGH (ref 65–99)
Glucose-Capillary: 216 mg/dL — ABNORMAL HIGH (ref 65–99)

## 2017-08-24 MED ORDER — AMIODARONE HCL 200 MG PO TABS
200.0000 mg | ORAL_TABLET | Freq: Two times a day (BID) | ORAL | Status: DC
  Administered 2017-08-24 – 2017-08-28 (×9): 200 mg via ORAL
  Filled 2017-08-24 (×9): qty 1

## 2017-08-24 NOTE — Progress Notes (Signed)
Progress Note   Subjective   Doing well today, the patient denies CP or SOB.  No new concerns  Inpatient Medications    Scheduled Meds: . amLODipine  5 mg Oral Daily  . aspirin EC  81 mg Oral Daily  . atorvastatin  10 mg Oral Daily  . carvedilol  6.25 mg Oral BID WC  . enoxaparin (LOVENOX) injection  30 mg Subcutaneous Daily  . febuxostat  40 mg Oral Daily  . gabapentin  300 mg Oral QHS  . hydrALAZINE  50 mg Oral TID  . insulin aspart  0-5 Units Subcutaneous QHS  . insulin aspart  0-9 Units Subcutaneous TID WC  . loratadine  10 mg Oral Daily  . potassium chloride  40 mEq Oral Daily  . torsemide  100 mg Oral Daily   Continuous Infusions: . amiodarone 30 mg/hr (08/24/17 0600)   PRN Meds: acetaminophen **OR** acetaminophen, albuterol, dextromethorphan-guaiFENesin, ondansetron **OR** ondansetron (ZOFRAN) IV, senna-docusate, zolpidem   Vital Signs    Vitals:   08/23/17 1935 08/23/17 2324 08/24/17 0335 08/24/17 0624  BP: 124/73 119/73 131/79   Pulse: 88 85 84   Resp: 20 (!) 23 (!) 21   Temp: 98.5 F (36.9 C) 98.3 F (36.8 C) 98.2 F (36.8 C)   TempSrc: Oral Oral Oral   SpO2: 93% 92% 94%   Weight:    203 lb 0.7 oz (92.1 kg)  Height:        Intake/Output Summary (Last 24 hours) at 08/24/2017 0810 Last data filed at 08/24/2017 0600 Gross per 24 hour  Intake 1800.92 ml  Output 725 ml  Net 1075.92 ml   Filed Weights   08/22/17 0936 08/23/17 0140 08/24/17 0624  Weight: 203 lb (92.1 kg) 204 lb 12.9 oz (92.9 kg) 203 lb 0.7 oz (92.1 kg)    Telemetry    Sinus with V pacing, ventricular ectopy is much improved - Personally Reviewed  Physical Exam   GEN- The patient is elderly appearing, sleeping but rouses Head- normocephalic, atraumatic Eyes-  Sclera clear, conjunctiva pink Ears- hearing intact Oropharynx- clear Neck- supple, Lungs- Clear to ausculation bilaterally, normal work of breathing Heart- Regular rate and rhythm  GI- soft, NT, ND, + BS Extremities-  no clubbing, cyanosis, + dependant edema  MS- no significant deformity or atrophy Skin- no rash or lesion Psych- euthymic mood, full affect Neuro- strength and sensation are intact   Labs    Chemistry Recent Labs  Lab 08/22/17 1139 08/22/17 2032 08/23/17 0348 08/24/17 0332  NA 137 135 134* 133*  K 2.6* 3.3* 3.0* 3.5  CL 96* 94* 94* 94*  CO2 28 27 27 24   GLUCOSE 155* 233* 150* 134*  BUN 86* 79* 81* 76*  CREATININE 2.67* 2.58* 2.78* 2.69*  CALCIUM 9.1 9.0 9.0 8.6*  PROT 6.3*  --   --   --   ALBUMIN 3.3*  --   --  3.1*  AST 17  --   --   --   ALT 17  --   --   --   ALKPHOS 58  --   --   --   BILITOT 1.1  --   --   --   GFRNONAA 21* 22* 20* 21*  GFRAA 24* 25* 23* 24*  ANIONGAP 13 14 13 15      Hematology Recent Labs  Lab 08/22/17 1139 08/23/17 0348  WBC 6.7 8.1  RBC 3.62* 3.92*  HGB 10.5* 11.3*  HCT 33.8* 36.8*  MCV 93.4 93.9  MCH 29.0 28.8  MCHC 31.1 30.7  RDW 16.2* 16.6*  PLT 116* 114*    Cardiac Enzymes Recent Labs  Lab 08/22/17 1139  TROPONINI 0.07*   No results for input(s): TROPIPOC in the last 168 hours.      Assessment & Plan    1.  Ventricular tachycardia Much improved Will convert amiodarone to oral today Keep K > 3.9 and Mg > 1.9  2. CAD/ ischemic CM No ischemic symptoms Volume management is difficult with renal failure  3. Chronic systolic dysfunction Continue current diuresis  4. Stage IV renal failure Stable No change required today  5. HTN Stable No change required today  6. Hypokalemia Improving slowly  Transfer to telemetry today Hopefully home in the next 48 hours Would begin ambulation today/ tomorrow  Thompson Grayer MD, [facility] 08/24/2017 8:10 AM

## 2017-08-24 NOTE — Progress Notes (Signed)
Pharmacist Heart Failure Core Measure Documentation  Assessment: Shane [name] has an EF documented as 40% on echo.  Rationale: Heart failure patients with left ventricular systolic dysfunction (LVSD) and an EF < 40% should be prescribed an angiotensin converting enzyme inhibitor (ACEI) or angiotensin receptor blocker (ARB) at discharge unless a contraindication is documented in the medical record.  This patient is not currently on an ACEI or ARB for HF.  This note is being placed in the record in order to provide documentation that a contraindication to the use of these agents is present for this encounter.  ACE Inhibitor or Angiotensin Receptor Blocker is contraindicated (specify all that apply)  []   ACEI allergy AND ARB allergy []   Angioedema []   Moderate or severe aortic stenosis []   Hyperkalemia []   Hypotension []   Renal artery stenosis [x]   Worsening renal function, preexisting renal disease or dysfunction   Georgina Peer 08/24/2017 2:53 PM

## 2017-08-24 NOTE — Progress Notes (Signed)
PROGRESS NOTE    Shane [name]  ZOX:[id number] DOB: 09-24-1933 DOA: 08/22/2017 PCP: [name], MD   Brief Narrative:  84 year old male with history of chronic systolic heart failure, diabetes, hypertension, hyperlipidemia, CAD presented to the ER when he was obtained by shock from his ICD he was evaluated by cardiology and found to have serum potassium level of 2.6.    Assessment & Plan:   #Hypokalemia: Due to diuretics.  Serum potassium level 3.5 today.  Continue oral potassium supplement.  Monitor lab.    #Ventricular tachycardia: Switching to oral amiodarone by cardiology today.  Continue to monitor in telemetry.  Cardiology consult appreciated.  Patient is asymptomatic. ICD interrogation per cardiology.  #Chronic systolic congestive heart failure: Looks euvolemic on exam.  Continue oral torsemide.  Monitor electrolytes.  No shortness of breath or chest pain.  #Acute respiratory failure with hypoxia: Currently on 2 L of oxygen.  Try to wean down today.  Chest x-ray with no edema.  Try to wean down to room air.  Discussed with the nurse.  #Chronic kidney disease stage IV: Serum creatinine level around baseline.  Monitor BMP.  Avoid nephrotoxins.  Does not have urinary symptoms.  #Essential hypertension: Stable.  Monitor blood pressure and continue current management.  On amlodipine, Coreg, hydralazine, torsemide.  #Syncope as per prior note: Likely in the setting of arrhythmia.  Management is stable.  TSH level acceptable. PT, OT evaluation requested Case manager referral.  DVT prophylaxis: Lovenox subcutaneous Code Status: Full code Family Communication: No family at bedside Disposition Plan: Transferring to telemetry floor.    Consultants:   Cardiology  Procedures: None Antimicrobials: None  Subjective: Seen and examined at bedside.  No new event.  Reported doing well.  Denies headache, dizziness, nausea, vomiting, chest pain, shortness of  breath. Objective: Vitals:   08/24/17 0335 08/24/17 0624 08/24/17 0814 08/24/17 1205  BP: 131/79  129/85 (!) 108/51  Pulse: 84  88 78  Resp: (!) 21  (!) 22 (!) 26  Temp: 98.2 F (36.8 C)  98.2 F (36.8 C) 99.4 F (37.4 C)  TempSrc: Oral  Oral Oral  SpO2: 94%  96% 95%  Weight:  92.1 kg (203 lb 0.7 oz)    Height:        Intake/Output Summary (Last 24 hours) at 08/24/2017 1320 Last data filed at 08/24/2017 1047 Gross per 24 hour  Intake 1172.41 ml  Output 525 ml  Net 647.41 ml   Filed Weights   08/22/17 0936 08/23/17 0140 08/24/17 0624  Weight: 92.1 kg (203 lb) 92.9 kg (204 lb 12.9 oz) 92.1 kg (203 lb 0.7 oz)    Examination:  General exam: Sitting in bed comfortable, not in distress Respiratory system: Clear bilateral, respiratory for normal.  No wheezing or crackle. Cardiovascular system: Regular rate rhythm S1-S2 normal.  No pedal edema. Gastrointestinal system: Abdomen soft nontender.  Nondistended. Central nervous system: Alert awake and following commands. Skin: No rashes, lesions or ulcers Psychiatry: Judgement and insight appear normal. Mood & affect appropriate.     Data Reviewed: I have personally reviewed following labs and imaging studies  CBC: Recent Labs  Lab 08/22/17 1139 08/23/17 0348  WBC 6.7 8.1  NEUTROABS 4.8  --   HGB 10.5* 11.3*  HCT 33.8* 36.8*  MCV 93.4 93.9  PLT 116* 811*   Basic Metabolic Panel: Recent Labs  Lab 08/22/17 1139 08/22/17 2032 08/23/17 0348 08/24/17 0332  NA 137 135 134* 133*  K 2.6* 3.3* 3.0* 3.5  CL 96* 94* 94* 94*  CO2 28 27 27 24   GLUCOSE 155* 233* 150* 134*  BUN 86* 79* 81* 76*  CREATININE 2.67* 2.58* 2.78* 2.69*  CALCIUM 9.1 9.0 9.0 8.6*  MG 2.2  --   --   --   PHOS  --   --   --  2.9   GFR: Estimated Creatinine Clearance: 24.1 mL/min (A) (by C-G formula based on SCr of 2.69 mg/dL (H)). Liver Function Tests: Recent Labs  Lab 08/22/17 1139 08/24/17 0332  AST 17  --   ALT 17  --   ALKPHOS 58  --    BILITOT 1.1  --   PROT 6.3*  --   ALBUMIN 3.3* 3.1*   No results for input(s): LIPASE, AMYLASE in the last 168 hours. No results for input(s): AMMONIA in the last 168 hours. Coagulation Profile: No results for input(s): INR, PROTIME in the last 168 hours. Cardiac Enzymes: Recent Labs  Lab 08/22/17 1139  TROPONINI 0.07*   BNP (last 3 results) No results for input(s): PROBNP in the last 8760 hours. HbA1C: Recent Labs    08/23/17 0102  HGBA1C 6.6*   CBG: Recent Labs  Lab 08/23/17 1210 08/23/17 1659 08/23/17 2145 08/24/17 0818 08/24/17 1204  GLUCAP 171* 123* 155* 163* 222*   Lipid Profile: No results for input(s): CHOL, HDL, LDLCALC, TRIG, CHOLHDL, LDLDIRECT in the last 72 hours. Thyroid Function Tests: Recent Labs    08/22/17 2032  TSH 1.243   Anemia Panel: No results for input(s): VITAMINB12, FOLATE, FERRITIN, TIBC, IRON, RETICCTPCT in the last 72 hours. Sepsis Labs: No results for input(s): PROCALCITON, LATICACIDVEN in the last 168 hours.  Recent Results (from the past 240 hour(s))  MRSA PCR Screening     Status: None   Collection Time: 08/23/17  1:43 AM  Result Value Ref Range Status   MRSA by PCR NEGATIVE NEGATIVE Final    Comment:        The GeneXpert MRSA Assay (FDA approved for NASAL specimens only), is one component of a comprehensive MRSA colonization surveillance program. It is not intended to diagnose MRSA infection nor to guide or monitor treatment for MRSA infections.          Radiology Studies: No results found.      Scheduled Meds: . amiodarone  200 mg Oral BID  . amLODipine  5 mg Oral Daily  . aspirin EC  81 mg Oral Daily  . atorvastatin  10 mg Oral Daily  . carvedilol  6.25 mg Oral BID WC  . enoxaparin (LOVENOX) injection  30 mg Subcutaneous Daily  . febuxostat  40 mg Oral Daily  . gabapentin  300 mg Oral QHS  . hydrALAZINE  50 mg Oral TID  . insulin aspart  0-5 Units Subcutaneous QHS  . insulin aspart  0-9 Units  Subcutaneous TID WC  . loratadine  10 mg Oral Daily  . potassium chloride  40 mEq Oral Daily  . torsemide  100 mg Oral Daily   Continuous Infusions:    LOS: 2 days    Shawndell Varas Tanna Furry, MD [facility] Pager [phone]  If 7PM-7AM, please contact night-coverage [email or web address] Password [email or web address] 08/24/2017, 1:20 PM

## 2017-08-25 ENCOUNTER — Ambulatory Visit: Payer: Medicare Other | Admitting: Adult Health

## 2017-08-25 ENCOUNTER — Inpatient Hospital Stay (HOSPITAL_COMMUNITY): Payer: Medicare Other

## 2017-08-25 LAB — MAGNESIUM: Magnesium: 2 mg/dL (ref 1.7–2.4)

## 2017-08-25 LAB — GLUCOSE, CAPILLARY
Glucose-Capillary: 120 mg/dL — ABNORMAL HIGH (ref 65–99)
Glucose-Capillary: 185 mg/dL — ABNORMAL HIGH (ref 65–99)
Glucose-Capillary: 117 mg/dL — ABNORMAL HIGH (ref 65–99)
Glucose-Capillary: 152 mg/dL — ABNORMAL HIGH (ref 65–99)

## 2017-08-25 LAB — URINALYSIS, ROUTINE W REFLEX MICROSCOPIC
Bilirubin Urine: NEGATIVE
GLUCOSE, UA: NEGATIVE mg/dL
HGB URINE DIPSTICK: NEGATIVE
Ketones, ur: NEGATIVE mg/dL
Leukocytes, UA: NEGATIVE
NITRITE: NEGATIVE
Protein, ur: 100 mg/dL — AB
SPECIFIC GRAVITY, URINE: 1.012 (ref 1.005–1.030)
Squamous Epithelial / LPF: NONE SEEN
pH: 5 (ref 5.0–8.0)

## 2017-08-25 LAB — RENAL FUNCTION PANEL
ANION GAP: 13 (ref 5–15)
Albumin: 2.9 g/dL — ABNORMAL LOW (ref 3.5–5.0)
BUN: 83 mg/dL — ABNORMAL HIGH (ref 6–20)
CO2: 25 mmol/L (ref 22–32)
Calcium: 8.2 mg/dL — ABNORMAL LOW (ref 8.9–10.3)
Chloride: 95 mmol/L — ABNORMAL LOW (ref 101–111)
Creatinine, Ser: 3.13 mg/dL — ABNORMAL HIGH (ref 0.61–1.24)
GFR calc Af Amer: 20 mL/min — ABNORMAL LOW (ref >60)
GFR calc non Af Amer: 17 mL/min — ABNORMAL LOW (ref >60)
GLUCOSE: 117 mg/dL — AB (ref 65–99)
POTASSIUM: 3.6 mmol/L (ref 3.5–5.1)
Phosphorus: 3.2 mg/dL (ref 2.5–4.6)
SODIUM: 133 mmol/L — AB (ref 135–145)

## 2017-08-25 MED ORDER — AMOXICILLIN-POT CLAVULANATE 500-125 MG PO TABS
1.0000 | ORAL_TABLET | Freq: Two times a day (BID) | ORAL | Status: DC
  Administered 2017-08-25 – 2017-08-28 (×7): 500 mg via ORAL
  Filled 2017-08-25 (×8): qty 1

## 2017-08-25 MED ORDER — BENZONATATE 100 MG PO CAPS
100.0000 mg | ORAL_CAPSULE | Freq: Two times a day (BID) | ORAL | Status: DC
Start: 2017-08-25 — End: 2017-08-28
  Administered 2017-08-25 – 2017-08-28 (×7): 100 mg via ORAL
  Filled 2017-08-25 (×7): qty 1

## 2017-08-25 MED ORDER — TORSEMIDE 20 MG PO TABS: 100.0000 mg | ORAL_TABLET | Freq: Every day | ORAL | Status: DC

## 2017-08-25 MED ORDER — DIPHENHYDRAMINE HCL 25 MG PO CAPS
25.0000 mg | ORAL_CAPSULE | ORAL | Status: DC | PRN
  Administered 2017-08-25: 25 mg via ORAL
  Filled 2017-08-25: qty 1

## 2017-08-25 NOTE — Evaluation (Signed)
Physical Therapy Evaluation Patient Details Name: Shane [name] MRN: [id number] DOB: 04-01-1934 Today's Date: 08/25/2017   History of Present Illness  Pt is an 84 y.o. male with PMH significant for CHF, DM, HTN, HLD, and CAD, who presents to ED on 08/22/17 after he was awakened by shock from his ICD. Was evaluated by cardiology who recommended medical admission due to a potassium level of 2.6.    Clinical Impression  Pt presents with an overall decrease in functional mobility secondary to above. PTA, pt mod indep with SPC and lives at home with wife. Today, required close min guard ambulating with single UE support; stability improved with use of RW and supervision for balance. SpO2 down to 87% on RA; >90% on 1L O2 [place]. Pt would benefit from continued acute PT services to maximize functional mobility and independence prior to d/c with HHPT services.     Follow Up Recommendations Home health PT;Supervision - Intermittent    Equipment Recommendations  None recommended by PT    Recommendations for Other Services       Precautions / Restrictions Precautions Precautions: Fall Precaution Comments: monitor O2 sats Required Braces or Orthoses: Other Brace/Splint Other Brace/Splint: lace up brace for LLE (not here), wears a pull on brace for left knee Restrictions Weight Bearing Restrictions: No      Mobility  Bed Mobility Overal bed mobility: Independent             General bed mobility comments: Received sitting in recliner  Transfers Overall transfer level: Needs assistance Equipment used: None;Rolling walker (2 wheeled) Transfers: Sit to/from Stand Sit to Stand: Min guard         General transfer comment: Stood with no DME, relying on single UE support once standing to maintain balance. Able to sit<>stand with RW and good technique  Ambulation/Gait Ambulation/Gait assistance: Min guard;Supervision Ambulation Distance (Feet): 200 Feet Assistive device: None;Rolling  walker (2 wheeled);1 person hand held assist Gait Pattern/deviations: Step-through pattern;Decreased stride length Gait velocity: Decreased Gait velocity interpretation: <1.8 ft/sec, indicative of risk for recurrent falls General Gait Details: Unsteady amb with no DME, with pt furniture surfing for single UE support; close min guard for balance. Stability improved with BUE support on RW, only requiring supervision for safety  Stairs            Wheelchair Mobility    Modified Rankin (Stroke Patients Only)       Balance Overall balance assessment: Needs assistance Sitting-balance support: Feet supported;No upper extremity supported Sitting balance-Leahy Scale: Good     Standing balance support: Single extremity supported Standing balance-Leahy Scale: Poor Standing balance comment: Reliant on UE support                             Pertinent Vitals/Pain Pain Assessment: No/denies pain    Home Living Family/patient expects to be discharged to:: Private residence Living Arrangements: Spouse/significant other Available Help at Discharge: Family(wife currently in hospital) Type of Home: House Home Access: Stairs to enter Entrance Stairs-Rails: None Entrance Stairs-Number of Steps: 1 Home Layout: One level;Laundry or work area in [place]: Environmental consultant - 2 wheels;Cane - single point;Bedside commode;Grab bars - tub/shower;Hand held shower head;Shower seat - built in;Wheelchair - manual Additional Comments: Wife currently in hospital, but usually available 24/7    Prior Function Level of Independence: Independent with assistive device(s)         Comments: Endorses furniture at surfing with no DME. Uses  SPC for community ambulation. Drives     Hand Dominance   Dominant Hand: Right    Extremity/Trunk Assessment   Upper Extremity Assessment Upper Extremity Assessment: Overall WFL for tasks assessed    Lower Extremity Assessment Lower Extremity  Assessment: Overall WFL for tasks assessed       Communication   Communication: No difficulties  Cognition Arousal/Alertness: Awake/alert Behavior During Therapy: WFL for tasks assessed/performed Overall Cognitive Status: Within Functional Limits for tasks assessed                                        General Comments      Exercises     Assessment/Plan    PT Assessment Patient needs continued PT services  PT Problem List Decreased activity tolerance;Decreased balance;Decreased mobility       PT Treatment Interventions DME instruction;Gait training;Stair training;Functional mobility training;Therapeutic activities;Therapeutic exercise;Balance training;Patient/family education    PT Goals (Current goals can be found in the Care Plan section)  Acute Rehab PT Goals Patient Stated Goal: Return home PT Goal Formulation: With patient Time For Goal Achievement: 09/08/17 Potential to Achieve Goals: Good    Frequency Min 3X/week   Barriers to discharge        Co-evaluation               AM-PAC PT "6 Clicks" Daily Activity  Outcome Measure Difficulty turning over in bed (including adjusting bedclothes, sheets and blankets)?: None Difficulty moving from lying on back to sitting on the side of the bed? : None Difficulty sitting down on and standing up from a chair with arms (e.g., wheelchair, bedside commode, etc,.)?: A Little Help needed moving to and from a bed to chair (including a wheelchair)?: A Little Help needed walking in hospital room?: A Little Help needed climbing 3-5 steps with a railing? : A Little 6 Click Score: 20    End of Session Equipment Utilized During Treatment: Gait belt;Oxygen Activity Tolerance: Patient tolerated treatment well Patient left: in chair;with call bell/phone within reach;with nursing/sitter in room Nurse Communication: Mobility status PT Visit Diagnosis: Other abnormalities of gait and mobility (R26.89)     Time: 2836-6294 PT Time Calculation (min) (ACUTE ONLY): 24 min   Charges:   PT Evaluation $PT Eval Moderate Complexity: 1 Mod PT Treatments $Gait Training: 8-22 mins   PT G Codes:       Mabeline Caras, PT, DPT Acute Rehab Services  Pager: [place] 08/25/2017, 11:58 AM

## 2017-08-25 NOTE — Progress Notes (Signed)
Pt escorted by NT via wheelchair on tele to visit wife admitted to [facility]. Pt returned from spouse visit at 1520. VSS. Pt pastor at bedside, pt sitting in recliner. Will continue to monitor.

## 2017-08-25 NOTE — Evaluation (Signed)
Occupational Therapy Evaluation Patient Details Name: Shane [name] MRN: [id number] DOB: 1934/04/27 Today's Date: 08/25/2017    History of Present Illness CARDALE DORER is a very pleasant 84 y.o. male with medical history significant chronic systolic heart failure, diabetes, hypertension, hyperlipidemia CAD presents to the emergency department after he was awakened by shock from his ICD. Was evaluated by cardiology who recommended medical admission due to a potassium level of 2.6.   Clinical Impression   This 84 yo male admitted with above presents to acute OT with drop in O2 sats on RA with activity (84% with ambulation) and noted coughing with with taking sips of water. He still feels week and feels more secure with +1 HHA. He will benefit from acute OT with follow up [place] to get back to his PLOF of Mod I to independent level.     Follow Up Recommendations  Home health OT;Supervision - Intermittent    Equipment Recommendations  None recommended by OT       Precautions / Restrictions Precautions Precautions: Fall Precaution Comments: monitor O2 sats Required Braces or Orthoses: Other Brace/Splint Other Brace/Splint: lace up brace for LLE (not here), wears a pull on brace for left knee Restrictions Weight Bearing Restrictions: No      Mobility Bed Mobility Overal bed mobility: Independent                Transfers Overall transfer level: Needs assistance   Transfers: Sit to/from Stand Sit to Stand: Min guard         General transfer comment: ambulation in hallway with single hand held A    Balance Overall balance assessment: Needs assistance Sitting-balance support: Feet supported;No upper extremity supported Sitting balance-Leahy Scale: Good     Standing balance support: Single extremity supported Standing balance-Leahy Scale: Poor Standing balance comment: reliant on HHA                           ADL either performed or assessed with  clinical judgement   ADL Overall ADL's : Needs assistance/impaired                                       General ADL Comments: overall min A for all basic ADLs from a single hand held A. Educated pt on purse lipped breathing. Educated that if he does go home on O2 he need to wear it in shower and he needs to use his shower seat to sit on to conserve his energy and help his breathing     Vision Patient Visual Report: No change from baseline              Pertinent Vitals/Pain Pain Assessment: No/denies pain     Hand Dominance Right   Extremity/Trunk Assessment Upper Extremity Assessment Upper Extremity Assessment: Overall WFL for tasks assessed   Lower Extremity Assessment Lower Extremity Assessment: Defer to PT evaluation       Communication Communication Communication: No difficulties   Cognition Arousal/Alertness: Awake/alert Behavior During Therapy: WFL for tasks assessed/performed Overall Cognitive Status: Within Functional Limits for tasks assessed                                                Home Living  Family/patient expects to be discharged to:: Private residence Living Arrangements: Spouse/significant other Available Help at Discharge: Family(wife currently in hospital) Type of Home: Gates to enter [employer] of Steps: 1 Entrance Stairs-Rails: None Home Layout: One level;Laundry or work area in basement     [employer] Shower/Tub: Hospital doctor Toilet: Handicapped [place]: Environmental consultant - 2 wheels;Cane - single point;Bedside commode;Grab bars - tub/shower;Hand held shower head;Shower seat - built in;Wheelchair - manual          Prior Functioning/Environment Level of Independence: Independent        Comments: community ambulator with SPC--in house does not use AD, uses grocery cart for support, driver         OT Problem List: Cardiopulmonary status  limiting activity;Impaired balance (sitting and/or standing)      OT Treatment/Interventions: Self-care/ADL training;Energy conservation;Patient/family education;Balance training    OT Goals(Current goals can be found in the care plan section) Acute Rehab OT Goals Patient Stated Goal: to get to feeling even better and go home OT Goal Formulation: With patient Time For Goal Achievement: 09/01/17 Potential to Achieve Goals: Good  OT Frequency: Min 2X/week              AM-PAC PT "6 Clicks" Daily Activity     Outcome Measure Help from another person eating meals?: None Help from another person taking care of personal grooming?: A Little Help from another person toileting, which includes using toliet, bedpan, or urinal?: A Little Help from another person bathing (including washing, rinsing, drying)?: A Little Help from another person to put on and taking off regular upper body clothing?: A Little Help from another person to put on and taking off regular lower body clothing?: A Little 6 Click Score: 19   End of Session Equipment Utilized During Treatment: Gait belt Nurse Communication: Mobility status(drop in O2 sats and coughed x2 with taking sips of water ~4 minutes apart)  Activity Tolerance: (Limited by drop in O2 sats) Patient left: in chair;with call bell/phone within reach;with chair alarm set  OT Visit Diagnosis: Unsteadiness on feet (R26.81);Other abnormalities of gait and mobility (R26.89)                Time: 1000-1025 OT Time Calculation (min): 25 min Charges:  OT General Charges $OT Visit: 1 Visit OT Evaluation $OT Eval Moderate Complexity: 1 Mod OT Treatments $Self Care/Home Management : 8-22 mins   Golden Circle, OTR/L [phone] 08/25/2017

## 2017-08-25 NOTE — Care Management Note (Addendum)
Case Management Note  Patient Details  Name: Shane [name] MRN: [id number] Date of Birth: 1934/05/14  Subjective/Objective:    Pt admitted post shock from ICD                Action/Plan:  PTA from home independent.  Pt given choice for [facility] - pt chose [facility] accepted referral.  CM requested HH order and face to face from attending   Expected Discharge Date:                  Expected Discharge Plan:  [place]  In-House Referral:     Discharge planning Services  CM Consult  Post Acute Care Choice:    Choice offered to:  Patient  DME Arranged:    DME Agency:     HH Arranged:  PT, OT HH Agency:  [facility] (now [facility])  Status of Service:  In process, will continue to follow  If discussed at Long Length of Stay Meetings, dates discussed:    Additional Comments:  Maryclare Labrador, RN 08/25/2017, 3:31 PM

## 2017-08-25 NOTE — Progress Notes (Signed)
PROGRESS NOTE    Shane [name]  ZOX:[id number] DOB: May 12, 1934 DOA: 08/22/2017 PCP: [name], MD   Brief Narrative:  84 year old male with history of chronic systolic heart failure, diabetes, hypertension, hyperlipidemia, CAD presented to the ER when he was obtained by shock from his ICD he was evaluated by cardiology and found to have serum potassium level of 2.6.    Assessment & Plan:   #Hypokalemia: Due to diuretics.  Serum potassium level 3.6 today.  Continue oral potassium supplement.  Monitor lab.    #Ventricular tachycardia: Switched  to oral amiodarone by cardiology.  Continue to monitor in telemetry.  Cardiology consult appreciated.  Patient is asymptomatic. ICD interrogation per cardiology.  #Chronic systolic congestive heart failure: Looks euvolemic on exam.  Holding torsemide today because of elevated creatinine.  Monitor electrolytes.  No shortness of breath or chest pain.  #Acute respiratory failure with hypoxia: Currently on 2 L of oxygen.  Try to wean.  Patient reported cough.  Repeat chest x-ray with edema versus interstitial disease.  MRSA PCR negative.  Starting Augmentin.  Patient is allergic to azithromycin.  Continue bronchodilators.  #Acute on chronic kidney disease stage IV: Elevated serum creatinine level likely hemodynamically mediated.  Holding today's torsemide.  Monitor BMP.  Discussed with the patient.  Monitor urine output.  Does not have urinary symptoms. -Check UA.  #Essential hypertension: Stable.  Monitor blood pressure and continue current management.  On amlodipine, Coreg, hydralazine, torsemide.  #Syncope as per prior note: Likely in the setting of arrhythmia.  Management is stable.  TSH level acceptable. PT, OT evaluation requested Case manager referral.  DVT prophylaxis: Lovenox subcutaneous Code Status: Full code Family Communication: No family at bedside Disposition Plan: telemetry floor.    Consultants:    Cardiology  Procedures: None Antimicrobials: None  Subjective: Seen and examined at bedside.  Has coughing spell.  Denies chest pain, shortness of breath, nausea or vomiting.  No headache or dizziness. Objective: Vitals:   08/24/17 2353 08/25/17 0416 08/25/17 0847 08/25/17 1039  BP: 118/79 121/66 132/78   Pulse: 80 81 87   Resp: 20 (!) 22 (!) 21   Temp:   98.1 F (36.7 C)   TempSrc:  Oral Oral   SpO2: 98% 96% 96% (!) 84%  Weight:  92.8 kg (204 lb 9.4 oz)    Height:        Intake/Output Summary (Last 24 hours) at 08/25/2017 1108 Last data filed at 08/24/2017 1300 Gross per 24 hour  Intake 240 ml  Output -  Net 240 ml   Filed Weights   08/23/17 0140 08/24/17 0624 08/25/17 0416  Weight: 92.9 kg (204 lb 12.9 oz) 92.1 kg (203 lb 0.7 oz) 92.8 kg (204 lb 9.4 oz)    Examination:  General exam: Lying in bed comfortable, not in distress Respiratory system: Bibasilar rhonchi, respiratory for normal.  No wheezing cardiovascular system: Regular rate rhythm S1-S2 normal.  No pedal edema. Gastrointestinal system: Abdomen soft nontender.  Nondistended. Central nervous system: Alert awake and following commands. Skin: No rashes, lesions or ulcers Psychiatry: Judgement and insight appear normal. Mood & affect appropriate.     Data Reviewed: I have personally reviewed following labs and imaging studies  CBC: Recent Labs  Lab 08/22/17 1139 08/23/17 0348  WBC 6.7 8.1  NEUTROABS 4.8  --   HGB 10.5* 11.3*  HCT 33.8* 36.8*  MCV 93.4 93.9  PLT 116* 811*   Basic Metabolic Panel: Recent Labs  Lab 08/22/17 1139 08/22/17  2032 08/23/17 0348 08/24/17 0332 08/25/17 0300  NA 137 135 134* 133* 133*  K 2.6* 3.3* 3.0* 3.5 3.6  CL 96* 94* 94* 94* 95*  CO2 28 27 27 24 25   GLUCOSE 155* 233* 150* 134* 117*  BUN 86* 79* 81* 76* 83*  CREATININE 2.67* 2.58* 2.78* 2.69* 3.13*  CALCIUM 9.1 9.0 9.0 8.6* 8.2*  MG 2.2  --   --   --  2.0  PHOS  --   --   --  2.9 3.2   GFR: Estimated  Creatinine Clearance: 20.8 mL/min (A) (by C-G formula based on SCr of 3.13 mg/dL (H)). Liver Function Tests: Recent Labs  Lab 08/22/17 1139 08/24/17 0332 08/25/17 0300  AST 17  --   --   ALT 17  --   --   ALKPHOS 58  --   --   BILITOT 1.1  --   --   PROT 6.3*  --   --   ALBUMIN 3.3* 3.1* 2.9*   No results for input(s): LIPASE, AMYLASE in the last 168 hours. No results for input(s): AMMONIA in the last 168 hours. Coagulation Profile: No results for input(s): INR, PROTIME in the last 168 hours. Cardiac Enzymes: Recent Labs  Lab 08/22/17 1139  TROPONINI 0.07*   BNP (last 3 results) No results for input(s): PROBNP in the last 8760 hours. HbA1C: Recent Labs    08/23/17 0102  HGBA1C 6.6*   CBG: Recent Labs  Lab 08/24/17 0818 08/24/17 1204 08/24/17 1700 08/24/17 2025 08/25/17 0850  GLUCAP 163* 222* 216* 156* 120*   Lipid Profile: No results for input(s): CHOL, HDL, LDLCALC, TRIG, CHOLHDL, LDLDIRECT in the last 72 hours. Thyroid Function Tests: Recent Labs    08/22/17 2032  TSH 1.243   Anemia Panel: No results for input(s): VITAMINB12, FOLATE, FERRITIN, TIBC, IRON, RETICCTPCT in the last 72 hours. Sepsis Labs: No results for input(s): PROCALCITON, LATICACIDVEN in the last 168 hours.  Recent Results (from the past 240 hour(s))  MRSA PCR Screening     Status: None   Collection Time: 08/23/17  1:43 AM  Result Value Ref Range Status   MRSA by PCR NEGATIVE NEGATIVE Final    Comment:        The GeneXpert MRSA Assay (FDA approved for NASAL specimens only), is one component of a comprehensive MRSA colonization surveillance program. It is not intended to diagnose MRSA infection nor to guide or monitor treatment for MRSA infections.          Radiology Studies: Dg Chest Port 1 View  Result Date: 08/25/2017 CLINICAL DATA:  Productive cough for 4-5 days EXAM: PORTABLE CHEST 1 VIEW COMPARISON:  08/22/2017 FINDINGS: Chronic cardiomegaly. Biventricular  pacer/ICD from the left in stable position. Previous median sternotomy with history of CABG. Chronic low lung volumes. There is interstitial coarsening greater than seen previously. No Kerley lines, effusion, or pneumothorax. Right perihilar density noted on 05/02/2017 chest x-ray is less well defined today. IMPRESSION: New interstitial coarsening which could be congestive or infectious depending on the clinical setting. Electronically Signed   By: Monte Fantasia M.D.   On: 08/25/2017 09:55        Scheduled Meds: . amiodarone  200 mg Oral BID  . amLODipine  5 mg Oral Daily  . aspirin EC  81 mg Oral Daily  . atorvastatin  10 mg Oral Daily  . benzonatate  100 mg Oral BID  . carvedilol  6.25 mg Oral BID WC  . enoxaparin (LOVENOX) injection  30 mg Subcutaneous Daily  . febuxostat  40 mg Oral Daily  . gabapentin  300 mg Oral QHS  . hydrALAZINE  50 mg Oral TID  . insulin aspart  0-5 Units Subcutaneous QHS  . insulin aspart  0-9 Units Subcutaneous TID WC  . loratadine  10 mg Oral Daily  . potassium chloride  40 mEq Oral Daily  . [START ON 08/26/2017] torsemide  100 mg Oral Daily   Continuous Infusions:    LOS: 3 days    Ashtynn Berke Tanna Furry, MD [facility] Pager [phone]  If 7PM-7AM, please contact night-coverage [email or web address] Password [email or web address] 08/25/2017, 11:08 AM

## 2017-08-25 NOTE — Progress Notes (Signed)
Progress Note   Subjective   Feels much better today then he had been overall.  No CP, no SOB.  He mentions he had been pretty anxious about his potassium, though less so with improvement  Inpatient Medications    Scheduled Meds: . amiodarone  200 mg Oral BID  . amLODipine  5 mg Oral Daily  . aspirin EC  81 mg Oral Daily  . atorvastatin  10 mg Oral Daily  . benzonatate  100 mg Oral BID  . carvedilol  6.25 mg Oral BID WC  . enoxaparin (LOVENOX) injection  30 mg Subcutaneous Daily  . febuxostat  40 mg Oral Daily  . gabapentin  300 mg Oral QHS  . hydrALAZINE  50 mg Oral TID  . insulin aspart  0-5 Units Subcutaneous QHS  . insulin aspart  0-9 Units Subcutaneous TID WC  . loratadine  10 mg Oral Daily  . potassium chloride  40 mEq Oral Daily  . [START ON 08/26/2017] torsemide  100 mg Oral Daily   Continuous Infusions:  PRN Meds: acetaminophen **OR** acetaminophen, albuterol, dextromethorphan-guaiFENesin, ondansetron **OR** ondansetron (ZOFRAN) IV, senna-docusate, zolpidem   Vital Signs    Vitals:   08/24/17 2026 08/24/17 2353 08/25/17 0416 08/25/17 0847  BP: 127/68 118/79 121/66 132/78  Pulse: 80 80 81 87  Resp: (!) 28 20 (!) 22 (!) 21  Temp: 98 F (36.7 C)   98.1 F (36.7 C)  TempSrc: Oral  Oral Oral  SpO2: 96% 98% 96% 96%  Weight:   204 lb 9.4 oz (92.8 kg)   Height:        Intake/Output Summary (Last 24 hours) at 08/25/2017 0854 Last data filed at 08/24/2017 1300 Gross per 24 hour  Intake 522.59 ml  Output -  Net 522.59 ml   Filed Weights   08/23/17 0140 08/24/17 0624 08/25/17 0416  Weight: 204 lb 12.9 oz (92.9 kg) 203 lb 0.7 oz (92.1 kg) 204 lb 9.4 oz (92.8 kg)    Telemetry    Sinus with V pacing, PVCs, infrequent couplet - Personally Reviewed  Physical Exam   GEN- The patient is elderly appearing, sleeping but rouses Head- normocephalic, atraumatic Eyes-  Sclera clear, conjunctiva pink Ears- hard of hearing Oropharynx- clear Neck- supple, Lungs-  end exp wheeze b/l, normal work of breathing Heart- RRR  GI- soft, NT, ND Extremities- no clubbing, cyanosis, no edema appreciated today MS- no significant deformity or atrophy Skin- no rash or lesion Psych- euthymic mood, full affect Neuro- strength and sensation are intact   Labs    Chemistry Recent Labs  Lab 08/22/17 1139  08/23/17 0348 08/24/17 0332 08/25/17 0300  NA 137   < > 134* 133* 133*  K 2.6*   < > 3.0* 3.5 3.6  CL 96*   < > 94* 94* 95*  CO2 28   < > 27 24 25   GLUCOSE 155*   < > 150* 134* 117*  BUN 86*   < > 81* 76* 83*  CREATININE 2.67*   < > 2.78* 2.69* 3.13*  CALCIUM 9.1   < > 9.0 8.6* 8.2*  PROT 6.3*  --   --   --   --   ALBUMIN 3.3*  --   --  3.1* 2.9*  AST 17  --   --   --   --   ALT 17  --   --   --   --   ALKPHOS 58  --   --   --   --  BILITOT 1.1  --   --   --   --   GFRNONAA 21*   < > 20* 21* 17*  GFRAA 24*   < > 23* 24* 20*  ANIONGAP 13   < > 13 15 13    < > = values in this interval not displayed.     Hematology Recent Labs  Lab 08/22/17 1139 08/23/17 0348  WBC 6.7 8.1  RBC 3.62* 3.92*  HGB 10.5* 11.3*  HCT 33.8* 36.8*  MCV 93.4 93.9  MCH 29.0 28.8  MCHC 31.1 30.7  RDW 16.2* 16.6*  PLT 116* 114*    Cardiac Enzymes Recent Labs  Lab 08/22/17 1139  TROPONINI 0.07*   No results for input(s): TROPIPOC in the last 168 hours.      Assessment & Plan    1.  Ventricular tachycardia occ PVCs, infrequent couplets IV >> PO amiodarone yesterday Keep K > 3.9 and Mg > 1.9  2. CAD/ ischemic CM No ischemic symptoms Volume management is difficult with renal failure  3. Chronic systolic dysfunction cumulatively by charting euvolemic though no out put charted for yesterday No significant change in weight Patient reports + urination, "about my usual"   4. Stage IV renal failure Creat up some Diuretic was held for today Brisha Mccabe follow with medicine team  5. HTN Stable No change required today  6. Hypokalemia Improving slowly Versie Soave  continue to defer to medicine service   Tommye Standard, PA-C 08/25/2017 8:54 AM  I have seen and examined this patient with Tommye Standard.  Agree with above, note added to reflect my findings.  On exam, iRRR, no murmurs, lungs clear.  Continues to have PVCs, but no further VT. Currently on PO amiodarone. Would continue to watch him on his current dose. If no further VT, would be able to be discharged tomorrow. Alyssabeth Bruster make close follow up in EP clinic. His creatinine is up today, agree with holding diuresis at this time. His K remains low, Elridge Stemm likely need K at discharge daily.   Fermina Mishkin M. Tytionna Cloyd MD 08/25/2017 9:09 AM

## 2017-08-26 ENCOUNTER — Inpatient Hospital Stay (HOSPITAL_COMMUNITY): Payer: Medicare Other

## 2017-08-26 LAB — RENAL FUNCTION PANEL
ALBUMIN: 3 g/dL — AB (ref 3.5–5.0)
ANION GAP: 15 (ref 5–15)
BUN: 88 mg/dL — ABNORMAL HIGH (ref 6–20)
CO2: 22 mmol/L (ref 22–32)
Calcium: 8.2 mg/dL — ABNORMAL LOW (ref 8.9–10.3)
Chloride: 93 mmol/L — ABNORMAL LOW (ref 101–111)
Creatinine, Ser: 3.29 mg/dL — ABNORMAL HIGH (ref 0.61–1.24)
GFR, EST AFRICAN AMERICAN: 19 mL/min — AB (ref >60)
GFR, EST NON AFRICAN AMERICAN: 16 mL/min — AB (ref >60)
Glucose, Bld: 156 mg/dL — ABNORMAL HIGH (ref 65–99)
PHOSPHORUS: 3.1 mg/dL (ref 2.5–4.6)
POTASSIUM: 4.2 mmol/L (ref 3.5–5.1)
Sodium: 130 mmol/L — ABNORMAL LOW (ref 135–145)

## 2017-08-26 LAB — CBC
HEMATOCRIT: 34 % — AB (ref 39.0–52.0)
HEMOGLOBIN: 10.6 g/dL — AB (ref 13.0–17.0)
MCH: 29 pg (ref 26.0–34.0)
MCHC: 31.2 g/dL (ref 30.0–36.0)
MCV: 92.9 fL (ref 78.0–100.0)
Platelets: 122 10*3/uL — ABNORMAL LOW (ref 150–400)
RBC: 3.66 MIL/uL — AB (ref 4.22–5.81)
RDW: 16.2 % — ABNORMAL HIGH (ref 11.5–15.5)
WBC: 8.2 10*3/uL (ref 4.0–10.5)

## 2017-08-26 LAB — GLUCOSE, CAPILLARY
GLUCOSE-CAPILLARY: 126 mg/dL — AB (ref 65–99)
GLUCOSE-CAPILLARY: 124 mg/dL — AB (ref 65–99)
GLUCOSE-CAPILLARY: 157 mg/dL — AB (ref 65–99)
Glucose-Capillary: 204 mg/dL — ABNORMAL HIGH (ref 65–99)

## 2017-08-26 MED ORDER — DM-GUAIFENESIN ER 30-600 MG PO TB12
1.0000 | ORAL_TABLET | Freq: Two times a day (BID) | ORAL | Status: DC
  Administered 2017-08-26 – 2017-08-28 (×4): 1 via ORAL
  Filled 2017-08-26: qty 2
  Filled 2017-08-26 (×3): qty 1

## 2017-08-26 NOTE — Progress Notes (Signed)
Progress Note   Subjective   Feel pretty good, reports yesterday ambulating in room, to/from bathroom without difficulty, no SOB, no CP, no palpitations  Inpatient Medications    Scheduled Meds: . amiodarone  200 mg Oral BID  . amLODipine  5 mg Oral Daily  . amoxicillin-clavulanate  1 tablet Oral BID  . aspirin EC  81 mg Oral Daily  . atorvastatin  10 mg Oral Daily  . benzonatate  100 mg Oral BID  . carvedilol  6.25 mg Oral BID WC  . enoxaparin (LOVENOX) injection  30 mg Subcutaneous Daily  . febuxostat  40 mg Oral Daily  . gabapentin  300 mg Oral QHS  . hydrALAZINE  50 mg Oral TID  . insulin aspart  0-5 Units Subcutaneous QHS  . insulin aspart  0-9 Units Subcutaneous TID WC  . loratadine  10 mg Oral Daily  . potassium chloride  40 mEq Oral Daily   Continuous Infusions:  PRN Meds: acetaminophen **OR** acetaminophen, albuterol, dextromethorphan-guaiFENesin, diphenhydrAMINE, ondansetron **OR** ondansetron (ZOFRAN) IV, senna-docusate, zolpidem   Vital Signs    Vitals:   08/26/17 0452 08/26/17 0453 08/26/17 0500 08/26/17 0849  BP: 132/62   138/61  Pulse: 92     Resp: 18     Temp: 99.4 F (37.4 C)     TempSrc: Oral     SpO2: 90% 98%    Weight:   207 lb 9.6 oz (94.2 kg)   Height:        Intake/Output Summary (Last 24 hours) at 08/26/2017 3428 Last data filed at 08/25/2017 1135 Gross per 24 hour  Intake 400 ml  Output -  Net 400 ml   Filed Weights   08/25/17 0416 08/25/17 1829 08/26/17 0500  Weight: 204 lb 9.4 oz (92.8 kg) 212 lb (96.2 kg) 207 lb 9.6 oz (94.2 kg)    Telemetry    SR, V paced, much less V ectopy, no VT - Personally Reviewed  Physical Exam    GEN- The patient is elderly appearing Head- normocephalic, atraumatic Eyes-  Sclera clear, conjunctiva pink Ears- hard of hearing Oropharynx- clear Neck- supple Lungs- CTA b/l, normal work of breathing Heart- RRR, soft SM, no gallops or rubs GI- soft, NT, ND Extremities- no clubbing, cyanosis, no  edema appreciated today MS- no significant deformity, age appropriate atrophy Skin- no rash or lesion Psych- euthymic mood, full affect Neuro- strength and sensation are intact   Labs    Chemistry Recent Labs  Lab 08/22/17 1139  08/24/17 0332 08/25/17 0300 08/26/17 0423  NA 137   < > 133* 133* 130*  K 2.6*   < > 3.5 3.6 4.2  CL 96*   < > 94* 95* 93*  CO2 28   < > 24 25 22   GLUCOSE 155*   < > 134* 117* 156*  BUN 86*   < > 76* 83* 88*  CREATININE 2.67*   < > 2.69* 3.13* 3.29*  CALCIUM 9.1   < > 8.6* 8.2* 8.2*  PROT 6.3*  --   --   --   --   ALBUMIN 3.3*  --  3.1* 2.9* 3.0*  AST 17  --   --   --   --   ALT 17  --   --   --   --   ALKPHOS 58  --   --   --   --   BILITOT 1.1  --   --   --   --  GFRNONAA 21*   < > 21* 17* 16*  GFRAA 24*   < > 24* 20* 19*  ANIONGAP 13   < > 15 13 15    < > = values in this interval not displayed.     Hematology Recent Labs  Lab 08/22/17 1139 08/23/17 0348 08/26/17 0423  WBC 6.7 8.1 8.2  RBC 3.62* 3.92* 3.66*  HGB 10.5* 11.3* 10.6*  HCT 33.8* 36.8* 34.0*  MCV 93.4 93.9 92.9  MCH 29.0 28.8 29.0  MCHC 31.1 30.7 31.2  RDW 16.2* 16.6* 16.2*  PLT 116* 114* 122*    Cardiac Enzymes Recent Labs  Lab 08/22/17 1139  TROPONINI 0.07*   No results for input(s): TROPIPOC in the last 168 hours.      Assessment & Plan    1.  Ventricular tachycardia occ PVCs, infrequent couplets IV >> PO amiodarone yesterday Keep K > 3.9 and Mg > 1.9  Continue same PO amiodarone, from an EP perspective OK to discharge, I Kaelie Henigan make early follow up for next week  2. CAD/ ischemic CM No ischemic symptoms Volume management is difficult with renal failure, no charted output for 2 days   3. Chronic systolic dysfunction cumulatively by charting euvolemic though no out put charted for yesterday again (2 days) Uncertain on weights accuracy Patient again reports to me he is urinating, "about my usual" Exam does not suggest overt fluid OL  4. Stage IV  renal failure Diuretic was held yesterday Continue management with medicine team  5. HTN Stable No change required today  6. Hypokalemia 4.2 today Ruhama Lehew continue to defer to medicine service   Tommye Standard, PA-C 08/26/2017 9:04 AM  I have seen and examined this patient with Tommye Standard.  Agree with above, note added to reflect my findings.  On exam, RRR, no murmurs, lungs clear. No further VT episodes on monitor. Tolerating amiodarone without issue. Would be able to be discharged on PO amiodarone.   Kolbi Tofte arrange close follow up in EP clinic.   Antha Niday M. Janthony Holleman MD 08/26/2017 9:42 AM

## 2017-08-26 NOTE — Care Management Important Message (Signed)
Important Message  Patient Details  Name: Shane [name] MRN: [id number] Date of Birth: 01-01-1934   Medicare Important Message Given:  Yes    Joy Haegele Montine Circle 08/26/2017, 3:33 PM

## 2017-08-26 NOTE — Progress Notes (Signed)
PROGRESS NOTE    Shane [name]  ZOX:[id number] DOB: Dec 07, 1933 DOA: 08/22/2017 PCP: [name], MD   Brief Narrative:  84 year old male with history of chronic systolic heart failure, diabetes, hypertension, hyperlipidemia, CAD presented to the ER when he was obtained by shock from his ICD he was evaluated by cardiology and found to have serum potassium level of 2.6.    Assessment & Plan:   #Hypokalemia: Due to diuretics.  Serum potassium level 4.2 today.  Continue oral potassium supplement.  Monitor lab.    #Ventricular tachycardia: Switched  to oral amiodarone by cardiology.  Continue to monitor in telemetry.  Cardiology consult appreciated.  Patient is asymptomatic. ICD interrogation per cardiology.  #Chronic systolic congestive heart failure: Looks euvolemic on exam.  Holding torsemide because of continue rising BUN and creatinine.  Patient was able to lie flat.  Repeat chest x-ray today with no acute pulmonary finding.  Monitor electrolytes.  No shortness of breath or chest pain.  #Acute respiratory failure with hypoxia: Currently on room air.  Continue Tessalon and Mucinex for the cough.  Repeat chest x-ray unremarkable.  Currently on 2 L of oxygen.  On Augmentin for possible acute acute bronchitis.  Patient is allergic to azithromycin.  Continue bronchodilators.  #Acute on chronic kidney disease stage IV: Elevated serum creatinine level likely hemodynamically mediated.  Continue to hold torsemide today and repeat lab in the morning.  This will help decide if patient needs diuretics on discharge and doses.   Discussed with the patient.  Monitor urine output.  Does not have urinary symptoms. -UA unremarkable.  #Essential hypertension: Stable.  Monitor blood pressure and continue current management.  On amlodipine, Coreg, hydralazine.  Diuretics on hold.  #Syncope as per prior note: Likely in the setting of arrhythmia.  Management is stable.  TSH level acceptable. PT, OT  evaluation requested Case manager referral.  DVT prophylaxis: Lovenox subcutaneous Code Status: Full code Family Communication: No family at bedside Disposition Plan: telemetry floor.    Consultants:   Cardiology  Procedures: None Antimicrobials: Augmentin  Subjective: Seen and examined at bedside.  Reported cough which is dry.  No shortness of breath or chest pain.  Denies urinary symptoms.  No headache or dizziness. Objective: Vitals:   08/26/17 0452 08/26/17 0453 08/26/17 0500 08/26/17 0849  BP: 132/62   138/61  Pulse: 92     Resp: 18     Temp: 99.4 F (37.4 C)     TempSrc: Oral     SpO2: 90% 98%    Weight:   94.2 kg (207 lb 9.6 oz)   Height:        Intake/Output Summary (Last 24 hours) at 08/26/2017 1207 Last data filed at 08/26/2017 0959 Gross per 24 hour  Intake 362 ml  Output -  Net 362 ml   Filed Weights   08/25/17 0416 08/25/17 1829 08/26/17 0500  Weight: 92.8 kg (204 lb 9.4 oz) 96.2 kg (212 lb) 94.2 kg (207 lb 9.6 oz)    Examination:  General exam: Lying flat on bed, not in distress, able to speak full sentences. Respiratory system: Clear to auscultation bilateral, respiratory for normal.  Cardiovascular system: Regular rate rhythm S1-S2 normal.  No pedal edema. Gastrointestinal system: Abdomen soft nontender.  Nondistended. Central nervous system: Alert awake and following commands. Skin: No rashes, lesions or ulcers Psychiatry: Judgement and insight appear normal. Mood & affect appropriate.     Data Reviewed: I have personally reviewed following labs and imaging studies  CBC: Recent Labs  Lab 08/22/17 1139 08/23/17 0348 08/26/17 0423  WBC 6.7 8.1 8.2  NEUTROABS 4.8  --   --   HGB 10.5* 11.3* 10.6*  HCT 33.8* 36.8* 34.0*  MCV 93.4 93.9 92.9  PLT 116* 114* 606*   Basic Metabolic Panel: Recent Labs  Lab 08/22/17 1139 08/22/17 2032 08/23/17 0348 08/24/17 0332 08/25/17 0300 08/26/17 0423  NA 137 135 134* 133* 133* 130*  K 2.6* 3.3*  3.0* 3.5 3.6 4.2  CL 96* 94* 94* 94* 95* 93*  CO2 28 27 27 24 25 22   GLUCOSE 155* 233* 150* 134* 117* 156*  BUN 86* 79* 81* 76* 83* 88*  CREATININE 2.67* 2.58* 2.78* 2.69* 3.13* 3.29*  CALCIUM 9.1 9.0 9.0 8.6* 8.2* 8.2*  MG 2.2  --   --   --  2.0  --   PHOS  --   --   --  2.9 3.2 3.1   GFR: Estimated Creatinine Clearance: 20 mL/min (A) (by C-G formula based on SCr of 3.29 mg/dL (H)). Liver Function Tests: Recent Labs  Lab 08/22/17 1139 08/24/17 0332 08/25/17 0300 08/26/17 0423  AST 17  --   --   --   ALT 17  --   --   --   ALKPHOS 58  --   --   --   BILITOT 1.1  --   --   --   PROT 6.3*  --   --   --   ALBUMIN 3.3* 3.1* 2.9* 3.0*   No results for input(s): LIPASE, AMYLASE in the last 168 hours. No results for input(s): AMMONIA in the last 168 hours. Coagulation Profile: No results for input(s): INR, PROTIME in the last 168 hours. Cardiac Enzymes: Recent Labs  Lab 08/22/17 1139  TROPONINI 0.07*   BNP (last 3 results) No results for input(s): PROBNP in the last 8760 hours. HbA1C: No results for input(s): HGBA1C in the last 72 hours. CBG: Recent Labs  Lab 08/25/17 0850 08/25/17 1212 08/25/17 1706 08/25/17 2134 08/26/17 0801  GLUCAP 120* 185* 117* 152* 126*   Lipid Profile: No results for input(s): CHOL, HDL, LDLCALC, TRIG, CHOLHDL, LDLDIRECT in the last 72 hours. Thyroid Function Tests: No results for input(s): TSH, T4TOTAL, FREET4, T3FREE, THYROIDAB in the last 72 hours. Anemia Panel: No results for input(s): VITAMINB12, FOLATE, FERRITIN, TIBC, IRON, RETICCTPCT in the last 72 hours. Sepsis Labs: No results for input(s): PROCALCITON, LATICACIDVEN in the last 168 hours.  Recent Results (from the past 240 hour(s))  MRSA PCR Screening     Status: None   Collection Time: 08/23/17  1:43 AM  Result Value Ref Range Status   MRSA by PCR NEGATIVE NEGATIVE Final    Comment:        The GeneXpert MRSA Assay (FDA approved for NASAL specimens only), is one component  of a comprehensive MRSA colonization surveillance program. It is not intended to diagnose MRSA infection nor to guide or monitor treatment for MRSA infections.          Radiology Studies: Dg Chest 2 View  Result Date: 08/26/2017 CLINICAL DATA:  Cough. EXAM: CHEST  2 VIEW COMPARISON:  08/25/2017 and 08/22/2017 FINDINGS: CABG.  AICD in place. Heart size and vascularity are normal. No infiltrates or effusions. Slight chronic accentuation of the interstitial markings at the bases. No acute bone abnormality. IMPRESSION: No active cardiopulmonary disease. Electronically Signed   By: Lorriane Shire M.D.   On: 08/26/2017 11:25   Dg Chest Port 1  View  Result Date: 08/25/2017 CLINICAL DATA:  Productive cough for 4-5 days EXAM: PORTABLE CHEST 1 VIEW COMPARISON:  08/22/2017 FINDINGS: Chronic cardiomegaly. Biventricular pacer/ICD from the left in stable position. Previous median sternotomy with history of CABG. Chronic low lung volumes. There is interstitial coarsening greater than seen previously. No Kerley lines, effusion, or pneumothorax. Right perihilar density noted on 05/02/2017 chest x-ray is less well defined today. IMPRESSION: New interstitial coarsening which could be congestive or infectious depending on the clinical setting. Electronically Signed   By: Monte Fantasia M.D.   On: 08/25/2017 09:55        Scheduled Meds: . amiodarone  200 mg Oral BID  . amLODipine  5 mg Oral Daily  . amoxicillin-clavulanate  1 tablet Oral BID  . aspirin EC  81 mg Oral Daily  . atorvastatin  10 mg Oral Daily  . benzonatate  100 mg Oral BID  . carvedilol  6.25 mg Oral BID WC  . dextromethorphan-guaiFENesin  1 tablet Oral BID  . enoxaparin (LOVENOX) injection  30 mg Subcutaneous Daily  . febuxostat  40 mg Oral Daily  . gabapentin  300 mg Oral QHS  . hydrALAZINE  50 mg Oral TID  . insulin aspart  0-5 Units Subcutaneous QHS  . insulin aspart  0-9 Units Subcutaneous TID WC  . loratadine  10 mg Oral  Daily  . potassium chloride  40 mEq Oral Daily   Continuous Infusions:    LOS: 4 days    Jerusha Reising Tanna Furry, MD [facility] Pager [phone]  If 7PM-7AM, please contact night-coverage [email or web address] Password [facility] 08/26/2017, 12:07 PM

## 2017-08-27 DIAGNOSIS — N179 Acute kidney failure, unspecified: Secondary | ICD-10-CM

## 2017-08-27 LAB — BASIC METABOLIC PANEL
ANION GAP: 15 (ref 5–15)
BUN: 98 mg/dL — ABNORMAL HIGH (ref 6–20)
CHLORIDE: 93 mmol/L — AB (ref 101–111)
CO2: 22 mmol/L (ref 22–32)
CREATININE: 3.43 mg/dL — AB (ref 0.61–1.24)
Calcium: 8.3 mg/dL — ABNORMAL LOW (ref 8.9–10.3)
GFR calc non Af Amer: 15 mL/min — ABNORMAL LOW (ref >60)
GFR, EST AFRICAN AMERICAN: 18 mL/min — AB (ref >60)
Glucose, Bld: 131 mg/dL — ABNORMAL HIGH (ref 65–99)
POTASSIUM: 4.3 mmol/L (ref 3.5–5.1)
SODIUM: 130 mmol/L — AB (ref 135–145)

## 2017-08-27 LAB — BRAIN NATRIURETIC PEPTIDE: B Natriuretic Peptide: 668.3 pg/mL — ABNORMAL HIGH (ref 0.0–100.0)

## 2017-08-27 LAB — GLUCOSE, CAPILLARY
GLUCOSE-CAPILLARY: 153 mg/dL — AB (ref 65–99)
GLUCOSE-CAPILLARY: 107 mg/dL — AB (ref 65–99)
Glucose-Capillary: 134 mg/dL — ABNORMAL HIGH (ref 65–99)
Glucose-Capillary: 136 mg/dL — ABNORMAL HIGH (ref 65–99)

## 2017-08-27 MED ORDER — SENNOSIDES-DOCUSATE SODIUM 8.6-50 MG PO TABS
2.0000 | ORAL_TABLET | Freq: Every day | ORAL | Status: DC
  Administered 2017-08-27: 2 via ORAL
  Filled 2017-08-27: qty 2

## 2017-08-27 MED ORDER — ALBUMIN HUMAN 25 % IV SOLN
25.0000 g | Freq: Four times a day (QID) | INTRAVENOUS | Status: AC
  Administered 2017-08-27 (×2): 25 g via INTRAVENOUS
  Filled 2017-08-27 (×2): qty 50

## 2017-08-27 MED ORDER — POLYETHYLENE GLYCOL 3350 17 G PO PACK
17.0000 g | PACK | Freq: Two times a day (BID) | ORAL | Status: DC
  Administered 2017-08-27 – 2017-08-28 (×3): 17 g via ORAL
  Filled 2017-08-27 (×3): qty 1

## 2017-08-27 NOTE — Plan of Care (Signed)
  Health Behavior/Discharge Planning: Ability to manage health-related needs will improve 08/27/2017 9024 - Progressing by Verne Grain, RN  Educated patient that torsemide was held due to poor kidney function and if taken could make worst, may also deplete potassium level.

## 2017-08-27 NOTE — Consult Note (Addendum)
   [facility] CM Inpatient Consult   08/27/2017  KEISUKE HOLLABAUGH [birth year] [id number]   Patient screened for potential [facility] Care Management services due to multiple co-morbidites.   Spoke with Mr. Presley at bedside. Discussed that he could benefit from the [place] program for additional aide services and support along with home health. He is agreeable to [place] referral. He states he is okay with retracting [facility] referral if he is accepted on the [place] program.  Mr. Fudala states "I was independent two weeks ago and was taking care of my wife. Now I cannot even take care of myself".   Mr. Peasley wife is also a patient in the hospital currently.   Discussed that [facility] [facility] will assist if transportation or pharmacy needs are identified if he is enrolled in the [place] program. Mr. Carstens is agreeable to this. [facility] Care Management written consent obtained.   Made him aware that [facility] could potentially follow after he has completed the [place] with [place] aware of referral. Tommi Rumps states he will follow up with both patient and wife tomorrow at bedside to further discuss [place].  Discussed all of the above with inpatient RNCM.  Also Mr. Hyson gave permission for his wife to have [place] program as well. His wife asked for Mr. Nolon Nations to be asked before consenting.   Marthenia Rolling, MSN-Ed, RN,BSN [facility] Liaison [phone]

## 2017-08-27 NOTE — Progress Notes (Addendum)
Physical Therapy Treatment Patient Details Name: Shane [name] MRN: [id number] DOB: Dec 22, 1933 Today's Date: 08/27/2017    History of Present Illness Pt is an 84 y.o. male with PMH significant for CHF, DM, HTN, HLD, and CAD, who presents to ED on 08/22/17 after he was awakened by shock from his ICD. Was evaluated by cardiology who recommended medical admission due to a potassium level of 2.6.    PT Comments    Pt performed increased activity but used the RW as pain reported in back and leg/hip.  Pt tolerated activity well.  He remains motivated to move as he is fixated on having a BM.  OT dovetailed treatment with patient and took over with patient standing at the sink.  Plan next session for gait with cane as patient is able to tolerate due to pain.   Follow Up Recommendations  Home health PT;Supervision - Intermittent     Equipment Recommendations  None recommended by PT    Recommendations for Other Services       Precautions / Restrictions Precautions Precautions: Fall Precaution Comments: monitor O2 sats Required Braces or Orthoses: Other Brace/Splint Other Brace/Splint: lace up brace for LLE (not here), wears a pull on brace for left knee Restrictions Weight Bearing Restrictions: No    Mobility  Bed Mobility               General bed mobility comments: Pt in recliner on arrival  Transfers Overall transfer level: Needs assistance Equipment used: Rolling walker (2 wheeled) Transfers: Sit to/from Stand Sit to Stand: Supervision         General transfer comment: Cues for hand placement to push from seated surface.    Ambulation/Gait Ambulation/Gait assistance: Min guard Ambulation Distance (Feet): 200 Feet Assistive device: Rolling walker (2 wheeled) Gait Pattern/deviations: Step-through pattern;Decreased stride length Gait velocity: Decreased Gait velocity interpretation: Below normal speed for age/gender General Gait Details: Pt kept using a RW due to  pain in hip/back to alleviate weight in stance phase to reduce pain.  Pt required cues for safety with device and forward gaze.     Stairs            Wheelchair Mobility    Modified Rankin (Stroke Patients Only)       Balance Overall balance assessment: Needs assistance Sitting-balance support: Feet supported;No upper extremity supported Sitting balance-Leahy Scale: Good     Standing balance support: No upper extremity supported;During functional activity Standing balance-Leahy Scale: Poor Standing balance comment: relies on BUE support from RW or leans against sink                            Cognition Arousal/Alertness: Awake/alert Behavior During Therapy: WFL for tasks assessed/performed Overall Cognitive Status: Within Functional Limits for tasks assessed                                        Exercises      General Comments General comments (skin integrity, edema, etc.): no energy conservation handout provided this session but reviewed      Pertinent Vitals/Pain Pain Assessment: 0-10 Pain Score: 2  Pain Location: pinched nerve in leg/hip (baseline per Patient) Pain Descriptors / Indicators: Other (Comment)(pinched) Pain Intervention(s): Monitored during session;Repositioned    Home Living  Prior Function            PT Goals (current goals can now be found in the care plan section) Acute Rehab PT Goals Patient Stated Goal: Return home Potential to Achieve Goals: Good Progress towards PT goals: Progressing toward goals    Frequency    Min 3X/week      PT Plan Current plan remains appropriate    Co-evaluation              AM-PAC PT "6 Clicks" Daily Activity  Outcome Measure  Difficulty turning over in bed (including adjusting bedclothes, sheets and blankets)?: None Difficulty moving from lying on back to sitting on the side of the bed? : None Difficulty sitting down on and  standing up from a chair with arms (e.g., wheelchair, bedside commode, etc,.)?: A Little Help needed moving to and from a bed to chair (including a wheelchair)?: A Little Help needed walking in hospital room?: A Little Help needed climbing 3-5 steps with a railing? : A Little 6 Click Score: 20    End of Session Equipment Utilized During Treatment: Gait belt Activity Tolerance: Patient tolerated treatment well Patient left: in chair;with call bell/phone within reach;with nursing/sitter in room Nurse Communication: Mobility status PT Visit Diagnosis: Other abnormalities of gait and mobility (R26.89)     Time: 1438-8875 PT Time Calculation (min) (ACUTE ONLY): 11 min  Charges:  $Gait Training: 8-22 mins                    G Codes:       Shane [name], PTA pager [place] 08/27/2017, 5:32 PM

## 2017-08-27 NOTE — Progress Notes (Signed)
Occupational Therapy Treatment Patient Details Name: Shane [name] MRN: [id number] DOB: 01-19-1934 Today's Date: 08/27/2017    History of present illness Pt is an 84 y.o. male with PMH significant for CHF, DM, HTN, HLD, and CAD, who presents to ED on 08/22/17 after he was awakened by shock from his ICD. Was evaluated by cardiology who recommended medical admission due to a potassium level of 2.6.   OT comments  Pt seen as dove-tail to PT session (had been walking in the hallway). Pt was able to perform standing grooming tasks at sink (oral care, hand and face washing) and transfers in the room/mobility with RW for external support and balance. OT focused on education surrounding Energy conservation. (no handout provided) and Pt verbalized understanding. OT will continue to follow in the acute setting and HHOT remains necessary and appropriate as Pt's wife is currently hospitalized so she will be unable to assist Pt at dc.   Follow Up Recommendations  Home health OT;Supervision - Intermittent    Equipment Recommendations  None recommended by OT    Recommendations for Other Services      Precautions / Restrictions Precautions Precautions: Fall Precaution Comments: monitor O2 sats Required Braces or Orthoses: Other Brace/Splint Other Brace/Splint: lace up brace for LLE (not here), wears a pull on brace for left knee Restrictions Weight Bearing Restrictions: No       Mobility Bed Mobility               General bed mobility comments: Pt received in hallway with PT and returned to recliner  Transfers Overall transfer level: Needs assistance Equipment used: Rolling walker (2 wheeled) Transfers: Sit to/from Stand Sit to Stand: Min guard         General transfer comment: safe hand placement with RW min guard for safety    Balance Overall balance assessment: Needs assistance Sitting-balance support: Feet supported;No upper extremity supported Sitting balance-Leahy Scale:  Good     Standing balance support: No upper extremity supported;During functional activity Standing balance-Leahy Scale: Poor Standing balance comment: relies on BUE support from RW or leans against sink                           ADL either performed or assessed with clinical judgement   ADL Overall ADL's : Needs assistance/impaired     Grooming: Wash/dry hands;Wash/dry face;Oral care;Min guard;Cueing for compensatory techniques Grooming Details (indicate cue type and reason): at sink level                             Functional mobility during ADLs: Min guard;Rolling walker General ADL Comments: Pt verbally reviewed energy conservation strategies for ADL and talked about balance between building up activity tolerance and energy conservation.      Vision       Perception     Praxis      Cognition Arousal/Alertness: Awake/alert Behavior During Therapy: WFL for tasks assessed/performed Overall Cognitive Status: Within Functional Limits for tasks assessed                                          Exercises     Shoulder Instructions       General Comments no energy conservation handout provided this session but reviewed    Pertinent Vitals/ Pain  Pain Assessment: 0-10 Pain Score: 2  Pain Location: pinched nerve in leg/hip (baseline per Patient) Pain Descriptors / Indicators: Other (Comment)(pinched) Pain Intervention(s): Monitored during session;Repositioned  Home Living                                          Prior Functioning/Environment              Frequency  Min 2X/week        Progress Toward Goals  OT Goals(current goals can now be found in the care plan section)  Progress towards OT goals: Progressing toward goals  Acute Rehab OT Goals Patient Stated Goal: Return home OT Goal Formulation: With patient Time For Goal Achievement: 09/01/17 Potential to Achieve Goals: Good  Plan  Discharge plan remains appropriate;Frequency remains appropriate    Co-evaluation                 AM-PAC PT "6 Clicks" Daily Activity     Outcome Measure   Help from another person eating meals?: None Help from another person taking care of personal grooming?: A Little Help from another person toileting, which includes using toliet, bedpan, or urinal?: A Little Help from another person bathing (including washing, rinsing, drying)?: A Little Help from another person to put on and taking off regular upper body clothing?: A Little Help from another person to put on and taking off regular lower body clothing?: A Little 6 Click Score: 19    End of Session Equipment Utilized During Treatment: Gait belt;Rolling walker  OT Visit Diagnosis: Unsteadiness on feet (R26.81);Other abnormalities of gait and mobility (R26.89)   Activity Tolerance Patient tolerated treatment well   Patient Left in chair;with call bell/phone within reach   Nurse Communication Mobility status;Other (comment)(IV beeping "air in line")        Time: 9371-6967 OT Time Calculation (min): 15 min  Charges: OT General Charges $OT Visit: 1 Visit OT Treatments $Self Care/Home Management : 8-22 mins  Hulda Humphrey OTR/L [place] 08/27/2017, 5:17 PM

## 2017-08-27 NOTE — Progress Notes (Addendum)
PROGRESS NOTE    Shane [name]  ZOX:[id number] DOB: 1933/10/07 DOA: 08/22/2017 PCP: [name], MD   Brief Narrative:  84 year old male with history of chronic systolic heart failure, diabetes, hypertension, hyperlipidemia, CAD presented to the ER when he was obtained by shock from his ICD he was evaluated by cardiology and found to have serum potassium level of 2.6.    Assessment & Plan:   #Hypokalemia: Due to diuretics.  Serum potassium level 4.3 today.  Discontinue potassium supplement with renal failure.  Monitor lab.  #Ventricular tachycardia: Continue oral amiodarone by cardiology.  Continue to monitor in telemetry.  Cardiology consult appreciated.  Patient is asymptomatic. ICD interrogation per cardiology.  #Chronic systolic congestive heart failure:  -Patient looks euvolemic on exam except trace lower extremity chronic edema.  He is able to lie flat without shortness of breath.  Holding diuretics in the setting of worsening renal failure.  Continue cardiac medication.  Continue supportive care.  #Acute respiratory failure with hypoxia: Currently on room air.  Continue Tessalon and Mucinex for the cough.  Repeat chest x-ray unremarkable.   On Augmentin for possible acute acute bronchitis.  Patient is allergic to azithromycin.  Continue bronchodilators.  #Acute on chronic kidney disease stage IV:  -Serum creatinine level and BUN level is worsening likely prerenal in the setting of diuretics.  Patient does not have uremic symptoms.  He reported urinating good however not exactly measuring the urine output.  Continue to hold torsemide.  UA unremarkable.  Bladder scan with no urinary retention.  I am checking ultrasound of kidneys.  BNP was checked today, it is 668, this is chronic elevation.  I will not order IV fluid in the setting of CHF.I will try few doses of albumin. - I think it is not safe for patient to go home when renal function is continually worsening and the tests are  under way. The plan for diuretics depends on renal function as well.    #Essential hypertension: Stable.  Monitor blood pressure and continue current management.  On amlodipine, Coreg, hydralazine.  Diuretics on hold.  #Syncope as per prior note: Likely in the setting of arrhythmia.  Management is stable.  TSH level acceptable. PT, OT evaluation requested Case manager referral.  DVT prophylaxis: Lovenox subcutaneous Code Status: Full code Family Communication: No family at bedside Disposition Plan: telemetry floor.    Consultants:   Cardiology  Procedures: None Antimicrobials: Augmentin  Subjective: Seen and examined at bedside. No CP, SOB, NV.  Objective: Vitals:   08/26/17 2138 08/27/17 0531 08/27/17 0845 08/27/17 1338  BP: 131/62 139/70 139/76 128/66  Pulse: 77 88  77  Resp: (!) 22 18  20   Temp: 98.6 F (37 C) 98.9 F (37.2 C)  98.2 F (36.8 C)  TempSrc: Oral Oral  Oral  SpO2: 90% 90%  94%  Weight:  94.3 kg (207 lb 12.8 oz)    Height:        Intake/Output Summary (Last 24 hours) at 08/27/2017 1406 Last data filed at 08/27/2017 1134 Gross per 24 hour  Intake 420 ml  Output 200 ml  Net 220 ml   Filed Weights   08/25/17 1829 08/26/17 0500 08/27/17 0531  Weight: 96.2 kg (212 lb) 94.2 kg (207 lb 9.6 oz) 94.3 kg (207 lb 12.8 oz)    Examination:  General exam: lying flat on bed with no SOB or chest pain, NAD Respiratory system: CTAB, no wheeze Cardiovascular system: RRR s1s2, b/l LE trace edema chronic. Gastrointestinal  system: Abdomen soft, NT, BS+ Central nervous system: Alert awake and following commands. Skin: No rashes, lesions or ulcers Psychiatry: Judgement and insight appear normal. Mood & affect appropriate.     Data Reviewed: I have personally reviewed following labs and imaging studies  CBC: Recent Labs  Lab 08/22/17 1139 08/23/17 0348 08/26/17 0423  WBC 6.7 8.1 8.2  NEUTROABS 4.8  --   --   HGB 10.5* 11.3* 10.6*  HCT 33.8* 36.8* 34.0*   MCV 93.4 93.9 92.9  PLT 116* 114* 671*   Basic Metabolic Panel: Recent Labs  Lab 08/22/17 1139  08/23/17 0348 08/24/17 0332 08/25/17 0300 08/26/17 0423 08/27/17 0243  NA 137   < > 134* 133* 133* 130* 130*  K 2.6*   < > 3.0* 3.5 3.6 4.2 4.3  CL 96*   < > 94* 94* 95* 93* 93*  CO2 28   < > 27 24 25 22 22   GLUCOSE 155*   < > 150* 134* 117* 156* 131*  BUN 86*   < > 81* 76* 83* 88* 98*  CREATININE 2.67*   < > 2.78* 2.69* 3.13* 3.29* 3.43*  CALCIUM 9.1   < > 9.0 8.6* 8.2* 8.2* 8.3*  MG 2.2  --   --   --  2.0  --   --   PHOS  --   --   --  2.9 3.2 3.1  --    < > = values in this interval not displayed.   GFR: Estimated Creatinine Clearance: 19.1 mL/min (A) (by C-G formula based on SCr of 3.43 mg/dL (H)). Liver Function Tests: Recent Labs  Lab 08/22/17 1139 08/24/17 0332 08/25/17 0300 08/26/17 0423  AST 17  --   --   --   ALT 17  --   --   --   ALKPHOS 58  --   --   --   BILITOT 1.1  --   --   --   PROT 6.3*  --   --   --   ALBUMIN 3.3* 3.1* 2.9* 3.0*   No results for input(s): LIPASE, AMYLASE in the last 168 hours. No results for input(s): AMMONIA in the last 168 hours. Coagulation Profile: No results for input(s): INR, PROTIME in the last 168 hours. Cardiac Enzymes: Recent Labs  Lab 08/22/17 1139  TROPONINI 0.07*   BNP (last 3 results) No results for input(s): PROBNP in the last 8760 hours. HbA1C: No results for input(s): HGBA1C in the last 72 hours. CBG: Recent Labs  Lab 08/26/17 1153 08/26/17 1630 08/26/17 2136 08/27/17 0750 08/27/17 1207  GLUCAP 204* 124* 157* 153* 134*   Lipid Profile: No results for input(s): CHOL, HDL, LDLCALC, TRIG, CHOLHDL, LDLDIRECT in the last 72 hours. Thyroid Function Tests: No results for input(s): TSH, T4TOTAL, FREET4, T3FREE, THYROIDAB in the last 72 hours. Anemia Panel: No results for input(s): VITAMINB12, FOLATE, FERRITIN, TIBC, IRON, RETICCTPCT in the last 72 hours. Sepsis Labs: No results for input(s): PROCALCITON,  LATICACIDVEN in the last 168 hours.  Recent Results (from the past 240 hour(s))  MRSA PCR Screening     Status: None   Collection Time: 08/23/17  1:43 AM  Result Value Ref Range Status   MRSA by PCR NEGATIVE NEGATIVE Final    Comment:        The GeneXpert MRSA Assay (FDA approved for NASAL specimens only), is one component of a comprehensive MRSA colonization surveillance program. It is not intended to diagnose MRSA infection nor to  guide or monitor treatment for MRSA infections.          Radiology Studies: Dg Chest 2 View  Result Date: 08/26/2017 CLINICAL DATA:  Cough. EXAM: CHEST  2 VIEW COMPARISON:  08/25/2017 and 08/22/2017 FINDINGS: CABG.  AICD in place. Heart size and vascularity are normal. No infiltrates or effusions. Slight chronic accentuation of the interstitial markings at the bases. No acute bone abnormality. IMPRESSION: No active cardiopulmonary disease. Electronically Signed   By: Lorriane Shire M.D.   On: 08/26/2017 11:25        Scheduled Meds: . amiodarone  200 mg Oral BID  . amLODipine  5 mg Oral Daily  . amoxicillin-clavulanate  1 tablet Oral BID  . aspirin EC  81 mg Oral Daily  . atorvastatin  10 mg Oral Daily  . benzonatate  100 mg Oral BID  . carvedilol  6.25 mg Oral BID WC  . dextromethorphan-guaiFENesin  1 tablet Oral BID  . enoxaparin (LOVENOX) injection  30 mg Subcutaneous Daily  . febuxostat  40 mg Oral Daily  . gabapentin  300 mg Oral QHS  . hydrALAZINE  50 mg Oral TID  . insulin aspart  0-5 Units Subcutaneous QHS  . insulin aspart  0-9 Units Subcutaneous TID WC  . loratadine  10 mg Oral Daily   Continuous Infusions:    LOS: 5 days    Dimond Crotty Tanna Furry, MD [facility] Pager [phone]  If 7PM-7AM, please contact night-coverage [email or web address] Password [facility] 08/27/2017, 2:06 PM

## 2017-08-28 ENCOUNTER — Inpatient Hospital Stay (HOSPITAL_COMMUNITY): Payer: Medicare Other

## 2017-08-28 LAB — RENAL FUNCTION PANEL
Albumin: 3.3 g/dL — ABNORMAL LOW (ref 3.5–5.0)
Anion gap: 13 (ref 5–15)
BUN: 86 mg/dL — ABNORMAL HIGH (ref 6–20)
CALCIUM: 8.6 mg/dL — AB (ref 8.9–10.3)
CO2: 23 mmol/L (ref 22–32)
CREATININE: 3.13 mg/dL — AB (ref 0.61–1.24)
Chloride: 96 mmol/L — ABNORMAL LOW (ref 101–111)
GFR calc non Af Amer: 17 mL/min — ABNORMAL LOW (ref >60)
GFR, EST AFRICAN AMERICAN: 20 mL/min — AB (ref >60)
GLUCOSE: 129 mg/dL — AB (ref 65–99)
Phosphorus: 3 mg/dL (ref 2.5–4.6)
Potassium: 4.1 mmol/L (ref 3.5–5.1)
SODIUM: 132 mmol/L — AB (ref 135–145)

## 2017-08-28 LAB — CBC
HCT: 33.1 % — ABNORMAL LOW (ref 39.0–52.0)
Hemoglobin: 10.3 g/dL — ABNORMAL LOW (ref 13.0–17.0)
MCH: 29.1 pg (ref 26.0–34.0)
MCHC: 31.1 g/dL (ref 30.0–36.0)
MCV: 93.5 fL (ref 78.0–100.0)
PLATELETS: 132 10*3/uL — AB (ref 150–400)
RBC: 3.54 MIL/uL — ABNORMAL LOW (ref 4.22–5.81)
RDW: 16.4 % — ABNORMAL HIGH (ref 11.5–15.5)
WBC: 7.5 10*3/uL (ref 4.0–10.5)

## 2017-08-28 LAB — GLUCOSE, CAPILLARY
GLUCOSE-CAPILLARY: 132 mg/dL — AB (ref 65–99)
Glucose-Capillary: 145 mg/dL — ABNORMAL HIGH (ref 65–99)

## 2017-08-28 MED ORDER — POLYETHYLENE GLYCOL 3350 17 G PO PACK: 17.0000 g | PACK | Freq: Every day | ORAL | 0 refills | Status: DC | PRN

## 2017-08-28 MED ORDER — DM-GUAIFENESIN ER 30-600 MG PO TB12: 1.0000 | ORAL_TABLET | Freq: Two times a day (BID) | ORAL | 0 refills | Status: DC | PRN

## 2017-08-28 MED ORDER — AMIODARONE HCL 200 MG PO TABS: 200.0000 mg | ORAL_TABLET | Freq: Two times a day (BID) | ORAL | 0 refills | Status: DC

## 2017-08-28 NOTE — Care Management Note (Signed)
Case Management Note  Patient Details  Name: Shane [name] MRN: [id number] Date of Birth: 05-04-1934  Subjective/Objective:      Hypokalemia            Macallister Ashmead (Spouse)     [phone]       PCP: Billey Gosling  Action/Plan: Transition to home today with home health services [facility] ) to follow. Transportation to home will be provided by daughter.  Expected Discharge Date:  08/28/17               Expected Discharge Plan:  [facility]  In-House Referral:     Discharge planning Services  CM Consult  Post Acute Care Choice:  Home Health Choice offered to:  Patient  DME Arranged:    DME Agency:     HH Arranged:  PT, OT, RN, Social Work [employer] Agency:  [facility](Home 1st program)  Status of Service:  Completed, signed off  If discussed at [employer] of Stay Meetings, dates discussed:    Additional Comments:  Sharin Mons, RN 08/28/2017, 12:33 PM

## 2017-08-28 NOTE — Progress Notes (Signed)
Shane [name] to be D/C'd Home per MD order.  Discussed with the patient and all questions fully answered.  VSS, Skin clean, dry and intact without evidence of skin break down, no evidence of skin tears noted. IV catheter discontinued intact. Site without signs and symptoms of complications. Dressing and pressure applied.  An After Visit Summary was printed and given to the patient. Patient received prescription.  D/c education completed with patient/family including follow up instructions, medication list, d/c activities limitations if indicated, with other d/c instructions as indicated by MD - patient able to verbalize understanding, all questions fully answered.   Patient instructed to return to ED, call 911, or call MD for any changes in condition.   Patient escorted via [place], and D/C home via private auto.  Dorris Carnes 08/28/2017 5:59 PM

## 2017-08-28 NOTE — Discharge Summary (Signed)
Physician Discharge Summary  Shane [name] IRJ:[id number] DOB: October 21, 1933 DOA: 08/22/2017  PCP: [name], MD  Admit date: 08/22/2017 Discharge date: 08/28/2017  Admitted From:home Disposition:home  Recommendations for Outpatient Follow-up:  1. Follow up with PCP in 1-2 weeks 2. Please obtain BMP/CBC in one week  Home Health:yes Equipment/Devices:none Discharge Condition:stable CODE STATUS:full code Diet recommendation:low salt diet  Brief/Interim Summary: 84 year old male with history of chronic systolic heart failure, diabetes, hypertension, hyperlipidemia, CAD presented to the ER when he was obtained by shock from his ICD he was evaluated by cardiology and found to have serum potassium level of 2.6.   #Hypokalemia: Due to diuretics.    Serum potassium level acceptable.  Recommended to continue potassium chloride tablet while on diuretics.  Outpatient lab monitoring.  #Ventricular tachycardia: Continue oral amiodarone by cardiology.  Continue to monitor in telemetry.  Cardiology consult appreciated.  Patient is asymptomatic. ICD interrogation per cardiology.  Follow-up with cardiologist as scheduled.  #Chronic systolic congestive heart failure:  -Resume torsemide and recommend to hold metolazone while he will see nephrologist next week.  Recommend to monitor weight every day.  He will follow-up with cardiologist.  Recommend to continue current cardiac medication.  #Acute respiratory failure with hypoxia: Improved.  Completed antibiotics for possible acute bronchitis, possible bacterial.  #Acute on chronic kidney disease stage IV:  -Oral torsemide held few days because of worsening renal failure.  Patient has CHF with elevated BNP therefore recommended to continue torsemide until he will be seen by nephrologist next week.  Ultrasound of kidneys unremarkable.  Patient does not have any uremic symptoms.  I recommend him to avoid any nephrotoxins, daily weight monitoring.  His  serum creatinine level trended down today.  He is nonoliguric while not receiving torsemide.  Needs close follow-up.  I called Dr. Hollie Salk who scheduled patient's nephrology appointment early next week.  I discussed with patient and he verbalized understanding.  #Essential hypertension: Stable.  Monitor blood pressure and continue current management.  On amlodipine, Coreg, hydralazine and diuretics.  #Syncope as per prior note: Likely in the setting of arrhythmia.  Management is stable.  TSH level acceptable. PT, OT evaluation done and patient is going home with home care services.  Discharge medication and follow-up as below.  Discharge Diagnoses:  Principal Problem:   Hypokalemia Active Problems:   DM (diabetes mellitus), type 2 with renal complications (HCC)   CAD s/p coronary arthery bypass graft   BPH (benign prostatic hyperplasia)   Chronic kidney disease (CKD), stage IV (severe) (HCC)   Syncope   Essential hypertension   Chronic combined systolic and diastolic CHF (congestive heart failure) (HCC)   Diabetic neuropathy (HCC)   Elevated troponin   ICD (implantable cardioverter-defibrillator) discharge   Ventricular tachycardia (HCC)   AKI (acute kidney injury) [facility])    Discharge Instructions  Discharge Instructions    (HEART FAILURE PATIENTS) Call MD:  Anytime you have any of the following symptoms: 1) 3 pound weight gain in 24 hours or 5 pounds in 1 week 2) shortness of breath, with or without a dry hacking cough 3) swelling in the hands, feet or stomach 4) if you have to sleep on extra pillows at night in order to breathe.   Complete by:  As directed    Call MD for:  difficulty breathing, headache or visual disturbances   Complete by:  As directed    Call MD for:  extreme fatigue   Complete by:  As directed    Call  MD for:  hives   Complete by:  As directed    Call MD for:  persistant dizziness or light-headedness   Complete by:  As directed    Call MD for:   persistant nausea and vomiting   Complete by:  As directed    Call MD for:  severe uncontrolled pain   Complete by:  As directed    Call MD for:  temperature >100.4   Complete by:  As directed    Diet - low sodium heart healthy   Complete by:  As directed    Face-to-face encounter (required for Medicare/Medicaid patients)   Complete by:  As directed    I Dron Tanna Furry certify that this patient is under my care and that I, or a nurse practitioner or physician's assistant working with me, had a face-to-face encounter that meets the physician face-to-face encounter requirements with this patient on 08/25/2017. The encounter with the patient was in whole, or in part for the following medical condition(s) which is the primary reason for home health care (List medical condition):CHF, hypoxia, CKD, weakness   The encounter with the patient was in whole, or in part, for the following medical condition, which is the primary reason for home health care:  CHF, hypoxia, CKD, weakness   I certify that, based on my findings, the following services are medically necessary home health services:   Nursing Physical therapy     Reason for Medically Necessary Home Health Services:  Skilled Nursing- Skilled Assessment/Observation   My clinical findings support the need for the above services:  Unable to leave home safely without assistance and/or assistive device   Further, I certify that my clinical findings support that this patient is homebound due to:  Shortness of Breath with activity   Face-to-face encounter (required for Medicare/Medicaid patients)   Complete by:  As directed    I Dron Tanna Furry certify that this patient is under my care and that I, or a nurse practitioner or physician's assistant working with me, had a face-to-face encounter that meets the physician face-to-face encounter requirements with this patient on 08/26/2017. The encounter with the patient was in whole, or in part for the  following medical condition(s) which is the primary reason for home health care (List medical condition):ICD, renal failure, CHF   The encounter with the patient was in whole, or in part, for the following medical condition, which is the primary reason for home health care:  ICD, renal failure, CHF   I certify that, based on my findings, the following services are medically necessary home health services:   Nursing Physical therapy     Reason for Medically Necessary Home Health Services:  Skilled Nursing- Skilled Assessment/Observation   My clinical findings support the need for the above services:  Unable to leave home safely without assistance and/or assistive device   Further, I certify that my clinical findings support that this patient is homebound due to:  Unable to leave home safely without assistance   Home Health   Complete by:  As directed    To provide the following care/treatments:   PT OT [place]   Complete by:  As directed    To provide the following care/treatments:   PT OT [place] work     Increase activity slowly   Complete by:  As directed      Allergies as of 08/28/2017  Reactions   Codeine Other (See Comments)   anxiety   Hydrocodone Other (See Comments)   Anxiety- can take in liquid form   Pseudoephedrine Other (See Comments)   Causes heart to race   Azithromycin Rash      Medication List    TAKE these medications   acetaminophen 500 MG tablet Commonly known as:  TYLENOL Take 1,000 mg by mouth every 8 (eight) hours as needed (pain).   albuterol 108 (90 Base) MCG/ACT inhaler Commonly known as:  PROVENTIL HFA;VENTOLIN HFA Inhale 2 puffs into the lungs every 4 (four) hours as needed for wheezing or shortness of breath (cough, shortness of breath or wheezing.).   amiodarone 200 MG tablet Commonly known as:  PACERONE Take 1 tablet (200 mg total) by mouth 2 (two) times daily.   amLODipine 5 MG  tablet Commonly known as:  NORVASC Take 1 tablet (5 mg total) by mouth daily.   aspirin EC 81 MG tablet Take 81 mg by mouth daily.   atorvastatin 10 MG tablet Commonly known as:  LIPITOR Take 1 tablet (10 mg total) by mouth daily.   carvedilol 6.25 MG tablet Commonly known as:  COREG Take 1 tablet (6.25 mg total) by mouth 2 (two) times daily with a meal.   cetirizine 10 MG tablet Commonly known as:  ZYRTEC Take 10 mg by mouth at bedtime as needed (seasonal allergies).   dextromethorphan-guaiFENesin 30-600 MG 12hr tablet Commonly known as:  MUCINEX DM Take 1 tablet by mouth 2 (two) times daily as needed for cough.   febuxostat 40 MG tablet Commonly known as:  ULORIC Take 1 tablet (40 mg total) by mouth daily.   fluocinonide cream 0.05 % Commonly known as:  LIDEX Apply 1 application topically 2 (two) times daily as needed (for itchy rash).   gabapentin 300 MG capsule Commonly known as:  NEURONTIN Take 1 capsule (300 mg total) by mouth at bedtime.   hydrALAZINE 50 MG tablet Commonly known as:  APRESOLINE Take 1 tablet (50 mg total) 3 (three) times daily by mouth.   metolazone 5 MG tablet Commonly known as:  ZAROXOLYN Take 1 pill as directed by HF clinic if weight goes up 3 lbs overnight, or 5 lbs within one week. Call before taking.   NASACORT AQ 55 MCG/ACT Aero nasal inhaler Generic drug:  triamcinolone Place 2 sprays into the nose daily as needed (seasonal allergies).   omalizumab 150 MG injection Commonly known as:  XOLAIR Inject 300 mg into the skin every 14 (fourteen) days. Last injection given 04/25/17   polyethylene glycol packet Commonly known as:  MIRALAX / GLYCOLAX Take 17 g by mouth daily as needed.   potassium chloride 10 MEQ CR capsule Commonly known as:  MICRO-K Take 1 capsule (10 mEq total) by mouth daily as needed (with each metolazone dose).   tadalafil 20 MG tablet Commonly known as:  CIALIS Take 20 mg by mouth daily as needed for erectile  dysfunction. Do not exceed 2 doses in 7 days   testosterone cypionate 100 MG/ML injection Commonly known as:  DEPOTESTOTERONE CYPIONATE Inject 400 mg into the muscle every 21 ( twenty-one) days. For IM use only   torsemide 20 MG tablet Commonly known as:  DEMADEX Take 5 tablets (100 mg total) by mouth daily.   zolpidem 10 MG tablet Commonly known as:  AMBIEN TAKE 1/2 TO 1 (ONE-HALF TO ONE) TABLET BY MOUTH AT BEDTIME AS NEEDED FOR SLEEP      Follow-up Information  Baldwin Jamaica, PA-C Follow up on 09/05/2017.   Specialty:  Cardiology Why:  8:00AM Contact information: [address]  [place] [address] [phone]        Madelon Lips, MD Follow up on 09/02/2017.   Specialty:  Nephrology Why:  at 12:00  Contact information: [place] [address] [phone]        [name], MD. Schedule an appointment as soon as possible for a visit in 1 week(s).   Specialty:  Internal Medicine Contact information: [place] [address] [phone]          Allergies  Allergen Reactions  . Codeine Other (See Comments)    anxiety  . Hydrocodone Other (See Comments)    Anxiety- can take in liquid form  . Pseudoephedrine Other (See Comments)    Causes heart to race  . Azithromycin Rash    Consultations: Cardiologist  Procedures/Studies: None  Subjective: Seen and examined at bedside.  Reported doing well.  Denies headache, dizziness, nausea, vomiting, chest pain, shortness of breath.  Discharge Exam: Vitals:   08/27/17 2114 08/28/17 0547  BP: (!) 143/61 (!) 155/70  Pulse: 88 93  Resp: 20 20  Temp: 98.7 F (37.1 C) 98.6 F (37 C)  SpO2: 94% 91%   Vitals:   08/27/17 1338 08/27/17 1643 08/27/17 2114 08/28/17 0547  BP: 128/66 129/66 (!) 143/61 (!) 155/70  Pulse: 77  88 93  Resp: 20  20 20   Temp: 98.2 F (36.8 C)  98.7 F (37.1 C) 98.6 F (37 C)  TempSrc: Oral  Oral Oral  SpO2: 94%  94% 91%  Weight:       Height:        General: Pt is alert, awake, not in acute distress Cardiovascular: RRR, S1/S2 +, no rubs, no gallops Respiratory: CTA bilaterally, no wheezing, no rhonchi Abdominal: Soft, NT, ND, bowel sounds + Extremities: Trace lower extremities dependent edema, no cyanosis    The results of significant diagnostics from this hospitalization (including imaging, microbiology, ancillary and laboratory) are listed below for reference.     Microbiology: Recent Results (from the past 240 hour(s))  MRSA PCR Screening     Status: None   Collection Time: 08/23/17  1:43 AM  Result Value Ref Range Status   MRSA by PCR NEGATIVE NEGATIVE Final    Comment:        The GeneXpert MRSA Assay (FDA approved for NASAL specimens only), is one component of a comprehensive MRSA colonization surveillance program. It is not intended to diagnose MRSA infection nor to guide or monitor treatment for MRSA infections.      Labs: BNP (last 3 results) Recent Labs    05/01/17 1108 08/22/17 1149 08/27/17 0828  BNP 712.3* 918.3* 789.3*   Basic Metabolic Panel: Recent Labs  Lab 08/22/17 1139  08/24/17 0332 08/25/17 0300 08/26/17 0423 08/27/17 0243 08/28/17 0459  NA 137   < > 133* 133* 130* 130* 132*  K 2.6*   < > 3.5 3.6 4.2 4.3 4.1  CL 96*   < > 94* 95* 93* 93* 96*  CO2 28   < > 24 25 22 22 23   GLUCOSE 155*   < > 134* 117* 156* 131* 129*  BUN 86*   < > 76* 83* 88* 98* 86*  CREATININE 2.67*   < > 2.69* 3.13* 3.29* 3.43* 3.13*  CALCIUM 9.1   < > 8.6* 8.2* 8.2* 8.3* 8.6*  MG 2.2  --   --  2.0  --   --   --   PHOS  --   --  2.9 3.2 3.1  --  3.0   < > = values in this interval not displayed.   Liver Function Tests: Recent Labs  Lab 08/22/17 1139 08/24/17 0332 08/25/17 0300 08/26/17 0423 08/28/17 0459  AST 17  --   --   --   --   ALT 17  --   --   --   --   ALKPHOS 58  --   --   --   --   BILITOT 1.1  --   --   --   --   PROT 6.3*  --   --   --   --   ALBUMIN 3.3* 3.1* 2.9* 3.0*  3.3*   No results for input(s): LIPASE, AMYLASE in the last 168 hours. No results for input(s): AMMONIA in the last 168 hours. CBC: Recent Labs  Lab 08/22/17 1139 08/23/17 0348 08/26/17 0423 08/28/17 0459  WBC 6.7 8.1 8.2 7.5  NEUTROABS 4.8  --   --   --   HGB 10.5* 11.3* 10.6* 10.3*  HCT 33.8* 36.8* 34.0* 33.1*  MCV 93.4 93.9 92.9 93.5  PLT 116* 114* 122* 132*   Cardiac Enzymes: Recent Labs  Lab 08/22/17 1139  TROPONINI 0.07*   BNP: Invalid input(s): POCBNP CBG: Recent Labs  Lab 08/27/17 0750 08/27/17 1207 08/27/17 1610 08/27/17 2112 08/28/17 0748  GLUCAP 153* 134* 107* 136* 132*   D-Dimer No results for input(s): DDIMER in the last 72 hours. Hgb A1c No results for input(s): HGBA1C in the last 72 hours. Lipid Profile No results for input(s): CHOL, HDL, LDLCALC, TRIG, CHOLHDL, LDLDIRECT in the last 72 hours. Thyroid function studies No results for input(s): TSH, T4TOTAL, T3FREE, THYROIDAB in the last 72 hours.  Invalid input(s): FREET3 Anemia work up No results for input(s): VITAMINB12, FOLATE, FERRITIN, TIBC, IRON, RETICCTPCT in the last 72 hours. Urinalysis    Component Value Date/Time   COLORURINE YELLOW 08/25/2017 1630   APPEARANCEUR CLEAR 08/25/2017 1630   LABSPEC 1.012 08/25/2017 1630   PHURINE 5.0 08/25/2017 1630   GLUCOSEU NEGATIVE 08/25/2017 1630   GLUCOSEU NEGATIVE 06/29/2012 1218   HGBUR NEGATIVE 08/25/2017 1630   BILIRUBINUR NEGATIVE 08/25/2017 1630   KETONESUR NEGATIVE 08/25/2017 1630   PROTEINUR 100 (A) 08/25/2017 1630   UROBILINOGEN 0.2 10/05/2014 2249   NITRITE NEGATIVE 08/25/2017 1630   LEUKOCYTESUR NEGATIVE 08/25/2017 1630   Sepsis Labs Invalid input(s): PROCALCITONIN,  WBC,  LACTICIDVEN Microbiology Recent Results (from the past 240 hour(s))  MRSA PCR Screening     Status: None   Collection Time: 08/23/17  1:43 AM  Result Value Ref Range Status   MRSA by PCR NEGATIVE NEGATIVE Final    Comment:        The GeneXpert MRSA Assay  (FDA approved for NASAL specimens only), is one component of a comprehensive MRSA colonization surveillance program. It is not intended to diagnose MRSA infection nor to guide or monitor treatment for MRSA infections.      Time coordinating discharge: 34 minutes  SIGNED:   Rosita Fire, MD  [facility] 08/28/2017, 10:35 AM  If 7PM-7AM, please contact night-coverage [email or web address] Password [email or web address]

## 2017-08-29 ENCOUNTER — Telehealth: Payer: Self-pay | Admitting: Internal Medicine

## 2017-08-29 ENCOUNTER — Telehealth: Payer: Self-pay | Admitting: *Deleted

## 2017-08-29 DIAGNOSIS — I472 Ventricular tachycardia: Secondary | ICD-10-CM | POA: Diagnosis not present

## 2017-08-29 DIAGNOSIS — E876 Hypokalemia: Secondary | ICD-10-CM | POA: Diagnosis not present

## 2017-08-29 MED ORDER — AMIODARONE HCL 200 MG PO TABS: 200.0000 mg | ORAL_TABLET | Freq: Two times a day (BID) | ORAL | 0 refills | Status: DC

## 2017-08-29 NOTE — Telephone Encounter (Signed)
Okay to fill? 

## 2017-08-29 NOTE — Telephone Encounter (Signed)
Copied from [place]. Topic: Quick Communication - See Telephone Encounter >> Aug 29, 2017 10:49 AM Arletha Grippe wrote: CRM for notification. See Telephone encounter for:   08/29/17. Pt was released from hosp yesterday - he did not receive rx amiodarone (PACERONE) 200 MG tablet. It was not sent home with him.  [facility] on [address]. Shradda from [place] called

## 2017-08-29 NOTE — Telephone Encounter (Signed)
Pacerone 200 mg was printed at hospital- not given to patient. This will be a new Rx for this provider. 08/28/2017  LOV 07/21/2017   Pharmacy: verified current

## 2017-08-29 NOTE — Telephone Encounter (Signed)
Ok to fill but should them get it from cardiology

## 2017-08-29 NOTE — Telephone Encounter (Signed)
Transition Care Management Follow-up Telephone Call   Date discharged? 08/28/17   How have you been since you were released from the hospital? Pt states he seem to be doing ok   Do you understand why you were in the hospital? YES   Do you understand the discharge instructions? YES   Where were you discharged to? Home   Items Reviewed:  Medications reviewed: YES  Allergies reviewed: YES  Dietary changes reviewed: NO  Referrals reviewed: YES, pt states he have several appt already set-up. Will be seeing pulmonologist next week, cardiology appt 09/05/17   Functional Questionnaire:   Activities of Daily Living (ADLs):   He states he are independent in the following: bathing and hygiene, feeding, continence, grooming, toileting and dressing States he require assistance with the following: ambulation   Any transportation issues/concerns?: NO   Any patient concerns? NO   Confirmed importance and date/time of follow-up visits scheduled YES, appt 09/08/17  Provider Appointment booked with Dr. [name]  Confirmed with patient if condition begins to worsen call PCP or go to the ER.  Patient was given the office number and encouraged to call back with question or concerns.  : YES

## 2017-08-29 NOTE — Telephone Encounter (Signed)
Copied from [place]. Topic: Quick Communication - See Telephone Encounter >> Aug 29, 2017 10:50 AM Arletha Grippe wrote: CRM for notification. See Telephone encounter for:   08/29/17.shradda from [place] called - verbal order for home health  Nursing 1 week1  2 week2  1 week1  [phone]

## 2017-08-29 NOTE — Telephone Encounter (Signed)
Spoke with pt to inform.  

## 2017-08-30 DIAGNOSIS — E876 Hypokalemia: Secondary | ICD-10-CM | POA: Diagnosis not present

## 2017-08-30 DIAGNOSIS — I472 Ventricular tachycardia: Secondary | ICD-10-CM | POA: Diagnosis not present

## 2017-09-01 ENCOUNTER — Telehealth: Payer: Self-pay

## 2017-09-01 ENCOUNTER — Telehealth: Payer: Self-pay | Admitting: Internal Medicine

## 2017-09-01 ENCOUNTER — Ambulatory Visit: Payer: Medicare Other | Admitting: Internal Medicine

## 2017-09-01 DIAGNOSIS — E876 Hypokalemia: Secondary | ICD-10-CM | POA: Diagnosis not present

## 2017-09-01 DIAGNOSIS — I472 Ventricular tachycardia: Secondary | ICD-10-CM | POA: Diagnosis not present

## 2017-09-01 NOTE — Telephone Encounter (Signed)
LVM rescheduled apt from 2/1@ 8am w/ Tommye Standard to 1/30@ 1:00pm

## 2017-09-01 NOTE — Telephone Encounter (Signed)
Copied from [place]. Topic: Inquiry >> Sep 01, 2017  3:26 PM Scherrie Gerlach wrote: 1 wk /4  for exerecise adl transfers, iadl, dc when goals met

## 2017-09-01 NOTE — Telephone Encounter (Signed)
Noted, ok

## 2017-09-01 NOTE — Progress Notes (Deleted)
Cardiology Office Note Date:  09/01/2017  Patient ID:  Shane [name], DOB [birth year], MRN [id number] PCP:  [name], MD  Cardiologist:  Dr. Acie Fredrickson AHF: Dr. Haroldine Laws Electrophysiologist: Dr. Caryl Comes Nephrologist: Dr. Hollie Salk   ***refresh   Chief Complaint: post hospital visit  History of Present Illness: Shane [name] is a 84 y.o. male with history of CAD (remote CABG 1995, last cath in 2003), CM, chronic CHF (systolic), CRI (IV, has AVF not yet on HD), LBBB,  March 2016 hospitalized w/VT underwent CRT-D implant, DM, HTN, HLD.  He was admitted to [facility] 08/22/17 after being woken by shock from his ICD.  Given recent episodes of syncope and ICD therapies in the last week or so he came in.  The patient of late has been feeling well, no CP, palpitations, he denies any changes to his exertional capacity, no symptoms of PND or orthopnea.  With his 2 syncopal events one while seated collapsed forward to the table, the other was in the BR and though he had fallen, this was Saturday last weekend and was called by our office informed he had been shocked, no changes were made to therapy, instructed not to drive, pending further recommendations from Dr. Caryl Comes.  His device was interrogated noting Battery stable + 25J shock this AM, appropriate for PMVT in VF zone, cycle length 170-23ms On 08/18/17 he has MMVT treated successfully with 2 rounds of ATP On 08/17/17 he had MMVT treated successfully with one ATP On 08/16/17 he had 2 ATP 2nd round accelerated to VF zone and got 25J shock successful RV threshold is high 4.25V/1.42ms, 98.9% LV pacing (BiVe 1.1%),  He was found markedly hypokalemic, with K+ 2.6 admitted to medicine team for electrolyte management given his CRI and started on amiodarone.  Given lack of any ischemic symptoms, no ischemic w/u was pursued.  He was discharged 08/28/17.  *** symptoms *** PMD f/u? Nephrology?? *** meds, CM, CAD *** fluid status *** amio labs *** RV  thresholds??? *** lipids/labs *** thoughts are to leave BP some, to improve renal pefusion   Device information: MDT CRT-D, implanted 10/11/14 + hx of appropriate therapy for PMVT, Jan 2019 AAD: amiodarone started Jan 2019    Past Medical History:  Diagnosis Date  . Abnormal nuclear cardiac imaging test Nov 2010   moderate area of infarct in the inferior wall with only minimal reversibility and EF of 28%  . AICD (automatic cardioverter/defibrillator) present   . Allergic rhinitis 01-08-13   Uses nebulizer for chronic sinus issues and Mucinex.  . Arthritis    osteoarthritis   . Asthma    Extrinic  . Blood transfusion    ? at time of bypass surgery   . BPH (benign prostatic hyperplasia)   . Cellulitis of arm, left    MSSA  . CHF (congestive heart failure) ([place])   . CKD (chronic kidney disease), stage III ([place])    "was told due to meds he takes"-no Renal workup done  . Colon polyps    adenomatous  . Coronary artery disease    remote CABG in 1985, cath in 2003 by Dr. Lia Foyer with no PCI, last nuclear in 2010 showing scar and EF of 28%.   . Diabetes mellitus   . Diabetic neuropathy ([place]) 04/25/2017  . Dyslipidemia   . Dyspnea   . Gait abnormality 04/25/2017  . Glaucoma 01-08-13   tx. eye drops  . HOH (hard of hearing)   . HTN (hypertension)   .  Hypercholesterolemia   . Left ventricular dysfunction    28% per nuclear in 2010 and 35 to 40% per echo in 2010  . Myocardial infarction ([place])    1985  . Neuromuscular disorder ([place])    legs mild paralysis-able to walk"nerve damage"-legs- left leg brace   . Neuropathy   . Pneumonia    hx of several times years ago   . Spinal stenosis     Past Surgical History:  Procedure Laterality Date  . A/V FISTULAGRAM Right 10/17/2016   Procedure: A/V Fistulagram;  Surgeon: Conrad [place] George, MD;  Location: [facility];  Service: Cardiovascular;  Laterality: Right;  . BACK SURGERY     hx of back surgery x 4   . BASCILIC VEIN TRANSPOSITION  Right 09/09/2016   Procedure: BASCILIC VEIN TRANSPOSITION Right Arm;  Surgeon: Rosetta Posner, MD;  Location: [facility] OR;  Service: Vascular;  Laterality: Right;  . BI-VENTRICULAR IMPLANTABLE CARDIOVERTER DEFIBRILLATOR N/A 10/10/2014   Procedure: BI-VENTRICULAR IMPLANTABLE CARDIOVERTER DEFIBRILLATOR  (CRT-D);  Surgeon: [name], MD;  Location: [facility] CATH LAB;  Service: Cardiovascular;  Laterality: N/A;  . CARDIAC CATHETERIZATION  2003  . Cataract      recent cataract surgery 6'14  . COLONOSCOPY N/A 01/25/2013   Procedure: COLONOSCOPY;  Surgeon: Irene Shipper, MD;  Location: [facility];  Service: Endoscopy;  Laterality: N/A;  . CORONARY ARTERY BYPASS GRAFT  1985   x 5 vessels  . LUMBAR DISC SURGERY    . PERIPHERAL VASCULAR BALLOON ANGIOPLASTY Right 10/17/2016   Procedure: Peripheral Vascular Balloon Angioplasty;  Surgeon: Conrad [place], MD;  Location: [facility];  Service: Cardiovascular;  Laterality: Right;  ARM VEINOUS AND CENTRAL VEIN  . PR POLYSOM 6/>YRS SLEEP 4/> ADDL PARAM ATTND  12/07/2015  . PROSTATE SURGERY    . TOTAL KNEE ARTHROPLASTY  08/01/2011   Procedure: TOTAL KNEE ARTHROPLASTY;  Surgeon: Johnn Hai;  Location: [facility] ORS;  Service: Orthopedics;  Laterality: Right;    Current Outpatient Medications  Medication Sig Dispense Refill  . acetaminophen (TYLENOL) 500 MG tablet Take 1,000 mg by mouth every 8 (eight) hours as needed (pain).     Marland Kitchen albuterol (PROVENTIL HFA;VENTOLIN HFA) 108 (90 Base) MCG/ACT inhaler Inhale 2 puffs into the lungs every 4 (four) hours as needed for wheezing or shortness of breath (cough, shortness of breath or wheezing.). 1 Inhaler 1  . amiodarone (PACERONE) 200 MG tablet Take 1 tablet (200 mg total) by mouth 2 (two) times daily. 60 tablet 0  . amLODipine (NORVASC) 5 MG tablet Take 1 tablet (5 mg total) by mouth daily. 30 tablet 6  . aspirin EC 81 MG tablet Take 81 mg by mouth daily.     Marland Kitchen atorvastatin (LIPITOR) 10 MG tablet Take 1 tablet (10 mg total) by  mouth daily. 90 tablet 3  . carvedilol (COREG) 6.25 MG tablet Take 1 tablet (6.25 mg total) by mouth 2 (two) times daily with a meal. 60 tablet 5  . cetirizine (ZYRTEC) 10 MG tablet Take 10 mg by mouth at bedtime as needed (seasonal allergies).     Marland Kitchen dextromethorphan-guaiFENesin (MUCINEX DM) 30-600 MG 12hr tablet Take 1 tablet by mouth 2 (two) times daily as needed for cough. 20 tablet 0  . febuxostat (ULORIC) 40 MG tablet Take 1 tablet (40 mg total) by mouth daily. 90 tablet 3  . fluocinonide cream (LIDEX) 1.02 % Apply 1 application topically 2 (two) times daily as needed (for itchy rash).     Marland Kitchen  gabapentin (NEURONTIN) 300 MG capsule Take 1 capsule (300 mg total) by mouth at bedtime. 30 capsule 1  . hydrALAZINE (APRESOLINE) 50 MG tablet Take 1 tablet (50 mg total) 3 (three) times daily by mouth. 90 tablet 6  . metolazone (ZAROXOLYN) 5 MG tablet Take 1 pill as directed by HF clinic if weight goes up 3 lbs overnight, or 5 lbs within one week. Call before taking. 15 tablet 3  . omalizumab (XOLAIR) 150 MG injection Inject 300 mg into the skin every 14 (fourteen) days. Last injection given 04/25/17    . polyethylene glycol (MIRALAX / GLYCOLAX) packet Take 17 g by mouth daily as needed. 14 each 0  . potassium chloride (MICRO-K) 10 MEQ CR capsule Take 1 capsule (10 mEq total) by mouth daily as needed (with each metolazone dose). 30 capsule 6  . tadalafil (CIALIS) 20 MG tablet Take 20 mg by mouth daily as needed for erectile dysfunction. Do not exceed 2 doses in 7 days    . testosterone cypionate (DEPOTESTOTERONE CYPIONATE) 100 MG/ML injection Inject 400 mg into the muscle every 21 ( twenty-one) days. For IM use only    . torsemide (DEMADEX) 20 MG tablet Take 5 tablets (100 mg total) by mouth daily. 150 tablet 6  . triamcinolone (NASACORT AQ) 55 MCG/ACT AERO nasal inhaler Place 2 sprays into the nose daily as needed (seasonal allergies).     . zolpidem (AMBIEN) 10 MG tablet TAKE 1/2 TO 1 (ONE-HALF TO ONE)  TABLET BY MOUTH AT BEDTIME AS NEEDED FOR SLEEP 30 tablet 1   Current Facility-Administered Medications  Medication Dose Route Frequency Provider Last Rate Last Dose  . omalizumab Arvid Right) injection 300 mg  300 mg Subcutaneous Q14 Days Javier Glazier, MD   300 mg at 04/25/17 1213  . omalizumab Arvid Right) injection 300 mg  300 mg Subcutaneous Q14 Days Javier Glazier, MD   300 mg at 05/12/17 0946  . omalizumab Arvid Right) injection 300 mg  300 mg Subcutaneous Q14 Days Javier Glazier, MD   300 mg at 06/20/17 0109  . omalizumab Arvid Right) injection 300 mg  300 mg Subcutaneous Q14 Days Parrett, Tammy S, NP   300 mg at 07/18/17 1358  . omalizumab Arvid Right) injection 300 mg  300 mg Subcutaneous Q14 Days Parrett, Tammy S, NP   300 mg at 08/19/17 1116    Allergies:   Codeine; Hydrocodone; Pseudoephedrine; and Azithromycin   Social History:  The patient  reports that he quit smoking about 59 years ago. His smoking use included cigarettes, pipe, and cigars. He has a 5.00 pack-year smoking history. he has never used smokeless tobacco. He reports that he does not drink alcohol or use drugs.   Family History:  The patient's family history includes Bone cancer in his brother; Colon polyps in his father; Diabetes in his sister; Heart attack in his father; Heart disease in his brother and father; Lung cancer in his mother.  ROS:  Please see the history of present illness.  All other systems are reviewed and otherwise negative.   PHYSICAL EXAM: *** VS:  There were no vitals taken for this visit. BMI: There is no height or weight on file to calculate BMI. Well nourished, well developed, in no acute distress  HEENT: normocephalic, atraumatic  Neck: no JVD, carotid bruits or masses Cardiac:  *** RRR; no significant murmurs, no rubs, or gallops Lungs:  *** CTA b/l, no wheezing, rhonchi or rales  Abd: soft, nontender MS: no deformity or *** atrophy  Ext: no *** edema  Skin: warm and dry, no rash Neuro:  No  gross deficits appreciated Psych: euthymic mood, full affect  *** ICD site is stable, no tethering or discomfort   EKG:  Not done today ICD interrogation done today and reviewed by myself: ***  11/16/15: TTE Study Conclusions - Left ventricle: The cavity size was moderately dilated. Wall thickness was normal. Systolic function was mildly to moderately reduced. The estimated ejection fraction was in the range of 40% to 45%. Findings consistent with left ventricular diastolic dysfunction. - Regional wall motion abnormality: Akinesis of the basal inferior and basal inferolateral myocardium. - Aortic valve: Borderline mild aortic stenosis. Moderately calcified annulus. Trileaflet; mildly thickened, mildly calcified leaflets. Peak velocity (S): 225 cm/s. Mean gradient (S): 12 mm Hg. Valve area (VTI): 2.02 cm^2. Valve area (Vmax): 2.13 cm^2. Valve area (Vmean): 2.08 cm^2. - Mitral valve: There was mild regurgitation. - Left atrium: The atrium was moderately dilated. - Right ventricle: Pacer wire or catheter noted in right ventricle. Systolic function was mildly reduced. - Right atrium: Pacer wire or catheter noted in right atrium. - Tricuspid valve: There was mild regurgitation. - Pulmonary arteries: PA peak pressure: 31 mm Hg (S).    Recent Labs: 08/22/2017: ALT 17; TSH 1.243 08/25/2017: Magnesium 2.0 08/27/2017: B Natriuretic Peptide 668.3 08/28/2017: BUN 86; Creatinine, Ser 3.13; Hemoglobin 10.3; Platelets 132; Potassium 4.1; Sodium 132  No results found for requested labs within last 8760 hours.   Estimated Creatinine Clearance: 21 mL/min (A) (by C-G formula based on SCr of 3.13 mg/dL (H)).   Wt Readings from Last 3 Encounters:  08/27/17 207 lb 12.8 oz (94.3 kg)  07/24/17 204 lb (92.5 kg)  07/21/17 206 lb (93.4 kg)     Other studies reviewed: Additional studies/records reviewed today include: summarized above  ASSESSMENT AND PLAN:  1. PMVT, hx of VT     ***    Amiodarone  2. ICD     ***  3. CAD     ***  4. HTN     ***     Disposition: F/u with ***  Current medicines are reviewed at length with the patient today.  The patient did not have any concerns regarding medicines.***  Signed, Tommye Standard, PA-C 09/01/2017 7:50 AM     [facility] [place] [address] [phone] (office)  [phone] (fax)

## 2017-09-02 DIAGNOSIS — N189 Chronic kidney disease, unspecified: Secondary | ICD-10-CM | POA: Diagnosis not present

## 2017-09-02 DIAGNOSIS — J40 Bronchitis, not specified as acute or chronic: Secondary | ICD-10-CM | POA: Diagnosis not present

## 2017-09-02 DIAGNOSIS — E876 Hypokalemia: Secondary | ICD-10-CM | POA: Diagnosis not present

## 2017-09-02 DIAGNOSIS — D631 Anemia in chronic kidney disease: Secondary | ICD-10-CM | POA: Diagnosis not present

## 2017-09-02 DIAGNOSIS — N184 Chronic kidney disease, stage 4 (severe): Secondary | ICD-10-CM | POA: Diagnosis not present

## 2017-09-02 DIAGNOSIS — I472 Ventricular tachycardia: Secondary | ICD-10-CM | POA: Diagnosis not present

## 2017-09-02 DIAGNOSIS — I5022 Chronic systolic (congestive) heart failure: Secondary | ICD-10-CM | POA: Diagnosis not present

## 2017-09-02 DIAGNOSIS — I129 Hypertensive chronic kidney disease with stage 1 through stage 4 chronic kidney disease, or unspecified chronic kidney disease: Secondary | ICD-10-CM | POA: Diagnosis not present

## 2017-09-02 NOTE — Telephone Encounter (Signed)
Timmothy Sours called back and stated verbal orders are needed and will fax over form.

## 2017-09-02 NOTE — Telephone Encounter (Signed)
LVM for Timmothy Sours to call back to get more information from 09/01/17 phone note.

## 2017-09-03 ENCOUNTER — Telehealth: Payer: Self-pay | Admitting: Adult Health

## 2017-09-03 ENCOUNTER — Encounter (INDEPENDENT_AMBULATORY_CARE_PROVIDER_SITE_OTHER): Payer: Self-pay

## 2017-09-03 ENCOUNTER — Ambulatory Visit (INDEPENDENT_AMBULATORY_CARE_PROVIDER_SITE_OTHER): Payer: Medicare Other | Admitting: Physician Assistant

## 2017-09-03 VITALS — BP 128/58 | HR 70 | Ht 70.0 in | Wt 210.0 lb

## 2017-09-03 DIAGNOSIS — E876 Hypokalemia: Secondary | ICD-10-CM | POA: Diagnosis not present

## 2017-09-03 DIAGNOSIS — I5033 Acute on chronic diastolic (congestive) heart failure: Secondary | ICD-10-CM

## 2017-09-03 DIAGNOSIS — I2510 Atherosclerotic heart disease of native coronary artery without angina pectoris: Secondary | ICD-10-CM | POA: Diagnosis not present

## 2017-09-03 DIAGNOSIS — I255 Ischemic cardiomyopathy: Secondary | ICD-10-CM | POA: Diagnosis not present

## 2017-09-03 DIAGNOSIS — I472 Ventricular tachycardia, unspecified: Secondary | ICD-10-CM

## 2017-09-03 DIAGNOSIS — I10 Essential (primary) hypertension: Secondary | ICD-10-CM | POA: Diagnosis not present

## 2017-09-03 NOTE — Progress Notes (Addendum)
Cardiology Office Note Date:  09/01/2017  Patient ID:  Shane [name], DOB 11-05-1933, MRN [id number] PCP:  [name], MD       Cardiologist:  Dr. Acie Fredrickson AHF: Dr. Haroldine Laws Electrophysiologist: Dr. Caryl Comes Nephrologist: Dr. Hollie Salk     Chief Complaint: post hospital visit  History of Present Illness: Shane [name] is a 84 y.o. male with history of CAD (remote CABG 1995, last cath in 2003), CM, chronic CHF (systolic), CRI (IV, has AVF not yet on HD), LBBB, March 2016 hospitalized w/VT underwent CRT-D implant, DM, HTN, HLD.  He was admitted to [facility] 08/22/17 after being woken by shock from his ICD. Given recent episodes of syncope and ICD therapies in the last week or so he came in. The patient of late has been feeling well, no CP, palpitations, he denies any changes to his exertional capacity, no symptoms of PND or orthopnea. With his 2 syncopal events one while seated collapsed forward to the table, the other was in the BR and though he had fallen, this was Saturday last weekend and was called by our office informed he had been shocked, no changes were made to therapy, instructed not to drive, pending further recommendations from Dr. Caryl Comes.  His device was interrogated noting Battery stable + 25J shock this AM, appropriate for PMVT in VF zone, cycle length 170-265ms On 08/18/17 he has MMVT treated successfully with 2 rounds of ATP On 08/17/17 he had MMVT treated successfully with one ATP On 08/16/17 he had 2 ATP 2nd round accelerated to VF zone and got 25J shock successful RV threshold is high 4.25V/1.43ms, 98.9% LV pacing (BiVe 1.1%),  He was found markedly hypokalemic, with K+ 2.6 admitted to medicine team for electrolyte management given his CRI and started on amiodarone.  Given lack of any ischemic symptoms, and hypokalemia, no ischemic w/u was pursued.  He was discharged 08/28/17.  He has felt well since discharge, no near syncope or syncope, no shocks,.  No rest SOB, he  has a bed that inclines and is at his usual abut 25% incline (for years).  He thinks he may be retaining some fluid, saw his nephrologist yesterday to gave him instructions to take an extra metolazone and potassium tablet daily PRN for weight gain/swelling parameters given to him.  He has not had any kind of CP, up to his hospital stay/shocks really had been feeling well, no ischemic symptoms.     Device information: MDT CRT-D, implanted 10/11/14 + hx of appropriate therapy for MMVT and PMVT, Jan 2019, in setting of hypokalemia AAD: amiodarone started Jan 2019       Past Medical History:  Diagnosis Date  . Abnormal nuclear cardiac imaging test Nov 2010   moderate area of infarct in the inferior wall with only minimal reversibility and EF of 28%  . AICD (automatic cardioverter/defibrillator) present   . Allergic rhinitis 01-08-13   Uses nebulizer for chronic sinus issues and Mucinex.  . Arthritis    osteoarthritis   . Asthma    Extrinic  . Blood transfusion    ? at time of bypass surgery   . BPH (benign prostatic hyperplasia)   . Cellulitis of arm, left    MSSA  . CHF (congestive heart failure) ([place])   . CKD (chronic kidney disease), stage III ([place])    "was told due to meds he takes"-no Renal workup done  . Colon polyps    adenomatous  . Coronary artery disease    remote  CABG in 1985, cath in 2003 by Dr. Lia Foyer with no PCI, last nuclear in 2010 showing scar and EF of 28%.   . Diabetes mellitus   . Diabetic neuropathy ([place]) 04/25/2017  . Dyslipidemia   . Dyspnea   . Gait abnormality 04/25/2017  . Glaucoma 01-08-13   tx. eye drops  . HOH (hard of hearing)   . HTN (hypertension)   . Hypercholesterolemia   . Left ventricular dysfunction    28% per nuclear in 2010 and 35 to 40% per echo in 2010  . Myocardial infarction ([place])    1985  . Neuromuscular disorder ([place])    legs mild paralysis-able to walk"nerve damage"-legs- left leg brace   .  Neuropathy   . Pneumonia    hx of several times years ago   . Spinal stenosis          Past Surgical History:  Procedure Laterality Date  . A/V FISTULAGRAM Right 10/17/2016   Procedure: A/V Fistulagram;  Surgeon: Conrad Amherst, MD;  Location: [place] CV LAB;  Service: Cardiovascular;  Laterality: Right;  . BACK SURGERY     hx of back surgery x 4   . BASCILIC VEIN TRANSPOSITION Right 09/09/2016   Procedure: BASCILIC VEIN TRANSPOSITION Right Arm;  Surgeon: Rosetta Posner, MD;  Location: [facility] OR;  Service: Vascular;  Laterality: Right;  . BI-VENTRICULAR IMPLANTABLE CARDIOVERTER DEFIBRILLATOR N/A 10/10/2014   Procedure: BI-VENTRICULAR IMPLANTABLE CARDIOVERTER DEFIBRILLATOR  (CRT-D);  Surgeon: [name], MD;  Location: [facility] CATH LAB;  Service: Cardiovascular;  Laterality: N/A;  . CARDIAC CATHETERIZATION  2003  . Cataract      recent cataract surgery 6'14  . COLONOSCOPY N/A 01/25/2013   Procedure: COLONOSCOPY;  Surgeon: Irene Shipper, MD;  Location: [facility] ENDOSCOPY;  Service: Endoscopy;  Laterality: N/A;  . CORONARY ARTERY BYPASS GRAFT  1985   x 5 vessels  . LUMBAR DISC SURGERY    . PERIPHERAL VASCULAR BALLOON ANGIOPLASTY Right 10/17/2016   Procedure: Peripheral Vascular Balloon Angioplasty;  Surgeon: Conrad , MD;  Location: [place] CV LAB;  Service: Cardiovascular;  Laterality: Right;  ARM VEINOUS AND CENTRAL VEIN  . PR POLYSOM 6/>YRS SLEEP 4/> ADDL PARAM ATTND  12/07/2015  . PROSTATE SURGERY    . TOTAL KNEE ARTHROPLASTY  08/01/2011   Procedure: TOTAL KNEE ARTHROPLASTY;  Surgeon: Johnn Hai;  Location: [facility] ORS;  Service: Orthopedics;  Laterality: Right;          Current Outpatient Medications  Medication Sig Dispense Refill  . acetaminophen (TYLENOL) 500 MG tablet Take 1,000 mg by mouth every 8 (eight) hours as needed (pain).     Marland Kitchen albuterol (PROVENTIL HFA;VENTOLIN HFA) 108 (90 Base) MCG/ACT inhaler Inhale 2 puffs into the lungs every 4 (four) hours as  needed for wheezing or shortness of breath (cough, shortness of breath or wheezing.). 1 Inhaler 1  . amiodarone (PACERONE) 200 MG tablet Take 1 tablet (200 mg total) by mouth 2 (two) times daily. 60 tablet 0  . amLODipine (NORVASC) 5 MG tablet Take 1 tablet (5 mg total) by mouth daily. 30 tablet 6  . aspirin EC 81 MG tablet Take 81 mg by mouth daily.     Marland Kitchen atorvastatin (LIPITOR) 10 MG tablet Take 1 tablet (10 mg total) by mouth daily. 90 tablet 3  . carvedilol (COREG) 6.25 MG tablet Take 1 tablet (6.25 mg total) by mouth 2 (two) times daily with a meal. 60 tablet 5  . cetirizine (ZYRTEC) 10 MG  tablet Take 10 mg by mouth at bedtime as needed (seasonal allergies).     Marland Kitchen dextromethorphan-guaiFENesin (MUCINEX DM) 30-600 MG 12hr tablet Take 1 tablet by mouth 2 (two) times daily as needed for cough. 20 tablet 0  . febuxostat (ULORIC) 40 MG tablet Take 1 tablet (40 mg total) by mouth daily. 90 tablet 3  . fluocinonide cream (LIDEX) 2.80 % Apply 1 application topically 2 (two) times daily as needed (for itchy rash).     . gabapentin (NEURONTIN) 300 MG capsule Take 1 capsule (300 mg total) by mouth at bedtime. 30 capsule 1  . hydrALAZINE (APRESOLINE) 50 MG tablet Take 1 tablet (50 mg total) 3 (three) times daily by mouth. 90 tablet 6  . metolazone (ZAROXOLYN) 5 MG tablet Take 1 pill as directed by HF clinic if weight goes up 3 lbs overnight, or 5 lbs within one week. Call before taking. 15 tablet 3  . omalizumab (XOLAIR) 150 MG injection Inject 300 mg into the skin every 14 (fourteen) days. Last injection given 04/25/17    . polyethylene glycol (MIRALAX / GLYCOLAX) packet Take 17 g by mouth daily as needed. 14 each 0  . potassium chloride (MICRO-K) 10 MEQ CR capsule Take 1 capsule (10 mEq total) by mouth daily as needed (with each metolazone dose). 30 capsule 6  . tadalafil (CIALIS) 20 MG tablet Take 20 mg by mouth daily as needed for erectile dysfunction. Do not exceed 2 doses in 7 days    .  testosterone cypionate (DEPOTESTOTERONE CYPIONATE) 100 MG/ML injection Inject 400 mg into the muscle every 21 ( twenty-one) days. For IM use only    . torsemide (DEMADEX) 20 MG tablet Take 5 tablets (100 mg total) by mouth daily. 150 tablet 6  . triamcinolone (NASACORT AQ) 55 MCG/ACT AERO nasal inhaler Place 2 sprays into the nose daily as needed (seasonal allergies).     . zolpidem (AMBIEN) 10 MG tablet TAKE 1/2 TO 1 (ONE-HALF TO ONE) TABLET BY MOUTH AT BEDTIME AS NEEDED FOR SLEEP 30 tablet 1            Current Facility-Administered Medications  Medication Dose Route Frequency Provider Last Rate Last Dose  . omalizumab Arvid Right) injection 300 mg  300 mg Subcutaneous Q14 Days Javier Glazier, MD   300 mg at 04/25/17 1213  . omalizumab Arvid Right) injection 300 mg  300 mg Subcutaneous Q14 Days Javier Glazier, MD   300 mg at 05/12/17 0946  . omalizumab Arvid Right) injection 300 mg  300 mg Subcutaneous Q14 Days Javier Glazier, MD   300 mg at 06/20/17 0349  . omalizumab Arvid Right) injection 300 mg  300 mg Subcutaneous Q14 Days Parrett, Tammy S, NP   300 mg at 07/18/17 1358  . omalizumab Arvid Right) injection 300 mg  300 mg Subcutaneous Q14 Days Parrett, Tammy S, NP   300 mg at 08/19/17 1116    Allergies:   Codeine; Hydrocodone; Pseudoephedrine; and Azithromycin   Social History:  The patient  reports that he quit smoking about 59 years ago. His smoking use included cigarettes, pipe, and cigars. He has a 5.00 pack-year smoking history. he has never used smokeless tobacco. He reports that he does not drink alcohol or use drugs.   Family History:  The patient's family history includes Bone cancer in his brother; Colon polyps in his father; Diabetes in his sister; Heart attack in his father; Heart disease in his brother and father; Lung cancer in his mother.  ROS:  Please  see the history of present illness.  All other systems are reviewed and otherwise negative.   PHYSICAL EXAM:  VS:   128/58, 70bpm, 210lbs.  Patient reports his AM weight today was 203, has been there since discharge Well nourished, well developed, in no acute distress  HEENT: normocephalic, atraumatic  Neck: no JVD, carotid bruits or masses Cardiac:  RRR; no significant murmurs, no rubs, or gallops Lungs:  CTA b/l, no wheezing, rhonchi or rales  Abd: soft, nontender MS: no deformity, age appropriate atrophy Ext: trace edema, wears a brace R ankle Skin: warm and dry, no rash Neuro:  No gross deficits appreciated Psych: euthymic mood, full affect  ICD site is stable, no tethering or discomfort   EKG:  Not done today ICD interrogation done today and reviewed by myself: battery status is good, A lead measurements are good, RV lead threshold is down to 2.5V/1.36ms, impedence stable, LV lead measurements are stable.  No arrhythmias or therapies.  Optivol is trending up, just crossed threshold Of his V pacing, 99.7 is LV only, he only RV paces 0.3%, no changes were made to his programming  11/16/15: TTE Study Conclusions - Left ventricle: The cavity size was moderately dilated. Wall thickness was normal. Systolic function was mildly to moderately reduced. The estimated ejection fraction was in the range of 40% to 45%. Findings consistent with left ventricular diastolic dysfunction. - Regional wall motion abnormality: Akinesis of the basal inferior and basal inferolateral myocardium. - Aortic valve: Borderline mild aortic stenosis. Moderately calcified annulus. Trileaflet; mildly thickened, mildly calcified leaflets. Peak velocity (S): 225 cm/s. Mean gradient (S): 12 mm Hg. Valve area (VTI): 2.02 cm^2. Valve area (Vmax): 2.13 cm^2. Valve area (Vmean): 2.08 cm^2. - Mitral valve: There was mild regurgitation. - Left atrium: The atrium was moderately dilated. - Right ventricle: Pacer wire or catheter noted in right ventricle. Systolic function was mildly reduced. - Right atrium:  Pacer wire or catheter noted in right atrium. - Tricuspid valve: There was mild regurgitation. - Pulmonary arteries: PA peak pressure: 31 mm Hg (S).    Recent Labs: 08/22/2017: ALT 17; TSH 1.243 08/25/2017: Magnesium 2.0 08/27/2017: B Natriuretic Peptide 668.3 08/28/2017: BUN 86; Creatinine, Ser 3.13; Hemoglobin 10.3; Platelets 132; Potassium 4.1; Sodium 132  No results found for requested labs within last 8760 hours.   Estimated Creatinine Clearance: 21 mL/min (A) (by C-G formula based on SCr of 3.13 mg/dL (H)).      Wt Readings from Last 3 Encounters:  08/27/17 207 lb 12.8 oz (94.3 kg)  07/24/17 204 lb (92.5 kg)  07/21/17 206 lb (93.4 kg)     Other studies reviewed: Additional studies/records reviewed today include: summarized above  ASSESSMENT AND PLAN:  1. PMVT, hx of VT     In environment of K+ 2.6     Continue Amiodarone, will reduce to daily after a month of BID, on 09/22/17.     baseline TSH/LFTs done in hospital     Plan for f/u labs at next visit  2. ICD     RV thresholds down from hospital check, ?HF, shocks?     He never RV paces, impedance is good     His device was not programmed for auto threshold testing, I suspect 2/2 this     No reprogramming was done today  3. CAD      no anginal symptoms, he sees Dr. Acie Fredrickson in March      On ASA, BB, statin (low dose), will defer to Dr.  Nahser  4. HTN     looks OK  5. ICM, chronic CHF (mixed)    Last echo 40-45% (2016 was 35-40% by echo, 22% by myoview)    On BB, no ACE/ARB with renal disease, , on hydralazine and metolazone, K+     OptiVol trending up, just crossed threshold, he will follow his nephrologist with an extra dose of his diuretic/K+  6. CRI (IV)     Saw nephrologist yesterday     Disposition: F/u with Dr. Rebekah [facility] in the next couple weeks, he will follow his nephrologist guidelines for an additional metolazone/K+ dose for weight gain/edema, he will call today to follow up  on his labs from yesterday and make sure there are no changes needed.   F/U with Dr. Acie Fredrickson as scheduled, and Dr. Caryl Comes in 6-8 weeks, sooner if needed.    Current medicines are reviewed at length with the patient today.  The patient did not have any concerns regarding medicines.  Venetia Night, PA-C 09/01/2017 7:50 AM     [address] [place] [address] [phone] (office)  [phone] (fax)

## 2017-09-03 NOTE — Telephone Encounter (Signed)
Pt is aware of below message and voiced his understanding. Nothing further is needed.  

## 2017-09-03 NOTE — Telephone Encounter (Signed)
OK by me 

## 2017-09-03 NOTE — Patient Instructions (Addendum)
Medication Instructions:   STARTING 09-23-17 START TAKING AMIODARONE 200 MG ONCE A DAY   If you need a refill on your cardiac medications before your next appointment, please call your pharmacy.  Labwork: NONE ORDERED  TODAY   Testing/Procedures: NONE ORDERED  TODAY   Follow-Up:  IN 2 MONTHS WITH KLEIN OR [facility]    MOVE  DR BENSIMHOMN APPT UP TO 7 TO 10 DAYS  PER URSUY   Any Other Special Instructions Will Be Listed Below (If Applicable).

## 2017-09-03 NOTE — Telephone Encounter (Signed)
Called and spoke with pt. Pt was discharged on 08/28/17 for cough, Hypokalemia, ICD and AKI. Pt is scheduled for Xolair injection on 09/04/17. Pt currently has prod cough with white to yellow mucus & chest tightness.   BQ please advise if okay to have injection tomorrow. Thanks.

## 2017-09-03 NOTE — Telephone Encounter (Signed)
Faxed orders back to [facility] with MD signature.

## 2017-09-04 ENCOUNTER — Ambulatory Visit (INDEPENDENT_AMBULATORY_CARE_PROVIDER_SITE_OTHER): Payer: Medicare Other

## 2017-09-04 ENCOUNTER — Ambulatory Visit: Payer: Medicare Other | Admitting: Podiatry

## 2017-09-04 DIAGNOSIS — J4540 Moderate persistent asthma, uncomplicated: Secondary | ICD-10-CM

## 2017-09-05 ENCOUNTER — Encounter: Payer: Medicare Other | Admitting: Physician Assistant

## 2017-09-05 DIAGNOSIS — I472 Ventricular tachycardia: Secondary | ICD-10-CM | POA: Diagnosis not present

## 2017-09-05 DIAGNOSIS — E876 Hypokalemia: Secondary | ICD-10-CM | POA: Diagnosis not present

## 2017-09-05 MED ORDER — OMALIZUMAB 150 MG ~~LOC~~ SOLR
300.0000 mg | SUBCUTANEOUS | Status: DC
  Administered 2017-09-04: 300 mg via SUBCUTANEOUS

## 2017-09-05 NOTE — Progress Notes (Signed)
Documentation of medication administration and charges of Xolair have been completed by Naksh Radi, CMA based on the Xolair documentation sheet completed by Tammy Scott.  

## 2017-09-06 NOTE — Progress Notes (Signed)
Subjective:    Patient ID: Shane [name], male    DOB: 1934/05/02, 84 y.o.   MRN: [id number]  HPI The patient is here for follow up from the hospital.  Admitted 08/22/17 - 08/28/17 with chronic systolic heart failure, diabetes, htn, high col, CAD who went to the ED after his ICD shocked him.  It work him up.  He was evaluated by cardiology and his potassium of 2.6. He did state he had near-syncope and syncopal episodes a couple of days prior to this event.  He denied chest pain, palpitations, headaches, dizziness and shortness of breath.  He has not had any lower extremity edema.  The week prior he did receive a shock and informed cardiology.  The emergency room he was hemodynamically stable.  His potassium was repleted.  He was admitted to telemetry his potassium and magnesium levels were monitored and repleted as necessary.  He had a total of 3 ICD shocks.  He was evaluated by cardiology and recommended IV amiodarone.  The shocks were thought to be related to VT.  He had further evaluation of the syncope including blood work, and 2D echo.  He has chronic kidney disease and his kidney function was at baseline.  His chronic combined systolic and diastolic heart failure were thought to be compensated and he was continued on his home medications.  Zaroxolyn was initially held.  He was placed on sliding scale insulin for his diabetes that is diet controlled at home.  VT status post 3 ICD shocks, chronic combined systolic and diastolic heart failure, hypertension: Started on amiodarone Heart failure well compensated He has already followed up with cardiology He has felt fatigued since leaving the hospital, but it has gotten better  Hypokalemia Potassium was repleted Potassium prior to discharge 4.1 Her potassium was checked by his kidney doctor and was 4.3  Diabetes Diet controlled A1c 08/23/17 was 6.6  Chronic kidney disease, stage IV Kidney function stable Following with nephrology - had  blood work done recently.     Medications and allergies reviewed with patient and updated if appropriate.  Patient Active Problem List   Diagnosis Date Noted  . Hypokalemia 08/22/2017  . Elevated troponin 08/22/2017  . ICD (implantable cardioverter-defibrillator) discharge 08/22/2017  . Ventricular tachycardia ([place]) 08/22/2017  . Chronic gout of left knee 07/21/2017  . Diabetic neuropathy ([place]) 04/25/2017  . Gait abnormality 04/25/2017  . Numbness and tingling of both feet 03/04/2017  . Peripheral neuropathy 03/04/2017  . Carotid artery disease ([place]) 01/09/2017  . Chronic combined systolic and diastolic CHF (congestive heart failure) ([place]) 07/01/2016  . Restrictive lung disease 01/22/2016  . Severe obstructive sleep apnea 12/17/2015  . Essential hypertension 11/16/2015  . 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 LBBB (left bundle branch block) 10/10/2014  . Syncope 10/06/2014  . Pain in thumb joint with movement of right hand 01/04/2014  . Sinusitis, chronic 07/16/2013  . Asthma, moderate persistent 07/08/2013  . Chronic infection of sinus 07/31/2012  . Benign prostatic hypertrophy without urinary obstruction 12/18/2011  . CAD s/p coronary arthery bypass graft   . Chronic kidney disease (CKD), stage IV (severe) ([place])   . H/O Spinal stenosis   . DM (diabetes mellitus), type 2 with renal complications ([place]) 71/69/6789  . Benign hypertensive heart disease without heart failure 11/07/2010  . Allergic rhinitis 11/07/2010  . Osteoarthritis 11/07/2010  . Ischemic cardiomyopathy   . Hypercholesterolemia     Current Outpatient Medications on File Prior to Visit  Medication Sig Dispense Refill  .  acetaminophen (TYLENOL) 500 MG tablet Take 1,000 mg by mouth every 8 (eight) hours as needed (pain).     Marland Kitchen albuterol (PROVENTIL HFA;VENTOLIN HFA) 108 (90 Base) MCG/ACT inhaler Inhale 2 puffs into the lungs every 4 (four) hours as needed for wheezing or shortness of breath (cough, shortness of breath or  wheezing.). 1 Inhaler 1  . amiodarone (PACERONE) 200 MG tablet Take 1 tablet (200 mg total) by mouth 2 (two) times daily. 60 tablet 0  . amLODipine (NORVASC) 5 MG tablet Take 1 tablet (5 mg total) by mouth daily. 30 tablet 6  . aspirin EC 81 MG tablet Take 81 mg by mouth daily.     Marland Kitchen atorvastatin (LIPITOR) 10 MG tablet Take 1 tablet (10 mg total) by mouth daily. 90 tablet 3  . carvedilol (COREG) 6.25 MG tablet Take 1 tablet (6.25 mg total) by mouth 2 (two) times daily with a meal. 60 tablet 5  . cetirizine (ZYRTEC) 10 MG tablet Take 10 mg by mouth at bedtime as needed (seasonal allergies).     Marland Kitchen dextromethorphan-guaiFENesin (MUCINEX DM) 30-600 MG 12hr tablet Take 1 tablet by mouth 2 (two) times daily as needed for cough. 20 tablet 0  . febuxostat (ULORIC) 40 MG tablet Take 1 tablet (40 mg total) by mouth daily. 90 tablet 3  . fluocinonide cream (LIDEX) 5.99 % Apply 1 application topically 2 (two) times daily as needed (for itchy rash).     . gabapentin (NEURONTIN) 300 MG capsule Take 1 capsule (300 mg total) by mouth at bedtime. 30 capsule 1  . hydrALAZINE (APRESOLINE) 50 MG tablet Take 1 tablet (50 mg total) 3 (three) times daily by mouth. 90 tablet 6  . metolazone (ZAROXOLYN) 5 MG tablet Take 1 pill as directed by HF clinic if weight goes up 3 lbs overnight, or 5 lbs within one week. Call before taking. 15 tablet 3  . omalizumab (XOLAIR) 150 MG injection Inject 300 mg into the skin every 14 (fourteen) days. Last injection given 04/25/17    . polyethylene glycol (MIRALAX / GLYCOLAX) packet Take 17 g by mouth daily as needed. 14 each 0  . potassium chloride (MICRO-K) 10 MEQ CR capsule Take 1 capsule (10 mEq total) by mouth daily as needed (with each metolazone dose). 30 capsule 6  . tadalafil (CIALIS) 20 MG tablet Take 20 mg by mouth daily as needed for erectile dysfunction. Do not exceed 2 doses in 7 days    . testosterone cypionate (DEPOTESTOTERONE CYPIONATE) 100 MG/ML injection Inject 400 mg into  the muscle every 21 ( twenty-one) days. For IM use only    . torsemide (DEMADEX) 20 MG tablet Take 5 tablets (100 mg total) by mouth daily. 150 tablet 6  . triamcinolone (NASACORT AQ) 55 MCG/ACT AERO nasal inhaler Place 2 sprays into the nose daily as needed (seasonal allergies).     . zolpidem (AMBIEN) 10 MG tablet TAKE 1/2 TO 1 (ONE-HALF TO ONE) TABLET BY MOUTH AT BEDTIME AS NEEDED FOR SLEEP 30 tablet 1   Current Facility-Administered Medications on File Prior to Visit  Medication Dose Route Frequency Provider Last Rate Last Dose  . omalizumab Arvid Right) injection 300 mg  300 mg Subcutaneous Q14 Days Javier Glazier, MD   300 mg at 04/25/17 1213  . omalizumab Arvid Right) injection 300 mg  300 mg Subcutaneous Q14 Days Javier Glazier, MD   300 mg at 05/12/17 0946  . omalizumab Arvid Right) injection 300 mg  300 mg Subcutaneous Q14 Days Tera Partridge  E, MD   300 mg at 06/20/17 0918  . omalizumab Arvid Right) injection 300 mg  300 mg Subcutaneous Q14 Days Parrett, Tammy S, NP   300 mg at 07/18/17 1358  . omalizumab Arvid Right) injection 300 mg  300 mg Subcutaneous Q14 Days Parrett, Tammy S, NP   300 mg at 08/19/17 1116  . omalizumab Arvid Right) injection 300 mg  300 mg Subcutaneous Q14 Days Parrett, Tammy S, NP   300 mg at 09/04/17 1613    Past Medical History:  Diagnosis Date  . Abnormal nuclear cardiac imaging test Nov 2010   moderate area of infarct in the inferior wall with only minimal reversibility and EF of 28%  . AICD (automatic cardioverter/defibrillator) present   . Allergic rhinitis 01-08-13   Uses nebulizer for chronic sinus issues and Mucinex.  . Arthritis    osteoarthritis   . Asthma    Extrinic  . Blood transfusion    ? at time of bypass surgery   . BPH (benign prostatic hyperplasia)   . Cellulitis of arm, left    MSSA  . CHF (congestive heart failure) ([place])   . CKD (chronic kidney disease), stage III ([name])    "was told due to meds he takes"-no Renal workup done  . Colon polyps      adenomatous  . Coronary artery disease    remote CABG in 1985, cath in 2003 by Dr. Lia Foyer with no PCI, last nuclear in 2010 showing scar and EF of 28%.   . Diabetes mellitus   . Diabetic neuropathy ([place]) 04/25/2017  . Dyslipidemia   . Dyspnea   . Gait abnormality 04/25/2017  . Glaucoma 01-08-13   tx. eye drops  . HOH (hard of hearing)   . HTN (hypertension)   . Hypercholesterolemia   . Left ventricular dysfunction    28% per nuclear in 2010 and 35 to 40% per echo in 2010  . Myocardial infarction ([place])    1985  . Neuromuscular disorder ([place])    legs mild paralysis-able to walk"nerve damage"-legs- left leg brace   . Neuropathy   . Pneumonia    hx of several times years ago   . Spinal stenosis     Past Surgical History:  Procedure Laterality Date  . A/V FISTULAGRAM Right 10/17/2016   Procedure: A/V Fistulagram;  Surgeon: Conrad Telford, MD;  Location: [facility];  Service: Cardiovascular;  Laterality: Right;  . BACK SURGERY     hx of back surgery x 4   . BASCILIC VEIN TRANSPOSITION Right 09/09/2016   Procedure: BASCILIC VEIN TRANSPOSITION Right Arm;  Surgeon: Rosetta Posner, MD;  Location[facility];  Service: Vascular;  Laterality: Right;  . BI-VENTRICULAR IMPLANTABLE CARDIOVERTER DEFIBRILLATOR N/A 10/10/2014   Procedure: BI-VENTRICULAR IMPLANTABLE CARDIOVERTER DEFIBRILLATOR  (CRT-D);  Surgeon: [name], MD;  Location: [facility];  Service: Cardiovascular;  Laterality: N/A;  . CARDIAC CATHETERIZATION  2003  . Cataract      recent cataract surgery 6'14  . COLONOSCOPY N/A 01/25/2013   Procedure: COLONOSCOPY;  Surgeon: Irene Shipper, MD;  Location: [facility];  Service: Endoscopy;  Laterality: N/A;  . CORONARY ARTERY BYPASS GRAFT  1985   x 5 vessels  . LUMBAR DISC SURGERY    . PERIPHERAL VASCULAR BALLOON ANGIOPLASTY Right 10/17/2016   Procedure: Peripheral Vascular Balloon Angioplasty;  Surgeon: Conrad Independence, MD;  Location: [facility];  Service: Cardiovascular;   Laterality: Right;  ARM VEINOUS AND CENTRAL VEIN  .  PR POLYSOM 6/>YRS SLEEP 4/> ADDL PARAM ATTND  12/07/2015  . PROSTATE SURGERY    . TOTAL KNEE ARTHROPLASTY  08/01/2011   Procedure: TOTAL KNEE ARTHROPLASTY;  Surgeon: Johnn Hai;  Location: [facility];  Service: Orthopedics;  Laterality: Right;    Social History   Socioeconomic History  . Marital status: Married    Spouse name: None  . Number of children: 2  . Years of education: None  . Highest education level: None  Social Needs  . Financial resource strain: None  . Food insecurity - worry: None  . Food insecurity - inability: None  . Transportation needs - medical: None  . Transportation needs - non-medical: None  Occupational History  . Occupation: pastor  Tobacco Use  . Smoking status: Former Smoker    Packs/day: 1.00    Years: 5.00    Pack years: 5.00    Types: Cigarettes, Pipe, Cigars    Last attempt to quit: 08/05/1958    Years since quitting: 59.1  . Smokeless tobacco: Never Used  Substance and Sexual Activity  . Alcohol use: No  . Drug use: No  . Sexual activity: Not Currently  Other Topics Concern  . None  Social History Narrative   Originally from [place]. He has always lived in [place]. Prior travel to [place], [place], [place], [place] ,[place], & [place]. 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 Has adopted children. Has a dog currently. No bird exposure. No mold exposure. Enjoys reading & traveling.    Family History  Problem Relation Age of Onset  . Heart attack Father   . Heart disease Father   . Colon polyps Father   . Lung cancer Mother   . Diabetes Sister        x 2  . Heart disease Brother        x 2  . Bone cancer Brother   . Lung disease Neg Hx     Review of Systems  Constitutional: Positive for fatigue. Negative for chills and fever.  HENT: Negative for congestion, ear pain, postnasal drip, sinus pressure and sore throat.   Respiratory: Positive for cough (mucus from throat x 2  weeks - yellow). Negative for shortness of breath and wheezing.   Cardiovascular: Negative for chest pain, palpitations and leg swelling.  Gastrointestinal:       No gerd  Neurological: Negative for light-headedness and headaches.       Objective:   Vitals:   09/08/17 1318  BP: (!) 116/58  Pulse: 78  Resp: 16  Temp: 97.8 F (36.6 C)  SpO2: 96%   Wt Readings from Last 3 Encounters:  09/08/17 204 lb (92.5 kg)  09/03/17 210 lb (95.3 kg)  08/27/17 207 lb 12.8 oz (94.3 kg)   Body mass index is 29.27 kg/m.   Physical Exam    Constitutional: Appears well-developed and well-nourished. No distress.  HENT:  Head: Normocephalic and atraumatic.  Neck: Neck supple. No tracheal deviation present. No thyromegaly present.  No cervical lymphadenopathy Cardiovascular: Normal rate, regular rhythm and normal heart sounds.   No murmur heard. No carotid bruit .  No edema Pulmonary/Chest: Effort normal and breath sounds normal. No respiratory distress. No has no wheezes. No rales.  Skin: Skin is warm and dry. Not diaphoretic.  Psychiatric: Normal mood and affect. Behavior is normal.   US RENAL CLINICAL DATA:  Acute kidney injury  EXAM: RENAL / URINARY TRACT ULTRASOUND COMPLETE  COMPARISON:  None.  FINDINGS: Right Kidney:  Length: 11.1 cm. 13 mm exophytic cyst off the midpole which appears simple. Echogenicity within normal limits. No mass or hydronephrosis visualized.  Left Kidney:  Length: 12.2 cm. 2.2 cm exophytic cyst off the midpole which appears simple. Echogenicity within normal limits. No mass or hydronephrosis visualized.  Bladder:  Appears normal for degree of bladder distention.  IMPRESSION: Small bilateral simple appearing renal cysts.  No hydronephrosis.  No acute findings.  Electronically Signed   By: Rolm Baptise M.D.   On: 08/28/2017 09:32   Chest x-ray, 08/26/17: No cardiopulmonary disease   Lab Results  Component Value Date   WBC 7.5  08/28/2017   HGB 10.3 (L) 08/28/2017   HCT 33.1 (L) 08/28/2017   PLT 132 (L) 08/28/2017   GLUCOSE 129 (H) 08/28/2017   CHOL 104 (L) 09/18/2015   TRIG 97 09/18/2015   HDL 25 (L) 09/18/2015   LDLCALC 60 09/18/2015   ALT 17 08/22/2017   AST 17 08/22/2017   NA 132 (L) 08/28/2017   K 4.1 08/28/2017   CL 96 (L) 08/28/2017   CREATININE 3.13 (H) 08/28/2017   BUN 86 (H) 08/28/2017   CO2 23 08/28/2017   TSH 1.243 08/22/2017   INR 1.07 10/05/2014   HGBA1C 6.6 (H) 08/23/2017   MICROALBUR 43.4 (H) 03/04/2017     Assessment & Plan:    See Problem List for Assessment and Plan of chronic medical problems.

## 2017-09-06 NOTE — Patient Instructions (Addendum)
Let me know if your cough does not improve or worsens.   Medications reviewed and updated.   No changes recommended at this time.    Please followup in 6 months

## 2017-09-08 ENCOUNTER — Ambulatory Visit (INDEPENDENT_AMBULATORY_CARE_PROVIDER_SITE_OTHER): Payer: Medicare Other | Admitting: Internal Medicine

## 2017-09-08 ENCOUNTER — Encounter: Payer: Self-pay | Admitting: Internal Medicine

## 2017-09-08 VITALS — BP 116/58 | HR 78 | Temp 97.8°F | Resp 16 | Wt 204.0 lb

## 2017-09-08 DIAGNOSIS — E1122 Type 2 diabetes mellitus with diabetic chronic kidney disease: Secondary | ICD-10-CM | POA: Diagnosis not present

## 2017-09-08 DIAGNOSIS — R5383 Other fatigue: Secondary | ICD-10-CM | POA: Insufficient documentation

## 2017-09-08 DIAGNOSIS — E876 Hypokalemia: Secondary | ICD-10-CM | POA: Diagnosis not present

## 2017-09-08 DIAGNOSIS — N184 Chronic kidney disease, stage 4 (severe): Secondary | ICD-10-CM

## 2017-09-08 DIAGNOSIS — I5042 Chronic combined systolic (congestive) and diastolic (congestive) heart failure: Secondary | ICD-10-CM | POA: Diagnosis not present

## 2017-09-08 DIAGNOSIS — I472 Ventricular tachycardia, unspecified: Secondary | ICD-10-CM

## 2017-09-08 MED ORDER — AMLODIPINE BESYLATE 5 MG PO TABS: 5.0000 mg | ORAL_TABLET | Freq: Every day | ORAL | 1 refills | Status: DC

## 2017-09-08 MED ORDER — ATORVASTATIN CALCIUM 10 MG PO TABS: 10.0000 mg | ORAL_TABLET | Freq: Every day | ORAL | 1 refills | Status: DC

## 2017-09-08 NOTE — Assessment & Plan Note (Signed)
Diet controlled Recent a1c 6.6% No medication needed Fu in 6 months

## 2017-09-08 NOTE — Assessment & Plan Note (Signed)
S/p > 1 ICD shock Placed on amiodarone Followed up with cardiology already - amiodarone dose decreased

## 2017-09-08 NOTE — Assessment & Plan Note (Signed)
Stable at nephrology appointment

## 2017-09-08 NOTE — Assessment & Plan Note (Signed)
Has seen Dr Hollie Salk recently - potassium rechecked and per patient was 4.3 No need to recheck K today

## 2017-09-08 NOTE — Assessment & Plan Note (Signed)
He has had increased fatigue since leaving the hospital Discussed that is likely a result of being in the hospital and going through what he went through - the amiodarone may be contributing There has been some improvement in the fatigue

## 2017-09-08 NOTE — Assessment & Plan Note (Signed)
euvolemic on exam  continue torsemide daily, metolazone prn with extra lasix to prevent hypokalemia Continue current medications

## 2017-09-09 ENCOUNTER — Ambulatory Visit (INDEPENDENT_AMBULATORY_CARE_PROVIDER_SITE_OTHER): Payer: Medicare Other

## 2017-09-09 ENCOUNTER — Telehealth: Payer: Self-pay | Admitting: Physician Assistant

## 2017-09-09 DIAGNOSIS — I5042 Chronic combined systolic (congestive) and diastolic (congestive) heart failure: Secondary | ICD-10-CM

## 2017-09-09 DIAGNOSIS — Z95810 Presence of automatic (implantable) cardiac defibrillator: Secondary | ICD-10-CM

## 2017-09-09 DIAGNOSIS — E876 Hypokalemia: Secondary | ICD-10-CM | POA: Diagnosis not present

## 2017-09-09 DIAGNOSIS — I472 Ventricular tachycardia: Secondary | ICD-10-CM | POA: Diagnosis not present

## 2017-09-09 NOTE — Progress Notes (Signed)
EPIC Encounter for ICM Monitoring  Patient Name: Shane [name] is a 84 y.o. male Date: 09/09/2017 Primary Care Physican: [name], MD Primary Cardiologist:Nahser/Bensimhon Electrophysiologist: Caryl Comes Nephrologist: Hollie Salk at [facility] Weight: 206lbs  Bi-V Pacing:97.3%         Heart Failure questions reviewed, pt symptomatic with slight SOB today but is manageable.  He wants to wait on taking a Metolazone until HF clinic appointment on 2/8 but will take it if breathing gets worse. Hospitalization from 08/22/2017 to 08/28/2017 for device shock and dx of hypokalemia.   Thoracic impedance abnormal suggesting fluid accumulation since 08/12/2017 which correlates with the shock he received on 08/16/2017 followed by hospitalization on 08/22/2017.  Prescribed dosage: Torsemide 20 mg 5tablets (100 mg total)daily.  Metolazone 5 mg take 1 pill as directed by HF clinic if weight goes up 3 lbs overnight, or 5 lbs within one week, call before taking. Potassium 10 mEq take 1 capsule (10 mEq total) by mouth daily as needed (with each metolazone dose).  Labs: 08/28/2017 Creatinine 3.13, BUN 86, Potassium 4.1, Sodium 132, EGFR 17-20 08/27/2016 Creatinine 3.43, BUN 98, Potassium 4.3, Sodium 130, EGFR 15-18  08/26/2016 Creatinine 3.29, BUN 88, Potassium 4.2, Sodium 130, EGFR 16-19  08/25/2016 Creatinine 3.13, BUN 83, Potassium 3.6, Sodium 133, EGFR 17-20  08/24/2016 Creatinine 2.69, BUN 76, Potassium 3.5, Sodium 133, EGFR 21-24  08/23/2016 Creatinine 2.78, BUN 81, Potassium 3.0, Sodium 134, EGFR 20-23  08/22/2016 Creatinine 2.58, BUN 79, Potassium 3.3, Sodium 135, EGFR 22-25 @ 8:32 PM  08/22/2016 Creatinine 2.67, BUN 86, Potassium 2.6, Sodium 137, EGFR 21-24 @ 11:39 AM  06/12/2017 Creatinine 2.44, BUN 40, Potassium 3.6, Sodium 135, EGFR 23-27 06/05/2017 Creatinine 2.23, BUN 53, Potassium 3.7, Sodium 139, EGFR 26-30 05/13/2017 Creatinine 2.43, BUN 56, Potassium 3.8, Sodium 136, EGFR  23-27 05/06/2017 Creatinine 2.83, BUN 90, Potassium 3.7, Sodium 140, EGFR 19-23 Complete list of results can be viewed in results review.  Recommendations:  Encouraged to call for fluid symptoms.  Follow-up plan: ICM clinic phone appointment on 09/25/2017 to recheck fluid levels.  Office appointment scheduled 09/12/2017 with HF clinic NP/PA.  Copy of ICM check sent to Dr. Caryl Comes and Dr. Haroldine Laws.   3 month ICM trend: 09/09/2017    1 Year ICM trend:       Rosalene Billings, RN 09/09/2017 8:35 AM

## 2017-09-09 NOTE — Telephone Encounter (Signed)
Pt calling to let us know he was advised to take his Metolazone on Friday  He states he just took it and would like [place] to be aware   Please call if we have any questions

## 2017-09-09 NOTE — Telephone Encounter (Signed)
Spoke with patient.

## 2017-09-10 DIAGNOSIS — I130 Hypertensive heart and chronic kidney disease with heart failure and stage 1 through stage 4 chronic kidney disease, or unspecified chronic kidney disease: Secondary | ICD-10-CM | POA: Diagnosis not present

## 2017-09-10 DIAGNOSIS — E876 Hypokalemia: Secondary | ICD-10-CM | POA: Diagnosis not present

## 2017-09-10 DIAGNOSIS — Z95810 Presence of automatic (implantable) cardiac defibrillator: Secondary | ICD-10-CM

## 2017-09-10 DIAGNOSIS — N184 Chronic kidney disease, stage 4 (severe): Secondary | ICD-10-CM | POA: Diagnosis not present

## 2017-09-10 DIAGNOSIS — Z951 Presence of aortocoronary bypass graft: Secondary | ICD-10-CM

## 2017-09-10 DIAGNOSIS — N179 Acute kidney failure, unspecified: Secondary | ICD-10-CM | POA: Diagnosis not present

## 2017-09-10 DIAGNOSIS — J9601 Acute respiratory failure with hypoxia: Secondary | ICD-10-CM | POA: Diagnosis not present

## 2017-09-10 DIAGNOSIS — R55 Syncope and collapse: Secondary | ICD-10-CM | POA: Diagnosis not present

## 2017-09-10 DIAGNOSIS — N400 Enlarged prostate without lower urinary tract symptoms: Secondary | ICD-10-CM | POA: Diagnosis not present

## 2017-09-10 DIAGNOSIS — I2510 Atherosclerotic heart disease of native coronary artery without angina pectoris: Secondary | ICD-10-CM | POA: Diagnosis not present

## 2017-09-10 DIAGNOSIS — I472 Ventricular tachycardia: Secondary | ICD-10-CM | POA: Diagnosis not present

## 2017-09-10 DIAGNOSIS — I5042 Chronic combined systolic (congestive) and diastolic (congestive) heart failure: Secondary | ICD-10-CM | POA: Diagnosis not present

## 2017-09-10 DIAGNOSIS — E1140 Type 2 diabetes mellitus with diabetic neuropathy, unspecified: Secondary | ICD-10-CM | POA: Diagnosis not present

## 2017-09-10 DIAGNOSIS — E1122 Type 2 diabetes mellitus with diabetic chronic kidney disease: Secondary | ICD-10-CM | POA: Diagnosis not present

## 2017-09-10 DIAGNOSIS — Z7982 Long term (current) use of aspirin: Secondary | ICD-10-CM

## 2017-09-11 ENCOUNTER — Ambulatory Visit (INDEPENDENT_AMBULATORY_CARE_PROVIDER_SITE_OTHER): Payer: Medicare Other | Admitting: Podiatry

## 2017-09-11 ENCOUNTER — Other Ambulatory Visit: Payer: Self-pay

## 2017-09-11 DIAGNOSIS — E1151 Type 2 diabetes mellitus with diabetic peripheral angiopathy without gangrene: Secondary | ICD-10-CM | POA: Diagnosis not present

## 2017-09-11 DIAGNOSIS — B351 Tinea unguium: Secondary | ICD-10-CM

## 2017-09-12 ENCOUNTER — Ambulatory Visit (HOSPITAL_COMMUNITY)
Admission: RE | Admit: 2017-09-12 | Discharge: 2017-09-12 | Disposition: A | Discharge status: Home / Self Care | Payer: Medicare Other | Source: Ambulatory Visit | Attending: Cardiology | Admitting: Cardiology

## 2017-09-12 ENCOUNTER — Other Ambulatory Visit: Payer: Self-pay | Admitting: Internal Medicine

## 2017-09-12 VITALS — BP 158/66 | HR 68 | Wt 206.8 lb

## 2017-09-12 DIAGNOSIS — Z833 Family history of diabetes mellitus: Secondary | ICD-10-CM | POA: Diagnosis not present

## 2017-09-12 DIAGNOSIS — E785 Hyperlipidemia, unspecified: Secondary | ICD-10-CM | POA: Diagnosis not present

## 2017-09-12 DIAGNOSIS — Z8249 Family history of ischemic heart disease and other diseases of the circulatory system: Secondary | ICD-10-CM | POA: Diagnosis not present

## 2017-09-12 DIAGNOSIS — E876 Hypokalemia: Secondary | ICD-10-CM

## 2017-09-12 DIAGNOSIS — Z9862 Peripheral vascular angioplasty status: Secondary | ICD-10-CM | POA: Insufficient documentation

## 2017-09-12 DIAGNOSIS — Z801 Family history of malignant neoplasm of trachea, bronchus and lung: Secondary | ICD-10-CM | POA: Insufficient documentation

## 2017-09-12 DIAGNOSIS — I255 Ischemic cardiomyopathy: Secondary | ICD-10-CM

## 2017-09-12 DIAGNOSIS — Z86010 Personal history of colonic polyps: Secondary | ICD-10-CM | POA: Insufficient documentation

## 2017-09-12 DIAGNOSIS — I472 Ventricular tachycardia, unspecified: Secondary | ICD-10-CM

## 2017-09-12 DIAGNOSIS — I130 Hypertensive heart and chronic kidney disease with heart failure and stage 1 through stage 4 chronic kidney disease, or unspecified chronic kidney disease: Secondary | ICD-10-CM | POA: Diagnosis not present

## 2017-09-12 DIAGNOSIS — I252 Old myocardial infarction: Secondary | ICD-10-CM | POA: Insufficient documentation

## 2017-09-12 DIAGNOSIS — I10 Essential (primary) hypertension: Secondary | ICD-10-CM

## 2017-09-12 DIAGNOSIS — I25119 Atherosclerotic heart disease of native coronary artery with unspecified angina pectoris: Secondary | ICD-10-CM | POA: Diagnosis not present

## 2017-09-12 DIAGNOSIS — Z885 Allergy status to narcotic agent status: Secondary | ICD-10-CM | POA: Insufficient documentation

## 2017-09-12 DIAGNOSIS — E1122 Type 2 diabetes mellitus with diabetic chronic kidney disease: Secondary | ICD-10-CM | POA: Insufficient documentation

## 2017-09-12 DIAGNOSIS — Z95810 Presence of automatic (implantable) cardiac defibrillator: Secondary | ICD-10-CM | POA: Insufficient documentation

## 2017-09-12 DIAGNOSIS — H9190 Unspecified hearing loss, unspecified ear: Secondary | ICD-10-CM | POA: Diagnosis not present

## 2017-09-12 DIAGNOSIS — Z808 Family history of malignant neoplasm of other organs or systems: Secondary | ICD-10-CM | POA: Diagnosis not present

## 2017-09-12 DIAGNOSIS — I2510 Atherosclerotic heart disease of native coronary artery without angina pectoris: Secondary | ICD-10-CM | POA: Diagnosis not present

## 2017-09-12 DIAGNOSIS — H409 Unspecified glaucoma: Secondary | ICD-10-CM | POA: Insufficient documentation

## 2017-09-12 DIAGNOSIS — Z7982 Long term (current) use of aspirin: Secondary | ICD-10-CM | POA: Diagnosis not present

## 2017-09-12 DIAGNOSIS — Z8371 Family history of colonic polyps: Secondary | ICD-10-CM | POA: Insufficient documentation

## 2017-09-12 DIAGNOSIS — Z96651 Presence of right artificial knee joint: Secondary | ICD-10-CM | POA: Insufficient documentation

## 2017-09-12 DIAGNOSIS — I5022 Chronic systolic (congestive) heart failure: Secondary | ICD-10-CM | POA: Insufficient documentation

## 2017-09-12 DIAGNOSIS — E7800 Pure hypercholesterolemia, unspecified: Secondary | ICD-10-CM | POA: Insufficient documentation

## 2017-09-12 DIAGNOSIS — E1140 Type 2 diabetes mellitus with diabetic neuropathy, unspecified: Secondary | ICD-10-CM | POA: Insufficient documentation

## 2017-09-12 DIAGNOSIS — I5042 Chronic combined systolic (congestive) and diastolic (congestive) heart failure: Secondary | ICD-10-CM

## 2017-09-12 DIAGNOSIS — N184 Chronic kidney disease, stage 4 (severe): Secondary | ICD-10-CM | POA: Diagnosis not present

## 2017-09-12 DIAGNOSIS — M1990 Unspecified osteoarthritis, unspecified site: Secondary | ICD-10-CM | POA: Insufficient documentation

## 2017-09-12 DIAGNOSIS — Z79899 Other long term (current) drug therapy: Secondary | ICD-10-CM | POA: Diagnosis not present

## 2017-09-12 DIAGNOSIS — Z951 Presence of aortocoronary bypass graft: Secondary | ICD-10-CM | POA: Insufficient documentation

## 2017-09-12 DIAGNOSIS — Z87891 Personal history of nicotine dependence: Secondary | ICD-10-CM | POA: Insufficient documentation

## 2017-09-12 DIAGNOSIS — G4733 Obstructive sleep apnea (adult) (pediatric): Secondary | ICD-10-CM

## 2017-09-12 DIAGNOSIS — N400 Enlarged prostate without lower urinary tract symptoms: Secondary | ICD-10-CM | POA: Insufficient documentation

## 2017-09-12 DIAGNOSIS — Z881 Allergy status to other antibiotic agents status: Secondary | ICD-10-CM | POA: Insufficient documentation

## 2017-09-12 DIAGNOSIS — Z888 Allergy status to other drugs, medicaments and biological substances status: Secondary | ICD-10-CM | POA: Insufficient documentation

## 2017-09-12 LAB — BASIC METABOLIC PANEL
Anion gap: 13 (ref 5–15)
BUN: 69 mg/dL — AB (ref 6–20)
CHLORIDE: 96 mmol/L — AB (ref 101–111)
CO2: 27 mmol/L (ref 22–32)
CREATININE: 2.91 mg/dL — AB (ref 0.61–1.24)
Calcium: 8.7 mg/dL — ABNORMAL LOW (ref 8.9–10.3)
GFR calc Af Amer: 22 mL/min — ABNORMAL LOW (ref >60)
GFR calc non Af Amer: 19 mL/min — ABNORMAL LOW (ref >60)
Glucose, Bld: 152 mg/dL — ABNORMAL HIGH (ref 65–99)
POTASSIUM: 3.6 mmol/L (ref 3.5–5.1)
SODIUM: 136 mmol/L (ref 135–145)

## 2017-09-12 LAB — MAGNESIUM: MAGNESIUM: 2.2 mg/dL (ref 1.7–2.4)

## 2017-09-12 NOTE — Progress Notes (Signed)
Patient ID: Shane [name], male   DOB: [birth year], 84 y.o.   MRN: [id number]    Advanced Heart Failure Clinic Note   Date:  09/12/2017   ID:  Shane [name], DOB Oct 05, 1933, MRN [id number]  PCP:  [name], MD  Cardiologist:   Nahser  Nephrologist: Hollie Salk  History of Present Illness: Shane [name] is a 84 y.o. male with CAD s/p CABG, systolic HF EF 51-76% and CKD 4 referred to the HF Clinic by Dr. Acie Fredrickson.   He is retired from [employer]. Had been doing fine until March or April 2018 when he started having more DOE. Felt he couldn't breath as deeply. MDT ICD was optimized. Took him off Toprol and switched to carvedilol. Also started Entresto. Began to gain fluid. Then lasix was started. (this was new). Entresto stopped. Creatine went from 1.9 to 3.3 then back to to 2.7.   Admitted 05/01/17-05/06/17 with cardiorenal syndrome. Discussed with Dr. Hollie Salk (His nephrologist) who agreed on admission for IV lasix trial. (He does have a patent fistula in place for initiation of HD once required). Pt initially had poor response to IV lasix and Renal consulted with plans for HD initiation. However on day 2, he began to respond to high-dose lasix with increased urine output and decreasing BUN/Cr. BP active meds cut back to allow more room for renal perfusion. Overall he diuresed 9.6 L and down 12 lbs on lasix 120 mg IV BID. Pt examined am of 05/06/17. Discharge weight was 206 pounds.   Admitted 1/18 -> 08/28/17 with ICD shock. Found to have VT in setting of hypokalemia. Diuresis complicated by CKD stage IV. Treated with IV amiodarone for VT, and transitioned to po for home.   He presents today for post hospital follow up.  Fluid has been trending up by Optivol. Took metolazone twice this week. He has been feeling well since post hospital visit.  Has seen EP and renal since leaving hospital, and both made no changes. He is supposed to continue amiodarone BID for at least the next several weeks.  Taking all medications as directed. Weight at home down to 196 from 200 earlier this week.  Diuresis has been limited by renal function.   Optivol: Fluid status elevated over threshold for past several weeks. Pt activity down from 3-4 hrs, down to 2 hrs.   Past Medical History:  Diagnosis Date  . Abnormal nuclear cardiac imaging test Nov 2010   moderate area of infarct in the inferior wall with only minimal reversibility and EF of 28%  . AICD (automatic cardioverter/defibrillator) present   . Allergic rhinitis 01-08-13   Uses nebulizer for chronic sinus issues and Mucinex.  . Arthritis    osteoarthritis   . Asthma    Extrinic  . Blood transfusion    ? at time of bypass surgery   . BPH (benign prostatic hyperplasia)   . Cellulitis of arm, left    MSSA  . CHF (congestive heart failure) ([place])   . CKD (chronic kidney disease), stage III ([place])    "was told due to meds he takes"-no Renal workup done  . Colon polyps    adenomatous  . Coronary artery disease    remote CABG in 1985, cath in 2003 by Dr. Lia Foyer with no PCI, last nuclear in 2010 showing scar and EF of 28%.   . Diabetes mellitus   . Diabetic neuropathy ([place]) 04/25/2017  . Dyslipidemia   . Dyspnea   . Gait abnormality 04/25/2017  .  Glaucoma 01-08-13   tx. eye drops  . HOH (hard of hearing)   . HTN (hypertension)   . Hypercholesterolemia   . Left ventricular dysfunction    28% per nuclear in 2010 and 35 to 40% per echo in 2010  . Myocardial infarction ([place])    1985  . Neuromuscular disorder ([place])    legs mild paralysis-able to walk"nerve damage"-legs- left leg brace   . Neuropathy   . Pneumonia    hx of several times years ago   . Spinal stenosis     Past Surgical History:  Procedure Laterality Date  . A/V FISTULAGRAM Right 10/17/2016   Procedure: A/V Fistulagram;  Surgeon: Conrad Kiefer, MD;  Location: [facility];  Service: Cardiovascular;  Laterality: Right;  . BACK SURGERY     hx of back surgery x 4   .  BASCILIC VEIN TRANSPOSITION Right 09/09/2016   Procedure: BASCILIC VEIN TRANSPOSITION Right Arm;  Surgeon: Rosetta Posner, MD;  Location[facility];  Service: Vascular;  Laterality: Right;  . BI-VENTRICULAR IMPLANTABLE CARDIOVERTER DEFIBRILLATOR N/A 10/10/2014   Procedure: BI-VENTRICULAR IMPLANTABLE CARDIOVERTER DEFIBRILLATOR  (CRT-D);  Surgeon: [name], MD;  Location: [facility] CATH LAB;  Service: Cardiovascular;  Laterality: N/A;  . CARDIAC CATHETERIZATION  2003  . Cataract      recent cataract surgery 6'14  . COLONOSCOPY N/A 01/25/2013   Procedure: COLONOSCOPY;  Surgeon: Irene Shipper, MD;  Location: [facility];  Service: Endoscopy;  Laterality: N/A;  . CORONARY ARTERY BYPASS GRAFT  1985   x 5 vessels  . LUMBAR DISC SURGERY    . PERIPHERAL VASCULAR BALLOON ANGIOPLASTY Right 10/17/2016   Procedure: Peripheral Vascular Balloon Angioplasty;  Surgeon: Conrad Hickam Housing, MD;  Location: [facility];  Service: Cardiovascular;  Laterality: Right;  ARM VEINOUS AND CENTRAL VEIN  . PR POLYSOM 6/>YRS SLEEP 4/> ADDL PARAM ATTND  12/07/2015  . PROSTATE SURGERY    . TOTAL KNEE ARTHROPLASTY  08/01/2011   Procedure: TOTAL KNEE ARTHROPLASTY;  Surgeon: Johnn Hai;  Location: [facility] ORS;  Service: Orthopedics;  Laterality: Right;    Current Outpatient Medications  Medication Sig Dispense Refill  . acetaminophen (TYLENOL) 500 MG tablet Take 1,000 mg by mouth every 8 (eight) hours as needed (pain).     Marland Kitchen albuterol (PROVENTIL HFA;VENTOLIN HFA) 108 (90 Base) MCG/ACT inhaler Inhale 2 puffs into the lungs every 4 (four) hours as needed for wheezing or shortness of breath (cough, shortness of breath or wheezing.). 1 Inhaler 1  . amiodarone (PACERONE) 200 MG tablet Take 1 tablet (200 mg total) by mouth 2 (two) times daily. 60 tablet 0  . amLODipine (NORVASC) 5 MG tablet Take 1 tablet (5 mg total) by mouth daily. 30 tablet 1  . aspirin EC 81 MG tablet Take 81 mg by mouth daily.     Marland Kitchen atorvastatin (LIPITOR) 10 MG tablet Take  1 tablet (10 mg total) by mouth daily. 90 tablet 1  . carvedilol (COREG) 6.25 MG tablet Take 1 tablet (6.25 mg total) by mouth 2 (two) times daily with a meal. 60 tablet 5  . cetirizine (ZYRTEC) 10 MG tablet Take 10 mg by mouth at bedtime as needed (seasonal allergies).     Marland Kitchen dextromethorphan-guaiFENesin (MUCINEX DM) 30-600 MG 12hr tablet Take 1 tablet by mouth 2 (two) times daily as needed for cough. 20 tablet 0  . febuxostat (ULORIC) 40 MG tablet Take 1 tablet (40 mg total) by mouth daily. 90 tablet 3  .  fluocinonide cream (LIDEX) 4.58 % Apply 1 application topically 2 (two) times daily as needed (for itchy rash).     . gabapentin (NEURONTIN) 300 MG capsule Take 1 capsule (300 mg total) by mouth at bedtime. 30 capsule 1  . hydrALAZINE (APRESOLINE) 50 MG tablet Take 1 tablet (50 mg total) 3 (three) times daily by mouth. 90 tablet 6  . metolazone (ZAROXOLYN) 5 MG tablet Take 1 pill as directed by HF clinic if weight goes up 3 lbs overnight, or 5 lbs within one week. Call before taking. 15 tablet 3  . polyethylene glycol (MIRALAX / GLYCOLAX) packet Take 17 g by mouth daily as needed. 14 each 0  . potassium chloride (K-DUR,KLOR-CON) 10 MEQ tablet Take 20 mEq by mouth as needed (with every metolazone tab).    . tadalafil (CIALIS) 20 MG tablet Take 20 mg by mouth daily as needed for erectile dysfunction. Do not exceed 2 doses in 7 days    . testosterone cypionate (DEPOTESTOTERONE CYPIONATE) 100 MG/ML injection Inject 400 mg into the muscle every 21 ( twenty-one) days. For IM use only    . torsemide (DEMADEX) 20 MG tablet Take 5 tablets (100 mg total) by mouth daily. 150 tablet 6  . triamcinolone (NASACORT AQ) 55 MCG/ACT AERO nasal inhaler Place 2 sprays into the nose daily as needed (seasonal allergies).     . zolpidem (AMBIEN) 10 MG tablet TAKE 1/2 TO 1 (ONE-HALF TO ONE) TABLET BY MOUTH AT BEDTIME AS NEEDED FOR SLEEP 30 tablet 1   Current Facility-Administered Medications  Medication Dose Route  Frequency Provider Last Rate Last Dose  . omalizumab Arvid Right) injection 300 mg  300 mg Subcutaneous Q14 Days Parrett, Tammy S, NP   300 mg at 09/04/17 1613   Allergies:   Codeine; Hydrocodone; Pseudoephedrine; and Azithromycin   Social History:  The patient  reports that he quit smoking about 59 years ago. His smoking use included cigarettes, pipe, and cigars. He has a 5.00 pack-year smoking history. he has never used smokeless tobacco. He reports that he does not drink alcohol or use drugs.   Family History:  The patient's family history includes Bone cancer in his brother; Colon polyps in his father; Diabetes in his sister; Heart attack in his father; Heart disease in his brother and father; Lung cancer in his mother.  Review of systems complete and found to be negative unless listed in HPI.     PHYSICAL EXAM: Vitals:   09/12/17 1224  BP: (!) 158/66  Pulse: 68  SpO2: 96%  Weight: 206 lb 12.8 oz (93.8 kg)     Wt Readings from Last 3 Encounters:  09/12/17 206 lb 12.8 oz (93.8 kg)  09/08/17 204 lb (92.5 kg)  09/03/17 210 lb (95.3 kg)    Physical Exam General: Well appearing. No resp difficulty. HEENT: Normal Neck: Supple. JVP 11-12 cm Carotids 2+ bilat; no bruits. No thyromegaly or nodule noted. Cor: PMI nondisplaced. RRR, 2/6 murmur (? If its just his AVF) Lungs: CTAB, normal effort. Abdomen: Soft, non-tender, non-distended, no HSM. No bruits or masses. +BS  Extremities: No cyanosis, clubbing, or rash. 1-2+ peripheral edema L>R. RUE AVF.  Neuro: Alert & orientedx3, cranial nerves grossly intact. moves all 4 extremities w/o difficulty. Affect pleasant    ASSESSMENT AND PLAN: 1. Chronic systolic congestive heart failure: ICM. Echo EF 40-45% in 11/2015.  - NYHA II-III depending on volume status - Volume status elevated but has taken metolazone twice already this week. Trying to limit use  with CKD IV.  - Continue torsemide 100 mg daily.  - Continue Coreg 6.25 mg BID -  Reinforced fluid restriction to < 2 L daily, sodium restriction to less than 2000 mg daily, and the importance of daily weights.    2. Coronary artery disease: history of remote coronary artery bypass grafting 1985 - No s/s of ischemia.    - Last cath 2003, LM and RCA occluded. LIMA to LAD patent. RIMA to OM patent. SVG to PDA occluded.   3. CKD 4 - Baseline creatinine now 2.8 - 3.0 - Follows with Dr. Hollie Salk. Have previously discussed, pt nearing dialysis.   4. Hyperlipidemia - Continue atorvastatin. No change.   5. Diabetes mellitus - Per PCP.   6. HTN - Meds as above.   7. VT - Continue amiodarone. Needs to reschedule EP follow up.   BMET and Mg today. He remains at least moderately volume overloaded on exam but kidney function has limited diuresis.  OK to take metolazone up to twice a week. Will recheck BMET/Mg x 2 weeks per patient request with recent VT and hypokalemia.  Pt has a very narrow euvolemic window with SOB when > 200 lbs at home, and AKI when he gets into lower 190s.   Pt knows to call our office with worsening symptoms. He is doing his best to avoid dialysis.   Shirley Friar, PA-C  12:28 PM   Greater than 50% of the 25 minute visit was spent in counseling/coordination of care regarding disease state education, salt/fluid restriction, sliding scale diuretics, and medication compliance.

## 2017-09-12 NOTE — Patient Instructions (Addendum)
Labs today We will only contact you if something comes back abnormal or we need to make some changes. Otherwise no news is good news!  Labs needed again in 2 weeks   Your physician recommends that you schedule a follow-up appointment in: [place]    Do the following things EVERYDAY: 1) Weigh yourself in the morning before breakfast. Write it down and keep it in a log. 2) Take your medicines as prescribed 3) Eat low salt foods-Limit salt (sodium) to 2000 mg per day.  4) Stay as active as you can everyday 5) Limit all fluids for the day to less than 2 liters

## 2017-09-14 ENCOUNTER — Other Ambulatory Visit: Payer: Self-pay | Admitting: Internal Medicine

## 2017-09-14 DIAGNOSIS — F5101 Primary insomnia: Secondary | ICD-10-CM

## 2017-09-15 DIAGNOSIS — I472 Ventricular tachycardia: Secondary | ICD-10-CM | POA: Diagnosis not present

## 2017-09-15 DIAGNOSIS — E876 Hypokalemia: Secondary | ICD-10-CM | POA: Diagnosis not present

## 2017-09-15 NOTE — Telephone Encounter (Signed)
[place] Controlled Substance Database checked. Last filled on 08/08/17

## 2017-09-17 DIAGNOSIS — E876 Hypokalemia: Secondary | ICD-10-CM | POA: Diagnosis not present

## 2017-09-17 DIAGNOSIS — I472 Ventricular tachycardia: Secondary | ICD-10-CM | POA: Diagnosis not present

## 2017-09-18 ENCOUNTER — Ambulatory Visit: Payer: Medicare Other

## 2017-09-18 ENCOUNTER — Telehealth: Payer: Self-pay | Admitting: Pulmonary Disease

## 2017-09-18 DIAGNOSIS — J4540 Moderate persistent asthma, uncomplicated: Secondary | ICD-10-CM

## 2017-09-18 NOTE — Telephone Encounter (Signed)
#   vials:4 Ordered date:09/18/17 Shipping Date:09/18/17

## 2017-09-19 DIAGNOSIS — E876 Hypokalemia: Secondary | ICD-10-CM | POA: Diagnosis not present

## 2017-09-19 DIAGNOSIS — I472 Ventricular tachycardia: Secondary | ICD-10-CM | POA: Diagnosis not present

## 2017-09-19 MED ORDER — OMALIZUMAB 150 MG ~~LOC~~ SOLR
300.0000 mg | SUBCUTANEOUS | Status: DC
Start: 2017-09-18 — End: 2017-10-02
  Administered 2017-09-18: 300 mg via SUBCUTANEOUS

## 2017-09-19 NOTE — Progress Notes (Signed)
Documentation of medication administration and charges of Xolair have been completed by  , CMA based on the Xolair documentation sheet completed by Tammy Scott.  

## 2017-09-19 NOTE — Telephone Encounter (Signed)
#   Vials:4 Arrival Date:09/19/17 Lot #:[id number]  Exp Date:07/2018

## 2017-09-22 DIAGNOSIS — E876 Hypokalemia: Secondary | ICD-10-CM | POA: Diagnosis not present

## 2017-09-22 DIAGNOSIS — I472 Ventricular tachycardia: Secondary | ICD-10-CM | POA: Diagnosis not present

## 2017-09-22 NOTE — Progress Notes (Signed)
  Subjective:  Patient ID: Shane [name], male    DOB: 10-25-1933,  MRN: [id number]  No chief complaint on file.  84 y.o. male returns for diabetic foot care.  Reports elongated nails.  Did not check sugars.  No new issues.  Objective:  PAD General AA&O x3. Normal mood and affect.  Vascular Dorsalis pedis pulses present 1+ bilaterally  Posterior tibial pulses absent bilaterally  Capillary refill normal to all digits. Pedal hair growth normal.  Neurologic Epicritic sensation present bilaterally. Protective sensation with 5.07 monofilament  present bilaterally. Vibratory sensation present bilaterally.  Dermatologic No open lesions. Interspaces clear of maceration.  Normal skin temperature and turgor. Hyperkeratotic lesions: None bilaterally. Nails: brittle, onychomycosis, thickening, elongation  Orthopedic: No history of amputation. MMT 5/5 in dorsiflexion, plantarflexion, inversion, and eversion. Normal lower extremity joint ROM without pain or crepitus.   Assessment & Plan:  Patient was evaluated and treated and all questions answered.  Diabetes with PAD, Onychomycosis -Educated on diabetic footcare. Diabetic risk level 1 -At risk foot care provided as below.  Procedure: Nail Debridement Rationale: Patient meets criteria for routine foot care due to class B findings Type of Debridement: manual, sharp debridement. Instrumentation: Nail nipper, rotary burr. Number of Nails: 10  No Follow-up on file.

## 2017-09-24 DIAGNOSIS — I472 Ventricular tachycardia: Secondary | ICD-10-CM | POA: Diagnosis not present

## 2017-09-24 DIAGNOSIS — E876 Hypokalemia: Secondary | ICD-10-CM | POA: Diagnosis not present

## 2017-09-25 ENCOUNTER — Ambulatory Visit (INDEPENDENT_AMBULATORY_CARE_PROVIDER_SITE_OTHER): Payer: Self-pay

## 2017-09-25 DIAGNOSIS — Z95810 Presence of automatic (implantable) cardiac defibrillator: Secondary | ICD-10-CM

## 2017-09-25 DIAGNOSIS — I5042 Chronic combined systolic (congestive) and diastolic (congestive) heart failure: Secondary | ICD-10-CM

## 2017-09-26 ENCOUNTER — Ambulatory Visit (HOSPITAL_COMMUNITY)
Admission: RE | Admit: 2017-09-26 | Discharge: 2017-09-26 | Disposition: A | Discharge status: Home / Self Care | Payer: Medicare Other | Source: Ambulatory Visit | Attending: Cardiology | Admitting: Cardiology

## 2017-09-26 DIAGNOSIS — I255 Ischemic cardiomyopathy: Secondary | ICD-10-CM

## 2017-09-26 LAB — BASIC METABOLIC PANEL
Anion gap: 13 (ref 5–15)
BUN: 84 mg/dL — ABNORMAL HIGH (ref 6–20)
CALCIUM: 8.6 mg/dL — AB (ref 8.9–10.3)
CHLORIDE: 97 mmol/L — AB (ref 101–111)
CO2: 27 mmol/L (ref 22–32)
CREATININE: 3.2 mg/dL — AB (ref 0.61–1.24)
GFR calc Af Amer: 19 mL/min — ABNORMAL LOW (ref >60)
GFR calc non Af Amer: 17 mL/min — ABNORMAL LOW (ref >60)
GLUCOSE: 140 mg/dL — AB (ref 65–99)
Potassium: 3.6 mmol/L (ref 3.5–5.1)
Sodium: 137 mmol/L (ref 135–145)

## 2017-09-26 LAB — MAGNESIUM: Magnesium: 2.2 mg/dL (ref 1.7–2.4)

## 2017-09-26 NOTE — Progress Notes (Signed)
EPIC Encounter for ICM Monitoring  Patient Name: Shane [name] is a 84 y.o. male Date: 09/26/2017 Primary Care Physican: [name], MD Primary Cardiologist:Nahser/Bensimhon Electrophysiologist: Caryl Comes Nephrologist: Hollie Salk at [facility] Weight:203lbs  Bi-V Pacing:97.4%       Heart Failure questions reviewed, pt Shane [name] of breath yesterday and took Metolazone yesterday and today.  Breathing has improved today.    Thoracic impedance abnormal suggesting fluid accumulation 09/17/2017.  Impedance was at baseline between 09/13/2017 and 09/15/2017 after taking Metolazone twice the week before.    Prescribed dosage: Torsemide 20 mg 5tablets (100 mg total)daily.  Per office note 09/12/2017 with HF clinic-OK to take metolazone up to twice a week. Metolazone 5 mg take 1 pill as directed by HF clinic if weight goes up 3 lbs overnight, or 5 lbs within one week, call before taking.   Potassium 10 mEq take 20 mEq by mouth daily as needed (with each metolazone dose).  Labs: 09/12/2017 Creatinine 2.91, BUN 69, Potassium 3.6, Sodium 136, EGFR 19-22 08/28/2017 Creatinine 3.13, BUN 86, Potassium 4.1, Sodium 132, EGFR 17-20 08/27/2016 Creatinine 3.43, BUN 98, Potassium 4.3, Sodium 130, EGFR 15-18  08/26/2016 Creatinine 3.29, BUN 88, Potassium 4.2, Sodium 130, EGFR 16-19  08/25/2016 Creatinine 3.13, BUN 83, Potassium 3.6, Sodium 133, EGFR 17-20  08/24/2016 Creatinine 2.69, BUN 76, Potassium 3.5, Sodium 133, EGFR 21-24  08/23/2016 Creatinine 2.78, BUN 81, Potassium 3.0, Sodium 134, EGFR 20-23  08/22/2016 Creatinine 2.58, BUN 79, Potassium 3.3, Sodium 135, EGFR 22-25 @ 8:32 PM  08/22/2016 Creatinine 2.67, BUN 86, Potassium 2.6, Sodium 137, EGFR 21-24 @ 11:39 AM  06/12/2017 Creatinine 2.44, BUN 40, Potassium 3.6, Sodium 135, EGFR 23-27 06/05/2017 Creatinine 2.23, BUN 53, Potassium 3.7, Sodium 139, EGFR 26-30 05/13/2017 Creatinine 2.43, BUN 56, Potassium 3.8, Sodium 136, EGFR 23-27 05/06/2017  Creatinine 2.83, BUN 90, Potassium 3.7, Sodium 140, EGFR 19-23 Complete list of results can be viewed in results review.  Recommendations: No changes.    Follow-up plan: ICM clinic phone appointment on 10/13/2017.  Office appointment scheduled 10/15/2017 with Dr. Acie Fredrickson; 10/27/2017 with Dr Haroldine Laws and 10/30/2017 with Dr Caryl Comes.  Copy of ICM check sent to Dr. Merri Ray and Dr. Caryl Comes.   3 month ICM trend: 09/25/2017    1 Year ICM trend:       Rosalene Billings, RN 09/26/2017 12:30 PM

## 2017-09-27 ENCOUNTER — Other Ambulatory Visit: Payer: Self-pay | Admitting: Internal Medicine

## 2017-09-29 DIAGNOSIS — E876 Hypokalemia: Secondary | ICD-10-CM | POA: Diagnosis not present

## 2017-09-29 DIAGNOSIS — I472 Ventricular tachycardia: Secondary | ICD-10-CM | POA: Diagnosis not present

## 2017-09-30 DIAGNOSIS — E291 Testicular hypofunction: Secondary | ICD-10-CM | POA: Diagnosis not present

## 2017-10-01 DIAGNOSIS — I5022 Chronic systolic (congestive) heart failure: Secondary | ICD-10-CM | POA: Diagnosis not present

## 2017-10-01 DIAGNOSIS — N184 Chronic kidney disease, stage 4 (severe): Secondary | ICD-10-CM | POA: Diagnosis not present

## 2017-10-01 DIAGNOSIS — E876 Hypokalemia: Secondary | ICD-10-CM | POA: Diagnosis not present

## 2017-10-01 DIAGNOSIS — I129 Hypertensive chronic kidney disease with stage 1 through stage 4 chronic kidney disease, or unspecified chronic kidney disease: Secondary | ICD-10-CM | POA: Diagnosis not present

## 2017-10-02 ENCOUNTER — Ambulatory Visit (INDEPENDENT_AMBULATORY_CARE_PROVIDER_SITE_OTHER): Payer: Medicare Other

## 2017-10-02 DIAGNOSIS — J4540 Moderate persistent asthma, uncomplicated: Secondary | ICD-10-CM | POA: Diagnosis not present

## 2017-10-02 MED ORDER — OMALIZUMAB 150 MG ~~LOC~~ SOLR
300.0000 mg | Freq: Once | SUBCUTANEOUS | Status: AC
  Administered 2017-10-02: 300 mg via SUBCUTANEOUS

## 2017-10-02 NOTE — Progress Notes (Signed)
xolair

## 2017-10-03 ENCOUNTER — Other Ambulatory Visit: Payer: Self-pay | Admitting: *Deleted

## 2017-10-03 MED ORDER — AMIODARONE HCL 200 MG PO TABS: 200.0000 mg | ORAL_TABLET | Freq: Every day | ORAL | 3 refills | Status: DC

## 2017-10-03 NOTE — Telephone Encounter (Signed)
Pharmacy requesting amiodarone refill for patient from Dr Caryl Comes. I do not see where he has ever filled this for the patient. Also they are requesting 200 mg bid but at patients last office visit here patient was instructed to start taking 200 mg qd starting 09/23/17. Okay to refill? Please advise. Thanks, MI

## 2017-10-06 DIAGNOSIS — M1712 Unilateral primary osteoarthritis, left knee: Secondary | ICD-10-CM | POA: Diagnosis not present

## 2017-10-06 DIAGNOSIS — Z96651 Presence of right artificial knee joint: Secondary | ICD-10-CM | POA: Diagnosis not present

## 2017-10-13 ENCOUNTER — Telehealth: Payer: Self-pay | Admitting: Internal Medicine

## 2017-10-13 ENCOUNTER — Ambulatory Visit (INDEPENDENT_AMBULATORY_CARE_PROVIDER_SITE_OTHER): Payer: Medicare Other

## 2017-10-13 ENCOUNTER — Telehealth: Payer: Self-pay | Admitting: Cardiology

## 2017-10-13 DIAGNOSIS — N184 Chronic kidney disease, stage 4 (severe): Secondary | ICD-10-CM | POA: Diagnosis not present

## 2017-10-13 DIAGNOSIS — I5042 Chronic combined systolic (congestive) and diastolic (congestive) heart failure: Secondary | ICD-10-CM | POA: Diagnosis not present

## 2017-10-13 DIAGNOSIS — Z95810 Presence of automatic (implantable) cardiac defibrillator: Secondary | ICD-10-CM

## 2017-10-13 NOTE — Telephone Encounter (Signed)
LVM with [name] to give verbal orders for PT per MD

## 2017-10-13 NOTE — Telephone Encounter (Signed)
Spoke with pt and reminded pt of remote transmission that is due today. Pt verbalized understanding.   

## 2017-10-13 NOTE — Telephone Encounter (Signed)
Copied from [place] [id number]. Topic: General - Other >> Oct 13, 2017  1:58 PM Oneta Rack wrote: Caller name:[name] Relation to pt: PT from [facility]  Call back number: [phone]    Reason for call:  Requesting verbal orders for  1x 1 to discharge patient this week. >> Oct 13, 2017  2:01 PM Oneta Rack wrote: Osvaldo Human name:[name] Relation to pt: PT from [facility]  Call back number: [phone]    Reason for call:  Requesting verbal orders for  1x 1 to discharge patient this week.

## 2017-10-14 ENCOUNTER — Other Ambulatory Visit: Payer: Self-pay | Admitting: Internal Medicine

## 2017-10-14 ENCOUNTER — Telehealth: Payer: Self-pay

## 2017-10-14 NOTE — Telephone Encounter (Signed)
Remote ICM transmission received.  Attempted call to patient and left detailed message per DPR regarding transmission and next ICM scheduled for 11/27/2017.  Advised to return call for any fluid symptoms or questions.

## 2017-10-14 NOTE — Progress Notes (Signed)
EPIC Encounter for ICM Monitoring  Patient Name: Shane [name] is a 84 y.o. male Date: 10/14/2017 Primary Care Physican: [name], MD Primary Cardiologist:Nahser/Bensimhon Electrophysiologist: Caryl Comes Nephrologist: Hollie Salk at [facility] weight 203lbs  Bi-V Pacing:95.3%          Attempted call to patient and unable to reach.  Left detailed message regarding transmission.  Transmission reviewed.    Thoracic impedance trending toward baseline normal.  Prescribed dosage: Torsemide 20 mg 5tablets (100 mg total)daily.  Per office note 09/12/2017 with HF clinic-OK to take metolazone up to twice a week. Metolazone 5 mg take 1 pill as directed by HF clinic if weight goes up 3 lbs overnight, or 5 lbs within one week, call before taking.   Potassium 10 mEq take 20 mEq by mouth daily as needed (with each metolazone dose).  Labs: 09/12/2017 Creatinine 2.91, BUN 69, Potassium 3.6, Sodium 136, EGFR 19-22 08/28/2017 Creatinine3.13, BUN86, Potassium4.1, Sodium132, [id number] 08/27/2016 Creatinine3.43, BUN98, Potassium4.3, Sodium130, [id number]  08/26/2016 Creatinine3.29, BUN88, Potassium4.2, Sodium130, [id number]  08/25/2016 Creatinine3.13, BUN83, Potassium3.6, Sodium133, [id number]  08/24/2016 Creatinine2.69, BUN76, Potassium3.5, [id number], [id number]  08/23/2016 Creatinine2.78, BUN81, Potassium3.0, [id number], [id number]  08/22/2016 Creatinine2.58, BUN79, Potassium3.3, [id number], PGFQ42-10 @ 8:32 PM  08/22/2016 Creatinine2.67, ZXY81, Potassium2.6, [id number], [id number] @ 11:39 AM 06/12/2017 Creatinine 2.44, BUN 40, Potassium 3.6, Sodium 135, EGFR 23-27 06/05/2017 Creatinine 2.23, BUN 53, Potassium 3.7, Sodium 139, EGFR 26-30 05/13/2017 Creatinine 2.43, BUN 56, Potassium 3.8, Sodium 136, EGFR 23-27 05/06/2017 Creatinine 2.83, BUN 90, Potassium 3.7, Sodium 140, EGFR 19-23 Complete list of results can be viewed in results  review.  Recommendations: Left voice mail with ICM number and encouraged to call if experiencing any fluid symptoms.  Follow-up plan: ICM clinic phone appointment on 11/27/2017.  Office appointment scheduled 10/15/2017 with Dr. Acie Fredrickson; 10/27/2017 with Dr Haroldine Laws and 10/27/2017 with Dr Caryl Comes.  Copy of ICM check sent to Dr. Caryl Comes.   3 month ICM trend: 10/14/2017    1 Year ICM trend:       Rosalene Billings, RN 10/14/2017 10:54 AM

## 2017-10-15 ENCOUNTER — Ambulatory Visit: Payer: Medicare Other | Admitting: Cardiovascular Disease

## 2017-10-15 DIAGNOSIS — E876 Hypokalemia: Secondary | ICD-10-CM | POA: Diagnosis not present

## 2017-10-15 DIAGNOSIS — I472 Ventricular tachycardia: Secondary | ICD-10-CM | POA: Diagnosis not present

## 2017-10-16 ENCOUNTER — Ambulatory Visit (INDEPENDENT_AMBULATORY_CARE_PROVIDER_SITE_OTHER): Payer: Medicare Other

## 2017-10-16 DIAGNOSIS — J4540 Moderate persistent asthma, uncomplicated: Secondary | ICD-10-CM | POA: Diagnosis not present

## 2017-10-17 MED ORDER — OMALIZUMAB 150 MG ~~LOC~~ SOLR
300.0000 mg | Freq: Once | SUBCUTANEOUS | Status: AC
  Administered 2017-10-16: 300 mg via SUBCUTANEOUS

## 2017-10-20 ENCOUNTER — Ambulatory Visit (INDEPENDENT_AMBULATORY_CARE_PROVIDER_SITE_OTHER): Payer: Self-pay

## 2017-10-20 ENCOUNTER — Telehealth: Payer: Self-pay

## 2017-10-20 DIAGNOSIS — I5042 Chronic combined systolic (congestive) and diastolic (congestive) heart failure: Secondary | ICD-10-CM

## 2017-10-20 DIAGNOSIS — Z95810 Presence of automatic (implantable) cardiac defibrillator: Secondary | ICD-10-CM

## 2017-10-20 NOTE — Progress Notes (Signed)
EPIC Encounter for ICM Monitoring  Patient Name: Shane [name] is a 84 y.o. male Date: 10/20/2017 Primary Care Physican: Burns, Stacy J, MD Primary Cardiologist:Nahser/Bensimhon Electrophysiologist: Klein Nephrologist: [place] at [facility] Weight:199 lbs  Bi-V Pacing:95.3%        Patient called to report 5-6 lb weight gain in last week.  In the last 2 weeks weight has increased from 188 lb to 199 lbs.  He does not have any SOB or lower extremity swelling.  He sent remote transmission for review.    Thoracic impedance abnormal suggesting fluid accumulation since 10/01/2017 but close to baseline today.    Prescribed dosage: Torsemide 20 mg 5tablets (100 mg total)daily.  Per office note 09/12/2017 with HF clinic-OK to take metolazone up to twice a week. Metolazone 5 mg take 1 pill as directed by HF clinic if weight goes up 3 lbs overnight, or 5 lbs within one week, call before taking.   Potassium 10 mEq take 20 mEq by mouth daily as needed (with each metolazone dose).  Labs: 09/26/2017 Creatinine 3.20, BUN 84, Potassium 3.6, Sodium 137, EGFR  09/12/2017 Creatinine 2.91, BUN 69, Potassium 3.6, Sodium 136, EGFR 19-22 08/28/2017 Creatinine3.13, BUN86, Potassium4.1, Sodium132, EGFR17-20 08/27/2016 Creatinine3.43, BUN98, Potassium4.3, Sodium130, EGFR15-18  08/26/2016 Creatinine3.29, BUN88, Potassium4.2, Sodium130, EGFR16-19  08/25/2016 Creatinine3.13, BUN83, Potassium3.6, Sodium133, EGFR17-20  08/24/2016 Creatinine2.69, BUN76, Potassium3.5, Sodium133, EGFR21-24  08/23/2016 Creatinine2.78, BUN81, Potassium3.0, Sodium134, EGFR20-23  08/22/2016 Creatinine2.58, BUN79, Potassium3.3, Sodium135, EGFR22-25 @ 8:32 PM  08/22/2016 Creatinine2.67, BUN86, Potassium2.6, Sodium137, EGFR21-24 @ 11:39 AM 06/12/2017 Creatinine 2.44, BUN 40, Potassium 3.6, Sodium 135, EGFR 23-27 06/05/2017 Creatinine 2.23, BUN 53, Potassium 3.7, Sodium 139, EGFR  26-30 05/13/2017 Creatinine 2.43, BUN 56, Potassium 3.8, Sodium 136, EGFR 23-27 05/06/2017 Creatinine 2.83, BUN 90, Potassium 3.7, Sodium 140, EGFR 19-23 Complete list of results can be viewed in results review.  Recommendations: Patient took Metolazone today which is the first one he has taken in 10 days.    Follow-up plan: ICM clinic phone appointment on 11/27/2017.  Office appointment scheduled 10/27/2017 with Dr Bensimhon and 10/27/2017 with Dr Klein.  Copy of ICM check sent to Dr. Klein and Dr. Bensimhon.   3 month ICM trend: 10/20/2017    1 Year ICM trend:       Laurie S Short, RN 10/20/2017 3:02 PM   

## 2017-10-20 NOTE — Telephone Encounter (Signed)
See ICM note for follow up. 

## 2017-10-20 NOTE — Telephone Encounter (Signed)
Patient returned call.  He reported a 10 lb weight gain in the last couple of weeks and 5 of the 10lbs has been in the last week. Weight increased from 188 lb to 199 lbs.  He does not have any SOB or lower extremity swelling.  He will send a remote transmission to check fluid levels and advised I will review and call him back.

## 2017-10-20 NOTE — Telephone Encounter (Signed)
Returned patient call as requested by voice mail message and no answer.  Voice mail message left that I returned call.

## 2017-10-23 ENCOUNTER — Ambulatory Visit (INDEPENDENT_AMBULATORY_CARE_PROVIDER_SITE_OTHER): Payer: Medicare Other | Admitting: Neurology

## 2017-10-23 ENCOUNTER — Other Ambulatory Visit: Payer: Self-pay

## 2017-10-23 ENCOUNTER — Encounter: Payer: Self-pay | Admitting: Neurology

## 2017-10-23 VITALS — BP 130/63 | HR 65 | Ht 70.0 in | Wt 201.5 lb

## 2017-10-23 DIAGNOSIS — I255 Ischemic cardiomyopathy: Secondary | ICD-10-CM | POA: Diagnosis not present

## 2017-10-23 DIAGNOSIS — E1142 Type 2 diabetes mellitus with diabetic polyneuropathy: Secondary | ICD-10-CM | POA: Diagnosis not present

## 2017-10-23 DIAGNOSIS — M1A0690 Idiopathic chronic gout, unspecified knee, without tophus (tophi): Secondary | ICD-10-CM | POA: Diagnosis not present

## 2017-10-23 DIAGNOSIS — R269 Unspecified abnormalities of gait and mobility: Secondary | ICD-10-CM | POA: Diagnosis not present

## 2017-10-23 NOTE — Progress Notes (Signed)
Reason for visit: Peripheral neuropathy  Shane [name] is an 84 y.o. male  History of present illness:  Shane [name] is an 84 year old left-handed white male with a history of diabetes with a peripheral neuropathy.  The patient indicates that he has had some gradual onset of numbness in the legs dating back to the 1990s.  He has a severe peripheral neuropathy at this point that has impacted his balance.  The patient walks with a cane, he has not had any falls since last seen.  He reports no significant discomfort in the feet.  He indicates that he has good days and bad days with sleeping, but it is not due to his neuropathy issue.  The patient recently had a problem with gout affecting the knees bilaterally.  He has significant degenerative arthritis of the knees as well.  The patient is being safe with his ambulation.  He is staying away from step ladders.  His house does not have any steps with exception of one step to get into the house.  The patient returns for an evaluation.  Past Medical History:  Diagnosis Date  . Abnormal nuclear cardiac imaging test Nov 2010   moderate area of infarct in the inferior wall with only minimal reversibility and EF of 28%  . AICD (automatic cardioverter/defibrillator) present   . Allergic rhinitis 01-08-13   Uses nebulizer for chronic sinus issues and Mucinex.  . Arthritis    osteoarthritis   . Asthma    Extrinic  . Blood transfusion    ? at time of bypass surgery   . BPH (benign prostatic hyperplasia)   . Cellulitis of arm, left    MSSA  . CHF (congestive heart failure) ([place])   . CKD (chronic kidney disease), stage III ([place])    "was told due to meds he takes"-no Renal workup done  . Colon polyps    adenomatous  . Coronary artery disease    remote CABG in 1985, cath in 2003 by Dr. Lia Foyer with no PCI, last nuclear in 2010 showing scar and EF of 28%.   . Diabetes mellitus   . Diabetic neuropathy ([place]) 04/25/2017  . Dyslipidemia   . Dyspnea    . Gait abnormality 04/25/2017  . Glaucoma 01-08-13   tx. eye drops  . HOH (hard of hearing)   . HTN (hypertension)   . Hypercholesterolemia   . Left ventricular dysfunction    28% per nuclear in 2010 and 35 to 40% per echo in 2010  . Myocardial infarction ([place])    1985  . Neuromuscular disorder ([place])    legs mild paralysis-able to walk"nerve damage"-legs- left leg brace   . Neuropathy   . Pneumonia    hx of several times years ago   . Spinal stenosis     Past Surgical History:  Procedure Laterality Date  . A/V FISTULAGRAM Right 10/17/2016   Procedure: A/V Fistulagram;  Surgeon: Conrad Silas, MD;  Location: [facility];  Service: Cardiovascular;  Laterality: Right;  . BACK SURGERY     hx of back surgery x 4   . BASCILIC VEIN TRANSPOSITION Right 09/09/2016   Procedure: BASCILIC VEIN TRANSPOSITION Right Arm;  Surgeon: Rosetta Posner, MD;  Location: [facility] OR;  Service: Vascular;  Laterality: Right;  . BI-VENTRICULAR IMPLANTABLE CARDIOVERTER DEFIBRILLATOR N/A 10/10/2014   Procedure: BI-VENTRICULAR IMPLANTABLE CARDIOVERTER DEFIBRILLATOR  (CRT-D);  Surgeon: [name], MD;  Location: [facility] CATH LAB;  Service: Cardiovascular;  Laterality: N/A;  .  CARDIAC CATHETERIZATION  2003  . Cataract      recent cataract surgery 6'14  . COLONOSCOPY N/A 01/25/2013   Procedure: COLONOSCOPY;  Surgeon: Irene Shipper, MD;  Location: [facility];  Service: Endoscopy;  Laterality: N/A;  . CORONARY ARTERY BYPASS GRAFT  1985   x 5 vessels  . LUMBAR DISC SURGERY    . PERIPHERAL VASCULAR BALLOON ANGIOPLASTY Right 10/17/2016   Procedure: Peripheral Vascular Balloon Angioplasty;  Surgeon: Conrad Yaurel, MD;  Location: [place];  Service: Cardiovascular;  Laterality: Right;  ARM VEINOUS AND CENTRAL VEIN  . PR POLYSOM 6/>YRS SLEEP 4/> ADDL PARAM ATTND  12/07/2015  . PROSTATE SURGERY    . TOTAL KNEE ARTHROPLASTY  08/01/2011   Procedure: TOTAL KNEE ARTHROPLASTY;  Surgeon: Johnn Hai;  Location: [facility] ORS;   Service: Orthopedics;  Laterality: Right;    Family History  Problem Relation Age of Onset  . Heart attack Father   . Heart disease Father   . Colon polyps Father   . Lung cancer Mother   . Diabetes Sister        x 2  . Heart disease Brother        x 2  . Bone cancer Brother   . Lung disease Neg Hx     Social history:  reports that he quit smoking about 59 years ago. His smoking use included cigarettes, pipe, and cigars. He has a 5.00 pack-year smoking history. He has never used smokeless tobacco. He reports that he does not drink alcohol or use drugs.    Allergies  Allergen Reactions  . Codeine Other (See Comments)    anxiety  . Hydrocodone Other (See Comments)    Anxiety- can take in liquid form  . Pseudoephedrine Other (See Comments)    Causes heart to race  . Azithromycin Rash    Medications:  Prior to Admission medications   Medication Sig Start Date End Date Taking? Authorizing Provider  acetaminophen (TYLENOL) 500 MG tablet Take 1,000 mg by mouth every 8 (eight) hours as needed (pain).    Yes [provider]  albuterol (PROVENTIL HFA;VENTOLIN HFA) 108 (90 Base) MCG/ACT inhaler Inhale 2 puffs into the lungs every 4 (four) hours as needed for wheezing or shortness of breath (cough, shortness of breath or wheezing.). 02/10/17  Yes Elby Beck, FNP  amiodarone (PACERONE) 200 MG tablet Take 1 tablet (200 mg total) by mouth daily. 10/03/17  Yes Baldwin Jamaica, PA-C  amLODipine (NORVASC) 5 MG tablet Take 1 tablet (5 mg total) by mouth daily. 09/08/17  Yes Burns, Claudina Lick, MD  aspirin EC 81 MG tablet Take 81 mg by mouth daily.    Yes [provider]  atorvastatin (LIPITOR) 10 MG tablet Take 1 tablet (10 mg total) by mouth daily. 09/08/17  Yes Burns, Claudina Lick, MD  carvedilol (COREG) 6.25 MG tablet Take 1 tablet (6.25 mg total) by mouth 2 (two) times daily with a meal. 05/06/17  Yes Tillery, Satira Mccallum, PA-C  cetirizine (ZYRTEC) 10 MG tablet Take 10 mg by  mouth at bedtime as needed (seasonal allergies).    Yes [provider]  dextromethorphan-guaiFENesin (MUCINEX DM) 30-600 MG 12hr tablet Take 1 tablet by mouth 2 (two) times daily as needed for cough. 08/28/17  Yes Rosita Fire, MD  febuxostat (ULORIC) 40 MG tablet Take 1 tablet (40 mg total) by mouth daily. 07/21/17  Yes Burns, Claudina Lick, MD  fluocinonide cream (LIDEX) 0.05 % Apply 1  application topically 2 (two) times daily as needed (for itchy rash).  03/16/15  Yes [provider]  gabapentin (NEURONTIN) 300 MG capsule Take 1 capsule (300 mg total) by mouth at bedtime. 08/14/17  Yes Kathrynn Ducking, MD  hydrALAZINE (APRESOLINE) 50 MG tablet Take 1 tablet (50 mg total) 3 (three) times daily by mouth. 06/19/17  Yes Tillery, Satira Mccallum, PA-C  metolazone (ZAROXOLYN) 5 MG tablet Take 1 pill as directed by HF clinic if weight goes up 3 lbs overnight, or 5 lbs within one week. Call before taking. 05/06/17  Yes Shirley Friar, PA-C  polyethylene glycol [facility] / GLYCOLAX) packet Take 17 g by mouth daily as needed. 08/28/17  Yes Rosita Fire, MD  potassium chloride (K-DUR,KLOR-CON) 10 MEQ tablet Take 20 mEq by mouth as needed (with every metolazone tab).   Yes [provider]  tadalafil (CIALIS) 20 MG tablet Take 20 mg by mouth daily as needed for erectile dysfunction. Do not exceed 2 doses in 7 days   Yes [provider]  testosterone cypionate (DEPOTESTOTERONE CYPIONATE) 100 MG/ML injection Inject 400 mg into the muscle every 21 ( twenty-one) days. For IM use only   Yes [provider]  torsemide (DEMADEX) 20 MG tablet Take 5 tablets (100 mg total) by mouth daily. 05/13/17  Yes Arbutus Leas, NP  triamcinolone (NASACORT AQ) 55 MCG/ACT AERO nasal inhaler Place 2 sprays into the nose daily as needed (seasonal allergies).    Yes [provider]  zolpidem (AMBIEN) 10 MG tablet TAKE 1/2 TO 1 (ONE-HALF TO ONE) TABLET BY MOUTH AT  BEDTIME AS NEEDED FOR SLEEP 09/15/17  Yes Burns, Claudina Lick, MD    ROS:  Out of a complete 14 system review of symptoms, the patient complains only of the following symptoms, and all other reviewed systems are negative.  Gait disorder  Blood pressure 130/63, pulse 65, height 5\' 10"  (1.778 m), weight 201 lb 8 oz (91.4 kg).  Physical Exam  General: The patient is alert and cooperative at the time of the examination.  The patient is moderately obese.  Skin: No significant peripheral edema is noted.  The patient has an ankle brace on the left.   Neurologic Exam  Mental status: The patient is alert and oriented x 3 at the time of the examination. The patient has apparent normal recent and remote memory, with an apparently normal attention span and concentration ability.   Cranial nerves: Facial symmetry is present. Speech is normal, no aphasia or dysarthria is noted. Extraocular movements are full. Visual fields are full.  Motor: The patient has good strength in all 4 extremities, with exception of bilateral foot drops.  Sensory examination: Soft touch sensation is symmetric on the face, arms, and legs.  Coordination: The patient has good finger-nose-finger and heel-to-shin bilaterally.  Gait and station: The patient has a wide-based gait, the patient can walk without assistance.  He usually uses a cane for ambulation.  Tandem gait was not attempted.  Romberg is negative.  Reflexes: Deep tendon reflexes are symmetric, but are depressed.   Assessment/Plan:  1.  Peripheral neuropathy  2.  Diabetes  Fortunately, the patient is not having discomfort with his neuropathy.  There is no indication for medications.  He is being safe with ambulation.  We will check blood work today looking for other etiologies of neuropathy.  He will follow-up through this office if needed.  Jill Alexanders MD 10/23/2017 10:24 AM  [facility] [phone]  [address] [place], [place]  [address]  Phone [phone] Fax [phone]

## 2017-10-24 ENCOUNTER — Telehealth: Payer: Self-pay

## 2017-10-24 DIAGNOSIS — E291 Testicular hypofunction: Secondary | ICD-10-CM | POA: Diagnosis not present

## 2017-10-24 NOTE — Telephone Encounter (Signed)
ICM follow up call to patient.  He reported a weight loss of 2 lbs since Monday.  Weight today is 197 lbs.  He says that he still has a weight gain from 188 lbs to 197 lbs in the last 2 weeks.  Advised him to inform Dr Haroldine Laws at the office appointment on 10/27/2017 of weight gain.  He denied any difficulty breathing and legs have slight swelling.  No changes today.

## 2017-10-27 ENCOUNTER — Encounter (HOSPITAL_COMMUNITY): Payer: Self-pay | Admitting: Internal Medicine

## 2017-10-27 ENCOUNTER — Ambulatory Visit (HOSPITAL_COMMUNITY)
Admission: RE | Admit: 2017-10-27 | Discharge: 2017-10-27 | Disposition: A | Discharge status: Home / Self Care | Payer: Medicare Other | Source: Ambulatory Visit | Attending: Internal Medicine | Admitting: Internal Medicine

## 2017-10-27 ENCOUNTER — Telehealth: Payer: Self-pay | Admitting: Neurology

## 2017-10-27 ENCOUNTER — Ambulatory Visit (INDEPENDENT_AMBULATORY_CARE_PROVIDER_SITE_OTHER): Payer: Medicare Other | Admitting: Internal Medicine

## 2017-10-27 ENCOUNTER — Other Ambulatory Visit: Payer: Self-pay | Admitting: Neurology

## 2017-10-27 VITALS — BP 140/70 | HR 74 | Wt 205.0 lb

## 2017-10-27 DIAGNOSIS — Z79899 Other long term (current) drug therapy: Secondary | ICD-10-CM | POA: Insufficient documentation

## 2017-10-27 DIAGNOSIS — I5022 Chronic systolic (congestive) heart failure: Secondary | ICD-10-CM | POA: Diagnosis not present

## 2017-10-27 DIAGNOSIS — Z87891 Personal history of nicotine dependence: Secondary | ICD-10-CM | POA: Diagnosis not present

## 2017-10-27 DIAGNOSIS — I252 Old myocardial infarction: Secondary | ICD-10-CM | POA: Insufficient documentation

## 2017-10-27 DIAGNOSIS — Z96651 Presence of right artificial knee joint: Secondary | ICD-10-CM | POA: Diagnosis not present

## 2017-10-27 DIAGNOSIS — Z7982 Long term (current) use of aspirin: Secondary | ICD-10-CM | POA: Insufficient documentation

## 2017-10-27 DIAGNOSIS — I2510 Atherosclerotic heart disease of native coronary artery without angina pectoris: Secondary | ICD-10-CM | POA: Insufficient documentation

## 2017-10-27 DIAGNOSIS — Z86010 Personal history of colonic polyps: Secondary | ICD-10-CM | POA: Diagnosis not present

## 2017-10-27 DIAGNOSIS — I5042 Chronic combined systolic (congestive) and diastolic (congestive) heart failure: Secondary | ICD-10-CM | POA: Diagnosis not present

## 2017-10-27 DIAGNOSIS — M1A0620 Idiopathic chronic gout, left knee, without tophus (tophi): Secondary | ICD-10-CM | POA: Diagnosis not present

## 2017-10-27 DIAGNOSIS — J45909 Unspecified asthma, uncomplicated: Secondary | ICD-10-CM | POA: Diagnosis not present

## 2017-10-27 DIAGNOSIS — E1122 Type 2 diabetes mellitus with diabetic chronic kidney disease: Secondary | ICD-10-CM | POA: Diagnosis not present

## 2017-10-27 DIAGNOSIS — I130 Hypertensive heart and chronic kidney disease with heart failure and stage 1 through stage 4 chronic kidney disease, or unspecified chronic kidney disease: Secondary | ICD-10-CM | POA: Diagnosis not present

## 2017-10-27 DIAGNOSIS — Z881 Allergy status to other antibiotic agents status: Secondary | ICD-10-CM | POA: Diagnosis not present

## 2017-10-27 DIAGNOSIS — I255 Ischemic cardiomyopathy: Secondary | ICD-10-CM

## 2017-10-27 DIAGNOSIS — M1990 Unspecified osteoarthritis, unspecified site: Secondary | ICD-10-CM | POA: Diagnosis not present

## 2017-10-27 DIAGNOSIS — N400 Enlarged prostate without lower urinary tract symptoms: Secondary | ICD-10-CM | POA: Insufficient documentation

## 2017-10-27 DIAGNOSIS — E7800 Pure hypercholesterolemia, unspecified: Secondary | ICD-10-CM | POA: Insufficient documentation

## 2017-10-27 DIAGNOSIS — E1140 Type 2 diabetes mellitus with diabetic neuropathy, unspecified: Secondary | ICD-10-CM | POA: Diagnosis not present

## 2017-10-27 DIAGNOSIS — H409 Unspecified glaucoma: Secondary | ICD-10-CM | POA: Insufficient documentation

## 2017-10-27 DIAGNOSIS — Z8371 Family history of colonic polyps: Secondary | ICD-10-CM | POA: Diagnosis not present

## 2017-10-27 DIAGNOSIS — I472 Ventricular tachycardia, unspecified: Secondary | ICD-10-CM

## 2017-10-27 DIAGNOSIS — I447 Left bundle-branch block, unspecified: Secondary | ICD-10-CM

## 2017-10-27 DIAGNOSIS — N184 Chronic kidney disease, stage 4 (severe): Secondary | ICD-10-CM | POA: Diagnosis not present

## 2017-10-27 DIAGNOSIS — Z95810 Presence of automatic (implantable) cardiac defibrillator: Secondary | ICD-10-CM | POA: Diagnosis not present

## 2017-10-27 DIAGNOSIS — Z885 Allergy status to narcotic agent status: Secondary | ICD-10-CM | POA: Diagnosis not present

## 2017-10-27 LAB — CUP PACEART INCLINIC DEVICE CHECK
Battery Remaining Longevity: 33 mo
Battery Voltage: 2.96 V
Brady Statistic AS VS Percent: 2.31 %
Brady Statistic RV Percent Paced: 7.91 %
Date Time Interrogation Session: 20190325132954
HIGH POWER IMPEDANCE MEASURED VALUE: 65 Ohm
Implantable Lead Implant Date: 20160308
Implantable Lead Location: 753859
Implantable Lead Model: 4598
Implantable Lead Model: 6935
Implantable Pulse Generator Implant Date: 20160308
Lead Channel Impedance Value: 342 Ohm
Lead Channel Impedance Value: 399 Ohm
Lead Channel Impedance Value: 456 Ohm
Lead Channel Impedance Value: 456 Ohm
Lead Channel Impedance Value: 494 Ohm
Lead Channel Impedance Value: 608 Ohm
Lead Channel Impedance Value: 722 Ohm
Lead Channel Pacing Threshold Amplitude: 1 V
Lead Channel Sensing Intrinsic Amplitude: 2.375 mV
Lead Channel Sensing Intrinsic Amplitude: 6.125 mV
Lead Channel Sensing Intrinsic Amplitude: 7.125 mV
Lead Channel Setting Pacing Amplitude: 2 V
Lead Channel Setting Pacing Amplitude: 4 V
Lead Channel Setting Pacing Pulse Width: 0.6 ms
Lead Channel Setting Pacing Pulse Width: 1 ms
MDC IDC LEAD IMPLANT DT: 20160308
MDC IDC LEAD IMPLANT DT: 20160308
MDC IDC LEAD LOCATION: 753858
MDC IDC LEAD LOCATION: 753860
MDC IDC MSMT LEADCHNL LV IMPEDANCE VALUE: 323 Ohm
MDC IDC MSMT LEADCHNL LV IMPEDANCE VALUE: 380 Ohm
MDC IDC MSMT LEADCHNL LV IMPEDANCE VALUE: 399 Ohm
MDC IDC MSMT LEADCHNL LV IMPEDANCE VALUE: 570 Ohm
MDC IDC MSMT LEADCHNL LV IMPEDANCE VALUE: 703 Ohm
MDC IDC MSMT LEADCHNL LV IMPEDANCE VALUE: 722 Ohm
MDC IDC MSMT LEADCHNL LV PACING THRESHOLD PULSEWIDTH: 0.6 ms
MDC IDC MSMT LEADCHNL RA PACING THRESHOLD AMPLITUDE: 0.75 V
MDC IDC MSMT LEADCHNL RA PACING THRESHOLD PULSEWIDTH: 0.4 ms
MDC IDC MSMT LEADCHNL RA SENSING INTR AMPL: 3.125 mV
MDC IDC MSMT LEADCHNL RV PACING THRESHOLD AMPLITUDE: 2.375 V
MDC IDC MSMT LEADCHNL RV PACING THRESHOLD PULSEWIDTH: 0.4 ms
MDC IDC SET LEADCHNL RA PACING AMPLITUDE: 2 V
MDC IDC SET LEADCHNL RV SENSING SENSITIVITY: 0.3 mV
MDC IDC STAT BRADY AP VP PERCENT: 0.14 %
MDC IDC STAT BRADY AP VS PERCENT: 0.01 %
MDC IDC STAT BRADY AS VP PERCENT: 97.55 %
MDC IDC STAT BRADY RA PERCENT PACED: 0.15 %

## 2017-10-27 LAB — MULTIPLE MYELOMA PANEL, SERUM
ALBUMIN/GLOB SERPL: 1.2 (ref 0.7–1.7)
ALPHA2 GLOB SERPL ELPH-MCNC: 0.7 g/dL (ref 0.4–1.0)
Albumin SerPl Elph-Mcnc: 3.5 g/dL (ref 2.9–4.4)
Alpha 1: 0.2 g/dL (ref 0.0–0.4)
B-GLOBULIN SERPL ELPH-MCNC: 0.9 g/dL (ref 0.7–1.3)
GAMMA GLOB SERPL ELPH-MCNC: 1.2 g/dL (ref 0.4–1.8)
GLOBULIN, TOTAL: 3 g/dL (ref 2.2–3.9)
IgA/Immunoglobulin A, Serum: 128 mg/dL (ref 61–437)
IgG (Immunoglobin G), Serum: 961 mg/dL (ref 700–1600)
IgM (Immunoglobulin M), Srm: 290 mg/dL — ABNORMAL HIGH (ref 15–143)
Total Protein: 6.5 g/dL (ref 6.0–8.5)

## 2017-10-27 LAB — VITAMIN B12: Vitamin B-12: 762 pg/mL (ref 232–1245)

## 2017-10-27 LAB — BASIC METABOLIC PANEL
Anion gap: 16 — ABNORMAL HIGH (ref 5–15)
BUN: 134 mg/dL — ABNORMAL HIGH (ref 6–20)
CO2: 24 mmol/L (ref 22–32)
Calcium: 9 mg/dL (ref 8.9–10.3)
Chloride: 96 mmol/L — ABNORMAL LOW (ref 101–111)
Creatinine, Ser: 3.64 mg/dL — ABNORMAL HIGH (ref 0.61–1.24)
GFR calc non Af Amer: 14 mL/min — ABNORMAL LOW (ref >60)
GFR, EST AFRICAN AMERICAN: 17 mL/min — AB (ref >60)
Glucose, Bld: 258 mg/dL — ABNORMAL HIGH (ref 65–99)
POTASSIUM: 3.7 mmol/L (ref 3.5–5.1)
SODIUM: 136 mmol/L (ref 135–145)

## 2017-10-27 LAB — B. BURGDORFI ANTIBODIES: Lyme IgG/IgM Ab: <0.91 {ISR} (ref 0.00–0.90)

## 2017-10-27 LAB — URIC ACID
URIC ACID, SERUM: 8.5 mg/dL — AB (ref 4.4–7.6)
Uric Acid: 9.9 mg/dL — ABNORMAL HIGH (ref 3.7–8.6)

## 2017-10-27 LAB — ANA W/REFLEX: Anti Nuclear Antibody(ANA): NEGATIVE

## 2017-10-27 LAB — ANGIOTENSIN CONVERTING ENZYME: Angio Convert Enzyme: 37 U/L (ref 14–82)

## 2017-10-27 NOTE — Progress Notes (Signed)
Patient Care Team: [name], MD as PCP - General (Internal Medicine) Elsie Stain, MD as Consulting Physician (Pulmonary Disease) Madelon Lips, MD as Consulting Physician (Nephrology)   HPI  Shane [name] is a 83 y.o. male Seen in follow-up for CRT-D implanted 3/16.  He has a history of ischemic heart disease with remote bypass surgery  He presented 3/16 with ventricular tachycardia    Echo >> EF 35-40%, Akinesis and scarring of the basal-midinferolateral, inferior, and inferoseptal myocardium. PA pressure 6mmHg. LA moderately dilated.  Myoview>> . No reversible ischemia. Large fixed inferior an inferoapical wall defect, consistent with myocardial infarction. Severe inferior wall hypokinesis and left ventricular dilatation. Left ventricular ejection fraction 22%   He underwent CRT-D implantation in the context of left bundle branch block     DATE  Cr K  3/16 1.97   7/16 2.07   2/17 2.14   2/18 2.43   2/19 3.2 3.6   Hospitalized 9/18 with cardiorenal syndrome.  Again 1/18 following VT and syncope in the context of hypokalemia.  Treated with IV amiodarone>> PO   Date TSH LFTs PFTs  1/19  1.243 17           Lacks energy  Modest dyspnea  occ edema   No chest pain      Past Medical History:  Diagnosis Date  . Abnormal nuclear cardiac imaging test Nov 2010   moderate area of infarct in the inferior wall with only minimal reversibility and EF of 28%  . AICD (automatic cardioverter/defibrillator) present   . Allergic rhinitis 01-08-13   Uses nebulizer for chronic sinus issues and Mucinex.  . Arthritis    osteoarthritis   . Asthma    Extrinic  . Blood transfusion    ? at time of bypass surgery   . BPH (benign prostatic hyperplasia)   . Cellulitis of arm, left    MSSA  . CHF (congestive heart failure) ([place])   . CKD (chronic kidney disease), stage III ([place])    "was told due to meds he takes"-no Renal workup done  . Colon polyps    adenomatous    . Coronary artery disease    remote CABG in 1985, cath in 2003 by Dr. Lia Foyer with no PCI, last nuclear in 2010 showing scar and EF of 28%.   . Diabetes mellitus   . Diabetic neuropathy ([place]) 04/25/2017  . Dyslipidemia   . Dyspnea   . Gait abnormality 04/25/2017  . Glaucoma 01-08-13   tx. eye drops  . HOH (hard of hearing)   . HTN (hypertension)   . Hypercholesterolemia   . Left ventricular dysfunction    28% per nuclear in 2010 and 35 to 40% per echo in 2010  . Myocardial infarction ([place])    1985  . Neuromuscular disorder ([place])    legs mild paralysis-able to walk"nerve damage"-legs- left leg brace   . Neuropathy   . Pneumonia    hx of several times years ago   . Spinal stenosis     Past Surgical History:  Procedure Laterality Date  . A/V FISTULAGRAM Right 10/17/2016   Procedure: A/V Fistulagram;  Surgeon: Conrad Craig, MD;  Location: [facility];  Service: Cardiovascular;  Laterality: Right;  . BACK SURGERY     hx of back surgery x 4   . BASCILIC VEIN TRANSPOSITION Right 09/09/2016   Procedure: BASCILIC VEIN TRANSPOSITION Right Arm;  Surgeon: Rosetta Posner, MD;  [facility] OR;  Service: Vascular;  Laterality: Right;  . BI-VENTRICULAR IMPLANTABLE CARDIOVERTER DEFIBRILLATOR N/A 10/10/2014   Procedure: BI-VENTRICULAR IMPLANTABLE CARDIOVERTER DEFIBRILLATOR  (CRT-D);  Surgeon: [name], MD;  Location: [facility] CATH LAB;  Service: Cardiovascular;  Laterality: N/A;  . CARDIAC CATHETERIZATION  2003  . Cataract      recent cataract surgery 6'14  . COLONOSCOPY N/A 01/25/2013   Procedure: COLONOSCOPY;  Surgeon: Irene Shipper, MD;  [facility];  Service: Endoscopy;  Laterality: N/A;  . CORONARY ARTERY BYPASS GRAFT  1985   x 5 vessels  . LUMBAR DISC SURGERY    . PERIPHERAL VASCULAR BALLOON ANGIOPLASTY Right 10/17/2016   Procedure: Peripheral Vascular Balloon Angioplasty;  Surgeon: Conrad [place], MD;  Location: [facility];  Service: Cardiovascular;  Laterality: Right;   ARM VEINOUS AND CENTRAL VEIN  . PR POLYSOM 6/>YRS SLEEP 4/> ADDL PARAM ATTND  12/07/2015  . PROSTATE SURGERY    . TOTAL KNEE ARTHROPLASTY  08/01/2011   Procedure: TOTAL KNEE ARTHROPLASTY;  Surgeon: Johnn Hai;  [facility]: [facility];  Service: Orthopedics;  Laterality: Right;    Current Outpatient Medications  Medication Sig Dispense Refill  . acetaminophen (TYLENOL) 500 MG tablet Take 1,000 mg by mouth every 8 (eight) hours as needed (pain).     Marland Kitchen albuterol (PROVENTIL HFA;VENTOLIN HFA) 108 (90 Base) MCG/ACT inhaler Inhale 2 puffs into the lungs every 4 (four) hours as needed for wheezing or shortness of breath (cough, shortness of breath or wheezing.). 1 Inhaler 1  . amiodarone (PACERONE) 200 MG tablet Take 1 tablet (200 mg total) by mouth daily. 90 tablet 3  . amLODipine (NORVASC) 5 MG tablet Take 1 tablet (5 mg total) by mouth daily. 30 tablet 1  . aspirin EC 81 MG tablet Take 81 mg by mouth daily.     Marland Kitchen atorvastatin (LIPITOR) 10 MG tablet Take 1 tablet (10 mg total) by mouth daily. 90 tablet 1  . carvedilol (COREG) 6.25 MG tablet Take 1 tablet (6.25 mg total) by mouth 2 (two) times daily with a meal. 60 tablet 5  . cetirizine (ZYRTEC) 10 MG tablet Take 10 mg by mouth at bedtime as needed (seasonal allergies).     Marland Kitchen dextromethorphan-guaiFENesin (MUCINEX DM) 30-600 MG 12hr tablet Take 1 tablet by mouth 2 (two) times daily as needed for cough. 20 tablet 0  . febuxostat (ULORIC) 40 MG tablet Take 1 tablet (40 mg total) by mouth daily. 90 tablet 3  . fluocinonide cream (LIDEX) 2.35 % Apply 1 application topically 2 (two) times daily as needed (for itchy rash).     . gabapentin (NEURONTIN) 300 MG capsule Take 1 capsule (300 mg total) by mouth at bedtime. 30 capsule 1  . hydrALAZINE (APRESOLINE) 50 MG tablet Take 1 tablet (50 mg total) 3 (three) times daily by mouth. 90 tablet 6  . metolazone (ZAROXOLYN) 5 MG tablet Take 1 pill as directed by HF clinic if weight goes up 3 lbs overnight, or 5 lbs  within one week. Call before taking. 15 tablet 3  . polyethylene glycol (MIRALAX / GLYCOLAX) packet Take 17 g by mouth daily as needed. 14 each 0  . potassium chloride (K-DUR,KLOR-CON) 10 MEQ tablet Take 20 mEq by mouth as needed (with every metolazone tab).    . tadalafil (CIALIS) 20 MG tablet Take 20 mg by mouth daily as needed for erectile dysfunction. Do not exceed 2 doses in 7 days    . testosterone cypionate (DEPOTESTOTERONE CYPIONATE) 100  MG/ML injection Inject 400 mg into the muscle every 21 ( twenty-one) days. For IM use only    . torsemide (DEMADEX) 20 MG tablet Take 5 tablets (100 mg total) by mouth daily. 150 tablet 6  . triamcinolone (NASACORT AQ) 55 MCG/ACT AERO nasal inhaler Place 2 sprays into the nose daily as needed (seasonal allergies).     . zolpidem (AMBIEN) 10 MG tablet TAKE 1/2 TO 1 (ONE-HALF TO ONE) TABLET BY MOUTH AT BEDTIME AS NEEDED FOR SLEEP 30 tablet 1   No current facility-administered medications for this visit.     Allergies  Allergen Reactions  . Codeine Other (See Comments)    anxiety  . Hydrocodone Other (See Comments)    Anxiety- can take in liquid form  . Pseudoephedrine Other (See Comments)    Causes heart to race  . Azithromycin Rash    Review of Systems negative except from HPI and PMH  Physical Exam BP 134/64   Pulse 77   Ht 5\' 10"  (1.778 m)   Wt 204 lb (92.5 kg)   SpO2 95%   BMI 29.27 kg/m  Well developed and nourished in no acute distress HENT normal Neck supple with JVP-flat Clear Regular rate and rhythm, no murmurs or gallops Abd-soft with active BS No Clubbing cyanosis edema Skin-warm and dry A & Oriented  Grossly normal sensory and motor function   ECG sinus @ 72 w P-synchronous/ AV  pacing 17/14/46  Assessment and  Plan  Ischemic cardiomyopathy  Congestive heart failure-chronic-systolic  Left Bundle-branch block  CRT-D-[employer]  The patient's device was interrogated and the information was fully reviewed.  The  device was reprogrammed to improve RV pacing outputs albeit he is in adaptive pacing mode  Hypertension   Obstructive sleep apnea/sleep disorder breathing  High RV threshold  Renal insufficiency grade 3  Cr 2.14  2/17    CrCL 38    Without symptoms of ischemia  Euvolemic continue current meds  BP reasonably controlled  We have reprogrammed his RV outputs to subthreshold so as to maximize his battery.  Will let us know if he notices any untoward effects we may have to endure hit to the battery but if we can avoid device generator replacement that would reduced risks.  We will recheck renal function and will also check amio surveillance labs

## 2017-10-27 NOTE — Patient Instructions (Signed)
Labs today  We will contact you in 4 months to schedule your next appointment.  

## 2017-10-27 NOTE — Telephone Encounter (Signed)
I called the patient.  The patient had requested a refill on his gabapentin, on the last visit he indicated that he did not have any significant discomfort from the neuropathy.  The patient will try stopping the gabapentin, if he does find that he needs the medication, he will call back for refill.

## 2017-10-27 NOTE — Progress Notes (Signed)
Patient ID: [name], male   DOB: [birth year], 84 y.o.   MRN: [id number]    Advanced Heart Failure Clinic Note   Date:  10/27/2017   ID:  Shane [name], DOB March 29, 1934, MRN [id number]  PCP:  [name], MD  Cardiologist:   Nahser  Nephrologist: Hollie Salk  History of Present Illness: Shane [name] is a 84 y.o. male with CAD s/p CABG, systolic HF EF 67-12% and CKD 4 referred to the HF Clinic by Dr. Acie Fredrickson.   He is retired from [employer]. Had been doing fine until March or April 2018 when he started having more DOE. Felt he couldn't breath as deeply. MDT ICD was optimized. Took him off Toprol and switched to carvedilol. Also started Entresto. Began to gain fluid. Then lasix was started. (this was new). Entresto stopped. Creatine went from 1.9 to 3.3 then back to to 2.7.   Admitted 05/01/17-05/06/17 with cardiorenal syndrome. Discussed with Dr. Hollie Salk (His nephrologist) who agreed on admission for IV lasix trial. (He does have a patent fistula in place for initiation of HD once required). Pt initially had poor response to IV lasix and Renal consulted with plans for HD initiation. However on day 2, he began to respond to high-dose lasix with increased urine output and decreasing BUN/Cr. BP active meds cut back to allow more room for renal perfusion. Overall he diuresed 9.6 L and down 12 lbs on lasix 120 mg IV BID. Pt examined am of 05/06/17. Discharge weight was 206 pounds.   Admitted 1/18 -> 08/28/17 with ICD shock. Found to have VT in setting of hypokalemia. Diuresis complicated by CKD stage IV. Treated with IV amiodarone for VT, and transitioned to po for home.   He presents today for follow up. Says he is doing well. Able to do all ADLs without too much problem. Taking metolazone about once per week when scale hits 197-198. No orthopnea or PND. No dizziness. Continues to follow with Dr. Hollie Salk in nephrology. Saw Dr. Caryl Comes this am and RV thresholds to maximize battery life. No CP. On  prednisone for gouty flare.  ICD: No shocks. No AF. Fluid up and down. Down today. Activity level back to 3-4 hours/day. 100% biv pacing   Past Medical History:  Diagnosis Date  . Abnormal nuclear cardiac imaging test Nov 2010   moderate area of infarct in the inferior wall with only minimal reversibility and EF of 28%  . AICD (automatic cardioverter/defibrillator) present   . Allergic rhinitis 01-08-13   Uses nebulizer for chronic sinus issues and Mucinex.  . Arthritis    osteoarthritis   . Asthma    Extrinic  . Blood transfusion    ? at time of bypass surgery   . BPH (benign prostatic hyperplasia)   . Cellulitis of arm, left    MSSA  . CHF (congestive heart failure) ([place])   . CKD (chronic kidney disease), stage III ([place])    "was told due to meds he takes"-no Renal workup done  . Colon polyps    adenomatous  . Coronary artery disease    remote CABG in 1985, cath in 2003 by Dr. Lia Foyer with no PCI, last nuclear in 2010 showing scar and EF of 28%.   . Diabetes mellitus   . Diabetic neuropathy ([place]) 04/25/2017  . Dyslipidemia   . Dyspnea   . Gait abnormality 04/25/2017  . Glaucoma 01-08-13   tx. eye drops  . HOH (hard of hearing)   . HTN (hypertension)   .  Hypercholesterolemia   . Left ventricular dysfunction    28% per nuclear in 2010 and 35 to 40% per echo in 2010  . Myocardial infarction ([place])    1985  . Neuromuscular disorder ([place])    legs mild paralysis-able to walk"nerve damage"-legs- left leg brace   . Neuropathy   . Pneumonia    hx of several times years ago   . Spinal stenosis     Past Surgical History:  Procedure Laterality Date  . A/V FISTULAGRAM Right 10/17/2016   Procedure: A/V Fistulagram;  Surgeon: Conrad Metamora, MD;  Location: [facility];  Service: Cardiovascular;  Laterality: Right;  . BACK SURGERY     hx of back surgery x 4   . BASCILIC VEIN TRANSPOSITION Right 09/09/2016   Procedure: BASCILIC VEIN TRANSPOSITION Right Arm;  Surgeon: Rosetta Posner,  MD;  Location[facility];  Service: Vascular;  Laterality: Right;  . BI-VENTRICULAR IMPLANTABLE CARDIOVERTER DEFIBRILLATOR N/A 10/10/2014   Procedure: BI-VENTRICULAR IMPLANTABLE CARDIOVERTER DEFIBRILLATOR  (CRT-D);  Surgeon: [name], MD;  Location: [facility] CATH LAB;  Service: Cardiovascular;  Laterality: N/A;  . CARDIAC CATHETERIZATION  2003  . Cataract      recent cataract surgery 6'14  . COLONOSCOPY N/A 01/25/2013   Procedure: COLONOSCOPY;  Surgeon: Irene Shipper, MD;  Location: [facility];  Service: Endoscopy;  Laterality: N/A;  . CORONARY ARTERY BYPASS GRAFT  1985   x 5 vessels  . LUMBAR DISC SURGERY    . PERIPHERAL VASCULAR BALLOON ANGIOPLASTY Right 10/17/2016   Procedure: Peripheral Vascular Balloon Angioplasty;  Surgeon: Conrad Guaynabo, MD;  Location: [facility];  Service: Cardiovascular;  Laterality: Right;  ARM VEINOUS AND CENTRAL VEIN  . PR POLYSOM 6/>YRS SLEEP 4/> ADDL PARAM ATTND  12/07/2015  . PROSTATE SURGERY    . TOTAL KNEE ARTHROPLASTY  08/01/2011   Procedure: TOTAL KNEE ARTHROPLASTY;  Surgeon: Johnn Hai;  Location: [facility];  Service: Orthopedics;  Laterality: Right;    Current Outpatient Medications  Medication Sig Dispense Refill  . acetaminophen (TYLENOL) 500 MG tablet Take 1,000 mg by mouth every 8 (eight) hours as needed (pain).     Marland Kitchen albuterol (PROVENTIL HFA;VENTOLIN HFA) 108 (90 Base) MCG/ACT inhaler Inhale 2 puffs into the lungs every 4 (four) hours as needed for wheezing or shortness of breath (cough, shortness of breath or wheezing.). 1 Inhaler 1  . amiodarone (PACERONE) 200 MG tablet Take 1 tablet (200 mg total) by mouth daily. 90 tablet 3  . amLODipine (NORVASC) 5 MG tablet Take 1 tablet (5 mg total) by mouth daily. 30 tablet 1  . aspirin EC 81 MG tablet Take 81 mg by mouth daily.     Marland Kitchen atorvastatin (LIPITOR) 10 MG tablet Take 1 tablet (10 mg total) by mouth daily. 90 tablet 1  . carvedilol (COREG) 6.25 MG tablet Take 1 tablet (6.25 mg total) by mouth 2  (two) times daily with a meal. 60 tablet 5  . cetirizine (ZYRTEC) 10 MG tablet Take 10 mg by mouth at bedtime as needed (seasonal allergies).     Marland Kitchen dextromethorphan-guaiFENesin (MUCINEX DM) 30-600 MG 12hr tablet Take 1 tablet by mouth 2 (two) times daily as needed for cough. 20 tablet 0  . febuxostat (ULORIC) 40 MG tablet Take 1 tablet (40 mg total) by mouth daily. 90 tablet 3  . fluocinonide cream (LIDEX) 3.23 % Apply 1 application topically 2 (two) times daily as needed (for itchy rash).     Marland Kitchen  gabapentin (NEURONTIN) 300 MG capsule Take 1 capsule (300 mg total) by mouth at bedtime. 30 capsule 1  . hydrALAZINE (APRESOLINE) 50 MG tablet Take 1 tablet (50 mg total) 3 (three) times daily by mouth. 90 tablet 6  . polyethylene glycol (MIRALAX / GLYCOLAX) packet Take 17 g by mouth daily as needed. 14 each 0  . potassium chloride (K-DUR,KLOR-CON) 10 MEQ tablet Take 20 mEq by mouth as needed (with every metolazone tab).    . potassium chloride SA (K-DUR,KLOR-CON) 20 MEQ tablet Take 20 mEq by mouth daily.    . tadalafil (CIALIS) 20 MG tablet Take 20 mg by mouth daily as needed for erectile dysfunction. Do not exceed 2 doses in 7 days    . testosterone cypionate (DEPOTESTOTERONE CYPIONATE) 100 MG/ML injection Inject 400 mg into the muscle every 21 ( twenty-one) days. For IM use only    . torsemide (DEMADEX) 20 MG tablet Take 5 tablets (100 mg total) by mouth daily. 150 tablet 6  . triamcinolone (NASACORT AQ) 55 MCG/ACT AERO nasal inhaler Place 2 sprays into the nose daily as needed (seasonal allergies).     . zolpidem (AMBIEN) 10 MG tablet TAKE 1/2 TO 1 (ONE-HALF TO ONE) TABLET BY MOUTH AT BEDTIME AS NEEDED FOR SLEEP 30 tablet 1  . metolazone (ZAROXOLYN) 5 MG tablet Take 1 pill as directed by HF clinic if weight goes up 3 lbs overnight, or 5 lbs within one week. Call before taking. (Patient not taking: Reported on 10/27/2017) 15 tablet 3   No current facility-administered medications for this encounter.     Allergies:   Codeine; Hydrocodone; Pseudoephedrine; and Azithromycin   Social History:  The patient  reports that he quit smoking about 59 years ago. His smoking use included cigarettes, pipe, and cigars. He has a 5.00 pack-year smoking history. He has never used smokeless tobacco. He reports that he does not drink alcohol or use drugs.   Family History:  The patient's family history includes Bone cancer in his brother; Colon polyps in his father; Diabetes in his sister; Heart attack in his father; Heart disease in his brother and father; Lung cancer in his mother.  Review of systems complete and found to be negative unless listed in HPI.     PHYSICAL EXAM: Vitals:   10/27/17 1343  BP: 140/70  Pulse: 74  SpO2: 96%  Weight: 205 lb (93 kg)     Wt Readings from Last 3 Encounters:  10/27/17 205 lb (93 kg)  10/27/17 204 lb (92.5 kg)  10/23/17 201 lb 8 oz (91.4 kg)    Physical Exam General:  Well appearing. No resp difficulty HEENT: normal Neck: supple. no JVD 7. Carotids 2+ bilat; no bruits. No lymphadenopathy or thryomegaly appreciated. Cor: PMI nondisplaced. Regular rate & rhythm. 2/6 SEM RUSB Lungs: clear Abdomen: soft, nontender, nondistended. No hepatosplenomegaly. No bruits or masses. Good bowel sounds. Extremities: no cyanosis, clubbing, rash,1+ edema  RUE AVF Neuro: alert & orientedx3, cranial nerves grossly intact. moves all 4 extremities w/o difficulty. Affect pleasant   ASSESSMENT AND PLAN: 1. Chronic systolic congestive heart failure: ICM. Echo EF 40-45% in 11/2015.  - Stable. NYHA II. - Volume status up and down. Takes torsemide 100 daily with metolazone as needed.  - ICD interrogated personally and no recurrent VT. Optivol up and down but now down. Activity 3-4 hours per day. .  - Continue Coreg 6.25 mg BID - Reinforced fluid restriction to < 2 L daily, sodium restriction to less than 2000  mg daily, and the importance of daily weights.    2. Coronary artery  disease: history of remote coronary artery bypass grafting 1985 - No s/s ischemia - Last cath 2003, LM and RCA occluded. LIMA to LAD patent. RIMA to OM patent. SVG to PDA occluded.   3. CKD 4 - Baseline creatinine now 3.0-3.5 - Follows with Dr. Hollie Salk. Have previously discussed, pt nearing dialysis. Has AVF in place  - Check labs today. Be careful not to overdiurese with metolazone  4. Hyperlipidemia - Continue atorvastatin. No change.   5. Diabetes mellitus - Per PCP.   6. HTN - Meds as above.   7. VT - Continue amiodarone. No VT on ICD today. - Follows with EP. Saw Dr. Caryl Comes earlier today.    Glori Bickers, MD  2:17 PM

## 2017-10-27 NOTE — Patient Instructions (Addendum)
Medication Instructions:  Your physician recommends that you continue on your current medications as directed. Please refer to the Current Medication list given to you today.  Labwork: You will have CMET, CBC, and TSH drawn today  Testing/Procedures: None ordered.  Follow-Up: Your physician recommends that you schedule a follow-up appointment in:   6 months with Tommye Standard, NP  Remote monitoring is used to monitor your ICD from home. This monitoring reduces the number of office visits required to check your device to one time per year. It allows [place] to keep an eye on the functioning of your device to ensure it is working properly. You are scheduled for a device check from home on 11/27/2017. You may send your transmission at any time that day. If you have a wireless device, the transmission will be sent automatically. After your physician reviews your transmission, you will receive a postcard with your next transmission date.   Any Other Special Instructions Will Be Listed Below (If Applicable).     If you need a refill on your cardiac medications before your next appointment, please call your pharmacy.

## 2017-10-28 ENCOUNTER — Telehealth: Payer: Self-pay | Admitting: Pulmonary Disease

## 2017-10-28 ENCOUNTER — Telehealth: Payer: Self-pay | Admitting: *Deleted

## 2017-10-28 LAB — TSH: TSH: 1.43 u[IU]/mL (ref 0.450–4.500)

## 2017-10-28 LAB — CBC WITH DIFFERENTIAL/PLATELET
BASOS ABS: 0 10*3/uL (ref 0.0–0.2)
Basos: 0 %
EOS (ABSOLUTE): 0 10*3/uL (ref 0.0–0.4)
Eos: 0 %
HEMATOCRIT: 36.4 % — AB (ref 37.5–51.0)
HEMOGLOBIN: 11.5 g/dL — AB (ref 13.0–17.7)
Immature Grans (Abs): 0 10*3/uL (ref 0.0–0.1)
Immature Granulocytes: 0 %
LYMPHS ABS: 0.6 10*3/uL — AB (ref 0.7–3.1)
LYMPHS: 9 %
MCH: 30 pg (ref 26.6–33.0)
MCHC: 31.6 g/dL (ref 31.5–35.7)
MCV: 95 fL (ref 79–97)
MONOCYTES: 5 %
Monocytes Absolute: 0.3 10*3/uL (ref 0.1–0.9)
Neutrophils Absolute: 5.2 10*3/uL (ref 1.4–7.0)
Neutrophils: 86 %
Platelets: 100 10*3/uL — CL (ref 150–379)
RBC: 3.83 x10E6/uL — AB (ref 4.14–5.80)
RDW: 15.6 % — AB (ref 12.3–15.4)
WBC: 6 10*3/uL (ref 3.4–10.8)

## 2017-10-28 LAB — COMPREHENSIVE METABOLIC PANEL
A/G RATIO: 1.6 (ref 1.2–2.2)
ALK PHOS: 56 IU/L (ref 39–117)
ALT: 35 IU/L (ref 0–44)
AST: 25 IU/L (ref 0–40)
Albumin: 4.1 g/dL (ref 3.5–4.7)
BUN / CREAT RATIO: 29 — AB (ref 10–24)
BUN: 109 mg/dL — AB (ref 8–27)
Bilirubin Total: 0.7 mg/dL (ref 0.0–1.2)
CHLORIDE: 93 mmol/L — AB (ref 96–106)
CO2: 24 mmol/L (ref 20–29)
Calcium: 9 mg/dL (ref 8.6–10.2)
Creatinine, Ser: 3.7 mg/dL — ABNORMAL HIGH (ref 0.76–1.27)
GFR calc Af Amer: 17 mL/min/{1.73_m2} — ABNORMAL LOW (ref >59)
GFR calc non Af Amer: 14 mL/min/{1.73_m2} — ABNORMAL LOW (ref >59)
GLUCOSE: 329 mg/dL — AB (ref 65–99)
Globulin, Total: 2.5 g/dL (ref 1.5–4.5)
POTASSIUM: 3.6 mmol/L (ref 3.5–5.2)
Sodium: 136 mmol/L (ref 134–144)
Total Protein: 6.6 g/dL (ref 6.0–8.5)

## 2017-10-28 NOTE — Addendum Note (Signed)
Addended by: Campbell Riches on: 10/28/2017 12:08 PM   Modules accepted: Orders

## 2017-10-28 NOTE — Progress Notes (Signed)
Subjective:    Patient ID: Shane [name], male    DOB: 05/21/1934, 84 y.o.   MRN: [id number]  HPI The patient is here for an acute visit.  Gout:  He has chronic gout in his both knees.  He is taking uloric 40 mg daily.  He has chronic b/l knee pain.  Orthopedics has confirmed this with aspiration and intraarticular steroid injections has helped.   He denies knee swelling - just pain.  He does ride his bike some days, but not when the knee pain is severe. He is taking tylenol.    Diabetes: He is controlling his sugars with diet. He is compliant with a diabetic diet. He is exercising regularly - rides his exercise bike 45 min most days.    Estimated Creatinine Clearance: 17.9 mL/min (A) (by C-G formula based on SCr of 3.64 mg/dL (H)).    Medications and allergies reviewed with patient and updated if appropriate.  Patient Active Problem List   Diagnosis Date Noted  . Fatigue 09/08/2017  . Hypokalemia 08/22/2017  . Elevated troponin 08/22/2017  . ICD (implantable cardioverter-defibrillator) discharge 08/22/2017  . Ventricular tachycardia ([place]) 08/22/2017  . Chronic gout of left knee 07/21/2017  . Diabetic neuropathy ([place]) 04/25/2017  . Gait abnormality 04/25/2017  . Numbness and tingling of both feet 03/04/2017  . Peripheral neuropathy 03/04/2017  . Carotid artery disease ([place]) 01/09/2017  . Chronic combined systolic and diastolic CHF (congestive heart failure) ([place]) 07/01/2016  . Restrictive lung disease 01/22/2016  . Severe obstructive sleep apnea 12/17/2015  . Essential hypertension 11/16/2015  . 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 LBBB (left bundle branch block) 10/10/2014  . Syncope 10/06/2014  . Pain in thumb joint with movement of right hand 01/04/2014  . Sinusitis, chronic 07/16/2013  . Asthma, moderate persistent 07/08/2013  . Benign prostatic hypertrophy without urinary obstruction 12/18/2011  . CAD s/p coronary arthery bypass graft   . Chronic kidney disease (CKD),  stage IV (severe) ([place])   . H/O Spinal stenosis   . DM (diabetes mellitus), type 2 with renal complications ([place]) 89/37/3428  . Benign hypertensive heart disease without heart failure 11/07/2010  . Allergic rhinitis 11/07/2010  . Osteoarthritis 11/07/2010  . Ischemic cardiomyopathy   . Hypercholesterolemia     Current Outpatient Medications on File Prior to Visit  Medication Sig Dispense Refill  . acetaminophen (TYLENOL) 500 MG tablet Take 1,000 mg by mouth every 8 (eight) hours as needed (pain).     Marland Kitchen albuterol (PROVENTIL HFA;VENTOLIN HFA) 108 (90 Base) MCG/ACT inhaler Inhale 2 puffs into the lungs every 4 (four) hours as needed for wheezing or shortness of breath (cough, shortness of breath or wheezing.). 1 Inhaler 1  . amiodarone (PACERONE) 200 MG tablet Take 1 tablet (200 mg total) by mouth daily. 90 tablet 3  . amLODipine (NORVASC) 5 MG tablet Take 1 tablet (5 mg total) by mouth daily. 30 tablet 1  . aspirin EC 81 MG tablet Take 81 mg by mouth daily.     Marland Kitchen atorvastatin (LIPITOR) 10 MG tablet Take 1 tablet (10 mg total) by mouth daily. 90 tablet 1  . carvedilol (COREG) 6.25 MG tablet Take 1 tablet (6.25 mg total) by mouth 2 (two) times daily with a meal. 60 tablet 5  . cetirizine (ZYRTEC) 10 MG tablet Take 10 mg by mouth at bedtime as needed (seasonal allergies).     Marland Kitchen dextromethorphan-guaiFENesin (MUCINEX DM) 30-600 MG 12hr tablet Take 1 tablet by mouth 2 (two) times daily as  needed for cough. 20 tablet 0  . febuxostat (ULORIC) 40 MG tablet Take 1 tablet (40 mg total) by mouth daily. 90 tablet 3  . fluocinonide cream (LIDEX) 4.74 % Apply 1 application topically 2 (two) times daily as needed (for itchy rash).     . hydrALAZINE (APRESOLINE) 50 MG tablet Take 1 tablet (50 mg total) 3 (three) times daily by mouth. 90 tablet 6  . metolazone (ZAROXOLYN) 5 MG tablet Take 1 pill as directed by HF clinic if weight goes up 3 lbs overnight, or 5 lbs within one week. Call before taking. 15 tablet 3    . potassium chloride SA (K-DUR,KLOR-CON) 20 MEQ tablet Take 20 mEq by mouth daily.    . tadalafil (CIALIS) 20 MG tablet Take 20 mg by mouth daily as needed for erectile dysfunction. Do not exceed 2 doses in 7 days    . testosterone cypionate (DEPOTESTOTERONE CYPIONATE) 100 MG/ML injection Inject 400 mg into the muscle every 21 ( twenty-one) days. For IM use only    . torsemide (DEMADEX) 20 MG tablet Take 5 tablets (100 mg total) by mouth daily. 150 tablet 6  . triamcinolone (NASACORT AQ) 55 MCG/ACT AERO nasal inhaler Place 2 sprays into the nose daily as needed (seasonal allergies).     . zolpidem (AMBIEN) 10 MG tablet TAKE 1/2 TO 1 (ONE-HALF TO ONE) TABLET BY MOUTH AT BEDTIME AS NEEDED FOR SLEEP 30 tablet 1   No current facility-administered medications on file prior to visit.     Past Medical History:  Diagnosis Date  . Abnormal nuclear cardiac imaging test Nov 2010   moderate area of infarct in the inferior wall with only minimal reversibility and EF of 28%  . AICD (automatic cardioverter/defibrillator) present   . Allergic rhinitis 01-08-13   Uses nebulizer for chronic sinus issues and Mucinex.  . Arthritis    osteoarthritis   . Asthma    Extrinic  . Blood transfusion    ? at time of bypass surgery   . BPH (benign prostatic hyperplasia)   . Cellulitis of arm, left    MSSA  . CHF (congestive heart failure) ([place])   . CKD (chronic kidney disease), stage III ([place])    "was told due to meds he takes"-no Renal workup done  . Colon polyps    adenomatous  . Coronary artery disease    remote CABG in 1985, cath in 2003 by Dr. Lia Foyer with no PCI, last nuclear in 2010 showing scar and EF of 28%.   . Diabetes mellitus   . Diabetic neuropathy ([place]) 04/25/2017  . Dyslipidemia   . Dyspnea   . Gait abnormality 04/25/2017  . Glaucoma 01-08-13   tx. eye drops  . HOH (hard of hearing)   . HTN (hypertension)   . Hypercholesterolemia   . Left ventricular dysfunction    28% per nuclear in 2010  and 35 to 40% per echo in 2010  . Myocardial infarction ([place])    1985  . Neuromuscular disorder ([place])    legs mild paralysis-able to walk"nerve damage"-legs- left leg brace   . Neuropathy   . Pneumonia    hx of several times years ago   . Spinal stenosis     Past Surgical History:  Procedure Laterality Date  . A/V FISTULAGRAM Right 10/17/2016   Procedure: A/V Fistulagram;  Surgeon: Conrad [place], MD;  Location: [place] [facility];  Service: Cardiovascular;  Laterality: Right;  . BACK SURGERY     hx  of back surgery x 4   . BASCILIC VEIN TRANSPOSITION Right 09/09/2016   Procedure: BASCILIC VEIN TRANSPOSITION Right Arm;  Surgeon: Rosetta Posner, MD;  Location: [facility] OR;  Service: Vascular;  Laterality: Right;  . BI-VENTRICULAR IMPLANTABLE CARDIOVERTER DEFIBRILLATOR N/A 10/10/2014   Procedure: BI-VENTRICULAR IMPLANTABLE CARDIOVERTER DEFIBRILLATOR  (CRT-D);  Surgeon: [name], MD;  Location: [facility] CATH LAB;  Service: Cardiovascular;  Laterality: N/A;  . CARDIAC CATHETERIZATION  2003  . Cataract      recent cataract surgery 6'14  . COLONOSCOPY N/A 01/25/2013   Procedure: COLONOSCOPY;  Surgeon: Irene Shipper, MD;  [facility];  Service: Endoscopy;  Laterality: N/A;  . CORONARY ARTERY BYPASS GRAFT  1985   x 5 vessels  . LUMBAR DISC SURGERY    . PERIPHERAL VASCULAR BALLOON ANGIOPLASTY Right 10/17/2016   Procedure: Peripheral Vascular Balloon Angioplasty;  Surgeon: Conrad [name], MD;  Location: [place] [facility];  Service: Cardiovascular;  Laterality: Right;  ARM VEINOUS AND CENTRAL VEIN  . PR POLYSOM 6/>YRS SLEEP 4/> ADDL PARAM ATTND  12/07/2015  . PROSTATE SURGERY    . TOTAL KNEE ARTHROPLASTY  08/01/2011   Procedure: TOTAL KNEE ARTHROPLASTY;  Surgeon: Johnn Hai;  [facility];  Service: Orthopedics;  Laterality: Right;    Social History   Socioeconomic History  . Marital status: Married    Spouse name: Not on file  . Number of children: 2  . Years of education: Not on file    . Highest education level: Not on file  Occupational History  . Occupation: Theme park manager  Social Needs  . Financial resource strain: Not on file  . Food insecurity:    Worry: Not on file    Inability: Not on file  . Transportation needs:    Medical: Not on file    Non-medical: Not on file  Tobacco Use  . Smoking status: Former Smoker    Packs/day: 1.00    Years: 5.00    Pack years: 5.00    Types: Cigarettes, Pipe, Cigars    Last attempt to quit: 08/05/1958    Years since quitting: 59.2  . Smokeless tobacco: Never Used  Substance and Sexual Activity  . Alcohol use: No  . Drug use: No  . Sexual activity: Not Currently  Lifestyle  . Physical activity:    Days per week: Not on file    Minutes per session: Not on file  . Stress: Not on file  Relationships  . Social connections:    Talks on phone: Not on file    Gets together: Not on file    Attends religious service: Not on file    Active member of club or organization: Not on file    Attends meetings of clubs or organizations: Not on file    Relationship status: Not on file  Other Topics Concern  . Not on file  Social History Narrative   Originally from [place]. He has always lived in [place]. Prior travel to [place], [place], [place], [place] ,[place], & [place]. 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 Has adopted children. Has a dog currently. No bird exposure. No mold exposure. Enjoys reading & traveling.    Family History  Problem Relation Age of Onset  . Heart attack Father   . Heart disease Father   . Colon polyps Father   . Lung cancer Mother   . Diabetes Sister        x 2  . Heart disease  Brother        x 2  . Bone cancer Brother   . Lung disease Neg Hx     Review of Systems  Constitutional: Negative for chills and fever.  Musculoskeletal: Positive for arthralgias. Negative for joint swelling.  Skin: Negative for color change and rash.       Objective:   Vitals:   10/29/17 1307  BP: 122/64   Pulse: 68  Resp: 16  Temp: (!) 97.4 F (36.3 C)  SpO2: 97%   BP Readings from Last 3 Encounters:  10/29/17 122/64  10/27/17 140/70  10/27/17 134/64   Wt Readings from Last 3 Encounters:  10/29/17 204 lb (92.5 kg)  10/27/17 205 lb (93 kg)  10/27/17 204 lb (92.5 kg)   Body mass index is 29.27 kg/m.   Physical Exam  Constitutional: He appears well-developed and well-nourished. No distress.  HENT:  Head: Normocephalic and atraumatic.  Musculoskeletal: He exhibits no edema (wearing compression socks).  No tenderness with palpation, no joint swelling  Skin: Skin is warm and dry. He is not diaphoretic.           Assessment & Plan:    See Problem List for Assessment and Plan of chronic medical problems.

## 2017-10-28 NOTE — Telephone Encounter (Signed)
Called and spoke with patient. Relayed per CW,MD that labs unremarkable except that uric acid level still elevated, he does have hx gout. He verbalized understanding and appreciation for call.

## 2017-10-28 NOTE — Telephone Encounter (Signed)
-----   Message from Kathrynn Ducking, MD sent at 10/27/2017  4:49 PM EDT ----- Blood work is unremarkable except that the uric acid level is still elevated, the patient has a history of gout.  Please call the patient. ----- Message ----- From: Lavone Neri Lab Results In Sent: 10/24/2017   7:41 AM To: Kathrynn Ducking, MD

## 2017-10-28 NOTE — Telephone Encounter (Signed)
Prefilled Syringe: #150mg  4  #75mg  0 Ordered Date: 10/28/2017 Shipping Date: 10/28/2017

## 2017-10-29 ENCOUNTER — Encounter: Payer: Self-pay | Admitting: Internal Medicine

## 2017-10-29 ENCOUNTER — Ambulatory Visit (INDEPENDENT_AMBULATORY_CARE_PROVIDER_SITE_OTHER): Payer: Medicare Other | Admitting: Internal Medicine

## 2017-10-29 VITALS — BP 122/64 | HR 68 | Temp 97.4°F | Resp 16 | Wt 204.0 lb

## 2017-10-29 DIAGNOSIS — M1A0620 Idiopathic chronic gout, left knee, without tophus (tophi): Secondary | ICD-10-CM

## 2017-10-29 DIAGNOSIS — I255 Ischemic cardiomyopathy: Secondary | ICD-10-CM | POA: Diagnosis not present

## 2017-10-29 DIAGNOSIS — E1122 Type 2 diabetes mellitus with diabetic chronic kidney disease: Secondary | ICD-10-CM

## 2017-10-29 DIAGNOSIS — N184 Chronic kidney disease, stage 4 (severe): Secondary | ICD-10-CM | POA: Diagnosis not present

## 2017-10-29 MED ORDER — COLCHICINE 0.6 MG PO TABS
0.3000 mg | ORAL_TABLET | Freq: Every day | ORAL | Status: DC
Start: 2017-10-29 — End: 2017-12-19

## 2017-10-29 NOTE — Assessment & Plan Note (Signed)
Chronic gout in both knees Uric acid level elevated Taking uloric 40 mg daily - unable to increase due to low Cr Cl Start colchicine 0.3 mg daily Has f/u with Dr Hollie Salk on 4/10 - he will discuss this with her and discuss other options - kidney function to be rechecked then

## 2017-10-29 NOTE — Patient Instructions (Addendum)
  Medications reviewed and updated.  Changes include start colchicine 0.3 mg daily.  Discuss this with Dr Hollie Salk when you see her.      Please followup in 6 months

## 2017-10-29 NOTE — Assessment & Plan Note (Signed)
Diet controlled Lab Results  Component Value Date   HGBA1C 6.6 (H) 08/23/2017    Follow up in 6 months

## 2017-10-29 NOTE — Assessment & Plan Note (Signed)
Estimated Creatinine Clearance: 17.9 mL/min (A) (by C-G formula based on SCr of 3.64 mg/dL (H)).   Sees Dr Hollie Salk 4/10

## 2017-10-30 ENCOUNTER — Ambulatory Visit: Payer: Medicare Other

## 2017-10-30 ENCOUNTER — Encounter: Payer: Medicare Other | Admitting: Internal Medicine

## 2017-10-30 NOTE — Telephone Encounter (Signed)
Prefilled Syringes: # 150mg  4  #75mg  0 Arrival Date: 10/29/2017 Lot #: 150mg  [id number]      75mg  0 Exp Date: 150mg  07/2018   75mg  0\ Ok to put in today per [place].

## 2017-10-31 ENCOUNTER — Ambulatory Visit (INDEPENDENT_AMBULATORY_CARE_PROVIDER_SITE_OTHER): Payer: Medicare Other

## 2017-10-31 DIAGNOSIS — J4540 Moderate persistent asthma, uncomplicated: Secondary | ICD-10-CM

## 2017-11-03 DIAGNOSIS — N186 End stage renal disease: Secondary | ICD-10-CM | POA: Diagnosis not present

## 2017-11-03 DIAGNOSIS — Z992 Dependence on renal dialysis: Secondary | ICD-10-CM | POA: Diagnosis not present

## 2017-11-03 DIAGNOSIS — I129 Hypertensive chronic kidney disease with stage 1 through stage 4 chronic kidney disease, or unspecified chronic kidney disease: Secondary | ICD-10-CM | POA: Diagnosis not present

## 2017-11-12 DIAGNOSIS — E876 Hypokalemia: Secondary | ICD-10-CM | POA: Diagnosis not present

## 2017-11-12 DIAGNOSIS — N184 Chronic kidney disease, stage 4 (severe): Secondary | ICD-10-CM | POA: Diagnosis not present

## 2017-11-12 DIAGNOSIS — J40 Bronchitis, not specified as acute or chronic: Secondary | ICD-10-CM | POA: Diagnosis not present

## 2017-11-12 DIAGNOSIS — D649 Anemia, unspecified: Secondary | ICD-10-CM | POA: Diagnosis not present

## 2017-11-12 DIAGNOSIS — N185 Chronic kidney disease, stage 5: Secondary | ICD-10-CM | POA: Diagnosis not present

## 2017-11-12 DIAGNOSIS — I5022 Chronic systolic (congestive) heart failure: Secondary | ICD-10-CM | POA: Diagnosis not present

## 2017-11-12 DIAGNOSIS — N2581 Secondary hyperparathyroidism of renal origin: Secondary | ICD-10-CM | POA: Diagnosis not present

## 2017-11-14 ENCOUNTER — Ambulatory Visit (INDEPENDENT_AMBULATORY_CARE_PROVIDER_SITE_OTHER): Payer: Self-pay | Admitting: *Deleted

## 2017-11-14 ENCOUNTER — Other Ambulatory Visit (HOSPITAL_COMMUNITY): Payer: Self-pay | Admitting: Student

## 2017-11-14 ENCOUNTER — Telehealth: Payer: Self-pay | Admitting: *Deleted

## 2017-11-14 ENCOUNTER — Ambulatory Visit: Payer: Medicare Other

## 2017-11-14 DIAGNOSIS — I472 Ventricular tachycardia, unspecified: Secondary | ICD-10-CM

## 2017-11-14 LAB — CUP PACEART INCLINIC DEVICE CHECK
Battery Remaining Longevity: 31 mo
Battery Voltage: 2.96 V
Brady Statistic AP VS Percent: 0.02 %
Brady Statistic AS VP Percent: 97.36 %
Brady Statistic AS VS Percent: 2.53 %
Date Time Interrogation Session: 20190412152856
HIGH POWER IMPEDANCE MEASURED VALUE: 66 Ohm
Implantable Lead Implant Date: 20160308
Implantable Lead Implant Date: 20160308
Implantable Lead Location: 753859
Implantable Lead Location: 753860
Implantable Lead Model: 4598
Implantable Pulse Generator Implant Date: 20160308
Lead Channel Impedance Value: 285 Ohm
Lead Channel Impedance Value: 323 Ohm
Lead Channel Impedance Value: 380 Ohm
Lead Channel Impedance Value: 437 Ohm
Lead Channel Impedance Value: 646 Ohm
Lead Channel Impedance Value: 703 Ohm
Lead Channel Pacing Threshold Amplitude: 1 V
Lead Channel Pacing Threshold Pulse Width: 0.4 ms
Lead Channel Pacing Threshold Pulse Width: 0.4 ms
Lead Channel Pacing Threshold Pulse Width: 0.6 ms
Lead Channel Setting Pacing Amplitude: 2 V
Lead Channel Setting Pacing Amplitude: 4 V
Lead Channel Setting Pacing Pulse Width: 0.8 ms
Lead Channel Setting Sensing Sensitivity: 0.3 mV
MDC IDC LEAD IMPLANT DT: 20160308
MDC IDC LEAD LOCATION: 753858
MDC IDC MSMT LEADCHNL LV IMPEDANCE VALUE: 380 Ohm
MDC IDC MSMT LEADCHNL LV IMPEDANCE VALUE: 456 Ohm
MDC IDC MSMT LEADCHNL LV IMPEDANCE VALUE: 456 Ohm
MDC IDC MSMT LEADCHNL LV IMPEDANCE VALUE: 570 Ohm
MDC IDC MSMT LEADCHNL LV IMPEDANCE VALUE: 570 Ohm
MDC IDC MSMT LEADCHNL LV IMPEDANCE VALUE: 703 Ohm
MDC IDC MSMT LEADCHNL LV PACING THRESHOLD AMPLITUDE: 1 V
MDC IDC MSMT LEADCHNL RA SENSING INTR AMPL: 2.5 mV
MDC IDC MSMT LEADCHNL RA SENSING INTR AMPL: 2.5 mV
MDC IDC MSMT LEADCHNL RV IMPEDANCE VALUE: 342 Ohm
MDC IDC MSMT LEADCHNL RV PACING THRESHOLD AMPLITUDE: 2.375 V
MDC IDC MSMT LEADCHNL RV SENSING INTR AMPL: 6.625 mV
MDC IDC MSMT LEADCHNL RV SENSING INTR AMPL: 6.625 mV
MDC IDC SET LEADCHNL LV PACING AMPLITUDE: 2 V
MDC IDC SET LEADCHNL LV PACING PULSEWIDTH: 0.6 ms
MDC IDC STAT BRADY AP VP PERCENT: 0.1 %
MDC IDC STAT BRADY RA PERCENT PACED: 0.12 %
MDC IDC STAT BRADY RV PERCENT PACED: 0.11 %

## 2017-11-14 NOTE — Progress Notes (Signed)
Patient seen today in clinic with c/o of increased fatigue, weakness, low energy and disinterest in "doing things" since last visit 3/25. RV programed suboptimal a last vist with LV only pacing to preserve battery. RV thresholds today as follows. 4.5V@ 0.57ms, 3.5V@0 .56ms, and 3.25V@1ms . Patient reprogrammed today at 4V@0 .52ms. Recall with RU in ~83mo.

## 2017-11-14 NOTE — Telephone Encounter (Signed)
Mr. Mckibben calling because he has felt fatigues since CRTD reprogramming on 10/27/17. Scheduled today for 3pm to reprogram RV output.

## 2017-11-17 ENCOUNTER — Ambulatory Visit: Payer: Medicare Other

## 2017-11-17 ENCOUNTER — Other Ambulatory Visit: Payer: Self-pay | Admitting: Internal Medicine

## 2017-11-18 ENCOUNTER — Ambulatory Visit: Payer: Medicare Other

## 2017-11-21 DIAGNOSIS — E1129 Type 2 diabetes mellitus with other diabetic kidney complication: Secondary | ICD-10-CM | POA: Diagnosis not present

## 2017-11-21 DIAGNOSIS — N2581 Secondary hyperparathyroidism of renal origin: Secondary | ICD-10-CM | POA: Diagnosis not present

## 2017-11-21 DIAGNOSIS — N186 End stage renal disease: Secondary | ICD-10-CM | POA: Diagnosis not present

## 2017-11-21 DIAGNOSIS — D509 Iron deficiency anemia, unspecified: Secondary | ICD-10-CM | POA: Diagnosis not present

## 2017-11-24 DIAGNOSIS — D509 Iron deficiency anemia, unspecified: Secondary | ICD-10-CM | POA: Diagnosis not present

## 2017-11-24 DIAGNOSIS — N2581 Secondary hyperparathyroidism of renal origin: Secondary | ICD-10-CM | POA: Diagnosis not present

## 2017-11-24 DIAGNOSIS — E1129 Type 2 diabetes mellitus with other diabetic kidney complication: Secondary | ICD-10-CM | POA: Diagnosis not present

## 2017-11-24 DIAGNOSIS — N186 End stage renal disease: Secondary | ICD-10-CM | POA: Diagnosis not present

## 2017-11-26 ENCOUNTER — Telehealth: Payer: Self-pay | Admitting: *Deleted

## 2017-11-26 DIAGNOSIS — E1129 Type 2 diabetes mellitus with other diabetic kidney complication: Secondary | ICD-10-CM | POA: Diagnosis not present

## 2017-11-26 DIAGNOSIS — D509 Iron deficiency anemia, unspecified: Secondary | ICD-10-CM | POA: Diagnosis not present

## 2017-11-26 DIAGNOSIS — N2581 Secondary hyperparathyroidism of renal origin: Secondary | ICD-10-CM | POA: Diagnosis not present

## 2017-11-26 DIAGNOSIS — N186 End stage renal disease: Secondary | ICD-10-CM | POA: Diagnosis not present

## 2017-11-26 NOTE — Telephone Encounter (Signed)
Prefilled Syringes: # 150mg  4  #75mg  0 Arrival Date: 11/26/2017 Lot #: 150mg  [id number]      75mg  0 Exp Date: 150mg  07/2018   75mg  0  Prefilled Syringe: #150mg  4  #75mg  0 Ordered Date: 11/26/2017 Shipping Date: 11/26/2017

## 2017-11-27 ENCOUNTER — Ambulatory Visit (INDEPENDENT_AMBULATORY_CARE_PROVIDER_SITE_OTHER): Payer: Medicare Other | Admitting: *Deleted

## 2017-11-27 DIAGNOSIS — Z95810 Presence of automatic (implantable) cardiac defibrillator: Secondary | ICD-10-CM | POA: Diagnosis not present

## 2017-11-27 DIAGNOSIS — H5203 Hypermetropia, bilateral: Secondary | ICD-10-CM | POA: Diagnosis not present

## 2017-11-27 DIAGNOSIS — I5042 Chronic combined systolic (congestive) and diastolic (congestive) heart failure: Secondary | ICD-10-CM | POA: Diagnosis not present

## 2017-11-27 DIAGNOSIS — Z961 Presence of intraocular lens: Secondary | ICD-10-CM | POA: Diagnosis not present

## 2017-11-27 DIAGNOSIS — I255 Ischemic cardiomyopathy: Secondary | ICD-10-CM

## 2017-11-27 LAB — HM DIABETES EYE EXAM

## 2017-11-27 NOTE — Progress Notes (Signed)
EPIC Encounter for ICM Monitoring  Patient Name: Shane [name] is a 84 y.o. male Date: 11/27/2017 Primary Care Physican: [name], MD Primary Cardiologist:Nahser/Bensimhon Electrophysiologist: Caryl Comes Nephrologist: Deterding Weight:206 lbs  Bi-V Pacing:97.6%      Heart Failure questions reviewed, pt symptomatic with 7 lb weight gain in the last week and he has increased shortness of breath. He started dialysis on 11/21/2017 but does not think they are removing enough fluid since he is so short of breath.  He asked if I could send a message to Dr Deterding regarding device report results today and advised would do so.    Thoracic impedance abnormal suggesting fluid accumulation since 11/20/2017 with only 1 day at baseline on 11/23/2017.  Prescribed dosage:  He reported all diuretics have been stopped since he is on dialysis.    Potassium 10 mEq take20 mEq by mouth daily as needed (with each metolazone dose).  Labs: 09/26/2017 Creatinine 3.20, BUN 84, Potassium 3.6, Sodium 137, EGFR  09/12/2017 Creatinine 2.91, BUN 69, Potassium 3.6, Sodium 136, EGFR 19-22 08/28/2017 Creatinine3.13, BUN86, Potassium4.1, Sodium132, [id number] 08/27/2016 Creatinine3.43, BUN98, Potassium4.3, Sodium130, [id number]  08/26/2016 Creatinine3.29, BUN88, Potassium4.2, Sodium130, [id number]  08/25/2016 Creatinine3.13, BUN83, Potassium3.6, Sodium133, [id number]  08/24/2016 Creatinine2.69, BUN76, Potassium3.5, Sodium133, [id number]  08/23/2016 Creatinine2.78, BUN81, Potassium3.0, Sodium134, [id number]  08/22/2016 Creatinine2.58, BUN79, Potassium3.3, [id number], [id number] @ 8:32 PM  08/22/2016 Creatinine2.67, NOB09, Potassium2.6, [id number], [id number] @ 11:39 AM 06/12/2017 Creatinine 2.44, BUN 40, Potassium 3.6, Sodium 135, EGFR 23-27 06/05/2017 Creatinine 2.23, BUN 53, Potassium 3.7, Sodium 139, EGFR 26-30 05/13/2017 Creatinine 2.43, BUN 56, Potassium 3.8, Sodium  136, EGFR 23-27 05/06/2017 Creatinine 2.83, BUN 90, Potassium 3.7, Sodium 140, EGFR 19-23 Complete list of results can be viewed in results review.  Recommendations:  Advised will stop ICM after the next 1-2 months since any fluid is removed by dialysis. He feels like it may be beneficial to check fluid levels a couple of more times until the dialysis unit starts removing enough fluid.   Follow-up plan: ICM clinic phone appointment on 12/04/2017 to recheck fluid levels  Copy of ICM check sent to Dr. Caryl Comes and Dr. Haroldine Laws.   3 month ICM trend: 11/27/2017    1 Year ICM trend:       Rosalene Billings, RN 11/27/2017 1:12 PM

## 2017-11-28 ENCOUNTER — Encounter: Payer: Self-pay | Admitting: Cardiology

## 2017-11-28 DIAGNOSIS — E1129 Type 2 diabetes mellitus with other diabetic kidney complication: Secondary | ICD-10-CM | POA: Diagnosis not present

## 2017-11-28 DIAGNOSIS — N186 End stage renal disease: Secondary | ICD-10-CM | POA: Diagnosis not present

## 2017-11-28 DIAGNOSIS — N2581 Secondary hyperparathyroidism of renal origin: Secondary | ICD-10-CM | POA: Diagnosis not present

## 2017-11-28 DIAGNOSIS — D509 Iron deficiency anemia, unspecified: Secondary | ICD-10-CM | POA: Diagnosis not present

## 2017-11-28 NOTE — Progress Notes (Signed)
Remote ICD transmission.   

## 2017-12-01 DIAGNOSIS — N2581 Secondary hyperparathyroidism of renal origin: Secondary | ICD-10-CM | POA: Diagnosis not present

## 2017-12-01 DIAGNOSIS — D509 Iron deficiency anemia, unspecified: Secondary | ICD-10-CM | POA: Diagnosis not present

## 2017-12-01 DIAGNOSIS — E1129 Type 2 diabetes mellitus with other diabetic kidney complication: Secondary | ICD-10-CM | POA: Diagnosis not present

## 2017-12-01 DIAGNOSIS — N186 End stage renal disease: Secondary | ICD-10-CM | POA: Diagnosis not present

## 2017-12-02 ENCOUNTER — Ambulatory Visit (INDEPENDENT_AMBULATORY_CARE_PROVIDER_SITE_OTHER): Payer: Medicare Other

## 2017-12-02 DIAGNOSIS — J4540 Moderate persistent asthma, uncomplicated: Secondary | ICD-10-CM

## 2017-12-03 DIAGNOSIS — J4540 Moderate persistent asthma, uncomplicated: Secondary | ICD-10-CM | POA: Diagnosis not present

## 2017-12-03 DIAGNOSIS — N186 End stage renal disease: Secondary | ICD-10-CM | POA: Diagnosis not present

## 2017-12-03 DIAGNOSIS — E1129 Type 2 diabetes mellitus with other diabetic kidney complication: Secondary | ICD-10-CM | POA: Diagnosis not present

## 2017-12-03 DIAGNOSIS — D631 Anemia in chronic kidney disease: Secondary | ICD-10-CM | POA: Diagnosis not present

## 2017-12-03 DIAGNOSIS — Z992 Dependence on renal dialysis: Secondary | ICD-10-CM | POA: Diagnosis not present

## 2017-12-03 DIAGNOSIS — I129 Hypertensive chronic kidney disease with stage 1 through stage 4 chronic kidney disease, or unspecified chronic kidney disease: Secondary | ICD-10-CM | POA: Diagnosis not present

## 2017-12-03 DIAGNOSIS — N2581 Secondary hyperparathyroidism of renal origin: Secondary | ICD-10-CM | POA: Diagnosis not present

## 2017-12-03 DIAGNOSIS — D509 Iron deficiency anemia, unspecified: Secondary | ICD-10-CM | POA: Diagnosis not present

## 2017-12-03 DIAGNOSIS — Z23 Encounter for immunization: Secondary | ICD-10-CM | POA: Diagnosis not present

## 2017-12-03 MED ORDER — OMALIZUMAB 150 MG ~~LOC~~ SOLR
300.0000 mg | SUBCUTANEOUS | Status: DC
  Administered 2017-12-03: 300 mg via SUBCUTANEOUS

## 2017-12-03 NOTE — Progress Notes (Signed)
Documentation of medication administration and charges of Xolair have been completed by Andreea Arca, CMA based on the Xolair documentation sheet completed by Tammy Scott.  

## 2017-12-04 ENCOUNTER — Ambulatory Visit (INDEPENDENT_AMBULATORY_CARE_PROVIDER_SITE_OTHER): Payer: Self-pay

## 2017-12-04 ENCOUNTER — Other Ambulatory Visit: Payer: Self-pay | Admitting: Internal Medicine

## 2017-12-04 DIAGNOSIS — I5042 Chronic combined systolic (congestive) and diastolic (congestive) heart failure: Secondary | ICD-10-CM

## 2017-12-04 DIAGNOSIS — F5101 Primary insomnia: Secondary | ICD-10-CM

## 2017-12-04 DIAGNOSIS — Z95810 Presence of automatic (implantable) cardiac defibrillator: Secondary | ICD-10-CM

## 2017-12-04 NOTE — Telephone Encounter (Signed)
[name] Controlled Substance Database checked. Last filled on 11/05/17

## 2017-12-04 NOTE — Progress Notes (Signed)
EPIC Encounter for ICM Monitoring  Patient Name: Shane [name] is a 84 y.o. male Date: 12/04/2017 Primary Care Physican: [name], MD Primary Cardiologist:Nahser/Bensimhon Electrophysiologist: Caryl Comes Nephrologist: Deterding Weight:200lbs  Bi-V Pacing:97.7%       Heart Failure questions reviewed, pt feeling much better than last week. Patient is on hemodialysis 3 days at week.  Advised Dr Deterding sent message in regards to his concern about not taking enough fluid off.  Explained low BP limits the amount of volume they can remove but by adjusting the BP meds they will be able to progressively remove more fluid as needed.     Thoracic impedance normal.  Recommendations: No changes. Advised will check 1 more ICM remote transmission before disenrolling.  Advised it will not be beneficial to him to check remote transmission for fluid since he has dialysis.    Follow-up plan: ICM clinic phone appointment on 05//28/2019.  Office appointment scheduled 12/30/2017 with Dr. Acie Fredrickson.  Copy of ICM check sent to Dr. Caryl Comes.   3 month ICM trend: 12/04/2017    1 Year ICM trend:       Rosalene Billings, RN 12/04/2017 10:38 AM

## 2017-12-05 ENCOUNTER — Encounter: Payer: Self-pay | Admitting: Internal Medicine

## 2017-12-05 DIAGNOSIS — N186 End stage renal disease: Secondary | ICD-10-CM | POA: Diagnosis not present

## 2017-12-05 DIAGNOSIS — D509 Iron deficiency anemia, unspecified: Secondary | ICD-10-CM | POA: Diagnosis not present

## 2017-12-05 DIAGNOSIS — Z23 Encounter for immunization: Secondary | ICD-10-CM | POA: Diagnosis not present

## 2017-12-05 DIAGNOSIS — D631 Anemia in chronic kidney disease: Secondary | ICD-10-CM | POA: Diagnosis not present

## 2017-12-05 DIAGNOSIS — N2581 Secondary hyperparathyroidism of renal origin: Secondary | ICD-10-CM | POA: Diagnosis not present

## 2017-12-05 DIAGNOSIS — E1129 Type 2 diabetes mellitus with other diabetic kidney complication: Secondary | ICD-10-CM | POA: Diagnosis not present

## 2017-12-08 DIAGNOSIS — E1129 Type 2 diabetes mellitus with other diabetic kidney complication: Secondary | ICD-10-CM | POA: Diagnosis not present

## 2017-12-08 DIAGNOSIS — N186 End stage renal disease: Secondary | ICD-10-CM | POA: Diagnosis not present

## 2017-12-08 DIAGNOSIS — Z23 Encounter for immunization: Secondary | ICD-10-CM | POA: Diagnosis not present

## 2017-12-08 DIAGNOSIS — D631 Anemia in chronic kidney disease: Secondary | ICD-10-CM | POA: Diagnosis not present

## 2017-12-08 DIAGNOSIS — D509 Iron deficiency anemia, unspecified: Secondary | ICD-10-CM | POA: Diagnosis not present

## 2017-12-08 DIAGNOSIS — N2581 Secondary hyperparathyroidism of renal origin: Secondary | ICD-10-CM | POA: Diagnosis not present

## 2017-12-10 DIAGNOSIS — D631 Anemia in chronic kidney disease: Secondary | ICD-10-CM | POA: Diagnosis not present

## 2017-12-10 DIAGNOSIS — E1129 Type 2 diabetes mellitus with other diabetic kidney complication: Secondary | ICD-10-CM | POA: Diagnosis not present

## 2017-12-10 DIAGNOSIS — D509 Iron deficiency anemia, unspecified: Secondary | ICD-10-CM | POA: Diagnosis not present

## 2017-12-10 DIAGNOSIS — Z23 Encounter for immunization: Secondary | ICD-10-CM | POA: Diagnosis not present

## 2017-12-10 DIAGNOSIS — N2581 Secondary hyperparathyroidism of renal origin: Secondary | ICD-10-CM | POA: Diagnosis not present

## 2017-12-10 DIAGNOSIS — N186 End stage renal disease: Secondary | ICD-10-CM | POA: Diagnosis not present

## 2017-12-11 ENCOUNTER — Ambulatory Visit (INDEPENDENT_AMBULATORY_CARE_PROVIDER_SITE_OTHER): Payer: Medicare Other | Admitting: Podiatry

## 2017-12-11 ENCOUNTER — Encounter: Payer: Self-pay | Admitting: Podiatry

## 2017-12-11 DIAGNOSIS — B351 Tinea unguium: Secondary | ICD-10-CM

## 2017-12-11 DIAGNOSIS — E1151 Type 2 diabetes mellitus with diabetic peripheral angiopathy without gangrene: Secondary | ICD-10-CM | POA: Diagnosis not present

## 2017-12-12 DIAGNOSIS — E1129 Type 2 diabetes mellitus with other diabetic kidney complication: Secondary | ICD-10-CM | POA: Diagnosis not present

## 2017-12-12 DIAGNOSIS — D631 Anemia in chronic kidney disease: Secondary | ICD-10-CM | POA: Diagnosis not present

## 2017-12-12 DIAGNOSIS — N2581 Secondary hyperparathyroidism of renal origin: Secondary | ICD-10-CM | POA: Diagnosis not present

## 2017-12-12 DIAGNOSIS — Z23 Encounter for immunization: Secondary | ICD-10-CM | POA: Diagnosis not present

## 2017-12-12 DIAGNOSIS — N186 End stage renal disease: Secondary | ICD-10-CM | POA: Diagnosis not present

## 2017-12-12 DIAGNOSIS — D509 Iron deficiency anemia, unspecified: Secondary | ICD-10-CM | POA: Diagnosis not present

## 2017-12-15 ENCOUNTER — Inpatient Hospital Stay (HOSPITAL_BASED_OUTPATIENT_CLINIC_OR_DEPARTMENT_OTHER)
Admission: EM | Admit: 2017-12-15 | Discharge: 2017-12-19 | DRG: 553 | Disposition: A | Discharge status: Home Health | Payer: Medicare Other | Attending: Internal Medicine | Admitting: Internal Medicine

## 2017-12-15 ENCOUNTER — Encounter (HOSPITAL_BASED_OUTPATIENT_CLINIC_OR_DEPARTMENT_OTHER): Payer: Self-pay

## 2017-12-15 ENCOUNTER — Other Ambulatory Visit: Payer: Self-pay

## 2017-12-15 ENCOUNTER — Other Ambulatory Visit (HOSPITAL_COMMUNITY)
Admission: RE | Admit: 2017-12-15 | Discharge: 2017-12-15 | Disposition: A | Discharge status: Home / Self Care | Payer: Medicare Other | Source: Other Acute Inpatient Hospital | Attending: Orthopedic Surgery | Admitting: Orthopedic Surgery

## 2017-12-15 DIAGNOSIS — N2581 Secondary hyperparathyroidism of renal origin: Secondary | ICD-10-CM | POA: Diagnosis not present

## 2017-12-15 DIAGNOSIS — M25462 Effusion, left knee: Secondary | ICD-10-CM | POA: Diagnosis not present

## 2017-12-15 DIAGNOSIS — M898X9 Other specified disorders of bone, unspecified site: Secondary | ICD-10-CM | POA: Diagnosis present

## 2017-12-15 DIAGNOSIS — I255 Ischemic cardiomyopathy: Secondary | ICD-10-CM | POA: Diagnosis present

## 2017-12-15 DIAGNOSIS — Z7982 Long term (current) use of aspirin: Secondary | ICD-10-CM

## 2017-12-15 DIAGNOSIS — N400 Enlarged prostate without lower urinary tract symptoms: Secondary | ICD-10-CM | POA: Diagnosis present

## 2017-12-15 DIAGNOSIS — I252 Old myocardial infarction: Secondary | ICD-10-CM

## 2017-12-15 DIAGNOSIS — Z87891 Personal history of nicotine dependence: Secondary | ICD-10-CM

## 2017-12-15 DIAGNOSIS — R509 Fever, unspecified: Secondary | ICD-10-CM

## 2017-12-15 DIAGNOSIS — J4540 Moderate persistent asthma, uncomplicated: Secondary | ICD-10-CM | POA: Diagnosis present

## 2017-12-15 DIAGNOSIS — E785 Hyperlipidemia, unspecified: Secondary | ICD-10-CM | POA: Diagnosis present

## 2017-12-15 DIAGNOSIS — M25562 Pain in left knee: Secondary | ICD-10-CM | POA: Diagnosis not present

## 2017-12-15 DIAGNOSIS — I2510 Atherosclerotic heart disease of native coronary artery without angina pectoris: Secondary | ICD-10-CM | POA: Diagnosis present

## 2017-12-15 DIAGNOSIS — I5022 Chronic systolic (congestive) heart failure: Secondary | ICD-10-CM | POA: Diagnosis not present

## 2017-12-15 DIAGNOSIS — M1712 Unilateral primary osteoarthritis, left knee: Secondary | ICD-10-CM | POA: Diagnosis not present

## 2017-12-15 DIAGNOSIS — I472 Ventricular tachycardia, unspecified: Secondary | ICD-10-CM

## 2017-12-15 DIAGNOSIS — Z881 Allergy status to other antibiotic agents status: Secondary | ICD-10-CM

## 2017-12-15 DIAGNOSIS — I447 Left bundle-branch block, unspecified: Secondary | ICD-10-CM | POA: Diagnosis present

## 2017-12-15 DIAGNOSIS — M1990 Unspecified osteoarthritis, unspecified site: Secondary | ICD-10-CM | POA: Diagnosis present

## 2017-12-15 DIAGNOSIS — J309 Allergic rhinitis, unspecified: Secondary | ICD-10-CM | POA: Diagnosis present

## 2017-12-15 DIAGNOSIS — N184 Chronic kidney disease, stage 4 (severe): Secondary | ICD-10-CM | POA: Diagnosis present

## 2017-12-15 DIAGNOSIS — E1142 Type 2 diabetes mellitus with diabetic polyneuropathy: Secondary | ICD-10-CM | POA: Diagnosis present

## 2017-12-15 DIAGNOSIS — Z86718 Personal history of other venous thrombosis and embolism: Secondary | ICD-10-CM

## 2017-12-15 DIAGNOSIS — I10 Essential (primary) hypertension: Secondary | ICD-10-CM | POA: Diagnosis present

## 2017-12-15 DIAGNOSIS — M1A9XX0 Chronic gout, unspecified, without tophus (tophi): Principal | ICD-10-CM | POA: Diagnosis present

## 2017-12-15 DIAGNOSIS — M79673 Pain in unspecified foot: Secondary | ICD-10-CM

## 2017-12-15 DIAGNOSIS — E876 Hypokalemia: Secondary | ICD-10-CM | POA: Diagnosis present

## 2017-12-15 DIAGNOSIS — I132 Hypertensive heart and chronic kidney disease with heart failure and with stage 5 chronic kidney disease, or end stage renal disease: Secondary | ICD-10-CM | POA: Diagnosis present

## 2017-12-15 DIAGNOSIS — N186 End stage renal disease: Secondary | ICD-10-CM | POA: Diagnosis present

## 2017-12-15 DIAGNOSIS — Z23 Encounter for immunization: Secondary | ICD-10-CM | POA: Diagnosis not present

## 2017-12-15 DIAGNOSIS — M4800 Spinal stenosis, site unspecified: Secondary | ICD-10-CM | POA: Diagnosis present

## 2017-12-15 DIAGNOSIS — Z888 Allergy status to other drugs, medicaments and biological substances status: Secondary | ICD-10-CM

## 2017-12-15 DIAGNOSIS — M1A0620 Idiopathic chronic gout, left knee, without tophus (tophi): Secondary | ICD-10-CM

## 2017-12-15 DIAGNOSIS — D631 Anemia in chronic kidney disease: Secondary | ICD-10-CM | POA: Diagnosis not present

## 2017-12-15 DIAGNOSIS — Z7951 Long term (current) use of inhaled steroids: Secondary | ICD-10-CM

## 2017-12-15 DIAGNOSIS — Z9849 Cataract extraction status, unspecified eye: Secondary | ICD-10-CM

## 2017-12-15 DIAGNOSIS — Z833 Family history of diabetes mellitus: Secondary | ICD-10-CM

## 2017-12-15 DIAGNOSIS — N390 Urinary tract infection, site not specified: Secondary | ICD-10-CM | POA: Diagnosis not present

## 2017-12-15 DIAGNOSIS — Z96659 Presence of unspecified artificial knee joint: Secondary | ICD-10-CM | POA: Diagnosis present

## 2017-12-15 DIAGNOSIS — H9190 Unspecified hearing loss, unspecified ear: Secondary | ICD-10-CM | POA: Diagnosis present

## 2017-12-15 DIAGNOSIS — Z95810 Presence of automatic (implantable) cardiac defibrillator: Secondary | ICD-10-CM

## 2017-12-15 DIAGNOSIS — L03031 Cellulitis of right toe: Secondary | ICD-10-CM | POA: Diagnosis present

## 2017-12-15 DIAGNOSIS — G629 Polyneuropathy, unspecified: Secondary | ICD-10-CM

## 2017-12-15 DIAGNOSIS — E1129 Type 2 diabetes mellitus with other diabetic kidney complication: Secondary | ICD-10-CM | POA: Diagnosis not present

## 2017-12-15 DIAGNOSIS — H409 Unspecified glaucoma: Secondary | ICD-10-CM | POA: Diagnosis present

## 2017-12-15 DIAGNOSIS — G4733 Obstructive sleep apnea (adult) (pediatric): Secondary | ICD-10-CM | POA: Diagnosis present

## 2017-12-15 DIAGNOSIS — Z992 Dependence on renal dialysis: Secondary | ICD-10-CM

## 2017-12-15 DIAGNOSIS — J984 Other disorders of lung: Secondary | ICD-10-CM

## 2017-12-15 DIAGNOSIS — Z4502 Encounter for adjustment and management of automatic implantable cardiac defibrillator: Secondary | ICD-10-CM

## 2017-12-15 DIAGNOSIS — Z801 Family history of malignant neoplasm of trachea, bronchus and lung: Secondary | ICD-10-CM

## 2017-12-15 DIAGNOSIS — D509 Iron deficiency anemia, unspecified: Secondary | ICD-10-CM | POA: Diagnosis not present

## 2017-12-15 DIAGNOSIS — Z8371 Family history of colonic polyps: Secondary | ICD-10-CM

## 2017-12-15 DIAGNOSIS — M109 Gout, unspecified: Secondary | ICD-10-CM | POA: Diagnosis present

## 2017-12-15 DIAGNOSIS — E1121 Type 2 diabetes mellitus with diabetic nephropathy: Secondary | ICD-10-CM | POA: Diagnosis present

## 2017-12-15 DIAGNOSIS — Z951 Presence of aortocoronary bypass graft: Secondary | ICD-10-CM

## 2017-12-15 DIAGNOSIS — Z8249 Family history of ischemic heart disease and other diseases of the circulatory system: Secondary | ICD-10-CM

## 2017-12-15 DIAGNOSIS — I5042 Chronic combined systolic (congestive) and diastolic (congestive) heart failure: Secondary | ICD-10-CM | POA: Diagnosis present

## 2017-12-15 DIAGNOSIS — E1122 Type 2 diabetes mellitus with diabetic chronic kidney disease: Secondary | ICD-10-CM | POA: Diagnosis present

## 2017-12-15 DIAGNOSIS — Z885 Allergy status to narcotic agent status: Secondary | ICD-10-CM

## 2017-12-15 DIAGNOSIS — E7800 Pure hypercholesterolemia, unspecified: Secondary | ICD-10-CM | POA: Diagnosis present

## 2017-12-15 DIAGNOSIS — Z86010 Personal history of colonic polyps: Secondary | ICD-10-CM

## 2017-12-15 HISTORY — DX: Dependence on renal dialysis: Z99.2

## 2017-12-15 NOTE — ED Triage Notes (Signed)
Pt c/o swelling to left knee, and bilat feet/ankles since lat Friday-was seen by ortho today-had fluid drawn from knee with gout dx-pt states ortho felt LE c/o may also be gout but waiting to see lab results-pt presents to triage in w/c-NAD-family added pt started running fever today with temp 101 ax-pt had taken tylenol for pain prior to taking temp-pt new dialysis pt x 3 weeks and did get dialysis today

## 2017-12-16 ENCOUNTER — Inpatient Hospital Stay (HOSPITAL_COMMUNITY): Payer: Medicare Other

## 2017-12-16 ENCOUNTER — Emergency Department (HOSPITAL_BASED_OUTPATIENT_CLINIC_OR_DEPARTMENT_OTHER): Payer: Medicare Other

## 2017-12-16 ENCOUNTER — Encounter (HOSPITAL_BASED_OUTPATIENT_CLINIC_OR_DEPARTMENT_OTHER): Payer: Self-pay | Admitting: Emergency Medicine

## 2017-12-16 ENCOUNTER — Ambulatory Visit: Payer: Medicare Other

## 2017-12-16 DIAGNOSIS — E1121 Type 2 diabetes mellitus with diabetic nephropathy: Secondary | ICD-10-CM | POA: Diagnosis present

## 2017-12-16 DIAGNOSIS — Z992 Dependence on renal dialysis: Secondary | ICD-10-CM | POA: Diagnosis not present

## 2017-12-16 DIAGNOSIS — I5020 Unspecified systolic (congestive) heart failure: Secondary | ICD-10-CM | POA: Diagnosis not present

## 2017-12-16 DIAGNOSIS — N184 Chronic kidney disease, stage 4 (severe): Secondary | ICD-10-CM | POA: Diagnosis not present

## 2017-12-16 DIAGNOSIS — N2581 Secondary hyperparathyroidism of renal origin: Secondary | ICD-10-CM | POA: Diagnosis present

## 2017-12-16 DIAGNOSIS — M19071 Primary osteoarthritis, right ankle and foot: Secondary | ICD-10-CM | POA: Diagnosis not present

## 2017-12-16 DIAGNOSIS — E785 Hyperlipidemia, unspecified: Secondary | ICD-10-CM | POA: Diagnosis present

## 2017-12-16 DIAGNOSIS — I252 Old myocardial infarction: Secondary | ICD-10-CM | POA: Diagnosis not present

## 2017-12-16 DIAGNOSIS — Z95810 Presence of automatic (implantable) cardiac defibrillator: Secondary | ICD-10-CM | POA: Diagnosis not present

## 2017-12-16 DIAGNOSIS — M1A9XX0 Chronic gout, unspecified, without tophus (tophi): Secondary | ICD-10-CM | POA: Diagnosis present

## 2017-12-16 DIAGNOSIS — I132 Hypertensive heart and chronic kidney disease with heart failure and with stage 5 chronic kidney disease, or end stage renal disease: Secondary | ICD-10-CM | POA: Diagnosis present

## 2017-12-16 DIAGNOSIS — J309 Allergic rhinitis, unspecified: Secondary | ICD-10-CM | POA: Diagnosis present

## 2017-12-16 DIAGNOSIS — I2510 Atherosclerotic heart disease of native coronary artery without angina pectoris: Secondary | ICD-10-CM | POA: Diagnosis present

## 2017-12-16 DIAGNOSIS — J4540 Moderate persistent asthma, uncomplicated: Secondary | ICD-10-CM | POA: Diagnosis not present

## 2017-12-16 DIAGNOSIS — M25462 Effusion, left knee: Secondary | ICD-10-CM | POA: Diagnosis not present

## 2017-12-16 DIAGNOSIS — N400 Enlarged prostate without lower urinary tract symptoms: Secondary | ICD-10-CM | POA: Diagnosis not present

## 2017-12-16 DIAGNOSIS — E876 Hypokalemia: Secondary | ICD-10-CM | POA: Diagnosis not present

## 2017-12-16 DIAGNOSIS — D631 Anemia in chronic kidney disease: Secondary | ICD-10-CM | POA: Diagnosis not present

## 2017-12-16 DIAGNOSIS — I447 Left bundle-branch block, unspecified: Secondary | ICD-10-CM | POA: Diagnosis present

## 2017-12-16 DIAGNOSIS — G4733 Obstructive sleep apnea (adult) (pediatric): Secondary | ICD-10-CM | POA: Diagnosis present

## 2017-12-16 DIAGNOSIS — I25119 Atherosclerotic heart disease of native coronary artery with unspecified angina pectoris: Secondary | ICD-10-CM | POA: Diagnosis not present

## 2017-12-16 DIAGNOSIS — E1142 Type 2 diabetes mellitus with diabetic polyneuropathy: Secondary | ICD-10-CM | POA: Diagnosis present

## 2017-12-16 DIAGNOSIS — Z4502 Encounter for adjustment and management of automatic implantable cardiac defibrillator: Secondary | ICD-10-CM | POA: Diagnosis not present

## 2017-12-16 DIAGNOSIS — I5042 Chronic combined systolic (congestive) and diastolic (congestive) heart failure: Secondary | ICD-10-CM | POA: Diagnosis not present

## 2017-12-16 DIAGNOSIS — I10 Essential (primary) hypertension: Secondary | ICD-10-CM | POA: Diagnosis not present

## 2017-12-16 DIAGNOSIS — I472 Ventricular tachycardia: Secondary | ICD-10-CM | POA: Diagnosis present

## 2017-12-16 DIAGNOSIS — E1122 Type 2 diabetes mellitus with diabetic chronic kidney disease: Secondary | ICD-10-CM | POA: Diagnosis not present

## 2017-12-16 DIAGNOSIS — Z8371 Family history of colonic polyps: Secondary | ICD-10-CM | POA: Diagnosis not present

## 2017-12-16 DIAGNOSIS — I255 Ischemic cardiomyopathy: Secondary | ICD-10-CM | POA: Diagnosis not present

## 2017-12-16 DIAGNOSIS — Z86010 Personal history of colonic polyps: Secondary | ICD-10-CM | POA: Diagnosis not present

## 2017-12-16 DIAGNOSIS — H409 Unspecified glaucoma: Secondary | ICD-10-CM | POA: Diagnosis present

## 2017-12-16 DIAGNOSIS — R509 Fever, unspecified: Secondary | ICD-10-CM | POA: Diagnosis not present

## 2017-12-16 DIAGNOSIS — M10362 Gout due to renal impairment, left knee: Secondary | ICD-10-CM | POA: Diagnosis not present

## 2017-12-16 DIAGNOSIS — N186 End stage renal disease: Secondary | ICD-10-CM | POA: Diagnosis present

## 2017-12-16 DIAGNOSIS — Z833 Family history of diabetes mellitus: Secondary | ICD-10-CM | POA: Diagnosis not present

## 2017-12-16 DIAGNOSIS — L03031 Cellulitis of right toe: Secondary | ICD-10-CM | POA: Diagnosis present

## 2017-12-16 DIAGNOSIS — E7800 Pure hypercholesterolemia, unspecified: Secondary | ICD-10-CM | POA: Diagnosis present

## 2017-12-16 DIAGNOSIS — I5022 Chronic systolic (congestive) heart failure: Secondary | ICD-10-CM | POA: Diagnosis present

## 2017-12-16 DIAGNOSIS — M109 Gout, unspecified: Secondary | ICD-10-CM | POA: Diagnosis present

## 2017-12-16 DIAGNOSIS — Z951 Presence of aortocoronary bypass graft: Secondary | ICD-10-CM | POA: Diagnosis not present

## 2017-12-16 DIAGNOSIS — M1990 Unspecified osteoarthritis, unspecified site: Secondary | ICD-10-CM | POA: Diagnosis present

## 2017-12-16 LAB — SEDIMENTATION RATE: Sed Rate: 100 mm/hr — ABNORMAL HIGH (ref 0–16)

## 2017-12-16 LAB — CBC WITH DIFFERENTIAL/PLATELET
BASOS ABS: 0 10*3/uL (ref 0.0–0.1)
Basophils Relative: 0 %
EOS ABS: 0.1 10*3/uL (ref 0.0–0.7)
Eosinophils Relative: 1 %
HCT: 34.1 % — ABNORMAL LOW (ref 39.0–52.0)
Hemoglobin: 11 g/dL — ABNORMAL LOW (ref 13.0–17.0)
LYMPHS ABS: 1.2 10*3/uL (ref 0.7–4.0)
Lymphocytes Relative: 13 %
MCH: 29.9 pg (ref 26.0–34.0)
MCHC: 32.3 g/dL (ref 30.0–36.0)
MCV: 92.7 fL (ref 78.0–100.0)
Monocytes Absolute: 1.1 10*3/uL (ref 0.1–1.0)
Monocytes Relative: 12 %
NEUTROS PCT: 74 %
Neutro Abs: 6.6 10*3/uL (ref 1.7–7.7)
PLATELETS: ADEQUATE 10*3/uL (ref 150–400)
RBC: 3.68 MIL/uL — AB (ref 4.22–5.81)
RDW: 14.9 % (ref 11.5–15.5)
WBC: 8.9 10*3/uL (ref 4.0–10.5)

## 2017-12-16 LAB — URINALYSIS, MICROSCOPIC (REFLEX)

## 2017-12-16 LAB — BASIC METABOLIC PANEL
Anion gap: 10 (ref 5–15)
BUN: 31 mg/dL — ABNORMAL HIGH (ref 6–20)
CHLORIDE: 98 mmol/L — AB (ref 101–111)
CO2: 26 mmol/L (ref 22–32)
Calcium: 7.8 mg/dL — ABNORMAL LOW (ref 8.9–10.3)
Creatinine, Ser: 2.6 mg/dL — ABNORMAL HIGH (ref 0.61–1.24)
GFR calc Af Amer: 25 mL/min — ABNORMAL LOW (ref >60)
GFR calc non Af Amer: 21 mL/min — ABNORMAL LOW (ref >60)
GLUCOSE: 141 mg/dL — AB (ref 65–99)
POTASSIUM: 3.3 mmol/L — AB (ref 3.5–5.1)
Sodium: 134 mmol/L — ABNORMAL LOW (ref 135–145)

## 2017-12-16 LAB — URINALYSIS, ROUTINE W REFLEX MICROSCOPIC
GLUCOSE, UA: NEGATIVE mg/dL
Ketones, ur: NEGATIVE mg/dL
LEUKOCYTES UA: NEGATIVE
NITRITE: NEGATIVE
Protein, ur: >300 mg/dL — AB
Specific Gravity, Urine: 1.025 (ref 1.005–1.030)
pH: 6 (ref 5.0–8.0)

## 2017-12-16 LAB — GLUCOSE, CAPILLARY
GLUCOSE-CAPILLARY: 130 mg/dL — AB (ref 65–99)
Glucose-Capillary: 194 mg/dL — ABNORMAL HIGH (ref 65–99)
Glucose-Capillary: 176 mg/dL — ABNORMAL HIGH (ref 65–99)
Glucose-Capillary: 179 mg/dL — ABNORMAL HIGH (ref 65–99)

## 2017-12-16 LAB — URIC ACID: Uric Acid, Serum: 2.5 mg/dL — ABNORMAL LOW (ref 4.4–7.6)

## 2017-12-16 LAB — C-REACTIVE PROTEIN: CRP: 18.5 mg/dL — ABNORMAL HIGH (ref <1.0)

## 2017-12-16 LAB — PROTIME-INR
INR: 1.2
Prothrombin Time: 15.1 s (ref 11.4–15.2)

## 2017-12-16 LAB — BRAIN NATRIURETIC PEPTIDE: B Natriuretic Peptide: 681.1 pg/mL — ABNORMAL HIGH (ref 0.0–100.0)

## 2017-12-16 LAB — I-STAT CG4 LACTIC ACID, ED: Lactic Acid, Venous: 1.14 mmol/L (ref 0.5–1.9)

## 2017-12-16 MED ORDER — ASPIRIN EC 81 MG PO TBEC
81.0000 mg | DELAYED_RELEASE_TABLET | Freq: Every day | ORAL | Status: DC
  Administered 2017-12-16 – 2017-12-19 (×4): 81 mg via ORAL
  Filled 2017-12-16 (×4): qty 1

## 2017-12-16 MED ORDER — ALBUTEROL SULFATE (2.5 MG/3ML) 0.083% IN NEBU: 2.5000 mg | INHALATION_SOLUTION | RESPIRATORY_TRACT | Status: DC | PRN

## 2017-12-16 MED ORDER — HEPARIN SODIUM (PORCINE) 5000 UNIT/ML IJ SOLN
5000.0000 [IU] | Freq: Three times a day (TID) | INTRAMUSCULAR | Status: DC
  Administered 2017-12-16 – 2017-12-19 (×8): 5000 [IU] via SUBCUTANEOUS
  Filled 2017-12-16 (×8): qty 1

## 2017-12-16 MED ORDER — ZOLPIDEM TARTRATE 5 MG PO TABS
5.0000 mg | ORAL_TABLET | Freq: Every evening | ORAL | Status: DC | PRN
  Administered 2017-12-17: 5 mg via ORAL
  Filled 2017-12-16: qty 1

## 2017-12-16 MED ORDER — AMLODIPINE BESYLATE 5 MG PO TABS
5.0000 mg | ORAL_TABLET | Freq: Every day | ORAL | Status: DC
  Administered 2017-12-16 – 2017-12-19 (×2): 5 mg via ORAL
  Filled 2017-12-16 (×3): qty 1

## 2017-12-16 MED ORDER — ACETAMINOPHEN 500 MG PO TABS
1000.0000 mg | ORAL_TABLET | Freq: Three times a day (TID) | ORAL | Status: DC | PRN
  Administered 2017-12-16 – 2017-12-18 (×3): 1000 mg via ORAL
  Filled 2017-12-16 (×3): qty 2

## 2017-12-16 MED ORDER — ONDANSETRON HCL 4 MG/2ML IJ SOLN: 4.0000 mg | Freq: Four times a day (QID) | INTRAMUSCULAR | Status: DC | PRN

## 2017-12-16 MED ORDER — ACETAMINOPHEN 500 MG PO TABS
1000.0000 mg | ORAL_TABLET | Freq: Once | ORAL | Status: AC
  Administered 2017-12-16: 1000 mg via ORAL
  Filled 2017-12-16: qty 2

## 2017-12-16 MED ORDER — CALCITRIOL 0.25 MCG PO CAPS
0.2500 ug | ORAL_CAPSULE | ORAL | Status: DC
  Administered 2017-12-17 – 2017-12-19 (×2): 0.25 ug via ORAL

## 2017-12-16 MED ORDER — ONDANSETRON HCL 4 MG PO TABS
4.0000 mg | ORAL_TABLET | Freq: Four times a day (QID) | ORAL | Status: DC | PRN
  Administered 2017-12-16: 4 mg via ORAL
  Filled 2017-12-16: qty 1

## 2017-12-16 MED ORDER — VANCOMYCIN HCL IN DEXTROSE 1-5 GM/200ML-% IV SOLN
1000.0000 mg | Freq: Once | INTRAVENOUS | Status: AC
  Administered 2017-12-16: 1000 mg via INTRAVENOUS
  Filled 2017-12-16: qty 200

## 2017-12-16 MED ORDER — SODIUM CHLORIDE 0.9% FLUSH: 3.0000 mL | INTRAVENOUS | Status: DC | PRN

## 2017-12-16 MED ORDER — ATORVASTATIN CALCIUM 10 MG PO TABS
10.0000 mg | ORAL_TABLET | Freq: Every day | ORAL | Status: DC
  Administered 2017-12-16 – 2017-12-19 (×4): 10 mg via ORAL
  Filled 2017-12-16 (×4): qty 1

## 2017-12-16 MED ORDER — PIPERACILLIN-TAZOBACTAM 3.375 G IVPB 30 MIN
3.3750 g | Freq: Once | INTRAVENOUS | Status: AC
  Administered 2017-12-16: 3.375 g via INTRAVENOUS
  Filled 2017-12-16 (×2): qty 50

## 2017-12-16 MED ORDER — PIPERACILLIN-TAZOBACTAM 3.375 G IVPB
3.3750 g | Freq: Two times a day (BID) | INTRAVENOUS | Status: DC
  Administered 2017-12-16 – 2017-12-17 (×2): 3.375 g via INTRAVENOUS
  Filled 2017-12-16 (×3): qty 50

## 2017-12-16 MED ORDER — FLUOCINONIDE 0.05 % EX CREA: 1.0000 "application " | TOPICAL_CREAM | Freq: Two times a day (BID) | CUTANEOUS | Status: DC | PRN

## 2017-12-16 MED ORDER — BUPIVACAINE HCL (PF) 0.5 % IJ SOLN
10.0000 mL | Freq: Once | INTRAMUSCULAR | Status: DC
  Filled 2017-12-16: qty 10

## 2017-12-16 MED ORDER — AMIODARONE HCL 200 MG PO TABS
200.0000 mg | ORAL_TABLET | Freq: Every day | ORAL | Status: DC
  Administered 2017-12-16 – 2017-12-19 (×4): 200 mg via ORAL
  Filled 2017-12-16 (×4): qty 1

## 2017-12-16 MED ORDER — SODIUM CHLORIDE 0.9% FLUSH
3.0000 mL | Freq: Two times a day (BID) | INTRAVENOUS | Status: DC
  Administered 2017-12-17 – 2017-12-18 (×4): 3 mL via INTRAVENOUS

## 2017-12-16 MED ORDER — POTASSIUM CHLORIDE 20 MEQ/15ML (10%) PO SOLN: 20.0000 meq | Freq: Once | ORAL | Status: DC

## 2017-12-16 MED ORDER — OXYCODONE HCL 5 MG PO TABS: 5.0000 mg | ORAL_TABLET | ORAL | Status: DC | PRN

## 2017-12-16 MED ORDER — CARVEDILOL 6.25 MG PO TABS
6.2500 mg | ORAL_TABLET | Freq: Two times a day (BID) | ORAL | Status: DC
  Administered 2017-12-16 – 2017-12-18 (×5): 6.25 mg via ORAL
  Filled 2017-12-16 (×5): qty 1

## 2017-12-16 MED ORDER — TRIAMCINOLONE ACETONIDE 55 MCG/ACT NA AERO: 2.0000 | INHALATION_SPRAY | Freq: Every day | NASAL | Status: DC | PRN

## 2017-12-16 MED ORDER — METHYLPREDNISOLONE SODIUM SUCC 40 MG IJ SOLR
40.0000 mg | Freq: Once | INTRAMUSCULAR | Status: DC
  Filled 2017-12-16: qty 1

## 2017-12-16 MED ORDER — PIPERACILLIN-TAZOBACTAM 3.375 G IVPB
3.3750 g | Freq: Three times a day (TID) | INTRAVENOUS | Status: DC
  Administered 2017-12-16: 3.375 g via INTRAVENOUS
  Filled 2017-12-16 (×2): qty 50

## 2017-12-16 MED ORDER — FEBUXOSTAT 40 MG PO TABS
40.0000 mg | ORAL_TABLET | Freq: Every day | ORAL | Status: DC
  Administered 2017-12-16 – 2017-12-19 (×4): 40 mg via ORAL
  Filled 2017-12-16 (×4): qty 1

## 2017-12-16 MED ORDER — VANCOMYCIN HCL IN DEXTROSE 1-5 GM/200ML-% IV SOLN: 1000.0000 mg | INTRAVENOUS | Status: DC

## 2017-12-16 MED ORDER — DM-GUAIFENESIN ER 30-600 MG PO TB12: 1.0000 | ORAL_TABLET | Freq: Two times a day (BID) | ORAL | Status: DC | PRN

## 2017-12-16 MED ORDER — HYDRALAZINE HCL 50 MG PO TABS
50.0000 mg | ORAL_TABLET | Freq: Three times a day (TID) | ORAL | Status: DC
  Administered 2017-12-16: 50 mg via ORAL
  Filled 2017-12-16 (×7): qty 1

## 2017-12-16 MED ORDER — LORATADINE 10 MG PO TABS
10.0000 mg | ORAL_TABLET | Freq: Every day | ORAL | Status: DC
  Administered 2017-12-16 – 2017-12-19 (×4): 10 mg via ORAL
  Filled 2017-12-16 (×4): qty 1

## 2017-12-16 MED ORDER — COLCHICINE 0.6 MG PO TABS: 0.6000 mg | ORAL_TABLET | Freq: Once | ORAL | Status: DC | PRN

## 2017-12-16 MED ORDER — VANCOMYCIN HCL IN DEXTROSE 1-5 GM/200ML-% IV SOLN
1000.0000 mg | INTRAVENOUS | Status: DC
  Filled 2017-12-16: qty 200

## 2017-12-16 MED ORDER — METHYLPREDNISOLONE ACETATE 40 MG/ML IJ SUSP
40.0000 mg | Freq: Once | INTRAMUSCULAR | Status: AC
  Administered 2017-12-16: 40 mg via INTRA_ARTICULAR
  Filled 2017-12-16: qty 1

## 2017-12-16 MED ORDER — SODIUM CHLORIDE 0.9 % IV SOLN: 250.0000 mL | INTRAVENOUS | Status: DC | PRN

## 2017-12-16 MED ORDER — COLCHICINE 0.6 MG PO TABS
0.3000 mg | ORAL_TABLET | Freq: Every day | ORAL | Status: DC
  Administered 2017-12-16: 0.3 mg via ORAL
  Filled 2017-12-16: qty 0.5

## 2017-12-16 MED ORDER — POLYETHYLENE GLYCOL 3350 17 G PO PACK
17.0000 g | PACK | Freq: Every day | ORAL | Status: DC | PRN
  Administered 2017-12-18: 17 g via ORAL
  Filled 2017-12-16: qty 1

## 2017-12-16 NOTE — Plan of Care (Signed)
  Problem: Nutrition: Goal: Adequate nutrition will be maintained Outcome: Adequate for Discharge   Problem: Coping: Goal: Level of anxiety will decrease Outcome: Completed/Met

## 2017-12-16 NOTE — Consult Note (Addendum)
[facility] Renal Consultation Note    Indication for Consultation:  Management of ESRD/hemodialysis; anemia, hypertension/volume and secondary hyperparathyroidism PCP:  HPI: Shane [name] is a 84 y.o. male on hemodialysis MWF at [facility]. ESRD secondary to cardiorenal syndrome. PMH of T2DM, neuropathy, HTN, Systolic HF (EF 38-10%) with Bi V ICD,Glaucoma, colon polyps, MSSA, spinal stenosis, SHPT, AOCD.   Patient was in usual state of health until 2 days ago when he developed C/O fever, L knee swelling and R foot pain. He developed fever 101.5 at HD 12/15/2017, was started on cephalexin and sent to [place] for evaluation. Lab results at [facility] essentially WNL. Xray R foot without Fx/dislocation, mild tarsal degenerative changes.  He was sent here for evaluation and has been admitted for fever, L knee swelling and R foot pain per primary. Orthopedics has been consulted.   Patient seen at bedside, he says he is feeling better. Had L knee aspiration and injection per M. Jeffery PAC today. Says he can walk with walker. Recently started hemodialysis in late April, says he is "getting used to it". He follows with Dr. Haroldine Laws for HF, says other than these issues he has been doing OK. C/O fever, pain R foot (improved since adm), swelling L knee. Denies chest pain, SOB, N, V, D, abdominal pain, flank pain, HA, hearing or vision changes. He is a retired [employer] officer, very pleasant and cooperative.    Past Medical History:  Diagnosis Date  . Abnormal nuclear cardiac imaging test Nov 2010   moderate area of infarct in the inferior wall with only minimal reversibility and EF of 28%  . AICD (automatic cardioverter/defibrillator) present   . Allergic rhinitis 01-08-13   Uses nebulizer for chronic sinus issues and Mucinex.  . Arthritis    osteoarthritis   . Asthma    Extrinic  . Blood transfusion    ? at time of bypass surgery   . BPH  (benign prostatic hyperplasia)   . Cellulitis of arm, left    MSSA  . CHF (congestive heart failure) ([place])   . CKD (chronic kidney disease), stage III ([place])    "was told due to meds he takes"-no Renal workup done  . Colon polyps    adenomatous  . Coronary artery disease    remote CABG in 1985, cath in 2003 by Dr. Lia Foyer with no PCI, last nuclear in 2010 showing scar and EF of 28%.   . Diabetes mellitus   . Diabetic neuropathy ([place]) 04/25/2017  . Dyslipidemia   . Dyspnea   . Gait abnormality 04/25/2017  . Glaucoma 01-08-13   tx. eye drops  . Hemodialysis patient ([place])   . HOH (hard of hearing)   . HTN (hypertension)   . Hypercholesterolemia   . Left ventricular dysfunction    28% per nuclear in 2010 and 35 to 40% per echo in 2010  . Myocardial infarction ([place])    1985  . Neuromuscular disorder ([place])    legs mild paralysis-able to walk"nerve damage"-legs- left leg brace   . Neuropathy   . Pneumonia    hx of several times years ago   . Spinal stenosis    Past Surgical History:  Procedure Laterality Date  . A/V FISTULAGRAM Right 10/17/2016   Procedure: A/V Fistulagram;  Surgeon: Conrad Carlisle, MD;  Location: [place] CV LAB;  Service: Cardiovascular;  Laterality: Right;  . BACK SURGERY     hx of back surgery x 4   .  BASCILIC VEIN TRANSPOSITION Right 09/09/2016   Procedure: BASCILIC VEIN TRANSPOSITION Right Arm;  Surgeon: Rosetta Posner, MD;  Location: [facility] OR;  Service: Vascular;  Laterality: Right;  . BI-VENTRICULAR IMPLANTABLE CARDIOVERTER DEFIBRILLATOR N/A 10/10/2014   Procedure: BI-VENTRICULAR IMPLANTABLE CARDIOVERTER DEFIBRILLATOR  (CRT-D);  Surgeon: [name], MD;  Location: [facility] CATH LAB;  Service: Cardiovascular;  Laterality: N/A;  . CARDIAC CATHETERIZATION  2003  . Cataract      recent cataract surgery 6'14  . COLONOSCOPY N/A 01/25/2013   Procedure: COLONOSCOPY;  Surgeon: Irene Shipper, MD;  Location: [facility] ENDOSCOPY;  Service: Endoscopy;  Laterality: N/A;  . CORONARY ARTERY  BYPASS GRAFT  1985   x 5 vessels  . LUMBAR DISC SURGERY    . PERIPHERAL VASCULAR BALLOON ANGIOPLASTY Right 10/17/2016   Procedure: Peripheral Vascular Balloon Angioplasty;  Surgeon: Conrad Henlawson, MD;  Location: [place] CV LAB;  Service: Cardiovascular;  Laterality: Right;  ARM VEINOUS AND CENTRAL VEIN  . PR POLYSOM 6/>YRS SLEEP 4/> ADDL PARAM ATTND  12/07/2015  . PROSTATE SURGERY    . TOTAL KNEE ARTHROPLASTY  08/01/2011   Procedure: TOTAL KNEE ARTHROPLASTY;  Surgeon: Johnn Hai;  Location: [facility] ORS;  Service: Orthopedics;  Laterality: Right;   Family History  Problem Relation Age of Onset  . Heart attack Father   . Heart disease Father   . Colon polyps Father   . Lung cancer Mother   . Diabetes Sister        x 2  . Heart disease Brother        x 2  . Bone cancer Brother   . Lung disease Neg Hx    Social History:  reports that he quit smoking about 59 years ago. His smoking use included cigarettes, pipe, and cigars. He has a 5.00 pack-year smoking history. He has never used smokeless tobacco. He reports that he does not drink alcohol or use drugs. Allergies  Allergen Reactions  . Codeine Other (See Comments)    anxiety  . Hydrocodone Other (See Comments)    Anxiety- can take in liquid form  . Pseudoephedrine Other (See Comments)    Causes heart to race  . Azithromycin Rash   Prior to Admission medications   Medication Sig Start Date End Date Taking? Authorizing Provider  acetaminophen (TYLENOL) 500 MG tablet Take 1,000 mg by mouth every 8 (eight) hours as needed (pain).     [provider]  albuterol (PROVENTIL HFA;VENTOLIN HFA) 108 (90 Base) MCG/ACT inhaler Inhale 2 puffs into the lungs every 4 (four) hours as needed for wheezing or shortness of breath (cough, shortness of breath or wheezing.). 02/10/17   Elby Beck, FNP  amiodarone (PACERONE) 200 MG tablet Take 1 tablet (200 mg total) by mouth daily. 10/03/17   Baldwin Jamaica, PA-C  amLODipine (NORVASC) 5  MG tablet TAKE 1 TABLET BY MOUTH ONCE DAILY 11/17/17   [name], MD  aspirin EC 81 MG tablet Take 81 mg by mouth daily.     [provider]  atorvastatin (LIPITOR) 10 MG tablet Take 1 tablet (10 mg total) by mouth daily. 09/08/17   [name], MD  carvedilol (COREG) 6.25 MG tablet TAKE 1 TABLET BY MOUTH TWICE DAILY WITH A MEAL 11/17/17   Bensimhon, Shaune Pascal, MD  cetirizine (ZYRTEC) 10 MG tablet Take 10 mg by mouth at bedtime as needed (seasonal allergies).     [provider]  colchicine 0.6 MG tablet Take 0.5 tablets (  0.3 mg total) by mouth daily. 10/29/17   [name], MD  dextromethorphan-guaiFENesin (MUCINEX DM) 30-600 MG 12hr tablet Take 1 tablet by mouth 2 (two) times daily as needed for cough. 08/28/17   Rosita Fire, MD  febuxostat (ULORIC) 40 MG tablet Take 1 tablet (40 mg total) by mouth daily. 07/21/17   [name], MD  fluocinonide cream (LIDEX) 3.15 % Apply 1 application topically 2 (two) times daily as needed (for itchy rash).  03/16/15   [provider]  hydrALAZINE (APRESOLINE) 50 MG tablet Take 1 tablet (50 mg total) 3 (three) times daily by mouth. 06/19/17   Tillery, Satira Mccallum, PA-C  metolazone (ZAROXOLYN) 5 MG tablet Take 1 pill as directed by HF clinic if weight goes up 3 lbs overnight, or 5 lbs within one week. Call before taking. Patient not taking: Reported on 11/27/2017 05/06/17   Shirley Friar, PA-C  potassium chloride SA (K-DUR,KLOR-CON) 20 MEQ tablet Take 20 mEq by mouth daily.    [provider]  tadalafil (CIALIS) 20 MG tablet Take 20 mg by mouth daily as needed for erectile dysfunction. Do not exceed 2 doses in 7 days    [provider]  testosterone cypionate (DEPOTESTOTERONE CYPIONATE) 100 MG/ML injection Inject 400 mg into the muscle every 21 ( twenty-one) days. For IM use only    [provider]  torsemide (DEMADEX) 20 MG tablet Take 5 tablets (100 mg total) by mouth daily. Patient  not taking: Reported on 11/27/2017 05/13/17   Arbutus Leas, NP  triamcinolone (NASACORT AQ) 55 MCG/ACT AERO nasal inhaler Place 2 sprays into the nose daily as needed (seasonal allergies).     [provider]  zolpidem (AMBIEN) 10 MG tablet TAKE 1/2 TO 1 (ONE-HALF TO ONE) TABLET BY MOUTH AT BEDTIME AS NEEDED FOR SLEEP 12/04/17   [name], MD   Current Facility-Administered Medications  Medication Dose Route Frequency Provider Last Rate Last Dose  . 0.9 %  sodium chloride infusion  250 mL Intravenous PRN Purohit, Shrey C, MD      . acetaminophen (TYLENOL) tablet 1,000 mg  1,000 mg Oral Q8H PRN Purohit, Shrey C, MD   1,000 mg at 12/16/17 0957  . albuterol (PROVENTIL) (2.5 MG/3ML) 0.083% nebulizer solution 2.5 mg  2.5 mg Inhalation Q4H PRN Purohit, Shrey C, MD      . amiodarone (PACERONE) tablet 200 mg  200 mg Oral Daily Purohit, Shrey C, MD   200 mg at 12/16/17 0958  . amLODipine (NORVASC) tablet 5 mg  5 mg Oral Daily Purohit, Shrey C, MD   5 mg at 12/16/17 0958  . aspirin EC tablet 81 mg  81 mg Oral Daily Purohit, Shrey C, MD   81 mg at 12/16/17 0957  . atorvastatin (LIPITOR) tablet 10 mg  10 mg Oral Daily Purohit, Shrey C, MD   10 mg at 12/16/17 0957  . bupivacaine (MARCAINE) 0.5 % injection 10 mL  10 mL Infiltration Once Lisette Abu, PA-C      . carvedilol (COREG) tablet 6.25 mg  6.25 mg Oral BID WC Purohit, Shrey C, MD   6.25 mg at 12/16/17 0957  . colchicine tablet 0.3 mg  0.3 mg Oral Daily Purohit, Shrey C, MD   0.3 mg at 12/16/17 0958  . dextromethorphan-guaiFENesin (MUCINEX DM) 30-600 MG per 12 hr tablet 1 tablet  1 tablet Oral BID PRN Purohit, Konrad Dolores, MD      . febuxostat (ULORIC) tablet 40  mg  40 mg Oral Daily Purohit, Shrey C, MD   40 mg at 12/16/17 0958  . fluocinonide cream (LIDEX) 1.61 % 1 application  1 application Topical BID PRN Purohit, Shrey C, MD      . heparin injection 5,000 Units  5,000 Units Subcutaneous Q8H Purohit, Shrey C, MD      . hydrALAZINE  (APRESOLINE) tablet 50 mg  50 mg Oral Q8H Purohit, Shrey C, MD      . loratadine (CLARITIN) tablet 10 mg  10 mg Oral Daily Purohit, Shrey C, MD   10 mg at 12/16/17 0958  . methylPREDNISolone acetate (DEPO-MEDROL) injection 40 mg  40 mg Intra-articular Once Lisette Abu, PA-C      . methylPREDNISolone sodium succinate (SOLU-MEDROL) 40 mg/mL injection 40 mg  40 mg Intravenous Once Purohit, Shrey C, MD      . ondansetron (ZOFRAN) tablet 4 mg  4 mg Oral Q6H PRN Purohit, Shrey C, MD       Or  . ondansetron (ZOFRAN) injection 4 mg  4 mg Intravenous Q6H PRN Purohit, Shrey C, MD      . oxyCODONE (Oxy IR/ROXICODONE) immediate release tablet 5 mg  5 mg Oral Q4H PRN Purohit, Shrey C, MD      . piperacillin-tazobactam (ZOSYN) IVPB 3.375 g  3.375 g Intravenous Q12H Laqueta Linden A, RPH      . polyethylene glycol (MIRALAX / GLYCOLAX) packet 17 g  17 g Oral Daily PRN Purohit, Shrey C, MD      . potassium chloride 20 MEQ/15ML (10%) solution 20 mEq  20 mEq Oral Once Purohit, Shrey C, MD      . sodium chloride flush (NS) 0.9 % injection 3 mL  3 mL Intravenous Q12H Purohit, Shrey C, MD      . sodium chloride flush (NS) 0.9 % injection 3 mL  3 mL Intravenous PRN Purohit, Shrey C, MD      . triamcinolone (NASACORT) nasal inhaler 2 spray  2 spray Nasal Daily PRN Purohit, Konrad Dolores, MD      . Derrill Memo ON 12/17/2017] vancomycin (VANCOCIN) IVPB 1000 mg/200 mL premix  1,000 mg Intravenous Q M,W,F-HD Laqueta Linden A, RPH      . zolpidem (AMBIEN) tablet 5 mg  5 mg Oral QHS PRN Purohit, Konrad Dolores, MD       Labs: Basic Metabolic Panel: Recent Labs  Lab 12/16/17 0244  NA 134*  K 3.3*  CL 98*  CO2 26  GLUCOSE 141*  BUN 31*  CREATININE 2.60*  CALCIUM 7.8*   Liver Function Tests: No results for input(s): AST, ALT, ALKPHOS, BILITOT, PROT, ALBUMIN in the last 168 hours. No results for input(s): LIPASE, AMYLASE in the last 168 hours. No results for input(s): AMMONIA in the last 168 hours. CBC: Recent Labs   Lab 12/16/17 0133  WBC 8.9  NEUTROABS 6.6  HGB 11.0*  HCT 34.1*  MCV 92.7  PLT PLATELET CLUMPS NOTED ON SMEAR, COUNT APPEARS ADEQUATE   CBG: Recent Labs  Lab 12/16/17 0807 12/16/17 1133  GLUCAP 130* 194*   Studies/Results: Dg Chest 2 View  Result Date: 12/16/2017 CLINICAL DATA:  Acute onset of fever. Bilateral lower extremity swelling. EXAM: CHEST - 2 VIEW COMPARISON:  Chest radiograph performed 08/26/2017 FINDINGS: The lungs are well-aerated. There is mild elevation of the right hemidiaphragm. There is no evidence of focal opacification, pleural effusion or pneumothorax. The heart is borderline normal in size. The patient is status post median sternotomy. A pacemaker/AICD is  noted at the left chest wall, with leads ending at the right atrium, right ventricle and coronary sinus. No acute osseous abnormalities are seen. IMPRESSION: Mild elevation of the right hemidiaphragm. Lungs remain grossly clear. Electronically Signed   By: Garald Balding M.D.   On: 12/16/2017 02:56   Dg Foot 2 Views Right  Result Date: 12/16/2017 CLINICAL DATA:  Right foot pain EXAM: RIGHT FOOT - 2 VIEW COMPARISON:  None. FINDINGS: No acute fracture or dislocation is noted. Mild tarsal degenerative changes are seen. No soft tissue changes are noted. IMPRESSION: No acute abnormality seen. Electronically Signed   By: Inez Catalina M.D.   On: 12/16/2017 09:14    ROS: As per HPI otherwise negative.   Physical Exam: Vitals:   12/16/17 0621 12/16/17 0743 12/16/17 0745 12/16/17 0800  BP: (!) 145/65     Pulse: 78     Resp: 17 (!) 21    Temp: 97.9 F (36.6 C)     TempSrc: Oral     SpO2: 96%     Weight:   89.2 kg (196 lb 10.4 oz) 87.6 kg (193 lb 3.2 oz)  Height:  5\' 10"  (1.778 m)       General: Well developed, well nourished, in no acute distress. Head: Normocephalic, atraumatic, sclera non-icteric, mucus membranes are moist Neck: Supple. JVD not elevated. Lungs: Clear bilaterally to auscultation without  wheezes, rales, or rhonchi. Breathing is unlabored. Heart: RRR with S1 S2. 2/6 systolic M. No rubs, or gallops appreciated. Abdomen: Soft, non-tender, non-distended with normoactive bowel sounds. No rebound/guarding. No obvious abdominal masses. M-S:  Strength and tone appear normal for age. Lower extremities:Trace BLE edema L > R.   Neuro: Alert and oriented X 3. Moves all extremities spontaneously. Psych:  Responds to questions appropriately with a normal affect. Dialysis Access: RUA AVF + bruit  Dialysis Orders: [facility] MWF 4 hrs 180 NRe 400/800 90.5 kg 3.0 K/2.0 Ca UFP 4 -Heparin 2800 units IV TIW -Mircera 75 mcg IV q 2 weeks (last dose 12/10/2017 Last HGB 10.1 12/10/17) -Venofer 100 mg IV X 10 doses (8/10 doses given Last Fe 38 Tsat 16 12/10/2017) -Calcitriol 0.25 mcg PO TIW  Assessment/Plan: 1.  Fever: BC pending. On vanc/Zosyn per primary. No clear etiology. Synovial fld WBC 16500, Neutrophil 89.  2.  Gout/Pseudogout: Has been on colchicine 0.3 mg daily, however pt says he has never taken this medication-says he misplaced drug. Can use 0.6 mg PO X 1 and repeat in 2 weeks if needed. Per primary 3.  ESRD -  MWF. HD tomorrow on schedule. K+ 3.3 use 4.0 K bath, tight heparin.  4.  Hypertension/volume  - Has trace BLE edema, CXR clear. BP controlled. Currently under OP EDW 90.5 kg. Attempt 1.5-2 liters tomorrow. Adjust EDW upon DC.  5.  Anemia  - HGB 11.0 No ESA needed. Hold Fe load D/T possible sepsis.  6.  Metabolic bone disease -  Continue VDRA Ca 7.8 C Ca 9.0  7.  Nutrition - Albumin 2.5. Renal/Carb mod diet, add prostat, renal vit 8.  Systolic HF-does not appear volume overloaded. Monitor. Continue current medications.  9.  H/O VT on amiodarone, has ICD.  85. DM-per primary  24 CAD H/O CABG  Rita H. Owens Shark, NP-C 12/16/2017, 3:02 PM  [employer] [phone]  Pt seen, examined and agree w A/P as above. ESRD on HD MWF here with knee and foot pain and fevers,  not sure inflammatory or infectious condition.  Has AVF  not grossly infected, has ICD not grossly infected. Blood cx's pending. Due for HD tomorrow. Will follow.  Kelly Splinter MD [employer] pager [phone]   12/16/2017, 4:33 PM

## 2017-12-16 NOTE — ED Notes (Signed)
Pt in XR. 

## 2017-12-16 NOTE — H&P (Signed)
History and Physical    Shane [name] OZH:[id number] DOB: Apr 05, 1934 DOA: 12/15/2017  PCP: [name], MD   Patient coming from: home    Chief Complaint: fever  HPI: Shane [name] is a 84 y.o. male with medical history significant of coronary artery disease status post 5 vessel CABG in 6295, chronic systolic heart failure with EF of 40% by echo in 2017 status post CRT-D comp gated by VT on amiodarone, type 2 diabetes, gated by peripheral neuropathy not on any treatment, gout, restrictive lung disease, end-stage renal disease on Monday Wednesday Friday dialysis, BPH status post surgery, hypertension, hyperlipidemia who comes in with fever, left knee swelling, right foot pain.  Patient reports that he was doing fairly well until approximately 2 days ago when he developed left knee swelling and right foot pain.  He reports that this was similar to his prior gout flareup.  He was seen by his orthopedic doctor who did an arthrocentesis that revealed intracellular monosodium urate crystals and intracellular calcium pyrophosphate crystals as well as a white count of 16,500 there was 89% neutrophils.  It was felt the patient most likely had gout and pseudogout combined.  Patient was in dialysis yesterday and developed a fever to 101 and was given oral cephalexin and sent to [facility] due to concern for infection.  Patient otherwise denies any abdominal pain, emesis, diarrhea, cough, congestion, rhinorrhea, chest pain, shortness of breath, lower extremity edema.  He does report some nausea since starting dialysis.  He does have chronic lower extremity numbness and tingling due to neuropathy.  ED Course: At [facility] his vitals were notable for a fever to 101.5.  His white count was 8.9 and normal.  His creatinine was 2.6.  Otherwise he was mildly thrombocytopenic.  His lactate was normal.  Chest x-ray was normal.  Patient was transferred to [facility].  Review of Systems: As  per HPI otherwise 10 point review of systems negative.    Past Medical History:  Diagnosis Date  . Abnormal nuclear cardiac imaging test Nov 2010   moderate area of infarct in the inferior wall with only minimal reversibility and EF of 28%  . AICD (automatic cardioverter/defibrillator) present   . Allergic rhinitis 01-08-13   Uses nebulizer for chronic sinus issues and Mucinex.  . Arthritis    osteoarthritis   . Asthma    Extrinic  . Blood transfusion    ? at time of bypass surgery   . BPH (benign prostatic hyperplasia)   . Cellulitis of arm, left    MSSA  . CHF (congestive heart failure) ([place])   . CKD (chronic kidney disease), stage III ([place])    "was told due to meds he takes"-no Renal workup done  . Colon polyps    adenomatous  . Coronary artery disease    remote CABG in 1985, cath in 2003 by Dr. Lia Foyer with no PCI, last nuclear in 2010 showing scar and EF of 28%.   . Diabetes mellitus   . Diabetic neuropathy ([place]) 04/25/2017  . Dyslipidemia   . Dyspnea   . Gait abnormality 04/25/2017  . Glaucoma 01-08-13   tx. eye drops  . Hemodialysis patient ([place])   . HOH (hard of hearing)   . HTN (hypertension)   . Hypercholesterolemia   . Left ventricular dysfunction    28% per nuclear in 2010 and 35 to 40% per echo in 2010  . Myocardial infarction ([place])    1985  .  Neuromuscular disorder ([place])    legs mild paralysis-able to walk"nerve damage"-legs- left leg brace   . Neuropathy   . Pneumonia    hx of several times years ago   . Spinal stenosis     Past Surgical History:  Procedure Laterality Date  . A/V FISTULAGRAM Right 10/17/2016   Procedure: A/V Fistulagram;  Surgeon: Conrad Farwell, MD;  Location: [facility];  Service: Cardiovascular;  Laterality: Right;  . BACK SURGERY     hx of back surgery x 4   . BASCILIC VEIN TRANSPOSITION Right 09/09/2016   Procedure: BASCILIC VEIN TRANSPOSITION Right Arm;  Surgeon: Rosetta Posner, MD;  Location: [facility] OR;  Service: Vascular;   Laterality: Right;  . BI-VENTRICULAR IMPLANTABLE CARDIOVERTER DEFIBRILLATOR N/A 10/10/2014   Procedure: BI-VENTRICULAR IMPLANTABLE CARDIOVERTER DEFIBRILLATOR  (CRT-D);  Surgeon: [name], MD;  Location: [facility] CATH LAB;  Service: Cardiovascular;  Laterality: N/A;  . CARDIAC CATHETERIZATION  2003  . Cataract      recent cataract surgery 6'14  . COLONOSCOPY N/A 01/25/2013   Procedure: COLONOSCOPY;  Surgeon: Irene Shipper, MD;  Location: [facility] ENDOSCOPY;  Service: Endoscopy;  Laterality: N/A;  . CORONARY ARTERY BYPASS GRAFT  1985   x 5 vessels  . LUMBAR DISC SURGERY    . PERIPHERAL VASCULAR BALLOON ANGIOPLASTY Right 10/17/2016   Procedure: Peripheral Vascular Balloon Angioplasty;  Surgeon: Conrad Traskwood, MD;  Location: [facility];  Service: Cardiovascular;  Laterality: Right;  ARM VEINOUS AND CENTRAL VEIN  . PR POLYSOM 6/>YRS SLEEP 4/> ADDL PARAM ATTND  12/07/2015  . PROSTATE SURGERY    . TOTAL KNEE ARTHROPLASTY  08/01/2011   Procedure: TOTAL KNEE ARTHROPLASTY;  Surgeon: Johnn Hai;  Location: [facility] ORS;  Service: Orthopedics;  Laterality: Right;     reports that he quit smoking about 59 years ago. His smoking use included cigarettes, pipe, and cigars. He has a 5.00 pack-year smoking history. He has never used smokeless tobacco. He reports that he does not drink alcohol or use drugs.  Allergies  Allergen Reactions  . Codeine Other (See Comments)    anxiety  . Hydrocodone Other (See Comments)    Anxiety- can take in liquid form  . Pseudoephedrine Other (See Comments)    Causes heart to race  . Azithromycin Rash    Family History  Problem Relation Age of Onset  . Heart attack Father   . Heart disease Father   . Colon polyps Father   . Lung cancer Mother   . Diabetes Sister        x 2  . Heart disease Brother        x 2  . Bone cancer Brother   . Lung disease Neg Hx    Unacceptable: Noncontributory, unremarkable, or negative. Acceptable: Family history reviewed and not  pertinent (If you reviewed it)  Prior to Admission medications   Medication Sig Start Date End Date Taking? Authorizing Provider  acetaminophen (TYLENOL) 500 MG tablet Take 1,000 mg by mouth every 8 (eight) hours as needed (pain).     [provider]  albuterol (PROVENTIL HFA;VENTOLIN HFA) 108 (90 Base) MCG/ACT inhaler Inhale 2 puffs into the lungs every 4 (four) hours as needed for wheezing or shortness of breath (cough, shortness of breath or wheezing.). 02/10/17   Elby Beck, FNP  amiodarone (PACERONE) 200 MG tablet Take 1 tablet (200 mg total) by mouth daily. 10/03/17   Baldwin Jamaica, PA-C  amLODipine (NORVASC) 5  MG tablet TAKE 1 TABLET BY MOUTH ONCE DAILY 11/17/17   [name], MD  aspirin EC 81 MG tablet Take 81 mg by mouth daily.     [provider]  atorvastatin (LIPITOR) 10 MG tablet Take 1 tablet (10 mg total) by mouth daily. 09/08/17   [name], MD  carvedilol (COREG) 6.25 MG tablet TAKE 1 TABLET BY MOUTH TWICE DAILY WITH A MEAL 11/17/17   Bensimhon, Shaune Pascal, MD  cetirizine (ZYRTEC) 10 MG tablet Take 10 mg by mouth at bedtime as needed (seasonal allergies).     [provider]  colchicine 0.6 MG tablet Take 0.5 tablets (0.3 mg total) by mouth daily. 10/29/17   [name], MD  dextromethorphan-guaiFENesin (MUCINEX DM) 30-600 MG 12hr tablet Take 1 tablet by mouth 2 (two) times daily as needed for cough. 08/28/17   Rosita Fire, MD  febuxostat (ULORIC) 40 MG tablet Take 1 tablet (40 mg total) by mouth daily. 07/21/17   [name], MD  fluocinonide cream (LIDEX) 9.52 % Apply 1 application topically 2 (two) times daily as needed (for itchy rash).  03/16/15   [provider]  hydrALAZINE (APRESOLINE) 50 MG tablet Take 1 tablet (50 mg total) 3 (three) times daily by mouth. 06/19/17   Tillery, Satira Mccallum, PA-C  metolazone (ZAROXOLYN) 5 MG tablet Take 1 pill as directed by HF clinic if weight goes up 3 lbs overnight, or 5 lbs  within one week. Call before taking. Patient not taking: Reported on 11/27/2017 05/06/17   Shirley Friar, PA-C  potassium chloride SA (K-DUR,KLOR-CON) 20 MEQ tablet Take 20 mEq by mouth daily.    [provider]  tadalafil (CIALIS) 20 MG tablet Take 20 mg by mouth daily as needed for erectile dysfunction. Do not exceed 2 doses in 7 days    [provider]  testosterone cypionate (DEPOTESTOTERONE CYPIONATE) 100 MG/ML injection Inject 400 mg into the muscle every 21 ( twenty-one) days. For IM use only    [provider]  torsemide (DEMADEX) 20 MG tablet Take 5 tablets (100 mg total) by mouth daily. Patient not taking: Reported on 11/27/2017 05/13/17   Arbutus Leas, NP  triamcinolone (NASACORT AQ) 55 MCG/ACT AERO nasal inhaler Place 2 sprays into the nose daily as needed (seasonal allergies).     [provider]  zolpidem (AMBIEN) 10 MG tablet TAKE 1/2 TO 1 (ONE-HALF TO ONE) TABLET BY MOUTH AT BEDTIME AS NEEDED FOR SLEEP 12/04/17   [name], MD    Physical Exam: Vitals:   12/16/17 0430 12/16/17 0446 12/16/17 0517 12/16/17 0621  BP: 130/62  129/66 (!) 145/65  Pulse: 80  76 78  Resp:   (!) 23 17  Temp:  (!) 100.9 F (38.3 C)  97.9 F (36.6 C)  TempSrc:  Rectal  Oral  SpO2: 94%  96% 96%    Constitutional: NAD, calm, comfortable Vitals:   12/16/17 0430 12/16/17 0446 12/16/17 0517 12/16/17 0621  BP: 130/62  129/66 (!) 145/65  Pulse: 80  76 78  Resp:   (!) 23 17  Temp:  (!) 100.9 F (38.3 C)  97.9 F (36.6 C)  TempSrc:  Rectal  Oral  SpO2: 94%  96% 96%   Eyes: Anicteric sclera ENMT: Mucous membranes are moison.  Neck: normal, supple Respiratory: clear to auscultation bilaterally, no wheezing, no crackles. Normal respiratory effort. No accessory muscle use.  Cardiovascular: Regular rate and rhythm, 2 out of 6 murmur transmitted  from right upper extremity fistula Abdomen: no tenderness, no masses palpated. No hepatosplenomegaly. Bowel  sounds positive.  Musculoskeletal: Mild edema of right aortic arch, mild erythema, pain on palpation, mild diffuse swelling of left knee Skin: no rashes on visible skin, well-healed incisions over right knee and sternum Neurologic: CN 2-12 grossly intact.  Diminished sensation in bilateral lower extremities Psychiatric: Normal judgment and insight. Alert and oriented x 3. Normal mood.     Labs on Admission: I have personally reviewed following labs and imaging studies  CBC: Recent Labs  Lab 12/16/17 0133  WBC 8.9  NEUTROABS 6.6  HGB 11.0*  HCT 34.1*  MCV 92.7  PLT PLATELET CLUMPS NOTED ON SMEAR, COUNT APPEARS ADEQUATE   Basic Metabolic Panel: Recent Labs  Lab 12/16/17 0244  NA 134*  K 3.3*  CL 98*  CO2 26  GLUCOSE 141*  BUN 31*  CREATININE 2.60*  CALCIUM 7.8*   GFR: CrCl cannot be calculated (Unknown ideal weight.). Liver Function Tests: No results for input(s): AST, ALT, ALKPHOS, BILITOT, PROT, ALBUMIN in the last 168 hours. No results for input(s): LIPASE, AMYLASE in the last 168 hours. No results for input(s): AMMONIA in the last 168 hours. Coagulation Profile: No results for input(s): INR, PROTIME in the last 168 hours. Cardiac Enzymes: No results for input(s): CKTOTAL, CKMB, CKMBINDEX, TROPONINI in the last 168 hours. BNP (last 3 results) No results for input(s): PROBNP in the last 8760 hours. HbA1C: No results for input(s): HGBA1C in the last 72 hours. CBG: No results for input(s): GLUCAP in the last 168 hours. Lipid Profile: No results for input(s): CHOL, HDL, LDLCALC, TRIG, CHOLHDL, LDLDIRECT in the last 72 hours. Thyroid Function Tests: No results for input(s): TSH, T4TOTAL, FREET4, T3FREE, THYROIDAB in the last 72 hours. Anemia Panel: No results for input(s): VITAMINB12, FOLATE, FERRITIN, TIBC, IRON, RETICCTPCT in the last 72 hours. Urine analysis:    Component Value Date/Time   COLORURINE AMBER (A) 12/16/2017 0346   APPEARANCEUR HAZY (A)  12/16/2017 0346   LABSPEC 1.025 12/16/2017 0346   PHURINE 6.0 12/16/2017 0346   GLUCOSEU NEGATIVE 12/16/2017 0346   GLUCOSEU NEGATIVE 06/29/2012 1218   HGBUR MODERATE (A) 12/16/2017 0346   BILIRUBINUR SMALL (A) 12/16/2017 0346   KETONESUR NEGATIVE 12/16/2017 0346   PROTEINUR >300 (A) 12/16/2017 0346   UROBILINOGEN 0.2 10/05/2014 2249   NITRITE NEGATIVE 12/16/2017 0346   LEUKOCYTESUR NEGATIVE 12/16/2017 0346    Radiological Exams on Admission: Dg Chest 2 View  Result Date: 12/16/2017 CLINICAL DATA:  Acute onset of fever. Bilateral lower extremity swelling. EXAM: CHEST - 2 VIEW COMPARISON:  Chest radiograph performed 08/26/2017 FINDINGS: The lungs are well-aerated. There is mild elevation of the right hemidiaphragm. There is no evidence of focal opacification, pleural effusion or pneumothorax. The heart is borderline normal in size. The patient is status post median sternotomy. A pacemaker/AICD is noted at the left chest wall, with leads ending at the right atrium, right ventricle and coronary sinus. No acute osseous abnormalities are seen. IMPRESSION: Mild elevation of the right hemidiaphragm. Lungs remain grossly clear. Electronically Signed   By: Garald Balding M.D.   On: 12/16/2017 02:56     Assessment/Plan Principal Problem:   Gout of left knee Active Problems:   Ischemic cardiomyopathy   Hypercholesterolemia   DM (diabetes mellitus), type 2 with renal complications (HCC)   Osteoarthritis   CAD s/p coronary arthery bypass graft   Chronic kidney disease (CKD), stage IV (severe) ([place])   H/O Spinal stenosis  Asthma, moderate persistent   Essential hypertension   Severe obstructive sleep apnea   Restrictive lung disease   Benign prostatic hyperplasia without urinary obstruction   Chronic combined systolic and diastolic CHF (congestive heart failure) (HCC)   Peripheral neuropathy   Fever   Chronic gout of left knee   Hypokalemia   ICD (implantable cardioverter-defibrillator)  discharge   Ventricular tachycardia (HCC)  #) Fever with left knee swelling and right foot pain: Unclear etiology however differential includes sepsis, septic arthritis, osteomyelitis,.  Gout/pseudogout exacerbation.  He is unfortunately a very high risk for complications due to his numerous numerous medical problems.  His gout level being low suggestive of possibly an acute flare. -X-ray of right foot - Follow-up blood cultures x2 obtained at [facility] on 12/16/2017 -Follow-up synovial cultures obtained on 12/15/2017 in clinic -Orthopedics consult -ESR and CRP ordered -Empiric vancomycin and Zosyn   #) Gout/pseudogout: -Continue from Oxistat 40 mg daily - Continue colchicine 0.3 mg daily -We will hold on steroids until patient evaluated by orthopedic surgery as patient might have infection  #) Coronary artery disease status post CABG: -Continue aspirin 81 mg -Continue beta-blocker -Continue hydralazine -Continue statin  #) Hypertension/hyperlipidemia: -Continue amlodipine 5 mg daily -Continue atorvastatin 10 mg daily -Continue carvedilol 6.25 mg twice daily - Continue hydralazine 50 mg 3 times daily  #) Chronic systolic heart failure status post CRT-D: -Continue beta-blocker -Diuresis per renal - Continue hydralazine -Patient is off all diuretics  #) History of VT: -Continue amiodarone 200 mg daily  #) Restrictive lung disease: - Continue PRN bronchodilators  #) Type 2 diabetes complicated by peripheral neuropathy: Patient no longer requires any treatment for this. -Check blood sugars with dialysis  Fluids: Restricted Elect lites: Monitor and supplement Nutrition: Carb/renal diet  Prophylaxis: Subcu heparin  Disposition: Pending ruling out of infection  Full code  Cristy Folks MD [facility]  If 7PM-7AM, please contact night-coverage [email or web address] Password [email or web address]  12/16/2017, 7:30 AM

## 2017-12-16 NOTE — ED Provider Notes (Signed)
[facility] EMERGENCY DEPARTMENT Provider Note   CSN: [id number] Arrival date & time: 12/15/17  2156     History   Chief Complaint Chief Complaint  Patient presents with  . Leg Swelling    HPI Shane [name] is a 84 y.o. male.  The history is provided by the patient.  Leg Pain   This is a new problem. The current episode started more than 2 days ago (4 days ago). The problem occurs constantly. The problem has been gradually worsening. The pain is present in the left knee and right foot. The quality of the pain is described as aching. The pain is severe. Pertinent negatives include no numbness. Treatments tried: aspiration of the left knee and keflex bid x 3 day. The treatment provided no relief. There has been no history of extremity trauma. Family history is significant for gout.  Has had fever to 101 at home.  Started on Keflex by dialysis.  Knee tapped and has crystals but high white count.    Past Medical History:  Diagnosis Date  . Abnormal nuclear cardiac imaging test Nov 2010   moderate area of infarct in the inferior wall with only minimal reversibility and EF of 28%  . AICD (automatic cardioverter/defibrillator) present   . Allergic rhinitis 01-08-13   Uses nebulizer for chronic sinus issues and Mucinex.  . Arthritis    osteoarthritis   . Asthma    Extrinic  . Blood transfusion    ? at time of bypass surgery   . BPH (benign prostatic hyperplasia)   . Cellulitis of arm, left    MSSA  . CHF (congestive heart failure) ([place])   . CKD (chronic kidney disease), stage III ([place])    "was told due to meds he takes"-no Renal workup done  . Colon polyps    adenomatous  . Coronary artery disease    remote CABG in 1985, cath in 2003 by Dr. Lia Foyer with no PCI, last nuclear in 2010 showing scar and EF of 28%.   . Diabetes mellitus   . Diabetic neuropathy ([place]) 04/25/2017  . Dyslipidemia   . Dyspnea   . Gait abnormality 04/25/2017  . Glaucoma 01-08-13   tx. eye  drops  . Hemodialysis patient ([place])   . HOH (hard of hearing)   . HTN (hypertension)   . Hypercholesterolemia   . Left ventricular dysfunction    28% per nuclear in 2010 and 35 to 40% per echo in 2010  . Myocardial infarction ([place])    1985  . Neuromuscular disorder ([place])    legs mild paralysis-able to walk"nerve damage"-legs- left leg brace   . Neuropathy   . Pneumonia    hx of several times years ago   . Spinal stenosis     Patient Active Problem List   Diagnosis Date Noted  . Gout of left knee 12/16/2017  . Fatigue 09/08/2017  . Hypokalemia 08/22/2017  . Elevated troponin 08/22/2017  . ICD (implantable cardioverter-defibrillator) discharge 08/22/2017  . Ventricular tachycardia ([place]) 08/22/2017  . Chronic gout of left knee 07/21/2017  . Diabetic neuropathy ([place]) 04/25/2017  . Gait abnormality 04/25/2017  . Numbness and tingling of both feet 03/04/2017  . Peripheral neuropathy 03/04/2017  . Carotid artery disease ([place]) 01/09/2017  . Chronic combined systolic and diastolic CHF (congestive heart failure) ([place]) 07/01/2016  . Restrictive lung disease 01/22/2016  . Severe obstructive sleep apnea 12/17/2015  . Essential hypertension 11/16/2015  . 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 LBBB (left bundle branch  block) 10/10/2014  . Syncope 10/06/2014  . Pain in thumb joint with movement of right hand 01/04/2014  . Sinusitis, chronic 07/16/2013  . Asthma, moderate persistent 07/08/2013  . Benign prostatic hypertrophy without urinary obstruction 12/18/2011  . CAD s/p coronary arthery bypass graft   . Chronic kidney disease (CKD), stage IV (severe) ([place])   . H/O Spinal stenosis   . DM (diabetes mellitus), type 2 with renal complications ([place]) 35/32/9924  . Benign hypertensive heart disease without heart failure 11/07/2010  . Allergic rhinitis 11/07/2010  . Osteoarthritis 11/07/2010  . Ischemic cardiomyopathy   . Hypercholesterolemia     Past Surgical History:  Procedure Laterality Date   . A/V FISTULAGRAM Right 10/17/2016   Procedure: A/V Fistulagram;  Surgeon: Conrad Holton, MD;  Location: [facility] CV LAB;  Service: Cardiovascular;  Laterality: Right;  . BACK SURGERY     hx of back surgery x 4   . BASCILIC VEIN TRANSPOSITION Right 09/09/2016   Procedure: BASCILIC VEIN TRANSPOSITION Right Arm;  Surgeon: Rosetta Posner, MD;  [facility];  Service: Vascular;  Laterality: Right;  . BI-VENTRICULAR IMPLANTABLE CARDIOVERTER DEFIBRILLATOR N/A 10/10/2014   Procedure: BI-VENTRICULAR IMPLANTABLE CARDIOVERTER DEFIBRILLATOR  (CRT-D);  Surgeon: [name], MD;  Location: [facility] CATH LAB;  Service: Cardiovascular;  Laterality: N/A;  . CARDIAC CATHETERIZATION  2003  . Cataract      recent cataract surgery 6'14  . COLONOSCOPY N/A 01/25/2013   Procedure: COLONOSCOPY;  Surgeon: Irene Shipper, MD;  Location: [facility] ENDOSCOPY;  Service: Endoscopy;  Laterality: N/A;  . CORONARY ARTERY BYPASS GRAFT  1985   x 5 vessels  . LUMBAR DISC SURGERY    . PERIPHERAL VASCULAR BALLOON ANGIOPLASTY Right 10/17/2016   Procedure: Peripheral Vascular Balloon Angioplasty;  Surgeon: Conrad , MD;  Location: [facility] CV LAB;  Service: Cardiovascular;  Laterality: Right;  ARM VEINOUS AND CENTRAL VEIN  . PR POLYSOM 6/>YRS SLEEP 4/> ADDL PARAM ATTND  12/07/2015  . PROSTATE SURGERY    . TOTAL KNEE ARTHROPLASTY  08/01/2011   Procedure: TOTAL KNEE ARTHROPLASTY;  Surgeon: Johnn Hai;  Location: [facility] ORS;  Service: Orthopedics;  Laterality: Right;        Home Medications    Prior to Admission medications   Medication Sig Start Date End Date Taking? Authorizing Provider  acetaminophen (TYLENOL) 500 MG tablet Take 1,000 mg by mouth every 8 (eight) hours as needed (pain).     [provider]  albuterol (PROVENTIL HFA;VENTOLIN HFA) 108 (90 Base) MCG/ACT inhaler Inhale 2 puffs into the lungs every 4 (four) hours as needed for wheezing or shortness of breath (cough, shortness of breath or wheezing.). 02/10/17    Elby Beck, FNP  amiodarone (PACERONE) 200 MG tablet Take 1 tablet (200 mg total) by mouth daily. 10/03/17   Baldwin Jamaica, PA-C  amLODipine (NORVASC) 5 MG tablet TAKE 1 TABLET BY MOUTH ONCE DAILY 11/17/17   [name], MD  aspirin EC 81 MG tablet Take 81 mg by mouth daily.     [provider]  atorvastatin (LIPITOR) 10 MG tablet Take 1 tablet (10 mg total) by mouth daily. 09/08/17   [name], MD  carvedilol (COREG) 6.25 MG tablet TAKE 1 TABLET BY MOUTH TWICE DAILY WITH A MEAL 11/17/17   Bensimhon, Shaune Pascal, MD  cetirizine (ZYRTEC) 10 MG tablet Take 10 mg by mouth at bedtime as needed (seasonal allergies).     [provider]  colchicine 0.6 MG  tablet Take 0.5 tablets (0.3 mg total) by mouth daily. 10/29/17   [name], MD  dextromethorphan-guaiFENesin (MUCINEX DM) 30-600 MG 12hr tablet Take 1 tablet by mouth 2 (two) times daily as needed for cough. 08/28/17   Rosita Fire, MD  febuxostat (ULORIC) 40 MG tablet Take 1 tablet (40 mg total) by mouth daily. 07/21/17   [name], MD  fluocinonide cream (LIDEX) 1.32 % Apply 1 application topically 2 (two) times daily as needed (for itchy rash).  03/16/15   [provider]  hydrALAZINE (APRESOLINE) 50 MG tablet Take 1 tablet (50 mg total) 3 (three) times daily by mouth. 06/19/17   Tillery, Satira Mccallum, PA-C  metolazone (ZAROXOLYN) 5 MG tablet Take 1 pill as directed by HF clinic if weight goes up 3 lbs overnight, or 5 lbs within one week. Call before taking. Patient not taking: Reported on 11/27/2017 05/06/17   Shirley Friar, PA-C  potassium chloride SA (K-DUR,KLOR-CON) 20 MEQ tablet Take 20 mEq by mouth daily.    [provider]  tadalafil (CIALIS) 20 MG tablet Take 20 mg by mouth daily as needed for erectile dysfunction. Do not exceed 2 doses in 7 days    [provider]  testosterone cypionate (DEPOTESTOTERONE CYPIONATE) 100 MG/ML injection Inject 400 mg into the  muscle every 21 ( twenty-one) days. For IM use only    [provider]  torsemide (DEMADEX) 20 MG tablet Take 5 tablets (100 mg total) by mouth daily. Patient not taking: Reported on 11/27/2017 05/13/17   Arbutus Leas, NP  triamcinolone (NASACORT AQ) 55 MCG/ACT AERO nasal inhaler Place 2 sprays into the nose daily as needed (seasonal allergies).     [provider]  zolpidem (AMBIEN) 10 MG tablet TAKE 1/2 TO 1 (ONE-HALF TO ONE) TABLET BY MOUTH AT BEDTIME AS NEEDED FOR SLEEP 12/04/17   [name], MD    Family History Family History  Problem Relation Age of Onset  . Heart attack Father   . Heart disease Father   . Colon polyps Father   . Lung cancer Mother   . Diabetes Sister        x 2  . Heart disease Brother        x 2  . Bone cancer Brother   . Lung disease Neg Hx     Social History Social History   Tobacco Use  . Smoking status: Former Smoker    Packs/day: 1.00    Years: 5.00    Pack years: 5.00    Types: Cigarettes, Pipe, Cigars    Last attempt to quit: 08/05/1958    Years since quitting: 59.4  . Smokeless tobacco: Never Used  Substance Use Topics  . Alcohol use: No  . Drug use: No     Allergies   Codeine; Hydrocodone; Pseudoephedrine; and Azithromycin   Review of Systems Review of Systems  Constitutional: Positive for fever.  HENT: Negative for congestion.   Respiratory: Positive for cough. Negative for shortness of breath.   Cardiovascular: Negative for chest pain and palpitations.  Gastrointestinal: Positive for nausea. Negative for abdominal pain and diarrhea.  Genitourinary: Negative for dysuria.  Musculoskeletal: Positive for arthralgias and joint swelling. Negative for neck pain and neck stiffness.  Neurological: Negative for numbness.     Physical Exam Updated Vital Signs BP 130/62   Pulse 80   Temp (!) 101.5 F (38.6 C) (Rectal)   Resp 18   SpO2 94%   Physical Exam  Constitutional: He is oriented to person, place, and  time. He appears well-developed and well-nourished. No distress.  HENT:  Head: Normocephalic and atraumatic.  Mouth/Throat: No oropharyngeal exudate.  Eyes: Pupils are equal, round, and reactive to light. Conjunctivae are normal.  Neck: Normal range of motion. Neck supple. No JVD present.  Cardiovascular: Normal rate, regular rhythm, normal heart sounds and intact distal pulses.  Pulmonary/Chest: Effort normal and breath sounds normal. No stridor. No respiratory distress. He has no wheezes. He has no rales.  Abdominal: Soft. Bowel sounds are normal. He exhibits no mass. There is no tenderness. There is no rebound and no guarding.  Musculoskeletal: Normal range of motion.       Legs: Neurological: He is alert and oriented to person, place, and time. He displays normal reflexes.  Skin: Skin is warm and dry. Capillary refill takes less than 2 seconds. There is erythema.  Psychiatric: He has a normal mood and affect.     ED Treatments / Results  Labs (all labs ordered are listed, but only abnormal results are displayed) Results for orders placed or performed during the hospital encounter of 12/15/17  CBC with Differential/Platelet  Result Value Ref Range   WBC 8.9 4.0 - 10.5 K/uL   RBC 3.68 (L) 4.22 - 5.81 MIL/uL   Hemoglobin 11.0 (L) 13.0 - 17.0 g/dL   HCT 34.1 (L) 39.0 - 52.0 %   MCV 92.7 78.0 - 100.0 fL   MCH 29.9 26.0 - 34.0 pg   MCHC 32.3 30.0 - 36.0 g/dL   RDW 14.9 11.5 - 15.5 %   Platelets  150 - 400 K/uL    PLATELET CLUMPS NOTED ON SMEAR, COUNT APPEARS ADEQUATE   Neutrophils Relative % 74 %   Neutro Abs 6.6 1.7 - 7.7 K/uL   Lymphocytes Relative 13 %   Lymphs Abs 1.2 0.7 - 4.0 K/uL   Monocytes Relative 12 %   Monocytes Absolute 1.1 0.1 - 1.0 K/uL   Eosinophils Relative 1 %   Eosinophils Absolute 0.1 0.0 - 0.7 K/uL   Basophils Relative 0 %   Basophils Absolute 0.0 0.0 - 0.1 K/uL   Smear Review MORPHOLOGY UNREMARKABLE   Urinalysis, Routine w reflex microscopic  Result  Value Ref Range   Color, Urine AMBER (A) YELLOW   APPearance HAZY (A) CLEAR   Specific Gravity, Urine 1.025 1.005 - 1.030   pH 6.0 5.0 - 8.0   Glucose, UA NEGATIVE NEGATIVE mg/dL   Hgb urine dipstick MODERATE (A) NEGATIVE   Bilirubin Urine SMALL (A) NEGATIVE   Ketones, ur NEGATIVE NEGATIVE mg/dL   Protein, ur >300 (A) NEGATIVE mg/dL   Nitrite NEGATIVE NEGATIVE   Leukocytes, UA NEGATIVE NEGATIVE  Basic metabolic panel  Result Value Ref Range   Sodium 134 (L) 135 - 145 mmol/L   Potassium 3.3 (L) 3.5 - 5.1 mmol/L   Chloride 98 (L) 101 - 111 mmol/L   CO2 26 22 - 32 mmol/L   Glucose, Bld 141 (H) 65 - 99 mg/dL   BUN 31 (H) 6 - 20 mg/dL   Creatinine, Ser 2.60 (H) 0.61 - 1.24 mg/dL   Calcium 7.8 (L) 8.9 - 10.3 mg/dL   GFR calc non Af Amer 21 (L) >60 mL/min   GFR calc Af Amer 25 (L) >60 mL/min   Anion gap 10 5 - 15  Urinalysis, Microscopic (reflex)  Result Value Ref Range   RBC / HPF 0-5 0 - 5 RBC/hpf   WBC, UA 0-5 0 -  5 WBC/hpf   Bacteria, UA MANY (A) NONE SEEN   Squamous Epithelial / LPF 0-5 0 - 5   Mucus PRESENT    Hyphae Yeast PRESENT    Granular Casts, UA PRESENT   I-Stat CG4 Lactic Acid, ED  Result Value Ref Range   Lactic Acid, Venous 1.14 0.5 - 1.9 mmol/L   Dg Chest 2 View  Result Date: 12/16/2017 CLINICAL DATA:  Acute onset of fever. Bilateral lower extremity swelling. EXAM: CHEST - 2 VIEW COMPARISON:  Chest radiograph performed 08/26/2017 FINDINGS: The lungs are well-aerated. There is mild elevation of the right hemidiaphragm. There is no evidence of focal opacification, pleural effusion or pneumothorax. The heart is borderline normal in size. The patient is status post median sternotomy. A pacemaker/AICD is noted at the left chest wall, with leads ending at the right atrium, right ventricle and coronary sinus. No acute osseous abnormalities are seen. IMPRESSION: Mild elevation of the right hemidiaphragm. Lungs remain grossly clear. Electronically Signed   By: Garald Balding  M.D.   On: 12/16/2017 02:56    Radiology Dg Chest 2 View  Result Date: 12/16/2017 CLINICAL DATA:  Acute onset of fever. Bilateral lower extremity swelling. EXAM: CHEST - 2 VIEW COMPARISON:  Chest radiograph performed 08/26/2017 FINDINGS: The lungs are well-aerated. There is mild elevation of the right hemidiaphragm. There is no evidence of focal opacification, pleural effusion or pneumothorax. The heart is borderline normal in size. The patient is status post median sternotomy. A pacemaker/AICD is noted at the left chest wall, with leads ending at the right atrium, right ventricle and coronary sinus. No acute osseous abnormalities are seen. IMPRESSION: Mild elevation of the right hemidiaphragm. Lungs remain grossly clear. Electronically Signed   By: Garald Balding M.D.   On: 12/16/2017 02:56    Procedures Procedures (including critical care time)  Medications Ordered in ED Medications  methylPREDNISolone sodium succinate (SOLU-MEDROL) 40 mg/mL injection 40 mg (has no administration in time range)  potassium chloride 20 MEQ/15ML (10%) solution 20 mEq (20 mEq Oral Not Given 12/16/17 0420)  vancomycin (VANCOCIN) IVPB 1000 mg/200 mL premix (0 mg Intravenous Stopped 12/16/17 0400)  piperacillin-tazobactam (ZOSYN) IVPB 3.375 g (0 g Intravenous Stopped 12/16/17 0241)  acetaminophen (TYLENOL) tablet 1,000 mg (1,000 mg Oral Given 12/16/17 0249)      Final Clinical Impressions(s) / ED Diagnoses   Final diagnoses:  Cellulitis of toe of right foot  Knee effusion, left    Admit to medicine    Mikhaela Zaugg, MD 12/16/17 1700

## 2017-12-16 NOTE — Progress Notes (Signed)
Pharmacy Antibiotic Note  Shane [name] is a 84 y.o. male admitted on 12/15/2017 with cellulitis.  Pharmacy has been consulted for Vancomycin and Zosyn dosing dosing.  Plan: Vancomycin 1000 mg IV every 24 hours.  Goal trough 10-15 mcg/mL. Zosyn 3.375g IV q8h (4 hour infusion).  Height: 5\' 10"  (177.8 cm) Weight: 196 lb 10.4 oz (89.2 kg) IBW/kg (Calculated) : 73  Temp (24hrs), Avg:99.8 F (37.7 C), Min:97.9 F (36.6 C), Max:101.5 F (38.6 C)  Recent Labs  Lab 12/16/17 0129 12/16/17 0133 12/16/17 0244  WBC  --  8.9  --   CREATININE  --   --  2.60*  LATICACIDVEN 1.14  --   --     Estimated Creatinine Clearance: 24.6 mL/min (A) (by C-G formula based on SCr of 2.6 mg/dL (H)).    Allergies  Allergen Reactions  . Codeine Other (See Comments)    anxiety  . Hydrocodone Other (See Comments)    Anxiety- can take in liquid form  . Pseudoephedrine Other (See Comments)    Causes heart to race  . Azithromycin Rash    Antimicrobials this admission: Vanco 5/14 >>  Zosyn 5/14 >>   Thank you for allowing pharmacy to be a part of this patient's care.  Corinda Gubler, PharmD, [facility] 12/16/2017 7:55 AM

## 2017-12-16 NOTE — Care Management (Signed)
This is a no charge note  Transfer from [facility] per Dr. Randal Buba  84 year old male with a past medical history of gout, hypertension, hyperlipidemia, asthma, AICD, BPH, CHF with a EF of 40%, CKD 3, CKD, CABG, ESRD-HD, who presents with left knee and right foot pain and swelling and fever.  Patient was seen by orthopedic surgeon yesterday, and had aspiration of the left knee.  Synovial fluid analysis showed INTRACELLULAR MONOSODIUM URATE CRYSTALS. Due to concerning for superimposed cellulitis, Dr. Nicholes Stairs put patient on vancomycin and Zosyn. Will give one dose Solu-Medrolof 40 mg x 1 now.  Patient is admitted to telemetry bed as inpatient.  Patient was found to have WBC 8.9, potassium 3.3, bicarbonate 20, creatinine 2.60, BUN 31, temperature 101.5, heart rate in 90s, oxygen satting 93% on room air, chest x-ray of the right hemidiaphragm elevation without infiltration.  Please call manager of [facility] at [phone] when pt arrives to floor   Ivor Costa, MD  [facility] Pager [phone]  If 7PM-7AM, please contact night-coverage [email or web address] Password [facility] 12/16/2017, 4:29 AM

## 2017-12-16 NOTE — Consult Note (Addendum)
Reason for Consult:Knee pain Referring Physician: S Purohit  Shane [name] is an 84 y.o. male.  HPI: Shane [name] developed left knee and right foot pain yesterday. He was seen by Dr. Beane who aspirated the knee. The presumptive dx was gout. While at dialysis he developed a fever and they were concerned for infection and sent the pt to [facility] where he was admitted and transferred to [facility]. Orthopedic surgery was consulted to r/o septic joint. The pt notes his knee pain is slightly improved but still severe. His right foot pain is the same.  Past Medical History:  Diagnosis Date  . Abnormal nuclear cardiac imaging test Nov 2010   moderate area of infarct in the inferior wall with only minimal reversibility and EF of 28%  . AICD (automatic cardioverter/defibrillator) present   . Allergic rhinitis 01-08-13   Uses nebulizer for chronic sinus issues and Mucinex.  . Arthritis    osteoarthritis   . Asthma    Extrinic  . Blood transfusion    ? at time of bypass surgery   . BPH (benign prostatic hyperplasia)   . Cellulitis of arm, left    MSSA  . CHF (congestive heart failure) (HCC)   . CKD (chronic kidney disease), stage III (HCC)    "was told due to meds he takes"-no Renal workup done  . Colon polyps    adenomatous  . Coronary artery disease    remote CABG in 1985, cath in 2003 by Dr. Stuckey with no PCI, last nuclear in 2010 showing scar and EF of 28%.   . Diabetes mellitus   . Diabetic neuropathy (HCC) 04/25/2017  . Dyslipidemia   . Dyspnea   . Gait abnormality 04/25/2017  . Glaucoma 01-08-13   tx. eye drops  . Hemodialysis patient (HCC)   . HOH (hard of hearing)   . HTN (hypertension)   . Hypercholesterolemia   . Left ventricular dysfunction    28% per nuclear in 2010 and 35 to 40% per echo in 2010  . Myocardial infarction (HCC)    1985  . Neuromuscular disorder (HCC)    legs mild paralysis-able to walk"nerve damage"-legs- left leg brace   . Neuropathy   . Pneumonia    hx of several  times years ago   . Spinal stenosis     Past Surgical History:  Procedure Laterality Date  . A/V FISTULAGRAM Right 10/17/2016   Procedure: A/V Fistulagram;  Surgeon: Brian L Chen, MD;  Location: [facility];  Service: Cardiovascular;  Laterality: Right;  . BACK SURGERY     hx of back surgery x 4   . BASCILIC VEIN TRANSPOSITION Right 09/09/2016   Procedure: BASCILIC VEIN TRANSPOSITION Right Arm;  Surgeon: Todd F Early, MD;  Location: [facility] OR;  Service: Vascular;  Laterality: Right;  . BI-VENTRICULAR IMPLANTABLE CARDIOVERTER DEFIBRILLATOR N/A 10/10/2014   Procedure: BI-VENTRICULAR IMPLANTABLE CARDIOVERTER DEFIBRILLATOR  (CRT-D);  Surgeon: Steven C Klein, MD;  Location: [facility] CATH [facility];  Service: Cardiovascular;  Laterality: N/A;  . CARDIAC CATHETERIZATION  2003  . Cataract      recent cataract surgery 6'14  . COLONOSCOPY N/A 01/25/2013   Procedure: COLONOSCOPY;  Surgeon: John N Perry, MD;  Location: [facility];  Service: Endoscopy;  Laterality: N/A;  . CORONARY ARTERY BYPASS GRAFT  1985   x 5 vessels  . LUMBAR DISC SURGERY    . PERIPHERAL VASCULAR BALLOON ANGIOPLASTY Right 10/17/2016   Procedure: Peripheral Vascular Balloon Angioplasty;  Surgeon: Brian L Chen, MD;  Location:   [facility] CV [facility];  Service: Cardiovascular;  Laterality: Right;  ARM VEINOUS AND CENTRAL VEIN  . PR POLYSOM 6/>YRS SLEEP 4/> ADDL PARAM ATTND  12/07/2015  . PROSTATE SURGERY    . TOTAL KNEE ARTHROPLASTY  08/01/2011   Procedure: TOTAL KNEE ARTHROPLASTY;  Surgeon: Johnn Hai;  Location: [facility] ORS;  Service: Orthopedics;  Laterality: Right;    Family History  Problem Relation Age of Onset  . Heart attack Father   . Heart disease Father   . Colon polyps Father   . Lung cancer Mother   . Diabetes Sister        x 2  . Heart disease Brother        x 2  . Bone cancer Brother   . Lung disease Neg Hx     Social History:  reports that he quit smoking about 59 years ago. His smoking use included cigarettes, pipe, and  cigars. He has a 5.00 pack-year smoking history. He has never used smokeless tobacco. He reports that he does not drink alcohol or use drugs.  Allergies:  Allergies  Allergen Reactions  . Codeine Other (See Comments)    anxiety  . Hydrocodone Other (See Comments)    Anxiety- can take in liquid form  . Pseudoephedrine Other (See Comments)    Causes heart to race  . Azithromycin Rash    Medications: I have reviewed the patient's current medications.  Results for orders placed or performed during the hospital encounter of 12/15/17 (from the past 48 hour(s))  I-Stat CG4 Lactic Acid, ED     Status: None   Collection Time: 12/16/17  1:29 AM  Result Value Ref Range   Lactic Acid, Venous 1.14 0.5 - 1.9 mmol/L  CBC with Differential/Platelet     Status: Abnormal   Collection Time: 12/16/17  1:33 AM  Result Value Ref Range   WBC 8.9 4.0 - 10.5 K/uL    Comment: WHITE COUNT CONFIRMED ON SMEAR   RBC 3.68 (L) 4.22 - 5.81 MIL/uL   Hemoglobin 11.0 (L) 13.0 - 17.0 g/dL   HCT 34.1 (L) 39.0 - 52.0 %   MCV 92.7 78.0 - 100.0 fL   MCH 29.9 26.0 - 34.0 pg   MCHC 32.3 30.0 - 36.0 g/dL   RDW 14.9 11.5 - 15.5 %   Platelets  150 - 400 K/uL    PLATELET CLUMPS NOTED ON SMEAR, COUNT APPEARS ADEQUATE   Neutrophils Relative % 74 %   Neutro Abs 6.6 1.7 - 7.7 K/uL   Lymphocytes Relative 13 %   Lymphs Abs 1.2 0.7 - 4.0 K/uL   Monocytes Relative 12 %   Monocytes Absolute 1.1 0.1 - 1.0 K/uL   Eosinophils Relative 1 %   Eosinophils Absolute 0.1 0.0 - 0.7 K/uL   Basophils Relative 0 %   Basophils Absolute 0.0 0.0 - 0.1 K/uL   Smear Review MORPHOLOGY UNREMARKABLE     Comment: Performed at [facility], [address] [place], [place] [address]  Brain natriuretic peptide     Status: Abnormal   Collection Time: 12/16/17  1:33 AM  Result Value Ref Range   B Natriuretic Peptide 681.1 (H) 0.0 - 100.0 pg/mL    Comment: Performed at [facility], [address] [place], [place] [address]   Basic metabolic panel     Status: Abnormal   Collection Time: 12/16/17  2:44 AM  Result Value Ref Range   Sodium 134 (L) 135 -  145 mmol/L   Potassium 3.3 (L) 3.5 - 5.1 mmol/L   Chloride 98 (L) 101 - 111 mmol/L   CO2 26 22 - 32 mmol/L   Glucose, Bld 141 (H) 65 - 99 mg/dL   BUN 31 (H) 6 - 20 mg/dL   Creatinine, Ser 2.60 (H) 0.61 - 1.24 mg/dL   Calcium 7.8 (L) 8.9 - 10.3 mg/dL   GFR calc non Af Amer 21 (L) >60 mL/min   GFR calc Af Amer 25 (L) >60 mL/min    Comment: (NOTE) The eGFR has been calculated using the CKD EPI equation. This calculation has not been validated in all clinical situations. eGFR's persistently <60 mL/min signify possible Chronic Kidney Disease.    Anion gap 10 5 - 15    Comment: Performed at [facility] [address], [address] [place], [place] [address]  Uric acid     Status: Abnormal   Collection Time: 12/16/17  2:44 AM  Result Value Ref Range   Uric Acid, Serum 2.5 (L) 4.4 - 7.6 mg/dL    Comment: Performed at [facility] [address] [place], [place] [address]  Urinalysis, Routine w reflex microscopic     Status: Abnormal   Collection Time: 12/16/17  3:46 AM  Result Value Ref Range   Color, Urine AMBER (A) YELLOW    Comment: BIOCHEMICALS MAY BE AFFECTED BY COLOR   APPearance HAZY (A) CLEAR   Specific Gravity, Urine 1.025 1.005 - 1.030   pH 6.0 5.0 - 8.0   Glucose, UA NEGATIVE NEGATIVE mg/dL   Hgb urine dipstick MODERATE (A) NEGATIVE   Bilirubin Urine SMALL (A) NEGATIVE   Ketones, ur NEGATIVE NEGATIVE mg/dL   Protein, ur >300 (A) NEGATIVE mg/dL   Nitrite NEGATIVE NEGATIVE   Leukocytes, UA NEGATIVE NEGATIVE    Comment: Performed at [facility], [address] [place], [place] [address]  Urinalysis, Microscopic (reflex)     Status: Abnormal   Collection Time: 12/16/17  3:46 AM  Result Value Ref Range   RBC / HPF 0-5 0 - 5 RBC/hpf   WBC, UA 0-5 0 - 5 WBC/hpf   Bacteria, UA MANY (A) NONE SEEN   Squamous Epithelial /  LPF 0-5 0 - 5   Mucus PRESENT    Hyphae Yeast PRESENT    Granular Casts, UA PRESENT     Comment: Performed at [facility], [address] [place], [place] [address]  Glucose, capillary     Status: Abnormal   Collection Time: 12/16/17  8:07 AM  Result Value Ref Range   Glucose-Capillary 130 (H) 65 - 99 mg/dL    Dg Chest 2 View  Result Date: 12/16/2017 CLINICAL DATA:  Acute onset of fever. Bilateral lower extremity swelling. EXAM: CHEST - 2 VIEW COMPARISON:  Chest radiograph performed 08/26/2017 FINDINGS: The lungs are well-aerated. There is mild elevation of the right hemidiaphragm. There is no evidence of focal opacification, pleural effusion or pneumothorax. The heart is borderline normal in size. The patient is status post median sternotomy. A pacemaker/AICD is noted at the left chest wall, with leads ending at the right atrium, right ventricle and coronary sinus. No acute osseous abnormalities are seen. IMPRESSION: Mild elevation of the right hemidiaphragm. Lungs remain grossly clear. Electronically Signed   By: Garald Balding M.D.   On: 12/16/2017 02:56   Dg Foot 2 Views Right  Result Date: 12/16/2017 CLINICAL DATA:  Right foot pain EXAM: RIGHT FOOT -  2 VIEW COMPARISON:  None. FINDINGS: No acute fracture or dislocation is noted. Mild tarsal degenerative changes are seen. No soft tissue changes are noted. IMPRESSION: No acute abnormality seen. Electronically Signed   By: Mark  Lukens M.D.   On: 12/16/2017 09:14    Review of Systems  Constitutional: Negative for weight loss.  HENT: Negative for ear discharge, ear pain, hearing loss and tinnitus.   Eyes: Negative for blurred vision, double vision, photophobia and pain.  Respiratory: Negative for cough, sputum production and shortness of breath.   Cardiovascular: Negative for chest pain.  Gastrointestinal: Negative for abdominal pain, nausea and vomiting.  Genitourinary: Negative for dysuria, flank pain, frequency and urgency.   Musculoskeletal: Positive for joint pain (Left knee and right foot). Negative for back pain, falls, myalgias and neck pain.  Neurological: Negative for dizziness, tingling, sensory change, focal weakness, loss of consciousness and headaches.  Endo/Heme/Allergies: Does not bruise/bleed easily.  Psychiatric/Behavioral: Negative for depression, memory loss and substance abuse. The patient is not nervous/anxious.    Blood pressure (!) 145/65, pulse 78, temperature 97.9 F (36.6 C), temperature source Oral, resp. rate (!) 21, height 5' 10" (1.778 m), weight 87.6 kg (193 lb 3.2 oz), SpO2 96 %. Physical Exam  Constitutional: He appears well-developed and well-nourished. No distress.  HENT:  Head: Normocephalic and atraumatic.  Eyes: Conjunctivae are normal. Right eye exhibits no discharge. Left eye exhibits no discharge. No scleral icterus.  Neck: Normal range of motion.  Cardiovascular: Normal rate and regular rhythm.  Respiratory: Effort normal. No respiratory distress.  Musculoskeletal:  RLE No traumatic wounds, ecchymosis, or rash  Midfoot TTP, no erythema  No knee or ankle effusion  Knee stable to varus/ valgus and anterior/posterior stress  Sens DPN, SPN paresthetic, TN absent  Motor EHL, ext, flex, evers 5/5  DP 2+, PT 2+, No significant edema  LLE No traumatic wounds, ecchymosis, or rash  Knee severe TTP, PROM 180-145  Mod knee effusion, no ankle effusion  Sens DPN, SPN, TN paresthetic  Motor EHL, ext, flex, evers 5/5  DP 1+, PT 1+, No significant edema  Neurological: He is alert.  Skin: Skin is warm and dry. He is not diaphoretic.  Psychiatric: He has a normal mood and affect. His behavior is normal.    Assessment/Plan: Left knee/right foot pain -- I still think this is gout. His ROM and lack of elevated WBC's along with low-grade fevers all point away from septic joint. Recent aspiration showed no organisms and multiple crystals c/w gout and pseudogout. Will reaspirate and  inject with steroid. Would treat as gout flare otherwise. He should f/u with Dr. Beane as OP.    Michael J. Jeffery, PA-C Orthopedic Surgery [phone] 12/16/2017, 10:51 AM  

## 2017-12-16 NOTE — Procedures (Signed)
Procedure: Left knee aspiration and injection  Indication: Left knee effusion(s)  Surgeon: Silvestre Gunner, PA-C  Assist: None  Anesthesia: None  EBL: None  Complications: None  Findings: After risks/benefits explained patient desires to undergo procedure. Consent obtained and time out performed. The left knee was sterilely prepped and aspirated. 15ml clear yellow fluid obtained. 28ml 0.5% Marcaine and 40mg  depomedrol instilled. Pt tolerated the procedure well.    Lisette Abu, PA-C Orthopedic Surgery [phone]

## 2017-12-17 DIAGNOSIS — N184 Chronic kidney disease, stage 4 (severe): Secondary | ICD-10-CM

## 2017-12-17 DIAGNOSIS — I25119 Atherosclerotic heart disease of native coronary artery with unspecified angina pectoris: Secondary | ICD-10-CM

## 2017-12-17 DIAGNOSIS — R509 Fever, unspecified: Secondary | ICD-10-CM

## 2017-12-17 DIAGNOSIS — E876 Hypokalemia: Secondary | ICD-10-CM

## 2017-12-17 DIAGNOSIS — I5042 Chronic combined systolic (congestive) and diastolic (congestive) heart failure: Secondary | ICD-10-CM

## 2017-12-17 DIAGNOSIS — E1122 Type 2 diabetes mellitus with diabetic chronic kidney disease: Secondary | ICD-10-CM

## 2017-12-17 DIAGNOSIS — J4540 Moderate persistent asthma, uncomplicated: Secondary | ICD-10-CM

## 2017-12-17 DIAGNOSIS — I10 Essential (primary) hypertension: Secondary | ICD-10-CM

## 2017-12-17 DIAGNOSIS — Z4502 Encounter for adjustment and management of automatic implantable cardiac defibrillator: Secondary | ICD-10-CM

## 2017-12-17 DIAGNOSIS — N400 Enlarged prostate without lower urinary tract symptoms: Secondary | ICD-10-CM

## 2017-12-17 DIAGNOSIS — I255 Ischemic cardiomyopathy: Secondary | ICD-10-CM

## 2017-12-17 LAB — BASIC METABOLIC PANEL WITH GFR
Anion gap: 13 (ref 5–15)
CO2: 24 mmol/L (ref 22–32)
Glucose, Bld: 190 mg/dL — ABNORMAL HIGH (ref 65–99)
Potassium: 4 mmol/L (ref 3.5–5.1)

## 2017-12-17 LAB — BASIC METABOLIC PANEL
BUN: 46 mg/dL — ABNORMAL HIGH (ref 6–20)
Calcium: 8.4 mg/dL — ABNORMAL LOW (ref 8.9–10.3)
Chloride: 97 mmol/L — ABNORMAL LOW (ref 101–111)
Creatinine, Ser: 3.45 mg/dL — ABNORMAL HIGH (ref 0.61–1.24)
GFR calc Af Amer: 18 mL/min — ABNORMAL LOW (ref >60)
GFR calc non Af Amer: 15 mL/min — ABNORMAL LOW (ref >60)
Sodium: 134 mmol/L — ABNORMAL LOW (ref 135–145)

## 2017-12-17 LAB — CBC
HCT: 29.9 % — ABNORMAL LOW (ref 39.0–52.0)
Hemoglobin: 9.2 g/dL — ABNORMAL LOW (ref 13.0–17.0)
MCH: 28.3 pg (ref 26.0–34.0)
MCHC: 30.8 g/dL (ref 30.0–36.0)
MCV: 92 fL (ref 78.0–100.0)
Platelets: DECREASED 10*3/uL (ref 150–400)
RBC: 3.25 MIL/uL — ABNORMAL LOW (ref 4.22–5.81)
RDW: 14.6 % (ref 11.5–15.5)
WBC: 6.3 K/uL (ref 4.0–10.5)

## 2017-12-17 LAB — URINE CULTURE: Culture: <10000 — AB

## 2017-12-17 LAB — PROCALCITONIN: Procalcitonin: 0.17 ng/mL

## 2017-12-17 LAB — GLUCOSE, CAPILLARY
GLUCOSE-CAPILLARY: 149 mg/dL — AB (ref 65–99)
GLUCOSE-CAPILLARY: 170 mg/dL — AB (ref 65–99)
Glucose-Capillary: 157 mg/dL — ABNORMAL HIGH (ref 65–99)

## 2017-12-17 MED ORDER — SODIUM CHLORIDE 0.9 % IV SOLN
1.0000 g | INTRAVENOUS | Status: DC
  Administered 2017-12-17: 1 g via INTRAVENOUS
  Filled 2017-12-17: qty 10

## 2017-12-17 MED ORDER — PRO-STAT SUGAR FREE PO LIQD
30.0000 mL | Freq: Two times a day (BID) | ORAL | Status: DC
  Administered 2017-12-17 – 2017-12-19 (×5): 30 mL via ORAL
  Filled 2017-12-17 (×4): qty 30

## 2017-12-17 MED ORDER — PREDNISONE 20 MG PO TABS
40.0000 mg | ORAL_TABLET | Freq: Every day | ORAL | Status: AC
  Administered 2017-12-18: 40 mg via ORAL
  Filled 2017-12-17: qty 2

## 2017-12-17 MED ORDER — CALCITRIOL 0.25 MCG PO CAPS
ORAL_CAPSULE | ORAL | Status: AC
  Filled 2017-12-17: qty 1

## 2017-12-17 MED ORDER — RENA-VITE PO TABS
1.0000 | ORAL_TABLET | Freq: Every day | ORAL | Status: DC
  Administered 2017-12-17 – 2017-12-18 (×2): 1 via ORAL
  Filled 2017-12-17 (×2): qty 1

## 2017-12-17 MED ORDER — GABAPENTIN 300 MG PO CAPS
300.0000 mg | ORAL_CAPSULE | Freq: Every day | ORAL | Status: DC
  Administered 2017-12-17 – 2017-12-18 (×2): 300 mg via ORAL
  Filled 2017-12-17 (×2): qty 1

## 2017-12-17 MED ORDER — PREDNISONE 10 MG PO TABS: 10.0000 mg | ORAL_TABLET | Freq: Every day | ORAL | Status: DC

## 2017-12-17 MED ORDER — PREDNISONE 20 MG PO TABS
20.0000 mg | ORAL_TABLET | Freq: Every day | ORAL | Status: DC
  Filled 2017-12-17: qty 1

## 2017-12-17 MED ORDER — PREDNISONE 20 MG PO TABS
30.0000 mg | ORAL_TABLET | Freq: Every day | ORAL | Status: AC
  Administered 2017-12-19: 30 mg via ORAL
  Filled 2017-12-17 (×2): qty 1

## 2017-12-17 MED ORDER — BISACODYL 5 MG PO TBEC
10.0000 mg | DELAYED_RELEASE_TABLET | Freq: Once | ORAL | Status: AC
Start: 2017-12-17 — End: 2017-12-17
  Administered 2017-12-17: 10 mg via ORAL
  Filled 2017-12-17: qty 2

## 2017-12-17 NOTE — Progress Notes (Addendum)
[facility] Progress Note   Subjective: Seen on HD. Says knee feels better. No change in R foot. Was on gabapentin prior to starting HD. Will resume and see if this helps at all. No new issues  Objective Vitals:   12/17/17 0445 12/17/17 0908 12/17/17 0920 12/17/17 0925  BP: (!) 149/79 132/73 126/72 134/68  Pulse: 77 74 72 70  Resp: 18 18 18 18   Temp: 97.7 F (36.5 C) 97.8 F (36.6 C)    TempSrc: Oral Oral    SpO2: 96% 96%    Weight: 87.8 kg (193 lb 8 oz) 88 kg (194 lb 0.1 oz)    Height:       Physical Exam General: Well appearing male in NAD Heart: M7,E7, 2/6 systolic M. No JVD Lungs: CTAB Abdomen: Active BS Extremities: Trace RLE edema.  Musculoskeletal: Decreased swelling noted L knee. R foot still painful metetarsal area of foot-no erythremia or swelling Dialysis Access: RUA AVF cannulated at present   Additional Objective Labs: Basic Metabolic Panel: Recent Labs  Lab 12/16/17 0244 12/17/17 0408  NA 134* 134*  K 3.3* 4.0  CL 98* 97*  CO2 26 24  GLUCOSE 141* 190*  BUN 31* 46*  CREATININE 2.60* 3.45*  CALCIUM 7.8* 8.4*   Liver Function Tests: No results for input(s): AST, ALT, ALKPHOS, BILITOT, PROT, ALBUMIN in the last 168 hours. No results for input(s): LIPASE, AMYLASE in the last 168 hours. CBC: Recent Labs  Lab 12/16/17 0133 12/17/17 0408  WBC 8.9 6.3  NEUTROABS 6.6  --   HGB 11.0* 9.2*  HCT 34.1* 29.9*  MCV 92.7 92.0  PLT PLATELET CLUMPS NOTED ON SMEAR, COUNT APPEARS ADEQUATE PLATELET CLUMPS NOTED ON SMEAR, COUNT APPEARS DECREASED   Blood Culture    Component Value Date/Time   SDES  12/16/2017 0346    URINE, CLEAN CATCH Performed at [facility], [address], [place], [place] [address]    [facility]  12/16/2017 0346    NONE Performed at [facility], [address] [place], [place] [address]    CULT (A) 12/16/2017 0346    <10,000 COLONIES/mL INSIGNIFICANT GROWTH Performed at [facility] [address], [place], [place] [address]    REPTSTATUS 12/17/2017 FINAL 12/16/2017 0346    Cardiac Enzymes: No results for input(s): CKTOTAL, CKMB, CKMBINDEX, TROPONINI in the last 168 hours. CBG: Recent Labs  Lab 12/16/17 0807 12/16/17 1133 12/16/17 1703 12/16/17 2137 12/17/17 0741  GLUCAP 130* 194* 176* 179* 157*   Iron Studies: No results for input(s): IRON, TIBC, TRANSFERRIN, FERRITIN in the last 72 hours. @lablastinr3 @ Studies/Results: Dg Chest 2 View  Result Date: 12/16/2017 CLINICAL DATA:  Acute onset of fever. Bilateral lower extremity swelling. EXAM: CHEST - 2 VIEW COMPARISON:  Chest radiograph performed 08/26/2017 FINDINGS: The lungs are well-aerated. There is mild elevation of the right hemidiaphragm. There is no evidence of focal opacification, pleural effusion or pneumothorax. The heart is borderline normal in size. The patient is status post median sternotomy. A pacemaker/AICD is noted at the left chest wall, with leads ending at the right atrium, right ventricle and coronary sinus. No acute osseous abnormalities are seen. IMPRESSION: Mild elevation of the right hemidiaphragm. Lungs remain grossly clear. Electronically Signed   By: Garald Balding M.D.   On: 12/16/2017 02:56   Dg Foot 2 Views Right  Result Date: 12/16/2017 CLINICAL DATA:  Right foot pain EXAM: RIGHT FOOT - 2 VIEW COMPARISON:  None. FINDINGS: No  acute fracture or dislocation is noted. Mild tarsal degenerative changes are seen. No soft tissue changes are noted. IMPRESSION: No acute abnormality seen. Electronically Signed   By: Inez Catalina M.D.   On: 12/16/2017 09:14   Medications: . sodium chloride    . cefTRIAXone (ROCEPHIN)  IV 1 g (12/17/17 0827)   . amiodarone  200 mg Oral Daily  . amLODipine  5 mg Oral Daily  . aspirin EC  81 mg Oral Daily  . atorvastatin  10 mg Oral Daily  . bupivacaine  10 mL Infiltration Once  . calcitRIOL  0.25 mcg Oral Q M,W,F-HD  . carvedilol  6.25 mg Oral BID WC  .  febuxostat  40 mg Oral Daily  . heparin  5,000 Units Subcutaneous Q8H  . hydrALAZINE  50 mg Oral Q8H  . loratadine  10 mg Oral Daily  . methylPREDNISolone (SOLU-MEDROL) injection  40 mg Intravenous Once  . potassium chloride  20 mEq Oral Once  . sodium chloride flush  3 mL Intravenous Q12H     Dialysis Orders: [facility] MWF 4 hrs 180 NRe 400/800 90.5 kg 3.0 K/2.0 Ca UFP 4 -Heparin 2800 units IV TIW -Mircera 75 mcg IV q 2 weeks (last dose 12/10/2017 Last HGB 10.1 12/10/17) -Venofer 100 mg IV X 10 doses (8/10 doses given Last Fe 38 Tsat 16 12/10/2017) -Calcitriol 0.25 mcg PO TIW  Assessment/Plan: 1.  Fever: Afebrile overnight. BC pending. Started on vanc/Zosyn per primary, now on ceftriaxone. No clear etiology. Synovial fld WBC 16500, Neutrophil 89. Urine Cx neg 2.  Gout/Pseudogout: Has been on colchicine 0.3 mg daily, however pt says he has never taken this medication-says he misplaced drug. Can use 0.6 mg PO X 1 and repeat in 2 weeks if needed. Per primary 3.  ESRD -  MWF. HD tomorrow on schedule. K+ 4.0 use 4.0 K bath, tight heparin.  4.  Hypertension/volume  - UFG 2.5 BP controlled. Adjust EDW upon DC.  5.  Anemia  - HGB 9.2 down from 11.0 12/16/17. Monitor. Hold Fe load D/T possible sepsis.  6.  Metabolic bone disease -  Continue VDRA Ca 7.8 C Ca 9.0  7.  Nutrition - Albumin 2.5. Renal/Carb mod diet, add prostat, renal vit 8.  Systolic HF-does not appear volume overloaded. Monitor. Continue current medications.  9.  H/O VT on amiodarone, has ICD.  37.  DM-per primary  44  CAD H/O CABG  Rita H. Brown NP-C 12/17/2017, 9:59 AM  [facility] [phone]  Pt seen, examined and agree w A/P as above.  Kelly Splinter MD [employer] pager [phone]   12/17/2017, 2:21 PM

## 2017-12-17 NOTE — Progress Notes (Signed)
PROGRESS NOTE    Shane [name]  SWN:[id number] DOB: [birth year] DOA: 12/15/2017 PCP: [name], MD      Brief Narrative:  Shane [name] is a 84 y.o. M with ESRD on HD for a few months, CAD s/p CABG 1985, CHF EF 40%, CRT in place, DM, gout and HTN who presents with fever and knee swelling.   Assessment & Plan:  Gout flare Synovial fluid analysis shows both monosodium urate and calcium pyrophosphate crystals, Gram stain negative, culture negative for 48 hours.  Given steroid injection in the ER last night, symptoms improved today. -Fast prendisone taper -Avoid colchicine -Continue febuxostat   Fever Initial concern was for septic joint, osteomyelitis.  Orthopedics has evaluated the patient, and feel that arthritis is extremely unlikely.  This is confirmed by synovial fluid studies prior to outside facility with~16K WBC only and negative culture at 48 hrs.  UTI is doubted.  Discussed yeast in urine with pharmacy, but in abscence of symptoms (no irritative voiding symptoms at all), we do not feel this represents infection. -Stop antibiotics and monitor for fever  Coronary artery disease status post CABG Hypertension Chronic systolic CHF CRT-D in place History DVT -Continue aspirin, statin -Continue amiodarone, beta-blocker - Continue hydralazine  Diabetes  Diabetic nephropathy -Continue gabapentin     DVT prophylaxis: Heparin Code Status: FULL Family Communication: Noen presnet MDM and disposition Plan: The below labs and imaging reports were reviewed and summarize dabove .  The patient's status is clinically improved.  He was admitted with fever, knee pain.  Aspirate showed gout and pseudogout, and Orthopiedcs feel it is not infection, and in fact injected steroids, which improved his symptoms.  Currently monitoring off antibiotics.  If blood cultures negative at 48 hrs, and no further fever, likely home on Friday.     Consultants:    Nephrology  Orthopedics  Procedures:   Knee aspiration 5/14    Subjective: No more fever.  Redness and swellig completely resolved.  No confusion, weakness.    Objective: Vitals:   12/17/17 1230 12/17/17 1300 12/17/17 1320 12/17/17 1419  BP: 121/61 137/71 128/63 135/65  Pulse: 70 71 72 75  Resp: 18 17 18    Temp:   98.2 F (36.8 C) 98.4 F (36.9 C)  TempSrc:   Oral Oral  SpO2:   98% 96%  Weight:   86 kg (189 lb 9.5 oz)   Height:        Intake/Output Summary (Last 24 hours) at 12/17/2017 1649 Last data filed at 12/17/2017 1320 Gross per 24 hour  Intake 340 ml  Output 2001 ml  Net -1661 ml   Filed Weights   12/17/17 0445 12/17/17 0908 12/17/17 1320  Weight: 87.8 kg (193 lb 8 oz) 88 kg (194 lb 0.1 oz) 86 kg (189 lb 9.5 oz)    Examination: General appearance: Elderly adult male, alert and in no acute distress.   HEENT: Anicteric, conjunctiva pink, lids and lashes normal. No nasal deformity, discharge, epistaxis.  Lips moist, no oral lesions and oP moist.   Skin: Warm and dry.  No jaundice.  No suspicious rashes or lesions.  There is no redness or cellulitis in the left leg, nor the right foot Cardiac: RRR, nl S1-S2, no murmurs appreciated.  Capillary refill is brisk.  JVP normalle.  No LE edema.  Radial pulses 2+ and symmetric. Respiratory: Normal respiratory rate and rhythm.  CTAB without rales or wheezes. Abdomen: Abdomen soft.  No TTP. No ascites, distension, hepatosplenomegaly.  MSK: No deformities or effusion n the large joints of hte upper or lower extremities bilaterally. Neuro: Awake and alert.  EOMI, moves all extremities. Speech fluent.    Psych: Sensorium intact and responding to questions, attention normal. Affect normal.  Judgment and insight appear normal.    Data Reviewed: I have personally reviewed following labs and imaging studies:  CBC: Recent Labs  Lab 12/16/17 0133 12/17/17 0408  WBC 8.9 6.3  NEUTROABS 6.6  --   HGB 11.0* 9.2*  HCT  34.1* 29.9*  MCV 92.7 92.0  PLT PLATELET CLUMPS NOTED ON SMEAR, COUNT APPEARS ADEQUATE PLATELET CLUMPS NOTED ON SMEAR, COUNT APPEARS DECREASED   Basic Metabolic Panel: Recent Labs  Lab 12/16/17 0244 12/17/17 0408  NA 134* 134*  K 3.3* 4.0  CL 98* 97*  CO2 26 24  GLUCOSE 141* 190*  BUN 31* 46*  CREATININE 2.60* 3.45*  CALCIUM 7.8* 8.4*   GFR: Estimated Creatinine Clearance: 17 mL/min (A) (by C-G formula based on SCr of 3.45 mg/dL (H)). Liver Function Tests: No results for input(s): AST, ALT, ALKPHOS, BILITOT, PROT, ALBUMIN in the last 168 hours. No results for input(s): LIPASE, AMYLASE in the last 168 hours. No results for input(s): AMMONIA in the last 168 hours. Coagulation Profile: Recent Labs  Lab 12/16/17 1627  INR 1.20   Cardiac Enzymes: No results for input(s): CKTOTAL, CKMB, CKMBINDEX, TROPONINI in the last 168 hours. BNP (last 3 results) No results for input(s): PROBNP in the last 8760 hours. HbA1C: No results for input(s): HGBA1C in the last 72 hours. CBG: Recent Labs  Lab 12/16/17 1133 12/16/17 1703 12/16/17 2137 12/17/17 0741 12/17/17 1634  GLUCAP 194* 176* 179* 157* 149*   Lipid Profile: No results for input(s): CHOL, HDL, LDLCALC, TRIG, CHOLHDL, LDLDIRECT in the last 72 hours. Thyroid Function Tests: No results for input(s): TSH, T4TOTAL, FREET4, T3FREE, THYROIDAB in the last 72 hours. Anemia Panel: No results for input(s): VITAMINB12, FOLATE, FERRITIN, TIBC, IRON, RETICCTPCT in the last 72 hours. Urine analysis:    Component Value Date/Time   COLORURINE AMBER (A) 12/16/2017 0346   APPEARANCEUR HAZY (A) 12/16/2017 0346   LABSPEC 1.025 12/16/2017 0346   PHURINE 6.0 12/16/2017 0346   GLUCOSEU NEGATIVE 12/16/2017 0346   GLUCOSEU NEGATIVE 06/29/2012 1218   HGBUR MODERATE (A) 12/16/2017 0346   BILIRUBINUR SMALL (A) 12/16/2017 0346   KETONESUR NEGATIVE 12/16/2017 0346   PROTEINUR >300 (A) 12/16/2017 0346   UROBILINOGEN 0.2 10/05/2014 2249    NITRITE NEGATIVE 12/16/2017 0346   LEUKOCYTESUR NEGATIVE 12/16/2017 0346   Sepsis Labs: @LABRCNTIP (procalcitonin:4,lacticacidven:4)  ) Recent Results (from the past 240 hour(s))  Aerobic/Anaerobic Culture (surgical/deep wound)     Status: None (Preliminary result)   Collection Time: 12/15/17  2:00 PM  Result Value Ref Range Status   Specimen Description   Final    SYNOVIAL LEFT KNEE Performed at [facility], [place] [address], [place], [place] [address]    Special Requests   Final    NONE Performed at [facility], [place] [address] [place], [place] [address]    Gram Stain   Final    WBC PRESENT, PREDOMINANTLY PMN NO ORGANISMS SEEN CYTOSPIN SMEAR Gram Stain Report Called to,Read Back By and Verified With: Shanon Ace [id number] @ 6811 BY Martina Sinner Performed at [facility] [address], [place], [place] [address]    Culture   Final    NO GROWTH 2 DAYS NO ANAEROBES ISOLATED; CULTURE IN PROGRESS FOR 5  DAYS Performed at [facility], [place] [address] [place], [place] [address]    Report Status PENDING  Incomplete  Blood culture (routine x 2)     Status: None (Preliminary result)   Collection Time: 12/16/17  1:20 AM  Result Value Ref Range Status   Specimen Description   Final    BLOOD LEFT FOREARM Performed at [facility], [address] [place], [place] [address]    Special Requests   Final    BOTTLES DRAWN AEROBIC AND ANAEROBIC Blood Culture adequate volume Performed at [facility], [address] [place], [place] [address]    Culture   Final    NO GROWTH 1 DAY Performed at [facility], [place] [address] [place], [place] [address]    Report Status PENDING  Incomplete  Blood culture (routine x 2)     Status: None (Preliminary result)   Collection Time: 12/16/17  1:25 AM  Result Value Ref Range Status   Specimen Description   Final    BLOOD LEFT ARM Performed at [facility], [address] [place], [place] [address]    Special Requests   Final    BOTTLES DRAWN AEROBIC AND ANAEROBIC Blood Culture adequate volume Performed at [facility], [address] [place], [place] [address]    Culture   Final    NO GROWTH 1 DAY Performed at [facility], [place] [address], [place], [place] [address]    Report Status PENDING  Incomplete  Urine culture     Status: Abnormal   Collection Time: 12/16/17  3:46 AM  Result Value Ref Range Status   Specimen Description   Final    URINE, CLEAN CATCH Performed at [facility], [address] [place], [place] [address]    Special Requests   Final    NONE Performed at [facility], [address] [place], [place] [address]    Culture (A)  Final    <10,000 COLONIES/mL INSIGNIFICANT GROWTH Performed at [facility], [place] [address] [place], [place] [address]    Report Status 12/17/2017 FINAL  Final         Radiology Studies: Dg Chest 2 View  Result Date: 12/16/2017 CLINICAL DATA:  Acute onset of fever. Bilateral lower extremity swelling. EXAM: CHEST - 2 VIEW COMPARISON:  Chest radiograph performed 08/26/2017 FINDINGS: The lungs are well-aerated. There is mild elevation of the right hemidiaphragm. There is no evidence of focal opacification, pleural effusion or pneumothorax. The heart is borderline normal in size. The patient is status post median sternotomy. A pacemaker/AICD is noted at the left chest wall, with leads ending at the right atrium, right ventricle and coronary sinus. No acute osseous abnormalities are seen. IMPRESSION: Mild elevation of the right hemidiaphragm. Lungs remain grossly clear. Electronically Signed   By: Garald Balding M.D.   On: 12/16/2017 02:56   Dg Foot 2 Views Right  Result Date: 12/16/2017 CLINICAL DATA:  Right foot pain EXAM: RIGHT FOOT - 2 VIEW COMPARISON:  None. FINDINGS: No acute fracture or dislocation is noted. Mild tarsal degenerative changes  are seen. No soft tissue changes are noted. IMPRESSION: No acute abnormality seen. Electronically Signed   By: Inez Catalina M.D.   On: 12/16/2017 09:14        Scheduled Meds: . amiodarone  200 mg Oral Daily  . amLODipine  5  mg Oral Daily  . aspirin EC  81 mg Oral Daily  . atorvastatin  10 mg Oral Daily  . bupivacaine  10 mL Infiltration Once  . calcitRIOL  0.25 mcg Oral Q M,W,F-HD  . carvedilol  6.25 mg Oral BID WC  . febuxostat  40 mg Oral Daily  . feeding supplement (PRO-STAT SUGAR FREE 64)  30 mL Oral BID  . gabapentin  300 mg Oral QHS  . heparin  5,000 Units Subcutaneous Q8H  . hydrALAZINE  50 mg Oral Q8H  . loratadine  10 mg Oral Daily  . methylPREDNISolone (SOLU-MEDROL) injection  40 mg Intravenous Once  . multivitamin  1 tablet Oral QHS  . potassium chloride  20 mEq Oral Once  . sodium chloride flush  3 mL Intravenous Q12H   Continuous Infusions: . sodium chloride       LOS: 1 day    Time spent: 25 minutes    Edwin Dada, MD [facility] 12/17/2017, 4:49 PM     Pager [phone] --- please page though AMION:  [email or web address] If 7PM-7AM, please contact night-coverage

## 2017-12-18 DIAGNOSIS — M10362 Gout due to renal impairment, left knee: Secondary | ICD-10-CM

## 2017-12-18 LAB — RENAL FUNCTION PANEL
Albumin: 2.6 g/dL — ABNORMAL LOW (ref 3.5–5.0)
Anion gap: 15 (ref 5–15)
BUN: 45 mg/dL — AB (ref 6–20)
CO2: 23 mmol/L (ref 22–32)
Calcium: 8.5 mg/dL — ABNORMAL LOW (ref 8.9–10.3)
Chloride: 100 mmol/L — ABNORMAL LOW (ref 101–111)
Creatinine, Ser: 3.32 mg/dL — ABNORMAL HIGH (ref 0.61–1.24)
GFR calc Af Amer: 18 mL/min — ABNORMAL LOW (ref >60)
GFR, EST NON AFRICAN AMERICAN: 16 mL/min — AB (ref >60)
Glucose, Bld: 139 mg/dL — ABNORMAL HIGH (ref 65–99)
POTASSIUM: 4.3 mmol/L (ref 3.5–5.1)
Phosphorus: 4.9 mg/dL — ABNORMAL HIGH (ref 2.5–4.6)
Sodium: 138 mmol/L (ref 135–145)

## 2017-12-18 LAB — CBC
HEMATOCRIT: 31.7 % — AB (ref 39.0–52.0)
Hemoglobin: 9.7 g/dL — ABNORMAL LOW (ref 13.0–17.0)
MCH: 28.8 pg (ref 26.0–34.0)
MCHC: 30.6 g/dL (ref 30.0–36.0)
MCV: 94.1 fL (ref 78.0–100.0)
Platelets: 144 10*3/uL — ABNORMAL LOW (ref 150–400)
RBC: 3.37 MIL/uL — AB (ref 4.22–5.81)
RDW: 15.1 % (ref 11.5–15.5)
WBC: 7.1 10*3/uL (ref 4.0–10.5)

## 2017-12-18 LAB — SYNOVIAL CELL COUNT + DIFF, W/ CRYSTALS
EOSINOPHILS-SYNOVIAL: 0 % (ref 0–1)
LYMPHOCYTES-SYNOVIAL FLD: 0 % (ref 0–20)
MONOCYTE-MACROPHAGE-SYNOVIAL FLUID: 11 % — AB (ref 50–90)
Neutrophil, Synovial: 89 % — ABNORMAL HIGH (ref 0–25)
WBC, Synovial: 16500 /mm3 — ABNORMAL HIGH (ref 0–200)

## 2017-12-18 LAB — GLUCOSE, CAPILLARY
GLUCOSE-CAPILLARY: 246 mg/dL — AB (ref 65–99)
Glucose-Capillary: 125 mg/dL — ABNORMAL HIGH (ref 65–99)
Glucose-Capillary: 185 mg/dL — ABNORMAL HIGH (ref 65–99)

## 2017-12-18 MED ORDER — LACTULOSE 10 GM/15ML PO SOLN
20.0000 g | ORAL | Status: AC
  Administered 2017-12-18 (×2): 20 g via ORAL
  Filled 2017-12-18 (×3): qty 30

## 2017-12-18 MED ORDER — ZOLPIDEM TARTRATE 5 MG PO TABS
5.0000 mg | ORAL_TABLET | Freq: Once | ORAL | Status: AC
  Administered 2017-12-18: 5 mg via ORAL
  Filled 2017-12-18: qty 1

## 2017-12-18 NOTE — Evaluation (Signed)
Physical Therapy Evaluation Patient Details Name: Shane [name] MRN: [id number] DOB: Jan 20, 1934 Today's Date: 12/18/2017   History of Present Illness  Pt is an 84 y/o male admitted secondary to L knee pain. Suspected L knee gout and pt is s/p L knee injection. PMH includes ESRD on HD MWF, CAD s/p CABG, gout, HTN, DM, DVT, asthma, ischemic cardiomyopathy, R AV fistula placement, and R TKA.   Clinical Impression  Pt admitted secondary to problem above with deficits below. Pt with mild unsteadiness and weakness requiring min to min guard A for mobility with RW. Reports wife is available to assist at home as needed. Will continue to follow acutely to maximize functional mobility independence and safety.     Follow Up Recommendations Home health PT;Supervision for mobility/OOB    Equipment Recommendations  None recommended by PT    Recommendations for Other Services       Precautions / Restrictions Precautions Precautions: Fall Restrictions Weight Bearing Restrictions: Yes LLE Weight Bearing: Weight bearing as tolerated      Mobility  Bed Mobility               General bed mobility comments: In chair upon entry   Transfers Overall transfer level: Needs assistance Equipment used: Rolling walker (2 wheeled) Transfers: Sit to/from Stand Sit to Stand: Min assist         General transfer comment: Min A for lift assist and steadying. Demonstrated safe hand placement.   Ambulation/Gait Ambulation/Gait assistance: Min guard Ambulation Distance (Feet): 150 Feet Assistive device: Rolling walker (2 wheeled) Gait Pattern/deviations: Step-through pattern;Decreased stride length Gait velocity: Decreased    General Gait Details: Slow, cautious gait. Some unsteadiness noted, however, no overt LOB.   Stairs            Wheelchair Mobility    Modified Rankin (Stroke Patients Only)       Balance Overall balance assessment: Needs assistance Sitting-balance support:  No upper extremity supported;Feet supported Sitting balance-Leahy Scale: Good     Standing balance support: Bilateral upper extremity supported;During functional activity Standing balance-Leahy Scale: Poor Standing balance comment: Reliant on BUE support                              Pertinent Vitals/Pain Pain Assessment: 0-10 Pain Score: 3  Pain Location: L knee  Pain Descriptors / Indicators: Sore Pain Intervention(s): Limited activity within patient's tolerance;Monitored during session;Repositioned    Home Living Family/patient expects to be discharged to:: Private residence Living Arrangements: Spouse/significant other Available Help at Discharge: Family;Available 24 hours/day Type of Home: House Home Access: Stairs to enter Entrance Stairs-Rails: None Entrance Stairs-Number of Steps: 1(threshold ) Home Layout: One level Home Equipment: Walker - 2 wheels;Cane - single point;Grab bars - tub/shower;Hand held shower head;Shower seat - built in;Wheelchair - manual;Toilet riser      Prior Function Level of Independence: Independent with assistive device(s)         Comments: Reports use of RW for ambulation.      Hand Dominance   Dominant Hand: Right    Extremity/Trunk Assessment   Upper Extremity Assessment Upper Extremity Assessment: Overall WFL for tasks assessed    Lower Extremity Assessment Lower Extremity Assessment: Generalized weakness    Cervical / Trunk Assessment Cervical / Trunk Assessment: Normal  Communication   Communication: No difficulties  Cognition Arousal/Alertness: Awake/alert Behavior During Therapy: WFL for tasks assessed/performed Overall Cognitive Status: Within Functional Limits for tasks assessed  General Comments      Exercises     Assessment/Plan    PT Assessment Patient needs continued PT services  PT Problem List Decreased strength;Decreased  balance;Decreased mobility;Decreased knowledge of use of DME;Pain       PT Treatment Interventions DME instruction;Gait training;Stair training;Functional mobility training;Therapeutic activities;Therapeutic exercise;Balance training;Patient/family education    PT Goals (Current goals can be found in the Care Plan section)  Acute Rehab PT Goals Patient Stated Goal: to go home  PT Goal Formulation: With patient Time For Goal Achievement: 01/01/18 Potential to Achieve Goals: Good    Frequency Min 3X/week   Barriers to discharge        Co-evaluation               AM-PAC PT "6 Clicks" Daily Activity  Outcome Measure Difficulty turning over in bed (including adjusting bedclothes, sheets and blankets)?: A Little Difficulty moving from lying on back to sitting on the side of the bed? : Unable Difficulty sitting down on and standing up from a chair with arms (e.g., wheelchair, bedside commode, etc,.)?: Unable Help needed moving to and from a bed to chair (including a wheelchair)?: A Little Help needed walking in hospital room?: A Little Help needed climbing 3-5 steps with a railing? : A Lot 6 Click Score: 13    End of Session Equipment Utilized During Treatment: Gait belt Activity Tolerance: Patient tolerated treatment well Patient left: in chair;with call bell/phone within reach;with family/visitor present Nurse Communication: Mobility status PT Visit Diagnosis: Unsteadiness on feet (R26.81);Muscle weakness (generalized) (M62.81)    Time: 9357-0177 PT Time Calculation (min) (ACUTE ONLY): 10 min   Charges:   PT Evaluation $PT Eval Low Complexity: 1 Low     PT G Codes:        Leighton Ruff, PT, DPT  Acute Rehabilitation Services  Pager: [phone]   Rudean Hitt 12/18/2017, 5:43 PM

## 2017-12-18 NOTE — Progress Notes (Signed)
PROGRESS NOTE    Shane [name]  QIH:[id number] DOB: July 12, 1934 DOA: 12/15/2017 PCP: [name], MD   Brief Narrative: 84 y.o. M with ESRD on HD for a few months, CAD s/p CABG 1985, CHF EF 40%, CRT in place, DM, gout and HTN who presents with fever and knee swelling.    Assessment & Plan:   Principal Problem:   Gout of left knee Active Problems:   Ischemic cardiomyopathy   Hypercholesterolemia   DM (diabetes mellitus), type 2 with renal complications (HCC)   Osteoarthritis   CAD s/p coronary arthery bypass graft   Chronic kidney disease (CKD), stage IV (severe) (HCC)   H/O Spinal stenosis   Asthma, moderate persistent   Essential hypertension   Severe obstructive sleep apnea   Restrictive lung disease   Benign prostatic hyperplasia without urinary obstruction   Chronic combined systolic and diastolic CHF (congestive heart failure) (HCC)   Peripheral neuropathy   Fever   Chronic gout of left knee   Hypokalemia   ICD (implantable cardioverter-defibrillator) discharge   Ventricular tachycardia (HCC)   Fever Initial concern was for septic joint, osteomyelitis.  Orthopedics has evaluated the patient, and feel that arthritis is extremely unlikely.  This is confirmed by synovial fluid studies prior to outside facility with~16K WBC only and negative culture at 48 hrs.  UTI is doubted.  Discussed yeast in urine with pharmacy, but in abscence of symptoms (no irritative voiding symptoms at all), we do not feel this represents infection. -Stop antibiotics and monitor for fever  Coronary artery disease status post CABG Hypertension Chronic systolic CHF CRT-D in place History DVT -Continue aspirin, statin -Continue amiodarone, beta-blocker - Continue hydralazine  Diabetes  Diabetic nephropathy -Continue gabapentin  -Constipation stool softeners and laxatives as needed.       DVT prophylaxis heparin Code Status: Full code Family Communication: No family  available Disposition Plan: Plan to discharge tomorrow.  Patient requesting discharge tomorrow after dialysis as he is moving from [place] to [place]. Consultants:  Nephrology, Ortho Procedures: Knee aspiration 5/14 Antimicrobials:   Subjective: Resting in bed in no acute distress requesting to go home tomorrow after dialysis.  He does complain of constipation he has not moved his bowels for 7 days.  Objective: Vitals:   12/17/17 1419 12/17/17 1752 12/17/17 1946 12/18/17 0522  BP: 135/65 123/61 (!) 123/59 (!) 141/60  Pulse: 75  70 74  Resp:    18  Temp: 98.4 F (36.9 C)  97.8 F (36.6 C) 97.8 F (36.6 C)  TempSrc: Oral  Oral Oral  SpO2: 96%  92% 96%  Weight:    85 kg (187 lb 8 oz)  Height:        Intake/Output Summary (Last 24 hours) at 12/18/2017 0929 Last data filed at 12/17/2017 1842 Gross per 24 hour  Intake 240 ml  Output 2000 ml  Net -1760 ml   Filed Weights   12/17/17 0908 12/17/17 1320 12/18/17 0522  Weight: 88 kg (194 lb 0.1 oz) 86 kg (189 lb 9.5 oz) 85 kg (187 lb 8 oz)    Examination:  General exam: Appears calm and comfortable  Respiratory system: Clear to auscultation. Respiratory effort normal. Cardiovascular system: S1 & S2 heard, RRR. No JVD, murmurs, rubs, gallops or clicks. No pedal edema. Gastrointestinal system: Abdomen is nondistended, soft and nontender. No organomegaly or masses felt. Normal bowel sounds heard. Central nervous system: Alert and oriented. No focal neurological deficits. Extremities: Symmetric 5 x 5 power. Skin: No rashes,  lesions or ulcers Psychiatry: Judgement and insight appear normal. Mood & affect appropriate.     Data Reviewed: I have personally reviewed following labs and imaging studies  CBC: Recent Labs  Lab 12/16/17 0133 12/17/17 0408 12/18/17 0531  WBC 8.9 6.3 7.1  NEUTROABS 6.6  --   --   HGB 11.0* 9.2* 9.7*  HCT 34.1* 29.9* 31.7*  MCV 92.7 92.0 94.1  PLT PLATELET CLUMPS NOTED ON SMEAR, COUNT APPEARS  ADEQUATE PLATELET CLUMPS NOTED ON SMEAR, COUNT APPEARS DECREASED 093*   Basic Metabolic Panel: Recent Labs  Lab 12/16/17 0244 12/17/17 0408 12/18/17 0531  NA 134* 134* 138  K 3.3* 4.0 4.3  CL 98* 97* 100*  CO2 26 24 23   GLUCOSE 141* 190* 139*  BUN 31* 46* 45*  CREATININE 2.60* 3.45* 3.32*  CALCIUM 7.8* 8.4* 8.5*  PHOS  --   --  4.9*   GFR: Estimated Creatinine Clearance: 17.7 mL/min (A) (by C-G formula based on SCr of 3.32 mg/dL (H)). Liver Function Tests: Recent Labs  Lab 12/18/17 0531  ALBUMIN 2.6*   No results for input(s): LIPASE, AMYLASE in the last 168 hours. No results for input(s): AMMONIA in the last 168 hours. Coagulation Profile: Recent Labs  Lab 12/16/17 1627  INR 1.20   Cardiac Enzymes: No results for input(s): CKTOTAL, CKMB, CKMBINDEX, TROPONINI in the last 168 hours. BNP (last 3 results) No results for input(s): PROBNP in the last 8760 hours. HbA1C: No results for input(s): HGBA1C in the last 72 hours. CBG: Recent Labs  Lab 12/16/17 2137 12/17/17 0741 12/17/17 1634 12/17/17 2140 12/18/17 0742  GLUCAP 179* 157* 149* 170* 125*   Lipid Profile: No results for input(s): CHOL, HDL, LDLCALC, TRIG, CHOLHDL, LDLDIRECT in the last 72 hours. Thyroid Function Tests: No results for input(s): TSH, T4TOTAL, FREET4, T3FREE, THYROIDAB in the last 72 hours. Anemia Panel: No results for input(s): VITAMINB12, FOLATE, FERRITIN, TIBC, IRON, RETICCTPCT in the last 72 hours. Sepsis Labs: Recent Labs  Lab 12/16/17 0129 12/17/17 0408  PROCALCITON  --  0.17  LATICACIDVEN 1.14  --     Recent Results (from the past 240 hour(s))  Aerobic/Anaerobic Culture (surgical/deep wound)     Status: None (Preliminary result)   Collection Time: 12/15/17  2:00 PM  Result Value Ref Range Status   Specimen Description   Final    SYNOVIAL LEFT KNEE Performed at [facility], [place] [address], [place], [place] [address]    Special Requests   Final    NONE Performed at  [facility], [place] [address], [place], [place] [address]    Gram Stain   Final    WBC PRESENT, PREDOMINANTLY PMN NO ORGANISMS SEEN CYTOSPIN SMEAR Gram Stain Report Called to,Read Back By and Verified With: Shanon Ace [id number] @ 8101 BY Martina Sinner Performed at [facility] [address] [place], [place] [address]    Culture   Final    NO GROWTH 2 DAYS NO ANAEROBES ISOLATED; CULTURE IN PROGRESS FOR 5 DAYS Performed at [facility], [place] [address] [place], [place] [address]    Report Status PENDING  Incomplete  Blood culture (routine x 2)     Status: None (Preliminary result)   Collection Time: 12/16/17  1:20 AM  Result Value Ref Range Status   Specimen Description   Final    BLOOD LEFT FOREARM Performed at [facility], [address], [place],  [address]    Special  Requests   Final    BOTTLES DRAWN AEROBIC AND ANAEROBIC Blood Culture adequate volume Performed at [facility], [place] [place], [place] [address]    Culture   Final    NO GROWTH 1 DAY Performed at [facility] [address] [place], [place] [address]    Report Status PENDING  Incomplete  Blood culture (routine x 2)     Status: None (Preliminary result)   Collection Time: 12/16/17  1:25 AM  Result Value Ref Range Status   Specimen Description   Final    BLOOD LEFT ARM Performed at [facility], [place] [place], [place] [address]    Special Requests   Final    BOTTLES DRAWN AEROBIC AND ANAEROBIC Blood Culture adequate volume Performed at [facility], [address], [place], [place] [address]    Culture   Final    NO GROWTH 1 DAY Performed at [facility], [place] [address], [place], [place] [address]    Report Status PENDING  Incomplete  Urine culture     Status: Abnormal   Collection Time: 12/16/17  3:46 AM  Result Value Ref Range Status   Specimen Description   Final    URINE,  CLEAN CATCH Performed at [facility], [place] [place], [place] [address]    Special Requests   Final    NONE Performed at [facility], [place] [place], [place] [address]    Culture (A)  Final    <10,000 COLONIES/mL INSIGNIFICANT GROWTH Performed at [facility], [place] [address], [place], [place] [address]    Report Status 12/17/2017 FINAL  Final         Radiology Studies: No results found.      Scheduled Meds: . amiodarone  200 mg Oral Daily  . amLODipine  5 mg Oral Daily  . aspirin EC  81 mg Oral Daily  . atorvastatin  10 mg Oral Daily  . bupivacaine  10 mL Infiltration Once  . calcitRIOL  0.25 mcg Oral Q M,W,F-HD  . carvedilol  6.25 mg Oral BID WC  . febuxostat  40 mg Oral Daily  . feeding supplement (PRO-STAT SUGAR FREE 64)  30 mL Oral BID  . gabapentin  300 mg Oral QHS  . heparin  5,000 Units Subcutaneous Q8H  . hydrALAZINE  50 mg Oral Q8H  . lactulose  20 g Oral Q2H  . loratadine  10 mg Oral Daily  . methylPREDNISolone (SOLU-MEDROL) injection  40 mg Intravenous Once  . multivitamin  1 tablet Oral QHS  . potassium chloride  20 mEq Oral Once  . [START ON 12/19/2017] predniSONE  30 mg Oral Q breakfast   Followed by  . [START ON 12/20/2017] predniSONE  20 mg Oral Q breakfast   Followed by  . [START ON 12/21/2017] predniSONE  10 mg Oral Q breakfast  . sodium chloride flush  3 mL Intravenous Q12H   Continuous Infusions: . sodium chloride       LOS: 2 days     Georgette Shell, MD [facility]  If 7PM-7AM, please contact night-coverage [email or web address] Password [facility] 12/18/2017, 9:29 AM

## 2017-12-18 NOTE — Plan of Care (Signed)
V paced on Monitor. Assess safety. Stand by assist with ambulation with walker.

## 2017-12-18 NOTE — Progress Notes (Signed)
[facility] Progress Note   Subjective: L knee improved, no new c/o.   Objective Vitals:   12/17/17 1419 12/17/17 1752 12/17/17 1946 12/18/17 0522  BP: 135/65 123/61 (!) 123/59 (!) 141/60  Pulse: 75  70 74  Resp:    18  Temp: 98.4 F (36.9 C)  97.8 F (36.6 C) 97.8 F (36.6 C)  TempSrc: Oral  Oral Oral  SpO2: 96%  92% 96%  Weight:    85 kg (187 lb 8 oz)  Height:       Physical Exam General: Well appearing male in NAD Heart: P1,W2, 2/6 systolic M. No JVD Lungs: CTAB Abdomen: Active BS Extremities: Trace RLE edema.  Musculoskeletal: Decreased swelling noted L knee. R foot still painful metetarsal area of foot-no erythremia or swelling Dialysis Access: RUA AVF    Additional Objective Labs: Basic Metabolic Panel: Recent Labs  Lab 12/16/17 0244 12/17/17 0408 12/18/17 0531  NA 134* 134* 138  K 3.3* 4.0 4.3  CL 98* 97* 100*  CO2 26 24 23   GLUCOSE 141* 190* 139*  BUN 31* 46* 45*  CREATININE 2.60* 3.45* 3.32*  CALCIUM 7.8* 8.4* 8.5*  PHOS  --   --  4.9*   Liver Function Tests: Recent Labs  Lab 12/18/17 0531  ALBUMIN 2.6*   No results for input(s): LIPASE, AMYLASE in the last 168 hours. CBC: Recent Labs  Lab 12/16/17 0133 12/17/17 0408 12/18/17 0531  WBC 8.9 6.3 7.1  NEUTROABS 6.6  --   --   HGB 11.0* 9.2* 9.7*  HCT 34.1* 29.9* 31.7*  MCV 92.7 92.0 94.1  PLT PLATELET CLUMPS NOTED ON SMEAR, COUNT APPEARS ADEQUATE PLATELET CLUMPS NOTED ON SMEAR, COUNT APPEARS DECREASED 144*   Blood Culture    Component Value Date/Time   SDES  12/16/2017 0346    URINE, CLEAN CATCH Performed at [facility], [address], [place], [place] [address]    [facility]  12/16/2017 0346    NONE Performed at [facility], [address] [place], [place] [address]    CULT (A) 12/16/2017 0346    <10,000 COLONIES/mL INSIGNIFICANT GROWTH Performed at [facility] [address] [place], [place] [address]    REPTSTATUS  12/17/2017 FINAL 12/16/2017 0346    Cardiac Enzymes: No results for input(s): CKTOTAL, CKMB, CKMBINDEX, TROPONINI in the last 168 hours. CBG: Recent Labs  Lab 12/17/17 0741 12/17/17 1634 12/17/17 2140 12/18/17 0742 12/18/17 1138  GLUCAP 157* 149* 170* 125* 185*   Iron Studies: No results for input(s): IRON, TIBC, TRANSFERRIN, FERRITIN in the last 72 hours. @lablastinr3 @ Studies/Results: No results found. Medications: . sodium chloride     . amiodarone  200 mg Oral Daily  . amLODipine  5 mg Oral Daily  . aspirin EC  81 mg Oral Daily  . atorvastatin  10 mg Oral Daily  . bupivacaine  10 mL Infiltration Once  . calcitRIOL  0.25 mcg Oral Q M,W,F-HD  . carvedilol  6.25 mg Oral BID WC  . febuxostat  40 mg Oral Daily  . feeding supplement (PRO-STAT SUGAR FREE 64)  30 mL Oral BID  . gabapentin  300 mg Oral QHS  . heparin  5,000 Units Subcutaneous Q8H  . hydrALAZINE  50 mg Oral Q8H  . lactulose  20 g Oral Q2H  . loratadine  10 mg Oral Daily  . methylPREDNISolone (SOLU-MEDROL) injection  40 mg Intravenous Once  . multivitamin  1 tablet Oral QHS  .  potassium chloride  20 mEq Oral Once  . [START ON 12/19/2017] predniSONE  30 mg Oral Q breakfast   Followed by  . [START ON 12/20/2017] predniSONE  20 mg Oral Q breakfast   Followed by  . [START ON 12/21/2017] predniSONE  10 mg Oral Q breakfast  . sodium chloride flush  3 mL Intravenous Q12H     Dialysis Orders: [facility] MWF 4h  90.5kg  3K/2.0Ca bath  p4  Hep 2800  RUA AVF  -Mircera 75 mcg IV q 2 weeks (last dose 12/10/2017 Last HGB 10.1 12/10/17) -Venofer 100 mg IV X 10 doses (8/10 doses given Last Fe 38 Tsat 16 12/10/2017) -Calcitriol 0.25 mcg PO TIW  Assessment/Plan: 1.  Fever/ L knee effusion - felt to be gout and not septic arthritis per primary/ ortho. Abx dc'd.  2.  ESRD -  MWF. HD tomorrow am if still here.  3.  Hypertension/volume  - wt's down to 85kg here (dry wt 90.5kg). Lower at dc 4.  Anemia  - HGB 9.2 down from 11.0  12/16/17  5.  Metabolic bone disease -  Continue VDRA Ca 7.8 C Ca 9.0  6.  Nutrition - Albumin 2.5. Renal/Carb mod diet, add prostat, renal vit 7.  Systolic HF-does not appear volume overloaded 8.  H/O VT on amiodarone, has ICD.  69.  DM-per primary  58  CAD H/O CABG   Kelly Splinter MD [facility] pager [phone]   12/18/2017, 1:19 PM

## 2017-12-18 NOTE — Progress Notes (Signed)
PT Cancellation Note  Patient Details Name: Shane [name] MRN: [id number] DOB: February 11, 1934   Cancelled Treatment:    Reason Eval/Treat Not Completed: Other (comment) Pt just finished walking with mobility tech and requested for PT to come back later in afternoon. Will follow up as schedule allows.   Leighton Ruff, PT, DPT  Acute Rehabilitation Services  Pager: [phone]    Shane [name] 12/18/2017, 10:35 AM

## 2017-12-19 LAB — GLUCOSE, CAPILLARY
GLUCOSE-CAPILLARY: 255 mg/dL — AB (ref 65–99)
GLUCOSE-CAPILLARY: 150 mg/dL — AB (ref 65–99)
Glucose-Capillary: 137 mg/dL — ABNORMAL HIGH (ref 65–99)

## 2017-12-19 LAB — CUP PACEART REMOTE DEVICE CHECK
Battery Remaining Longevity: 33 mo
Battery Voltage: 2.94 V
Brady Statistic AP VP Percent: 0.09 %
Brady Statistic AS VS Percent: 1.99 %
HIGH POWER IMPEDANCE MEASURED VALUE: 65 Ohm
Implantable Lead Implant Date: 20160308
Implantable Lead Location: 753858
Implantable Lead Location: 753859
Implantable Lead Location: 753860
Implantable Lead Model: 4598
Implantable Lead Model: 5076
Implantable Lead Model: 6935
Lead Channel Impedance Value: 323 Ohm
Lead Channel Impedance Value: 380 Ohm
Lead Channel Impedance Value: 456 Ohm
Lead Channel Impedance Value: 570 Ohm
Lead Channel Impedance Value: 665 Ohm
Lead Channel Impedance Value: 722 Ohm
Lead Channel Impedance Value: 722 Ohm
Lead Channel Pacing Threshold Amplitude: 0.875 V
Lead Channel Pacing Threshold Amplitude: 1 V
Lead Channel Pacing Threshold Pulse Width: 0.6 ms
Lead Channel Sensing Intrinsic Amplitude: 2.125 mV
Lead Channel Sensing Intrinsic Amplitude: 2.125 mV
Lead Channel Setting Pacing Amplitude: 2 V
Lead Channel Setting Pacing Amplitude: 4 V
Lead Channel Setting Pacing Pulse Width: 0.8 ms
Lead Channel Setting Sensing Sensitivity: 0.3 mV
MDC IDC LEAD IMPLANT DT: 20160308
MDC IDC LEAD IMPLANT DT: 20160308
MDC IDC MSMT LEADCHNL LV IMPEDANCE VALUE: 285 Ohm
MDC IDC MSMT LEADCHNL LV IMPEDANCE VALUE: 380 Ohm
MDC IDC MSMT LEADCHNL LV IMPEDANCE VALUE: 380 Ohm
MDC IDC MSMT LEADCHNL LV IMPEDANCE VALUE: 456 Ohm
MDC IDC MSMT LEADCHNL LV IMPEDANCE VALUE: 570 Ohm
MDC IDC MSMT LEADCHNL RA IMPEDANCE VALUE: 437 Ohm
MDC IDC MSMT LEADCHNL RA PACING THRESHOLD PULSEWIDTH: 0.4 ms
MDC IDC MSMT LEADCHNL RV PACING THRESHOLD AMPLITUDE: 2.375 V
MDC IDC MSMT LEADCHNL RV PACING THRESHOLD PULSEWIDTH: 0.4 ms
MDC IDC MSMT LEADCHNL RV SENSING INTR AMPL: 6.25 mV
MDC IDC MSMT LEADCHNL RV SENSING INTR AMPL: 6.25 mV
MDC IDC PG IMPLANT DT: 20160308
MDC IDC SESS DTM: 20190425041602
MDC IDC SET LEADCHNL LV PACING PULSEWIDTH: 0.6 ms
MDC IDC SET LEADCHNL RA PACING AMPLITUDE: 2 V
MDC IDC STAT BRADY AP VS PERCENT: 0.02 %
MDC IDC STAT BRADY AS VP PERCENT: 97.9 %
MDC IDC STAT BRADY RA PERCENT PACED: 0.11 %
MDC IDC STAT BRADY RV PERCENT PACED: 0.17 %

## 2017-12-19 LAB — CBC
HCT: 28.5 % — ABNORMAL LOW (ref 39.0–52.0)
HEMOGLOBIN: 8.8 g/dL — AB (ref 13.0–17.0)
MCH: 28.6 pg (ref 26.0–34.0)
MCHC: 30.9 g/dL (ref 30.0–36.0)
MCV: 92.5 fL (ref 78.0–100.0)
Platelets: 123 10*3/uL — ABNORMAL LOW (ref 150–400)
RBC: 3.08 MIL/uL — AB (ref 4.22–5.81)
RDW: 14.5 % (ref 11.5–15.5)
WBC: 8.2 10*3/uL (ref 4.0–10.5)

## 2017-12-19 LAB — RENAL FUNCTION PANEL
ALBUMIN: 2.6 g/dL — AB (ref 3.5–5.0)
ANION GAP: 13 (ref 5–15)
BUN: 77 mg/dL — ABNORMAL HIGH (ref 6–20)
CALCIUM: 8.5 mg/dL — AB (ref 8.9–10.3)
CO2: 25 mmol/L (ref 22–32)
Chloride: 97 mmol/L — ABNORMAL LOW (ref 101–111)
Creatinine, Ser: 4.34 mg/dL — ABNORMAL HIGH (ref 0.61–1.24)
GFR calc non Af Amer: 12 mL/min — ABNORMAL LOW (ref >60)
GFR, EST AFRICAN AMERICAN: 13 mL/min — AB (ref >60)
GLUCOSE: 163 mg/dL — AB (ref 65–99)
PHOSPHORUS: 4.9 mg/dL — AB (ref 2.5–4.6)
Potassium: 3.9 mmol/L (ref 3.5–5.1)
SODIUM: 135 mmol/L (ref 135–145)

## 2017-12-19 MED ORDER — RENA-VITE PO TABS: 1.0000 | ORAL_TABLET | Freq: Every day | ORAL | 0 refills | Status: AC

## 2017-12-19 MED ORDER — HEPARIN SODIUM (PORCINE) 1000 UNIT/ML DIALYSIS
2800.0000 [IU] | Freq: Once | INTRAMUSCULAR | Status: AC
  Administered 2017-12-19: 2800 [IU] via INTRAVENOUS_CENTRAL

## 2017-12-19 MED ORDER — CALCITRIOL 0.25 MCG PO CAPS
ORAL_CAPSULE | ORAL | Status: AC
  Filled 2017-12-19: qty 1

## 2017-12-19 MED ORDER — POLYETHYLENE GLYCOL 3350 17 G PO PACK: 17.0000 g | PACK | Freq: Every day | ORAL | 0 refills | Status: AC | PRN

## 2017-12-19 MED ORDER — PREDNISONE 10 MG PO TABS: 10.0000 mg | ORAL_TABLET | Freq: Every day | ORAL | 0 refills | Status: DC

## 2017-12-19 MED ORDER — GABAPENTIN 300 MG PO CAPS: 300.0000 mg | ORAL_CAPSULE | Freq: Every day | ORAL | 0 refills | Status: DC

## 2017-12-19 MED ORDER — CALCITRIOL 0.25 MCG PO CAPS: 0.2500 ug | ORAL_CAPSULE | ORAL | 0 refills | Status: AC

## 2017-12-19 MED ORDER — PREDNISONE 20 MG PO TABS: 20.0000 mg | ORAL_TABLET | Freq: Every day | ORAL | 0 refills | Status: DC

## 2017-12-19 NOTE — Consult Note (Signed)
   [facility] CM Inpatient Consult   12/19/2017  Shane [name] [birth year] [id number]    Patient screened for potential [facility] Care Management program due to unplanned readmission score of 27% (high).   Spoke with [name] at bedside. Discussed [facility] Care Management program and follow up. He pleasantly declines [facility]. States " I go to diaylsis on Mondays, Wednesdays, and Fridays and to my doctors on Tuesdays and Thursdays. I have pretty good follow up with my physicians".   Discussed recommendation for PT services. He was active with [facility] in the past. He denies needing the additional aide services this time. States he is doing better than he was in the past and is agreeable to home health PT. [name] states he would prefer [facility] for home health PT. States "[name] was good. I liked them".   [name] states his wife now drives again and takes him to MD appointments. Denies having and medication, community case management, and disease management concerns.  Provided [facility] Care Management brochure with contact information and 24-hr nurse advice line magnet to call if needed. [name] expressed appreciation of visit.  Made inpatient RNCM aware of all of above notes.   Marthenia Rolling, MSN-Ed, RN,BSN [facility] Liaison [phone]

## 2017-12-19 NOTE — Discharge Summary (Signed)
Physician Discharge Summary  Shane [name] VFI:[id number] DOB: 1933-09-02 DOA: 12/15/2017  PCP: [name], MD  Admit date: 12/15/2017 Discharge date: 12/19/2017  Admitted From home Disposition: home Recommendations for Outpatient Follow-up:  1. Follow up with PCP in 1-2 weeks 2. Please obtain BMP/CBC in one week  Home Health:yes Equipment/Devices none  Discharge Condition:stable CODE STATUS:full Diet recommendation: renal Brief/Interim Summary:84 y.o.Mwith ESRD on HD for a few months, CAD s/p CABG 1985, CHF EF 40%, CRT in place, DM, gout and HTN who presents with fever and knee swelling.     Discharge Diagnoses:  Principal Problem:   Gout of left knee Active Problems:   Ischemic cardiomyopathy   Hypercholesterolemia   DM (diabetes mellitus), type 2 with renal complications (HCC)   Osteoarthritis   CAD s/p coronary arthery bypass graft   Chronic kidney disease (CKD), stage IV (severe) (HCC)   H/O Spinal stenosis   Asthma, moderate persistent   Essential hypertension   Severe obstructive sleep apnea   Restrictive lung disease   Benign prostatic hyperplasia without urinary obstruction   Chronic combined systolic and diastolic CHF (congestive heart failure) (HCC)   Peripheral neuropathy   Fever   Chronic gout of left knee   Hypokalemia   ICD (implantable cardioverter-defibrillator) discharge   Ventricular tachycardia ([place]) 1] left knee pain-initial concern was for septic joint.  Patient was seen in consultation by Ortho.  Synovial fluid studies did not reveal evidence of septic joint.  He was treated with prednisone taper and febuxostat.  His symptoms improved and resolved.  Initially he was treated with antibiotics thinking it could be septic joint.  2] ESRD on dialysis Monday Wednesday Friday.  3] hypertension continue amiodarone, carvedilol  4] chronic systolic congestive heart failure  5] history of DVT not on any anticoagulation at this time.  6] type 2  diabetes patient carries a history of diabetes but does not take anything at home.  This will need to be followed up as an outpatient.  Obtain hemoglobin A1c  As an outpatient and follow-up with PCP in 2 weeks.  7] history of V. tach on amiodarone has ICD in place.   Discharge Instructions  Discharge Instructions    Diet - low sodium heart healthy   Complete by:  As directed    Increase activity slowly   Complete by:  As directed      Allergies as of 12/19/2017      Reactions   Codeine Other (See Comments)   anxiety   Hydrocodone Other (See Comments)   Anxiety- can take in liquid form   Pseudoephedrine Other (See Comments)   Causes heart to race   Azithromycin Rash      Medication List    STOP taking these medications   acetaminophen 500 MG tablet Commonly known as:  TYLENOL   amLODipine 5 MG tablet Commonly known as:  NORVASC   atorvastatin 10 MG tablet Commonly known as:  LIPITOR   cephALEXin 500 MG capsule Commonly known as:  KEFLEX   colchicine 0.6 MG tablet   hydrALAZINE 50 MG tablet Commonly known as:  APRESOLINE   metolazone 5 MG tablet Commonly known as:  ZAROXOLYN     TAKE these medications   albuterol 108 (90 Base) MCG/ACT inhaler Commonly known as:  PROVENTIL HFA;VENTOLIN HFA Inhale 2 puffs into the lungs every 4 (four) hours as needed for wheezing or shortness of breath (cough, shortness of breath or wheezing.).   amiodarone 200 MG tablet Commonly known  as:  PACERONE Take 1 tablet (200 mg total) by mouth daily. What changed:  when to take this   aspirin EC 81 MG tablet Take 81 mg by mouth daily.   calcitRIOL 0.25 MCG capsule Commonly known as:  ROCALTROL Take 1 capsule (0.25 mcg total) by mouth every Monday, Wednesday, and Friday with hemodialysis.   carvedilol 6.25 MG tablet Commonly known as:  COREG TAKE 1 TABLET BY MOUTH TWICE DAILY WITH A MEAL What changed:  See the new instructions.   cetirizine 10 MG tablet Commonly known as:   ZYRTEC Take 10 mg by mouth at bedtime as needed (seasonal allergies).   dextromethorphan-guaiFENesin 30-600 MG 12hr tablet Commonly known as:  MUCINEX DM Take 1 tablet by mouth 2 (two) times daily as needed for cough.   febuxostat 40 MG tablet Commonly known as:  ULORIC Take 1 tablet (40 mg total) by mouth daily.   fluocinonide cream 0.05 % Commonly known as:  LIDEX Apply 1 application topically 2 (two) times daily as needed (for itchy rash).   gabapentin 300 MG capsule Commonly known as:  NEURONTIN Take 1 capsule (300 mg total) by mouth at bedtime.   lidocaine-prilocaine cream Commonly known as:  EMLA Apply 1 application topically daily as needed for painful procedure/intervention.   multivitamin Tabs tablet Take 1 tablet by mouth at bedtime. What changed:  when to take this   NASACORT AQ 55 MCG/ACT Aero nasal inhaler Generic drug:  triamcinolone Place 2 sprays into the nose daily as needed (seasonal allergies).   polyethylene glycol packet Commonly known as:  MIRALAX / GLYCOLAX Take 17 g by mouth daily as needed for mild constipation.   predniSONE 20 MG tablet Commonly known as:  DELTASONE Take 1 tablet (20 mg total) by mouth daily with breakfast. Start taking on:  12/20/2017   predniSONE 10 MG tablet Commonly known as:  DELTASONE Take 1 tablet (10 mg total) by mouth daily with breakfast. Start taking on:  12/21/2017   tadalafil 20 MG tablet Commonly known as:  CIALIS Take 20 mg by mouth daily as needed for erectile dysfunction. Do not exceed 2 doses in 7 days   testosterone cypionate 100 MG/ML injection Commonly known as:  DEPOTESTOTERONE CYPIONATE Inject 400 mg into the muscle every 21 ( twenty-one) days. For IM use only   zolpidem 10 MG tablet Commonly known as:  AMBIEN TAKE 1/2 TO 1 (ONE-HALF TO ONE) TABLET BY MOUTH AT BEDTIME AS NEEDED FOR SLEEP What changed:  See the new instructions.      Follow-up Information    [name], MD Follow up.    Specialty:  Internal Medicine Contact information: [place] [address] [phone]        Roney Jaffe, MD Follow up.   Specialty:  Nephrology Contact information: [place] [address] [phone]          Allergies  Allergen Reactions  . Codeine Other (See Comments)    anxiety  . Hydrocodone Other (See Comments)    Anxiety- can take in liquid form  . Pseudoephedrine Other (See Comments)    Causes heart to race  . Azithromycin Rash    Consultations:  renal   Procedures/Studies: Dg Chest 2 View  Result Date: 12/16/2017 CLINICAL DATA:  Acute onset of fever. Bilateral lower extremity swelling. EXAM: CHEST - 2 VIEW COMPARISON:  Chest radiograph performed 08/26/2017 FINDINGS: The lungs are well-aerated. There is mild elevation of the right hemidiaphragm. There is no  evidence of focal opacification, pleural effusion or pneumothorax. The heart is borderline normal in size. The patient is status post median sternotomy. A pacemaker/AICD is noted at the left chest wall, with leads ending at the right atrium, right ventricle and coronary sinus. No acute osseous abnormalities are seen. IMPRESSION: Mild elevation of the right hemidiaphragm. Lungs remain grossly clear. Electronically Signed   By: Garald Balding M.D.   On: 12/16/2017 02:56   Dg Foot 2 Views Right  Result Date: 12/16/2017 CLINICAL DATA:  Right foot pain EXAM: RIGHT FOOT - 2 VIEW COMPARISON:  None. FINDINGS: No acute fracture or dislocation is noted. Mild tarsal degenerative changes are seen. No soft tissue changes are noted. IMPRESSION: No acute abnormality seen. Electronically Signed   By: Inez Catalina M.D.   On: 12/16/2017 09:14    (Echo, Carotid, EGD, Colonoscopy, ERCP)    Subjective:   Discharge Exam: Vitals:   12/19/17 1000 12/19/17 1030  BP: 124/67 133/65  Pulse: 67 68  Resp:    Temp:    SpO2:     Vitals:   12/19/17 0900 12/19/17 0930 12/19/17 1000 12/19/17 1030   BP: 127/67 128/73 124/67 133/65  Pulse: 64 71 67 68  Resp: 17 16    Temp:      TempSrc:      SpO2:      Weight:      Height:        General: Pt is alert, awake, not in acute distress Cardiovascular: RRR, S1/S2 +, no rubs, no gallops Respiratory: CTA bilaterally, no wheezing, no rhonchi Abdominal: Soft, NT, ND, bowel sounds + Extremities: no edema, no cyanosis    The results of significant diagnostics from this hospitalization (including imaging, microbiology, ancillary and laboratory) are listed below for reference.     Microbiology: Recent Results (from the past 240 hour(s))  Aerobic/Anaerobic Culture (surgical/deep wound)     Status: None (Preliminary result)   Collection Time: 12/15/17  2:00 PM  Result Value Ref Range Status   Specimen Description   Final    SYNOVIAL LEFT KNEE Performed at [facility], [place] [address], [place], [place] [address]    Special Requests   Final    NONE Performed at [facility], [place] [address] [place], [place] [address]    Gram Stain   Final    WBC PRESENT, PREDOMINANTLY PMN NO ORGANISMS SEEN CYTOSPIN SMEAR Gram Stain Report Called to,Read Back By and Verified With: Shanon Ace [id number] @ 0867 BY Martina Sinner Performed at [place] [address], [place], [place] [address]    Culture   Final    NO GROWTH 3 DAYS NO ANAEROBES ISOLATED; CULTURE IN PROGRESS FOR 5 DAYS Performed at [facility], [place] [address], [place], [place] [address]    Report Status PENDING  Incomplete  Blood culture (routine x 2)     Status: None (Preliminary result)   Collection Time: 12/16/17  1:20 AM  Result Value Ref Range Status   Specimen Description   Final    BLOOD LEFT FOREARM Performed at [facility], [address] [place], [place] [address]    Special Requests   Final    BOTTLES DRAWN AEROBIC AND ANAEROBIC Blood Culture adequate volume Performed at [facility], [address] [place], [place] [address]    Culture   Final    NO GROWTH 2 DAYS Performed at [facility],  [address] [place], [place] [address]    Report Status PENDING  Incomplete  Blood culture (routine x 2)     Status: None (Preliminary result)   Collection Time: 12/16/17  1:25 AM  Result Value Ref Range Status   Specimen Description   Final    BLOOD LEFT ARM Performed at [facility], [address] [place], [place] [address]    Special Requests   Final    BOTTLES DRAWN AEROBIC AND ANAEROBIC Blood Culture adequate volume Performed at [facility], [address] [place], [place] [address]    Culture   Final    NO GROWTH 2 DAYS Performed at [facility], [place] [address] [place], [place] [address]    Report Status PENDING  Incomplete  Urine culture     Status: Abnormal   Collection Time: 12/16/17  3:46 AM  Result Value Ref Range Status   Specimen Description   Final    URINE, CLEAN CATCH Performed at [facility], [place] [place], [place] [address]    Special Requests   Final    NONE Performed at [facility], [address] [place], [place] [address]    Culture (A)  Final    <10,000 COLONIES/mL INSIGNIFICANT GROWTH Performed at [facility], [place] [address], [place], [place] [address]    Report Status 12/17/2017 FINAL  Final     Labs: BNP (last 3 results) Recent Labs    08/22/17 1149 08/27/17 0828 12/16/17 0133  BNP 918.3* 668.3* 740.8*   Basic Metabolic Panel: Recent Labs  Lab 12/16/17 0244 12/17/17 0408 12/18/17 0531 12/19/17 0830  NA 134* 134* 138 135  K 3.3* 4.0 4.3 3.9  CL 98* 97* 100* 97*  CO2 26 24 23 25   GLUCOSE 141* 190* 139* 163*  BUN 31* 46* 45* 77*  CREATININE 2.60* 3.45* 3.32* 4.34*  CALCIUM 7.8* 8.4* 8.5* 8.5*  PHOS  --   --  4.9* 4.9*   Liver Function Tests: Recent Labs  Lab 12/18/17 0531 12/19/17 0830  ALBUMIN 2.6* 2.6*   No results for input(s): LIPASE,  AMYLASE in the last 168 hours. No results for input(s): AMMONIA in the last 168 hours. CBC: Recent Labs  Lab 12/16/17 0133 12/17/17 0408 12/18/17 0531 12/19/17 0830  WBC 8.9 6.3 7.1 8.2  NEUTROABS 6.6  --   --   --   HGB 11.0* 9.2* 9.7* 8.8*  HCT 34.1* 29.9* 31.7* 28.5*  MCV 92.7 92.0 94.1 92.5  PLT PLATELET CLUMPS NOTED ON SMEAR, COUNT APPEARS ADEQUATE PLATELET CLUMPS NOTED ON SMEAR, COUNT APPEARS DECREASED 144* 123*   Cardiac Enzymes: No results for input(s): CKTOTAL, CKMB, CKMBINDEX, TROPONINI in the last 168 hours. BNP: Invalid input(s): POCBNP CBG: Recent Labs  Lab 12/18/17 1138 12/18/17 1625 12/18/17 2108 12/19/17 0728 12/19/17 0838  GLUCAP 185* 246* 255* 150* 137*   D-Dimer No results for input(s): DDIMER in the last 72 hours. Hgb A1c No results for input(s): HGBA1C in the last 72 hours. Lipid Profile No results for input(s): CHOL, HDL, LDLCALC, TRIG, CHOLHDL, LDLDIRECT in the last 72 hours. Thyroid function studies No results for input(s): TSH, T4TOTAL, T3FREE, THYROIDAB in the last 72 hours.  Invalid input(s): FREET3 Anemia work up No results for input(s): VITAMINB12, FOLATE, FERRITIN, TIBC, IRON, RETICCTPCT in the last 72 hours. Urinalysis    Component Value Date/Time   COLORURINE AMBER (A) 12/16/2017 1448  APPEARANCEUR HAZY (A) 12/16/2017 0346   LABSPEC 1.025 12/16/2017 0346   PHURINE 6.0 12/16/2017 0346   GLUCOSEU NEGATIVE 12/16/2017 0346   GLUCOSEU NEGATIVE 06/29/2012 1218   HGBUR MODERATE (A) 12/16/2017 0346   BILIRUBINUR SMALL (A) 12/16/2017 0346   KETONESUR NEGATIVE 12/16/2017 0346   PROTEINUR >300 (A) 12/16/2017 0346   UROBILINOGEN 0.2 10/05/2014 2249   NITRITE NEGATIVE 12/16/2017 0346   LEUKOCYTESUR NEGATIVE 12/16/2017 0346   Sepsis Labs Invalid input(s): PROCALCITONIN,  WBC,  LACTICIDVEN Microbiology Recent Results (from the past 240 hour(s))  Aerobic/Anaerobic Culture (surgical/deep wound)     Status: None (Preliminary result)    Collection Time: 12/15/17  2:00 PM  Result Value Ref Range Status   Specimen Description   Final    SYNOVIAL LEFT KNEE Performed at [facility], [place] [address] [place], [place] [address]    Special Requests   Final    NONE Performed at [facility], [place] [address] [place], [place] [address]    Gram Stain   Final    WBC PRESENT, PREDOMINANTLY PMN NO ORGANISMS SEEN CYTOSPIN SMEAR Gram Stain Report Called to,Read Back By and Verified With: Shanon Ace [id number] @ 8676 BY Martina Sinner Performed at [place] [address], [place], [place] [address]    Culture   Final    NO GROWTH 3 DAYS NO ANAEROBES ISOLATED; CULTURE IN PROGRESS FOR 5 DAYS Performed at [facility], [place] [address], [place], [place] [address]    Report Status PENDING  Incomplete  Blood culture (routine x 2)     Status: None (Preliminary result)   Collection Time: 12/16/17  1:20 AM  Result Value Ref Range Status   Specimen Description   Final    BLOOD LEFT FOREARM Performed at [facility] [place], [address] [place], [place] [address]    Special Requests   Final    BOTTLES DRAWN AEROBIC AND ANAEROBIC Blood Culture adequate volume Performed at [facility], [address] [place], [place] [address]    Culture   Final    NO GROWTH 2 DAYS Performed at [facility], [place] [address], [place], [place] [address]    Report Status PENDING  Incomplete  Blood culture (routine x 2)     Status: None (Preliminary result)   Collection Time: 12/16/17  1:25 AM  Result Value Ref Range Status   Specimen Description   Final    BLOOD LEFT ARM Performed at [facility], [address] [place], [place] [address]    Special Requests   Final    BOTTLES DRAWN AEROBIC AND ANAEROBIC Blood Culture adequate volume Performed at [facility], [address] [place], [place] [address]    Culture   Final    NO GROWTH 2 DAYS Performed  at [facility], [place] [address], [place], [place] [address]    Report Status PENDING  Incomplete  Urine culture     Status: Abnormal   Collection Time: 12/16/17  3:46 AM  Result Value Ref Range Status   Specimen Description   Final    URINE, CLEAN CATCH Performed at [facility], [address] [place],  [address]    Special Requests   Final    NONE Performed at [facility], [address] [place], [place] [address]    Culture (A)  Final    <  10,000 COLONIES/mL INSIGNIFICANT GROWTH Performed at [facility], [place] [address], [place],  [address]    Report Status 12/17/2017 FINAL  Final     Time coordinating discharge:34 minutes  SIGNED:   Georgette Shell, MD  [facility] 12/19/2017, 10:37 AM Pager   If 7PM-7AM, please contact night-coverage [email or web address] Password [email or web address]

## 2017-12-19 NOTE — Care Management Note (Signed)
Case Management Note  Patient Details  Name: SEBERT STOLLINGS MRN: [id number] Date of Birth: Nov 01, 1933  Subjective/Objective: Pt presented for Gout of Left Knee- PTA from home with wife. PT Recommendations for [facility] PT. Pt was previously [facility].                     Action/Plan: Pt would like to utilize [place] again- CM did make referral with [facility] with [place]. SOC to begin within 24-48 hours of transition home. No further needs from CM at this time.   Expected Discharge Date:  12/19/17               Expected Discharge Plan:  [place]  In-House Referral:  NA  Discharge planning Services  CM Consult  Post Acute Care Choice:  Home Health Choice offered to:  Patient  DME Arranged:  N/A DME Agency:  NA  HH Arranged:  PT HH Agency:  [place]  Status of Service:  Completed, signed off  If discussed at [place] of Stay Meetings, dates discussed:    Additional Comments:  Bethena Roys, RN 12/19/2017, 1:18 PM

## 2017-12-19 NOTE — Discharge Instructions (Signed)

## 2017-12-19 NOTE — Progress Notes (Signed)
Discharge instruction was given to pt.  Morton Simson, RN  

## 2017-12-19 NOTE — Progress Notes (Signed)
[facility] Progress Note   Subjective: seen on hd , L knee pain better Objective Vitals:   12/19/17 1100 12/19/17 1130 12/19/17 1200 12/19/17 1214  BP: (!) 139/59 (!) 127/55 (!) 146/70 (!) 143/69  Pulse: 67 66 69 69  Resp:  14 15 16   Temp:    (!) 97.4 F (36.3 C)  TempSrc:    Oral  SpO2:    95%  Weight:    86.6 kg (190 lb 14.7 oz)  Height:       Physical Exam General: Well appearing male in NAD Heart: H8,E9, 2/6 systolic M. No JVD Lungs: CTAB Abdomen: Active BS Extremities: Trace RLE edema.  Musculoskeletal: Decreased swelling noted L knee. No erythremia or swelling Dialysis Access: RUA AVF    Additional Objective Labs: Basic Metabolic Panel: Recent Labs  Lab 12/17/17 0408 12/18/17 0531 12/19/17 0830  NA 134* 138 135  K 4.0 4.3 3.9  CL 97* 100* 97*  CO2 24 23 25   GLUCOSE 190* 139* 163*  BUN 46* 45* 77*  CREATININE 3.45* 3.32* 4.34*  CALCIUM 8.4* 8.5* 8.5*  PHOS  --  4.9* 4.9*   Liver Function Tests: Recent Labs  Lab 12/18/17 0531 12/19/17 0830  ALBUMIN 2.6* 2.6*   No results for input(s): LIPASE, AMYLASE in the last 168 hours. CBC: Recent Labs  Lab 12/16/17 0133 12/17/17 0408 12/18/17 0531 12/19/17 0830  WBC 8.9 6.3 7.1 8.2  NEUTROABS 6.6  --   --   --   HGB 11.0* 9.2* 9.7* 8.8*  HCT 34.1* 29.9* 31.7* 28.5*  MCV 92.7 92.0 94.1 92.5  PLT PLATELET CLUMPS NOTED ON SMEAR, COUNT APPEARS ADEQUATE PLATELET CLUMPS NOTED ON SMEAR, COUNT APPEARS DECREASED 144* 123*   Blood Culture    Component Value Date/Time   SDES  12/16/2017 0346    URINE, CLEAN CATCH Performed at [facility], [address], [place], [place] [address]    [facility]  12/16/2017 0346    NONE Performed at [facility], [address] [place], [place] [address]    CULT (A) 12/16/2017 0346    <10,000 COLONIES/mL INSIGNIFICANT GROWTH Performed at [place] [address], [place], [place] [address]    REPTSTATUS 12/17/2017 FINAL  12/16/2017 0346    Cardiac Enzymes: No results for input(s): CKTOTAL, CKMB, CKMBINDEX, TROPONINI in the last 168 hours. CBG: Recent Labs  Lab 12/18/17 1138 12/18/17 1625 12/18/17 2108 12/19/17 0728 12/19/17 0838  GLUCAP 185* 246* 255* 150* 137*   Iron Studies: No results for input(s): IRON, TIBC, TRANSFERRIN, FERRITIN in the last 72 hours. @lablastinr3 @ Studies/Results: No results found. Medications: . sodium chloride     . amiodarone  200 mg Oral Daily  . amLODipine  5 mg Oral Daily  . aspirin EC  81 mg Oral Daily  . atorvastatin  10 mg Oral Daily  . bupivacaine  10 mL Infiltration Once  . calcitRIOL  0.25 mcg Oral Q M,W,F-HD  . carvedilol  6.25 mg Oral BID WC  . febuxostat  40 mg Oral Daily  . feeding supplement (PRO-STAT SUGAR FREE 64)  30 mL Oral BID  . gabapentin  300 mg Oral QHS  . heparin  5,000 Units Subcutaneous Q8H  . hydrALAZINE  50 mg Oral Q8H  . loratadine  10 mg Oral Daily  . methylPREDNISolone (SOLU-MEDROL) injection  40 mg Intravenous Once  . multivitamin  1 tablet Oral QHS  . potassium chloride  20 mEq Oral Once  . [  START ON 12/20/2017] predniSONE  20 mg Oral Q breakfast   Followed by  . [START ON 12/21/2017] predniSONE  10 mg Oral Q breakfast  . sodium chloride flush  3 mL Intravenous Q12H     Dialysis Orders: [facility] MWF 4h  90.5kg  3K/2.0Ca bath  p4  Hep 2800  RUA AVF  -Mircera 75 mcg IV q 2 weeks (last dose 12/10/2017 Last HGB 10.1 12/10/17) -Venofer 100 mg IV X 10 doses (8/10 doses given Last Fe 38 Tsat 16 12/10/2017) -Calcitriol 0.25 mcg PO TIW  Assessment/Plan: 1.  Fever/ L knee effusion - felt to be gout and not septic arthritis per primary/ ortho. Abx dc'd. For dc home today.  2.  ESRD -  MWF. HD this am.  3.  Hypertension/volume  - wt's down to 85kg here (dry wt 90.5kg). Lower dry wt at dc 4.  Anemia  - HGB 9.2 down from 11.0 12/16/17  5.  Metabolic bone disease -  Continue VDRA Ca 7.8 C Ca 9.0  6.  Nutrition - Albumin 2.5. Renal/Carb mod  diet, add prostat, renal vit 7.  Systolic HF- vol stable on exam 8.  H/O VT on amiodarone, has ICD.  9.  DM-per primary 10.  CAD H/O CABG 11.  Dispo - for [employer] today after HD   Kelly Splinter MD [employer] pager [phone]   12/19/2017, 2:32 PM

## 2017-12-20 DIAGNOSIS — I132 Hypertensive heart and chronic kidney disease with heart failure and with stage 5 chronic kidney disease, or end stage renal disease: Secondary | ICD-10-CM | POA: Diagnosis not present

## 2017-12-20 DIAGNOSIS — M1A0620 Idiopathic chronic gout, left knee, without tophus (tophi): Secondary | ICD-10-CM | POA: Diagnosis not present

## 2017-12-20 LAB — AEROBIC/ANAEROBIC CULTURE (SURGICAL/DEEP WOUND): CULTURE: NO GROWTH

## 2017-12-20 LAB — AEROBIC/ANAEROBIC CULTURE W GRAM STAIN (SURGICAL/DEEP WOUND)

## 2017-12-21 LAB — CULTURE, BLOOD (ROUTINE X 2)
CULTURE: NO GROWTH
Culture: NO GROWTH
SPECIAL REQUESTS: ADEQUATE
Special Requests: ADEQUATE

## 2017-12-22 ENCOUNTER — Telehealth: Payer: Self-pay | Admitting: Internal Medicine

## 2017-12-22 DIAGNOSIS — N186 End stage renal disease: Secondary | ICD-10-CM | POA: Diagnosis not present

## 2017-12-22 DIAGNOSIS — D631 Anemia in chronic kidney disease: Secondary | ICD-10-CM | POA: Diagnosis not present

## 2017-12-22 DIAGNOSIS — Z23 Encounter for immunization: Secondary | ICD-10-CM | POA: Diagnosis not present

## 2017-12-22 DIAGNOSIS — E1129 Type 2 diabetes mellitus with other diabetic kidney complication: Secondary | ICD-10-CM | POA: Diagnosis not present

## 2017-12-22 DIAGNOSIS — N2581 Secondary hyperparathyroidism of renal origin: Secondary | ICD-10-CM | POA: Diagnosis not present

## 2017-12-22 DIAGNOSIS — D509 Iron deficiency anemia, unspecified: Secondary | ICD-10-CM | POA: Diagnosis not present

## 2017-12-22 NOTE — Telephone Encounter (Signed)
LVM with [name] to give verbal orders for PT per MD

## 2017-12-22 NOTE — Telephone Encounter (Signed)
Copied from [place] [phone]. Topic: Quick Communication - See Telephone Encounter >> Dec 22, 2017 12:54 PM Cleaster Corin, NT wrote: CRM for notification. See Telephone encounter for: 12/22/17. [name] calling from [facility] home [facility] to request verbal orders for pt. For PT. For gate and balance training. Sharla Kidney can be reached at [phone] leave vm) 1 week1 2 week 4

## 2017-12-23 DIAGNOSIS — M1A0620 Idiopathic chronic gout, left knee, without tophus (tophi): Secondary | ICD-10-CM | POA: Diagnosis not present

## 2017-12-23 DIAGNOSIS — I132 Hypertensive heart and chronic kidney disease with heart failure and with stage 5 chronic kidney disease, or end stage renal disease: Secondary | ICD-10-CM | POA: Diagnosis not present

## 2017-12-24 DIAGNOSIS — Z23 Encounter for immunization: Secondary | ICD-10-CM | POA: Diagnosis not present

## 2017-12-24 DIAGNOSIS — N2581 Secondary hyperparathyroidism of renal origin: Secondary | ICD-10-CM | POA: Diagnosis not present

## 2017-12-24 DIAGNOSIS — E1129 Type 2 diabetes mellitus with other diabetic kidney complication: Secondary | ICD-10-CM | POA: Diagnosis not present

## 2017-12-24 DIAGNOSIS — D509 Iron deficiency anemia, unspecified: Secondary | ICD-10-CM | POA: Diagnosis not present

## 2017-12-24 DIAGNOSIS — N186 End stage renal disease: Secondary | ICD-10-CM | POA: Diagnosis not present

## 2017-12-24 DIAGNOSIS — D631 Anemia in chronic kidney disease: Secondary | ICD-10-CM | POA: Diagnosis not present

## 2017-12-24 MED ORDER — OMALIZUMAB 150 MG ~~LOC~~ SOLR
300.0000 mg | Freq: Once | SUBCUTANEOUS | Status: AC
  Administered 2017-10-31: 300 mg via SUBCUTANEOUS

## 2017-12-26 DIAGNOSIS — D631 Anemia in chronic kidney disease: Secondary | ICD-10-CM | POA: Diagnosis not present

## 2017-12-26 DIAGNOSIS — E1129 Type 2 diabetes mellitus with other diabetic kidney complication: Secondary | ICD-10-CM | POA: Diagnosis not present

## 2017-12-26 DIAGNOSIS — N186 End stage renal disease: Secondary | ICD-10-CM | POA: Diagnosis not present

## 2017-12-26 DIAGNOSIS — N2581 Secondary hyperparathyroidism of renal origin: Secondary | ICD-10-CM | POA: Diagnosis not present

## 2017-12-26 DIAGNOSIS — Z23 Encounter for immunization: Secondary | ICD-10-CM | POA: Diagnosis not present

## 2017-12-26 DIAGNOSIS — D509 Iron deficiency anemia, unspecified: Secondary | ICD-10-CM | POA: Diagnosis not present

## 2017-12-27 DIAGNOSIS — I132 Hypertensive heart and chronic kidney disease with heart failure and with stage 5 chronic kidney disease, or end stage renal disease: Secondary | ICD-10-CM | POA: Diagnosis not present

## 2017-12-27 DIAGNOSIS — M1A0620 Idiopathic chronic gout, left knee, without tophus (tophi): Secondary | ICD-10-CM | POA: Diagnosis not present

## 2017-12-29 DIAGNOSIS — D631 Anemia in chronic kidney disease: Secondary | ICD-10-CM | POA: Diagnosis not present

## 2017-12-29 DIAGNOSIS — N2581 Secondary hyperparathyroidism of renal origin: Secondary | ICD-10-CM | POA: Diagnosis not present

## 2017-12-29 DIAGNOSIS — D509 Iron deficiency anemia, unspecified: Secondary | ICD-10-CM | POA: Diagnosis not present

## 2017-12-29 DIAGNOSIS — E1129 Type 2 diabetes mellitus with other diabetic kidney complication: Secondary | ICD-10-CM | POA: Diagnosis not present

## 2017-12-29 DIAGNOSIS — N186 End stage renal disease: Secondary | ICD-10-CM | POA: Diagnosis not present

## 2017-12-29 DIAGNOSIS — Z23 Encounter for immunization: Secondary | ICD-10-CM | POA: Diagnosis not present

## 2017-12-30 ENCOUNTER — Encounter: Payer: Self-pay | Admitting: Cardiovascular Disease

## 2017-12-30 ENCOUNTER — Ambulatory Visit (INDEPENDENT_AMBULATORY_CARE_PROVIDER_SITE_OTHER): Payer: Medicare Other | Admitting: Cardiovascular Disease

## 2017-12-30 ENCOUNTER — Ambulatory Visit (INDEPENDENT_AMBULATORY_CARE_PROVIDER_SITE_OTHER): Payer: Self-pay

## 2017-12-30 ENCOUNTER — Telehealth: Payer: Self-pay | Admitting: Cardiovascular Disease

## 2017-12-30 VITALS — BP 118/44 | HR 78 | Ht 70.0 in | Wt 198.0 lb

## 2017-12-30 DIAGNOSIS — I255 Ischemic cardiomyopathy: Secondary | ICD-10-CM | POA: Diagnosis not present

## 2017-12-30 DIAGNOSIS — I5042 Chronic combined systolic (congestive) and diastolic (congestive) heart failure: Secondary | ICD-10-CM

## 2017-12-30 DIAGNOSIS — Z7982 Long term (current) use of aspirin: Secondary | ICD-10-CM

## 2017-12-30 DIAGNOSIS — N400 Enlarged prostate without lower urinary tract symptoms: Secondary | ICD-10-CM | POA: Diagnosis not present

## 2017-12-30 DIAGNOSIS — Z951 Presence of aortocoronary bypass graft: Secondary | ICD-10-CM

## 2017-12-30 DIAGNOSIS — E1122 Type 2 diabetes mellitus with diabetic chronic kidney disease: Secondary | ICD-10-CM | POA: Diagnosis not present

## 2017-12-30 DIAGNOSIS — M4800 Spinal stenosis, site unspecified: Secondary | ICD-10-CM

## 2017-12-30 DIAGNOSIS — Z9181 History of falling: Secondary | ICD-10-CM

## 2017-12-30 DIAGNOSIS — N186 End stage renal disease: Secondary | ICD-10-CM | POA: Diagnosis not present

## 2017-12-30 DIAGNOSIS — J4540 Moderate persistent asthma, uncomplicated: Secondary | ICD-10-CM | POA: Diagnosis not present

## 2017-12-30 DIAGNOSIS — E1140 Type 2 diabetes mellitus with diabetic neuropathy, unspecified: Secondary | ICD-10-CM | POA: Diagnosis not present

## 2017-12-30 DIAGNOSIS — M1991 Primary osteoarthritis, unspecified site: Secondary | ICD-10-CM | POA: Diagnosis not present

## 2017-12-30 DIAGNOSIS — G4733 Obstructive sleep apnea (adult) (pediatric): Secondary | ICD-10-CM | POA: Diagnosis not present

## 2017-12-30 DIAGNOSIS — Z95810 Presence of automatic (implantable) cardiac defibrillator: Secondary | ICD-10-CM

## 2017-12-30 DIAGNOSIS — M1A0620 Idiopathic chronic gout, left knee, without tophus (tophi): Secondary | ICD-10-CM | POA: Diagnosis not present

## 2017-12-30 DIAGNOSIS — I132 Hypertensive heart and chronic kidney disease with heart failure and with stage 5 chronic kidney disease, or end stage renal disease: Secondary | ICD-10-CM | POA: Diagnosis not present

## 2017-12-30 DIAGNOSIS — I2510 Atherosclerotic heart disease of native coronary artery without angina pectoris: Secondary | ICD-10-CM | POA: Diagnosis not present

## 2017-12-30 DIAGNOSIS — Z992 Dependence on renal dialysis: Secondary | ICD-10-CM

## 2017-12-30 DIAGNOSIS — E7800 Pure hypercholesterolemia, unspecified: Secondary | ICD-10-CM

## 2017-12-30 NOTE — Telephone Encounter (Signed)
New Message:        Pt is calling to see if he can drive again

## 2017-12-30 NOTE — Progress Notes (Signed)
Cardiology Office Note   Date:  12/30/2017   ID:  Shane [name], DOB 05-Oct-1933, MRN [id number]  PCP:  [name], MD  Cardiologist:   Mertie Moores, MD   Chief Complaint  Patient presents with  . Congestive Heart Failure   Problem list 1. Coronary artery disease - history of remote coronary artery bypass grafting 2. Chronic systolic congestive heart failure-due to ischemic cardiopathy 3. History of ICD placement - Klein  4. Hyperlipidemia 5. Diabetes mellitus  6. Mild carotid artery disease.    ZHI GEIER is a 84 y.o. male who presents for follow up of his CAD and CHF. Was last seen by Dr. Mare Ferrari several weeks .   No CP , no dyspnea.  Rides an exercise bike.   Has a bad ankle. Has had right knee replacement  - still has trouble with the left knee  Is retired from [employer].   Sold Maalox  Is currently a pastor , reads quite a bit .  Nov. 27, 2017:  Jaryd is also seen in CHF clinic .  His chronic kidney disease. His last creatinine is 2.59 Takes Metalozone about once a week .  Able to get out and do his normal activities.   Gets fatigued easily   June 7,2018: [place] is doing wel Carotid artery scan was stable.   Will repeat in 2 years.  Remains active Has CKD,   Followed by nephrology and Bensimhon   Sept. 24, 2018:  Followed by Bensimhon and Caryl Comes  Has been retaining fluid  Was recently changed to torsemide by Bensimhon  Is going to have a remote interrogation ( Optivol )  Took a metalozone 2 days ago and this am  Did notice some increased urine output   Dec 30, 2017:  Is now on HD ( MWF) since November 21, 2017  Has gout issues .  Breathing has been great snice starting dialysis .  Feels fatigued.   Feels nauseated and "wiped out" every day following dialysis   May need to have knee surgery .     Past Medical History:  Diagnosis Date  . Abnormal nuclear cardiac imaging test Nov 2010   moderate area of infarct in the inferior  wall with only minimal reversibility and EF of 28%  . AICD (automatic cardioverter/defibrillator) present   . Allergic rhinitis 01-08-13   Uses nebulizer for chronic sinus issues and Mucinex.  . Arthritis    osteoarthritis   . Asthma    Extrinic  . Blood transfusion    ? at time of bypass surgery   . BPH (benign prostatic hyperplasia)   . Cellulitis of arm, left    MSSA  . CHF (congestive heart failure) ([place])   . CKD (chronic kidney disease), stage III ([place])    "was told due to meds he takes"-no Renal workup done  . Colon polyps    adenomatous  . Coronary artery disease    remote CABG in 1985, cath in 2003 by Dr. Lia Foyer with no PCI, last nuclear in 2010 showing scar and EF of 28%.   . Diabetes mellitus   . Diabetic neuropathy ([place]) 04/25/2017  . Dyslipidemia   . Dyspnea   . Gait abnormality 04/25/2017  . Glaucoma 01-08-13   tx. eye drops  . Hemodialysis patient ([place])   . HOH (hard of hearing)   . HTN (hypertension)   . Hypercholesterolemia   . Left ventricular dysfunction    28% per nuclear in 2010  and 35 to 40% per echo in 2010  . Myocardial infarction ([place])    1985  . Neuromuscular disorder ([place])    legs mild paralysis-able to walk"nerve damage"-legs- left leg brace   . Neuropathy   . Pneumonia    hx of several times years ago   . Spinal stenosis     Past Surgical History:  Procedure Laterality Date  . A/V FISTULAGRAM Right 10/17/2016   Procedure: A/V Fistulagram;  Surgeon: Conrad Cassville, MD;  Location: [facility];  Service: Cardiovascular;  Laterality: Right;  . BACK SURGERY     hx of back surgery x 4   . BASCILIC VEIN TRANSPOSITION Right 09/09/2016   Procedure: BASCILIC VEIN TRANSPOSITION Right Arm;  Surgeon: Rosetta Posner, MD;  Location: [facility] OR;  Service: Vascular;  Laterality: Right;  . BI-VENTRICULAR IMPLANTABLE CARDIOVERTER DEFIBRILLATOR N/A 10/10/2014   Procedure: BI-VENTRICULAR IMPLANTABLE CARDIOVERTER DEFIBRILLATOR  (CRT-D);  Surgeon: [name], MD;   Location: [facility] C[facility];  Service: Cardiovascular;  Laterality: N/A;  . CARDIAC CATHETERIZATION  2003  . Cataract      recent cataract surgery 6'14  . COLONOSCOPY N/A 01/25/2013   Procedure: COLONOSCOPY;  Surgeon: Irene Shipper, MD;  Location: [facility];  Service: Endoscopy;  Laterality: N/A;  . CORONARY ARTERY BYPASS GRAFT  1985   x 5 vessels  . LUMBAR DISC SURGERY    . PERIPHERAL VASCULAR BALLOON ANGIOPLASTY Right 10/17/2016   Procedure: Peripheral Vascular Balloon Angioplasty;  Surgeon: Conrad , MD;  Location: [facility];  Service: Cardiovascular;  Laterality: Right;  ARM VEINOUS AND CENTRAL VEIN  . PR POLYSOM 6/>YRS SLEEP 4/> ADDL PARAM ATTND  12/07/2015  . PROSTATE SURGERY    . TOTAL KNEE ARTHROPLASTY  08/01/2011   Procedure: TOTAL KNEE ARTHROPLASTY;  Surgeon: Johnn Hai;  Location: [facility] ORS;  Service: Orthopedics;  Laterality: Right;     Current Outpatient Medications  Medication Sig Dispense Refill  . albuterol (PROVENTIL HFA;VENTOLIN HFA) 108 (90 Base) MCG/ACT inhaler Inhale 2 puffs into the lungs every 4 (four) hours as needed for wheezing or shortness of breath (cough, shortness of breath or wheezing.). 1 Inhaler 1  . amiodarone (PACERONE) 200 MG tablet Take 1 tablet (200 mg total) by mouth daily. (Patient taking differently: Take 200 mg by mouth at bedtime. ) 90 tablet 3  . aspirin EC 81 MG tablet Take 81 mg by mouth daily.     . calcitRIOL (ROCALTROL) 0.25 MCG capsule Take 1 capsule (0.25 mcg total) by mouth every Monday, Wednesday, and Friday with hemodialysis. 60 capsule 0  . cetirizine (ZYRTEC) 10 MG tablet Take 10 mg by mouth at bedtime as needed (seasonal allergies).     Marland Kitchen dextromethorphan-guaiFENesin (MUCINEX DM) 30-600 MG 12hr tablet Take 1 tablet by mouth 2 (two) times daily as needed for cough. 20 tablet 0  . febuxostat (ULORIC) 40 MG tablet Take 1 tablet (40 mg total) by mouth daily. 90 tablet 3  . fluocinonide cream (LIDEX) 8.29 % Apply 1 application  topically 2 (two) times daily as needed (for itchy rash).     . gabapentin (NEURONTIN) 300 MG capsule Take 1 capsule (300 mg total) by mouth at bedtime. 30 capsule 0  . lidocaine-prilocaine (EMLA) cream Apply 1 application topically daily as needed for painful procedure/intervention.  11  . multivitamin (RENA-VIT) TABS tablet Take 1 tablet by mouth at bedtime. 60 tablet 0  . polyethylene glycol (MIRALAX / GLYCOLAX) packet Take 17 g by  mouth daily as needed for mild constipation. 14 each 0  . tadalafil (CIALIS) 20 MG tablet Take 20 mg by mouth daily as needed for erectile dysfunction. Do not exceed 2 doses in 7 days    . testosterone cypionate (DEPOTESTOTERONE CYPIONATE) 100 MG/ML injection Inject 400 mg into the muscle every 21 ( twenty-one) days. For IM use only    . triamcinolone (NASACORT AQ) 55 MCG/ACT AERO nasal inhaler Place 2 sprays into the nose daily as needed (seasonal allergies).     . zolpidem (AMBIEN) 10 MG tablet TAKE 1/2 TO 1 (ONE-HALF TO ONE) TABLET BY MOUTH AT BEDTIME AS NEEDED FOR SLEEP (Patient taking differently: Take 5mg  at bedtime, take an additional 5mg  if needed for sleep.) 30 tablet 1   Current Facility-Administered Medications  Medication Dose Route Frequency Provider Last Rate Last Dose  . omalizumab Arvid Right) injection 300 mg  300 mg Subcutaneous Q14 Days Simonne Maffucci B, MD   300 mg at 12/03/17 0756    Allergies:   Codeine; Hydrocodone; Pseudoephedrine; and Azithromycin    Social History:  The patient  reports that he quit smoking about 59 years ago. His smoking use included cigarettes, pipe, and cigars. He has a 5.00 pack-year smoking history. He has never used smokeless tobacco. He reports that he does not drink alcohol or use drugs.   Family History:  The patient's family history includes Bone cancer in his brother; Colon polyps in his father; Diabetes in his sister; Heart attack in his father; Heart disease in his brother and father; Lung cancer in his mother.      ROS:    Noted in current history, otherwise review of systems is negative.   Physical Exam: Blood pressure (!) 118/44, pulse 78, height 5\' 10"  (1.778 m), weight 198 lb (89.8 kg), SpO2 94 %.  GEN:  Well nourished, well developed in no acute distress HEENT: Normal NECK: No JVD; No carotid bruits LYMPHATICS: No lymphadenopathy CARDIAC: RRR  RESPIRATORY:  Clear to auscultation without rales, wheezing or rhonchi  ABDOMEN: Soft, non-tender, non-distended MUSCULOSKELETAL:  No edema; No deformity  SKIN: Warm and dry NEUROLOGIC:  Alert and oriented x 3     EKG:      Recent Labs: 09/26/2017: Magnesium 2.2 10/27/2017: ALT 35; TSH 1.430 12/16/2017: B Natriuretic Peptide 681.1 12/19/2017: BUN 77; Creatinine, Ser 4.34; Hemoglobin 8.8; Platelets 123; Potassium 3.9; Sodium 135    Lipid Panel    Component Value Date/Time   CHOL 104 (L) 09/18/2015 0955   TRIG 97 09/18/2015 0955   HDL 25 (L) 09/18/2015 0955   CHOLHDL 4.2 09/18/2015 0955   VLDL 19 09/18/2015 0955   LDLCALC 60 09/18/2015 0955      Wt Readings from Last 3 Encounters:  12/30/17 198 lb (89.8 kg)  12/19/17 190 lb 14.7 oz (86.6 kg)  10/29/17 204 lb (92.5 kg)      Other studies Reviewed: Additional studies/ records that were reviewed today include: . Review of the above records demonstrates:    ASSESSMENT AND PLAN:  1. Coronary artery disease -    2. Chronic systolic congestive heart failure-due to ischemic cardiopathy - EF 40-45% by echo April, 2017 He is now on hemodialysis.  His breathing is much better.  Continue current hemodialysis schedule.  We will get an echocardiogram for further assessment of his heart function now that he is on dialysis.   3. History of ICD placement - Klein   4. Hyperlipidemia-    .  5. Diabetes mellitus -  6. Mild carotid artery disease.  -     Current medicines are reviewed at length with the patient today.  The patient does not have concerns regarding  medicines.   Disposition:       Mertie Moores, MD  12/30/2017 2:45 PM    [facility] [place], [place],   [address] Phone: [phone]; Fax: [phone]

## 2017-12-30 NOTE — Telephone Encounter (Signed)
Spoke with pt. He had a shock from his ICD Aug 22, 2017. He will not have driving privileges until 02/19/18 as long as he does not have another shock delivered.

## 2017-12-30 NOTE — Patient Instructions (Signed)
Medication Instructions:  Your physician recommends that you continue on your current medications as directed. Please refer to the Current Medication list given to you today.   Labwork: None Ordered   Testing/Procedures: Your physician has requested that you have an echocardiogram. Echocardiography is a painless test that uses sound waves to create images of your heart. It provides your doctor with information about the size and shape of your heart and how well your heart's chambers and valves are working. This procedure takes approximately one hour. There are no restrictions for this procedure.   Follow-Up Your physician wants you to follow-up in: 6 months with Dr. Nahser.  You will receive a reminder letter in the mail two months in advance. If you don't receive a letter, please call our office to schedule the follow-up appointment.   If you need a refill on your cardiac medications before your next appointment, please call your pharmacy.   Thank you for choosing [facility]! Damel Querry, RN [phone]    

## 2017-12-30 NOTE — Progress Notes (Signed)
EPIC Encounter for ICM Monitoring  Patient Name: [name] is a 84 y.o. male Date: 12/30/2017 Primary Care Physican: [name], MD Primary Cardiologist:Nahser/Bensimhon Electrophysiologist: Caryl Comes Nephrologist:[place] Weight:unknown  Bi-V Pacing:97.9%       Spoke with patient. He continues with hemodialysis 3 days at week.  The dialysis makes him tired but he seems to be doing okay.     Thoracic impedance normal.  Recommendations:  Advised since fluid levels are managed by dialysis I would no longer contact him monthly but the device clinic will continue to monitor every 3 months or more often if needed.    Follow-up plan: No further ICM follow up needed due to fluid levels are being managed with dialysis.  Device clinic will continue with remote transmissions every 3 months.   Copy of ICM check sent to Dr. Caryl Comes.   3 month ICM trend: 12/30/2017    1 Year ICM trend:       Rosalene Billings, RN 12/30/2017 10:45 AM

## 2017-12-31 DIAGNOSIS — N186 End stage renal disease: Secondary | ICD-10-CM | POA: Diagnosis not present

## 2017-12-31 DIAGNOSIS — D631 Anemia in chronic kidney disease: Secondary | ICD-10-CM | POA: Diagnosis not present

## 2017-12-31 DIAGNOSIS — E1129 Type 2 diabetes mellitus with other diabetic kidney complication: Secondary | ICD-10-CM | POA: Diagnosis not present

## 2017-12-31 DIAGNOSIS — Z23 Encounter for immunization: Secondary | ICD-10-CM | POA: Diagnosis not present

## 2017-12-31 DIAGNOSIS — N2581 Secondary hyperparathyroidism of renal origin: Secondary | ICD-10-CM | POA: Diagnosis not present

## 2017-12-31 DIAGNOSIS — D509 Iron deficiency anemia, unspecified: Secondary | ICD-10-CM | POA: Diagnosis not present

## 2018-01-01 ENCOUNTER — Other Ambulatory Visit: Payer: Self-pay

## 2018-01-01 ENCOUNTER — Ambulatory Visit (HOSPITAL_COMMUNITY): Payer: Medicare Other | Attending: Cardiovascular Disease

## 2018-01-01 DIAGNOSIS — I350 Nonrheumatic aortic (valve) stenosis: Secondary | ICD-10-CM | POA: Diagnosis not present

## 2018-01-01 DIAGNOSIS — N189 Chronic kidney disease, unspecified: Secondary | ICD-10-CM | POA: Diagnosis not present

## 2018-01-01 DIAGNOSIS — M1A0620 Idiopathic chronic gout, left knee, without tophus (tophi): Secondary | ICD-10-CM | POA: Diagnosis not present

## 2018-01-01 DIAGNOSIS — I130 Hypertensive heart and chronic kidney disease with heart failure and stage 1 through stage 4 chronic kidney disease, or unspecified chronic kidney disease: Secondary | ICD-10-CM | POA: Diagnosis not present

## 2018-01-01 DIAGNOSIS — I252 Old myocardial infarction: Secondary | ICD-10-CM | POA: Insufficient documentation

## 2018-01-01 DIAGNOSIS — Z8249 Family history of ischemic heart disease and other diseases of the circulatory system: Secondary | ICD-10-CM | POA: Diagnosis not present

## 2018-01-01 DIAGNOSIS — Z87891 Personal history of nicotine dependence: Secondary | ICD-10-CM | POA: Diagnosis not present

## 2018-01-01 DIAGNOSIS — I340 Nonrheumatic mitral (valve) insufficiency: Secondary | ICD-10-CM | POA: Diagnosis not present

## 2018-01-01 DIAGNOSIS — I132 Hypertensive heart and chronic kidney disease with heart failure and with stage 5 chronic kidney disease, or end stage renal disease: Secondary | ICD-10-CM | POA: Diagnosis not present

## 2018-01-01 DIAGNOSIS — E785 Hyperlipidemia, unspecified: Secondary | ICD-10-CM | POA: Insufficient documentation

## 2018-01-01 DIAGNOSIS — I255 Ischemic cardiomyopathy: Secondary | ICD-10-CM | POA: Insufficient documentation

## 2018-01-01 DIAGNOSIS — I5042 Chronic combined systolic (congestive) and diastolic (congestive) heart failure: Secondary | ICD-10-CM

## 2018-01-01 DIAGNOSIS — E1122 Type 2 diabetes mellitus with diabetic chronic kidney disease: Secondary | ICD-10-CM | POA: Insufficient documentation

## 2018-01-01 DIAGNOSIS — I2510 Atherosclerotic heart disease of native coronary artery without angina pectoris: Secondary | ICD-10-CM | POA: Insufficient documentation

## 2018-01-02 DIAGNOSIS — N186 End stage renal disease: Secondary | ICD-10-CM | POA: Diagnosis not present

## 2018-01-02 DIAGNOSIS — D509 Iron deficiency anemia, unspecified: Secondary | ICD-10-CM | POA: Diagnosis not present

## 2018-01-02 DIAGNOSIS — D631 Anemia in chronic kidney disease: Secondary | ICD-10-CM | POA: Diagnosis not present

## 2018-01-02 DIAGNOSIS — Z23 Encounter for immunization: Secondary | ICD-10-CM | POA: Diagnosis not present

## 2018-01-02 DIAGNOSIS — N2581 Secondary hyperparathyroidism of renal origin: Secondary | ICD-10-CM | POA: Diagnosis not present

## 2018-01-02 DIAGNOSIS — E1129 Type 2 diabetes mellitus with other diabetic kidney complication: Secondary | ICD-10-CM | POA: Diagnosis not present

## 2018-01-02 DIAGNOSIS — M109 Gout, unspecified: Secondary | ICD-10-CM | POA: Diagnosis not present

## 2018-01-02 NOTE — Progress Notes (Signed)
  Subjective:  Patient ID: Shane [name], male    DOB: Mar 31, 1934,  MRN: [id number]  Chief Complaint  Patient presents with  . Diabetes    nail trim   84 y.o. male returns for diabetic foot care.  Denies new issues here for routine foot care.  Objective:  PAD General AA&O x3. Normal mood and affect.  Vascular Dorsalis pedis pulses present 1+ bilaterally  Posterior tibial pulses absent bilaterally  Capillary refill normal to all digits. Pedal hair growth normal.  Neurologic Epicritic sensation present bilaterally. Protective sensation with 5.07 monofilament  present bilaterally. Vibratory sensation present bilaterally.  Dermatologic No open lesions. Interspaces clear of maceration.  Normal skin temperature and turgor. Hyperkeratotic lesions: None bilaterally. Nails: brittle, onychomycosis, thickening, elongation  Orthopedic: No history of amputation. MMT 5/5 in dorsiflexion, plantarflexion, inversion, and eversion. Normal lower extremity joint ROM without pain or crepitus.   Assessment & Plan:  Patient was evaluated and treated and all questions answered.  Diabetes with PAD, Onychomycosis -Educated on diabetic footcare. Diabetic risk level 1 -At risk foot care provided as below.  Procedure: Nail Debridement Rationale: Patient meets criteria for routine foot care due to class B findings Type of Debridement: manual, sharp debridement. Instrumentation: Nail nipper, rotary burr. Number of Nails: 10  Return in about 2 months (around 02/12/2018) for Diabetic Foot Care.

## 2018-01-03 DIAGNOSIS — I129 Hypertensive chronic kidney disease with stage 1 through stage 4 chronic kidney disease, or unspecified chronic kidney disease: Secondary | ICD-10-CM | POA: Diagnosis not present

## 2018-01-03 DIAGNOSIS — N186 End stage renal disease: Secondary | ICD-10-CM | POA: Diagnosis not present

## 2018-01-03 DIAGNOSIS — D509 Iron deficiency anemia, unspecified: Secondary | ICD-10-CM | POA: Diagnosis not present

## 2018-01-03 DIAGNOSIS — Z992 Dependence on renal dialysis: Secondary | ICD-10-CM | POA: Diagnosis not present

## 2018-01-03 DIAGNOSIS — I132 Hypertensive heart and chronic kidney disease with heart failure and with stage 5 chronic kidney disease, or end stage renal disease: Secondary | ICD-10-CM | POA: Diagnosis not present

## 2018-01-03 DIAGNOSIS — D631 Anemia in chronic kidney disease: Secondary | ICD-10-CM | POA: Diagnosis not present

## 2018-01-03 DIAGNOSIS — Z23 Encounter for immunization: Secondary | ICD-10-CM | POA: Diagnosis not present

## 2018-01-03 DIAGNOSIS — M1A0620 Idiopathic chronic gout, left knee, without tophus (tophi): Secondary | ICD-10-CM | POA: Diagnosis not present

## 2018-01-03 DIAGNOSIS — E1129 Type 2 diabetes mellitus with other diabetic kidney complication: Secondary | ICD-10-CM | POA: Diagnosis not present

## 2018-01-03 DIAGNOSIS — N2581 Secondary hyperparathyroidism of renal origin: Secondary | ICD-10-CM | POA: Diagnosis not present

## 2018-01-05 DIAGNOSIS — D631 Anemia in chronic kidney disease: Secondary | ICD-10-CM | POA: Diagnosis not present

## 2018-01-05 DIAGNOSIS — N186 End stage renal disease: Secondary | ICD-10-CM | POA: Diagnosis not present

## 2018-01-05 DIAGNOSIS — E1129 Type 2 diabetes mellitus with other diabetic kidney complication: Secondary | ICD-10-CM | POA: Diagnosis not present

## 2018-01-05 DIAGNOSIS — N2581 Secondary hyperparathyroidism of renal origin: Secondary | ICD-10-CM | POA: Diagnosis not present

## 2018-01-05 DIAGNOSIS — Z23 Encounter for immunization: Secondary | ICD-10-CM | POA: Diagnosis not present

## 2018-01-05 DIAGNOSIS — D509 Iron deficiency anemia, unspecified: Secondary | ICD-10-CM | POA: Diagnosis not present

## 2018-01-05 NOTE — Progress Notes (Signed)
Subjective:    Patient ID: Shane [name], male    DOB: May 14, 1934, 84 y.o.   MRN: [id number]  HPI The patient is here for an acute visit.  He was seen in the hospital a few weeks ago for right foot pain and left knee pain.  Initially they were unsure if his knee was septic or gout.  And looks like ultimately it was gout.  Fluid was taken from the knee and there was uric acid.  Culture did not show any bacteria.  He received a steroid injection and a steroid taper and currently is pain-free, except for his chronic arthritis pain.  His right foot was more likely an infection.  He was treated with doxycycline for cellulitis.  After completing the course there was improvement.  The redness recurred and he was started back on doxycycline by his nephrologist.  He is taking that and there is improvement.  They do monitor it closely when he goes for dialysis.  He wants to avoid future episodes of gout.  He is taking the Uloric 40 mg daily as prescribed.  He has still had gout flares.  He wonders about switching to allopurinol if this would work better.  He is not discussed his gout with his nephrologist.  He denies any fevers or chills.   Medications and allergies reviewed with patient and updated if appropriate.  Patient Active Problem List   Diagnosis Date Noted  . Gout of left knee 12/16/2017  . Fatigue 09/08/2017  . Hypokalemia 08/22/2017  . Elevated troponin 08/22/2017  . ICD (implantable cardioverter-defibrillator) discharge 08/22/2017  . Ventricular tachycardia ([place]) 08/22/2017  . Chronic gout of left knee 07/21/2017  . Fever 05/28/2017  . Diabetic neuropathy ([place]) 04/25/2017  . Gait abnormality 04/25/2017  . Numbness and tingling of both feet 03/04/2017  . Peripheral neuropathy 03/04/2017  . Carotid artery disease ([place]) 01/09/2017  . Chronic combined systolic and diastolic CHF (congestive heart failure) ([place]) 07/01/2016  . Restrictive lung disease 01/22/2016  . Severe  obstructive sleep apnea 12/17/2015  . Essential hypertension 11/16/2015  . 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 LBBB (left bundle branch block) 10/10/2014  . Syncope 10/06/2014  . Pain in thumb joint with movement of right hand 01/04/2014  . Sinusitis, chronic 07/16/2013  . Asthma, moderate persistent 07/08/2013  . Benign prostatic hyperplasia without urinary obstruction 12/18/2011  . CAD s/p coronary arthery bypass graft   . Chronic kidney disease (CKD), stage IV (severe) ([place])   . H/O Spinal stenosis   . DM (diabetes mellitus), type 2 with renal complications ([place]) 96/75/9163  . Benign hypertensive heart disease without heart failure 11/07/2010  . Allergic rhinitis 11/07/2010  . Osteoarthritis 11/07/2010  . Ischemic cardiomyopathy   . Hypercholesterolemia     Current Outpatient Medications on File Prior to Visit  Medication Sig Dispense Refill  . albuterol (PROVENTIL HFA;VENTOLIN HFA) 108 (90 Base) MCG/ACT inhaler Inhale 2 puffs into the lungs every 4 (four) hours as needed for wheezing or shortness of breath (cough, shortness of breath or wheezing.). 1 Inhaler 1  . amiodarone (PACERONE) 200 MG tablet Take 1 tablet (200 mg total) by mouth daily. (Patient taking differently: Take 200 mg by mouth at bedtime. ) 90 tablet 3  . aspirin EC 81 MG tablet Take 81 mg by mouth daily.     Marland Kitchen atorvastatin (LIPITOR) 10 MG tablet Take 10 mg by mouth daily.    . calcitRIOL (ROCALTROL) 0.25 MCG capsule Take 1 capsule (0.25 mcg total) by  mouth every Monday, Wednesday, and Friday with hemodialysis. 60 capsule 0  . cetirizine (ZYRTEC) 10 MG tablet Take 10 mg by mouth at bedtime as needed (seasonal allergies).     Marland Kitchen dextromethorphan-guaiFENesin (MUCINEX DM) 30-600 MG 12hr tablet Take 1 tablet by mouth 2 (two) times daily as needed for cough. 20 tablet 0  . febuxostat (ULORIC) 40 MG tablet Take 1 tablet (40 mg total) by mouth daily. 90 tablet 3  . fluocinonide cream (LIDEX) 9.77 % Apply 1 application topically 2  (two) times daily as needed (for itchy rash).     . gabapentin (NEURONTIN) 300 MG capsule Take 1 capsule (300 mg total) by mouth at bedtime. 30 capsule 0  . lidocaine-prilocaine (EMLA) cream Apply 1 application topically daily as needed for painful procedure/intervention.  11  . multivitamin (RENA-VIT) TABS tablet Take 1 tablet by mouth at bedtime. 60 tablet 0  . polyethylene glycol (MIRALAX / GLYCOLAX) packet Take 17 g by mouth daily as needed for mild constipation. 14 each 0  . tadalafil (CIALIS) 20 MG tablet Take 20 mg by mouth daily as needed for erectile dysfunction. Do not exceed 2 doses in 7 days    . testosterone cypionate (DEPOTESTOTERONE CYPIONATE) 100 MG/ML injection Inject 400 mg into the muscle every 21 ( twenty-one) days. For IM use only    . triamcinolone (NASACORT AQ) 55 MCG/ACT AERO nasal inhaler Place 2 sprays into the nose daily as needed (seasonal allergies).     . zolpidem (AMBIEN) 10 MG tablet TAKE 1/2 TO 1 (ONE-HALF TO ONE) TABLET BY MOUTH AT BEDTIME AS NEEDED FOR SLEEP (Patient taking differently: Take 5mg  at bedtime, take an additional 5mg  if needed for sleep.) 30 tablet 1   Current Facility-Administered Medications on File Prior to Visit  Medication Dose Route Frequency Provider Last Rate Last Dose  . omalizumab Arvid Right) injection 300 mg  300 mg Subcutaneous Q14 Days Juanito Doom, MD   300 mg at 12/03/17 4142    Past Medical History:  Diagnosis Date  . Abnormal nuclear cardiac imaging test Nov 2010   moderate area of infarct in the inferior wall with only minimal reversibility and EF of 28%  . AICD (automatic cardioverter/defibrillator) present   . Allergic rhinitis 01-08-13   Uses nebulizer for chronic sinus issues and Mucinex.  . Arthritis    osteoarthritis   . Asthma    Extrinic  . Blood transfusion    ? at time of bypass surgery   . BPH (benign prostatic hyperplasia)   . Cellulitis of arm, left    MSSA  . CHF (congestive heart failure) ([place])   . CKD  (chronic kidney disease), stage III ([place])    "was told due to meds he takes"-no Renal workup done  . Colon polyps    adenomatous  . Coronary artery disease    remote CABG in 1985, cath in 2003 by Dr. Lia Foyer with no PCI, last nuclear in 2010 showing scar and EF of 28%.   . Diabetes mellitus   . Diabetic neuropathy ([place]) 04/25/2017  . Dyslipidemia   . Dyspnea   . Gait abnormality 04/25/2017  . Glaucoma 01-08-13   tx. eye drops  . Hemodialysis patient ([place])   . HOH (hard of hearing)   . HTN (hypertension)   . Hypercholesterolemia   . Left ventricular dysfunction    28% per nuclear in 2010 and 35 to 40% per echo in 2010  . Myocardial infarction ([place])    1985  .  Neuromuscular disorder ([place])    legs mild paralysis-able to walk"nerve damage"-legs- left leg brace   . Neuropathy   . Pneumonia    hx of several times years ago   . Spinal stenosis     Past Surgical History:  Procedure Laterality Date  . A/V FISTULAGRAM Right 10/17/2016   Procedure: A/V Fistulagram;  Surgeon: Conrad [place], MD;  Location: [facility];  Service: Cardiovascular;  Laterality: Right;  . BACK SURGERY     hx of back surgery x 4   . BASCILIC VEIN TRANSPOSITION Right 09/09/2016   Procedure: BASCILIC VEIN TRANSPOSITION Right Arm;  Surgeon: Rosetta Posner, MD;  Location: [facility] OR;  Service: Vascular;  Laterality: Right;  . BI-VENTRICULAR IMPLANTABLE CARDIOVERTER DEFIBRILLATOR N/A 10/10/2014   Procedure: BI-VENTRICULAR IMPLANTABLE CARDIOVERTER DEFIBRILLATOR  (CRT-D);  Surgeon: [name], MD;  Location: [facility] C[facility] LAB;  Service: Cardiovascular;  Laterality: N/A;  . CARDIAC CATHETERIZATION  2003  . Cataract      recent cataract surgery 6'14  . COLONOSCOPY N/A 01/25/2013   Procedure: COLONOSCOPY;  Surgeon: Irene Shipper, MD;  Location: [facility] ENDOSCOPY;  Service: Endoscopy;  Laterality: N/A;  . CORONARY ARTERY BYPASS GRAFT  1985   x 5 vessels  . LUMBAR DISC SURGERY    . PERIPHERAL VASCULAR BALLOON ANGIOPLASTY Right  10/17/2016   Procedure: Peripheral Vascular Balloon Angioplasty;  Surgeon: Conrad , MD;  Location: [facility];  Service: Cardiovascular;  Laterality: Right;  ARM VEINOUS AND CENTRAL VEIN  . PR POLYSOM 6/>YRS SLEEP 4/> ADDL PARAM ATTND  12/07/2015  . PROSTATE SURGERY    . TOTAL KNEE ARTHROPLASTY  08/01/2011   Procedure: TOTAL KNEE ARTHROPLASTY;  Surgeon: Johnn Hai;  Location: [facility] ORS;  Service: Orthopedics;  Laterality: Right;    Social History   Socioeconomic History  . Marital status: Married    Spouse name: Not on file  . Number of children: 2  . Years of education: Not on file  . Highest education level: Not on file  Occupational History  . Occupation: Theme park manager  Social Needs  . Financial resource strain: Not on file  . Food insecurity:    Worry: Not on file    Inability: Not on file  . Transportation needs:    Medical: Not on file    Non-medical: Not on file  Tobacco Use  . Smoking status: Former Smoker    Packs/day: 1.00    Years: 5.00    Pack years: 5.00    Types: Cigarettes, Pipe, Cigars    Last attempt to quit: 08/05/1958    Years since quitting: 59.4  . Smokeless tobacco: Never Used  Substance and Sexual Activity  . Alcohol use: No  . Drug use: No  . Sexual activity: Not on file  Lifestyle  . Physical activity:    Days per week: Not on file    Minutes per session: Not on file  . Stress: Not on file  Relationships  . Social connections:    Talks on phone: Not on file    Gets together: Not on file    Attends religious service: Not on file    Active member of club or organization: Not on file    Attends meetings of clubs or organizations: Not on file    Relationship status: Not on file  Other Topics Concern  . Not on file  Social History Narrative   Originally from [place]. He has always lived in [place]. Prior travel to  [place], [place], [place], [place] ,[place], & [place]. 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 Has adopted children.  Has a dog currently. No bird exposure. No mold exposure. Enjoys reading & traveling.    Family History  Problem Relation Age of Onset  . Heart attack Father   . Heart disease Father   . Colon polyps Father   . Lung cancer Mother   . Diabetes Sister        x 2  . Heart disease Brother        x 2  . Bone cancer Brother   . Lung disease Neg Hx     Review of Systems  Constitutional: Negative for chills and fever.  Musculoskeletal: Positive for arthralgias and joint swelling (Minimal swelling left knee).  Skin:       No left knee erythema, warmth or pain with palpation       Objective:   Vitals:   01/06/18 1559  BP: (!) 124/50  Pulse: 87  Resp: 16  Temp: 97.8 F (36.6 C)  SpO2: 95%   BP Readings from Last 3 Encounters:  01/06/18 (!) 124/50  12/30/17 (!) 118/44  12/19/17 (!) 143/69   Wt Readings from Last 3 Encounters:  01/06/18 196 lb (88.9 kg)  12/30/17 198 lb (89.8 kg)  12/19/17 190 lb 14.7 oz (86.6 kg)   Body mass index is 28.12 kg/m.   Physical Exam  Constitutional: He appears well-developed and well-nourished. No distress.  HENT:  Head: Normocephalic and atraumatic.  Musculoskeletal:  Left knee with minimal swelling-appears to be related to chronic osteoarthritis, left knee nontender to palpation, no warmth, no erythema  Skin: Skin is warm and dry. He is not diaphoretic. No erythema.  Deferred foot exam-nephrology checks 3 times a week and infection improving per patient           Assessment & Plan:    See Problem List for Assessment and Plan of chronic medical problems.

## 2018-01-06 ENCOUNTER — Ambulatory Visit (INDEPENDENT_AMBULATORY_CARE_PROVIDER_SITE_OTHER): Payer: Medicare Other | Admitting: Internal Medicine

## 2018-01-06 ENCOUNTER — Encounter: Payer: Self-pay | Admitting: Internal Medicine

## 2018-01-06 VITALS — BP 124/50 | HR 87 | Temp 97.8°F | Resp 16 | Wt 196.0 lb

## 2018-01-06 DIAGNOSIS — I132 Hypertensive heart and chronic kidney disease with heart failure and with stage 5 chronic kidney disease, or end stage renal disease: Secondary | ICD-10-CM | POA: Diagnosis not present

## 2018-01-06 DIAGNOSIS — L03115 Cellulitis of right lower limb: Secondary | ICD-10-CM | POA: Insufficient documentation

## 2018-01-06 DIAGNOSIS — I255 Ischemic cardiomyopathy: Secondary | ICD-10-CM

## 2018-01-06 DIAGNOSIS — M10362 Gout due to renal impairment, left knee: Secondary | ICD-10-CM

## 2018-01-06 DIAGNOSIS — M1A0620 Idiopathic chronic gout, left knee, without tophus (tophi): Secondary | ICD-10-CM | POA: Diagnosis not present

## 2018-01-06 MED ORDER — ALLOPURINOL 100 MG PO TABS: 100.0000 mg | ORAL_TABLET | Freq: Every day | ORAL | 6 refills | Status: DC

## 2018-01-06 NOTE — Assessment & Plan Note (Signed)
No current gout-most recent episode successfully treated with steroid injection and oral taper steroids He is taking the Uloric 40 mg daily-dose cannot be increased due to kidney function He wonders about trying allopurinol-advised that we can try it, but not sure if it would help more Discussed that his recurrent gout is likely related to his kidney function Advised him to discuss with his nephrologist to see with a maximum amount of medication he recommends given that he is on hemodialysis

## 2018-01-06 NOTE — Patient Instructions (Addendum)
Stop the uloric and start allopurinol 100 mg daily.  This was sent to your pharmacy.     Discuss the gout treatment with your kidney doctor.

## 2018-01-06 NOTE — Assessment & Plan Note (Signed)
Currently being treated with doxycycline by his nephrologist They are monitoring this closely whenever he goes to dialysis

## 2018-01-07 DIAGNOSIS — N186 End stage renal disease: Secondary | ICD-10-CM | POA: Diagnosis not present

## 2018-01-07 DIAGNOSIS — D631 Anemia in chronic kidney disease: Secondary | ICD-10-CM | POA: Diagnosis not present

## 2018-01-07 DIAGNOSIS — Z23 Encounter for immunization: Secondary | ICD-10-CM | POA: Diagnosis not present

## 2018-01-07 DIAGNOSIS — M1000 Idiopathic gout, unspecified site: Secondary | ICD-10-CM | POA: Diagnosis not present

## 2018-01-07 DIAGNOSIS — E1129 Type 2 diabetes mellitus with other diabetic kidney complication: Secondary | ICD-10-CM | POA: Diagnosis not present

## 2018-01-07 DIAGNOSIS — N2581 Secondary hyperparathyroidism of renal origin: Secondary | ICD-10-CM | POA: Diagnosis not present

## 2018-01-07 DIAGNOSIS — D509 Iron deficiency anemia, unspecified: Secondary | ICD-10-CM | POA: Diagnosis not present

## 2018-01-08 ENCOUNTER — Ambulatory Visit (INDEPENDENT_AMBULATORY_CARE_PROVIDER_SITE_OTHER): Payer: Medicare Other

## 2018-01-08 DIAGNOSIS — J4540 Moderate persistent asthma, uncomplicated: Secondary | ICD-10-CM

## 2018-01-08 DIAGNOSIS — I132 Hypertensive heart and chronic kidney disease with heart failure and with stage 5 chronic kidney disease, or end stage renal disease: Secondary | ICD-10-CM | POA: Diagnosis not present

## 2018-01-08 DIAGNOSIS — M1A0620 Idiopathic chronic gout, left knee, without tophus (tophi): Secondary | ICD-10-CM | POA: Diagnosis not present

## 2018-01-09 DIAGNOSIS — N186 End stage renal disease: Secondary | ICD-10-CM | POA: Diagnosis not present

## 2018-01-09 DIAGNOSIS — Z23 Encounter for immunization: Secondary | ICD-10-CM | POA: Diagnosis not present

## 2018-01-09 DIAGNOSIS — N2581 Secondary hyperparathyroidism of renal origin: Secondary | ICD-10-CM | POA: Diagnosis not present

## 2018-01-09 DIAGNOSIS — D509 Iron deficiency anemia, unspecified: Secondary | ICD-10-CM | POA: Diagnosis not present

## 2018-01-09 DIAGNOSIS — D631 Anemia in chronic kidney disease: Secondary | ICD-10-CM | POA: Diagnosis not present

## 2018-01-09 DIAGNOSIS — E1129 Type 2 diabetes mellitus with other diabetic kidney complication: Secondary | ICD-10-CM | POA: Diagnosis not present

## 2018-01-09 MED ORDER — OMALIZUMAB 150 MG ~~LOC~~ SOLR
300.0000 mg | Freq: Once | SUBCUTANEOUS | Status: AC
  Administered 2018-01-08: 300 mg via SUBCUTANEOUS

## 2018-01-12 DIAGNOSIS — D631 Anemia in chronic kidney disease: Secondary | ICD-10-CM | POA: Diagnosis not present

## 2018-01-12 DIAGNOSIS — Z23 Encounter for immunization: Secondary | ICD-10-CM | POA: Diagnosis not present

## 2018-01-12 DIAGNOSIS — N186 End stage renal disease: Secondary | ICD-10-CM | POA: Diagnosis not present

## 2018-01-12 DIAGNOSIS — N2581 Secondary hyperparathyroidism of renal origin: Secondary | ICD-10-CM | POA: Diagnosis not present

## 2018-01-12 DIAGNOSIS — D509 Iron deficiency anemia, unspecified: Secondary | ICD-10-CM | POA: Diagnosis not present

## 2018-01-12 DIAGNOSIS — E1129 Type 2 diabetes mellitus with other diabetic kidney complication: Secondary | ICD-10-CM | POA: Diagnosis not present

## 2018-01-13 DIAGNOSIS — M1A0620 Idiopathic chronic gout, left knee, without tophus (tophi): Secondary | ICD-10-CM | POA: Diagnosis not present

## 2018-01-13 DIAGNOSIS — E291 Testicular hypofunction: Secondary | ICD-10-CM | POA: Diagnosis not present

## 2018-01-13 DIAGNOSIS — I132 Hypertensive heart and chronic kidney disease with heart failure and with stage 5 chronic kidney disease, or end stage renal disease: Secondary | ICD-10-CM | POA: Diagnosis not present

## 2018-01-14 ENCOUNTER — Telehealth: Payer: Self-pay | Admitting: Pulmonary Disease

## 2018-01-14 DIAGNOSIS — E1129 Type 2 diabetes mellitus with other diabetic kidney complication: Secondary | ICD-10-CM | POA: Diagnosis not present

## 2018-01-14 DIAGNOSIS — D509 Iron deficiency anemia, unspecified: Secondary | ICD-10-CM | POA: Diagnosis not present

## 2018-01-14 DIAGNOSIS — N186 End stage renal disease: Secondary | ICD-10-CM | POA: Diagnosis not present

## 2018-01-14 DIAGNOSIS — D631 Anemia in chronic kidney disease: Secondary | ICD-10-CM | POA: Diagnosis not present

## 2018-01-14 DIAGNOSIS — Z23 Encounter for immunization: Secondary | ICD-10-CM | POA: Diagnosis not present

## 2018-01-14 DIAGNOSIS — N2581 Secondary hyperparathyroidism of renal origin: Secondary | ICD-10-CM | POA: Diagnosis not present

## 2018-01-14 NOTE — Telephone Encounter (Signed)
Prefilled Syringe: #150mg  4  #75mg  0 Ordered Date: 01/14/18 Shipping Date: 01/14/18

## 2018-01-15 DIAGNOSIS — M1A0620 Idiopathic chronic gout, left knee, without tophus (tophi): Secondary | ICD-10-CM | POA: Diagnosis not present

## 2018-01-15 DIAGNOSIS — I132 Hypertensive heart and chronic kidney disease with heart failure and with stage 5 chronic kidney disease, or end stage renal disease: Secondary | ICD-10-CM | POA: Diagnosis not present

## 2018-01-16 DIAGNOSIS — D509 Iron deficiency anemia, unspecified: Secondary | ICD-10-CM | POA: Diagnosis not present

## 2018-01-16 DIAGNOSIS — E1129 Type 2 diabetes mellitus with other diabetic kidney complication: Secondary | ICD-10-CM | POA: Diagnosis not present

## 2018-01-16 DIAGNOSIS — N2581 Secondary hyperparathyroidism of renal origin: Secondary | ICD-10-CM | POA: Diagnosis not present

## 2018-01-16 DIAGNOSIS — N186 End stage renal disease: Secondary | ICD-10-CM | POA: Diagnosis not present

## 2018-01-16 DIAGNOSIS — Z23 Encounter for immunization: Secondary | ICD-10-CM | POA: Diagnosis not present

## 2018-01-16 DIAGNOSIS — D631 Anemia in chronic kidney disease: Secondary | ICD-10-CM | POA: Diagnosis not present

## 2018-01-16 NOTE — Telephone Encounter (Signed)
Prefilled Syringes: # 150mg  4  #75mg  0 Arrival Date:01/15/18 Lot #: 150mg  [id number]      75mg  0 Exp Date: 150mg  08/2018   75mg  0

## 2018-01-19 DIAGNOSIS — Z23 Encounter for immunization: Secondary | ICD-10-CM | POA: Diagnosis not present

## 2018-01-19 DIAGNOSIS — D509 Iron deficiency anemia, unspecified: Secondary | ICD-10-CM | POA: Diagnosis not present

## 2018-01-19 DIAGNOSIS — D631 Anemia in chronic kidney disease: Secondary | ICD-10-CM | POA: Diagnosis not present

## 2018-01-19 DIAGNOSIS — N2581 Secondary hyperparathyroidism of renal origin: Secondary | ICD-10-CM | POA: Diagnosis not present

## 2018-01-19 DIAGNOSIS — N186 End stage renal disease: Secondary | ICD-10-CM | POA: Diagnosis not present

## 2018-01-19 DIAGNOSIS — E1129 Type 2 diabetes mellitus with other diabetic kidney complication: Secondary | ICD-10-CM | POA: Diagnosis not present

## 2018-01-20 DIAGNOSIS — I132 Hypertensive heart and chronic kidney disease with heart failure and with stage 5 chronic kidney disease, or end stage renal disease: Secondary | ICD-10-CM | POA: Diagnosis not present

## 2018-01-20 DIAGNOSIS — M1A0620 Idiopathic chronic gout, left knee, without tophus (tophi): Secondary | ICD-10-CM | POA: Diagnosis not present

## 2018-01-21 DIAGNOSIS — D631 Anemia in chronic kidney disease: Secondary | ICD-10-CM | POA: Diagnosis not present

## 2018-01-21 DIAGNOSIS — N186 End stage renal disease: Secondary | ICD-10-CM | POA: Diagnosis not present

## 2018-01-21 DIAGNOSIS — N2581 Secondary hyperparathyroidism of renal origin: Secondary | ICD-10-CM | POA: Diagnosis not present

## 2018-01-21 DIAGNOSIS — D509 Iron deficiency anemia, unspecified: Secondary | ICD-10-CM | POA: Diagnosis not present

## 2018-01-21 DIAGNOSIS — Z23 Encounter for immunization: Secondary | ICD-10-CM | POA: Diagnosis not present

## 2018-01-21 DIAGNOSIS — E1129 Type 2 diabetes mellitus with other diabetic kidney complication: Secondary | ICD-10-CM | POA: Diagnosis not present

## 2018-01-22 ENCOUNTER — Ambulatory Visit: Payer: Medicare Other

## 2018-01-23 DIAGNOSIS — D631 Anemia in chronic kidney disease: Secondary | ICD-10-CM | POA: Diagnosis not present

## 2018-01-23 DIAGNOSIS — N2581 Secondary hyperparathyroidism of renal origin: Secondary | ICD-10-CM | POA: Diagnosis not present

## 2018-01-23 DIAGNOSIS — N186 End stage renal disease: Secondary | ICD-10-CM | POA: Diagnosis not present

## 2018-01-23 DIAGNOSIS — E1129 Type 2 diabetes mellitus with other diabetic kidney complication: Secondary | ICD-10-CM | POA: Diagnosis not present

## 2018-01-23 DIAGNOSIS — D509 Iron deficiency anemia, unspecified: Secondary | ICD-10-CM | POA: Diagnosis not present

## 2018-01-23 DIAGNOSIS — Z23 Encounter for immunization: Secondary | ICD-10-CM | POA: Diagnosis not present

## 2018-01-26 DIAGNOSIS — N186 End stage renal disease: Secondary | ICD-10-CM | POA: Diagnosis not present

## 2018-01-26 DIAGNOSIS — E1129 Type 2 diabetes mellitus with other diabetic kidney complication: Secondary | ICD-10-CM | POA: Diagnosis not present

## 2018-01-26 DIAGNOSIS — Z23 Encounter for immunization: Secondary | ICD-10-CM | POA: Diagnosis not present

## 2018-01-26 DIAGNOSIS — D509 Iron deficiency anemia, unspecified: Secondary | ICD-10-CM | POA: Diagnosis not present

## 2018-01-26 DIAGNOSIS — N2581 Secondary hyperparathyroidism of renal origin: Secondary | ICD-10-CM | POA: Diagnosis not present

## 2018-01-26 DIAGNOSIS — D631 Anemia in chronic kidney disease: Secondary | ICD-10-CM | POA: Diagnosis not present

## 2018-01-27 DIAGNOSIS — M1A0620 Idiopathic chronic gout, left knee, without tophus (tophi): Secondary | ICD-10-CM | POA: Diagnosis not present

## 2018-01-27 DIAGNOSIS — I132 Hypertensive heart and chronic kidney disease with heart failure and with stage 5 chronic kidney disease, or end stage renal disease: Secondary | ICD-10-CM | POA: Diagnosis not present

## 2018-01-28 DIAGNOSIS — N186 End stage renal disease: Secondary | ICD-10-CM | POA: Diagnosis not present

## 2018-01-28 DIAGNOSIS — Z23 Encounter for immunization: Secondary | ICD-10-CM | POA: Diagnosis not present

## 2018-01-28 DIAGNOSIS — D509 Iron deficiency anemia, unspecified: Secondary | ICD-10-CM | POA: Diagnosis not present

## 2018-01-28 DIAGNOSIS — E1129 Type 2 diabetes mellitus with other diabetic kidney complication: Secondary | ICD-10-CM | POA: Diagnosis not present

## 2018-01-28 DIAGNOSIS — N2581 Secondary hyperparathyroidism of renal origin: Secondary | ICD-10-CM | POA: Diagnosis not present

## 2018-01-28 DIAGNOSIS — D631 Anemia in chronic kidney disease: Secondary | ICD-10-CM | POA: Diagnosis not present

## 2018-01-29 ENCOUNTER — Ambulatory Visit: Payer: Medicare Other

## 2018-01-30 DIAGNOSIS — N2581 Secondary hyperparathyroidism of renal origin: Secondary | ICD-10-CM | POA: Diagnosis not present

## 2018-01-30 DIAGNOSIS — N186 End stage renal disease: Secondary | ICD-10-CM | POA: Diagnosis not present

## 2018-01-30 DIAGNOSIS — E1129 Type 2 diabetes mellitus with other diabetic kidney complication: Secondary | ICD-10-CM | POA: Diagnosis not present

## 2018-01-30 DIAGNOSIS — Z23 Encounter for immunization: Secondary | ICD-10-CM | POA: Diagnosis not present

## 2018-01-30 DIAGNOSIS — D509 Iron deficiency anemia, unspecified: Secondary | ICD-10-CM | POA: Diagnosis not present

## 2018-01-30 DIAGNOSIS — D631 Anemia in chronic kidney disease: Secondary | ICD-10-CM | POA: Diagnosis not present

## 2018-02-02 DIAGNOSIS — I129 Hypertensive chronic kidney disease with stage 1 through stage 4 chronic kidney disease, or unspecified chronic kidney disease: Secondary | ICD-10-CM | POA: Diagnosis not present

## 2018-02-02 DIAGNOSIS — E1129 Type 2 diabetes mellitus with other diabetic kidney complication: Secondary | ICD-10-CM | POA: Diagnosis not present

## 2018-02-02 DIAGNOSIS — D631 Anemia in chronic kidney disease: Secondary | ICD-10-CM | POA: Diagnosis not present

## 2018-02-02 DIAGNOSIS — Z992 Dependence on renal dialysis: Secondary | ICD-10-CM | POA: Diagnosis not present

## 2018-02-02 DIAGNOSIS — Z23 Encounter for immunization: Secondary | ICD-10-CM | POA: Diagnosis not present

## 2018-02-02 DIAGNOSIS — N186 End stage renal disease: Secondary | ICD-10-CM | POA: Diagnosis not present

## 2018-02-02 DIAGNOSIS — N2581 Secondary hyperparathyroidism of renal origin: Secondary | ICD-10-CM | POA: Diagnosis not present

## 2018-02-03 DIAGNOSIS — E291 Testicular hypofunction: Secondary | ICD-10-CM | POA: Diagnosis not present

## 2018-02-04 DIAGNOSIS — D631 Anemia in chronic kidney disease: Secondary | ICD-10-CM | POA: Diagnosis not present

## 2018-02-04 DIAGNOSIS — Z23 Encounter for immunization: Secondary | ICD-10-CM | POA: Diagnosis not present

## 2018-02-04 DIAGNOSIS — N2581 Secondary hyperparathyroidism of renal origin: Secondary | ICD-10-CM | POA: Diagnosis not present

## 2018-02-04 DIAGNOSIS — N186 End stage renal disease: Secondary | ICD-10-CM | POA: Diagnosis not present

## 2018-02-04 DIAGNOSIS — E1129 Type 2 diabetes mellitus with other diabetic kidney complication: Secondary | ICD-10-CM | POA: Diagnosis not present

## 2018-02-06 DIAGNOSIS — Z23 Encounter for immunization: Secondary | ICD-10-CM | POA: Diagnosis not present

## 2018-02-06 DIAGNOSIS — N2581 Secondary hyperparathyroidism of renal origin: Secondary | ICD-10-CM | POA: Diagnosis not present

## 2018-02-06 DIAGNOSIS — N186 End stage renal disease: Secondary | ICD-10-CM | POA: Diagnosis not present

## 2018-02-06 DIAGNOSIS — D631 Anemia in chronic kidney disease: Secondary | ICD-10-CM | POA: Diagnosis not present

## 2018-02-06 DIAGNOSIS — E1129 Type 2 diabetes mellitus with other diabetic kidney complication: Secondary | ICD-10-CM | POA: Diagnosis not present

## 2018-02-09 DIAGNOSIS — D631 Anemia in chronic kidney disease: Secondary | ICD-10-CM | POA: Diagnosis not present

## 2018-02-09 DIAGNOSIS — N2581 Secondary hyperparathyroidism of renal origin: Secondary | ICD-10-CM | POA: Diagnosis not present

## 2018-02-09 DIAGNOSIS — N186 End stage renal disease: Secondary | ICD-10-CM | POA: Diagnosis not present

## 2018-02-09 DIAGNOSIS — Z23 Encounter for immunization: Secondary | ICD-10-CM | POA: Diagnosis not present

## 2018-02-09 DIAGNOSIS — E1129 Type 2 diabetes mellitus with other diabetic kidney complication: Secondary | ICD-10-CM | POA: Diagnosis not present

## 2018-02-10 ENCOUNTER — Ambulatory Visit (INDEPENDENT_AMBULATORY_CARE_PROVIDER_SITE_OTHER): Payer: Medicare Other

## 2018-02-10 DIAGNOSIS — J4540 Moderate persistent asthma, uncomplicated: Secondary | ICD-10-CM | POA: Diagnosis not present

## 2018-02-10 DIAGNOSIS — M1A0620 Idiopathic chronic gout, left knee, without tophus (tophi): Secondary | ICD-10-CM | POA: Diagnosis not present

## 2018-02-10 DIAGNOSIS — I132 Hypertensive heart and chronic kidney disease with heart failure and with stage 5 chronic kidney disease, or end stage renal disease: Secondary | ICD-10-CM | POA: Diagnosis not present

## 2018-02-10 MED ORDER — OMALIZUMAB 150 MG ~~LOC~~ SOLR
300.0000 mg | Freq: Once | SUBCUTANEOUS | Status: AC
  Administered 2018-02-10: 300 mg via SUBCUTANEOUS

## 2018-02-11 DIAGNOSIS — N186 End stage renal disease: Secondary | ICD-10-CM | POA: Diagnosis not present

## 2018-02-11 DIAGNOSIS — N2581 Secondary hyperparathyroidism of renal origin: Secondary | ICD-10-CM | POA: Diagnosis not present

## 2018-02-11 DIAGNOSIS — D631 Anemia in chronic kidney disease: Secondary | ICD-10-CM | POA: Diagnosis not present

## 2018-02-11 DIAGNOSIS — E1129 Type 2 diabetes mellitus with other diabetic kidney complication: Secondary | ICD-10-CM | POA: Diagnosis not present

## 2018-02-11 DIAGNOSIS — Z23 Encounter for immunization: Secondary | ICD-10-CM | POA: Diagnosis not present

## 2018-02-12 ENCOUNTER — Ambulatory Visit (INDEPENDENT_AMBULATORY_CARE_PROVIDER_SITE_OTHER): Payer: Medicare Other | Admitting: Podiatry

## 2018-02-12 ENCOUNTER — Other Ambulatory Visit: Payer: Self-pay | Admitting: Internal Medicine

## 2018-02-12 DIAGNOSIS — E1151 Type 2 diabetes mellitus with diabetic peripheral angiopathy without gangrene: Secondary | ICD-10-CM

## 2018-02-12 DIAGNOSIS — M1A0620 Idiopathic chronic gout, left knee, without tophus (tophi): Secondary | ICD-10-CM | POA: Diagnosis not present

## 2018-02-12 DIAGNOSIS — I132 Hypertensive heart and chronic kidney disease with heart failure and with stage 5 chronic kidney disease, or end stage renal disease: Secondary | ICD-10-CM | POA: Diagnosis not present

## 2018-02-12 DIAGNOSIS — B351 Tinea unguium: Secondary | ICD-10-CM

## 2018-02-12 DIAGNOSIS — F5101 Primary insomnia: Secondary | ICD-10-CM

## 2018-02-12 NOTE — Telephone Encounter (Signed)
[place] Controlled Substance Database checked. Last filled on 01/13/18

## 2018-02-13 DIAGNOSIS — E1129 Type 2 diabetes mellitus with other diabetic kidney complication: Secondary | ICD-10-CM | POA: Diagnosis not present

## 2018-02-13 DIAGNOSIS — D631 Anemia in chronic kidney disease: Secondary | ICD-10-CM | POA: Diagnosis not present

## 2018-02-13 DIAGNOSIS — N186 End stage renal disease: Secondary | ICD-10-CM | POA: Diagnosis not present

## 2018-02-13 DIAGNOSIS — Z23 Encounter for immunization: Secondary | ICD-10-CM | POA: Diagnosis not present

## 2018-02-13 DIAGNOSIS — N2581 Secondary hyperparathyroidism of renal origin: Secondary | ICD-10-CM | POA: Diagnosis not present

## 2018-02-16 DIAGNOSIS — Z23 Encounter for immunization: Secondary | ICD-10-CM | POA: Diagnosis not present

## 2018-02-16 DIAGNOSIS — N186 End stage renal disease: Secondary | ICD-10-CM | POA: Diagnosis not present

## 2018-02-16 DIAGNOSIS — N2581 Secondary hyperparathyroidism of renal origin: Secondary | ICD-10-CM | POA: Diagnosis not present

## 2018-02-16 DIAGNOSIS — D631 Anemia in chronic kidney disease: Secondary | ICD-10-CM | POA: Diagnosis not present

## 2018-02-16 DIAGNOSIS — E1129 Type 2 diabetes mellitus with other diabetic kidney complication: Secondary | ICD-10-CM | POA: Diagnosis not present

## 2018-02-17 DIAGNOSIS — I132 Hypertensive heart and chronic kidney disease with heart failure and with stage 5 chronic kidney disease, or end stage renal disease: Secondary | ICD-10-CM | POA: Diagnosis not present

## 2018-02-17 DIAGNOSIS — M1A0620 Idiopathic chronic gout, left knee, without tophus (tophi): Secondary | ICD-10-CM | POA: Diagnosis not present

## 2018-02-18 DIAGNOSIS — N186 End stage renal disease: Secondary | ICD-10-CM | POA: Diagnosis not present

## 2018-02-18 DIAGNOSIS — Z23 Encounter for immunization: Secondary | ICD-10-CM | POA: Diagnosis not present

## 2018-02-18 DIAGNOSIS — D631 Anemia in chronic kidney disease: Secondary | ICD-10-CM | POA: Diagnosis not present

## 2018-02-18 DIAGNOSIS — E1129 Type 2 diabetes mellitus with other diabetic kidney complication: Secondary | ICD-10-CM | POA: Diagnosis not present

## 2018-02-18 DIAGNOSIS — N2581 Secondary hyperparathyroidism of renal origin: Secondary | ICD-10-CM | POA: Diagnosis not present

## 2018-02-20 DIAGNOSIS — D631 Anemia in chronic kidney disease: Secondary | ICD-10-CM | POA: Diagnosis not present

## 2018-02-20 DIAGNOSIS — E1129 Type 2 diabetes mellitus with other diabetic kidney complication: Secondary | ICD-10-CM | POA: Diagnosis not present

## 2018-02-20 DIAGNOSIS — Z23 Encounter for immunization: Secondary | ICD-10-CM | POA: Diagnosis not present

## 2018-02-20 DIAGNOSIS — N2581 Secondary hyperparathyroidism of renal origin: Secondary | ICD-10-CM | POA: Diagnosis not present

## 2018-02-20 DIAGNOSIS — N186 End stage renal disease: Secondary | ICD-10-CM | POA: Diagnosis not present

## 2018-02-23 DIAGNOSIS — N186 End stage renal disease: Secondary | ICD-10-CM | POA: Diagnosis not present

## 2018-02-23 DIAGNOSIS — N2581 Secondary hyperparathyroidism of renal origin: Secondary | ICD-10-CM | POA: Diagnosis not present

## 2018-02-23 NOTE — Progress Notes (Signed)
  Subjective:  Patient ID: Shane [name], male    DOB: [birth year],  MRN: [id number]  Chief Complaint  Patient presents with  . Nail Problem    61 day debride   84 y.o. male returns for diabetic foot care.  No new complaints  Objective:  PAD General AA&O x3. Normal mood and affect.  Vascular Dorsalis pedis pulses present 1+ bilaterally  Posterior tibial pulses absent bilaterally  Capillary refill normal to all digits. Pedal hair growth normal.  Neurologic Epicritic sensation present bilaterally. Protective sensation with 5.07 monofilament  present bilaterally. Vibratory sensation present bilaterally.  Dermatologic No open lesions. Interspaces clear of maceration.  Normal skin temperature and turgor. Hyperkeratotic lesions: None bilaterally. Nails: brittle, onychomycosis, thickening, elongation  Orthopedic: No history of amputation. MMT 5/5 in dorsiflexion, plantarflexion, inversion, and eversion. Normal lower extremity joint ROM without pain or crepitus.   Assessment & Plan:  Patient was evaluated and treated and all questions answered.  Diabetes with PAD, Onychomycosis -Educated on diabetic footcare. Diabetic risk level 1 -At risk foot care provided as below.  Procedure: Nail Debridement Rationale: Patient meets criteria for routine foot care due to class B findings Type of Debridement: manual, sharp debridement. Instrumentation: Nail nipper, rotary burr. Number of Nails: 10  Return in about 2 months (around 04/16/2018) for Diabetic Foot Care.

## 2018-02-25 DIAGNOSIS — N186 End stage renal disease: Secondary | ICD-10-CM | POA: Diagnosis not present

## 2018-02-25 DIAGNOSIS — N2581 Secondary hyperparathyroidism of renal origin: Secondary | ICD-10-CM | POA: Diagnosis not present

## 2018-02-26 ENCOUNTER — Ambulatory Visit (INDEPENDENT_AMBULATORY_CARE_PROVIDER_SITE_OTHER): Payer: Medicare Other | Admitting: *Deleted

## 2018-02-26 DIAGNOSIS — I255 Ischemic cardiomyopathy: Secondary | ICD-10-CM | POA: Diagnosis not present

## 2018-02-26 DIAGNOSIS — I5022 Chronic systolic (congestive) heart failure: Secondary | ICD-10-CM

## 2018-02-26 NOTE — Progress Notes (Signed)
Remote ICD transmission.   

## 2018-02-27 DIAGNOSIS — N2581 Secondary hyperparathyroidism of renal origin: Secondary | ICD-10-CM | POA: Diagnosis not present

## 2018-02-27 DIAGNOSIS — N186 End stage renal disease: Secondary | ICD-10-CM | POA: Diagnosis not present

## 2018-03-02 DIAGNOSIS — N186 End stage renal disease: Secondary | ICD-10-CM | POA: Diagnosis not present

## 2018-03-02 DIAGNOSIS — Z23 Encounter for immunization: Secondary | ICD-10-CM | POA: Diagnosis not present

## 2018-03-02 DIAGNOSIS — N2581 Secondary hyperparathyroidism of renal origin: Secondary | ICD-10-CM | POA: Diagnosis not present

## 2018-03-02 DIAGNOSIS — E1129 Type 2 diabetes mellitus with other diabetic kidney complication: Secondary | ICD-10-CM | POA: Diagnosis not present

## 2018-03-02 DIAGNOSIS — D631 Anemia in chronic kidney disease: Secondary | ICD-10-CM | POA: Diagnosis not present

## 2018-03-03 ENCOUNTER — Ambulatory Visit: Payer: Medicare Other

## 2018-03-03 DIAGNOSIS — E291 Testicular hypofunction: Secondary | ICD-10-CM | POA: Diagnosis not present

## 2018-03-04 DIAGNOSIS — N2581 Secondary hyperparathyroidism of renal origin: Secondary | ICD-10-CM | POA: Diagnosis not present

## 2018-03-04 DIAGNOSIS — E1129 Type 2 diabetes mellitus with other diabetic kidney complication: Secondary | ICD-10-CM | POA: Diagnosis not present

## 2018-03-04 DIAGNOSIS — Z23 Encounter for immunization: Secondary | ICD-10-CM | POA: Diagnosis not present

## 2018-03-04 DIAGNOSIS — N186 End stage renal disease: Secondary | ICD-10-CM | POA: Diagnosis not present

## 2018-03-04 DIAGNOSIS — D631 Anemia in chronic kidney disease: Secondary | ICD-10-CM | POA: Diagnosis not present

## 2018-03-05 ENCOUNTER — Ambulatory Visit (INDEPENDENT_AMBULATORY_CARE_PROVIDER_SITE_OTHER): Payer: Medicare Other

## 2018-03-05 DIAGNOSIS — Z992 Dependence on renal dialysis: Secondary | ICD-10-CM | POA: Diagnosis not present

## 2018-03-05 DIAGNOSIS — J4540 Moderate persistent asthma, uncomplicated: Secondary | ICD-10-CM

## 2018-03-05 DIAGNOSIS — N186 End stage renal disease: Secondary | ICD-10-CM | POA: Diagnosis not present

## 2018-03-05 DIAGNOSIS — I129 Hypertensive chronic kidney disease with stage 1 through stage 4 chronic kidney disease, or unspecified chronic kidney disease: Secondary | ICD-10-CM | POA: Diagnosis not present

## 2018-03-06 DIAGNOSIS — N2581 Secondary hyperparathyroidism of renal origin: Secondary | ICD-10-CM | POA: Diagnosis not present

## 2018-03-06 DIAGNOSIS — N186 End stage renal disease: Secondary | ICD-10-CM | POA: Diagnosis not present

## 2018-03-06 DIAGNOSIS — D631 Anemia in chronic kidney disease: Secondary | ICD-10-CM | POA: Diagnosis not present

## 2018-03-06 DIAGNOSIS — D509 Iron deficiency anemia, unspecified: Secondary | ICD-10-CM | POA: Diagnosis not present

## 2018-03-06 DIAGNOSIS — E1129 Type 2 diabetes mellitus with other diabetic kidney complication: Secondary | ICD-10-CM | POA: Diagnosis not present

## 2018-03-09 DIAGNOSIS — N2581 Secondary hyperparathyroidism of renal origin: Secondary | ICD-10-CM | POA: Diagnosis not present

## 2018-03-09 DIAGNOSIS — D631 Anemia in chronic kidney disease: Secondary | ICD-10-CM | POA: Diagnosis not present

## 2018-03-09 DIAGNOSIS — E1129 Type 2 diabetes mellitus with other diabetic kidney complication: Secondary | ICD-10-CM | POA: Diagnosis not present

## 2018-03-09 DIAGNOSIS — D509 Iron deficiency anemia, unspecified: Secondary | ICD-10-CM | POA: Diagnosis not present

## 2018-03-09 DIAGNOSIS — N186 End stage renal disease: Secondary | ICD-10-CM | POA: Diagnosis not present

## 2018-03-11 DIAGNOSIS — N2581 Secondary hyperparathyroidism of renal origin: Secondary | ICD-10-CM | POA: Diagnosis not present

## 2018-03-11 DIAGNOSIS — D631 Anemia in chronic kidney disease: Secondary | ICD-10-CM | POA: Diagnosis not present

## 2018-03-11 DIAGNOSIS — E1129 Type 2 diabetes mellitus with other diabetic kidney complication: Secondary | ICD-10-CM | POA: Diagnosis not present

## 2018-03-11 DIAGNOSIS — D509 Iron deficiency anemia, unspecified: Secondary | ICD-10-CM | POA: Diagnosis not present

## 2018-03-11 DIAGNOSIS — N186 End stage renal disease: Secondary | ICD-10-CM | POA: Diagnosis not present

## 2018-03-13 DIAGNOSIS — N186 End stage renal disease: Secondary | ICD-10-CM | POA: Diagnosis not present

## 2018-03-13 DIAGNOSIS — E1129 Type 2 diabetes mellitus with other diabetic kidney complication: Secondary | ICD-10-CM | POA: Diagnosis not present

## 2018-03-13 DIAGNOSIS — D509 Iron deficiency anemia, unspecified: Secondary | ICD-10-CM | POA: Diagnosis not present

## 2018-03-13 DIAGNOSIS — N2581 Secondary hyperparathyroidism of renal origin: Secondary | ICD-10-CM | POA: Diagnosis not present

## 2018-03-13 DIAGNOSIS — D631 Anemia in chronic kidney disease: Secondary | ICD-10-CM | POA: Diagnosis not present

## 2018-03-13 MED ORDER — OMALIZUMAB 150 MG ~~LOC~~ SOLR
300.0000 mg | SUBCUTANEOUS | Status: DC
  Administered 2018-03-05: 300 mg via SUBCUTANEOUS

## 2018-03-13 NOTE — Progress Notes (Signed)
Documentation of medication administration and charges of Xolair have been completed by Shantera Monts, CMA based on the Xolair documentation sheet completed by Tammy Scott.  

## 2018-03-16 DIAGNOSIS — D509 Iron deficiency anemia, unspecified: Secondary | ICD-10-CM | POA: Diagnosis not present

## 2018-03-16 DIAGNOSIS — E1129 Type 2 diabetes mellitus with other diabetic kidney complication: Secondary | ICD-10-CM | POA: Diagnosis not present

## 2018-03-16 DIAGNOSIS — N186 End stage renal disease: Secondary | ICD-10-CM | POA: Diagnosis not present

## 2018-03-16 DIAGNOSIS — D631 Anemia in chronic kidney disease: Secondary | ICD-10-CM | POA: Diagnosis not present

## 2018-03-16 DIAGNOSIS — N2581 Secondary hyperparathyroidism of renal origin: Secondary | ICD-10-CM | POA: Diagnosis not present

## 2018-03-18 ENCOUNTER — Telehealth: Payer: Self-pay | Admitting: Pulmonary Disease

## 2018-03-18 DIAGNOSIS — N2581 Secondary hyperparathyroidism of renal origin: Secondary | ICD-10-CM | POA: Diagnosis not present

## 2018-03-18 DIAGNOSIS — N186 End stage renal disease: Secondary | ICD-10-CM | POA: Diagnosis not present

## 2018-03-18 DIAGNOSIS — D631 Anemia in chronic kidney disease: Secondary | ICD-10-CM | POA: Diagnosis not present

## 2018-03-18 DIAGNOSIS — E1129 Type 2 diabetes mellitus with other diabetic kidney complication: Secondary | ICD-10-CM | POA: Diagnosis not present

## 2018-03-18 DIAGNOSIS — D509 Iron deficiency anemia, unspecified: Secondary | ICD-10-CM | POA: Diagnosis not present

## 2018-03-18 NOTE — Telephone Encounter (Signed)
Prefilled Syringe: #150mg  4  #75mg  0 Ordered Date: 03/18/18 Shipping Date: 03/18/18

## 2018-03-19 NOTE — Telephone Encounter (Signed)
Prefilled Syringes: # 150mg  4  #75mg  0 Arrival Date: 03/19/18 Lot #: 150mg  [id number]      75mg  0 Exp Date: 150mg  09/2018   75mg  0

## 2018-03-20 DIAGNOSIS — E1129 Type 2 diabetes mellitus with other diabetic kidney complication: Secondary | ICD-10-CM | POA: Diagnosis not present

## 2018-03-20 DIAGNOSIS — N186 End stage renal disease: Secondary | ICD-10-CM | POA: Diagnosis not present

## 2018-03-20 DIAGNOSIS — D631 Anemia in chronic kidney disease: Secondary | ICD-10-CM | POA: Diagnosis not present

## 2018-03-20 DIAGNOSIS — N2581 Secondary hyperparathyroidism of renal origin: Secondary | ICD-10-CM | POA: Diagnosis not present

## 2018-03-20 DIAGNOSIS — D509 Iron deficiency anemia, unspecified: Secondary | ICD-10-CM | POA: Diagnosis not present

## 2018-03-23 DIAGNOSIS — E1129 Type 2 diabetes mellitus with other diabetic kidney complication: Secondary | ICD-10-CM | POA: Diagnosis not present

## 2018-03-23 DIAGNOSIS — D631 Anemia in chronic kidney disease: Secondary | ICD-10-CM | POA: Diagnosis not present

## 2018-03-23 DIAGNOSIS — N2581 Secondary hyperparathyroidism of renal origin: Secondary | ICD-10-CM | POA: Diagnosis not present

## 2018-03-23 DIAGNOSIS — N186 End stage renal disease: Secondary | ICD-10-CM | POA: Diagnosis not present

## 2018-03-23 DIAGNOSIS — D509 Iron deficiency anemia, unspecified: Secondary | ICD-10-CM | POA: Diagnosis not present

## 2018-03-24 ENCOUNTER — Ambulatory Visit: Payer: Medicare Other

## 2018-03-25 DIAGNOSIS — E1129 Type 2 diabetes mellitus with other diabetic kidney complication: Secondary | ICD-10-CM | POA: Diagnosis not present

## 2018-03-25 DIAGNOSIS — D509 Iron deficiency anemia, unspecified: Secondary | ICD-10-CM | POA: Diagnosis not present

## 2018-03-25 DIAGNOSIS — D631 Anemia in chronic kidney disease: Secondary | ICD-10-CM | POA: Diagnosis not present

## 2018-03-25 DIAGNOSIS — N186 End stage renal disease: Secondary | ICD-10-CM | POA: Diagnosis not present

## 2018-03-25 DIAGNOSIS — N2581 Secondary hyperparathyroidism of renal origin: Secondary | ICD-10-CM | POA: Diagnosis not present

## 2018-03-26 ENCOUNTER — Ambulatory Visit: Payer: Medicare Other

## 2018-03-27 DIAGNOSIS — D631 Anemia in chronic kidney disease: Secondary | ICD-10-CM | POA: Diagnosis not present

## 2018-03-27 DIAGNOSIS — N2581 Secondary hyperparathyroidism of renal origin: Secondary | ICD-10-CM | POA: Diagnosis not present

## 2018-03-27 DIAGNOSIS — D509 Iron deficiency anemia, unspecified: Secondary | ICD-10-CM | POA: Diagnosis not present

## 2018-03-27 DIAGNOSIS — E1129 Type 2 diabetes mellitus with other diabetic kidney complication: Secondary | ICD-10-CM | POA: Diagnosis not present

## 2018-03-27 DIAGNOSIS — N186 End stage renal disease: Secondary | ICD-10-CM | POA: Diagnosis not present

## 2018-03-30 DIAGNOSIS — D631 Anemia in chronic kidney disease: Secondary | ICD-10-CM | POA: Diagnosis not present

## 2018-03-30 DIAGNOSIS — N186 End stage renal disease: Secondary | ICD-10-CM | POA: Diagnosis not present

## 2018-03-30 DIAGNOSIS — N2581 Secondary hyperparathyroidism of renal origin: Secondary | ICD-10-CM | POA: Diagnosis not present

## 2018-03-30 DIAGNOSIS — E1129 Type 2 diabetes mellitus with other diabetic kidney complication: Secondary | ICD-10-CM | POA: Diagnosis not present

## 2018-03-30 DIAGNOSIS — D509 Iron deficiency anemia, unspecified: Secondary | ICD-10-CM | POA: Diagnosis not present

## 2018-03-31 ENCOUNTER — Ambulatory Visit: Payer: Medicare Other

## 2018-03-31 ENCOUNTER — Encounter: Payer: Self-pay | Admitting: Nephrology

## 2018-03-31 DIAGNOSIS — Z992 Dependence on renal dialysis: Secondary | ICD-10-CM | POA: Diagnosis not present

## 2018-03-31 DIAGNOSIS — I871 Compression of vein: Secondary | ICD-10-CM | POA: Diagnosis not present

## 2018-03-31 DIAGNOSIS — T82858A Stenosis of vascular prosthetic devices, implants and grafts, initial encounter: Secondary | ICD-10-CM | POA: Diagnosis not present

## 2018-03-31 DIAGNOSIS — N186 End stage renal disease: Secondary | ICD-10-CM | POA: Diagnosis not present

## 2018-04-01 DIAGNOSIS — N2581 Secondary hyperparathyroidism of renal origin: Secondary | ICD-10-CM | POA: Diagnosis not present

## 2018-04-01 DIAGNOSIS — D631 Anemia in chronic kidney disease: Secondary | ICD-10-CM | POA: Diagnosis not present

## 2018-04-01 DIAGNOSIS — E1129 Type 2 diabetes mellitus with other diabetic kidney complication: Secondary | ICD-10-CM | POA: Diagnosis not present

## 2018-04-01 DIAGNOSIS — N186 End stage renal disease: Secondary | ICD-10-CM | POA: Diagnosis not present

## 2018-04-01 DIAGNOSIS — D509 Iron deficiency anemia, unspecified: Secondary | ICD-10-CM | POA: Diagnosis not present

## 2018-04-03 DIAGNOSIS — D631 Anemia in chronic kidney disease: Secondary | ICD-10-CM | POA: Diagnosis not present

## 2018-04-03 DIAGNOSIS — E1129 Type 2 diabetes mellitus with other diabetic kidney complication: Secondary | ICD-10-CM | POA: Diagnosis not present

## 2018-04-03 DIAGNOSIS — D509 Iron deficiency anemia, unspecified: Secondary | ICD-10-CM | POA: Diagnosis not present

## 2018-04-03 DIAGNOSIS — N186 End stage renal disease: Secondary | ICD-10-CM | POA: Diagnosis not present

## 2018-04-03 DIAGNOSIS — N2581 Secondary hyperparathyroidism of renal origin: Secondary | ICD-10-CM | POA: Diagnosis not present

## 2018-04-05 DIAGNOSIS — Z992 Dependence on renal dialysis: Secondary | ICD-10-CM | POA: Diagnosis not present

## 2018-04-05 DIAGNOSIS — N186 End stage renal disease: Secondary | ICD-10-CM | POA: Diagnosis not present

## 2018-04-05 DIAGNOSIS — I129 Hypertensive chronic kidney disease with stage 1 through stage 4 chronic kidney disease, or unspecified chronic kidney disease: Secondary | ICD-10-CM | POA: Diagnosis not present

## 2018-04-06 DIAGNOSIS — N2581 Secondary hyperparathyroidism of renal origin: Secondary | ICD-10-CM | POA: Diagnosis not present

## 2018-04-06 DIAGNOSIS — D631 Anemia in chronic kidney disease: Secondary | ICD-10-CM | POA: Diagnosis not present

## 2018-04-06 DIAGNOSIS — E1129 Type 2 diabetes mellitus with other diabetic kidney complication: Secondary | ICD-10-CM | POA: Diagnosis not present

## 2018-04-06 DIAGNOSIS — N186 End stage renal disease: Secondary | ICD-10-CM | POA: Diagnosis not present

## 2018-04-06 DIAGNOSIS — D509 Iron deficiency anemia, unspecified: Secondary | ICD-10-CM | POA: Diagnosis not present

## 2018-04-06 LAB — CUP PACEART REMOTE DEVICE CHECK
Battery Remaining Longevity: 36 mo
Brady Statistic AP VP Percent: 0.06 %
Brady Statistic AP VS Percent: 0.01 %
Brady Statistic AS VP Percent: 97.5 %
Brady Statistic AS VS Percent: 2.43 %
Brady Statistic RA Percent Paced: 0.07 %
Brady Statistic RV Percent Paced: 2.54 %
HighPow Impedance: 65 Ohm
Implantable Lead Implant Date: 20160308
Implantable Lead Implant Date: 20160308
Implantable Lead Location: 753858
Implantable Lead Location: 753860
Implantable Lead Model: 5076
Lead Channel Impedance Value: 285 Ohm
Lead Channel Impedance Value: 342 Ohm
Lead Channel Impedance Value: 380 Ohm
Lead Channel Impedance Value: 399 Ohm
Lead Channel Impedance Value: 437 Ohm
Lead Channel Impedance Value: 437 Ohm
Lead Channel Impedance Value: 437 Ohm
Lead Channel Impedance Value: 513 Ohm
Lead Channel Impedance Value: 703 Ohm
Lead Channel Pacing Threshold Amplitude: 1.125 V
Lead Channel Pacing Threshold Pulse Width: 0.4 ms
Lead Channel Pacing Threshold Pulse Width: 0.6 ms
Lead Channel Sensing Intrinsic Amplitude: 12.25 mV
Lead Channel Sensing Intrinsic Amplitude: 12.25 mV
Lead Channel Sensing Intrinsic Amplitude: 2.25 mV
Lead Channel Sensing Intrinsic Amplitude: 2.25 mV
Lead Channel Setting Pacing Amplitude: 2 V
Lead Channel Setting Pacing Amplitude: 2.25 V
Lead Channel Setting Pacing Pulse Width: 0.8 ms
MDC IDC LEAD IMPLANT DT: 20160308
MDC IDC LEAD LOCATION: 753859
MDC IDC MSMT BATTERY VOLTAGE: 2.93 V
MDC IDC MSMT LEADCHNL LV IMPEDANCE VALUE: 551 Ohm
MDC IDC MSMT LEADCHNL LV IMPEDANCE VALUE: 646 Ohm
MDC IDC MSMT LEADCHNL LV IMPEDANCE VALUE: 703 Ohm
MDC IDC MSMT LEADCHNL RA PACING THRESHOLD AMPLITUDE: 0.75 V
MDC IDC MSMT LEADCHNL RA PACING THRESHOLD PULSEWIDTH: 0.4 ms
MDC IDC MSMT LEADCHNL RV IMPEDANCE VALUE: 342 Ohm
MDC IDC MSMT LEADCHNL RV PACING THRESHOLD AMPLITUDE: 2 V
MDC IDC PG IMPLANT DT: 20160308
MDC IDC SESS DTM: 20190724133834
MDC IDC SET LEADCHNL LV PACING PULSEWIDTH: 0.6 ms
MDC IDC SET LEADCHNL RV PACING AMPLITUDE: 4 V
MDC IDC SET LEADCHNL RV SENSING SENSITIVITY: 0.3 mV

## 2018-04-07 ENCOUNTER — Ambulatory Visit (INDEPENDENT_AMBULATORY_CARE_PROVIDER_SITE_OTHER): Payer: Medicare Other

## 2018-04-07 DIAGNOSIS — E291 Testicular hypofunction: Secondary | ICD-10-CM | POA: Diagnosis not present

## 2018-04-07 DIAGNOSIS — J4540 Moderate persistent asthma, uncomplicated: Secondary | ICD-10-CM

## 2018-04-08 ENCOUNTER — Encounter: Payer: Self-pay | Admitting: Pulmonary Disease

## 2018-04-08 ENCOUNTER — Ambulatory Visit (INDEPENDENT_AMBULATORY_CARE_PROVIDER_SITE_OTHER): Payer: Medicare Other | Admitting: Pulmonary Disease

## 2018-04-08 VITALS — BP 124/60 | HR 75 | Ht 70.28 in | Wt 200.6 lb

## 2018-04-08 DIAGNOSIS — J4540 Moderate persistent asthma, uncomplicated: Secondary | ICD-10-CM

## 2018-04-08 DIAGNOSIS — D509 Iron deficiency anemia, unspecified: Secondary | ICD-10-CM | POA: Diagnosis not present

## 2018-04-08 DIAGNOSIS — Z23 Encounter for immunization: Secondary | ICD-10-CM

## 2018-04-08 DIAGNOSIS — D631 Anemia in chronic kidney disease: Secondary | ICD-10-CM | POA: Diagnosis not present

## 2018-04-08 DIAGNOSIS — N186 End stage renal disease: Secondary | ICD-10-CM | POA: Diagnosis not present

## 2018-04-08 DIAGNOSIS — I255 Ischemic cardiomyopathy: Secondary | ICD-10-CM | POA: Diagnosis not present

## 2018-04-08 DIAGNOSIS — E1129 Type 2 diabetes mellitus with other diabetic kidney complication: Secondary | ICD-10-CM | POA: Diagnosis not present

## 2018-04-08 DIAGNOSIS — J4541 Moderate persistent asthma with (acute) exacerbation: Secondary | ICD-10-CM | POA: Diagnosis not present

## 2018-04-08 DIAGNOSIS — N2581 Secondary hyperparathyroidism of renal origin: Secondary | ICD-10-CM | POA: Diagnosis not present

## 2018-04-08 MED ORDER — ALBUTEROL SULFATE 108 (90 BASE) MCG/ACT IN AEPB: 1.0000 | INHALATION_SPRAY | Freq: Four times a day (QID) | RESPIRATORY_TRACT | 5 refills | Status: AC | PRN

## 2018-04-08 NOTE — Progress Notes (Signed)
Synopsis: Former patient of Dr. Ashok Cordia with Asthma on Xolair, allergic rhinitis and systolic heart failure.   Subjective:   PATIENT ID: Shane [name] GENDER: male DOB: [birth year], MRN: [id number]   HPI  Chief Complaint  Patient presents with  . Follow-up    former [facility] patient, reports breathing is better since starting hemodialysis     [name] comes to establish care with me today.  He was previously followed by my partner for moderate persistent asthma and had been treated with Xolair over the years.  He also has a history of cardiomyopathy and renal failure.  He has recently been started on dialysis.  He says that his breathing has been doing quite well over the last several years.  He says that he has not had problems with bronchitis, pneumonia.  He has not used albuterol in quite some time.  Past Medical History:  Diagnosis Date  . Abnormal nuclear cardiac imaging test Nov 2010   moderate area of infarct in the inferior wall with only minimal reversibility and EF of 28%  . AICD (automatic cardioverter/defibrillator) present   . Allergic rhinitis 01-08-13   Uses nebulizer for chronic sinus issues and Mucinex.  . Arthritis    osteoarthritis   . Asthma    Extrinic  . Blood transfusion    ? at time of bypass surgery   . BPH (benign prostatic hyperplasia)   . Cellulitis of arm, left    MSSA  . CHF (congestive heart failure) ([place])   . CKD (chronic kidney disease), stage III ([place])    "was told due to meds he takes"-no Renal workup done  . Colon polyps    adenomatous  . Coronary artery disease    remote CABG in 1985, cath in 2003 by Dr. Lia Foyer with no PCI, last nuclear in 2010 showing scar and EF of 28%.   . Diabetes mellitus   . Diabetic neuropathy ([place]) 04/25/2017  . Dyslipidemia   . Dyspnea   . Gait abnormality 04/25/2017  . Glaucoma 01-08-13   tx. eye drops  . Hemodialysis patient ([place])   . HOH (hard of hearing)   . HTN (hypertension)   . Hypercholesterolemia   .  Left ventricular dysfunction    28% per nuclear in 2010 and 35 to 40% per echo in 2010  . Myocardial infarction ([place])    1985  . Neuromuscular disorder ([place])    legs mild paralysis-able to walk"nerve damage"-legs- left leg brace   . Neuropathy   . Pneumonia    hx of several times years ago   . Spinal stenosis      Family History  Problem Relation Age of Onset  . Heart attack Father   . Heart disease Father   . Colon polyps Father   . Lung cancer Mother   . Diabetes Sister        x 2  . Heart disease Brother        x 2  . Bone cancer Brother   . Lung disease Neg Hx      Social History   Socioeconomic History  . Marital status: Married    Spouse name: Not on file  . Number of children: 2  . Years of education: Not on file  . Highest education level: Not on file  Occupational History  . Occupation: Theme park manager  Social Needs  . Financial resource strain: Not on file  . Food insecurity:    Worry: Not on file  Inability: Not on file  . Transportation needs:    Medical: Not on file    Non-medical: Not on file  Tobacco Use  . Smoking status: Former Smoker    Packs/day: 1.00    Years: 5.00    Pack years: 5.00    Types: Cigarettes, Pipe, Cigars    Last attempt to quit: 08/05/1958    Years since quitting: 59.7  . Smokeless tobacco: Never Used  Substance and Sexual Activity  . Alcohol use: No  . Drug use: No  . Sexual activity: Not on file  Lifestyle  . Physical activity:    Days per week: Not on file    Minutes per session: Not on file  . Stress: Not on file  Relationships  . Social connections:    Talks on phone: Not on file    Gets together: Not on file    Attends religious service: Not on file    Active member of club or organization: Not on file    Attends meetings of clubs or organizations: Not on file    Relationship status: Not on file  . Intimate partner violence:    Fear of current or ex partner: Not on file    Emotionally abused: Not on file     Physically abused: Not on file    Forced sexual activity: Not on file  Other Topics Concern  . Not on file  Social History Narrative   Originally from [place]. He has always lived in [place]. Prior travel to [place], [place], [place], [place] ,[place], & [place]. 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 Has adopted children. Has a dog currently. No bird exposure. No mold exposure. Enjoys reading & traveling.     Allergies  Allergen Reactions  . Codeine Other (See Comments)    anxiety  . Hydrocodone Other (See Comments)    Anxiety- can take in liquid form  . Pseudoephedrine Other (See Comments)    Causes heart to race  . Azithromycin Rash     Outpatient Medications Prior to Visit  Medication Sig Dispense Refill  . albuterol (PROVENTIL HFA;VENTOLIN HFA) 108 (90 Base) MCG/ACT inhaler Inhale 2 puffs into the lungs every 4 (four) hours as needed for wheezing or shortness of breath (cough, shortness of breath or wheezing.). 1 Inhaler 1  . allopurinol (ZYLOPRIM) 100 MG tablet Take 1 tablet (100 mg total) by mouth daily. 30 tablet 6  . amiodarone (PACERONE) 200 MG tablet Take 1 tablet (200 mg total) by mouth daily. (Patient taking differently: Take 200 mg by mouth at bedtime. ) 90 tablet 3  . aspirin EC 81 MG tablet Take 81 mg by mouth daily.     Marland Kitchen atorvastatin (LIPITOR) 10 MG tablet Take 10 mg by mouth daily.    . calcitRIOL (ROCALTROL) 0.25 MCG capsule Take 1 capsule (0.25 mcg total) by mouth every Monday, Wednesday, and Friday with hemodialysis. 60 capsule 0  . cetirizine (ZYRTEC) 10 MG tablet Take 10 mg by mouth at bedtime as needed (seasonal allergies).     Marland Kitchen dextromethorphan-guaiFENesin (MUCINEX DM) 30-600 MG 12hr tablet Take 1 tablet by mouth 2 (two) times daily as needed for cough. 20 tablet 0  . fluocinonide cream (LIDEX) 6.60 % Apply 1 application topically 2 (two) times daily as needed (for itchy rash).     Marland Kitchen lidocaine-prilocaine (EMLA) cream Apply 1 application topically daily  as needed for painful procedure/intervention.  11  . multivitamin (RENA-VIT) TABS tablet Take 1 tablet by mouth  at bedtime. 60 tablet 0  . polyethylene glycol (MIRALAX / GLYCOLAX) packet Take 17 g by mouth daily as needed for mild constipation. 14 each 0  . tadalafil (CIALIS) 20 MG tablet Take 20 mg by mouth daily as needed for erectile dysfunction. Do not exceed 2 doses in 7 days    . testosterone cypionate (DEPOTESTOTERONE CYPIONATE) 100 MG/ML injection Inject 400 mg into the muscle every 21 ( twenty-one) days. For IM use only    . triamcinolone (NASACORT AQ) 55 MCG/ACT AERO nasal inhaler Place 2 sprays into the nose daily as needed (seasonal allergies).     . zolpidem (AMBIEN) 10 MG tablet Take 5mg  at bedtime, take an additional 5mg  if needed for sleep. 30 tablet 1  . gabapentin (NEURONTIN) 300 MG capsule Take 1 capsule (300 mg total) by mouth at bedtime. 30 capsule 0   Facility-Administered Medications Prior to Visit  Medication Dose Route Frequency Provider Last Rate Last Dose  . omalizumab Arvid Right) injection 300 mg  300 mg Subcutaneous Q14 Days Simonne Maffucci B, MD   300 mg at 03/05/18 0944    ROS    Objective:  Physical Exam   Vitals:   04/08/18 1426  BP: 124/60  Pulse: 75  SpO2: 97%  Weight: 200 lb 9.6 oz (91 kg)  Height: 5' 10.28" (1.785 m)    Gen: well appearing HENT: OP clear, TM's clear, neck supple PULM: CTA B, normal percussion CV: RRR, systolic murmur noted, trace edema GI: BS+, soft, nontender Derm: no cyanosis or rash Psyche: normal mood and affect   CBC    Component Value Date/Time   WBC 8.2 12/19/2017 0830   RBC 3.08 (L) 12/19/2017 0830   HGB 8.8 (L) 12/19/2017 0830   HGB 11.5 (L) 10/27/2017 1222   HCT 28.5 (L) 12/19/2017 0830   HCT 36.4 (L) 10/27/2017 1222   PLT 123 (L) 12/19/2017 0830   PLT 100 (LL) 10/27/2017 1222   MCV 92.5 12/19/2017 0830   MCV 95 10/27/2017 1222   MCH 28.6 12/19/2017 0830   MCHC 30.9 12/19/2017 0830   RDW 14.5  12/19/2017 0830   RDW 15.6 (H) 10/27/2017 1222   LYMPHSABS 1.2 12/16/2017 0133   LYMPHSABS 0.6 (L) 10/27/2017 1222   MONOABS 1.1 12/16/2017 0133   EOSABS 0.1 12/16/2017 0133   EOSABS 0.0 10/27/2017 1222   BASOSABS 0.0 12/16/2017 0133   BASOSABS 0.0 10/27/2017 1222     PFT 01/22/16: FVC 2.97 L (72%) FEV1 2.23 L (76%) FEV1/FVC 0.75 FEF 25-75 1.16 L (81%) no bronchodilator response TLC 5.56 L (77%) RV 91% ERV 63% DLCO corrected 57% (hemoglobin 15.1) 08/30/13: FVC 3.84 L (91%) FEV1 2.85 L (94%) FEV1/FVC 0.74 FEF 25-75 2.10 L (98%) no bronchodilator response TLC 5.94 L (83%)                    ERV 84% DLCO uncorrected 64%  IMAGING CXR PA/LAT 10/11/14: No opacity or effusion appreciated. Heart normal in size. Pacemaker with leads noted. Low lung volumes.  V/Q 10/06/14 : Heterogeneous ventilation without focal defect. No evidence of pulmonary embolism.  CARDIAC TTE (10/06/14): LV mildly dilated. EF 35-40%. Regional wall motion abnormalities noted. LA moderately dilated & RA normal in size. RV normal in size and function. Pulmonary artery systolic pressure 47 mmHg. No aortic regurgitation or stenosis. Moderate mitral regurgitation without stenosis. No pulmonic regurgitation. Mild tricuspid regurgitation. No pericardial effusion.  LABS 07/07/13 IgE: 209.1 RAST Panel: [place] 0.28 (class 0/1)    Assessment &  Plan:   Moderate persistent asthma without complication  Discussion: This has been a stable interval for Mr. [name].  He is no longer taking inhaled medicines for his mild persistent asthma.  I explained to him today that I think it is reasonable start backing down on treatment and he agrees with this.  Plan: Mild persistent asthma: Decrease Xolair frequency to every 3 weeks If you have worsening shortness of breath, chest tightness or wheezing start taking albuterol 2 puffs every 4 to hours as needed and call me so that I can start a steroid inhaler  I will see you back in 3 months  and at that point we will consider whether or not you still need Xolair  High-dose flu shot today    Current Outpatient Medications:  .  albuterol (PROVENTIL HFA;VENTOLIN HFA) 108 (90 Base) MCG/ACT inhaler, Inhale 2 puffs into the lungs every 4 (four) hours as needed for wheezing or shortness of breath (cough, shortness of breath or wheezing.)., Disp: 1 Inhaler, Rfl: 1 .  allopurinol (ZYLOPRIM) 100 MG tablet, Take 1 tablet (100 mg total) by mouth daily., Disp: 30 tablet, Rfl: 6 .  amiodarone (PACERONE) 200 MG tablet, Take 1 tablet (200 mg total) by mouth daily. (Patient taking differently: Take 200 mg by mouth at bedtime. ), Disp: 90 tablet, Rfl: 3 .  aspirin EC 81 MG tablet, Take 81 mg by mouth daily. , Disp: , Rfl:  .  atorvastatin (LIPITOR) 10 MG tablet, Take 10 mg by mouth daily., Disp: , Rfl:  .  calcitRIOL (ROCALTROL) 0.25 MCG capsule, Take 1 capsule (0.25 mcg total) by mouth every Monday, Wednesday, and Friday with hemodialysis., Disp: 60 capsule, Rfl: 0 .  cetirizine (ZYRTEC) 10 MG tablet, Take 10 mg by mouth at bedtime as needed (seasonal allergies). , Disp: , Rfl:  .  dextromethorphan-guaiFENesin (MUCINEX DM) 30-600 MG 12hr tablet, Take 1 tablet by mouth 2 (two) times daily as needed for cough., Disp: 20 tablet, Rfl: 0 .  fluocinonide cream (LIDEX) 6.14 %, Apply 1 application topically 2 (two) times daily as needed (for itchy rash). , Disp: , Rfl:  .  lidocaine-prilocaine (EMLA) cream, Apply 1 application topically daily as needed for painful procedure/intervention., Disp: , Rfl: 11 .  multivitamin (RENA-VIT) TABS tablet, Take 1 tablet by mouth at bedtime., Disp: 60 tablet, Rfl: 0 .  polyethylene glycol (MIRALAX / GLYCOLAX) packet, Take 17 g by mouth daily as needed for mild constipation., Disp: 14 each, Rfl: 0 .  tadalafil (CIALIS) 20 MG tablet, Take 20 mg by mouth daily as needed for erectile dysfunction. Do not exceed 2 doses in 7 days, Disp: , Rfl:  .  testosterone cypionate  (DEPOTESTOTERONE CYPIONATE) 100 MG/ML injection, Inject 400 mg into the muscle every 21 ( twenty-one) days. For IM use only, Disp: , Rfl:  .  triamcinolone (NASACORT AQ) 55 MCG/ACT AERO nasal inhaler, Place 2 sprays into the nose daily as needed (seasonal allergies). , Disp: , Rfl:  .  zolpidem (AMBIEN) 10 MG tablet, Take 5mg  at bedtime, take an additional 5mg  if needed for sleep., Disp: 30 tablet, Rfl: 1  Current Facility-Administered Medications:  .  omalizumab (XOLAIR) injection 300 mg, 300 mg, Subcutaneous, Q14 Days, Makhayla Mcmurry B, MD, 300 mg at 03/05/18 [phone]

## 2018-04-08 NOTE — Addendum Note (Signed)
Addended by: Darreld Mclean on: 04/08/2018 04:00 PM   Modules accepted: Orders

## 2018-04-08 NOTE — Patient Instructions (Signed)
Mild persistent asthma: Decrease Xolair frequency to every 3 weeks If you have worsening shortness of breath, chest tightness or wheezing start taking albuterol 2 puffs every 4 to hours as needed and call me so that I can start a steroid inhaler  I will see you back in 3 months and at that point we will consider whether or not you still need Xolair  High-dose flu shot today

## 2018-04-08 NOTE — Progress Notes (Unsigned)
During office visit today Dr. Lake Bells saw patient and would like him to reduce his Xolair injections to every 3 weeks.  Routing to [employer].     Noted. Tammy S.

## 2018-04-09 MED ORDER — OMALIZUMAB 150 MG ~~LOC~~ SOLR
300.0000 mg | SUBCUTANEOUS | Status: DC
  Administered 2018-04-07: 300 mg via SUBCUTANEOUS

## 2018-04-09 NOTE — Progress Notes (Signed)
Documentation of medication administration and charges of Xolair have been completed by Lindsay Lemons, CMA based on the hand written Xolair documentation sheet completed by Tammy Scott, who administered the medication.  

## 2018-04-10 DIAGNOSIS — N2581 Secondary hyperparathyroidism of renal origin: Secondary | ICD-10-CM | POA: Diagnosis not present

## 2018-04-10 DIAGNOSIS — D631 Anemia in chronic kidney disease: Secondary | ICD-10-CM | POA: Diagnosis not present

## 2018-04-10 DIAGNOSIS — E1129 Type 2 diabetes mellitus with other diabetic kidney complication: Secondary | ICD-10-CM | POA: Diagnosis not present

## 2018-04-10 DIAGNOSIS — D509 Iron deficiency anemia, unspecified: Secondary | ICD-10-CM | POA: Diagnosis not present

## 2018-04-10 DIAGNOSIS — N186 End stage renal disease: Secondary | ICD-10-CM | POA: Diagnosis not present

## 2018-04-13 DIAGNOSIS — N2581 Secondary hyperparathyroidism of renal origin: Secondary | ICD-10-CM | POA: Diagnosis not present

## 2018-04-13 DIAGNOSIS — E1129 Type 2 diabetes mellitus with other diabetic kidney complication: Secondary | ICD-10-CM | POA: Diagnosis not present

## 2018-04-13 DIAGNOSIS — N186 End stage renal disease: Secondary | ICD-10-CM | POA: Diagnosis not present

## 2018-04-13 DIAGNOSIS — D631 Anemia in chronic kidney disease: Secondary | ICD-10-CM | POA: Diagnosis not present

## 2018-04-13 DIAGNOSIS — D509 Iron deficiency anemia, unspecified: Secondary | ICD-10-CM | POA: Diagnosis not present

## 2018-04-14 ENCOUNTER — Other Ambulatory Visit: Payer: Self-pay | Admitting: Internal Medicine

## 2018-04-14 DIAGNOSIS — F5101 Primary insomnia: Secondary | ICD-10-CM

## 2018-04-14 NOTE — Progress Notes (Signed)
Cardiology Office Note Date:  09/01/2017  Patient ID:  Shane [name], DOB February 20, 1934, MRN [id number] PCP:  [name], MD       Cardiologist:  Dr. Acie Fredrickson AHF: Dr. Haroldine Laws Electrophysiologist: Dr. Caryl Comes Nephrologist: Dr. Hollie Salk >> Dr. Jimmy Footman primarily     Chief Complaint: 6 mo/EP/device f/u  History of Present Illness: Shane [name] is a 84 y.o. male with history of CAD (remote CABG 1995, last cath in 2003), CM, chronic CHF (systolic), CRI (IV, has AVF not yet on HD), LBBB, March 2016 hospitalized w/VT underwent CRT-D implant, DM, HTN, HLD.  He was admitted to [facility] 08/22/17 after being woken by shock from his ICD. Given recent episodes of syncope and ICD therapies in the week or so prior he came in. The patient of late has been feeling well, no CP, palpitations, he denies any changes to his exertional capacity, no symptoms of PND or orthopnea. With his 2 syncopal events one while seated collapsed forward to the table, the other was in the BR and though he had fallen, this was Saturday last weekend and was called by our office informed he had been shocked, no changes were made to therapy, instructed not to drive, pending further recommendations from Dr. Caryl Comes.  His device was interrogated noting Battery stable + 25J shock this AM, appropriate for PMVT in VF zone, cycle length 170-278ms On 08/18/17 he has MMVT treated successfully with 2 rounds of ATP On 08/17/17 he had MMVT treated successfully with one ATP On 08/16/17 he had 2 ATP 2nd round accelerated to VF zone and got 25J shock successful RV threshold is high 4.25V/1.48ms, 98.9% LV pacing (BiVe 1.1%),  He was found markedly hypokalemic, with K+ 2.6 admitted to medicine team for electrolyte management given his CRI and started on amiodarone.  Given lack of any ischemic symptoms, and hypokalemia, no ischemic w/u was pursued.  He was discharged 08/28/17.  I saw him in Feb f/u after discharge, no near syncope or  syncope, no shocks,.  No rest SOB, he has a bed that inclines and is at his usual abut 25% incline (for years).  He suspected he may be retaining some fluid, saw his nephrologist the day prior who to gave him instructions to take an extra metolazone and potassium tablet daily PRN for weight gain/swelling parameters given to him.  He had not had any kind of CP, up to his hospital stay/shocks really had been feeling well, no ischemic symptoms.  His RV thresholds were down some, no arrhythmias/therapies, no reprogramming was done.  He was instructed to f/u with his diuretics as instructed by nephrology noting his OptiVol noted fluid OL.  He has been followed by Dr. Dannielle Burn and Dr. Caryl Comes in the months since.  He has been started on HD .  Had a hospital stay in May with gout R knee.  Most recently saw Dr. Acie Fredrickson late May, noting since starting on HD CHF much better/less symptoms, though still adjusting to dialysis feeling pretty wiped out afterwards.  TTE done then noted LVEF 40-45%, mild AS/MR  The patient reports the same as Dr. Acie Fredrickson, while the HD really knocks him out and generally feel bad on HD days, he no longer is dealing with any kind of SOB, and on his off dialysis days feels pretty well.  All in-all he thinks he is in a better place with the start up of HD.  He is off his cardiac meds, reporting very low BP with dialysis, still  sometimes unable to complete full sessions with very low BP, and tends to sleep the rest of the day.    He denies any kind of CP, palpitations, no cardiac awareness.  Outside of low BP/HD days, no dizziness, near syncope or syncope. Do rest SOB, no DOE, no symptoms or orthopnea or PND.  He has never fainted at HD, though when his BP gets so low that they have to get his feet up and slow/stop dialysis he feels very weak. He has HD M-W-F He rides his exercise bike on non-dialysis days, feels like he has pretty good capacity.  Device information: MDT CRT-D, implanted 10/11/14 +  hx of appropriate therapy for MMVT and PMVT, Jan 2019, in setting of hypokalemia AAD: amiodarone started Jan 2019  10/27/17: TSH 1.430 AST 25 ALT 35      Past Medical History:  Diagnosis Date  . Abnormal nuclear cardiac imaging test Nov 2010   moderate area of infarct in the inferior wall with only minimal reversibility and EF of 28%  . AICD (automatic cardioverter/defibrillator) present   . Allergic rhinitis 01-08-13   Uses nebulizer for chronic sinus issues and Mucinex.  . Arthritis    osteoarthritis   . Asthma    Extrinic  . Blood transfusion    ? at time of bypass surgery   . BPH (benign prostatic hyperplasia)   . Cellulitis of arm, left    MSSA  . CHF (congestive heart failure) ([place])   . CKD (chronic kidney disease), stage III ([place])    "was told due to meds he takes"-no Renal workup done  . Colon polyps    adenomatous  . Coronary artery disease    remote CABG in 1985, cath in 2003 by Dr. Lia Foyer with no PCI, last nuclear in 2010 showing scar and EF of 28%.   . Diabetes mellitus   . Diabetic neuropathy ([place]) 04/25/2017  . Dyslipidemia   . Dyspnea   . Gait abnormality 04/25/2017  . Glaucoma 01-08-13   tx. eye drops  . HOH (hard of hearing)   . HTN (hypertension)   . Hypercholesterolemia   . Left ventricular dysfunction    28% per nuclear in 2010 and 35 to 40% per echo in 2010  . Myocardial infarction ([place])    1985  . Neuromuscular disorder ([place])    legs mild paralysis-able to walk"nerve damage"-legs- left leg brace   . Neuropathy   . Pneumonia    hx of several times years ago   . Spinal stenosis          Past Surgical History:  Procedure Laterality Date  . A/V FISTULAGRAM Right 10/17/2016   Procedure: A/V Fistulagram;  Surgeon: Conrad Koosharem, MD;  Location: [facility];  Service: Cardiovascular;  Laterality: Right;  . BACK SURGERY     hx of back surgery x 4   . BASCILIC VEIN TRANSPOSITION Right 09/09/2016    Procedure: BASCILIC VEIN TRANSPOSITION Right Arm;  Surgeon: Rosetta Posner, MD;  Location: [facility] OR;  Service: Vascular;  Laterality: Right;  . BI-VENTRICULAR IMPLANTABLE CARDIOVERTER DEFIBRILLATOR N/A 10/10/2014   Procedure: BI-VENTRICULAR IMPLANTABLE CARDIOVERTER DEFIBRILLATOR  (CRT-D);  Surgeon: [name], MD;  Location: [facility];  Service: Cardiovascular;  Laterality: N/A;  . CARDIAC CATHETERIZATION  2003  . Cataract      recent cataract surgery 6'14  . COLONOSCOPY N/A 01/25/2013   Procedure: COLONOSCOPY;  Surgeon: Irene Shipper, MD;  Location: [facility];  Service: Endoscopy;  Laterality: N/A;  . CORONARY ARTERY BYPASS GRAFT  1985   x 5 vessels  . LUMBAR DISC SURGERY    . PERIPHERAL VASCULAR BALLOON ANGIOPLASTY Right 10/17/2016   Procedure: Peripheral Vascular Balloon Angioplasty;  Surgeon: Conrad Ch[place] Corner, MD;  Location: [facility];  Service: Cardiovascular;  Laterality: Right;  ARM VEINOUS AND CENTRAL VEIN  . PR POLYSOM 6/>YRS SLEEP 4/> ADDL PARAM ATTND  12/07/2015  . PROSTATE SURGERY    . TOTAL KNEE ARTHROPLASTY  08/01/2011   Procedure: TOTAL KNEE ARTHROPLASTY;  Surgeon: Johnn Hai;  Location: [facility] ORS;  Service: Orthopedics;  Laterality: Right;          Current Outpatient Medications  Medication Sig Dispense Refill  . acetaminophen (TYLENOL) 500 MG tablet Take 1,000 mg by mouth every 8 (eight) hours as needed (pain).     Marland Kitchen albuterol (PROVENTIL HFA;VENTOLIN HFA) 108 (90 Base) MCG/ACT inhaler Inhale 2 puffs into the lungs every 4 (four) hours as needed for wheezing or shortness of breath (cough, shortness of breath or wheezing.). 1 Inhaler 1  . amiodarone (PACERONE) 200 MG tablet Take 1 tablet (200 mg total) by mouth 2 (two) times daily. 60 tablet 0  . amLODipine (NORVASC) 5 MG tablet Take 1 tablet (5 mg total) by mouth daily. 30 tablet 6  . aspirin EC 81 MG tablet Take 81 mg by mouth daily.     Marland Kitchen atorvastatin (LIPITOR) 10 MG tablet Take 1 tablet (10 mg  total) by mouth daily. 90 tablet 3  . carvedilol (COREG) 6.25 MG tablet Take 1 tablet (6.25 mg total) by mouth 2 (two) times daily with a meal. 60 tablet 5  . cetirizine (ZYRTEC) 10 MG tablet Take 10 mg by mouth at bedtime as needed (seasonal allergies).     Marland Kitchen dextromethorphan-guaiFENesin (MUCINEX DM) 30-600 MG 12hr tablet Take 1 tablet by mouth 2 (two) times daily as needed for cough. 20 tablet 0  . febuxostat (ULORIC) 40 MG tablet Take 1 tablet (40 mg total) by mouth daily. 90 tablet 3  . fluocinonide cream (LIDEX) 6.22 % Apply 1 application topically 2 (two) times daily as needed (for itchy rash).     . gabapentin (NEURONTIN) 300 MG capsule Take 1 capsule (300 mg total) by mouth at bedtime. 30 capsule 1  . hydrALAZINE (APRESOLINE) 50 MG tablet Take 1 tablet (50 mg total) 3 (three) times daily by mouth. 90 tablet 6  . metolazone (ZAROXOLYN) 5 MG tablet Take 1 pill as directed by HF clinic if weight goes up 3 lbs overnight, or 5 lbs within one week. Call before taking. 15 tablet 3  . omalizumab (XOLAIR) 150 MG injection Inject 300 mg into the skin every 14 (fourteen) days. Last injection given 04/25/17    . polyethylene glycol (MIRALAX / GLYCOLAX) packet Take 17 g by mouth daily as needed. 14 each 0  . potassium chloride (MICRO-K) 10 MEQ CR capsule Take 1 capsule (10 mEq total) by mouth daily as needed (with each metolazone dose). 30 capsule 6  . tadalafil (CIALIS) 20 MG tablet Take 20 mg by mouth daily as needed for erectile dysfunction. Do not exceed 2 doses in 7 days    . testosterone cypionate (DEPOTESTOTERONE CYPIONATE) 100 MG/ML injection Inject 400 mg into the muscle every 21 ( twenty-one) days. For IM use only    . torsemide (DEMADEX) 20 MG tablet Take 5 tablets (100 mg total) by mouth daily. 150 tablet 6  . triamcinolone (NASACORT AQ) 55 MCG/ACT AERO  nasal inhaler Place 2 sprays into the nose daily as needed (seasonal allergies).     . zolpidem (AMBIEN) 10 MG tablet TAKE 1/2 TO 1  (ONE-HALF TO ONE) TABLET BY MOUTH AT BEDTIME AS NEEDED FOR SLEEP 30 tablet 1            Current Facility-Administered Medications  Medication Dose Route Frequency Provider Last Rate Last Dose  . omalizumab Arvid Right) injection 300 mg  300 mg Subcutaneous Q14 Days Javier Glazier, MD   300 mg at 04/25/17 1213  . omalizumab Arvid Right) injection 300 mg  300 mg Subcutaneous Q14 Days Javier Glazier, MD   300 mg at 05/12/17 0946  . omalizumab Arvid Right) injection 300 mg  300 mg Subcutaneous Q14 Days Javier Glazier, MD   300 mg at 06/20/17 6144  . omalizumab Arvid Right) injection 300 mg  300 mg Subcutaneous Q14 Days Parrett, Tammy S, NP   300 mg at 07/18/17 1358  . omalizumab Arvid Right) injection 300 mg  300 mg Subcutaneous Q14 Days Parrett, Tammy S, NP   300 mg at 08/19/17 1116    Allergies:   Codeine; Hydrocodone; Pseudoephedrine; and Azithromycin   Social History:  The patient  reports that he quit smoking about 59 years ago. His smoking use included cigarettes, pipe, and cigars. He has a 5.00 pack-year smoking history. he has never used smokeless tobacco. He reports that he does not drink alcohol or use drugs.   Family History:  The patient's family history includes Bone cancer in his brother; Colon polyps in his father; Diabetes in his sister; Heart attack in his father; Heart disease in his brother and father; Lung cancer in his mother.  ROS:  Please see the history of present illness.  All other systems are reviewed and otherwise negative.   PHYSICAL EXAM:  VS:  142/66, 84bpm, 199lbs  Well nourished, well developed, in no acute distress  HEENT: normocephalic, atraumatic  Neck: no JVD, carotid bruits or masses Cardiac:  RRR; no significant murmurs, no rubs, or gallops Lungs:  CTA b/l, no wheezing, rhonchi or rales  Abd: soft, nontender MS: no deformity, age appropriate atrophy Ext: trace if any edema, wears a brace R ankle, RUE AVF Skin: warm and dry, no rash Neuro:  No gross  deficits appreciated Psych: euthymic mood, full affect  ICD site is stable, no tethering or discomfort   EKG:  Not done today ICD interrogation done today and reviewed by myself: battery and lead measurements are stable.  RV thresholds are high, in review of his last check with dr. Caryl Comes, were as well.  He rarely RV paces,94.7% LV pacing only, only 5.2% RV pacing, he not device dependent, No V arrhythmias He had one AF event, is true AF, 18 minutes   01/01/18: TTE Study Conclusions - Left ventricle: The cavity size was mildly dilated. Systolic   function was mildly to moderately reduced. The estimated ejection   fraction was in the range of 40% to 45%. Doppler parameters are   consistent with abnormal left ventricular relaxation (grade 1   diastolic dysfunction). - Aortic valve: There was mild stenosis. Valve area (VTI): 2.69   cm^2. Valve area (Vmax): 2.42 cm^2. Valve area (Vmean): 2.25   cm^2. - Mitral valve: There was mild regurgitation. - Left atrium: The atrium was moderately dilated.  11/16/15: TTE Study Conclusions - Left ventricle: The cavity size was moderately dilated. Wall thickness was normal. Systolic function was mildly to moderately reduced. The estimated ejection fraction was in  the range of 40% to 45%. Findings consistent with left ventricular diastolic dysfunction. - Regional wall motion abnormality: Akinesis of the basal inferior and basal inferolateral myocardium. - Aortic valve: Borderline mild aortic stenosis. Moderately calcified annulus. Trileaflet; mildly thickened, mildly calcified leaflets. Peak velocity (S): 225 cm/s. Mean gradient (S): 12 mm Hg. Valve area (VTI): 2.02 cm^2. Valve area (Vmax): 2.13 cm^2. Valve area (Vmean): 2.08 cm^2. - Mitral valve: There was mild regurgitation. - Left atrium: The atrium was moderately dilated. - Right ventricle: Pacer wire or catheter noted in right ventricle. Systolic function was mildly  reduced. - Right atrium: Pacer wire or catheter noted in right atrium. - Tricuspid valve: There was mild regurgitation. - Pulmonary arteries: PA peak pressure: 31 mm Hg (S).    Recent Labs: 08/22/2017: ALT 17; TSH 1.243 08/25/2017: Magnesium 2.0 08/27/2017: B Natriuretic Peptide 668.3 08/28/2017: BUN 86; Creatinine, Ser 3.13; Hemoglobin 10.3; Platelets 132; Potassium 4.1; Sodium 132  No results found for requested labs within last 8760 hours.   Estimated Creatinine Clearance: 21 mL/min (A) (by C-G formula based on SCr of 3.13 mg/dL (H)).      Wt Readings from Last 3 Encounters:  08/27/17 207 lb 12.8 oz (94.3 kg)  07/24/17 204 lb (92.5 kg)  07/21/17 206 lb (93.4 kg)     Other studies reviewed: Additional studies/records reviewed today include: summarized above  ASSESSMENT AND PLAN:  1. PMVT, hx of VT     In environment of K+ 2.6 (Jan 2019)     None further     Continue Amiodarone     TSH/LFTs today     No pulm symptoms       2. ICD     RV thresholds are high, similar/better then last check     He RV paces minimally, impedance is good     His device was not programmed for auto threshold testing, I suspect 2/2 this     No reprogramming was done today  3. CAD      no anginal symptoms, he sees Dr. Acie Fredrickson in March      On ASA, statin (low dose), will defer to Dr. Acie Fredrickson      Off BB with hypotension since start up of HD  4. HTN    looks OK today, now with hypotension associated with HD  5. ICM, chronic CHF (mixed)    Last echo 40-45% (2016 was 35-40% by echo, 22% by myoview)     Off all his HF meds, now on dialysis with what sounds like markedly low BP with his HD sessions     Discussed perhaps resuming small BB dose on off dialysis days, for now, will hold off  6. ESRF now on HD  7. Single AF episode      Known to clinic, for now, monitor for burden, if more will need to visit a/c    Disposition: continue remotes, will see him in 45mo f/u on AF burden  particularly.  Sooner if needed.    Current medicines are reviewed at length with the patient today.  The patient did not have any concerns regarding medicines.  Venetia Night, PA-C 09/01/2017 7:50 AM     [address] [place] [address] [phone] (office)  [phone] (fax)

## 2018-04-15 DIAGNOSIS — D509 Iron deficiency anemia, unspecified: Secondary | ICD-10-CM | POA: Diagnosis not present

## 2018-04-15 DIAGNOSIS — N186 End stage renal disease: Secondary | ICD-10-CM | POA: Diagnosis not present

## 2018-04-15 DIAGNOSIS — D631 Anemia in chronic kidney disease: Secondary | ICD-10-CM | POA: Diagnosis not present

## 2018-04-15 DIAGNOSIS — E1129 Type 2 diabetes mellitus with other diabetic kidney complication: Secondary | ICD-10-CM | POA: Diagnosis not present

## 2018-04-15 DIAGNOSIS — N2581 Secondary hyperparathyroidism of renal origin: Secondary | ICD-10-CM | POA: Diagnosis not present

## 2018-04-15 NOTE — Telephone Encounter (Signed)
[place] Controlled Substance Database checked. Last filled on 03/16/18  Last OV 01/06/18. Acute.

## 2018-04-16 ENCOUNTER — Ambulatory Visit (INDEPENDENT_AMBULATORY_CARE_PROVIDER_SITE_OTHER): Payer: Medicare Other | Admitting: Podiatry

## 2018-04-16 DIAGNOSIS — B351 Tinea unguium: Secondary | ICD-10-CM

## 2018-04-16 DIAGNOSIS — E1151 Type 2 diabetes mellitus with diabetic peripheral angiopathy without gangrene: Secondary | ICD-10-CM

## 2018-04-16 NOTE — Progress Notes (Signed)
Subjective:  Patient ID: Shane [name], male    DOB: 08-25-1933,  MRN: [id number]  No chief complaint on file.   84 y.o. male presents  for diabetic foot care.  Denies numbness and tingling in their feet. Denies cramping in legs and thighs.  Review of Systems: Negative except as noted in the HPI. Denies N/V/F/Ch.  Past Medical History:  Diagnosis Date  . Abnormal nuclear cardiac imaging test Nov 2010   moderate area of infarct in the inferior wall with only minimal reversibility and EF of 28%  . AICD (automatic cardioverter/defibrillator) present   . Allergic rhinitis 01-08-13   Uses nebulizer for chronic sinus issues and Mucinex.  . Arthritis    osteoarthritis   . Asthma    Extrinic  . Blood transfusion    ? at time of bypass surgery   . BPH (benign prostatic hyperplasia)   . Cellulitis of arm, left    MSSA  . CHF (congestive heart failure) ([place])   . CKD (chronic kidney disease), stage III ([place])    "was told due to meds he takes"-no Renal workup done  . Colon polyps    adenomatous  . Coronary artery disease    remote CABG in 1985, cath in 2003 by Dr. Lia Foyer with no PCI, last nuclear in 2010 showing scar and EF of 28%.   . Diabetes mellitus   . Diabetic neuropathy ([place]) 04/25/2017  . Dyslipidemia   . Dyspnea   . Gait abnormality 04/25/2017  . Glaucoma 01-08-13   tx. eye drops  . Hemodialysis patient ([place])   . HOH (hard of hearing)   . HTN (hypertension)   . Hypercholesterolemia   . Left ventricular dysfunction    28% per nuclear in 2010 and 35 to 40% per echo in 2010  . Myocardial infarction ([place])    1985  . Neuromuscular disorder ([place])    legs mild paralysis-able to walk"nerve damage"-legs- left leg brace   . Neuropathy   . Pneumonia    hx of several times years ago   . Spinal stenosis     Current Outpatient Medications:  .  Albuterol Sulfate (PROAIR RESPICLICK) 782 (90 Base) MCG/ACT AEPB, Inhale 1-2 puffs into the lungs every 6 (six) hours as needed., Disp:  1 each, Rfl: 5 .  allopurinol (ZYLOPRIM) 100 MG tablet, Take 1 tablet (100 mg total) by mouth daily., Disp: 30 tablet, Rfl: 6 .  amiodarone (PACERONE) 200 MG tablet, Take 1 tablet (200 mg total) by mouth daily. (Patient taking differently: Take 200 mg by mouth at bedtime. ), Disp: 90 tablet, Rfl: 3 .  aspirin EC 81 MG tablet, Take 81 mg by mouth daily. , Disp: , Rfl:  .  atorvastatin (LIPITOR) 10 MG tablet, Take 10 mg by mouth daily., Disp: , Rfl:  .  calcitRIOL (ROCALTROL) 0.25 MCG capsule, Take 1 capsule (0.25 mcg total) by mouth every Monday, Wednesday, and Friday with hemodialysis., Disp: 60 capsule, Rfl: 0 .  cetirizine (ZYRTEC) 10 MG tablet, Take 10 mg by mouth at bedtime as needed (seasonal allergies). , Disp: , Rfl:  .  dextromethorphan-guaiFENesin (MUCINEX DM) 30-600 MG 12hr tablet, Take 1 tablet by mouth 2 (two) times daily as needed for cough., Disp: 20 tablet, Rfl: 0 .  fluocinonide cream (LIDEX) 9.56 %, Apply 1 application topically 2 (two) times daily as needed (for itchy rash). , Disp: , Rfl:  .  lidocaine-prilocaine (EMLA) cream, Apply 1 application topically daily as needed for painful procedure/intervention., Disp: ,  Rfl: 11 .  multivitamin (RENA-VIT) TABS tablet, Take 1 tablet by mouth at bedtime., Disp: 60 tablet, Rfl: 0 .  polyethylene glycol (MIRALAX / GLYCOLAX) packet, Take 17 g by mouth daily as needed for mild constipation., Disp: 14 each, Rfl: 0 .  tadalafil (CIALIS) 20 MG tablet, Take 20 mg by mouth daily as needed for erectile dysfunction. Do not exceed 2 doses in 7 days, Disp: , Rfl:  .  testosterone cypionate (DEPOTESTOTERONE CYPIONATE) 100 MG/ML injection, Inject 400 mg into the muscle every 21 ( twenty-one) days. For IM use only, Disp: , Rfl:  .  triamcinolone (NASACORT AQ) 55 MCG/ACT AERO nasal inhaler, Place 2 sprays into the nose daily as needed (seasonal allergies). , Disp: , Rfl:  .  zolpidem (AMBIEN) 10 MG tablet, TAKE 1/2 TABLET (5 MG) BY MOUTH AT BEDTIME, TAKE  AN ADDITIONAL 1/2 TABLET (5 MG) IF NEEDED FOR SLEEP., Disp: 30 tablet, Rfl: 0  Current Facility-Administered Medications:  .  omalizumab (XOLAIR) injection 300 mg, 300 mg, Subcutaneous, Q14 Days, McQuaid, Douglas B, MD, 300 mg at 03/05/18 0944 .  omalizumab Arvid Right) injection 300 mg, 300 mg, Subcutaneous, Q14 Days, McQuaid, Douglas B, MD, 300 mg at 04/07/18 1606  Social History   Tobacco Use  Smoking Status Former Smoker  . Packs/day: 1.00  . Years: 5.00  . Pack years: 5.00  . Types: Cigarettes, Pipe, Cigars  . Last attempt to quit: 08/05/1958  . Years since quitting: 59.7  Smokeless Tobacco Never Used    Allergies  Allergen Reactions  . Codeine Other (See Comments)    anxiety  . Hydrocodone Other (See Comments)    Anxiety- can take in liquid form  . Pseudoephedrine Other (See Comments)    Causes heart to race  . Azithromycin Rash   Objective:  There were no vitals filed for this visit. There is no height or weight on file to calculate BMI. Constitutional Well developed. Well nourished.  Vascular Dorsalis pedis pulses present 1+ bilaterally  Posterior tibial pulses absent bilaterally  Pedal hair growth diminished. Capillary refill normal to all digits.  No cyanosis or clubbing noted.  Neurologic Normal speech. Oriented to person, place, and time. Epicritic sensation to light touch grossly present bilaterally. Protective sensation with 5.07 monofilament  present bilaterally. Vibratory sensation present bilaterally.  Dermatologic Nails elongated, thickened, dystrophic. No open wounds. No skin lesions.  Orthopedic: Normal joint ROM without pain or crepitus bilaterally. No visible deformities. No bony tenderness.   Assessment:   1. Diabetes mellitus type 2 with peripheral artery disease ([place])   2. Onychomycosis    Plan:  Patient was evaluated and treated and all questions answered.  Diabetes with PAD, Onychomycosis -Educated on diabetic footcare. Diabetic risk  level 1 -Nails x10 debrided sharply and manually with large nail nipper and rotary burr.   Procedure: Nail Debridement Rationale: Patient meets criteria for routine foot care due to PAD Type of Debridement: manual, sharp debridement. Instrumentation: Nail nipper, rotary burr. Number of Nails: 10  Return in about 9 weeks (around 06/18/2018) for Diabetic Foot Care.

## 2018-04-17 DIAGNOSIS — N186 End stage renal disease: Secondary | ICD-10-CM | POA: Diagnosis not present

## 2018-04-17 DIAGNOSIS — D509 Iron deficiency anemia, unspecified: Secondary | ICD-10-CM | POA: Diagnosis not present

## 2018-04-17 DIAGNOSIS — N2581 Secondary hyperparathyroidism of renal origin: Secondary | ICD-10-CM | POA: Diagnosis not present

## 2018-04-17 DIAGNOSIS — E1129 Type 2 diabetes mellitus with other diabetic kidney complication: Secondary | ICD-10-CM | POA: Diagnosis not present

## 2018-04-17 DIAGNOSIS — D631 Anemia in chronic kidney disease: Secondary | ICD-10-CM | POA: Diagnosis not present

## 2018-04-20 DIAGNOSIS — N186 End stage renal disease: Secondary | ICD-10-CM | POA: Diagnosis not present

## 2018-04-20 DIAGNOSIS — D631 Anemia in chronic kidney disease: Secondary | ICD-10-CM | POA: Diagnosis not present

## 2018-04-20 DIAGNOSIS — E1129 Type 2 diabetes mellitus with other diabetic kidney complication: Secondary | ICD-10-CM | POA: Diagnosis not present

## 2018-04-20 DIAGNOSIS — N2581 Secondary hyperparathyroidism of renal origin: Secondary | ICD-10-CM | POA: Diagnosis not present

## 2018-04-20 DIAGNOSIS — D509 Iron deficiency anemia, unspecified: Secondary | ICD-10-CM | POA: Diagnosis not present

## 2018-04-21 ENCOUNTER — Ambulatory Visit: Payer: Medicare Other

## 2018-04-21 ENCOUNTER — Ambulatory Visit (INDEPENDENT_AMBULATORY_CARE_PROVIDER_SITE_OTHER): Payer: Medicare Other | Admitting: Physician Assistant

## 2018-04-21 VITALS — BP 142/66 | HR 84 | Ht 70.0 in | Wt 199.0 lb

## 2018-04-21 DIAGNOSIS — Z79899 Other long term (current) drug therapy: Secondary | ICD-10-CM | POA: Diagnosis not present

## 2018-04-21 DIAGNOSIS — I10 Essential (primary) hypertension: Secondary | ICD-10-CM | POA: Diagnosis not present

## 2018-04-21 DIAGNOSIS — I5022 Chronic systolic (congestive) heart failure: Secondary | ICD-10-CM | POA: Diagnosis not present

## 2018-04-21 DIAGNOSIS — Z95810 Presence of automatic (implantable) cardiac defibrillator: Secondary | ICD-10-CM | POA: Diagnosis not present

## 2018-04-21 DIAGNOSIS — I255 Ischemic cardiomyopathy: Secondary | ICD-10-CM

## 2018-04-21 NOTE — Patient Instructions (Addendum)
Medication Instructions:   Your physician recommends that you continue on your current medications as directed. Please refer to the Current Medication list given to you today.   If you need a refill on your cardiac medications before your next appointment, please call your pharmacy.  Labwork:  TSH AND LFT TODAY     Testing/Procedures: NONE ORDERED  TODAY   Follow-Up: IN 3 MONTHS WITH URSUY  Remote monitoring is used to monitor your Pacemaker of ICD from home. This monitoring reduces the number of office visits required to check your device to one time per year. It allows [place] to keep an eye on the functioning of your device to ensure it is working properly. You are scheduled for a device check from home on . 05-28-18  You may send your transmission at any time that day. If you have a wireless device, the transmission will be sent automatically. After your physician reviews your transmission, you will receive a postcard with your next transmission date.       Any Other Special Instructions Will Be Listed Below (If Applicable).

## 2018-04-22 ENCOUNTER — Telehealth: Payer: Self-pay | Admitting: Pulmonary Disease

## 2018-04-22 DIAGNOSIS — N186 End stage renal disease: Secondary | ICD-10-CM | POA: Diagnosis not present

## 2018-04-22 DIAGNOSIS — D509 Iron deficiency anemia, unspecified: Secondary | ICD-10-CM | POA: Diagnosis not present

## 2018-04-22 DIAGNOSIS — E1129 Type 2 diabetes mellitus with other diabetic kidney complication: Secondary | ICD-10-CM | POA: Diagnosis not present

## 2018-04-22 DIAGNOSIS — D631 Anemia in chronic kidney disease: Secondary | ICD-10-CM | POA: Diagnosis not present

## 2018-04-22 DIAGNOSIS — N2581 Secondary hyperparathyroidism of renal origin: Secondary | ICD-10-CM | POA: Diagnosis not present

## 2018-04-22 LAB — HEPATIC FUNCTION PANEL
ALBUMIN: 4.5 g/dL (ref 3.5–4.7)
ALK PHOS: 90 IU/L (ref 39–117)
ALT: 22 IU/L (ref 0–44)
AST: 19 IU/L (ref 0–40)
BILIRUBIN TOTAL: 0.6 mg/dL (ref 0.0–1.2)
Bilirubin, Direct: 0.19 mg/dL (ref 0.00–0.40)
Total Protein: 7 g/dL (ref 6.0–8.5)

## 2018-04-22 LAB — TSH: TSH: 1.1 u[IU]/mL (ref 0.450–4.500)

## 2018-04-22 NOTE — Telephone Encounter (Signed)
Pt request message be left on vm with the strength of flu shot.

## 2018-04-22 NOTE — Telephone Encounter (Signed)
Pt is returning call. Cb is [phone].

## 2018-04-22 NOTE — Telephone Encounter (Signed)
Left message informing patient, he received the high dose flu shot. Informed patient to call back if he had any further questions. Nothing further needed at this time.

## 2018-04-22 NOTE — Telephone Encounter (Signed)
Called patient unable to reach left message to give [place] a call back. Patient received the high dose flu shot.

## 2018-04-23 ENCOUNTER — Telehealth: Payer: Self-pay | Admitting: *Deleted

## 2018-04-23 NOTE — Telephone Encounter (Signed)
-----   Message from [facility], [place] sent at 04/22/2018  4:19 PM EDT ----- Liver enzymes and TSH (thyroid lab) are wnl, monitoring for his amiodarone.  Thanks renee

## 2018-04-23 NOTE — Telephone Encounter (Signed)
LMOVM OF NORMAL RESULTS CONTACT CLINIC BACK IF ANY QUESTIONS OR CONCERNS

## 2018-04-24 DIAGNOSIS — N186 End stage renal disease: Secondary | ICD-10-CM | POA: Diagnosis not present

## 2018-04-24 DIAGNOSIS — D631 Anemia in chronic kidney disease: Secondary | ICD-10-CM | POA: Diagnosis not present

## 2018-04-24 DIAGNOSIS — E1129 Type 2 diabetes mellitus with other diabetic kidney complication: Secondary | ICD-10-CM | POA: Diagnosis not present

## 2018-04-24 DIAGNOSIS — D509 Iron deficiency anemia, unspecified: Secondary | ICD-10-CM | POA: Diagnosis not present

## 2018-04-24 DIAGNOSIS — N2581 Secondary hyperparathyroidism of renal origin: Secondary | ICD-10-CM | POA: Diagnosis not present

## 2018-04-27 DIAGNOSIS — E1129 Type 2 diabetes mellitus with other diabetic kidney complication: Secondary | ICD-10-CM | POA: Diagnosis not present

## 2018-04-27 DIAGNOSIS — N2581 Secondary hyperparathyroidism of renal origin: Secondary | ICD-10-CM | POA: Diagnosis not present

## 2018-04-27 DIAGNOSIS — D631 Anemia in chronic kidney disease: Secondary | ICD-10-CM | POA: Diagnosis not present

## 2018-04-27 DIAGNOSIS — N186 End stage renal disease: Secondary | ICD-10-CM | POA: Diagnosis not present

## 2018-04-27 DIAGNOSIS — D509 Iron deficiency anemia, unspecified: Secondary | ICD-10-CM | POA: Diagnosis not present

## 2018-04-28 ENCOUNTER — Ambulatory Visit (INDEPENDENT_AMBULATORY_CARE_PROVIDER_SITE_OTHER): Payer: Medicare Other

## 2018-04-28 DIAGNOSIS — E291 Testicular hypofunction: Secondary | ICD-10-CM | POA: Diagnosis not present

## 2018-04-28 DIAGNOSIS — J4540 Moderate persistent asthma, uncomplicated: Secondary | ICD-10-CM | POA: Diagnosis not present

## 2018-04-28 MED ORDER — OMALIZUMAB 150 MG ~~LOC~~ SOLR
300.0000 mg | SUBCUTANEOUS | Status: DC
  Administered 2018-04-28 – 2018-06-09 (×3): 300 mg via SUBCUTANEOUS

## 2018-04-28 MED ORDER — OMALIZUMAB 150 MG ~~LOC~~ SOLR: 300.0000 mg | SUBCUTANEOUS | Status: DC

## 2018-04-28 NOTE — Progress Notes (Signed)
Documentation of medication administration and charges of Xolair have been completed by Orissa Arreaga, CMA based on the hand written Xolair documentation sheet completed by Tammy Scott, who administered the medication.  

## 2018-04-29 DIAGNOSIS — D509 Iron deficiency anemia, unspecified: Secondary | ICD-10-CM | POA: Diagnosis not present

## 2018-04-29 DIAGNOSIS — N2581 Secondary hyperparathyroidism of renal origin: Secondary | ICD-10-CM | POA: Diagnosis not present

## 2018-04-29 DIAGNOSIS — N186 End stage renal disease: Secondary | ICD-10-CM | POA: Diagnosis not present

## 2018-04-29 DIAGNOSIS — D631 Anemia in chronic kidney disease: Secondary | ICD-10-CM | POA: Diagnosis not present

## 2018-04-29 DIAGNOSIS — E1129 Type 2 diabetes mellitus with other diabetic kidney complication: Secondary | ICD-10-CM | POA: Diagnosis not present

## 2018-04-30 DIAGNOSIS — M25562 Pain in left knee: Secondary | ICD-10-CM | POA: Diagnosis not present

## 2018-04-30 DIAGNOSIS — M1712 Unilateral primary osteoarthritis, left knee: Secondary | ICD-10-CM | POA: Diagnosis not present

## 2018-05-01 DIAGNOSIS — D509 Iron deficiency anemia, unspecified: Secondary | ICD-10-CM | POA: Diagnosis not present

## 2018-05-01 DIAGNOSIS — N2581 Secondary hyperparathyroidism of renal origin: Secondary | ICD-10-CM | POA: Diagnosis not present

## 2018-05-01 DIAGNOSIS — N186 End stage renal disease: Secondary | ICD-10-CM | POA: Diagnosis not present

## 2018-05-01 DIAGNOSIS — D631 Anemia in chronic kidney disease: Secondary | ICD-10-CM | POA: Diagnosis not present

## 2018-05-01 DIAGNOSIS — E1129 Type 2 diabetes mellitus with other diabetic kidney complication: Secondary | ICD-10-CM | POA: Diagnosis not present

## 2018-05-04 ENCOUNTER — Ambulatory Visit: Payer: Medicare Other | Admitting: Internal Medicine

## 2018-05-04 DIAGNOSIS — N186 End stage renal disease: Secondary | ICD-10-CM | POA: Diagnosis not present

## 2018-05-05 DIAGNOSIS — N186 End stage renal disease: Secondary | ICD-10-CM | POA: Diagnosis not present

## 2018-05-05 DIAGNOSIS — I129 Hypertensive chronic kidney disease with stage 1 through stage 4 chronic kidney disease, or unspecified chronic kidney disease: Secondary | ICD-10-CM | POA: Diagnosis not present

## 2018-05-05 DIAGNOSIS — Z992 Dependence on renal dialysis: Secondary | ICD-10-CM | POA: Diagnosis not present

## 2018-05-06 DIAGNOSIS — N186 End stage renal disease: Secondary | ICD-10-CM | POA: Diagnosis not present

## 2018-05-08 DIAGNOSIS — N186 End stage renal disease: Secondary | ICD-10-CM | POA: Diagnosis not present

## 2018-05-11 DIAGNOSIS — N2581 Secondary hyperparathyroidism of renal origin: Secondary | ICD-10-CM | POA: Diagnosis not present

## 2018-05-11 DIAGNOSIS — E1129 Type 2 diabetes mellitus with other diabetic kidney complication: Secondary | ICD-10-CM | POA: Diagnosis not present

## 2018-05-11 DIAGNOSIS — D631 Anemia in chronic kidney disease: Secondary | ICD-10-CM | POA: Diagnosis not present

## 2018-05-11 DIAGNOSIS — D509 Iron deficiency anemia, unspecified: Secondary | ICD-10-CM | POA: Diagnosis not present

## 2018-05-11 DIAGNOSIS — N186 End stage renal disease: Secondary | ICD-10-CM | POA: Diagnosis not present

## 2018-05-13 ENCOUNTER — Telehealth: Payer: Self-pay | Admitting: Pulmonary Disease

## 2018-05-13 DIAGNOSIS — D631 Anemia in chronic kidney disease: Secondary | ICD-10-CM | POA: Diagnosis not present

## 2018-05-13 DIAGNOSIS — D509 Iron deficiency anemia, unspecified: Secondary | ICD-10-CM | POA: Diagnosis not present

## 2018-05-13 DIAGNOSIS — N2581 Secondary hyperparathyroidism of renal origin: Secondary | ICD-10-CM | POA: Diagnosis not present

## 2018-05-13 DIAGNOSIS — E1129 Type 2 diabetes mellitus with other diabetic kidney complication: Secondary | ICD-10-CM | POA: Diagnosis not present

## 2018-05-13 DIAGNOSIS — N186 End stage renal disease: Secondary | ICD-10-CM | POA: Diagnosis not present

## 2018-05-13 NOTE — Telephone Encounter (Signed)
Prefilled Syringes: # 150mg  4  #75mg  0 Arrival Date: 05/13/18 Lot #: 150mg  [id number]      75mg  0 Exp Date: 150mg  10/2018   75mg  0

## 2018-05-13 NOTE — Telephone Encounter (Signed)
Immunization record printed and faxed to attn jolene

## 2018-05-15 DIAGNOSIS — N186 End stage renal disease: Secondary | ICD-10-CM | POA: Diagnosis not present

## 2018-05-15 DIAGNOSIS — N2581 Secondary hyperparathyroidism of renal origin: Secondary | ICD-10-CM | POA: Diagnosis not present

## 2018-05-18 ENCOUNTER — Telehealth: Payer: Self-pay | Admitting: Pulmonary Disease

## 2018-05-18 DIAGNOSIS — N186 End stage renal disease: Secondary | ICD-10-CM | POA: Diagnosis not present

## 2018-05-18 DIAGNOSIS — E1129 Type 2 diabetes mellitus with other diabetic kidney complication: Secondary | ICD-10-CM | POA: Diagnosis not present

## 2018-05-18 DIAGNOSIS — D631 Anemia in chronic kidney disease: Secondary | ICD-10-CM | POA: Diagnosis not present

## 2018-05-18 DIAGNOSIS — D509 Iron deficiency anemia, unspecified: Secondary | ICD-10-CM | POA: Diagnosis not present

## 2018-05-18 DIAGNOSIS — N2581 Secondary hyperparathyroidism of renal origin: Secondary | ICD-10-CM | POA: Diagnosis not present

## 2018-05-18 NOTE — Telephone Encounter (Signed)
Pt's immunization records have been faxed to [facility] in [place]. Received a confirmation that the fax went through.  Called and spoke with Melissa letting her know this had been done.  Melissa expressed understanding. Nothing further needed.

## 2018-05-18 NOTE — Progress Notes (Deleted)
Subjective:    Patient ID: Shane [name], male    DOB: [birth year], 84 y.o.   MRN: [id number]  HPI The patient is here for follow up.  CAD, Hypertension, chronic combined CHF: He is taking his medication daily. He is compliant with a low sodium diet.  He denies chest pain, palpitations, edema, shortness of breath and regular headaches. He is exercising regularly.  He does not monitor his blood pressure at home.    Diabetes: He is taking his medication daily as prescribed. He is compliant with a diabetic diet. He is exercising regularly. He monitors his sugars and they have been running XXX. He checks his feet daily and denies foot lesions. He is up-to-date with an ophthalmology examination.   Gout:  He is taking allopurinol daily.    CKD:   Medications and allergies reviewed with patient and updated if appropriate.  Patient Active Problem List   Diagnosis Date Noted  . Cellulitis of right foot 01/06/2018  . Gout of left knee 12/16/2017  . Fatigue 09/08/2017  . Hypokalemia 08/22/2017  . Elevated troponin 08/22/2017  . ICD (implantable cardioverter-defibrillator) discharge 08/22/2017  . Ventricular tachycardia ([place]) 08/22/2017  . Chronic gout of left knee 07/21/2017  . Fever 05/28/2017  . Diabetic neuropathy ([place]) 04/25/2017  . Gait abnormality 04/25/2017  . Numbness and tingling of both feet 03/04/2017  . Peripheral neuropathy 03/04/2017  . Carotid artery disease ([place]) 01/09/2017  . Chronic combined systolic and diastolic CHF (congestive heart failure) ([place]) 07/01/2016  . Restrictive lung disease 01/22/2016  . Severe obstructive sleep apnea 12/17/2015  . Essential hypertension 11/16/2015  . 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 LBBB (left bundle branch block) 10/10/2014  . Syncope 10/06/2014  . Pain in thumb joint with movement of right hand 01/04/2014  . Sinusitis, chronic 07/16/2013  . Asthma, moderate persistent 07/08/2013  . Benign prostatic hyperplasia without urinary  obstruction 12/18/2011  . CAD s/p coronary arthery bypass graft   . Chronic kidney disease (CKD), stage IV (severe) ([place])   . H/O Spinal stenosis   . DM (diabetes mellitus), type 2 with renal complications ([place]) 82/42/3536  . Benign hypertensive heart disease without heart failure 11/07/2010  . Allergic rhinitis 11/07/2010  . Osteoarthritis 11/07/2010  . Ischemic cardiomyopathy   . Hypercholesterolemia     Current Outpatient Medications on File Prior to Visit  Medication Sig Dispense Refill  . Albuterol Sulfate (PROAIR RESPICLICK) 144 (90 Base) MCG/ACT AEPB Inhale 1-2 puffs into the lungs every 6 (six) hours as needed. 1 each 5  . allopurinol (ZYLOPRIM) 100 MG tablet Take 1 tablet (100 mg total) by mouth daily. 30 tablet 6  . amiodarone (PACERONE) 200 MG tablet Take 1 tablet (200 mg total) by mouth daily. (Patient taking differently: Take 200 mg by mouth at bedtime. ) 90 tablet 3  . aspirin EC 81 MG tablet Take 81 mg by mouth daily.     Marland Kitchen atorvastatin (LIPITOR) 10 MG tablet Take 10 mg by mouth daily.    . calcitRIOL (ROCALTROL) 0.25 MCG capsule Take 1 capsule (0.25 mcg total) by mouth every Monday, Wednesday, and Friday with hemodialysis. 60 capsule 0  . cetirizine (ZYRTEC) 10 MG tablet Take 10 mg by mouth at bedtime as needed (seasonal allergies).     Marland Kitchen dextromethorphan-guaiFENesin (MUCINEX DM) 30-600 MG 12hr tablet Take 1 tablet by mouth 2 (two) times daily as needed for cough. 20 tablet 0  . fluocinonide cream (LIDEX) 3.15 % Apply 1 application topically 2 (two) times  daily as needed (for itchy rash).     Marland Kitchen lidocaine-prilocaine (EMLA) cream Apply 1 application topically daily as needed for painful procedure/intervention.  11  . multivitamin (RENA-VIT) TABS tablet Take 1 tablet by mouth at bedtime. 60 tablet 0  . polyethylene glycol (MIRALAX / GLYCOLAX) packet Take 17 g by mouth daily as needed for mild constipation. 14 each 0  . tadalafil (CIALIS) 20 MG tablet Take 20 mg by mouth daily  as needed for erectile dysfunction. Do not exceed 2 doses in 7 days    . testosterone cypionate (DEPOTESTOTERONE CYPIONATE) 100 MG/ML injection Inject 400 mg into the muscle every 21 ( twenty-one) days. For IM use only    . triamcinolone (NASACORT AQ) 55 MCG/ACT AERO nasal inhaler Place 2 sprays into the nose daily as needed (seasonal allergies).     . zolpidem (AMBIEN) 10 MG tablet TAKE 1/2 TABLET (5 MG) BY MOUTH AT BEDTIME, TAKE AN ADDITIONAL 1/2 TABLET (5 MG) IF NEEDED FOR SLEEP. 30 tablet 0   Current Facility-Administered Medications on File Prior to Visit  Medication Dose Route Frequency Provider Last Rate Last Dose  . omalizumab Arvid Right) injection 300 mg  300 mg Subcutaneous Q14 Days Simonne Maffucci B, MD   300 mg at 03/05/18 0944  . omalizumab Arvid Right) injection 300 mg  300 mg Subcutaneous Q14 Days Simonne Maffucci B, MD   300 mg at 04/07/18 1606  . omalizumab Arvid Right) injection 300 mg  300 mg Subcutaneous Q14 Days Juanito Doom, MD   300 mg at 04/28/18 1345    Past Medical History:  Diagnosis Date  . Abnormal nuclear cardiac imaging test Nov 2010   moderate area of infarct in the inferior wall with only minimal reversibility and EF of 28%  . AICD (automatic cardioverter/defibrillator) present   . Allergic rhinitis 01-08-13   Uses nebulizer for chronic sinus issues and Mucinex.  . Arthritis    osteoarthritis   . Asthma    Extrinic  . Blood transfusion    ? at time of bypass surgery   . BPH (benign prostatic hyperplasia)   . Cellulitis of arm, left    MSSA  . CHF (congestive heart failure) ([place])   . CKD (chronic kidney disease), stage III ([place])    "was told due to meds he takes"-no Renal workup done  . Colon polyps    adenomatous  . Coronary artery disease    remote CABG in 1985, cath in 2003 by Dr. Lia Foyer with no PCI, last nuclear in 2010 showing scar and EF of 28%.   . Diabetes mellitus   . Diabetic neuropathy ([place]) 04/25/2017  . Dyslipidemia   . Dyspnea   . Gait  abnormality 04/25/2017  . Glaucoma 01-08-13   tx. eye drops  . Hemodialysis patient ([place])   . HOH (hard of hearing)   . HTN (hypertension)   . Hypercholesterolemia   . Left ventricular dysfunction    28% per nuclear in 2010 and 35 to 40% per echo in 2010  . Myocardial infarction ([place])    1985  . Neuromuscular disorder ([place])    legs mild paralysis-able to walk"nerve damage"-legs- left leg brace   . Neuropathy   . Pneumonia    hx of several times years ago   . Spinal stenosis     Past Surgical History:  Procedure Laterality Date  . A/V FISTULAGRAM Right 10/17/2016   Procedure: A/V Fistulagram;  Surgeon: Conrad Raynham [place], MD;  Location: [facility];  Service:  Cardiovascular;  Laterality: Right;  . BACK SURGERY     hx of back surgery x 4   . BASCILIC VEIN TRANSPOSITION Right 09/09/2016   Procedure: BASCILIC VEIN TRANSPOSITION Right Arm;  Surgeon: Rosetta Posner, MD;  Location: [facility] OR;  Service: Vascular;  Laterality: Right;  . BI-VENTRICULAR IMPLANTABLE CARDIOVERTER DEFIBRILLATOR N/A 10/10/2014   Procedure: BI-VENTRICULAR IMPLANTABLE CARDIOVERTER DEFIBRILLATOR  (CRT-D);  Surgeon: [name], MD;  Location: [facility] CATH LAB;  Service: Cardiovascular;  Laterality: N/A;  . CARDIAC CATHETERIZATION  2003  . Cataract      recent cataract surgery 6'14  . COLONOSCOPY N/A 01/25/2013   Procedure: COLONOSCOPY;  Surgeon: Irene Shipper, MD;  Location: [facility];  Service: Endoscopy;  Laterality: N/A;  . CORONARY ARTERY BYPASS GRAFT  1985   x 5 vessels  . LUMBAR DISC SURGERY    . PERIPHERAL VASCULAR BALLOON ANGIOPLASTY Right 10/17/2016   Procedure: Peripheral Vascular Balloon Angioplasty;  Surgeon: Conrad Bolivar, MD;  Location: [facility];  Service: Cardiovascular;  Laterality: Right;  ARM VEINOUS AND CENTRAL VEIN  . PR POLYSOM 6/>YRS SLEEP 4/> ADDL PARAM ATTND  12/07/2015  . PROSTATE SURGERY    . TOTAL KNEE ARTHROPLASTY  08/01/2011   Procedure: TOTAL KNEE ARTHROPLASTY;  Surgeon: Johnn Hai;   Location: [facility] ORS;  Service: Orthopedics;  Laterality: Right;    Social History   Socioeconomic History  . Marital status: Married    Spouse name: Not on file  . Number of children: 2  . Years of education: Not on file  . Highest education level: Not on file  Occupational History  . Occupation: Theme park manager  Social Needs  . Financial resource strain: Not on file  . Food insecurity:    Worry: Not on file    Inability: Not on file  . Transportation needs:    Medical: Not on file    Non-medical: Not on file  Tobacco Use  . Smoking status: Former Smoker    Packs/day: 1.00    Years: 5.00    Pack years: 5.00    Types: Cigarettes, Pipe, Cigars    Last attempt to quit: 08/05/1958    Years since quitting: 59.8  . Smokeless tobacco: Never Used  Substance and Sexual Activity  . Alcohol use: No  . Drug use: No  . Sexual activity: Not on file  Lifestyle  . Physical activity:    Days per week: Not on file    Minutes per session: Not on file  . Stress: Not on file  Relationships  . Social connections:    Talks on phone: Not on file    Gets together: Not on file    Attends religious service: Not on file    Active member of club or organization: Not on file    Attends meetings of clubs or organizations: Not on file    Relationship status: Not on file  Other Topics Concern  . Not on file  Social History Narrative   Originally from [place]. He has always lived in [place]. Prior travel to [place], [place], [place], [place] ,[place], & [place]. 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 Has adopted children. Has a dog currently. No bird exposure. No mold exposure. Enjoys reading & traveling.    Family History  Problem Relation Age of Onset  . Heart attack Father   . Heart disease Father   . Colon polyps Father   . Lung cancer Mother   . Diabetes Sister  x 2  . Heart disease Brother        x 2  . Bone cancer Brother   . Lung disease Neg Hx     Review of Systems       Objective:  There were no vitals filed for this visit. BP Readings from Last 3 Encounters:  04/21/18 (!) 142/66  04/08/18 124/60  01/06/18 (!) 124/50   Wt Readings from Last 3 Encounters:  04/21/18 199 lb (90.3 kg)  04/08/18 200 lb 9.6 oz (91 kg)  01/06/18 196 lb (88.9 kg)   There is no height or weight on file to calculate BMI.   Physical Exam    Constitutional: Appears well-developed and well-nourished. No distress.  HENT:  Head: Normocephalic and atraumatic.  Neck: Neck supple. No tracheal deviation present. No thyromegaly present.  No cervical lymphadenopathy Cardiovascular: Normal rate, regular rhythm and normal heart sounds.   No murmur heard. No carotid bruit .  No edema Pulmonary/Chest: Effort normal and breath sounds normal. No respiratory distress. No has no wheezes. No rales.  Skin: Skin is warm and dry. Not diaphoretic.  Psychiatric: Normal mood and affect. Behavior is normal.      Assessment & Plan:    See Problem List for Assessment and Plan of chronic medical problems.

## 2018-05-19 ENCOUNTER — Ambulatory Visit (INDEPENDENT_AMBULATORY_CARE_PROVIDER_SITE_OTHER): Payer: Medicare Other

## 2018-05-19 ENCOUNTER — Ambulatory Visit: Payer: Medicare Other | Admitting: Internal Medicine

## 2018-05-19 DIAGNOSIS — E291 Testicular hypofunction: Secondary | ICD-10-CM | POA: Diagnosis not present

## 2018-05-19 DIAGNOSIS — J4540 Moderate persistent asthma, uncomplicated: Secondary | ICD-10-CM

## 2018-05-19 MED ORDER — OMALIZUMAB 150 MG ~~LOC~~ SOLR: 300.0000 mg | Freq: Once | SUBCUTANEOUS | Status: DC

## 2018-05-19 NOTE — Progress Notes (Signed)
Documented by Arianna Haydon CMA based on hand-written Xolair documentation sheet completed by Tammy Scott CMA, who administered the medication.  

## 2018-05-20 DIAGNOSIS — D509 Iron deficiency anemia, unspecified: Secondary | ICD-10-CM | POA: Diagnosis not present

## 2018-05-20 DIAGNOSIS — E1129 Type 2 diabetes mellitus with other diabetic kidney complication: Secondary | ICD-10-CM | POA: Diagnosis not present

## 2018-05-20 DIAGNOSIS — D631 Anemia in chronic kidney disease: Secondary | ICD-10-CM | POA: Diagnosis not present

## 2018-05-20 DIAGNOSIS — N2581 Secondary hyperparathyroidism of renal origin: Secondary | ICD-10-CM | POA: Diagnosis not present

## 2018-05-20 DIAGNOSIS — N186 End stage renal disease: Secondary | ICD-10-CM | POA: Diagnosis not present

## 2018-05-22 DIAGNOSIS — N186 End stage renal disease: Secondary | ICD-10-CM | POA: Diagnosis not present

## 2018-05-24 NOTE — Progress Notes (Signed)
Subjective:    Patient ID: Shane [name], male    DOB: 13-Aug-1933, 84 y.o.   MRN: [id number]  HPI The patient is here for follow up.  CAD, chronic combined systolic and diastolic HF: .  Diabetes: He is controlling his sugars with diet. He is compliant with a diabetic diet. He is not exercising regularly. He monitors his sugars and they have been running XXX. He checks his feet daily and denies foot lesions. He is up-to-date with an ophthalmology examination.   Hyperlipidemia: He is taking his medication daily. He is compliant with a low fat/cholesterol diet. He is exercising regularly. He denies myalgias.   Insomnia, OSA:  He is taking ambien nightly.  The Ambien helps him go to sleep.  He gets about 6 hrs of sleep, but it is broken up.  He is not using a cpap - he is claustrophobic.    CKD:  He follows with nephrology.  He is dialysis three times a week.  He is doing well with dialysis.    Gout;  He is taking allopurinol daily.  He has not had any gout flare ups.    Medications and allergies reviewed with patient and updated if appropriate.  Patient Active Problem List   Diagnosis Date Noted  . Cellulitis of right foot 01/06/2018  . Fatigue 09/08/2017  . Hypokalemia 08/22/2017  . ICD (implantable cardioverter-defibrillator) discharge 08/22/2017  . Ventricular tachycardia ([place]) 08/22/2017  . Chronic gout of left knee 07/21/2017  . Diabetic neuropathy ([place]) 04/25/2017  . Gait abnormality 04/25/2017  . Numbness and tingling of both feet 03/04/2017  . Peripheral neuropathy 03/04/2017  . Carotid artery disease ([place]) 01/09/2017  . Chronic combined systolic and diastolic CHF (congestive heart failure) ([place]) 07/01/2016  . Restrictive lung disease 01/22/2016  . Severe obstructive sleep apnea 12/17/2015  . Essential hypertension 11/16/2015  . 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 LBBB (left bundle branch block) 10/10/2014  . Syncope 10/06/2014  . Pain in thumb joint with movement of right  hand 01/04/2014  . Sinusitis, chronic 07/16/2013  . Asthma, moderate persistent 07/08/2013  . Benign prostatic hyperplasia without urinary obstruction 12/18/2011  . CAD s/p coronary arthery bypass graft   . Chronic kidney disease (CKD), stage IV (severe) ([place])   . H/O Spinal stenosis   . DM (diabetes mellitus), type 2 with renal complications ([place]) 67/34/1937  . Benign hypertensive heart disease without heart failure 11/07/2010  . Allergic rhinitis 11/07/2010  . Osteoarthritis 11/07/2010  . Ischemic cardiomyopathy   . Hypercholesterolemia     Current Outpatient Medications on File Prior to Visit  Medication Sig Dispense Refill  . Albuterol Sulfate (PROAIR RESPICLICK) 902 (90 Base) MCG/ACT AEPB Inhale 1-2 puffs into the lungs every 6 (six) hours as needed. 1 each 5  . allopurinol (ZYLOPRIM) 100 MG tablet Take 1 tablet (100 mg total) by mouth daily. 30 tablet 6  . amiodarone (PACERONE) 200 MG tablet Take 1 tablet (200 mg total) by mouth daily. (Patient taking differently: Take 200 mg by mouth at bedtime. ) 90 tablet 3  . aspirin EC 81 MG tablet Take 81 mg by mouth daily.     Marland Kitchen atorvastatin (LIPITOR) 10 MG tablet Take 10 mg by mouth daily.    . calcitRIOL (ROCALTROL) 0.25 MCG capsule Take 1 capsule (0.25 mcg total) by mouth every Monday, Wednesday, and Friday with hemodialysis. 60 capsule 0  . cetirizine (ZYRTEC) 10 MG tablet Take 10 mg by mouth at bedtime as needed (seasonal allergies).     Marland Kitchen  dextromethorphan-guaiFENesin (MUCINEX DM) 30-600 MG 12hr tablet Take 1 tablet by mouth 2 (two) times daily as needed for cough. 20 tablet 0  . fluocinonide cream (LIDEX) 5.70 % Apply 1 application topically 2 (two) times daily as needed (for itchy rash).     Marland Kitchen lidocaine-prilocaine (EMLA) cream Apply 1 application topically daily as needed for painful procedure/intervention.  11  . multivitamin (RENA-VIT) TABS tablet Take 1 tablet by mouth at bedtime. 60 tablet 0  . polyethylene glycol (MIRALAX /  GLYCOLAX) packet Take 17 g by mouth daily as needed for mild constipation. 14 each 0  . tadalafil (CIALIS) 20 MG tablet Take 20 mg by mouth daily as needed for erectile dysfunction. Do not exceed 2 doses in 7 days    . testosterone cypionate (DEPOTESTOTERONE CYPIONATE) 100 MG/ML injection Inject 400 mg into the muscle every 21 ( twenty-one) days. For IM use only    . triamcinolone (NASACORT AQ) 55 MCG/ACT AERO nasal inhaler Place 2 sprays into the nose daily as needed (seasonal allergies).     . zolpidem (AMBIEN) 10 MG tablet TAKE 1/2 TABLET (5 MG) BY MOUTH AT BEDTIME, TAKE AN ADDITIONAL 1/2 TABLET (5 MG) IF NEEDED FOR SLEEP. 30 tablet 0   Current Facility-Administered Medications on File Prior to Visit  Medication Dose Route Frequency Provider Last Rate Last Dose  . omalizumab Arvid Right) injection 300 mg  300 mg Subcutaneous Q14 Days Juanito Doom, MD   300 mg at 05/19/18 1549    Past Medical History:  Diagnosis Date  . Abnormal nuclear cardiac imaging test Nov 2010   moderate area of infarct in the inferior wall with only minimal reversibility and EF of 28%  . AICD (automatic cardioverter/defibrillator) present   . Allergic rhinitis 01-08-13   Uses nebulizer for chronic sinus issues and Mucinex.  . Arthritis    osteoarthritis   . Asthma    Extrinic  . Blood transfusion    ? at time of bypass surgery   . BPH (benign prostatic hyperplasia)   . Cellulitis of arm, left    MSSA  . CHF (congestive heart failure) ([place])   . CKD (chronic kidney disease), stage III ([place])    "was told due to meds he takes"-no Renal workup done  . Colon polyps    adenomatous  . Coronary artery disease    remote CABG in 1985, cath in 2003 by Dr. Lia Foyer with no PCI, last nuclear in 2010 showing scar and EF of 28%.   . Diabetes mellitus   . Diabetic neuropathy ([place]) 04/25/2017  . Dyslipidemia   . Dyspnea   . Gait abnormality 04/25/2017  . Glaucoma 01-08-13   tx. eye drops  . Hemodialysis patient ([place])   .  HOH (hard of hearing)   . HTN (hypertension)   . Hypercholesterolemia   . Left ventricular dysfunction    28% per nuclear in 2010 and 35 to 40% per echo in 2010  . Myocardial infarction ([place])    1985  . Neuromuscular disorder ([place])    legs mild paralysis-able to walk"nerve damage"-legs- left leg brace   . Neuropathy   . Pneumonia    hx of several times years ago   . Spinal stenosis     Past Surgical History:  Procedure Laterality Date  . A/V FISTULAGRAM Right 10/17/2016   Procedure: A/V Fistulagram;  Surgeon: Conrad [place], MD;  Location: [place] [facility];  Service: Cardiovascular;  Laterality: Right;  . BACK SURGERY  hx of back surgery x 4   . BASCILIC VEIN TRANSPOSITION Right 09/09/2016   Procedure: BASCILIC VEIN TRANSPOSITION Right Arm;  Surgeon: Rosetta Posner, MD;  Location: [facility] OR;  Service: Vascular;  Laterality: Right;  . BI-VENTRICULAR IMPLANTABLE CARDIOVERTER DEFIBRILLATOR N/A 10/10/2014   Procedure: BI-VENTRICULAR IMPLANTABLE CARDIOVERTER DEFIBRILLATOR  (CRT-D);  Surgeon: [name], MD;  Location: [facility] CATH LAB;  Service: Cardiovascular;  Laterality: N/A;  . CARDIAC CATHETERIZATION  2003  . Cataract      recent cataract surgery 6'14  . COLONOSCOPY N/A 01/25/2013   Procedure: COLONOSCOPY;  Surgeon: Irene Shipper, MD;  Location: [facility];  Service: Endoscopy;  Laterality: N/A;  . CORONARY ARTERY BYPASS GRAFT  1985   x 5 vessels  . LUMBAR DISC SURGERY    . PERIPHERAL VASCULAR BALLOON ANGIOPLASTY Right 10/17/2016   Procedure: Peripheral Vascular Balloon Angioplasty;  Surgeon: Conrad [place], MD;  Location: [facility];  Service: Cardiovascular;  Laterality: Right;  ARM VEINOUS AND CENTRAL VEIN  . PR POLYSOM 6/>YRS SLEEP 4/> ADDL PARAM ATTND  12/07/2015  . PROSTATE SURGERY    . TOTAL KNEE ARTHROPLASTY  08/01/2011   Procedure: TOTAL KNEE ARTHROPLASTY;  Surgeon: Johnn Hai;  Location: [facility] ORS;  Service: Orthopedics;  Laterality: Right;    Social History    Socioeconomic History  . Marital status: Married    Spouse name: Not on file  . Number of children: 2  . Years of education: Not on file  . Highest education level: Not on file  Occupational History  . Occupation: Theme park manager  Social Needs  . Financial resource strain: Not on file  . Food insecurity:    Worry: Not on file    Inability: Not on file  . Transportation needs:    Medical: Not on file    Non-medical: Not on file  Tobacco Use  . Smoking status: Former Smoker    Packs/day: 1.00    Years: 5.00    Pack years: 5.00    Types: Cigarettes, Pipe, Cigars    Last attempt to quit: 08/05/1958    Years since quitting: 59.8  . Smokeless tobacco: Never Used  Substance and Sexual Activity  . Alcohol use: No  . Drug use: No  . Sexual activity: Not on file  Lifestyle  . Physical activity:    Days per week: Not on file    Minutes per session: Not on file  . Stress: Not on file  Relationships  . Social connections:    Talks on phone: Not on file    Gets together: Not on file    Attends religious service: Not on file    Active member of club or organization: Not on file    Attends meetings of clubs or organizations: Not on file    Relationship status: Not on file  Other Topics Concern  . Not on file  Social History Narrative   Originally from [place]. He has always lived in [place]. Prior travel to [place], [place], [place], [place] ,[place], & [place]. 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 Has adopted children. Has a dog currently. No bird exposure. No mold exposure. Enjoys reading & traveling.    Family History  Problem Relation Age of Onset  . Heart attack Father   . Heart disease Father   . Colon polyps Father   . Lung cancer Mother   . Diabetes Sister        x 2  . Heart  disease Brother        x 2  . Bone cancer Brother   . Lung disease Neg Hx     Review of Systems  Constitutional: Negative for chills and fever.  Respiratory: Negative for cough,  shortness of breath and wheezing.   Cardiovascular: Negative for chest pain, palpitations and leg swelling.  Neurological: Positive for headaches (with dialysis).       Objective:   Vitals:   05/26/18 1325  BP: (!) 150/68  Pulse: 64  Resp: 16  Temp: 97.8 F (36.6 C)  SpO2: 97%   BP Readings from Last 3 Encounters:  05/26/18 (!) 150/68  04/21/18 (!) 142/66  04/08/18 124/60   Wt Readings from Last 3 Encounters:  05/26/18 201 lb (91.2 kg)  04/21/18 199 lb (90.3 kg)  04/08/18 200 lb 9.6 oz (91 kg)   Body mass index is 28.84 kg/m.   Physical Exam    Constitutional: Appears well-developed and well-nourished. No distress.  HENT:  Head: Normocephalic and atraumatic.  Neck: Neck supple. No tracheal deviation present. No thyromegaly present.  No cervical lymphadenopathy Cardiovascular: Normal rate, regular rhythm and normal heart sounds.   No murmur heard. No carotid bruit .  Trace b/l LE edema Pulmonary/Chest: Effort normal and breath sounds normal. No respiratory distress. No has no wheezes. No rales.  Skin: Skin is warm and dry. Not diaphoretic.  Psychiatric: Normal mood and affect. Behavior is normal.      Assessment & Plan:    See Problem List for Assessment and Plan of chronic medical problems.

## 2018-05-25 DIAGNOSIS — E1129 Type 2 diabetes mellitus with other diabetic kidney complication: Secondary | ICD-10-CM | POA: Diagnosis not present

## 2018-05-25 DIAGNOSIS — N2581 Secondary hyperparathyroidism of renal origin: Secondary | ICD-10-CM | POA: Diagnosis not present

## 2018-05-25 DIAGNOSIS — D631 Anemia in chronic kidney disease: Secondary | ICD-10-CM | POA: Diagnosis not present

## 2018-05-25 DIAGNOSIS — D509 Iron deficiency anemia, unspecified: Secondary | ICD-10-CM | POA: Diagnosis not present

## 2018-05-25 DIAGNOSIS — N186 End stage renal disease: Secondary | ICD-10-CM | POA: Diagnosis not present

## 2018-05-26 ENCOUNTER — Ambulatory Visit (INDEPENDENT_AMBULATORY_CARE_PROVIDER_SITE_OTHER): Payer: Medicare Other | Admitting: Internal Medicine

## 2018-05-26 ENCOUNTER — Encounter: Payer: Self-pay | Admitting: Internal Medicine

## 2018-05-26 VITALS — BP 150/68 | HR 64 | Temp 97.8°F | Resp 16 | Ht 70.0 in | Wt 201.0 lb

## 2018-05-26 DIAGNOSIS — I5042 Chronic combined systolic (congestive) and diastolic (congestive) heart failure: Secondary | ICD-10-CM | POA: Diagnosis not present

## 2018-05-26 DIAGNOSIS — F5101 Primary insomnia: Secondary | ICD-10-CM | POA: Diagnosis not present

## 2018-05-26 DIAGNOSIS — M1A0620 Idiopathic chronic gout, left knee, without tophus (tophi): Secondary | ICD-10-CM | POA: Diagnosis not present

## 2018-05-26 DIAGNOSIS — G4733 Obstructive sleep apnea (adult) (pediatric): Secondary | ICD-10-CM | POA: Diagnosis not present

## 2018-05-26 DIAGNOSIS — I255 Ischemic cardiomyopathy: Secondary | ICD-10-CM | POA: Diagnosis not present

## 2018-05-26 DIAGNOSIS — N184 Chronic kidney disease, stage 4 (severe): Secondary | ICD-10-CM | POA: Diagnosis not present

## 2018-05-26 DIAGNOSIS — I25119 Atherosclerotic heart disease of native coronary artery with unspecified angina pectoris: Secondary | ICD-10-CM

## 2018-05-26 DIAGNOSIS — E1122 Type 2 diabetes mellitus with diabetic chronic kidney disease: Secondary | ICD-10-CM | POA: Diagnosis not present

## 2018-05-26 DIAGNOSIS — E7800 Pure hypercholesterolemia, unspecified: Secondary | ICD-10-CM

## 2018-05-26 LAB — POCT GLYCOSYLATED HEMOGLOBIN (HGB A1C): HEMOGLOBIN A1C: 5.2 % (ref 4.0–5.6)

## 2018-05-26 MED ORDER — ZOLPIDEM TARTRATE 10 MG PO TABS: ORAL_TABLET | ORAL | 5 refills | Status: DC

## 2018-05-26 NOTE — Assessment & Plan Note (Signed)
Does not use cpap - he is claustrophobic

## 2018-05-26 NOTE — Patient Instructions (Addendum)
  Your a1c was checked today.   Medications reviewed and updated.  Changes include :   none  Your prescription(s) have been submitted to your pharmacy. Please take as directed and contact our office if you believe you are having problem(s) with the medication(s).    Please followup in 6 months

## 2018-05-27 DIAGNOSIS — D509 Iron deficiency anemia, unspecified: Secondary | ICD-10-CM | POA: Diagnosis not present

## 2018-05-27 DIAGNOSIS — E1129 Type 2 diabetes mellitus with other diabetic kidney complication: Secondary | ICD-10-CM | POA: Diagnosis not present

## 2018-05-27 DIAGNOSIS — D631 Anemia in chronic kidney disease: Secondary | ICD-10-CM | POA: Diagnosis not present

## 2018-05-27 DIAGNOSIS — G4700 Insomnia, unspecified: Secondary | ICD-10-CM | POA: Insufficient documentation

## 2018-05-27 DIAGNOSIS — N186 End stage renal disease: Secondary | ICD-10-CM | POA: Diagnosis not present

## 2018-05-27 DIAGNOSIS — N2581 Secondary hyperparathyroidism of renal origin: Secondary | ICD-10-CM | POA: Diagnosis not present

## 2018-05-27 NOTE — Assessment & Plan Note (Signed)
Check a1c today -  Lab Results  Component Value Date   HGBA1C 5.2 05/26/2018    In normal range - controlled with lifestyle

## 2018-05-27 NOTE — Assessment & Plan Note (Signed)
Taking allopurinol daily No gout symptoms

## 2018-05-27 NOTE — Assessment & Plan Note (Signed)
euvolemic Following with cardio, nephrology

## 2018-05-27 NOTE — Assessment & Plan Note (Signed)
Taking ambien nightly Will continue

## 2018-05-27 NOTE — Assessment & Plan Note (Signed)
ON HD three times a week and tolerating it well

## 2018-05-27 NOTE — Assessment & Plan Note (Signed)
Continue daily statin Regular exercise and healthy diet encouraged  

## 2018-05-27 NOTE — Assessment & Plan Note (Signed)
No cp, palps, sob Following with cardio

## 2018-05-28 ENCOUNTER — Ambulatory Visit (INDEPENDENT_AMBULATORY_CARE_PROVIDER_SITE_OTHER): Payer: Medicare Other | Admitting: *Deleted

## 2018-05-28 DIAGNOSIS — I5022 Chronic systolic (congestive) heart failure: Secondary | ICD-10-CM

## 2018-05-28 DIAGNOSIS — I255 Ischemic cardiomyopathy: Secondary | ICD-10-CM

## 2018-05-28 NOTE — Progress Notes (Signed)
Remote ICD transmission.   

## 2018-05-29 DIAGNOSIS — N186 End stage renal disease: Secondary | ICD-10-CM | POA: Diagnosis not present

## 2018-05-29 DIAGNOSIS — N2581 Secondary hyperparathyroidism of renal origin: Secondary | ICD-10-CM | POA: Diagnosis not present

## 2018-05-29 DIAGNOSIS — D509 Iron deficiency anemia, unspecified: Secondary | ICD-10-CM | POA: Diagnosis not present

## 2018-05-29 DIAGNOSIS — D631 Anemia in chronic kidney disease: Secondary | ICD-10-CM | POA: Diagnosis not present

## 2018-05-29 DIAGNOSIS — E1129 Type 2 diabetes mellitus with other diabetic kidney complication: Secondary | ICD-10-CM | POA: Diagnosis not present

## 2018-06-01 ENCOUNTER — Telehealth: Payer: Self-pay | Admitting: Pulmonary Disease

## 2018-06-01 DIAGNOSIS — D509 Iron deficiency anemia, unspecified: Secondary | ICD-10-CM | POA: Diagnosis not present

## 2018-06-01 DIAGNOSIS — N2581 Secondary hyperparathyroidism of renal origin: Secondary | ICD-10-CM | POA: Diagnosis not present

## 2018-06-01 DIAGNOSIS — N186 End stage renal disease: Secondary | ICD-10-CM | POA: Diagnosis not present

## 2018-06-01 DIAGNOSIS — E1129 Type 2 diabetes mellitus with other diabetic kidney complication: Secondary | ICD-10-CM | POA: Diagnosis not present

## 2018-06-01 DIAGNOSIS — D631 Anemia in chronic kidney disease: Secondary | ICD-10-CM | POA: Diagnosis not present

## 2018-06-01 NOTE — Telephone Encounter (Signed)
Prefilled Syringes: # 150mg  1  #75mg  0 Arrival Date: 06/01/18 Lot #: 150mg  [id number]      75mg  0 Exp Date: 150mg  11/2018   75mg  0

## 2018-06-03 DIAGNOSIS — N186 End stage renal disease: Secondary | ICD-10-CM | POA: Diagnosis not present

## 2018-06-03 DIAGNOSIS — E1129 Type 2 diabetes mellitus with other diabetic kidney complication: Secondary | ICD-10-CM | POA: Diagnosis not present

## 2018-06-03 DIAGNOSIS — D509 Iron deficiency anemia, unspecified: Secondary | ICD-10-CM | POA: Diagnosis not present

## 2018-06-03 DIAGNOSIS — D631 Anemia in chronic kidney disease: Secondary | ICD-10-CM | POA: Diagnosis not present

## 2018-06-03 DIAGNOSIS — N2581 Secondary hyperparathyroidism of renal origin: Secondary | ICD-10-CM | POA: Diagnosis not present

## 2018-06-05 DIAGNOSIS — D631 Anemia in chronic kidney disease: Secondary | ICD-10-CM | POA: Diagnosis not present

## 2018-06-05 DIAGNOSIS — E1129 Type 2 diabetes mellitus with other diabetic kidney complication: Secondary | ICD-10-CM | POA: Diagnosis not present

## 2018-06-05 DIAGNOSIS — I129 Hypertensive chronic kidney disease with stage 1 through stage 4 chronic kidney disease, or unspecified chronic kidney disease: Secondary | ICD-10-CM | POA: Diagnosis not present

## 2018-06-05 DIAGNOSIS — N186 End stage renal disease: Secondary | ICD-10-CM | POA: Diagnosis not present

## 2018-06-05 DIAGNOSIS — Z23 Encounter for immunization: Secondary | ICD-10-CM | POA: Diagnosis not present

## 2018-06-05 DIAGNOSIS — Z992 Dependence on renal dialysis: Secondary | ICD-10-CM | POA: Diagnosis not present

## 2018-06-05 DIAGNOSIS — N2581 Secondary hyperparathyroidism of renal origin: Secondary | ICD-10-CM | POA: Diagnosis not present

## 2018-06-05 DIAGNOSIS — D509 Iron deficiency anemia, unspecified: Secondary | ICD-10-CM | POA: Diagnosis not present

## 2018-06-08 DIAGNOSIS — Z23 Encounter for immunization: Secondary | ICD-10-CM | POA: Diagnosis not present

## 2018-06-08 DIAGNOSIS — E1129 Type 2 diabetes mellitus with other diabetic kidney complication: Secondary | ICD-10-CM | POA: Diagnosis not present

## 2018-06-08 DIAGNOSIS — D509 Iron deficiency anemia, unspecified: Secondary | ICD-10-CM | POA: Diagnosis not present

## 2018-06-08 DIAGNOSIS — N186 End stage renal disease: Secondary | ICD-10-CM | POA: Diagnosis not present

## 2018-06-08 DIAGNOSIS — N2581 Secondary hyperparathyroidism of renal origin: Secondary | ICD-10-CM | POA: Diagnosis not present

## 2018-06-08 DIAGNOSIS — D631 Anemia in chronic kidney disease: Secondary | ICD-10-CM | POA: Diagnosis not present

## 2018-06-09 ENCOUNTER — Ambulatory Visit (INDEPENDENT_AMBULATORY_CARE_PROVIDER_SITE_OTHER): Payer: Medicare Other

## 2018-06-09 DIAGNOSIS — J4540 Moderate persistent asthma, uncomplicated: Secondary | ICD-10-CM | POA: Diagnosis not present

## 2018-06-09 DIAGNOSIS — E291 Testicular hypofunction: Secondary | ICD-10-CM | POA: Diagnosis not present

## 2018-06-09 NOTE — Progress Notes (Signed)
Documented by Jessica Jones CMA based on hand-written Xolair documentation sheet completed by Tammy Scott CMA, who administered the medication.  

## 2018-06-10 DIAGNOSIS — N186 End stage renal disease: Secondary | ICD-10-CM | POA: Diagnosis not present

## 2018-06-10 DIAGNOSIS — D509 Iron deficiency anemia, unspecified: Secondary | ICD-10-CM | POA: Diagnosis not present

## 2018-06-10 DIAGNOSIS — N2581 Secondary hyperparathyroidism of renal origin: Secondary | ICD-10-CM | POA: Diagnosis not present

## 2018-06-10 DIAGNOSIS — Z23 Encounter for immunization: Secondary | ICD-10-CM | POA: Diagnosis not present

## 2018-06-10 DIAGNOSIS — D631 Anemia in chronic kidney disease: Secondary | ICD-10-CM | POA: Diagnosis not present

## 2018-06-10 DIAGNOSIS — E1129 Type 2 diabetes mellitus with other diabetic kidney complication: Secondary | ICD-10-CM | POA: Diagnosis not present

## 2018-06-12 DIAGNOSIS — D509 Iron deficiency anemia, unspecified: Secondary | ICD-10-CM | POA: Diagnosis not present

## 2018-06-12 DIAGNOSIS — D631 Anemia in chronic kidney disease: Secondary | ICD-10-CM | POA: Diagnosis not present

## 2018-06-12 DIAGNOSIS — N186 End stage renal disease: Secondary | ICD-10-CM | POA: Diagnosis not present

## 2018-06-12 DIAGNOSIS — E1129 Type 2 diabetes mellitus with other diabetic kidney complication: Secondary | ICD-10-CM | POA: Diagnosis not present

## 2018-06-12 DIAGNOSIS — N2581 Secondary hyperparathyroidism of renal origin: Secondary | ICD-10-CM | POA: Diagnosis not present

## 2018-06-12 DIAGNOSIS — Z23 Encounter for immunization: Secondary | ICD-10-CM | POA: Diagnosis not present

## 2018-06-15 DIAGNOSIS — D509 Iron deficiency anemia, unspecified: Secondary | ICD-10-CM | POA: Diagnosis not present

## 2018-06-15 DIAGNOSIS — E1129 Type 2 diabetes mellitus with other diabetic kidney complication: Secondary | ICD-10-CM | POA: Diagnosis not present

## 2018-06-15 DIAGNOSIS — N2581 Secondary hyperparathyroidism of renal origin: Secondary | ICD-10-CM | POA: Diagnosis not present

## 2018-06-15 DIAGNOSIS — D631 Anemia in chronic kidney disease: Secondary | ICD-10-CM | POA: Diagnosis not present

## 2018-06-15 DIAGNOSIS — Z23 Encounter for immunization: Secondary | ICD-10-CM | POA: Diagnosis not present

## 2018-06-15 DIAGNOSIS — N186 End stage renal disease: Secondary | ICD-10-CM | POA: Diagnosis not present

## 2018-06-17 DIAGNOSIS — N186 End stage renal disease: Secondary | ICD-10-CM | POA: Diagnosis not present

## 2018-06-17 DIAGNOSIS — D509 Iron deficiency anemia, unspecified: Secondary | ICD-10-CM | POA: Diagnosis not present

## 2018-06-17 DIAGNOSIS — E1129 Type 2 diabetes mellitus with other diabetic kidney complication: Secondary | ICD-10-CM | POA: Diagnosis not present

## 2018-06-17 DIAGNOSIS — Z23 Encounter for immunization: Secondary | ICD-10-CM | POA: Diagnosis not present

## 2018-06-17 DIAGNOSIS — N2581 Secondary hyperparathyroidism of renal origin: Secondary | ICD-10-CM | POA: Diagnosis not present

## 2018-06-17 DIAGNOSIS — D631 Anemia in chronic kidney disease: Secondary | ICD-10-CM | POA: Diagnosis not present

## 2018-06-18 ENCOUNTER — Ambulatory Visit: Payer: Private Health Insurance - Indemnity | Admitting: Podiatry

## 2018-06-18 ENCOUNTER — Ambulatory Visit: Payer: Medicare Other | Admitting: Podiatry

## 2018-06-19 DIAGNOSIS — Z23 Encounter for immunization: Secondary | ICD-10-CM | POA: Diagnosis not present

## 2018-06-19 DIAGNOSIS — D509 Iron deficiency anemia, unspecified: Secondary | ICD-10-CM | POA: Diagnosis not present

## 2018-06-19 DIAGNOSIS — N186 End stage renal disease: Secondary | ICD-10-CM | POA: Diagnosis not present

## 2018-06-19 DIAGNOSIS — E1129 Type 2 diabetes mellitus with other diabetic kidney complication: Secondary | ICD-10-CM | POA: Diagnosis not present

## 2018-06-19 DIAGNOSIS — D631 Anemia in chronic kidney disease: Secondary | ICD-10-CM | POA: Diagnosis not present

## 2018-06-19 DIAGNOSIS — N2581 Secondary hyperparathyroidism of renal origin: Secondary | ICD-10-CM | POA: Diagnosis not present

## 2018-06-19 LAB — CUP PACEART REMOTE DEVICE CHECK
Battery Remaining Longevity: 31 mo
Battery Voltage: 2.92 V
Brady Statistic AP VP Percent: 0.31 %
Brady Statistic AS VS Percent: 2.13 %
Brady Statistic RA Percent Paced: 0.33 %
Brady Statistic RV Percent Paced: 4.35 %
Date Time Interrogation Session: 20191024041804
HIGH POWER IMPEDANCE MEASURED VALUE: 76 Ohm
Implantable Lead Location: 753859
Implantable Lead Location: 753860
Implantable Lead Model: 4598
Implantable Lead Model: 5076
Lead Channel Impedance Value: 380 Ohm
Lead Channel Impedance Value: 399 Ohm
Lead Channel Impedance Value: 456 Ohm
Lead Channel Impedance Value: 608 Ohm
Lead Channel Impedance Value: 760 Ohm
Lead Channel Impedance Value: 779 Ohm
Lead Channel Pacing Threshold Amplitude: 0.75 V
Lead Channel Pacing Threshold Amplitude: 1.125 V
Lead Channel Pacing Threshold Amplitude: 2.5 V
Lead Channel Pacing Threshold Pulse Width: 0.4 ms
Lead Channel Sensing Intrinsic Amplitude: 2.125 mV
Lead Channel Setting Pacing Amplitude: 2 V
Lead Channel Setting Pacing Amplitude: 4 V
Lead Channel Setting Pacing Pulse Width: 0.8 ms
Lead Channel Setting Sensing Sensitivity: 0.3 mV
MDC IDC LEAD IMPLANT DT: 20160308
MDC IDC LEAD IMPLANT DT: 20160308
MDC IDC LEAD IMPLANT DT: 20160308
MDC IDC LEAD LOCATION: 753858
MDC IDC MSMT LEADCHNL LV IMPEDANCE VALUE: 323 Ohm
MDC IDC MSMT LEADCHNL LV IMPEDANCE VALUE: 380 Ohm
MDC IDC MSMT LEADCHNL LV IMPEDANCE VALUE: 456 Ohm
MDC IDC MSMT LEADCHNL LV IMPEDANCE VALUE: 494 Ohm
MDC IDC MSMT LEADCHNL LV IMPEDANCE VALUE: 608 Ohm
MDC IDC MSMT LEADCHNL LV IMPEDANCE VALUE: 703 Ohm
MDC IDC MSMT LEADCHNL LV PACING THRESHOLD PULSEWIDTH: 0.6 ms
MDC IDC MSMT LEADCHNL RA IMPEDANCE VALUE: 399 Ohm
MDC IDC MSMT LEADCHNL RA SENSING INTR AMPL: 2.125 mV
MDC IDC MSMT LEADCHNL RV PACING THRESHOLD PULSEWIDTH: 0.4 ms
MDC IDC MSMT LEADCHNL RV SENSING INTR AMPL: 7.875 mV
MDC IDC MSMT LEADCHNL RV SENSING INTR AMPL: 7.875 mV
MDC IDC PG IMPLANT DT: 20160308
MDC IDC SET LEADCHNL LV PACING AMPLITUDE: 2.25 V
MDC IDC SET LEADCHNL LV PACING PULSEWIDTH: 0.6 ms
MDC IDC STAT BRADY AP VS PERCENT: 0.02 %
MDC IDC STAT BRADY AS VP PERCENT: 97.54 %

## 2018-06-20 ENCOUNTER — Telehealth: Payer: Self-pay | Admitting: Internal Medicine

## 2018-06-20 NOTE — Telephone Encounter (Signed)
Patient is requesting pneumonia vac.  Please advise.  

## 2018-06-22 DIAGNOSIS — D631 Anemia in chronic kidney disease: Secondary | ICD-10-CM | POA: Diagnosis not present

## 2018-06-22 DIAGNOSIS — D509 Iron deficiency anemia, unspecified: Secondary | ICD-10-CM | POA: Diagnosis not present

## 2018-06-22 DIAGNOSIS — Z23 Encounter for immunization: Secondary | ICD-10-CM | POA: Diagnosis not present

## 2018-06-22 DIAGNOSIS — N186 End stage renal disease: Secondary | ICD-10-CM | POA: Diagnosis not present

## 2018-06-22 DIAGNOSIS — E1129 Type 2 diabetes mellitus with other diabetic kidney complication: Secondary | ICD-10-CM | POA: Diagnosis not present

## 2018-06-22 DIAGNOSIS — N2581 Secondary hyperparathyroidism of renal origin: Secondary | ICD-10-CM | POA: Diagnosis not present

## 2018-06-22 NOTE — Telephone Encounter (Signed)
SPoke with pt, advised he could get Pneumo 23 at any time. Will call back and schedule nurse visit.

## 2018-06-24 DIAGNOSIS — E1129 Type 2 diabetes mellitus with other diabetic kidney complication: Secondary | ICD-10-CM | POA: Diagnosis not present

## 2018-06-24 DIAGNOSIS — N2581 Secondary hyperparathyroidism of renal origin: Secondary | ICD-10-CM | POA: Diagnosis not present

## 2018-06-24 DIAGNOSIS — D509 Iron deficiency anemia, unspecified: Secondary | ICD-10-CM | POA: Diagnosis not present

## 2018-06-24 DIAGNOSIS — N186 End stage renal disease: Secondary | ICD-10-CM | POA: Diagnosis not present

## 2018-06-24 DIAGNOSIS — D631 Anemia in chronic kidney disease: Secondary | ICD-10-CM | POA: Diagnosis not present

## 2018-06-24 DIAGNOSIS — Z23 Encounter for immunization: Secondary | ICD-10-CM | POA: Diagnosis not present

## 2018-06-26 DIAGNOSIS — N2581 Secondary hyperparathyroidism of renal origin: Secondary | ICD-10-CM | POA: Diagnosis not present

## 2018-06-26 DIAGNOSIS — D509 Iron deficiency anemia, unspecified: Secondary | ICD-10-CM | POA: Diagnosis not present

## 2018-06-26 DIAGNOSIS — E1129 Type 2 diabetes mellitus with other diabetic kidney complication: Secondary | ICD-10-CM | POA: Diagnosis not present

## 2018-06-26 DIAGNOSIS — Z23 Encounter for immunization: Secondary | ICD-10-CM | POA: Diagnosis not present

## 2018-06-26 DIAGNOSIS — N186 End stage renal disease: Secondary | ICD-10-CM | POA: Diagnosis not present

## 2018-06-26 DIAGNOSIS — D631 Anemia in chronic kidney disease: Secondary | ICD-10-CM | POA: Diagnosis not present

## 2018-06-28 DIAGNOSIS — D509 Iron deficiency anemia, unspecified: Secondary | ICD-10-CM | POA: Diagnosis not present

## 2018-06-28 DIAGNOSIS — N186 End stage renal disease: Secondary | ICD-10-CM | POA: Diagnosis not present

## 2018-06-28 DIAGNOSIS — E1129 Type 2 diabetes mellitus with other diabetic kidney complication: Secondary | ICD-10-CM | POA: Diagnosis not present

## 2018-06-28 DIAGNOSIS — N2581 Secondary hyperparathyroidism of renal origin: Secondary | ICD-10-CM | POA: Diagnosis not present

## 2018-06-28 DIAGNOSIS — D631 Anemia in chronic kidney disease: Secondary | ICD-10-CM | POA: Diagnosis not present

## 2018-06-28 DIAGNOSIS — Z23 Encounter for immunization: Secondary | ICD-10-CM | POA: Diagnosis not present

## 2018-06-29 ENCOUNTER — Telehealth: Payer: Self-pay | Admitting: *Deleted

## 2018-06-29 NOTE — Telephone Encounter (Signed)
Prefilled Syringes: # 150mg  4  #75mg  0 Arrival Date: 06/29/18 Lot #: 150mg  [id number]      75mg  0 Exp Date: 150mg  12/2018   75mg  0 There was a mix up, on the address and the acct. In general. Meds came in today.

## 2018-06-30 ENCOUNTER — Ambulatory Visit: Payer: Medicare Other

## 2018-06-30 DIAGNOSIS — Z23 Encounter for immunization: Secondary | ICD-10-CM | POA: Diagnosis not present

## 2018-06-30 DIAGNOSIS — D509 Iron deficiency anemia, unspecified: Secondary | ICD-10-CM | POA: Diagnosis not present

## 2018-06-30 DIAGNOSIS — N186 End stage renal disease: Secondary | ICD-10-CM | POA: Diagnosis not present

## 2018-06-30 DIAGNOSIS — E1129 Type 2 diabetes mellitus with other diabetic kidney complication: Secondary | ICD-10-CM | POA: Diagnosis not present

## 2018-06-30 DIAGNOSIS — D631 Anemia in chronic kidney disease: Secondary | ICD-10-CM | POA: Diagnosis not present

## 2018-06-30 DIAGNOSIS — N2581 Secondary hyperparathyroidism of renal origin: Secondary | ICD-10-CM | POA: Diagnosis not present

## 2018-07-01 ENCOUNTER — Ambulatory Visit (INDEPENDENT_AMBULATORY_CARE_PROVIDER_SITE_OTHER): Payer: Medicare Other

## 2018-07-01 DIAGNOSIS — J4540 Moderate persistent asthma, uncomplicated: Secondary | ICD-10-CM | POA: Diagnosis not present

## 2018-07-01 MED ORDER — OMALIZUMAB 150 MG ~~LOC~~ SOLR
300.0000 mg | SUBCUTANEOUS | Status: DC
  Administered 2018-07-01: 300 mg via SUBCUTANEOUS

## 2018-07-01 NOTE — Progress Notes (Signed)
Documentation of medication administration and charges of Xolair have been completed by Jade Burkard, CMA based on the hand written Xolair documentation sheet completed by Tammy Scott, who administered the medication.  

## 2018-07-03 DIAGNOSIS — D631 Anemia in chronic kidney disease: Secondary | ICD-10-CM | POA: Diagnosis not present

## 2018-07-03 DIAGNOSIS — N186 End stage renal disease: Secondary | ICD-10-CM | POA: Diagnosis not present

## 2018-07-03 DIAGNOSIS — Z23 Encounter for immunization: Secondary | ICD-10-CM | POA: Diagnosis not present

## 2018-07-03 DIAGNOSIS — N2581 Secondary hyperparathyroidism of renal origin: Secondary | ICD-10-CM | POA: Diagnosis not present

## 2018-07-03 DIAGNOSIS — E1129 Type 2 diabetes mellitus with other diabetic kidney complication: Secondary | ICD-10-CM | POA: Diagnosis not present

## 2018-07-03 DIAGNOSIS — D509 Iron deficiency anemia, unspecified: Secondary | ICD-10-CM | POA: Diagnosis not present

## 2018-07-05 DIAGNOSIS — Z992 Dependence on renal dialysis: Secondary | ICD-10-CM | POA: Diagnosis not present

## 2018-07-05 DIAGNOSIS — I129 Hypertensive chronic kidney disease with stage 1 through stage 4 chronic kidney disease, or unspecified chronic kidney disease: Secondary | ICD-10-CM | POA: Diagnosis not present

## 2018-07-05 DIAGNOSIS — N186 End stage renal disease: Secondary | ICD-10-CM | POA: Diagnosis not present

## 2018-07-06 DIAGNOSIS — E1129 Type 2 diabetes mellitus with other diabetic kidney complication: Secondary | ICD-10-CM | POA: Diagnosis not present

## 2018-07-06 DIAGNOSIS — D631 Anemia in chronic kidney disease: Secondary | ICD-10-CM | POA: Diagnosis not present

## 2018-07-06 DIAGNOSIS — N186 End stage renal disease: Secondary | ICD-10-CM | POA: Diagnosis not present

## 2018-07-06 DIAGNOSIS — N2581 Secondary hyperparathyroidism of renal origin: Secondary | ICD-10-CM | POA: Diagnosis not present

## 2018-07-06 DIAGNOSIS — D509 Iron deficiency anemia, unspecified: Secondary | ICD-10-CM | POA: Diagnosis not present

## 2018-07-06 DIAGNOSIS — E8779 Other fluid overload: Secondary | ICD-10-CM | POA: Diagnosis not present

## 2018-07-07 ENCOUNTER — Ambulatory Visit (INDEPENDENT_AMBULATORY_CARE_PROVIDER_SITE_OTHER): Payer: Medicare Other | Admitting: Podiatry

## 2018-07-07 DIAGNOSIS — B351 Tinea unguium: Secondary | ICD-10-CM | POA: Diagnosis not present

## 2018-07-07 DIAGNOSIS — E1151 Type 2 diabetes mellitus with diabetic peripheral angiopathy without gangrene: Secondary | ICD-10-CM

## 2018-07-07 DIAGNOSIS — M79675 Pain in left toe(s): Principal | ICD-10-CM

## 2018-07-07 DIAGNOSIS — M79676 Pain in unspecified toe(s): Secondary | ICD-10-CM

## 2018-07-07 DIAGNOSIS — E1142 Type 2 diabetes mellitus with diabetic polyneuropathy: Secondary | ICD-10-CM

## 2018-07-07 NOTE — Patient Instructions (Signed)

## 2018-07-08 ENCOUNTER — Encounter: Payer: Self-pay | Admitting: Podiatry

## 2018-07-08 ENCOUNTER — Encounter: Payer: Self-pay | Admitting: Pulmonary Disease

## 2018-07-08 ENCOUNTER — Ambulatory Visit (INDEPENDENT_AMBULATORY_CARE_PROVIDER_SITE_OTHER): Payer: Medicare Other | Admitting: Pulmonary Disease

## 2018-07-08 VITALS — BP 136/62 | HR 87 | Ht 70.0 in | Wt 204.0 lb

## 2018-07-08 DIAGNOSIS — D631 Anemia in chronic kidney disease: Secondary | ICD-10-CM | POA: Diagnosis not present

## 2018-07-08 DIAGNOSIS — N186 End stage renal disease: Secondary | ICD-10-CM | POA: Diagnosis not present

## 2018-07-08 DIAGNOSIS — J4541 Moderate persistent asthma with (acute) exacerbation: Secondary | ICD-10-CM | POA: Diagnosis not present

## 2018-07-08 DIAGNOSIS — I255 Ischemic cardiomyopathy: Secondary | ICD-10-CM

## 2018-07-08 DIAGNOSIS — E8779 Other fluid overload: Secondary | ICD-10-CM | POA: Diagnosis not present

## 2018-07-08 DIAGNOSIS — D509 Iron deficiency anemia, unspecified: Secondary | ICD-10-CM | POA: Diagnosis not present

## 2018-07-08 DIAGNOSIS — E1129 Type 2 diabetes mellitus with other diabetic kidney complication: Secondary | ICD-10-CM | POA: Diagnosis not present

## 2018-07-08 DIAGNOSIS — N2581 Secondary hyperparathyroidism of renal origin: Secondary | ICD-10-CM | POA: Diagnosis not present

## 2018-07-08 MED ORDER — FLUTICASONE FUROATE-VILANTEROL 200-25 MCG/INH IN AEPB: 1.0000 | INHALATION_SPRAY | Freq: Every day | RESPIRATORY_TRACT | 0 refills | Status: DC

## 2018-07-08 MED ORDER — PREDNISONE 20 MG PO TABS: 20.0000 mg | ORAL_TABLET | Freq: Every day | ORAL | 0 refills | Status: DC

## 2018-07-08 NOTE — Progress Notes (Signed)
Synopsis: Former patient of Dr. Ashok [name] with Asthma on Xolair, allergic rhinitis and systolic heart failure.   Subjective:   PATIENT ID: Shane [name] GENDER: male DOB: 09/08/1933, MRN: [id number]   HPI  Chief Complaint  Patient presents with  . Follow-up    pt doing very well on Xolair, calls it a "miracle drug".  denies any asthma s/s.  does note some SOB on exertion.      He has a "pinched nerve in his back" and hasn't been walking much this week. He has a little more sinus congestion this week, prior that he was well.  No recent fever or chills.  No dyspnea.  He has not had to use albuterol since the last visit.  He has some at home.  His flu shot is up to date.      Past Medical History:  Diagnosis Date  . Abnormal nuclear cardiac imaging test Nov 2010   moderate area of infarct in the inferior wall with only minimal reversibility and EF of 28%  . AICD (automatic cardioverter/defibrillator) present   . Allergic rhinitis 01-08-13   Uses nebulizer for chronic sinus issues and Mucinex.  . Arthritis    osteoarthritis   . Asthma    Extrinic  . Blood transfusion    ? at time of bypass surgery   . BPH (benign prostatic hyperplasia)   . Cellulitis of arm, left    MSSA  . CHF (congestive heart failure) ([place])   . CKD (chronic kidney disease), stage III ([place])    "was told due to meds he takes"-no Renal workup done  . Colon polyps    adenomatous  . Coronary artery disease    remote CABG in 1985, cath in 2003 by Dr. Lia [name] with no PCI, last nuclear in 2010 showing scar and EF of 28%.   . Diabetes mellitus   . Diabetic neuropathy ([place]) 04/25/2017  . Dyslipidemia   . Dyspnea   . Gait abnormality 04/25/2017  . Glaucoma 01-08-13   tx. eye drops  . Hemodialysis patient ([place])   . HOH (hard of hearing)   . HTN (hypertension)   . Hypercholesterolemia   . Left ventricular dysfunction    28% per nuclear in 2010 and 35 to 40% per echo in 2010  . Myocardial infarction ([place])    1985  . Neuromuscular disorder ([place])    legs mild paralysis-able to walk"nerve damage"-legs- left leg brace   . Neuropathy   . Pneumonia    hx of several times years ago   . Spinal stenosis       Review of Systems  Constitutional: Negative for chills, malaise/fatigue and weight loss.  HENT: Positive for congestion. Negative for ear discharge, nosebleeds and sinus pain.   Respiratory: Positive for cough and sputum production. Negative for wheezing.   Cardiovascular: Negative for orthopnea, claudication and leg swelling.      Objective:  Physical Exam   Vitals:   07/08/18 1430  BP: 136/62  Pulse: 87  SpO2: 96%  Weight: 204 lb (92.5 kg)  Height: 5\' 10"  (1.778 m)    Gen: well appearing HENT: OP clear, TM's clear, neck supple PULM: Wheezing R base B, normal percussion CV: RRR, no mgr, trace edema GI: BS+, soft, nontender Derm: no cyanosis or rash Psyche: normal mood and affect   CBC    Component Value Date/Time   WBC 8.2 12/19/2017 0830   RBC 3.08 (L) 12/19/2017 0830   HGB 8.8 (L) 12/19/2017 0830  HGB 11.5 (L) 10/27/2017 1222   HCT 28.5 (L) 12/19/2017 0830   HCT 36.4 (L) 10/27/2017 1222   PLT 123 (L) 12/19/2017 0830   PLT 100 (LL) 10/27/2017 1222   MCV 92.5 12/19/2017 0830   MCV 95 10/27/2017 1222   MCH 28.6 12/19/2017 0830   MCHC 30.9 12/19/2017 0830   RDW 14.5 12/19/2017 0830   RDW 15.6 (H) 10/27/2017 1222   LYMPHSABS 1.2 12/16/2017 0133   LYMPHSABS 0.6 (L) 10/27/2017 1222   MONOABS 1.1 12/16/2017 0133   EOSABS 0.1 12/16/2017 0133   EOSABS 0.0 10/27/2017 1222   BASOSABS 0.0 12/16/2017 0133   BASOSABS 0.0 10/27/2017 1222     PFT 01/22/16: FVC 2.97 L (72%) FEV1 2.23 L (76%) FEV1/FVC 0.75 FEF 25-75 1.16 L (81%) no bronchodilator response TLC 5.56 L (77%) RV 91% ERV 63% DLCO corrected 57% (hemoglobin 15.1) 08/30/13: FVC 3.84 L (91%) FEV1 2.85 L (94%) FEV1/FVC 0.74 FEF 25-75 2.10 L (98%) no bronchodilator response TLC 5.94 L (83%)                    ERV  84% DLCO uncorrected 64%  IMAGING CXR PA/LAT 10/11/14: No opacity or effusion appreciated. Heart normal in size. Pacemaker with leads noted. Low lung volumes.  V/Q 10/06/14 : Heterogeneous ventilation without focal defect. No evidence of pulmonary embolism.  CARDIAC TTE (10/06/14): LV mildly dilated. EF 35-40%. Regional wall motion abnormalities noted. LA moderately dilated & RA normal in size. RV normal in size and function. Pulmonary artery systolic pressure 47 mmHg. No aortic regurgitation or stenosis. Moderate mitral regurgitation without stenosis. No pulmonic regurgitation. Mild tricuspid regurgitation. No pericardial effusion.  LABS 07/07/13 IgE: 209.1 RAST Panel: [place] 0.28 (class 0/1)    Assessment & Plan:   Moderate persistent asthmatic bronchitis with acute exacerbation   Discussion: Shane [name] has a mild exacerbation of his asthma and that he is wheezing in the right base after a recent upper respiratory infection.  Today we talked about the fact that this could have been prevented with an inhaled corticosteroid.  Interestingly he tells me that he has never been on this type of medicine, from my review of his medical record I think he is actually right.  Plan: Mild exacerbation of asthma: Take prednisone 20 mg daily for 5 days Call me if no improvement by Friday so we can call in an antibiotic Take Breo 1 puff daily until you run out of the sample Next fall I think we could prevent an exacerbation like this by having you take an inhaled corticosteroid Continue Xolair every three weeks Keep using albuterol as needed for chest tightness wheezing or shortness of breath Unfortunately we do not have stores of the Pneumovax today, we can give this to you on the next visit or you can have it sooner at a pharmacy or your primary care physician's office Stay active  We will see you back in 6 months or sooner if needed    Current Outpatient Medications:  .  Albuterol Sulfate (PROAIR  RESPICLICK) 732 (90 Base) MCG/ACT AEPB, Inhale 1-2 puffs into the lungs every 6 (six) hours as needed., Disp: 1 each, Rfl: 5 .  allopurinol (ZYLOPRIM) 100 MG tablet, Take 1 tablet (100 mg total) by mouth daily., Disp: 30 tablet, Rfl: 6 .  amiodarone (PACERONE) 200 MG tablet, Take 1 tablet (200 mg total) by mouth daily. (Patient taking differently: Take 200 mg by mouth at bedtime. ), Disp: 90 tablet, Rfl: 3 .  amLODipine (NORVASC) 5 MG tablet, amlodipine 5 mg tablet, Disp: , Rfl:  .  aspirin EC 81 MG tablet, Take 81 mg by mouth daily. , Disp: , Rfl:  .  atorvastatin (LIPITOR) 10 MG tablet, Take 10 mg by mouth daily., Disp: , Rfl:  .  B Complex-C-Folic Acid (RENAL-VITE) 0.8 MG TABS, Rena-Vite 0.8 mg tablet, Disp: , Rfl:  .  calcitRIOL (ROCALTROL) 0.25 MCG capsule, Take 1 capsule (0.25 mcg total) by mouth every Monday, Wednesday, and Friday with hemodialysis., Disp: 60 capsule, Rfl: 0 .  cephALEXin (KEFLEX) 500 MG capsule, cephalexin 500 mg capsule, Disp: , Rfl:  .  cetirizine (ZYRTEC) 10 MG tablet, Take 10 mg by mouth at bedtime as needed (seasonal allergies). , Disp: , Rfl:  .  dextromethorphan-guaiFENesin (MUCINEX DM) 30-600 MG 12hr tablet, Take 1 tablet by mouth 2 (two) times daily as needed for cough., Disp: 20 tablet, Rfl: 0 .  dicyclomine (BENTYL) 10 MG capsule, TAKE 1 CAPSULE BY MOUTH THREE TIMES DAILY AS NEEDED USE SPARINGLY, Disp: , Rfl: 3 .  diphenhydrAMINE (BENADRYL) 12.5 MG/5ML elixir, Take by mouth., Disp: , Rfl:  .  doxycycline (VIBRA-TABS) 100 MG tablet, doxycycline hyclate 100 mg tablet, Disp: , Rfl:  .  fluocinonide cream (LIDEX) 3.55 %, Apply 1 application topically 2 (two) times daily as needed (for itchy rash). , Disp: , Rfl:  .  glipiZIDE (GLUCOTROL XL) 2.5 MG 24 hr tablet, Take 2.5 mg by mouth daily., Disp: , Rfl: 6 .  hydrALAZINE (APRESOLINE) 50 MG tablet, hydralazine 50 mg tablet, Disp: , Rfl:  .  lidocaine-prilocaine (EMLA) cream, Apply 1 application topically daily as needed  for painful procedure/intervention., Disp: , Rfl: 11 .  lidocaine-prilocaine (EMLA) cream, lidocaine-prilocaine 2.5 %-2.5 % topical cream, Disp: , Rfl:  .  multivitamin (RENA-VIT) TABS tablet, Take 1 tablet by mouth at bedtime., Disp: 60 tablet, Rfl: 0 .  polyethylene glycol (MIRALAX / GLYCOLAX) packet, Take 17 g by mouth daily as needed for mild constipation., Disp: 14 each, Rfl: 0 .  Polyethylene Glycol 3350 GRAN, polyethylene glycol 3350 17 gram oral powder packet, Disp: , Rfl:  .  potassium chloride SA (KLOR-CON M20) 20 MEQ tablet, Klor-Con M20 mEq tablet,extended release, Disp: , Rfl:  .  predniSONE (DELTASONE) 10 MG tablet, prednisone 10 mg tablet, Disp: , Rfl:  .  Syringe/Needle, Disp, (SYRINGE 3CC/22GX1") 22G X 1" 3 ML MISC, Use to give injection, Disp: , Rfl:  .  tadalafil (CIALIS) 20 MG tablet, Take 20 mg by mouth daily as needed for erectile dysfunction. Do not exceed 2 doses in 7 days, Disp: , Rfl:  .  testosterone cypionate (DEPOTESTOTERONE CYPIONATE) 100 MG/ML injection, Inject 400 mg into the muscle every 21 ( twenty-one) days. For IM use only, Disp: , Rfl:  .  Testosterone Cypionate 100 MG/ML SOLN, Inject into the muscle., Disp: , Rfl:  .  Testosterone Cypionate 100 MG/ML SOLN, Inject into the muscle., Disp: , Rfl:  .  torsemide (DEMADEX) 20 MG tablet, torsemide 20 mg tablet, Disp: , Rfl:  .  triamcinolone (NASACORT AQ) 55 MCG/ACT AERO nasal inhaler, Place 2 sprays into the nose daily as needed (seasonal allergies). , Disp: , Rfl:  .  zolpidem (AMBIEN) 10 MG tablet, TAKE 1/2 TABLET (5 MG) BY MOUTH AT BEDTIME, TAKE AN ADDITIONAL 1/2 TABLET (5 MG) IF NEEDED FOR SLEEP., Disp: 30 tablet, Rfl: 5  Current Facility-Administered Medications:  .  omalizumab (XOLAIR) injection 300 mg, 300 mg, Subcutaneous, Q14 Days, McQuaid, Douglas B,  MD, 300 mg at 06/09/18 1650 .  omalizumab Arvid Right) injection 300 mg, 300 mg, Subcutaneous, Q14 Days, McQuaid, Douglas B, MD, 300 mg at 07/01/18 1023

## 2018-07-08 NOTE — Progress Notes (Signed)
Subjective: Shane [name] presents today for follow up diabetic foot care.  He is accompanied by his wife who also has an appointment with [place] today.   He has cc of elongated,  painful, thick toenails 1-5 b/l that he cannot cut and which interfere with daily activities.  Pain is aggravated when wearing enclosed shoe gear.  Foot care risk factors: Diabetes, ESRD on hemodialysis MWF, hyperlipidemia, diabetic neuropathy, gout  Allergies  Allergen Reactions  . Codeine Other (See Comments)    anxiety  . Hydrocodone Other (See Comments)    Anxiety- can take in liquid form  . Pseudoephedrine Other (See Comments)    Causes heart to race  . Azithromycin Rash    Objective: Vascular Examination: Capillary refill time immediate x 10 digits Dorsalis pedis 1/4 b/l Posterior tibial pulses absent b/l Digital hair x 10 digits diminished Skin temperature gradient WNL b/l  Dermatological Examination: Skin with age related atrophy, thinning.  Normal texture and tone b/l  Toenails 1-5 b/l discolored, thick, dystrophic with subungual debris and pain with palpation to nailbeds due to thickness of nails.  No open wounds b/l.  No interdigital macerations b/l    Musculoskeletal: Muscle strength 5/5 to all LE muscle groups  Neurological: Sensation intact with 10 gram monofilament. Vibratory sensation intact.  Assessment: 1. Painful onychomycosis toenails 1-5 b/l  2. NIDDM with PAD 3. Diabetic neuropathy  Plan: 1. Toenails 1-5 b/l were debrided in length and girth without iatrogenic bleeding. 2. Patient to continue soft, supportive shoe gear 3. Patient to report any pedal injuries to medical professional immediately. 4. Follow up 3 months. Patient/POA to call should there be a concern in the interim.

## 2018-07-08 NOTE — Patient Instructions (Signed)
Mild exacerbation of asthma: Take prednisone 20 mg daily for 5 days Call me if no improvement by Friday so we can call in an antibiotic Take Breo 1 puff daily until you run out of the sample Next fall I think we could prevent an exacerbation like this by having you take an inhaled corticosteroid Continue Xolair Keep using albuterol as needed for chest tightness wheezing or shortness of breath Unfortunately we do not have stores of the Pneumovax today, we can give this to you on the next visit or you can have it sooner at a pharmacy or your primary care physician's office Stay active  We will see you back in 6 months or sooner if needed

## 2018-07-09 DIAGNOSIS — M5417 Radiculopathy, lumbosacral region: Secondary | ICD-10-CM | POA: Diagnosis not present

## 2018-07-09 DIAGNOSIS — M545 Low back pain: Secondary | ICD-10-CM | POA: Diagnosis not present

## 2018-07-10 DIAGNOSIS — E8779 Other fluid overload: Secondary | ICD-10-CM | POA: Diagnosis not present

## 2018-07-10 DIAGNOSIS — D631 Anemia in chronic kidney disease: Secondary | ICD-10-CM | POA: Diagnosis not present

## 2018-07-10 DIAGNOSIS — N186 End stage renal disease: Secondary | ICD-10-CM | POA: Diagnosis not present

## 2018-07-10 DIAGNOSIS — D509 Iron deficiency anemia, unspecified: Secondary | ICD-10-CM | POA: Diagnosis not present

## 2018-07-10 DIAGNOSIS — N2581 Secondary hyperparathyroidism of renal origin: Secondary | ICD-10-CM | POA: Diagnosis not present

## 2018-07-10 DIAGNOSIS — E1129 Type 2 diabetes mellitus with other diabetic kidney complication: Secondary | ICD-10-CM | POA: Diagnosis not present

## 2018-07-13 ENCOUNTER — Telehealth: Payer: Self-pay | Admitting: Pulmonary Disease

## 2018-07-13 DIAGNOSIS — N2581 Secondary hyperparathyroidism of renal origin: Secondary | ICD-10-CM | POA: Diagnosis not present

## 2018-07-13 DIAGNOSIS — N186 End stage renal disease: Secondary | ICD-10-CM | POA: Diagnosis not present

## 2018-07-13 DIAGNOSIS — E8779 Other fluid overload: Secondary | ICD-10-CM | POA: Diagnosis not present

## 2018-07-13 DIAGNOSIS — D631 Anemia in chronic kidney disease: Secondary | ICD-10-CM | POA: Diagnosis not present

## 2018-07-13 DIAGNOSIS — D509 Iron deficiency anemia, unspecified: Secondary | ICD-10-CM | POA: Diagnosis not present

## 2018-07-13 DIAGNOSIS — E1129 Type 2 diabetes mellitus with other diabetic kidney complication: Secondary | ICD-10-CM | POA: Diagnosis not present

## 2018-07-13 MED ORDER — DOXYCYCLINE HYCLATE 100 MG PO TABS: 100.0000 mg | ORAL_TABLET | Freq: Two times a day (BID) | ORAL | 0 refills | Status: AC

## 2018-07-13 NOTE — Telephone Encounter (Signed)
Called and spoke to patient. Patient stated the he has continued to have congestion, productive cough (yellow), headache but no fever. Patient stated the he is in dialysis all day so he is unable to come in for an acute visit with an NP.    Dr. Lake Bells please advise, thanks!

## 2018-07-13 NOTE — Telephone Encounter (Signed)
Called and spoke to patient. Advised patient of Dr. Anastasia Pall recommendations. Patient verbalized understanding and wants antibiotic sent into the [facility] on [address]. Medication sent.  Nothing further needed at this time.

## 2018-07-13 NOTE — Telephone Encounter (Signed)
Doxycycline 100mg  po bid x 5 days

## 2018-07-14 DIAGNOSIS — E291 Testicular hypofunction: Secondary | ICD-10-CM | POA: Diagnosis not present

## 2018-07-15 DIAGNOSIS — D631 Anemia in chronic kidney disease: Secondary | ICD-10-CM | POA: Diagnosis not present

## 2018-07-15 DIAGNOSIS — E8779 Other fluid overload: Secondary | ICD-10-CM | POA: Diagnosis not present

## 2018-07-15 DIAGNOSIS — N2581 Secondary hyperparathyroidism of renal origin: Secondary | ICD-10-CM | POA: Diagnosis not present

## 2018-07-15 DIAGNOSIS — N186 End stage renal disease: Secondary | ICD-10-CM | POA: Diagnosis not present

## 2018-07-15 DIAGNOSIS — E1129 Type 2 diabetes mellitus with other diabetic kidney complication: Secondary | ICD-10-CM | POA: Diagnosis not present

## 2018-07-15 DIAGNOSIS — D509 Iron deficiency anemia, unspecified: Secondary | ICD-10-CM | POA: Diagnosis not present

## 2018-07-17 DIAGNOSIS — N186 End stage renal disease: Secondary | ICD-10-CM | POA: Diagnosis not present

## 2018-07-17 DIAGNOSIS — N2581 Secondary hyperparathyroidism of renal origin: Secondary | ICD-10-CM | POA: Diagnosis not present

## 2018-07-17 DIAGNOSIS — E8779 Other fluid overload: Secondary | ICD-10-CM | POA: Diagnosis not present

## 2018-07-17 DIAGNOSIS — D631 Anemia in chronic kidney disease: Secondary | ICD-10-CM | POA: Diagnosis not present

## 2018-07-17 DIAGNOSIS — E1129 Type 2 diabetes mellitus with other diabetic kidney complication: Secondary | ICD-10-CM | POA: Diagnosis not present

## 2018-07-17 DIAGNOSIS — D509 Iron deficiency anemia, unspecified: Secondary | ICD-10-CM | POA: Diagnosis not present

## 2018-07-20 ENCOUNTER — Other Ambulatory Visit (HOSPITAL_COMMUNITY): Payer: Self-pay | Admitting: Internal Medicine

## 2018-07-20 DIAGNOSIS — N2581 Secondary hyperparathyroidism of renal origin: Secondary | ICD-10-CM | POA: Diagnosis not present

## 2018-07-20 DIAGNOSIS — D509 Iron deficiency anemia, unspecified: Secondary | ICD-10-CM | POA: Diagnosis not present

## 2018-07-20 DIAGNOSIS — N186 End stage renal disease: Secondary | ICD-10-CM | POA: Diagnosis not present

## 2018-07-20 DIAGNOSIS — E8779 Other fluid overload: Secondary | ICD-10-CM | POA: Diagnosis not present

## 2018-07-20 DIAGNOSIS — D631 Anemia in chronic kidney disease: Secondary | ICD-10-CM | POA: Diagnosis not present

## 2018-07-20 DIAGNOSIS — E1129 Type 2 diabetes mellitus with other diabetic kidney complication: Secondary | ICD-10-CM | POA: Diagnosis not present

## 2018-07-21 ENCOUNTER — Ambulatory Visit (INDEPENDENT_AMBULATORY_CARE_PROVIDER_SITE_OTHER): Payer: Medicare Other

## 2018-07-21 DIAGNOSIS — J4540 Moderate persistent asthma, uncomplicated: Secondary | ICD-10-CM | POA: Diagnosis not present

## 2018-07-22 ENCOUNTER — Other Ambulatory Visit: Payer: Self-pay | Admitting: Internal Medicine

## 2018-07-22 DIAGNOSIS — N186 End stage renal disease: Secondary | ICD-10-CM | POA: Diagnosis not present

## 2018-07-22 DIAGNOSIS — D631 Anemia in chronic kidney disease: Secondary | ICD-10-CM | POA: Diagnosis not present

## 2018-07-22 DIAGNOSIS — D509 Iron deficiency anemia, unspecified: Secondary | ICD-10-CM | POA: Diagnosis not present

## 2018-07-22 DIAGNOSIS — E8779 Other fluid overload: Secondary | ICD-10-CM | POA: Diagnosis not present

## 2018-07-22 DIAGNOSIS — E1129 Type 2 diabetes mellitus with other diabetic kidney complication: Secondary | ICD-10-CM | POA: Diagnosis not present

## 2018-07-22 DIAGNOSIS — N2581 Secondary hyperparathyroidism of renal origin: Secondary | ICD-10-CM | POA: Diagnosis not present

## 2018-07-22 MED ORDER — AMIODARONE HCL 200 MG PO TABS: 200.0000 mg | ORAL_TABLET | Freq: Every day | ORAL | 2 refills | Status: DC

## 2018-07-22 MED ORDER — ALLOPURINOL 100 MG PO TABS: 100.0000 mg | ORAL_TABLET | Freq: Every day | ORAL | 1 refills | Status: DC

## 2018-07-22 NOTE — Telephone Encounter (Signed)
Requested Prescriptions  Pending Prescriptions Disp Refills  . allopurinol (ZYLOPRIM) 100 MG tablet 90 tablet 1    Sig: Take 1 tablet (100 mg total) by mouth daily.     Endocrinology:  Gout Agents Failed - 07/22/2018  8:35 AM      Failed - Uric Acid in normal range and within 360 days    Uric Acid, Serum  Date Value Ref Range Status  12/16/2017 2.5 (L) 4.4 - 7.6 mg/dL Final    Comment:    Performed at [facility], [address] [place], [place] [address]   Uric Acid  Date Value Ref Range Status  10/23/2017 9.9 (H) 3.7 - 8.6 mg/dL Final    Comment:               Therapeutic target for gout patients: <6.0         Failed - Cr in normal range and within 360 days    Creat  Date Value Ref Range Status  07/01/2016 3.09 (H) 0.70 - 1.11 mg/dL Final    Comment:      For patients > or = 82 years of age: The upper reference limit for Creatinine is approximately 13% higher for people identified as African-American.      Creatinine, Ser  Date Value Ref Range Status  12/19/2017 4.34 (H) 0.61 - 1.24 mg/dL Final         Passed - Valid encounter within last 12 months    Recent Outpatient Visits          1 month ago Type 2 diabetes mellitus with chronic kidney disease, without long-term current use of insulin, unspecified CKD stage ([place])   [place], MD   6 months ago Acute gout due to renal impairment involving left knee   [place], Claudina Lick, MD   8 months ago Chronic gout of left knee, unspecified cause   [place], MD   10 months ago Chronic combined systolic and diastolic CHF (congestive heart failure) ([place])   [place], Claudina Lick, MD   1 year ago Type 2 diabetes mellitus with diabetic nephropathy, without long-term current use of insulin ([place])   [place], MD      Future  Appointments            In 4 months [place], Claudina Lick, MD [place], [facility]

## 2018-07-22 NOTE — Telephone Encounter (Signed)
Pt's medication was sent to pt's pharmacy as requested. Confirmation received.  °

## 2018-07-22 NOTE — Telephone Encounter (Signed)
Copied from [place] [id number]. Topic: Quick Communication - Rx Refill/Question >> Jul 22, 2018  8:14 AM Oneta Rack wrote:  Medication: 90 day supply allopurinol (ZYLOPRIM) 100 MG tablet   Has the patient contacted their pharmacy? Yes   (Agent: If yes, when and what did the pharmacy advise?) request was sent in to PCP office, chart doesn't reflect and patient is out therefore followed up with the status.   Preferred Pharmacy (with phone number or street name): [place], [place]  Agent: Please be advised that RX refills may take up to 3 business days. We ask that you follow-up with your pharmacy.

## 2018-07-24 DIAGNOSIS — D509 Iron deficiency anemia, unspecified: Secondary | ICD-10-CM | POA: Diagnosis not present

## 2018-07-24 DIAGNOSIS — N186 End stage renal disease: Secondary | ICD-10-CM | POA: Diagnosis not present

## 2018-07-24 DIAGNOSIS — N2581 Secondary hyperparathyroidism of renal origin: Secondary | ICD-10-CM | POA: Diagnosis not present

## 2018-07-24 DIAGNOSIS — D631 Anemia in chronic kidney disease: Secondary | ICD-10-CM | POA: Diagnosis not present

## 2018-07-24 DIAGNOSIS — E8779 Other fluid overload: Secondary | ICD-10-CM | POA: Diagnosis not present

## 2018-07-24 DIAGNOSIS — E1129 Type 2 diabetes mellitus with other diabetic kidney complication: Secondary | ICD-10-CM | POA: Diagnosis not present

## 2018-07-24 MED ORDER — OMALIZUMAB 150 MG ~~LOC~~ SOLR
300.0000 mg | Freq: Once | SUBCUTANEOUS | Status: AC
  Administered 2018-07-21: 300 mg via SUBCUTANEOUS

## 2018-07-25 DIAGNOSIS — E1129 Type 2 diabetes mellitus with other diabetic kidney complication: Secondary | ICD-10-CM | POA: Diagnosis not present

## 2018-07-25 DIAGNOSIS — D631 Anemia in chronic kidney disease: Secondary | ICD-10-CM | POA: Diagnosis not present

## 2018-07-25 DIAGNOSIS — E8779 Other fluid overload: Secondary | ICD-10-CM | POA: Diagnosis not present

## 2018-07-25 DIAGNOSIS — D509 Iron deficiency anemia, unspecified: Secondary | ICD-10-CM | POA: Diagnosis not present

## 2018-07-25 DIAGNOSIS — N186 End stage renal disease: Secondary | ICD-10-CM | POA: Diagnosis not present

## 2018-07-25 DIAGNOSIS — N2581 Secondary hyperparathyroidism of renal origin: Secondary | ICD-10-CM | POA: Diagnosis not present

## 2018-07-26 DIAGNOSIS — D509 Iron deficiency anemia, unspecified: Secondary | ICD-10-CM | POA: Diagnosis not present

## 2018-07-26 DIAGNOSIS — N186 End stage renal disease: Secondary | ICD-10-CM | POA: Diagnosis not present

## 2018-07-26 DIAGNOSIS — E1129 Type 2 diabetes mellitus with other diabetic kidney complication: Secondary | ICD-10-CM | POA: Diagnosis not present

## 2018-07-26 DIAGNOSIS — E8779 Other fluid overload: Secondary | ICD-10-CM | POA: Diagnosis not present

## 2018-07-26 DIAGNOSIS — D631 Anemia in chronic kidney disease: Secondary | ICD-10-CM | POA: Diagnosis not present

## 2018-07-26 DIAGNOSIS — N2581 Secondary hyperparathyroidism of renal origin: Secondary | ICD-10-CM | POA: Diagnosis not present

## 2018-07-28 DIAGNOSIS — N186 End stage renal disease: Secondary | ICD-10-CM | POA: Diagnosis not present

## 2018-07-28 DIAGNOSIS — N2581 Secondary hyperparathyroidism of renal origin: Secondary | ICD-10-CM | POA: Diagnosis not present

## 2018-07-28 DIAGNOSIS — E8779 Other fluid overload: Secondary | ICD-10-CM | POA: Diagnosis not present

## 2018-07-28 DIAGNOSIS — D631 Anemia in chronic kidney disease: Secondary | ICD-10-CM | POA: Diagnosis not present

## 2018-07-28 DIAGNOSIS — D509 Iron deficiency anemia, unspecified: Secondary | ICD-10-CM | POA: Diagnosis not present

## 2018-07-28 DIAGNOSIS — E1129 Type 2 diabetes mellitus with other diabetic kidney complication: Secondary | ICD-10-CM | POA: Diagnosis not present

## 2018-07-31 DIAGNOSIS — E1129 Type 2 diabetes mellitus with other diabetic kidney complication: Secondary | ICD-10-CM | POA: Diagnosis not present

## 2018-07-31 DIAGNOSIS — E8779 Other fluid overload: Secondary | ICD-10-CM | POA: Diagnosis not present

## 2018-07-31 DIAGNOSIS — D509 Iron deficiency anemia, unspecified: Secondary | ICD-10-CM | POA: Diagnosis not present

## 2018-07-31 DIAGNOSIS — N2581 Secondary hyperparathyroidism of renal origin: Secondary | ICD-10-CM | POA: Diagnosis not present

## 2018-07-31 DIAGNOSIS — D631 Anemia in chronic kidney disease: Secondary | ICD-10-CM | POA: Diagnosis not present

## 2018-07-31 DIAGNOSIS — N186 End stage renal disease: Secondary | ICD-10-CM | POA: Diagnosis not present

## 2018-08-02 DIAGNOSIS — E8779 Other fluid overload: Secondary | ICD-10-CM | POA: Diagnosis not present

## 2018-08-02 DIAGNOSIS — N186 End stage renal disease: Secondary | ICD-10-CM | POA: Diagnosis not present

## 2018-08-02 DIAGNOSIS — E1129 Type 2 diabetes mellitus with other diabetic kidney complication: Secondary | ICD-10-CM | POA: Diagnosis not present

## 2018-08-02 DIAGNOSIS — D509 Iron deficiency anemia, unspecified: Secondary | ICD-10-CM | POA: Diagnosis not present

## 2018-08-02 DIAGNOSIS — D631 Anemia in chronic kidney disease: Secondary | ICD-10-CM | POA: Diagnosis not present

## 2018-08-02 DIAGNOSIS — N2581 Secondary hyperparathyroidism of renal origin: Secondary | ICD-10-CM | POA: Diagnosis not present

## 2018-08-03 ENCOUNTER — Telehealth: Payer: Self-pay | Admitting: Pulmonary Disease

## 2018-08-03 NOTE — Telephone Encounter (Signed)
Prefilled Syringe: #150mg  4  #75mg  0 Ordered Date: 08/03/18 Shipping Date: 08/06/18

## 2018-08-04 DIAGNOSIS — N2581 Secondary hyperparathyroidism of renal origin: Secondary | ICD-10-CM | POA: Diagnosis not present

## 2018-08-04 DIAGNOSIS — N186 End stage renal disease: Secondary | ICD-10-CM | POA: Diagnosis not present

## 2018-08-04 DIAGNOSIS — D509 Iron deficiency anemia, unspecified: Secondary | ICD-10-CM | POA: Diagnosis not present

## 2018-08-04 DIAGNOSIS — E8779 Other fluid overload: Secondary | ICD-10-CM | POA: Diagnosis not present

## 2018-08-04 DIAGNOSIS — D631 Anemia in chronic kidney disease: Secondary | ICD-10-CM | POA: Diagnosis not present

## 2018-08-04 DIAGNOSIS — E1129 Type 2 diabetes mellitus with other diabetic kidney complication: Secondary | ICD-10-CM | POA: Diagnosis not present

## 2018-08-05 DIAGNOSIS — I129 Hypertensive chronic kidney disease with stage 1 through stage 4 chronic kidney disease, or unspecified chronic kidney disease: Secondary | ICD-10-CM | POA: Diagnosis not present

## 2018-08-05 DIAGNOSIS — N186 End stage renal disease: Secondary | ICD-10-CM | POA: Diagnosis not present

## 2018-08-05 DIAGNOSIS — Z992 Dependence on renal dialysis: Secondary | ICD-10-CM | POA: Diagnosis not present

## 2018-08-06 NOTE — Telephone Encounter (Signed)
Prefilled Syringes: # 150mg  4  #75mg  0 Arrival Date: 08/06/18 Lot #: 150mg  [id number]      75mg  0 Exp Date: 150mg  01/2019   75mg  0

## 2018-08-07 DIAGNOSIS — Z992 Dependence on renal dialysis: Secondary | ICD-10-CM | POA: Diagnosis not present

## 2018-08-07 DIAGNOSIS — D631 Anemia in chronic kidney disease: Secondary | ICD-10-CM | POA: Diagnosis not present

## 2018-08-07 DIAGNOSIS — N186 End stage renal disease: Secondary | ICD-10-CM | POA: Diagnosis not present

## 2018-08-10 ENCOUNTER — Telehealth (HOSPITAL_COMMUNITY): Payer: Self-pay

## 2018-08-10 DIAGNOSIS — D631 Anemia in chronic kidney disease: Secondary | ICD-10-CM | POA: Diagnosis not present

## 2018-08-10 DIAGNOSIS — N186 End stage renal disease: Secondary | ICD-10-CM | POA: Diagnosis not present

## 2018-08-10 DIAGNOSIS — D509 Iron deficiency anemia, unspecified: Secondary | ICD-10-CM | POA: Diagnosis not present

## 2018-08-10 DIAGNOSIS — N2581 Secondary hyperparathyroidism of renal origin: Secondary | ICD-10-CM | POA: Diagnosis not present

## 2018-08-10 DIAGNOSIS — E1129 Type 2 diabetes mellitus with other diabetic kidney complication: Secondary | ICD-10-CM | POA: Diagnosis not present

## 2018-08-10 NOTE — Telephone Encounter (Signed)
Pt called complaining of SOB. No edema, no weight gain, bp is normal, no other symptoms. Pt is currently taking torsemide 20 mg once daily. Please advise.

## 2018-08-10 NOTE — Telephone Encounter (Signed)
We haven't seen him in nearly a year and he is now on dialysis. He should call his nephrologist if he is concerned that he has extra fluid. If it sounds more like a respiratory infection, he should contact his PCP. Thanks.

## 2018-08-10 NOTE — Telephone Encounter (Signed)
Pt notified Verbalizes understanding 

## 2018-08-11 ENCOUNTER — Ambulatory Visit (INDEPENDENT_AMBULATORY_CARE_PROVIDER_SITE_OTHER): Payer: Medicare Other

## 2018-08-11 DIAGNOSIS — J4540 Moderate persistent asthma, uncomplicated: Secondary | ICD-10-CM

## 2018-08-11 DIAGNOSIS — E291 Testicular hypofunction: Secondary | ICD-10-CM | POA: Diagnosis not present

## 2018-08-11 MED ORDER — OMALIZUMAB 150 MG ~~LOC~~ SOLR
300.0000 mg | SUBCUTANEOUS | Status: DC
Start: 2018-08-11 — End: 2019-02-05
  Administered 2018-08-11 – 2018-12-29 (×3): 300 mg via SUBCUTANEOUS

## 2018-08-11 NOTE — Progress Notes (Signed)
Documentation of medication administration and charges of Xolair have been completed by Kimarie Coor, CMA based on the hand written Xolair documentation sheet completed by Tammy Scott, who administered the medication.  

## 2018-08-12 DIAGNOSIS — D631 Anemia in chronic kidney disease: Secondary | ICD-10-CM | POA: Diagnosis not present

## 2018-08-12 DIAGNOSIS — E1129 Type 2 diabetes mellitus with other diabetic kidney complication: Secondary | ICD-10-CM | POA: Diagnosis not present

## 2018-08-12 DIAGNOSIS — N186 End stage renal disease: Secondary | ICD-10-CM | POA: Diagnosis not present

## 2018-08-12 DIAGNOSIS — D509 Iron deficiency anemia, unspecified: Secondary | ICD-10-CM | POA: Diagnosis not present

## 2018-08-12 DIAGNOSIS — N2581 Secondary hyperparathyroidism of renal origin: Secondary | ICD-10-CM | POA: Diagnosis not present

## 2018-08-14 DIAGNOSIS — N2581 Secondary hyperparathyroidism of renal origin: Secondary | ICD-10-CM | POA: Diagnosis not present

## 2018-08-14 DIAGNOSIS — D509 Iron deficiency anemia, unspecified: Secondary | ICD-10-CM | POA: Diagnosis not present

## 2018-08-14 DIAGNOSIS — N186 End stage renal disease: Secondary | ICD-10-CM | POA: Diagnosis not present

## 2018-08-14 DIAGNOSIS — E1129 Type 2 diabetes mellitus with other diabetic kidney complication: Secondary | ICD-10-CM | POA: Diagnosis not present

## 2018-08-14 DIAGNOSIS — D631 Anemia in chronic kidney disease: Secondary | ICD-10-CM | POA: Diagnosis not present

## 2018-08-17 DIAGNOSIS — N186 End stage renal disease: Secondary | ICD-10-CM | POA: Diagnosis not present

## 2018-08-17 DIAGNOSIS — D631 Anemia in chronic kidney disease: Secondary | ICD-10-CM | POA: Diagnosis not present

## 2018-08-17 DIAGNOSIS — E1129 Type 2 diabetes mellitus with other diabetic kidney complication: Secondary | ICD-10-CM | POA: Diagnosis not present

## 2018-08-17 DIAGNOSIS — N2581 Secondary hyperparathyroidism of renal origin: Secondary | ICD-10-CM | POA: Diagnosis not present

## 2018-08-17 DIAGNOSIS — D509 Iron deficiency anemia, unspecified: Secondary | ICD-10-CM | POA: Diagnosis not present

## 2018-08-19 DIAGNOSIS — N2581 Secondary hyperparathyroidism of renal origin: Secondary | ICD-10-CM | POA: Diagnosis not present

## 2018-08-19 DIAGNOSIS — N186 End stage renal disease: Secondary | ICD-10-CM | POA: Diagnosis not present

## 2018-08-19 DIAGNOSIS — D631 Anemia in chronic kidney disease: Secondary | ICD-10-CM | POA: Diagnosis not present

## 2018-08-19 DIAGNOSIS — E1129 Type 2 diabetes mellitus with other diabetic kidney complication: Secondary | ICD-10-CM | POA: Diagnosis not present

## 2018-08-19 DIAGNOSIS — D509 Iron deficiency anemia, unspecified: Secondary | ICD-10-CM | POA: Diagnosis not present

## 2018-08-20 DIAGNOSIS — E291 Testicular hypofunction: Secondary | ICD-10-CM | POA: Diagnosis not present

## 2018-08-20 DIAGNOSIS — N529 Male erectile dysfunction, unspecified: Secondary | ICD-10-CM | POA: Diagnosis not present

## 2018-08-21 DIAGNOSIS — N2581 Secondary hyperparathyroidism of renal origin: Secondary | ICD-10-CM | POA: Diagnosis not present

## 2018-08-21 DIAGNOSIS — E291 Testicular hypofunction: Secondary | ICD-10-CM | POA: Diagnosis not present

## 2018-08-21 DIAGNOSIS — E1129 Type 2 diabetes mellitus with other diabetic kidney complication: Secondary | ICD-10-CM | POA: Diagnosis not present

## 2018-08-21 DIAGNOSIS — N186 End stage renal disease: Secondary | ICD-10-CM | POA: Diagnosis not present

## 2018-08-21 DIAGNOSIS — D509 Iron deficiency anemia, unspecified: Secondary | ICD-10-CM | POA: Diagnosis not present

## 2018-08-21 DIAGNOSIS — D631 Anemia in chronic kidney disease: Secondary | ICD-10-CM | POA: Diagnosis not present

## 2018-08-24 DIAGNOSIS — D631 Anemia in chronic kidney disease: Secondary | ICD-10-CM | POA: Diagnosis not present

## 2018-08-24 DIAGNOSIS — N186 End stage renal disease: Secondary | ICD-10-CM | POA: Diagnosis not present

## 2018-08-24 DIAGNOSIS — D509 Iron deficiency anemia, unspecified: Secondary | ICD-10-CM | POA: Diagnosis not present

## 2018-08-24 DIAGNOSIS — N2581 Secondary hyperparathyroidism of renal origin: Secondary | ICD-10-CM | POA: Diagnosis not present

## 2018-08-24 DIAGNOSIS — E1129 Type 2 diabetes mellitus with other diabetic kidney complication: Secondary | ICD-10-CM | POA: Diagnosis not present

## 2018-08-26 DIAGNOSIS — D509 Iron deficiency anemia, unspecified: Secondary | ICD-10-CM | POA: Diagnosis not present

## 2018-08-26 DIAGNOSIS — D631 Anemia in chronic kidney disease: Secondary | ICD-10-CM | POA: Diagnosis not present

## 2018-08-26 DIAGNOSIS — E1129 Type 2 diabetes mellitus with other diabetic kidney complication: Secondary | ICD-10-CM | POA: Diagnosis not present

## 2018-08-26 DIAGNOSIS — N186 End stage renal disease: Secondary | ICD-10-CM | POA: Diagnosis not present

## 2018-08-26 DIAGNOSIS — N2581 Secondary hyperparathyroidism of renal origin: Secondary | ICD-10-CM | POA: Diagnosis not present

## 2018-08-27 ENCOUNTER — Ambulatory Visit (INDEPENDENT_AMBULATORY_CARE_PROVIDER_SITE_OTHER): Payer: Medicare Other

## 2018-08-27 DIAGNOSIS — I255 Ischemic cardiomyopathy: Secondary | ICD-10-CM

## 2018-08-27 DIAGNOSIS — I5022 Chronic systolic (congestive) heart failure: Secondary | ICD-10-CM

## 2018-08-28 ENCOUNTER — Encounter: Payer: Self-pay | Admitting: Cardiology

## 2018-08-28 DIAGNOSIS — E1129 Type 2 diabetes mellitus with other diabetic kidney complication: Secondary | ICD-10-CM | POA: Diagnosis not present

## 2018-08-28 DIAGNOSIS — D631 Anemia in chronic kidney disease: Secondary | ICD-10-CM | POA: Diagnosis not present

## 2018-08-28 DIAGNOSIS — N2581 Secondary hyperparathyroidism of renal origin: Secondary | ICD-10-CM | POA: Diagnosis not present

## 2018-08-28 DIAGNOSIS — N186 End stage renal disease: Secondary | ICD-10-CM | POA: Diagnosis not present

## 2018-08-28 DIAGNOSIS — D509 Iron deficiency anemia, unspecified: Secondary | ICD-10-CM | POA: Diagnosis not present

## 2018-08-28 NOTE — Progress Notes (Signed)
Remote ICD transmission.   

## 2018-08-30 LAB — CUP PACEART REMOTE DEVICE CHECK
Battery Remaining Longevity: 30 mo
Battery Voltage: 2.91 V
Brady Statistic AP VP Percent: 0.15 %
Brady Statistic AP VS Percent: 0.01 %
Brady Statistic AS VP Percent: 97.97 %
Brady Statistic AS VS Percent: 1.87 %
Brady Statistic RV Percent Paced: 5.68 %
Date Time Interrogation Session: 20200123051604
HighPow Impedance: 67 Ohm
Implantable Lead Implant Date: 20160308
Implantable Lead Implant Date: 20160308
Implantable Lead Implant Date: 20160308
Implantable Lead Location: 753858
Implantable Lead Location: 753859
Implantable Lead Location: 753860
Implantable Lead Model: 4598
Implantable Lead Model: 5076
Implantable Lead Model: 6935
Implantable Pulse Generator Implant Date: 20160308
Lead Channel Impedance Value: 323 Ohm
Lead Channel Impedance Value: 342 Ohm
Lead Channel Impedance Value: 380 Ohm
Lead Channel Impedance Value: 380 Ohm
Lead Channel Impedance Value: 437 Ohm
Lead Channel Impedance Value: 456 Ohm
Lead Channel Impedance Value: 456 Ohm
Lead Channel Impedance Value: 570 Ohm
Lead Channel Impedance Value: 570 Ohm
Lead Channel Impedance Value: 703 Ohm
Lead Channel Impedance Value: 722 Ohm
Lead Channel Impedance Value: 722 Ohm
Lead Channel Pacing Threshold Amplitude: 0.875 V
Lead Channel Pacing Threshold Amplitude: 1 V
Lead Channel Pacing Threshold Pulse Width: 0.4 ms
Lead Channel Pacing Threshold Pulse Width: 0.4 ms
Lead Channel Pacing Threshold Pulse Width: 0.6 ms
Lead Channel Sensing Intrinsic Amplitude: 1.75 mV
Lead Channel Sensing Intrinsic Amplitude: 1.875 mV
Lead Channel Sensing Intrinsic Amplitude: 1.875 mV
Lead Channel Setting Pacing Amplitude: 2 V
Lead Channel Setting Pacing Amplitude: 2 V
Lead Channel Setting Pacing Amplitude: 4 V
Lead Channel Setting Pacing Pulse Width: 0.6 ms
Lead Channel Setting Pacing Pulse Width: 0.8 ms
Lead Channel Setting Sensing Sensitivity: 0.3 mV
MDC IDC MSMT LEADCHNL LV IMPEDANCE VALUE: 399 Ohm
MDC IDC MSMT LEADCHNL RV PACING THRESHOLD AMPLITUDE: 2.375 V
MDC IDC MSMT LEADCHNL RV SENSING INTR AMPL: 1.75 mV
MDC IDC STAT BRADY RA PERCENT PACED: 0.16 %

## 2018-08-31 DIAGNOSIS — D631 Anemia in chronic kidney disease: Secondary | ICD-10-CM | POA: Diagnosis not present

## 2018-08-31 DIAGNOSIS — D509 Iron deficiency anemia, unspecified: Secondary | ICD-10-CM | POA: Diagnosis not present

## 2018-08-31 DIAGNOSIS — N2581 Secondary hyperparathyroidism of renal origin: Secondary | ICD-10-CM | POA: Diagnosis not present

## 2018-08-31 DIAGNOSIS — N186 End stage renal disease: Secondary | ICD-10-CM | POA: Diagnosis not present

## 2018-08-31 DIAGNOSIS — E1129 Type 2 diabetes mellitus with other diabetic kidney complication: Secondary | ICD-10-CM | POA: Diagnosis not present

## 2018-09-01 ENCOUNTER — Ambulatory Visit (INDEPENDENT_AMBULATORY_CARE_PROVIDER_SITE_OTHER): Payer: Medicare Other

## 2018-09-01 DIAGNOSIS — E291 Testicular hypofunction: Secondary | ICD-10-CM | POA: Diagnosis not present

## 2018-09-01 DIAGNOSIS — J4540 Moderate persistent asthma, uncomplicated: Secondary | ICD-10-CM

## 2018-09-01 MED ORDER — OMALIZUMAB 150 MG ~~LOC~~ SOLR
300.0000 mg | Freq: Once | SUBCUTANEOUS | Status: AC
  Administered 2018-09-01: 300 mg via SUBCUTANEOUS

## 2018-09-02 DIAGNOSIS — N186 End stage renal disease: Secondary | ICD-10-CM | POA: Diagnosis not present

## 2018-09-02 DIAGNOSIS — E1129 Type 2 diabetes mellitus with other diabetic kidney complication: Secondary | ICD-10-CM | POA: Diagnosis not present

## 2018-09-02 DIAGNOSIS — N2581 Secondary hyperparathyroidism of renal origin: Secondary | ICD-10-CM | POA: Diagnosis not present

## 2018-09-02 DIAGNOSIS — D509 Iron deficiency anemia, unspecified: Secondary | ICD-10-CM | POA: Diagnosis not present

## 2018-09-02 DIAGNOSIS — D631 Anemia in chronic kidney disease: Secondary | ICD-10-CM | POA: Diagnosis not present

## 2018-09-04 DIAGNOSIS — D509 Iron deficiency anemia, unspecified: Secondary | ICD-10-CM | POA: Diagnosis not present

## 2018-09-04 DIAGNOSIS — N2581 Secondary hyperparathyroidism of renal origin: Secondary | ICD-10-CM | POA: Diagnosis not present

## 2018-09-04 DIAGNOSIS — N186 End stage renal disease: Secondary | ICD-10-CM | POA: Diagnosis not present

## 2018-09-04 DIAGNOSIS — D631 Anemia in chronic kidney disease: Secondary | ICD-10-CM | POA: Diagnosis not present

## 2018-09-04 DIAGNOSIS — E1129 Type 2 diabetes mellitus with other diabetic kidney complication: Secondary | ICD-10-CM | POA: Diagnosis not present

## 2018-09-05 DIAGNOSIS — I129 Hypertensive chronic kidney disease with stage 1 through stage 4 chronic kidney disease, or unspecified chronic kidney disease: Secondary | ICD-10-CM | POA: Diagnosis not present

## 2018-09-05 DIAGNOSIS — N186 End stage renal disease: Secondary | ICD-10-CM | POA: Diagnosis not present

## 2018-09-05 DIAGNOSIS — Z992 Dependence on renal dialysis: Secondary | ICD-10-CM | POA: Diagnosis not present

## 2018-09-07 DIAGNOSIS — N2581 Secondary hyperparathyroidism of renal origin: Secondary | ICD-10-CM | POA: Diagnosis not present

## 2018-09-07 DIAGNOSIS — D631 Anemia in chronic kidney disease: Secondary | ICD-10-CM | POA: Diagnosis not present

## 2018-09-07 DIAGNOSIS — N186 End stage renal disease: Secondary | ICD-10-CM | POA: Diagnosis not present

## 2018-09-07 DIAGNOSIS — E1129 Type 2 diabetes mellitus with other diabetic kidney complication: Secondary | ICD-10-CM | POA: Diagnosis not present

## 2018-09-07 DIAGNOSIS — D509 Iron deficiency anemia, unspecified: Secondary | ICD-10-CM | POA: Diagnosis not present

## 2018-09-09 DIAGNOSIS — D509 Iron deficiency anemia, unspecified: Secondary | ICD-10-CM | POA: Diagnosis not present

## 2018-09-09 DIAGNOSIS — N2581 Secondary hyperparathyroidism of renal origin: Secondary | ICD-10-CM | POA: Diagnosis not present

## 2018-09-09 DIAGNOSIS — D631 Anemia in chronic kidney disease: Secondary | ICD-10-CM | POA: Diagnosis not present

## 2018-09-09 DIAGNOSIS — N186 End stage renal disease: Secondary | ICD-10-CM | POA: Diagnosis not present

## 2018-09-09 DIAGNOSIS — E1129 Type 2 diabetes mellitus with other diabetic kidney complication: Secondary | ICD-10-CM | POA: Diagnosis not present

## 2018-09-11 DIAGNOSIS — N2581 Secondary hyperparathyroidism of renal origin: Secondary | ICD-10-CM | POA: Diagnosis not present

## 2018-09-11 DIAGNOSIS — N186 End stage renal disease: Secondary | ICD-10-CM | POA: Diagnosis not present

## 2018-09-14 ENCOUNTER — Telehealth: Payer: Self-pay | Admitting: Pulmonary Disease

## 2018-09-14 DIAGNOSIS — N186 End stage renal disease: Secondary | ICD-10-CM | POA: Diagnosis not present

## 2018-09-14 DIAGNOSIS — N2581 Secondary hyperparathyroidism of renal origin: Secondary | ICD-10-CM | POA: Diagnosis not present

## 2018-09-14 NOTE — Telephone Encounter (Signed)
Prefilled Syringe: #150mg  3  #75mg  2 (1 dose of 150mg  PFS on hand) Ordered Date: 2.10.2020 Shipping Date: 2.12.2020

## 2018-09-16 DIAGNOSIS — N2581 Secondary hyperparathyroidism of renal origin: Secondary | ICD-10-CM | POA: Diagnosis not present

## 2018-09-16 DIAGNOSIS — N186 End stage renal disease: Secondary | ICD-10-CM | POA: Diagnosis not present

## 2018-09-18 DIAGNOSIS — N186 End stage renal disease: Secondary | ICD-10-CM | POA: Diagnosis not present

## 2018-09-18 DIAGNOSIS — D631 Anemia in chronic kidney disease: Secondary | ICD-10-CM | POA: Diagnosis not present

## 2018-09-18 DIAGNOSIS — E1129 Type 2 diabetes mellitus with other diabetic kidney complication: Secondary | ICD-10-CM | POA: Diagnosis not present

## 2018-09-18 DIAGNOSIS — D509 Iron deficiency anemia, unspecified: Secondary | ICD-10-CM | POA: Diagnosis not present

## 2018-09-18 DIAGNOSIS — N2581 Secondary hyperparathyroidism of renal origin: Secondary | ICD-10-CM | POA: Diagnosis not present

## 2018-09-21 DIAGNOSIS — D631 Anemia in chronic kidney disease: Secondary | ICD-10-CM | POA: Diagnosis not present

## 2018-09-21 DIAGNOSIS — D509 Iron deficiency anemia, unspecified: Secondary | ICD-10-CM | POA: Diagnosis not present

## 2018-09-21 DIAGNOSIS — N186 End stage renal disease: Secondary | ICD-10-CM | POA: Diagnosis not present

## 2018-09-21 DIAGNOSIS — E1129 Type 2 diabetes mellitus with other diabetic kidney complication: Secondary | ICD-10-CM | POA: Diagnosis not present

## 2018-09-21 DIAGNOSIS — N2581 Secondary hyperparathyroidism of renal origin: Secondary | ICD-10-CM | POA: Diagnosis not present

## 2018-09-22 DIAGNOSIS — E291 Testicular hypofunction: Secondary | ICD-10-CM | POA: Diagnosis not present

## 2018-09-22 NOTE — Telephone Encounter (Signed)
Prefilled Syringes: # 150mg  4  #75mg  0 Arrival Date: 09/22/2018 Lot #: [id number]       75mg  0 Exp Date: 150mg  07/2019   75mg  0

## 2018-09-23 DIAGNOSIS — N186 End stage renal disease: Secondary | ICD-10-CM | POA: Diagnosis not present

## 2018-09-23 DIAGNOSIS — N2581 Secondary hyperparathyroidism of renal origin: Secondary | ICD-10-CM | POA: Diagnosis not present

## 2018-09-23 DIAGNOSIS — E1129 Type 2 diabetes mellitus with other diabetic kidney complication: Secondary | ICD-10-CM | POA: Diagnosis not present

## 2018-09-23 DIAGNOSIS — D509 Iron deficiency anemia, unspecified: Secondary | ICD-10-CM | POA: Diagnosis not present

## 2018-09-23 DIAGNOSIS — D631 Anemia in chronic kidney disease: Secondary | ICD-10-CM | POA: Diagnosis not present

## 2018-09-24 ENCOUNTER — Ambulatory Visit: Payer: Medicare Other

## 2018-09-24 ENCOUNTER — Telehealth: Payer: Self-pay | Admitting: Pulmonary Disease

## 2018-09-25 DIAGNOSIS — N186 End stage renal disease: Secondary | ICD-10-CM | POA: Diagnosis not present

## 2018-09-25 DIAGNOSIS — D631 Anemia in chronic kidney disease: Secondary | ICD-10-CM | POA: Diagnosis not present

## 2018-09-25 DIAGNOSIS — N2581 Secondary hyperparathyroidism of renal origin: Secondary | ICD-10-CM | POA: Diagnosis not present

## 2018-09-25 DIAGNOSIS — D509 Iron deficiency anemia, unspecified: Secondary | ICD-10-CM | POA: Diagnosis not present

## 2018-09-25 DIAGNOSIS — E1129 Type 2 diabetes mellitus with other diabetic kidney complication: Secondary | ICD-10-CM | POA: Diagnosis not present

## 2018-09-25 NOTE — Telephone Encounter (Signed)
Done, pt has an appt for 09/29/2018 at 11:30. Nothing further needed.

## 2018-09-28 DIAGNOSIS — D631 Anemia in chronic kidney disease: Secondary | ICD-10-CM | POA: Diagnosis not present

## 2018-09-28 DIAGNOSIS — E1129 Type 2 diabetes mellitus with other diabetic kidney complication: Secondary | ICD-10-CM | POA: Diagnosis not present

## 2018-09-28 DIAGNOSIS — D509 Iron deficiency anemia, unspecified: Secondary | ICD-10-CM | POA: Diagnosis not present

## 2018-09-28 DIAGNOSIS — N2581 Secondary hyperparathyroidism of renal origin: Secondary | ICD-10-CM | POA: Diagnosis not present

## 2018-09-28 DIAGNOSIS — N186 End stage renal disease: Secondary | ICD-10-CM | POA: Diagnosis not present

## 2018-09-29 ENCOUNTER — Ambulatory Visit (INDEPENDENT_AMBULATORY_CARE_PROVIDER_SITE_OTHER): Payer: Medicare Other

## 2018-09-29 DIAGNOSIS — J4540 Moderate persistent asthma, uncomplicated: Secondary | ICD-10-CM

## 2018-09-29 MED ORDER — OMALIZUMAB 150 MG ~~LOC~~ SOLR
300.0000 mg | Freq: Once | SUBCUTANEOUS | Status: AC
  Administered 2018-09-29: 300 mg via SUBCUTANEOUS

## 2018-09-30 DIAGNOSIS — E1129 Type 2 diabetes mellitus with other diabetic kidney complication: Secondary | ICD-10-CM | POA: Diagnosis not present

## 2018-09-30 DIAGNOSIS — N2581 Secondary hyperparathyroidism of renal origin: Secondary | ICD-10-CM | POA: Diagnosis not present

## 2018-09-30 DIAGNOSIS — D509 Iron deficiency anemia, unspecified: Secondary | ICD-10-CM | POA: Diagnosis not present

## 2018-09-30 DIAGNOSIS — N186 End stage renal disease: Secondary | ICD-10-CM | POA: Diagnosis not present

## 2018-09-30 DIAGNOSIS — D631 Anemia in chronic kidney disease: Secondary | ICD-10-CM | POA: Diagnosis not present

## 2018-10-02 DIAGNOSIS — N186 End stage renal disease: Secondary | ICD-10-CM | POA: Diagnosis not present

## 2018-10-02 DIAGNOSIS — D631 Anemia in chronic kidney disease: Secondary | ICD-10-CM | POA: Diagnosis not present

## 2018-10-02 DIAGNOSIS — E1129 Type 2 diabetes mellitus with other diabetic kidney complication: Secondary | ICD-10-CM | POA: Diagnosis not present

## 2018-10-02 DIAGNOSIS — D509 Iron deficiency anemia, unspecified: Secondary | ICD-10-CM | POA: Diagnosis not present

## 2018-10-02 DIAGNOSIS — N2581 Secondary hyperparathyroidism of renal origin: Secondary | ICD-10-CM | POA: Diagnosis not present

## 2018-10-04 DIAGNOSIS — N186 End stage renal disease: Secondary | ICD-10-CM | POA: Diagnosis not present

## 2018-10-04 DIAGNOSIS — I129 Hypertensive chronic kidney disease with stage 1 through stage 4 chronic kidney disease, or unspecified chronic kidney disease: Secondary | ICD-10-CM | POA: Diagnosis not present

## 2018-10-04 DIAGNOSIS — Z992 Dependence on renal dialysis: Secondary | ICD-10-CM | POA: Diagnosis not present

## 2018-10-05 DIAGNOSIS — E1129 Type 2 diabetes mellitus with other diabetic kidney complication: Secondary | ICD-10-CM | POA: Diagnosis not present

## 2018-10-05 DIAGNOSIS — Z23 Encounter for immunization: Secondary | ICD-10-CM | POA: Diagnosis not present

## 2018-10-05 DIAGNOSIS — N2581 Secondary hyperparathyroidism of renal origin: Secondary | ICD-10-CM | POA: Diagnosis not present

## 2018-10-05 DIAGNOSIS — N186 End stage renal disease: Secondary | ICD-10-CM | POA: Diagnosis not present

## 2018-10-05 DIAGNOSIS — D509 Iron deficiency anemia, unspecified: Secondary | ICD-10-CM | POA: Diagnosis not present

## 2018-10-05 DIAGNOSIS — D631 Anemia in chronic kidney disease: Secondary | ICD-10-CM | POA: Diagnosis not present

## 2018-10-06 ENCOUNTER — Ambulatory Visit (INDEPENDENT_AMBULATORY_CARE_PROVIDER_SITE_OTHER): Payer: Medicare Other | Admitting: Podiatry

## 2018-10-06 ENCOUNTER — Encounter: Payer: Self-pay | Admitting: Podiatry

## 2018-10-06 DIAGNOSIS — E1151 Type 2 diabetes mellitus with diabetic peripheral angiopathy without gangrene: Secondary | ICD-10-CM

## 2018-10-06 DIAGNOSIS — R21 Rash and other nonspecific skin eruption: Secondary | ICD-10-CM | POA: Diagnosis not present

## 2018-10-06 DIAGNOSIS — M79675 Pain in left toe(s): Secondary | ICD-10-CM

## 2018-10-06 DIAGNOSIS — L853 Xerosis cutis: Secondary | ICD-10-CM | POA: Diagnosis not present

## 2018-10-06 DIAGNOSIS — B351 Tinea unguium: Secondary | ICD-10-CM | POA: Diagnosis not present

## 2018-10-06 MED ORDER — HYDROCORTISONE 0.5 % EX OINT: 1.0000 "application " | TOPICAL_OINTMENT | Freq: Two times a day (BID) | CUTANEOUS | 0 refills | Status: AC

## 2018-10-06 MED ORDER — AMMONIUM LACTATE 12 % EX LOTN: 1.0000 "application " | TOPICAL_LOTION | Freq: Two times a day (BID) | CUTANEOUS | 4 refills | Status: DC

## 2018-10-06 NOTE — Patient Instructions (Addendum)
Rash, Adult  A rash is a change in the color of your skin. A rash can also change the way your skin feels. There are many different conditions and factors that can cause a rash. Follow these instructions at home: The goal of treatment is to stop the itching and keep the rash from spreading. Watch for any changes in your symptoms. Let your doctor know about them. Follow these instructions to help with your condition: Medicine Take or apply over-the-counter and prescription medicines only as told by your doctor. These may include medicines:  To treat red or swollen skin (corticosteroid creams).  To treat itching.  To treat an allergy (oral antihistamines).  To treat very bad symptoms (oral corticosteroids).  Skin care  Put cool cloths (compresses) on the affected areas.  Do not scratch or rub your skin.  Avoid covering the rash. Make sure that the rash is exposed to air as much as possible. Managing itching and discomfort  Avoid hot showers or baths. These can make itching worse. A cold shower may help.  Try taking a bath with: ? Epsom salts. You can get these at your local pharmacy or grocery store. Follow the instructions on the package. ? Baking soda. Pour a small amount into the bath as told by your doctor. ? Colloidal oatmeal. You can get this at your local pharmacy or grocery store. Follow the instructions on the package.  Try putting baking soda paste onto your skin. Stir water into baking soda until it gets like a paste.  Try putting on a lotion that relieves itchiness (calamine lotion).  Keep cool and out of the sun. Sweating and being hot can make itching worse. General instructions   Rest as needed.  Drink enough fluid to keep your pee (urine) pale yellow.  Wear loose-fitting clothing.  Avoid scented soaps, detergents, and perfumes. Use gentle soaps, detergents, perfumes, and other cosmetic products.  Avoid anything that causes your rash. Keep a journal to  help track what causes your rash. Write down: ? What you eat. ? What cosmetic products you use. ? What you drink. ? What you wear. This includes jewelry.  Keep all follow-up visits as told by your doctor. This is important. Contact a doctor if:  You sweat at night.  You lose weight.  You pee (urinate) more than normal.  You pee less than normal, or you notice that your pee is a darker color than normal.  You feel weak.  You throw up (vomit).  Your skin or the whites of your eyes look yellow (jaundice).  Your skin: ? Tingles. ? Is numb.  Your rash: ? Does not go away after a few days. ? Gets worse.  You are: ? More thirsty than normal. ? More tired than normal.  You have: ? New symptoms. ? Pain in your belly (abdomen). ? A fever. ? Watery poop (diarrhea). Get help right away if:  You have a fever and your symptoms suddenly get worse.  You start to feel mixed up (confused).  You have a very bad headache or a stiff neck.  You have very bad joint pains or stiffness.  You have jerky movements that you cannot control (seizure).  Your rash covers all or most of your body. The rash may or may not be painful.  You have blisters that: ? Are on top of the rash. ? Grow larger. ? Grow together. ? Are painful. ? Are inside your nose or mouth.  You have a  rash that: ? Looks like purple pinprick-sized spots all over your body. ? Has a "bull's eye" or looks like a target. ? Is red and painful, causes your skin to peel, and is not from being in the sun too long. Summary  A rash is a change in the color of your skin. A rash can also change the way your skin feels.  The goal of treatment is to stop the itching and keep the rash from spreading.  Take or apply over-the-counter and prescription medicines only as told by your doctor.  Contact a doctor if you have new symptoms or symptoms that get worse.  Keep all follow-up visits as told by your doctor. This is  important. This information is not intended to replace advice given to you by your health care provider. Make sure you discuss any questions you have with your health care provider. Document Released: 01/08/2008 Document Revised: 02/23/2018 Document Reviewed: 02/23/2018 Elsevier Interactive Patient Education  2019 [employer] Inc[employer]  Onychomycosis/Fungal Toenails  WHAT IS IT? An infection that lies within the keratin of your nail plate that is caused by a fungus.  WHY ME? Fungal infections affect all ages, sexes, races, and creeds.  There may be many factors that predispose you to a fungal infection such as age, coexisting medical conditions such as diabetes, or an autoimmune disease; stress, medications, fatigue, genetics, etc.  Bottom line: fungus thrives in a warm, moist environment and your shoes offer such a location.  IS IT CONTAGIOUS? Theoretically, yes.  You do not want to share shoes, nail clippers or files with someone who has fungal toenails.  Walking around barefoot in the same room or sleeping in the same bed is unlikely to transfer the organism.  It is important to realize, however, that fungus can spread easily from one nail to the next on the same foot.  HOW DO WE TREAT THIS?  There are several ways to treat this condition.  Treatment may depend on many factors such as age, medications, pregnancy, liver and kidney conditions, etc.  It is best to ask your doctor which options are available to you.  1. No treatment.   Unlike many other medical concerns, you can live with this condition.  However for many people this can be a painful condition and may lead to ingrown toenails or a bacterial infection.  It is recommended that you keep the nails cut short to help reduce the amount of fungal nail. 2. Topical treatment.  These range from herbal remedies to prescription strength nail lacquers.  About 40-50% effective, topicals require twice daily application for approximately 9 to 12 months or  until an entirely new nail has grown out.  The most effective topicals are medical grade medications available through physicians offices. 3. Oral antifungal medications.  With an 80-90% cure rate, the most common oral medication requires 3 to 4 months of therapy and stays in your system for a year as the new nail grows out.  Oral antifungal medications do require blood work to make sure it is a safe drug for you.  A liver function panel will be performed prior to starting the medication and after the first month of treatment.  It is important to have the blood work performed to avoid any harmful side effects.  In general, this medication safe but blood work is required. 4. Laser Therapy.  This treatment is performed by applying a specialized laser to the affected nail plate.  This therapy is noninvasive, fast, and  non-painful.  It is not covered by insurance and is therefore, out of pocket.  The results have been very good with a 80-95% cure rate.  [facility] is the only practice in the area to offer this therapy. 5. Permanent Nail Avulsion.  Removing the entire nail so that a new nail will not grow back. Diabetes Mellitus and Foot Care Foot care is an important part of your health, especially when you have diabetes. Diabetes may cause you to have problems because of poor blood flow (circulation) to your feet and legs, which can cause your skin to:  Become thinner and drier.  Break more easily.  Heal more slowly.  Peel and crack. You may also have nerve damage (neuropathy) in your legs and feet, causing decreased feeling in them. This means that you may not notice minor injuries to your feet that could lead to more serious problems. Noticing and addressing any potential problems early is the best way to prevent future foot problems. How to care for your feet Foot hygiene  Wash your feet daily with warm water and mild soap. Do not use hot water. Then, pat your feet and the areas between  your toes until they are completely dry. Do not soak your feet as this can dry your skin.  Trim your toenails straight across. Do not dig under them or around the cuticle. File the edges of your nails with an emery board or nail file.  Apply a moisturizing lotion or petroleum jelly to the skin on your feet and to dry, brittle toenails. Use lotion that does not contain alcohol and is unscented. Do not apply lotion between your toes. Shoes and socks  Wear clean socks or stockings every day. Make sure they are not too tight. Do not wear knee-high stockings since they may decrease blood flow to your legs.  Wear shoes that fit properly and have enough cushioning. Always look in your shoes before you put them on to be sure there are no objects inside.  To break in new shoes, wear them for just a few hours a day. This prevents injuries on your feet. Wounds, scrapes, corns, and calluses  Check your feet daily for blisters, cuts, bruises, sores, and redness. If you cannot see the bottom of your feet, use a mirror or ask someone for help.  Do not cut corns or calluses or try to remove them with medicine.  If you find a minor scrape, cut, or break in the skin on your feet, keep it and the skin around it clean and dry. You may clean these areas with mild soap and water. Do not clean the area with peroxide, alcohol, or iodine.  If you have a wound, scrape, corn, or callus on your foot, look at it several times a day to make sure it is healing and not infected. Check for: ? Redness, swelling, or pain. ? Fluid or blood. ? Warmth. ? Pus or a bad smell. General instructions  Do not cross your legs. This may decrease blood flow to your feet.  Do not use heating pads or hot water bottles on your feet. They may burn your skin. If you have lost feeling in your feet or legs, you may not know this is happening until it is too late.  Protect your feet from hot and cold by wearing shoes, such as at the beach  or on hot pavement.  Schedule a complete foot exam at least once a year (annually) or  more often if you have foot problems. If you have foot problems, report any cuts, sores, or bruises to your health care provider immediately. Contact a health care provider if:  You have a medical condition that increases your risk of infection and you have any cuts, sores, or bruises on your feet.  You have an injury that is not healing.  You have redness on your legs or feet.  You feel burning or tingling in your legs or feet.  You have pain or cramps in your legs and feet.  Your legs or feet are numb.  Your feet always feel cold.  You have pain around a toenail. Get help right away if:  You have a wound, scrape, corn, or callus on your foot and: ? You have pain, swelling, or redness that gets worse. ? You have fluid or blood coming from the wound, scrape, corn, or callus. ? Your wound, scrape, corn, or callus feels warm to the touch. ? You have pus or a bad smell coming from the wound, scrape, corn, or callus. ? You have a fever. ? You have a red line going up your leg. Summary  Check your feet every day for cuts, sores, red spots, swelling, and blisters.  Moisturize feet and legs daily.  Wear shoes that fit properly and have enough cushioning.  If you have foot problems, report any cuts, sores, or bruises to your health care provider immediately.  Schedule a complete foot exam at least once a year (annually) or more often if you have foot problems. This information is not intended to replace advice given to you by your health care provider. Make sure you discuss any questions you have with your health care provider. Document Released: 07/19/2000 Document Revised: 09/03/2017 Document Reviewed: 08/23/2016 Elsevier Interactive Patient Education  2019 [employer].

## 2018-10-07 DIAGNOSIS — D509 Iron deficiency anemia, unspecified: Secondary | ICD-10-CM | POA: Diagnosis not present

## 2018-10-07 DIAGNOSIS — E1129 Type 2 diabetes mellitus with other diabetic kidney complication: Secondary | ICD-10-CM | POA: Diagnosis not present

## 2018-10-07 DIAGNOSIS — N186 End stage renal disease: Secondary | ICD-10-CM | POA: Diagnosis not present

## 2018-10-07 DIAGNOSIS — D631 Anemia in chronic kidney disease: Secondary | ICD-10-CM | POA: Diagnosis not present

## 2018-10-07 DIAGNOSIS — Z23 Encounter for immunization: Secondary | ICD-10-CM | POA: Diagnosis not present

## 2018-10-07 DIAGNOSIS — N2581 Secondary hyperparathyroidism of renal origin: Secondary | ICD-10-CM | POA: Diagnosis not present

## 2018-10-09 DIAGNOSIS — N2581 Secondary hyperparathyroidism of renal origin: Secondary | ICD-10-CM | POA: Diagnosis not present

## 2018-10-09 DIAGNOSIS — N186 End stage renal disease: Secondary | ICD-10-CM | POA: Diagnosis not present

## 2018-10-09 DIAGNOSIS — D509 Iron deficiency anemia, unspecified: Secondary | ICD-10-CM | POA: Diagnosis not present

## 2018-10-09 DIAGNOSIS — Z23 Encounter for immunization: Secondary | ICD-10-CM | POA: Diagnosis not present

## 2018-10-09 DIAGNOSIS — E1129 Type 2 diabetes mellitus with other diabetic kidney complication: Secondary | ICD-10-CM | POA: Diagnosis not present

## 2018-10-09 DIAGNOSIS — D631 Anemia in chronic kidney disease: Secondary | ICD-10-CM | POA: Diagnosis not present

## 2018-10-12 ENCOUNTER — Telehealth: Payer: Self-pay | Admitting: Pulmonary Disease

## 2018-10-12 DIAGNOSIS — Z23 Encounter for immunization: Secondary | ICD-10-CM | POA: Diagnosis not present

## 2018-10-12 DIAGNOSIS — E1129 Type 2 diabetes mellitus with other diabetic kidney complication: Secondary | ICD-10-CM | POA: Diagnosis not present

## 2018-10-12 DIAGNOSIS — D631 Anemia in chronic kidney disease: Secondary | ICD-10-CM | POA: Diagnosis not present

## 2018-10-12 DIAGNOSIS — N186 End stage renal disease: Secondary | ICD-10-CM | POA: Diagnosis not present

## 2018-10-12 DIAGNOSIS — N2581 Secondary hyperparathyroidism of renal origin: Secondary | ICD-10-CM | POA: Diagnosis not present

## 2018-10-12 DIAGNOSIS — D509 Iron deficiency anemia, unspecified: Secondary | ICD-10-CM | POA: Diagnosis not present

## 2018-10-12 NOTE — Telephone Encounter (Signed)
Pt called reporting cough, congestion and DOE. No wheezing, fevers, chills, body aches or recent travel. No sick contacts  I offered an acute visit tomorrow and he has agreed to come in. Please call him in AM and make an acute visit with Dr. Lake Bells or NP.  Marshell Garfinkel MD [facility] 10/12/2018, 6:45 PM

## 2018-10-13 ENCOUNTER — Ambulatory Visit (INDEPENDENT_AMBULATORY_CARE_PROVIDER_SITE_OTHER): Payer: Medicare Other | Admitting: Primary Care

## 2018-10-13 ENCOUNTER — Encounter: Payer: Self-pay | Admitting: Primary Care

## 2018-10-13 VITALS — BP 132/70 | HR 84 | Temp 98.0°F | Ht 70.0 in | Wt 207.6 lb

## 2018-10-13 DIAGNOSIS — J40 Bronchitis, not specified as acute or chronic: Secondary | ICD-10-CM

## 2018-10-13 MED ORDER — FLUTICASONE FUROATE-VILANTEROL 200-25 MCG/INH IN AEPB: 1.0000 | INHALATION_SPRAY | Freq: Every day | RESPIRATORY_TRACT | 3 refills | Status: DC

## 2018-10-13 MED ORDER — DOXYCYCLINE HYCLATE 100 MG PO TABS: 100.0000 mg | ORAL_TABLET | Freq: Two times a day (BID) | ORAL | 0 refills | Status: DC

## 2018-10-13 MED ORDER — FLUTICASONE FUROATE-VILANTEROL 100-25 MCG/INH IN AEPB: 1.0000 | INHALATION_SPRAY | Freq: Every day | RESPIRATORY_TRACT | 1 refills | Status: DC

## 2018-10-13 NOTE — Assessment & Plan Note (Addendum)
-   Does not appear exacerbated on exam - Patient noticed an improvement with previous sample of BREO - Resume Breo 200 one puff daily (sample given and RX sent)

## 2018-10-13 NOTE — Assessment & Plan Note (Signed)
-   Productive cough x 1 week - Exam benign, lungs clear. VSS. Afebrile - Advised mucinex twice daily - Doxycycline script provided to take if symptoms do not improve

## 2018-10-13 NOTE — Patient Instructions (Signed)
Taker mucinex twice daily with full glass of water  Restart Breo 1 puff daily (sample given and will send in RX)  If no improvement or you develop a fever go ahead and start taking antibiotic  Stay well hydrated  Follow-up: Return if no improvement or symptoms worsen Follow up in May/June with Dr. Lake Bells

## 2018-10-13 NOTE — Progress Notes (Signed)
@Patient  ID: Shane [name], male    DOB: 1934/04/04, 85 y.o.   MRN: [id number]  Chief Complaint  Patient presents with  . Acute Visit    cough with yellow, SOB x1 week    Referring provider: [name], MD  HPI: 85 year old male, former smoker quit in [place] (5 pack year hx). PMH significant for moderate persistent asthma, DM type 2 with renal complixations. Patient of Dr. Lake Bells last seen in December 2019. Treated for mild asthma exacerbation with prednisone x 5 days and started on Breo. Continues Xolair q3 weeks.   10/13/2018 Patient presents today for an acute visit with complaints of productive cough with yellow mucus x 1 week. Accompanied by his wife. Associated shortness of breath. He is unsure of wheezing. Using albuterol every 6hrs and feels it does help. Breo did help, used sample but never had prescription filled.   Patient receives dialysis three times a week, next visit is scheduled for tomorrow. Follows with Dr. Jimmy Footman with nephrology. He has a right arm fistula, reports some issues with flow and has scan next week. Last weight at dialysis was 95.5 kg; and 93.2 kg post. Reports that after dialysis he gets a headache and cramps. Typically he will go home, eat and lay down. He needed to sit up to breath recently.  States that yellow mustard helps his cramps, took a dose and was able to lie down and breath comfortably.   Allergies  Allergen Reactions  . Codeine Other (See Comments)    anxiety  . Hydrocodone Other (See Comments)    Anxiety- can take in liquid form  . Pseudoephedrine Other (See Comments)    Causes heart to race  . Azithromycin Rash    Immunization History  Administered Date(s) Administered  . Influenza Split 05/05/2013, 06/22/2015  . Influenza Whole 06/03/2011  . Influenza, High Dose Seasonal PF 04/08/2018  . Influenza,inj,quad, With Preservative 05/05/2017  . Influenza-Unspecified 06/01/2016, 06/05/2017  . Pneumococcal Conjugate-13 07/17/2015     Past Medical History:  Diagnosis Date  . Abnormal nuclear cardiac imaging test Nov 2010   moderate area of infarct in the inferior wall with only minimal reversibility and EF of 28%  . AICD (automatic cardioverter/defibrillator) present   . Allergic rhinitis 01-08-13   Uses nebulizer for chronic sinus issues and Mucinex.  . Arthritis    osteoarthritis   . Asthma    Extrinic  . Blood transfusion    ? at time of bypass surgery   . BPH (benign prostatic hyperplasia)   . Cellulitis of arm, left    MSSA  . CHF (congestive heart failure) ([place])   . CKD (chronic kidney disease), stage III ([place])    "was told due to meds he takes"-no Renal workup done  . Colon polyps    adenomatous  . Coronary artery disease    remote CABG in 1985, cath in 2003 by Dr. Lia Foyer with no PCI, last nuclear in 2010 showing scar and EF of 28%.   . Diabetes mellitus   . Diabetic neuropathy ([place]) 04/25/2017  . Dyslipidemia   . Dyspnea   . Gait abnormality 04/25/2017  . Glaucoma 01-08-13   tx. eye drops  . Hemodialysis patient ([place])   . HOH (hard of hearing)   . HTN (hypertension)   . Hypercholesterolemia   . Left ventricular dysfunction    28% per nuclear in 2010 and 35 to 40% per echo in 2010  . Myocardial infarction ([place])  1985  . Neuromuscular disorder ([place])    legs mild paralysis-able to walk"nerve damage"-legs- left leg brace   . Neuropathy   . Pneumonia    hx of several times years ago   . Spinal stenosis     Tobacco History: Social History   Tobacco Use  Smoking Status Former Smoker  . Packs/day: 1.00  . Years: 5.00  . Pack years: 5.00  . Types: Cigarettes, Pipe, Cigars  . Last attempt to quit: 08/05/1958  . Years since quitting: 60.2  Smokeless Tobacco Never Used   Counseling given: Not Answered   Outpatient Medications Prior to Visit  Medication Sig Dispense Refill  . Albuterol Sulfate (PROAIR RESPICLICK) 706 (90 Base) MCG/ACT AEPB Inhale 1-2 puffs into the lungs every 6 (six)  hours as needed. 1 each 5  . allopurinol (ZYLOPRIM) 100 MG tablet Take 1 tablet (100 mg total) by mouth daily. 90 tablet 1  . amiodarone (PACERONE) 200 MG tablet Take 1 tablet (200 mg total) by mouth daily. 90 tablet 2  . amLODipine (NORVASC) 5 MG tablet amlodipine 5 mg tablet    . ammonium lactate (AMLACTIN) 12 % lotion Apply 1 application topically 2 (two) times daily. 400 g 4  . aspirin EC 81 MG tablet Take 81 mg by mouth daily.     Marland Kitchen atorvastatin (LIPITOR) 10 MG tablet TAKE 1 TABLET DAILY 90 tablet 1  . B Complex-C-Folic Acid (RENAL-VITE) 0.8 MG TABS Rena-Vite 0.8 mg tablet    . calcitRIOL (ROCALTROL) 0.25 MCG capsule Take 1 capsule (0.25 mcg total) by mouth every Monday, Wednesday, and Friday with hemodialysis. 60 capsule 0  . cephALEXin (KEFLEX) 500 MG capsule cephalexin 500 mg capsule    . cetirizine (ZYRTEC) 10 MG tablet Take 10 mg by mouth at bedtime as needed (seasonal allergies).     Marland Kitchen dextromethorphan-guaiFENesin (MUCINEX DM) 30-600 MG 12hr tablet Take 1 tablet by mouth 2 (two) times daily as needed for cough. 20 tablet 0  . dicyclomine (BENTYL) 10 MG capsule TAKE 1 CAPSULE BY MOUTH THREE TIMES DAILY AS NEEDED USE SPARINGLY  3  . diphenhydrAMINE (BENADRYL) 12.5 MG/5ML elixir Take by mouth.    . fluocinonide cream (LIDEX) 2.37 % Apply 1 application topically 2 (two) times daily as needed (for itchy rash).     Marland Kitchen glipiZIDE (GLUCOTROL XL) 2.5 MG 24 hr tablet Take 2.5 mg by mouth daily.  6  . hydrALAZINE (APRESOLINE) 50 MG tablet hydralazine 50 mg tablet    . hydrocortisone ointment 0.5 % Apply 1 application topically 2 (two) times daily for 15 days. 30 g 0  . lidocaine-prilocaine (EMLA) cream Apply 1 application topically daily as needed for painful procedure/intervention.  11  . lidocaine-prilocaine (EMLA) cream lidocaine-prilocaine 2.5 %-2.5 % topical cream    . multivitamin (RENA-VIT) TABS tablet Take 1 tablet by mouth at bedtime. 60 tablet 0  . polyethylene glycol (MIRALAX /  GLYCOLAX) packet Take 17 g by mouth daily as needed for mild constipation. 14 each 0  . Polyethylene Glycol 3350 GRAN polyethylene glycol 3350 17 gram oral powder packet    . potassium chloride SA (KLOR-CON M20) 20 MEQ tablet Klor-Con M20 mEq tablet,extended release    . Syringe/Needle, Disp, (SYRINGE 3CC/22GX1") 22G X 1" 3 ML MISC Use to give injection    . tadalafil (CIALIS) 20 MG tablet Take 20 mg by mouth daily as needed for erectile dysfunction. Do not exceed 2 doses in 7 days    . testosterone cypionate (DEPOTESTOTERONE CYPIONATE) 100  MG/ML injection Inject 400 mg into the muscle every 21 ( twenty-one) days. For IM use only    . Testosterone Cypionate 100 MG/ML SOLN Inject into the muscle.    . Testosterone Cypionate 100 MG/ML SOLN Inject into the muscle.    . torsemide (DEMADEX) 20 MG tablet torsemide 20 mg tablet    . triamcinolone (NASACORT AQ) 55 MCG/ACT AERO nasal inhaler Place 2 sprays into the nose daily as needed (seasonal allergies).     . zolpidem (AMBIEN) 10 MG tablet TAKE 1/2 TABLET (5 MG) BY MOUTH AT BEDTIME, TAKE AN ADDITIONAL 1/2 TABLET (5 MG) IF NEEDED FOR SLEEP. 30 tablet 5  . doxycycline (VIBRA-TABS) 100 MG tablet doxycycline hyclate 100 mg tablet    . predniSONE (DELTASONE) 10 MG tablet prednisone 10 mg tablet    . predniSONE (DELTASONE) 20 MG tablet Take 1 tablet (20 mg total) by mouth daily with breakfast. 5 tablet 0  . fluticasone furoate-vilanterol (BREO ELLIPTA) 200-25 MCG/INH AEPB Inhale 1 puff into the lungs daily. (Patient not taking: Reported on 10/13/2018) 1 each 0   Facility-Administered Medications Prior to Visit  Medication Dose Route Frequency Provider Last Rate Last Dose  . omalizumab Arvid Right) injection 300 mg  300 mg Subcutaneous Q14 Days Juanito Doom, MD   300 mg at 08/11/18 1431    Review of Systems  Review of Systems  Constitutional: Negative.   HENT: Negative.   Respiratory: Positive for cough and shortness of breath. Negative for wheezing.    Cardiovascular: Negative.   Neurological: Positive for headaches.    Physical Exam  BP 132/70 (BP Location: Left Arm, Cuff Size: Normal)   Pulse 84   Temp 98 F (36.7 C)   Ht 5\' 10"  (1.778 m)   Wt 207 lb 9.6 oz (94.2 kg)   SpO2 95%   BMI 29.79 kg/m  Physical Exam Constitutional:      Appearance: Normal appearance.  HENT:     Head: Normocephalic and atraumatic.     Ears:     Comments: Hearing aids    Mouth/Throat:     Mouth: Mucous membranes are moist.     Pharynx: Oropharynx is clear.  Cardiovascular:     Rate and Rhythm: Normal rate and regular rhythm.  Pulmonary:     Effort: Pulmonary effort is normal.     Breath sounds: No wheezing or rhonchi.     Comments: CTA Skin:    General: Skin is warm and dry.     Comments: Right arm fistula  Neurological:     General: No focal deficit present.     Mental Status: He is alert and oriented to person, place, and time. Mental status is at baseline.  Psychiatric:        Mood and Affect: Mood normal.        Behavior: Behavior normal.        Thought Content: Thought content normal.        Judgment: Judgment normal.      Lab Results:  CBC    Component Value Date/Time   WBC 8.2 12/19/2017 0830   RBC 3.08 (L) 12/19/2017 0830   HGB 8.8 (L) 12/19/2017 0830   HGB 11.5 (L) 10/27/2017 1222   HCT 28.5 (L) 12/19/2017 0830   HCT 36.4 (L) 10/27/2017 1222   PLT 123 (L) 12/19/2017 0830   PLT 100 (LL) 10/27/2017 1222   MCV 92.5 12/19/2017 0830   MCV 95 10/27/2017 1222   MCH 28.6 12/19/2017 0830  MCHC 30.9 12/19/2017 0830   RDW 14.5 12/19/2017 0830   RDW 15.6 (H) 10/27/2017 1222   LYMPHSABS 1.2 12/16/2017 0133   LYMPHSABS 0.6 (L) 10/27/2017 1222   MONOABS 1.1 12/16/2017 0133   EOSABS 0.1 12/16/2017 0133   EOSABS 0.0 10/27/2017 1222   BASOSABS 0.0 12/16/2017 0133   BASOSABS 0.0 10/27/2017 1222    BMET    Component Value Date/Time   NA 135 12/19/2017 0830   NA 136 10/27/2017 1222   K 3.9 12/19/2017 0830   CL 97 (L)  12/19/2017 0830   CO2 25 12/19/2017 0830   GLUCOSE 163 (H) 12/19/2017 0830   BUN 77 (H) 12/19/2017 0830   BUN 109 (HH) 10/27/2017 1222   CREATININE 4.34 (H) 12/19/2017 0830   CREATININE 3.09 (H) 07/01/2016 0907   CALCIUM 8.5 (L) 12/19/2017 0830   GFRNONAA 12 (L) 12/19/2017 0830   GFRNONAA 21 (L) 02/15/2016 1607   GFRAA 13 (L) 12/19/2017 0830   GFRAA 24 (L) 02/15/2016 1607    BNP    Component Value Date/Time   BNP 681.1 (H) 12/16/2017 0133    ProBNP No results found for: PROBNP  Imaging: No results found.   Assessment & Plan:   Bronchitis - Productive cough x 1 week - Exam benign, lungs clear. VSS. Afebrile - Advised mucinex twice daily - Doxycycline script provided to take if symptoms do not improve   Asthma, moderate persistent - Does not appear exacerbated on exam - Patient noticed an improvement with previous sample of BREO - Resume Breo 200 one puff daily (sample given and RX sent)   Martyn Ehrich, NP 10/13/2018

## 2018-10-13 NOTE — Telephone Encounter (Signed)
Called and spoke with pt in regards to call received that Dr. Vaughan Browner took and checked to see if I could schedule him an OV. Pt expressed understanding and said that that was fine.   Acute visit has been scheduled for pt today at 12pm with [name]. Nothing further needed.

## 2018-10-14 ENCOUNTER — Telehealth: Payer: Self-pay

## 2018-10-14 DIAGNOSIS — N186 End stage renal disease: Secondary | ICD-10-CM | POA: Diagnosis not present

## 2018-10-14 DIAGNOSIS — D631 Anemia in chronic kidney disease: Secondary | ICD-10-CM | POA: Diagnosis not present

## 2018-10-14 DIAGNOSIS — E1129 Type 2 diabetes mellitus with other diabetic kidney complication: Secondary | ICD-10-CM | POA: Diagnosis not present

## 2018-10-14 DIAGNOSIS — Z23 Encounter for immunization: Secondary | ICD-10-CM | POA: Diagnosis not present

## 2018-10-14 DIAGNOSIS — D509 Iron deficiency anemia, unspecified: Secondary | ICD-10-CM | POA: Diagnosis not present

## 2018-10-14 DIAGNOSIS — N2581 Secondary hyperparathyroidism of renal origin: Secondary | ICD-10-CM | POA: Diagnosis not present

## 2018-10-14 NOTE — Telephone Encounter (Signed)
Patient called and asked if he could send remote transmission because he still feels Shane [name] of breath after having dialysis today.  He would like for me to review fluid levels.  Advised will review transmission and call him back.

## 2018-10-14 NOTE — Progress Notes (Signed)
Reviewed, agree 

## 2018-10-14 NOTE — Telephone Encounter (Signed)
Call to patient.  Advised transmission suggests a pattern that he has fluid accumulation every other day alternating with days at baseline.  He said the dialysis is working on getting the right dry weight for him.  Advised that the report today shows fluid.  He said he is short of breath.  He asked if I could send him a copy of the report to give to Dr Deterdings office and advised that I would fax a copy of the remote transmission to Dr Deterding's office for his review.  He appreciated me checking the fluid levels.  Patient currently not in ICM since is having dialysis to manage his fluid levels.  Contacted Dr Deterding's office, [facility], and fax number is [phone].  Message sent to Dr Deterding explaining report and why it was sent.   [employer] 10/14/2018

## 2018-10-15 DIAGNOSIS — Z992 Dependence on renal dialysis: Secondary | ICD-10-CM | POA: Diagnosis not present

## 2018-10-15 DIAGNOSIS — I871 Compression of vein: Secondary | ICD-10-CM | POA: Diagnosis not present

## 2018-10-15 DIAGNOSIS — N186 End stage renal disease: Secondary | ICD-10-CM | POA: Diagnosis not present

## 2018-10-15 DIAGNOSIS — T82858A Stenosis of vascular prosthetic devices, implants and grafts, initial encounter: Secondary | ICD-10-CM | POA: Diagnosis not present

## 2018-10-16 DIAGNOSIS — D631 Anemia in chronic kidney disease: Secondary | ICD-10-CM | POA: Diagnosis not present

## 2018-10-16 DIAGNOSIS — Z23 Encounter for immunization: Secondary | ICD-10-CM | POA: Diagnosis not present

## 2018-10-16 DIAGNOSIS — E1129 Type 2 diabetes mellitus with other diabetic kidney complication: Secondary | ICD-10-CM | POA: Diagnosis not present

## 2018-10-16 DIAGNOSIS — D509 Iron deficiency anemia, unspecified: Secondary | ICD-10-CM | POA: Diagnosis not present

## 2018-10-16 DIAGNOSIS — N186 End stage renal disease: Secondary | ICD-10-CM | POA: Diagnosis not present

## 2018-10-16 DIAGNOSIS — N2581 Secondary hyperparathyroidism of renal origin: Secondary | ICD-10-CM | POA: Diagnosis not present

## 2018-10-18 ENCOUNTER — Encounter: Payer: Self-pay | Admitting: Podiatry

## 2018-10-18 NOTE — Progress Notes (Addendum)
Subjective: Shane [name] is a 85 y.o. y.o. male who presents for preventative foot care today with h/o diabetic neuropathy and  PAD and cc of painful, discolored, thick toenails and painful callus/corn which interfere with daily activities. Pain is aggravated when wearing enclosed shoe gear. Pain is relieved with periodic professional debridement.  [name], MD is his PCP.  Last visit was 05/26/2018.   Allergies  Allergen Reactions  . Codeine Other (See Comments)    anxiety  . Hydrocodone Other (See Comments)    Anxiety- can take in liquid form  . Pseudoephedrine Other (See Comments)    Causes heart to race  . Azithromycin Rash    Objective: Vascular Examination: Capillary refill time immediate x 10 digits  Dorsalis pedis pulses 1/4 b/l.  Posterior tibial pulses absent b/l.  No digital hair x 10 digits.  Skin temperature WNL b/l.  Dermatological Examination: Skin age related atrophy b/l.  Toenails 1-5 b/l discolored, thick, dystrophic with subungual debris and pain with palpation to nailbeds due to thickness of nails.  Superficial skin eruption noted dorsal aspect left foot. No edema, no drainage, no purulence.  Dry, flaky skin b/l LE  Musculoskeletal: Muscle strength 5/5 to all LE muscle groups.  Neurological: Sensation intact with 10 gram monofilament b/l.  Vibratory sensation intact b/l.  Assessment: 1. Painful onychomycosis toenails 1-5 b/l 2. Rash left foot 3. Xerosis 4. NIDDM with PAD;  Diabetic neuropathy  Plan: 1. Continue diabetic foot care principles. Literature dispensed. For xerosis, prescription for AmLactin lotion 12% was written.  Patient is to apply to both feet twice a day avoiding application in between the toes. Toenails 1-5 b/l were debrided in length and girth without iatrogenic bleeding. For rash, rx sent for hydrocortisone ointment 0.5% to be applied to left foot twice daily for 15 days. Hyperkeratotic lesion(s)  pared with  sterile chisel blade and gently smoothed with burr. Patient to continue soft, supportive shoe gear. Patient to report any pedal injuries to medical professional. Follow up 10 weeks. Patient/POA to call should there be a concern in the interim.

## 2018-10-19 DIAGNOSIS — D631 Anemia in chronic kidney disease: Secondary | ICD-10-CM | POA: Diagnosis not present

## 2018-10-19 DIAGNOSIS — E1129 Type 2 diabetes mellitus with other diabetic kidney complication: Secondary | ICD-10-CM | POA: Diagnosis not present

## 2018-10-19 DIAGNOSIS — N2581 Secondary hyperparathyroidism of renal origin: Secondary | ICD-10-CM | POA: Diagnosis not present

## 2018-10-19 DIAGNOSIS — Z23 Encounter for immunization: Secondary | ICD-10-CM | POA: Diagnosis not present

## 2018-10-19 DIAGNOSIS — N186 End stage renal disease: Secondary | ICD-10-CM | POA: Diagnosis not present

## 2018-10-19 DIAGNOSIS — D509 Iron deficiency anemia, unspecified: Secondary | ICD-10-CM | POA: Diagnosis not present

## 2018-10-20 ENCOUNTER — Other Ambulatory Visit: Payer: Self-pay

## 2018-10-20 ENCOUNTER — Ambulatory Visit (INDEPENDENT_AMBULATORY_CARE_PROVIDER_SITE_OTHER): Payer: Medicare Other

## 2018-10-20 DIAGNOSIS — E291 Testicular hypofunction: Secondary | ICD-10-CM | POA: Diagnosis not present

## 2018-10-20 DIAGNOSIS — J4540 Moderate persistent asthma, uncomplicated: Secondary | ICD-10-CM | POA: Diagnosis not present

## 2018-10-20 MED ORDER — OMALIZUMAB 150 MG ~~LOC~~ SOLR
300.0000 mg | Freq: Once | SUBCUTANEOUS | Status: AC
  Administered 2018-10-20: 300 mg via SUBCUTANEOUS

## 2018-10-21 DIAGNOSIS — N186 End stage renal disease: Secondary | ICD-10-CM | POA: Diagnosis not present

## 2018-10-21 DIAGNOSIS — D631 Anemia in chronic kidney disease: Secondary | ICD-10-CM | POA: Diagnosis not present

## 2018-10-21 DIAGNOSIS — N2581 Secondary hyperparathyroidism of renal origin: Secondary | ICD-10-CM | POA: Diagnosis not present

## 2018-10-21 DIAGNOSIS — D509 Iron deficiency anemia, unspecified: Secondary | ICD-10-CM | POA: Diagnosis not present

## 2018-10-21 DIAGNOSIS — Z23 Encounter for immunization: Secondary | ICD-10-CM | POA: Diagnosis not present

## 2018-10-21 DIAGNOSIS — E1129 Type 2 diabetes mellitus with other diabetic kidney complication: Secondary | ICD-10-CM | POA: Diagnosis not present

## 2018-10-21 MED ORDER — OMALIZUMAB 150 MG ~~LOC~~ SOLR: 300.0000 mg | Freq: Once | SUBCUTANEOUS | Status: DC

## 2018-10-23 DIAGNOSIS — D631 Anemia in chronic kidney disease: Secondary | ICD-10-CM | POA: Diagnosis not present

## 2018-10-23 DIAGNOSIS — N2581 Secondary hyperparathyroidism of renal origin: Secondary | ICD-10-CM | POA: Diagnosis not present

## 2018-10-23 DIAGNOSIS — N186 End stage renal disease: Secondary | ICD-10-CM | POA: Diagnosis not present

## 2018-10-23 DIAGNOSIS — Z23 Encounter for immunization: Secondary | ICD-10-CM | POA: Diagnosis not present

## 2018-10-23 DIAGNOSIS — D509 Iron deficiency anemia, unspecified: Secondary | ICD-10-CM | POA: Diagnosis not present

## 2018-10-23 DIAGNOSIS — E1129 Type 2 diabetes mellitus with other diabetic kidney complication: Secondary | ICD-10-CM | POA: Diagnosis not present

## 2018-10-26 ENCOUNTER — Telehealth: Payer: Self-pay | Admitting: Pulmonary Disease

## 2018-10-26 DIAGNOSIS — N2581 Secondary hyperparathyroidism of renal origin: Secondary | ICD-10-CM | POA: Diagnosis not present

## 2018-10-26 DIAGNOSIS — N186 End stage renal disease: Secondary | ICD-10-CM | POA: Diagnosis not present

## 2018-10-26 DIAGNOSIS — E1129 Type 2 diabetes mellitus with other diabetic kidney complication: Secondary | ICD-10-CM | POA: Diagnosis not present

## 2018-10-26 DIAGNOSIS — D509 Iron deficiency anemia, unspecified: Secondary | ICD-10-CM | POA: Diagnosis not present

## 2018-10-26 DIAGNOSIS — D631 Anemia in chronic kidney disease: Secondary | ICD-10-CM | POA: Diagnosis not present

## 2018-10-26 DIAGNOSIS — Z23 Encounter for immunization: Secondary | ICD-10-CM | POA: Diagnosis not present

## 2018-10-27 NOTE — Telephone Encounter (Signed)
Prefilled Syringe: #150mg  4  #75mg  0 Ordered Date: 10/26/2018 Shipping Date: 10/26/2018

## 2018-10-27 NOTE — Telephone Encounter (Signed)
Prefilled Syringes: # 150mg  4  #75mg  0 Arrival Date: 10/27/2018 Lot #: 150mg  [id number]      75mg  0 Exp Date: 150mg  09/2019   75mg  0

## 2018-10-28 DIAGNOSIS — N2581 Secondary hyperparathyroidism of renal origin: Secondary | ICD-10-CM | POA: Diagnosis not present

## 2018-10-28 DIAGNOSIS — D509 Iron deficiency anemia, unspecified: Secondary | ICD-10-CM | POA: Diagnosis not present

## 2018-10-28 DIAGNOSIS — E1129 Type 2 diabetes mellitus with other diabetic kidney complication: Secondary | ICD-10-CM | POA: Diagnosis not present

## 2018-10-28 DIAGNOSIS — D631 Anemia in chronic kidney disease: Secondary | ICD-10-CM | POA: Diagnosis not present

## 2018-10-28 DIAGNOSIS — Z23 Encounter for immunization: Secondary | ICD-10-CM | POA: Diagnosis not present

## 2018-10-28 DIAGNOSIS — N186 End stage renal disease: Secondary | ICD-10-CM | POA: Diagnosis not present

## 2018-10-30 DIAGNOSIS — N186 End stage renal disease: Secondary | ICD-10-CM | POA: Diagnosis not present

## 2018-10-30 DIAGNOSIS — N2581 Secondary hyperparathyroidism of renal origin: Secondary | ICD-10-CM | POA: Diagnosis not present

## 2018-10-30 DIAGNOSIS — D509 Iron deficiency anemia, unspecified: Secondary | ICD-10-CM | POA: Diagnosis not present

## 2018-10-30 DIAGNOSIS — E1129 Type 2 diabetes mellitus with other diabetic kidney complication: Secondary | ICD-10-CM | POA: Diagnosis not present

## 2018-10-30 DIAGNOSIS — D631 Anemia in chronic kidney disease: Secondary | ICD-10-CM | POA: Diagnosis not present

## 2018-10-30 DIAGNOSIS — Z23 Encounter for immunization: Secondary | ICD-10-CM | POA: Diagnosis not present

## 2018-11-02 DIAGNOSIS — D631 Anemia in chronic kidney disease: Secondary | ICD-10-CM | POA: Diagnosis not present

## 2018-11-02 DIAGNOSIS — D509 Iron deficiency anemia, unspecified: Secondary | ICD-10-CM | POA: Diagnosis not present

## 2018-11-02 DIAGNOSIS — E1129 Type 2 diabetes mellitus with other diabetic kidney complication: Secondary | ICD-10-CM | POA: Diagnosis not present

## 2018-11-02 DIAGNOSIS — N2581 Secondary hyperparathyroidism of renal origin: Secondary | ICD-10-CM | POA: Diagnosis not present

## 2018-11-02 DIAGNOSIS — Z23 Encounter for immunization: Secondary | ICD-10-CM | POA: Diagnosis not present

## 2018-11-02 DIAGNOSIS — N186 End stage renal disease: Secondary | ICD-10-CM | POA: Diagnosis not present

## 2018-11-04 DIAGNOSIS — N2581 Secondary hyperparathyroidism of renal origin: Secondary | ICD-10-CM | POA: Diagnosis not present

## 2018-11-04 DIAGNOSIS — I129 Hypertensive chronic kidney disease with stage 1 through stage 4 chronic kidney disease, or unspecified chronic kidney disease: Secondary | ICD-10-CM | POA: Diagnosis not present

## 2018-11-04 DIAGNOSIS — Z992 Dependence on renal dialysis: Secondary | ICD-10-CM | POA: Diagnosis not present

## 2018-11-04 DIAGNOSIS — N186 End stage renal disease: Secondary | ICD-10-CM | POA: Diagnosis not present

## 2018-11-04 DIAGNOSIS — D631 Anemia in chronic kidney disease: Secondary | ICD-10-CM | POA: Diagnosis not present

## 2018-11-04 DIAGNOSIS — Z23 Encounter for immunization: Secondary | ICD-10-CM | POA: Diagnosis not present

## 2018-11-04 DIAGNOSIS — E1129 Type 2 diabetes mellitus with other diabetic kidney complication: Secondary | ICD-10-CM | POA: Diagnosis not present

## 2018-11-04 DIAGNOSIS — D509 Iron deficiency anemia, unspecified: Secondary | ICD-10-CM | POA: Diagnosis not present

## 2018-11-06 DIAGNOSIS — E1129 Type 2 diabetes mellitus with other diabetic kidney complication: Secondary | ICD-10-CM | POA: Diagnosis not present

## 2018-11-06 DIAGNOSIS — D631 Anemia in chronic kidney disease: Secondary | ICD-10-CM | POA: Diagnosis not present

## 2018-11-06 DIAGNOSIS — N2581 Secondary hyperparathyroidism of renal origin: Secondary | ICD-10-CM | POA: Diagnosis not present

## 2018-11-06 DIAGNOSIS — N186 End stage renal disease: Secondary | ICD-10-CM | POA: Diagnosis not present

## 2018-11-06 DIAGNOSIS — Z23 Encounter for immunization: Secondary | ICD-10-CM | POA: Diagnosis not present

## 2018-11-06 DIAGNOSIS — D509 Iron deficiency anemia, unspecified: Secondary | ICD-10-CM | POA: Diagnosis not present

## 2018-11-09 DIAGNOSIS — N186 End stage renal disease: Secondary | ICD-10-CM | POA: Diagnosis not present

## 2018-11-09 DIAGNOSIS — D509 Iron deficiency anemia, unspecified: Secondary | ICD-10-CM | POA: Diagnosis not present

## 2018-11-09 DIAGNOSIS — D631 Anemia in chronic kidney disease: Secondary | ICD-10-CM | POA: Diagnosis not present

## 2018-11-09 DIAGNOSIS — Z23 Encounter for immunization: Secondary | ICD-10-CM | POA: Diagnosis not present

## 2018-11-09 DIAGNOSIS — E1129 Type 2 diabetes mellitus with other diabetic kidney complication: Secondary | ICD-10-CM | POA: Diagnosis not present

## 2018-11-09 DIAGNOSIS — N2581 Secondary hyperparathyroidism of renal origin: Secondary | ICD-10-CM | POA: Diagnosis not present

## 2018-11-10 ENCOUNTER — Ambulatory Visit: Payer: Medicare Other

## 2018-11-11 ENCOUNTER — Telehealth: Payer: Self-pay | Admitting: Internal Medicine

## 2018-11-11 ENCOUNTER — Other Ambulatory Visit: Payer: Self-pay | Admitting: Internal Medicine

## 2018-11-11 DIAGNOSIS — E1129 Type 2 diabetes mellitus with other diabetic kidney complication: Secondary | ICD-10-CM | POA: Diagnosis not present

## 2018-11-11 DIAGNOSIS — D509 Iron deficiency anemia, unspecified: Secondary | ICD-10-CM | POA: Diagnosis not present

## 2018-11-11 DIAGNOSIS — F5101 Primary insomnia: Secondary | ICD-10-CM

## 2018-11-11 DIAGNOSIS — N186 End stage renal disease: Secondary | ICD-10-CM | POA: Diagnosis not present

## 2018-11-11 DIAGNOSIS — N2581 Secondary hyperparathyroidism of renal origin: Secondary | ICD-10-CM | POA: Diagnosis not present

## 2018-11-11 DIAGNOSIS — Z23 Encounter for immunization: Secondary | ICD-10-CM | POA: Diagnosis not present

## 2018-11-11 DIAGNOSIS — D631 Anemia in chronic kidney disease: Secondary | ICD-10-CM | POA: Diagnosis not present

## 2018-11-11 NOTE — Telephone Encounter (Signed)
Transmission from 11/10/18 at 2022 reviewed. No episodes. Appears device function stable, RV threshold chronically elevated, R-wave amplitude decreased as of 07/2018, though trend stable since then. Not PPM dependent per notes. Thoracic impedance suggests fluid accumulation--patient reports he is receiving dialysis.  Patient described his falls yesterday--legs suddenly buckled. No dizziness or syncope. No injuries. He isn't sure what caused his sudden weakness. Reports he has also been having trouble controlling his CBG, didn't check it last night after the falls, but reports it was 266 this AM. Advised to contact PCP regarding falls and weakness. Will also route this message to Dr. Caryl Comes as Juluis Rainier. Pt verbalizes understanding and thanked me for my call.

## 2018-11-11 NOTE — Telephone Encounter (Signed)
 Controlled Database Checked Last filled: 10/13/18 # 30 LOV w/you: 05/26/18 Next appt w/you: 11/24/18

## 2018-11-11 NOTE — Telephone Encounter (Signed)
Per pt call - stated he fell twice last night and sent a transmithon in.  He would like a call back on this repost to see if this had anything with him falling.   Please give him a call back.

## 2018-11-13 DIAGNOSIS — N186 End stage renal disease: Secondary | ICD-10-CM | POA: Diagnosis not present

## 2018-11-13 DIAGNOSIS — D509 Iron deficiency anemia, unspecified: Secondary | ICD-10-CM | POA: Diagnosis not present

## 2018-11-13 DIAGNOSIS — N2581 Secondary hyperparathyroidism of renal origin: Secondary | ICD-10-CM | POA: Diagnosis not present

## 2018-11-13 DIAGNOSIS — D631 Anemia in chronic kidney disease: Secondary | ICD-10-CM | POA: Diagnosis not present

## 2018-11-13 DIAGNOSIS — E1129 Type 2 diabetes mellitus with other diabetic kidney complication: Secondary | ICD-10-CM | POA: Diagnosis not present

## 2018-11-13 DIAGNOSIS — Z23 Encounter for immunization: Secondary | ICD-10-CM | POA: Diagnosis not present

## 2018-11-16 ENCOUNTER — Telehealth: Payer: Self-pay | Admitting: Internal Medicine

## 2018-11-16 DIAGNOSIS — N2581 Secondary hyperparathyroidism of renal origin: Secondary | ICD-10-CM | POA: Diagnosis not present

## 2018-11-16 DIAGNOSIS — Z23 Encounter for immunization: Secondary | ICD-10-CM | POA: Diagnosis not present

## 2018-11-16 DIAGNOSIS — D509 Iron deficiency anemia, unspecified: Secondary | ICD-10-CM | POA: Diagnosis not present

## 2018-11-16 DIAGNOSIS — D631 Anemia in chronic kidney disease: Secondary | ICD-10-CM | POA: Diagnosis not present

## 2018-11-16 DIAGNOSIS — E1129 Type 2 diabetes mellitus with other diabetic kidney complication: Secondary | ICD-10-CM | POA: Diagnosis not present

## 2018-11-16 DIAGNOSIS — N186 End stage renal disease: Secondary | ICD-10-CM | POA: Diagnosis not present

## 2018-11-16 NOTE — Telephone Encounter (Signed)
L  Could you ask him Plz if he has the ability to measure his BP sitting and standing for [place]  Thanks  steve

## 2018-11-16 NOTE — Telephone Encounter (Signed)
Follow Up:    Pt called back with his blood pressure readings.  Sitting Down -134/62 Standing Up- 143/65

## 2018-11-16 NOTE — Telephone Encounter (Signed)
Per Dr Caryl Comes, pt's vitals are stable and do not show signs of orthostatic intolerance. He recommends pt see his PCP regarding leg weakness.

## 2018-11-16 NOTE — Telephone Encounter (Signed)
Pt has agreed to taking sitting and standing BP's when he returns home. He will call back with the results.

## 2018-11-17 ENCOUNTER — Ambulatory Visit (INDEPENDENT_AMBULATORY_CARE_PROVIDER_SITE_OTHER): Payer: Medicare Other

## 2018-11-17 ENCOUNTER — Other Ambulatory Visit: Payer: Self-pay

## 2018-11-17 DIAGNOSIS — J4540 Moderate persistent asthma, uncomplicated: Secondary | ICD-10-CM | POA: Diagnosis not present

## 2018-11-17 MED ORDER — OMALIZUMAB 150 MG ~~LOC~~ SOLR
300.0000 mg | Freq: Once | SUBCUTANEOUS | Status: AC
  Administered 2018-11-17: 300 mg via SUBCUTANEOUS

## 2018-11-17 NOTE — Progress Notes (Signed)
Have you been hospitalized within the last 10 days?  No Do you have a fever?  No Do you have a cough?  Yes Very little C from having bronch ist Saw BW took a course of abx. Marion asked BW if pt could get his Xolair inj.. Pt had No fever or SOB. Per BW okay to give. Do you have a headache or sore throat? No

## 2018-11-18 DIAGNOSIS — D631 Anemia in chronic kidney disease: Secondary | ICD-10-CM | POA: Diagnosis not present

## 2018-11-18 DIAGNOSIS — D509 Iron deficiency anemia, unspecified: Secondary | ICD-10-CM | POA: Diagnosis not present

## 2018-11-18 DIAGNOSIS — Z23 Encounter for immunization: Secondary | ICD-10-CM | POA: Diagnosis not present

## 2018-11-18 DIAGNOSIS — E1129 Type 2 diabetes mellitus with other diabetic kidney complication: Secondary | ICD-10-CM | POA: Diagnosis not present

## 2018-11-18 DIAGNOSIS — N2581 Secondary hyperparathyroidism of renal origin: Secondary | ICD-10-CM | POA: Diagnosis not present

## 2018-11-18 DIAGNOSIS — N186 End stage renal disease: Secondary | ICD-10-CM | POA: Diagnosis not present

## 2018-11-20 DIAGNOSIS — N186 End stage renal disease: Secondary | ICD-10-CM | POA: Diagnosis not present

## 2018-11-20 DIAGNOSIS — E1129 Type 2 diabetes mellitus with other diabetic kidney complication: Secondary | ICD-10-CM | POA: Diagnosis not present

## 2018-11-20 DIAGNOSIS — D631 Anemia in chronic kidney disease: Secondary | ICD-10-CM | POA: Diagnosis not present

## 2018-11-20 DIAGNOSIS — Z23 Encounter for immunization: Secondary | ICD-10-CM | POA: Diagnosis not present

## 2018-11-20 DIAGNOSIS — D509 Iron deficiency anemia, unspecified: Secondary | ICD-10-CM | POA: Diagnosis not present

## 2018-11-20 DIAGNOSIS — N2581 Secondary hyperparathyroidism of renal origin: Secondary | ICD-10-CM | POA: Diagnosis not present

## 2018-11-23 DIAGNOSIS — N2581 Secondary hyperparathyroidism of renal origin: Secondary | ICD-10-CM | POA: Diagnosis not present

## 2018-11-23 DIAGNOSIS — Z23 Encounter for immunization: Secondary | ICD-10-CM | POA: Diagnosis not present

## 2018-11-23 DIAGNOSIS — N186 End stage renal disease: Secondary | ICD-10-CM | POA: Diagnosis not present

## 2018-11-23 DIAGNOSIS — D631 Anemia in chronic kidney disease: Secondary | ICD-10-CM | POA: Diagnosis not present

## 2018-11-23 DIAGNOSIS — E1129 Type 2 diabetes mellitus with other diabetic kidney complication: Secondary | ICD-10-CM | POA: Diagnosis not present

## 2018-11-23 DIAGNOSIS — D509 Iron deficiency anemia, unspecified: Secondary | ICD-10-CM | POA: Diagnosis not present

## 2018-11-24 ENCOUNTER — Ambulatory Visit: Payer: Medicare Other | Admitting: Internal Medicine

## 2018-11-25 DIAGNOSIS — E1129 Type 2 diabetes mellitus with other diabetic kidney complication: Secondary | ICD-10-CM | POA: Diagnosis not present

## 2018-11-25 DIAGNOSIS — D509 Iron deficiency anemia, unspecified: Secondary | ICD-10-CM | POA: Diagnosis not present

## 2018-11-25 DIAGNOSIS — N2581 Secondary hyperparathyroidism of renal origin: Secondary | ICD-10-CM | POA: Diagnosis not present

## 2018-11-25 DIAGNOSIS — N186 End stage renal disease: Secondary | ICD-10-CM | POA: Diagnosis not present

## 2018-11-25 DIAGNOSIS — D631 Anemia in chronic kidney disease: Secondary | ICD-10-CM | POA: Diagnosis not present

## 2018-11-25 DIAGNOSIS — Z23 Encounter for immunization: Secondary | ICD-10-CM | POA: Diagnosis not present

## 2018-11-26 ENCOUNTER — Ambulatory Visit (INDEPENDENT_AMBULATORY_CARE_PROVIDER_SITE_OTHER): Payer: Medicare Other | Admitting: *Deleted

## 2018-11-26 ENCOUNTER — Other Ambulatory Visit: Payer: Self-pay

## 2018-11-26 DIAGNOSIS — I255 Ischemic cardiomyopathy: Secondary | ICD-10-CM

## 2018-11-26 DIAGNOSIS — I5022 Chronic systolic (congestive) heart failure: Secondary | ICD-10-CM

## 2018-11-27 ENCOUNTER — Telehealth: Payer: Self-pay | Admitting: Pulmonary Disease

## 2018-11-27 DIAGNOSIS — N186 End stage renal disease: Secondary | ICD-10-CM | POA: Diagnosis not present

## 2018-11-27 DIAGNOSIS — E1129 Type 2 diabetes mellitus with other diabetic kidney complication: Secondary | ICD-10-CM | POA: Diagnosis not present

## 2018-11-27 DIAGNOSIS — D509 Iron deficiency anemia, unspecified: Secondary | ICD-10-CM | POA: Diagnosis not present

## 2018-11-27 DIAGNOSIS — D631 Anemia in chronic kidney disease: Secondary | ICD-10-CM | POA: Diagnosis not present

## 2018-11-27 DIAGNOSIS — Z23 Encounter for immunization: Secondary | ICD-10-CM | POA: Diagnosis not present

## 2018-11-27 DIAGNOSIS — N2581 Secondary hyperparathyroidism of renal origin: Secondary | ICD-10-CM | POA: Diagnosis not present

## 2018-11-27 LAB — CUP PACEART REMOTE DEVICE CHECK
Battery Remaining Longevity: 27 mo
Battery Voltage: 2.91 V
Brady Statistic AP VP Percent: 0.07 %
Brady Statistic AP VS Percent: 0.01 %
Brady Statistic AS VP Percent: 97.64 %
Brady Statistic AS VS Percent: 2.28 %
Brady Statistic RA Percent Paced: 0.09 %
Brady Statistic RV Percent Paced: 0.25 %
Date Time Interrogation Session: 20200424022504
HighPow Impedance: 53 Ohm
Implantable Lead Implant Date: 20160308
Implantable Lead Implant Date: 20160308
Implantable Lead Implant Date: 20160308
Implantable Lead Location: 753858
Implantable Lead Location: 753859
Implantable Lead Location: 753860
Implantable Lead Model: 4598
Implantable Lead Model: 5076
Implantable Lead Model: 6935
Implantable Pulse Generator Implant Date: 20160308
Lead Channel Impedance Value: 285 Ohm
Lead Channel Impedance Value: 323 Ohm
Lead Channel Impedance Value: 342 Ohm
Lead Channel Impedance Value: 342 Ohm
Lead Channel Impedance Value: 380 Ohm
Lead Channel Impedance Value: 399 Ohm
Lead Channel Impedance Value: 399 Ohm
Lead Channel Impedance Value: 437 Ohm
Lead Channel Impedance Value: 551 Ohm
Lead Channel Impedance Value: 551 Ohm
Lead Channel Impedance Value: 646 Ohm
Lead Channel Impedance Value: 665 Ohm
Lead Channel Impedance Value: 665 Ohm
Lead Channel Pacing Threshold Amplitude: 0.75 V
Lead Channel Pacing Threshold Amplitude: 1 V
Lead Channel Pacing Threshold Amplitude: 2.375 V
Lead Channel Pacing Threshold Pulse Width: 0.4 ms
Lead Channel Pacing Threshold Pulse Width: 0.4 ms
Lead Channel Pacing Threshold Pulse Width: 0.6 ms
Lead Channel Sensing Intrinsic Amplitude: 2 mV
Lead Channel Sensing Intrinsic Amplitude: 2 mV
Lead Channel Sensing Intrinsic Amplitude: 3.25 mV
Lead Channel Sensing Intrinsic Amplitude: 3.25 mV
Lead Channel Setting Pacing Amplitude: 2 V
Lead Channel Setting Pacing Amplitude: 2 V
Lead Channel Setting Pacing Amplitude: 4 V
Lead Channel Setting Pacing Pulse Width: 0.6 ms
Lead Channel Setting Pacing Pulse Width: 0.8 ms
Lead Channel Setting Sensing Sensitivity: 0.3 mV

## 2018-11-27 NOTE — Telephone Encounter (Signed)
Please forward to [facility] since she recently saw the patient. Thanks.

## 2018-11-27 NOTE — Telephone Encounter (Signed)
Per TN, forwarding sick message to BW.  Primary Pulmonologist: Dr. Simonne Maffucci Last office visit and with whom: 10/13/2018 w/ BW. What do we see them for (pulmonary problems): Moderate persistent asthma  Reason for call: Pt states he's having SOB w/ exertion, mild cough & mild wheeze from asthma for 2-3 weeks now. Was treated for bronchitis by BW w/ 10 course of antibiotics starting 10/13/2018. States he feels better, however, still increasingly SOB. Denies fever/chills/sweats. Denies chest pain/tightness. Takes albuterol inh 2 puffs 2-3 times daily and Breo 1 puff daily. States albuterol is therapeutic but he is not sure if the [place] helps. He also added he has a "bad left ankle" & "worn out knee" and cannot walk very fair because of the pain.   In the last month, have you been in contact with someone who was confirmed or suspected to have Conoravirus / COVID-19?  No  Do you have any of the following symptoms developed in the last 30 days? Fever: No Cough: Yes, mild. Shortness of breath: Yes  When did your symptoms start?  2-3 weeks ago.  If the patient has a fever, what is the last reading?  (use n/a if patient denies fever)  N/A  IF THE PATIENT STATES THEY DO NOT OWN A THERMOMETER, THEY MUST GO AND PURCHASE ONE When did the fever start?: N/A Have you taken any medication to suppress a fever (ie Ibuprofen, Aleve, Tylenol)?: N/A  BW, please advise on your recommendations for this pt. Thank you.

## 2018-11-27 NOTE — Telephone Encounter (Signed)
Primary Pulmonologist: Dr. Simonne Maffucci Last office visit and with whom: 10/13/2018 w/ BW. What do we see them for (pulmonary problems): Moderate persistent asthma  Reason for call: Pt states he's having SOB w/ exertion, mild cough & mild wheeze from asthma for 2-3 weeks now. Was treated for bronchitis by BW w/ 10 course of antibiotics starting 10/13/2018. States he feels better, however, still increasingly SOB. Denies fever/chills/sweats. Denies chest pain/tightness. Takes albuterol inh 2 puffs 2-3 times daily and Breo 1 puff daily. States albuterol is therapeutic but he is not sure if the [place] helps. He also added he has a "bad left ankle" & "worn out knee" and cannot walk very fair because of the pain.   In the last month, have you been in contact with someone who was confirmed or suspected to have Conoravirus / COVID-19?  No  Do you have any of the following symptoms developed in the last 30 days? Fever: No Cough: Yes, mild. Shortness of breath: Yes  When did your symptoms start?  2-3 weeks ago.  If the patient has a fever, what is the last reading?  (use n/a if patient denies fever)  N/A . IF THE PATIENT STATES THEY DO NOT OWN A THERMOMETER, THEY MUST GO AND PURCHASE ONE When did the fever start?: N/A Have you taken any medication to suppress a fever (ie Ibuprofen, Aleve, Tylenol)?: N/A  TN, please advise on your recommendations for this pt. Thank you.

## 2018-11-27 NOTE — Telephone Encounter (Signed)
Increase Breo to 200. Please give patient a sample

## 2018-11-30 ENCOUNTER — Telehealth: Payer: Self-pay | Admitting: Pulmonary Disease

## 2018-11-30 DIAGNOSIS — Z23 Encounter for immunization: Secondary | ICD-10-CM | POA: Diagnosis not present

## 2018-11-30 DIAGNOSIS — D509 Iron deficiency anemia, unspecified: Secondary | ICD-10-CM | POA: Diagnosis not present

## 2018-11-30 DIAGNOSIS — E1129 Type 2 diabetes mellitus with other diabetic kidney complication: Secondary | ICD-10-CM | POA: Diagnosis not present

## 2018-11-30 DIAGNOSIS — N186 End stage renal disease: Secondary | ICD-10-CM | POA: Diagnosis not present

## 2018-11-30 DIAGNOSIS — N2581 Secondary hyperparathyroidism of renal origin: Secondary | ICD-10-CM | POA: Diagnosis not present

## 2018-11-30 DIAGNOSIS — D631 Anemia in chronic kidney disease: Secondary | ICD-10-CM | POA: Diagnosis not present

## 2018-11-30 MED ORDER — FLUTICASONE FUROATE-VILANTEROL 200-25 MCG/INH IN AEPB: 1.0000 | INHALATION_SPRAY | Freq: Every day | RESPIRATORY_TRACT | 0 refills | Status: DC

## 2018-11-30 MED ORDER — FLUTICASONE FUROATE-VILANTEROL 200-25 MCG/INH IN AEPB: 1.0000 | INHALATION_SPRAY | Freq: Every day | RESPIRATORY_TRACT | 5 refills | Status: DC

## 2018-11-30 NOTE — Telephone Encounter (Signed)
Shane Ehrich, NP  to Shane Stabs, RN       11/27/18 11:04 AM  Note    Increase Breo to 200. Please give patient a sample     Called and spoke with pt letting him know that Shane [name] wanted him to switch from Breo 100 to Breo 200 to see if this would help with his symptoms. Stated to pt that we were going to provide him a sample per [place]. Pt expressed understanding and asked if it was the same instructions as 1 puff daily as he had been doing with Breo 100 and I stated to pt that was correct. Sample has been placed up front for pt. I have also sent a Rx of Breo 200 to pt's pharmacy. Nothing further needed.

## 2018-12-01 NOTE — Telephone Encounter (Signed)
Breo sent in.  Nothing further is needed.

## 2018-12-02 DIAGNOSIS — D631 Anemia in chronic kidney disease: Secondary | ICD-10-CM | POA: Diagnosis not present

## 2018-12-02 DIAGNOSIS — Z23 Encounter for immunization: Secondary | ICD-10-CM | POA: Diagnosis not present

## 2018-12-02 DIAGNOSIS — D509 Iron deficiency anemia, unspecified: Secondary | ICD-10-CM | POA: Diagnosis not present

## 2018-12-02 DIAGNOSIS — N2581 Secondary hyperparathyroidism of renal origin: Secondary | ICD-10-CM | POA: Diagnosis not present

## 2018-12-02 DIAGNOSIS — E1129 Type 2 diabetes mellitus with other diabetic kidney complication: Secondary | ICD-10-CM | POA: Diagnosis not present

## 2018-12-02 DIAGNOSIS — N186 End stage renal disease: Secondary | ICD-10-CM | POA: Diagnosis not present

## 2018-12-03 ENCOUNTER — Encounter: Payer: Self-pay | Admitting: Pulmonary Disease

## 2018-12-03 ENCOUNTER — Ambulatory Visit (INDEPENDENT_AMBULATORY_CARE_PROVIDER_SITE_OTHER): Payer: Medicare Other | Admitting: Pulmonary Disease

## 2018-12-03 ENCOUNTER — Other Ambulatory Visit: Payer: Self-pay

## 2018-12-03 ENCOUNTER — Ambulatory Visit (INDEPENDENT_AMBULATORY_CARE_PROVIDER_SITE_OTHER): Payer: Medicare Other

## 2018-12-03 ENCOUNTER — Telehealth: Payer: Self-pay | Admitting: Pulmonary Disease

## 2018-12-03 VITALS — BP 158/64 | HR 85 | Temp 98.4°F | Ht 69.0 in | Wt 207.4 lb

## 2018-12-03 DIAGNOSIS — R0602 Shortness of breath: Secondary | ICD-10-CM | POA: Insufficient documentation

## 2018-12-03 DIAGNOSIS — J4540 Moderate persistent asthma, uncomplicated: Secondary | ICD-10-CM

## 2018-12-03 DIAGNOSIS — G4733 Obstructive sleep apnea (adult) (pediatric): Secondary | ICD-10-CM

## 2018-12-03 DIAGNOSIS — J309 Allergic rhinitis, unspecified: Secondary | ICD-10-CM

## 2018-12-03 DIAGNOSIS — E1122 Type 2 diabetes mellitus with diabetic chronic kidney disease: Secondary | ICD-10-CM | POA: Diagnosis not present

## 2018-12-03 MED ORDER — PREDNISONE 10 MG PO TABS: ORAL_TABLET | ORAL | 0 refills | Status: DC

## 2018-12-03 MED ORDER — AMOXICILLIN-POT CLAVULANATE 875-125 MG PO TABS: 1.0000 | ORAL_TABLET | Freq: Two times a day (BID) | ORAL | 0 refills | Status: DC

## 2018-12-03 NOTE — Progress Notes (Signed)
Discussed results with patient in office but please contact tomorrow and also further emphasize.  Patient's chest x-ray showing multifocal pneumonia.  We will treat with antibiotics as well as a short course of prednisone as patient was wheezing.  If patient symptoms worsen or he has increased shortness of breath over the weekend he will need to present to [facility] emergency room for further evaluation.  Please continue to shelter in place and social distance.  Keep close follow-up with our office.  Let us know next week how you are doing if symptoms are improving.  Wyn Quaker, FNP

## 2018-12-03 NOTE — Telephone Encounter (Signed)
Office visit is fine if he would like to come in, otherwise may be able to take care of this over the phone. Likely will need a steroid course and maybe med reconciliation. I am not here this afternoon or tomorrow, one of the other APPs will be able to help him.

## 2018-12-03 NOTE — Assessment & Plan Note (Signed)
Plan: Continue Zyrtec daily Continue Xolair

## 2018-12-03 NOTE — Assessment & Plan Note (Addendum)
Assessment: Severe obstructive sleep apnea based off of split-night sleep study Patient is intolerant of CPAP due to claustrophobia Patient was never started on CPAP therapy  Plan: Can reevaluate at future office visits  Can consider overnight oximetry but discussed with patient as well as her spouse that this may not be covered due to the fact that he is untreated severe obstructive sleep apnea

## 2018-12-03 NOTE — Assessment & Plan Note (Signed)
Assessment: Last A1c in 05/2018 5.2  Plan: Informed patient to keep an eye on blood sugars due to the fact that he is getting a short course of prednisone If blood sugars are elevated patient to follow-up with primary care

## 2018-12-03 NOTE — Assessment & Plan Note (Addendum)
Assessment: Maintained on Breo Ellipta 200 Expiratory wheeze on exam today Basilar crackles right greater than left Increase shortness of breath over the last month Xolair injections every 3 weeks  Plan: Chest x-ray today Short prednisone course today Patient to wait in office to review chest x-ray results Continue Breo Ellipta 200 Continue rescue inhaler as needed Continue Xolair injections Continue Zyrtec Follow-up with our office in 2 weeks

## 2018-12-03 NOTE — Telephone Encounter (Signed)
Primary Pulmonologist: BQ Last office visit and with whom: 10/13/2018 with [name] What do we see them for (pulmonary problems): asthmatic bronchitis Last OV assessment/plan: Instructions  Return in about 3 months (around 01/13/2019).  Taker mucinex twice daily with full glass of water  Restart Breo 1 puff daily (sample given and will send in RX)  If no improvement or you develop a fever go ahead and start taking antibiotic  Stay well hydrated  Follow-up: Return if no improvement or symptoms worsen Follow up in May/June with Dr. Lake Bells        Was appointment offered to patient (explain)?  Pt would like to have appt   Reason for call: called and spoke with pt who stated even after increasing his Breo inhaler to Breo 200, pt has still been having problems with SOB. Pt stated he has had to use his rescue inhaler at least twice a day to help with the SOB.   Pt denies any complaints of chest tightness or fever. Pt states he has had a lingering cough due to recently having bronchitis.  Due to pt not really having any relief from [facility] and still having problems with SOB, pt would like to have an appt. Beth, please advise if this appt should be a televisit still or if you want pt to now come into the office for further evaluation since he did have a recent televisit with you? Thanks!

## 2018-12-03 NOTE — Telephone Encounter (Signed)
Called and spoke with pt stating to him that [facility] said we could get him scheduled for another televisit or face-to-face OV if he would like to so we could further evaluate symptoms and pt stated he would like to come into office for the appt due to recently having televisit. COVID screeen was negative as pt has not had any fever, no recent travelling, and not been around anyone that has been sick.  Pt scheduled for appt with Wyn Quaker today at 3pm. Nothing further needed.

## 2018-12-03 NOTE — Patient Instructions (Addendum)
Chest Xray today   Prednisone 10mg  tablet  >>>Take 2 tablets (20 mg total) daily for the next 5 days >>> Take with food in the morning  Augmentin >>> Take 1 875-125 mg tablet every 12 hours for the next 7 days >>> Take with food  Breo Ellipta 200 >>> Take 1 puff daily in the morning right when you wake up >>>Rinse your mouth out after use >>>This is a daily maintenance inhaler, NOT a rescue inhaler >>>Contact our office if you are having difficulties affording or obtaining this medication >>>It is important for you to be able to take this daily and not miss any doses   Only use your albuterol as a rescue medication to be used if you can't catch your breath by resting or doing a relaxed purse lip breathing pattern.  - The less you use it, the better it will work when you need it. - Ok to use up to 2 puffs  every 4 hours if you must but call for immediate appointment if use goes up over your usual need - Don't leave home without it !!  (think of it like the spare tire for your car)    Continue zyrtec   Will order overnight oximetry for concerns of oxygen levels at night   Return in about 2 weeks (around 12/17/2018), or if symptoms worsen or fail to improve, for Follow up with Wyn Quaker FNP-C.   Coronavirus (COVID-19) Are you at risk?  Are you at risk for the Coronavirus (COVID-19)?  To be considered HIGH RISK for Coronavirus (COVID-19), you have to meet the following criteria:  . Traveled to [place], [place], [place], [place] or [place]; or in the [place] to [place], [place], [place], or [place]; and have fever, cough, and shortness of breath within the last 2 weeks of travel OR . Been in close contact with a person diagnosed with COVID-19 within the last 2 weeks and have fever, cough, and shortness of breath . IF YOU DO NOT MEET THESE CRITERIA, YOU ARE CONSIDERED LOW RISK FOR COVID-19.  What to do if you are HIGH RISK for COVID-19?  Marland Kitchen If you are having a medical  emergency, call 911. . Seek medical care right away. Before you go to a doctor's office, urgent care or emergency department, call ahead and tell them about your recent travel, contact with someone diagnosed with COVID-19, and your symptoms. You should receive instructions from your physician's office regarding next steps of care.  . When you arrive at healthcare provider, tell the healthcare staff immediately you have returned from visiting [place], [place], [place], [place] or [place]; or traveled in the [place] to [place], [place], [place], or [place]; in the last two weeks or you have been in close contact with a person diagnosed with COVID-19 in the last 2 weeks.   . Tell the health care staff about your symptoms: fever, cough and shortness of breath. . After you have been seen by a medical provider, you will be either: o Tested for (COVID-19) and discharged home on quarantine except to seek medical care if symptoms worsen, and asked to  - Stay home and avoid contact with others until you get your results (4-5 days)  - Avoid travel on public transportation if possible (such as bus, train, or airplane) or o Sent to the Emergency Department by EMS for evaluation, COVID-19 testing, and possible admission depending on your condition and test results.  What to  do if you are LOW RISK for COVID-19?  Reduce your risk of any infection by using the same precautions used for avoiding the common cold or flu:  Marland Kitchen Wash your hands often with soap and warm water for at least 20 seconds.  If soap and water are not readily available, use an alcohol-based hand sanitizer with at least 60% alcohol.  . If coughing or sneezing, cover your mouth and nose by coughing or sneezing into the elbow areas of your shirt or coat, into a tissue or into your sleeve (not your hands). . Avoid shaking hands with others and consider head nods or verbal greetings only. . Avoid touching your eyes, nose, or mouth with  unwashed hands.  . Avoid close contact with people who are sick. . Avoid places or events with large numbers of people in one location, like concerts or sporting events. . Carefully consider travel plans you have or are making. . If you are planning any travel outside or inside the [place], visit the CDC's Travelers' Health webpage for the latest health notices. . If you have some symptoms but not all symptoms, continue to monitor at home and seek medical attention if your symptoms worsen. . If you are having a medical emergency, call 911.   [place] / e-Visit: [email or web address]         [facility]: [facility]: [phone]                   [facility]: [phone]           It is flu season:   >>> Best ways to protect herself from the flu: Receive the yearly flu vaccine, practice good hand hygiene washing with soap and also using hand sanitizer when available, eat a nutritious meals, get adequate rest, hydrate appropriately   Please contact the office if your symptoms worsen or you have concerns that you are not improving.   Thank you for choosing [facility] for your healthcare, and for allowing [place] to partner with you on your healthcare journey. I am thankful to be able to provide care to you today.   Wyn Quaker FNP-C

## 2018-12-03 NOTE — Progress Notes (Signed)
@Patient  ID: Shane [name], male    DOB: 1933-12-21, 85 y.o.   MRN: [id number]  Chief Complaint  Patient presents with   Acute Visit    shortness of breath    Referring provider: [name], MD  HPI:  85 year old male, former smoker quit in [place] (5 pack year hx). Followed in our office for Asthma.   PMH: significant for moderate persistent asthma, DM type 2 with renal complixations, MWF HD Smoker/ Smoking History: Former Smoker. 1960. 5 pack year history.   Maintenance:  Breo 200, Xolair q3 weeks Pt of: Dr. Lake Bells  12/03/2018  - Visit   85 year old male former smoker followed in our office for asthma.  He is followed by Dr. Lake Bells.  Patient presenting to our office today for an acute visit as he is reporting over the last 3 to 4 weeks has had progressive shortness of breath.  Patient was previously managed on [place] 100.  This was increased to Breo Ellipta 200 earlier this month.  Patient reports that his shortness of breath persist and does not feel he is getting better.  Patient reports that he has intermittent occasional wheezing.  His spouse reporting today that he wheezes at night when he sleeping and also his shortness of breath worsens at night.  Chart review reveals that patient had previously completed a split-night sleep study and appears to have severe obstructive sleep apnea, patient never started CPAP due to claustrophobia and him not tolerating the mask.  Patient reports that he was never started on CPAP therapy at all.  Patient feels that he may benefit from oxygen at night.  Patient would not qualify for oxygen based off of oxygen saturations walking our office today.  Patient reports that he continues to receive his Xolair injections every 3 weeks.  He does not feel that he is having an allergy attack or that he is having increased seasonal allergies.  He does admit that this season his allergies have been more poorly controlled.  Patient does report that he has  had some increased sinus pressure over the last 3 to 4 days.  Patient reports a intermittent occasional productive cough with yellow to white sputum.  He is using his rescue inhaler 2 times daily.  Patient is a Monday Wednesday Friday hemodialysis patient.  Patient reports his weights have been stable.  Patient reports he has not missed any of his hemodialysis appointments.   Tests:   08/26/2017-chest x-ray-no active cardiopulmonary disease  12/03/2018-chest x-ray- reticular nodular opacities in right greater than left lung, may represent multifocal pneumonia  01/01/2018-echocardiogram-LV ejection fraction 40 to 77%, grade 1 diastolic dysfunction, mild mitral regurgitation  12/07/2015- split-night sleep study- AHI 51.5  07/07/2013 - Allergy profile - 0.28 ragweed, ige 209  FENO:  No results found for: NITRICOXIDE  PFT: PFT Results Latest Ref Rng & Units 01/22/2016 07/07/2013  FVC-Pre L 2.97 -  FVC-Predicted Pre % 72 91  FVC-Post L 3.06 3.75  FVC-Predicted Post % 74 89  Pre FEV1/FVC % % 75 74  Post FEV1/FCV % % 77 79  FEV1-Pre L 2.23 2.85  FEV1-Predicted Pre % 76 94  FEV1-Post L 2.37 2.96  DLCO UNC% % 58 64  DLCO COR %Predicted % 82 78  TLC L 5.56 5.94  TLC % Predicted % 77 83  RV % Predicted % 91 81    Imaging: Dg Chest 2 View  Result Date: 12/03/2018 CLINICAL DATA:  85 year old male with shortness of breath  EXAM: CHEST - 2 VIEW COMPARISON:  12/16/2017 FINDINGS: Cardiomediastinal silhouette unchanged in size and contour. Unchanged position of left chest wall cardiac pacing device/AICD. Surgical changes of median sternotomy. Increased reticulonodular opacity of the right greater than left lungs, most pronounced at the right lung base. No pleural effusion or pneumothorax. No displaced fracture. IMPRESSION: Reticulonodular opacities of the right greater than left lung, may represent multifocal pneumonia. Unchanged cardiac AICD and changes of prior sternotomy Electronically Signed   By:  Corrie Mckusick D.O.   On: 12/03/2018 15:55      Specialty Problems      Pulmonary Problems   Allergic rhinitis    07/07/2013 - Allergy profile - 0.28 ragweed, ige 209       Asthma, moderate persistent    Pulmonary functions on 07/07/2013: FEV1 98% predicted FVC 89% predicted FEV1 to FVC ratio 79% predicted total lung capacity 83% predicted diffusion capacity 64% predicted airway resistance 163% predicted IgE level greater than 200 07/15/13 CT Sinus : sinusitis;   Ct Chest : normal      Sinusitis, chronic   Severe obstructive sleep apnea    12/07/2015- split-night sleep study- AHI 51.5      Restrictive lung disease   Bronchitis   Shortness of breath      Allergies  Allergen Reactions   Codeine Other (See Comments)    anxiety   Hydrocodone Other (See Comments)    Anxiety- can take in liquid form   Pseudoephedrine Other (See Comments)    Causes heart to race   Azithromycin Rash    Immunization History  Administered Date(s) Administered   Influenza Split 05/05/2013, 06/22/2015   Influenza Whole 06/03/2011   Influenza, High Dose Seasonal PF 04/08/2018   Influenza,inj,quad, With Preservative 05/05/2017   Influenza-Unspecified 06/01/2016, 06/05/2017   Pneumococcal Conjugate-13 07/17/2015    Past Medical History:  Diagnosis Date   Abnormal nuclear cardiac imaging test Nov 2010   moderate area of infarct in the inferior wall with only minimal reversibility and EF of 28%   AICD (automatic cardioverter/defibrillator) present    Allergic rhinitis 01-08-13   Uses nebulizer for chronic sinus issues and Mucinex.   Arthritis    osteoarthritis    Asthma    Extrinic   Blood transfusion    ? at time of bypass surgery    BPH (benign prostatic hyperplasia)    Cellulitis of arm, left    MSSA   CHF (congestive heart failure) ([place])    CKD (chronic kidney disease), stage III ([place])    "was told due to meds he takes"-no Renal workup done   Colon polyps     adenomatous   Coronary artery disease    remote CABG in 1985, cath in 2003 by Dr. Lia Foyer with no PCI, last nuclear in 2010 showing scar and EF of 28%.    Diabetes mellitus    Diabetic neuropathy ([place]) 04/25/2017   Dyslipidemia    Dyspnea    Gait abnormality 04/25/2017   Glaucoma 01-08-13   tx. eye drops   Hemodialysis patient ([place])    [place] (hard of hearing)    HTN (hypertension)    Hypercholesterolemia    Left ventricular dysfunction    28% per nuclear in 2010 and 35 to 40% per echo in 2010   Myocardial infarction [facility])    1985   Neuromuscular disorder ([place])    legs mild paralysis-able to walk"nerve damage"-legs- left leg brace    Neuropathy    Pneumonia  hx of several times years ago    Spinal stenosis     Tobacco History: Social History   Tobacco Use  Smoking Status Former Smoker   Packs/day: 1.00   Years: 10.00   Pack years: 10.00   Types: Cigarettes, Pipe, Cigars   Start date: 12/02/1949   Last attempt to quit: 08/05/1958   Years since quitting: 60.3  Smokeless Tobacco Never Used   Counseling given: Yes  Continue to not smoke  Outpatient Encounter Medications as of 12/03/2018  Medication Sig   Albuterol Sulfate (PROAIR RESPICLICK) 510 (90 Base) MCG/ACT AEPB Inhale 1-2 puffs into the lungs every 6 (six) hours as needed.   allopurinol (ZYLOPRIM) 100 MG tablet Take 1 tablet (100 mg total) by mouth daily.   amiodarone (PACERONE) 200 MG tablet Take 1 tablet (200 mg total) by mouth daily.   ammonium lactate (AMLACTIN) 12 % lotion Apply 1 application topically 2 (two) times daily.   aspirin EC 81 MG tablet Take 81 mg by mouth daily.    atorvastatin (LIPITOR) 10 MG tablet TAKE 1 TABLET DAILY   B Complex-C-Folic Acid (RENAL-VITE) 0.8 MG TABS Rena-Vite 0.8 mg tablet   calcitRIOL (ROCALTROL) 0.25 MCG capsule Take 1 capsule (0.25 mcg total) by mouth every Monday, Wednesday, and Friday with hemodialysis.   calcium acetate (PHOSLO) 667 MG capsule  TAKE 2 CAPSULES BY MOUTH THREE TIMES DAILY WITH MEALS AND 1 TWICE DAILY WITH SNACKS   cetirizine (ZYRTEC) 10 MG tablet Take 10 mg by mouth at bedtime as needed (seasonal allergies).    dextromethorphan-guaiFENesin (MUCINEX DM) 30-600 MG 12hr tablet Take 1 tablet by mouth 2 (two) times daily as needed for cough.   dicyclomine (BENTYL) 10 MG capsule TAKE 1 CAPSULE BY MOUTH THREE TIMES DAILY AS NEEDED USE SPARINGLY   diphenhydrAMINE (BENADRYL) 12.5 MG/5ML elixir Take by mouth.   fluocinonide cream (LIDEX) 2.58 % Apply 1 application topically 2 (two) times daily as needed (for itchy rash).    fluticasone furoate-vilanterol (BREO ELLIPTA) 200-25 MCG/INH AEPB Inhale 1 puff into the lungs daily.   glipiZIDE (GLUCOTROL XL) 2.5 MG 24 hr tablet Take 5 mg by mouth daily.    lidocaine-prilocaine (EMLA) cream Apply 1 application topically daily as needed for painful procedure/intervention.   lidocaine-prilocaine (EMLA) cream lidocaine-prilocaine 2.5 %-2.5 % topical cream   multivitamin (RENA-VIT) TABS tablet Take 1 tablet by mouth at bedtime.   OMEPRAZOLE PO Take 20 mg by mouth daily.   polyethylene glycol (MIRALAX / GLYCOLAX) packet Take 17 g by mouth daily as needed for mild constipation.   Polyethylene Glycol 3350 GRAN polyethylene glycol 3350 17 gram oral powder packet   Syringe/Needle, Disp, (SYRINGE 3CC/22GX1") 22G X 1" 3 ML MISC Use to give injection   tadalafil (CIALIS) 20 MG tablet Take 20 mg by mouth daily as needed for erectile dysfunction. Do not exceed 2 doses in 7 days   testosterone cypionate (DEPOTESTOTERONE CYPIONATE) 100 MG/ML injection Inject 400 mg into the muscle every 21 ( twenty-one) days. For IM use only   Testosterone Cypionate 100 MG/ML SOLN Inject into the muscle.   Testosterone Cypionate 100 MG/ML SOLN Inject into the muscle.   triamcinolone (NASACORT AQ) 55 MCG/ACT AERO nasal inhaler Place 2 sprays into the nose daily as needed (seasonal allergies).    zolpidem  (AMBIEN) 10 MG tablet TAKE 1/2 (ONE-HALF) TABLET BY MOUTH AT BEDTIME. TAKE AN ADDITIONAL 1/2 TABLET IF NEEDED FOR SLEEP   [DISCONTINUED] doxycycline (VIBRA-TABS) 100 MG tablet Take 1 tablet (100 mg  total) by mouth 2 (two) times daily.   amoxicillin-clavulanate (AUGMENTIN) 875-125 MG tablet Take 1 tablet by mouth 2 (two) times daily.   predniSONE (DELTASONE) 10 MG tablet Take 2 tablets (20mg  total) daily for the next 5 days. Take in the AM with food.   [DISCONTINUED] amLODipine (NORVASC) 5 MG tablet amlodipine 5 mg tablet   [DISCONTINUED] cephALEXin (KEFLEX) 500 MG capsule cephalexin 500 mg capsule   [DISCONTINUED] hydrALAZINE (APRESOLINE) 50 MG tablet hydralazine 50 mg tablet   [DISCONTINUED] potassium chloride SA (KLOR-CON M20) 20 MEQ tablet [place]-Con M20 mEq tablet,extended release   [DISCONTINUED] torsemide (DEMADEX) 20 MG tablet torsemide 20 mg tablet   Facility-Administered Encounter Medications as of 12/03/2018  Medication   omalizumab Arvid Right) injection 300 mg   omalizumab Arvid Right) injection 300 mg     Review of Systems  Review of Systems  Constitutional: Positive for fatigue. Negative for activity change, appetite change, chills, fever and unexpected weight change.  HENT: Positive for sinus pressure (slight increased sinus pressure / fullness). Negative for postnasal drip, sinus pain, sneezing and sore throat.   Eyes: Negative.   Respiratory: Positive for cough (occasional, productive - yellow white sputum ), shortness of breath and wheezing (worse at night ).   Cardiovascular: Negative for chest pain and palpitations.  Gastrointestinal: Negative for diarrhea, nausea and vomiting.  Endocrine: Negative.   Genitourinary: Negative for urgency.  Musculoskeletal: Negative.   Skin: Negative.   Allergic/Immunologic: Positive for environmental allergies.  Neurological: Negative for dizziness and headaches.  Psychiatric/Behavioral: Negative.  Negative for dysphoric mood. The  patient is not nervous/anxious.   All other systems reviewed and are negative.    Physical Exam  BP (!) 158/64 (BP Location: Left Arm, Cuff Size: Normal)    Pulse 85    Temp 98.4 F (36.9 C)    Ht 5\' 9"  (1.753 m)    Wt 207 lb 6.4 oz (94.1 kg)    SpO2 95%    BMI 30.63 kg/m   Wt Readings from Last 5 Encounters:  12/03/18 207 lb 6.4 oz (94.1 kg)  10/13/18 207 lb 9.6 oz (94.2 kg)  07/08/18 204 lb (92.5 kg)  05/26/18 201 lb (91.2 kg)  04/21/18 199 lb (90.3 kg)     Physical Exam  Constitutional: He is oriented to person, place, and time and well-developed, well-nourished, and in no distress. No distress.  Chronically ill elderly adult male  HENT:  Head: Normocephalic and atraumatic.  Right Ear: Hearing, external ear and ear canal normal.  Left Ear: Hearing, external ear and ear canal normal.  Nose: Rhinorrhea present. Right sinus exhibits no maxillary sinus tenderness and no frontal sinus tenderness. Left sinus exhibits no maxillary sinus tenderness and no frontal sinus tenderness.  Mouth/Throat: Uvula is midline and oropharynx is clear and moist. No oropharyngeal exudate.  TMs with effusion without infection bilaterally, patient wears hearing aids bilaterally Post Nasal drip  Eyes: Pupils are equal, round, and reactive to light.  Neck: Normal range of motion. Neck supple.  Cardiovascular: Normal rate, regular rhythm and normal heart sounds.  Occasional extrasystoles are present.  Pulmonary/Chest: Effort normal. No accessory muscle usage. No respiratory distress. He has no decreased breath sounds. He has wheezes (exp wheeze, improves with cough ). He has no rhonchi. He has rales (Basilar Crackles - R > L ).  Abdominal: Soft. Bowel sounds are normal. There is no abdominal tenderness.  Musculoskeletal: Normal range of motion.        General: No edema.  Lymphadenopathy:  He has no cervical adenopathy.  Neurological: He is alert and oriented to person, place, and time. Gait normal.    Skin: Skin is warm, dry and intact. Bruising noted. He is not diaphoretic. No erythema.     Bruising on right arm  Psychiatric: Mood, memory, affect and judgment normal.  Nursing note and vitals reviewed.     Lab Results:  CBC    Component Value Date/Time   WBC 8.2 12/19/2017 0830   RBC 3.08 (L) 12/19/2017 0830   HGB 8.8 (L) 12/19/2017 0830   HGB 11.5 (L) 10/27/2017 1222   HCT 28.5 (L) 12/19/2017 0830   HCT 36.4 (L) 10/27/2017 1222   PLT 123 (L) 12/19/2017 0830   PLT 100 (LL) 10/27/2017 1222   MCV 92.5 12/19/2017 0830   MCV 95 10/27/2017 1222   MCH 28.6 12/19/2017 0830   MCHC 30.9 12/19/2017 0830   RDW 14.5 12/19/2017 0830   RDW 15.6 (H) 10/27/2017 1222   LYMPHSABS 1.2 12/16/2017 0133   LYMPHSABS 0.6 (L) 10/27/2017 1222   MONOABS 1.1 12/16/2017 0133   EOSABS 0.1 12/16/2017 0133   EOSABS 0.0 10/27/2017 1222   BASOSABS 0.0 12/16/2017 0133   BASOSABS 0.0 10/27/2017 1222    BMET    Component Value Date/Time   NA 135 12/19/2017 0830   NA 136 10/27/2017 1222   K 3.9 12/19/2017 0830   CL 97 (L) 12/19/2017 0830   CO2 25 12/19/2017 0830   GLUCOSE 163 (H) 12/19/2017 0830   BUN 77 (H) 12/19/2017 0830   BUN 109 (HH) 10/27/2017 1222   CREATININE 4.34 (H) 12/19/2017 0830   CREATININE 3.09 (H) 07/01/2016 0907   CALCIUM 8.5 (L) 12/19/2017 0830   GFRNONAA 12 (L) 12/19/2017 0830   GFRNONAA 21 (L) 02/15/2016 1607   GFRAA 13 (L) 12/19/2017 0830   GFRAA 24 (L) 02/15/2016 1607    BNP    Component Value Date/Time   BNP 681.1 (H) 12/16/2017 0133    ProBNP No results found for: PROBNP    Assessment & Plan:     Severe obstructive sleep apnea Assessment: Severe obstructive sleep apnea based off of split-night sleep study Patient is intolerant of CPAP due to claustrophobia Patient was never started on CPAP therapy  Plan: Can reevaluate at future office visits  Can consider overnight oximetry but discussed with patient as well as her spouse that this may not be  covered due to the fact that he is untreated severe obstructive sleep apnea  Asthma, moderate persistent Assessment: Maintained on Breo Ellipta 200 Expiratory wheeze on exam today Basilar crackles right greater than left Increase shortness of breath over the last month Xolair injections every 3 weeks  Plan: Chest x-ray today Short prednisone course today Patient to wait in office to review chest x-ray results Continue Breo Ellipta 200 Continue rescue inhaler as needed Continue Xolair injections Continue Zyrtec Follow-up with our office in 2 weeks  Allergic rhinitis Plan: Continue Zyrtec daily Continue Xolair  DM (diabetes mellitus), type 2 with renal complications ([place]) Assessment: Last A1c in 05/2018 5.2  Plan: Informed patient to keep an eye on blood sugars due to the fact that he is getting a short course of prednisone If blood sugars are elevated patient to follow-up with primary care   Shortness of breath Assessment: Worsening shortness of breath over the last 3 to 4 weeks Patient with multiple chronic comorbidities: Reduced ejection fraction on last echocardiogram, Monday Wednesday Friday HD patient, chronic asthma Patient remains afebrile, denies body  aches or chills Expiratory wheeze on exam today Basilar crackles right greater than left Patient reporting productive cough with white to yellow sputum Patient and patient spouse report that he has more difficulty breathing at night Patient with known severe obstructive sleep apnea which is untreated due to patient's claustrophobia  Plan: Wide differential diagnosis for patient's shortness of breath.  With patient's basilar crackles will proceed forward with chest x-ray today.  Chest x-ray revealing multifocal pneumonia.  With antibiotic treatment if symptoms are not improving will need to also consider other differential diagnosis such as pulmonary embolism, COVID-19 infection, CHF exacerbation etc.  We will treat  with a short course of prednisone as well as Augmentin Patient also have close follow-up with our office Emphasized to the patient as well as the spouse the importance of if symptoms worsen or fevers develop to present to the emergency room for further evaluation  The patient has high risk of WOEHO-12 complications.  We are currently in a COVID-19 worldwide pandemic.  Patient with multiple comorbidities as well as high exposure risk with Monday Wednesday Friday HD.  Patient has remained afebrile, is afebrile in office today.  Patient denies body aches or chills.  But with multifocal pneumonia on chest x-ray we need to keep this in our differential diagnosis.  I have discussed this with the patient as well as with his spouse today.  If symptoms worsen patient will need to present to [facility] long emergency room.    Lauraine Rinne, NP 12/03/2018   This appointment was 55 min long with over 50% of the time in direct face-to-face patient care, assessment, plan of care, and follow-up.

## 2018-12-03 NOTE — Progress Notes (Signed)
Remote ICD transmission.   

## 2018-12-03 NOTE — Assessment & Plan Note (Addendum)
Assessment: Worsening shortness of breath over the last 3 to 4 weeks Patient with multiple chronic comorbidities: Reduced ejection fraction on last echocardiogram, Monday Wednesday Friday HD patient, chronic asthma Patient remains afebrile, denies body aches or chills Expiratory wheeze on exam today Basilar crackles right greater than left Patient reporting productive cough with white to yellow sputum Patient and patient spouse report that he has more difficulty breathing at night Patient with known severe obstructive sleep apnea which is untreated due to patient's claustrophobia  Plan: Wide differential diagnosis for patient's shortness of breath.  With patient's basilar crackles will proceed forward with chest x-ray today.  Chest x-ray revealing multifocal pneumonia.  With antibiotic treatment if symptoms are not improving will need to also consider other differential diagnosis such as pulmonary embolism, COVID-19 infection, CHF exacerbation etc.  We will treat with a short course of prednisone as well as Augmentin Patient also have close follow-up with our office Emphasized to the patient as well as the spouse the importance of if symptoms worsen or fevers develop to present to the emergency room for further evaluation  The patient has high risk of [id number]-48 complications.  We are currently in a COVID-19 worldwide pandemic.  Patient with multiple comorbidities as well as high exposure risk with Monday Wednesday Friday HD.  Patient has remained afebrile, is afebrile in office today.  Patient denies body aches or chills.  But with multifocal pneumonia on chest x-ray we need to keep this in our differential diagnosis.  I have discussed this with the patient as well as with his spouse today.  If symptoms worsen patient will need to present to [facility] long emergency room.

## 2018-12-04 DIAGNOSIS — E1129 Type 2 diabetes mellitus with other diabetic kidney complication: Secondary | ICD-10-CM | POA: Diagnosis not present

## 2018-12-04 DIAGNOSIS — Z992 Dependence on renal dialysis: Secondary | ICD-10-CM | POA: Diagnosis not present

## 2018-12-04 DIAGNOSIS — D631 Anemia in chronic kidney disease: Secondary | ICD-10-CM | POA: Diagnosis not present

## 2018-12-04 DIAGNOSIS — N186 End stage renal disease: Secondary | ICD-10-CM | POA: Diagnosis not present

## 2018-12-04 DIAGNOSIS — D509 Iron deficiency anemia, unspecified: Secondary | ICD-10-CM | POA: Diagnosis not present

## 2018-12-04 DIAGNOSIS — N2581 Secondary hyperparathyroidism of renal origin: Secondary | ICD-10-CM | POA: Diagnosis not present

## 2018-12-04 DIAGNOSIS — I129 Hypertensive chronic kidney disease with stage 1 through stage 4 chronic kidney disease, or unspecified chronic kidney disease: Secondary | ICD-10-CM | POA: Diagnosis not present

## 2018-12-04 DIAGNOSIS — Z23 Encounter for immunization: Secondary | ICD-10-CM | POA: Diagnosis not present

## 2018-12-04 NOTE — Progress Notes (Signed)
Reviewed, agree 

## 2018-12-07 DIAGNOSIS — N2581 Secondary hyperparathyroidism of renal origin: Secondary | ICD-10-CM | POA: Diagnosis not present

## 2018-12-07 DIAGNOSIS — Z23 Encounter for immunization: Secondary | ICD-10-CM | POA: Diagnosis not present

## 2018-12-07 DIAGNOSIS — D509 Iron deficiency anemia, unspecified: Secondary | ICD-10-CM | POA: Diagnosis not present

## 2018-12-07 DIAGNOSIS — D631 Anemia in chronic kidney disease: Secondary | ICD-10-CM | POA: Diagnosis not present

## 2018-12-07 DIAGNOSIS — N186 End stage renal disease: Secondary | ICD-10-CM | POA: Diagnosis not present

## 2018-12-07 DIAGNOSIS — E1129 Type 2 diabetes mellitus with other diabetic kidney complication: Secondary | ICD-10-CM | POA: Diagnosis not present

## 2018-12-08 ENCOUNTER — Ambulatory Visit (INDEPENDENT_AMBULATORY_CARE_PROVIDER_SITE_OTHER): Payer: Medicare Other

## 2018-12-08 ENCOUNTER — Other Ambulatory Visit: Payer: Self-pay

## 2018-12-08 DIAGNOSIS — J4540 Moderate persistent asthma, uncomplicated: Secondary | ICD-10-CM

## 2018-12-08 MED ORDER — OMALIZUMAB 150 MG ~~LOC~~ SOLR
300.0000 mg | Freq: Once | SUBCUTANEOUS | Status: AC
  Administered 2018-12-08: 12:00:00 300 mg via SUBCUTANEOUS

## 2018-12-08 NOTE — Progress Notes (Signed)
Have you been hospitalized within the last 10 days?  No Do you have a fever?  No Do you have a cough?  Yes, a little cough. SOB Pt saw B. Mack 12/03/2018. Per BM ok to give inj. Today. Do you have a headache or sore throat? No

## 2018-12-09 DIAGNOSIS — D509 Iron deficiency anemia, unspecified: Secondary | ICD-10-CM | POA: Diagnosis not present

## 2018-12-09 DIAGNOSIS — N186 End stage renal disease: Secondary | ICD-10-CM | POA: Diagnosis not present

## 2018-12-09 DIAGNOSIS — Z23 Encounter for immunization: Secondary | ICD-10-CM | POA: Diagnosis not present

## 2018-12-09 DIAGNOSIS — D631 Anemia in chronic kidney disease: Secondary | ICD-10-CM | POA: Diagnosis not present

## 2018-12-09 DIAGNOSIS — N2581 Secondary hyperparathyroidism of renal origin: Secondary | ICD-10-CM | POA: Diagnosis not present

## 2018-12-09 DIAGNOSIS — E1129 Type 2 diabetes mellitus with other diabetic kidney complication: Secondary | ICD-10-CM | POA: Diagnosis not present

## 2018-12-10 ENCOUNTER — Other Ambulatory Visit: Payer: Self-pay | Admitting: Internal Medicine

## 2018-12-10 DIAGNOSIS — F5101 Primary insomnia: Secondary | ICD-10-CM

## 2018-12-11 DIAGNOSIS — Z23 Encounter for immunization: Secondary | ICD-10-CM | POA: Diagnosis not present

## 2018-12-11 DIAGNOSIS — N186 End stage renal disease: Secondary | ICD-10-CM | POA: Diagnosis not present

## 2018-12-11 DIAGNOSIS — E1129 Type 2 diabetes mellitus with other diabetic kidney complication: Secondary | ICD-10-CM | POA: Diagnosis not present

## 2018-12-11 DIAGNOSIS — D509 Iron deficiency anemia, unspecified: Secondary | ICD-10-CM | POA: Diagnosis not present

## 2018-12-11 DIAGNOSIS — D631 Anemia in chronic kidney disease: Secondary | ICD-10-CM | POA: Diagnosis not present

## 2018-12-11 DIAGNOSIS — N2581 Secondary hyperparathyroidism of renal origin: Secondary | ICD-10-CM | POA: Diagnosis not present

## 2018-12-11 NOTE — Telephone Encounter (Signed)
Check [place] registry last filled 11/11/2018.Marland KitchenJohny Chess

## 2018-12-11 NOTE — Telephone Encounter (Signed)
Sent ambien in but he is overdue for a 6 month f/u -- see if he can do a virtual visit

## 2018-12-14 DIAGNOSIS — Z23 Encounter for immunization: Secondary | ICD-10-CM | POA: Diagnosis not present

## 2018-12-14 DIAGNOSIS — D509 Iron deficiency anemia, unspecified: Secondary | ICD-10-CM | POA: Diagnosis not present

## 2018-12-14 DIAGNOSIS — D631 Anemia in chronic kidney disease: Secondary | ICD-10-CM | POA: Diagnosis not present

## 2018-12-14 DIAGNOSIS — E1129 Type 2 diabetes mellitus with other diabetic kidney complication: Secondary | ICD-10-CM | POA: Diagnosis not present

## 2018-12-14 DIAGNOSIS — N2581 Secondary hyperparathyroidism of renal origin: Secondary | ICD-10-CM | POA: Diagnosis not present

## 2018-12-14 DIAGNOSIS — N186 End stage renal disease: Secondary | ICD-10-CM | POA: Diagnosis not present

## 2018-12-15 ENCOUNTER — Other Ambulatory Visit: Payer: Self-pay

## 2018-12-15 ENCOUNTER — Ambulatory Visit (INDEPENDENT_AMBULATORY_CARE_PROVIDER_SITE_OTHER): Payer: Medicare Other | Admitting: Podiatry

## 2018-12-15 VITALS — Temp 97.5°F

## 2018-12-15 DIAGNOSIS — E1151 Type 2 diabetes mellitus with diabetic peripheral angiopathy without gangrene: Secondary | ICD-10-CM | POA: Diagnosis not present

## 2018-12-15 DIAGNOSIS — B353 Tinea pedis: Secondary | ICD-10-CM | POA: Diagnosis not present

## 2018-12-15 DIAGNOSIS — M79674 Pain in right toe(s): Secondary | ICD-10-CM

## 2018-12-15 DIAGNOSIS — B351 Tinea unguium: Secondary | ICD-10-CM

## 2018-12-15 DIAGNOSIS — M79675 Pain in left toe(s): Secondary | ICD-10-CM | POA: Diagnosis not present

## 2018-12-15 DIAGNOSIS — E1142 Type 2 diabetes mellitus with diabetic polyneuropathy: Secondary | ICD-10-CM

## 2018-12-15 MED ORDER — CLOTRIMAZOLE-BETAMETHASONE 1-0.05 % EX LOTN: TOPICAL_LOTION | CUTANEOUS | 1 refills | Status: AC

## 2018-12-15 NOTE — Patient Instructions (Addendum)
Peripheral Vascular Disease  Peripheral vascular disease (PVD) is a disease of the blood vessels that are not part of your heart and brain. A simple term for PVD is poor circulation. In most cases, PVD narrows the blood vessels that carry blood from your heart to the rest of your body. This can reduce the supply of blood to your arms, legs, and internal organs, like your stomach or kidneys. However, PVD most often affects a person's lower legs and feet. Without treatment, PVD tends to get worse. PVD can also lead to acute ischemic limb. This is when an arm or leg suddenly cannot get enough blood. This is a medical emergency. Follow these instructions at home: Lifestyle  Do not use any products that contain nicotine or tobacco, such as cigarettes and e-cigarettes. If you need help quitting, ask your doctor.  Lose weight if you are overweight. Or, stay at a healthy weight as told by your doctor.  Eat a diet that is low in fat and cholesterol. If you need help, ask your doctor.  Exercise regularly. Ask your doctor for activities that are right for you. General instructions  Take over-the-counter and prescription medicines only as told by your doctor.  Take good care of your feet: ? Wear comfortable shoes that fit well. ? Check your feet often for any cuts or sores.  Keep all follow-up visits as told by your doctor This is important. Contact a doctor if:  You have cramps in your legs when you walk.  You have leg pain when you are at rest.  You have coldness in a leg or foot.  Your skin changes.  You are unable to get or have an erection (erectile dysfunction).  You have cuts or sores on your feet that do not heal. Get help right away if:  Your arm or leg turns cold, numb, and blue.  Your arms or legs become red, warm, swollen, painful, or numb.  You have chest pain.  You have trouble breathing.  You suddenly have weakness in your face, arm, or leg.  You become very  confused or you cannot speak.  You suddenly have a very bad headache.  You suddenly cannot see. Summary  Peripheral vascular disease (PVD) is a disease of the blood vessels.  A simple term for PVD is poor circulation. Without treatment, PVD tends to get worse.  Treatment may include exercise, low fat and low cholesterol diet, and quitting smoking. This information is not intended to replace advice given to you by your health care provider. Make sure you discuss any questions you have with your health care provider. Document Released: 10/16/2009 Document Revised: 08/29/2016 Document Reviewed: 08/29/2016 Elsevier Interactive Patient Education  2019 [place]  Athlete's foot (tinea pedis) is a fungal infection of the skin on your feet. It often occurs on the skin that is between or underneath the toes. It can also occur on the soles of your feet. The infection can spread from person to person (is contagious). It can also spread when a person's bare feet come in contact with the fungus on shower floors or on items such as shoes. What are the causes? This condition is caused by a fungus that grows in warm, moist places. You can get athlete's foot by sharing shoes, shower stalls, towels, and wet floors with someone who is infected. Not washing your feet or changing your socks often enough can also lead to athlete's foot. What increases the risk? This condition  is more likely to develop in:  Men.  People who have a weak body defense system (immune system).  People who have diabetes.  People who use public showers, such as at a gym.  People who wear heavy-duty shoes, such as Environmental manager.  Seasons with warm, humid weather. What are the signs or symptoms? Symptoms of this condition include:  Itchy areas between your toes or on the soles of your feet.  White, flaky, or scaly areas between your toes or on the soles of your feet.  Very itchy small  blisters between your toes or on the soles of your feet.  Small cuts in your skin. These cuts can become infected.  Thick or discolored toenails. How is this diagnosed? This condition may be diagnosed with a physical exam and a review of your medical history. Your health care provider may also take a skin or toenail sample to examine under a microscope. How is this treated? This condition is treated with antifungal medicines. These may be applied as powders, ointments, or creams. In severe cases, an oral antifungal medicine may be given. Follow these instructions at home: Medicines  Apply or take over-the-counter and prescription medicines only as told by your health care provider.  Apply your antifungal medicine as told by your health care provider. Do not stop using the antifungal even if your condition improves. Foot care  Do not scratch your feet.  Keep your feet dry: ? Wear cotton or wool socks. Change your socks every day or if they become wet. ? Wear shoes that allow air to flow, such as sandals or canvas tennis shoes.  Wash and dry your feet, including the area between your toes. Also, wash and dry your feet: ? Every day or as told by your health care provider. ? After exercising. General instructions  Do not let others use towels, shoes, nail clippers, or other personal items that touch your feet.  Protect your feet by wearing sandals in wet areas, such as locker rooms and shared showers.  Keep all follow-up visits as told by your health care provider. This is important.  If you have diabetes, keep your blood sugar under control. Contact a health care provider if:  You have a fever.  You have swelling, soreness, warmth, or redness in your foot.  Your feet are not getting better with treatment.  Your symptoms get worse.  You have new symptoms. Summary  Athlete's foot (tinea pedis) is a fungal infection of the skin on your feet. It often occurs on skin that is  between or underneath the toes.  This condition is caused by a fungus that grows in warm, moist places.  Symptoms include white, flaky, or scaly areas between your toes or on the soles of your feet.  This condition is treated with antifungal medicines.  Keep your feet clean. Always dry them thoroughly. This information is not intended to replace advice given to you by your health care provider. Make sure you discuss any questions you have with your health care provider. Document Released: 07/19/2000 Document Revised: 05/12/2017 Document Reviewed: 05/12/2017 Elsevier Interactive Patient Education  2019 [employer]   Diabetes Mellitus and [place] care is an important part of your health, especially when you have diabetes. Diabetes may cause you to have problems because of poor blood flow (circulation) to your feet and legs, which can cause your skin to:  Become thinner and drier.  Break more easily.  Heal more slowly.  Peel and crack. You may also have nerve damage (neuropathy) in your legs and feet, causing decreased feeling in them. This means that you may not notice minor injuries to your feet that could lead to more serious problems. Noticing and addressing any potential problems early is the best way to prevent future foot problems. How to care for your feet Foot hygiene  Wash your feet daily with warm water and mild soap. Do not use hot water. Then, pat your feet and the areas between your toes until they are completely dry. Do not soak your feet as this can dry your skin.  Trim your toenails straight across. Do not dig under them or around the cuticle. File the edges of your nails with an emery board or nail file.  Apply a moisturizing lotion or petroleum jelly to the skin on your feet and to dry, brittle toenails. Use lotion that does not contain alcohol and is unscented. Do not apply lotion between your toes. Shoes and socks  Wear clean socks or stockings every  day. Make sure they are not too tight. Do not wear knee-high stockings since they may decrease blood flow to your legs.  Wear shoes that fit properly and have enough cushioning. Always look in your shoes before you put them on to be sure there are no objects inside.  To break in new shoes, wear them for just a few hours a day. This prevents injuries on your feet. Wounds, scrapes, corns, and calluses  Check your feet daily for blisters, cuts, bruises, sores, and redness. If you cannot see the bottom of your feet, use a mirror or ask someone for help.  Do not cut corns or calluses or try to remove them with medicine.  If you find a minor scrape, cut, or break in the skin on your feet, keep it and the skin around it clean and dry. You may clean these areas with mild soap and water. Do not clean the area with peroxide, alcohol, or iodine.  If you have a wound, scrape, corn, or callus on your foot, look at it several times a day to make sure it is healing and not infected. Check for: ? Redness, swelling, or pain. ? Fluid or blood. ? Warmth. ? Pus or a bad smell. General instructions  Do not cross your legs. This may decrease blood flow to your feet.  Do not use heating pads or hot water bottles on your feet. They may burn your skin. If you have lost feeling in your feet or legs, you may not know this is happening until it is too late.  Protect your feet from hot and cold by wearing shoes, such as at the beach or on hot pavement.  Schedule a complete foot exam at least once a year (annually) or more often if you have foot problems. If you have foot problems, report any cuts, sores, or bruises to your health care provider immediately. Contact a health care provider if:  You have a medical condition that increases your risk of infection and you have any cuts, sores, or bruises on your feet.  You have an injury that is not healing.  You have redness on your legs or feet.  You feel burning  or tingling in your legs or feet.  You have pain or cramps in your legs and feet.  Your legs or feet are numb.  Your feet always feel cold.  You have pain around a toenail. Get help right away  if:  You have a wound, scrape, corn, or callus on your foot and: ? You have pain, swelling, or redness that gets worse. ? You have fluid or blood coming from the wound, scrape, corn, or callus. ? Your wound, scrape, corn, or callus feels warm to the touch. ? You have pus or a bad smell coming from the wound, scrape, corn, or callus. ? You have a fever. ? You have a red line going up your leg. Summary  Check your feet every day for cuts, sores, red spots, swelling, and blisters.  Moisturize feet and legs daily.  Wear shoes that fit properly and have enough cushioning.  If you have foot problems, report any cuts, sores, or bruises to your health care provider immediately.  Schedule a complete foot exam at least once a year (annually) or more often if you have foot problems. This information is not intended to replace advice given to you by your health care provider. Make sure you discuss any questions you have with your health care provider. Document Released: 07/19/2000 Document Revised: 09/03/2017 Document Reviewed: 08/23/2016 Elsevier Interactive Patient Education  2019 [employer]  Onychomycosis/Fungal Toenails  WHAT IS IT? An infection that lies within the keratin of your nail plate that is caused by a fungus.  WHY ME? Fungal infections affect all ages, sexes, races, and creeds.  There may be many factors that predispose you to a fungal infection such as age, coexisting medical conditions such as diabetes, or an autoimmune disease; stress, medications, fatigue, genetics, etc.  Bottom line: fungus thrives in a warm, moist environment and your shoes offer such a location.  IS IT CONTAGIOUS? Theoretically, yes.  You do not want to share shoes, nail clippers or files with someone who  has fungal toenails.  Walking around barefoot in the same room or sleeping in the same bed is unlikely to transfer the organism.  It is important to realize, however, that fungus can spread easily from one nail to the next on the same foot.  HOW DO WE TREAT THIS?  There are several ways to treat this condition.  Treatment may depend on many factors such as age, medications, pregnancy, liver and kidney conditions, etc.  It is best to ask your doctor which options are available to you.  1. No treatment.   Unlike many other medical concerns, you can live with this condition.  However for many people this can be a painful condition and may lead to ingrown toenails or a bacterial infection.  It is recommended that you keep the nails cut short to help reduce the amount of fungal nail. 2. Topical treatment.  These range from herbal remedies to prescription strength nail lacquers.  About 40-50% effective, topicals require twice daily application for approximately 9 to 12 months or until an entirely new nail has grown out.  The most effective topicals are medical grade medications available through physicians offices. 3. Oral antifungal medications.  With an 80-90% cure rate, the most common oral medication requires 3 to 4 months of therapy and stays in your system for a year as the new nail grows out.  Oral antifungal medications do require blood work to make sure it is a safe drug for you.  A liver function panel will be performed prior to starting the medication and after the first month of treatment.  It is important to have the blood work performed to avoid any harmful side effects.  In general, this medication safe but blood work  is required. 4. Laser Therapy.  This treatment is performed by applying a specialized laser to the affected nail plate.  This therapy is noninvasive, fast, and non-painful.  It is not covered by insurance and is therefore, out of pocket.  The results have been very good with a 80-95%  cure rate.  [facility] is the only practice in the area to offer this therapy. 5. Permanent Nail Avulsion.  Removing the entire nail so that a new nail will not grow back.

## 2018-12-16 ENCOUNTER — Telehealth: Payer: Self-pay | Admitting: Cardiovascular Disease

## 2018-12-16 DIAGNOSIS — R0989 Other specified symptoms and signs involving the circulatory and respiratory systems: Secondary | ICD-10-CM

## 2018-12-16 DIAGNOSIS — N2581 Secondary hyperparathyroidism of renal origin: Secondary | ICD-10-CM | POA: Diagnosis not present

## 2018-12-16 DIAGNOSIS — E1129 Type 2 diabetes mellitus with other diabetic kidney complication: Secondary | ICD-10-CM | POA: Diagnosis not present

## 2018-12-16 DIAGNOSIS — D631 Anemia in chronic kidney disease: Secondary | ICD-10-CM | POA: Diagnosis not present

## 2018-12-16 DIAGNOSIS — D509 Iron deficiency anemia, unspecified: Secondary | ICD-10-CM | POA: Diagnosis not present

## 2018-12-16 DIAGNOSIS — N186 End stage renal disease: Secondary | ICD-10-CM | POA: Diagnosis not present

## 2018-12-16 DIAGNOSIS — Z23 Encounter for immunization: Secondary | ICD-10-CM | POA: Diagnosis not present

## 2018-12-16 NOTE — Telephone Encounter (Signed)
New Message    Pt is calling because he saw his foot doctor and they told him to schedule a vascular test with his cardiologist  He said he can only do this test in the afternoons after 2pm

## 2018-12-16 NOTE — Progress Notes (Signed)
@Patient  ID: Shane [name], male    DOB: 1934/03/05, 85 y.o.   MRN: [id number]  Chief Complaint  Patient presents with  . Follow-up    SOB, cough with white mucus    Referring provider: [name], MD  HPI:  85 year old male, former smoker quit in 1960 (5 pack year hx). Followed in our office for Asthma.   PMH: significant for moderate persistent asthma, DM type 2 with renal complixations, MWF HD Smoker/ Smoking History: Former Smoker. 1960. 5 pack year history.   Maintenance:  Breo 200, Xolair q3 weeks Pt of: Dr. Lake Bells  12/17/2018  - Visit   85 year old male former smoker followed in our office for moderate persistent asthma.  He is managed on Brio Ellipta 200 as well as Xolair every 3 weeks.  Patient was last seen in our office 2 weeks ago where chest x-ray revealed multifocal pneumonia.  Patient was treated with Augmentin as well as short course of prednisone.  Patient reports that he has had clinical improvement since that office visit.  He reports that the medications helped him significantly.  Patient reports that he has had some fatigue and does not feel he is improving as quickly as he was while he was on medications.  Patient does not feel like he is clinically worsening at this time.  He continues to complete Monday Wednesday Friday HD.  He denies fevers, chills, body aches, worsening shortness of breath.  Patient continues to use his rescue inhaler 1 time daily.  This is his baseline.  Patient reports that he is sleeping better to since last office visit.   Tests:   08/26/2017-chest x-ray-no active cardiopulmonary disease  12/03/2018-chest x-ray- reticular nodular opacities in right greater than left lung, may represent multifocal pneumonia  01/01/2018-echocardiogram-LV ejection fraction 40 to 54%, grade 1 diastolic dysfunction, mild mitral regurgitation  12/07/2015- split-night sleep study- AHI 51.5  07/07/2013 - Allergy profile - 0.28 ragweed, ige 209  FENO:  No  results found for: NITRICOXIDE  PFT: PFT Results Latest Ref Rng & Units 01/22/2016 07/07/2013  FVC-Pre L 2.97 -  FVC-Predicted Pre % 72 91  FVC-Post L 3.06 3.75  FVC-Predicted Post % 74 89  Pre FEV1/FVC % % 75 74  Post FEV1/FCV % % 77 79  FEV1-Pre L 2.23 2.85  FEV1-Predicted Pre % 76 94  FEV1-Post L 2.37 2.96  DLCO UNC% % 58 64  DLCO COR %Predicted % 82 78  TLC L 5.56 5.94  TLC % Predicted % 77 83  RV % Predicted % 91 81    Imaging: Dg Chest 2 View  Result Date: 12/03/2018 CLINICAL DATA:  85 year old male with shortness of breath EXAM: CHEST - 2 VIEW COMPARISON:  12/16/2017 FINDINGS: Cardiomediastinal silhouette unchanged in size and contour. Unchanged position of left chest wall cardiac pacing device/AICD. Surgical changes of median sternotomy. Increased reticulonodular opacity of the right greater than left lungs, most pronounced at the right lung base. No pleural effusion or pneumothorax. No displaced fracture. IMPRESSION: Reticulonodular opacities of the right greater than left lung, may represent multifocal pneumonia. Unchanged cardiac AICD and changes of prior sternotomy Electronically Signed   By: Corrie Mckusick D.O.   On: 12/03/2018 15:55      Specialty Problems      Pulmonary Problems   Allergic rhinitis    07/07/2013 - Allergy profile - 0.28 ragweed, ige 209       Asthma, moderate persistent    Pulmonary functions on 07/07/2013: FEV1  98% predicted FVC 89% predicted FEV1 to FVC ratio 79% predicted total lung capacity 83% predicted diffusion capacity 64% predicted airway resistance 163% predicted IgE level greater than 200 07/15/13 CT Sinus : sinusitis;   Ct Chest : normal      Sinusitis, chronic   Severe obstructive sleep apnea    12/07/2015- split-night sleep study- AHI 51.5      Restrictive lung disease   Bronchitis   Shortness of breath   Pneumonia of both lower lobes due to infectious organism ([place])    12/03/2018-chest x-ray- reticular nodular opacities in  right greater than left lung, may represent multifocal pneumonia          Allergies  Allergen Reactions  . Codeine Other (See Comments)    anxiety  . Hydrocodone Other (See Comments)    Anxiety- can take in liquid form  . Pseudoephedrine Other (See Comments)    Causes heart to race  . Azithromycin Rash    Immunization History  Administered Date(s) Administered  . Influenza Split 05/05/2013, 06/22/2015  . Influenza Whole 06/03/2011  . Influenza, High Dose Seasonal PF 04/08/2018  . Influenza,inj,quad, With Preservative 05/05/2017  . Influenza-Unspecified 06/01/2016, 06/05/2017  . Pneumococcal Conjugate-13 07/17/2015    Past Medical History:  Diagnosis Date  . Abnormal nuclear cardiac imaging test Nov 2010   moderate area of infarct in the inferior wall with only minimal reversibility and EF of 28%  . AICD (automatic cardioverter/defibrillator) present   . Allergic rhinitis 01-08-13   Uses nebulizer for chronic sinus issues and Mucinex.  . Arthritis    osteoarthritis   . Asthma    Extrinic  . Blood transfusion    ? at time of bypass surgery   . BPH (benign prostatic hyperplasia)   . Cellulitis of arm, left    MSSA  . CHF (congestive heart failure) ([place])   . CKD (chronic kidney disease), stage III ([place])    "was told due to meds he takes"-no Renal workup done  . Colon polyps    adenomatous  . Coronary artery disease    remote CABG in 1985, cath in 2003 by Dr. Lia Foyer with no PCI, last nuclear in 2010 showing scar and EF of 28%.   . Diabetes mellitus   . Diabetic neuropathy ([place]) 04/25/2017  . Dyslipidemia   . Dyspnea   . Gait abnormality 04/25/2017  . Glaucoma 01-08-13   tx. eye drops  . Hemodialysis patient ([place])   . HOH (hard of hearing)   . HTN (hypertension)   . Hypercholesterolemia   . Left ventricular dysfunction    28% per nuclear in 2010 and 35 to 40% per echo in 2010  . Myocardial infarction ([place])    1985  . Neuromuscular disorder ([place])    legs mild  paralysis-able to walk"nerve damage"-legs- left leg brace   . Neuropathy   . Pneumonia    hx of several times years ago   . Spinal stenosis     Tobacco History: Social History   Tobacco Use  Smoking Status Former Smoker  . Packs/day: 1.00  . Years: 10.00  . Pack years: 10.00  . Types: Cigarettes, Pipe, Cigars  . Start date: 12/02/1949  . Last attempt to quit: 08/05/1958  . Years since quitting: 60.4  Smokeless Tobacco Never Used   Counseling given: Not Answered  Continue to not smoke  Outpatient Encounter Medications as of 12/17/2018  Medication Sig  . Albuterol Sulfate (PROAIR RESPICLICK) 322 (90 Base) MCG/ACT AEPB Inhale 1-2  puffs into the lungs every 6 (six) hours as needed.  Marland Kitchen allopurinol (ZYLOPRIM) 100 MG tablet Take 1 tablet (100 mg total) by mouth daily.  Marland Kitchen amiodarone (PACERONE) 200 MG tablet Take 1 tablet (200 mg total) by mouth daily.  Marland Kitchen ammonium lactate (AMLACTIN) 12 % lotion Apply 1 application topically 2 (two) times daily.  Marland Kitchen aspirin EC 81 MG tablet Take 81 mg by mouth daily.   Marland Kitchen atorvastatin (LIPITOR) 10 MG tablet TAKE 1 TABLET DAILY  . B Complex-C-Folic Acid (RENAL-VITE) 0.8 MG TABS Rena-Vite 0.8 mg tablet  . calcitRIOL (ROCALTROL) 0.25 MCG capsule Take 1 capsule (0.25 mcg total) by mouth every Monday, Wednesday, and Friday with hemodialysis.  Marland Kitchen calcium acetate (PHOSLO) 667 MG capsule TAKE 2 CAPSULES BY MOUTH THREE TIMES DAILY WITH MEALS AND 1 TWICE DAILY WITH SNACKS  . cetirizine (ZYRTEC) 10 MG tablet Take 10 mg by mouth at bedtime as needed (seasonal allergies).   . clotrimazole-betamethasone (LOTRISONE) lotion Apply to left foot twice daily  . dicyclomine (BENTYL) 10 MG capsule TAKE 1 CAPSULE BY MOUTH THREE TIMES DAILY AS NEEDED USE SPARINGLY  . diphenhydrAMINE (BENADRYL) 12.5 MG/5ML elixir Take by mouth.  . fluocinonide cream (LIDEX) 2.02 % Apply 1 application topically 2 (two) times daily as needed (for itchy rash).   . fluticasone furoate-vilanterol (BREO  ELLIPTA) 200-25 MCG/INH AEPB Inhale 1 puff into the lungs daily.  Marland Kitchen glipiZIDE (GLUCOTROL XL) 2.5 MG 24 hr tablet Take 5 mg by mouth daily.   Marland Kitchen lidocaine-prilocaine (EMLA) cream Apply 1 application topically daily as needed for painful procedure/intervention.  . lidocaine-prilocaine (EMLA) cream lidocaine-prilocaine 2.5 %-2.5 % topical cream  . multivitamin (RENA-VIT) TABS tablet Take 1 tablet by mouth at bedtime.  . OMEPRAZOLE PO Take 20 mg by mouth daily.  . polyethylene glycol (MIRALAX / GLYCOLAX) packet Take 17 g by mouth daily as needed for mild constipation.  . Polyethylene Glycol 3350 GRAN polyethylene glycol 3350 17 gram oral powder packet  . Syringe/Needle, Disp, (SYRINGE 3CC/22GX1") 22G X 1" 3 ML MISC Use to give injection  . tadalafil (CIALIS) 20 MG tablet Take 20 mg by mouth daily as needed for erectile dysfunction. Do not exceed 2 doses in 7 days  . testosterone cypionate (DEPOTESTOTERONE CYPIONATE) 100 MG/ML injection Inject 400 mg into the muscle every 21 ( twenty-one) days. For IM use only  . Testosterone Cypionate 100 MG/ML SOLN Inject into the muscle.  . Testosterone Cypionate 100 MG/ML SOLN Inject into the muscle.  . triamcinolone (NASACORT AQ) 55 MCG/ACT AERO nasal inhaler Place 2 sprays into the nose daily as needed (seasonal allergies).   . zolpidem (AMBIEN) 10 MG tablet TAKE 1/2 (ONE-HALF) TABLET BY MOUTH AT BEDTIME. TAKE   AN  ADDITIONAL  1/2  TABLET  IF  NEEDED  FOR  SLEEP.  . [DISCONTINUED] amoxicillin-clavulanate (AUGMENTIN) 875-125 MG tablet Take 1 tablet by mouth 2 (two) times daily.  . [DISCONTINUED] fluticasone furoate-vilanterol (BREO ELLIPTA) 200-25 MCG/INH AEPB Inhale 1 puff into the lungs daily.  . [DISCONTINUED] predniSONE (DELTASONE) 10 MG tablet Take 2 tablets (20mg  total) daily for the next 5 days. Take in the AM with food.  Marland Kitchen dextromethorphan-guaiFENesin (MUCINEX DM) 30-600 MG 12hr tablet Take 1 tablet by mouth 2 (two) times daily as needed for cough. (Patient  not taking: Reported on 12/17/2018)   Facility-Administered Encounter Medications as of 12/17/2018  Medication  . omalizumab Arvid Right) injection 300 mg  . omalizumab Arvid Right) injection 300 mg    Review of Systems  Review of Systems  Constitutional: Positive for fatigue. Negative for activity change, appetite change, chills, fever and unexpected weight change.  HENT: Negative for postnasal drip, rhinorrhea, sneezing and sore throat.   Eyes: Negative.   Respiratory: Positive for cough (productive cough with white mucous ) and shortness of breath. Negative for wheezing.   Cardiovascular: Negative for chest pain and palpitations.  Gastrointestinal: Negative for constipation, diarrhea, nausea and vomiting.  Endocrine: Negative.   Musculoskeletal: Negative.   Skin: Negative.   Neurological: Negative for dizziness and headaches.  Psychiatric/Behavioral: Negative.  Negative for dysphoric mood. The patient is not nervous/anxious.   All other systems reviewed and are negative.    Physical Exam  BP (!) 146/70 (BP Location: Left Arm, Cuff Size: Normal)   Pulse 83   Temp 98.2 F (36.8 C)   Ht 5\' 9"  (1.753 m)   Wt 206 lb (93.4 kg)   SpO2 92%   BMI 30.42 kg/m   Wt Readings from Last 5 Encounters:  12/17/18 206 lb (93.4 kg)  12/03/18 207 lb 6.4 oz (94.1 kg)  10/13/18 207 lb 9.6 oz (94.2 kg)  07/08/18 204 lb (92.5 kg)  05/26/18 201 lb (91.2 kg)    Physical Exam  Constitutional: He is oriented to person, place, and time and well-developed, well-nourished, and in no distress. No distress.  HENT:  Head: Normocephalic and atraumatic.  Right Ear: Hearing and external ear normal.  Left Ear: Hearing and external ear normal.  Nose: Nose normal. Right sinus exhibits no maxillary sinus tenderness and no frontal sinus tenderness. Left sinus exhibits no maxillary sinus tenderness and no frontal sinus tenderness.  Mouth/Throat: Uvula is midline and oropharynx is clear and moist. No oropharyngeal  exudate.  HOH    Eyes: Pupils are equal, round, and reactive to light.  Neck: Normal range of motion. Neck supple.  Cardiovascular: Normal rate, regular rhythm and normal heart sounds.  Pulmonary/Chest: Effort normal. No accessory muscle usage. No respiratory distress. He has no decreased breath sounds. He has no wheezes. He has no rhonchi. He has rales (RLL crackles ).  Abdominal: Soft. Bowel sounds are normal. There is no abdominal tenderness.  Musculoskeletal: Normal range of motion.        General: Edema (rue ) present.  Lymphadenopathy:    He has no cervical adenopathy.  Neurological: He is alert and oriented to person, place, and time. Gait normal.  Skin: Skin is warm and dry. He is not diaphoretic. No erythema.  Psychiatric: Mood, memory, affect and judgment normal.  Nursing note and vitals reviewed.     Lab Results:  CBC    Component Value Date/Time   WBC 8.2 12/19/2017 0830   RBC 3.08 (L) 12/19/2017 0830   HGB 8.8 (L) 12/19/2017 0830   HGB 11.5 (L) 10/27/2017 1222   HCT 28.5 (L) 12/19/2017 0830   HCT 36.4 (L) 10/27/2017 1222   PLT 123 (L) 12/19/2017 0830   PLT 100 (LL) 10/27/2017 1222   MCV 92.5 12/19/2017 0830   MCV 95 10/27/2017 1222   MCH 28.6 12/19/2017 0830   MCHC 30.9 12/19/2017 0830   RDW 14.5 12/19/2017 0830   RDW 15.6 (H) 10/27/2017 1222   LYMPHSABS 1.2 12/16/2017 0133   LYMPHSABS 0.6 (L) 10/27/2017 1222   MONOABS 1.1 12/16/2017 0133   EOSABS 0.1 12/16/2017 0133   EOSABS 0.0 10/27/2017 1222   BASOSABS 0.0 12/16/2017 0133   BASOSABS 0.0 10/27/2017 1222    BMET    Component Value Date/Time  NA 135 12/19/2017 0830   NA 136 10/27/2017 1222   K 3.9 12/19/2017 0830   CL 97 (L) 12/19/2017 0830   CO2 25 12/19/2017 0830   GLUCOSE 163 (H) 12/19/2017 0830   BUN 77 (H) 12/19/2017 0830   BUN 109 (HH) 10/27/2017 1222   CREATININE 4.34 (H) 12/19/2017 0830   CREATININE 3.09 (H) 07/01/2016 0907   CALCIUM 8.5 (L) 12/19/2017 0830   GFRNONAA 12 (L)  12/19/2017 0830   GFRNONAA 21 (L) 02/15/2016 1607   GFRAA 13 (L) 12/19/2017 0830   GFRAA 24 (L) 02/15/2016 1607    BNP    Component Value Date/Time   BNP 681.1 (H) 12/16/2017 0133    ProBNP No results found for: PROBNP    Assessment & Plan:   Pneumonia of both lower lobes due to infectious organism [facility]) Assessment: 2-week follow-up Patient treated with Augmentin as well as short course of prednisone Patient reporting clinical improvement Patient with right lower lobe crackles again on exam today  Plan: Follow-up chest x-ray in 4 weeks Follow-up in office in 4 weeks Contact our office sooner if you are having worsened breathing or worsening clinical symptoms   Severe obstructive sleep apnea Assessment: Severe obstructive sleep apnea on split-night sleep study Patient is intolerant of CPAP due to claustrophobia  Plan: Joselyn Arrow ordered at patient's request   Asthma, moderate persistent Assessment: Maintained on Breo Ellipta 200 No audible wheezing on exam today Bibasilar crackles right greater than left Continue Xolair injections every 3 weeks  Plan: Continue Breo Ellipta Continue rescue inhaler Continue Xolair Continue Zyrtec Chest x-ray in 4 weeks with follow-up in 4 weeks   Lauraine Rinne, NP 12/17/2018   This appointment was 26 min long with over 50% of the time in direct face-to-face patient care, assessment, plan of care, and follow-up.

## 2018-12-16 NOTE — Telephone Encounter (Signed)
The patient saw Podiatry yesterday (note is in Epic but is not complete at this time). He is calling to schedule a vascular test. Will route to Dr. Elmarie Shiley nurse to follow-up once note is signed.

## 2018-12-17 ENCOUNTER — Ambulatory Visit (INDEPENDENT_AMBULATORY_CARE_PROVIDER_SITE_OTHER): Payer: Medicare Other | Admitting: Pulmonary Disease

## 2018-12-17 ENCOUNTER — Encounter: Payer: Self-pay | Admitting: Pulmonary Disease

## 2018-12-17 ENCOUNTER — Other Ambulatory Visit: Payer: Self-pay

## 2018-12-17 VITALS — BP 146/70 | HR 83 | Temp 98.2°F | Ht 69.0 in | Wt 206.0 lb

## 2018-12-17 DIAGNOSIS — G4733 Obstructive sleep apnea (adult) (pediatric): Secondary | ICD-10-CM | POA: Diagnosis not present

## 2018-12-17 DIAGNOSIS — J4540 Moderate persistent asthma, uncomplicated: Secondary | ICD-10-CM | POA: Diagnosis not present

## 2018-12-17 DIAGNOSIS — J181 Lobar pneumonia, unspecified organism: Secondary | ICD-10-CM

## 2018-12-17 DIAGNOSIS — J189 Pneumonia, unspecified organism: Secondary | ICD-10-CM | POA: Insufficient documentation

## 2018-12-17 MED ORDER — FLUTICASONE FUROATE-VILANTEROL 200-25 MCG/INH IN AEPB: 1.0000 | INHALATION_SPRAY | Freq: Every day | RESPIRATORY_TRACT | 2 refills | Status: AC

## 2018-12-17 NOTE — Assessment & Plan Note (Signed)
Assessment: Severe obstructive sleep apnea on split-night sleep study Patient is intolerant of CPAP due to claustrophobia  Plan: Shane [name] ordered at patient's request

## 2018-12-17 NOTE — Assessment & Plan Note (Signed)
Assessment: Maintained on Breo Ellipta 200 No audible wheezing on exam today Bibasilar crackles right greater than left Continue Xolair injections every 3 weeks  Plan: Continue Breo Ellipta Continue rescue inhaler Continue Xolair Continue Zyrtec Chest x-ray in 4 weeks with follow-up in 4 weeks

## 2018-12-17 NOTE — Progress Notes (Signed)
Reviewed, agre

## 2018-12-17 NOTE — Assessment & Plan Note (Signed)
Assessment: 2-week follow-up Patient treated with Augmentin as well as short course of prednisone Patient reporting clinical improvement Patient with right lower lobe crackles again on exam today  Plan: Follow-up chest x-ray in 4 weeks Follow-up in office in 4 weeks Contact our office sooner if you are having worsened breathing or worsening clinical symptoms

## 2018-12-17 NOTE — Patient Instructions (Addendum)
Chest Xray in 4 weeks   Breo Ellipta 200 >>> Take 1 puff daily in the morning right when you wake up >>>Rinse your mouth out after use >>>This is a daily maintenance inhaler, NOT a rescue inhaler >>>Contact our office if you are having difficulties affording or obtaining this medication >>>It is important for you to be able to take this daily and not miss any doses  Only use your albuterol as a rescue medication to be used if you can't catch your breath by resting or doing a relaxed purse lip breathing pattern.  - The less you use it, the better it will work when you need it. - Ok to use up to 2 puffs every 4 hours if you must but call for immediate appointment if use goes up over your usual need - Don't leave home without it !! (think of it like the spare tire for your car)    Continue Xolair as prescribed  Continue zyrtec   Will order overnight oximetry for concerns of oxygen levels at night     Return in about 4 weeks (around 01/14/2019), or if symptoms worsen or fail to improve.    Coronavirus (COVID-19) Are you at risk?  Are you at risk for the Coronavirus (COVID-19)?  To be considered HIGH RISK for Coronavirus (COVID-19), you have to meet the following criteria:  . Traveled to [place], [place], [place], [place] or [place]; or in the [place] to [place], [place], [place], or [place]; and have fever, cough, and shortness of breath within the last 2 weeks of travel OR . Been in close contact with a person diagnosed with COVID-19 within the last 2 weeks and have fever, cough, and shortness of breath . IF YOU DO NOT MEET THESE CRITERIA, YOU ARE CONSIDERED LOW RISK FOR COVID-19.  What to do if you are HIGH RISK for COVID-19?  Marland Kitchen If you are having a medical emergency, call 911. . Seek medical care right away. Before you go to a doctor's office, urgent care or emergency department, call ahead and tell them about your recent travel, contact with someone diagnosed  with COVID-19, and your symptoms. You should receive instructions from your physician's office regarding next steps of care.  . When you arrive at healthcare provider, tell the healthcare staff immediately you have returned from visiting [place], [place], [place], [place] or [place]; or traveled in the [place] to [place], [place], [place], or [place]; in the last two weeks or you have been in close contact with a person diagnosed with COVID-19 in the last 2 weeks.   . Tell the health care staff about your symptoms: fever, cough and shortness of breath. . After you have been seen by a medical provider, you will be either: o Tested for (COVID-19) and discharged home on quarantine except to seek medical care if symptoms worsen, and asked to  - Stay home and avoid contact with others until you get your results (4-5 days)  - Avoid travel on public transportation if possible (such as bus, train, or airplane) or o Sent to the Emergency Department by EMS for evaluation, COVID-19 testing, and possible admission depending on your condition and test results.  What to do if you are LOW RISK for COVID-19?  Reduce your risk of any infection by using the same precautions used for avoiding the common cold or flu:  Marland Kitchen Wash your hands often with soap and warm water for at least 20 seconds.  If soap and water are not readily available, use an alcohol-based hand sanitizer with at least 60% alcohol.  . If coughing or sneezing, cover your mouth and nose by coughing or sneezing into the elbow areas of your shirt or coat, into a tissue or into your sleeve (not your hands). . Avoid shaking hands with others and consider head nods or verbal greetings only. . Avoid touching your eyes, nose, or mouth with unwashed hands.  . Avoid close contact with people who are sick. . Avoid places or events with large numbers of people in one location, like concerts or sporting events. . Carefully consider travel plans you have  or are making. . If you are planning any travel outside or inside the [place], visit the CDC's Travelers' Health webpage for the latest health notices. . If you have some symptoms but not all symptoms, continue to monitor at home and seek medical attention if your symptoms worsen. . If you are having a medical emergency, call 911.   [place] / e-Visit: [email or web address]         [facility]: [facility]: [phone]                   [facility]: [phone]           It is flu season:   >>> Best ways to protect herself from the flu: Receive the yearly flu vaccine, practice good hand hygiene washing with soap and also using hand sanitizer when available, eat a nutritious meals, get adequate rest, hydrate appropriately   Please contact the office if your symptoms worsen or you have concerns that you are not improving.   Thank you for choosing [facility] for your healthcare, and for allowing [place] to partner with you on your healthcare journey. I am thankful to be able to provide care to you today.   Wyn Quaker FNP-C

## 2018-12-18 DIAGNOSIS — D509 Iron deficiency anemia, unspecified: Secondary | ICD-10-CM | POA: Diagnosis not present

## 2018-12-18 DIAGNOSIS — N2581 Secondary hyperparathyroidism of renal origin: Secondary | ICD-10-CM | POA: Diagnosis not present

## 2018-12-18 DIAGNOSIS — D631 Anemia in chronic kidney disease: Secondary | ICD-10-CM | POA: Diagnosis not present

## 2018-12-18 DIAGNOSIS — Z23 Encounter for immunization: Secondary | ICD-10-CM | POA: Diagnosis not present

## 2018-12-18 DIAGNOSIS — E1129 Type 2 diabetes mellitus with other diabetic kidney complication: Secondary | ICD-10-CM | POA: Diagnosis not present

## 2018-12-18 DIAGNOSIS — N186 End stage renal disease: Secondary | ICD-10-CM | POA: Diagnosis not present

## 2018-12-21 ENCOUNTER — Other Ambulatory Visit: Payer: Self-pay | Admitting: Cardiovascular Disease

## 2018-12-21 DIAGNOSIS — Z23 Encounter for immunization: Secondary | ICD-10-CM | POA: Diagnosis not present

## 2018-12-21 DIAGNOSIS — D631 Anemia in chronic kidney disease: Secondary | ICD-10-CM | POA: Diagnosis not present

## 2018-12-21 DIAGNOSIS — R0989 Other specified symptoms and signs involving the circulatory and respiratory systems: Secondary | ICD-10-CM

## 2018-12-21 DIAGNOSIS — D509 Iron deficiency anemia, unspecified: Secondary | ICD-10-CM | POA: Diagnosis not present

## 2018-12-21 DIAGNOSIS — N186 End stage renal disease: Secondary | ICD-10-CM | POA: Diagnosis not present

## 2018-12-21 DIAGNOSIS — E1129 Type 2 diabetes mellitus with other diabetic kidney complication: Secondary | ICD-10-CM | POA: Diagnosis not present

## 2018-12-21 DIAGNOSIS — N2581 Secondary hyperparathyroidism of renal origin: Secondary | ICD-10-CM | POA: Diagnosis not present

## 2018-12-21 NOTE — Telephone Encounter (Signed)
Spoke with patient who states he was recommended to have vascular studies by the podiatrist and he would like Dr. Acie Fredrickson to order them. He is also due for his 2 year repeat carotid duplex. He states his pulse in his left foot is lower than right foot. I advised that per Dr. Acie Fredrickson, we will order lower extremity u/s and ABI. He states he has hemodialysis on  Mon, Wed, Fri - can do afternoon apointment or he can have appointment on Tuesdays or Thursdays. I advised that we will schedule follow-up to be done after the tests and he thanked me for the call.

## 2018-12-21 NOTE — Telephone Encounter (Signed)
Called patient who requests that I call him back after dialysis. I advised I will call back later this afternoon.

## 2018-12-21 NOTE — Telephone Encounter (Signed)
Follow up   Patient is calling to see if Dr. Acie Fredrickson will order his vascular studies test. Please call to discuss

## 2018-12-22 ENCOUNTER — Encounter: Payer: Self-pay | Admitting: Podiatry

## 2018-12-22 NOTE — Progress Notes (Signed)
Subjective: Patient presents today with preventative diabetic foot care.  Patient is accompanied by his wife on today.  Patient is concerned about his circulation of the left lower extremity. Patient believes his left lower extremity has gotten cooler.   Mycotic toenails: Aggravated when wearing enclosed shoe gear. Pain is getting progressively worse and relieved with periodic professional debridement.  Patient also states that hydrocodone cortisone ointment does not appear to be working.  He is also using AmLactin lotion.  [name], MD is his PCP.   Current Outpatient Medications:  .  Albuterol Sulfate (PROAIR RESPICLICK) 811 (90 Base) MCG/ACT AEPB, Inhale 1-2 puffs into the lungs every 6 (six) hours as needed., Disp: 1 each, Rfl: 5 .  allopurinol (ZYLOPRIM) 100 MG tablet, Take 1 tablet (100 mg total) by mouth daily., Disp: 90 tablet, Rfl: 1 .  amiodarone (PACERONE) 200 MG tablet, Take 1 tablet (200 mg total) by mouth daily., Disp: 90 tablet, Rfl: 2 .  ammonium lactate (AMLACTIN) 12 % lotion, Apply 1 application topically 2 (two) times daily., Disp: 400 g, Rfl: 4 .  aspirin EC 81 MG tablet, Take 81 mg by mouth daily. , Disp: , Rfl:  .  atorvastatin (LIPITOR) 10 MG tablet, TAKE 1 TABLET DAILY, Disp: 90 tablet, Rfl: 1 .  B Complex-C-Folic Acid (RENAL-VITE) 0.8 MG TABS, Rena-Vite 0.8 mg tablet, Disp: , Rfl:  .  calcitRIOL (ROCALTROL) 0.25 MCG capsule, Take 1 capsule (0.25 mcg total) by mouth every Monday, Wednesday, and Friday with hemodialysis., Disp: 60 capsule, Rfl: 0 .  calcium acetate (PHOSLO) 667 MG capsule, TAKE 2 CAPSULES BY MOUTH THREE TIMES DAILY WITH MEALS AND 1 TWICE DAILY WITH SNACKS, Disp: , Rfl:  .  cetirizine (ZYRTEC) 10 MG tablet, Take 10 mg by mouth at bedtime as needed (seasonal allergies). , Disp: , Rfl:  .  clotrimazole-betamethasone (LOTRISONE) lotion, Apply to left foot twice daily, Disp: 30 mL, Rfl: 1 .  dextromethorphan-guaiFENesin (MUCINEX DM) 30-600 MG 12hr  tablet, Take 1 tablet by mouth 2 (two) times daily as needed for cough. (Patient not taking: Reported on 12/17/2018), Disp: 20 tablet, Rfl: 0 .  dicyclomine (BENTYL) 10 MG capsule, TAKE 1 CAPSULE BY MOUTH THREE TIMES DAILY AS NEEDED USE SPARINGLY, Disp: , Rfl: 3 .  diphenhydrAMINE (BENADRYL) 12.5 MG/5ML elixir, Take by mouth., Disp: , Rfl:  .  fluocinonide cream (LIDEX) 5.72 %, Apply 1 application topically 2 (two) times daily as needed (for itchy rash). , Disp: , Rfl:  .  fluticasone furoate-vilanterol (BREO ELLIPTA) 200-25 MCG/INH AEPB, Inhale 1 puff into the lungs daily., Disp: 180 each, Rfl: 2 .  glipiZIDE (GLUCOTROL XL) 2.5 MG 24 hr tablet, Take 5 mg by mouth daily. , Disp: , Rfl: 6 .  lidocaine-prilocaine (EMLA) cream, Apply 1 application topically daily as needed for painful procedure/intervention., Disp: , Rfl: 11 .  lidocaine-prilocaine (EMLA) cream, lidocaine-prilocaine 2.5 %-2.5 % topical cream, Disp: , Rfl:  .  multivitamin (RENA-VIT) TABS tablet, Take 1 tablet by mouth at bedtime., Disp: 60 tablet, Rfl: 0 .  OMEPRAZOLE PO, Take 20 mg by mouth daily., Disp: , Rfl:  .  polyethylene glycol (MIRALAX / GLYCOLAX) packet, Take 17 g by mouth daily as needed for mild constipation., Disp: 14 each, Rfl: 0 .  Polyethylene Glycol 3350 GRAN, polyethylene glycol 3350 17 gram oral powder packet, Disp: , Rfl:  .  Syringe/Needle, Disp, (SYRINGE 3CC/22GX1") 22G X 1" 3 ML MISC, Use to give injection, Disp: , Rfl:  .  tadalafil (  CIALIS) 20 MG tablet, Take 20 mg by mouth daily as needed for erectile dysfunction. Do not exceed 2 doses in 7 days, Disp: , Rfl:  .  testosterone cypionate (DEPOTESTOTERONE CYPIONATE) 100 MG/ML injection, Inject 400 mg into the muscle every 21 ( twenty-one) days. For IM use only, Disp: , Rfl:  .  Testosterone Cypionate 100 MG/ML SOLN, Inject into the muscle., Disp: , Rfl:  .  Testosterone Cypionate 100 MG/ML SOLN, Inject into the muscle., Disp: , Rfl:  .  triamcinolone (NASACORT AQ)  55 MCG/ACT AERO nasal inhaler, Place 2 sprays into the nose daily as needed (seasonal allergies). , Disp: , Rfl:  .  zolpidem (AMBIEN) 10 MG tablet, TAKE 1/2 (ONE-HALF) TABLET BY MOUTH AT BEDTIME. TAKE   AN  ADDITIONAL  1/2  TABLET  IF  NEEDED  FOR  SLEEP., Disp: 30 tablet, Rfl: 0  Current Facility-Administered Medications:  .  omalizumab (XOLAIR) injection 300 mg, 300 mg, Subcutaneous, Q14 Days, McQuaid, Douglas B, MD, 300 mg at 09/29/18 1015 .  omalizumab Arvid Right) injection 300 mg, 300 mg, Subcutaneous, Once, Juanito Doom, MD   Allergies  Allergen Reactions  . Codeine Other (See Comments)    anxiety  . Hydrocodone Other (See Comments)    Anxiety- can take in liquid form  . Pseudoephedrine Other (See Comments)    Causes heart to race  . Azithromycin Rash     Objective:  Vascular Examination: Capillary refill time immediate x 10 digits.  Dorsalis pedis pulses 1/4 right, diminished left foot.  Posterior tibial pulses nonpalpable bilaterally.  Digital hair absent x 10 digits.  Skin temperature gradient warm to cool b/l.  No ischemic changes noted on today's visit.  Dermatological Examination: Skin with age-related atrophy bilaterally.  No open wounds bilaterally.  No interdigital macerations noted bilaterally.  Toenails 1-5 b/l discolored, thick, dystrophic with subungual debris and pain with palpation to nailbeds due to thickness of nails.  Diffuse scaling noted peripherally and plantarly b/l feet with mild foot odor.  No interdigital macerations.  No blisters, no weeping. No signs of secondary bacterial infection noted.  Musculoskeletal: Muscle strength 5/5 to all LE muscle groups.  Neurological: Sensation intact with 10 gram monofilament.  Vibratory sensation intact.  Assessment: 1. Painful onychomycosis toenails 1-5 b/l 2. Tinea pedis bilaterally 3. NIDDM with Peripheral arterial disease  Plan: 1. Regarding circulation,  he has no tissue loss on  today.  No ischemic changes, no gangrene.  Prescription given to patient for ABIs to be performed bilateral lower extremities with toe pressures.  Patient and wife were advised should he experience any changes in the color of his digits, abnormal cooling or increased pain, he should report to the emergency department immediately for work-up.  They related understanding. 2. Toenails 1-5 b/l were debrided in length and girth without iatrogenic bleeding. 3. For tinea pedis, prescription written for Lotrisone lotion to be applied to both feet twice daily. 4. Patient to continue soft, supportive shoe gear daily. 5. He may continue to use AmLactin lotion for daily moisturizing. 6. Patient to report any pedal injuries to medical professional immediately. 7. Follow up 10 weeks for at diabetic foot care.  8.  Patient/POA to call should there be a concern in the interim.

## 2018-12-23 DIAGNOSIS — Z23 Encounter for immunization: Secondary | ICD-10-CM | POA: Diagnosis not present

## 2018-12-23 DIAGNOSIS — D509 Iron deficiency anemia, unspecified: Secondary | ICD-10-CM | POA: Diagnosis not present

## 2018-12-23 DIAGNOSIS — N186 End stage renal disease: Secondary | ICD-10-CM | POA: Diagnosis not present

## 2018-12-23 DIAGNOSIS — D631 Anemia in chronic kidney disease: Secondary | ICD-10-CM | POA: Diagnosis not present

## 2018-12-23 DIAGNOSIS — E1129 Type 2 diabetes mellitus with other diabetic kidney complication: Secondary | ICD-10-CM | POA: Diagnosis not present

## 2018-12-23 DIAGNOSIS — N2581 Secondary hyperparathyroidism of renal origin: Secondary | ICD-10-CM | POA: Diagnosis not present

## 2018-12-25 DIAGNOSIS — Z23 Encounter for immunization: Secondary | ICD-10-CM | POA: Diagnosis not present

## 2018-12-25 DIAGNOSIS — N2581 Secondary hyperparathyroidism of renal origin: Secondary | ICD-10-CM | POA: Diagnosis not present

## 2018-12-25 DIAGNOSIS — D509 Iron deficiency anemia, unspecified: Secondary | ICD-10-CM | POA: Diagnosis not present

## 2018-12-25 DIAGNOSIS — E1129 Type 2 diabetes mellitus with other diabetic kidney complication: Secondary | ICD-10-CM | POA: Diagnosis not present

## 2018-12-25 DIAGNOSIS — N186 End stage renal disease: Secondary | ICD-10-CM | POA: Diagnosis not present

## 2018-12-25 DIAGNOSIS — D631 Anemia in chronic kidney disease: Secondary | ICD-10-CM | POA: Diagnosis not present

## 2018-12-28 DIAGNOSIS — D509 Iron deficiency anemia, unspecified: Secondary | ICD-10-CM | POA: Diagnosis not present

## 2018-12-28 DIAGNOSIS — D631 Anemia in chronic kidney disease: Secondary | ICD-10-CM | POA: Diagnosis not present

## 2018-12-28 DIAGNOSIS — E1129 Type 2 diabetes mellitus with other diabetic kidney complication: Secondary | ICD-10-CM | POA: Diagnosis not present

## 2018-12-28 DIAGNOSIS — Z23 Encounter for immunization: Secondary | ICD-10-CM | POA: Diagnosis not present

## 2018-12-28 DIAGNOSIS — N2581 Secondary hyperparathyroidism of renal origin: Secondary | ICD-10-CM | POA: Diagnosis not present

## 2018-12-28 DIAGNOSIS — N186 End stage renal disease: Secondary | ICD-10-CM | POA: Diagnosis not present

## 2018-12-29 ENCOUNTER — Ambulatory Visit (INDEPENDENT_AMBULATORY_CARE_PROVIDER_SITE_OTHER): Payer: Medicare Other

## 2018-12-29 ENCOUNTER — Encounter: Payer: Self-pay | Admitting: Acute Care

## 2018-12-29 ENCOUNTER — Telehealth: Payer: Self-pay | Admitting: Hematology

## 2018-12-29 ENCOUNTER — Other Ambulatory Visit: Payer: Self-pay | Admitting: Acute Care

## 2018-12-29 ENCOUNTER — Telehealth: Payer: Self-pay | Admitting: Acute Care

## 2018-12-29 ENCOUNTER — Ambulatory Visit (INDEPENDENT_AMBULATORY_CARE_PROVIDER_SITE_OTHER): Payer: Medicare Other | Admitting: Acute Care

## 2018-12-29 ENCOUNTER — Ambulatory Visit: Payer: Medicare Other

## 2018-12-29 ENCOUNTER — Other Ambulatory Visit: Payer: Self-pay

## 2018-12-29 ENCOUNTER — Telehealth: Payer: Self-pay

## 2018-12-29 ENCOUNTER — Other Ambulatory Visit: Payer: Medicare Other

## 2018-12-29 ENCOUNTER — Telehealth: Payer: Self-pay | Admitting: Pulmonary Disease

## 2018-12-29 VITALS — BP 160/80 | HR 95 | Temp 98.4°F | Ht 69.0 in | Wt 203.8 lb

## 2018-12-29 DIAGNOSIS — R0602 Shortness of breath: Secondary | ICD-10-CM | POA: Diagnosis not present

## 2018-12-29 DIAGNOSIS — I255 Ischemic cardiomyopathy: Secondary | ICD-10-CM

## 2018-12-29 DIAGNOSIS — J181 Lobar pneumonia, unspecified organism: Secondary | ICD-10-CM

## 2018-12-29 DIAGNOSIS — G4733 Obstructive sleep apnea (adult) (pediatric): Secondary | ICD-10-CM | POA: Diagnosis not present

## 2018-12-29 DIAGNOSIS — Z20822 Contact with and (suspected) exposure to covid-19: Secondary | ICD-10-CM

## 2018-12-29 DIAGNOSIS — J4540 Moderate persistent asthma, uncomplicated: Secondary | ICD-10-CM | POA: Diagnosis not present

## 2018-12-29 DIAGNOSIS — R6889 Other general symptoms and signs: Secondary | ICD-10-CM | POA: Diagnosis not present

## 2018-12-29 DIAGNOSIS — J189 Pneumonia, unspecified organism: Secondary | ICD-10-CM

## 2018-12-29 LAB — CBC WITH DIFFERENTIAL/PLATELET
Basophils Absolute: 0 10*3/uL (ref 0.0–0.1)
Basophils Relative: 0.4 % (ref 0.0–3.0)
Eosinophils Absolute: 0.1 10*3/uL (ref 0.0–0.7)
Eosinophils Relative: 2 % (ref 0.0–5.0)
HCT: 30.8 % — ABNORMAL LOW (ref 39.0–52.0)
Hemoglobin: 10.3 g/dL — ABNORMAL LOW (ref 13.0–17.0)
Lymphocytes Relative: 14.8 % (ref 12.0–46.0)
Lymphs Abs: 0.6 10*3/uL — ABNORMAL LOW (ref 0.7–4.0)
MCHC: 33.5 g/dL (ref 30.0–36.0)
MCV: 98.7 fl (ref 78.0–100.0)
Monocytes Absolute: 0.4 10*3/uL (ref 0.1–1.0)
Monocytes Relative: 10.8 % (ref 3.0–12.0)
Neutro Abs: 2.9 10*3/uL (ref 1.4–7.7)
Neutrophils Relative %: 72 % (ref 43.0–77.0)
Platelets: 81 10*3/uL — ABNORMAL LOW (ref 150.0–400.0)
RBC: 3.12 Mil/uL — ABNORMAL LOW (ref 4.22–5.81)
RDW: 17 % — ABNORMAL HIGH (ref 11.5–15.5)
WBC: 4 10*3/uL (ref 4.0–10.5)

## 2018-12-29 LAB — D-DIMER, QUANTITATIVE: D-Dimer, Quant: 2.45 mcg/mL FEU — ABNORMAL HIGH (ref <0.50)

## 2018-12-29 LAB — BRAIN NATRIURETIC PEPTIDE: Pro B Natriuretic peptide (BNP): 2417 pg/mL — ABNORMAL HIGH (ref 0.0–100.0)

## 2018-12-29 MED ORDER — OMALIZUMAB 150 MG ~~LOC~~ SOLR
150.0000 mg | Freq: Once | SUBCUTANEOUS | Status: AC
  Administered 2018-12-29: 16:00:00 300 mg via SUBCUTANEOUS

## 2018-12-29 MED ORDER — ALBUTEROL SULFATE (2.5 MG/3ML) 0.083% IN NEBU: 2.5000 mg | INHALATION_SOLUTION | Freq: Four times a day (QID) | RESPIRATORY_TRACT | 3 refills | Status: AC | PRN

## 2018-12-29 MED ORDER — DOXYCYCLINE HYCLATE 100 MG PO TABS: 100.0000 mg | ORAL_TABLET | Freq: Two times a day (BID) | ORAL | 0 refills | Status: DC

## 2018-12-29 NOTE — Telephone Encounter (Signed)
SG please see below. The COVID test has been ordered and scheduled.

## 2018-12-29 NOTE — Assessment & Plan Note (Signed)
Compliant with Breo daily May benefit from nebulized maintenance medications Plan Plan: Continue Breo Ellipta one puff once daily Rinse mouth after use Continue rescue inhaler as needed for breakthrough shortness of breath Continue Xolair injections Continue Zyrtec daily to help control your allergies/ triggers Please contact office for sooner follow up if symptoms do not improve or worsen or seek emergency care

## 2018-12-29 NOTE — Telephone Encounter (Signed)
Patient needs to be tested for COVID-19 Pt was in office today for appointment.

## 2018-12-29 NOTE — Telephone Encounter (Signed)
-----   Message from [name], MD sent at 12/28/2018 10:01 AM EDT ----- Remote reviewed. This remote is abnormal for decreased intrathoracic impedance-- could we call him and see how he is doing w dyspnea and weight  L  With His Cr elevation, can we clarify his close followup with renal  Thanks  SK

## 2018-12-29 NOTE — Progress Notes (Signed)
Have you been hospitalized within the last 10 days?  No Do you have a fever?  No Do you have a cough?  Yes Still coughing up white mucus and SOB. Pt is going to see SG today also. Do you have a headache or sore throat? No

## 2018-12-29 NOTE — Telephone Encounter (Signed)
This has been ordered and scheduled.

## 2018-12-29 NOTE — Telephone Encounter (Signed)
Spoke with patient. He stated that he is still having issues with SOB. He is using his Breo 200 and albuterol inhalers which are helping, but only for short periods of time.   He wants to know if he could have a prescription for a nebulizer as well as the medication. He stated that he had a breathing treatment in the office for a previous episode and this helped him greatly. I looked in his chart and did not see what neb medication he had used in the office. Advised him I would ask Aaron Edelman. He verbalized understanding.   He is also not established with a DME.   Aaron Edelman, please advise. Thanks!

## 2018-12-29 NOTE — Patient Instructions (Signed)
Somehow created 2 Xolair encounters in [facility] deleted 72 units. Time 15:34 (tbs)

## 2018-12-29 NOTE — Assessment & Plan Note (Signed)
Some improvement per CXR after Augmentin and prednisone taper CXR read as improved, but clinically had dense rhonchi and rales per RLL on exam Denies any choking on food, but will need to consider swallow eval if her continues to have  Plan Continue Mucinex cough medication as you have been doing. We will send in a prescription for Doxycycline 100 mg twice daily. Take with full glass of water. Follow up in 1 week to assess response to treatment Please contact office for sooner follow up if symptoms do not improve or worsen or seek emergency care

## 2018-12-29 NOTE — Telephone Encounter (Signed)
LVM for return call to discuss remote check recommendations.

## 2018-12-29 NOTE — Telephone Encounter (Signed)
Left message for patient to call back. If patient hasn't called back before his injection, will try to catch him while he is in the office.

## 2018-12-29 NOTE — Patient Instructions (Addendum)
It is good to see you today. We will culture your sputum.( AFB, culture and fungal) We will do some labs( a d dimer and BNP, CBC). We will place an order for a nebulizer machine. We will place an order for nebulized albuterol. Use albuterol nebs twice daily while sick Continue Breo 1 puff once daily Rinse mouth after use. We will walk you today to make sure you are not dropping your oxygen levels.>> you did not drop below 93%. We will consider changing you maintenance medications to nebulized once you are better. Continue Mucinex cough medication as you have been doing. We will send in a prescription for Doxycycline 100 mg twice daily. Take with full glass of water. Please monitor your BP as it is a bit high today. We will order an Overnight oximetry We will test you for COVID 19 , just to make sure this is not the cause of your shortness of breath.. Follow up in 1 week with Judson Roch NP or Wyn Quaker NP Please contact office for sooner follow up if symptoms do not improve or worsen or seek emergency care

## 2018-12-29 NOTE — Telephone Encounter (Signed)
COVID screening scheduled for today at 3:30.  Orders placed.

## 2018-12-29 NOTE — Telephone Encounter (Signed)
Spoke with pt. He states he has not noticed any weight gain but his does have constant SOB. Pt currently on hemodialysis and believes he is dialyzed to his dry weight normally. However, he would like Dr Caryl Comes to contact Dr Archie Balboa to discuss increase in thoracic impedence given his frequent SOB.

## 2018-12-29 NOTE — Assessment & Plan Note (Addendum)
Unable to tolerate CPAP AHI was > 50 Concern untreated OSA may be contributing to dyspnea/ ? [place] Plan O&O as he may benefit from nocturnal oxygen Consider Echo Consider Cath

## 2018-12-29 NOTE — Telephone Encounter (Signed)
Unfortunately it is not that simple.  Im sorry the patient is not feeling well.  Is the patient's weight up?  Is he wheezing?  Has he continued on with dialysis or missed any dialysis appointments?  Is he at his dry weight?  Patient has multiple other chronic comorbidities which could be contributing to his shortness of breath which will need to be evaluated.  How often is he using his albuterol?  Wyn Quaker, FNP

## 2018-12-29 NOTE — Progress Notes (Signed)
History of Present Illness Shane [name] is a 85 y.o. male remote smoker with asthma. He is followed by Dr. Lake Bells. He has been receiving Xolair injections . He has ESRD and has HD 3 times weekly.Dry weight is 36 KG  85 year old male, former smoker quit in 1960 (5 pack year hx). Followed in our office for Asthma.   PMH: significant formoderate persistentasthma, DM type 2 with renal complications, MWF HD Smoker/ Smoking History: Former Smoker. 1960. 5 pack year history.   Maintenance:  Breo 200, Xolair q3 weeks Pt of: Dr. Lake Bells  12/29/2018  Acute OV: Pt. Presents for follow up. He was scheduled for a Xolair injection today, and came in with complaints of shortness of breath. He was requesting a nebulizer machine. Pt. Was scheduled for an acute visit as he has a complicated medical,history and was recently treated for pneumonia with Augmentin on 12/03/2018.He states he was getting better when he completed his Augmentin and prednisone taper. He states he started getting worse about 3 weeks ago. She states at this time he states he is so short of breath he has a hard time with minimal exertion. He states he is still going to HD but does not have the energy to do anything else.He does have a productive cough. Sputum is white.He states he coughs up secretions in the morning. He denies a fever.He denies chills. He states his dry weight is 92 KG.  His weight today is 92.4 Kg.  Test Results: 08/26/2017-chest x-ray-no active cardiopulmonary disease  12/03/2018-chest x-ray- reticular nodular opacities in right greater than left lung, may represent multifocal pneumonia  12/29/2018 CXR No cardiopulmonary disease  01/01/2018-echocardiogram-LV ejection fraction 40 to 99%, grade 1 diastolic dysfunction, mild mitral regurgitation  12/07/2015- split-night sleep study- AHI 51.5  07/07/2013 - Allergy profile - 0.28 ragweed, ige 209  Allergic rhinitis     07/07/2013 - Allergy profile - 0.28 ragweed,  ige 209       Asthma, moderate persistent    Pulmonary functions on 07/07/2013: FEV1 98% predicted FVC 89% predicted FEV1 to FVC ratio 79% predicted total lung capacity 83% predicted diffusion capacity 64% predicted airway resistance 163% predicted IgE level greater than 200 07/15/13 CT Sinus : sinusitis;   Ct Chest : normal      Sinusitis, chronic   Severe obstructive sleep apnea    12/07/2015- split-night sleep study- AHI 51.5      Restrictive lung disease   Bronchitis   Shortness of breath   Pneumonia of both lower lobes due to infectious organism ([place])    12/03/2018-chest x-ray- reticular nodular opacities in right greater than left lung, may represent multifocal pneumonia            CBC Latest Ref Rng & Units 12/29/2018 12/19/2017 12/18/2017  WBC 4.0 - 10.5 K/uL 4.0 8.2 7.1  Hemoglobin 13.0 - 17.0 g/dL 10.3(L) 8.8(L) 9.7(L)  Hematocrit 39.0 - 52.0 % 30.8(L) 28.5(L) 31.7(L)  Platelets 150.0 - 400.0 K/uL 81.0(L) 123(L) 144(L)    BMP Latest Ref Rng & Units 12/19/2017 12/18/2017 12/17/2017  Glucose 65 - 99 mg/dL 163(H) 139(H) 190(H)  BUN 6 - 20 mg/dL 77(H) 45(H) 46(H)  Creatinine 0.61 - 1.24 mg/dL 4.34(H) 3.32(H) 3.45(H)  BUN/Creat Ratio 10 - 24 - - -  Sodium 135 - 145 mmol/L 135 138 134(L)  Potassium 3.5 - 5.1 mmol/L 3.9 4.3 4.0  Chloride 101 - 111 mmol/L 97(L) 100(L) 97(L)  CO2 22 - 32 mmol/L 25 23 24   Calcium  8.9 - 10.3 mg/dL 8.5(L) 8.5(L) 8.4(L)    BNP    Component Value Date/Time   BNP 681.1 (H) 12/16/2017 0133    ProBNP    Component Value Date/Time   PROBNP 2,417.0 (H) 12/29/2018 1428    PFT    Component Value Date/Time   FEV1PRE 2.23 01/22/2016 1251   FEV1POST 2.37 01/22/2016 1251   FVCPRE 2.97 01/22/2016 1251   FVCPOST 3.06 01/22/2016 1251   TLC 5.56 01/22/2016 1251   DLCOUNC 19.27 01/22/2016 1251   PREFEV1FVCRT 75 01/22/2016 1251   PSTFEV1FVCRT 77 01/22/2016 1251    Dg Chest 2 View  Result Date: 12/29/2018 CLINICAL DATA:   Shortness of breath. EXAM: CHEST - 2 VIEW COMPARISON:  Chest x-ray dated December 03, 2018. FINDINGS: Unchanged left chest wall pacemaker. Stable cardiomediastinal silhouette. Normal pulmonary vascularity. Chronic changes at the lung bases with elevation of the right hemidiaphragm. No focal consolidation, pleural effusion, or pneumothorax. No acute osseous abnormality. IMPRESSION: No active cardiopulmonary disease. Electronically Signed   By: Titus Dubin M.D.   On: 12/29/2018 12:40   Dg Chest 2 View  Result Date: 12/03/2018 CLINICAL DATA:  85 year old male with shortness of breath EXAM: CHEST - 2 VIEW COMPARISON:  12/16/2017 FINDINGS: Cardiomediastinal silhouette unchanged in size and contour. Unchanged position of left chest wall cardiac pacing device/AICD. Surgical changes of median sternotomy. Increased reticulonodular opacity of the right greater than left lungs, most pronounced at the right lung base. No pleural effusion or pneumothorax. No displaced fracture. IMPRESSION: Reticulonodular opacities of the right greater than left lung, may represent multifocal pneumonia. Unchanged cardiac AICD and changes of prior sternotomy Electronically Signed   By: Corrie Mckusick D.O.   On: 12/03/2018 15:55     Past medical hx Past Medical History:  Diagnosis Date   Abnormal nuclear cardiac imaging test Nov 2010   moderate area of infarct in the inferior wall with only minimal reversibility and EF of 28%   AICD (automatic cardioverter/defibrillator) present    Allergic rhinitis 01-08-13   Uses nebulizer for chronic sinus issues and Mucinex.   Arthritis    osteoarthritis    Asthma    Extrinic   Blood transfusion    ? at time of bypass surgery    BPH (benign prostatic hyperplasia)    Cellulitis of arm, left    MSSA   CHF (congestive heart failure) ([place])    CKD (chronic kidney disease), stage III ([place])    "was told due to meds he takes"-no Renal workup done   Colon polyps    adenomatous    Coronary artery disease    remote CABG in 1985, cath in 2003 by Dr. Lia Foyer with no PCI, last nuclear in 2010 showing scar and EF of 28%.    Diabetes mellitus    Diabetic neuropathy ([place]) 04/25/2017   Dyslipidemia    Dyspnea    Gait abnormality 04/25/2017   Glaucoma 01-08-13   tx. eye drops   Hemodialysis patient ([place])    [place] (hard of hearing)    HTN (hypertension)    Hypercholesterolemia    Left ventricular dysfunction    28% per nuclear in 2010 and 35 to 40% per echo in 2010   Myocardial infarction [facility])    1985   Neuromuscular disorder ([place])    legs mild paralysis-able to walk"nerve damage"-legs- left leg brace    Neuropathy    Pneumonia    hx of several times years ago    Spinal stenosis  Social History   Tobacco Use   Smoking status: Former Smoker    Packs/day: 1.00    Years: 10.00    Pack years: 10.00    Types: Cigarettes, Pipe, Cigars    Start date: 12/02/1949    Last attempt to quit: 08/05/1958    Years since quitting: 60.4   Smokeless tobacco: Never Used  Substance Use Topics   Alcohol use: No   Drug use: No    Shane [name] reports that he quit smoking about 60 years ago. His smoking use included cigarettes, pipe, and cigars. He started smoking about 69 years ago. He has a 10.00 pack-year smoking history. He has never used smokeless tobacco. He reports that he does not drink alcohol or use drugs.  Tobacco Cessation: Former smoker quit 1960 with a 10 pack year smoking history  Past surgical hx, Family hx, Social hx all reviewed.  Current Outpatient Medications on File Prior to Visit  Medication Sig   Albuterol Sulfate (PROAIR RESPICLICK) 672 (90 Base) MCG/ACT AEPB Inhale 1-2 puffs into the lungs every 6 (six) hours as needed.   allopurinol (ZYLOPRIM) 100 MG tablet Take 1 tablet (100 mg total) by mouth daily.   amiodarone (PACERONE) 200 MG tablet Take 1 tablet (200 mg total) by mouth daily.   ammonium lactate (AMLACTIN) 12 % lotion  Apply 1 application topically 2 (two) times daily.   aspirin EC 81 MG tablet Take 81 mg by mouth daily.    atorvastatin (LIPITOR) 10 MG tablet TAKE 1 TABLET DAILY   B Complex-C-Folic Acid (RENAL-VITE) 0.8 MG TABS Rena-Vite 0.8 mg tablet   calcitRIOL (ROCALTROL) 0.25 MCG capsule Take 1 capsule (0.25 mcg total) by mouth every Monday, Wednesday, and Friday with hemodialysis.   calcium acetate (PHOSLO) 667 MG capsule TAKE 2 CAPSULES BY MOUTH THREE TIMES DAILY WITH MEALS AND 1 TWICE DAILY WITH SNACKS   cetirizine (ZYRTEC) 10 MG tablet Take 10 mg by mouth at bedtime as needed (seasonal allergies).    clotrimazole-betamethasone (LOTRISONE) lotion Apply to left foot twice daily   dextromethorphan-guaiFENesin (MUCINEX DM) 30-600 MG 12hr tablet Take 1 tablet by mouth 2 (two) times daily as needed for cough.   dicyclomine (BENTYL) 10 MG capsule TAKE 1 CAPSULE BY MOUTH THREE TIMES DAILY AS NEEDED USE SPARINGLY   diphenhydrAMINE (BENADRYL) 12.5 MG/5ML elixir Take by mouth.   fluocinonide cream (LIDEX) 0.94 % Apply 1 application topically 2 (two) times daily as needed (for itchy rash).    fluticasone furoate-vilanterol (BREO ELLIPTA) 200-25 MCG/INH AEPB Inhale 1 puff into the lungs daily.   glipiZIDE (GLUCOTROL XL) 2.5 MG 24 hr tablet Take 5 mg by mouth daily.    lidocaine-prilocaine (EMLA) cream Apply 1 application topically daily as needed for painful procedure/intervention.   lidocaine-prilocaine (EMLA) cream lidocaine-prilocaine 2.5 %-2.5 % topical cream   multivitamin (RENA-VIT) TABS tablet Take 1 tablet by mouth at bedtime.   OMEPRAZOLE PO Take 20 mg by mouth daily.   polyethylene glycol (MIRALAX / GLYCOLAX) packet Take 17 g by mouth daily as needed for mild constipation.   Polyethylene Glycol 3350 GRAN polyethylene glycol 3350 17 gram oral powder packet   Syringe/Needle, Disp, (SYRINGE 3CC/22GX1") 22G X 1" 3 ML MISC Use to give injection   tadalafil (CIALIS) 20 MG tablet Take 20 mg  by mouth daily as needed for erectile dysfunction. Do not exceed 2 doses in 7 days   testosterone cypionate (DEPOTESTOTERONE CYPIONATE) 100 MG/ML injection Inject 400 mg into the muscle every 21 ( twenty-one) days.  For IM use only   Testosterone Cypionate 100 MG/ML SOLN Inject into the muscle.   Testosterone Cypionate 100 MG/ML SOLN Inject into the muscle.   triamcinolone (NASACORT AQ) 55 MCG/ACT AERO nasal inhaler Place 2 sprays into the nose daily as needed (seasonal allergies).    zolpidem (AMBIEN) 10 MG tablet TAKE 1/2 (ONE-HALF) TABLET BY MOUTH AT BEDTIME. TAKE   AN  ADDITIONAL  1/2  TABLET  IF  NEEDED  FOR  SLEEP.   Current Facility-Administered Medications on File Prior to Visit  Medication   omalizumab Arvid Right) injection 300 mg   omalizumab Arvid Right) injection 300 mg     Allergies  Allergen Reactions   Codeine Other (See Comments)    anxiety   Hydrocodone Other (See Comments)    Anxiety- can take in liquid form   Pseudoephedrine Other (See Comments)    Causes heart to race   Azithromycin Rash    Review Of Systems:  Constitutional:   No  weight loss, night sweats,  Fevers, chills, + fatigue, or  lassitude.  HEENT:   No headaches,  Difficulty swallowing,  Tooth/dental problems, or  Sore throat,                No sneezing, itching, ear ache, no nasal congestion, no post nasal drip,   CV:  No chest pain,  Orthopnea, PND, swelling in lower extremities, anasarca, dizziness, palpitations, syncope.   GI  No heartburn, indigestion, abdominal pain, nausea, vomiting, diarrhea, change in bowel habits, loss of appetite, bloody stools.   Resp: + shortness of breath with exertion less at rest.  No excess mucus, no productive cough,  No  non-productive cough,  No coughing up of blood.  No change in color of mucus.  No  wheezing.  No chest wall deformity  Skin: no rash or lesions.Intact, warm and dry  GU: no dysuria, change in color of urine, no urgency or frequency.  No flank  pain, no hematuria   MS:  No joint pain or swelling. No decreased range of motion.  No back pain.  Psych:  No change in mood or affect. No depression or anxiety.  No memory loss.   Vital Signs BP (!) 160/80 (BP Location: Left Arm, Cuff Size: Normal)    Pulse 95    Temp 98.4 F (36.9 C) (Oral)    Ht 5\' 9"  (1.753 m)    Wt 203 lb 12.8 oz (92.4 kg)    SpO2 91%    BMI 30.10 kg/m    Physical Exam:  General- No distress,  A&Ox3, pleasant, anxious ENT: No sinus tenderness, TM clear, pale nasal mucosa, no oral exudate,+ post nasal drip, no LAN Cardiac: S1, S2, regular rate and rhythm, no murmur Chest: No wheeze/ No rales/ dullness; no accessory muscle use, no nasal flaring, no sternal retractions Abd.: Soft Non-tender, ND, BS +, obese Ext: No clubbing cyanosis,trace BLE  Edema, no obvious deformities Neuro:  Deconditioned at baseline, MAE x 4, A&O x 3, walks with rolling walker Skin: No rashes, warm and dry Psych: normal mood and behavior   Assessment/Plan  Shortness of breath Continued dyspnea despite recent treatment with Augmentin and prednisone Afebrile/ No wheezing on exam CXR with no acute cardiopulmonary process, but RLL with dense rhonchi and crackles on exam Concern there may be an element of Pulm HTN as he has OSA with AHI of 51, but has been unable to tolerate CPAP.  Plan: We will culture your sputum.( AFB, culture and fungal) We  will do some labs( a d dimer and BNP, CBC). We will place an order for a nebulizer machine. We will place an order for nebulized albuterol. Use albuterol nebs twice daily while sick Continue Breo 1 puff once daily Rinse mouth after use. We will walk you today to make sure you are not dropping your oxygen levels.>> you did not drop below 93%. We will consider changing you maintenance medications to nebulized once you are better. Continue Mucinex cough medication as you have been doing. We will send in a prescription for Doxycycline 100 mg twice  daily. Take with full glass of water. Please monitor your BP as it is a bit high today. We will order an Overnight oximetry as you may benefit from night time oxygen. We will test you for COVID 19 , just to make sure this is not the cause of your shortness of breath.. Follow up in 1 week with Judson Roch NP or Wyn Quaker NP Please contact office for sooner follow up if symptoms do not improve or worsen or seek emergency care     Severe obstructive sleep apnea Unable to tolerate CPAP AHI was > 50 Concern untreated OSA may be contributing to dyspnea/ ? [place] Plan O&O as he may benefit from nocturnal oxygen Consider Echo Consider Cath  Pneumonia of both lower lobes due to infectious organism [facility]) Some improvement per CXR after Augmentin and prednisone taper CXR read as improved, but clinically had dense rhonchi and rales per RLL on exam Denies any choking on food, but will need to consider swallow eval if her continues to have  Plan Continue Mucinex cough medication as you have been doing. We will send in a prescription for Doxycycline 100 mg twice daily. Take with full glass of water. Follow up in 1 week to assess response to treatment Please contact office for sooner follow up if symptoms do not improve or worsen or seek emergency care    Asthma, moderate persistent Compliant with Breo daily May benefit from nebulized maintenance medications Plan Plan: Continue Breo Ellipta one puff once daily Rinse mouth after use Continue rescue inhaler as needed for breakthrough shortness of breath Continue Xolair injections Continue Zyrtec daily to help control your allergies/ triggers Please contact office for sooner follow up if symptoms do not improve or worsen or seek emergency care     Magdalen Spatz, NP 12/29/2018  4:51 PM

## 2018-12-29 NOTE — Assessment & Plan Note (Signed)
Continued dyspnea despite recent treatment with Augmentin and prednisone Afebrile/ No wheezing on exam CXR with no acute cardiopulmonary process, but RLL with dense rhonchi and crackles on exam Concern there may be an element of Pulm HTN as he has OSA with AHI of 51, but has been unable to tolerate CPAP.  Plan: We will culture your sputum.( AFB, culture and fungal) We will do some labs( a d dimer and BNP, CBC). We will place an order for a nebulizer machine. We will place an order for nebulized albuterol. Use albuterol nebs twice daily while sick Continue Breo 1 puff once daily Rinse mouth after use. We will walk you today to make sure you are not dropping your oxygen levels.>> you did not drop below 93%. We will consider changing you maintenance medications to nebulized once you are better. Continue Mucinex cough medication as you have been doing. We will send in a prescription for Doxycycline 100 mg twice daily. Take with full glass of water. Please monitor your BP as it is a bit high today. We will order an Overnight oximetry as you may benefit from night time oxygen. We will test you for COVID 19 , just to make sure this is not the cause of your shortness of breath.. Follow up in 1 week with Judson Roch NP or Wyn Quaker NP Please contact office for sooner follow up if symptoms do not improve or worsen or seek emergency care

## 2018-12-30 DIAGNOSIS — D509 Iron deficiency anemia, unspecified: Secondary | ICD-10-CM | POA: Diagnosis not present

## 2018-12-30 DIAGNOSIS — N186 End stage renal disease: Secondary | ICD-10-CM | POA: Diagnosis not present

## 2018-12-30 DIAGNOSIS — D631 Anemia in chronic kidney disease: Secondary | ICD-10-CM | POA: Diagnosis not present

## 2018-12-30 DIAGNOSIS — Z23 Encounter for immunization: Secondary | ICD-10-CM | POA: Diagnosis not present

## 2018-12-30 DIAGNOSIS — E1129 Type 2 diabetes mellitus with other diabetic kidney complication: Secondary | ICD-10-CM | POA: Diagnosis not present

## 2018-12-30 DIAGNOSIS — N2581 Secondary hyperparathyroidism of renal origin: Secondary | ICD-10-CM | POA: Diagnosis not present

## 2018-12-30 NOTE — Progress Notes (Signed)
Reviewed, agree 

## 2018-12-31 ENCOUNTER — Ambulatory Visit: Payer: Self-pay

## 2018-12-31 ENCOUNTER — Other Ambulatory Visit (HOSPITAL_COMMUNITY): Payer: Self-pay | Admitting: Cardiovascular Disease

## 2018-12-31 ENCOUNTER — Telehealth: Payer: Self-pay | Admitting: Internal Medicine

## 2018-12-31 DIAGNOSIS — I6523 Occlusion and stenosis of bilateral carotid arteries: Secondary | ICD-10-CM

## 2018-12-31 LAB — NOVEL CORONAVIRUS, NAA: SARS-CoV-2, NAA: NOT DETECTED

## 2018-12-31 NOTE — Telephone Encounter (Signed)
Called and spoke with Patient.  Patient stated he saw SG Tuesday and was prescribed antibiotic and neb.  Patient stated he has received antibiotic and started it, but he has not received neb machine.  Patient stated he was waiting on DME. Patient stated he was expecting machine anytime.  Instructed patient to call is he hasn't received neb machine by Friday. DME [facility]. Nothing further at this time.

## 2018-12-31 NOTE — Telephone Encounter (Signed)
TC to inform patient of negative Covid19 results. No answer. Routing to PCP and Dr. Valere Dross

## 2018-12-31 NOTE — Telephone Encounter (Signed)
Patient returned call. Informed of negative Covid 19 test results. No further questions asked.

## 2019-01-01 ENCOUNTER — Telehealth: Payer: Self-pay

## 2019-01-01 DIAGNOSIS — D509 Iron deficiency anemia, unspecified: Secondary | ICD-10-CM | POA: Diagnosis not present

## 2019-01-01 DIAGNOSIS — E1129 Type 2 diabetes mellitus with other diabetic kidney complication: Secondary | ICD-10-CM | POA: Diagnosis not present

## 2019-01-01 DIAGNOSIS — N186 End stage renal disease: Secondary | ICD-10-CM | POA: Diagnosis not present

## 2019-01-01 DIAGNOSIS — N2581 Secondary hyperparathyroidism of renal origin: Secondary | ICD-10-CM | POA: Diagnosis not present

## 2019-01-01 DIAGNOSIS — D631 Anemia in chronic kidney disease: Secondary | ICD-10-CM | POA: Diagnosis not present

## 2019-01-01 DIAGNOSIS — Z23 Encounter for immunization: Secondary | ICD-10-CM | POA: Diagnosis not present

## 2019-01-01 NOTE — Telephone Encounter (Signed)
Spoke with patient. He was calling to check on the status of the nebulizer machine order that was placed on 5/26. Advised him that normally these orders take a few days, especially after a holiday but I would call [facility] to check on this for him. He verbalized understanding.   Spoke with Jeneen Rinks at [facility]. He stated that the patient's machine was out for delivery yesterday and should have been delivered yesterday. He will reach out to the delivery driver to see why this wasn't done and will call the patient to setup delivery today.   Spoke with patient. Explained to him that Jeneen Rinks at [facility] was looking into why this was not done yesterday. Assured patient that he will receive his machine today. He verbalized understanding.   Nothing further needed at time of call.

## 2019-01-04 DIAGNOSIS — D509 Iron deficiency anemia, unspecified: Secondary | ICD-10-CM | POA: Diagnosis not present

## 2019-01-04 DIAGNOSIS — Z992 Dependence on renal dialysis: Secondary | ICD-10-CM | POA: Diagnosis not present

## 2019-01-04 DIAGNOSIS — D631 Anemia in chronic kidney disease: Secondary | ICD-10-CM | POA: Diagnosis not present

## 2019-01-04 DIAGNOSIS — I129 Hypertensive chronic kidney disease with stage 1 through stage 4 chronic kidney disease, or unspecified chronic kidney disease: Secondary | ICD-10-CM | POA: Diagnosis not present

## 2019-01-04 DIAGNOSIS — I6523 Occlusion and stenosis of bilateral carotid arteries: Secondary | ICD-10-CM | POA: Diagnosis not present

## 2019-01-04 DIAGNOSIS — R0989 Other specified symptoms and signs involving the circulatory and respiratory systems: Secondary | ICD-10-CM | POA: Diagnosis not present

## 2019-01-04 DIAGNOSIS — N2581 Secondary hyperparathyroidism of renal origin: Secondary | ICD-10-CM | POA: Diagnosis not present

## 2019-01-04 DIAGNOSIS — E1129 Type 2 diabetes mellitus with other diabetic kidney complication: Secondary | ICD-10-CM | POA: Diagnosis not present

## 2019-01-04 DIAGNOSIS — N186 End stage renal disease: Secondary | ICD-10-CM | POA: Diagnosis not present

## 2019-01-05 ENCOUNTER — Other Ambulatory Visit: Payer: Self-pay

## 2019-01-05 ENCOUNTER — Ambulatory Visit (HOSPITAL_COMMUNITY)
Admission: RE | Admit: 2019-01-05 | Discharge: 2019-01-05 | Disposition: A | Discharge status: Home / Self Care | Payer: Medicare Other | Source: Ambulatory Visit | Attending: Cardiology | Admitting: Cardiology

## 2019-01-05 ENCOUNTER — Other Ambulatory Visit (HOSPITAL_COMMUNITY): Payer: Self-pay | Admitting: Cardiovascular Disease

## 2019-01-05 ENCOUNTER — Ambulatory Visit (HOSPITAL_BASED_OUTPATIENT_CLINIC_OR_DEPARTMENT_OTHER)
Admission: RE | Admit: 2019-01-05 | Discharge: 2019-01-05 | Disposition: A | Discharge status: Home / Self Care | Payer: Medicare Other | Source: Ambulatory Visit | Attending: Cardiology | Admitting: Cardiology

## 2019-01-05 ENCOUNTER — Telehealth: Payer: Self-pay | Admitting: Acute Care

## 2019-01-05 DIAGNOSIS — I6523 Occlusion and stenosis of bilateral carotid arteries: Secondary | ICD-10-CM

## 2019-01-05 DIAGNOSIS — R0989 Other specified symptoms and signs involving the circulatory and respiratory systems: Secondary | ICD-10-CM | POA: Diagnosis not present

## 2019-01-05 NOTE — Telephone Encounter (Signed)
Shane [name] is there anything triage needs to do with this encounter?

## 2019-01-05 NOTE — Telephone Encounter (Signed)
No. Thanks Ria Comment

## 2019-01-05 NOTE — Telephone Encounter (Signed)
Opened in error

## 2019-01-06 DIAGNOSIS — N2581 Secondary hyperparathyroidism of renal origin: Secondary | ICD-10-CM | POA: Diagnosis not present

## 2019-01-06 DIAGNOSIS — D509 Iron deficiency anemia, unspecified: Secondary | ICD-10-CM | POA: Diagnosis not present

## 2019-01-06 DIAGNOSIS — N186 End stage renal disease: Secondary | ICD-10-CM | POA: Diagnosis not present

## 2019-01-06 DIAGNOSIS — E1129 Type 2 diabetes mellitus with other diabetic kidney complication: Secondary | ICD-10-CM | POA: Diagnosis not present

## 2019-01-06 DIAGNOSIS — D631 Anemia in chronic kidney disease: Secondary | ICD-10-CM | POA: Diagnosis not present

## 2019-01-07 ENCOUNTER — Ambulatory Visit (INDEPENDENT_AMBULATORY_CARE_PROVIDER_SITE_OTHER): Payer: Medicare Other | Admitting: Acute Care

## 2019-01-07 ENCOUNTER — Telehealth: Payer: Self-pay | Admitting: Cardiovascular Disease

## 2019-01-07 ENCOUNTER — Encounter: Payer: Self-pay | Admitting: Acute Care

## 2019-01-07 VITALS — BP 158/78 | HR 92 | Temp 97.3°F | Ht 70.0 in | Wt 203.4 lb

## 2019-01-07 DIAGNOSIS — I739 Peripheral vascular disease, unspecified: Secondary | ICD-10-CM

## 2019-01-07 DIAGNOSIS — I255 Ischemic cardiomyopathy: Secondary | ICD-10-CM

## 2019-01-07 DIAGNOSIS — R7989 Other specified abnormal findings of blood chemistry: Secondary | ICD-10-CM | POA: Diagnosis not present

## 2019-01-07 DIAGNOSIS — J181 Lobar pneumonia, unspecified organism: Secondary | ICD-10-CM

## 2019-01-07 DIAGNOSIS — J986 Disorders of diaphragm: Secondary | ICD-10-CM | POA: Diagnosis not present

## 2019-01-07 DIAGNOSIS — J189 Pneumonia, unspecified organism: Secondary | ICD-10-CM

## 2019-01-07 NOTE — Progress Notes (Signed)
History of Present Illness Shane [name] is a 85 y.o. male former smoker ( quit 1960 with a 5 pack year history) followed by our office for asthma. He is a patient of Dr. Lake Bells  PMH: significant formoderate persistentasthma, DM type 2 with renal complixations, MWF HD Smoker/ Smoking History: Former Smoker. 1960. 5 pack year history.   Maintenance:  Breo 200, Xolair q 3 weeks  01/07/2019  Pt. Presents for follow up. He states he is still having shortness of breath, but it is much better than it was last week. He is using his nebs twice daily. He states he does feel better after he uses his nebs. He is compliant with his Memory Dance and his Xolair.He is using his Nasocort as needed.He is compliant with Zyrtec. He is compliant with HD three days a week. Dry weight is 91.1 Kg. He denies any issues with choking on either solid food or liquids. He states his energy level is better. He states he does have continued white secretions at time with cough.He denies any recent long car trips or change in his mobility. Denies any leg or knee pain. He denies fever, chest pain, orthopnea or hemoptysis.  Test Results: 12/29/2018 CXR Unchanged left chest wall pacemaker. Stable cardiomediastinal silhouette. Normal pulmonary vascularity. Chronic changes at the lung bases with elevation of the right hemidiaphragm. No focal consolidation, pleural effusion, or pneumothorax. No acute osseous abnormality.  IMPRESSION: No active cardiopulmonary disease.   12/03/2018-chest x-ray- reticular nodular opacities in right greater than left lung, may represent multifocal pneumonia  01/01/2018-echocardiogram-LV ejection fraction 40 to 51%, grade 1 diastolic dysfunction, mild mitral regurgitation  12/07/2015- split-night sleep study- AHI 51.5  07/07/2013 - Allergy profile - 0.28 ragweed, ige 209  PFT: PFT Results Latest Ref Rng & Units 01/22/2016 07/07/2013  FVC-Pre L 2.97 -  FVC-Predicted Pre % 72 91  FVC-Post L  3.06 3.75  FVC-Predicted Post % 74 89  Pre FEV1/FVC % % 75 74  Post FEV1/FCV % % 77 79  FEV1-Pre L 2.23 2.85  FEV1-Predicted Pre % 76 94  FEV1-Post L 2.37 2.96  DLCO UNC% % 58 64  DLCO COR %Predicted % 82 78  TLC L 5.56 5.94  TLC % Predicted % 77 83  RV % Predicted % 91 81    Imaging:  ImagingResults  Dg Chest 2 View  Result Date: 12/03/2018 CLINICAL DATA:  85 year old male with shortness of breath EXAM: CHEST - 2 VIEW COMPARISON:  12/16/2017 FINDINGS: Cardiomediastinal silhouette unchanged in size and contour. Unchanged position of left chest wall cardiac pacing device/AICD. Surgical changes of median sternotomy. Increased reticulonodular opacity of the right greater than left lungs, most pronounced at the right lung base. No pleural effusion or pneumothorax. No displaced fracture. IMPRESSION: Reticulonodular opacities of the right greater than left lung, may represent multifocal pneumonia. Unchanged cardiac AICD and changes of prior sternotomy Electronically Signed   By: Corrie Mckusick D.O.   On: 12/03/2018 15:55           Specialty Problems            Pulmonary Problems    Allergic rhinitis     07/07/2013 - Allergy profile - 0.28 ragweed, ige 209        Asthma, moderate persistent     Pulmonary functions on 07/07/2013: FEV1 98% predicted FVC 89% predicted FEV1 to FVC ratio 79% predicted total lung capacity 83% predicted diffusion capacity 64% predicted airway resistance 163% predicted IgE level greater than 200 07/15/13  CT Sinus : sinusitis;   Ct Chest : normal       Sinusitis, chronic    Severe obstructive sleep apnea     12/07/2015- split-night sleep study- AHI 51.5       Restrictive lung disease    Bronchitis    Shortness of breath    Pneumonia of both lower lobes due to infectious organism ([place])     12/03/2018-chest x-ray- reticular nodular opacities in right greater than left lung, may represent multifocal pneumonia         CBC  Latest Ref Rng & Units 12/29/2018 12/19/2017 12/18/2017  WBC 4.0 - 10.5 K/uL 4.0 8.2 7.1  Hemoglobin 13.0 - 17.0 g/dL 10.3(L) 8.8(L) 9.7(L)  Hematocrit 39.0 - 52.0 % 30.8(L) 28.5(L) 31.7(L)  Platelets 150.0 - 400.0 K/uL 81.0(L) 123(L) 144(L)    BMP Latest Ref Rng & Units 12/19/2017 12/18/2017 12/17/2017  Glucose 65 - 99 mg/dL 163(H) 139(H) 190(H)  BUN 6 - 20 mg/dL 77(H) 45(H) 46(H)  Creatinine 0.61 - 1.24 mg/dL 4.34(H) 3.32(H) 3.45(H)  BUN/Creat Ratio 10 - 24 - - -  Sodium 135 - 145 mmol/L 135 138 134(L)  Potassium 3.5 - 5.1 mmol/L 3.9 4.3 4.0  Chloride 101 - 111 mmol/L 97(L) 100(L) 97(L)  CO2 22 - 32 mmol/L 25 23 24   Calcium 8.9 - 10.3 mg/dL 8.5(L) 8.5(L) 8.4(L)    BNP    Component Value Date/Time   BNP 681.1 (H) 12/16/2017 0133    ProBNP    Component Value Date/Time   PROBNP 2,417.0 (H) 12/29/2018 1428    PFT    Component Value Date/Time   FEV1PRE 2.23 01/22/2016 1251   FEV1POST 2.37 01/22/2016 1251   FVCPRE 2.97 01/22/2016 1251   FVCPOST 3.06 01/22/2016 1251   TLC 5.56 01/22/2016 1251   DLCOUNC 19.27 01/22/2016 1251   PREFEV1FVCRT 75 01/22/2016 1251   PSTFEV1FVCRT 77 01/22/2016 1251    Dg Chest 2 View  Result Date: 12/29/2018 CLINICAL DATA:  Shortness of breath. EXAM: CHEST - 2 VIEW COMPARISON:  Chest x-ray dated December 03, 2018. FINDINGS: Unchanged left chest wall pacemaker. Stable cardiomediastinal silhouette. Normal pulmonary vascularity. Chronic changes at the lung bases with elevation of the right hemidiaphragm. No focal consolidation, pleural effusion, or pneumothorax. No acute osseous abnormality. IMPRESSION: No active cardiopulmonary disease. Electronically Signed   By: Titus Dubin M.D.   On: 12/29/2018 12:40   Vas [place] Le Art Seg Multi (segm&le Reynauds)  Result Date: 01/06/2019 LOWER EXTREMITY DOPPLER STUDY Indications: Claudication, and Patient states that his left leg is very weak and              in constant pain. This has been ongoing for years but has  symptoms              have been worse the past few weeks. High Risk Factors: Hypertension, hyperlipidemia, Diabetes, past history of                    smoking, coronary artery disease.  Performing Technologist: Wilkie Aye RVT  Examination Guidelines: A complete evaluation includes at minimum, Doppler waveform signals and systolic blood pressure reading at the level of bilateral brachial, anterior tibial, and posterior tibial arteries, when vessel segments are accessible. Bilateral testing is considered an integral part of a complete examination. Photoelectric Plethysmograph (PPG) waveforms and toe systolic pressure readings are included as required and additional duplex testing as needed. Limited examinations for reoccurring indications may be performed as noted.  ABI  Findings: +---------+------------------+-----+---------+----------------+  Right     Rt Pressure (mmHg) Index Waveform  Comment           +---------+------------------+-----+---------+----------------+  Brachial                                     AVF for dialysis  +---------+------------------+-----+---------+----------------+  CFA                                triphasic                   +---------+------------------+-----+---------+----------------+  Popliteal                          triphasic                   +---------+------------------+-----+---------+----------------+  ATA       177                1.50  triphasic                   +---------+------------------+-----+---------+----------------+  PTA       170                1.44  triphasic                   +---------+------------------+-----+---------+----------------+  PERO      190                1.61  triphasic                   +---------+------------------+-----+---------+----------------+ +---------+------------------+-----+-------------------+-------+  Left      Lt Pressure (mmHg) Index Waveform            Comment  +---------+------------------+-----+-------------------+-------+   Brachial  118                                                   +---------+------------------+-----+-------------------+-------+  CFA                                dampened monophasic          +---------+------------------+-----+-------------------+-------+  Popliteal                          dampened monophasic          +---------+------------------+-----+-------------------+-------+  ATA       66                 0.56  dampened monophasic          +---------+------------------+-----+-------------------+-------+  PTA       64                 0.54  dampened monophasic          +---------+------------------+-----+-------------------+-------+  PERO      76                 0.64  dampened monophasic          +---------+------------------+-----+-------------------+-------+ +-------+-----------+-----------+------------+------------+  ABI/TBI Today's ABI Today's TBI Previous ABI Previous TBI  +-------+-----------+-----------+------------+------------+  Right   [place]                                                 +-------+-----------+-----------+------------+------------+  Left    0.64                                               +-------+-----------+-----------+------------+------------+  Unable to obtain toe PPG and pressures due to patients involuntary movement.  Summary: Right: Resting right ankle-brachial index indicates noncompressible right lower extremity arteries. Left: Resting left ankle-brachial index indicates moderate left lower extremity arterial disease.  *See table(s) above for measurements and observations.  Vascular consult recommended. Electronically signed by [name] MD on 01/06/2019 at 4:47:03 PM.    Final    Vas US Carotid  Result Date: 01/06/2019 Carotid Arterial Duplex Study Indications:       Carotid artery disease and Patient denies any cerebrovascular                    symptoms. Risk Factors:      Hypertension, hyperlipidemia, Diabetes, past history of                    smoking, coronary  artery disease. Comparison Study:  In 01/2017, a carotid duplex showed a RICA velocity of 148/19                    cm/s and a LICA velocity of 54/65 cm/s. Performing Technologist: Wilkie Aye RVT  Examination Guidelines: A complete evaluation includes B-mode imaging, spectral Doppler, color Doppler, and power Doppler as needed of all accessible portions of each vessel. Bilateral testing is considered an integral part of a complete examination. Limited examinations for reoccurring indications may be performed as noted.  Right Carotid Findings: +----------+--------+--------+--------+--------------------------+--------+             PSV cm/s EDV cm/s Stenosis Describe                   Comments  +----------+--------+--------+--------+--------------------------+--------+  CCA Prox   54       9                 heterogenous and irregular           +----------+--------+--------+--------+--------------------------+--------+  CCA Mid    66       9                 calcific and irregular               +----------+--------+--------+--------+--------------------------+--------+  CCA Distal 78       15                irregular and heterogenous           +----------+--------+--------+--------+--------------------------+--------+  ICA Prox   95       22                calcific and heterogenous            +----------+--------+--------+--------+--------------------------+--------+  ICA Mid    236      48       40-59%   calcific                             +----------+--------+--------+--------+--------------------------+--------+  ICA Distal 76       15                                                     +----------+--------+--------+--------+--------------------------+--------+  ECA        83       7                                                      +----------+--------+--------+--------+--------------------------+--------+ +----------+--------+-------+----------------+-------------------+             PSV cm/s EDV cms Describe          Arm Pressure (mmHG)  +----------+--------+-------+----------------+-------------------+  Subclavian 296              AVF for dialysis                      +----------+--------+-------+----------------+-------------------+ +---------+--------+--+--------+-+---------+  Vertebral PSV cm/s 30 EDV cm/s 7 Antegrade  +---------+--------+--+--------+-+---------+ Right brachial blood pressure not obtained due to fistula.  Left Carotid Findings: +----------+--------+--------+--------+--------------------------+--------+             PSV cm/s EDV cm/s Stenosis Describe                   Comments  +----------+--------+--------+--------+--------------------------+--------+  CCA Prox   59       10                                                     +----------+--------+--------+--------+--------------------------+--------+  CCA Mid    112      13                                                     +----------+--------+--------+--------+--------------------------+--------+  CCA Distal 108      13                irregular and heterogenous           +----------+--------+--------+--------+--------------------------+--------+  ICA Prox   71       15       1-39%    calcific and heterogenous            +----------+--------+--------+--------+--------------------------+--------+  ICA Mid    88       15                heterogenous                         +----------+--------+--------+--------+--------------------------+--------+  ICA Distal 85       13                                                     +----------+--------+--------+--------+--------------------------+--------+  ECA        141      47                                                     +----------+--------+--------+--------+--------------------------+--------+ +----------+--------+--------+----------------+-------------------+  Subclavian PSV cm/s EDV cm/s Describe         Arm Pressure (mmHG)  +----------+--------+--------+----------------+-------------------+              211               Multiphasic, WNL 118                  +----------+--------+--------+----------------+-------------------+ +---------+--------+--+--------+-+---------+  Vertebral PSV cm/s 26 EDV cm/s 6 Antegrade  +---------+--------+--+--------+-+---------+  Summary: Right Carotid: Velocities in the right ICA are consistent with a 40-59%                stenosis. Non-hemodynamically significant plaque <50% noted in                the CCA. Left Carotid: Velocities in the left ICA are consistent with a 1-39% stenosis.               Non-hemodynamically significant plaque noted in the CCA. Vertebrals:  Bilateral vertebral arteries demonstrate antegrade flow. Subclavians: Right subclavian artery flow was disturbed. Normal flow              hemodynamics were seen in the left subclavian artery. *See table(s) above for measurements and observations. Suggest follow up study in 12 months. Electronically signed by [name] MD on 01/06/2019 at 4:47:23 PM.    Final    Vas [place] Lower Extremity Arterial Duplex  Result Date: 01/06/2019 LOWER EXTREMITY ARTERIAL DUPLEX STUDY Indications: Claudication, peripheral artery disease, and Patient states that              his left leg is very weak and in constant pain. This has been              ongoing for years but has symptoms have been worse the past few              weeks. High Risk Factors: Hypertension, hyperlipidemia, Diabetes, past history of                    smoking, coronary artery disease.  Current ABI: Today the right ABI was non compressible and the left was 0.64 Performing Technologist: Wilkie Aye RVT  Examination Guidelines: A complete evaluation includes B-mode imaging, spectral Doppler, color Doppler, and power Doppler as needed of all accessible portions of each vessel. Bilateral testing is considered an integral part of a complete examination. Limited examinations for reoccurring indications may be performed as noted.   +-----------+--------+-----+--------+---------+-------------+  RIGHT       PSV cm/s Ratio Stenosis Waveform  Comments       +-----------+--------+-----+--------+---------+-------------+  CIA Prox    178                     biphasic                 +-----------+--------+-----+--------+---------+-------------+  CIA Mid     234                     biphasic  >50% stenosis  +-----------+--------+-----+--------+---------+-------------+  CIA  Distal  97                      biphasic                 +-----------+--------+-----+--------+---------+-------------+  EIA Prox    108                     biphasic                 +-----------+--------+-----+--------+---------+-------------+  CFA Prox    93                      triphasic                +-----------+--------+-----+--------+---------+-------------+  CFA Distal  70                      triphasic                +-----------+--------+-----+--------+---------+-------------+  DFA         57                      triphasic                +-----------+--------+-----+--------+---------+-------------+  SFA Prox    68                      triphasic                +-----------+--------+-----+--------+---------+-------------+  SFA Mid     107                     triphasic                +-----------+--------+-----+--------+---------+-------------+  SFA Distal  105                     triphasic                +-----------+--------+-----+--------+---------+-------------+  POP Prox    84                      triphasic                +-----------+--------+-----+--------+---------+-------------+  POP Distal  83                      triphasic                +-----------+--------+-----+--------+---------+-------------+  ATA Mid     59                      triphasic                +-----------+--------+-----+--------+---------+-------------+  PTA Distal  49                      triphasic                +-----------+--------+-----+--------+---------+-------------+  PERO Distal 83                       triphasic                +-----------+--------+-----+--------+---------+-------------+  +-----------+--------+-----+--------+-------------------+--------+  LEFT        PSV cm/s Ratio Stenosis Waveform  Comments  +-----------+--------+-----+--------+-------------------+--------+  CIA Prox                   occluded                               +-----------+--------+-----+--------+-------------------+--------+  CIA Mid                    occluded                               +-----------+--------+-----+--------+-------------------+--------+  CIA Distal                 occluded                               +-----------+--------+-----+--------+-------------------+--------+  EIA Prox    23                      monophasic                    +-----------+--------+-----+--------+-------------------+--------+  EIA Distal  20                      monophasic                    +-----------+--------+-----+--------+-------------------+--------+  CFA Prox    30                      monophasic                    +-----------+--------+-----+--------+-------------------+--------+  CFA Distal  21                      monophasic                    +-----------+--------+-----+--------+-------------------+--------+  DFA         24                      monophasic                    +-----------+--------+-----+--------+-------------------+--------+  SFA Prox    23                      monophasic                    +-----------+--------+-----+--------+-------------------+--------+  SFA Mid     22                      monophasic                    +-----------+--------+-----+--------+-------------------+--------+  SFA Distal  29                      monophasic                    +-----------+--------+-----+--------+-------------------+--------+  POP Prox    31                      monophasic                    +-----------+--------+-----+--------+-------------------+--------+  POP Distal  12                       monophasic                    +-----------+--------+-----+--------+-------------------+--------+  ATA Prox    8                       dampened monophasic           +-----------+--------+-----+--------+-------------------+--------+  PTA Distal  13                      monophasic                    +-----------+--------+-----+--------+-------------------+--------+  PERO Distal 9                       monophasic                    +-----------+--------+-----+--------+-------------------+--------+  Aorta: +--------+-------+----------+----------+--------+--------+-----+           AP (cm) Trans (cm) PSV (cm/s) Waveform Thrombus Shape  +--------+-------+----------+----------+--------+--------+-----+  Proximal         2.30                                           +--------+-------+----------+----------+--------+--------+-----+  Mid      2.10    2.20       48                                  +--------+-------+----------+----------+--------+--------+-----+  Distal   2.50    2.80       54                                  +--------+-------+----------+----------+--------+--------+-----+  Technically challenging study due to abdominal girth, bowel gas and flash artifact.  Summary: Right: Atherosclerosis in the aorta, iliac, femoral and tibial arteries. >50% stenosis in the common iliac artery. No signficant stenosis in the femoral, popliteal and tibial arteries Three vessel run off. Left: Atherosclerosis in the aorta, iliac, femoral and tibial arteries. High grade stenosis versus occlusion of the common iliac artery. No significant stenosis in the femoral and popliteal arteries. Three vessel run off.  See table(s) above for measurements and observations. Vascular consult recommended. Electronically signed by [name] MD on 01/06/2019 at 4:46:11 PM.    Final      Past medical hx Past Medical History:  Diagnosis Date   Abnormal nuclear cardiac imaging test Nov 2010   moderate area of infarct in the inferior wall with  only minimal reversibility and EF of 28%   AICD (automatic cardioverter/defibrillator) present    Allergic rhinitis 01-08-13   Uses nebulizer for chronic sinus issues and Mucinex.   Arthritis    osteoarthritis    Asthma    Extrinic   Blood transfusion    ? at time of bypass surgery    BPH (benign prostatic hyperplasia)    Cellulitis of arm, left    MSSA   CHF (congestive heart failure) (HCC)    CKD (chronic kidney disease), stage III ([place])    "was  told due to meds he takes"-no Renal workup done   Colon polyps    adenomatous   Coronary artery disease    remote CABG in 1985, cath in 2003 by Dr. Lia Foyer with no PCI, last nuclear in 2010 showing scar and EF of 28%.    Diabetes mellitus    Diabetic neuropathy ([place]) 04/25/2017   Dyslipidemia    Dyspnea    Gait abnormality 04/25/2017   Glaucoma 01-08-13   tx. eye drops   Hemodialysis patient ([place])    [place] (hard of hearing)    HTN (hypertension)    Hypercholesterolemia    Left ventricular dysfunction    28% per nuclear in 2010 and 35 to 40% per echo in 2010   Myocardial infarction [facility])    1985   Neuromuscular disorder ([place])    legs mild paralysis-able to walk"nerve damage"-legs- left leg brace    Neuropathy    Pneumonia    hx of several times years ago    Spinal stenosis      Social History   Tobacco Use   Smoking status: Former Smoker    Packs/day: 1.00    Years: 10.00    Pack years: 10.00    Types: Cigarettes, Pipe, Cigars    Start date: 12/02/1949    Last attempt to quit: 08/05/1958    Years since quitting: 60.4   Smokeless tobacco: Never Used  Substance Use Topics   Alcohol use: No   Drug use: No    Mr.Pittman reports that he quit smoking about 60 years ago. His smoking use included cigarettes, pipe, and cigars. He started smoking about 69 years ago. He has a 10.00 pack-year smoking history. He has never used smokeless tobacco. He reports that he does not drink alcohol or use  drugs.  Tobacco Cessation: Former smoker with a  10 pack year smoking history. Quit 1960  Past surgical hx, Family hx, Social hx all reviewed.  Current Outpatient Medications on File Prior to Visit  Medication Sig   albuterol (PROVENTIL) (2.5 MG/3ML) 0.083% nebulizer solution Take 3 mLs (2.5 mg total) by nebulization every 6 (six) hours as needed for wheezing or shortness of breath.   Albuterol Sulfate (PROAIR RESPICLICK) 397 (90 Base) MCG/ACT AEPB Inhale 1-2 puffs into the lungs every 6 (six) hours as needed.   allopurinol (ZYLOPRIM) 100 MG tablet Take 1 tablet (100 mg total) by mouth daily.   amiodarone (PACERONE) 200 MG tablet Take 1 tablet (200 mg total) by mouth daily.   ammonium lactate (AMLACTIN) 12 % lotion Apply 1 application topically 2 (two) times daily.   aspirin EC 81 MG tablet Take 81 mg by mouth daily.    atorvastatin (LIPITOR) 10 MG tablet TAKE 1 TABLET DAILY   B Complex-C-Folic Acid (RENAL-VITE) 0.8 MG TABS Rena-Vite 0.8 mg tablet   calcitRIOL (ROCALTROL) 0.25 MCG capsule Take 1 capsule (0.25 mcg total) by mouth every Monday, Wednesday, and Friday with hemodialysis.   calcium acetate (PHOSLO) 667 MG capsule TAKE 2 CAPSULES BY MOUTH THREE TIMES DAILY WITH MEALS AND 1 TWICE DAILY WITH SNACKS   cetirizine (ZYRTEC) 10 MG tablet Take 10 mg by mouth at bedtime as needed (seasonal allergies).    clotrimazole-betamethasone (LOTRISONE) lotion Apply to left foot twice daily   dextromethorphan-guaiFENesin (MUCINEX DM) 30-600 MG 12hr tablet Take 1 tablet by mouth 2 (two) times daily as needed for cough.   dicyclomine (BENTYL) 10 MG capsule TAKE 1 CAPSULE BY MOUTH THREE TIMES DAILY AS NEEDED USE SPARINGLY  diphenhydrAMINE (BENADRYL) 12.5 MG/5ML elixir Take by mouth.   fluocinonide cream (LIDEX) 0.93 % Apply 1 application topically 2 (two) times daily as needed (for itchy rash).    fluticasone furoate-vilanterol (BREO ELLIPTA) 200-25 MCG/INH AEPB Inhale 1 puff into the  lungs daily.   glipiZIDE (GLUCOTROL XL) 2.5 MG 24 hr tablet Take 5 mg by mouth daily.    lidocaine-prilocaine (EMLA) cream Apply 1 application topically daily as needed for painful procedure/intervention.   lidocaine-prilocaine (EMLA) cream lidocaine-prilocaine 2.5 %-2.5 % topical cream   multivitamin (RENA-VIT) TABS tablet Take 1 tablet by mouth at bedtime.   OMEPRAZOLE PO Take 20 mg by mouth daily.   polyethylene glycol (MIRALAX / GLYCOLAX) packet Take 17 g by mouth daily as needed for mild constipation.   Polyethylene Glycol 3350 GRAN polyethylene glycol 3350 17 gram oral powder packet   Syringe/Needle, Disp, (SYRINGE 3CC/22GX1") 22G X 1" 3 ML MISC Use to give injection   tadalafil (CIALIS) 20 MG tablet Take 20 mg by mouth daily as needed for erectile dysfunction. Do not exceed 2 doses in 7 days   testosterone cypionate (DEPOTESTOTERONE CYPIONATE) 100 MG/ML injection Inject 400 mg into the muscle every 21 ( twenty-one) days. For IM use only   Testosterone Cypionate 100 MG/ML SOLN Inject into the muscle.   Testosterone Cypionate 100 MG/ML SOLN Inject into the muscle.   triamcinolone (NASACORT AQ) 55 MCG/ACT AERO nasal inhaler Place 2 sprays into the nose daily as needed (seasonal allergies).    zolpidem (AMBIEN) 10 MG tablet TAKE 1/2 (ONE-HALF) TABLET BY MOUTH AT BEDTIME. TAKE   AN  ADDITIONAL  1/2  TABLET  IF  NEEDED  FOR  SLEEP.   doxycycline (VIBRA-TABS) 100 MG tablet Take 1 tablet (100 mg total) by mouth 2 (two) times daily. (Patient not taking: Reported on 01/07/2019)   Current Facility-Administered Medications on File Prior to Visit  Medication   omalizumab Arvid Right) injection 300 mg   omalizumab Arvid Right) injection 300 mg     Allergies  Allergen Reactions   Codeine Other (See Comments)    anxiety   Hydrocodone Other (See Comments)    Anxiety- can take in liquid form   Pseudoephedrine Other (See Comments)    Causes heart to race   Azithromycin Rash    Review  Of Systems:  Constitutional:   No  weight loss, night sweats,  Fevers, chills, fatigue, or  lassitude.  HEENT:   + headaches,  Difficulty swallowing,  Tooth/dental problems, or  Sore throat,                No sneezing, itching, ear ache, nasal congestion, + post nasal drip,   CV:  No chest pain,  Orthopnea, PND, swelling in lower extremities, anasarca, dizziness, palpitations, syncope.   GI  No heartburn, indigestion, abdominal pain, nausea, vomiting, diarrhea, change in bowel habits, loss of appetite, bloody stools.   Resp: + shortness of breath with exertion less at rest.  + excess mucus, + productive cough,  No non-productive cough,  No coughing up of blood.  No change in color of mucus.  No wheezing.  No chest wall deformity, secretions are white  Skin: no rash or lesions.  GU: no dysuria, change in color of urine, no urgency or frequency.  No flank pain, no hematuria   MS:  No joint pain or swelling.  No decreased range of motion.  No back pain.  Psych:  No change in mood or affect. No depression or anxiety.  No  memory loss.   Vital Signs BP (!) 158/78 (BP Location: Left Arm, Cuff Size: Normal)    Pulse 92    Temp (!) 97.3 F (36.3 C) (Oral)    Ht 5\' 10"  (1.778 m)    Wt 203 lb 6.4 oz (92.3 kg)    SpO2 96%    BMI 29.18 kg/m    Physical Exam:  General- No distress,  A&Ox3, pleasant ENT: No sinus tenderness, TM clear, pale nasal mucosa, no oral exudate,+ post nasal drip, no LAN Cardiac: S1, S2, regular rate and rhythm, no murmur Chest: No wheeze/ rales/ dullness; no accessory muscle use, no nasal flaring, no sternal retractions, + crackles and rhonchi RLL Abd.: Soft Non-tender, NT, ND, BS +, Body mass index is 29.18 kg/m. Ext: No clubbing cyanosis, trace BLE edema Neuro:  Deconditioned at baseline, MAE x 4, A&O x 3 Skin: No rashes, no lesions, warm and dry Psych: normal mood and behavior   Assessment/Plan Multi Focal Pneumonia diagnosed 12/03/2018 Continued Dyspnea CXR  has cleared ( 5/26) Completed Augmentin and short course of prednisone Did have worsening dyspnea 5/26 Endorses clinical improvement over the last week Continued RLL crackles Plan: CTA to evaluate RLL further and r/o PE Aggressive pulmonary toilet Consider flutter / IS/ Mucinex  once CTA chest viewed Follow-up in office in 4 weeks We will recollect sputum speciman Consider swallow eval with recurrance Contact our office sooner if you are having worsened breathing or worsening clinical symptoms  Elevated  Right Hemi diaphragm Reduced lung volumes on the right May be 2/2 recent pneumonia Plan Sniff Test  Elevated d-dimer>> 2.45 mcg/mL FEU Plan CTA to rile out PE We will call you with the results  Severe obstructive sleep apnea Assessment: Severe obstructive sleep apnea on split-night sleep study Patient is intolerant of CPAP due to claustrophobia Concern untreated OSA may be cause of  Plan: Joselyn Arrow ordered at patient's request>> results not yet reported Consider follow up echo if dyspnea becomes progressive   Asthma, moderate persistent Maintained on Breo Ellipta 200 No audible wheezing on exam today Bibasilar crackles right greater than left/ rhonchi Continue Xolair injections every 3 weeks Plan: Continue Breo Ellipta Rinse mouth after use Continue rescue inhaler as needed for breakthrough shortness of breath or wheezing Continue Xolair every 3 weeks Continue Zyrtec daily 1 month follow up with Aaron Edelman or Dr. Lake Bells  ESRD MWF Hemodialysis Dry weight 91.1 Kg Plan Per Nephrology Monitor weight Follow fluid restrictions  This appointment was 30 min long with over 50% of the time in direct face-to-face patient care, assessment, plan of care, and follow-up.   Magdalen Spatz, NP 01/07/2019  7:31 PM

## 2019-01-07 NOTE — Patient Instructions (Addendum)
It is nice to see you today. We will order a sniff test to evaluate your diaphragm. We will order a CTA without contrast  Continue Breo 1 puff once a day  Rinse mouth after use Continue Albuterol nebs as you have been doing We will recollect sputum today Xolair injection next week. Continue Nasocort and Zyrtec as you have been doing. Continue HD as you have been doing Maintain dry weight of 91.1 KG We may need to consider having HD pull additional fluid if dyspnea does not resolve May need to consider swallow eval if recurrent pneumonias in future Follow up in 1 months with Wyn Quaker NP or Dr. [place] Bells Please contact office for sooner follow up if symptoms do not improve or worsen or seek emergency care

## 2019-01-07 NOTE — Telephone Encounter (Signed)
Reviewed results of carotid and lower extremity vascular studies with patient. He verbalized understanding to have repeat carotid ultrasound in 1 year. He agrees to referral to [facility] for evaluation of lower extremity disease and requests to see Dr. Gwenlyn Found. I advised that I will place referral and send message to schedulers to make an appointment for him. He requests appointments on Tuesdays or Thursdays due to dialysis on M,W,F. I advised him to call back next week if he has not heard from Dr. Kennon Holter office. He thanked me for the call.

## 2019-01-07 NOTE — Telephone Encounter (Signed)
-----   Message from Thayer Headings, MD sent at 01/06/2019  6:41 PM EDT ----- Please refer to PV

## 2019-01-07 NOTE — Assessment & Plan Note (Signed)
CXR has cleared ( 5/26) Completed Augmentin and short course of prednisone Did have worsening dyspnea 5/26 Endorses clinical improvement over the last week Continued RLL crackles/ dense rhonchi Plan: CTA to evaluate RLL further and r/o PE  We will call you with the results Follow-up in office in 4 weeks with Wyn Quaker or Dr. Lidia Collum our office sooner if you are having worsened breathing or worsening clinical symptoms Follow up in 1 month with Wyn Quaker or Dr. Lake Bells

## 2019-01-07 NOTE — Telephone Encounter (Signed)
Follow Up:; ° ° °Returning your call. °

## 2019-01-08 ENCOUNTER — Other Ambulatory Visit: Payer: Self-pay

## 2019-01-08 ENCOUNTER — Other Ambulatory Visit: Payer: Medicare Other

## 2019-01-08 ENCOUNTER — Ambulatory Visit (HOSPITAL_COMMUNITY)
Admission: RE | Admit: 2019-01-08 | Discharge: 2019-01-08 | Disposition: A | Discharge status: Home / Self Care | Payer: Medicare Other | Source: Ambulatory Visit | Attending: Pulmonary Disease | Admitting: Pulmonary Disease

## 2019-01-08 ENCOUNTER — Encounter (HOSPITAL_COMMUNITY)
Admission: RE | Admit: 2019-01-08 | Discharge: 2019-01-08 | Disposition: A | Discharge status: Home / Self Care | Payer: Medicare Other | Source: Ambulatory Visit | Attending: Pulmonary Disease | Admitting: Pulmonary Disease

## 2019-01-08 ENCOUNTER — Other Ambulatory Visit: Payer: Self-pay | Admitting: Pulmonary Disease

## 2019-01-08 DIAGNOSIS — N186 End stage renal disease: Secondary | ICD-10-CM | POA: Diagnosis not present

## 2019-01-08 DIAGNOSIS — R05 Cough: Secondary | ICD-10-CM | POA: Diagnosis not present

## 2019-01-08 DIAGNOSIS — J181 Lobar pneumonia, unspecified organism: Secondary | ICD-10-CM | POA: Diagnosis not present

## 2019-01-08 DIAGNOSIS — R7989 Other specified abnormal findings of blood chemistry: Secondary | ICD-10-CM

## 2019-01-08 DIAGNOSIS — R918 Other nonspecific abnormal finding of lung field: Secondary | ICD-10-CM | POA: Diagnosis not present

## 2019-01-08 DIAGNOSIS — E1129 Type 2 diabetes mellitus with other diabetic kidney complication: Secondary | ICD-10-CM | POA: Diagnosis not present

## 2019-01-08 DIAGNOSIS — D631 Anemia in chronic kidney disease: Secondary | ICD-10-CM | POA: Diagnosis not present

## 2019-01-08 DIAGNOSIS — D509 Iron deficiency anemia, unspecified: Secondary | ICD-10-CM | POA: Diagnosis not present

## 2019-01-08 DIAGNOSIS — N2581 Secondary hyperparathyroidism of renal origin: Secondary | ICD-10-CM | POA: Diagnosis not present

## 2019-01-08 DIAGNOSIS — R0602 Shortness of breath: Secondary | ICD-10-CM | POA: Diagnosis not present

## 2019-01-08 MED ORDER — TECHNETIUM TO 99M ALBUMIN AGGREGATED
1.4000 | Freq: Once | INTRAVENOUS | Status: AC | PRN
  Administered 2019-01-08: 1.4 via INTRAVENOUS

## 2019-01-08 NOTE — Addendum Note (Signed)
Addended by: Suzzanne Cloud E on: 01/08/2019 08:50 AM   Modules accepted: Orders

## 2019-01-08 NOTE — Progress Notes (Signed)
VQ scan negative. This is good news.   Wyn Quaker FNP

## 2019-01-09 NOTE — Progress Notes (Signed)
Reviewed, agree 

## 2019-01-11 DIAGNOSIS — E1129 Type 2 diabetes mellitus with other diabetic kidney complication: Secondary | ICD-10-CM | POA: Diagnosis not present

## 2019-01-11 DIAGNOSIS — N186 End stage renal disease: Secondary | ICD-10-CM | POA: Diagnosis not present

## 2019-01-11 DIAGNOSIS — J449 Chronic obstructive pulmonary disease, unspecified: Secondary | ICD-10-CM | POA: Diagnosis not present

## 2019-01-11 DIAGNOSIS — R0902 Hypoxemia: Secondary | ICD-10-CM | POA: Diagnosis not present

## 2019-01-11 DIAGNOSIS — D631 Anemia in chronic kidney disease: Secondary | ICD-10-CM | POA: Diagnosis not present

## 2019-01-11 DIAGNOSIS — D509 Iron deficiency anemia, unspecified: Secondary | ICD-10-CM | POA: Diagnosis not present

## 2019-01-11 DIAGNOSIS — N2581 Secondary hyperparathyroidism of renal origin: Secondary | ICD-10-CM | POA: Diagnosis not present

## 2019-01-11 NOTE — Progress Notes (Signed)
Chest x-ray negative, V/Q negative.  Nothing further needed at this time.  Keep scheduled follow-up with our office.  Wyn Quaker, FNP

## 2019-01-12 ENCOUNTER — Other Ambulatory Visit: Payer: Self-pay | Admitting: Internal Medicine

## 2019-01-12 DIAGNOSIS — E291 Testicular hypofunction: Secondary | ICD-10-CM | POA: Diagnosis not present

## 2019-01-12 DIAGNOSIS — F5101 Primary insomnia: Secondary | ICD-10-CM

## 2019-01-12 NOTE — Telephone Encounter (Signed)
[place] Controlled Database Checked Last filled: 12/11/18 # 30 LOV w/you: 05/26/18 Next appt w/you: None

## 2019-01-13 ENCOUNTER — Telehealth: Payer: Self-pay | Admitting: Cardiovascular Disease

## 2019-01-13 DIAGNOSIS — D509 Iron deficiency anemia, unspecified: Secondary | ICD-10-CM | POA: Diagnosis not present

## 2019-01-13 DIAGNOSIS — N2581 Secondary hyperparathyroidism of renal origin: Secondary | ICD-10-CM | POA: Diagnosis not present

## 2019-01-13 DIAGNOSIS — D631 Anemia in chronic kidney disease: Secondary | ICD-10-CM | POA: Diagnosis not present

## 2019-01-13 DIAGNOSIS — N186 End stage renal disease: Secondary | ICD-10-CM | POA: Diagnosis not present

## 2019-01-13 DIAGNOSIS — E1129 Type 2 diabetes mellitus with other diabetic kidney complication: Secondary | ICD-10-CM | POA: Diagnosis not present

## 2019-01-13 NOTE — Telephone Encounter (Signed)
New Message     Pt is calling and wants to know if at his appointment with Dr Gwenlyn Found if they will do an angioplasty     Please call back

## 2019-01-13 NOTE — Telephone Encounter (Signed)
Left message to call back  

## 2019-01-14 ENCOUNTER — Ambulatory Visit: Payer: Medicare Other | Admitting: Pulmonary Disease

## 2019-01-14 ENCOUNTER — Telehealth: Payer: Self-pay | Admitting: Acute Care

## 2019-01-14 DIAGNOSIS — G4734 Idiopathic sleep related nonobstructive alveolar hypoventilation: Secondary | ICD-10-CM

## 2019-01-14 NOTE — Telephone Encounter (Signed)
Spoke with pt, aware the appointment is a consult and any procedures needed would be scheduled that day.

## 2019-01-14 NOTE — Telephone Encounter (Signed)
Per Judson Roch >> pt needs to start on O2 qhs 2L per his recent ONO.  Spoke with pt. He is aware of his results. Order has been placed for nocturnal oxygen. Nothing further was needed.

## 2019-01-14 NOTE — Addendum Note (Signed)
Addended by: Desmond Dike C on: 01/14/2019 02:07 PM   Modules accepted: Orders

## 2019-01-14 NOTE — Telephone Encounter (Signed)
Left message for pt to call.

## 2019-01-14 NOTE — Telephone Encounter (Signed)
Follow up  ° ° °Pt is returning call  ° ° °Please call back  °

## 2019-01-15 DIAGNOSIS — E1129 Type 2 diabetes mellitus with other diabetic kidney complication: Secondary | ICD-10-CM | POA: Diagnosis not present

## 2019-01-15 DIAGNOSIS — D631 Anemia in chronic kidney disease: Secondary | ICD-10-CM | POA: Diagnosis not present

## 2019-01-15 DIAGNOSIS — N186 End stage renal disease: Secondary | ICD-10-CM | POA: Diagnosis not present

## 2019-01-15 DIAGNOSIS — N2581 Secondary hyperparathyroidism of renal origin: Secondary | ICD-10-CM | POA: Diagnosis not present

## 2019-01-15 DIAGNOSIS — D509 Iron deficiency anemia, unspecified: Secondary | ICD-10-CM | POA: Diagnosis not present

## 2019-01-18 DIAGNOSIS — D631 Anemia in chronic kidney disease: Secondary | ICD-10-CM | POA: Diagnosis not present

## 2019-01-18 DIAGNOSIS — H6123 Impacted cerumen, bilateral: Secondary | ICD-10-CM | POA: Diagnosis not present

## 2019-01-18 DIAGNOSIS — D509 Iron deficiency anemia, unspecified: Secondary | ICD-10-CM | POA: Diagnosis not present

## 2019-01-18 DIAGNOSIS — N2581 Secondary hyperparathyroidism of renal origin: Secondary | ICD-10-CM | POA: Diagnosis not present

## 2019-01-18 DIAGNOSIS — N186 End stage renal disease: Secondary | ICD-10-CM | POA: Diagnosis not present

## 2019-01-18 DIAGNOSIS — E1129 Type 2 diabetes mellitus with other diabetic kidney complication: Secondary | ICD-10-CM | POA: Diagnosis not present

## 2019-01-19 ENCOUNTER — Telehealth: Payer: Self-pay | Admitting: Acute Care

## 2019-01-19 ENCOUNTER — Ambulatory Visit (INDEPENDENT_AMBULATORY_CARE_PROVIDER_SITE_OTHER): Payer: Medicare Other

## 2019-01-19 DIAGNOSIS — J4540 Moderate persistent asthma, uncomplicated: Secondary | ICD-10-CM | POA: Diagnosis not present

## 2019-01-19 MED ORDER — OMALIZUMAB 150 MG/ML ~~LOC~~ SOSY
300.0000 mg | PREFILLED_SYRINGE | Freq: Once | SUBCUTANEOUS | Status: AC
  Administered 2019-01-19: 17:00:00 300 mg via SUBCUTANEOUS

## 2019-01-19 MED ORDER — OMALIZUMAB 75 MG/0.5ML ~~LOC~~ SOSY: 75.0000 mg | PREFILLED_SYRINGE | Freq: Once | SUBCUTANEOUS | Status: DC

## 2019-01-19 NOTE — Progress Notes (Signed)
Have you been hospitalized within the last 10 days?  No Do you have a fever?  No Do you have a cough?  Yes "Cough is about gone."(per pt) Do you have a headache or sore throat? No

## 2019-01-19 NOTE — Telephone Encounter (Signed)
SPoke with Shane [name], [name] received the order and will be calling the pt today or tomorrow. To set up the oxygen. I called pt to let him know and he understood. Nothing further is needed.

## 2019-01-20 DIAGNOSIS — D509 Iron deficiency anemia, unspecified: Secondary | ICD-10-CM | POA: Diagnosis not present

## 2019-01-20 DIAGNOSIS — N186 End stage renal disease: Secondary | ICD-10-CM | POA: Diagnosis not present

## 2019-01-20 DIAGNOSIS — D631 Anemia in chronic kidney disease: Secondary | ICD-10-CM | POA: Diagnosis not present

## 2019-01-20 DIAGNOSIS — E1129 Type 2 diabetes mellitus with other diabetic kidney complication: Secondary | ICD-10-CM | POA: Diagnosis not present

## 2019-01-20 DIAGNOSIS — N2581 Secondary hyperparathyroidism of renal origin: Secondary | ICD-10-CM | POA: Diagnosis not present

## 2019-01-21 ENCOUNTER — Telehealth: Payer: Self-pay

## 2019-01-21 ENCOUNTER — Other Ambulatory Visit: Payer: Self-pay

## 2019-01-21 ENCOUNTER — Telehealth (INDEPENDENT_AMBULATORY_CARE_PROVIDER_SITE_OTHER): Payer: Medicare Other | Admitting: Cardiovascular Disease

## 2019-01-21 VITALS — BP 160/73 | HR 96 | Ht 70.0 in | Wt 196.0 lb

## 2019-01-21 DIAGNOSIS — Z01812 Encounter for preprocedural laboratory examination: Secondary | ICD-10-CM | POA: Diagnosis not present

## 2019-01-21 DIAGNOSIS — Z01810 Encounter for preprocedural cardiovascular examination: Secondary | ICD-10-CM

## 2019-01-21 DIAGNOSIS — Z8679 Personal history of other diseases of the circulatory system: Secondary | ICD-10-CM

## 2019-01-21 DIAGNOSIS — I10 Essential (primary) hypertension: Secondary | ICD-10-CM

## 2019-01-21 DIAGNOSIS — I739 Peripheral vascular disease, unspecified: Secondary | ICD-10-CM | POA: Diagnosis not present

## 2019-01-21 NOTE — Progress Notes (Signed)
Virtual Visit via Video Note   This visit type was conducted due to national recommendations for restrictions regarding the COVID-19 Pandemic (e.g. social distancing) in an effort to limit this patient's exposure and mitigate transmission in our community.  Due to his co-morbid illnesses, this patient is at least at moderate risk for complications without adequate follow up.  This format is felt to be most appropriate for this patient at this time.  All issues noted in this document were discussed and addressed.  A limited physical exam was performed with this format.  Please refer to the patient's chart for his consent to telehealth for Shane [name].   Date:  01/21/2019   ID:  Shane [name], DOB 1934-05-29, MRN [id number]  Patient Location: Home Provider Location: Home  PCP:  [name], MD  Cardiologist:  Mertie Moores, MD  Peripheral vascular cardiologist- Dr. [name] Electrophysiologist:  None   Evaluation Performed:  New Patient Evaluation  Chief Complaint: Claudication  History of Present Illness:    Shane [name] is a 85 y.o. married Caucasian male father of 2 children, grandfather 2 grandchildren his brother Herbie [place] is also a patient of mine.  He is accompanied by his wife today.  He was referred by Dr. Adah Perl, his podiatrist and Dr. Acie Fredrickson, his cardiologist for peripheral vascular valuation because of claudication.  His risk factors include history of hypertension, hyperlipidemia and type 2 diabetes.  He has a history of ischemic heart disease status post remote CABG by Dr. Merleen Nicely in 1986 with ischemic cardiomyopathy status post ICD implantation by Dr. Caryl Comes.  He has chronic renal insufficiency and has been on hemodialysis since April of last year currently followed by Dr. Jimmy Footman.  He has had left lower extremity discomfort/claudication for several months with recent Dopplers performed 01/05/2019 revealing a left ABI 0.64 with what appears to be an  occluded left common iliac artery.  This is lifestyle limiting.  The patient does not have symptoms concerning for COVID-19 infection (fever, chills, cough, or new shortness of breath).    Past Medical History:  Diagnosis Date   Abnormal nuclear cardiac imaging test Nov 2010   moderate area of infarct in the inferior wall with only minimal reversibility and EF of 28%   AICD (automatic cardioverter/defibrillator) present    Allergic rhinitis 01-08-13   Uses nebulizer for chronic sinus issues and Mucinex.   Arthritis    osteoarthritis    Asthma    Extrinic   Blood transfusion    ? at time of bypass surgery    BPH (benign prostatic hyperplasia)    Cellulitis of arm, left    MSSA   CHF (congestive heart failure) ([place])    CKD (chronic kidney disease), stage III ([place])    "was told due to meds he takes"-no Renal workup done   Colon polyps    adenomatous   Coronary artery disease    remote CABG in 1985, cath in 2003 by Dr. Lia Foyer with no PCI, last nuclear in 2010 showing scar and EF of 28%.    Diabetes mellitus    Diabetic neuropathy ([name]) 04/25/2017   Dyslipidemia    Dyspnea    Gait abnormality 04/25/2017   Glaucoma 01-08-13   tx. eye drops   Hemodialysis patient ([place])    [place] (hard of hearing)    HTN (hypertension)    Hypercholesterolemia    Left ventricular dysfunction    28% per nuclear in 2010 and 35 to 40% per echo  in 2010   Myocardial infarction [facility])    1985   Neuromuscular disorder ([place])    legs mild paralysis-able to walk"nerve damage"-legs- left leg brace    Neuropathy    Pneumonia    hx of several times years ago    Spinal stenosis    Past Surgical History:  Procedure Laterality Date   A/V FISTULAGRAM Right 10/17/2016   Procedure: A/V Fistulagram;  Surgeon: Conrad Karnes, MD;  Location: [place] CV LAB;  Service: Cardiovascular;  Laterality: Right;   BACK SURGERY     hx of back surgery x 4    BASCILIC VEIN TRANSPOSITION Right  09/09/2016   Procedure: BASCILIC VEIN TRANSPOSITION Right Arm;  Surgeon: Rosetta Posner, MD;  [facility];  Service: Vascular;  Laterality: Right;   BI-VENTRICULAR IMPLANTABLE CARDIOVERTER DEFIBRILLATOR N/A 10/10/2014   Procedure: BI-VENTRICULAR IMPLANTABLE CARDIOVERTER DEFIBRILLATOR  (CRT-D);  Surgeon: [name], MD;  Location: [facility] CATH LAB;  Service: Cardiovascular;  Laterality: N/A;   CARDIAC CATHETERIZATION  2003   Cataract      recent cataract surgery 6'14   COLONOSCOPY N/A 01/25/2013   Procedure: COLONOSCOPY;  Surgeon: Irene Shipper, MD;  Location: [facility] ENDOSCOPY;  Service: Endoscopy;  Laterality: N/A;   CORONARY ARTERY BYPASS GRAFT  1985   x 5 vessels   LUMBAR DISC SURGERY     PERIPHERAL VASCULAR BALLOON ANGIOPLASTY Right 10/17/2016   Procedure: Peripheral Vascular Balloon Angioplasty;  Surgeon: Conrad Cascade, MD;  Location: [place] CV LAB;  Service: Cardiovascular;  Laterality: Right;  ARM VEINOUS AND CENTRAL VEIN   PR POLYSOM 6/>YRS SLEEP 4/> ADDL PARAM ATTND  12/07/2015   PROSTATE SURGERY     TOTAL KNEE ARTHROPLASTY  08/01/2011   Procedure: TOTAL KNEE ARTHROPLASTY;  Surgeon: Johnn Hai;  Location: [facility] ORS;  Service: Orthopedics;  Laterality: Right;     Current Meds  Medication Sig   albuterol (PROVENTIL) (2.5 MG/3ML) 0.083% nebulizer solution Take 3 mLs (2.5 mg total) by nebulization every 6 (six) hours as needed for wheezing or shortness of breath.   Albuterol Sulfate (PROAIR RESPICLICK) 161 (90 Base) MCG/ACT AEPB Inhale 1-2 puffs into the lungs every 6 (six) hours as needed.   allopurinol (ZYLOPRIM) 100 MG tablet Take 1 tablet (100 mg total) by mouth daily.   amiodarone (PACERONE) 200 MG tablet Take 1 tablet (200 mg total) by mouth daily.   ammonium lactate (AMLACTIN) 12 % lotion Apply 1 application topically 2 (two) times daily. (Patient taking differently: Apply 1 application topically as needed. )   aspirin EC 81 MG tablet Take 81 mg by mouth daily.     atorvastatin (LIPITOR) 10 MG tablet TAKE 1 TABLET DAILY   B Complex-C-Folic Acid (RENAL-VITE) 0.8 MG TABS Rena-Vite 0.8 mg tablet   calcitRIOL (ROCALTROL) 0.25 MCG capsule Take 1 capsule (0.25 mcg total) by mouth every Monday, Wednesday, and Friday with hemodialysis.   calcium acetate (PHOSLO) 667 MG capsule TAKE 2 CAPSULES BY MOUTH THREE TIMES DAILY WITH MEALS AND 1 TWICE DAILY WITH SNACKS   cetirizine (ZYRTEC) 10 MG tablet Take 10 mg by mouth at bedtime as needed (seasonal allergies).    clotrimazole-betamethasone (LOTRISONE) lotion Apply to left foot twice daily   dextromethorphan-guaiFENesin (MUCINEX DM) 30-600 MG 12hr tablet Take 1 tablet by mouth 2 (two) times daily as needed for cough.   dicyclomine (BENTYL) 10 MG capsule TAKE 1 CAPSULE BY MOUTH THREE TIMES DAILY AS NEEDED USE SPARINGLY   fluocinonide cream (LIDEX) 0.05 %  Apply 1 application topically 2 (two) times daily as needed (for itchy rash).    fluticasone furoate-vilanterol (BREO ELLIPTA) 200-25 MCG/INH AEPB Inhale 1 puff into the lungs daily.   glipiZIDE (GLUCOTROL XL) 2.5 MG 24 hr tablet Take 5 mg by mouth daily.    lidocaine-prilocaine (EMLA) cream Apply 1 application topically daily as needed for painful procedure/intervention.   lidocaine-prilocaine (EMLA) cream lidocaine-prilocaine 2.5 %-2.5 % topical cream   multivitamin (RENA-VIT) TABS tablet Take 1 tablet by mouth at bedtime.   polyethylene glycol (MIRALAX / GLYCOLAX) packet Take 17 g by mouth daily as needed for mild constipation.   Syringe/Needle, Disp, (SYRINGE 3CC/22GX1") 22G X 1" 3 ML MISC Use to give injection   tadalafil (CIALIS) 20 MG tablet Take 20 mg by mouth daily as needed for erectile dysfunction. Do not exceed 2 doses in 7 days   testosterone cypionate (DEPOTESTOTERONE CYPIONATE) 100 MG/ML injection Inject 400 mg into the muscle every 21 ( twenty-one) days. For IM use only   Testosterone Cypionate 100 MG/ML SOLN Inject into the muscle.    Testosterone Cypionate 100 MG/ML SOLN Inject into the muscle.   triamcinolone (NASACORT AQ) 55 MCG/ACT AERO nasal inhaler Place 2 sprays into the nose daily as needed (seasonal allergies).    zolpidem (AMBIEN) 10 MG tablet TAKE 1/2 (ONE-HALF) TABLET BY MOUTH AT BEDTIME TAKE  AN  ADDITIONAL  1/2  TABLET  IF  NEEDED  FOR  SLEEP   Current Facility-Administered Medications for the 01/21/19 encounter (Telemedicine) with Lorretta Harp, MD  Medication   omalizumab Arvid Right) injection 300 mg   omalizumab Arvid Right) injection 300 mg   omalizumab Arvid Right) prefilled syringe 75 mg     Allergies:   Codeine, Hydrocodone, Pseudoephedrine, and Azithromycin   Social History   Tobacco Use   Smoking status: Former Smoker    Packs/day: 1.00    Years: 10.00    Pack years: 10.00    Types: Cigarettes, Pipe, Cigars    Start date: 12/02/1949    Quit date: 08/05/1958    Years since quitting: 60.5   Smokeless tobacco: Never Used  Substance Use Topics   Alcohol use: No   Drug use: No     Family Hx: The patient's family history includes Bone cancer in his brother; Colon polyps in his father; Diabetes in his sister; Heart attack in his father; Heart disease in his brother and father; Lung cancer in his mother. There is no history of Lung disease.  ROS:   Please see the history of present illness.     All other systems reviewed and are negative.   Prior CV studies:   The following studies were reviewed today:  Lower extremity arterial Doppler studies performed 01/05/2019  Labs/Other Tests and Data Reviewed:    EKG:  No ECG reviewed.  Recent Labs: 04/21/2018: ALT 22; TSH 1.100 12/29/2018: Hemoglobin 10.3; Platelets 81.0; Pro B Natriuretic peptide (BNP) 2,417.0   Recent Lipid Panel Lab Results  Component Value Date/Time   CHOL 104 (L) 09/18/2015 09:55 AM   TRIG 97 09/18/2015 09:55 AM   HDL 25 (L) 09/18/2015 09:55 AM   CHOLHDL 4.2 09/18/2015 09:55 AM   LDLCALC 60 09/18/2015 09:55 AM     Wt Readings from Last 3 Encounters:  01/21/19 196 lb (88.9 kg)  01/07/19 203 lb 6.4 oz (92.3 kg)  12/29/18 203 lb 12.8 oz (92.4 kg)     Objective:    Vital Signs:  BP (!) 160/73    Pulse 96  Ht 5\' 10"  (1.778 m)    Wt 196 lb (88.9 kg)    BMI 28.12 kg/m    VITAL SIGNS:  reviewed GEN:  no acute distress RESPIRATORY:  normal respiratory effort, symmetric expansion NEURO:  alert and oriented x 3, no obvious focal deficit PSYCH:  normal affect  ASSESSMENT & PLAN:    1. Peripheral arterial disease- history of left lower extremity claudication with recent Dopplers performed 01/05/2019 revealing left ABI 0.64 with an occluded left iliac.  He is on hemodialysis.  He wishes to pursue endovascular revascularization for lifestyle improvement.  I will arrange to do this a week from next Thursday.  COVID-19 Education: The signs and symptoms of COVID-19 were discussed with the patient and how to seek care for testing (follow up with PCP or arrange E-visit).  The importance of social distancing was discussed today.  Time:   Today, I have spent 10 minutes with the patient with telehealth technology discussing the above problems.     Medication Adjustments/Labs and Tests Ordered: Current medicines are reviewed at length with the patient today.  Concerns regarding medicines are outlined above.   Tests Ordered: No orders of the defined types were placed in this encounter.   Medication Changes: No orders of the defined types were placed in this encounter.   Follow Up:  In Person in 1 month(s)  Signed, [name], MD  01/21/2019 10:14 AM    [place]

## 2019-01-21 NOTE — Telephone Encounter (Signed)
Patient and/or DPR-approved person aware of 6/18 AVS instructions and verbalized understanding.  Letter including After Visit Summary and any other necessary documents to be mailed to the patient's address on file.

## 2019-01-21 NOTE — Patient Instructions (Signed)
[place] [address] Dept: [phone] Loc: [phone]  Shane [name]  01/21/2019  You are scheduled for a Peripheral Angiogram on Thursday, July 2 with Dr. [name].  1. Please arrive at the [facility] (Main Entrance A) [facility] [address] [place], [place] [address] at 5:30 AM (This time is two hours before your procedure to ensure your preparation). Free valet parking service is available.   Special note: Every effort is made to have your procedure done on time. Please understand that emergencies sometimes delay scheduled procedures.  2. Diet: Do not eat solid foods after midnight.  The patient may have clear liquids until 5am upon the day of the procedure.  3. Labs: You will need to have blood drawn and have a COVID-19 test done on 02/01/2019.  Go to:  [facility] at [employer] [facility] [address], [place], [place] [address] FOR YOUR BASIC METABOLIC PANEL, COMPLETE BLOOD COUNT, AND THYROID STIMULATING HORMONE LAB WORK. NO APPOINTMENT IS NEEDED. YOU MUST HAVE THIS LAB WORK DONE BEFORE GOING TO GET YOUR COVID-19 TEST DONE BECAUSE YOU WILL NEED TO QUARANTINE YOURSELF AFTER THE COVID-19 TEST UNTIL THE DAY OF YOUR [place].  Go to: [facility]  [address] [place], [place] [address] FOR YOUR COVID-19 TEST. YOU MUST HAVE YOUR COVID-19 TEST COMPLETED 3 DAYS PRIOR TO YOUR UPCOMING PROCEDURE/TEST. YOUR APPOINTMENT IS 02/01/2019 AT 10:30AM. YOU WILL ALSO NEED TO QUARANTINE YOURSELF AFTER THE COVID-19 TEST UNTIL THE DAY OF YOUR PROCEDURE/TEST. RESULTS WILL  BE POSTED IN OUR SYSTEM IN 48 HOURS OR LESS.   4. Medication instructions in preparation for your procedure:  DO NOT take your glipizide (Glucotrol XL) on the morning of your procedure.  On the morning of your procedure, take your  Aspirin and any morning medicines NOT listed above.  You may use sips of water.  5. Plan for one night stay--bring personal belongings. 6. Bring a current list of your medications and current insurance cards. 7. You MUST have a responsible person to drive you home. 8. Someone MUST be with you the first 24 hours after you arrive home or your discharge will be delayed. 9. Please wear clothes that are easy to get on and off and wear slip-on shoes.   Testing/Procedures: Your physician has requested that you have a lower or upper extremity arterial duplex. This test is an ultrasound of the arteries in the legs or arms. It looks at arterial blood flow in the legs and arms. Allow one hour for Lower and Upper Arterial scans. There are no restrictions or special instructions. TO BE SCHEDULED 2-3 WEEKS AFTER YOUR PROCEDURE. YOU WILL BE CONTACTED BY A SCHEDULER TO SET UP THIS APPOINTMENT.  Your physician has requested that you have an ankle brachial index (ABI). During this test an ultrasound and blood pressure cuff are used to evaluate the arteries that supply the arms and legs with blood. Allow thirty minutes for this exam. There are no restrictions or special instructions. TO BE SCHEDULED 2-3 WEEKS AFTER YOUR PROCEDURE. YOU WILL BE CONTACTED BY A SCHEDULER TO SET UP THIS APPOINTMENT.  Follow-Up: At [facility], you and your health needs are our priority.  As part of our continuing mission to provide you with exceptional heart care, we have created designated Provider Care Teams.  These Care Teams include your primary Cardiologist (physician) and  Advanced Practice Providers (APPs -  Physician Assistants and Nurse Practitioners) who all work together to provide you with the care you need, when you need it. You will need a follow up appointment 3 weeks AFTER YOUR PROCEDURE with Dr. Gwenlyn Found. PLEASE HAVE YOUR ULTRASOUNDS COMPLETED BEFORE THIS APPOINTMENT.   Thank you for allowing [place] to care for you!   --  [facility]

## 2019-01-21 NOTE — Telephone Encounter (Signed)
Spoke with pt about inpatient dialysis inquiry. Pt has dialysis M-W-F and is supposed to have outpatient dialysis Friday 7/3. Pt stated he was supposed to have dialysis appt on 7/3 but will cancel because Dr. Gwenlyn Found told him he would stay overnight and go to inpatient dialysis.   Contacted cath lab regarding to make aware pt needs hemodialysis. Staff message to be sent to Bernardo Heater, RN regarding setting pt up for inpatient hemodialysis.   Pt requested that COVID-19 appt time be changed to 3pm. Updated in Epic and pt aware that it is 6/29 at 3pm.

## 2019-01-21 NOTE — H&P (View-Only) (Signed)
Virtual Visit via Video Note   This visit type was conducted due to national recommendations for restrictions regarding the COVID-19 Pandemic (e.g. social distancing) in an effort to limit this patient's exposure and mitigate transmission in our community.  Due to his co-morbid illnesses, this patient is at least at moderate risk for complications without adequate follow up.  This format is felt to be most appropriate for this patient at this time.  All issues noted in this document were discussed and addressed.  A limited physical exam was performed with this format.  Please refer to the patient's chart for his consent to telehealth for [facility].   Date:  01/21/2019   ID:  [name], DOB Aug 23, 1933, MRN [id number]  Patient Location: Home Provider Location: Home  PCP:  [name], MD  Cardiologist:  Mertie Moores, MD  Peripheral vascular cardiologist- Dr. [name] Electrophysiologist:  None   Evaluation Performed:  New Patient Evaluation  Chief Complaint: Claudication  History of Present Illness:    Shane [name] is a 85 y.o. married Caucasian male father of 2 children, grandfather 2 grandchildren his brother Herbie [place] is also a patient of mine.  He is accompanied by his wife today.  He was referred by Dr. Adah Perl, his podiatrist and Dr. Acie Fredrickson, his cardiologist for peripheral vascular valuation because of claudication.  His risk factors include history of hypertension, hyperlipidemia and type 2 diabetes.  He has a history of ischemic heart disease status post remote CABG by Dr. Merleen Nicely in 1986 with ischemic cardiomyopathy status post ICD implantation by Dr. Caryl Comes.  He has chronic renal insufficiency and has been on hemodialysis since April of last year currently followed by Dr. Jimmy Footman.  He has had left lower extremity discomfort/claudication for several months with recent Dopplers performed 01/05/2019 revealing a left ABI 0.64 with what appears to be an  occluded left common iliac artery.  This is lifestyle limiting.  The patient does not have symptoms concerning for COVID-19 infection (fever, chills, cough, or new shortness of breath).    Past Medical History:  Diagnosis Date   Abnormal nuclear cardiac imaging test Nov 2010   moderate area of infarct in the inferior wall with only minimal reversibility and EF of 28%   AICD (automatic cardioverter/defibrillator) present    Allergic rhinitis 01-08-13   Uses nebulizer for chronic sinus issues and Mucinex.   Arthritis    osteoarthritis    Asthma    Extrinic   Blood transfusion    ? at time of bypass surgery    BPH (benign prostatic hyperplasia)    Cellulitis of arm, left    MSSA   CHF (congestive heart failure) ([place])    CKD (chronic kidney disease), stage III ([place])    "was told due to meds he takes"-no Renal workup done   Colon polyps    adenomatous   Coronary artery disease    remote CABG in 1985, cath in 2003 by Dr. Lia Foyer with no PCI, last nuclear in 2010 showing scar and EF of 28%.    Diabetes mellitus    Diabetic neuropathy ([place]) 04/25/2017   Dyslipidemia    Dyspnea    Gait abnormality 04/25/2017   Glaucoma 01-08-13   tx. eye drops   Hemodialysis patient ([place])    [place] (hard of hearing)    HTN (hypertension)    Hypercholesterolemia    Left ventricular dysfunction    28% per nuclear in 2010 and 35 to 40% per echo  in 2010   Myocardial infarction [facility])    1985   Neuromuscular disorder ([place])    legs mild paralysis-able to walk"nerve damage"-legs- left leg brace    Neuropathy    Pneumonia    hx of several times years ago    Spinal stenosis    Past Surgical History:  Procedure Laterality Date   A/V FISTULAGRAM Right 10/17/2016   Procedure: A/V Fistulagram;  Surgeon: Conrad Le Sueur, MD;  Location: [facility];  Service: Cardiovascular;  Laterality: Right;   BACK SURGERY     hx of back surgery x 4    BASCILIC VEIN TRANSPOSITION Right  09/09/2016   Procedure: BASCILIC VEIN TRANSPOSITION Right Arm;  Surgeon: Rosetta Posner, MD;  Location[facility] OR;  Service: Vascular;  Laterality: Right;   BI-VENTRICULAR IMPLANTABLE CARDIOVERTER DEFIBRILLATOR N/A 10/10/2014   Procedure: BI-VENTRICULAR IMPLANTABLE CARDIOVERTER DEFIBRILLATOR  (CRT-D);  Surgeon: [name], MD;  Location: [facility] CATH LAB;  Service: Cardiovascular;  Laterality: N/A;   CARDIAC CATHETERIZATION  2003   Cataract      recent cataract surgery 6'14   COLONOSCOPY N/A 01/25/2013   Procedure: COLONOSCOPY;  Surgeon: Irene Shipper, MD;  Location: [facility] ENDOSCOPY;  Service: Endoscopy;  Laterality: N/A;   CORONARY ARTERY BYPASS GRAFT  1985   x 5 vessels   LUMBAR DISC SURGERY     PERIPHERAL VASCULAR BALLOON ANGIOPLASTY Right 10/17/2016   Procedure: Peripheral Vascular Balloon Angioplasty;  Surgeon: Conrad Adrian, MD;  Location: [facility];  Service: Cardiovascular;  Laterality: Right;  ARM VEINOUS AND CENTRAL VEIN   PR POLYSOM 6/>YRS SLEEP 4/> ADDL PARAM ATTND  12/07/2015   PROSTATE SURGERY     TOTAL KNEE ARTHROPLASTY  08/01/2011   Procedure: TOTAL KNEE ARTHROPLASTY;  Surgeon: Johnn Hai;  Location: [facility] ORS;  Service: Orthopedics;  Laterality: Right;     Current Meds  Medication Sig   albuterol (PROVENTIL) (2.5 MG/3ML) 0.083% nebulizer solution Take 3 mLs (2.5 mg total) by nebulization every 6 (six) hours as needed for wheezing or shortness of breath.   Albuterol Sulfate (PROAIR RESPICLICK) 378 (90 Base) MCG/ACT AEPB Inhale 1-2 puffs into the lungs every 6 (six) hours as needed.   allopurinol (ZYLOPRIM) 100 MG tablet Take 1 tablet (100 mg total) by mouth daily.   amiodarone (PACERONE) 200 MG tablet Take 1 tablet (200 mg total) by mouth daily.   ammonium lactate (AMLACTIN) 12 % lotion Apply 1 application topically 2 (two) times daily. (Patient taking differently: Apply 1 application topically as needed. )   aspirin EC 81 MG tablet Take 81 mg by mouth daily.     atorvastatin (LIPITOR) 10 MG tablet TAKE 1 TABLET DAILY   B Complex-C-Folic Acid (RENAL-VITE) 0.8 MG TABS Rena-Vite 0.8 mg tablet   calcitRIOL (ROCALTROL) 0.25 MCG capsule Take 1 capsule (0.25 mcg total) by mouth every Monday, Wednesday, and Friday with hemodialysis.   calcium acetate (PHOSLO) 667 MG capsule TAKE 2 CAPSULES BY MOUTH THREE TIMES DAILY WITH MEALS AND 1 TWICE DAILY WITH SNACKS   cetirizine (ZYRTEC) 10 MG tablet Take 10 mg by mouth at bedtime as needed (seasonal allergies).    clotrimazole-betamethasone (LOTRISONE) lotion Apply to left foot twice daily   dextromethorphan-guaiFENesin (MUCINEX DM) 30-600 MG 12hr tablet Take 1 tablet by mouth 2 (two) times daily as needed for cough.   dicyclomine (BENTYL) 10 MG capsule TAKE 1 CAPSULE BY MOUTH THREE TIMES DAILY AS NEEDED USE SPARINGLY   fluocinonide cream (LIDEX) 0.05 %  Apply 1 application topically 2 (two) times daily as needed (for itchy rash).    fluticasone furoate-vilanterol (BREO ELLIPTA) 200-25 MCG/INH AEPB Inhale 1 puff into the lungs daily.   glipiZIDE (GLUCOTROL XL) 2.5 MG 24 hr tablet Take 5 mg by mouth daily.    lidocaine-prilocaine (EMLA) cream Apply 1 application topically daily as needed for painful procedure/intervention.   lidocaine-prilocaine (EMLA) cream lidocaine-prilocaine 2.5 %-2.5 % topical cream   multivitamin (RENA-VIT) TABS tablet Take 1 tablet by mouth at bedtime.   polyethylene glycol (MIRALAX / GLYCOLAX) packet Take 17 g by mouth daily as needed for mild constipation.   Syringe/Needle, Disp, (SYRINGE 3CC/22GX1") 22G X 1" 3 ML MISC Use to give injection   tadalafil (CIALIS) 20 MG tablet Take 20 mg by mouth daily as needed for erectile dysfunction. Do not exceed 2 doses in 7 days   testosterone cypionate (DEPOTESTOTERONE CYPIONATE) 100 MG/ML injection Inject 400 mg into the muscle every 21 ( twenty-one) days. For IM use only   Testosterone Cypionate 100 MG/ML SOLN Inject into the muscle.    Testosterone Cypionate 100 MG/ML SOLN Inject into the muscle.   triamcinolone (NASACORT AQ) 55 MCG/ACT AERO nasal inhaler Place 2 sprays into the nose daily as needed (seasonal allergies).    zolpidem (AMBIEN) 10 MG tablet TAKE 1/2 (ONE-HALF) TABLET BY MOUTH AT BEDTIME TAKE  AN  ADDITIONAL  1/2  TABLET  IF  NEEDED  FOR  SLEEP   Current Facility-Administered Medications for the 01/21/19 encounter (Telemedicine) with Lorretta Harp, MD  Medication   omalizumab Arvid Right) injection 300 mg   omalizumab Arvid Right) injection 300 mg   omalizumab Arvid Right) prefilled syringe 75 mg     Allergies:   Codeine, Hydrocodone, Pseudoephedrine, and Azithromycin   Social History   Tobacco Use   Smoking status: Former Smoker    Packs/day: 1.00    Years: 10.00    Pack years: 10.00    Types: Cigarettes, Pipe, Cigars    Start date: 12/02/1949    Quit date: 08/05/1958    Years since quitting: 60.5   Smokeless tobacco: Never Used  Substance Use Topics   Alcohol use: No   Drug use: No     Family Hx: The patient's family history includes Bone cancer in his brother; Colon polyps in his father; Diabetes in his sister; Heart attack in his father; Heart disease in his brother and father; Lung cancer in his mother. There is no history of Lung disease.  ROS:   Please see the history of present illness.     All other systems reviewed and are negative.   Prior CV studies:   The following studies were reviewed today:  Lower extremity arterial Doppler studies performed 01/05/2019  Labs/Other Tests and Data Reviewed:    EKG:  No ECG reviewed.  Recent Labs: 04/21/2018: ALT 22; TSH 1.100 12/29/2018: Hemoglobin 10.3; Platelets 81.0; Pro B Natriuretic peptide (BNP) 2,417.0   Recent Lipid Panel Lab Results  Component Value Date/Time   CHOL 104 (L) 09/18/2015 09:55 AM   TRIG 97 09/18/2015 09:55 AM   HDL 25 (L) 09/18/2015 09:55 AM   CHOLHDL 4.2 09/18/2015 09:55 AM   LDLCALC 60 09/18/2015 09:55 AM     Wt Readings from Last 3 Encounters:  01/21/19 196 lb (88.9 kg)  01/07/19 203 lb 6.4 oz (92.3 kg)  12/29/18 203 lb 12.8 oz (92.4 kg)     Objective:    Vital Signs:  BP (!) 160/73    Pulse 96  Ht 5\' 10"  (1.778 m)    Wt 196 lb (88.9 kg)    BMI 28.12 kg/m    VITAL SIGNS:  reviewed GEN:  no acute distress RESPIRATORY:  normal respiratory effort, symmetric expansion NEURO:  alert and oriented x 3, no obvious focal deficit PSYCH:  normal affect  ASSESSMENT & PLAN:    1. Peripheral arterial disease- history of left lower extremity claudication with recent Dopplers performed 01/05/2019 revealing left ABI 0.64 with an occluded left iliac.  He is on hemodialysis.  He wishes to pursue endovascular revascularization for lifestyle improvement.  I will arrange to do this a week from next Thursday.  COVID-19 Education: The signs and symptoms of COVID-19 were discussed with the patient and how to seek care for testing (follow up with PCP or arrange E-visit).  The importance of social distancing was discussed today.  Time:   Today, I have spent 10 minutes with the patient with telehealth technology discussing the above problems.     Medication Adjustments/Labs and Tests Ordered: Current medicines are reviewed at length with the patient today.  Concerns regarding medicines are outlined above.   Tests Ordered: No orders of the defined types were placed in this encounter.   Medication Changes: No orders of the defined types were placed in this encounter.   Follow Up:  In Person in 1 month(s)  Signed, [name], MD  01/21/2019 10:14 AM    [place]

## 2019-01-21 NOTE — Telephone Encounter (Signed)
Virtual Visit Pre-Appointment Phone Call  "(Name), I am calling you today to discuss your upcoming appointment. We are currently trying to limit exposure to the virus that causes COVID-19 by seeing patients at home rather than in the office."  1. "What is the BEST phone number to call the day of the visit?" - include this in appointment notes  2. Do you have or have access to (through a family member/friend) a smartphone with video capability that we can use for your visit?" a. If yes - list this number in appt notes as cell (if different from BEST phone #) and list the appointment type as a VIDEO visit in appointment notes b. If no - list the appointment type as a PHONE visit in appointment notes  3. Confirm consent - "In the setting of the current Covid19 crisis, you are scheduled for a (phone or video) visit with your provider on (date) at (time).  Just as we do with many in-office visits, in order for you to participate in this visit, we must obtain consent.  If you'd like, I can send this to your mychart (if signed up) or email for you to review.  Otherwise, I can obtain your verbal consent now.  All virtual visits are billed to your insurance company just like a normal visit would be.  By agreeing to a virtual visit, we'd like you to understand that the technology does not allow for your provider to perform an examination, and thus may limit your provider's ability to fully assess your condition. If your provider identifies any concerns that need to be evaluated in person, we will make arrangements to do so.  Finally, though the technology is pretty good, we cannot assure that it will always work on either your or our end, and in the setting of a video visit, we may have to convert it to a phone-only visit.  In either situation, we cannot ensure that we have a secure connection.  Are you willing to proceed?" STAFF: Did the patient verbally acknowledge consent to telehealth visit? Document  YES/NO here: yes  4. Advise patient to be prepared - "Two hours prior to your appointment, go ahead and check your blood pressure, pulse, oxygen saturation, and your weight (if you have the equipment to check those) and write them all down. When your visit starts, your provider will ask you for this information. If you have an Apple Watch or Kardia device, please plan to have heart rate information ready on the day of your appointment. Please have a pen and paper handy nearby the day of the visit as well."  5. Give patient instructions for MyChart download to smartphone OR Doximity/Doxy.me as below if video visit (depending on what platform provider is using)  6. Inform patient they will receive a phone call 15 minutes prior to their appointment time (may be from unknown caller ID) so they should be prepared to answer    TELEPHONE CALL NOTE  QUASHAWN JEWKES has been deemed a candidate for a follow-up tele-health visit to limit community exposure during the Covid-19 pandemic. I spoke with the patient via phone to ensure availability of phone/video source, confirm preferred email & phone number, and discuss instructions and expectations.  I reminded CHEVIS WEISENSEL to be prepared with any vital sign and/or heart rhythm information that could potentially be obtained via home monitoring, at the time of his visit. I reminded VIYAAN CHAMPINE to expect a phone call prior to  his visit.  Oriyah Lamphear Dawna Part, RN 01/21/2019 8:08 AM   INSTRUCTIONS FOR DOWNLOADING THE MYCHART APP TO SMARTPHONE  - The patient must first make sure to have activated MyChart and know their login information - If Apple, go to [employer] and type in MyChart in the search bar and download the app. If Android, ask patient to go to [employer] and type in [place] in the search bar and download the app. The app is free but as with any other app downloads, their phone may require them to verify saved payment information or  Apple/Android password.  - The patient will need to then log into the app with their MyChart username and password, and select [facility] as their healthcare provider to link the account. When it is time for your visit, go to the MyChart app, find appointments, and click Begin Video Visit. Be sure to Select Allow for your device to access the Microphone and Camera for your visit. You will then be connected, and your provider will be with you shortly.  **If they have any issues connecting, or need assistance please contact MyChart service desk [phone])**  **If using a computer, in order to ensure the best quality for their visit they will need to use either of the following Internet Browsers: [employer], or Google Chrome**  IF USING DOXIMITY or DOXY.ME - The patient will receive a link just prior to their visit by text.     FULL LENGTH CONSENT FOR TELE-HEALTH VISIT   I hereby voluntarily request, consent and authorize [facility] and its employed or contracted physicians, physician assistants, nurse practitioners or other licensed health care professionals (the Practitioner), to provide me with telemedicine health care services (the Services") as deemed necessary by the treating Practitioner. I acknowledge and consent to receive the Services by the Practitioner via telemedicine. I understand that the telemedicine visit will involve communicating with the Practitioner through live audiovisual communication technology and the disclosure of certain medical information by electronic transmission. I acknowledge that I have been given the opportunity to request an in-person assessment or other available alternative prior to the telemedicine visit and am voluntarily participating in the telemedicine visit.  I understand that I have the right to withhold or withdraw my consent to the use of telemedicine in the course of my care at any time, without affecting my right to future care  or treatment, and that the Practitioner or I may terminate the telemedicine visit at any time. I understand that I have the right to inspect all information obtained and/or recorded in the course of the telemedicine visit and may receive copies of available information for a reasonable fee.  I understand that some of the potential risks of receiving the Services via telemedicine include:   Delay or interruption in medical evaluation due to technological equipment failure or disruption;  Information transmitted may not be sufficient (e.g. poor resolution of images) to allow for appropriate medical decision making by the Practitioner; and/or   In rare instances, security protocols could fail, causing a breach of personal health information.  Furthermore, I acknowledge that it is my responsibility to provide information about my medical history, conditions and care that is complete and accurate to the best of my ability. I acknowledge that Practitioner's advice, recommendations, and/or decision may be based on factors not within their control, such as incomplete or inaccurate data provided by me or distortions of diagnostic images or specimens that may result from electronic transmissions.  I understand that the practice of medicine is not an exact science and that Practitioner makes no warranties or guarantees regarding treatment outcomes. I acknowledge that I will receive a copy of this consent concurrently upon execution via email to the email address I last provided but may also request a printed copy by calling the office of [facility].    I understand that my insurance will be billed for this visit.   I have read or had this consent read to me.  I understand the contents of this consent, which adequately explains the benefits and risks of the Services being provided via telemedicine.   I have been provided ample opportunity to ask questions regarding this consent and the Services and have had  my questions answered to my satisfaction.  I give my informed consent for the services to be provided through the use of telemedicine in my medical care  By participating in this telemedicine visit I agree to the above.     Spoke with pt about changing his appt to 7/22 on Dr. Gwenlyn Found DOD day because he is a new PV case. Pt states he wants to keep appt today and will accept changing appt to virtual. Pt educated on doximity. Advised pt that he would be contacted by assist to gather info on vitals, meds, allergies, etc before text from Dr. Gwenlyn Found. Pt verbalized understanding

## 2019-01-22 ENCOUNTER — Other Ambulatory Visit: Payer: Self-pay

## 2019-01-22 ENCOUNTER — Telehealth: Payer: Self-pay | Admitting: Cardiovascular Disease

## 2019-01-22 DIAGNOSIS — D631 Anemia in chronic kidney disease: Secondary | ICD-10-CM | POA: Diagnosis not present

## 2019-01-22 DIAGNOSIS — I739 Peripheral vascular disease, unspecified: Secondary | ICD-10-CM

## 2019-01-22 DIAGNOSIS — E1129 Type 2 diabetes mellitus with other diabetic kidney complication: Secondary | ICD-10-CM | POA: Diagnosis not present

## 2019-01-22 DIAGNOSIS — N2581 Secondary hyperparathyroidism of renal origin: Secondary | ICD-10-CM | POA: Diagnosis not present

## 2019-01-22 DIAGNOSIS — D509 Iron deficiency anemia, unspecified: Secondary | ICD-10-CM | POA: Diagnosis not present

## 2019-01-22 DIAGNOSIS — N186 End stage renal disease: Secondary | ICD-10-CM | POA: Diagnosis not present

## 2019-01-22 LAB — RESPIRATORY CULTURE OR RESPIRATORY AND SPUTUM CULTURE
MICRO NUMBER:: 505411
SPECIMEN QUALITY:: ADEQUATE

## 2019-01-22 LAB — FUNGUS CULTURE W SMEAR
MICRO NUMBER:: 505410
SMEAR:: NONE SEEN
SPECIMEN QUALITY:: ADEQUATE

## 2019-01-22 NOTE — Telephone Encounter (Signed)
New Message      Pt is scheduled for a procedure on July 2nd, he is suppose to have his COVID test on the 19th and then he will be quarantined. He has Dialysis on July 1st and is wondering how he is suppose to do this     Please call

## 2019-01-22 NOTE — Telephone Encounter (Signed)
Pt also aware that schedule for inpatient hemodialysis ordered for 7/3 and cath lab has noted this as well. Pt verbalized understanding

## 2019-01-22 NOTE — Telephone Encounter (Signed)
Spoke with pt who states he will not be able to present for COVID-19 test on 6/29 and quarantine himself thereafter d/t scheduled hemodialysis on 7/1. Pt stated he has hemodialysis 8am-12pm. [facility] testing site closes at 12:25pm on 7/1 Nurse ordered rapid COVID-19 test and advised pt to present to [facility] on day of procedure (7/2) at 0400 to allow time for test turnover. Pt verbalized understanding.

## 2019-01-22 NOTE — Progress Notes (Signed)
Spoke with pt who states he will not be able to present for COVID-19 test on 6/29 and quarantine himself thereafter d/t scheduled hemodialysis on 7/1. Pt stated he has hemodialysis 8am-12pm. [facility] testing site closes at 12:25pm on 7/1 Nurse ordered rapid COVID-19 test and advised pt to present to [facility] on day of procedure (7/2) at 0400 to allow time for test turnover. Pt verbalized understanding.

## 2019-01-22 NOTE — Telephone Encounter (Signed)
Routed to primary RN Patient may need to have rapid COVID19 test done day before procedure (scheduled for 7/2) so he can attend dialysis on 7/1

## 2019-01-25 ENCOUNTER — Telehealth: Payer: Self-pay | Admitting: Pulmonary Disease

## 2019-01-25 DIAGNOSIS — E1129 Type 2 diabetes mellitus with other diabetic kidney complication: Secondary | ICD-10-CM | POA: Diagnosis not present

## 2019-01-25 DIAGNOSIS — N2581 Secondary hyperparathyroidism of renal origin: Secondary | ICD-10-CM | POA: Diagnosis not present

## 2019-01-25 DIAGNOSIS — D509 Iron deficiency anemia, unspecified: Secondary | ICD-10-CM | POA: Diagnosis not present

## 2019-01-25 DIAGNOSIS — N186 End stage renal disease: Secondary | ICD-10-CM | POA: Diagnosis not present

## 2019-01-25 DIAGNOSIS — D631 Anemia in chronic kidney disease: Secondary | ICD-10-CM | POA: Diagnosis not present

## 2019-01-25 NOTE — Telephone Encounter (Signed)
Xolair Prefilled Syringe Order: 150mg  Prefilled Syringe:  #4 75mg  Prefilled Syringe: N/A Ordered Date: 01/25/2019 Expected date of arrival: 01/26/2019 Ordered by: Desmond Dike, Joseph: Nigel Mormon

## 2019-01-26 NOTE — Telephone Encounter (Signed)
Xolair Prefilled Syringe Received:  150mg  Prefilled Syringe >> quantity 4, lot # [id number], exp date 10/2019 Medication arrival date: 01/26/2019 Received by: TBS

## 2019-01-27 ENCOUNTER — Other Ambulatory Visit: Payer: Self-pay

## 2019-01-27 DIAGNOSIS — N2581 Secondary hyperparathyroidism of renal origin: Secondary | ICD-10-CM | POA: Diagnosis not present

## 2019-01-27 DIAGNOSIS — E1129 Type 2 diabetes mellitus with other diabetic kidney complication: Secondary | ICD-10-CM | POA: Diagnosis not present

## 2019-01-27 DIAGNOSIS — D631 Anemia in chronic kidney disease: Secondary | ICD-10-CM | POA: Diagnosis not present

## 2019-01-27 DIAGNOSIS — D509 Iron deficiency anemia, unspecified: Secondary | ICD-10-CM | POA: Diagnosis not present

## 2019-01-27 DIAGNOSIS — N186 End stage renal disease: Secondary | ICD-10-CM | POA: Diagnosis not present

## 2019-01-29 DIAGNOSIS — D509 Iron deficiency anemia, unspecified: Secondary | ICD-10-CM | POA: Diagnosis not present

## 2019-01-29 DIAGNOSIS — N186 End stage renal disease: Secondary | ICD-10-CM | POA: Diagnosis not present

## 2019-01-29 DIAGNOSIS — N2581 Secondary hyperparathyroidism of renal origin: Secondary | ICD-10-CM | POA: Diagnosis not present

## 2019-01-29 DIAGNOSIS — D631 Anemia in chronic kidney disease: Secondary | ICD-10-CM | POA: Diagnosis not present

## 2019-01-29 DIAGNOSIS — E1129 Type 2 diabetes mellitus with other diabetic kidney complication: Secondary | ICD-10-CM | POA: Diagnosis not present

## 2019-02-01 ENCOUNTER — Telehealth: Payer: Self-pay

## 2019-02-01 ENCOUNTER — Other Ambulatory Visit: Payer: Self-pay

## 2019-02-01 ENCOUNTER — Other Ambulatory Visit (HOSPITAL_COMMUNITY): Payer: Medicare Other

## 2019-02-01 DIAGNOSIS — N186 End stage renal disease: Secondary | ICD-10-CM | POA: Diagnosis not present

## 2019-02-01 DIAGNOSIS — D631 Anemia in chronic kidney disease: Secondary | ICD-10-CM | POA: Diagnosis not present

## 2019-02-01 DIAGNOSIS — D509 Iron deficiency anemia, unspecified: Secondary | ICD-10-CM | POA: Diagnosis not present

## 2019-02-01 DIAGNOSIS — N2581 Secondary hyperparathyroidism of renal origin: Secondary | ICD-10-CM | POA: Diagnosis not present

## 2019-02-01 DIAGNOSIS — E1129 Type 2 diabetes mellitus with other diabetic kidney complication: Secondary | ICD-10-CM | POA: Diagnosis not present

## 2019-02-01 NOTE — Telephone Encounter (Signed)
Secure chat messages sent today 6/29 b/n nurse and Desiree Lucy, RN regarding pt lab work and COVID test. Nurse called pt while communicating with Desiree Lucy, RN. Per Lank[place], RN, pt unable to get rapid COVID test d/t limited supplies. Pt needed regular COVID test but has scheduling conflicts. Pt stated that he forgot to present for lab work today. He also confirmed that he has an appt with [facility] at 11:30am on 6/30 for an injection and an appt with urology in [place] at 3:30pm on 6/30 as well. Pt regularly has dialysis MWF and will do dialysis 7/1 from 8am-12pm. Lankford, RN needed to confirm if it will be okay for pt to go to urology appt and dialysis appt after COVID test since he is supposed to quarantine afterward. The following secure chat messages confirmed that pt able to do so:   Desiree Lucy, RN: Test site says he can go to urology appt after Covid-MUST wear mask to urology appt and dialysis-should be only places he goes after Covid   Renato Gails, RN: pt aware of the following **6/30: 10:30am labs at [facility], 11:30am injection, 2:30pm COVID, 3:30pm [facility] appt with urology (with mask---pt states he wears mask when outside of home anyway), quarantine at home. **7/1: dialysis 8am-12pm (with mask) then quarantine. **7/2: procedure and will present at 5:30am to [place], RN: Perfect-I confirmed with Minna Merritts at [facility] test site and Seth Bake in Cath Lab    Pt aware of the above info in secure chat for his schedule for 6/30-7/2 and verbalized understanding.

## 2019-02-01 NOTE — Progress Notes (Signed)
Spoke with Shane [name] this afternoon to schedule Covid 19 screening prior to his procedure 02/04/19. He stated due to transportation issues and dialysis He was told he would be screened DOS.

## 2019-02-01 NOTE — Addendum Note (Signed)
Addended by: Annita Brod on: 02/01/2019 04:28 PM   Modules accepted: Orders

## 2019-02-02 ENCOUNTER — Telehealth: Payer: Self-pay

## 2019-02-02 ENCOUNTER — Telehealth: Payer: Self-pay | Admitting: Primary Care

## 2019-02-02 ENCOUNTER — Other Ambulatory Visit (HOSPITAL_COMMUNITY)
Admission: RE | Admit: 2019-02-02 | Discharge: 2019-02-02 | Disposition: A | Discharge status: Home / Self Care | Payer: Medicare Other | Source: Ambulatory Visit | Attending: Cardiovascular Disease | Admitting: Cardiovascular Disease

## 2019-02-02 ENCOUNTER — Other Ambulatory Visit: Payer: Self-pay

## 2019-02-02 ENCOUNTER — Ambulatory Visit (INDEPENDENT_AMBULATORY_CARE_PROVIDER_SITE_OTHER): Payer: Medicare Other

## 2019-02-02 DIAGNOSIS — J4540 Moderate persistent asthma, uncomplicated: Secondary | ICD-10-CM | POA: Diagnosis not present

## 2019-02-02 DIAGNOSIS — I472 Ventricular tachycardia: Secondary | ICD-10-CM | POA: Diagnosis not present

## 2019-02-02 DIAGNOSIS — Z01810 Encounter for preprocedural cardiovascular examination: Secondary | ICD-10-CM | POA: Diagnosis not present

## 2019-02-02 DIAGNOSIS — Z1159 Encounter for screening for other viral diseases: Secondary | ICD-10-CM | POA: Insufficient documentation

## 2019-02-02 DIAGNOSIS — R0602 Shortness of breath: Secondary | ICD-10-CM | POA: Diagnosis not present

## 2019-02-02 DIAGNOSIS — E291 Testicular hypofunction: Secondary | ICD-10-CM | POA: Diagnosis not present

## 2019-02-02 DIAGNOSIS — Z01812 Encounter for preprocedural laboratory examination: Secondary | ICD-10-CM | POA: Diagnosis not present

## 2019-02-02 LAB — CBC

## 2019-02-02 LAB — SARS CORONAVIRUS 2 (TAT 6-24 HRS): SARS Coronavirus 2: NEGATIVE

## 2019-02-02 MED ORDER — OMALIZUMAB 150 MG/ML ~~LOC~~ SOSY
300.0000 mg | PREFILLED_SYRINGE | Freq: Once | SUBCUTANEOUS | Status: AC
  Administered 2019-02-02: 300 mg via SUBCUTANEOUS

## 2019-02-02 NOTE — Telephone Encounter (Signed)
Yes its fine to go ahead and given injection

## 2019-02-02 NOTE — Telephone Encounter (Signed)
Sorry for the short notice, I've been trying to get this note out for 30 minutes. (Pts and ph calls.)  I did the COVID screening with pt, it was neg.Marland Kitchen  His surgeon has him quarantined for surgery. I still wants to get his Xolair shots today, is that ok? Please advise, ASAP.

## 2019-02-02 NOTE — Telephone Encounter (Signed)
cALLED PT TO CONFIRMT HAT HE PRESENTED FOR LAB WORK TODAY. hE STATES HE JUST LEFT THE LAB AND IS CURRENTLY IN THE OFFICE OF ANOTHER PRACTICE FOR INJECTION APPT

## 2019-02-02 NOTE — Telephone Encounter (Signed)
Nothing further needed 

## 2019-02-02 NOTE — Telephone Encounter (Signed)
We wrote our last notes at the same time.  Thanks for answering so quickly. Noted and done.

## 2019-02-02 NOTE — Telephone Encounter (Signed)
I think it is fine if he wears a mask and has limited contact with others. Luz Lex here and right back home. Strict hand hygiene.

## 2019-02-02 NOTE — Progress Notes (Signed)
Have you been hospitalized within the last 10 days?  No Do you have a fever?  No Do you have a cough?  No Do you have a headache or sore throat? No  

## 2019-02-03 ENCOUNTER — Telehealth: Payer: Self-pay

## 2019-02-03 ENCOUNTER — Other Ambulatory Visit: Payer: Self-pay

## 2019-02-03 DIAGNOSIS — I132 Hypertensive heart and chronic kidney disease with heart failure and with stage 5 chronic kidney disease, or end stage renal disease: Secondary | ICD-10-CM | POA: Diagnosis not present

## 2019-02-03 DIAGNOSIS — E1129 Type 2 diabetes mellitus with other diabetic kidney complication: Secondary | ICD-10-CM | POA: Diagnosis not present

## 2019-02-03 DIAGNOSIS — N186 End stage renal disease: Secondary | ICD-10-CM | POA: Diagnosis not present

## 2019-02-03 DIAGNOSIS — D631 Anemia in chronic kidney disease: Secondary | ICD-10-CM | POA: Diagnosis not present

## 2019-02-03 DIAGNOSIS — Z992 Dependence on renal dialysis: Secondary | ICD-10-CM | POA: Diagnosis not present

## 2019-02-03 DIAGNOSIS — I509 Heart failure, unspecified: Secondary | ICD-10-CM | POA: Diagnosis not present

## 2019-02-03 DIAGNOSIS — I7092 Chronic total occlusion of artery of the extremities: Secondary | ICD-10-CM | POA: Diagnosis not present

## 2019-02-03 DIAGNOSIS — I10 Essential (primary) hypertension: Secondary | ICD-10-CM

## 2019-02-03 DIAGNOSIS — N2581 Secondary hyperparathyroidism of renal origin: Secondary | ICD-10-CM | POA: Diagnosis not present

## 2019-02-03 DIAGNOSIS — D509 Iron deficiency anemia, unspecified: Secondary | ICD-10-CM | POA: Diagnosis not present

## 2019-02-03 DIAGNOSIS — E876 Hypokalemia: Secondary | ICD-10-CM | POA: Diagnosis not present

## 2019-02-03 DIAGNOSIS — I70212 Atherosclerosis of native arteries of extremities with intermittent claudication, left leg: Secondary | ICD-10-CM | POA: Diagnosis not present

## 2019-02-03 DIAGNOSIS — I70222 Atherosclerosis of native arteries of extremities with rest pain, left leg: Secondary | ICD-10-CM | POA: Diagnosis not present

## 2019-02-03 DIAGNOSIS — E1151 Type 2 diabetes mellitus with diabetic peripheral angiopathy without gangrene: Secondary | ICD-10-CM | POA: Diagnosis not present

## 2019-02-03 DIAGNOSIS — E1140 Type 2 diabetes mellitus with diabetic neuropathy, unspecified: Secondary | ICD-10-CM | POA: Diagnosis not present

## 2019-02-03 DIAGNOSIS — I129 Hypertensive chronic kidney disease with stage 1 through stage 4 chronic kidney disease, or unspecified chronic kidney disease: Secondary | ICD-10-CM | POA: Diagnosis not present

## 2019-02-03 DIAGNOSIS — Z01812 Encounter for preprocedural laboratory examination: Secondary | ICD-10-CM

## 2019-02-03 DIAGNOSIS — E1122 Type 2 diabetes mellitus with diabetic chronic kidney disease: Secondary | ICD-10-CM | POA: Diagnosis not present

## 2019-02-03 LAB — CBC
Hematocrit: 31.2 % — ABNORMAL LOW (ref 37.5–51.0)
Hemoglobin: 10.3 g/dL — ABNORMAL LOW (ref 13.0–17.7)
MCH: 32.2 pg (ref 26.6–33.0)
MCHC: 33 g/dL (ref 31.5–35.7)
MCV: 98 fL — ABNORMAL HIGH (ref 79–97)
RBC: 3.2 x10E6/uL — ABNORMAL LOW (ref 4.14–5.80)
RDW: 14.1 % (ref 11.6–15.4)
WBC: 3.7 10*3/uL (ref 3.4–10.8)

## 2019-02-03 LAB — BASIC METABOLIC PANEL
BUN/Creatinine Ratio: 7 — ABNORMAL LOW (ref 10–24)
BUN: 30 mg/dL — ABNORMAL HIGH (ref 8–27)
CO2: 28 mmol/L (ref 20–29)
Calcium: 9.4 mg/dL (ref 8.6–10.2)
Chloride: 95 mmol/L — ABNORMAL LOW (ref 96–106)
Creatinine, Ser: 4.35 mg/dL — ABNORMAL HIGH (ref 0.76–1.27)
GFR calc Af Amer: 14 mL/min/{1.73_m2} — ABNORMAL LOW (ref >59)
GFR calc non Af Amer: 12 mL/min/{1.73_m2} — ABNORMAL LOW (ref >59)
Glucose: 70 mg/dL (ref 65–99)
Potassium: 3.4 mmol/L — ABNORMAL LOW (ref 3.5–5.2)
Sodium: 144 mmol/L (ref 134–144)

## 2019-02-03 LAB — TSH: TSH: 1.53 u[IU]/mL (ref 0.450–4.500)

## 2019-02-03 MED ORDER — POTASSIUM CHLORIDE CRYS ER 20 MEQ PO TBCR: 40.0000 meq | EXTENDED_RELEASE_TABLET | Freq: Once | ORAL | 0 refills | Status: DC

## 2019-02-03 NOTE — Telephone Encounter (Signed)
Contacted cath lab and spoke to Jetta/Jedda? Inform her that pt will be having STAT CBC and BMP prior to procedure. She stated she would make note of pt need for labs.   Spoke with pt and informed him that he will need to pick up 40mEq of potassium chloride to take this evening. Pt aware that med is available at his [facility] on [address]. He states he will stop by to pick it up. Also informed pt that he should present to [facility] at 5:30am for his procedure tomorrow (7/2) and that he will be having STAT labs done so be on time. Pt verbalized understanding

## 2019-02-04 ENCOUNTER — Ambulatory Visit (HOSPITAL_COMMUNITY)
Admission: RE | Admit: 2019-02-04 | Discharge: 2019-02-05 | Disposition: A | Discharge status: Home / Self Care | Payer: Medicare Other | Attending: Cardiovascular Disease | Admitting: Cardiovascular Disease

## 2019-02-04 ENCOUNTER — Encounter (HOSPITAL_COMMUNITY): Payer: Self-pay | Admitting: Cardiovascular Disease

## 2019-02-04 ENCOUNTER — Other Ambulatory Visit: Payer: Self-pay

## 2019-02-04 ENCOUNTER — Encounter (HOSPITAL_COMMUNITY)
Admission: RE | Disposition: A | Discharge status: Home / Self Care | Payer: Medicare Other | Source: Home / Self Care | Attending: Cardiovascular Disease

## 2019-02-04 DIAGNOSIS — E7800 Pure hypercholesterolemia, unspecified: Secondary | ICD-10-CM | POA: Diagnosis present

## 2019-02-04 DIAGNOSIS — E785 Hyperlipidemia, unspecified: Secondary | ICD-10-CM | POA: Insufficient documentation

## 2019-02-04 DIAGNOSIS — M1990 Unspecified osteoarthritis, unspecified site: Secondary | ICD-10-CM | POA: Insufficient documentation

## 2019-02-04 DIAGNOSIS — I132 Hypertensive heart and chronic kidney disease with heart failure and with stage 5 chronic kidney disease, or end stage renal disease: Secondary | ICD-10-CM | POA: Diagnosis not present

## 2019-02-04 DIAGNOSIS — E1140 Type 2 diabetes mellitus with diabetic neuropathy, unspecified: Secondary | ICD-10-CM | POA: Insufficient documentation

## 2019-02-04 DIAGNOSIS — E1151 Type 2 diabetes mellitus with diabetic peripheral angiopathy without gangrene: Secondary | ICD-10-CM | POA: Insufficient documentation

## 2019-02-04 DIAGNOSIS — H9190 Unspecified hearing loss, unspecified ear: Secondary | ICD-10-CM | POA: Diagnosis not present

## 2019-02-04 DIAGNOSIS — Z87891 Personal history of nicotine dependence: Secondary | ICD-10-CM | POA: Diagnosis not present

## 2019-02-04 DIAGNOSIS — Z8249 Family history of ischemic heart disease and other diseases of the circulatory system: Secondary | ICD-10-CM | POA: Insufficient documentation

## 2019-02-04 DIAGNOSIS — Z992 Dependence on renal dialysis: Secondary | ICD-10-CM | POA: Insufficient documentation

## 2019-02-04 DIAGNOSIS — G709 Myoneural disorder, unspecified: Secondary | ICD-10-CM | POA: Insufficient documentation

## 2019-02-04 DIAGNOSIS — I10 Essential (primary) hypertension: Secondary | ICD-10-CM | POA: Diagnosis present

## 2019-02-04 DIAGNOSIS — I7092 Chronic total occlusion of artery of the extremities: Secondary | ICD-10-CM | POA: Diagnosis not present

## 2019-02-04 DIAGNOSIS — H409 Unspecified glaucoma: Secondary | ICD-10-CM | POA: Insufficient documentation

## 2019-02-04 DIAGNOSIS — Z7951 Long term (current) use of inhaled steroids: Secondary | ICD-10-CM | POA: Insufficient documentation

## 2019-02-04 DIAGNOSIS — I255 Ischemic cardiomyopathy: Secondary | ICD-10-CM | POA: Diagnosis not present

## 2019-02-04 DIAGNOSIS — J45909 Unspecified asthma, uncomplicated: Secondary | ICD-10-CM | POA: Diagnosis not present

## 2019-02-04 DIAGNOSIS — E1122 Type 2 diabetes mellitus with diabetic chronic kidney disease: Secondary | ICD-10-CM | POA: Diagnosis not present

## 2019-02-04 DIAGNOSIS — I509 Heart failure, unspecified: Secondary | ICD-10-CM | POA: Diagnosis not present

## 2019-02-04 DIAGNOSIS — Z951 Presence of aortocoronary bypass graft: Secondary | ICD-10-CM | POA: Insufficient documentation

## 2019-02-04 DIAGNOSIS — I252 Old myocardial infarction: Secondary | ICD-10-CM | POA: Diagnosis not present

## 2019-02-04 DIAGNOSIS — Z885 Allergy status to narcotic agent status: Secondary | ICD-10-CM | POA: Insufficient documentation

## 2019-02-04 DIAGNOSIS — Z881 Allergy status to other antibiotic agents status: Secondary | ICD-10-CM | POA: Insufficient documentation

## 2019-02-04 DIAGNOSIS — I70212 Atherosclerosis of native arteries of extremities with intermittent claudication, left leg: Secondary | ICD-10-CM | POA: Diagnosis not present

## 2019-02-04 DIAGNOSIS — Z96651 Presence of right artificial knee joint: Secondary | ICD-10-CM | POA: Insufficient documentation

## 2019-02-04 DIAGNOSIS — I70222 Atherosclerosis of native arteries of extremities with rest pain, left leg: Secondary | ICD-10-CM | POA: Diagnosis not present

## 2019-02-04 DIAGNOSIS — Z7982 Long term (current) use of aspirin: Secondary | ICD-10-CM | POA: Diagnosis not present

## 2019-02-04 DIAGNOSIS — I2510 Atherosclerotic heart disease of native coronary artery without angina pectoris: Secondary | ICD-10-CM | POA: Diagnosis not present

## 2019-02-04 DIAGNOSIS — N400 Enlarged prostate without lower urinary tract symptoms: Secondary | ICD-10-CM | POA: Insufficient documentation

## 2019-02-04 DIAGNOSIS — R269 Unspecified abnormalities of gait and mobility: Secondary | ICD-10-CM | POA: Insufficient documentation

## 2019-02-04 DIAGNOSIS — Z7984 Long term (current) use of oral hypoglycemic drugs: Secondary | ICD-10-CM | POA: Diagnosis not present

## 2019-02-04 DIAGNOSIS — Z79899 Other long term (current) drug therapy: Secondary | ICD-10-CM | POA: Insufficient documentation

## 2019-02-04 DIAGNOSIS — N186 End stage renal disease: Secondary | ICD-10-CM

## 2019-02-04 DIAGNOSIS — Z8701 Personal history of pneumonia (recurrent): Secondary | ICD-10-CM | POA: Insufficient documentation

## 2019-02-04 DIAGNOSIS — Z95810 Presence of automatic (implantable) cardiac defibrillator: Secondary | ICD-10-CM | POA: Insufficient documentation

## 2019-02-04 DIAGNOSIS — I739 Peripheral vascular disease, unspecified: Secondary | ICD-10-CM | POA: Diagnosis present

## 2019-02-04 HISTORY — DX: Atherosclerosis of renal artery: I70.1

## 2019-02-04 HISTORY — PX: PERIPHERAL VASCULAR INTERVENTION: CATH118257

## 2019-02-04 HISTORY — DX: End stage renal disease: N18.6

## 2019-02-04 HISTORY — PX: ABDOMINAL AORTOGRAM: CATH118222

## 2019-02-04 HISTORY — DX: Peripheral vascular disease, unspecified: I73.9

## 2019-02-04 HISTORY — PX: LOWER EXTREMITY ANGIOGRAPHY: CATH118251

## 2019-02-04 LAB — GLUCOSE, CAPILLARY
Glucose-Capillary: 112 mg/dL — ABNORMAL HIGH (ref 70–99)
Glucose-Capillary: 98 mg/dL (ref 70–99)
Glucose-Capillary: 108 mg/dL — ABNORMAL HIGH (ref 70–99)
Glucose-Capillary: 114 mg/dL — ABNORMAL HIGH (ref 70–99)

## 2019-02-04 LAB — CBC
HCT: 35.7 % — ABNORMAL LOW (ref 39.0–52.0)
Hemoglobin: 11 g/dL — ABNORMAL LOW (ref 13.0–17.0)
MCH: 32.8 pg (ref 26.0–34.0)
MCHC: 30.8 g/dL (ref 30.0–36.0)
MCV: 106.6 fL — ABNORMAL HIGH (ref 80.0–100.0)
Platelets: 125 10*3/uL — ABNORMAL LOW (ref 150–400)
RBC: 3.35 MIL/uL — ABNORMAL LOW (ref 4.22–5.81)
RDW: 17.1 % — ABNORMAL HIGH (ref 11.5–15.5)
WBC: 4.2 10*3/uL (ref 4.0–10.5)
nRBC: 0 % (ref 0.0–0.2)

## 2019-02-04 LAB — BASIC METABOLIC PANEL
Anion gap: 12 (ref 5–15)
BUN: 26 mg/dL — ABNORMAL HIGH (ref 8–23)
CO2: 29 mmol/L (ref 22–32)
Calcium: 9 mg/dL (ref 8.9–10.3)
Chloride: 98 mmol/L (ref 98–111)
Creatinine, Ser: 3.96 mg/dL — ABNORMAL HIGH (ref 0.61–1.24)
GFR calc Af Amer: 15 mL/min — ABNORMAL LOW (ref >60)
GFR calc non Af Amer: 13 mL/min — ABNORMAL LOW (ref >60)
Glucose, Bld: 114 mg/dL — ABNORMAL HIGH (ref 70–99)
Potassium: 4.9 mmol/L (ref 3.5–5.1)
Sodium: 139 mmol/L (ref 135–145)

## 2019-02-04 LAB — POCT ACTIVATED CLOTTING TIME
Activated Clotting Time: 263 seconds
Activated Clotting Time: 224 seconds
Activated Clotting Time: 180 seconds

## 2019-02-04 SURGERY — ABDOMINAL AORTOGRAM
Anesthesia: LOCAL

## 2019-02-04 MED ORDER — ASPIRIN 81 MG PO CHEW: 81.0000 mg | CHEWABLE_TABLET | ORAL | Status: DC

## 2019-02-04 MED ORDER — SODIUM CHLORIDE 0.9 % IV SOLN: 250.0000 mL | INTRAVENOUS | Status: DC | PRN

## 2019-02-04 MED ORDER — ALBUTEROL SULFATE (2.5 MG/3ML) 0.083% IN NEBU: 2.5000 mg | INHALATION_SOLUTION | Freq: Four times a day (QID) | RESPIRATORY_TRACT | Status: DC | PRN

## 2019-02-04 MED ORDER — SODIUM CHLORIDE 0.9% FLUSH: 3.0000 mL | Freq: Two times a day (BID) | INTRAVENOUS | Status: DC

## 2019-02-04 MED ORDER — ASPIRIN EC 81 MG PO TBEC: 81.0000 mg | DELAYED_RELEASE_TABLET | Freq: Every day | ORAL | Status: DC

## 2019-02-04 MED ORDER — SODIUM CHLORIDE 0.9 % WEIGHT BASED INFUSION: 1.0000 mL/kg/h | INTRAVENOUS | Status: DC

## 2019-02-04 MED ORDER — HEPARIN (PORCINE) IN NACL 1000-0.9 UT/500ML-% IV SOLN
INTRAVENOUS | Status: DC | PRN
  Administered 2019-02-04 (×2): 500 mL

## 2019-02-04 MED ORDER — SODIUM CHLORIDE 0.9 % IV SOLN
INTRAVENOUS | Status: AC
  Administered 2019-02-04: 16:00:00 via INTRAVENOUS

## 2019-02-04 MED ORDER — ZOLPIDEM TARTRATE 5 MG PO TABS
5.0000 mg | ORAL_TABLET | Freq: Every evening | ORAL | Status: DC | PRN
  Administered 2019-02-04: 5 mg via ORAL
  Filled 2019-02-04: qty 1

## 2019-02-04 MED ORDER — IODIXANOL 320 MG/ML IV SOLN
INTRAVENOUS | Status: DC | PRN
  Administered 2019-02-04: 09:00:00 155 mL via INTRAVENOUS

## 2019-02-04 MED ORDER — ALLOPURINOL 100 MG PO TABS
100.0000 mg | ORAL_TABLET | Freq: Every day | ORAL | Status: DC
  Administered 2019-02-04 – 2019-02-05 (×2): 100 mg via ORAL
  Filled 2019-02-04 (×2): qty 1

## 2019-02-04 MED ORDER — SODIUM CHLORIDE 0.9% FLUSH: 3.0000 mL | INTRAVENOUS | Status: DC | PRN

## 2019-02-04 MED ORDER — LIDOCAINE HCL (PF) 1 % IJ SOLN
INTRAMUSCULAR | Status: AC
  Filled 2019-02-04: qty 30

## 2019-02-04 MED ORDER — LABETALOL HCL 5 MG/ML IV SOLN: 10.0000 mg | INTRAVENOUS | Status: DC | PRN

## 2019-02-04 MED ORDER — GLIPIZIDE ER 5 MG PO TB24
5.0000 mg | ORAL_TABLET | Freq: Every day | ORAL | Status: DC
  Administered 2019-02-04: 16:00:00 5 mg via ORAL
  Filled 2019-02-04: qty 1

## 2019-02-04 MED ORDER — ONDANSETRON HCL 4 MG/2ML IJ SOLN: 4.0000 mg | Freq: Four times a day (QID) | INTRAMUSCULAR | Status: DC | PRN

## 2019-02-04 MED ORDER — ATORVASTATIN CALCIUM 80 MG PO TABS
80.0000 mg | ORAL_TABLET | Freq: Every day | ORAL | Status: DC
  Administered 2019-02-04: 80 mg via ORAL
  Filled 2019-02-04: qty 1

## 2019-02-04 MED ORDER — CALCITRIOL 0.25 MCG PO CAPS: 0.2500 ug | ORAL_CAPSULE | ORAL | Status: DC

## 2019-02-04 MED ORDER — SODIUM CHLORIDE 0.9 % WEIGHT BASED INFUSION
3.0000 mL/kg/h | INTRAVENOUS | Status: DC
  Administered 2019-02-04: 3 mL/kg/h via INTRAVENOUS

## 2019-02-04 MED ORDER — ALBUTEROL SULFATE 108 (90 BASE) MCG/ACT IN AEPB: 1.0000 | INHALATION_SPRAY | Freq: Four times a day (QID) | RESPIRATORY_TRACT | Status: DC | PRN

## 2019-02-04 MED ORDER — AMIODARONE HCL 200 MG PO TABS
200.0000 mg | ORAL_TABLET | Freq: Every day | ORAL | Status: DC
  Administered 2019-02-04 – 2019-02-05 (×2): 200 mg via ORAL
  Filled 2019-02-04 (×2): qty 1

## 2019-02-04 MED ORDER — HEPARIN (PORCINE) IN NACL 1000-0.9 UT/500ML-% IV SOLN
INTRAVENOUS | Status: AC
  Filled 2019-02-04: qty 1000

## 2019-02-04 MED ORDER — LIDOCAINE HCL (PF) 1 % IJ SOLN
INTRAMUSCULAR | Status: DC | PRN
  Administered 2019-02-04: 24 mL
  Administered 2019-02-04: 25 mL

## 2019-02-04 MED ORDER — HYDRALAZINE HCL 20 MG/ML IJ SOLN: 5.0000 mg | INTRAMUSCULAR | Status: DC | PRN

## 2019-02-04 MED ORDER — ASPIRIN EC 81 MG PO TBEC
81.0000 mg | DELAYED_RELEASE_TABLET | Freq: Every day | ORAL | Status: DC
  Administered 2019-02-05: 81 mg via ORAL
  Filled 2019-02-04: qty 1

## 2019-02-04 MED ORDER — ATORVASTATIN CALCIUM 10 MG PO TABS: 10.0000 mg | ORAL_TABLET | Freq: Every day | ORAL | Status: DC

## 2019-02-04 MED ORDER — CLOPIDOGREL BISULFATE 75 MG PO TABS
75.0000 mg | ORAL_TABLET | Freq: Every day | ORAL | Status: DC
  Administered 2019-02-05: 75 mg via ORAL
  Filled 2019-02-04: qty 1

## 2019-02-04 MED ORDER — CLOPIDOGREL BISULFATE 300 MG PO TABS
ORAL_TABLET | ORAL | Status: AC
  Filled 2019-02-04: qty 1

## 2019-02-04 MED ORDER — ACETAMINOPHEN 325 MG PO TABS
650.0000 mg | ORAL_TABLET | ORAL | Status: DC | PRN
  Administered 2019-02-04: 650 mg via ORAL
  Filled 2019-02-04: qty 2

## 2019-02-04 MED ORDER — CLOPIDOGREL BISULFATE 300 MG PO TABS
ORAL_TABLET | ORAL | Status: DC | PRN
  Administered 2019-02-04: 300 mg via ORAL

## 2019-02-04 MED ORDER — HEPARIN SODIUM (PORCINE) 1000 UNIT/ML IJ SOLN
INTRAMUSCULAR | Status: DC | PRN
  Administered 2019-02-04: 9000 [IU] via INTRAVENOUS

## 2019-02-04 MED ORDER — SODIUM CHLORIDE 0.9% FLUSH
3.0000 mL | Freq: Two times a day (BID) | INTRAVENOUS | Status: DC
  Administered 2019-02-04 – 2019-02-05 (×3): 3 mL via INTRAVENOUS

## 2019-02-04 MED ORDER — CALCIUM ACETATE (PHOS BINDER) 667 MG PO CAPS
667.0000 mg | ORAL_CAPSULE | Freq: Three times a day (TID) | ORAL | Status: DC
  Administered 2019-02-04 – 2019-02-05 (×2): 667 mg via ORAL
  Filled 2019-02-04 (×2): qty 1

## 2019-02-04 MED ORDER — HEPARIN SODIUM (PORCINE) 1000 UNIT/ML IJ SOLN
INTRAMUSCULAR | Status: AC
  Filled 2019-02-04: qty 1

## 2019-02-04 SURGICAL SUPPLY — 22 items
BALLN MUSTANG 5.0X40 75 (BALLOONS) ×4
BALLOON MUSTANG 5.0X40 75 (BALLOONS) IMPLANT
CATH ANGIO 5F PIGTAIL 65CM (CATHETERS) ×1 IMPLANT
CATH NAVICROSS ANG 65CM (CATHETERS) IMPLANT
CATHETER NAVICROSS ANG 65CM (CATHETERS) ×4
GLIDEWIRE ANGLED SS 035X260CM (WIRE) ×1 IMPLANT
KIT ENCORE 26 ADVANTAGE (KITS) ×1 IMPLANT
KIT PV (KITS) ×4 IMPLANT
SHEATH BRITE TIP 7FR 35CM (SHEATH) ×1 IMPLANT
SHEATH BRITE TIP 8FR 35CM (SHEATH) ×1 IMPLANT
SHEATH PINNACLE 5F 10CM (SHEATH) ×2 IMPLANT
SHEATH PINNACLE 8F 10CM (SHEATH) ×1 IMPLANT
STENT VIABAHNBX 11X29X135 (Permanent Stent) ×1 IMPLANT
STOPCOCK MORSE 400PSI 3WAY (MISCELLANEOUS) ×2 IMPLANT
SYR MEDRAD MARK 7 150ML (SYRINGE) ×4 IMPLANT
TAPE VIPERTRACK RADIOPAQ (MISCELLANEOUS) IMPLANT
TAPE VIPERTRACK RADIOPAQUE (MISCELLANEOUS) ×4
TRANSDUCER W/STOPCOCK (MISCELLANEOUS) ×4 IMPLANT
TRAY PV CATH (CUSTOM PROCEDURE TRAY) ×4 IMPLANT
TUBING CIL FLEX 10 FLL-RA (TUBING) ×1 IMPLANT
WIRE HITORQ VERSACORE ST 145CM (WIRE) ×1 IMPLANT
WIRE VERSACORE LOC 115CM (WIRE) ×1 IMPLANT

## 2019-02-04 NOTE — Progress Notes (Signed)
02/04/2019 1130 Received pt to room 4E-23 from Cath Lab.  Pt is A&O.  L groin site is a little swollen with some bruising, site is marked.  R groin just slight bruising noted. Informed pt he is to remain on bedrest until 1720.  Pt voiced understanding.   Tele monitor applied and CCMD notified.  CHG bath given.  Oriented to room, call light and bed.  Call bell in reach. Carney [place]ers

## 2019-02-04 NOTE — Progress Notes (Addendum)
Site area: Left groin a 7 french arterial sheath ws removed  Site Prior to Removal:  Level 1 Pressure Applied For 20 MINUTES    Bedrest Beginning at 1120am  Manual:   Yes.    Patient Status During Pull:  stable  Post Pull Groin Site:  Level 1 bruise  Post Pull Instructions Given:  Yes.    Post Pull Pulses Present:  Yes.    Dressing Applied:  Yes.    Comments:

## 2019-02-04 NOTE — Interval H&P Note (Signed)
History and Physical Interval Note:  02/04/2019 7:37 AM  Shane [name]  has presented today for surgery, with the diagnosis of pad.  The various methods of treatment have been discussed with the patient and family. After consideration of risks, benefits and other options for treatment, the patient has consented to  Procedure(s): ABDOMINAL AORTOGRAM W/LOWER EXTREMITY (N/A) as a surgical intervention.  The patient's history has been reviewed, patient examined, no change in status, stable for surgery.  I have reviewed the patient's chart and labs.  Questions were answered to the patient's satisfaction.     [name]

## 2019-02-04 NOTE — Progress Notes (Addendum)
Site area: Right groin a 5 french arterial sheath was removed  Site Prior to Removal:  Level 1 Pressure Applied For 20 MINUTES    Bedrest Beginning at 1120am  Manual:   Yes.    Patient Status During Pull:  stable  Post Pull Groin Site:  Level 1 bruise  Post Pull Instructions Given:  Yes.    Post Pull Pulses Present:  Yes.    Dressing Applied:  Yes.    Comments:

## 2019-02-05 ENCOUNTER — Encounter (HOSPITAL_COMMUNITY): Payer: Self-pay | Admitting: Physician Assistant

## 2019-02-05 ENCOUNTER — Other Ambulatory Visit: Payer: Self-pay | Admitting: Physician Assistant

## 2019-02-05 DIAGNOSIS — N186 End stage renal disease: Secondary | ICD-10-CM

## 2019-02-05 DIAGNOSIS — E1122 Type 2 diabetes mellitus with diabetic chronic kidney disease: Secondary | ICD-10-CM | POA: Diagnosis not present

## 2019-02-05 DIAGNOSIS — D509 Iron deficiency anemia, unspecified: Secondary | ICD-10-CM | POA: Diagnosis not present

## 2019-02-05 DIAGNOSIS — I739 Peripheral vascular disease, unspecified: Secondary | ICD-10-CM

## 2019-02-05 DIAGNOSIS — E1129 Type 2 diabetes mellitus with other diabetic kidney complication: Secondary | ICD-10-CM | POA: Diagnosis not present

## 2019-02-05 DIAGNOSIS — E1140 Type 2 diabetes mellitus with diabetic neuropathy, unspecified: Secondary | ICD-10-CM | POA: Diagnosis not present

## 2019-02-05 DIAGNOSIS — E1151 Type 2 diabetes mellitus with diabetic peripheral angiopathy without gangrene: Secondary | ICD-10-CM | POA: Diagnosis not present

## 2019-02-05 DIAGNOSIS — I509 Heart failure, unspecified: Secondary | ICD-10-CM | POA: Diagnosis not present

## 2019-02-05 DIAGNOSIS — D631 Anemia in chronic kidney disease: Secondary | ICD-10-CM | POA: Diagnosis not present

## 2019-02-05 DIAGNOSIS — I132 Hypertensive heart and chronic kidney disease with heart failure and with stage 5 chronic kidney disease, or end stage renal disease: Secondary | ICD-10-CM | POA: Diagnosis not present

## 2019-02-05 DIAGNOSIS — E876 Hypokalemia: Secondary | ICD-10-CM | POA: Diagnosis not present

## 2019-02-05 DIAGNOSIS — N2581 Secondary hyperparathyroidism of renal origin: Secondary | ICD-10-CM | POA: Diagnosis not present

## 2019-02-05 DIAGNOSIS — I70222 Atherosclerosis of native arteries of extremities with rest pain, left leg: Secondary | ICD-10-CM | POA: Diagnosis not present

## 2019-02-05 LAB — GLUCOSE, CAPILLARY: Glucose-Capillary: 94 mg/dL (ref 70–99)

## 2019-02-05 MED ORDER — ATORVASTATIN CALCIUM 80 MG PO TABS: 80.0000 mg | ORAL_TABLET | Freq: Every evening | ORAL | 6 refills | Status: DC

## 2019-02-05 MED ORDER — CHLORHEXIDINE GLUCONATE CLOTH 2 % EX PADS
6.0000 | MEDICATED_PAD | Freq: Every day | CUTANEOUS | Status: DC
  Administered 2019-02-05: 09:00:00 6 via TOPICAL

## 2019-02-05 MED ORDER — CLOPIDOGREL BISULFATE 75 MG PO TABS: 75.0000 mg | ORAL_TABLET | Freq: Every day | ORAL | 3 refills | Status: AC

## 2019-02-05 NOTE — Progress Notes (Signed)
Progress Note  Patient Name: Shane [name] Date of Encounter: 02/05/2019  Primary Cardiologist: Mertie Moores, MD  Peripheral vascular cardiologist: Dr. [name]  Subjective   Postop day 1 iliac stent.  He ambulated yesterday.  He has no pain.  Inpatient Medications    Scheduled Meds: . allopurinol  100 mg Oral Daily  . amiodarone  200 mg Oral Daily  . aspirin EC  81 mg Oral Daily  . atorvastatin  80 mg Oral q1800  . calcitRIOL  0.25 mcg Oral Q M,W,F-HD  . calcium acetate  667 mg Oral TID WC  . clopidogrel  75 mg Oral Q breakfast  . glipiZIDE  5 mg Oral Q supper  . sodium chloride flush  3 mL Intravenous Q12H   Continuous Infusions: . sodium chloride     PRN Meds: sodium chloride, acetaminophen, albuterol, hydrALAZINE, labetalol, ondansetron (ZOFRAN) IV, sodium chloride flush, zolpidem   Vital Signs    Vitals:   02/04/19 1147 02/04/19 2021 02/05/19 0553 02/05/19 0745  BP: 130/76 129/68 128/66 (!) 146/76  Pulse: 83 83 81 87  Resp:   18 18  Temp: 98.1 F (36.7 C) 98.2 F (36.8 C) 97.8 F (36.6 C) 97.6 F (36.4 C)  TempSrc: Oral Oral Oral Oral  SpO2: 95% 96% 97% 100%  Weight:      Height:        Intake/Output Summary (Last 24 hours) at 02/05/2019 0801 Last data filed at 02/05/2019 0757 Gross per 24 hour  Intake 243 ml  Output -  Net 243 ml   Last 3 Weights 02/04/2019 01/21/2019 01/07/2019  Weight (lbs) 196 lb 196 lb 203 lb 6.4 oz  Weight (kg) 88.905 kg 88.905 kg 92.262 kg      Telemetry    Sinus rhythm- Personally Reviewed  ECG    Not performed today- Personally Reviewed  Physical Exam   GEN: No acute distress.   Neck: No JVD Cardiac: RRR, no murmurs, rubs, or gallops.  Respiratory: Clear to auscultation bilaterally. GI: Soft, nontender, non-distended  MS: No edema; No deformity. Neuro:  Nonfocal  Psych: Normal affect  Extremities: Ecchymosis bilateral groins without hematoma  Labs    High Sensitivity Troponin:  No results for  input(s): TROPONINIHS in the last 720 hours.    Cardiac EnzymesNo results for input(s): TROPONINI in the last 168 hours. No results for input(s): TROPIPOC in the last 168 hours.   Chemistry Recent Labs  Lab 02/02/19 1037 02/04/19 0638  NA 144 139  K 3.4* 4.9  CL 95* 98  CO2 28 29  GLUCOSE 70 114*  BUN 30* 26*  CREATININE 4.35* 3.96*  CALCIUM 9.4 9.0  GFRNONAA 12* 13*  GFRAA 14* 15*  ANIONGAP  --  12     Hematology Recent Labs  Lab 02/02/19 1037 02/04/19 0638  WBC 3.7 4.2  RBC 3.20* 3.35*  HGB 10.3* 11.0*  HCT 31.2* 35.7*  MCV 98* 106.6*  MCH 32.2 32.8  MCHC 33.0 30.8  RDW 14.1 17.1*  PLT CANCELED 125*    BNPNo results for input(s): BNP, PROBNP in the last 168 hours.   DDimer No results for input(s): DDIMER in the last 168 hours.   Radiology    No results found.  Cardiac Studies   None  Patient Profile     Shane [name] is a 85 y.o. married Caucasian male father of 2 children, grandfather 2 grandchildren his brother Herbie Baltimore is also a patient of mine.  He is accompanied by  his wife today.  He was referred by Dr. Adah Perl, his podiatrist and Dr. Acie Fredrickson, his cardiologist for peripheral vascular valuation because of claudication.  His risk factors include history of hypertension, hyperlipidemia and type 2 diabetes.  He has a history of ischemic heart disease status post remote CABG by Dr. Merleen Nicely in 1986 with ischemic cardiomyopathy status post ICD implantation by Dr. Caryl Comes.  He has chronic renal insufficiency and has been on hemodialysis since April of last year currently followed by Dr. Jimmy Footman.  He has had left lower extremity discomfort/claudication for several months with recent Dopplers performed 01/05/2019 revealing a left ABI 0.64 with what appears to be an occluded left common iliac artery.    He has lifestyle limiting claudication.  He presented yesterday for angiography and endovascular therapy.   Assessment & Plan    1: Peripheral arterial  disease- history of several months of left lower extremity discomfort consistent with claudication with Dopplers that showed a left ABI 0.6 for what appeared to be an occluded left common iliac artery.  I performed angiography confirming this with a ostial left common iliac artery CTO.  I was able to cross this successfully and stented this with a 11 mm x 29 mm covered stent with an excellent result.  He ambulated in the hallway yesterday afternoon.  He has ecchymosis in both groins but no hematoma.  Plan will be for discharge home this afternoon after he has hemodialysis.  We will arrange lower extremity arterial Doppler studies [facility] office next week and I will see him back 2 to 3 weeks thereafter.  For questions or updates, please contact [facility] Please consult [email or web address] for contact info under        Signed, [name], MD  02/05/2019, 8:01 AM

## 2019-02-05 NOTE — Progress Notes (Signed)
02/05/2019 10:34 AM Discharge AVS meds taken today and those due this evening reviewed.  Follow-up appointments and when to call md reviewed.  D/C IV and TELE.  Questions and concerns addressed.   D/C home per orders. [place]ney [place]ers

## 2019-02-05 NOTE — Discharge Summary (Addendum)
Discharge Summary    Patient ID: Shane [name],  MRN: [id number], DOB/AGE: 12/15/1933 85 y.o.  Admit date: 02/04/2019 Discharge date: 02/05/2019  Primary Care Provider: [name] Primary Cardiologist: Mertie Moores, MD Primary Electrophysiologist:  None  Discharge Diagnoses    Principal Problem:   Claudication in peripheral vascular disease [facility]) Active Problems:   Hypercholesterolemia   Essential hypertension   Peripheral arterial disease ([place])   ESRD (end stage renal disease) ([place])   Diagnostic Studies/Procedures    PV Angiogram/Intervention  History obtained from chart review.Shane [name] a 85 y.o.married Caucasian male father of 2 children, grandfather 2 grandchildren his brother Shane [name] is also a patient of mine. He is accompanied by his wife today. He was referred by Dr. Adah Perl, his podiatrist and Dr. Acie Fredrickson, his cardiologist for peripheral vascular valuation because of claudication. His risk factors include history of hypertension, hyperlipidemia and type 2 diabetes. He has a history of ischemic heart disease status post remote CABG by Dr. Merleen Nicely in 1986 with ischemic cardiomyopathy status post ICD implantation by Dr. Caryl Comes. He has chronic renal insufficiency and has been on hemodialysis since April of last year currently followed by Dr. Jimmy Footman.He has had left lower extremity discomfort/claudication for several months with recent Dopplers performed 01/05/2019 revealing a left ABI 0.64 with what appears to be an occluded left common iliac artery. This is lifestyle limiting.  He presents this morning for angiography and potential endovascular therapy for lifestyle limiting claudication.  Pre Procedure Diagnosis: Peripheral arterial disease  Post Procedure Diagnosis: Peripheral arterial disease  Operators: Dr. [name]  Procedures Performed:               1.  Abdominal aortogram/bilateral iliac angiogram               2.  PTA and  covered stenting left common iliac artery CTO  PROCEDURE DESCRIPTION:   The patient was brought to the second floor [facility] lab in the the postabsorptive state. He was not premedicated . His right and left groins were prepped and shaved in usual sterile fashion. Xylocaine 1% was used for local anesthesia. A 5 French sheath was inserted into the right common femoral artery using standard Seldinger technique.  A 5 French pigtail catheter was placed the distal abdominal aorta.  Distal abdominal aortography, bilateral iliac angiography performed using Visipaque dye.  Retrograde aortic pressure was monitored during the case.   Angiographic Data:   1: Abdominal aorta- moderate bilateral ostial renal artery stenosis, mild dilatation atherosclerotic changes of the infrarenal abdominal aorta 2: Left lower extremity- short segment CTO ostial/proximal left common iliac artery with poststenotic dilatation 3: Right lower extremity- widely patent  IMPRESSION: Shane [name] has an occluded ostial/proximal left common iliac artery.  We will proceed with PTA and covered stenting  Procedure Description: An 8 French bright tip sheath was inserted into the left common femoral artery.  Patient received 9 fascias of heparin with an ACT of 224 at the end of the case.  A total of 155 cc of contrast was administered to the patient.  I was able to cross the CTO with a 035 angled N[place]ross  braided endhole catheter along with a 035 stiff angled Glidewire.  I then exchanged for an 035 versa core wire and performed PTA of the left common iliac artery occlusion with a 5 mm x 4 cm balloon.  I then performed covered stenting with a 11 mm x 29 mm long VBX covered  stent deployed at 10 atm resulting reduction of a total occlusion to 0% residual.  Patient tolerated procedure well.  There was a small hematoma in the left groin at the end of the case which was soft and stable.  The patient did receive 300 mg of p.o.  Plavix.  Both sheaths were sewn securely in place.  Final Impression: Successful PTA and covered stenting of a left common iliac artery CTO with a left a millimeter VBX covered stent.  The sheath will be removed and pressure held once the ACT falls below 170.  He will be treated with aspirin and Plavix.  He will need hemodialysis tomorrow morning prior to discharge.  We will obtain lower extremity arterial Doppler studies in our [facility] line office next week and I will see him back 1 to 2 weeks thereafter.  He left the lab in stable condition.   _____________     History of Present Illness     Shane [name] is a 85 y.o. male with history of CAD s/p CABG 1986, ICM s/p ICD, DM, HTN, HLD, ESRD on HD MWF, spinal stenosis, neuropathy, hard of hearing, glaucoma who was admitted for planned PV angio. He has had left lower extremity discomfort/claudication for several months with recent Dopplers performed 01/05/2019 revealing a left ABI 0.64 with what appears to be an occluded left common iliac artery. This is lifestyle limiting.   Hospital Course    He was brought in for this procedure yesterday with results noted above. He underwent successful PTA/stenting of the left common iliac artery CTO. He tolerated this procedure well. He ambulated in the hallway. He has ecchymosis in both groins but no hematoma. Dr. Gwenlyn Found has recommended LE aortoiliac studies next week and f/u with him in 2-3 weeks. Per discussion with nephrology, they feel it would actually be better for him to dialyze this afternoon at his usual center at 1pm than wait until later as inpatient. I sent message to the office to arrange f/u aortoiliac studies in 1 week and f/u in 2-3 weeks per Dr. Gwenlyn Found. Dr. Gwenlyn Found increased his statin from 39m to 847m If the patient is tolerating statin at time of follow-up appointment, would consider rechecking liver function/lipid panel in 6-8 weeks. DC instructions outlined below, as on the AVS as  well. _____________  Discharge Vitals Blood pressure (!) 146/76, pulse 87, temperature 97.6 F (36.4 C), temperature source Oral, resp. rate 18, height '5\' 9"'  (1.753 m), weight 88.9 kg, SpO2 100 %.  Filed Weights   02/04/19 0548  Weight: 88.9 kg    Labs & Radiologic Studies    CBC Recent Labs    02/02/19 1037 02/04/19 0638  WBC 3.7 4.2  HGB 10.3* 11.0*  HCT 31.2* 35.7*  MCV 98* 106.6*  PLT CANCELED 12409  Basic Metabolic Panel Recent Labs    02/02/19 1037 02/04/19 0638  NA 144 139  K 3.4* 4.9  CL 95* 98  CO2 28 29  GLUCOSE 70 114*  BUN 30* 26*  CREATININE 4.35* 3.96*  CALCIUM 9.4 9.0  Thyroid Function Tests Recent Labs    02/02/19 1037  TSH 1.530   _____________  Dg Chest 2 View  Result Date: 01/09/2019 CLINICAL DATA:  [age]ear old male with history of shortness of breath for over a month. Productive cough. EXAM: CHEST - 2 VIEW COMPARISON:  Chest x-ray 12/29/2018. FINDINGS: Lung volumes are normal. No consolidative airspace disease. No pleural effusions. No pneumothorax. No pulmonary nodule or mass noted. Pulmonary vasculature  and the cardiomediastinal silhouette are within normal limits. Aortic atherosclerosis. Status post median sternotomy. Left-sided biventricular pacemaker/AICD with lead tips projecting over the expected location of the right atrium, right ventricular apex and overlying the lateral wall the left ventricle via the coronary sinus and coronary veins. IMPRESSION: 1. No radiographic evidence of acute cardiopulmonary disease. 2. Aortic atherosclerosis. Electronically Signed   By: Vinnie Langton M.D.   On: 01/09/2019 23:36   Nm Pulmonary Perfusion  Result Date: 01/08/2019 CLINICAL DATA:  Short of breath EXAM: NUCLEAR MEDICINE PERFUSION LUNG SCAN TECHNIQUE: Perfusion images were obtained in multiple projections after intravenous injection of radiopharmaceutical. Ventilation scans intentionally deferred if perfusion scan and chest x-ray adequate for  interpretation during COVID 19 epidemic. RADIOPHARMACEUTICALS:  1.4 mCi Tc-13mMAA IV COMPARISON:  01/08/2019 . FINDINGS: On the chest radiograph from today patchy airspace densities noted in both lungs. No suspicious segmental perfusion defects identified to suggest acute pulmonary embolus. IMPRESSION: 1. No findings to suspect acute pulmonary embolus. Electronically Signed   By: TKerby MoorsM.D.   On: 01/08/2019 15:40   Disposition   Pt is being discharged home today in good condition.  Follow-up Plans & Appointments    Follow-up Information    BLorretta Harp MD Follow up.   Specialties: Cardiology, Radiology Why: Dr. BKennon Holteroffice will call you to arrange your follow-up testing and appointments. Please call the office if you have not heard back within 2-3 days. Contact information: [phone]         Discharge Instructions    Diet - low sodium heart healthy   Complete by: As directed    Discharge instructions   Complete by: As directed    You have been started on a new blood thinner called Plavix/clopidogrel to take in addition to your aspirin. If you notice any bleeding such as blood in stool, black tarry stools, blood in urine, nosebleeds or any other unusual bleeding, call your doctor immediately. It is not normal to have this kind of bleeding while on a blood thinner and usually indicates there is an underlying problem with one of your body systems that needs to be checked out.   Dr. BGwenlyn Foundhas increased your atorvastatin - this is a new prescription.  Your Mucinex cold/flu has been removed from your med list because it has a decongestant in it. Patients with heart disease should avoid stimulant-type medicines. This includes medicines like Adderall or even cold/allergy medicines that contain pseudoephedrine or phenylephrine. (Sometimes on the bottle it will say "-D" to indicate the decongestant, which you'll need to  avoid.) Please make sure to pay attention to labels.  Potassium supplement was removed from your medicine list since this was a one-time medicine.   Increase activity slowly   Complete by: As directed    No driving for 2 days (unless instructed not to drive indefinitely for other reasons). No lifting over 5 lbs for 1 week. No sexual activity for 1 week. Keep procedure site clean & dry. If you notice increased pain, swelling, bleeding or pus, call/return!  You may shower, but no soaking baths/hot tubs/pools for 1 week.      Discharge Medications   Allergies as of 02/05/2019      Reactions   Codeine Other (See Comments)   anxiety   Hydrocodone Other (See Comments)   Anxiety- can take in liquid form   Pseudoephedrine Other (See Comments)   Causes heart to race  Azithromycin Rash      Medication List    STOP taking these medications   Mucinex Fast-Max Cold Flu 5-10-200-325 MG/10ML Liqd Generic drug: Phenylephrine-DM-GG-APAP   potassium chloride SA 20 MEQ tablet Commonly known as: K-DUR     TAKE these medications   Albuterol Sulfate 108 (90 Base) MCG/ACT Aepb Commonly known as: ProAir RespiClick Inhale 1-2 puffs into the lungs every 6 (six) hours as needed. What changed: reasons to take this   albuterol (2.5 MG/3ML) 0.083% nebulizer solution Commonly known as: PROVENTIL Take 3 mLs (2.5 mg total) by nebulization every 6 (six) hours as needed for wheezing or shortness of breath. What changed: Another medication with the same name was changed. Make sure you understand how and when to take each.   allopurinol 100 MG tablet Commonly known as: ZYLOPRIM Take 1 tablet (100 mg total) by mouth daily.   amiodarone 200 MG tablet Commonly known as: PACERONE Take 1 tablet (200 mg total) by mouth daily.   aspirin EC 81 MG tablet Take 81 mg by mouth daily.   atorvastatin 80 MG tablet Commonly known as: LIPITOR Take 1 tablet (80 mg total) by mouth every evening. What changed:    medication strength  how much to take  when to take this   calcitRIOL 0.25 MCG capsule Commonly known as: ROCALTROL Take 1 capsule (0.25 mcg total) by mouth every Monday, Wednesday, and Friday with hemodialysis.   calcium acetate 667 MG capsule Commonly known as: PHOSLO Take 667 mg by mouth 3 (three) times daily with meals.   cetirizine 10 MG tablet Commonly known as: ZYRTEC Take 10 mg by mouth at bedtime as needed (seasonal allergies).   clopidogrel 75 MG tablet Commonly known as: PLAVIX Take 1 tablet (75 mg total) by mouth daily.   clotrimazole-betamethasone lotion Commonly known as: LOTRISONE Apply to left foot twice daily   dicyclomine 10 MG capsule Commonly known as: BENTYL Take 10 mg by mouth 3 (three) times daily as needed for spasms.   fluticasone furoate-vilanterol 200-25 MCG/INH Aepb Commonly known as: Breo Ellipta Inhale 1 puff into the lungs daily.   glipiZIDE 5 MG 24 hr tablet Commonly known as: GLUCOTROL XL Take 5 mg by mouth every evening.   multivitamin Tabs tablet Take 1 tablet by mouth at bedtime.   Nasacort AQ 55 MCG/ACT Aero nasal inhaler Generic drug: triamcinolone Place 2 sprays into the nose daily as needed (seasonal allergies).   OXYGEN Inhale 2 L into the lungs See admin instructions. Inhale 2 L into lungs during dialysis on Monday, Wednesday and Friday and every night at bedtime   polyethylene glycol 17 g packet Commonly known as: MIRALAX / GLYCOLAX Take 17 g by mouth daily as needed for mild constipation.   SYRINGE 3CC/22GX1" 22G X 1" 3 ML Misc Use to give injection   tadalafil 20 MG tablet Commonly known as: CIALIS Take 20 mg by mouth daily as needed for erectile dysfunction. Do not exceed 2 doses in 7 days   testosterone cypionate 100 MG/ML injection Commonly known as: DEPOTESTOTERONE CYPIONATE Inject 400 mg into the muscle every 21 ( twenty-one) days. For IM use only   XOLAIR S[place]ite Inject into the skin every 21 (  twenty-one) days.   zolpidem 10 MG tablet Commonly known as: AMBIEN TAKE 1/2 (ONE-HALF) TABLET BY MOUTH AT BEDTIME TAKE  AN  ADDITIONAL  1/2  TABLET  IF  NEEDED  FOR  SLEEP What changed: See the new instructions.  Allergies:  Allergies  Allergen Reactions   Codeine Other (See Comments)    anxiety   Hydrocodone Other (See Comments)    Anxiety- can take in liquid form   Pseudoephedrine Other (See Comments)    Causes heart to race   Azithromycin Rash   Outstanding Labs/Studies   If the patient is tolerating statin at time of follow-up appointment, would consider rechecking liver function/lipid panel in 6-8 weeks.   Duration of Discharge Encounter   Greater than 30 minutes including physician time.  Signed, Nedra Hai Dunn PA-C 02/05/2019, 9:15 AM  Agree with findings by Melina Copa PA-C  Shane [name] was stable for discharge postop day 1 left common iliac artery CTO PTA and covered stenting.  He has ecchymosis in both groins but no hematoma.  He is otherwise stable and is ambulated in the hallway without difficulty.  He will undergo hemodialysis at his routine dialysis center this afternoon and will follow-up in several weeks in my office after having obtained aortoiliac Dopplers in our [employer] office next week.  Lorretta Harp, M.D., [place], [facility], Laverta [name] [place] [address] [place], [place]  [id number]  [phone] 02/07/2019 1:35 PM

## 2019-02-08 DIAGNOSIS — N2581 Secondary hyperparathyroidism of renal origin: Secondary | ICD-10-CM | POA: Diagnosis not present

## 2019-02-08 DIAGNOSIS — N186 End stage renal disease: Secondary | ICD-10-CM | POA: Diagnosis not present

## 2019-02-08 DIAGNOSIS — D509 Iron deficiency anemia, unspecified: Secondary | ICD-10-CM | POA: Diagnosis not present

## 2019-02-08 DIAGNOSIS — E1129 Type 2 diabetes mellitus with other diabetic kidney complication: Secondary | ICD-10-CM | POA: Diagnosis not present

## 2019-02-08 DIAGNOSIS — D631 Anemia in chronic kidney disease: Secondary | ICD-10-CM | POA: Diagnosis not present

## 2019-02-08 DIAGNOSIS — E876 Hypokalemia: Secondary | ICD-10-CM | POA: Diagnosis not present

## 2019-02-09 ENCOUNTER — Other Ambulatory Visit: Payer: Self-pay | Admitting: Internal Medicine

## 2019-02-09 DIAGNOSIS — F5101 Primary insomnia: Secondary | ICD-10-CM

## 2019-02-09 NOTE — Telephone Encounter (Signed)
Check [place] registry last filled 01/12/2019.Marland KitchenJohny [name]

## 2019-02-10 ENCOUNTER — Telehealth: Payer: Self-pay | Admitting: Cardiovascular Disease

## 2019-02-10 DIAGNOSIS — D631 Anemia in chronic kidney disease: Secondary | ICD-10-CM | POA: Diagnosis not present

## 2019-02-10 DIAGNOSIS — E876 Hypokalemia: Secondary | ICD-10-CM | POA: Diagnosis not present

## 2019-02-10 DIAGNOSIS — N186 End stage renal disease: Secondary | ICD-10-CM | POA: Diagnosis not present

## 2019-02-10 DIAGNOSIS — D509 Iron deficiency anemia, unspecified: Secondary | ICD-10-CM | POA: Diagnosis not present

## 2019-02-10 DIAGNOSIS — E1129 Type 2 diabetes mellitus with other diabetic kidney complication: Secondary | ICD-10-CM | POA: Diagnosis not present

## 2019-02-10 DIAGNOSIS — N2581 Secondary hyperparathyroidism of renal origin: Secondary | ICD-10-CM | POA: Diagnosis not present

## 2019-02-10 NOTE — Telephone Encounter (Signed)
New Message   Patient is calling to see when he will be able to remove his bandages. Please call.

## 2019-02-10 NOTE — Telephone Encounter (Signed)
Can be removed now

## 2019-02-10 NOTE — Telephone Encounter (Signed)
Pt states he was not instructed during discharge on when dressing to groin should be removed. Please advise  Pt had PV procedure with Dr. Gwenlyn Found on 7/2

## 2019-02-10 NOTE — Telephone Encounter (Signed)
Pt aware he may remove dressing now per Dr. Gwenlyn Found. Verbalized understanding

## 2019-02-12 DIAGNOSIS — E876 Hypokalemia: Secondary | ICD-10-CM | POA: Diagnosis not present

## 2019-02-12 DIAGNOSIS — D631 Anemia in chronic kidney disease: Secondary | ICD-10-CM | POA: Diagnosis not present

## 2019-02-12 DIAGNOSIS — D509 Iron deficiency anemia, unspecified: Secondary | ICD-10-CM | POA: Diagnosis not present

## 2019-02-12 DIAGNOSIS — N186 End stage renal disease: Secondary | ICD-10-CM | POA: Diagnosis not present

## 2019-02-12 DIAGNOSIS — E1129 Type 2 diabetes mellitus with other diabetic kidney complication: Secondary | ICD-10-CM | POA: Diagnosis not present

## 2019-02-12 DIAGNOSIS — N2581 Secondary hyperparathyroidism of renal origin: Secondary | ICD-10-CM | POA: Diagnosis not present

## 2019-02-15 DIAGNOSIS — N186 End stage renal disease: Secondary | ICD-10-CM | POA: Diagnosis not present

## 2019-02-15 DIAGNOSIS — D509 Iron deficiency anemia, unspecified: Secondary | ICD-10-CM | POA: Diagnosis not present

## 2019-02-15 DIAGNOSIS — E876 Hypokalemia: Secondary | ICD-10-CM | POA: Diagnosis not present

## 2019-02-15 DIAGNOSIS — E1129 Type 2 diabetes mellitus with other diabetic kidney complication: Secondary | ICD-10-CM | POA: Diagnosis not present

## 2019-02-15 DIAGNOSIS — N2581 Secondary hyperparathyroidism of renal origin: Secondary | ICD-10-CM | POA: Diagnosis not present

## 2019-02-15 DIAGNOSIS — D631 Anemia in chronic kidney disease: Secondary | ICD-10-CM | POA: Diagnosis not present

## 2019-02-16 ENCOUNTER — Other Ambulatory Visit: Payer: Self-pay | Admitting: Internal Medicine

## 2019-02-17 ENCOUNTER — Telehealth: Payer: Self-pay | Admitting: Cardiovascular Disease

## 2019-02-17 DIAGNOSIS — E1129 Type 2 diabetes mellitus with other diabetic kidney complication: Secondary | ICD-10-CM | POA: Diagnosis not present

## 2019-02-17 DIAGNOSIS — D631 Anemia in chronic kidney disease: Secondary | ICD-10-CM | POA: Diagnosis not present

## 2019-02-17 DIAGNOSIS — D509 Iron deficiency anemia, unspecified: Secondary | ICD-10-CM | POA: Diagnosis not present

## 2019-02-17 DIAGNOSIS — E876 Hypokalemia: Secondary | ICD-10-CM | POA: Diagnosis not present

## 2019-02-17 DIAGNOSIS — N2581 Secondary hyperparathyroidism of renal origin: Secondary | ICD-10-CM | POA: Diagnosis not present

## 2019-02-17 DIAGNOSIS — N186 End stage renal disease: Secondary | ICD-10-CM | POA: Diagnosis not present

## 2019-02-17 MED ORDER — ATORVASTATIN CALCIUM 80 MG PO TABS: 80.0000 mg | ORAL_TABLET | Freq: Every evening | ORAL | 0 refills | Status: DC

## 2019-02-17 NOTE — Telephone Encounter (Signed)
Yes please refill atorvastatin . Even if I have not seen him this year, he has been seen by several  partners and it is fine to refill normal routine cardiac meds.

## 2019-02-17 NOTE — Addendum Note (Signed)
Addended by: [name] on: 02/17/2019 03:01 PM   Modules accepted: Orders

## 2019-02-17 NOTE — Telephone Encounter (Signed)
[facility] mail order pharmacy is requesting a refill on Atorvastatin. Would Dr. Acie Fredrickson like to refill this medication? Please address

## 2019-02-17 NOTE — Telephone Encounter (Signed)
Pt's medication was sent to pt's pharmacy as requested. Confirmation received.  °

## 2019-02-19 DIAGNOSIS — D631 Anemia in chronic kidney disease: Secondary | ICD-10-CM | POA: Diagnosis not present

## 2019-02-19 DIAGNOSIS — E876 Hypokalemia: Secondary | ICD-10-CM | POA: Diagnosis not present

## 2019-02-19 DIAGNOSIS — E1129 Type 2 diabetes mellitus with other diabetic kidney complication: Secondary | ICD-10-CM | POA: Diagnosis not present

## 2019-02-19 DIAGNOSIS — N186 End stage renal disease: Secondary | ICD-10-CM | POA: Diagnosis not present

## 2019-02-19 DIAGNOSIS — N2581 Secondary hyperparathyroidism of renal origin: Secondary | ICD-10-CM | POA: Diagnosis not present

## 2019-02-19 DIAGNOSIS — D509 Iron deficiency anemia, unspecified: Secondary | ICD-10-CM | POA: Diagnosis not present

## 2019-02-22 DIAGNOSIS — N2581 Secondary hyperparathyroidism of renal origin: Secondary | ICD-10-CM | POA: Diagnosis not present

## 2019-02-22 DIAGNOSIS — E1129 Type 2 diabetes mellitus with other diabetic kidney complication: Secondary | ICD-10-CM | POA: Diagnosis not present

## 2019-02-22 DIAGNOSIS — E876 Hypokalemia: Secondary | ICD-10-CM | POA: Diagnosis not present

## 2019-02-22 DIAGNOSIS — D631 Anemia in chronic kidney disease: Secondary | ICD-10-CM | POA: Diagnosis not present

## 2019-02-22 DIAGNOSIS — N186 End stage renal disease: Secondary | ICD-10-CM | POA: Diagnosis not present

## 2019-02-22 DIAGNOSIS — D509 Iron deficiency anemia, unspecified: Secondary | ICD-10-CM | POA: Diagnosis not present

## 2019-02-23 ENCOUNTER — Ambulatory Visit (INDEPENDENT_AMBULATORY_CARE_PROVIDER_SITE_OTHER): Payer: Medicare Other | Admitting: Podiatry

## 2019-02-23 ENCOUNTER — Other Ambulatory Visit: Payer: Self-pay

## 2019-02-23 ENCOUNTER — Ambulatory Visit (INDEPENDENT_AMBULATORY_CARE_PROVIDER_SITE_OTHER): Payer: Medicare Other

## 2019-02-23 ENCOUNTER — Encounter: Payer: Self-pay | Admitting: Podiatry

## 2019-02-23 VITALS — Temp 97.9°F

## 2019-02-23 DIAGNOSIS — B351 Tinea unguium: Secondary | ICD-10-CM

## 2019-02-23 DIAGNOSIS — M79675 Pain in left toe(s): Secondary | ICD-10-CM | POA: Diagnosis not present

## 2019-02-23 DIAGNOSIS — E1151 Type 2 diabetes mellitus with diabetic peripheral angiopathy without gangrene: Secondary | ICD-10-CM

## 2019-02-23 DIAGNOSIS — J4540 Moderate persistent asthma, uncomplicated: Secondary | ICD-10-CM | POA: Diagnosis not present

## 2019-02-23 LAB — HM DIABETES FOOT EXAM

## 2019-02-23 MED ORDER — OMALIZUMAB 150 MG/ML ~~LOC~~ SOSY
300.0000 mg | PREFILLED_SYRINGE | Freq: Once | SUBCUTANEOUS | Status: AC
  Administered 2019-02-23: 12:00:00 300 mg via SUBCUTANEOUS

## 2019-02-23 NOTE — Patient Instructions (Signed)

## 2019-02-23 NOTE — Progress Notes (Signed)
All questions were answered by the patient before medication was administered. Have you been hospitalized in the last 10 days? No Do you have a fever? No Do you have a cough? No Do you have a headache or sore throat? No   Patient requests full dose in left arm

## 2019-02-24 ENCOUNTER — Ambulatory Visit (INDEPENDENT_AMBULATORY_CARE_PROVIDER_SITE_OTHER): Payer: Medicare Other | Admitting: Internal Medicine

## 2019-02-24 ENCOUNTER — Encounter: Payer: Self-pay | Admitting: Internal Medicine

## 2019-02-24 ENCOUNTER — Other Ambulatory Visit: Payer: Self-pay

## 2019-02-24 DIAGNOSIS — E7800 Pure hypercholesterolemia, unspecified: Secondary | ICD-10-CM | POA: Diagnosis not present

## 2019-02-24 DIAGNOSIS — E1122 Type 2 diabetes mellitus with diabetic chronic kidney disease: Secondary | ICD-10-CM

## 2019-02-24 DIAGNOSIS — I255 Ischemic cardiomyopathy: Secondary | ICD-10-CM | POA: Diagnosis not present

## 2019-02-24 DIAGNOSIS — E876 Hypokalemia: Secondary | ICD-10-CM | POA: Diagnosis not present

## 2019-02-24 DIAGNOSIS — M1A3690 Chronic gout due to renal impairment, unspecified knee, without tophus (tophi): Secondary | ICD-10-CM

## 2019-02-24 DIAGNOSIS — E1129 Type 2 diabetes mellitus with other diabetic kidney complication: Secondary | ICD-10-CM | POA: Diagnosis not present

## 2019-02-24 DIAGNOSIS — I739 Peripheral vascular disease, unspecified: Secondary | ICD-10-CM | POA: Diagnosis not present

## 2019-02-24 DIAGNOSIS — D631 Anemia in chronic kidney disease: Secondary | ICD-10-CM | POA: Diagnosis not present

## 2019-02-24 DIAGNOSIS — N186 End stage renal disease: Secondary | ICD-10-CM

## 2019-02-24 DIAGNOSIS — F5101 Primary insomnia: Secondary | ICD-10-CM

## 2019-02-24 DIAGNOSIS — N2581 Secondary hyperparathyroidism of renal origin: Secondary | ICD-10-CM | POA: Diagnosis not present

## 2019-02-24 DIAGNOSIS — D509 Iron deficiency anemia, unspecified: Secondary | ICD-10-CM | POA: Diagnosis not present

## 2019-02-24 DIAGNOSIS — A319 Mycobacterial infection, unspecified: Secondary | ICD-10-CM

## 2019-02-24 LAB — AFB CULTURE WITH SMEAR (NOT AT ARMC)
Acid Fast Culture: POSITIVE — AB
Acid Fast Smear: NEGATIVE

## 2019-02-24 LAB — AFB ID BY DNA PROBE
M avium complex: NEGATIVE
M gordonae: POSITIVE — AB
M kansasii: NEGATIVE
M tuberculosis complex: NEGATIVE

## 2019-02-24 MED ORDER — OMEPRAZOLE 20 MG PO CPDR: 20.0000 mg | DELAYED_RELEASE_CAPSULE | Freq: Every day | ORAL | 3 refills | Status: DC

## 2019-02-24 NOTE — Progress Notes (Signed)
Subjective:    Patient ID: Shane [name], male    DOB: [birth year], 85 y.o.   MRN: [id number]  HPI The patient is here for an acute visit.  Recently he has been experiencing a lot of gas, reflux and constipation.  He was experiencing reflux 2-3 times a day and it was worse at night.  This really started after his omeprazole was discontinued because he was started on Plavix.  His nephrologist advised that he could take it and he restarted the days ago.  His symptoms are already improving.  He thinks the will continue to improve -the medication was controlling her symptoms prior.  Constipation: He started taking MiraLAX daily and that is currently controlled.  He had pneumonia recently and saw pulmonary.  He has been coughing up white sputum and they recently checked his sputum and he was found to have a Mycobacterium infection.  He has been referred to infectious disease.   Diabetes: He is taking the glipizide daily.  His sugars typically run 150-180.  His blood work is being monitored regularly at dialysis.  Insomnia: He is taking the Ambien nightly.  He is unable to sleep without the medication and when he takes it he does sleep well.  Gout: He takes allopurinol daily.  He denies any gout symptoms.     Medications and allergies reviewed with patient and updated if appropriate.  Patient Active Problem List   Diagnosis Date Noted  . ESRD (end stage renal disease) ([place]) 02/05/2019  . Claudication in peripheral vascular disease ([place]) 02/04/2019  . Peripheral arterial disease ([place]) 01/21/2019  . Pneumonia of both lower lobes due to infectious organism ([place]) 12/17/2018  . Shortness of breath 12/03/2018  . Bronchitis 10/13/2018  . Insomnia 05/27/2018  . Pain in left knee 12/15/2017  . ICD (implantable cardioverter-defibrillator) discharge 08/22/2017  . Ventricular tachycardia ([place]) 08/22/2017  . Chronic gout of left knee 07/21/2017  . Diabetic neuropathy ([place]) 04/25/2017  . Gait  abnormality 04/25/2017  . Numbness and tingling of both feet 03/04/2017  . Peripheral neuropathy 03/04/2017  . Carotid artery disease ([name]) 01/09/2017  . Chronic combined systolic and diastolic CHF (congestive heart failure) ([place]) 07/01/2016  . Restrictive lung disease 01/22/2016  . Severe obstructive sleep apnea 12/17/2015  . Essential hypertension 11/16/2015  . 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 LBBB (left bundle branch block) 10/10/2014  . Syncope 10/06/2014  . Sinusitis, chronic 07/16/2013  . Asthma, moderate persistent 07/08/2013  . Benign prostatic hyperplasia without urinary obstruction 12/18/2011  . CAD s/p coronary arthery bypass graft   . Chronic kidney disease (CKD), stage IV (severe) ([place])   . H/O Spinal stenosis   . DM (diabetes mellitus), type 2 with renal complications ([place]) 35/70/1779  . Benign hypertensive heart disease without heart failure 11/07/2010  . Allergic rhinitis 11/07/2010  . Osteoarthritis 11/07/2010  . Ischemic cardiomyopathy   . Hypercholesterolemia     Current Outpatient Medications on File Prior to Visit  Medication Sig Dispense Refill  . albuterol (PROVENTIL) (2.5 MG/3ML) 0.083% nebulizer solution Take 3 mLs (2.5 mg total) by nebulization every 6 (six) hours as needed for wheezing or shortness of breath. 120 mL 3  . Albuterol Sulfate (PROAIR RESPICLICK) 390 (90 Base) MCG/ACT AEPB Inhale 1-2 puffs into the lungs every 6 (six) hours as needed. (Patient taking differently: Inhale 1-2 puffs into the lungs every 6 (six) hours as needed (shortness of breath). ) 1 each 5  . allopurinol (ZYLOPRIM) 100 MG tablet TAKE 1 TABLET DAILY  90 tablet 1  . amiodarone (PACERONE) 200 MG tablet Take 1 tablet (200 mg total) by mouth daily. 90 tablet 2  . aspirin EC 81 MG tablet Take 81 mg by mouth daily.     Marland Kitchen atorvastatin (LIPITOR) 80 MG tablet Take 1 tablet (80 mg total) by mouth every evening. Please make overdue appt with Dr. Acie Fredrickson before anymore refills. 1st attempt 90 tablet  0  . calcitRIOL (ROCALTROL) 0.25 MCG capsule Take 1 capsule (0.25 mcg total) by mouth every Monday, Wednesday, and Friday with hemodialysis. 60 capsule 0  . calcium acetate (PHOSLO) 667 MG capsule Take 667 mg by mouth 3 (three) times daily with meals.     . cetirizine (ZYRTEC) 10 MG tablet Take 10 mg by mouth at bedtime as needed (seasonal allergies).     . clopidogrel (PLAVIX) 75 MG tablet Take 1 tablet (75 mg total) by mouth daily. 90 tablet 3  . clotrimazole-betamethasone (LOTRISONE) lotion Apply to left foot twice daily 30 mL 1  . dicyclomine (BENTYL) 10 MG capsule Take 10 mg by mouth 3 (three) times daily as needed for spasms.   3  . fluticasone furoate-vilanterol (BREO ELLIPTA) 200-25 MCG/INH AEPB Inhale 1 puff into the lungs daily. 180 each 2  . glipiZIDE (GLUCOTROL XL) 5 MG 24 hr tablet Take 5 mg by mouth every evening.   6  . multivitamin (RENA-VIT) TABS tablet Take 1 tablet by mouth at bedtime. 60 tablet 0  . Omalizumab (XOLAIR [place]) Inject into the skin every 21 ( twenty-one) days.    . OXYGEN Inhale 2 L into the lungs See admin instructions. Inhale 2 L into lungs during dialysis on Monday, Wednesday and Friday and every night at bedtime    . polyethylene glycol (MIRALAX / GLYCOLAX) packet Take 17 g by mouth daily as needed for mild constipation. 14 each 0  . Syringe/Needle, Disp, (SYRINGE 3CC/22GX1") 22G X 1" 3 ML MISC Use to give injection    . tadalafil (CIALIS) 20 MG tablet Take 20 mg by mouth daily as needed for erectile dysfunction. Do not exceed 2 doses in 7 days    . testosterone cypionate (DEPOTESTOTERONE CYPIONATE) 100 MG/ML injection Inject 400 mg into the muscle every 21 ( twenty-one) days. For IM use only    . triamcinolone (NASACORT AQ) 55 MCG/ACT AERO nasal inhaler Place 2 sprays into the nose daily as needed (seasonal allergies).     . zolpidem (AMBIEN) 10 MG tablet TAKE 1/2 (ONE-HALF) TABLET BY MOUTH AT BEDTIME TAKE  AN  ADDITIONAL  1/2  TABLET  IF  NEEDED  FOR  SLEEP 30  tablet 2   No current facility-administered medications on file prior to visit.     Past Medical History:  Diagnosis Date  . Abnormal nuclear cardiac imaging test Nov 2010   moderate area of infarct in the inferior wall with only minimal reversibility and EF of 28%  . AICD (automatic cardioverter/defibrillator) present   . Allergic rhinitis 01-08-13   Uses nebulizer for chronic sinus issues and Mucinex.  . Arthritis    osteoarthritis   . Asthma    Extrinic  . Blood transfusion    ? at time of bypass surgery   . BPH (benign prostatic hyperplasia)   . Cellulitis of arm, left    MSSA  . CHF (congestive heart failure) ([place])   . Colon polyps    adenomatous  . Coronary artery disease    remote CABG in 1985, cath in 2003 by Dr.  Stuckey with no PCI, last nuclear in 2010 showing scar and EF of 28%.   . Diabetes mellitus   . Diabetic neuropathy ([place]) 04/25/2017  . Dyslipidemia   . ESRD (end stage renal disease) ([place])   . Gait abnormality 04/25/2017  . Glaucoma 01-08-13   tx. eye drops  . Hemodialysis patient ([place])   . HOH (hard of hearing)   . HTN (hypertension)   . Hypercholesterolemia   . Left ventricular dysfunction    28% per nuclear in 2010 and 35 to 40% per echo in 2010  . Myocardial infarction ([place])    1985  . Neuromuscular disorder ([place])    legs mild paralysis-able to walk"nerve damage"-legs- left leg brace   . Neuropathy   . Pneumonia    hx of several times years ago   . PVD (peripheral vascular disease) ([place])    a. s/p PTA/stenting of L CIA 02/2019.  Marland Kitchen Renal artery stenosis ([facility])    a. moderate bilateral by angio 02/2019.  Marland Kitchen Spinal stenosis     Past Surgical History:  Procedure Laterality Date  . A/V FISTULAGRAM Right 10/17/2016   Procedure: A/V Fistulagram;  Surgeon: Conrad Leake, MD;  Location: [facility];  Service: Cardiovascular;  Laterality: Right;  . ABDOMINAL AORTOGRAM N/A 02/04/2019   Procedure: ABDOMINAL AORTOGRAM;  Surgeon: Lorretta Harp, MD;   Location: [facility];  Service: Cardiovascular;  Laterality: N/A;  . BACK SURGERY     hx of back surgery x 4   . BASCILIC VEIN TRANSPOSITION Right 09/09/2016   Procedure: BASCILIC VEIN TRANSPOSITION Right Arm;  Surgeon: Rosetta Posner, MD;  Location: [facility] OR;  Service: Vascular;  Laterality: Right;  . BI-VENTRICULAR IMPLANTABLE CARDIOVERTER DEFIBRILLATOR N/A 10/10/2014   Procedure: BI-VENTRICULAR IMPLANTABLE CARDIOVERTER DEFIBRILLATOR  (CRT-D);  Surgeon: [name], MD;  Location: [facility] CATH LAB;  Service: Cardiovascular;  Laterality: N/A;  . CARDIAC CATHETERIZATION  2003  . Cataract      recent cataract surgery 6'14  . COLONOSCOPY N/A 01/25/2013   Procedure: COLONOSCOPY;  Surgeon: Irene Shipper, MD;  Location: [facility];  Service: Endoscopy;  Laterality: N/A;  . CORONARY ARTERY BYPASS GRAFT  1985   x 5 vessels  . LOWER EXTREMITY ANGIOGRAPHY Bilateral 02/04/2019   Procedure: Lower Extremity Angiography;  Surgeon: Lorretta Harp, MD;  Location: [facility];  Service: Cardiovascular;  Laterality: Bilateral;  limited study to iliacs  . LUMBAR DISC SURGERY    . PERIPHERAL VASCULAR BALLOON ANGIOPLASTY Right 10/17/2016   Procedure: Peripheral Vascular Balloon Angioplasty;  Surgeon: Conrad [place]hurst, MD;  Location: [facility];  Service: Cardiovascular;  Laterality: Right;  ARM VEINOUS AND CENTRAL VEIN  . PERIPHERAL VASCULAR INTERVENTION Left 02/04/2019   Procedure: PERIPHERAL VASCULAR INTERVENTION;  Surgeon: Lorretta Harp, MD;  Location: [facility];  Service: Cardiovascular;  Laterality: Left;  common iliac  . PR POLYSOM 6/>YRS SLEEP 4/> ADDL PARAM ATTND  12/07/2015  . PROSTATE SURGERY    . TOTAL KNEE ARTHROPLASTY  08/01/2011   Procedure: TOTAL KNEE ARTHROPLASTY;  Surgeon: Johnn Hai;  Location: [facility] ORS;  Service: Orthopedics;  Laterality: Right;    Social History   Socioeconomic History  . Marital status: Married    Spouse name: Not on file  . Number of children: 2   . Years of education: Not on file  . Highest education level: Not on file  Occupational History  . Occupation: Theme park manager  Social Needs  .  Financial resource strain: Not on file  . Food insecurity    Worry: Not on file    Inability: Not on file  . Transportation needs    Medical: Not on file    Non-medical: Not on file  Tobacco Use  . Smoking status: Former Smoker    Packs/day: 1.00    Years: 10.00    Pack years: 10.00    Types: Cigarettes, Pipe, Cigars    Start date: 12/02/1949    Quit date: 08/05/1958    Years since quitting: 60.5  . Smokeless tobacco: Never Used  Substance and Sexual Activity  . Alcohol use: No  . Drug use: No  . Sexual activity: Not on file  Lifestyle  . Physical activity    Days per week: Not on file    Minutes per session: Not on file  . Stress: Not on file  Relationships  . Social Herbalist on phone: Not on file    Gets together: Not on file    Attends religious service: Not on file    Active member of club or organization: Not on file    Attends meetings of clubs or organizations: Not on file    Relationship status: Not on file  Other Topics Concern  . Not on file  Social History Narrative   Originally from [place]. He has always lived in [place]. Prior travel to [place], [place], [place], [place] ,[place], & [place]. 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 Has adopted children. Has a dog currently. No bird exposure. No mold exposure. Enjoys reading & traveling.    Family History  Problem Relation Age of Onset  . Heart attack Father   . Heart disease Father   . Colon polyps Father   . Lung cancer Mother   . Diabetes Sister        x 2  . Heart disease Brother        x 2  . Bone cancer Brother   . Lung disease Neg Hx     Review of Systems  Constitutional: Negative for chills and fever.  Respiratory: Positive for cough and shortness of breath (occ with exertion). Negative for wheezing.   Gastrointestinal: Positive for  abdominal pain and constipation. Negative for nausea.       GERD       Objective:   Vitals:   02/24/19 1514  BP: 124/68  Pulse: 82  Resp: 16  Temp: 98.2 F (36.8 C)  SpO2: 97%   BP Readings from Last 3 Encounters:  02/24/19 124/68  02/05/19 (!) 146/76  01/21/19 (!) 160/73   Wt Readings from Last 3 Encounters:  02/24/19 200 lb 1.9 oz (90.8 kg)  02/04/19 196 lb (88.9 kg)  01/21/19 196 lb (88.9 kg)   Body mass index is 29.55 kg/m.   Physical Exam    Constitutional: Appears well-developed and well-nourished. No distress.  HENT:  Head: Normocephalic and atraumatic.  Neck: Neck supple. No tracheal deviation present. No thyromegaly present.  No cervical lymphadenopathy Cardiovascular: Normal rate, regular rhythm and normal heart sounds.   No murmur heard. No carotid bruit .  No edema Pulmonary/Chest: Effort normal and breath sounds normal. No respiratory distress. No has no wheezes. No rales.  Abdomen: Soft, nontender, nondistended Skin: Skin is warm and dry. Not diaphoretic.  Psychiatric: Normal mood and affect. Behavior is normal.       Assessment & Plan:    See Problem List for Assessment and Plan  of chronic medical problems.

## 2019-02-24 NOTE — Assessment & Plan Note (Signed)
Blood work closely monitored at dialysis Sugars 160s-180

## 2019-02-24 NOTE — Assessment & Plan Note (Signed)
In place the most common iliac artery 02/2019-placed on Plavix

## 2019-02-24 NOTE — Patient Instructions (Signed)
Continue your current medications.

## 2019-02-24 NOTE — Assessment & Plan Note (Signed)
On hemodialysis

## 2019-02-24 NOTE — Assessment & Plan Note (Signed)
Taking allopurinol daily No gout symptoms Continue

## 2019-02-24 NOTE — Assessment & Plan Note (Signed)
Well-controlled Continue intermittent

## 2019-02-24 NOTE — Assessment & Plan Note (Signed)
Continue atorvastatin

## 2019-02-25 ENCOUNTER — Ambulatory Visit (INDEPENDENT_AMBULATORY_CARE_PROVIDER_SITE_OTHER): Payer: Medicare Other | Admitting: *Deleted

## 2019-02-25 ENCOUNTER — Inpatient Hospital Stay (HOSPITAL_COMMUNITY): Admission: RE | Admit: 2019-02-25 | Payer: Medicare Other | Source: Ambulatory Visit

## 2019-02-25 DIAGNOSIS — I472 Ventricular tachycardia, unspecified: Secondary | ICD-10-CM

## 2019-02-25 DIAGNOSIS — I447 Left bundle-branch block, unspecified: Secondary | ICD-10-CM

## 2019-02-25 LAB — CUP PACEART REMOTE DEVICE CHECK
Battery Remaining Longevity: 25 mo
Battery Voltage: 2.88 V
Brady Statistic AP VP Percent: 0.04 %
Brady Statistic AP VS Percent: 0.01 %
Brady Statistic AS VP Percent: 96.95 %
Brady Statistic AS VS Percent: 2.99 %
Brady Statistic RA Percent Paced: 0.06 %
Brady Statistic RV Percent Paced: 2.65 %
Date Time Interrogation Session: 20200723082407
HighPow Impedance: 65 Ohm
Implantable Lead Implant Date: 20160308
Implantable Lead Implant Date: 20160308
Implantable Lead Implant Date: 20160308
Implantable Lead Location: 753858
Implantable Lead Location: 753859
Implantable Lead Location: 753860
Implantable Lead Model: 4598
Implantable Lead Model: 5076
Implantable Lead Model: 6935
Implantable Pulse Generator Implant Date: 20160308
Lead Channel Impedance Value: 285 Ohm
Lead Channel Impedance Value: 285 Ohm
Lead Channel Impedance Value: 323 Ohm
Lead Channel Impedance Value: 323 Ohm
Lead Channel Impedance Value: 323 Ohm
Lead Channel Impedance Value: 399 Ohm
Lead Channel Impedance Value: 399 Ohm
Lead Channel Impedance Value: 399 Ohm
Lead Channel Impedance Value: 513 Ohm
Lead Channel Impedance Value: 513 Ohm
Lead Channel Impedance Value: 570 Ohm
Lead Channel Impedance Value: 608 Ohm
Lead Channel Impedance Value: 608 Ohm
Lead Channel Pacing Threshold Amplitude: 0.75 V
Lead Channel Pacing Threshold Amplitude: 1.125 V
Lead Channel Pacing Threshold Amplitude: 1.875 V
Lead Channel Pacing Threshold Pulse Width: 0.4 ms
Lead Channel Pacing Threshold Pulse Width: 0.4 ms
Lead Channel Pacing Threshold Pulse Width: 0.6 ms
Lead Channel Sensing Intrinsic Amplitude: 2.375 mV
Lead Channel Sensing Intrinsic Amplitude: 3 mV
Lead Channel Setting Pacing Amplitude: 2 V
Lead Channel Setting Pacing Amplitude: 2.25 V
Lead Channel Setting Pacing Amplitude: 4 V
Lead Channel Setting Pacing Pulse Width: 0.6 ms
Lead Channel Setting Pacing Pulse Width: 0.8 ms
Lead Channel Setting Sensing Sensitivity: 0.3 mV

## 2019-02-26 DIAGNOSIS — D631 Anemia in chronic kidney disease: Secondary | ICD-10-CM | POA: Diagnosis not present

## 2019-02-26 DIAGNOSIS — N186 End stage renal disease: Secondary | ICD-10-CM | POA: Diagnosis not present

## 2019-02-26 DIAGNOSIS — N2581 Secondary hyperparathyroidism of renal origin: Secondary | ICD-10-CM | POA: Diagnosis not present

## 2019-02-26 DIAGNOSIS — D509 Iron deficiency anemia, unspecified: Secondary | ICD-10-CM | POA: Diagnosis not present

## 2019-02-26 DIAGNOSIS — E1129 Type 2 diabetes mellitus with other diabetic kidney complication: Secondary | ICD-10-CM | POA: Diagnosis not present

## 2019-02-26 DIAGNOSIS — E876 Hypokalemia: Secondary | ICD-10-CM | POA: Diagnosis not present

## 2019-02-28 NOTE — Progress Notes (Signed)
Subjective: "I wish I could hug you. They found a blockage in my leg." Shane [name] is an 85 y.o. y.o. male who presents for preventative foot care with h/o of NIDDM with PAD. He is seen for treatment of painful, discolored, thick toenails and painful callus/corn which interfere with daily activities. Pain is aggravated when wearing enclosed shoe gear and is relieved with periodic professional debridement.  He states he did have vascular studies performed and was found to have blockage causing his LLE symptoms. He had intervention performed for left occluded iliac arter by Dr. Gwenlyn Found earlier this month. He relates his left LE feels better now.     Shane Rail, MD is his PCP.    Current Outpatient Medications:  .  albuterol (PROVENTIL) (2.5 MG/3ML) 0.083% nebulizer solution, Take 3 mLs (2.5 mg total) by nebulization every 6 (six) hours as needed for wheezing or shortness of breath., Disp: 120 mL, Rfl: 3 .  Albuterol Sulfate (PROAIR [place]) 599 (90 Base) MCG/ACT AEPB, Inhale 1-2 puffs into the lungs every 6 (six) hours as needed. (Patient taking differently: Inhale 1-2 puffs into the lungs every 6 (six) hours as needed (shortness of breath). ), Disp: 1 each, Rfl: 5 .  allopurinol (ZYLOPRIM) 100 MG tablet, TAKE 1 TABLET DAILY, Disp: 90 tablet, Rfl: 1 .  amiodarone (PACERONE) 200 MG tablet, Take 1 tablet (200 mg total) by mouth daily., Disp: 90 tablet, Rfl: 2 .  aspirin EC 81 MG tablet, Take 81 mg by mouth daily. , Disp: , Rfl:  .  atorvastatin (LIPITOR) 80 MG tablet, Take 1 tablet (80 mg total) by mouth every evening. Please make overdue appt with Dr. Acie Fredrickson before anymore refills. 1st attempt, Disp: 90 tablet, Rfl: 0 .  calcitRIOL (ROCALTROL) 0.25 MCG capsule, Take 1 capsule (0.25 mcg total) by mouth every Monday, Wednesday, and Friday with hemodialysis., Disp: 60 capsule, Rfl: 0 .  calcium acetate (PHOSLO) 667 MG capsule, Take 667 mg by mouth 3 (three) times daily with meals. , Disp: , Rfl:   .  cetirizine (ZYRTEC) 10 MG tablet, Take 10 mg by mouth at bedtime as needed (seasonal allergies). , Disp: , Rfl:  .  clopidogrel (PLAVIX) 75 MG tablet, Take 1 tablet (75 mg total) by mouth daily., Disp: 90 tablet, Rfl: 3 .  clotrimazole-betamethasone (LOTRISONE) lotion, Apply to left foot twice daily, Disp: 30 mL, Rfl: 1 .  dicyclomine (BENTYL) 10 MG capsule, Take 10 mg by mouth 3 (three) times daily as needed for spasms. , Disp: , Rfl: 3 .  fluticasone furoate-vilanterol (BREO ELLIPTA) 200-25 MCG/INH AEPB, Inhale 1 puff into the lungs daily., Disp: 180 each, Rfl: 2 .  glipiZIDE (GLUCOTROL XL) 5 MG 24 hr tablet, Take 5 mg by mouth every evening. , Disp: , Rfl: 6 .  multivitamin (RENA-VIT) TABS tablet, Take 1 tablet by mouth at bedtime., Disp: 60 tablet, Rfl: 0 .  Omalizumab (XOLAIR ), Inject into the skin every 21 ( twenty-one) days., Disp: , Rfl:  .  omeprazole (PRILOSEC) 20 MG capsule, Take 1 capsule (20 mg total) by mouth daily., Disp: 30 capsule, Rfl: 3 .  OXYGEN, Inhale 2 L into the lungs See admin instructions. Inhale 2 L into lungs during dialysis on Monday, Wednesday and Friday and every night at bedtime, Disp: , Rfl:  .  polyethylene glycol (MIRALAX / GLYCOLAX) packet, Take 17 g by mouth daily as needed for mild constipation., Disp: 14 each, Rfl: 0 .  Syringe/Needle, Disp, (SYRINGE 3CC/22GX1") 22G X  1" 3 ML MISC, Use to give injection, Disp: , Rfl:  .  tadalafil (CIALIS) 20 MG tablet, Take 20 mg by mouth daily as needed for erectile dysfunction. Do not exceed 2 doses in 7 days, Disp: , Rfl:  .  testosterone cypionate (DEPOTESTOTERONE CYPIONATE) 100 MG/ML injection, Inject 400 mg into the muscle every 21 ( twenty-one) days. For IM use only, Disp: , Rfl:  .  triamcinolone (NASACORT AQ) 55 MCG/ACT AERO nasal inhaler, Place 2 sprays into the nose daily as needed (seasonal allergies). , Disp: , Rfl:  .  zolpidem (AMBIEN) 10 MG tablet, TAKE 1/2 (ONE-HALF) TABLET BY MOUTH AT BEDTIME TAKE  AN   ADDITIONAL  1/2  TABLET  IF  NEEDED  FOR  SLEEP, Disp: 30 tablet, Rfl: 2  Allergies  Allergen Reactions  . Codeine Other (See Comments)    anxiety  . Hydrocodone Other (See Comments)    Anxiety- can take in liquid form  . Pseudoephedrine Other (See Comments)    Causes heart to race  . Azithromycin Rash    Objective: Vitals:   02/23/19 1545  Temp: 97.9 F (36.6 C)    Vascular Examination: Capillary refill time immediate x 10 digits.  Dorsalis pedis pulses 1/4 right, 2/4 left.  Posterior tibial pulses nonpalpable right; faintly palpable left.  No digital hair x 10 digits.  Skin temperature WNL b/l.  Dermatological Examination: Skin with age related atrophy b/l.  Toenails 1-5 b/l discolored, thick, dystrophic with subungual debris and pain with palpation to nailbeds due to thickness of nails.  Musculoskeletal: Muscle strength 5/5 to all LE muscle groups.  Neurological: Sensation intact 5/5 b/l with 10 gram monofilament.  Vibratory sensation intact b/l.  Assessment: 1. Painful onychomycosis toenails 1-5 b/l 2. NIDDM with Peripheral arterial disease  Plan: 1. Continue diabetic foot care principles. Literature dispensed. 2. Toenails 1-5 b/l were debrided in length and girth without iatrogenic bleeding. 3. Patient to continue soft, supportive shoe gear daily. 4. Patient to report any pedal injuries to medical professional immediately. 5. Follow up 3 months.  6. Patient/POA to call should there be a concern in the interim.

## 2019-03-01 ENCOUNTER — Telehealth: Payer: Self-pay | Admitting: Internal Medicine

## 2019-03-01 DIAGNOSIS — N2581 Secondary hyperparathyroidism of renal origin: Secondary | ICD-10-CM | POA: Diagnosis not present

## 2019-03-01 DIAGNOSIS — D631 Anemia in chronic kidney disease: Secondary | ICD-10-CM | POA: Diagnosis not present

## 2019-03-01 DIAGNOSIS — E1129 Type 2 diabetes mellitus with other diabetic kidney complication: Secondary | ICD-10-CM | POA: Diagnosis not present

## 2019-03-01 DIAGNOSIS — D509 Iron deficiency anemia, unspecified: Secondary | ICD-10-CM | POA: Diagnosis not present

## 2019-03-01 DIAGNOSIS — E876 Hypokalemia: Secondary | ICD-10-CM | POA: Diagnosis not present

## 2019-03-01 DIAGNOSIS — N186 End stage renal disease: Secondary | ICD-10-CM | POA: Diagnosis not present

## 2019-03-01 NOTE — Telephone Encounter (Signed)
COVID-19 Pre-Screening Questions: ° °Do you currently have a fever (>100 °F), chills or unexplained body aches? N ° °Are you currently experiencing new cough, shortness of breath, sore throat, runny nose? N °•  °Have you recently travelled outside the state of [place] in the last 14 days? N  °•  °Have you been in contact with someone that is currently pending confirmation of Covid19 testing or has been confirmed to have the Covid19 virus?  N ° °**If the patient answers NO to ALL questions -  advise the patient to please call the clinic before coming to the office should any symptoms develop.  ° ° ° °

## 2019-03-02 ENCOUNTER — Other Ambulatory Visit: Payer: Self-pay

## 2019-03-02 ENCOUNTER — Ambulatory Visit (INDEPENDENT_AMBULATORY_CARE_PROVIDER_SITE_OTHER): Payer: Medicare Other | Admitting: Internal Medicine

## 2019-03-02 ENCOUNTER — Other Ambulatory Visit: Payer: Self-pay | Admitting: Physician Assistant

## 2019-03-02 ENCOUNTER — Encounter: Payer: Self-pay | Admitting: Internal Medicine

## 2019-03-02 ENCOUNTER — Ambulatory Visit (HOSPITAL_BASED_OUTPATIENT_CLINIC_OR_DEPARTMENT_OTHER)
Admission: RE | Admit: 2019-03-02 | Discharge: 2019-03-02 | Disposition: A | Discharge status: Home / Self Care | Payer: Medicare Other | Source: Ambulatory Visit | Attending: Physician Assistant | Admitting: Physician Assistant

## 2019-03-02 ENCOUNTER — Ambulatory Visit (HOSPITAL_COMMUNITY)
Admission: RE | Admit: 2019-03-02 | Discharge: 2019-03-02 | Disposition: A | Discharge status: Home / Self Care | Payer: Medicare Other | Source: Ambulatory Visit | Attending: Cardiology | Admitting: Cardiology

## 2019-03-02 VITALS — BP 163/75 | HR 87 | Temp 98.0°F

## 2019-03-02 DIAGNOSIS — Z95828 Presence of other vascular implants and grafts: Secondary | ICD-10-CM | POA: Diagnosis not present

## 2019-03-02 DIAGNOSIS — J189 Pneumonia, unspecified organism: Secondary | ICD-10-CM

## 2019-03-02 DIAGNOSIS — I739 Peripheral vascular disease, unspecified: Secondary | ICD-10-CM

## 2019-03-02 DIAGNOSIS — I255 Ischemic cardiomyopathy: Secondary | ICD-10-CM

## 2019-03-02 DIAGNOSIS — J181 Lobar pneumonia, unspecified organism: Secondary | ICD-10-CM | POA: Diagnosis not present

## 2019-03-02 NOTE — Progress Notes (Signed)
[facility]  Reason for Consult: Sputum culture positive for Mycobacterium gordonae Referring Provider: Eric Form, NP  Assessment: Mr. Edelman has recovered from his community-acquired pneumonia.  Mycobacterium gordonae is almost never a human pathogen.  It is most likely an insignificant contaminant and it does not need any further evaluation or treatment.  Plan: 1. No antibiotic treatment needed 2. He may follow-up here as needed  Patient Active Problem List   Diagnosis Date Noted  . ESRD (end stage renal disease) ([place]) 02/05/2019  . Claudication in peripheral vascular disease ([place]) 02/04/2019  . Peripheral arterial disease ([place]) 01/21/2019  . Pneumonia of both lower lobes due to infectious organism ([place]) 12/17/2018  . Shortness of breath 12/03/2018  . Bronchitis 10/13/2018  . Insomnia 05/27/2018  . ICD (implantable cardioverter-defibrillator) discharge 08/22/2017  . Ventricular tachycardia ([place]) 08/22/2017  . Gout 07/21/2017  . Diabetic neuropathy ([place]) 04/25/2017  . Gait abnormality 04/25/2017  . Numbness and tingling of both feet 03/04/2017  . Peripheral neuropathy 03/04/2017  . Carotid artery disease ([place]) 01/09/2017  . Chronic combined systolic and diastolic CHF (congestive heart failure) ([place]) 07/01/2016  . Restrictive lung disease 01/22/2016  . Severe obstructive sleep apnea 12/17/2015  . 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 LBBB (left bundle branch block) 10/10/2014  . Syncope 10/06/2014  . Sinusitis, chronic 07/16/2013  . Asthma, moderate persistent 07/08/2013  . Benign prostatic hyperplasia without urinary obstruction 12/18/2011  . CAD s/p coronary arthery bypass graft   . H/O Spinal stenosis   . DM (diabetes mellitus), type 2 with renal complications ([place]) 40/98/1191  . Benign hypertensive heart disease without heart failure 11/07/2010  . Allergic rhinitis 11/07/2010  . Osteoarthritis 11/07/2010  . Ischemic cardiomyopathy   .  Hypercholesterolemia     Patient's Medications  New Prescriptions   No medications on file  Previous Medications   ALBUTEROL (PROVENTIL) (2.5 MG/3ML) 0.083% NEBULIZER SOLUTION    Take 3 mLs (2.5 mg total) by nebulization every 6 (six) hours as needed for wheezing or shortness of breath.   ALBUTEROL SULFATE (PROAIR RESPICLICK) 478 (90 BASE) MCG/ACT AEPB    Inhale 1-2 puffs into the lungs every 6 (six) hours as needed.   ALLOPURINOL (ZYLOPRIM) 100 MG TABLET    TAKE 1 TABLET DAILY   AMIODARONE (PACERONE) 200 MG TABLET    Take 1 tablet (200 mg total) by mouth daily.   ASPIRIN EC 81 MG TABLET    Take 81 mg by mouth daily.    ATORVASTATIN (LIPITOR) 80 MG TABLET    Take 1 tablet (80 mg total) by mouth every evening. Please make overdue appt with Dr. Acie Fredrickson before anymore refills. 1st attempt   CALCITRIOL (ROCALTROL) 0.25 MCG CAPSULE    Take 1 capsule (0.25 mcg total) by mouth every Monday, Wednesday, and Friday with hemodialysis.   CALCIUM ACETATE (PHOSLO) 667 MG CAPSULE    Take 667 mg by mouth 3 (three) times daily with meals.    CETIRIZINE (ZYRTEC) 10 MG TABLET    Take 10 mg by mouth at bedtime as needed (seasonal allergies).    CLOPIDOGREL (PLAVIX) 75 MG TABLET    Take 1 tablet (75 mg total) by mouth daily.   CLOTRIMAZOLE-BETAMETHASONE (LOTRISONE) LOTION    Apply to left foot twice daily   DICYCLOMINE (BENTYL) 10 MG CAPSULE    Take 10 mg by mouth 3 (three) times daily as needed for spasms.    FLUTICASONE FUROATE-VILANTEROL (BREO ELLIPTA) 200-25 MCG/INH AEPB  Inhale 1 puff into the lungs daily.   GLIPIZIDE (GLUCOTROL XL) 5 MG 24 HR TABLET    Take 5 mg by mouth every evening.    MULTIVITAMIN (RENA-VIT) TABS TABLET    Take 1 tablet by mouth at bedtime.   OMALIZUMAB (XOLAIR [place])    Inject into the skin every 21 ( twenty-one) days.   OMEPRAZOLE (PRILOSEC) 20 MG CAPSULE    Take 1 capsule (20 mg total) by mouth daily.   OXYGEN    Inhale 2 L into the lungs See admin instructions. Inhale 2 L into lungs  during dialysis on Monday, Wednesday and Friday and every night at bedtime   POLYETHYLENE GLYCOL (MIRALAX / GLYCOLAX) PACKET    Take 17 g by mouth daily as needed for mild constipation.   SYRINGE/NEEDLE, DISP, (SYRINGE 3CC/22GX1") 22G X 1" 3 ML MISC    Use to give injection   TADALAFIL (CIALIS) 20 MG TABLET    Take 20 mg by mouth daily as needed for erectile dysfunction. Do not exceed 2 doses in 7 days   TESTOSTERONE CYPIONATE (DEPOTESTOTERONE CYPIONATE) 100 MG/ML INJECTION    Inject 400 mg into the muscle every 21 ( twenty-one) days. For IM use only   TRIAMCINOLONE (NASACORT AQ) 55 MCG/ACT AERO NASAL INHALER    Place 2 sprays into the nose daily as needed (seasonal allergies).    ZOLPIDEM (AMBIEN) 10 MG TABLET    TAKE 1/2 (ONE-HALF) TABLET BY MOUTH AT BEDTIME TAKE  AN  ADDITIONAL  1/2  TABLET  IF  NEEDED  FOR  SLEEP  Modified Medications   No medications on file  Discontinued Medications   No medications on file    HPI: Shane [name] is a 85 y.o. male former smoker who developed worsening shortness of breath and productive cough in late April.  He was afebrile at the time.  Chest x-ray revealed mild bilateral reticulonodular opacities.  His COVID-19 PCR was negative.  He was treated with empiric amoxicillin clavulanate and prednisone and had significant improvement.  Repeat chest x-ray on 12/29/2018 showed clearing of his infiltrates.  He was treated with short course of doxycycline.  He was feeling much better when he followed up on 01/08/2019.  Repeat chest x-ray was again without any acute changes.  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  Review of Systems: Review of Systems  Constitutional: Negative for chills, diaphoresis, fever and weight loss.  Respiratory: Negative for cough, sputum production and shortness of breath.   Cardiovascular: Negative for chest pain.  Gastrointestinal: Positive for constipation and heartburn. Negative for abdominal pain, diarrhea, nausea  and vomiting.      Past Medical History:  Diagnosis Date  . Abnormal nuclear cardiac imaging test Nov 2010   moderate area of infarct in the inferior wall with only minimal reversibility and EF of 28%  . AICD (automatic cardioverter/defibrillator) present   . Allergic rhinitis 01-08-13   Uses nebulizer for chronic sinus issues and Mucinex.  . Arthritis    osteoarthritis   . Asthma    Extrinic  . Blood transfusion    ? at time of bypass surgery   . BPH (benign prostatic hyperplasia)   . Cellulitis of arm, left    MSSA  . CHF (congestive heart failure) ([place])   . Colon polyps    adenomatous  . Coronary artery disease    remote CABG in 1985, cath in 2003 by Dr. Lia Foyer with no PCI, last nuclear in 2010 showing scar  and EF of 28%.   . Diabetes mellitus   . Diabetic neuropathy ([place]) 04/25/2017  . Dyslipidemia   . ESRD (end stage renal disease) ([place])   . Gait abnormality 04/25/2017  . Glaucoma 01-08-13   tx. eye drops  . Hemodialysis patient ([place])   . HOH (hard of hearing)   . HTN (hypertension)   . Hypercholesterolemia   . Left ventricular dysfunction    28% per nuclear in 2010 and 35 to 40% per echo in 2010  . Myocardial infarction ([place])    1985  . Neuromuscular disorder ([place])    legs mild paralysis-able to walk"nerve damage"-legs- left leg brace   . Neuropathy   . Pneumonia    hx of several times years ago   . PVD (peripheral vascular disease) ([place])    a. s/p PTA/stenting of L CIA 02/2019.  Marland Kitchen Renal artery stenosis ([facility])    a. moderate bilateral by angio 02/2019.  Marland Kitchen Spinal stenosis     Social History   Tobacco Use  . Smoking status: Former Smoker    Packs/day: 1.00    Years: 10.00    Pack years: 10.00    Types: Cigarettes, Pipe, Cigars    Start date: 12/02/1949    Quit date: 08/05/1958    Years since quitting: 60.6  . Smokeless tobacco: Never Used  Substance Use Topics  . Alcohol use: No  . Drug use: No    Family History  Problem Relation Age of Onset  . Heart  attack Father   . Heart disease Father   . Colon polyps Father   . Lung cancer Mother   . Diabetes Sister        x 2  . Heart disease Brother        x 2  . Bone cancer Brother   . Lung disease Neg Hx    Allergies  Allergen Reactions  . Codeine Other (See Comments)    anxiety  . Hydrocodone Other (See Comments)    Anxiety- can take in liquid form  . Pseudoephedrine Other (See Comments)    Causes heart to race  . Azithromycin Rash    OBJECTIVE: Vitals:   03/02/19 1503  BP: (!) 163/75  Pulse: 87  Temp: 98 F (36.7 C)   There is no height or weight on file to calculate BMI.   Physical Exam Constitutional:      Comments: He is pleasant and in no distress.  Cardiovascular:     Rate and Rhythm: Normal rate and regular rhythm.     Heart sounds: Murmur present.     Comments: He has a 1-2 over 6 early systolic murmur. Pulmonary:     Effort: Pulmonary effort is normal.     Breath sounds: Normal breath sounds. No wheezing or rales.  Psychiatric:        Mood and Affect: Mood normal.     Microbiology: No results found for this or any previous visit (from the past 240 hour(s)).  Michel Bickers, MD [facility] [phone] pager   [phone] cell 03/02/2019, 3:22 PM

## 2019-03-03 DIAGNOSIS — D509 Iron deficiency anemia, unspecified: Secondary | ICD-10-CM | POA: Diagnosis not present

## 2019-03-03 DIAGNOSIS — N2581 Secondary hyperparathyroidism of renal origin: Secondary | ICD-10-CM | POA: Diagnosis not present

## 2019-03-03 DIAGNOSIS — N186 End stage renal disease: Secondary | ICD-10-CM | POA: Diagnosis not present

## 2019-03-03 DIAGNOSIS — E876 Hypokalemia: Secondary | ICD-10-CM | POA: Diagnosis not present

## 2019-03-03 DIAGNOSIS — D631 Anemia in chronic kidney disease: Secondary | ICD-10-CM | POA: Diagnosis not present

## 2019-03-03 DIAGNOSIS — E1129 Type 2 diabetes mellitus with other diabetic kidney complication: Secondary | ICD-10-CM | POA: Diagnosis not present

## 2019-03-05 DIAGNOSIS — N186 End stage renal disease: Secondary | ICD-10-CM | POA: Diagnosis not present

## 2019-03-05 DIAGNOSIS — D631 Anemia in chronic kidney disease: Secondary | ICD-10-CM | POA: Diagnosis not present

## 2019-03-05 DIAGNOSIS — N2581 Secondary hyperparathyroidism of renal origin: Secondary | ICD-10-CM | POA: Diagnosis not present

## 2019-03-05 DIAGNOSIS — E1129 Type 2 diabetes mellitus with other diabetic kidney complication: Secondary | ICD-10-CM | POA: Diagnosis not present

## 2019-03-05 DIAGNOSIS — E876 Hypokalemia: Secondary | ICD-10-CM | POA: Diagnosis not present

## 2019-03-05 DIAGNOSIS — D509 Iron deficiency anemia, unspecified: Secondary | ICD-10-CM | POA: Diagnosis not present

## 2019-03-08 ENCOUNTER — Telehealth: Payer: Self-pay | Admitting: Pulmonary Disease

## 2019-03-08 ENCOUNTER — Telehealth: Payer: Self-pay | Admitting: Cardiovascular Disease

## 2019-03-08 DIAGNOSIS — Z992 Dependence on renal dialysis: Secondary | ICD-10-CM | POA: Diagnosis not present

## 2019-03-08 DIAGNOSIS — D631 Anemia in chronic kidney disease: Secondary | ICD-10-CM | POA: Diagnosis not present

## 2019-03-08 DIAGNOSIS — E1129 Type 2 diabetes mellitus with other diabetic kidney complication: Secondary | ICD-10-CM | POA: Diagnosis not present

## 2019-03-08 DIAGNOSIS — N2581 Secondary hyperparathyroidism of renal origin: Secondary | ICD-10-CM | POA: Diagnosis not present

## 2019-03-08 DIAGNOSIS — N186 End stage renal disease: Secondary | ICD-10-CM | POA: Diagnosis not present

## 2019-03-08 DIAGNOSIS — D509 Iron deficiency anemia, unspecified: Secondary | ICD-10-CM | POA: Diagnosis not present

## 2019-03-08 NOTE — Telephone Encounter (Signed)
New Message      Pt c/o medication issue:  1. Name of Medication: Plavix   2. How are you currently taking this medication (dosage and times per day)? 75mg  1 x daily   3. Are you having a reaction (difficulty breathing--STAT)? No   4. What is your medication issue? Pt says he is bleeding a lot and would like to decrease the dose

## 2019-03-08 NOTE — Telephone Encounter (Signed)
If he has continued bleeding on Plavix he can discontinue this after 1 September.

## 2019-03-08 NOTE — Telephone Encounter (Signed)
Spoke with patient of Dr. Gwenlyn Found (PAD) who had PV intervention in July and was started on Plavix. He reports since then, when he goes to dialysis three times weekly, they have a very hard time getting the bleeding to stop. He states last Wednesday he woke up in the middle of the night and noticed his bandage was very saturated and it was on his sheets. He would like MD advice on what to do about this. Routed to Dr. Gwenlyn Found & Chima RN

## 2019-03-08 NOTE — Telephone Encounter (Signed)
Xolair Prefilled Syringe Order: 150mg  Prefilled Syringe:  #4 75mg  Prefilled Syringe: n/a Ordered Date: 03/08/19  Expected date of arrival: 03/09/19  Ordered by: Parke Poisson, [place]: Nigel Mormon

## 2019-03-08 NOTE — Progress Notes (Signed)
Remote ICD transmission.   

## 2019-03-09 DIAGNOSIS — L280 Lichen simplex chronicus: Secondary | ICD-10-CM | POA: Diagnosis not present

## 2019-03-09 DIAGNOSIS — L820 Inflamed seborrheic keratosis: Secondary | ICD-10-CM | POA: Diagnosis not present

## 2019-03-09 DIAGNOSIS — C44319 Basal cell carcinoma of skin of other parts of face: Secondary | ICD-10-CM | POA: Diagnosis not present

## 2019-03-09 DIAGNOSIS — L821 Other seborrheic keratosis: Secondary | ICD-10-CM | POA: Diagnosis not present

## 2019-03-09 NOTE — Telephone Encounter (Signed)
Xolair Prefilled Syringe Received:  150mg  Prefilled Syringe >> quantity 4, lot # [id number], exp date 12/04/2019 75mg  Prefilled Syringe >> N/A Medication arrival date: 03/09/19 Received by: Lattie Haw T,LPN

## 2019-03-10 DIAGNOSIS — D509 Iron deficiency anemia, unspecified: Secondary | ICD-10-CM | POA: Diagnosis not present

## 2019-03-10 DIAGNOSIS — Z992 Dependence on renal dialysis: Secondary | ICD-10-CM | POA: Diagnosis not present

## 2019-03-10 DIAGNOSIS — N2581 Secondary hyperparathyroidism of renal origin: Secondary | ICD-10-CM | POA: Diagnosis not present

## 2019-03-10 DIAGNOSIS — E1129 Type 2 diabetes mellitus with other diabetic kidney complication: Secondary | ICD-10-CM | POA: Diagnosis not present

## 2019-03-10 DIAGNOSIS — N186 End stage renal disease: Secondary | ICD-10-CM | POA: Diagnosis not present

## 2019-03-10 DIAGNOSIS — D631 Anemia in chronic kidney disease: Secondary | ICD-10-CM | POA: Diagnosis not present

## 2019-03-12 DIAGNOSIS — E1129 Type 2 diabetes mellitus with other diabetic kidney complication: Secondary | ICD-10-CM | POA: Diagnosis not present

## 2019-03-12 DIAGNOSIS — N186 End stage renal disease: Secondary | ICD-10-CM | POA: Diagnosis not present

## 2019-03-12 DIAGNOSIS — Z992 Dependence on renal dialysis: Secondary | ICD-10-CM | POA: Diagnosis not present

## 2019-03-12 DIAGNOSIS — D509 Iron deficiency anemia, unspecified: Secondary | ICD-10-CM | POA: Diagnosis not present

## 2019-03-12 DIAGNOSIS — D631 Anemia in chronic kidney disease: Secondary | ICD-10-CM | POA: Diagnosis not present

## 2019-03-12 DIAGNOSIS — N2581 Secondary hyperparathyroidism of renal origin: Secondary | ICD-10-CM | POA: Diagnosis not present

## 2019-03-15 ENCOUNTER — Other Ambulatory Visit: Payer: Self-pay | Admitting: Pulmonary Disease

## 2019-03-15 ENCOUNTER — Telehealth: Payer: Self-pay | Admitting: Pulmonary Disease

## 2019-03-15 DIAGNOSIS — D509 Iron deficiency anemia, unspecified: Secondary | ICD-10-CM | POA: Diagnosis not present

## 2019-03-15 DIAGNOSIS — N2581 Secondary hyperparathyroidism of renal origin: Secondary | ICD-10-CM | POA: Diagnosis not present

## 2019-03-15 DIAGNOSIS — D631 Anemia in chronic kidney disease: Secondary | ICD-10-CM | POA: Diagnosis not present

## 2019-03-15 DIAGNOSIS — E1129 Type 2 diabetes mellitus with other diabetic kidney complication: Secondary | ICD-10-CM | POA: Diagnosis not present

## 2019-03-15 DIAGNOSIS — N186 End stage renal disease: Secondary | ICD-10-CM | POA: Diagnosis not present

## 2019-03-15 DIAGNOSIS — Z992 Dependence on renal dialysis: Secondary | ICD-10-CM | POA: Diagnosis not present

## 2019-03-15 MED ORDER — EPINEPHRINE 0.3 MG/0.3ML IJ SOAJ: 0.3000 mg | Freq: Once | INTRAMUSCULAR | 5 refills | Status: AC

## 2019-03-15 NOTE — Telephone Encounter (Signed)
Called and spoke with Patient to go over covid screening and to remind patient to bring epipen to injection scheduled 03/16/19.  Patient stated his epipen had expired.  New epipen prescription sent to requested pharmacy, [facility], [address].  Nothing further at this time.

## 2019-03-16 ENCOUNTER — Other Ambulatory Visit: Payer: Self-pay

## 2019-03-16 ENCOUNTER — Ambulatory Visit (INDEPENDENT_AMBULATORY_CARE_PROVIDER_SITE_OTHER): Payer: Medicare Other

## 2019-03-16 DIAGNOSIS — J4540 Moderate persistent asthma, uncomplicated: Secondary | ICD-10-CM | POA: Diagnosis not present

## 2019-03-16 MED ORDER — OMALIZUMAB 150 MG/ML ~~LOC~~ SOSY
300.0000 mg | PREFILLED_SYRINGE | Freq: Once | SUBCUTANEOUS | Status: AC
  Administered 2019-03-16: 300 mg via SUBCUTANEOUS

## 2019-03-16 NOTE — Progress Notes (Signed)
Have you been hospitalized within the last 10 days?  No Do you have a fever?  No Do you have a cough?  No Do you have a headache or sore throat? No Do you have your Epi Pen visible and is it within date?  Yes 

## 2019-03-17 DIAGNOSIS — N186 End stage renal disease: Secondary | ICD-10-CM | POA: Diagnosis not present

## 2019-03-17 DIAGNOSIS — D631 Anemia in chronic kidney disease: Secondary | ICD-10-CM | POA: Diagnosis not present

## 2019-03-17 DIAGNOSIS — Z992 Dependence on renal dialysis: Secondary | ICD-10-CM | POA: Diagnosis not present

## 2019-03-17 DIAGNOSIS — N2581 Secondary hyperparathyroidism of renal origin: Secondary | ICD-10-CM | POA: Diagnosis not present

## 2019-03-17 DIAGNOSIS — D509 Iron deficiency anemia, unspecified: Secondary | ICD-10-CM | POA: Diagnosis not present

## 2019-03-17 DIAGNOSIS — E1129 Type 2 diabetes mellitus with other diabetic kidney complication: Secondary | ICD-10-CM | POA: Diagnosis not present

## 2019-03-18 NOTE — Telephone Encounter (Signed)
DPR on file. Left detailed message on [phone] of the following recommendations: "If he has continued bleeding on Plavix he can discontinue this after 1 September."

## 2019-03-19 DIAGNOSIS — N2581 Secondary hyperparathyroidism of renal origin: Secondary | ICD-10-CM | POA: Diagnosis not present

## 2019-03-19 DIAGNOSIS — D631 Anemia in chronic kidney disease: Secondary | ICD-10-CM | POA: Diagnosis not present

## 2019-03-19 DIAGNOSIS — E1129 Type 2 diabetes mellitus with other diabetic kidney complication: Secondary | ICD-10-CM | POA: Diagnosis not present

## 2019-03-19 DIAGNOSIS — Z992 Dependence on renal dialysis: Secondary | ICD-10-CM | POA: Diagnosis not present

## 2019-03-19 DIAGNOSIS — N186 End stage renal disease: Secondary | ICD-10-CM | POA: Diagnosis not present

## 2019-03-19 DIAGNOSIS — D509 Iron deficiency anemia, unspecified: Secondary | ICD-10-CM | POA: Diagnosis not present

## 2019-03-22 DIAGNOSIS — N2581 Secondary hyperparathyroidism of renal origin: Secondary | ICD-10-CM | POA: Diagnosis not present

## 2019-03-22 DIAGNOSIS — D631 Anemia in chronic kidney disease: Secondary | ICD-10-CM | POA: Diagnosis not present

## 2019-03-22 DIAGNOSIS — N186 End stage renal disease: Secondary | ICD-10-CM | POA: Diagnosis not present

## 2019-03-22 DIAGNOSIS — D509 Iron deficiency anemia, unspecified: Secondary | ICD-10-CM | POA: Diagnosis not present

## 2019-03-22 DIAGNOSIS — E1129 Type 2 diabetes mellitus with other diabetic kidney complication: Secondary | ICD-10-CM | POA: Diagnosis not present

## 2019-03-22 DIAGNOSIS — Z992 Dependence on renal dialysis: Secondary | ICD-10-CM | POA: Diagnosis not present

## 2019-03-24 DIAGNOSIS — N186 End stage renal disease: Secondary | ICD-10-CM | POA: Diagnosis not present

## 2019-03-24 DIAGNOSIS — Z992 Dependence on renal dialysis: Secondary | ICD-10-CM | POA: Diagnosis not present

## 2019-03-24 DIAGNOSIS — D509 Iron deficiency anemia, unspecified: Secondary | ICD-10-CM | POA: Diagnosis not present

## 2019-03-24 DIAGNOSIS — N2581 Secondary hyperparathyroidism of renal origin: Secondary | ICD-10-CM | POA: Diagnosis not present

## 2019-03-24 DIAGNOSIS — E1129 Type 2 diabetes mellitus with other diabetic kidney complication: Secondary | ICD-10-CM | POA: Diagnosis not present

## 2019-03-24 DIAGNOSIS — D631 Anemia in chronic kidney disease: Secondary | ICD-10-CM | POA: Diagnosis not present

## 2019-03-26 DIAGNOSIS — E1129 Type 2 diabetes mellitus with other diabetic kidney complication: Secondary | ICD-10-CM | POA: Diagnosis not present

## 2019-03-26 DIAGNOSIS — D509 Iron deficiency anemia, unspecified: Secondary | ICD-10-CM | POA: Diagnosis not present

## 2019-03-26 DIAGNOSIS — N2581 Secondary hyperparathyroidism of renal origin: Secondary | ICD-10-CM | POA: Diagnosis not present

## 2019-03-26 DIAGNOSIS — N186 End stage renal disease: Secondary | ICD-10-CM | POA: Diagnosis not present

## 2019-03-26 DIAGNOSIS — Z992 Dependence on renal dialysis: Secondary | ICD-10-CM | POA: Diagnosis not present

## 2019-03-26 DIAGNOSIS — D631 Anemia in chronic kidney disease: Secondary | ICD-10-CM | POA: Diagnosis not present

## 2019-03-29 DIAGNOSIS — D631 Anemia in chronic kidney disease: Secondary | ICD-10-CM | POA: Diagnosis not present

## 2019-03-29 DIAGNOSIS — D509 Iron deficiency anemia, unspecified: Secondary | ICD-10-CM | POA: Diagnosis not present

## 2019-03-29 DIAGNOSIS — Z992 Dependence on renal dialysis: Secondary | ICD-10-CM | POA: Diagnosis not present

## 2019-03-29 DIAGNOSIS — N2581 Secondary hyperparathyroidism of renal origin: Secondary | ICD-10-CM | POA: Diagnosis not present

## 2019-03-29 DIAGNOSIS — N186 End stage renal disease: Secondary | ICD-10-CM | POA: Diagnosis not present

## 2019-03-29 DIAGNOSIS — E1129 Type 2 diabetes mellitus with other diabetic kidney complication: Secondary | ICD-10-CM | POA: Diagnosis not present

## 2019-03-31 DIAGNOSIS — N2581 Secondary hyperparathyroidism of renal origin: Secondary | ICD-10-CM | POA: Diagnosis not present

## 2019-03-31 DIAGNOSIS — D631 Anemia in chronic kidney disease: Secondary | ICD-10-CM | POA: Diagnosis not present

## 2019-03-31 DIAGNOSIS — N186 End stage renal disease: Secondary | ICD-10-CM | POA: Diagnosis not present

## 2019-03-31 DIAGNOSIS — D509 Iron deficiency anemia, unspecified: Secondary | ICD-10-CM | POA: Diagnosis not present

## 2019-03-31 DIAGNOSIS — Z992 Dependence on renal dialysis: Secondary | ICD-10-CM | POA: Diagnosis not present

## 2019-03-31 DIAGNOSIS — E1129 Type 2 diabetes mellitus with other diabetic kidney complication: Secondary | ICD-10-CM | POA: Diagnosis not present

## 2019-04-02 DIAGNOSIS — N186 End stage renal disease: Secondary | ICD-10-CM | POA: Diagnosis not present

## 2019-04-02 DIAGNOSIS — D631 Anemia in chronic kidney disease: Secondary | ICD-10-CM | POA: Diagnosis not present

## 2019-04-02 DIAGNOSIS — D509 Iron deficiency anemia, unspecified: Secondary | ICD-10-CM | POA: Diagnosis not present

## 2019-04-02 DIAGNOSIS — Z992 Dependence on renal dialysis: Secondary | ICD-10-CM | POA: Diagnosis not present

## 2019-04-02 DIAGNOSIS — N2581 Secondary hyperparathyroidism of renal origin: Secondary | ICD-10-CM | POA: Diagnosis not present

## 2019-04-02 DIAGNOSIS — E1129 Type 2 diabetes mellitus with other diabetic kidney complication: Secondary | ICD-10-CM | POA: Diagnosis not present

## 2019-04-05 DIAGNOSIS — Z992 Dependence on renal dialysis: Secondary | ICD-10-CM | POA: Diagnosis not present

## 2019-04-05 DIAGNOSIS — D631 Anemia in chronic kidney disease: Secondary | ICD-10-CM | POA: Diagnosis not present

## 2019-04-05 DIAGNOSIS — D509 Iron deficiency anemia, unspecified: Secondary | ICD-10-CM | POA: Diagnosis not present

## 2019-04-05 DIAGNOSIS — I129 Hypertensive chronic kidney disease with stage 1 through stage 4 chronic kidney disease, or unspecified chronic kidney disease: Secondary | ICD-10-CM | POA: Diagnosis not present

## 2019-04-05 DIAGNOSIS — N186 End stage renal disease: Secondary | ICD-10-CM | POA: Diagnosis not present

## 2019-04-05 DIAGNOSIS — N2581 Secondary hyperparathyroidism of renal origin: Secondary | ICD-10-CM | POA: Diagnosis not present

## 2019-04-05 DIAGNOSIS — E1129 Type 2 diabetes mellitus with other diabetic kidney complication: Secondary | ICD-10-CM | POA: Diagnosis not present

## 2019-04-06 ENCOUNTER — Ambulatory Visit: Payer: Medicare Other

## 2019-04-06 DIAGNOSIS — Z992 Dependence on renal dialysis: Secondary | ICD-10-CM | POA: Diagnosis not present

## 2019-04-06 DIAGNOSIS — N186 End stage renal disease: Secondary | ICD-10-CM | POA: Diagnosis not present

## 2019-04-06 DIAGNOSIS — I129 Hypertensive chronic kidney disease with stage 1 through stage 4 chronic kidney disease, or unspecified chronic kidney disease: Secondary | ICD-10-CM | POA: Diagnosis not present

## 2019-04-07 DIAGNOSIS — D631 Anemia in chronic kidney disease: Secondary | ICD-10-CM | POA: Diagnosis not present

## 2019-04-07 DIAGNOSIS — N186 End stage renal disease: Secondary | ICD-10-CM | POA: Diagnosis not present

## 2019-04-07 DIAGNOSIS — Z992 Dependence on renal dialysis: Secondary | ICD-10-CM | POA: Diagnosis not present

## 2019-04-07 DIAGNOSIS — N2581 Secondary hyperparathyroidism of renal origin: Secondary | ICD-10-CM | POA: Diagnosis not present

## 2019-04-07 DIAGNOSIS — E1129 Type 2 diabetes mellitus with other diabetic kidney complication: Secondary | ICD-10-CM | POA: Diagnosis not present

## 2019-04-07 DIAGNOSIS — Z23 Encounter for immunization: Secondary | ICD-10-CM | POA: Diagnosis not present

## 2019-04-07 DIAGNOSIS — D509 Iron deficiency anemia, unspecified: Secondary | ICD-10-CM | POA: Diagnosis not present

## 2019-04-08 ENCOUNTER — Ambulatory Visit (INDEPENDENT_AMBULATORY_CARE_PROVIDER_SITE_OTHER): Payer: Medicare Other | Admitting: Pulmonary Disease

## 2019-04-08 ENCOUNTER — Ambulatory Visit (INDEPENDENT_AMBULATORY_CARE_PROVIDER_SITE_OTHER): Payer: Medicare Other

## 2019-04-08 ENCOUNTER — Encounter: Payer: Self-pay | Admitting: Pulmonary Disease

## 2019-04-08 ENCOUNTER — Other Ambulatory Visit: Payer: Self-pay

## 2019-04-08 VITALS — BP 112/72 | HR 85 | Temp 98.6°F | Ht 69.0 in | Wt 200.4 lb

## 2019-04-08 DIAGNOSIS — J4540 Moderate persistent asthma, uncomplicated: Secondary | ICD-10-CM

## 2019-04-08 DIAGNOSIS — G4733 Obstructive sleep apnea (adult) (pediatric): Secondary | ICD-10-CM

## 2019-04-08 DIAGNOSIS — K219 Gastro-esophageal reflux disease without esophagitis: Secondary | ICD-10-CM | POA: Diagnosis not present

## 2019-04-08 DIAGNOSIS — N186 End stage renal disease: Secondary | ICD-10-CM | POA: Diagnosis not present

## 2019-04-08 DIAGNOSIS — Z0000 Encounter for general adult medical examination without abnormal findings: Secondary | ICD-10-CM | POA: Diagnosis not present

## 2019-04-08 MED ORDER — OMALIZUMAB 150 MG/ML ~~LOC~~ SOSY
300.0000 mg | PREFILLED_SYRINGE | Freq: Once | SUBCUTANEOUS | Status: AC
  Administered 2019-04-08: 12:00:00 300 mg via SUBCUTANEOUS

## 2019-04-08 NOTE — Assessment & Plan Note (Addendum)
Plan: Requested pneumonia shot records from nephrology office Patient is due for high-dose flu vaccine as well as Pneumovax 23 This should be further evaluated at next office visit once we receive shot records from nephrology

## 2019-04-08 NOTE — Assessment & Plan Note (Signed)
Plan: Continue Monday Wednesday Friday HD

## 2019-04-08 NOTE — Patient Instructions (Addendum)
You were seen today by Lauraine Rinne, NP  for:   1. Moderate persistent asthma without complication  Breo Ellipta 200 >>>Take 1 puff daily in the morning right when you wake up >>>Rinse your mouth out after use >>>This is a daily maintenance inhaler, NOTa rescue inhaler >>>Contact our office if you are having difficulties affording or obtaining this medication >>>It is important for you to be able to take this daily and not miss any doses  Only use your albuterol as a rescue medication to be used if you can't catch your breath by resting or doing a relaxed purse lip breathing pattern.  - The less you use it, the better it will work when you need it. - Ok to use up to 2 puffs every 4 hours if you must but call for immediate appointment if use goes up over your usual need - Don't leave home without it !! (think of it like the spare tire for your car)   Continue Xolair as prescribed  Continue zyrtec  We will work to get you established with Dr. Melvyn Novas.  This will be a 30-minute visit.  Please bring all medications with you to this appointment.  2. Severe obstructive sleep apnea  We respect your inability to use her CPAP due to claustrophobia Continue oxygen therapy as prescribed  3. Healthcare maintenance  We will check to see if you have had your pneumonia shot at your nephrologist office, if not then we would recommend that she receive your Pneumovax 23 We recommend that she should receive your flu vaccine in fall/2020   We recommend today:  No orders of the defined types were placed in this encounter.  No orders of the defined types were placed in this encounter.  No orders of the defined types were placed in this encounter.   Follow Up:    Return in about 6 weeks (around 05/20/2019), or if symptoms worsen or fail to improve, for Follow up with Dr. Melvyn Novas - 11min to establish with all medications in hand .   Please do your part to reduce the spread of COVID-19:      Reduce your risk of any infection  and COVID19 by using the similar precautions used for avoiding the common cold or flu:  Marland Kitchen Wash your hands often with soap and warm water for at least 20 seconds.  If soap and water are not readily available, use an alcohol-based hand sanitizer with at least 60% alcohol.  . If coughing or sneezing, cover your mouth and nose by coughing or sneezing into the elbow areas of your shirt or coat, into a tissue or into your sleeve (not your hands). Langley Gauss A MASK when in public  . Avoid shaking hands with others and consider head nods or verbal greetings only. . Avoid touching your eyes, nose, or mouth with unwashed hands.  . Avoid close contact with people who are sick. . Avoid places or events with large numbers of people in one location, like concerts or sporting events. . If you have some symptoms but not all symptoms, continue to monitor at home and seek medical attention if your symptoms worsen. . If you are having a medical emergency, call 911.   [place] / e-Visit: [email or web address]         [facility]: [facility]: [phone]  [facility]: [phone]     It is flu season:   >>> Best ways to protect herself from the flu: Receive the yearly flu vaccine, practice good hand hygiene washing with soap and also using hand sanitizer when available, eat a nutritious meals, get adequate rest, hydrate appropriately   Please contact the office if your symptoms worsen or you have concerns that you are not improving.   Thank you for choosing [facility] for your healthcare, and for allowing [place] to partner with you on your healthcare journey. I am thankful to be able to provide care to you today.   Wyn Quaker FNP-C

## 2019-04-08 NOTE — Assessment & Plan Note (Signed)
Plan: Continue Xolair Continue Breo Ellipta 200 Continue rescue inhaler or nebulized meds as needed for shortness of breath and wheezing We will request shot records from nephrology -currently patient is due for high-dose flu vaccine as well as Pneumovax 23 Patient to establish with Dr. Melvyn Novas in 6 weeks

## 2019-04-08 NOTE — Progress Notes (Signed)
All questions were answered by the patient before medication was administered. Have you been hospitalized in the last 10 days? No Do you have a fever? No Do you have a cough? No Do you have a headache or sore throat? No  

## 2019-04-08 NOTE — Assessment & Plan Note (Signed)
Patient reporting worsened acid reflux.  Nephrology recently increased omeprazole to 40 mg in the morning and 20 mg at night  Plan: Continue omeprazole as outlined by nephrology Offered gastroenterology referral today, patient declined

## 2019-04-08 NOTE — Assessment & Plan Note (Signed)
Patient intolerant of CPAP.  Plan: Continue oxygen therapy as prescribed

## 2019-04-08 NOTE — Progress Notes (Signed)
Reviewed, agree 

## 2019-04-08 NOTE — Progress Notes (Signed)
@Patient  ID: Shane [name], male    DOB: 06-19-1934, 85 y.o.   MRN: [id number]  Chief Complaint  Patient presents with  . Follow-up    Asthma     Referring provider: [name], MD  HPI:  85 year old male, former smoker quit in [place] (5 pack year hx). Followed in our office for Asthma.   PMH: significant for moderate persistent asthma, DM type 2 with renal complixations, MWF HD Smoker/ Smoking History: Former Smoker. 1960. 5 pack year history.   Maintenance:  Breo 200, Xolair q3 weeks Pt of: Dr. Lake Bells  04/08/2019  - Visit   85 year old male former smoker followed in our office for asthma.  Patient has had elevated IgE's in the past.  He is maintained on Brio Ellipta 200 as well as Xolair injections every 3 weeks.  He is followed by Dr. Lake Bells.  He reports that overall his breathing has been quite stable.  He continues with Monday Wednesday Friday hemodialysis.  He has had some breakthrough reflux which he has been working with nephrology on.  He is currently taking omeprazole 40 mg in the morning and 20 mg at night.  He is not have a gastroenterologist.  He received his Xolair injection today.   Tests:   08/26/2017-chest x-ray-no active cardiopulmonary disease  12/03/2018-chest x-ray- reticular nodular opacities in right greater than left lung, may represent multifocal pneumonia  01/01/2018-echocardiogram-LV ejection fraction 40 to 06%, grade 1 diastolic dysfunction, mild mitral regurgitation  12/07/2015- split-night sleep study- AHI 51.5  07/07/2013 - Allergy profile - 0.28 ragweed, ige 209  01/08/2019-VQ scan- negative  FENO:  No results found for: NITRICOXIDE  PFT: PFT Results Latest Ref Rng & Units 01/22/2016 07/07/2013  FVC-Pre L 2.97 -  FVC-Predicted Pre % 72 91  FVC-Post L 3.06 3.75  FVC-Predicted Post % 74 89  Pre FEV1/FVC % % 75 74  Post FEV1/FCV % % 77 79  FEV1-Pre L 2.23 2.85  FEV1-Predicted Pre % 76 94  FEV1-Post L 2.37 2.96  DLCO UNC% % 58 64  DLCO COR  %Predicted % 82 78  TLC L 5.56 5.94  TLC % Predicted % 77 83  RV % Predicted % 91 81    Imaging: No results found.    Specialty Problems      Pulmonary Problems   Allergic rhinitis    07/07/2013 - Allergy profile - 0.28 ragweed, ige 209       Asthma, moderate persistent    Pulmonary functions on 07/07/2013: FEV1 98% predicted FVC 89% predicted FEV1 to FVC ratio 79% predicted total lung capacity 83% predicted diffusion capacity 64% predicted airway resistance 163% predicted IgE level greater than 200 07/15/13 CT Sinus : sinusitis;   Ct Chest : normal      Sinusitis, chronic   Severe obstructive sleep apnea    12/07/2015- split-night sleep study- AHI 51.5      Restrictive lung disease   Bronchitis   Shortness of breath   Pneumonia of both lower lobes due to infectious organism ([place])    12/03/2018-chest x-ray- reticular nodular opacities in right greater than left lung, may represent multifocal pneumonia          Allergies  Allergen Reactions  . Codeine Other (See Comments)    anxiety  . Hydrocodone Other (See Comments)    Anxiety- can take in liquid form  . Pseudoephedrine Other (See Comments)    Causes heart to race  . Azithromycin Rash    Immunization  History  Administered Date(s) Administered  . Influenza Split 05/05/2013, 06/22/2015  . Influenza Whole 06/03/2011  . Influenza, High Dose Seasonal PF 04/08/2018  . Influenza,inj,quad, With Preservative 05/05/2017  . Influenza-Unspecified 06/01/2016, 06/05/2017  . Pneumococcal Conjugate-13 07/17/2015    Past Medical History:  Diagnosis Date  . Abnormal nuclear cardiac imaging test Nov 2010   moderate area of infarct in the inferior wall with only minimal reversibility and EF of 28%  . AICD (automatic cardioverter/defibrillator) present   . Allergic rhinitis 01-08-13   Uses nebulizer for chronic sinus issues and Mucinex.  . Arthritis    osteoarthritis   . Asthma    Extrinic  . Blood transfusion    ? at  time of bypass surgery   . BPH (benign prostatic hyperplasia)   . Cellulitis of arm, left    MSSA  . CHF (congestive heart failure) ([place])   . Colon polyps    adenomatous  . Coronary artery disease    remote CABG in 1985, cath in 2003 by Dr. Lia Foyer with no PCI, last nuclear in 2010 showing scar and EF of 28%.   . Diabetes mellitus   . Diabetic neuropathy ([place]) 04/25/2017  . Dyslipidemia   . ESRD (end stage renal disease) ([place])   . Gait abnormality 04/25/2017  . Glaucoma 01-08-13   tx. eye drops  . Hemodialysis patient ([place])   . HOH (hard of hearing)   . HTN (hypertension)   . Hypercholesterolemia   . Left ventricular dysfunction    28% per nuclear in 2010 and 35 to 40% per echo in 2010  . Myocardial infarction ([place])    1985  . Neuromuscular disorder ([place])    legs mild paralysis-able to walk"nerve damage"-legs- left leg brace   . Neuropathy   . Pneumonia    hx of several times years ago   . PVD (peripheral vascular disease) ([place])    a. s/p PTA/stenting of L CIA 02/2019.  Marland Kitchen Renal artery stenosis ([facility])    a. moderate bilateral by angio 02/2019.  Marland Kitchen Spinal stenosis     Tobacco History: Social History   Tobacco Use  Smoking Status Former Smoker  . Packs/day: 1.00  . Years: 10.00  . Pack years: 10.00  . Types: Cigarettes, Pipe, Cigars  . Start date: 12/02/1949  . Quit date: 08/05/1958  . Years since quitting: 60.7  Smokeless Tobacco Never Used   Counseling given: Yes   Continue to not smoke  Outpatient Encounter Medications as of 04/08/2019  Medication Sig  . albuterol (PROVENTIL) (2.5 MG/3ML) 0.083% nebulizer solution Take 3 mLs (2.5 mg total) by nebulization every 6 (six) hours as needed for wheezing or shortness of breath.  . Albuterol Sulfate (PROAIR RESPICLICK) 270 (90 Base) MCG/ACT AEPB Inhale 1-2 puffs into the lungs every 6 (six) hours as needed. (Patient taking differently: Inhale 1-2 puffs into the lungs every 6 (six) hours as needed (shortness of breath). )  .  allopurinol (ZYLOPRIM) 100 MG tablet TAKE 1 TABLET DAILY  . amiodarone (PACERONE) 200 MG tablet Take 1 tablet (200 mg total) by mouth daily.  Marland Kitchen aspirin EC 81 MG tablet Take 81 mg by mouth daily.   Marland Kitchen atorvastatin (LIPITOR) 80 MG tablet Take 1 tablet (80 mg total) by mouth every evening. Please make overdue appt with Dr. Acie Fredrickson before anymore refills. 1st attempt  . calcitRIOL (ROCALTROL) 0.25 MCG capsule Take 1 capsule (0.25 mcg total) by mouth every Monday, Wednesday, and Friday with hemodialysis.  Marland Kitchen calcium acetate (  PHOSLO) 667 MG capsule Take 667 mg by mouth 3 (three) times daily with meals.   . cetirizine (ZYRTEC) 10 MG tablet Take 10 mg by mouth at bedtime as needed (seasonal allergies).   . clopidogrel (PLAVIX) 75 MG tablet Take 1 tablet (75 mg total) by mouth daily.  . clotrimazole-betamethasone (LOTRISONE) lotion Apply to left foot twice daily  . dicyclomine (BENTYL) 10 MG capsule Take 10 mg by mouth 3 (three) times daily as needed for spasms.   . fluticasone furoate-vilanterol (BREO ELLIPTA) 200-25 MCG/INH AEPB Inhale 1 puff into the lungs daily.  Marland Kitchen glipiZIDE (GLUCOTROL XL) 5 MG 24 hr tablet Take 5 mg by mouth every evening.   . multivitamin (RENA-VIT) TABS tablet Take 1 tablet by mouth at bedtime.  . Omalizumab (XOLAIR [place]) Inject into the skin every 21 ( twenty-one) days.  Marland Kitchen omeprazole (PRILOSEC) 20 MG capsule Take 1 capsule (20 mg total) by mouth daily.  . OXYGEN Inhale 2 L into the lungs See admin instructions. Inhale 2 L into lungs during dialysis on Monday, Wednesday and Friday and every night at bedtime  . polyethylene glycol (MIRALAX / GLYCOLAX) packet Take 17 g by mouth daily as needed for mild constipation.  . Syringe/Needle, Disp, (SYRINGE 3CC/22GX1") 22G X 1" 3 ML MISC Use to give injection  . tadalafil (CIALIS) 20 MG tablet Take 20 mg by mouth daily as needed for erectile dysfunction. Do not exceed 2 doses in 7 days  . testosterone cypionate (DEPOTESTOTERONE CYPIONATE) 100  MG/ML injection Inject 400 mg into the muscle every 21 ( twenty-one) days. For IM use only  . triamcinolone (NASACORT AQ) 55 MCG/ACT AERO nasal inhaler Place 2 sprays into the nose daily as needed (seasonal allergies).   . zolpidem (AMBIEN) 10 MG tablet TAKE 1/2 (ONE-HALF) TABLET BY MOUTH AT BEDTIME TAKE  AN  ADDITIONAL  1/2  TABLET  IF  NEEDED  FOR  SLEEP  . [EXPIRED] omalizumab [place] Right) prefilled syringe 300 mg    No facility-administered encounter medications on file as of 04/08/2019.      Review of Systems  Review of Systems  Constitutional: Negative for activity change, chills, fatigue, fever and unexpected weight change.  HENT: Negative for rhinorrhea, sinus pressure, sinus pain and sore throat.   Eyes: Negative.   Respiratory: Negative for cough, shortness of breath and wheezing.   Cardiovascular: Negative for chest pain, palpitations and leg swelling.  Gastrointestinal: Negative for constipation, diarrhea, nausea and vomiting.  Endocrine: Negative.   Genitourinary: Negative.   Musculoskeletal: Negative.   Skin: Negative.   Allergic/Immunologic: Positive for environmental allergies.  Neurological: Negative for dizziness and headaches.  Psychiatric/Behavioral: Negative.  Negative for dysphoric mood. The patient is not nervous/anxious.   All other systems reviewed and are negative.    Physical Exam  BP 112/72   Pulse 85   Temp 98.6 F (37 C) (Oral)   Ht 5\' 9"  (1.753 m)   Wt 200 lb 6.4 oz (90.9 kg)   SpO2 96%   BMI 29.59 kg/m   Wt Readings from Last 5 Encounters:  04/08/19 200 lb 6.4 oz (90.9 kg)  02/24/19 200 lb 1.9 oz (90.8 kg)  02/04/19 196 lb (88.9 kg)  01/21/19 196 lb (88.9 kg)  01/07/19 203 lb 6.4 oz (92.3 kg)     Physical Exam Vitals signs and nursing note reviewed.  Constitutional:      General: He is not in acute distress.    Appearance: Normal appearance. He is obese.  HENT:  Head: Normocephalic and atraumatic.     Right Ear: External ear normal.      Left Ear: External ear normal.     Ears:     Comments: Hard of hearing, hearing aids bilaterally    Nose: Nose normal. No mucosal edema or rhinorrhea.     Right Turbinates: Not enlarged.     Left Turbinates: Not enlarged.     Mouth/Throat:     Mouth: Mucous membranes are dry.     Pharynx: Oropharynx is clear. No oropharyngeal exudate.  Eyes:     Pupils: Pupils are equal, round, and reactive to light.  Neck:     Musculoskeletal: Normal range of motion.  Cardiovascular:     Rate and Rhythm: Normal rate and regular rhythm.     Pulses: Normal pulses.     Heart sounds: Normal heart sounds. No murmur.  Pulmonary:     Effort: Pulmonary effort is normal. No respiratory distress.     Breath sounds: Normal breath sounds. No decreased breath sounds, wheezing or rales.  Abdominal:     General: Bowel sounds are normal. There is no distension.     Palpations: Abdomen is soft.     Tenderness: There is no abdominal tenderness.  Musculoskeletal:     Right lower leg: No edema.     Left lower leg: No edema.  Lymphadenopathy:     Cervical: No cervical adenopathy.  Skin:    General: Skin is warm and dry.     Capillary Refill: Capillary refill takes less than 2 seconds.     Findings: No erythema or rash.  Neurological:     General: No focal deficit present.     Mental Status: He is alert and oriented to person, place, and time.     Motor: No weakness.     Coordination: Coordination normal.     Gait: Gait is intact. Gait normal.  Psychiatric:        Mood and Affect: Mood normal.        Behavior: Behavior normal. Behavior is cooperative.        Thought Content: Thought content normal.        Judgment: Judgment normal.      Lab Results:  CBC    Component Value Date/Time   WBC 4.2 02/04/2019 0638   RBC 3.35 (L) 02/04/2019 0638   HGB 11.0 (L) 02/04/2019 0638   HGB 10.3 (L) 02/02/2019 1037   HCT 35.7 (L) 02/04/2019 0638   HCT 31.2 (L) 02/02/2019 1037   PLT 125 (L) 02/04/2019 0638    PLT CANCELED 02/02/2019 1037   MCV 106.6 (H) 02/04/2019 0638   MCV 98 (H) 02/02/2019 1037   MCH 32.8 02/04/2019 0638   MCHC 30.8 02/04/2019 0638   RDW 17.1 (H) 02/04/2019 0638   RDW 14.1 02/02/2019 1037   LYMPHSABS 0.6 (L) 12/29/2018 1428   LYMPHSABS 0.6 (L) 10/27/2017 1222   MONOABS 0.4 12/29/2018 1428   EOSABS 0.1 12/29/2018 1428   EOSABS 0.0 10/27/2017 1222   BASOSABS 0.0 12/29/2018 1428   BASOSABS 0.0 10/27/2017 1222    BMET    Component Value Date/Time   NA 139 02/04/2019 0638   NA 144 02/02/2019 1037   K 4.9 02/04/2019 0638   CL 98 02/04/2019 0638   CO2 29 02/04/2019 0638   GLUCOSE 114 (H) 02/04/2019 0638   BUN 26 (H) 02/04/2019 0638   BUN 30 (H) 02/02/2019 1037   CREATININE 3.96 (H) 02/04/2019 0638   CREATININE 3.09 (  H) 07/01/2016 0907   CALCIUM 9.0 02/04/2019 0638   GFRNONAA 13 (L) 02/04/2019 0638   GFRNONAA 21 (L) 02/15/2016 1607   GFRAA 15 (L) 02/04/2019 0638   GFRAA 24 (L) 02/15/2016 1607    BNP    Component Value Date/Time   BNP 681.1 (H) 12/16/2017 0133    ProBNP    Component Value Date/Time   PROBNP 2,417.0 (H) 12/29/2018 1428      Assessment & Plan:   Asthma, moderate persistent Plan: Continue Xolair Continue Breo Ellipta 200 Continue rescue inhaler or nebulized meds as needed for shortness of breath and wheezing We will request shot records from nephrology -currently patient is due for high-dose flu vaccine as well as Pneumovax 23 Patient to establish with Dr. Melvyn Novas in 6 weeks   Severe obstructive sleep apnea Patient intolerant of CPAP.  Plan: Continue oxygen therapy as prescribed  GERD (gastroesophageal reflux disease) Patient reporting worsened acid reflux.  Nephrology recently increased omeprazole to 40 mg in the morning and 20 mg at night  Plan: Continue omeprazole as outlined by nephrology Offered gastroenterology referral today, patient declined   ESRD (end stage renal disease) ([place]) Plan: Continue Monday Wednesday  Friday HD  Healthcare maintenance Plan: Requested pneumonia shot records from nephrology office Patient is due for high-dose flu vaccine as well as Pneumovax 23 This should be further evaluated at next office visit once we receive shot records from nephrology    Return in about 6 weeks (around 05/20/2019), or if symptoms worsen or fail to improve, for Follow up with Dr. Melvyn Novas - 26min to establish with all medications in hand .   Lauraine Rinne, NP 04/08/2019   This appointment was 28 minutes long with over 50% of the time in direct face-to-face patient care, assessment, plan of care, and follow-up.

## 2019-04-09 DIAGNOSIS — E1129 Type 2 diabetes mellitus with other diabetic kidney complication: Secondary | ICD-10-CM | POA: Diagnosis not present

## 2019-04-09 DIAGNOSIS — D509 Iron deficiency anemia, unspecified: Secondary | ICD-10-CM | POA: Diagnosis not present

## 2019-04-09 DIAGNOSIS — Z992 Dependence on renal dialysis: Secondary | ICD-10-CM | POA: Diagnosis not present

## 2019-04-09 DIAGNOSIS — Z23 Encounter for immunization: Secondary | ICD-10-CM | POA: Diagnosis not present

## 2019-04-09 DIAGNOSIS — N2581 Secondary hyperparathyroidism of renal origin: Secondary | ICD-10-CM | POA: Diagnosis not present

## 2019-04-09 DIAGNOSIS — N186 End stage renal disease: Secondary | ICD-10-CM | POA: Diagnosis not present

## 2019-04-12 DIAGNOSIS — N186 End stage renal disease: Secondary | ICD-10-CM | POA: Diagnosis not present

## 2019-04-12 DIAGNOSIS — E1129 Type 2 diabetes mellitus with other diabetic kidney complication: Secondary | ICD-10-CM | POA: Diagnosis not present

## 2019-04-12 DIAGNOSIS — Z992 Dependence on renal dialysis: Secondary | ICD-10-CM | POA: Diagnosis not present

## 2019-04-12 DIAGNOSIS — N2581 Secondary hyperparathyroidism of renal origin: Secondary | ICD-10-CM | POA: Diagnosis not present

## 2019-04-12 DIAGNOSIS — Z23 Encounter for immunization: Secondary | ICD-10-CM | POA: Diagnosis not present

## 2019-04-12 DIAGNOSIS — D509 Iron deficiency anemia, unspecified: Secondary | ICD-10-CM | POA: Diagnosis not present

## 2019-04-13 DIAGNOSIS — C44319 Basal cell carcinoma of skin of other parts of face: Secondary | ICD-10-CM | POA: Diagnosis not present

## 2019-04-13 DIAGNOSIS — L570 Actinic keratosis: Secondary | ICD-10-CM | POA: Diagnosis not present

## 2019-04-13 DIAGNOSIS — X32XXXD Exposure to sunlight, subsequent encounter: Secondary | ICD-10-CM | POA: Diagnosis not present

## 2019-04-14 DIAGNOSIS — N186 End stage renal disease: Secondary | ICD-10-CM | POA: Diagnosis not present

## 2019-04-14 DIAGNOSIS — Z23 Encounter for immunization: Secondary | ICD-10-CM | POA: Diagnosis not present

## 2019-04-14 DIAGNOSIS — E1129 Type 2 diabetes mellitus with other diabetic kidney complication: Secondary | ICD-10-CM | POA: Diagnosis not present

## 2019-04-14 DIAGNOSIS — Z992 Dependence on renal dialysis: Secondary | ICD-10-CM | POA: Diagnosis not present

## 2019-04-14 DIAGNOSIS — N2581 Secondary hyperparathyroidism of renal origin: Secondary | ICD-10-CM | POA: Diagnosis not present

## 2019-04-14 DIAGNOSIS — D509 Iron deficiency anemia, unspecified: Secondary | ICD-10-CM | POA: Diagnosis not present

## 2019-04-15 DIAGNOSIS — N186 End stage renal disease: Secondary | ICD-10-CM | POA: Diagnosis not present

## 2019-04-15 DIAGNOSIS — I871 Compression of vein: Secondary | ICD-10-CM | POA: Diagnosis not present

## 2019-04-15 DIAGNOSIS — Z992 Dependence on renal dialysis: Secondary | ICD-10-CM | POA: Diagnosis not present

## 2019-04-15 DIAGNOSIS — T82858A Stenosis of vascular prosthetic devices, implants and grafts, initial encounter: Secondary | ICD-10-CM | POA: Diagnosis not present

## 2019-04-16 ENCOUNTER — Telehealth: Payer: Self-pay | Admitting: Internal Medicine

## 2019-04-16 DIAGNOSIS — N186 End stage renal disease: Secondary | ICD-10-CM | POA: Diagnosis not present

## 2019-04-16 DIAGNOSIS — E1129 Type 2 diabetes mellitus with other diabetic kidney complication: Secondary | ICD-10-CM | POA: Diagnosis not present

## 2019-04-16 DIAGNOSIS — Z992 Dependence on renal dialysis: Secondary | ICD-10-CM | POA: Diagnosis not present

## 2019-04-16 DIAGNOSIS — Z1159 Encounter for screening for other viral diseases: Secondary | ICD-10-CM

## 2019-04-16 DIAGNOSIS — Z23 Encounter for immunization: Secondary | ICD-10-CM | POA: Diagnosis not present

## 2019-04-16 DIAGNOSIS — D509 Iron deficiency anemia, unspecified: Secondary | ICD-10-CM | POA: Diagnosis not present

## 2019-04-16 DIAGNOSIS — N2581 Secondary hyperparathyroidism of renal origin: Secondary | ICD-10-CM | POA: Diagnosis not present

## 2019-04-16 NOTE — Telephone Encounter (Signed)
Copied from [place] [id number]. Topic: Appointment Scheduling - Scheduling Inquiry for Clinic >> Apr 16, 2019  8:34 AM Shane [name] wrote: Patient states he will be going to [place] for dialysis the last week of September. Patient inquired about covid-19 testing before leaving [place]. Patient would like to know if an appointment with Dr. [name] is necessary to discuss.

## 2019-04-16 NOTE — Telephone Encounter (Signed)
No appointment is needed unless he has concerning symptoms.  If he just wants to be tested we can order the test.

## 2019-04-16 NOTE — Telephone Encounter (Signed)
Pt aware of response. Wants to have orders put in. No sxs.

## 2019-04-17 NOTE — Telephone Encounter (Signed)
ordered

## 2019-04-18 NOTE — Progress Notes (Signed)
Chart and office note reviewed in detail  > agree with a/p as outlined    

## 2019-04-19 ENCOUNTER — Telehealth: Payer: Self-pay | Admitting: Pulmonary Disease

## 2019-04-19 DIAGNOSIS — Z992 Dependence on renal dialysis: Secondary | ICD-10-CM | POA: Diagnosis not present

## 2019-04-19 DIAGNOSIS — N186 End stage renal disease: Secondary | ICD-10-CM | POA: Diagnosis not present

## 2019-04-19 DIAGNOSIS — Z23 Encounter for immunization: Secondary | ICD-10-CM | POA: Diagnosis not present

## 2019-04-19 DIAGNOSIS — D509 Iron deficiency anemia, unspecified: Secondary | ICD-10-CM | POA: Diagnosis not present

## 2019-04-19 DIAGNOSIS — E1129 Type 2 diabetes mellitus with other diabetic kidney complication: Secondary | ICD-10-CM | POA: Diagnosis not present

## 2019-04-19 DIAGNOSIS — N2581 Secondary hyperparathyroidism of renal origin: Secondary | ICD-10-CM | POA: Diagnosis not present

## 2019-04-19 NOTE — Telephone Encounter (Signed)
Xolair Prefilled Syringe Order: 150mg  Prefilled Syringe:  #4 75mg  Prefilled Syringe: N/A Ordered Date: 04/19/2019 Expected date of arrival: 04/20/2019 Ordered by: Desmond Dike, [place]  Specialty [facility]: [facility]

## 2019-04-19 NOTE — Telephone Encounter (Signed)
LVM letting pt know.  

## 2019-04-20 NOTE — Telephone Encounter (Signed)
Xolair Prefilled Syringe Received:  150mg  Prefilled Syringe >> quantity #4, lot # [id number], exp date 01/04/2020 75mg  Prefilled Syringe >>N/A Medication arrival date: 04/20/19 Received by: Medardo Hassing,LPN

## 2019-04-21 DIAGNOSIS — N2581 Secondary hyperparathyroidism of renal origin: Secondary | ICD-10-CM | POA: Diagnosis not present

## 2019-04-21 DIAGNOSIS — Z992 Dependence on renal dialysis: Secondary | ICD-10-CM | POA: Diagnosis not present

## 2019-04-21 DIAGNOSIS — D509 Iron deficiency anemia, unspecified: Secondary | ICD-10-CM | POA: Diagnosis not present

## 2019-04-21 DIAGNOSIS — E1129 Type 2 diabetes mellitus with other diabetic kidney complication: Secondary | ICD-10-CM | POA: Diagnosis not present

## 2019-04-21 DIAGNOSIS — N186 End stage renal disease: Secondary | ICD-10-CM | POA: Diagnosis not present

## 2019-04-21 DIAGNOSIS — Z23 Encounter for immunization: Secondary | ICD-10-CM | POA: Diagnosis not present

## 2019-04-23 DIAGNOSIS — E1129 Type 2 diabetes mellitus with other diabetic kidney complication: Secondary | ICD-10-CM | POA: Diagnosis not present

## 2019-04-23 DIAGNOSIS — N186 End stage renal disease: Secondary | ICD-10-CM | POA: Diagnosis not present

## 2019-04-23 DIAGNOSIS — Z23 Encounter for immunization: Secondary | ICD-10-CM | POA: Diagnosis not present

## 2019-04-23 DIAGNOSIS — Z992 Dependence on renal dialysis: Secondary | ICD-10-CM | POA: Diagnosis not present

## 2019-04-23 DIAGNOSIS — D509 Iron deficiency anemia, unspecified: Secondary | ICD-10-CM | POA: Diagnosis not present

## 2019-04-23 DIAGNOSIS — N2581 Secondary hyperparathyroidism of renal origin: Secondary | ICD-10-CM | POA: Diagnosis not present

## 2019-04-26 ENCOUNTER — Other Ambulatory Visit: Payer: Self-pay | Admitting: *Deleted

## 2019-04-26 DIAGNOSIS — N2581 Secondary hyperparathyroidism of renal origin: Secondary | ICD-10-CM | POA: Diagnosis not present

## 2019-04-26 DIAGNOSIS — Z20822 Contact with and (suspected) exposure to covid-19: Secondary | ICD-10-CM

## 2019-04-26 DIAGNOSIS — D509 Iron deficiency anemia, unspecified: Secondary | ICD-10-CM | POA: Diagnosis not present

## 2019-04-26 DIAGNOSIS — Z992 Dependence on renal dialysis: Secondary | ICD-10-CM | POA: Diagnosis not present

## 2019-04-26 DIAGNOSIS — E1129 Type 2 diabetes mellitus with other diabetic kidney complication: Secondary | ICD-10-CM | POA: Diagnosis not present

## 2019-04-26 DIAGNOSIS — R6889 Other general symptoms and signs: Secondary | ICD-10-CM | POA: Diagnosis not present

## 2019-04-26 DIAGNOSIS — Z23 Encounter for immunization: Secondary | ICD-10-CM | POA: Diagnosis not present

## 2019-04-26 DIAGNOSIS — N186 End stage renal disease: Secondary | ICD-10-CM | POA: Diagnosis not present

## 2019-04-27 LAB — NOVEL CORONAVIRUS, NAA: SARS-CoV-2, NAA: NOT DETECTED

## 2019-04-28 ENCOUNTER — Telehealth: Payer: Self-pay | Admitting: *Deleted

## 2019-04-28 DIAGNOSIS — N186 End stage renal disease: Secondary | ICD-10-CM | POA: Diagnosis not present

## 2019-04-28 DIAGNOSIS — Z992 Dependence on renal dialysis: Secondary | ICD-10-CM | POA: Diagnosis not present

## 2019-04-28 DIAGNOSIS — Z23 Encounter for immunization: Secondary | ICD-10-CM | POA: Diagnosis not present

## 2019-04-28 DIAGNOSIS — N2581 Secondary hyperparathyroidism of renal origin: Secondary | ICD-10-CM | POA: Diagnosis not present

## 2019-04-28 DIAGNOSIS — E1129 Type 2 diabetes mellitus with other diabetic kidney complication: Secondary | ICD-10-CM | POA: Diagnosis not present

## 2019-04-28 DIAGNOSIS — D509 Iron deficiency anemia, unspecified: Secondary | ICD-10-CM | POA: Diagnosis not present

## 2019-04-28 NOTE — Telephone Encounter (Signed)
Patient informed of negative COVID result. Patient had to have test due to travel and treatment. Patient notified if office has Epic or uses [facility] they can access result- if needed contact PCP and they can fax copy. Patient is thankful and will reach out if needed.

## 2019-04-29 ENCOUNTER — Other Ambulatory Visit: Payer: Self-pay

## 2019-04-29 ENCOUNTER — Ambulatory Visit: Payer: Medicare Other

## 2019-04-29 ENCOUNTER — Encounter: Payer: Self-pay | Admitting: Internal Medicine

## 2019-04-29 ENCOUNTER — Ambulatory Visit (INDEPENDENT_AMBULATORY_CARE_PROVIDER_SITE_OTHER): Payer: Medicare Other | Admitting: Internal Medicine

## 2019-04-29 DIAGNOSIS — J4540 Moderate persistent asthma, uncomplicated: Secondary | ICD-10-CM | POA: Diagnosis not present

## 2019-04-29 DIAGNOSIS — I255 Ischemic cardiomyopathy: Secondary | ICD-10-CM

## 2019-04-29 MED ORDER — OMALIZUMAB 150 MG/ML ~~LOC~~ SOSY
300.0000 mg | PREFILLED_SYRINGE | Freq: Once | SUBCUTANEOUS | Status: AC
  Administered 2019-04-29: 300 mg via SUBCUTANEOUS

## 2019-04-29 NOTE — Progress Notes (Signed)
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  Dyspnea:  Uses rollator due to balance, not breathing  Cough:better  Sleep: 02 2lpm, can't tol cpap SABA use: rarely needs  No obvious day to day or daytime variability or assoc excess/ purulent sputum or mucus plugs or hemoptysis or cp or chest tightness, subjective wheeze or overt sinus or hb symptoms.   sleeping ok  without nocturnal  or early am exacerbation  of respiratory  c/o's or need for noct saba. Also denies any obvious fluctuation of symptoms with weather or environmental changes or other aggravating or alleviating factors except as outlined above   No unusual exposure hx or h/o childhood pna/ asthma or knowledge of premature birth.  Current Allergies, Complete Past Medical History, Past Surgical History, Family History, and Social History were reviewed in [employer] record.  ROS  The following are not active complaints unless bolded Hoarseness, sore throat, dysphagia, dental problems, itching, sneezing,  nasal congestion or discharge of excess mucus or purulent secretions, ear ache,   fever, chills, sweats, unintended wt loss or wt gain, classically pleuritic or exertional cp,  orthopnea pnd or arm/hand swelling  or leg swelling, presyncope, palpitations, abdominal pain, anorexia, nausea, vomiting, diarrhea  or change in bowel habits or change in bladder habits, change in stools or change in urine, dysuria, hematuria,  rash, arthralgias, visual complaints, headache,  numbness, weakness or ataxia or problems with walking or coordination,  change in mood or  memory.             Past Medical History:  Diagnosis Date   Abnormal nuclear cardiac imaging test Nov 2010   moderate area of infarct in the inferior wall with only minimal reversibility and EF of 28%   AICD (automatic cardioverter/defibrillator) present    Allergic rhinitis 01-08-13   Uses nebulizer for chronic sinus issues and Mucinex.   Arthritis    osteoarthritis    Asthma    Extrinic   Blood transfusion    ? at time of bypass surgery    BPH (benign prostatic hyperplasia)    Cellulitis of arm, left    MSSA   CHF (congestive heart failure) ([place])    Colon polyps    adenomatous   Coronary artery disease    remote CABG in 1985, cath in 2003 by Dr. Lia Foyer with no PCI, last nuclear in 2010 showing scar and EF of 28%.    Diabetes mellitus    Diabetic neuropathy ([place]) 04/25/2017   Dyslipidemia    ESRD (end stage renal disease) ([place])    Gait abnormality 04/25/2017   Glaucoma 01-08-13   tx. eye drops   Hemodialysis patient ([place])    [place] (hard of hearing)    HTN (hypertension)    Hypercholesterolemia    Left ventricular dysfunction    28% per nuclear in 2010 and 35 to 40% per echo in 2010   Myocardial infarction [facility])    1985  Neuromuscular disorder (HCC)    legs mild paralysis-able to walk"nerve damage"-legs- left leg brace    Neuropathy    Pneumonia    hx of several times years ago    PVD (peripheral vascular disease) ([place])    a. s/p PTA/stenting of L CIA 02/2019.   Renal artery stenosis (HCC)    a. moderate bilateral by angio 02/2019.   Spinal stenosis     Outpatient Medications Prior to Visit  Medication Sig Dispense Refill   albuterol (PROVENTIL) (2.5 MG/3ML) 0.083% nebulizer solution Take 3 mLs (2.5 mg total) by nebulization every 6 (six) hours as needed for wheezing or shortness of breath. 120 mL 3   Albuterol Sulfate (PROAIR RESPICLICK) 599 (90  Base) MCG/ACT AEPB Inhale 1-2 puffs into the lungs every 6 (six) hours as needed. (Patient taking differently: Inhale 1-2 puffs into the lungs every 6 (six) hours as needed (shortness of breath). ) 1 each 5   allopurinol (ZYLOPRIM) 100 MG tablet TAKE 1 TABLET DAILY 90 tablet 1   amiodarone (PACERONE) 200 MG tablet Take 1 tablet (200 mg total) by mouth daily. 90 tablet 2   aspirin EC 81 MG tablet Take 81 mg by mouth daily.      atorvastatin (LIPITOR) 80 MG tablet Take 1 tablet (80 mg total) by mouth every evening. Please make overdue appt with Dr. Acie Fredrickson before anymore refills. 1st attempt 90 tablet 0   calcitRIOL (ROCALTROL) 0.25 MCG capsule Take 1 capsule (0.25 mcg total) by mouth every Monday, Wednesday, and Friday with hemodialysis. 60 capsule 0   calcium carbonate (TUMS) 500 MG chewable tablet Chew 1 tablet by mouth 2 (two) times daily as needed for indigestion or heartburn.     cetirizine (ZYRTEC) 10 MG tablet Take 10 mg by mouth at bedtime as needed (seasonal allergies).      clopidogrel (PLAVIX) 75 MG tablet Take 1 tablet (75 mg total) by mouth daily. 90 tablet 3   clotrimazole-betamethasone (LOTRISONE) lotion Apply to left foot twice daily 30 mL 1   dicyclomine (BENTYL) 10 MG capsule Take 10 mg by mouth 3 (three) times daily as needed for spasms.   3   fluticasone furoate-vilanterol (BREO ELLIPTA) 200-25 MCG/INH AEPB Inhale 1 puff into the lungs daily. 180 each 2   glipiZIDE (GLUCOTROL XL) 5 MG 24 hr tablet Take 5 mg by mouth every evening.   6   multivitamin (RENA-VIT) TABS tablet Take 1 tablet by mouth at bedtime. 60 tablet 0   Omalizumab (XOLAIR [place]) Inject into the skin every 21 ( twenty-one) days.     omeprazole (PRILOSEC) 20 MG capsule Take 1 capsule (20 mg total) by mouth daily. 30 capsule 3   OXYGEN Inhale 2 L into the lungs See admin instructions. Inhale 2 L into lungs during dialysis on Monday, Wednesday and Friday and every night at bedtime     polyethylene glycol  (MIRALAX / GLYCOLAX) packet Take 17 g by mouth daily as needed for mild constipation. 14 each 0   sodium chloride (OCEAN) 0.65 % SOLN nasal spray Place 1 spray into both nostrils as needed for congestion.     Syringe/Needle, Disp, (SYRINGE 3CC/22GX1") 22G X 1" 3 ML MISC Use to give injection     tadalafil (CIALIS) 20 MG tablet Take 20 mg by mouth daily as needed for erectile dysfunction. Do not exceed 2 doses in 7 days     testosterone cypionate (DEPOTESTOTERONE CYPIONATE) 100 MG/ML injection Inject 400 mg into the muscle every 21 (  twenty-one) days. For IM use only     zolpidem (AMBIEN) 10 MG tablet TAKE 1/2 (ONE-HALF) TABLET BY MOUTH AT BEDTIME TAKE  AN  ADDITIONAL  1/2  TABLET  IF  NEEDED  FOR  SLEEP 30 tablet 2   calcium acetate (PHOSLO) 667 MG capsule Take 667 mg by mouth 3 (three) times daily with meals.      triamcinolone (NASACORT AQ) 55 MCG/ACT AERO nasal inhaler Place 2 sprays into the nose daily as needed (seasonal allergies).      No facility-administered medications prior to visit.      Objective:     BP 124/74 (BP Location: Left Arm, Cuff Size: Normal)    Pulse 90    Temp 98.1 F (36.7 C) (Oral)    Ht '5\' 9"'  (1.753 m)    Wt 203 lb (92.1 kg)    SpO2 97% Comment: on RA   BMI 29.98 kg/m   SpO2: 97 %(on RA)   amb wm nad   HEENT : pt wearing mask not removed for exam due to covid -19 concerns.    NECK :  without JVD/Nodes/TM/ nl carotid upstrokes bilaterally   LUNGS: no acc muscle use,  Nl contour chest which is clear to A and P bilaterally without cough on insp or exp maneuvers   CV:  RRR  no s3 or murmur or increase in P2, and no edema   ABD:  soft and nontender with nl inspiratory excursion in the supine position. No bruits or organomegaly appreciated, bowel sounds nl  MS:  Nl gait/ ext warm without deformities, calf tenderness, cyanosis or clubbing No obvious joint restrictions   SKIN: warm and dry without lesions    NEURO:  alert, approp, nl sensorium  with  no motor or cerebellar deficits apparent.    I personally reviewed images and agree with radiology impression as follows:  CXR:   01/08/2019 1. No radiographic evidence of acute cardiopulmonary disease. 2. Aortic atherosclerosis.      Assessment   Asthma, moderate persistent Onset allergies in childood, asthma aournd 2005 and placed on xoair 2015 Pulmonary functions on 07/07/2013: FEV1 98% predicted FVC 89% predicted FEV1 to FVC ratio 79% predicted total lung capacity 83% predicted diffusion capacity 64% predicted airway resistance 163% predicted IgE level greater than 200 07/15/13 CT Sinus : sinusitis;   Ct Chest : normal  All goals of chronic asthma control met including optimal function and elimination of symptoms with minimal need for rescue therapy.  Contingencies discussed in full including contacting this office immediately if not controlling the symptoms using the rule of two's.     Main concern is high dose ICS DPI / saba dpi in event cough becomes problematic > would not hesitate to change over to [facility] but not needed for now  >>>> ok to continue xolair injections q 3 weeks for now.    Total time devoted to counseling  > 50 % of  40 min office visit with pt new to me:   reviewed case with pt/ discussion of options/alternatives/ personally creating written customized instructions  in presence of pt  then going over those specific  Instructions directly with the pt including how to use all of the meds but in particular covering each new medication in detail and the difference between the maintenance= "automatic" meds and the prns using an action plan format for the latter (If this problem/symptom => do that organization reading Left to right).  Please see AVS from this visit  for a full list of these instructions which I personally wrote for this pt and  are unique to this visit.      Christinia Gully, MD 04/29/2019

## 2019-04-29 NOTE — Addendum Note (Signed)
Addended by: Elton Sin on: 04/29/2019 11:37 AM   Modules accepted: Orders

## 2019-04-29 NOTE — Patient Instructions (Signed)
No change in medications needed unless cough becomes more problematic in the future   Please schedule a follow up visit in 6  months but call sooner if needed

## 2019-04-29 NOTE — Assessment & Plan Note (Addendum)
Onset allergies in childood, asthma aournd 2005 and placed on xoair 2015 Pulmonary functions on 07/07/2013: FEV1 98% predicted FVC 89% predicted FEV1 to FVC ratio 79% predicted total lung capacity 83% predicted diffusion capacity 64% predicted airway resistance 163% predicted  07/07/2013 - Allergy profile - 0.28 ragweed, ige 209 07/15/13 CT Sinus : sinusitis   Ct Chest : normal  All goals of chronic asthma control met including optimal function and elimination of symptoms with minimal need for rescue therapy.  Contingencies discussed in full including contacting this office immediately if not controlling the symptoms using the rule of two's.     Main concern is high dose ICS DPI / saba dpi in event cough becomes problematic > would not hesitate to change over to [facility] but not needed for now  >>>> ok to continue xolair injections q 3 weeks for now.    Total time devoted to counseling  > 50 % of  40 min office visit with pt new to me:   reviewed case with pt/ discussion of options/alternatives/ personally creating written customized instructions  in presence of pt  then going over those specific  Instructions directly with the pt including how to use all of the meds but in particular covering each new medication in detail and the difference between the maintenance= "automatic" meds and the prns using an action plan format for the latter (If this problem/symptom => do that organization reading Left to right).  Please see AVS from this visit for a full list of these instructions which I personally wrote for this pt and  are unique to this visit.

## 2019-04-29 NOTE — Progress Notes (Signed)
Have you been hospitalized within the last 10 days?  No Do you have a fever?  No Do you have a cough?  No Do you have a headache or sore throat? No  

## 2019-04-30 DIAGNOSIS — Z992 Dependence on renal dialysis: Secondary | ICD-10-CM | POA: Diagnosis not present

## 2019-04-30 DIAGNOSIS — D509 Iron deficiency anemia, unspecified: Secondary | ICD-10-CM | POA: Diagnosis not present

## 2019-04-30 DIAGNOSIS — N2581 Secondary hyperparathyroidism of renal origin: Secondary | ICD-10-CM | POA: Diagnosis not present

## 2019-04-30 DIAGNOSIS — Z23 Encounter for immunization: Secondary | ICD-10-CM | POA: Diagnosis not present

## 2019-04-30 DIAGNOSIS — E1129 Type 2 diabetes mellitus with other diabetic kidney complication: Secondary | ICD-10-CM | POA: Diagnosis not present

## 2019-04-30 DIAGNOSIS — N186 End stage renal disease: Secondary | ICD-10-CM | POA: Diagnosis not present

## 2019-05-03 DIAGNOSIS — Z992 Dependence on renal dialysis: Secondary | ICD-10-CM | POA: Diagnosis not present

## 2019-05-03 DIAGNOSIS — D631 Anemia in chronic kidney disease: Secondary | ICD-10-CM | POA: Diagnosis not present

## 2019-05-03 DIAGNOSIS — N186 End stage renal disease: Secondary | ICD-10-CM | POA: Diagnosis not present

## 2019-05-03 DIAGNOSIS — N2581 Secondary hyperparathyroidism of renal origin: Secondary | ICD-10-CM | POA: Diagnosis not present

## 2019-05-05 DIAGNOSIS — Z992 Dependence on renal dialysis: Secondary | ICD-10-CM | POA: Diagnosis not present

## 2019-05-05 DIAGNOSIS — D631 Anemia in chronic kidney disease: Secondary | ICD-10-CM | POA: Diagnosis not present

## 2019-05-05 DIAGNOSIS — N2581 Secondary hyperparathyroidism of renal origin: Secondary | ICD-10-CM | POA: Diagnosis not present

## 2019-05-05 DIAGNOSIS — N186 End stage renal disease: Secondary | ICD-10-CM | POA: Diagnosis not present

## 2019-05-06 DIAGNOSIS — M25512 Pain in left shoulder: Secondary | ICD-10-CM | POA: Diagnosis not present

## 2019-05-06 DIAGNOSIS — S4992XA Unspecified injury of left shoulder and upper arm, initial encounter: Secondary | ICD-10-CM | POA: Diagnosis not present

## 2019-05-06 DIAGNOSIS — N186 End stage renal disease: Secondary | ICD-10-CM | POA: Diagnosis not present

## 2019-05-06 DIAGNOSIS — Z992 Dependence on renal dialysis: Secondary | ICD-10-CM | POA: Diagnosis not present

## 2019-05-06 DIAGNOSIS — I129 Hypertensive chronic kidney disease with stage 1 through stage 4 chronic kidney disease, or unspecified chronic kidney disease: Secondary | ICD-10-CM | POA: Diagnosis not present

## 2019-05-06 DIAGNOSIS — W1830XA Fall on same level, unspecified, initial encounter: Secondary | ICD-10-CM | POA: Diagnosis not present

## 2019-05-07 DIAGNOSIS — Z992 Dependence on renal dialysis: Secondary | ICD-10-CM | POA: Diagnosis not present

## 2019-05-07 DIAGNOSIS — N2581 Secondary hyperparathyroidism of renal origin: Secondary | ICD-10-CM | POA: Diagnosis not present

## 2019-05-07 DIAGNOSIS — N186 End stage renal disease: Secondary | ICD-10-CM | POA: Diagnosis not present

## 2019-05-10 ENCOUNTER — Other Ambulatory Visit: Payer: Self-pay | Admitting: Internal Medicine

## 2019-05-10 DIAGNOSIS — F5101 Primary insomnia: Secondary | ICD-10-CM

## 2019-05-10 DIAGNOSIS — Z992 Dependence on renal dialysis: Secondary | ICD-10-CM | POA: Diagnosis not present

## 2019-05-10 DIAGNOSIS — D631 Anemia in chronic kidney disease: Secondary | ICD-10-CM | POA: Diagnosis not present

## 2019-05-10 DIAGNOSIS — N186 End stage renal disease: Secondary | ICD-10-CM | POA: Diagnosis not present

## 2019-05-10 DIAGNOSIS — E1129 Type 2 diabetes mellitus with other diabetic kidney complication: Secondary | ICD-10-CM | POA: Diagnosis not present

## 2019-05-10 DIAGNOSIS — D509 Iron deficiency anemia, unspecified: Secondary | ICD-10-CM | POA: Diagnosis not present

## 2019-05-10 DIAGNOSIS — N2581 Secondary hyperparathyroidism of renal origin: Secondary | ICD-10-CM | POA: Diagnosis not present

## 2019-05-11 ENCOUNTER — Other Ambulatory Visit: Payer: Self-pay | Admitting: Internal Medicine

## 2019-05-11 DIAGNOSIS — F5101 Primary insomnia: Secondary | ICD-10-CM

## 2019-05-12 ENCOUNTER — Other Ambulatory Visit: Payer: Self-pay | Admitting: Internal Medicine

## 2019-05-12 ENCOUNTER — Other Ambulatory Visit: Payer: Self-pay

## 2019-05-12 ENCOUNTER — Ambulatory Visit (INDEPENDENT_AMBULATORY_CARE_PROVIDER_SITE_OTHER): Payer: Medicare Other | Admitting: Podiatry

## 2019-05-12 ENCOUNTER — Encounter: Payer: Self-pay | Admitting: Podiatry

## 2019-05-12 DIAGNOSIS — N186 End stage renal disease: Secondary | ICD-10-CM | POA: Diagnosis not present

## 2019-05-12 DIAGNOSIS — M79675 Pain in left toe(s): Secondary | ICD-10-CM | POA: Diagnosis not present

## 2019-05-12 DIAGNOSIS — E1151 Type 2 diabetes mellitus with diabetic peripheral angiopathy without gangrene: Secondary | ICD-10-CM

## 2019-05-12 DIAGNOSIS — B351 Tinea unguium: Secondary | ICD-10-CM

## 2019-05-12 DIAGNOSIS — D631 Anemia in chronic kidney disease: Secondary | ICD-10-CM | POA: Diagnosis not present

## 2019-05-12 DIAGNOSIS — D509 Iron deficiency anemia, unspecified: Secondary | ICD-10-CM | POA: Diagnosis not present

## 2019-05-12 DIAGNOSIS — N2581 Secondary hyperparathyroidism of renal origin: Secondary | ICD-10-CM | POA: Diagnosis not present

## 2019-05-12 DIAGNOSIS — F5101 Primary insomnia: Secondary | ICD-10-CM

## 2019-05-12 DIAGNOSIS — E1129 Type 2 diabetes mellitus with other diabetic kidney complication: Secondary | ICD-10-CM | POA: Diagnosis not present

## 2019-05-12 DIAGNOSIS — Z992 Dependence on renal dialysis: Secondary | ICD-10-CM | POA: Diagnosis not present

## 2019-05-12 NOTE — Patient Instructions (Signed)

## 2019-05-13 NOTE — Telephone Encounter (Signed)
[place] Controlled Database Checked Last filled: 04/09/19 # 30 LOV w/you: 02/24/19 Next appt w/you: none

## 2019-05-14 DIAGNOSIS — D509 Iron deficiency anemia, unspecified: Secondary | ICD-10-CM | POA: Diagnosis not present

## 2019-05-14 DIAGNOSIS — E291 Testicular hypofunction: Secondary | ICD-10-CM | POA: Diagnosis not present

## 2019-05-14 DIAGNOSIS — D631 Anemia in chronic kidney disease: Secondary | ICD-10-CM | POA: Diagnosis not present

## 2019-05-14 DIAGNOSIS — Z992 Dependence on renal dialysis: Secondary | ICD-10-CM | POA: Diagnosis not present

## 2019-05-14 DIAGNOSIS — N186 End stage renal disease: Secondary | ICD-10-CM | POA: Diagnosis not present

## 2019-05-14 DIAGNOSIS — E1129 Type 2 diabetes mellitus with other diabetic kidney complication: Secondary | ICD-10-CM | POA: Diagnosis not present

## 2019-05-14 DIAGNOSIS — N2581 Secondary hyperparathyroidism of renal origin: Secondary | ICD-10-CM | POA: Diagnosis not present

## 2019-05-16 NOTE — Progress Notes (Signed)
Subjective:  Shane [name] presents to clinic today with cc of  painful, thick, discolored, elongated toenails 1-5 b/l that become tender and cannot cut because of thickness. Pain is aggravated when wearing enclosed shoe gear.  [name], MD is his PCP.  He has had dialysis treatment on today. His dialysis days are MWF. He voices no new pedal problems on today's visit.  Current Outpatient Medications on File Prior to Visit  Medication Sig Dispense Refill  . albuterol (PROVENTIL) (2.5 MG/3ML) 0.083% nebulizer solution Take 3 mLs (2.5 mg total) by nebulization every 6 (six) hours as needed for wheezing or shortness of breath. 120 mL 3  . Albuterol Sulfate (PROAIR RESPICLICK) 093 (90 Base) MCG/ACT AEPB Inhale 1-2 puffs into the lungs every 6 (six) hours as needed. (Patient taking differently: Inhale 1-2 puffs into the lungs every 6 (six) hours as needed (shortness of breath). ) 1 each 5  . allopurinol (ZYLOPRIM) 100 MG tablet TAKE 1 TABLET DAILY 90 tablet 1  . amiodarone (PACERONE) 200 MG tablet TAKE 1 TABLET DAILY 90 tablet 2  . aspirin EC 81 MG tablet Take 81 mg by mouth daily.     Marland Kitchen atorvastatin (LIPITOR) 80 MG tablet Take 1 tablet (80 mg total) by mouth every evening. Please make overdue appt with Dr. Acie Fredrickson before anymore refills. 1st attempt 90 tablet 0  . calcitRIOL (ROCALTROL) 0.25 MCG capsule Take 1 capsule (0.25 mcg total) by mouth every Monday, Wednesday, and Friday with hemodialysis. 60 capsule 0  . calcium carbonate (TUMS) 500 MG chewable tablet Chew 1 tablet by mouth 2 (two) times daily as needed for indigestion or heartburn.    . cetirizine (ZYRTEC) 10 MG tablet Take 10 mg by mouth at bedtime as needed (seasonal allergies).     . clopidogrel (PLAVIX) 75 MG tablet Take 1 tablet (75 mg total) by mouth daily. 90 tablet 3  . clotrimazole-betamethasone (LOTRISONE) lotion Apply to left foot twice daily 30 mL 1  . dicyclomine (BENTYL) 10 MG capsule Take 10 mg by mouth 3 (three)  times daily as needed for spasms.   3  . fluticasone furoate-vilanterol (BREO ELLIPTA) 200-25 MCG/INH AEPB Inhale 1 puff into the lungs daily. 180 each 2  . glipiZIDE (GLUCOTROL XL) 5 MG 24 hr tablet Take 5 mg by mouth every evening.   6  . HYDROcodone-acetaminophen (NORCO/VICODIN) 5-325 MG tablet     . multivitamin (RENA-VIT) TABS tablet Take 1 tablet by mouth at bedtime. 60 tablet 0  . Omalizumab (XOLAIR [place]) Inject into the skin every 21 ( twenty-one) days.    Marland Kitchen omeprazole (PRILOSEC) 20 MG capsule Take 1 capsule (20 mg total) by mouth daily. 30 capsule 3  . OXYGEN Inhale 2 L into the lungs See admin instructions. Inhale 2 L into lungs during dialysis on Monday, Wednesday and Friday and every night at bedtime    . polyethylene glycol (MIRALAX / GLYCOLAX) packet Take 17 g by mouth daily as needed for mild constipation. 14 each 0  . predniSONE (DELTASONE) 20 MG tablet     . sodium chloride (OCEAN) 0.65 % SOLN nasal spray Place 1 spray into both nostrils as needed for congestion.    . Syringe/Needle, Disp, (SYRINGE 3CC/22GX1") 22G X 1" 3 ML MISC Use to give injection    . tadalafil (CIALIS) 20 MG tablet Take 20 mg by mouth daily as needed for erectile dysfunction. Do not exceed 2 doses in 7 days    . testosterone cypionate (DEPOTESTOTERONE CYPIONATE)  100 MG/ML injection Inject 400 mg into the muscle every 21 ( twenty-one) days. For IM use only     No current facility-administered medications on file prior to visit.      Allergies  Allergen Reactions  . Codeine Other (See Comments)    anxiety  . Dacarbazine Other (See Comments)  . Hydrocodone Other (See Comments)    Anxiety- can take in liquid form  . Pseudoephedrine Other (See Comments)    Causes heart to race  . Azithromycin Rash     Objective: Physical Examination:  Vascular Examination: Capillary refill time immediate x 10 digits.  DP pulses 1/4 right; 2/4 left.  PT pulses nonpalpable RLE; faintly palpable LLE.  Digital hair  absent b/l.  No edema noted b/l.  Skin temperature gradient WNL b/l.  Dermatological Examination: Skin thin and atrophic b/l.  No open wounds b/l.  No interdigital macerations noted b/l.  Elongated, thick, discolored brittle toenails with subungual debris and pain on dorsal palpation of nailbeds 1-5 b/l.  Musculoskeletal Examination: Muscle strength 5/5 to all muscle groups b/l.  No pain, crepitus or joint discomfort with active/passive ROM.  Neurological Examination: Sensation intact 5/5 b/l with 10 gram monofilament.  Vibratory sensation intact b/l.  Proprioceptive sensation intact b/l.  Assessment: Mycotic nail infection with pain 1-5 b/l NIDDM with PAD  Plan: 1. Continue diabetic foot care principles.  2. Toenails 1-5 b/l were debrided in length and girth without iatrogenic laceration. 2.  Continue soft, supportive shoe gear daily. 3.  Report any pedal injuries to medical professional. 4.  Follow up 9 weeks. 5.  Patient/POA to call should there be a question/concern in there interim.

## 2019-05-17 DIAGNOSIS — E1129 Type 2 diabetes mellitus with other diabetic kidney complication: Secondary | ICD-10-CM | POA: Diagnosis not present

## 2019-05-17 DIAGNOSIS — D631 Anemia in chronic kidney disease: Secondary | ICD-10-CM | POA: Diagnosis not present

## 2019-05-17 DIAGNOSIS — Z992 Dependence on renal dialysis: Secondary | ICD-10-CM | POA: Diagnosis not present

## 2019-05-17 DIAGNOSIS — N186 End stage renal disease: Secondary | ICD-10-CM | POA: Diagnosis not present

## 2019-05-17 DIAGNOSIS — N2581 Secondary hyperparathyroidism of renal origin: Secondary | ICD-10-CM | POA: Diagnosis not present

## 2019-05-17 DIAGNOSIS — D509 Iron deficiency anemia, unspecified: Secondary | ICD-10-CM | POA: Diagnosis not present

## 2019-05-18 DIAGNOSIS — T82858A Stenosis of vascular prosthetic devices, implants and grafts, initial encounter: Secondary | ICD-10-CM | POA: Diagnosis not present

## 2019-05-18 DIAGNOSIS — Z992 Dependence on renal dialysis: Secondary | ICD-10-CM | POA: Diagnosis not present

## 2019-05-18 DIAGNOSIS — N186 End stage renal disease: Secondary | ICD-10-CM | POA: Diagnosis not present

## 2019-05-18 DIAGNOSIS — I871 Compression of vein: Secondary | ICD-10-CM | POA: Diagnosis not present

## 2019-05-19 DIAGNOSIS — E1129 Type 2 diabetes mellitus with other diabetic kidney complication: Secondary | ICD-10-CM | POA: Diagnosis not present

## 2019-05-19 DIAGNOSIS — N186 End stage renal disease: Secondary | ICD-10-CM | POA: Diagnosis not present

## 2019-05-19 DIAGNOSIS — N2581 Secondary hyperparathyroidism of renal origin: Secondary | ICD-10-CM | POA: Diagnosis not present

## 2019-05-19 DIAGNOSIS — D509 Iron deficiency anemia, unspecified: Secondary | ICD-10-CM | POA: Diagnosis not present

## 2019-05-19 DIAGNOSIS — D631 Anemia in chronic kidney disease: Secondary | ICD-10-CM | POA: Diagnosis not present

## 2019-05-19 DIAGNOSIS — Z992 Dependence on renal dialysis: Secondary | ICD-10-CM | POA: Diagnosis not present

## 2019-05-20 ENCOUNTER — Other Ambulatory Visit: Payer: Self-pay

## 2019-05-20 ENCOUNTER — Ambulatory Visit (INDEPENDENT_AMBULATORY_CARE_PROVIDER_SITE_OTHER): Payer: Medicare Other

## 2019-05-20 DIAGNOSIS — J4540 Moderate persistent asthma, uncomplicated: Secondary | ICD-10-CM

## 2019-05-20 MED ORDER — OMALIZUMAB 150 MG/ML ~~LOC~~ SOSY
300.0000 mg | PREFILLED_SYRINGE | Freq: Once | SUBCUTANEOUS | Status: AC
  Administered 2019-05-20: 12:00:00 300 mg via SUBCUTANEOUS

## 2019-05-20 NOTE — Progress Notes (Signed)
Have you been hospitalized within the last 10 days?  No Do you have a fever?  No Do you have a cough?  No Do you have a headache or sore throat? No  

## 2019-05-21 DIAGNOSIS — Z992 Dependence on renal dialysis: Secondary | ICD-10-CM | POA: Diagnosis not present

## 2019-05-21 DIAGNOSIS — D631 Anemia in chronic kidney disease: Secondary | ICD-10-CM | POA: Diagnosis not present

## 2019-05-21 DIAGNOSIS — N2581 Secondary hyperparathyroidism of renal origin: Secondary | ICD-10-CM | POA: Diagnosis not present

## 2019-05-21 DIAGNOSIS — D509 Iron deficiency anemia, unspecified: Secondary | ICD-10-CM | POA: Diagnosis not present

## 2019-05-21 DIAGNOSIS — E1129 Type 2 diabetes mellitus with other diabetic kidney complication: Secondary | ICD-10-CM | POA: Diagnosis not present

## 2019-05-21 DIAGNOSIS — N186 End stage renal disease: Secondary | ICD-10-CM | POA: Diagnosis not present

## 2019-05-24 DIAGNOSIS — N2581 Secondary hyperparathyroidism of renal origin: Secondary | ICD-10-CM | POA: Diagnosis not present

## 2019-05-24 DIAGNOSIS — D631 Anemia in chronic kidney disease: Secondary | ICD-10-CM | POA: Diagnosis not present

## 2019-05-24 DIAGNOSIS — E1129 Type 2 diabetes mellitus with other diabetic kidney complication: Secondary | ICD-10-CM | POA: Diagnosis not present

## 2019-05-24 DIAGNOSIS — N186 End stage renal disease: Secondary | ICD-10-CM | POA: Diagnosis not present

## 2019-05-24 DIAGNOSIS — D509 Iron deficiency anemia, unspecified: Secondary | ICD-10-CM | POA: Diagnosis not present

## 2019-05-24 DIAGNOSIS — Z992 Dependence on renal dialysis: Secondary | ICD-10-CM | POA: Diagnosis not present

## 2019-05-26 DIAGNOSIS — D631 Anemia in chronic kidney disease: Secondary | ICD-10-CM | POA: Diagnosis not present

## 2019-05-26 DIAGNOSIS — E1129 Type 2 diabetes mellitus with other diabetic kidney complication: Secondary | ICD-10-CM | POA: Diagnosis not present

## 2019-05-26 DIAGNOSIS — Z992 Dependence on renal dialysis: Secondary | ICD-10-CM | POA: Diagnosis not present

## 2019-05-26 DIAGNOSIS — N186 End stage renal disease: Secondary | ICD-10-CM | POA: Diagnosis not present

## 2019-05-26 DIAGNOSIS — D509 Iron deficiency anemia, unspecified: Secondary | ICD-10-CM | POA: Diagnosis not present

## 2019-05-26 DIAGNOSIS — N2581 Secondary hyperparathyroidism of renal origin: Secondary | ICD-10-CM | POA: Diagnosis not present

## 2019-05-28 ENCOUNTER — Ambulatory Visit (INDEPENDENT_AMBULATORY_CARE_PROVIDER_SITE_OTHER): Payer: Medicare Other | Admitting: *Deleted

## 2019-05-28 DIAGNOSIS — D631 Anemia in chronic kidney disease: Secondary | ICD-10-CM | POA: Diagnosis not present

## 2019-05-28 DIAGNOSIS — I5042 Chronic combined systolic (congestive) and diastolic (congestive) heart failure: Secondary | ICD-10-CM | POA: Diagnosis not present

## 2019-05-28 DIAGNOSIS — N2581 Secondary hyperparathyroidism of renal origin: Secondary | ICD-10-CM | POA: Diagnosis not present

## 2019-05-28 DIAGNOSIS — E1129 Type 2 diabetes mellitus with other diabetic kidney complication: Secondary | ICD-10-CM | POA: Diagnosis not present

## 2019-05-28 DIAGNOSIS — D509 Iron deficiency anemia, unspecified: Secondary | ICD-10-CM | POA: Diagnosis not present

## 2019-05-28 DIAGNOSIS — Z992 Dependence on renal dialysis: Secondary | ICD-10-CM | POA: Diagnosis not present

## 2019-05-28 DIAGNOSIS — N186 End stage renal disease: Secondary | ICD-10-CM | POA: Diagnosis not present

## 2019-05-28 DIAGNOSIS — I447 Left bundle-branch block, unspecified: Secondary | ICD-10-CM

## 2019-05-29 ENCOUNTER — Encounter (INDEPENDENT_AMBULATORY_CARE_PROVIDER_SITE_OTHER): Payer: Self-pay

## 2019-05-30 ENCOUNTER — Encounter (HOSPITAL_COMMUNITY): Payer: Self-pay

## 2019-05-30 ENCOUNTER — Other Ambulatory Visit: Payer: Self-pay

## 2019-05-30 ENCOUNTER — Emergency Department (HOSPITAL_COMMUNITY)
Admission: EM | Admit: 2019-05-30 | Discharge: 2019-05-30 | Disposition: A | Discharge status: Home / Self Care | Payer: Medicare Other | Attending: Emergency Medicine | Admitting: Emergency Medicine

## 2019-05-30 ENCOUNTER — Emergency Department (HOSPITAL_COMMUNITY): Payer: Medicare Other

## 2019-05-30 DIAGNOSIS — Z7984 Long term (current) use of oral hypoglycemic drugs: Secondary | ICD-10-CM | POA: Insufficient documentation

## 2019-05-30 DIAGNOSIS — I120 Hypertensive chronic kidney disease with stage 5 chronic kidney disease or end stage renal disease: Secondary | ICD-10-CM | POA: Diagnosis not present

## 2019-05-30 DIAGNOSIS — K862 Cyst of pancreas: Secondary | ICD-10-CM

## 2019-05-30 DIAGNOSIS — I5042 Chronic combined systolic (congestive) and diastolic (congestive) heart failure: Secondary | ICD-10-CM | POA: Diagnosis not present

## 2019-05-30 DIAGNOSIS — K5730 Diverticulosis of large intestine without perforation or abscess without bleeding: Secondary | ICD-10-CM | POA: Diagnosis not present

## 2019-05-30 DIAGNOSIS — R1013 Epigastric pain: Secondary | ICD-10-CM | POA: Diagnosis not present

## 2019-05-30 DIAGNOSIS — Z992 Dependence on renal dialysis: Secondary | ICD-10-CM | POA: Insufficient documentation

## 2019-05-30 DIAGNOSIS — E1122 Type 2 diabetes mellitus with diabetic chronic kidney disease: Secondary | ICD-10-CM | POA: Insufficient documentation

## 2019-05-30 DIAGNOSIS — Z87891 Personal history of nicotine dependence: Secondary | ICD-10-CM | POA: Diagnosis not present

## 2019-05-30 DIAGNOSIS — Z7982 Long term (current) use of aspirin: Secondary | ICD-10-CM | POA: Diagnosis not present

## 2019-05-30 DIAGNOSIS — N186 End stage renal disease: Secondary | ICD-10-CM | POA: Diagnosis not present

## 2019-05-30 DIAGNOSIS — I132 Hypertensive heart and chronic kidney disease with heart failure and with stage 5 chronic kidney disease, or end stage renal disease: Secondary | ICD-10-CM | POA: Diagnosis not present

## 2019-05-30 DIAGNOSIS — Z79899 Other long term (current) drug therapy: Secondary | ICD-10-CM | POA: Insufficient documentation

## 2019-05-30 DIAGNOSIS — R1010 Upper abdominal pain, unspecified: Secondary | ICD-10-CM | POA: Insufficient documentation

## 2019-05-30 DIAGNOSIS — D696 Thrombocytopenia, unspecified: Secondary | ICD-10-CM | POA: Diagnosis not present

## 2019-05-30 DIAGNOSIS — Z96651 Presence of right artificial knee joint: Secondary | ICD-10-CM | POA: Diagnosis not present

## 2019-05-30 DIAGNOSIS — E1140 Type 2 diabetes mellitus with diabetic neuropathy, unspecified: Secondary | ICD-10-CM | POA: Insufficient documentation

## 2019-05-30 DIAGNOSIS — Z951 Presence of aortocoronary bypass graft: Secondary | ICD-10-CM | POA: Insufficient documentation

## 2019-05-30 LAB — CUP PACEART REMOTE DEVICE CHECK
Battery Remaining Longevity: 28 mo
Battery Voltage: 2.88 V
Brady Statistic AP VP Percent: 0.08 %
Brady Statistic AP VS Percent: 0.01 %
Brady Statistic AS VP Percent: 96.64 %
Brady Statistic AS VS Percent: 3.27 %
Brady Statistic RA Percent Paced: 0.09 %
Brady Statistic RV Percent Paced: 2.26 %
Date Time Interrogation Session: 20201023041803
HighPow Impedance: 48 Ohm
Implantable Lead Implant Date: 20160308
Implantable Lead Implant Date: 20160308
Implantable Lead Implant Date: 20160308
Implantable Lead Location: 753858
Implantable Lead Location: 753859
Implantable Lead Location: 753860
Implantable Lead Model: 4598
Implantable Lead Model: 5076
Implantable Lead Model: 6935
Implantable Pulse Generator Implant Date: 20160308
Lead Channel Impedance Value: 285 Ohm
Lead Channel Impedance Value: 323 Ohm
Lead Channel Impedance Value: 323 Ohm
Lead Channel Impedance Value: 342 Ohm
Lead Channel Impedance Value: 342 Ohm
Lead Channel Impedance Value: 380 Ohm
Lead Channel Impedance Value: 399 Ohm
Lead Channel Impedance Value: 399 Ohm
Lead Channel Impedance Value: 513 Ohm
Lead Channel Impedance Value: 513 Ohm
Lead Channel Impedance Value: 608 Ohm
Lead Channel Impedance Value: 646 Ohm
Lead Channel Impedance Value: 646 Ohm
Lead Channel Pacing Threshold Amplitude: 0.625 V
Lead Channel Pacing Threshold Amplitude: 1.125 V
Lead Channel Pacing Threshold Amplitude: 1.75 V
Lead Channel Pacing Threshold Pulse Width: 0.4 ms
Lead Channel Pacing Threshold Pulse Width: 0.4 ms
Lead Channel Pacing Threshold Pulse Width: 0.6 ms
Lead Channel Sensing Intrinsic Amplitude: 2.625 mV
Lead Channel Sensing Intrinsic Amplitude: 2.625 mV
Lead Channel Sensing Intrinsic Amplitude: 3.625 mV
Lead Channel Sensing Intrinsic Amplitude: 3.625 mV
Lead Channel Setting Pacing Amplitude: 2 V
Lead Channel Setting Pacing Amplitude: 2.25 V
Lead Channel Setting Pacing Amplitude: 4 V
Lead Channel Setting Pacing Pulse Width: 0.6 ms
Lead Channel Setting Pacing Pulse Width: 0.8 ms
Lead Channel Setting Sensing Sensitivity: 0.3 mV

## 2019-05-30 LAB — COMPREHENSIVE METABOLIC PANEL
ALT: 19 U/L (ref 0–44)
AST: 10 U/L — ABNORMAL LOW (ref 15–41)
Albumin: 3.4 g/dL — ABNORMAL LOW (ref 3.5–5.0)
Alkaline Phosphatase: 69 U/L (ref 38–126)
Anion gap: 15 (ref 5–15)
BUN: 50 mg/dL — ABNORMAL HIGH (ref 8–23)
CO2: 25 mmol/L (ref 22–32)
Calcium: 9.2 mg/dL (ref 8.9–10.3)
Chloride: 97 mmol/L — ABNORMAL LOW (ref 98–111)
Creatinine, Ser: 7.3 mg/dL — ABNORMAL HIGH (ref 0.61–1.24)
GFR calc Af Amer: 7 mL/min — ABNORMAL LOW (ref >60)
GFR calc non Af Amer: 6 mL/min — ABNORMAL LOW (ref >60)
Glucose, Bld: 126 mg/dL — ABNORMAL HIGH (ref 70–99)
Potassium: 3.4 mmol/L — ABNORMAL LOW (ref 3.5–5.1)
Sodium: 137 mmol/L (ref 135–145)
Total Bilirubin: 1.6 mg/dL — ABNORMAL HIGH (ref 0.3–1.2)
Total Protein: 7 g/dL (ref 6.5–8.1)

## 2019-05-30 LAB — CBC
HCT: 32.7 % — ABNORMAL LOW (ref 39.0–52.0)
Hemoglobin: 9.9 g/dL — ABNORMAL LOW (ref 13.0–17.0)
MCH: 30.5 pg (ref 26.0–34.0)
MCHC: 30.3 g/dL (ref 30.0–36.0)
MCV: 100.6 fL — ABNORMAL HIGH (ref 80.0–100.0)
Platelets: 55 10*3/uL — ABNORMAL LOW (ref 150–400)
RBC: 3.25 MIL/uL — ABNORMAL LOW (ref 4.22–5.81)
RDW: 19 % — ABNORMAL HIGH (ref 11.5–15.5)
WBC: 4 10*3/uL (ref 4.0–10.5)
nRBC: 0 % (ref 0.0–0.2)

## 2019-05-30 LAB — LIPASE, BLOOD: Lipase: 64 U/L — ABNORMAL HIGH (ref 11–51)

## 2019-05-30 MED ORDER — SODIUM CHLORIDE (PF) 0.9 % IJ SOLN
INTRAMUSCULAR | Status: AC
  Filled 2019-05-30: qty 50

## 2019-05-30 MED ORDER — SODIUM CHLORIDE 0.9% FLUSH: 3.0000 mL | Freq: Once | INTRAVENOUS | Status: DC

## 2019-05-30 MED ORDER — FAMOTIDINE 20 MG PO TABS
20.0000 mg | ORAL_TABLET | Freq: Once | ORAL | Status: AC
  Administered 2019-05-30: 20 mg via ORAL
  Filled 2019-05-30: qty 1

## 2019-05-30 MED ORDER — ACETAMINOPHEN 500 MG PO TABS
1000.0000 mg | ORAL_TABLET | Freq: Once | ORAL | Status: AC
  Administered 2019-05-30: 1000 mg via ORAL
  Filled 2019-05-30: qty 2

## 2019-05-30 MED ORDER — IOHEXOL 300 MG/ML  SOLN
100.0000 mL | Freq: Once | INTRAMUSCULAR | Status: AC | PRN
  Administered 2019-05-30: 100 mL via INTRAVENOUS

## 2019-05-30 NOTE — Discharge Instructions (Addendum)
It was our pleasure to provide your ER care today - we hope that you feel better.  Continue acid blocker medication. You may take simethicone or bentyl as need for symptom relief.   Your ct scan was read as showing: 1. No acute findings are evident in the abdomen or pelvis. 2. Colonic diverticulosis without evidence of diverticulitis. 3. Trace free abdominal fluid in the pelvis. 4. Mildly enlarged lymph nodes in the left groin near the common femoral artery. Central hypoattenuation may represent necrosis. 5. A 1.6 cm cystic lesion in the tail of the pancreas. Repeat imaging with MR (preferred) or CT could be performed in 2 years if the patient's overall health and preferences make the patient a potential surgical candidate.   Regarding CT findings above, discuss with your primary care doctor, and have them arrange follow up imaging.   From todays lab tests, your platelet count is low (55) - follow up with your doctor in the next 1-2 weeks.   For your GI symptoms for the past couple months, follow up with GI specialist in the next 1-2 weeks - call office Monday to arrange appointment.   Return to ER if worse, new symptoms, fevers, persistent vomiting,  worsening or severe abdominal painpain, trouble breathing, chest pain, or other concern.

## 2019-05-30 NOTE — ED Notes (Signed)
Pt. Stated he normally urinates a little due to going to dialysis. Will collect urine when pt. Voids. Nurse aware.

## 2019-05-30 NOTE — ED Triage Notes (Signed)
Pt states that he is a dialysis pt. Pt stated that he was put on phosphorus binders, and then taken off. Pt states that since then, he has had "stomach issues". Pt states he has taken prilosec without relief. Pt also states he had to sit up to sleep last night due to bad reflux.

## 2019-05-30 NOTE — ED Provider Notes (Signed)
[facility] DEPT Provider Note   CSN: [id number] Arrival date & time: 05/30/19  1331     History   Chief Complaint Chief Complaint  Patient presents with   Abdominal Pain   Nausea    HPI Shane [name] is a 85 y.o. male.     Patient with hx ESRD/HD M/W/F, presents with upper abd pain for past few months. Pain is constant, dull, gradual onset, non radiating, mild to mod, epigastric and upper abd, worse after eating with relatively early satiety. States initial improvement with maalox, but his renal docs told him that wasn't best med for him, to stop that, and try simethicone and prilosec. Pt notes some improvement, but not resolution, with those meds.  Occasional reflux. Denies chest pain or any exertional cp. No unusual doe. No associated nvd. No fever or chills. No recent wt loss. Having normal bms. No gu c/o. Denies hx prior endoscopy. No hx pud, gallstones, or pancreatitis.   The history is provided by the patient and the spouse.  Abdominal Pain Associated symptoms: no chest pain, no cough, no fever, no shortness of breath, no sore throat and no vomiting     Past Medical History:  Diagnosis Date   Abnormal nuclear cardiac imaging test Nov 2010   moderate area of infarct in the inferior wall with only minimal reversibility and EF of 28%   AICD (automatic cardioverter/defibrillator) present    Allergic rhinitis 01-08-13   Uses nebulizer for chronic sinus issues and Mucinex.   Arthritis    osteoarthritis    Asthma    Extrinic   Blood transfusion    ? at time of bypass surgery    BPH (benign prostatic hyperplasia)    Cellulitis of arm, left    MSSA   CHF (congestive heart failure) ([place])    Colon polyps    adenomatous   Coronary artery disease    remote CABG in 1985, cath in 2003 by Dr. Lia Foyer with no PCI, last nuclear in 2010 showing scar and EF of 28%.    Diabetes mellitus    Diabetic neuropathy ([place]) 04/25/2017    Dyslipidemia    ESRD (end stage renal disease) ([place])    Gait abnormality 04/25/2017   Glaucoma 01-08-13   tx. eye drops   Hemodialysis patient ([place])    [place] (hard of hearing)    HTN (hypertension)    Hypercholesterolemia    Left ventricular dysfunction    28% per nuclear in 2010 and 35 to 40% per echo in 2010   Myocardial infarction [facility])    1985   Neuromuscular disorder ([place])    legs mild paralysis-able to walk"nerve damage"-legs- left leg brace    Neuropathy    Pneumonia    hx of several times years ago    PVD (peripheral vascular disease) ([place])    a. s/p PTA/stenting of L CIA 02/2019.   Renal artery stenosis (HCC)    a. moderate bilateral by angio 02/2019.   Spinal stenosis     Patient Active Problem List   Diagnosis Date Noted   Healthcare maintenance 04/08/2019   GERD (gastroesophageal reflux disease) 04/08/2019   ESRD (end stage renal disease) ([place]) 02/05/2019   Claudication in peripheral vascular disease ([place]) 02/04/2019   Peripheral arterial disease ([place]) 01/21/2019   Pneumonia of both lower lobes due to infectious organism 12/17/2018   Shortness of breath 12/03/2018   Bronchitis 10/13/2018   Insomnia 05/27/2018   ICD (implantable cardioverter-defibrillator) discharge 08/22/2017  Ventricular tachycardia ([place]) 08/22/2017   Gout 07/21/2017   Diabetic neuropathy ([place]) 04/25/2017   Gait abnormality 04/25/2017   Numbness and tingling of both feet 03/04/2017   Peripheral neuropathy 03/04/2017   Carotid artery disease ([place]) 01/09/2017   Chronic combined systolic and diastolic CHF (congestive heart failure) ([place]) 07/01/2016   Restrictive lung disease 01/22/2016   Severe obstructive sleep apnea 12/17/2015   Eunuchoidism 12/05/2014   LBBB (left bundle branch block) 10/10/2014   Syncope 10/06/2014   Sinusitis, chronic 07/16/2013   Asthma, moderate persistent 07/08/2013   Benign prostatic hyperplasia without urinary obstruction  12/18/2011   CAD s/p coronary arthery bypass graft    H/O Spinal stenosis    DM (diabetes mellitus), type 2 with renal complications ([place]) 12/87/8676   Benign hypertensive heart disease without heart failure 11/07/2010   Allergic rhinitis 11/07/2010   Osteoarthritis 11/07/2010   Ischemic cardiomyopathy    Hypercholesterolemia     Past Surgical History:  Procedure Laterality Date   A/V FISTULAGRAM Right 10/17/2016   Procedure: A/V Fistulagram;  Surgeon: Conrad Matthews, MD;  Location: [facility];  Service: Cardiovascular;  Laterality: Right;   ABDOMINAL AORTOGRAM N/A 02/04/2019   Procedure: ABDOMINAL AORTOGRAM;  Surgeon: Lorretta Harp, MD;  Location: [facility];  Service: Cardiovascular;  Laterality: N/A;   BACK SURGERY     hx of back surgery x 4    BASCILIC VEIN TRANSPOSITION Right 09/09/2016   Procedure: BASCILIC VEIN TRANSPOSITION Right Arm;  Surgeon: Rosetta Posner, MD;  Location: [facility] OR;  Service: Vascular;  Laterality: Right;   BI-VENTRICULAR IMPLANTABLE CARDIOVERTER DEFIBRILLATOR N/A 10/10/2014   Procedure: BI-VENTRICULAR IMPLANTABLE CARDIOVERTER DEFIBRILLATOR  (CRT-D);  Surgeon: [name], MD;  Location: [facility] CATH LAB;  Service: Cardiovascular;  Laterality: N/A;   CARDIAC CATHETERIZATION  2003   Cataract      recent cataract surgery 6'14   COLONOSCOPY N/A 01/25/2013   Procedure: COLONOSCOPY;  Surgeon: Irene Shipper, MD;  Location: [facility] ENDOSCOPY;  Service: Endoscopy;  Laterality: N/A;   CORONARY ARTERY BYPASS GRAFT  1985   x 5 vessels   LOWER EXTREMITY ANGIOGRAPHY Bilateral 02/04/2019   Procedure: Lower Extremity Angiography;  Surgeon: Lorretta Harp, MD;  Location: [facility] LAB;  Service: Cardiovascular;  Laterality: Bilateral;  limited study to iliacs   LUMBAR DISC SURGERY     PERIPHERAL VASCULAR BALLOON ANGIOPLASTY Right 10/17/2016   Procedure: Peripheral Vascular Balloon Angioplasty;  Surgeon: Conrad Lockwood, MD;  Location: [facility]  LAB;  Service: Cardiovascular;  Laterality: Right;  ARM VEINOUS AND CENTRAL VEIN   PERIPHERAL VASCULAR INTERVENTION Left 02/04/2019   Procedure: PERIPHERAL VASCULAR INTERVENTION;  Surgeon: Lorretta Harp, MD;  Location: [facility] LAB;  Service: Cardiovascular;  Laterality: Left;  common iliac   PR POLYSOM 6/>YRS SLEEP 4/> ADDL PARAM ATTND  12/07/2015   PROSTATE SURGERY     TOTAL KNEE ARTHROPLASTY  08/01/2011   Procedure: TOTAL KNEE ARTHROPLASTY;  Surgeon: Johnn Hai;  Location: [facility] ORS;  Service: Orthopedics;  Laterality: Right;        Home Medications    Prior to Admission medications   Medication Sig Start Date End Date Taking? Authorizing Provider  albuterol (PROVENTIL) (2.5 MG/3ML) 0.083% nebulizer solution Take 3 mLs (2.5 mg total) by nebulization every 6 (six) hours as needed for wheezing or shortness of breath. 12/29/18  Yes Magdalen Spatz, NP  Albuterol Sulfate (PROAIR RESPICLICK) 720 (90 Base) MCG/ACT AEPB Inhale 1-2 puffs  into the lungs every 6 (six) hours as needed. Patient taking differently: Inhale 1-2 puffs into the lungs every 6 (six) hours as needed (shortness of breath).  04/08/18  Yes Juanito Doom, MD  allopurinol (ZYLOPRIM) 100 MG tablet TAKE 1 TABLET DAILY Patient taking differently: Take 100 mg by mouth daily.  02/16/19  Yes Burns, Claudina Lick, MD  amiodarone (PACERONE) 200 MG tablet TAKE 1 TABLET DAILY Patient taking differently: Take 200 mg by mouth daily.  05/12/19  Yes [name], MD  aspirin EC 81 MG tablet Take 81 mg by mouth at bedtime.    Yes [provider]  atorvastatin (LIPITOR) 80 MG tablet Take 1 tablet (80 mg total) by mouth every evening. Please make overdue appt with Dr. Acie Fredrickson before anymore refills. 1st attempt 02/17/19  Yes Nahser, Wonda Cheng, MD  calcitRIOL (ROCALTROL) 0.25 MCG capsule Take 1 capsule (0.25 mcg total) by mouth every Monday, Wednesday, and Friday with hemodialysis. 12/19/17  Yes Georgette Shell, MD  calcium  carbonate (TUMS) 500 MG chewable tablet Chew 1 tablet by mouth 2 (two) times daily as needed for indigestion or heartburn.   Yes [provider]  cetirizine (ZYRTEC) 10 MG tablet Take 10 mg by mouth at bedtime as needed (seasonal allergies).    Yes [provider]  clopidogrel (PLAVIX) 75 MG tablet Take 1 tablet (75 mg total) by mouth daily. 02/05/19  Yes Dunn, Nedra Hai, PA-C  clotrimazole-betamethasone (LOTRISONE) lotion Apply to left foot twice daily 12/15/18  Yes [place], Stephani Police, DPM  dicyclomine (BENTYL) 10 MG capsule Take 10 mg by mouth 3 (three) times daily as needed for spasms.  05/13/18  Yes [provider]  fluticasone furoate-vilanterol (BREO ELLIPTA) 200-25 MCG/INH AEPB Inhale 1 puff into the lungs daily. 12/17/18  Yes Lauraine Rinne, NP  glipiZIDE (GLUCOTROL XL) 5 MG 24 hr tablet Take 5 mg by mouth every evening.  06/23/18  Yes [provider]  multivitamin (RENA-VIT) TABS tablet Take 1 tablet by mouth at bedtime. 12/19/17  Yes Georgette Shell, MD  Omalizumab [place]vid Right [place]) Inject into the skin every 21 ( twenty-one) days.   Yes [provider]  omeprazole (PRILOSEC) 20 MG capsule Take 1 capsule (20 mg total) by mouth daily. 02/24/19  Yes Burns, Claudina Lick, MD  polyethylene glycol (MIRALAX / GLYCOLAX) packet Take 17 g by mouth daily as needed for mild constipation. 12/19/17  Yes Georgette Shell, MD  sodium chloride (OCEAN) 0.65 % SOLN nasal spray Place 1 spray into both nostrils as needed for congestion.   Yes [provider]  tadalafil (CIALIS) 20 MG tablet Take 20 mg by mouth daily as needed for erectile dysfunction. Do not exceed 2 doses in 7 days   Yes [provider]  testosterone cypionate (DEPOTESTOTERONE CYPIONATE) 100 MG/ML injection Inject 400 mg into the muscle every 21 ( twenty-one) days. For IM use only   Yes [provider]  zolpidem (AMBIEN) 10 MG tablet TAKE 1/2 (ONE-HALF) TABLET BY MOUTH AT BEDTIME .   TAKE  AN  ADDITIONAL  1/2  TABLET  IF  NEEDED  FOR  SLEEP Patient taking differently: Take 10-15 mg by mouth at bedtime. Take an additional 1/2 tab if needed to fall asleep. 05/13/19  Yes Burns, Claudina Lick, MD  OXYGEN Inhale 2 L into the lungs See admin instructions. Inhale 2 L into lungs during dialysis on Monday, Wednesday and Friday and every night at bedtime    [provider]  Syringe/Needle, Disp, (SYRINGE 3CC/22GX1") 22G X 1" 3 ML MISC Use to give injection 07/09/13   [provider]    Family History Family History  Problem Relation Age of Onset   Heart attack Father    Heart disease Father    Colon polyps Father    Lung cancer Mother    Diabetes Sister        x 2   Heart disease Brother        x 2   Bone cancer Brother    Lung disease Neg Hx     Social History Social History   Tobacco Use   Smoking status: Former Smoker    Packs/day: 1.00    Years: 10.00    Pack years: 10.00    Types: Cigarettes, Pipe, Cigars    Start date: 12/02/1949    Quit date: 08/05/1958    Years since quitting: 60.8   Smokeless tobacco: Never Used  Substance Use Topics   Alcohol use: No   Drug use: No     Allergies   Codeine, Dacarbazine, Hydrocodone, Pseudoephedrine, and Azithromycin   Review of Systems Review of Systems  Constitutional: Negative for fever.  HENT: Negative for sore throat.   Eyes: Negative for redness.  Respiratory: Negative for cough and shortness of breath.   Cardiovascular: Negative for chest pain and leg swelling.  Gastrointestinal: Positive for abdominal pain. Negative for vomiting.  Genitourinary: Negative for flank pain.  Musculoskeletal: Negative for back pain and neck pain.  Skin: Negative for rash.  Neurological: Negative for headaches.  Hematological: Does not bruise/bleed easily.  Psychiatric/Behavioral: Negative for confusion.     Physical Exam Updated Vital Signs BP 125/70    Pulse 83    Temp 98.3 F (36.8 C) (Oral)     Resp 15    Wt 92 kg    SpO2 94%    BMI 29.95 kg/m   Physical Exam Vitals signs and nursing note reviewed.  Constitutional:      Appearance: Normal appearance. He is well-developed.  HENT:     Head: Atraumatic.     Nose: Nose normal.     Mouth/Throat:     Mouth: Mucous membranes are moist.     Pharynx: Oropharynx is clear.  Eyes:     General: No scleral icterus.    Conjunctiva/sclera: Conjunctivae normal.  Neck:     Musculoskeletal: Normal range of motion and neck supple. No neck rigidity.     Trachea: No tracheal deviation.  Cardiovascular:     Rate and Rhythm: Normal rate and regular rhythm.     Pulses: Normal pulses.     Heart sounds: Normal heart sounds. No murmur. No friction rub. No gallop.   Pulmonary:     Effort: Pulmonary effort is normal. No accessory muscle usage or respiratory distress.     Breath sounds: Normal breath sounds.  Abdominal:     General: Bowel sounds are normal. There is no distension.     Palpations: Abdomen is soft.     Tenderness: There is abdominal tenderness. There is no guarding.     Comments: Mid and upper abd tenderness. No pulsatile mass felt.   Genitourinary:    Comments: No cva tenderness. Musculoskeletal:        General: No swelling.     Right lower leg: No edema.     Left lower leg: No edema.  Skin:    General: Skin is warm and dry.     Findings: No  rash.  Neurological:     Mental Status: He is alert.     Comments: Alert, speech clear.   Psychiatric:        Mood and Affect: Mood normal.      ED Treatments / Results  Labs (all labs ordered are listed, but only abnormal results are displayed) Results for orders placed or performed during the hospital encounter of 05/30/19  Lipase, blood  Result Value Ref Range   Lipase 64 (H) 11 - 51 U/L  Comprehensive metabolic panel  Result Value Ref Range   Sodium 137 135 - 145 mmol/L   Potassium 3.4 (L) 3.5 - 5.1 mmol/L   Chloride 97 (L) 98 - 111 mmol/L   CO2 25 22 - 32 mmol/L    Glucose, Bld 126 (H) 70 - 99 mg/dL   BUN 50 (H) 8 - 23 mg/dL   Creatinine, Ser 7.30 (H) 0.61 - 1.24 mg/dL   Calcium 9.2 8.9 - 10.3 mg/dL   Total Protein 7.0 6.5 - 8.1 g/dL   Albumin 3.4 (L) 3.5 - 5.0 g/dL   AST 10 (L) 15 - 41 U/L   ALT 19 0 - 44 U/L   Alkaline Phosphatase 69 38 - 126 U/L   Total Bilirubin 1.6 (H) 0.3 - 1.2 mg/dL   GFR calc non Af Amer 6 (L) >60 mL/min   GFR calc Af Amer 7 (L) >60 mL/min   Anion gap 15 5 - 15  CBC  Result Value Ref Range   WBC 4.0 4.0 - 10.5 K/uL   RBC 3.25 (L) 4.22 - 5.81 MIL/uL   Hemoglobin 9.9 (L) 13.0 - 17.0 g/dL   HCT 32.7 (L) 39.0 - 52.0 %   MCV 100.6 (H) 80.0 - 100.0 fL   MCH 30.5 26.0 - 34.0 pg   MCHC 30.3 30.0 - 36.0 g/dL   RDW 19.0 (H) 11.5 - 15.5 %   Platelets 55 (L) 150 - 400 K/uL   nRBC 0.0 0.0 - 0.2 %   Ct Abdomen Pelvis W Contrast  Result Date: 05/30/2019 CLINICAL DATA:  Acute abdominal pain EXAM: CT ABDOMEN AND PELVIS WITH CONTRAST TECHNIQUE: Multidetector CT imaging of the abdomen and pelvis was performed using the standard protocol following bolus administration of intravenous contrast. CONTRAST:  151mL OMNIPAQUE IOHEXOL 300 MG/ML  SOLN COMPARISON:  Abdominal ultrasound dated 06/20/2006 and renal ultrasound dated 08/28/2017. FINDINGS: Lower chest: Scattered bilateral ground-glass opacities are noted. Hepatobiliary: No focal liver abnormality is seen. No gallstones, gallbladder wall thickening, or biliary dilatation. Pancreas: There is a 1.6 cm cystic lesion in the tail of the pancreas. There is no definite solid component or thickened wall. No pancreatic ductal dilatation or surrounding inflammatory changes. Spleen: Normal in size without focal abnormality. Adrenals/Urinary Tract: Adrenal glands are unremarkable. Other than bilateral renal cysts, the kidneys are normal, without renal calculi, focal lesion, or hydronephrosis. Bladder is unremarkable. Stomach/Bowel: Stomach is within normal limits. Appendix appears normal. There is colonic  diverticulosis without evidence of diverticulitis. No evidence of bowel wall thickening, distention, or inflammatory changes. Vascular/Lymphatic: Aortic atherosclerosis. A stent is seen in the left common iliac artery. No enlarged abdominal or pelvic lymph nodes. Three mildly enlarged lymph nodes are seen in the left groin near the common femoral artery. Central hypoattenuation may represent necrosis. Reproductive: Prostate is unremarkable. Other: Trace free abdominal fluid is seen in the pelvis. No abdominal wall hernia is identified. Musculoskeletal: Severe degenerative changes are seen in the lumbar spine. IMPRESSION: 1. No acute findings are  evident in the abdomen or pelvis. 2. Colonic diverticulosis without evidence of diverticulitis. 3. Trace free abdominal fluid in the pelvis. 4. Mildly enlarged lymph nodes in the left groin near the common femoral artery. Central hypoattenuation may represent necrosis. 5. A 1.6 cm cystic lesion in the tail of the pancreas. Repeat imaging with MR (preferred) or CT could be performed in 2 years if the patient's overall health and preferences make the patient a potential surgical candidate. Aortic Atherosclerosis (ICD10-I70.0). Electronically Signed   By: Zerita Boers M.D.   On: 05/30/2019 17:39    EKG None  Radiology Ct Abdomen Pelvis W Contrast  Result Date: 05/30/2019 CLINICAL DATA:  Acute abdominal pain EXAM: CT ABDOMEN AND PELVIS WITH CONTRAST TECHNIQUE: Multidetector CT imaging of the abdomen and pelvis was performed using the standard protocol following bolus administration of intravenous contrast. CONTRAST:  110mL OMNIPAQUE IOHEXOL 300 MG/ML  SOLN COMPARISON:  Abdominal ultrasound dated 06/20/2006 and renal ultrasound dated 08/28/2017. FINDINGS: Lower chest: Scattered bilateral ground-glass opacities are noted. Hepatobiliary: No focal liver abnormality is seen. No gallstones, gallbladder wall thickening, or biliary dilatation. Pancreas: There is a 1.6 cm  cystic lesion in the tail of the pancreas. There is no definite solid component or thickened wall. No pancreatic ductal dilatation or surrounding inflammatory changes. Spleen: Normal in size without focal abnormality. Adrenals/Urinary Tract: Adrenal glands are unremarkable. Other than bilateral renal cysts, the kidneys are normal, without renal calculi, focal lesion, or hydronephrosis. Bladder is unremarkable. Stomach/Bowel: Stomach is within normal limits. Appendix appears normal. There is colonic diverticulosis without evidence of diverticulitis. No evidence of bowel wall thickening, distention, or inflammatory changes. Vascular/Lymphatic: Aortic atherosclerosis. A stent is seen in the left common iliac artery. No enlarged abdominal or pelvic lymph nodes. Three mildly enlarged lymph nodes are seen in the left groin near the common femoral artery. Central hypoattenuation may represent necrosis. Reproductive: Prostate is unremarkable. Other: Trace free abdominal fluid is seen in the pelvis. No abdominal wall hernia is identified. Musculoskeletal: Severe degenerative changes are seen in the lumbar spine. IMPRESSION: 1. No acute findings are evident in the abdomen or pelvis. 2. Colonic diverticulosis without evidence of diverticulitis. 3. Trace free abdominal fluid in the pelvis. 4. Mildly enlarged lymph nodes in the left groin near the common femoral artery. Central hypoattenuation may represent necrosis. 5. A 1.6 cm cystic lesion in the tail of the pancreas. Repeat imaging with MR (preferred) or CT could be performed in 2 years if the patient's overall health and preferences make the patient a potential surgical candidate. Aortic Atherosclerosis (ICD10-I70.0). Electronically Signed   By: Zerita Boers M.D.   On: 05/30/2019 17:39    Procedures Procedures (including critical care time)  Medications Ordered in ED Medications  iohexol (OMNIPAQUE) 300 MG/ML solution 100 mL (has no administration in time range)      Initial Impression / Assessment and Plan / ED Course  I have reviewed the triage vital signs and the nursing notes.  Pertinent labs & imaging results that were available during my care of the patient were reviewed by me and considered in my medical decision making (see chart for details).  Iv ns. Stat labs ordered. Imaging ordered.  Reviewed nursing notes and prior charts for additional history.   Labs reviewed/interpreted by me - wbc normal, lipase sl elev.   CT reviewed-interpreted by me - 1-2 cm pancreatic cystic mass. Discussed w pt and need for outpt f/u.   pepcid po. Acetaminophen po.  Recheck pt comfortable. Pt  is afebrile. On recheck abd soft nt.   Pt currently appears stable for d/c.  rec close pcp f/u.  Return precautions provided.     Final Clinical Impressions(s) / ED Diagnoses   Final diagnoses:  None    ED Discharge Orders    None       Lajean Saver, MD 05/31/19 [phone]

## 2019-05-31 ENCOUNTER — Telehealth: Payer: Self-pay | Admitting: Internal Medicine

## 2019-05-31 DIAGNOSIS — D631 Anemia in chronic kidney disease: Secondary | ICD-10-CM | POA: Diagnosis not present

## 2019-05-31 DIAGNOSIS — N2581 Secondary hyperparathyroidism of renal origin: Secondary | ICD-10-CM | POA: Diagnosis not present

## 2019-05-31 DIAGNOSIS — D509 Iron deficiency anemia, unspecified: Secondary | ICD-10-CM | POA: Diagnosis not present

## 2019-05-31 DIAGNOSIS — Z992 Dependence on renal dialysis: Secondary | ICD-10-CM | POA: Diagnosis not present

## 2019-05-31 DIAGNOSIS — N186 End stage renal disease: Secondary | ICD-10-CM | POA: Diagnosis not present

## 2019-05-31 DIAGNOSIS — E1129 Type 2 diabetes mellitus with other diabetic kidney complication: Secondary | ICD-10-CM | POA: Diagnosis not present

## 2019-05-31 NOTE — Telephone Encounter (Signed)
FYI seen in ED.

## 2019-05-31 NOTE — Telephone Encounter (Signed)
Patient spoke with [facility] on 05/30/19 at 12:37pm.  States he was having stomach pain.  States currently his pain is a 3 of 10 but can go up to 7 or 8.   He was advised to go to the ED.

## 2019-05-31 NOTE — Progress Notes (Signed)
Subjective:    Patient ID: Shane [name], male    DOB: 1933-12-31, 85 y.o.   MRN: [id number]  HPI The patient is here for follow up from the ED.   ED 05/30/19 for epigastric pain.  The pain started a few months ago.  The pain is constant, dull, gradual onset, non-radiating, mild-mod.  It is worse after eating.  He has early satiety.  maalox helped initially, but his nephrologist advised him to stop it.  He was started on simethicone and prilosec.  He noted some improvement, but still had the pain.  He stated occasional reflux.  He denied chest pain, DOE, nausea.  He denied weight loss.  His BM were normal.  CT showed 1-2 cm pancreatic cystic mass.  Basic blood work stable. Lipase slightly elevated.  He received pepcid, tylenol.    He was taking omeprazole 40 mg daily and then his kidney doctor increased this to twice daily for the symptoms, which seemed to help more.  In the emergency room this was stopped and he was placed on Pepcid 10 mg daily, which he is taking.  He continues to have constant epigastric pain.  If he lays down his head has to be elevated and he still often has to sit up on the side of the bed.  He feels reflux and spits up a lot of mucus.  The symptoms are worse after eating.  He has the symptoms normal 4-1 5 times a day.   Overall he does not feel well.  When he goes to dialysis he typically feels well, but when he leaves he does not feel well and it is taking him longer to recover.  They have been making adjustments to his dry weight and that has helped.   Medications and allergies reviewed with patient and updated if appropriate.  Patient Active Problem List   Diagnosis Date Noted  . Healthcare maintenance 04/08/2019  . GERD (gastroesophageal reflux disease) 04/08/2019  . ESRD (end stage renal disease) ([place]) 02/05/2019  . Claudication in peripheral vascular disease ([place]) 02/04/2019  . Peripheral arterial disease ([place]) 01/21/2019  . Pneumonia of both lower lobes  due to infectious organism 12/17/2018  . Shortness of breath 12/03/2018  . Bronchitis 10/13/2018  . Insomnia 05/27/2018  . ICD (implantable cardioverter-defibrillator) discharge 08/22/2017  . Ventricular tachycardia ([place]) 08/22/2017  . Gout 07/21/2017  . Diabetic neuropathy ([place]) 04/25/2017  . Gait abnormality 04/25/2017  . Numbness and tingling of both feet 03/04/2017  . Peripheral neuropathy 03/04/2017  . Carotid artery disease ([place]) 01/09/2017  . Chronic combined systolic and diastolic CHF (congestive heart failure) ([place]) 07/01/2016  . Restrictive lung disease 01/22/2016  . Severe obstructive sleep apnea 12/17/2015  . 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 LBBB (left bundle branch block) 10/10/2014  . Syncope 10/06/2014  . Sinusitis, chronic 07/16/2013  . Asthma, moderate persistent 07/08/2013  . Benign prostatic hyperplasia without urinary obstruction 12/18/2011  . CAD s/p coronary arthery bypass graft   . H/O Spinal stenosis   . DM (diabetes mellitus), type 2 with renal complications ([place]) 57/32/2025  . Benign hypertensive heart disease without heart failure 11/07/2010  . Allergic rhinitis 11/07/2010  . Osteoarthritis 11/07/2010  . Ischemic cardiomyopathy   . Hypercholesterolemia     Current Outpatient Medications on File Prior to Visit  Medication Sig Dispense Refill  . albuterol (PROVENTIL) (2.5 MG/3ML) 0.083% nebulizer solution Take 3 mLs (2.5 mg total) by nebulization every 6 (six) hours as needed for wheezing or shortness of  breath. 120 mL 3  . Albuterol Sulfate (PROAIR RESPICLICK) 818 (90 Base) MCG/ACT AEPB Inhale 1-2 puffs into the lungs every 6 (six) hours as needed. (Patient taking differently: Inhale 1-2 puffs into the lungs every 6 (six) hours as needed (shortness of breath). ) 1 each 5  . allopurinol (ZYLOPRIM) 100 MG tablet TAKE 1 TABLET DAILY (Patient taking differently: Take 100 mg by mouth daily. ) 90 tablet 1  . amiodarone (PACERONE) 200 MG tablet TAKE 1 TABLET DAILY  (Patient taking differently: Take 200 mg by mouth daily. ) 90 tablet 2  . aspirin EC 81 MG tablet Take 81 mg by mouth at bedtime.     Marland Kitchen atorvastatin (LIPITOR) 80 MG tablet Take 1 tablet (80 mg total) by mouth every evening. Please make overdue appt with Dr. Acie Fredrickson before anymore refills. 1st attempt 90 tablet 0  . calcitRIOL (ROCALTROL) 0.25 MCG capsule Take 1 capsule (0.25 mcg total) by mouth every Monday, Wednesday, and Friday with hemodialysis. 60 capsule 0  . calcium carbonate (TUMS) 500 MG chewable tablet Chew 1 tablet by mouth 2 (two) times daily as needed for indigestion or heartburn.    . cetirizine (ZYRTEC) 10 MG tablet Take 10 mg by mouth at bedtime as needed (seasonal allergies).     . clopidogrel (PLAVIX) 75 MG tablet Take 1 tablet (75 mg total) by mouth daily. 90 tablet 3  . clotrimazole-betamethasone (LOTRISONE) lotion Apply to left foot twice daily 30 mL 1  . dicyclomine (BENTYL) 10 MG capsule Take 10 mg by mouth 3 (three) times daily as needed for spasms.   3  . famotidine (PEPCID) 10 MG tablet Take 10 mg by mouth daily.    . fluticasone furoate-vilanterol (BREO ELLIPTA) 200-25 MCG/INH AEPB Inhale 1 puff into the lungs daily. 180 each 2  . glipiZIDE (GLUCOTROL XL) 5 MG 24 hr tablet Take 5 mg by mouth every evening.   6  . multivitamin (RENA-VIT) TABS tablet Take 1 tablet by mouth at bedtime. 60 tablet 0  . Omalizumab (XOLAIR [place]) Inject into the skin every 21 ( twenty-one) days.    . OXYGEN Inhale 2 L into the lungs See admin instructions. Inhale 2 L into lungs during dialysis on Monday, Wednesday and Friday and every night at bedtime    . polyethylene glycol (MIRALAX / GLYCOLAX) packet Take 17 g by mouth daily as needed for mild constipation. 14 each 0  . sodium chloride (OCEAN) 0.65 % SOLN nasal spray Place 1 spray into both nostrils as needed for congestion.    . Syringe/Needle, Disp, (SYRINGE 3CC/22GX1") 22G X 1" 3 ML MISC Use to give injection    . tadalafil (CIALIS) 20 MG  tablet Take 20 mg by mouth daily as needed for erectile dysfunction. Do not exceed 2 doses in 7 days    . testosterone cypionate (DEPOTESTOTERONE CYPIONATE) 100 MG/ML injection Inject 400 mg into the muscle every 21 ( twenty-one) days. For IM use only    . zolpidem (AMBIEN) 10 MG tablet TAKE 1/2 (ONE-HALF) TABLET BY MOUTH AT BEDTIME .  TAKE  AN  ADDITIONAL  1/2  TABLET  IF  NEEDED  FOR  SLEEP (Patient taking differently: Take 10-15 mg by mouth at bedtime. Take an additional 1/2 tab if needed to fall asleep.) 30 tablet 0   No current facility-administered medications on file prior to visit.     Past Medical History:  Diagnosis Date  . Abnormal nuclear cardiac imaging test Nov 2010   moderate area  of infarct in the inferior wall with only minimal reversibility and EF of 28%  . AICD (automatic cardioverter/defibrillator) present   . Allergic rhinitis 01-08-13   Uses nebulizer for chronic sinus issues and Mucinex.  . Arthritis    osteoarthritis   . Asthma    Extrinic  . Blood transfusion    ? at time of bypass surgery   . BPH (benign prostatic hyperplasia)   . Cellulitis of arm, left    MSSA  . CHF (congestive heart failure) ([place])   . Colon polyps    adenomatous  . Coronary artery disease    remote CABG in 1985, cath in 2003 by Dr. Lia Foyer with no PCI, last nuclear in 2010 showing scar and EF of 28%.   . Diabetes mellitus   . Diabetic neuropathy ([place]) 04/25/2017  . Dyslipidemia   . ESRD (end stage renal disease) ([place])   . Gait abnormality 04/25/2017  . Glaucoma 01-08-13   tx. eye drops  . Hemodialysis patient ([place])   . HOH (hard of hearing)   . HTN (hypertension)   . Hypercholesterolemia   . Left ventricular dysfunction    28% per nuclear in 2010 and 35 to 40% per echo in 2010  . Myocardial infarction ([place])    1985  . Neuromuscular disorder ([place])    legs mild paralysis-able to walk"nerve damage"-legs- left leg brace   . Neuropathy   . Pneumonia    hx of several times years ago    . PVD (peripheral vascular disease) ([place])    a. s/p PTA/stenting of L CIA 02/2019.  Marland Kitchen Renal artery stenosis ([facility])    a. moderate bilateral by angio 02/2019.  Marland Kitchen Spinal stenosis     Past Surgical History:  Procedure Laterality Date  . A/V FISTULAGRAM Right 10/17/2016   Procedure: A/V Fistulagram;  Surgeon: Conrad Kiefer, MD;  Location: [facility];  Service: Cardiovascular;  Laterality: Right;  . ABDOMINAL AORTOGRAM N/A 02/04/2019   Procedure: ABDOMINAL AORTOGRAM;  Surgeon: Lorretta Harp, MD;  Location: [facility];  Service: Cardiovascular;  Laterality: N/A;  . BACK SURGERY     hx of back surgery x 4   . BASCILIC VEIN TRANSPOSITION Right 09/09/2016   Procedure: BASCILIC VEIN TRANSPOSITION Right Arm;  Surgeon: Rosetta Posner, MD;  Location: [facility] OR;  Service: Vascular;  Laterality: Right;  . BI-VENTRICULAR IMPLANTABLE CARDIOVERTER DEFIBRILLATOR N/A 10/10/2014   Procedure: BI-VENTRICULAR IMPLANTABLE CARDIOVERTER DEFIBRILLATOR  (CRT-D);  Surgeon: [name], MD;  Location: [facility] C[facility] LAB;  Service: Cardiovascular;  Laterality: N/A;  . CARDIAC CATHETERIZATION  2003  . Cataract      recent cataract surgery 6'14  . COLONOSCOPY N/A 01/25/2013   Procedure: COLONOSCOPY;  Surgeon: Irene Shipper, MD;  Location: [facility];  Service: Endoscopy;  Laterality: N/A;  . CORONARY ARTERY BYPASS GRAFT  1985   x 5 vessels  . LOWER EXTREMITY ANGIOGRAPHY Bilateral 02/04/2019   Procedure: Lower Extremity Angiography;  Surgeon: Lorretta Harp, MD;  Location: [facility];  Service: Cardiovascular;  Laterality: Bilateral;  limited study to iliacs  . LUMBAR DISC SURGERY    . PERIPHERAL VASCULAR BALLOON ANGIOPLASTY Right 10/17/2016   Procedure: Peripheral Vascular Balloon Angioplasty;  Surgeon: Conrad Franklin, MD;  Location: [facility];  Service: Cardiovascular;  Laterality: Right;  ARM VEINOUS AND CENTRAL VEIN  . PERIPHERAL VASCULAR INTERVENTION Left 02/04/2019   Procedure: PERIPHERAL VASCULAR  INTERVENTION;  Surgeon: Lorretta Harp, MD;  Location: [facility];  Service: Cardiovascular;  Laterality: Left;  common iliac  . PR POLYSOM 6/>YRS SLEEP 4/> ADDL PARAM ATTND  12/07/2015  . PROSTATE SURGERY    . TOTAL KNEE ARTHROPLASTY  08/01/2011   Procedure: TOTAL KNEE ARTHROPLASTY;  Surgeon: Johnn Hai;  Location: [facility];  Service: Orthopedics;  Laterality: Right;    Social History   Socioeconomic History  . Marital status: Married    Spouse name: Not on file  . Number of children: 2  . Years of education: Not on file  . Highest education level: Not on file  Occupational History  . Occupation: Theme park manager  Social Needs  . Financial resource strain: Not on file  . Food insecurity    Worry: Not on file    Inability: Not on file  . Transportation needs    Medical: Not on file    Non-medical: Not on file  Tobacco Use  . Smoking status: Former Smoker    Packs/day: 1.00    Years: 10.00    Pack years: 10.00    Types: Cigarettes, Pipe, Cigars    Start date: 12/02/1949    Quit date: 08/05/1958    Years since quitting: 60.8  . Smokeless tobacco: Never Used  Substance and Sexual Activity  . Alcohol use: No  . Drug use: No  . Sexual activity: Not on file  Lifestyle  . Physical activity    Days per week: Not on file    Minutes per session: Not on file  . Stress: Not on file  Relationships  . Social Herbalist on phone: Not on file    Gets together: Not on file    Attends religious service: Not on file    Active member of club or organization: Not on file    Attends meetings of clubs or organizations: Not on file    Relationship status: Not on file  Other Topics Concern  . Not on file  Social History Narrative   Originally from [place]. He has always lived in [place]. Prior travel to [place], [place], [place], [place] ,[place], & [place]. 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 Has adopted children. Has a dog currently. No bird exposure. No mold  exposure. Enjoys reading & traveling.    Family History  Problem Relation Age of Onset  . Heart attack Father   . Heart disease Father   . Colon polyps Father   . Lung cancer Mother   . Diabetes Sister        x 2  . Heart disease Brother        x 2  . Bone cancer Brother   . Lung disease Neg Hx     Review of Systems  Constitutional: Positive for diaphoresis (at night x 1 week). Negative for chills and fever.  HENT: Negative for sore throat, trouble swallowing and voice change.   Respiratory: Positive for shortness of breath (wtih exertion). Negative for cough and wheezing.   Cardiovascular: Negative for chest pain.  Gastrointestinal: Positive for abdominal pain (epigastric region), constipation and nausea. Negative for blood in stool and vomiting.       Objective:   Vitals:   06/01/19 1450  BP: 134/62  Pulse: 85  Resp: 18  Temp: 98.5 F (36.9 C)  SpO2: 95%   BP Readings from Last 3 Encounters:  06/01/19 134/62  05/30/19 (!) 150/86  04/29/19 124/74   Wt Readings from Last 3 Encounters:  06/01/19  215 lb (97.5 kg)  05/30/19 202 lb 13.2 oz (92 kg)  04/29/19 203 lb (92.1 kg)   Body mass index is 31.75 kg/m.   Physical Exam    Constitutional: Appears well-developed and well-nourished. No distress.  HENT:  Head: Normocephalic and atraumatic.  Neck: Neck supple. No tracheal deviation present. No thyromegaly present.  No cervical lymphadenopathy Cardiovascular: Normal rate, regular rhythm and normal heart sounds.   No murmur heard. No carotid bruit .  No edema Pulmonary/Chest: Effort normal and breath sounds normal. No respiratory distress. No has no wheezes. No rales. Abdomen: Slightly obese, soft, tenderness in the epigastric region down to umbilicus without rebound or guarding, no other tenderness throughout abdomen Skin: Skin is warm and dry. Not diaphoretic.  Psychiatric: Normal mood and affect. Behavior is normal.      Assessment & Plan:    See  Problem List for Assessment and Plan of chronic medical problems.

## 2019-05-31 NOTE — Telephone Encounter (Signed)
Xolair Prefilled Syringe Order: 150mg  Prefilled Syringe:  #4 75mg  Prefilled Syringe: N/A Ordered Date: 05/31/19  Expected date of arrival: 06/01/19  Ordered by: Parke Poisson, [place]  [facility]ty [facility]: [facility]

## 2019-06-01 ENCOUNTER — Encounter: Payer: Self-pay | Admitting: Internal Medicine

## 2019-06-01 ENCOUNTER — Other Ambulatory Visit: Payer: Self-pay

## 2019-06-01 ENCOUNTER — Ambulatory Visit (INDEPENDENT_AMBULATORY_CARE_PROVIDER_SITE_OTHER): Payer: Medicare Other | Admitting: Internal Medicine

## 2019-06-01 VITALS — BP 134/62 | HR 85 | Temp 98.5°F | Resp 18 | Ht 69.0 in | Wt 215.0 lb

## 2019-06-01 DIAGNOSIS — K862 Cyst of pancreas: Secondary | ICD-10-CM

## 2019-06-01 DIAGNOSIS — K219 Gastro-esophageal reflux disease without esophagitis: Secondary | ICD-10-CM | POA: Diagnosis not present

## 2019-06-01 DIAGNOSIS — Z23 Encounter for immunization: Secondary | ICD-10-CM | POA: Diagnosis not present

## 2019-06-01 DIAGNOSIS — I255 Ischemic cardiomyopathy: Secondary | ICD-10-CM

## 2019-06-01 MED ORDER — ATORVASTATIN CALCIUM 80 MG PO TABS: 80.0000 mg | ORAL_TABLET | Freq: Every evening | ORAL | 3 refills | Status: AC

## 2019-06-01 MED ORDER — FAMOTIDINE 40 MG PO TABS: 40.0000 mg | ORAL_TABLET | Freq: Every day | ORAL | 3 refills | Status: AC

## 2019-06-01 MED ORDER — ESOMEPRAZOLE MAGNESIUM 40 MG PO CPDR: 40.0000 mg | DELAYED_RELEASE_CAPSULE | Freq: Two times a day (BID) | ORAL | 3 refills | Status: DC

## 2019-06-01 NOTE — Assessment & Plan Note (Signed)
Pancreatic cyst seen on CT scan in the ED Will refer to GI for further evaluation

## 2019-06-01 NOTE — Assessment & Plan Note (Signed)
GERD not ideally controlled Start Nexium twice daily AC-this is covered by his insurance Pepcid 40 mg at bedtime Will refer to GI

## 2019-06-01 NOTE — Telephone Encounter (Signed)
Xolair Prefilled Syringe Received:  150mg  Prefilled Syringe >> quantity #4, lot # [id number], exp date 09/2019 75mg  Prefilled Syringe >> N/A Medication arrival date: 06/01/2019 Received by: Desmond Dike, [place]

## 2019-06-01 NOTE — Patient Instructions (Addendum)
Take pepcid 40 mg at bedtime.   Start nexium 40 mg twice daily - take 30 minutes prior to meals   A referral was ordered for GI.

## 2019-06-02 DIAGNOSIS — D509 Iron deficiency anemia, unspecified: Secondary | ICD-10-CM | POA: Diagnosis not present

## 2019-06-02 DIAGNOSIS — N2581 Secondary hyperparathyroidism of renal origin: Secondary | ICD-10-CM | POA: Diagnosis not present

## 2019-06-02 DIAGNOSIS — N186 End stage renal disease: Secondary | ICD-10-CM | POA: Diagnosis not present

## 2019-06-02 DIAGNOSIS — E1129 Type 2 diabetes mellitus with other diabetic kidney complication: Secondary | ICD-10-CM | POA: Diagnosis not present

## 2019-06-02 DIAGNOSIS — D631 Anemia in chronic kidney disease: Secondary | ICD-10-CM | POA: Diagnosis not present

## 2019-06-02 DIAGNOSIS — Z992 Dependence on renal dialysis: Secondary | ICD-10-CM | POA: Diagnosis not present

## 2019-06-04 DIAGNOSIS — D631 Anemia in chronic kidney disease: Secondary | ICD-10-CM | POA: Diagnosis not present

## 2019-06-04 DIAGNOSIS — N2581 Secondary hyperparathyroidism of renal origin: Secondary | ICD-10-CM | POA: Diagnosis not present

## 2019-06-04 DIAGNOSIS — N186 End stage renal disease: Secondary | ICD-10-CM | POA: Diagnosis not present

## 2019-06-04 DIAGNOSIS — E1129 Type 2 diabetes mellitus with other diabetic kidney complication: Secondary | ICD-10-CM | POA: Diagnosis not present

## 2019-06-04 DIAGNOSIS — Z992 Dependence on renal dialysis: Secondary | ICD-10-CM | POA: Diagnosis not present

## 2019-06-04 DIAGNOSIS — D509 Iron deficiency anemia, unspecified: Secondary | ICD-10-CM | POA: Diagnosis not present

## 2019-06-04 NOTE — Progress Notes (Signed)
Remote ICD transmission.   

## 2019-06-06 DIAGNOSIS — Z992 Dependence on renal dialysis: Secondary | ICD-10-CM | POA: Diagnosis not present

## 2019-06-06 DIAGNOSIS — I129 Hypertensive chronic kidney disease with stage 1 through stage 4 chronic kidney disease, or unspecified chronic kidney disease: Secondary | ICD-10-CM | POA: Diagnosis not present

## 2019-06-06 DIAGNOSIS — N186 End stage renal disease: Secondary | ICD-10-CM | POA: Diagnosis not present

## 2019-06-07 DIAGNOSIS — D631 Anemia in chronic kidney disease: Secondary | ICD-10-CM | POA: Diagnosis not present

## 2019-06-07 DIAGNOSIS — Z992 Dependence on renal dialysis: Secondary | ICD-10-CM | POA: Diagnosis not present

## 2019-06-07 DIAGNOSIS — N2581 Secondary hyperparathyroidism of renal origin: Secondary | ICD-10-CM | POA: Diagnosis not present

## 2019-06-07 DIAGNOSIS — D509 Iron deficiency anemia, unspecified: Secondary | ICD-10-CM | POA: Diagnosis not present

## 2019-06-07 DIAGNOSIS — N186 End stage renal disease: Secondary | ICD-10-CM | POA: Diagnosis not present

## 2019-06-07 DIAGNOSIS — E1129 Type 2 diabetes mellitus with other diabetic kidney complication: Secondary | ICD-10-CM | POA: Diagnosis not present

## 2019-06-08 DIAGNOSIS — Z20828 Contact with and (suspected) exposure to other viral communicable diseases: Secondary | ICD-10-CM | POA: Diagnosis not present

## 2019-06-09 ENCOUNTER — Emergency Department (HOSPITAL_COMMUNITY)
Admission: EM | Admit: 2019-06-09 | Discharge: 2019-06-09 | Disposition: A | Discharge status: Home / Self Care | Payer: Medicare Other | Attending: Emergency Medicine | Admitting: Emergency Medicine

## 2019-06-09 ENCOUNTER — Emergency Department (HOSPITAL_COMMUNITY): Payer: Medicare Other

## 2019-06-09 ENCOUNTER — Other Ambulatory Visit: Payer: Self-pay

## 2019-06-09 ENCOUNTER — Encounter (HOSPITAL_COMMUNITY): Payer: Self-pay

## 2019-06-09 DIAGNOSIS — Y92009 Unspecified place in unspecified non-institutional (private) residence as the place of occurrence of the external cause: Secondary | ICD-10-CM

## 2019-06-09 DIAGNOSIS — Z79899 Other long term (current) drug therapy: Secondary | ICD-10-CM | POA: Diagnosis not present

## 2019-06-09 DIAGNOSIS — I509 Heart failure, unspecified: Secondary | ICD-10-CM | POA: Diagnosis not present

## 2019-06-09 DIAGNOSIS — Y92012 Bathroom of single-family (private) house as the place of occurrence of the external cause: Secondary | ICD-10-CM | POA: Insufficient documentation

## 2019-06-09 DIAGNOSIS — N186 End stage renal disease: Secondary | ICD-10-CM | POA: Insufficient documentation

## 2019-06-09 DIAGNOSIS — Y999 Unspecified external cause status: Secondary | ICD-10-CM | POA: Diagnosis not present

## 2019-06-09 DIAGNOSIS — Z7982 Long term (current) use of aspirin: Secondary | ICD-10-CM | POA: Insufficient documentation

## 2019-06-09 DIAGNOSIS — R0902 Hypoxemia: Secondary | ICD-10-CM | POA: Diagnosis not present

## 2019-06-09 DIAGNOSIS — I132 Hypertensive heart and chronic kidney disease with heart failure and with stage 5 chronic kidney disease, or end stage renal disease: Secondary | ICD-10-CM | POA: Insufficient documentation

## 2019-06-09 DIAGNOSIS — Y939 Activity, unspecified: Secondary | ICD-10-CM | POA: Insufficient documentation

## 2019-06-09 DIAGNOSIS — D631 Anemia in chronic kidney disease: Secondary | ICD-10-CM | POA: Diagnosis not present

## 2019-06-09 DIAGNOSIS — E1129 Type 2 diabetes mellitus with other diabetic kidney complication: Secondary | ICD-10-CM | POA: Diagnosis not present

## 2019-06-09 DIAGNOSIS — N2581 Secondary hyperparathyroidism of renal origin: Secondary | ICD-10-CM | POA: Diagnosis not present

## 2019-06-09 DIAGNOSIS — S4992XA Unspecified injury of left shoulder and upper arm, initial encounter: Secondary | ICD-10-CM | POA: Insufficient documentation

## 2019-06-09 DIAGNOSIS — Z7901 Long term (current) use of anticoagulants: Secondary | ICD-10-CM | POA: Diagnosis not present

## 2019-06-09 DIAGNOSIS — Z87891 Personal history of nicotine dependence: Secondary | ICD-10-CM | POA: Diagnosis not present

## 2019-06-09 DIAGNOSIS — S199XXA Unspecified injury of neck, initial encounter: Secondary | ICD-10-CM | POA: Diagnosis not present

## 2019-06-09 DIAGNOSIS — S0181XA Laceration without foreign body of other part of head, initial encounter: Secondary | ICD-10-CM | POA: Insufficient documentation

## 2019-06-09 DIAGNOSIS — S0990XA Unspecified injury of head, initial encounter: Secondary | ICD-10-CM

## 2019-06-09 DIAGNOSIS — D509 Iron deficiency anemia, unspecified: Secondary | ICD-10-CM | POA: Diagnosis not present

## 2019-06-09 DIAGNOSIS — S0003XA Contusion of scalp, initial encounter: Secondary | ICD-10-CM | POA: Diagnosis not present

## 2019-06-09 DIAGNOSIS — S40012A Contusion of left shoulder, initial encounter: Secondary | ICD-10-CM | POA: Diagnosis not present

## 2019-06-09 DIAGNOSIS — E1122 Type 2 diabetes mellitus with diabetic chronic kidney disease: Secondary | ICD-10-CM | POA: Insufficient documentation

## 2019-06-09 DIAGNOSIS — R58 Hemorrhage, not elsewhere classified: Secondary | ICD-10-CM | POA: Diagnosis not present

## 2019-06-09 DIAGNOSIS — Z992 Dependence on renal dialysis: Secondary | ICD-10-CM | POA: Diagnosis not present

## 2019-06-09 DIAGNOSIS — W19XXXA Unspecified fall, initial encounter: Secondary | ICD-10-CM | POA: Insufficient documentation

## 2019-06-09 DIAGNOSIS — S40212A Abrasion of left shoulder, initial encounter: Secondary | ICD-10-CM | POA: Diagnosis not present

## 2019-06-09 NOTE — ED Triage Notes (Signed)
Pt arrived via [facility] c/o injury to head after experiencing a ground level fall in the bathroom at home without loss of conciousness. Avulsions noted by EMS to head and proximal to the left shoulder, hemorrhaging controlled. VS: BP 130/98, HR 88, SPO2 93% RA. Pt is on anticoagulants. A&Ox4.

## 2019-06-09 NOTE — Discharge Instructions (Addendum)
You CT scan and x-rays did not reveal an new traumatic findings.  The CT scan did show old cortical infarcts (old stroke).  You can discuss this with your doctor.

## 2019-06-09 NOTE — ED Notes (Signed)
Patient transported to CT 

## 2019-06-09 NOTE — ED Provider Notes (Signed)
[facility] Provider Note   CSN: [id number] Arrival date & time: 06/09/19  0174     History   Chief Complaint Chief Complaint  Patient presents with  . Fall  . Head Injury    HPI Shane [name] is a 85 y.o. male.     Patient presents to the emergency department with a chief complaint of fall.  He states that he was coming back from the bathroom with his walker, and got caught up on his walker and stumbled and fell to the ground striking his head.  He also injured his left shoulder.  He denies any loss of consciousness.  Denies any neck pain.  Denies any other injuries.  He takes aspirin and Plavix.  He is not otherwise anticoagulated.  Denies any numbness or weakness or tingling.  The history is provided by the patient. No language interpreter was used.    Past Medical History:  Diagnosis Date  . Abnormal nuclear cardiac imaging test Nov 2010   moderate area of infarct in the inferior wall with only minimal reversibility and EF of 28%  . AICD (automatic cardioverter/defibrillator) present   . Allergic rhinitis 01-08-13   Uses nebulizer for chronic sinus issues and Mucinex.  . Arthritis    osteoarthritis   . Asthma    Extrinic  . Blood transfusion    ? at time of bypass surgery   . BPH (benign prostatic hyperplasia)   . Cellulitis of arm, left    MSSA  . CHF (congestive heart failure) ([place])   . Colon polyps    adenomatous  . Coronary artery disease    remote CABG in 1985, cath in 2003 by Dr. Lia Foyer with no PCI, last nuclear in 2010 showing scar and EF of 28%.   . Diabetes mellitus   . Diabetic neuropathy ([place]) 04/25/2017  . Dyslipidemia   . ESRD (end stage renal disease) ([place])   . Gait abnormality 04/25/2017  . Glaucoma 01-08-13   tx. eye drops  . Hemodialysis patient ([place])   . HOH (hard of hearing)   . HTN (hypertension)   . Hypercholesterolemia   . Left ventricular dysfunction    28% per nuclear in 2010 and 35 to 40% per  echo in 2010  . Myocardial infarction ([place])    1985  . Neuromuscular disorder ([place])    legs mild paralysis-able to walk"nerve damage"-legs- left leg brace   . Neuropathy   . Pneumonia    hx of several times years ago   . PVD (peripheral vascular disease) ([place])    a. s/p PTA/stenting of L CIA 02/2019.  Marland Kitchen Renal artery stenosis ([facility])    a. moderate bilateral by angio 02/2019.  Marland Kitchen Spinal stenosis     Patient Active Problem List   Diagnosis Date Noted  . Pancreatic cyst 06/01/2019  . Healthcare maintenance 04/08/2019  . GERD (gastroesophageal reflux disease) 04/08/2019  . ESRD (end stage renal disease) ([place]) 02/05/2019  . Claudication in peripheral vascular disease ([place]) 02/04/2019  . Peripheral arterial disease ([place]) 01/21/2019  . Pneumonia of both lower lobes due to infectious organism 12/17/2018  . Shortness of breath 12/03/2018  . Bronchitis 10/13/2018  . Insomnia 05/27/2018  . ICD (implantable cardioverter-defibrillator) discharge 08/22/2017  . Ventricular tachycardia ([place]) 08/22/2017  . Gout 07/21/2017  . Diabetic neuropathy ([place]) 04/25/2017  . Gait abnormality 04/25/2017  . Numbness and tingling of both feet 03/04/2017  . Peripheral neuropathy 03/04/2017  . Carotid artery disease ([place])  01/09/2017  . Chronic combined systolic and diastolic CHF (congestive heart failure) ([place]) 07/01/2016  . Restrictive lung disease 01/22/2016  . Severe obstructive sleep apnea 12/17/2015  . 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 LBBB (left bundle branch block) 10/10/2014  . Syncope 10/06/2014  . Sinusitis, chronic 07/16/2013  . Asthma, moderate persistent 07/08/2013  . Benign prostatic hyperplasia without urinary obstruction 12/18/2011  . CAD s/p coronary arthery bypass graft   . H/O Spinal stenosis   . DM (diabetes mellitus), type 2 with renal complications ([place]) 46/96/2952  . Benign hypertensive heart disease without heart failure 11/07/2010  . Allergic rhinitis 11/07/2010  . Osteoarthritis  11/07/2010  . Ischemic cardiomyopathy   . Hypercholesterolemia     Past Surgical History:  Procedure Laterality Date  . A/V FISTULAGRAM Right 10/17/2016   Procedure: A/V Fistulagram;  Surgeon: Conrad [place], MD;  Location: [facility];  Service: Cardiovascular;  Laterality: Right;  . ABDOMINAL AORTOGRAM N/A 02/04/2019   Procedure: ABDOMINAL AORTOGRAM;  Surgeon: Lorretta Harp, MD;  Location: [facility];  Service: Cardiovascular;  Laterality: N/A;  . BACK SURGERY     hx of back surgery x 4   . BASCILIC VEIN TRANSPOSITION Right 09/09/2016   Procedure: BASCILIC VEIN TRANSPOSITION Right Arm;  Surgeon: Rosetta Posner, MD;  Location: [facility] OR;  Service: Vascular;  Laterality: Right;  . BI-VENTRICULAR IMPLANTABLE CARDIOVERTER DEFIBRILLATOR N/A 10/10/2014   Procedure: BI-VENTRICULAR IMPLANTABLE CARDIOVERTER DEFIBRILLATOR  (CRT-D);  Surgeon: [name], MD;  Location: [facility] CATH LAB;  Service: Cardiovascular;  Laterality: N/A;  . CARDIAC CATHETERIZATION  2003  . Cataract      recent cataract surgery 6'14  . COLONOSCOPY N/A 01/25/2013   Procedure: COLONOSCOPY;  Surgeon: Irene Shipper, MD;  Location: [facility];  Service: Endoscopy;  Laterality: N/A;  . CORONARY ARTERY BYPASS GRAFT  1985   x 5 vessels  . LOWER EXTREMITY ANGIOGRAPHY Bilateral 02/04/2019   Procedure: Lower Extremity Angiography;  Surgeon: Lorretta Harp, MD;  Location: [facility];  Service: Cardiovascular;  Laterality: Bilateral;  limited study to iliacs  . LUMBAR DISC SURGERY    . PERIPHERAL VASCULAR BALLOON ANGIOPLASTY Right 10/17/2016   Procedure: Peripheral Vascular Balloon Angioplasty;  Surgeon: Conrad Richgrove, MD;  Location: [facility];  Service: Cardiovascular;  Laterality: Right;  ARM VEINOUS AND CENTRAL VEIN  . PERIPHERAL VASCULAR INTERVENTION Left 02/04/2019   Procedure: PERIPHERAL VASCULAR INTERVENTION;  Surgeon: Lorretta Harp, MD;  Location: [facility];  Service: Cardiovascular;  Laterality:  Left;  common iliac  . PR POLYSOM 6/>YRS SLEEP 4/> ADDL PARAM ATTND  12/07/2015  . PROSTATE SURGERY    . TOTAL KNEE ARTHROPLASTY  08/01/2011   Procedure: TOTAL KNEE ARTHROPLASTY;  Surgeon: Johnn Hai;  Location: [facility] ORS;  Service: Orthopedics;  Laterality: Right;        Home Medications    Prior to Admission medications   Medication Sig Start Date End Date Taking? Authorizing Provider  albuterol (PROVENTIL) (2.5 MG/3ML) 0.083% nebulizer solution Take 3 mLs (2.5 mg total) by nebulization every 6 (six) hours as needed for wheezing or shortness of breath. 12/29/18   Magdalen Spatz, NP  Albuterol Sulfate (PROAIR RESPICLICK) 841 (90 Base) MCG/ACT AEPB Inhale 1-2 puffs into the lungs every 6 (six) hours as needed. Patient taking differently: Inhale 1-2 puffs into the lungs every 6 (six) hours as needed (shortness of breath).  04/08/18   Juanito Doom, MD  allopurinol (ZYLOPRIM) 100  MG tablet TAKE 1 TABLET DAILY Patient taking differently: Take 100 mg by mouth daily.  02/16/19   [name], MD  amiodarone (PACERONE) 200 MG tablet TAKE 1 TABLET DAILY Patient taking differently: Take 200 mg by mouth daily.  05/12/19   [name], MD  aspirin EC 81 MG tablet Take 81 mg by mouth at bedtime.     [provider]  atorvastatin (LIPITOR) 80 MG tablet Take 1 tablet (80 mg total) by mouth every evening. Please make overdue appt with Dr. Acie Fredrickson before anymore refills. 1st attempt 06/01/19   [name], MD  calcitRIOL (ROCALTROL) 0.25 MCG capsule Take 1 capsule (0.25 mcg total) by mouth every Monday, Wednesday, and Friday with hemodialysis. 12/19/17   Georgette Shell, MD  calcium carbonate (TUMS) 500 MG chewable tablet Chew 1 tablet by mouth 2 (two) times daily as needed for indigestion or heartburn.    [provider]  cetirizine (ZYRTEC) 10 MG tablet Take 10 mg by mouth at bedtime as needed (seasonal allergies).     [provider]  clopidogrel (PLAVIX) 75 MG  tablet Take 1 tablet (75 mg total) by mouth daily. 02/05/19   Dunn, Nedra Hai, PA-C  clotrimazole-betamethasone (LOTRISONE) lotion Apply to left foot twice daily 12/15/18   Marzetta Board, DPM  dicyclomine (BENTYL) 10 MG capsule Take 10 mg by mouth 3 (three) times daily as needed for spasms.  05/13/18   [provider]  esomeprazole (NEXIUM) 40 MG capsule Take 1 capsule (40 mg total) by mouth 2 (two) times daily before a meal. 06/01/19   Burns, Claudina Lick, MD  famotidine (PEPCID) 40 MG tablet Take 1 tablet (40 mg total) by mouth at bedtime. 06/01/19   Burns, Claudina Lick, MD  fluticasone furoate-vilanterol (BREO ELLIPTA) 200-25 MCG/INH AEPB Inhale 1 puff into the lungs daily. 12/17/18   Lauraine Rinne, NP  glipiZIDE (GLUCOTROL XL) 5 MG 24 hr tablet Take 5 mg by mouth every evening.  06/23/18   [provider]  multivitamin (RENA-VIT) TABS tablet Take 1 tablet by mouth at bedtime. 12/19/17   Georgette Shell, MD  Omalizumab Arvid Right ) Inject into the skin every 21 ( twenty-one) days.    [provider]  OXYGEN Inhale 2 L into the lungs See admin instructions. Inhale 2 L into lungs during dialysis on Monday, Wednesday and Friday and every night at bedtime    [provider]  polyethylene glycol (MIRALAX / GLYCOLAX) packet Take 17 g by mouth daily as needed for mild constipation. 12/19/17   Georgette Shell, MD  sodium chloride (OCEAN) 0.65 % SOLN nasal spray Place 1 spray into both nostrils as needed for congestion.    [provider]  Syringe/Needle, Disp, (SYRINGE 3CC/22GX1") 22G X 1" 3 ML MISC Use to give injection 07/09/13   [provider]  tadalafil (CIALIS) 20 MG tablet Take 20 mg by mouth daily as needed for erectile dysfunction. Do not exceed 2 doses in 7 days    [provider]  testosterone cypionate (DEPOTESTOTERONE CYPIONATE) 100 MG/ML injection Inject 400 mg into the muscle every 21 ( twenty-one) days. For IM use only    [provider]  zolpidem (AMBIEN) 10 MG tablet TAKE 1/2 (ONE-HALF) TABLET BY MOUTH AT BEDTIME .  TAKE  AN  ADDITIONAL  1/2  TABLET  IF  NEEDED  FOR  SLEEP Patient taking differently: Take 10-15 mg by mouth at bedtime. Take an additional 1/2 tab  if needed to fall asleep. 05/13/19   [name], MD    Family History Family History  Problem Relation Age of Onset  . Heart attack Father   . Heart disease Father   . Colon polyps Father   . Lung cancer Mother   . Diabetes Sister        x 2  . Heart disease Brother        x 2  . Bone cancer Brother   . Lung disease Neg Hx     Social History Social History   Tobacco Use  . Smoking status: Former Smoker    Packs/day: 1.00    Years: 10.00    Pack years: 10.00    Types: Cigarettes, Pipe, Cigars    Start date: 12/02/1949    Quit date: 08/05/1958    Years since quitting: 60.8  . Smokeless tobacco: Never Used  Substance Use Topics  . Alcohol use: No  . Drug use: No     Allergies   Codeine, Dacarbazine, Hydrocodone, Pseudoephedrine, and Azithromycin   Review of Systems Review of Systems  All other systems reviewed and are negative.    Physical Exam Updated Vital Signs BP (!) 156/86 (BP Location: Left Arm)   Pulse 81   Temp 98.1 F (36.7 C) (Oral)   Resp 16   Ht 5\' 9"  (1.753 m)   Wt 92.5 kg   SpO2 98%   BMI 30.13 kg/m   Physical Exam Vitals signs and nursing note reviewed.  Constitutional:      Appearance: He is well-developed.  HENT:     Head: Normocephalic and atraumatic.     Comments: 2 cm laceration to left frontal scalp Eyes:     Conjunctiva/sclera: Conjunctivae normal.  Neck:     Musculoskeletal: Neck supple.  Cardiovascular:     Rate and Rhythm: Normal rate and regular rhythm.     Heart sounds: No murmur.  Pulmonary:     Effort: Pulmonary effort is normal. No respiratory distress.     Breath sounds: Normal breath sounds.  Abdominal:     Palpations: Abdomen is soft.     Tenderness: There is no  abdominal tenderness.  Musculoskeletal: Normal range of motion.  Skin:    General: Skin is warm and dry.     Comments: Abrasion and contusion to left shoulder  Neurological:     Mental Status: He is alert and oriented to person, place, and time.  Psychiatric:        Mood and Affect: Mood normal.        Behavior: Behavior normal.      ED Treatments / Results  Labs (all labs ordered are listed, but only abnormal results are displayed) Labs Reviewed - No data to display  EKG None  Radiology Ct Head Wo Contrast  Result Date: 06/09/2019 CLINICAL DATA:  Minor head trauma.  Fall at home EXAM: CT HEAD WITHOUT CONTRAST CT CERVICAL SPINE WITHOUT CONTRAST TECHNIQUE: Multidetector CT imaging of the head and cervical spine was performed following the standard protocol without intravenous contrast. Multiplanar CT image reconstructions of the cervical spine were also generated. COMPARISON:  None. FINDINGS: CT HEAD FINDINGS Brain: No acute hemorrhage, hydrocephalus, mass, or collection. Small remote cortically based infarcts in the right occipital, right parietal, and left anterior frontal lobes. Mild for age cerebral volume loss. Vascular: No hyperdense vessel or unexpected calcification. Skull: Left frontal and temporal scalp contusion without acute fracture. A lucency through the left zygomatic arch is at  the suture and is symmetric to the right. Sinuses/Orbits: Bilateral cataract resection CT CERVICAL SPINE FINDINGS Alignment: No traumatic malalignment. Degenerative levoscoliosis and C3-4, C7-T1 anterolisthesis Skull base and vertebrae: No acute fracture Soft tissues and spinal canal: No prevertebral fluid or swelling. No visible canal hematoma. Disc levels: Advanced degenerative disc narrowing with endplate spurring. Multilevel facet spurring. Upper chest: No acute finding IMPRESSION: 1. No evidence of acute intracranial or cervical spine injury. 2. Left scalp hematoma without calvarial fracture. 3.  Small remote cortical infarcts. Electronically Signed   By: Monte Fantasia M.D.   On: 06/09/2019 06:28   Ct Cervical Spine Wo Contrast  Result Date: 06/09/2019 CLINICAL DATA:  Minor head trauma.  Fall at home EXAM: CT HEAD WITHOUT CONTRAST CT CERVICAL SPINE WITHOUT CONTRAST TECHNIQUE: Multidetector CT imaging of the head and cervical spine was performed following the standard protocol without intravenous contrast. Multiplanar CT image reconstructions of the cervical spine were also generated. COMPARISON:  None. FINDINGS: CT HEAD FINDINGS Brain: No acute hemorrhage, hydrocephalus, mass, or collection. Small remote cortically based infarcts in the right occipital, right parietal, and left anterior frontal lobes. Mild for age cerebral volume loss. Vascular: No hyperdense vessel or unexpected calcification. Skull: Left frontal and temporal scalp contusion without acute fracture. A lucency through the left zygomatic arch is at the suture and is symmetric to the right. Sinuses/Orbits: Bilateral cataract resection CT CERVICAL SPINE FINDINGS Alignment: No traumatic malalignment. Degenerative levoscoliosis and C3-4, C7-T1 anterolisthesis Skull base and vertebrae: No acute fracture Soft tissues and spinal canal: No prevertebral fluid or swelling. No visible canal hematoma. Disc levels: Advanced degenerative disc narrowing with endplate spurring. Multilevel facet spurring. Upper chest: No acute finding IMPRESSION: 1. No evidence of acute intracranial or cervical spine injury. 2. Left scalp hematoma without calvarial fracture. 3. Small remote cortical infarcts. Electronically Signed   By: Monte Fantasia M.D.   On: 06/09/2019 06:28   Dg Shoulder Left  Result Date: 06/09/2019 CLINICAL DATA:  Fall with shoulder injury EXAM: LEFT SHOULDER - 2+ VIEW COMPARISON:  None. FINDINGS: No evidence of fracture or dislocation. Osteopenia. No notable degenerative spurring. Postoperative heart including biventricular pacer.  IMPRESSION: No acute finding Electronically Signed   By: Monte Fantasia M.D.   On: 06/09/2019 06:38    Procedures Procedures (including critical care time) LACERATION REPAIR Performed by: Montine Circle Authorized by: Montine Circle Consent: Verbal consent obtained. Risks and benefits: risks, benefits and alternatives were discussed Consent given by: patient Patient identity confirmed: provided demographic data Prepped and Draped in normal sterile fashion Wound explored  Laceration Location: Left forehead  Laceration Length: 3 cm  No Foreign Bodies seen or palpated  Anesthesia: local infiltration  Local anesthetic: lidocaine 2% with epinephrine  Anesthetic total: 3 ml  Irrigation method: syringe Amount of cleaning: standard  Skin closure: 5-0 Vicryl plus  Number of sutures: 4  Technique: 1 figure-of-eight, 3 simple interrupted  Patient tolerance: Patient tolerated the procedure well with no immediate complications.  Medications Ordered in ED Medications - No data to display   Initial Impression / Assessment and Plan / ED Course  I have reviewed the triage vital signs and the nursing notes.  Pertinent labs & imaging results that were available during my care of the patient were reviewed by me and considered in my medical decision making (see chart for details).        Patient with fall from standing.  He tripped on his walker.  Hit his head and left shoulder.  Will check imaging.  Laceration repaired in the ED.  Anticipate discharge.  CT and imaging negative for acute process.  Old cortical infarct seen.  Discussed with patient.  Advised to follow-up with his PCP.  He is on aspirin and Plavix.  Patient understands and agrees to plan.  Final Clinical Impressions(s) / ED Diagnoses   Final diagnoses:  Injury of head, initial encounter  Fall in home, initial encounter  Injury of left shoulder, initial encounter    ED Discharge Orders    None        Montine Circle, PA-C 06/09/19 0645    Mesner, Corene Cornea, MD 06/09/19 [phone]

## 2019-06-09 NOTE — ED Notes (Signed)
Wounds cleaned and antibiotic ointment applied.

## 2019-06-10 ENCOUNTER — Ambulatory Visit (INDEPENDENT_AMBULATORY_CARE_PROVIDER_SITE_OTHER): Payer: Medicare Other

## 2019-06-10 ENCOUNTER — Ambulatory Visit: Payer: Medicare Other

## 2019-06-10 DIAGNOSIS — J4540 Moderate persistent asthma, uncomplicated: Secondary | ICD-10-CM

## 2019-06-10 DIAGNOSIS — M25562 Pain in left knee: Secondary | ICD-10-CM | POA: Diagnosis not present

## 2019-06-10 DIAGNOSIS — M25512 Pain in left shoulder: Secondary | ICD-10-CM | POA: Diagnosis not present

## 2019-06-10 DIAGNOSIS — S0990XA Unspecified injury of head, initial encounter: Secondary | ICD-10-CM | POA: Diagnosis not present

## 2019-06-10 MED ORDER — OMALIZUMAB 150 MG/ML ~~LOC~~ SOSY
300.0000 mg | PREFILLED_SYRINGE | Freq: Once | SUBCUTANEOUS | Status: AC
  Administered 2019-06-10: 300 mg via SUBCUTANEOUS

## 2019-06-10 NOTE — Progress Notes (Signed)
All questions were answered by the patient before medication was administered. Have you been hospitalized in the last 10 days? No Do you have a fever? No Do you have a cough? No Do you have a headache or sore throat? No  

## 2019-06-11 DIAGNOSIS — N2581 Secondary hyperparathyroidism of renal origin: Secondary | ICD-10-CM | POA: Diagnosis not present

## 2019-06-11 DIAGNOSIS — E1129 Type 2 diabetes mellitus with other diabetic kidney complication: Secondary | ICD-10-CM | POA: Diagnosis not present

## 2019-06-11 DIAGNOSIS — N186 End stage renal disease: Secondary | ICD-10-CM | POA: Diagnosis not present

## 2019-06-11 DIAGNOSIS — D509 Iron deficiency anemia, unspecified: Secondary | ICD-10-CM | POA: Diagnosis not present

## 2019-06-11 DIAGNOSIS — Z992 Dependence on renal dialysis: Secondary | ICD-10-CM | POA: Diagnosis not present

## 2019-06-11 DIAGNOSIS — D631 Anemia in chronic kidney disease: Secondary | ICD-10-CM | POA: Diagnosis not present

## 2019-06-14 ENCOUNTER — Other Ambulatory Visit: Payer: Self-pay | Admitting: Specialist

## 2019-06-14 DIAGNOSIS — D631 Anemia in chronic kidney disease: Secondary | ICD-10-CM | POA: Diagnosis not present

## 2019-06-14 DIAGNOSIS — E1129 Type 2 diabetes mellitus with other diabetic kidney complication: Secondary | ICD-10-CM | POA: Diagnosis not present

## 2019-06-14 DIAGNOSIS — Z992 Dependence on renal dialysis: Secondary | ICD-10-CM | POA: Diagnosis not present

## 2019-06-14 DIAGNOSIS — D509 Iron deficiency anemia, unspecified: Secondary | ICD-10-CM | POA: Diagnosis not present

## 2019-06-14 DIAGNOSIS — M25512 Pain in left shoulder: Secondary | ICD-10-CM

## 2019-06-14 DIAGNOSIS — N2581 Secondary hyperparathyroidism of renal origin: Secondary | ICD-10-CM | POA: Diagnosis not present

## 2019-06-14 DIAGNOSIS — N186 End stage renal disease: Secondary | ICD-10-CM | POA: Diagnosis not present

## 2019-06-15 ENCOUNTER — Other Ambulatory Visit: Payer: Self-pay | Admitting: Specialist

## 2019-06-15 ENCOUNTER — Ambulatory Visit: Payer: Medicare Other | Admitting: Nurse Practitioner

## 2019-06-15 DIAGNOSIS — M25512 Pain in left shoulder: Secondary | ICD-10-CM

## 2019-06-15 DIAGNOSIS — M25612 Stiffness of left shoulder, not elsewhere classified: Secondary | ICD-10-CM

## 2019-06-16 DIAGNOSIS — D631 Anemia in chronic kidney disease: Secondary | ICD-10-CM | POA: Diagnosis not present

## 2019-06-16 DIAGNOSIS — D509 Iron deficiency anemia, unspecified: Secondary | ICD-10-CM | POA: Diagnosis not present

## 2019-06-16 DIAGNOSIS — N2581 Secondary hyperparathyroidism of renal origin: Secondary | ICD-10-CM | POA: Diagnosis not present

## 2019-06-16 DIAGNOSIS — E1129 Type 2 diabetes mellitus with other diabetic kidney complication: Secondary | ICD-10-CM | POA: Diagnosis not present

## 2019-06-16 DIAGNOSIS — Z992 Dependence on renal dialysis: Secondary | ICD-10-CM | POA: Diagnosis not present

## 2019-06-16 DIAGNOSIS — N186 End stage renal disease: Secondary | ICD-10-CM | POA: Diagnosis not present

## 2019-06-18 DIAGNOSIS — Z992 Dependence on renal dialysis: Secondary | ICD-10-CM | POA: Diagnosis not present

## 2019-06-18 DIAGNOSIS — D631 Anemia in chronic kidney disease: Secondary | ICD-10-CM | POA: Diagnosis not present

## 2019-06-18 DIAGNOSIS — N186 End stage renal disease: Secondary | ICD-10-CM | POA: Diagnosis not present

## 2019-06-18 DIAGNOSIS — N2581 Secondary hyperparathyroidism of renal origin: Secondary | ICD-10-CM | POA: Diagnosis not present

## 2019-06-18 DIAGNOSIS — D509 Iron deficiency anemia, unspecified: Secondary | ICD-10-CM | POA: Diagnosis not present

## 2019-06-18 DIAGNOSIS — E1129 Type 2 diabetes mellitus with other diabetic kidney complication: Secondary | ICD-10-CM | POA: Diagnosis not present

## 2019-06-21 DIAGNOSIS — D631 Anemia in chronic kidney disease: Secondary | ICD-10-CM | POA: Diagnosis not present

## 2019-06-21 DIAGNOSIS — Z992 Dependence on renal dialysis: Secondary | ICD-10-CM | POA: Diagnosis not present

## 2019-06-21 DIAGNOSIS — E1129 Type 2 diabetes mellitus with other diabetic kidney complication: Secondary | ICD-10-CM | POA: Diagnosis not present

## 2019-06-21 DIAGNOSIS — N186 End stage renal disease: Secondary | ICD-10-CM | POA: Diagnosis not present

## 2019-06-21 DIAGNOSIS — N2581 Secondary hyperparathyroidism of renal origin: Secondary | ICD-10-CM | POA: Diagnosis not present

## 2019-06-21 DIAGNOSIS — D509 Iron deficiency anemia, unspecified: Secondary | ICD-10-CM | POA: Diagnosis not present

## 2019-06-22 DIAGNOSIS — Z08 Encounter for follow-up examination after completed treatment for malignant neoplasm: Secondary | ICD-10-CM | POA: Diagnosis not present

## 2019-06-22 DIAGNOSIS — Z85828 Personal history of other malignant neoplasm of skin: Secondary | ICD-10-CM | POA: Diagnosis not present

## 2019-06-22 DIAGNOSIS — D225 Melanocytic nevi of trunk: Secondary | ICD-10-CM | POA: Diagnosis not present

## 2019-06-23 DIAGNOSIS — Z992 Dependence on renal dialysis: Secondary | ICD-10-CM | POA: Diagnosis not present

## 2019-06-23 DIAGNOSIS — D509 Iron deficiency anemia, unspecified: Secondary | ICD-10-CM | POA: Diagnosis not present

## 2019-06-23 DIAGNOSIS — N186 End stage renal disease: Secondary | ICD-10-CM | POA: Diagnosis not present

## 2019-06-23 DIAGNOSIS — N2581 Secondary hyperparathyroidism of renal origin: Secondary | ICD-10-CM | POA: Diagnosis not present

## 2019-06-23 DIAGNOSIS — E1129 Type 2 diabetes mellitus with other diabetic kidney complication: Secondary | ICD-10-CM | POA: Diagnosis not present

## 2019-06-23 DIAGNOSIS — D631 Anemia in chronic kidney disease: Secondary | ICD-10-CM | POA: Diagnosis not present

## 2019-06-24 ENCOUNTER — Ambulatory Visit (INDEPENDENT_AMBULATORY_CARE_PROVIDER_SITE_OTHER): Payer: Medicare Other | Admitting: Nurse Practitioner

## 2019-06-24 ENCOUNTER — Encounter: Payer: Self-pay | Admitting: Nurse Practitioner

## 2019-06-24 VITALS — BP 140/78 | HR 102 | Temp 98.2°F | Ht 69.0 in | Wt 205.0 lb

## 2019-06-24 DIAGNOSIS — R140 Abdominal distension (gaseous): Secondary | ICD-10-CM

## 2019-06-24 DIAGNOSIS — K219 Gastro-esophageal reflux disease without esophagitis: Secondary | ICD-10-CM | POA: Diagnosis not present

## 2019-06-24 DIAGNOSIS — R1033 Periumbilical pain: Secondary | ICD-10-CM

## 2019-06-24 DIAGNOSIS — K5900 Constipation, unspecified: Secondary | ICD-10-CM | POA: Diagnosis not present

## 2019-06-24 NOTE — Patient Instructions (Addendum)
If you are age 83 or older, your body mass index should be between 23-30. Your Body mass index is 30.27 kg/m. If this is out of the aforementioned range listed, please consider follow up with your Primary Care Provider.  If you are age 5 or younger, your body mass index should be between 19-25. Your Body mass index is 30.27 kg/m. If this is out of the aformentioned range listed, please consider follow up with your Primary Care Provider.   Continue Nexium 40 mg.  Continue Pepcid 40 mg.  Continue Miralax twice weekly.  Take Fibercon (Capsules) daily. (May take other capsules other than Fibercon.)  Call if not tolerating Fibercon.  Follow up with me in a couple of months.  Call back in a couple of weeks to schedule an appointment as the schedule is not available at this time.  Thank you for choosing me and [facility].   Tye Savoy, NP

## 2019-06-24 NOTE — Progress Notes (Signed)
Referring Provider  Jacinta Shoe, MD Reason for referral: GERD / abdominal pain           ASSESSMENT / PLAN:    85 year old male with pmh significant for ESRD on HD Monday, Wednesday, and Friday, PAD, ischemic cardiomyopathy, ICD.  He is on chronic Plavix. On 02 at home.    1. GERD, bloating, and periumbilical pain since starting dialysis April 2019.  Symptoms have significantly improved with Nexium 40 mg twice daily and Pepcid nightly. --Continue Nexium 40 mg twice daily and Pepcid nightly.  I have asked him to follow-up with me in a couple weeks so we can consider dose reductions --Call me in the interim if symptoms recur/worsen.  2. Constipation.  Taking MiraLAX twice a week.  He does not want to have a bowel movement while at dialysis because it is difficult to get on and off the machine.  MiraLAX helps but has not resolved the constipation --Add FiberCon capsules daily.  No history of diabetic gastroparesis so hopefully will tolerate without bloating.  I chose the route of capsules so that patient does not use his daily water allowance with fiber powder.  3. Pancreatic cystic tail lesion - 1.6 cm.  Surely this is an incidental finding.  Will review films with Dr. Henrene Pastor and discuss need/type of any follow-up.  4. Chronic macrocytic anemia, most likely related to chronic disease.  No overt bleeding.  5. Adenomatous colon polyps, no longer in polyp surveillance program due to age.  HPI:    Chief Complaint:   GERD, abdominal pain and constipation  Patient is an 85 year old male with PMH significant for ESRD on HD, PAD, ischemic cardiomyopathy, and ICD.  He is on chronic Plavix. On 02 at home. Known remotely to Dr. Henrene Pastor for history of adenomatous colon polyps.  Due to age no longer in polyp surveillance program.  Patient has been having acid reflux, bloating and periumbilical pain since starting dialysis April 2019.  Nephrology started him on 20 mg of Prilosec which helped but did  not resolve any of the issues.  He continued the Prilosec for several months but reached the point where his stomach was killing him.  CTAP with contrast negative for acute abdominal pain.  There was a 1.6 cm cystic lesion in the tail of the pancreas.  Lipase and liver tests are unremarkable.  He has PCP changed him to Nexium 40 mg twice daily and added Pepcid nightly.  Since then, which was about a month ago, patient said he has had great improvement that was advised to still keep this appointment to see if any additional work-up or treatment was needed.  In addition to above, patient has been struggling with constipation since starting dialysis.  He was taking Dulcolax prior to dialysis but then after starting dialysis change to MiraLAX.  He is taking MiraLAX twice a week.  Patient does not want to have a bowel movement while at dialysis because it is difficult to come on and off the machine.   Data Reviewed:   05/30/19 CTAP with contrast for acute abdominal pain -no acute findings.  Mildly enlarged lymph nodes in the left groin near the common femoral artery.  A 1.6 cm cystic lesion in the tail of the pancreas.   05/30/2019 Potassium 3.4 Lipase 10, liver test normal WBC four, hemoglobin 9.9, MCV 100.6   Past Medical History:  Diagnosis Date  . Abnormal nuclear cardiac imaging test Nov 2010   moderate area of infarct  in the inferior wall with only minimal reversibility and EF of 28%  . AICD (automatic cardioverter/defibrillator) present   . Allergic rhinitis 01-08-13   Uses nebulizer for chronic sinus issues and Mucinex.  . Arthritis    osteoarthritis   . Asthma    Extrinic  . Blood transfusion    ? at time of bypass surgery   . BPH (benign prostatic hyperplasia)   . Cellulitis of arm, left    MSSA  . CHF (congestive heart failure) ([place])   . Colon polyps    adenomatous  . Coronary artery disease    remote CABG in 1985, cath in 2003 by Dr. Lia Foyer with no PCI, last nuclear in 2010  showing scar and EF of 28%.   . Diabetes mellitus   . Diabetic neuropathy ([place]) 04/25/2017  . Dyslipidemia   . ESRD (end stage renal disease) ([place])   . Gait abnormality 04/25/2017  . Glaucoma 01-08-13   tx. eye drops  . Hemodialysis patient ([place])   . HOH (hard of hearing)   . HTN (hypertension)   . Hypercholesterolemia   . Left ventricular dysfunction    28% per nuclear in 2010 and 35 to 40% per echo in 2010  . Myocardial infarction ([place])    1985  . Neuromuscular disorder ([place])    legs mild paralysis-able to walk"nerve damage"-legs- left leg brace   . Neuropathy   . Pneumonia    hx of several times years ago   . PVD (peripheral vascular disease) ([place])    a. s/p PTA/stenting of L CIA 02/2019.  Marland Kitchen Renal artery stenosis ([facility])    a. moderate bilateral by angio 02/2019.  Marland Kitchen Spinal stenosis      Past Surgical History:  Procedure Laterality Date  . A/V FISTULAGRAM Right 10/17/2016   Procedure: A/V Fistulagram;  Surgeon: Conrad Penelope, MD;  Location: [facility];  Service: Cardiovascular;  Laterality: Right;  . ABDOMINAL AORTOGRAM N/A 02/04/2019   Procedure: ABDOMINAL AORTOGRAM;  Surgeon: Lorretta Harp, MD;  Location: [facility];  Service: Cardiovascular;  Laterality: N/A;  . BACK SURGERY     hx of back surgery x 4   . BASCILIC VEIN TRANSPOSITION Right 09/09/2016   Procedure: BASCILIC VEIN TRANSPOSITION Right Arm;  Surgeon: Rosetta Posner, MD;  [facility] OR;  Service: Vascular;  Laterality: Right;  . BI-VENTRICULAR IMPLANTABLE CARDIOVERTER DEFIBRILLATOR N/A 10/10/2014   Procedure: BI-VENTRICULAR IMPLANTABLE CARDIOVERTER DEFIBRILLATOR  (CRT-D);  Surgeon: [name], MD;  Location: [facility] CATH LAB;  Service: Cardiovascular;  Laterality: N/A;  . CARDIAC CATHETERIZATION  2003  . Cataract      recent cataract surgery 6'14  . COLONOSCOPY N/A 01/25/2013   Procedure: COLONOSCOPY;  Surgeon: Irene Shipper, MD;  Location: [facility];  Service: Endoscopy;  Laterality: N/A;  . CORONARY  ARTERY BYPASS GRAFT  1985   x 5 vessels  . LOWER EXTREMITY ANGIOGRAPHY Bilateral 02/04/2019   Procedure: Lower Extremity Angiography;  Surgeon: Lorretta Harp, MD;  Location: [facility];  Service: Cardiovascular;  Laterality: Bilateral;  limited study to iliacs  . LUMBAR DISC SURGERY    . PERIPHERAL VASCULAR BALLOON ANGIOPLASTY Right 10/17/2016   Procedure: Peripheral Vascular Balloon Angioplasty;  Surgeon: Conrad [place], MD;  Location: [facility];  Service: Cardiovascular;  Laterality: Right;  ARM VEINOUS AND CENTRAL VEIN  . PERIPHERAL VASCULAR INTERVENTION Left 02/04/2019   Procedure: PERIPHERAL VASCULAR INTERVENTION;  Surgeon: Lorretta Harp, MD;  Location:  [facility];  Service: Cardiovascular;  Laterality: Left;  common iliac  . PR POLYSOM 6/>YRS SLEEP 4/> ADDL PARAM ATTND  12/07/2015  . PROSTATE SURGERY    . TOTAL KNEE ARTHROPLASTY  08/01/2011   Procedure: TOTAL KNEE ARTHROPLASTY;  Surgeon: Johnn Hai;  Location: [facility] ORS;  Service: Orthopedics;  Laterality: Right;   Family History  Problem Relation Age of Onset  . Heart attack Father   . Heart disease Father   . Colon polyps Father   . Lung cancer Mother   . Diabetes Sister        x 2  . Heart disease Brother        x 2  . Bone cancer Brother   . Lung disease Neg Hx    Social History   Tobacco Use  . Smoking status: Former Smoker    Packs/day: 1.00    Years: 10.00    Pack years: 10.00    Types: Cigarettes, Pipe, Cigars    Start date: 12/02/1949    Quit date: 08/05/1958    Years since quitting: 60.9  . Smokeless tobacco: Never Used  Substance Use Topics  . Alcohol use: No  . Drug use: No   Current Outpatient Medications  Medication Sig Dispense Refill  . albuterol (PROVENTIL) (2.5 MG/3ML) 0.083% nebulizer solution Take 3 mLs (2.5 mg total) by nebulization every 6 (six) hours as needed for wheezing or shortness of breath. 120 mL 3  . Albuterol Sulfate (PROAIR [place]LICK) 035 (90 Base) MCG/ACT  AEPB Inhale 1-2 puffs into the lungs every 6 (six) hours as needed. (Patient taking differently: Inhale 1-2 puffs into the lungs every 6 (six) hours as needed (shortness of breath). ) 1 each 5  . allopurinol (ZYLOPRIM) 100 MG tablet TAKE 1 TABLET DAILY (Patient taking differently: Take 100 mg by mouth daily. ) 90 tablet 1  . amiodarone (PACERONE) 200 MG tablet TAKE 1 TABLET DAILY (Patient taking differently: Take 200 mg by mouth daily. ) 90 tablet 2  . aspirin EC 81 MG tablet Take 81 mg by mouth at bedtime.     Marland Kitchen atorvastatin (LIPITOR) 80 MG tablet Take 1 tablet (80 mg total) by mouth every evening. Please make overdue appt with Dr. Acie Fredrickson before anymore refills. 1st attempt 90 tablet 3  . calcitRIOL (ROCALTROL) 0.25 MCG capsule Take 1 capsule (0.25 mcg total) by mouth every Monday, Wednesday, and Friday with hemodialysis. 60 capsule 0  . calcium carbonate (TUMS) 500 MG chewable tablet Chew 1 tablet by mouth 2 (two) times daily as needed for indigestion or heartburn.    . cetirizine (ZYRTEC) 10 MG tablet Take 10 mg by mouth at bedtime as needed (seasonal allergies).     . clopidogrel (PLAVIX) 75 MG tablet Take 1 tablet (75 mg total) by mouth daily. 90 tablet 3  . clotrimazole-betamethasone (LOTRISONE) lotion Apply to left foot twice daily 30 mL 1  . dicyclomine (BENTYL) 10 MG capsule Take 10 mg by mouth 3 (three) times daily as needed for spasms.   3  . esomeprazole (NEXIUM) 40 MG capsule Take 1 capsule (40 mg total) by mouth 2 (two) times daily before a meal. 180 capsule 3  . famotidine (PEPCID) 40 MG tablet Take 1 tablet (40 mg total) by mouth at bedtime. 90 tablet 3  . fluticasone furoate-vilanterol (BREO ELLIPTA) 200-25 MCG/INH AEPB Inhale 1 puff into the lungs daily. 180 each 2  . glipiZIDE (GLUCOTROL XL) 5 MG 24 hr tablet Take 5 mg by  mouth every evening.   6  . multivitamin (RENA-VIT) TABS tablet Take 1 tablet by mouth at bedtime. 60 tablet 0  . Omalizumab (XOLAIR [place]) Inject into the skin  every 21 ( twenty-one) days.    . OXYGEN Inhale 2 L into the lungs See admin instructions. Inhale 2 L into lungs during dialysis on Monday, Wednesday and Friday and every night at bedtime    . polyethylene glycol (MIRALAX / GLYCOLAX) packet Take 17 g by mouth daily as needed for mild constipation. 14 each 0  . sodium chloride (OCEAN) 0.65 % SOLN nasal spray Place 1 spray into both nostrils as needed for congestion.    . Syringe/Needle, Disp, (SYRINGE 3CC/22GX1") 22G X 1" 3 ML MISC Use to give injection    . tadalafil (CIALIS) 20 MG tablet Take 20 mg by mouth daily as needed for erectile dysfunction. Do not exceed 2 doses in 7 days    . testosterone cypionate (DEPOTESTOTERONE CYPIONATE) 100 MG/ML injection Inject 400 mg into the muscle every 21 ( twenty-one) days. For IM use only    . zolpidem (AMBIEN) 10 MG tablet TAKE 1/2 (ONE-HALF) TABLET BY MOUTH AT BEDTIME .  TAKE  AN  ADDITIONAL  1/2  TABLET  IF  NEEDED  FOR  SLEEP (Patient taking differently: Take 10-15 mg by mouth at bedtime. Take an additional 1/2 tab if needed to fall asleep.) 30 tablet 0   No current facility-administered medications for this visit.    Allergies  Allergen Reactions  . Codeine Other (See Comments)    anxiety  . Dacarbazine Other (See Comments)  . Hydrocodone Other (See Comments)    Anxiety- can take in liquid form  . Pseudoephedrine Other (See Comments)    Causes heart to race  . Azithromycin Rash     Review of Systems: All systems reviewed and negative except where noted in HPI.   Physical Exam:    Wt Readings from Last 3 Encounters:  06/24/19 205 lb (93 kg)  06/09/19 204 lb (92.5 kg)  06/01/19 215 lb (97.5 kg)    BP 140/78 (BP Location: Left Arm, Patient Position: Sitting)   Pulse (!) 102   Temp 98.2 F (36.8 C)   Ht 5\' 9"  (1.753 m)   Wt 205 lb (93 kg)   SpO2 96%   BMI 30.27 kg/m  Constitutional:  Pleasant male in no acute distress. Psychiatric: Normal mood and affect. Behavior is normal.  EENT: Pupils normal.  Conjunctivae are normal. No scleral icterus. Neck supple.  Cardiovascular: Normal rate, regular rhythm, + murmur. No edema Pulmonary/chest: Effort normal and breath sounds normal. No wheezing, rales or rhonchi. Abdominal: Soft, nondistended, nontender. Bowel sounds active throughout. There are no masses palpable. No hepatomegaly. Neurological: Alert and oriented to person place and time. Skin: Skin is warm and dry. No rashes noted.  Tye Savoy, NP  06/24/2019, 2:43 PM  Cc: [name], MD

## 2019-06-25 ENCOUNTER — Ambulatory Visit
Admission: RE | Admit: 2019-06-25 | Discharge: 2019-06-25 | Disposition: A | Discharge status: Home / Self Care | Payer: Medicare Other | Source: Ambulatory Visit | Attending: Specialist | Admitting: Specialist

## 2019-06-25 DIAGNOSIS — D631 Anemia in chronic kidney disease: Secondary | ICD-10-CM | POA: Diagnosis not present

## 2019-06-25 DIAGNOSIS — Z992 Dependence on renal dialysis: Secondary | ICD-10-CM | POA: Diagnosis not present

## 2019-06-25 DIAGNOSIS — D509 Iron deficiency anemia, unspecified: Secondary | ICD-10-CM | POA: Diagnosis not present

## 2019-06-25 DIAGNOSIS — M25512 Pain in left shoulder: Secondary | ICD-10-CM | POA: Diagnosis not present

## 2019-06-25 DIAGNOSIS — M25612 Stiffness of left shoulder, not elsewhere classified: Secondary | ICD-10-CM

## 2019-06-25 DIAGNOSIS — E1129 Type 2 diabetes mellitus with other diabetic kidney complication: Secondary | ICD-10-CM | POA: Diagnosis not present

## 2019-06-25 DIAGNOSIS — N2581 Secondary hyperparathyroidism of renal origin: Secondary | ICD-10-CM | POA: Diagnosis not present

## 2019-06-25 DIAGNOSIS — N186 End stage renal disease: Secondary | ICD-10-CM | POA: Diagnosis not present

## 2019-06-27 DIAGNOSIS — D509 Iron deficiency anemia, unspecified: Secondary | ICD-10-CM | POA: Diagnosis not present

## 2019-06-27 DIAGNOSIS — D631 Anemia in chronic kidney disease: Secondary | ICD-10-CM | POA: Diagnosis not present

## 2019-06-27 DIAGNOSIS — E1129 Type 2 diabetes mellitus with other diabetic kidney complication: Secondary | ICD-10-CM | POA: Diagnosis not present

## 2019-06-27 DIAGNOSIS — Z992 Dependence on renal dialysis: Secondary | ICD-10-CM | POA: Diagnosis not present

## 2019-06-27 DIAGNOSIS — N186 End stage renal disease: Secondary | ICD-10-CM | POA: Diagnosis not present

## 2019-06-27 DIAGNOSIS — N2581 Secondary hyperparathyroidism of renal origin: Secondary | ICD-10-CM | POA: Diagnosis not present

## 2019-06-29 DIAGNOSIS — D509 Iron deficiency anemia, unspecified: Secondary | ICD-10-CM | POA: Diagnosis not present

## 2019-06-29 DIAGNOSIS — N186 End stage renal disease: Secondary | ICD-10-CM | POA: Diagnosis not present

## 2019-06-29 DIAGNOSIS — E1129 Type 2 diabetes mellitus with other diabetic kidney complication: Secondary | ICD-10-CM | POA: Diagnosis not present

## 2019-06-29 DIAGNOSIS — D631 Anemia in chronic kidney disease: Secondary | ICD-10-CM | POA: Diagnosis not present

## 2019-06-29 DIAGNOSIS — Z992 Dependence on renal dialysis: Secondary | ICD-10-CM | POA: Diagnosis not present

## 2019-06-29 DIAGNOSIS — N2581 Secondary hyperparathyroidism of renal origin: Secondary | ICD-10-CM | POA: Diagnosis not present

## 2019-06-30 ENCOUNTER — Encounter: Payer: Self-pay | Admitting: Nurse Practitioner

## 2019-06-30 NOTE — Progress Notes (Signed)
Reviewed. Agree with plans. No follow up of small pancreatic cyst recommended given age and significant comorbidities.

## 2019-07-02 DIAGNOSIS — E1129 Type 2 diabetes mellitus with other diabetic kidney complication: Secondary | ICD-10-CM | POA: Diagnosis not present

## 2019-07-02 DIAGNOSIS — N186 End stage renal disease: Secondary | ICD-10-CM | POA: Diagnosis not present

## 2019-07-02 DIAGNOSIS — N2581 Secondary hyperparathyroidism of renal origin: Secondary | ICD-10-CM | POA: Diagnosis not present

## 2019-07-02 DIAGNOSIS — D631 Anemia in chronic kidney disease: Secondary | ICD-10-CM | POA: Diagnosis not present

## 2019-07-02 DIAGNOSIS — D509 Iron deficiency anemia, unspecified: Secondary | ICD-10-CM | POA: Diagnosis not present

## 2019-07-02 DIAGNOSIS — Z992 Dependence on renal dialysis: Secondary | ICD-10-CM | POA: Diagnosis not present

## 2019-07-05 DIAGNOSIS — N2581 Secondary hyperparathyroidism of renal origin: Secondary | ICD-10-CM | POA: Diagnosis not present

## 2019-07-05 DIAGNOSIS — D631 Anemia in chronic kidney disease: Secondary | ICD-10-CM | POA: Diagnosis not present

## 2019-07-05 DIAGNOSIS — E1129 Type 2 diabetes mellitus with other diabetic kidney complication: Secondary | ICD-10-CM | POA: Diagnosis not present

## 2019-07-05 DIAGNOSIS — D509 Iron deficiency anemia, unspecified: Secondary | ICD-10-CM | POA: Diagnosis not present

## 2019-07-05 DIAGNOSIS — N186 End stage renal disease: Secondary | ICD-10-CM | POA: Diagnosis not present

## 2019-07-05 DIAGNOSIS — Z992 Dependence on renal dialysis: Secondary | ICD-10-CM | POA: Diagnosis not present

## 2019-07-06 ENCOUNTER — Ambulatory Visit (INDEPENDENT_AMBULATORY_CARE_PROVIDER_SITE_OTHER): Payer: Medicare Other

## 2019-07-06 ENCOUNTER — Other Ambulatory Visit: Payer: Self-pay

## 2019-07-06 DIAGNOSIS — J4540 Moderate persistent asthma, uncomplicated: Secondary | ICD-10-CM

## 2019-07-06 MED ORDER — OMALIZUMAB 150 MG/ML ~~LOC~~ SOSY
300.0000 mg | PREFILLED_SYRINGE | Freq: Once | SUBCUTANEOUS | Status: AC
  Administered 2019-07-06: 300 mg via SUBCUTANEOUS

## 2019-07-06 NOTE — Progress Notes (Signed)
Have you been hospitalized within the last 10 days?  Yes- seen in ED for sutures after a fall.  Do you have a fever?  No Do you have a cough?  No Do you have a headache or sore throat? No Do you have your Epi Pen visible and is it within date?  Yes

## 2019-07-07 ENCOUNTER — Telehealth: Payer: Self-pay | Admitting: Physician Assistant

## 2019-07-07 ENCOUNTER — Other Ambulatory Visit: Payer: Self-pay

## 2019-07-07 MED ORDER — PANTOPRAZOLE SODIUM 40 MG PO TBEC: 40.0000 mg | DELAYED_RELEASE_TABLET | Freq: Every day | ORAL | 2 refills | Status: AC

## 2019-07-07 NOTE — Telephone Encounter (Signed)
Will change him to Protonix.  Beth, will you let patient know that Protonix is same type medication as Nexium and will hopefully control symptoms just as well. Will you send 30 days of the 40 mg tablets. He can have additional two refills. Thanks

## 2019-07-07 NOTE — Telephone Encounter (Signed)
   Received message to inbasket from [facility] about interaction between medication we prescribe, clopidogrel, and PPI prescribed by outside sources -Nexium. Nexium and Prilosec may decrease efficacy of Plavix. Usual alternatives are Protonix, Dexilant and Prevacid. Will forward to GI APP who recently saw patient for review to help guide if one of these PPIs is more appropriate. Thanks! Coran Dipaola PA-C

## 2019-07-08 NOTE — Telephone Encounter (Signed)
Erroneous duplicate  

## 2019-07-08 NOTE — Telephone Encounter (Signed)
Thank you Paula.

## 2019-07-12 ENCOUNTER — Telehealth: Payer: Self-pay | Admitting: Internal Medicine

## 2019-07-12 NOTE — Telephone Encounter (Signed)
Xolair Prefilled Syringe Order: 150mg  Prefilled Syringe:  #4 75mg  Prefilled Syringe: #N/A Ordered Date: 07/12/19 Expected date of arrival: 07/13/19 Ordered by: [place]: Nigel Mormon

## 2019-07-13 ENCOUNTER — Telehealth: Payer: Self-pay

## 2019-07-13 NOTE — Telephone Encounter (Signed)
Xolair Prefilled Syringe Received:  150mg  Prefilled Syringe >> quantity #4, lot # [id number], exp date 03/05/2020 75mg  Prefilled Syringe >> N/A Medication arrival date: 07/13/19 Received by: Clint Biello,LPN

## 2019-07-13 NOTE — Telephone Encounter (Signed)
-----   Message from Willia Craze, NP sent at 07/02/2019  1:15 PM EST ----- Shane [name], please see Dr. Blanch Media comment on my office note. He doesn't feel like the small pancreatic cyst on imaging needs any follow up. Thanks

## 2019-07-13 NOTE — Telephone Encounter (Signed)
Left message to call back to hear recommendations.

## 2019-07-15 DIAGNOSIS — M75122 Complete rotator cuff tear or rupture of left shoulder, not specified as traumatic: Secondary | ICD-10-CM | POA: Diagnosis not present

## 2019-07-15 DIAGNOSIS — M25512 Pain in left shoulder: Secondary | ICD-10-CM | POA: Diagnosis not present

## 2019-07-15 DIAGNOSIS — S42122A Displaced fracture of acromial process, left shoulder, initial encounter for closed fracture: Secondary | ICD-10-CM | POA: Diagnosis not present

## 2019-07-16 ENCOUNTER — Ambulatory Visit (INDEPENDENT_AMBULATORY_CARE_PROVIDER_SITE_OTHER): Payer: Medicare Other | Admitting: Internal Medicine

## 2019-07-16 ENCOUNTER — Other Ambulatory Visit: Payer: Self-pay

## 2019-07-16 ENCOUNTER — Encounter: Payer: Self-pay | Admitting: Internal Medicine

## 2019-07-16 DIAGNOSIS — R296 Repeated falls: Secondary | ICD-10-CM | POA: Diagnosis not present

## 2019-07-16 DIAGNOSIS — R5381 Other malaise: Secondary | ICD-10-CM | POA: Insufficient documentation

## 2019-07-16 DIAGNOSIS — I255 Ischemic cardiomyopathy: Secondary | ICD-10-CM

## 2019-07-16 NOTE — Assessment & Plan Note (Signed)
Physical deconditioning, generalized weakness, recurrent falls and poor balance Has fallen several times in the last few weeks Requesting PT, which I think is a very good idea-ordered We will also order home health aide Currently using a walker

## 2019-07-16 NOTE — Assessment & Plan Note (Signed)
Has had several falls in the past few weeks, 1 of which caused a fracture of his shoulder Uses a walker to ambulate Has poor balance, feet physical deconditioning and arthritis, which are likely all contributing Would benefit from PT-ordered Would benefit from home health aide-ordered Discussed life alert May need to consider out-of-pocket home health aide or even assisted living in future

## 2019-07-16 NOTE — Progress Notes (Signed)
Virtual Visit via Video Note  I connected with Shane [name] on 07/16/19 at  3:45 PM EST by a video enabled telemedicine application and verified that I am speaking with the correct person using two identifiers.   I discussed the limitations of evaluation and management by telemedicine and the availability of in person appointments. The patient expressed understanding and agreed to proceed.  Present for the visit:  Myself, Dr Billey Gosling, Dub Amis.  The patient is currently at home and I am in the office.    No referring provider.    History of Present Illness: This is an acute visit for recurrent falls.   In the past few weeks he has fallen 5 times.  He states his left knee is painful and buckles on him even when he is walking with his walker and he will fall.  One of his falls caused him to break his shoulder and he is following with orthopedics.  He has difficulty getting up out of a chair.  He is concerned because he continues to fall and he is not able to get up and his wife is also falling and she is not able to get up.  They do have some family nearby.  He does use a walker to ambulate.  He has generalized weakness and poor balance.  He wanted to get some physical therapy and some help at home.   Social History   Socioeconomic History  . Marital status: Married    Spouse name: Not on file  . Number of children: 2  . Years of education: Not on file  . Highest education level: Not on file  Occupational History  . Occupation: pastor  Tobacco Use  . Smoking status: Former Smoker    Packs/day: 1.00    Years: 10.00    Pack years: 10.00    Types: Cigarettes, Pipe, Cigars    Start date: 12/02/1949    Quit date: 08/05/1958    Years since quitting: 60.9  . Smokeless tobacco: Never Used  Substance and Sexual Activity  . Alcohol use: No  . Drug use: No  . Sexual activity: Not on file  Other Topics Concern  . Not on file  Social History Narrative   Originally from [place]. He  has always lived in [place]. Prior travel to [place], [place], [place], [place] ,[place], & [place]. 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 Has adopted children. Has a dog currently. No bird exposure. No mold exposure. Enjoys reading & traveling.   Social Determinants of Health   Financial Resource Strain:   . Difficulty of Paying Living Expenses: Not on file  Food Insecurity:   . Worried About Charity fundraiser in the Last Year: Not on file  . Ran Out of Food in the Last Year: Not on file  Transportation Needs:   . Lack of Transportation (Medical): Not on file  . Lack of Transportation (Non-Medical): Not on file  Physical Activity:   . Days of Exercise per Week: Not on file  . Minutes of Exercise per Session: Not on file  Stress:   . Feeling of Stress : Not on file  Social Connections:   . Frequency of Communication with Friends and Family: Not on file  . Frequency of Social Gatherings with Friends and Family: Not on file  . Attends Religious Services: Not on file  . Active Member of Clubs or Organizations: Not on file  . Attends Archivist Meetings: Not  on file  . Marital Status: Not on file     Observations/Objective: Appears well in NAD Breathing normally   Assessment and Plan:  See Problem List for Assessment and Plan of chronic medical problems.   Follow Up Instructions:    I discussed the assessment and treatment plan with the patient. The patient was provided an opportunity to ask questions and all were answered. The patient agreed with the plan and demonstrated an understanding of the instructions.   The patient was advised to call back or seek an in-person evaluation if the symptoms worsen or if the condition fails to improve as anticipated.    [name], MD

## 2019-07-20 ENCOUNTER — Ambulatory Visit (INDEPENDENT_AMBULATORY_CARE_PROVIDER_SITE_OTHER): Payer: Medicare Other

## 2019-07-20 ENCOUNTER — Other Ambulatory Visit: Payer: Self-pay

## 2019-07-20 DIAGNOSIS — J4540 Moderate persistent asthma, uncomplicated: Secondary | ICD-10-CM | POA: Diagnosis not present

## 2019-07-20 MED ORDER — OMALIZUMAB 150 MG/ML ~~LOC~~ SOSY
300.0000 mg | PREFILLED_SYRINGE | Freq: Once | SUBCUTANEOUS | Status: AC
  Administered 2019-07-20: 15:00:00 300 mg via SUBCUTANEOUS

## 2019-07-20 NOTE — Progress Notes (Signed)
All questions were answered by the patient before medication was administered. Have you been hospitalized in the last 10 days? No Do you have a fever? No Do you have a cough? No Do you have a headache or sore throat? No  

## 2019-07-27 DIAGNOSIS — Z87891 Personal history of nicotine dependence: Secondary | ICD-10-CM | POA: Diagnosis not present

## 2019-07-27 DIAGNOSIS — I2510 Atherosclerotic heart disease of native coronary artery without angina pectoris: Secondary | ICD-10-CM | POA: Diagnosis not present

## 2019-07-27 DIAGNOSIS — M545 Low back pain: Secondary | ICD-10-CM | POA: Diagnosis not present

## 2019-07-27 DIAGNOSIS — I110 Hypertensive heart disease with heart failure: Secondary | ICD-10-CM | POA: Diagnosis not present

## 2019-07-27 DIAGNOSIS — I447 Left bundle-branch block, unspecified: Secondary | ICD-10-CM | POA: Diagnosis not present

## 2019-07-27 DIAGNOSIS — E1142 Type 2 diabetes mellitus with diabetic polyneuropathy: Secondary | ICD-10-CM | POA: Diagnosis not present

## 2019-07-27 DIAGNOSIS — M4800 Spinal stenosis, site unspecified: Secondary | ICD-10-CM | POA: Diagnosis not present

## 2019-07-27 DIAGNOSIS — E1122 Type 2 diabetes mellitus with diabetic chronic kidney disease: Secondary | ICD-10-CM | POA: Diagnosis not present

## 2019-07-27 DIAGNOSIS — Z95810 Presence of automatic (implantable) cardiac defibrillator: Secondary | ICD-10-CM | POA: Diagnosis not present

## 2019-07-27 DIAGNOSIS — Z7984 Long term (current) use of oral hypoglycemic drugs: Secondary | ICD-10-CM | POA: Diagnosis not present

## 2019-07-27 DIAGNOSIS — S4292XD Fracture of left shoulder girdle, part unspecified, subsequent encounter for fracture with routine healing: Secondary | ICD-10-CM | POA: Diagnosis not present

## 2019-07-27 DIAGNOSIS — Z7902 Long term (current) use of antithrombotics/antiplatelets: Secondary | ICD-10-CM | POA: Diagnosis not present

## 2019-07-27 DIAGNOSIS — N189 Chronic kidney disease, unspecified: Secondary | ICD-10-CM | POA: Diagnosis not present

## 2019-07-27 DIAGNOSIS — W19XXXD Unspecified fall, subsequent encounter: Secondary | ICD-10-CM | POA: Diagnosis not present

## 2019-07-27 DIAGNOSIS — J4540 Moderate persistent asthma, uncomplicated: Secondary | ICD-10-CM | POA: Diagnosis not present

## 2019-07-27 DIAGNOSIS — I5042 Chronic combined systolic (congestive) and diastolic (congestive) heart failure: Secondary | ICD-10-CM | POA: Diagnosis not present

## 2019-07-27 DIAGNOSIS — M1990 Unspecified osteoarthritis, unspecified site: Secondary | ICD-10-CM | POA: Diagnosis not present

## 2019-07-27 DIAGNOSIS — Z9181 History of falling: Secondary | ICD-10-CM | POA: Diagnosis not present

## 2019-07-27 DIAGNOSIS — I255 Ischemic cardiomyopathy: Secondary | ICD-10-CM | POA: Diagnosis not present

## 2019-07-27 DIAGNOSIS — I472 Ventricular tachycardia: Secondary | ICD-10-CM | POA: Diagnosis not present

## 2019-07-27 DIAGNOSIS — E1151 Type 2 diabetes mellitus with diabetic peripheral angiopathy without gangrene: Secondary | ICD-10-CM | POA: Diagnosis not present

## 2019-07-27 DIAGNOSIS — Z7951 Long term (current) use of inhaled steroids: Secondary | ICD-10-CM | POA: Diagnosis not present

## 2019-07-28 ENCOUNTER — Telehealth: Payer: Self-pay | Admitting: Internal Medicine

## 2019-07-28 NOTE — Telephone Encounter (Signed)
Liji with [facility] calling requesting P Therapy with Freq of 1 wk 1, 2 wk 6, 1 wk 2 for gait and balance-strength   Also HH wants ok on OT in about a week for an evaluation  Must disclose Drug interaction between clopidogrel and efomeprazol  CB for verbal [place]

## 2019-07-28 NOTE — Telephone Encounter (Signed)
No concerns with drug interaction-I believe they mean Plavix and pantoprazole (possibly previously on omeprazole)

## 2019-07-28 NOTE — Telephone Encounter (Signed)
Liji aware.

## 2019-07-28 NOTE — Telephone Encounter (Signed)
Gave ok for orders. Please advise on drug interaction. Just want to make sure it is ok for him to take clopidogrel and efomeprazol. He does not take them at the same time.

## 2019-08-03 ENCOUNTER — Other Ambulatory Visit: Payer: Self-pay

## 2019-08-03 ENCOUNTER — Ambulatory Visit (INDEPENDENT_AMBULATORY_CARE_PROVIDER_SITE_OTHER): Payer: Medicare Other | Admitting: Podiatry

## 2019-08-03 ENCOUNTER — Ambulatory Visit (INDEPENDENT_AMBULATORY_CARE_PROVIDER_SITE_OTHER): Payer: Medicare Other

## 2019-08-03 ENCOUNTER — Encounter: Payer: Self-pay | Admitting: Podiatry

## 2019-08-03 DIAGNOSIS — W19XXXD Unspecified fall, subsequent encounter: Secondary | ICD-10-CM | POA: Diagnosis not present

## 2019-08-03 DIAGNOSIS — T148XXA Other injury of unspecified body region, initial encounter: Secondary | ICD-10-CM | POA: Diagnosis not present

## 2019-08-03 DIAGNOSIS — S92504A Nondisplaced unspecified fracture of right lesser toe(s), initial encounter for closed fracture: Secondary | ICD-10-CM

## 2019-08-03 DIAGNOSIS — I255 Ischemic cardiomyopathy: Secondary | ICD-10-CM | POA: Diagnosis not present

## 2019-08-03 DIAGNOSIS — S99921A Unspecified injury of right foot, initial encounter: Secondary | ICD-10-CM

## 2019-08-03 DIAGNOSIS — M1990 Unspecified osteoarthritis, unspecified site: Secondary | ICD-10-CM | POA: Diagnosis not present

## 2019-08-03 DIAGNOSIS — M4800 Spinal stenosis, site unspecified: Secondary | ICD-10-CM | POA: Diagnosis not present

## 2019-08-03 DIAGNOSIS — E1142 Type 2 diabetes mellitus with diabetic polyneuropathy: Secondary | ICD-10-CM | POA: Diagnosis not present

## 2019-08-03 DIAGNOSIS — M545 Low back pain: Secondary | ICD-10-CM | POA: Diagnosis not present

## 2019-08-03 DIAGNOSIS — S4292XD Fracture of left shoulder girdle, part unspecified, subsequent encounter for fracture with routine healing: Secondary | ICD-10-CM | POA: Diagnosis not present

## 2019-08-03 MED ORDER — DOXYCYCLINE HYCLATE 100 MG PO TABS: 100.0000 mg | ORAL_TABLET | Freq: Two times a day (BID) | ORAL | 0 refills | Status: DC

## 2019-08-03 MED ORDER — MUPIROCIN 2 % EX OINT: 1.0000 "application " | TOPICAL_OINTMENT | Freq: Two times a day (BID) | CUTANEOUS | 2 refills | Status: AC

## 2019-08-04 NOTE — Progress Notes (Signed)
Subjective: 85 year old male presents the office today for concerns of pain to his right fifth toe.  He says he has fallen 7 times in the last 6 weeks because he feels that his knees buckle.  He has a call into his doctor about braces for his knees.  He is using a walker.  He states that about a week ago he fell injuring his right fifth toe has been sore.  Denies any drainage or pus.  No other injury. Denies any systemic complaints such as fevers, chills, nausea, vomiting. No acute changes since last appointment, and no other complaints at this time.   Objective: AAO x3, NAD DP pulses 1/4, PT pulses faintly palpable. On the right fifth toe is a superficial area of skin abrasion from a fall.  No active bleeding or drainage identified there is tenderness palpation of the fifth digit.  No pain to other digits.  Mild discomfort with fifth metatarsal head.  Minimal edema there is no erythema or warmth.  Bruising to the distal tips]. No open lesions or pre-ulcerative lesions.  No pain with calf compression, swelling, warmth, erythema        Assessment: 85 year old male right fifth toe abrasion, injury  Plan: -All treatment options discussed with the patient including all alternatives, risks, complications.  -X-rays obtained and reviewed.  No evidence of osteomyelitis.  Swelling present fifth metatarsal head concern for possible fracture. -Recommended mupirocin ointment dressing changes to the fifth toe daily as well as prescribed doxycycline.  Offloading at all times.  Ideally I would place into a surgical shoe but given his history of falls I do not want to do this.  Monitor closely for any signs or symptoms of infection and report emergency room should any occur. -Patient encouraged to call the office with any questions, concerns, change in symptoms.   RTC 7-10 days or sooner if needed  Trula Slade DPM

## 2019-08-05 DIAGNOSIS — W19XXXD Unspecified fall, subsequent encounter: Secondary | ICD-10-CM | POA: Diagnosis not present

## 2019-08-05 DIAGNOSIS — E1142 Type 2 diabetes mellitus with diabetic polyneuropathy: Secondary | ICD-10-CM | POA: Diagnosis not present

## 2019-08-05 DIAGNOSIS — M1990 Unspecified osteoarthritis, unspecified site: Secondary | ICD-10-CM | POA: Diagnosis not present

## 2019-08-05 DIAGNOSIS — M4800 Spinal stenosis, site unspecified: Secondary | ICD-10-CM | POA: Diagnosis not present

## 2019-08-05 DIAGNOSIS — S4292XD Fracture of left shoulder girdle, part unspecified, subsequent encounter for fracture with routine healing: Secondary | ICD-10-CM | POA: Diagnosis not present

## 2019-08-05 DIAGNOSIS — M545 Low back pain: Secondary | ICD-10-CM | POA: Diagnosis not present

## 2019-08-07 DIAGNOSIS — D509 Iron deficiency anemia, unspecified: Secondary | ICD-10-CM | POA: Diagnosis not present

## 2019-08-07 DIAGNOSIS — Z992 Dependence on renal dialysis: Secondary | ICD-10-CM | POA: Diagnosis not present

## 2019-08-07 DIAGNOSIS — E1129 Type 2 diabetes mellitus with other diabetic kidney complication: Secondary | ICD-10-CM | POA: Diagnosis not present

## 2019-08-07 DIAGNOSIS — N2581 Secondary hyperparathyroidism of renal origin: Secondary | ICD-10-CM | POA: Diagnosis not present

## 2019-08-07 DIAGNOSIS — N186 End stage renal disease: Secondary | ICD-10-CM | POA: Diagnosis not present

## 2019-08-08 ENCOUNTER — Inpatient Hospital Stay (HOSPITAL_COMMUNITY)
Admission: EM | Admit: 2019-08-08 | Discharge: 2019-09-06 | DRG: 291 | Disposition: E | Payer: Medicare Other | Attending: Internal Medicine | Admitting: Internal Medicine

## 2019-08-08 ENCOUNTER — Emergency Department (HOSPITAL_COMMUNITY): Payer: Medicare Other

## 2019-08-08 ENCOUNTER — Encounter (HOSPITAL_COMMUNITY): Payer: Self-pay | Admitting: Emergency Medicine

## 2019-08-08 DIAGNOSIS — R531 Weakness: Secondary | ICD-10-CM | POA: Diagnosis not present

## 2019-08-08 DIAGNOSIS — J45909 Unspecified asthma, uncomplicated: Secondary | ICD-10-CM | POA: Diagnosis present

## 2019-08-08 DIAGNOSIS — R05 Cough: Secondary | ICD-10-CM

## 2019-08-08 DIAGNOSIS — Z801 Family history of malignant neoplasm of trachea, bronchus and lung: Secondary | ICD-10-CM

## 2019-08-08 DIAGNOSIS — Z9981 Dependence on supplemental oxygen: Secondary | ICD-10-CM

## 2019-08-08 DIAGNOSIS — E1122 Type 2 diabetes mellitus with diabetic chronic kidney disease: Secondary | ICD-10-CM | POA: Diagnosis present

## 2019-08-08 DIAGNOSIS — H409 Unspecified glaucoma: Secondary | ICD-10-CM | POA: Diagnosis present

## 2019-08-08 DIAGNOSIS — M1990 Unspecified osteoarthritis, unspecified site: Secondary | ICD-10-CM | POA: Diagnosis present

## 2019-08-08 DIAGNOSIS — J9621 Acute and chronic respiratory failure with hypoxia: Secondary | ICD-10-CM | POA: Diagnosis not present

## 2019-08-08 DIAGNOSIS — I132 Hypertensive heart and chronic kidney disease with heart failure and with stage 5 chronic kidney disease, or end stage renal disease: Secondary | ICD-10-CM | POA: Diagnosis present

## 2019-08-08 DIAGNOSIS — J181 Lobar pneumonia, unspecified organism: Secondary | ICD-10-CM | POA: Diagnosis present

## 2019-08-08 DIAGNOSIS — M25462 Effusion, left knee: Secondary | ICD-10-CM | POA: Diagnosis not present

## 2019-08-08 DIAGNOSIS — I361 Nonrheumatic tricuspid (valve) insufficiency: Secondary | ICD-10-CM | POA: Diagnosis not present

## 2019-08-08 DIAGNOSIS — G4733 Obstructive sleep apnea (adult) (pediatric): Secondary | ICD-10-CM | POA: Diagnosis not present

## 2019-08-08 DIAGNOSIS — R000 Tachycardia, unspecified: Secondary | ICD-10-CM | POA: Diagnosis not present

## 2019-08-08 DIAGNOSIS — Z87891 Personal history of nicotine dependence: Secondary | ICD-10-CM

## 2019-08-08 DIAGNOSIS — Z20822 Contact with and (suspected) exposure to covid-19: Secondary | ICD-10-CM | POA: Diagnosis present

## 2019-08-08 DIAGNOSIS — D61818 Other pancytopenia: Secondary | ICD-10-CM | POA: Diagnosis present

## 2019-08-08 DIAGNOSIS — Z4502 Encounter for adjustment and management of automatic implantable cardiac defibrillator: Secondary | ICD-10-CM

## 2019-08-08 DIAGNOSIS — I5042 Chronic combined systolic (congestive) and diastolic (congestive) heart failure: Secondary | ICD-10-CM | POA: Diagnosis not present

## 2019-08-08 DIAGNOSIS — R0602 Shortness of breath: Secondary | ICD-10-CM | POA: Diagnosis not present

## 2019-08-08 DIAGNOSIS — M009 Pyogenic arthritis, unspecified: Secondary | ICD-10-CM | POA: Diagnosis not present

## 2019-08-08 DIAGNOSIS — S92504D Nondisplaced unspecified fracture of right lesser toe(s), subsequent encounter for fracture with routine healing: Secondary | ICD-10-CM

## 2019-08-08 DIAGNOSIS — K219 Gastro-esophageal reflux disease without esophagitis: Secondary | ICD-10-CM

## 2019-08-08 DIAGNOSIS — A4102 Sepsis due to Methicillin resistant Staphylococcus aureus: Secondary | ICD-10-CM | POA: Diagnosis not present

## 2019-08-08 DIAGNOSIS — Z992 Dependence on renal dialysis: Secondary | ICD-10-CM | POA: Diagnosis not present

## 2019-08-08 DIAGNOSIS — M00062 Staphylococcal arthritis, left knee: Secondary | ICD-10-CM | POA: Diagnosis not present

## 2019-08-08 DIAGNOSIS — Z95810 Presence of automatic (implantable) cardiac defibrillator: Secondary | ICD-10-CM | POA: Diagnosis not present

## 2019-08-08 DIAGNOSIS — R0902 Hypoxemia: Secondary | ICD-10-CM | POA: Diagnosis not present

## 2019-08-08 DIAGNOSIS — M1710 Unilateral primary osteoarthritis, unspecified knee: Secondary | ICD-10-CM | POA: Diagnosis not present

## 2019-08-08 DIAGNOSIS — H9190 Unspecified hearing loss, unspecified ear: Secondary | ICD-10-CM | POA: Diagnosis present

## 2019-08-08 DIAGNOSIS — N2581 Secondary hyperparathyroidism of renal origin: Secondary | ICD-10-CM | POA: Diagnosis present

## 2019-08-08 DIAGNOSIS — M109 Gout, unspecified: Secondary | ICD-10-CM | POA: Diagnosis not present

## 2019-08-08 DIAGNOSIS — I5023 Acute on chronic systolic (congestive) heart failure: Secondary | ICD-10-CM | POA: Diagnosis not present

## 2019-08-08 DIAGNOSIS — D631 Anemia in chronic kidney disease: Secondary | ICD-10-CM | POA: Diagnosis present

## 2019-08-08 DIAGNOSIS — Z96651 Presence of right artificial knee joint: Secondary | ICD-10-CM | POA: Diagnosis not present

## 2019-08-08 DIAGNOSIS — E1140 Type 2 diabetes mellitus with diabetic neuropathy, unspecified: Secondary | ICD-10-CM | POA: Diagnosis present

## 2019-08-08 DIAGNOSIS — I5043 Acute on chronic combined systolic (congestive) and diastolic (congestive) heart failure: Secondary | ICD-10-CM | POA: Diagnosis present

## 2019-08-08 DIAGNOSIS — Z8371 Family history of colonic polyps: Secondary | ICD-10-CM

## 2019-08-08 DIAGNOSIS — Z888 Allergy status to other drugs, medicaments and biological substances status: Secondary | ICD-10-CM

## 2019-08-08 DIAGNOSIS — R7881 Bacteremia: Secondary | ICD-10-CM | POA: Diagnosis not present

## 2019-08-08 DIAGNOSIS — N400 Enlarged prostate without lower urinary tract symptoms: Secondary | ICD-10-CM | POA: Diagnosis present

## 2019-08-08 DIAGNOSIS — E1129 Type 2 diabetes mellitus with other diabetic kidney complication: Secondary | ICD-10-CM | POA: Diagnosis present

## 2019-08-08 DIAGNOSIS — Z8249 Family history of ischemic heart disease and other diseases of the circulatory system: Secondary | ICD-10-CM

## 2019-08-08 DIAGNOSIS — E1151 Type 2 diabetes mellitus with diabetic peripheral angiopathy without gangrene: Secondary | ICD-10-CM | POA: Diagnosis present

## 2019-08-08 DIAGNOSIS — M4800 Spinal stenosis, site unspecified: Secondary | ICD-10-CM | POA: Diagnosis present

## 2019-08-08 DIAGNOSIS — N186 End stage renal disease: Secondary | ICD-10-CM | POA: Diagnosis present

## 2019-08-08 DIAGNOSIS — Z7984 Long term (current) use of oral hypoglycemic drugs: Secondary | ICD-10-CM

## 2019-08-08 DIAGNOSIS — Z833 Family history of diabetes mellitus: Secondary | ICD-10-CM

## 2019-08-08 DIAGNOSIS — B9562 Methicillin resistant Staphylococcus aureus infection as the cause of diseases classified elsewhere: Secondary | ICD-10-CM | POA: Diagnosis not present

## 2019-08-08 DIAGNOSIS — Z209 Contact with and (suspected) exposure to unspecified communicable disease: Secondary | ICD-10-CM | POA: Diagnosis not present

## 2019-08-08 DIAGNOSIS — I248 Other forms of acute ischemic heart disease: Secondary | ICD-10-CM | POA: Diagnosis not present

## 2019-08-08 DIAGNOSIS — I739 Peripheral vascular disease, unspecified: Secondary | ICD-10-CM | POA: Diagnosis not present

## 2019-08-08 DIAGNOSIS — I2510 Atherosclerotic heart disease of native coronary artery without angina pectoris: Secondary | ICD-10-CM | POA: Diagnosis not present

## 2019-08-08 DIAGNOSIS — I4891 Unspecified atrial fibrillation: Secondary | ICD-10-CM | POA: Diagnosis present

## 2019-08-08 DIAGNOSIS — R52 Pain, unspecified: Secondary | ICD-10-CM | POA: Diagnosis not present

## 2019-08-08 DIAGNOSIS — R509 Fever, unspecified: Secondary | ICD-10-CM

## 2019-08-08 DIAGNOSIS — R296 Repeated falls: Secondary | ICD-10-CM | POA: Diagnosis present

## 2019-08-08 DIAGNOSIS — E785 Hyperlipidemia, unspecified: Secondary | ICD-10-CM | POA: Diagnosis present

## 2019-08-08 DIAGNOSIS — R0600 Dyspnea, unspecified: Secondary | ICD-10-CM

## 2019-08-08 DIAGNOSIS — Z951 Presence of aortocoronary bypass graft: Secondary | ICD-10-CM

## 2019-08-08 DIAGNOSIS — Z881 Allergy status to other antibiotic agents status: Secondary | ICD-10-CM

## 2019-08-08 DIAGNOSIS — I447 Left bundle-branch block, unspecified: Secondary | ICD-10-CM | POA: Diagnosis present

## 2019-08-08 DIAGNOSIS — M84412K Pathological fracture, left shoulder, subsequent encounter for fracture with nonunion: Secondary | ICD-10-CM | POA: Diagnosis not present

## 2019-08-08 DIAGNOSIS — I509 Heart failure, unspecified: Secondary | ICD-10-CM

## 2019-08-08 DIAGNOSIS — E7800 Pure hypercholesterolemia, unspecified: Secondary | ICD-10-CM | POA: Diagnosis present

## 2019-08-08 DIAGNOSIS — I340 Nonrheumatic mitral (valve) insufficiency: Secondary | ICD-10-CM | POA: Diagnosis not present

## 2019-08-08 DIAGNOSIS — Z885 Allergy status to narcotic agent status: Secondary | ICD-10-CM

## 2019-08-08 DIAGNOSIS — M25412 Effusion, left shoulder: Secondary | ICD-10-CM | POA: Diagnosis not present

## 2019-08-08 DIAGNOSIS — Z7902 Long term (current) use of antithrombotics/antiplatelets: Secondary | ICD-10-CM

## 2019-08-08 DIAGNOSIS — Z7982 Long term (current) use of aspirin: Secondary | ICD-10-CM

## 2019-08-08 DIAGNOSIS — R059 Cough, unspecified: Secondary | ICD-10-CM

## 2019-08-08 DIAGNOSIS — I255 Ischemic cardiomyopathy: Secondary | ICD-10-CM | POA: Diagnosis present

## 2019-08-08 DIAGNOSIS — R011 Cardiac murmur, unspecified: Secondary | ICD-10-CM | POA: Diagnosis not present

## 2019-08-08 DIAGNOSIS — S4292XD Fracture of left shoulder girdle, part unspecified, subsequent encounter for fracture with routine healing: Secondary | ICD-10-CM

## 2019-08-08 DIAGNOSIS — W19XXXD Unspecified fall, subsequent encounter: Secondary | ICD-10-CM | POA: Diagnosis present

## 2019-08-08 DIAGNOSIS — E11649 Type 2 diabetes mellitus with hypoglycemia without coma: Secondary | ICD-10-CM | POA: Diagnosis not present

## 2019-08-08 DIAGNOSIS — J9600 Acute respiratory failure, unspecified whether with hypoxia or hypercapnia: Secondary | ICD-10-CM | POA: Diagnosis not present

## 2019-08-08 DIAGNOSIS — I252 Old myocardial infarction: Secondary | ICD-10-CM

## 2019-08-08 DIAGNOSIS — I119 Hypertensive heart disease without heart failure: Secondary | ICD-10-CM | POA: Diagnosis present

## 2019-08-08 HISTORY — DX: Repeated falls: R29.6

## 2019-08-08 LAB — HEPATIC FUNCTION PANEL
ALT: 30 U/L (ref 0–44)
AST: 54 U/L — ABNORMAL HIGH (ref 15–41)
Albumin: 3.1 g/dL — ABNORMAL LOW (ref 3.5–5.0)
Alkaline Phosphatase: 110 U/L (ref 38–126)
Bilirubin, Direct: 1 mg/dL — ABNORMAL HIGH (ref 0.0–0.2)
Indirect Bilirubin: 1 mg/dL — ABNORMAL HIGH (ref 0.3–0.9)
Total Bilirubin: 2 mg/dL — ABNORMAL HIGH (ref 0.3–1.2)
Total Protein: 7.2 g/dL (ref 6.5–8.1)

## 2019-08-08 LAB — TRIGLYCERIDES: Triglycerides: 131 mg/dL (ref <150)

## 2019-08-08 LAB — LACTATE DEHYDROGENASE: LDH: 431 U/L — ABNORMAL HIGH (ref 98–192)

## 2019-08-08 LAB — BASIC METABOLIC PANEL
Anion gap: 18 — ABNORMAL HIGH (ref 5–15)
BUN: 41 mg/dL — ABNORMAL HIGH (ref 8–23)
CO2: 24 mmol/L (ref 22–32)
Calcium: 9 mg/dL (ref 8.9–10.3)
Chloride: 93 mmol/L — ABNORMAL LOW (ref 98–111)
Creatinine, Ser: 5.93 mg/dL — ABNORMAL HIGH (ref 0.61–1.24)
GFR calc Af Amer: 9 mL/min — ABNORMAL LOW (ref >60)
GFR calc non Af Amer: 8 mL/min — ABNORMAL LOW (ref >60)
Glucose, Bld: 139 mg/dL — ABNORMAL HIGH (ref 70–99)
Potassium: 4.2 mmol/L (ref 3.5–5.1)
Sodium: 135 mmol/L (ref 135–145)

## 2019-08-08 LAB — I-STAT CHEM 8, ED
BUN: 43 mg/dL — ABNORMAL HIGH (ref 8–23)
Calcium, Ion: 1 mmol/L — ABNORMAL LOW (ref 1.15–1.40)
Chloride: 97 mmol/L — ABNORMAL LOW (ref 98–111)
Creatinine, Ser: 6 mg/dL — ABNORMAL HIGH (ref 0.61–1.24)
Glucose, Bld: 135 mg/dL — ABNORMAL HIGH (ref 70–99)
HCT: 36 % — ABNORMAL LOW (ref 39.0–52.0)
Hemoglobin: 12.2 g/dL — ABNORMAL LOW (ref 13.0–17.0)
Potassium: 4.2 mmol/L (ref 3.5–5.1)
Sodium: 141 mmol/L (ref 135–145)
TCO2: 31 mmol/L (ref 22–32)

## 2019-08-08 LAB — CBC WITH DIFFERENTIAL/PLATELET
Abs Immature Granulocytes: 0.02 10*3/uL (ref 0.00–0.07)
Basophils Absolute: 0 10*3/uL (ref 0.0–0.1)
Basophils Relative: 0 %
Eosinophils Absolute: 0 10*3/uL (ref 0.0–0.5)
Eosinophils Relative: 1 %
HCT: 34.1 % — ABNORMAL LOW (ref 39.0–52.0)
Hemoglobin: 10 g/dL — ABNORMAL LOW (ref 13.0–17.0)
Immature Granulocytes: 1 %
Lymphocytes Relative: 22 %
Lymphs Abs: 0.9 10*3/uL (ref 0.7–4.0)
MCH: 30.9 pg (ref 26.0–34.0)
MCHC: 29.3 g/dL — ABNORMAL LOW (ref 30.0–36.0)
MCV: 105.2 fL — ABNORMAL HIGH (ref 80.0–100.0)
Monocytes Absolute: 0.7 10*3/uL (ref 0.1–1.0)
Monocytes Relative: 15 %
Neutro Abs: 2.6 10*3/uL (ref 1.7–7.7)
Neutrophils Relative %: 61 %
Platelets: 61 10*3/uL — ABNORMAL LOW (ref 150–400)
RBC: 3.24 MIL/uL — ABNORMAL LOW (ref 4.22–5.81)
RDW: 19.7 % — ABNORMAL HIGH (ref 11.5–15.5)
WBC: 4.2 10*3/uL (ref 4.0–10.5)
nRBC: 0 % (ref 0.0–0.2)

## 2019-08-08 LAB — D-DIMER, QUANTITATIVE: D-Dimer, Quant: 7.37 ug/mL-FEU — ABNORMAL HIGH (ref 0.00–0.50)

## 2019-08-08 LAB — FIBRINOGEN: Fibrinogen: 490 mg/dL — ABNORMAL HIGH (ref 210–475)

## 2019-08-08 LAB — TROPONIN I (HIGH SENSITIVITY): Troponin I (High Sensitivity): 75 ng/L — ABNORMAL HIGH (ref <18)

## 2019-08-08 LAB — RESPIRATORY PANEL BY RT PCR (FLU A&B, COVID)
Influenza A by PCR: NEGATIVE
Influenza B by PCR: NEGATIVE
SARS Coronavirus 2 by RT PCR: NEGATIVE

## 2019-08-08 LAB — LACTIC ACID, PLASMA: Lactic Acid, Venous: 3.8 mmol/L (ref 0.5–1.9)

## 2019-08-08 LAB — POC SARS CORONAVIRUS 2 AG -  ED: SARS Coronavirus 2 Ag: NEGATIVE

## 2019-08-08 LAB — PROCALCITONIN: Procalcitonin: 0.44 ng/mL

## 2019-08-08 MED ORDER — IPRATROPIUM BROMIDE HFA 17 MCG/ACT IN AERS
2.0000 | INHALATION_SPRAY | RESPIRATORY_TRACT | Status: AC
  Administered 2019-08-08 (×3): 2 via RESPIRATORY_TRACT
  Filled 2019-08-08: qty 12.9

## 2019-08-08 MED ORDER — ALBUTEROL SULFATE HFA 108 (90 BASE) MCG/ACT IN AERS
4.0000 | INHALATION_SPRAY | RESPIRATORY_TRACT | Status: AC
  Administered 2019-08-08 (×3): 4 via RESPIRATORY_TRACT
  Filled 2019-08-08: qty 6.7

## 2019-08-08 MED ORDER — METHYLPREDNISOLONE SODIUM SUCC 125 MG IJ SOLR
125.0000 mg | Freq: Once | INTRAMUSCULAR | Status: AC
  Administered 2019-08-08: 125 mg via INTRAVENOUS
  Filled 2019-08-08: qty 2

## 2019-08-08 MED ORDER — CHLORHEXIDINE GLUCONATE CLOTH 2 % EX PADS
6.0000 | MEDICATED_PAD | Freq: Every day | CUTANEOUS | Status: DC
  Administered 2019-08-10 – 2019-08-12 (×2): 6 via TOPICAL

## 2019-08-08 NOTE — ED Provider Notes (Signed)
[facility] EMERGENCY DEPARTMENT Provider Note   CSN: [id number] Arrival date & time: 08/09/2019  2020     History Chief Complaint  Patient presents with  . Shortness of Breath    Shane [name] is a 86 y.o. male.  HPI Patient has had increasing shortness of breath.  He identifies it is coming up fairly rapidly.  Patient is moderately confused.  I was able to speak with his wife.  She reports he has been coughing a lot for a couple of days.  She reports he did go to his dialysis on Saturday as scheduled based on the holiday schedule.  He typically is Monday Wednesday Friday.  No documented fever that she is taken at home.  She did note that this evening he did start to become slightly confused as he became more short of breath.  She reports typically he is very sharp and alert and not confused.  Patient is denying any chest pain.    Past Medical History:  Diagnosis Date  . Abnormal nuclear cardiac imaging test Nov 2010   moderate area of infarct in the inferior wall with only minimal reversibility and EF of 28%  . AICD (automatic cardioverter/defibrillator) present   . Allergic rhinitis 01-08-13   Uses nebulizer for chronic sinus issues and Mucinex.  . Arthritis    osteoarthritis   . Asthma    Extrinic  . Blood transfusion    ? at time of bypass surgery   . BPH (benign prostatic hyperplasia)   . Cellulitis of arm, left    MSSA  . CHF (congestive heart failure) ([place])   . Colon polyps    adenomatous  . Coronary artery disease    remote CABG in 1985, cath in 2003 by Dr. Lia Foyer with no PCI, last nuclear in 2010 showing scar and EF of 28%.   . Diabetes mellitus   . Diabetic neuropathy ([place]) 04/25/2017  . Dyslipidemia   . ESRD (end stage renal disease) ([place])   . Gait abnormality 04/25/2017  . Glaucoma 01-08-13   tx. eye drops  . Hemodialysis patient ([place])   . HOH (hard of hearing)   . HTN (hypertension)   . Hypercholesterolemia   . Left ventricular  dysfunction    28% per nuclear in 2010 and 35 to 40% per echo in 2010  . Myocardial infarction ([place])    1985  . Neuromuscular disorder ([place])    legs mild paralysis-able to walk"nerve damage"-legs- left leg brace   . Neuropathy   . Pneumonia    hx of several times years ago   . PVD (peripheral vascular disease) ([place])    a. s/p PTA/stenting of L CIA 02/2019.  Marland Kitchen Renal artery stenosis (H[facility])    a. moderate bilateral by angio 02/2019.  Marland Kitchen Spinal stenosis     Patient Active Problem List   Diagnosis Date Noted  . Recurrent falls 07/16/2019  . Physical deconditioning 07/16/2019  . Pancreatic cyst 06/01/2019  . Healthcare maintenance 04/08/2019  . GERD (gastroesophageal reflux disease) 04/08/2019  . ESRD (end stage renal disease) ([place]) 02/05/2019  . Claudication in peripheral vascular disease ([place]) 02/04/2019  . Peripheral arterial disease ([place]) 01/21/2019  . Pneumonia of both lower lobes due to infectious organism 12/17/2018  . Shortness of breath 12/03/2018  . Bronchitis 10/13/2018  . Insomnia 05/27/2018  . ICD (implantable cardioverter-defibrillator) discharge 08/22/2017  . Ventricular tachycardia ([place]) 08/22/2017  . Gout 07/21/2017  . Diabetic neuropathy ([place]) 04/25/2017  . Gait abnormality  04/25/2017  . Numbness and tingling of both feet 03/04/2017  . Peripheral neuropathy 03/04/2017  . Carotid artery disease ([place]) 01/09/2017  . Chronic combined systolic and diastolic CHF (congestive heart failure) ([place]) 07/01/2016  . Restrictive lung disease 01/22/2016  . Severe obstructive sleep apnea 12/17/2015  . 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 LBBB (left bundle branch block) 10/10/2014  . Syncope 10/06/2014  . Sinusitis, chronic 07/16/2013  . Asthma, moderate persistent 07/08/2013  . Benign prostatic hyperplasia without urinary obstruction 12/18/2011  . CAD s/p coronary arthery bypass graft   . H/O Spinal stenosis   . DM (diabetes mellitus), type 2 with renal complications ([place]) 75/64/3329    . Benign hypertensive heart disease without heart failure 11/07/2010  . Allergic rhinitis 11/07/2010  . Osteoarthritis 11/07/2010  . Ischemic cardiomyopathy   . Hypercholesterolemia     Past Surgical History:  Procedure Laterality Date  . A/V FISTULAGRAM Right 10/17/2016   Procedure: A/V Fistulagram;  Surgeon: Conrad [place], MD;  Location: [facility];  Service: Cardiovascular;  Laterality: Right;  . ABDOMINAL AORTOGRAM N/A 02/04/2019   Procedure: ABDOMINAL AORTOGRAM;  Surgeon: Lorretta Harp, MD;  Location: [facility];  Service: Cardiovascular;  Laterality: N/A;  . BACK SURGERY     hx of back surgery x 4   . BASCILIC VEIN TRANSPOSITION Right 09/09/2016   Procedure: BASCILIC VEIN TRANSPOSITION Right Arm;  Surgeon: Rosetta Posner, MD;  [facility] OR;  Service: Vascular;  Laterality: Right;  . BI-VENTRICULAR IMPLANTABLE CARDIOVERTER DEFIBRILLATOR N/A 10/10/2014   Procedure: BI-VENTRICULAR IMPLANTABLE CARDIOVERTER DEFIBRILLATOR  (CRT-D);  Surgeon: [name], MD;  Location: [facility] CATH LAB;  Service: Cardiovascular;  Laterality: N/A;  . CARDIAC CATHETERIZATION  2003  . Cataract      recent cataract surgery 6'14  . COLONOSCOPY N/A 01/25/2013   Procedure: COLONOSCOPY;  Surgeon: Irene Shipper, MD;  Location: [facility] ENDOSCOPY;  Service: Endoscopy;  Laterality: N/A;  . CORONARY ARTERY BYPASS GRAFT  1985   x 5 vessels  . LOWER EXTREMITY ANGIOGRAPHY Bilateral 02/04/2019   Procedure: Lower Extremity Angiography;  Surgeon: Lorretta Harp, MD;  Location: [facility];  Service: Cardiovascular;  Laterality: Bilateral;  limited study to iliacs  . LUMBAR DISC SURGERY    . PERIPHERAL VASCULAR BALLOON ANGIOPLASTY Right 10/17/2016   Procedure: Peripheral Vascular Balloon Angioplasty;  Surgeon: Conrad Millard, MD;  Location: [facility];  Service: Cardiovascular;  Laterality: Right;  ARM VEINOUS AND CENTRAL VEIN  . PERIPHERAL VASCULAR INTERVENTION Left 02/04/2019   Procedure: PERIPHERAL  VASCULAR INTERVENTION;  Surgeon: Lorretta Harp, MD;  Location: [facility];  Service: Cardiovascular;  Laterality: Left;  common iliac  . PR POLYSOM 6/>YRS SLEEP 4/> ADDL PARAM ATTND  12/07/2015  . PROSTATE SURGERY    . TOTAL KNEE ARTHROPLASTY  08/01/2011   Procedure: TOTAL KNEE ARTHROPLASTY;  Surgeon: Johnn Hai;  Location: [facility] ORS;  Service: Orthopedics;  Laterality: Right;       Family History  Problem Relation Age of Onset  . Heart attack Father   . Heart disease Father   . Colon polyps Father   . Lung cancer Mother   . Diabetes Sister        x 2  . Heart disease Brother        x 2  . Bone cancer Brother   . Lung disease Neg Hx     Social History   Tobacco Use  . Smoking status: Former  Smoker    Packs/day: 1.00    Years: 10.00    Pack years: 10.00    Types: Cigarettes, Pipe, Cigars    Start date: 12/02/1949    Quit date: 08/05/1958    Years since quitting: 61.0  . Smokeless tobacco: Never Used  Substance Use Topics  . Alcohol use: No  . Drug use: No    Home Medications Prior to Admission medications   Medication Sig Start Date End Date Taking? Authorizing Provider  albuterol (PROVENTIL) (2.5 MG/3ML) 0.083% nebulizer solution Take 3 mLs (2.5 mg total) by nebulization every 6 (six) hours as needed for wheezing or shortness of breath. 12/29/18   Magdalen Spatz, NP  Albuterol Sulfate (PROAIR RESPICLICK) 161 (90 Base) MCG/ACT AEPB Inhale 1-2 puffs into the lungs every 6 (six) hours as needed. Patient taking differently: Inhale 1-2 puffs into the lungs every 6 (six) hours as needed (shortness of breath).  04/08/18   Juanito Doom, MD  allopurinol (ZYLOPRIM) 100 MG tablet TAKE 1 TABLET DAILY Patient taking differently: Take 100 mg by mouth daily.  02/16/19   [name], MD  amiodarone (PACERONE) 200 MG tablet TAKE 1 TABLET DAILY Patient taking differently: Take 200 mg by mouth daily.  05/12/19   [name], MD  aspirin EC 81 MG tablet Take 81 mg by  mouth at bedtime.     [provider]  atorvastatin (LIPITOR) 80 MG tablet Take 1 tablet (80 mg total) by mouth every evening. Please make overdue appt with Dr. Acie Fredrickson before anymore refills. 1st attempt 06/01/19   [name], MD  calcitRIOL (ROCALTROL) 0.25 MCG capsule Take 1 capsule (0.25 mcg total) by mouth every Monday, Wednesday, and Friday with hemodialysis. 12/19/17   Georgette Shell, MD  calcium carbonate (TUMS) 500 MG chewable tablet Chew 1 tablet by mouth 2 (two) times daily as needed for indigestion or heartburn.    [provider]  cetirizine (ZYRTEC) 10 MG tablet Take 10 mg by mouth at bedtime as needed (seasonal allergies).     [provider]  clopidogrel (PLAVIX) 75 MG tablet Take 1 tablet (75 mg total) by mouth daily. 02/05/19   Dunn, Nedra Hai, PA-C  clotrimazole-betamethasone (LOTRISONE) lotion Apply to left foot twice daily 12/15/18   Marzetta Board, DPM  dicyclomine (BENTYL) 10 MG capsule Take 10 mg by mouth 3 (three) times daily as needed for spasms.  05/13/18   [provider]  doxycycline (VIBRA-TABS) 100 MG tablet Take 1 tablet (100 mg total) by mouth 2 (two) times daily. 08/03/19   Trula Slade, DPM  famotidine (PEPCID) 40 MG tablet Take 1 tablet (40 mg total) by mouth at bedtime. 06/01/19   Burns, Claudina Lick, MD  fluticasone furoate-vilanterol (BREO ELLIPTA) 200-25 MCG/INH AEPB Inhale 1 puff into the lungs daily. 12/17/18   Lauraine Rinne, NP  glipiZIDE (GLUCOTROL XL) 5 MG 24 hr tablet Take 5 mg by mouth every evening.  06/23/18   [provider]  multivitamin (RENA-VIT) TABS tablet Take 1 tablet by mouth at bedtime. 12/19/17   Georgette Shell, MD  mupirocin ointment (BACTROBAN) 2 % Apply 1 application topically 2 (two) times daily. 08/03/19   Trula Slade, DPM  Omalizumab Arvid Right ) Inject into the skin every 21 ( twenty-one) days.    [provider]  OXYGEN Inhale 2 L into the lungs See admin  instructions. Inhale 2 L into lungs during dialysis on Monday, Wednesday and Friday and every  night at bedtime    [provider]  pantoprazole (PROTONIX) 40 MG tablet Take 1 tablet (40 mg total) by mouth daily. 07/07/19   Willia Craze, NP  polyethylene glycol [facility] / GLYCOLAX) packet Take 17 g by mouth daily as needed for mild constipation. 12/19/17   Georgette Shell, MD  sodium chloride (OCEAN) 0.65 % SOLN nasal spray Place 1 spray into both nostrils as needed for congestion.    [provider]  Syringe/Needle, Disp, (SYRINGE 3CC/22GX1") 22G X 1" 3 ML MISC Use to give injection 07/09/13   [provider]  tadalafil (CIALIS) 20 MG tablet Take 20 mg by mouth daily as needed for erectile dysfunction. Do not exceed 2 doses in 7 days    [provider]  testosterone cypionate (DEPOTESTOTERONE CYPIONATE) 100 MG/ML injection Inject 400 mg into the muscle every 21 ( twenty-one) days. For IM use only    [provider]  zolpidem (AMBIEN) 10 MG tablet TAKE 1/2 (ONE-HALF) TABLET BY MOUTH AT BEDTIME .  TAKE  AN  ADDITIONAL  1/2  TABLET  IF  NEEDED  FOR  SLEEP Patient taking differently: Take 10-15 mg by mouth at bedtime. Take an additional 1/2 tab if needed to fall asleep. 05/13/19   [name], MD    Allergies    Codeine, Dacarbazine, Hydrocodone, Pseudoephedrine, and Azithromycin  Review of Systems   Review of Systems Level 5 caveat cannot obtain full review of systems due to confusion. Physical Exam Updated Vital Signs BP 120/68 (BP Location: Left Arm)   Pulse (!) 110   Temp 99.8 F (37.7 C) (Temporal)   Resp (!) 26   Wt 93 kg   SpO2 98%   BMI 30.28 kg/m   Physical Exam Constitutional:      Comments: Patient is alert but mildly confused and agitated.  He is answering questions.  Mild to moderate increased work of breathing at rest.  HENT:     Head: Normocephalic and atraumatic.     Mouth/Throat:     Mouth: Mucous membranes are  moist.     Pharynx: Oropharynx is clear.  Eyes:     Extraocular Movements: Extraocular movements intact.  Cardiovascular:     Comments: Tachycardia.  No gross rub murmur gallop. Pulmonary:     Comments: Increased work of breathing.  Intermittent wet cough.  Crackles and wheeze throughout. Abdominal:     General: There is no distension.     Palpations: Abdomen is soft.     Tenderness: There is no abdominal tenderness. There is no guarding.  Musculoskeletal:     Comments: 1-2+ pitting edema bilateral lower extremities.  Calves pliable.  Skin:    General: Skin is warm and dry.  Neurological:     General: No focal deficit present.     Comments: Patient is confused but answering questions appropriately.  All movements are coordinated and purposeful.  No motor deficits.     ED Results / Procedures / Treatments   Labs (all labs ordered are listed, but only abnormal results are displayed) Labs Reviewed  I-STAT CHEM 8, ED - Abnormal; Notable for the following components:      Result Value   Chloride 97 (*)    BUN 43 (*)    Creatinine, Ser 6.00 (*)    Glucose, Bld 135 (*)    Calcium, Ion 1.00 (*)    Hemoglobin 12.2 (*)    HCT 36.0 (*)    All other components within normal  limits  CULTURE, BLOOD (ROUTINE X 2)  CULTURE, BLOOD (ROUTINE X 2)  CBC WITH DIFFERENTIAL/PLATELET  BASIC METABOLIC PANEL  LACTIC ACID, PLASMA  LACTIC ACID, PLASMA  FERRITIN  TRIGLYCERIDES  C-REACTIVE PROTEIN  D-DIMER, QUANTITATIVE (NOT AT [facility])  FIBRINOGEN  HEPATIC FUNCTION PANEL  LACTATE DEHYDROGENASE  PROCALCITONIN  POC SARS CORONAVIRUS 2 AG -  ED  TROPONIN I (HIGH SENSITIVITY)    EKG None  Radiology No results found.  Procedures Procedures (including critical care time) CRITICAL CARE Performed by: Charlesetta Shanks   Total critical care time: 30  minutes  Critical care time was exclusive of separately billable procedures and treating other patients.  Critical care was necessary to treat  or prevent imminent or life-threatening deterioration.  Critical care was time spent personally by me on the following activities: development of treatment plan with patient and/or surrogate as well as nursing, discussions with consultants, evaluation of patient's response to treatment, examination of patient, obtaining history from patient or surrogate, ordering and performing treatments and interventions, ordering and review of laboratory studies, ordering and review of radiographic studies, pulse oximetry and re-evaluation of patient's condition. Medications Ordered in ED Medications  albuterol (VENTOLIN HFA) 108 (90 Base) MCG/ACT inhaler 4 puff (4 puffs Inhalation Given 08/18/2019 2118)  ipratropium (ATROVENT HFA) inhaler 2 puff (2 puffs Inhalation Given 08/18/2019 2118)  methylPREDNISolone sodium succinate (SOLU-MEDROL) 125 mg/2 mL injection 125 mg (125 mg Intravenous Given 09/04/2019 2107)    ED Course  I have reviewed the triage vital signs and the nursing notes.  Pertinent labs & imaging results that were available during my care of the patient were reviewed by me and considered in my medical decision making (see chart for details).  Clinical Course as of Aug 07 2348  Nancy Fetter Aug 08, 2019  2151 Consult: Reviewed with Dr. Melvia Heaps nephrology.  He will consult and see the patient in the emergency department.  No availability at this time for emergent dialysis.   [MP]  2152 I have updated the patient's wife on the patient's condition and possibility of intubation should his respiratory status deteriorate.  She agrees that intubation would be consistent with their wishes.   [MP]  2349 Consult: Dr. Jonelle Sidle excepts for admission.   [MP]    Clinical Course User Index [MP] Charlesetta Shanks, MD   MDM Rules/Calculators/A&P                     Patient presents with hypoxic respiratory failure.  On oxygen he does maintain saturation in the high 90s.  Off oxygen he has desaturation into the 80s.  Patient's chest  x-ray shows significant congestion.  No fever has been documented at home.  Differential diagnosis includes CHF and volume overload, COVID-19 or other infectious process.  Patient will be admitted.  Plan will be for a.m. dialysis based on consultation with Dr. Burnett Sheng.  Shane [name] was evaluated in Emergency Department on 08/15/2019 for the symptoms described in the history of present illness. He was evaluated in the context of the global COVID-19 pandemic, which necessitated consideration that the patient might be at risk for infection with the SARS-CoV-2 virus that causes COVID-19. Institutional protocols and algorithms that pertain to the evaluation of patients at risk for COVID-19 are in a state of rapid change based on information released by regulatory bodies including the CDC and federal and state organizations. These policies and algorithms were followed during the patient's care in the ED. Final Clinical Impression(s) / ED Diagnoses  Final diagnoses:  None    Rx / DC Orders ED Discharge Orders    None       Charlesetta Shanks, MD 09/05/2019 2354

## 2019-08-08 NOTE — ED Notes (Signed)
Pt switched from [facility] to [place] due to pt continously pulling off mask, pt reports "I cant breathe", MD aware.

## 2019-08-08 NOTE — H&P (Signed)
History and Physical   Shane [name]:[id number] DOB: Nov 12, 1933 DOA: 08/18/2019  Referring MD/NP/PA: Dr. Johnney Killian  PCP: [name], MD   Outpatient Specialists: [facility]  Patient coming from: Home  Chief Complaint: Shortness of breath and cough for a few days  HPI: Shane [name] is a 86 y.o. male with medical history significant of end-stage renal disease on hemodialysis Mondays Wednesdays and Fridays, history of AICD placement, systolic dysfunction CHF with last EF of 40 to 45% in 2019, hypertension, diabetes, hyperlipidemia and coronary artery disease who was brought in to the ER with progressive shortness of breath cough over the last few days.  Patient has been consistent with his hemodialysis and has not missed any session.  He is however hypoxic.  Also having significant lower extremity edema and chest x-ray showing significant pulmonary edema.  Denied any chest pain.  Denied any fever chills denied any exposure to COVID-19.  Initial Covid antigen test in the ER is negative.  Patient evaluated by nephrology and scheduled for hemodialysis in the morning.  He is being admitted with acute exacerbation of CHF.  His last EF evaluation was in 2019.Marland Kitchen  ED Course: Temperature is 99.8 blood pressure 120/69 pulse 110 respiratory 26 oxygen sat 93% on room air.  White count 4.2 hemoglobin 12.2 and platelets of 61 sodium 141 potassium 4.2 chloride 97 CO2 24 BUN 43 creatinine 6.0.  Chest x-ray showed bilateral pulmonary edema.  Patient is being admitted for possible hemodialysis and work-up of systolic dysfunction CHF  Review of Systems: As per HPI otherwise 10 point review of systems negative.    Past Medical History:  Diagnosis Date  . Abnormal nuclear cardiac imaging test Nov 2010   moderate area of infarct in the inferior wall with only minimal reversibility and EF of 28%  . AICD (automatic cardioverter/defibrillator) present   . Allergic rhinitis 01-08-13   Uses  nebulizer for chronic sinus issues and Mucinex.  . Arthritis    osteoarthritis   . Asthma    Extrinic  . Blood transfusion    ? at time of bypass surgery   . BPH (benign prostatic hyperplasia)   . Cellulitis of arm, left    MSSA  . CHF (congestive heart failure) ([place])   . Colon polyps    adenomatous  . Coronary artery disease    remote CABG in 1985, cath in 2003 by Dr. Lia Foyer with no PCI, last nuclear in 2010 showing scar and EF of 28%.   . Diabetes mellitus   . Diabetic neuropathy ([place]) 04/25/2017  . Dyslipidemia   . ESRD (end stage renal disease) ([place])   . Gait abnormality 04/25/2017  . Glaucoma 01-08-13   tx. eye drops  . Hemodialysis patient ([place])   . HOH (hard of hearing)   . HTN (hypertension)   . Hypercholesterolemia   . Left ventricular dysfunction    28% per nuclear in 2010 and 35 to 40% per echo in 2010  . Myocardial infarction ([place])    1985  . Neuromuscular disorder ([place])    legs mild paralysis-able to walk"nerve damage"-legs- left leg brace   . Neuropathy   . Pneumonia    hx of several times years ago   . PVD (peripheral vascular disease) ([place])    a. s/p PTA/stenting of L CIA 02/2019.  Marland Kitchen Renal artery stenosis (HCC)    a. moderate bilateral by angio 02/2019.  Marland Kitchen Spinal stenosis     Past Surgical History:  Procedure  Laterality Date  . A/V FISTULAGRAM Right 10/17/2016   Procedure: A/V Fistulagram;  Surgeon: Conrad Des Allemands, MD;  Location: [place] [facility];  Service: Cardiovascular;  Laterality: Right;  . ABDOMINAL AORTOGRAM N/A 02/04/2019   Procedure: ABDOMINAL AORTOGRAM;  Surgeon: Lorretta Harp, MD;  Location: [place] [facility];  Service: Cardiovascular;  Laterality: N/A;  . BACK SURGERY     hx of back surgery x 4   . BASCILIC VEIN TRANSPOSITION Right 09/09/2016   Procedure: BASCILIC VEIN TRANSPOSITION Right Arm;  Surgeon: Rosetta Posner, MD;  Location: [facility] OR;  Service: Vascular;  Laterality: Right;  . BI-VENTRICULAR IMPLANTABLE CARDIOVERTER DEFIBRILLATOR N/A  10/10/2014   Procedure: BI-VENTRICULAR IMPLANTABLE CARDIOVERTER DEFIBRILLATOR  (CRT-D);  Surgeon: [name], MD;  Location: [facility] CATH LAB;  Service: Cardiovascular;  Laterality: N/A;  . CARDIAC CATHETERIZATION  2003  . Cataract      recent cataract surgery 6'14  . COLONOSCOPY N/A 01/25/2013   Procedure: COLONOSCOPY;  Surgeon: Irene Shipper, MD;  Location: [facility];  Service: Endoscopy;  Laterality: N/A;  . CORONARY ARTERY BYPASS GRAFT  1985   x 5 vessels  . LOWER EXTREMITY ANGIOGRAPHY Bilateral 02/04/2019   Procedure: Lower Extremity Angiography;  Surgeon: Lorretta Harp, MD;  Location: [place] [facility];  Service: Cardiovascular;  Laterality: Bilateral;  limited study to iliacs  . LUMBAR DISC SURGERY    . PERIPHERAL VASCULAR BALLOON ANGIOPLASTY Right 10/17/2016   Procedure: Peripheral Vascular Balloon Angioplasty;  Surgeon: Conrad Panama, MD;  Location: [place] [facility];  Service: Cardiovascular;  Laterality: Right;  ARM VEINOUS AND CENTRAL VEIN  . PERIPHERAL VASCULAR INTERVENTION Left 02/04/2019   Procedure: PERIPHERAL VASCULAR INTERVENTION;  Surgeon: Lorretta Harp, MD;  Location: [place] [facility];  Service: Cardiovascular;  Laterality: Left;  common iliac  . PR POLYSOM 6/>YRS SLEEP 4/> ADDL PARAM ATTND  12/07/2015  . PROSTATE SURGERY    . TOTAL KNEE ARTHROPLASTY  08/01/2011   Procedure: TOTAL KNEE ARTHROPLASTY;  Surgeon: Johnn Hai;  Location: [facility] ORS;  Service: Orthopedics;  Laterality: Right;     reports that he quit smoking about 61 years ago. His smoking use included cigarettes, pipe, and cigars. He started smoking about 69 years ago. He has a 10.00 pack-year smoking history. He has never used smokeless tobacco. He reports that he does not drink alcohol or use drugs.  Allergies  Allergen Reactions  . Codeine Other (See Comments)    anxiety  . Dacarbazine Other (See Comments)  . Hydrocodone Other (See Comments)    Anxiety- can take in liquid form  . Pseudoephedrine  Other (See Comments)    Causes heart to race  . Azithromycin Rash    Family History  Problem Relation Age of Onset  . Heart attack Father   . Heart disease Father   . Colon polyps Father   . Lung cancer Mother   . Diabetes Sister        x 2  . Heart disease Brother        x 2  . Bone cancer Brother   . Lung disease Neg Hx      Prior to Admission medications   Medication Sig Start Date End Date Taking? Authorizing Provider  albuterol (PROVENTIL) (2.5 MG/3ML) 0.083% nebulizer solution Take 3 mLs (2.5 mg total) by nebulization every 6 (six) hours as needed for wheezing or shortness of breath. 12/29/18   Magdalen Spatz, NP  Albuterol Sulfate (PROAIR RESPICLICK) 010 (  90 Base) MCG/ACT AEPB Inhale 1-2 puffs into the lungs every 6 (six) hours as needed. Patient taking differently: Inhale 1-2 puffs into the lungs every 6 (six) hours as needed (shortness of breath).  04/08/18   Juanito Doom, MD  allopurinol (ZYLOPRIM) 100 MG tablet TAKE 1 TABLET DAILY Patient taking differently: Take 100 mg by mouth daily.  02/16/19   [name], MD  amiodarone (PACERONE) 200 MG tablet TAKE 1 TABLET DAILY Patient taking differently: Take 200 mg by mouth daily.  05/12/19   [name], MD  aspirin EC 81 MG tablet Take 81 mg by mouth at bedtime.     [provider]  atorvastatin (LIPITOR) 80 MG tablet Take 1 tablet (80 mg total) by mouth every evening. Please make overdue appt with Dr. Acie Fredrickson before anymore refills. 1st attempt 06/01/19   [name], MD  calcitRIOL (ROCALTROL) 0.25 MCG capsule Take 1 capsule (0.25 mcg total) by mouth every Monday, Wednesday, and Friday with hemodialysis. 12/19/17   Georgette Shell, MD  calcium carbonate (TUMS) 500 MG chewable tablet Chew 1 tablet by mouth 2 (two) times daily as needed for indigestion or heartburn.    [provider]  cetirizine (ZYRTEC) 10 MG tablet Take 10 mg by mouth at bedtime as needed (seasonal allergies).     [provider]  clopidogrel (PLAVIX) 75 MG tablet Take 1 tablet (75 mg total) by mouth daily. 02/05/19   Dunn, Nedra Hai, PA-C  clotrimazole-betamethasone (LOTRISONE) lotion Apply to left foot twice daily 12/15/18   Marzetta Board, DPM  dicyclomine (BENTYL) 10 MG capsule Take 10 mg by mouth 3 (three) times daily as needed for spasms.  05/13/18   [provider]  doxycycline (VIBRA-TABS) 100 MG tablet Take 1 tablet (100 mg total) by mouth 2 (two) times daily. 08/03/19   Trula Slade, DPM  famotidine (PEPCID) 40 MG tablet Take 1 tablet (40 mg total) by mouth at bedtime. 06/01/19   Burns, Claudina Lick, MD  fluticasone furoate-vilanterol (BREO ELLIPTA) 200-25 MCG/INH AEPB Inhale 1 puff into the lungs daily. 12/17/18   Lauraine Rinne, NP  glipiZIDE (GLUCOTROL XL) 5 MG 24 hr tablet Take 5 mg by mouth every evening.  06/23/18   [provider]  multivitamin (RENA-VIT) TABS tablet Take 1 tablet by mouth at bedtime. 12/19/17   Georgette Shell, MD  mupirocin ointment (BACTROBAN) 2 % Apply 1 application topically 2 (two) times daily. 08/03/19   Trula Slade, DPM  Omalizumab Arvid Right [place]) Inject into the skin every 21 ( twenty-one) days.    [provider]  OXYGEN Inhale 2 L into the lungs See admin instructions. Inhale 2 L into lungs during dialysis on Monday, Wednesday and Friday and every night at bedtime    [provider]  pantoprazole (PROTONIX) 40 MG tablet Take 1 tablet (40 mg total) by mouth daily. 07/07/19   Willia Craze, NP  polyethylene glycol [facility] / GLYCOLAX) packet Take 17 g by mouth daily as needed for mild constipation. 12/19/17   Georgette Shell, MD  sodium chloride (OCEAN) 0.65 % SOLN nasal spray Place 1 spray into both nostrils as needed for congestion.    [provider]  Syringe/Needle, Disp, (SYRINGE 3CC/22GX1") 22G X 1" 3 ML MISC Use to give injection 07/09/13   [provider]  tadalafil (CIALIS) 20 MG tablet Take 20  mg by mouth daily as needed for erectile dysfunction. Do not exceed 2 doses  in 7 days    [provider]  testosterone cypionate (DEPOTESTOTERONE CYPIONATE) 100 MG/ML injection Inject 400 mg into the muscle every 21 ( twenty-one) days. For IM use only    [provider]  zolpidem (AMBIEN) 10 MG tablet TAKE 1/2 (ONE-HALF) TABLET BY MOUTH AT BEDTIME .  TAKE  AN  ADDITIONAL  1/2  TABLET  IF  NEEDED  FOR  SLEEP Patient taking differently: Take 10-15 mg by mouth at bedtime. Take an additional 1/2 tab if needed to fall asleep. 05/13/19   [name], MD    Physical Exam: Vitals:   09/05/2019 2117 08/09/2019 2118 08/12/2019 2135 08/10/2019 2145  BP:  120/68  120/69  Pulse:  (!) 110  (!) 108  Resp:  (!) 26  (!) 24  Temp:  99.8 F (37.7 C)    TempSrc:  Temporal    SpO2: 100% 98% 98% 98%  Weight:  93 kg        Constitutional: Acutely ill looking in obvious distress Vitals:   09/04/2019 2117 08/15/2019 2118 08/20/2019 2135 08/20/2019 2145  BP:  120/68  120/69  Pulse:  (!) 110  (!) 108  Resp:  (!) 26  (!) 24  Temp:  99.8 F (37.7 C)    TempSrc:  Temporal    SpO2: 100% 98% 98% 98%  Weight:  93 kg     Eyes: PERRL, lids and conjunctivae normal ENMT: Mucous membranes are moist. Posterior pharynx clear of any exudate or lesions.Normal dentition.  Neck: normal, supple, no masses, no thyromegaly Respiratory: Decreased air entry bilaterally with basal crackles and some rhonchi, no wheezing.. No accessory muscle use.  Cardiovascular: Sinus tachycardia no murmurs / rubs / gallops.  2+ extremity edema. 2+ pedal pulses. No carotid bruits.  Abdomen: no tenderness, no masses palpated. No hepatosplenomegaly. Bowel sounds positive.  Musculoskeletal: no clubbing / cyanosis. No joint deformity upper and lower extremities. Good ROM, no contractures. Normal muscle tone.  Skin: no rashes, lesions, ulcers. No induration Neurologic: CN 2-12 grossly intact. Sensation intact, DTR normal. Strength 5/5 in all  4.  Psychiatric: Normal judgment and insight. Alert and oriented x 3. Normal mood.     Labs on Admission: I have personally reviewed following labs and imaging studies  CBC: Recent Labs  Lab 09/04/2019 2045 08/10/2019 2101  WBC 4.2  --   NEUTROABS 2.6  --   HGB 10.0* 12.2*  HCT 34.1* 36.0*  MCV 105.2*  --   PLT 61*  --    Basic Metabolic Panel: Recent Labs  Lab 08/30/2019 2045 08/12/2019 2101  NA 135 141  K 4.2 4.2  CL 93* 97*  CO2 24  --   GLUCOSE 139* 135*  BUN 41* 43*  CREATININE 5.93* 6.00*  CALCIUM 9.0  --    GFR: Estimated Creatinine Clearance: 10.3 mL/min (A) (by C-G formula based on SCr of 6 mg/dL (H)). Liver Function Tests: Recent Labs  Lab 09/02/2019 2045  AST 54*  ALT 30  ALKPHOS 110  BILITOT 2.0*  PROT 7.2  ALBUMIN 3.1*   No results for input(s): LIPASE, AMYLASE in the last 168 hours. No results for input(s): AMMONIA in the last 168 hours. Coagulation Profile: No results for input(s): INR, PROTIME in the last 168 hours. Cardiac Enzymes: No results for input(s): CKTOTAL, CKMB, CKMBINDEX, TROPONINI in the last 168 hours. BNP (last 3 results) Recent Labs    12/29/18 1428  PROBNP 2,417.0*   HbA1C: No results for input(s): HGBA1C in the  last 72 hours. CBG: No results for input(s): GLUCAP in the last 168 hours. Lipid Profile: Recent Labs    08/31/2019 2045  TRIG 131   Thyroid Function Tests: No results for input(s): TSH, T4TOTAL, FREET4, T3FREE, THYROIDAB in the last 72 hours. Anemia Panel: No results for input(s): VITAMINB12, FOLATE, FERRITIN, TIBC, IRON, RETICCTPCT in the last 72 hours. Urine analysis:    Component Value Date/Time   COLORURINE AMBER (A) 12/16/2017 0346   APPEARANCEUR HAZY (A) 12/16/2017 0346   LABSPEC 1.025 12/16/2017 0346   PHURINE 6.0 12/16/2017 0346   GLUCOSEU NEGATIVE 12/16/2017 0346   GLUCOSEU NEGATIVE 06/29/2012 1218   HGBUR MODERATE (A) 12/16/2017 0346   BILIRUBINUR SMALL (A) 12/16/2017 0346   KETONESUR NEGATIVE  12/16/2017 0346   PROTEINUR >300 (A) 12/16/2017 0346   UROBILINOGEN 0.2 10/05/2014 2249   NITRITE NEGATIVE 12/16/2017 0346   LEUKOCYTESUR NEGATIVE 12/16/2017 0346   Sepsis Labs: @LABRCNTIP (procalcitonin:4,lacticidven:4) )No results found for this or any previous visit (from the past 240 hour(s)).   Radiological Exams on Admission: DG Chest Portable 1 View  Result Date: 08/10/2019 CLINICAL DATA:  Shortness of breath. EXAM: PORTABLE CHEST 1 VIEW COMPARISON:  02/07/2019 FINDINGS: Previous median sternotomy, CABG and pacemaker/AICD placement. Poor inspiration. Interstitial edema. Basilar volume loss. Findings most consistent with heart failure. IMPRESSION: Probable congestive heart failure. Cardiomegaly. Venous hypertension and interstitial edema. Poor inspiration with basilar volume loss. Electronically Signed   By: Nelson Chimes M.D.   On: 08/19/2019 21:31    EKG: Independently reviewed.  Shows sinus tachycardia with a rate of 110.  Paced rhythm with evidence of previous ischemia  Assessment/Plan Principal Problem:   Acute exacerbation of CHF (congestive heart failure) (HCC) Active Problems:   Hypercholesterolemia   DM (diabetes mellitus), type 2 with renal complications (HCC)   Benign hypertensive heart disease without heart failure   Severe obstructive sleep apnea   Chronic combined systolic and diastolic CHF (congestive heart failure) ([place])   ICD (implantable cardioverter-defibrillator) discharge   ESRD (end stage renal disease) ([place])   GERD (gastroesophageal reflux disease)     #1 acute on chronic respiratory failure: Suspected acute exacerbation of CHF.  Patient will be admitted.  Get hemodialyzed.  Need to rule out COVID-19 again with the PCR test.  Get echocardiogram since the last evaluation is almost over a year ago.  Continue other cardiac medications.  #2 diabetes: Sliding scale insulin.  #3 hyperlipidemia: Continue home regimen.  #4 end-stage renal disease: Hemodialysis  Mondays Wednesdays and Fridays.  Scheduled for hemodialysis tomorrow.  Continue per nephrology  #5 GERD: Continue PPIs.  #6 essential hypertension: Continue with blood pressure control.  #7 obstructive sleep apnea: Patient may need CPAP at night.   DVT prophylaxis: Heparin Code Status: Full code Family Communication: Wife Disposition Plan: To be determined Consults called: Dr. Roney Jaffe, nephrology Admission status: Inpatient  Severity of Illness: The appropriate patient status for this patient is INPATIENT. Inpatient status is judged to be reasonable and necessary in order to provide the required intensity of service to ensure the patient's safety. The patient's presenting symptoms, physical exam findings, and initial radiographic and laboratory data in the context of their chronic comorbidities is felt to place them at high risk for further clinical deterioration. Furthermore, it is not anticipated that the patient will be medically stable for discharge from the hospital within 2 midnights of admission. The following factors support the patient status of inpatient.   " The patient's presenting symptoms include shortness of breath. "  The worrisome physical exam findings include bilateral crackles. " The initial radiographic and laboratory data are worrisome because of pulmonary edema rather on x-ray. " The chronic co-morbidities include end-stage renal disease.   * I certify that at the point of admission it is my clinical judgment that the patient will require inpatient hospital care spanning beyond 2 midnights from the point of admission due to high intensity of service, high risk for further deterioration and high frequency of surveillance required.Barbette Merino MD [facility] Pager [phone]  If 7PM-7AM, please contact night-coverage [email or web address] Password [facility]  09/05/2019, 11:51 PM

## 2019-08-08 NOTE — Consult Note (Signed)
Renal Service Consult Note [facility]  Shane [name] 08/14/2019 [place]azing Requesting Physician:  Dr Johnney Killian  Reason for Consult:  ESRD pt w/ SOB HPI: The patient is a 86 y.o. year-old with hx of RAS, CAD sp CABG '85, HTN, HOH, ESRD on HD, DM2 w neuropathy, BPH, AICD '16 presented to ED tonight w/ SOB and frequent coughing. On HD MWF, last HD was Sat (yesterday) on the holiday schedule. Hasn't missed HD.  Denies any fevers, chills, sweats, headache or malaise. Says his breathing has been bothering him for "6 months".  No chest pain or prod cough. Asked to see for ESRD.   Pt on HD for about 1.5 yrs now, using R arm AVF, no recent issues for HD or access.   Only admit this year was July 2020 for aortogram because of claudication of LE's and he ended of with L sided PTA/ covered stenting to the L common iliac.  In 2019 in Jan was admitted for ICD shock and low K+ In 2019 in May was admitted for gout flare up of the knee   ROS  denies CP  no joint pain   no HA  no blurry vision  no rash  no diarrhea  no nausea/ vomiting   Past Medical History  Past Medical History:  Diagnosis Date  . Abnormal nuclear cardiac imaging test Nov 2010   moderate area of infarct in the inferior wall with only minimal reversibility and EF of 28%  . AICD (automatic cardioverter/defibrillator) present   . Allergic rhinitis 01-08-13   Uses nebulizer for chronic sinus issues and Mucinex.  . Arthritis    osteoarthritis   . Asthma    Extrinic  . Blood transfusion    ? at time of bypass surgery   . BPH (benign prostatic hyperplasia)   . Cellulitis of arm, left    MSSA  . CHF (congestive heart failure) ([place])   . Colon polyps    adenomatous  . Coronary artery disease    remote CABG in 1985, cath in 2003 by Dr. Lia Foyer with no PCI, last nuclear in 2010 showing scar and EF of 28%.   . Diabetes mellitus   . Diabetic neuropathy ([place]) 04/25/2017  . Dyslipidemia   . ESRD (end stage  renal disease) ([place])   . Gait abnormality 04/25/2017  . Glaucoma 01-08-13   tx. eye drops  . Hemodialysis patient ([place])   . HOH (hard of hearing)   . HTN (hypertension)   . Hypercholesterolemia   . Left ventricular dysfunction    28% per nuclear in 2010 and 35 to 40% per echo in 2010  . Myocardial infarction ([place])    1985  . Neuromuscular disorder ([place])    legs mild paralysis-able to walk"nerve damage"-legs- left leg brace   . Neuropathy   . Pneumonia    hx of several times years ago   . PVD (peripheral vascular disease) ([place])    a. s/p PTA/stenting of L CIA 02/2019.  Marland Kitchen Renal artery stenosis ([facility])    a. moderate bilateral by angio 02/2019.  Marland Kitchen Spinal stenosis    Past Surgical History  Past Surgical History:  Procedure Laterality Date  . A/V FISTULAGRAM Right 10/17/2016   Procedure: A/V Fistulagram;  Surgeon: Conrad El Cenizo, MD;  Location: [place] [facility];  Service: Cardiovascular;  Laterality: Right;  . ABDOMINAL AORTOGRAM N/A 02/04/2019   Procedure: ABDOMINAL AORTOGRAM;  Surgeon: Lorretta Harp, MD;  Location: [place] [facility];  Service: Cardiovascular;  Laterality: N/A;  . BACK SURGERY     hx of back surgery x 4   . BASCILIC VEIN TRANSPOSITION Right 09/09/2016   Procedure: BASCILIC VEIN TRANSPOSITION Right Arm;  Surgeon: Rosetta Posner, MD;  Location: [facility] OR;  Service: Vascular;  Laterality: Right;  . BI-VENTRICULAR IMPLANTABLE CARDIOVERTER DEFIBRILLATOR N/A 10/10/2014   Procedure: BI-VENTRICULAR IMPLANTABLE CARDIOVERTER DEFIBRILLATOR  (CRT-D);  Surgeon: [name], MD;  Location: [facility] CATH LAB;  Service: Cardiovascular;  Laterality: N/A;  . CARDIAC CATHETERIZATION  2003  . Cataract      recent cataract surgery 6'14  . COLONOSCOPY N/A 01/25/2013   Procedure: COLONOSCOPY;  Surgeon: Irene Shipper, MD;  Location: [facility];  Service: Endoscopy;  Laterality: N/A;  . CORONARY ARTERY BYPASS GRAFT  1985   x 5 vessels  . LOWER EXTREMITY ANGIOGRAPHY Bilateral 02/04/2019   Procedure: Lower  Extremity Angiography;  Surgeon: Lorretta Harp, MD;  Location: [place] [facility];  Service: Cardiovascular;  Laterality: Bilateral;  limited study to iliacs  . LUMBAR DISC SURGERY    . PERIPHERAL VASCULAR BALLOON ANGIOPLASTY Right 10/17/2016   Procedure: Peripheral Vascular Balloon Angioplasty;  Surgeon: Conrad Kirtland, MD;  Location: [place] [facility];  Service: Cardiovascular;  Laterality: Right;  ARM VEINOUS AND CENTRAL VEIN  . PERIPHERAL VASCULAR INTERVENTION Left 02/04/2019   Procedure: PERIPHERAL VASCULAR INTERVENTION;  Surgeon: Lorretta Harp, MD;  Location: [place] [facility];  Service: Cardiovascular;  Laterality: Left;  common iliac  . PR POLYSOM 6/>YRS SLEEP 4/> ADDL PARAM ATTND  12/07/2015  . PROSTATE SURGERY    . TOTAL KNEE ARTHROPLASTY  08/01/2011   Procedure: TOTAL KNEE ARTHROPLASTY;  Surgeon: Johnn Hai;  Location: [facility] ORS;  Service: Orthopedics;  Laterality: Right;   Family History  Family History  Problem Relation Age of Onset  . Heart attack Father   . Heart disease Father   . Colon polyps Father   . Lung cancer Mother   . Diabetes Sister        x 2  . Heart disease Brother        x 2  . Bone cancer Brother   . Lung disease Neg Hx    Social History  reports that he quit smoking about 61 years ago. His smoking use included cigarettes, pipe, and cigars. He started smoking about 69 years ago. He has a 10.00 pack-year smoking history. He has never used smokeless tobacco. He reports that he does not drink alcohol or use drugs. Allergies  Allergies  Allergen Reactions  . Codeine Other (See Comments)    anxiety  . Dacarbazine Other (See Comments)  . Hydrocodone Other (See Comments)    Anxiety- can take in liquid form  . Pseudoephedrine Other (See Comments)    Causes heart to race  . Azithromycin Rash   Home medications Prior to Admission medications   Medication Sig Start Date End Date Taking? Authorizing Provider  albuterol (PROVENTIL) (2.5 MG/3ML) 0.083%  nebulizer solution Take 3 mLs (2.5 mg total) by nebulization every 6 (six) hours as needed for wheezing or shortness of breath. 12/29/18   Magdalen Spatz, NP  Albuterol Sulfate (PROAIR RESPICLICK) 970 (90 Base) MCG/ACT AEPB Inhale 1-2 puffs into the lungs every 6 (six) hours as needed. Patient taking differently: Inhale 1-2 puffs into the lungs every 6 (six) hours as needed (shortness of breath).  04/08/18   Juanito Doom, MD  allopurinol (ZYLOPRIM) 100 MG tablet TAKE 1 TABLET  DAILY Patient taking differently: Take 100 mg by mouth daily.  02/16/19   [name], MD  amiodarone (PACERONE) 200 MG tablet TAKE 1 TABLET DAILY Patient taking differently: Take 200 mg by mouth daily.  05/12/19   [name], MD  aspirin EC 81 MG tablet Take 81 mg by mouth at bedtime.     [provider]  atorvastatin (LIPITOR) 80 MG tablet Take 1 tablet (80 mg total) by mouth every evening. Please make overdue appt with Dr. Acie Fredrickson before anymore refills. 1st attempt 06/01/19   [name], MD  calcitRIOL (ROCALTROL) 0.25 MCG capsule Take 1 capsule (0.25 mcg total) by mouth every Monday, Wednesday, and Friday with hemodialysis. 12/19/17   Georgette Shell, MD  calcium carbonate (TUMS) 500 MG chewable tablet Chew 1 tablet by mouth 2 (two) times daily as needed for indigestion or heartburn.    [provider]  cetirizine (ZYRTEC) 10 MG tablet Take 10 mg by mouth at bedtime as needed (seasonal allergies).     [provider]  clopidogrel (PLAVIX) 75 MG tablet Take 1 tablet (75 mg total) by mouth daily. 02/05/19   Dunn, Nedra Hai, PA-C  clotrimazole-betamethasone (LOTRISONE) lotion Apply to left foot twice daily 12/15/18   Marzetta Board, DPM  dicyclomine (BENTYL) 10 MG capsule Take 10 mg by mouth 3 (three) times daily as needed for spasms.  05/13/18   [provider]  doxycycline (VIBRA-TABS) 100 MG tablet Take 1 tablet (100 mg total) by mouth 2 (two) times daily. 08/03/19    Trula Slade, DPM  famotidine (PEPCID) 40 MG tablet Take 1 tablet (40 mg total) by mouth at bedtime. 06/01/19   Burns, Claudina Lick, MD  fluticasone furoate-vilanterol (BREO ELLIPTA) 200-25 MCG/INH AEPB Inhale 1 puff into the lungs daily. 12/17/18   Lauraine Rinne, NP  glipiZIDE (GLUCOTROL XL) 5 MG 24 hr tablet Take 5 mg by mouth every evening.  06/23/18   [provider]  multivitamin (RENA-VIT) TABS tablet Take 1 tablet by mouth at bedtime. 12/19/17   Georgette Shell, MD  mupirocin ointment (BACTROBAN) 2 % Apply 1 application topically 2 (two) times daily. 08/03/19   Trula Slade, DPM  Omalizumab Arvid Right [place]) Inject into the skin every 21 ( twenty-one) days.    [provider]  OXYGEN Inhale 2 L into the lungs See admin instructions. Inhale 2 L into lungs during dialysis on Monday, Wednesday and Friday and every night at bedtime    [provider]  pantoprazole (PROTONIX) 40 MG tablet Take 1 tablet (40 mg total) by mouth daily. 07/07/19   Willia Craze, NP  polyethylene glycol [facility] / GLYCOLAX) packet Take 17 g by mouth daily as needed for mild constipation. 12/19/17   Georgette Shell, MD  sodium chloride (OCEAN) 0.65 % SOLN nasal spray Place 1 spray into both nostrils as needed for congestion.    [provider]  Syringe/Needle, Disp, (SYRINGE 3CC/22GX1") 22G X 1" 3 ML MISC Use to give injection 07/09/13   [provider]  tadalafil (CIALIS) 20 MG tablet Take 20 mg by mouth daily as needed for erectile dysfunction. Do not exceed 2 doses in 7 days    [provider]  testosterone cypionate (DEPOTESTOTERONE CYPIONATE) 100 MG/ML injection Inject 400 mg into the muscle every 21 ( twenty-one) days. For IM use only    [provider]  zolpidem (AMBIEN) 10 MG tablet TAKE 1/2 (ONE-HALF) TABLET BY MOUTH AT BEDTIME .  TAKE  AN  ADDITIONAL  1/2  TABLET  IF  NEEDED  FOR  SLEEP Patient taking differently: Take 10-15 mg by mouth at  bedtime. Take an additional 1/2 tab if needed to fall asleep. 05/13/19   [name], MD   Liver Function Tests No results for input(s): AST, ALT, ALKPHOS, BILITOT, PROT, ALBUMIN in the last 168 hours. No results for input(s): LIPASE, AMYLASE in the last 168 hours. CBC Recent Labs  Lab 08/19/2019 2101  HGB 12.2*  HCT 62.2*   Basic Metabolic Panel Recent Labs  Lab 08/24/2019 2101  NA 141  K 4.2  CL 97*  GLUCOSE 135*  BUN 43*  CREATININE 6.00*   Iron/TIBC/Ferritin/ %Sat No results found for: IRON, TIBC, FERRITIN, IRONPCTSAT  Vitals:   08/15/2019 2112 08/30/2019 2117 08/25/2019 2118 08/24/2019 2135  BP:   120/68   Pulse:   (!) 110   Resp:   (!) 26   Temp:   99.8 F (37.7 C)   TempSrc:   Temporal   SpO2: 100% 100% 98% 98%  Weight:   93 kg     Exam Gen coughing, not in distress, WOB is up some, appears calm though No rash, cyanosis or gangrene Sclera anicteric, throat clear  No jvd or bruits Chest soft scattered rales, occ wheezes bialt RRR no MRG Abd soft ntnd no mass or ascites +bs GU normal male MS no joint effusions or deformity Ext 2+ pitting pretib edema, no wounds or ulcers Neuro is alert, Ox 3 , nf    Home meds:  - amiodarone 200  - clopidogrel 75/ aspirin 81/ atorvastatin 80 hs  - glipizide 5 hs  - fluticasone- vilanterol 200-25 qd/ home O2 2L Home  - allopurinol 100 qd/ pantoprazole 40 qd  - omalizumab q 21d  - prn's/ vitamins/ supplements    CXR 1/3 > Previous median sternotomy, CABG and pacemaker/AICD placement. Poor inspiration. Interstitial edema. Basilar volume loss. Findings most consistent with heart failure.   Outpt HD: MWF [facility]  4h   400/800   91.5kg   2/2 bath  AVF R  Hep none  - venofer 50 / wk  - mircera 150 ug q2 wks, last ?  - calcitriol 1.0 ug tiw     Assessment/ Plan: 1. Acute/ chronic resp failure - on home O2, hx CHF. CXR showing CHF. Exam showing vol excess w/ sig LE edema. Rapid covid neg , PCR is pending. Does have a fever 99.8.   Don't have HD slot at this time due to other emergencies, will plan on HD 1st shift Monday am.  2. Low grade fever - 2nd test for COVID is pending, Ag was negative 3. ESRD - on HD MWF. HD Monday am.  4. CAD sp remote CABG 5. PAD - sp perc revasc to LLE in July 6. CM/ syst CHF / sp AICD 7. Anemia ckd - Hb 10- 12, observe      Kelly Splinter  MD 08/24/2019, 9:44 PM

## 2019-08-08 NOTE — ED Triage Notes (Signed)
Pt transported from home by EMS, worsening shob, continuous cough, HD MWF, last tx yesterday.  Pt arrived on NRB, pt pulling off mask to cough forcefully.  EMS unable to obtain IV

## 2019-08-08 NOTE — ED Notes (Signed)
Pt continues to repeatedly pull of mask and oxygen, pt encouraged to keep 02 on to assist with breathing. Continuous coughing noted.  RT at bedside to assist with MDI.

## 2019-08-08 NOTE — ED Notes (Signed)
Cormac Wint [phone] son Gunther Zawadzki  [phone] wife  Aware patient just arrived wanted to provide their numbers

## 2019-08-09 ENCOUNTER — Encounter (HOSPITAL_COMMUNITY): Payer: Self-pay

## 2019-08-09 ENCOUNTER — Inpatient Hospital Stay (HOSPITAL_COMMUNITY): Payer: Medicare Other

## 2019-08-09 ENCOUNTER — Other Ambulatory Visit: Payer: Self-pay

## 2019-08-09 DIAGNOSIS — I5023 Acute on chronic systolic (congestive) heart failure: Secondary | ICD-10-CM

## 2019-08-09 DIAGNOSIS — R296 Repeated falls: Secondary | ICD-10-CM

## 2019-08-09 DIAGNOSIS — J9621 Acute and chronic respiratory failure with hypoxia: Secondary | ICD-10-CM

## 2019-08-09 DIAGNOSIS — I340 Nonrheumatic mitral (valve) insufficiency: Secondary | ICD-10-CM

## 2019-08-09 DIAGNOSIS — I361 Nonrheumatic tricuspid (valve) insufficiency: Secondary | ICD-10-CM

## 2019-08-09 LAB — CBC WITH DIFFERENTIAL/PLATELET
Abs Immature Granulocytes: 0.04 10*3/uL (ref 0.00–0.07)
Basophils Absolute: 0 10*3/uL (ref 0.0–0.1)
Basophils Relative: 0 %
Eosinophils Absolute: 0 10*3/uL (ref 0.0–0.5)
Eosinophils Relative: 0 %
HCT: 28.9 % — ABNORMAL LOW (ref 39.0–52.0)
Hemoglobin: 8.8 g/dL — ABNORMAL LOW (ref 13.0–17.0)
Immature Granulocytes: 1 %
Lymphocytes Relative: 8 %
Lymphs Abs: 0.2 10*3/uL — ABNORMAL LOW (ref 0.7–4.0)
MCH: 31.3 pg (ref 26.0–34.0)
MCHC: 30.4 g/dL (ref 30.0–36.0)
MCV: 102.8 fL — ABNORMAL HIGH (ref 80.0–100.0)
Monocytes Absolute: 0.2 10*3/uL (ref 0.1–1.0)
Monocytes Relative: 6 %
Neutro Abs: 2.7 10*3/uL (ref 1.7–7.7)
Neutrophils Relative %: 85 %
Platelets: 46 10*3/uL — ABNORMAL LOW (ref 150–400)
RBC: 2.81 MIL/uL — ABNORMAL LOW (ref 4.22–5.81)
RDW: 19.2 % — ABNORMAL HIGH (ref 11.5–15.5)
WBC: 3.2 10*3/uL — ABNORMAL LOW (ref 4.0–10.5)
nRBC: 0 % (ref 0.0–0.2)

## 2019-08-09 LAB — FERRITIN: Ferritin: 2220 ng/mL — ABNORMAL HIGH (ref 24–336)

## 2019-08-09 LAB — CBC
HCT: 29.4 % — ABNORMAL LOW (ref 39.0–52.0)
Hemoglobin: 9.1 g/dL — ABNORMAL LOW (ref 13.0–17.0)
MCH: 31.6 pg (ref 26.0–34.0)
MCHC: 31 g/dL (ref 30.0–36.0)
MCV: 102.1 fL — ABNORMAL HIGH (ref 80.0–100.0)
Platelets: 48 10*3/uL — ABNORMAL LOW (ref 150–400)
RBC: 2.88 MIL/uL — ABNORMAL LOW (ref 4.22–5.81)
RDW: 19.4 % — ABNORMAL HIGH (ref 11.5–15.5)
WBC: 3.1 10*3/uL — ABNORMAL LOW (ref 4.0–10.5)
nRBC: 0 % (ref 0.0–0.2)

## 2019-08-09 LAB — GLUCOSE, CAPILLARY
Glucose-Capillary: 83 mg/dL (ref 70–99)
Glucose-Capillary: 167 mg/dL — ABNORMAL HIGH (ref 70–99)
Glucose-Capillary: 116 mg/dL — ABNORMAL HIGH (ref 70–99)

## 2019-08-09 LAB — BASIC METABOLIC PANEL
Anion gap: 21 — ABNORMAL HIGH (ref 5–15)
BUN: 48 mg/dL — ABNORMAL HIGH (ref 8–23)
CO2: 24 mmol/L (ref 22–32)
Calcium: 9.1 mg/dL (ref 8.9–10.3)
Chloride: 94 mmol/L — ABNORMAL LOW (ref 98–111)
Creatinine, Ser: 8.14 mg/dL — ABNORMAL HIGH (ref 0.61–1.24)
GFR calc Af Amer: 6 mL/min — ABNORMAL LOW (ref >60)
GFR calc non Af Amer: 5 mL/min — ABNORMAL LOW (ref >60)
Glucose, Bld: 154 mg/dL — ABNORMAL HIGH (ref 70–99)
Potassium: 4.6 mmol/L (ref 3.5–5.1)
Sodium: 139 mmol/L (ref 135–145)

## 2019-08-09 LAB — HEPATITIS B SURFACE ANTIGEN: Hepatitis B Surface Ag: NONREACTIVE

## 2019-08-09 LAB — TROPONIN I (HIGH SENSITIVITY): Troponin I (High Sensitivity): 133 ng/L (ref <18)

## 2019-08-09 LAB — HEMOGLOBIN A1C
Hgb A1c MFr Bld: 4.8 % (ref 4.8–5.6)
Mean Plasma Glucose: 91.06 mg/dL

## 2019-08-09 LAB — CREATININE, SERUM
Creatinine, Ser: 6.3 mg/dL — ABNORMAL HIGH (ref 0.61–1.24)
GFR calc Af Amer: 9 mL/min — ABNORMAL LOW (ref >60)
GFR calc non Af Amer: 7 mL/min — ABNORMAL LOW (ref >60)

## 2019-08-09 LAB — CBG MONITORING, ED: Glucose-Capillary: 113 mg/dL — ABNORMAL HIGH (ref 70–99)

## 2019-08-09 LAB — C-REACTIVE PROTEIN: CRP: 6.3 mg/dL — ABNORMAL HIGH (ref <1.0)

## 2019-08-09 LAB — LACTIC ACID, PLASMA
Lactic Acid, Venous: 1.7 mmol/L (ref 0.5–1.9)
Lactic Acid, Venous: 2.9 mmol/L (ref 0.5–1.9)

## 2019-08-09 LAB — ECHOCARDIOGRAM COMPLETE: Weight: 3209.9 oz

## 2019-08-09 MED ORDER — RENA-VITE PO TABS
1.0000 | ORAL_TABLET | Freq: Every day | ORAL | Status: DC
  Administered 2019-08-09 – 2019-08-12 (×4): 1 via ORAL
  Filled 2019-08-09 (×4): qty 1

## 2019-08-09 MED ORDER — CALCIUM ACETATE (PHOS BINDER) 667 MG PO CAPS
1334.0000 mg | ORAL_CAPSULE | Freq: Three times a day (TID) | ORAL | Status: DC
  Administered 2019-08-09 – 2019-08-11 (×4): 1334 mg via ORAL
  Filled 2019-08-09 (×4): qty 2

## 2019-08-09 MED ORDER — ATORVASTATIN CALCIUM 80 MG PO TABS
80.0000 mg | ORAL_TABLET | Freq: Every evening | ORAL | Status: DC
  Administered 2019-08-09 – 2019-08-12 (×4): 80 mg via ORAL
  Filled 2019-08-09 (×4): qty 1

## 2019-08-09 MED ORDER — FLUTICASONE FUROATE-VILANTEROL 200-25 MCG/INH IN AEPB
1.0000 | INHALATION_SPRAY | Freq: Every day | RESPIRATORY_TRACT | Status: DC
  Administered 2019-08-11 – 2019-08-12 (×2): 1 via RESPIRATORY_TRACT
  Filled 2019-08-09: qty 28

## 2019-08-09 MED ORDER — AMIODARONE HCL 200 MG PO TABS
200.0000 mg | ORAL_TABLET | Freq: Every day | ORAL | Status: DC
  Administered 2019-08-09 – 2019-08-12 (×4): 200 mg via ORAL
  Filled 2019-08-09 (×4): qty 1

## 2019-08-09 MED ORDER — PANTOPRAZOLE SODIUM 40 MG PO TBEC
40.0000 mg | DELAYED_RELEASE_TABLET | Freq: Every day | ORAL | Status: DC
  Administered 2019-08-09 – 2019-08-12 (×4): 40 mg via ORAL
  Filled 2019-08-09 (×4): qty 1

## 2019-08-09 MED ORDER — SODIUM CHLORIDE 0.9% FLUSH: 3.0000 mL | INTRAVENOUS | Status: DC | PRN

## 2019-08-09 MED ORDER — ONDANSETRON HCL 4 MG/2ML IJ SOLN: 4.0000 mg | Freq: Four times a day (QID) | INTRAMUSCULAR | Status: DC | PRN

## 2019-08-09 MED ORDER — ALBUTEROL SULFATE (2.5 MG/3ML) 0.083% IN NEBU
3.0000 mL | INHALATION_SOLUTION | RESPIRATORY_TRACT | Status: DC | PRN
  Administered 2019-08-09 – 2019-08-11 (×2): 3 mL via RESPIRATORY_TRACT
  Filled 2019-08-09: qty 3

## 2019-08-09 MED ORDER — CLOPIDOGREL BISULFATE 75 MG PO TABS
75.0000 mg | ORAL_TABLET | Freq: Every day | ORAL | Status: DC
  Administered 2019-08-09 – 2019-08-12 (×4): 75 mg via ORAL
  Filled 2019-08-09 (×4): qty 1

## 2019-08-09 MED ORDER — SODIUM CHLORIDE 0.9 % IV SOLN: 250.0000 mL | INTRAVENOUS | Status: DC | PRN

## 2019-08-09 MED ORDER — INSULIN ASPART 100 UNIT/ML ~~LOC~~ SOLN: 0.0000 [IU] | Freq: Three times a day (TID) | SUBCUTANEOUS | Status: DC

## 2019-08-09 MED ORDER — CALCIUM CARBONATE ANTACID 500 MG PO CHEW: 1.0000 | CHEWABLE_TABLET | Freq: Two times a day (BID) | ORAL | Status: DC | PRN

## 2019-08-09 MED ORDER — ASPIRIN EC 81 MG PO TBEC
81.0000 mg | DELAYED_RELEASE_TABLET | Freq: Every day | ORAL | Status: DC
  Administered 2019-08-09 – 2019-08-12 (×4): 81 mg via ORAL
  Filled 2019-08-09 (×4): qty 1

## 2019-08-09 MED ORDER — SODIUM CHLORIDE 0.9% FLUSH
3.0000 mL | Freq: Two times a day (BID) | INTRAVENOUS | Status: DC
  Administered 2019-08-09 – 2019-08-12 (×8): 3 mL via INTRAVENOUS

## 2019-08-09 MED ORDER — POLYETHYLENE GLYCOL 3350 17 G PO PACK: 17.0000 g | PACK | Freq: Every day | ORAL | Status: DC | PRN

## 2019-08-09 MED ORDER — INSULIN ASPART 100 UNIT/ML ~~LOC~~ SOLN: 0.0000 [IU] | Freq: Every day | SUBCUTANEOUS | Status: DC

## 2019-08-09 MED ORDER — ALLOPURINOL 100 MG PO TABS
100.0000 mg | ORAL_TABLET | Freq: Every day | ORAL | Status: DC
  Administered 2019-08-09 – 2019-08-12 (×4): 100 mg via ORAL
  Filled 2019-08-09 (×4): qty 1

## 2019-08-09 MED ORDER — INSULIN ASPART 100 UNIT/ML ~~LOC~~ SOLN
0.0000 [IU] | Freq: Three times a day (TID) | SUBCUTANEOUS | Status: DC
  Administered 2019-08-09: 1 [IU] via SUBCUTANEOUS

## 2019-08-09 MED ORDER — ACETAMINOPHEN 325 MG PO TABS
650.0000 mg | ORAL_TABLET | ORAL | Status: DC | PRN
  Administered 2019-08-09 (×4): 650 mg via ORAL
  Filled 2019-08-09 (×4): qty 2

## 2019-08-09 MED ORDER — MUPIROCIN 2 % EX OINT
1.0000 "application " | TOPICAL_OINTMENT | Freq: Two times a day (BID) | CUTANEOUS | Status: DC
  Administered 2019-08-10 – 2019-08-12 (×7): 1 via TOPICAL
  Filled 2019-08-09 (×2): qty 22

## 2019-08-09 MED ORDER — CALCITRIOL 0.25 MCG PO CAPS
0.2500 ug | ORAL_CAPSULE | ORAL | Status: DC
  Administered 2019-08-11: 0.25 ug via ORAL
  Filled 2019-08-09: qty 1

## 2019-08-09 MED ORDER — HEPARIN SODIUM (PORCINE) 5000 UNIT/ML IJ SOLN
5000.0000 [IU] | Freq: Three times a day (TID) | INTRAMUSCULAR | Status: DC
  Administered 2019-08-09 (×2): 5000 [IU] via SUBCUTANEOUS
  Filled 2019-08-09 (×2): qty 1

## 2019-08-09 NOTE — Evaluation (Signed)
Physical Therapy Evaluation Patient Details Name: Shane [name] MRN: [id number] DOB: 1934-06-12 Today's Date: 08/09/2019   History of Present Illness  86 yo admitted with SOB with CHF exacerbation. PMHx: ESRD on HD MWF, AICD, CHF, HTN, DM, HLD, CAD, BPH, PVD, gout, RT TKA  Clinical Impression  Pt pleasant and reports being hungry after HD and no breakfast. Pt assisted to call to order lunch end of session. Pt reports repeated falls at home with inability to rise and need for son assist. Pt reports bil knees will buckle without warning and he broke his left shoulder 6 wks ago awaiting sx. Pt with decreased functional mobility with increased need for wife assist x 2 weeks, frequent falls and will benefit from acute therapy to maximize mobility, safety and function.     Follow Up Recommendations Home health PT(pt already active with HHPT)    Equipment Recommendations  None recommended by PT    Recommendations for Other Services OT consult     Precautions / Restrictions Precautions Precautions: Fall Precaution Comments: pt with left shoulder fx and rotator cuff tear awaiting sx on the 7th per pt      Mobility  Bed Mobility Overal bed mobility: Needs Assistance Bed Mobility: Supine to Sit     Supine to sit: Min assist     General bed mobility comments: with rail, cues for sequence to roll left and assist to elevate trunk. increased time and difficulty given left shoulder fx  Transfers Overall transfer level: Needs assistance   Transfers: Sit to/from Stand Sit to Stand: Min guard         General transfer comment: guarding with cues to control descent  Ambulation/Gait Ambulation/Gait assistance: Min guard Gait Distance (Feet): 70 Feet Assistive device: Rolling walker (2 wheeled) Gait Pattern/deviations: Step-through pattern;Decreased stride length;Trunk flexed   Gait velocity interpretation: 1.31 - 2.62 ft/sec, indicative of limited community ambulator General Gait  Details: Pt with no SOB with pulse ox unable to read throughout session with pt maintained on 2L throughout. pt with slow gait with flexed trunk with cues for self monitoring  Stairs            Wheelchair Mobility    Modified Rankin (Stroke Patients Only)       Balance Overall balance assessment: History of Falls                                           Pertinent Vitals/Pain Pain Assessment: No/denies pain    Home Living Family/patient expects to be discharged to:: Private residence Living Arrangements: Spouse/significant other Available Help at Discharge: Family;Available 24 hours/day Type of Home: House Home Access: Stairs to enter   [employer] of Steps: 1 Home Layout: Two level;Able to live on main level with bedroom/bathroom;Laundry or work area in [place]: Environmental consultant - 2 wheels;Cane - single point;Wheelchair - manual;Toilet riser;Shower seat - built in;Hand held shower head;Grab bars - tub/shower      Prior Function Level of Independence: Needs assistance   Gait / Transfers Assistance Needed: walks with  walker,  reports knee buckling and 6 falls in the last 6 months  ADL's / Homemaking Assistance Needed: wife assisting with lower body bathing and dressing  x 2 weeks        Hand Dominance        Extremity/Trunk Assessment   Upper Extremity Assessment Upper Extremity Assessment:  Generalized weakness    Lower Extremity Assessment Lower Extremity Assessment: Generalized weakness    Cervical / Trunk Assessment Cervical / Trunk Assessment: Kyphotic  Communication   Communication: No difficulties  Cognition Arousal/Alertness: Awake/alert Behavior During Therapy: WFL for tasks assessed/performed Overall Cognitive Status: Within Functional Limits for tasks assessed                                        General Comments General comments (skin integrity, edema, etc.): pt reports 6 falls in  last 6 months with son Engineer, drilling) having to assist with getting him off the floor    Exercises     Assessment/Plan    PT Assessment Patient needs continued PT services  PT Problem List Decreased mobility;Decreased activity tolerance;Decreased balance;Decreased knowledge of use of DME       PT Treatment Interventions Gait training;Stair training;Functional mobility training;Therapeutic activities;Patient/family education;DME instruction;Therapeutic exercise    PT Goals (Current goals can be found in the Care Plan section)  Acute Rehab PT Goals Patient Stated Goal: drive PT Goal Formulation: With patient Time For Goal Achievement: 08/23/19 Potential to Achieve Goals: Good    Frequency Min 3X/week   Barriers to discharge Decreased caregiver support      Co-evaluation               AM-PAC PT "6 Clicks" Mobility  Outcome Measure Help needed turning from your back to your side while in a flat bed without using bedrails?: A Little Help needed moving from lying on your back to sitting on the side of a flat bed without using bedrails?: A Little Help needed moving to and from a bed to a chair (including a wheelchair)?: A Little Help needed standing up from a chair using your arms (e.g., wheelchair or bedside chair)?: A Little Help needed to walk in hospital room?: A Little Help needed climbing 3-5 steps with a railing? : A Little 6 Click Score: 18    End of Session Equipment Utilized During Treatment: Gait belt;Oxygen Activity Tolerance: Patient tolerated treatment well Patient left: in chair;with call bell/phone within reach;with chair alarm set Nurse Communication: Mobility status PT Visit Diagnosis: Other abnormalities of gait and mobility (R26.89);History of falling (Z91.81)    Time: 7858-8502 PT Time Calculation (min) (ACUTE ONLY): 24 min   Charges:   PT Evaluation $PT Eval Moderate Complexity: 1 Mod          Dorla Guizar P, PT Acute Rehabilitation  Services Pager: [phone] Office: [phone]   Shane [name] 08/09/2019, 12:32 PM

## 2019-08-09 NOTE — Progress Notes (Signed)
Patient temp 101.9 with tremors and shortness of breath. Will page provider.

## 2019-08-09 NOTE — Procedures (Signed)
Patient was seen on dialysis and the procedure was supervised.  BFR 400, DFR 800  Via AVF BP is 105/59.    Patient appears to be tolerating treatment well.  Shane [name] Tanna Furry 08/09/2019

## 2019-08-09 NOTE — ED Notes (Signed)
Dr Garba at bedside 

## 2019-08-09 NOTE — Significant Event (Signed)
Patient experiencing  Tremors with sob patient states that he has them often. Had Proventil inhaler at the bedside with spacer.

## 2019-08-09 NOTE — Progress Notes (Signed)
  Echocardiogram 2D Echocardiogram has been performed.  Shane [name] 08/09/2019, 3:43 PM

## 2019-08-09 NOTE — Progress Notes (Signed)
PROGRESS NOTE   BEVERLY SURIANO  YDX:[id number]    DOB: 08-28-1933    DOA: 08/28/2019  PCP: [name], MD   I have briefly reviewed patients previous medical records in [facility].  Chief Complaint:   Chief Complaint  Patient presents with  . Shortness of Breath    Brief Narrative:  86 year old married male, lives with spouse and granddaughter, ambulates with the help of walker, PMH of ESRD on MWF HD, chronic systolic CHF, last LVEF 72-09% in 2019, s/p AICD, HTN, DM 2, HLD, CAD, chronic hypoxic respiratory failure on home oxygen 2 L/min at night, OSA reportedly cannot tolerate CPAP, frequent falls (6-7 times in the last 6 weeks), reported left shoulder fracture and right little toe fracture following a fall, presented to [facility] ED on 1/3 due to progressive dyspnea, hypoxia, lower extremity edema despite compliance with his outpatient HD.  Admitted for acute on chronic hypoxic respiratory failure due to decompensated CHF in a dialysis patient.  Nephrology consulted, s/p HD on 1/4 with improvement.  PT consulted.   Assessment & Plan:  Principal Problem:   Acute exacerbation of CHF (congestive heart failure) (HCC) Active Problems:   Hypercholesterolemia   DM (diabetes mellitus), type 2 with renal complications (HCC)   Benign hypertensive heart disease without heart failure   Severe obstructive sleep apnea   Chronic combined systolic and diastolic CHF (congestive heart failure) (HCC)   ICD (implantable cardioverter-defibrillator) discharge   ESRD (end stage renal disease) (HCC)   GERD (gastroesophageal reflux disease)   Acute on chronic hypoxic respiratory failure  Due to decompensated CHF.  Reports that he suffocates and hence does not use CPAP at night but is on home oxygen 2 L/min at bedtime only.  Saturation 93% on room air and tachypneic on admission.  Secondary to decompensated CHF.  Breathing improved post dialysis.  Wean oxygen as tolerated to room air if possible in  the daytime but continue nightly oxygen 2 L/min as per prior.  Acute on chronic systolic CHF  In a dialysis patient.  Volume management across dialysis.  Reports compliance with outpatient HD.  Suspect that his dialysis parameters have to be adjusted by nephrology.  Follow repeat TTE results.  ESRD on MWF HD  Nephrology consulted and s/p HD on 1/4 but still quite volume overloaded.  Discussed with Dr. Carolin Sicks to consider serial HD for volume management.  OSA, intolerant of CPAP  Continue nightly oxygen 2 L/min at bedtime.  Frequent falls  Reports that he is fallen 6-7 times in the last 6 weeks, broken his left shoulder (Dr. Linda Hedges, orthopedics) and right little toe.  Indicates that his knees give away.  Unable to use left upper extremity sling due to need to use walker.  PT evaluation.  No pain in left shoulder or right little toe reported.  Left shoulder fracture and right little toe fracture  Outpatient follow-up with orthopedics.  Type II DM with renal complications  Reasonable inpatient control but change SSI to sensitive to avoid hypoglycemia.  Essential hypertension  Soft blood pressures.  Not on antihypertensives.  GERD  PPI.  Hyperlipidemia  PAD   Reports stenting left lower extremity by Dr. [name], cardiology.  Left foot slightly cool but no acute ischemic changes.  Diminished pulses.  Will get ABI.  Pancytopenia  Unclear etiology.  Also has macrocytosis.  Hemoglobin appears stable compared to his baseline.  Platelet count seems to have gradually declined over the last year, down to 46.?  Bone marrow issues and may need outpatient hematology consultation.  Discontinued heparin DVT prophylaxis.  SCDs.   DVT prophylaxis: SCD. Code Status: Full Family Communication: None at bedside Disposition: To be determined pending clinical improvement.   Consultants:   Nephrology  Procedures:   HD 1/4  Antimicrobials:   None   Subjective:    Patient seen post dialysis in his room.  RN at bedside.  Denies pain especially in his left shoulder or right toe.  Dyspnea significantly improved but breathing not yet at baseline.  No chest pain.  Reports frequent falls at home up to a couple days prior to admission.  Reportedly has home health physical therapy coming home.  Objective:   Vitals:   08/09/19 0930 08/09/19 1000 08/09/19 1026 08/09/19 1253  BP: 100/62 (!) 99/48 (!) 95/54 (!) 103/58  Pulse: 82 83 83 88  Resp:   18 19  Temp:   98.6 F (37 C) 98.5 F (36.9 C)  TempSrc:   Oral   SpO2:   98% 100%  Weight:   91 kg     General exam: Elderly male, moderately built and overweight lying comfortably propped up in bed without distress. Respiratory system: Diminished breath sounds in the bases with occasional basal crackles.  Rest of lung fields clear to auscultation.  No increased work of breathing. Cardiovascular system: S1 & S2 heard, RRR. No JVD, murmurs, rubs, gallops or clicks.  1+ pitting edema.  Telemetry personally reviewed: Ventricular paced rhythm. Gastrointestinal system: Abdomen is nondistended, soft and nontender. No organomegaly or masses felt. Normal bowel sounds heard. Central nervous system: Alert and oriented. No focal neurological deficits. Extremities: Symmetric 5 x 5 power.  Limited left upper movement at shoulder.  Appears to have an ulcer over his right little toe without acute findings.  Right upper arm AV fistula thrill. Skin: Subacute fist-sized bruise over left buttock. Psychiatry: Judgement and insight appear normal. Mood & affect appropriate.     Data Reviewed:   I have personally reviewed following labs and imaging studies   CBC: Recent Labs  Lab 08/16/2019 2045 08/22/2019 2101 08/09/19 0120 08/09/19 0509  WBC 4.2  --  3.1* 3.2*  NEUTROABS 2.6  --   --  2.7  HGB 10.0* 12.2* 9.1* 8.8*  HCT 34.1* 36.0* 29.4* 28.9*  MCV 105.2*  --  102.1* 102.8*  PLT 61*  --  48* 46*    Basic Metabolic  Panel: Recent Labs  Lab 09/04/2019 2045 08/12/2019 2101 08/09/19 0120 08/09/19 0509  NA 135 141  --  139  K 4.2 4.2  --  4.6  CL 93* 97*  --  94*  CO2 24  --   --  24  GLUCOSE 139* 135*  --  154*  BUN 41* 43*  --  48*  CREATININE 5.93* 6.00* 6.30* 8.14*  CALCIUM 9.0  --   --  9.1    Liver Function Tests: Recent Labs  Lab 08/23/2019 2045  AST 54*  ALT 30  ALKPHOS 110  BILITOT 2.0*  PROT 7.2  ALBUMIN 3.1*    CBG: Recent Labs  Lab 08/09/19 0128 08/09/19 1136 08/09/19 1603  GLUCAP 113* 83 167*    Microbiology Studies:   Recent Results (from the past 240 hour(s))  Blood Culture (routine x 2)     Status: None (Preliminary result)   Collection Time: 08/30/2019  8:50 PM   Specimen: BLOOD LEFT FOREARM  Result Value Ref Range Status   Specimen Description BLOOD LEFT FOREARM  Final   Special Requests   Final    BOTTLES DRAWN AEROBIC AND ANAEROBIC Blood Culture adequate volume   Culture   Final    NO GROWTH < 24 HOURS Performed at [facility], [address] [place], [place] [address]    Report Status PENDING  Incomplete  Respiratory Panel by RT PCR (Flu A&B, Covid) - Nasopharyngeal Swab     Status: None   Collection Time: 08/21/2019 10:11 PM   Specimen: Nasopharyngeal Swab  Result Value Ref Range Status   SARS Coronavirus 2 by RT PCR NEGATIVE NEGATIVE Final    Comment: (NOTE) SARS-CoV-2 target nucleic acids are NOT DETECTED. The SARS-CoV-2 RNA is generally detectable in upper respiratoy specimens during the acute phase of infection. The lowest concentration of SARS-CoV-2 viral copies this assay can detect is 131 copies/mL. A negative result does not preclude SARS-Cov-2 infection and should not be used as the sole basis for treatment or other patient management decisions. A negative result may occur with  improper specimen collection/handling, submission of specimen other than nasopharyngeal swab, presence of viral mutation(s) within the areas targeted by this  assay, and inadequate number of viral copies (<131 copies/mL). A negative result must be combined with clinical observations, patient history, and epidemiological information. The expected result is Negative. Fact Sheet for Patients:  PinkCheek.be Fact Sheet for Healthcare Providers:  GravelBags.it This test is not yet ap proved or cleared by the [place] FDA and  has been authorized for detection and/or diagnosis of SARS-CoV-2 by FDA under an Emergency Use Authorization (EUA). This EUA will remain  in effect (meaning this test can be used) for the duration of the COVID-19 declaration under Section 564(b)(1) of the Act, 21 U.S.C. section 360bbb-3(b)(1), unless the authorization is terminated or revoked sooner.    Influenza A by PCR NEGATIVE NEGATIVE Final   Influenza B by PCR NEGATIVE NEGATIVE Final    Comment: (NOTE) The Xpert Xpress SARS-CoV-2/FLU/RSV assay is intended as an aid in  the diagnosis of influenza from Nasopharyngeal swab specimens and  should not be used as a sole basis for treatment. Nasal washings and  aspirates are unacceptable for Xpert Xpress SARS-CoV-2/FLU/RSV  testing. Fact Sheet for Patients: PinkCheek.be Fact Sheet for Healthcare Providers: GravelBags.it This test is not yet approved or cleared by the [place] FDA and  has been authorized for detection and/or diagnosis of SARS-CoV-2 by  FDA under an Emergency Use Authorization (EUA). This EUA will remain  in effect (meaning this test can be used) for the duration of the  Covid-19 declaration under Section 564(b)(1) of the Act, 21  U.S.C. section 360bbb-3(b)(1), unless the authorization is  terminated or revoked. Performed at [facility], [place] [address] [place], [place] [address]      Radiology Studies:  DG Chest Portable 1 View  Result Date: 08/07/2019 CLINICAL DATA:   Shortness of breath. EXAM: PORTABLE CHEST 1 VIEW COMPARISON:  02/07/2019 FINDINGS: Previous median sternotomy, CABG and pacemaker/AICD placement. Poor inspiration. Interstitial edema. Basilar volume loss. Findings most consistent with heart failure. IMPRESSION: Probable congestive heart failure. Cardiomegaly. Venous hypertension and interstitial edema. Poor inspiration with basilar volume loss. Electronically Signed   By: Nelson Chimes M.D.   On: 08/17/2019 21:31     Scheduled Meds:   . Chlorhexidine Gluconate Cloth  6 each Topical Q0600  . heparin  5,000 Units Subcutaneous Q8H  . insulin aspart  0-15 Units Subcutaneous TID WC  . insulin aspart  0-5 Units  Subcutaneous QHS  . sodium chloride flush  3 mL Intravenous Q12H    Continuous Infusions:   . sodium chloride       LOS: 1 day     Vernell Leep, MD, [place], [facility]. [facility]    To contact the attending provider between 7A-7P or the covering provider during after hours 7P-7A, please log into the web site [email or web address] and access using universal [place] password for that web site. If you do not have the password, please call the hospital operator.  08/09/2019, 4:06 PM

## 2019-08-09 NOTE — ED Notes (Signed)
Breakfast ordered 

## 2019-08-09 NOTE — Progress Notes (Signed)
Patient temp 98.2. with tremors Spoke with  Dr. Algis Liming the plan is to recheck temp in 1hr.

## 2019-08-10 ENCOUNTER — Telehealth: Payer: Self-pay | Admitting: Internal Medicine

## 2019-08-10 ENCOUNTER — Inpatient Hospital Stay (HOSPITAL_COMMUNITY): Payer: Medicare Other

## 2019-08-10 ENCOUNTER — Ambulatory Visit: Payer: Medicare Other

## 2019-08-10 DIAGNOSIS — M1710 Unilateral primary osteoarthritis, unspecified knee: Secondary | ICD-10-CM

## 2019-08-10 DIAGNOSIS — J181 Lobar pneumonia, unspecified organism: Secondary | ICD-10-CM

## 2019-08-10 DIAGNOSIS — I5043 Acute on chronic combined systolic (congestive) and diastolic (congestive) heart failure: Secondary | ICD-10-CM

## 2019-08-10 LAB — CBC
HCT: 26.8 % — ABNORMAL LOW (ref 39.0–52.0)
HCT: 29 % — ABNORMAL LOW (ref 39.0–52.0)
HCT: 29.1 % — ABNORMAL LOW (ref 39.0–52.0)
Hemoglobin: 8.6 g/dL — ABNORMAL LOW (ref 13.0–17.0)
Hemoglobin: 8.9 g/dL — ABNORMAL LOW (ref 13.0–17.0)
Hemoglobin: 9 g/dL — ABNORMAL LOW (ref 13.0–17.0)
MCH: 30.9 pg (ref 26.0–34.0)
MCH: 31.4 pg (ref 26.0–34.0)
MCH: 31.7 pg (ref 26.0–34.0)
MCHC: 30.7 g/dL (ref 30.0–36.0)
MCHC: 30.9 g/dL (ref 30.0–36.0)
MCHC: 32.1 g/dL (ref 30.0–36.0)
MCV: 100.7 fL — ABNORMAL HIGH (ref 80.0–100.0)
MCV: 102.5 fL — ABNORMAL HIGH (ref 80.0–100.0)
MCV: 97.8 fL (ref 80.0–100.0)
Platelets: 44 10*3/uL — ABNORMAL LOW (ref 150–400)
Platelets: 46 10*3/uL — ABNORMAL LOW (ref 150–400)
Platelets: 49 10*3/uL — ABNORMAL LOW (ref 150–400)
RBC: 2.74 MIL/uL — ABNORMAL LOW (ref 4.22–5.81)
RBC: 2.84 MIL/uL — ABNORMAL LOW (ref 4.22–5.81)
RBC: 2.88 MIL/uL — ABNORMAL LOW (ref 4.22–5.81)
RDW: 18.8 % — ABNORMAL HIGH (ref 11.5–15.5)
RDW: 18.8 % — ABNORMAL HIGH (ref 11.5–15.5)
RDW: 19.1 % — ABNORMAL HIGH (ref 11.5–15.5)
WBC: 4.5 10*3/uL (ref 4.0–10.5)
WBC: 4.9 10*3/uL (ref 4.0–10.5)
WBC: 5.3 10*3/uL (ref 4.0–10.5)
nRBC: 0 % (ref 0.0–0.2)
nRBC: 0 % (ref 0.0–0.2)
nRBC: 0 % (ref 0.0–0.2)

## 2019-08-10 LAB — RENAL FUNCTION PANEL
Albumin: 2.4 g/dL — ABNORMAL LOW (ref 3.5–5.0)
Anion gap: 15 (ref 5–15)
BUN: 29 mg/dL — ABNORMAL HIGH (ref 8–23)
CO2: 25 mmol/L (ref 22–32)
Calcium: 8.8 mg/dL — ABNORMAL LOW (ref 8.9–10.3)
Chloride: 96 mmol/L — ABNORMAL LOW (ref 98–111)
Creatinine, Ser: 4.26 mg/dL — ABNORMAL HIGH (ref 0.61–1.24)
GFR calc Af Amer: 14 mL/min — ABNORMAL LOW (ref >60)
GFR calc non Af Amer: 12 mL/min — ABNORMAL LOW (ref >60)
Glucose, Bld: 98 mg/dL (ref 70–99)
Phosphorus: 2.5 mg/dL (ref 2.5–4.6)
Potassium: 3.9 mmol/L (ref 3.5–5.1)
Sodium: 136 mmol/L (ref 135–145)

## 2019-08-10 LAB — GLUCOSE, CAPILLARY
Glucose-Capillary: 50 mg/dL — ABNORMAL LOW (ref 70–99)
Glucose-Capillary: 136 mg/dL — ABNORMAL HIGH (ref 70–99)
Glucose-Capillary: 49 mg/dL — ABNORMAL LOW (ref 70–99)
Glucose-Capillary: 80 mg/dL (ref 70–99)
Glucose-Capillary: 84 mg/dL (ref 70–99)
Glucose-Capillary: 27 mg/dL — CL (ref 70–99)
Glucose-Capillary: 34 mg/dL — CL (ref 70–99)
Glucose-Capillary: 63 mg/dL — ABNORMAL LOW (ref 70–99)
Glucose-Capillary: 85 mg/dL (ref 70–99)
Glucose-Capillary: 86 mg/dL (ref 70–99)

## 2019-08-10 LAB — GRAM STAIN

## 2019-08-10 LAB — C-REACTIVE PROTEIN: CRP: 26.8 mg/dL — ABNORMAL HIGH (ref <1.0)

## 2019-08-10 LAB — SYNOVIAL CELL COUNT + DIFF, W/ CRYSTALS
Eosinophils-Synovial: 0 % (ref 0–1)
Lymphocytes-Synovial Fld: 6 % (ref 0–20)
Monocyte-Macrophage-Synovial Fluid: 1 % — ABNORMAL LOW (ref 50–90)
Neutrophil, Synovial: 93 % — ABNORMAL HIGH (ref 0–25)
WBC, Synovial: 159500 /mm3 — ABNORMAL HIGH (ref 0–200)

## 2019-08-10 LAB — MRSA PCR SCREENING: MRSA by PCR: NEGATIVE

## 2019-08-10 LAB — LACTIC ACID, PLASMA: Lactic Acid, Venous: 2 mmol/L (ref 0.5–1.9)

## 2019-08-10 LAB — SEDIMENTATION RATE: Sed Rate: 92 mm/hr — ABNORMAL HIGH (ref 0–16)

## 2019-08-10 MED ORDER — LIDOCAINE-PRILOCAINE 2.5-2.5 % EX CREA: 1.0000 "application " | TOPICAL_CREAM | CUTANEOUS | Status: DC | PRN

## 2019-08-10 MED ORDER — CHLORHEXIDINE GLUCONATE CLOTH 2 % EX PADS: 6.0000 | MEDICATED_PAD | Freq: Every day | CUTANEOUS | Status: DC

## 2019-08-10 MED ORDER — ACETAMINOPHEN 325 MG PO TABS
650.0000 mg | ORAL_TABLET | ORAL | Status: DC | PRN
  Administered 2019-08-11 – 2019-08-12 (×3): 650 mg via ORAL
  Filled 2019-08-10 (×4): qty 2

## 2019-08-10 MED ORDER — DEXTROSE 50 % IV SOLN
INTRAVENOUS | Status: AC
  Administered 2019-08-10: 25 g via INTRAVENOUS
  Filled 2019-08-10: qty 50

## 2019-08-10 MED ORDER — VANCOMYCIN HCL 1750 MG/350ML IV SOLN
1750.0000 mg | Freq: Once | INTRAVENOUS | Status: AC
  Administered 2019-08-10: 1750 mg via INTRAVENOUS
  Filled 2019-08-10: qty 350

## 2019-08-10 MED ORDER — ALTEPLASE 2 MG IJ SOLR: 2.0000 mg | Freq: Once | INTRAMUSCULAR | Status: DC | PRN

## 2019-08-10 MED ORDER — SODIUM CHLORIDE 0.9 % IV SOLN: 100.0000 mL | INTRAVENOUS | Status: DC | PRN

## 2019-08-10 MED ORDER — SODIUM CHLORIDE 0.9 % IV SOLN
1.0000 g | Freq: Every day | INTRAVENOUS | Status: DC
  Administered 2019-08-10 – 2019-08-11 (×2): 1 g via INTRAVENOUS
  Filled 2019-08-10 (×2): qty 1

## 2019-08-10 MED ORDER — VANCOMYCIN HCL IN DEXTROSE 1-5 GM/200ML-% IV SOLN
1000.0000 mg | INTRAVENOUS | Status: DC
  Administered 2019-08-11: 1000 mg via INTRAVENOUS
  Filled 2019-08-10 (×2): qty 200

## 2019-08-10 MED ORDER — AMITRIPTYLINE HCL 25 MG PO TABS
25.0000 mg | ORAL_TABLET | Freq: Every day | ORAL | Status: DC
  Administered 2019-08-10: 25 mg via ORAL
  Filled 2019-08-10 (×2): qty 1

## 2019-08-10 MED ORDER — PENTAFLUOROPROP-TETRAFLUOROETH EX AERO: 1.0000 "application " | INHALATION_SPRAY | CUTANEOUS | Status: DC | PRN

## 2019-08-10 MED ORDER — GLUCOSE 40 % PO GEL
2.0000 | ORAL | Status: AC
  Administered 2019-08-11: 75 g via ORAL
  Filled 2019-08-10: qty 2

## 2019-08-10 MED ORDER — LIDOCAINE HCL (PF) 1 % IJ SOLN: 5.0000 mL | INTRAMUSCULAR | Status: DC | PRN

## 2019-08-10 MED ORDER — HEPARIN SODIUM (PORCINE) 1000 UNIT/ML DIALYSIS: 1000.0000 [IU] | INTRAMUSCULAR | Status: DC | PRN

## 2019-08-10 MED ORDER — LIDOCAINE-PRILOCAINE 2.5-2.5 % EX CREA
1.0000 "application " | TOPICAL_CREAM | CUTANEOUS | Status: DC | PRN
  Filled 2019-08-10: qty 5

## 2019-08-10 MED ORDER — METHYLPREDNISOLONE ACETATE 80 MG/ML IJ SUSP
80.0000 mg | Freq: Once | INTRAMUSCULAR | Status: AC
  Administered 2019-08-10: 80 mg via INTRA_ARTICULAR
  Filled 2019-08-10: qty 1

## 2019-08-10 MED ORDER — DEXTROSE 50 % IV SOLN: 25.0000 g | INTRAVENOUS | Status: AC

## 2019-08-10 MED ORDER — BUPIVACAINE HCL (PF) 0.5 % IJ SOLN
10.0000 mL | Freq: Once | INTRAMUSCULAR | Status: AC
  Administered 2019-08-10: 10 mL
  Filled 2019-08-10: qty 10

## 2019-08-10 MED ORDER — DEXTROSE 50 % IV SOLN
INTRAVENOUS | Status: AC
  Administered 2019-08-10: 50 mL
  Filled 2019-08-10: qty 50

## 2019-08-10 MED ORDER — FENTANYL CITRATE (PF) 100 MCG/2ML IJ SOLN
12.5000 ug | INTRAMUSCULAR | Status: DC | PRN
  Administered 2019-08-10: 12.5 ug via INTRAVENOUS
  Filled 2019-08-10: qty 2

## 2019-08-10 NOTE — Progress Notes (Signed)
OT Cancellation Note  Patient Details Name: HARMON BOMMARITO MRN: [id number] DOB: 1934-01-15   Cancelled Treatment:    Reason Eval/Treat Not Completed: Patient at procedure or test/ unavailable Pt off the floor at HD. OT will return later as time allows and pt is appropriate.   Dorinda Hill OTR/L Acute Rehabilitation Services Office: [phone]   Wyn Forster 08/10/2019, 12:10 PM

## 2019-08-10 NOTE — Consult Note (Signed)
Reason for Consult:Left knee effusion Referring Physician: A Hongalgi  Shane [name] is an 86 y.o. male.  HPI: Shane [name] has been dealing with pain in his knees, especially his left, for several months. It's been worse in the past few weeks and then, last night while being moved to a BSC, had increased pain in it and it swelled. Orthopedic surgery was consulted this morning. He has been seeing Dr. Veverly Fells for a remote shoulder and foot injury and has seen Dr. Tonita Cong for his knee. He is not in pain lying still in bed.  Past Medical History:  Diagnosis Date  . Abnormal nuclear cardiac imaging test Nov 2010   moderate area of infarct in the inferior wall with only minimal reversibility and EF of 28%  . AICD (automatic cardioverter/defibrillator) present   . Allergic rhinitis 01-08-13   Uses nebulizer for chronic sinus issues and Mucinex.  . Arthritis    osteoarthritis   . Asthma    Extrinic  . Blood transfusion    ? at time of bypass surgery   . BPH (benign prostatic hyperplasia)   . Cellulitis of arm, left    MSSA  . CHF (congestive heart failure) ([place])   . Colon polyps    adenomatous  . Coronary artery disease    remote CABG in 1985, cath in 2003 by Dr. Lia Foyer with no PCI, last nuclear in 2010 showing scar and EF of 28%.   . Diabetes mellitus   . Diabetic neuropathy ([place]) 04/25/2017  . Dyslipidemia   . Dyspnea   . ESRD (end stage renal disease) ([place])   . Falls frequently   . Gait abnormality 04/25/2017  . Glaucoma 01-08-13   tx. eye drops  . Hemodialysis patient ([place])   . HOH (hard of hearing)   . HTN (hypertension)   . Hypercholesterolemia   . Left ventricular dysfunction    28% per nuclear in 2010 and 35 to 40% per echo in 2010  . Myocardial infarction ([place])    1985  . Neuromuscular disorder ([place])    legs mild paralysis-able to walk"nerve damage"-legs- left leg brace   . Neuropathy   . Pneumonia    hx of several times years ago   . PVD (peripheral vascular disease) ([place])     a. s/p PTA/stenting of L CIA 02/2019.  Marland Kitchen Renal artery stenosis ([facility])    a. moderate bilateral by angio 02/2019.  Marland Kitchen Spinal stenosis     Past Surgical History:  Procedure Laterality Date  . A/V FISTULAGRAM Right 10/17/2016   Procedure: A/V Fistulagram;  Surgeon: Conrad [place], MD;  Location: [facility];  Service: Cardiovascular;  Laterality: Right;  . ABDOMINAL AORTOGRAM N/A 02/04/2019   Procedure: ABDOMINAL AORTOGRAM;  Surgeon: Lorretta Harp, MD;  Location: [facility];  Service: Cardiovascular;  Laterality: N/A;  . BACK SURGERY     hx of back surgery x 4   . BASCILIC VEIN TRANSPOSITION Right 09/09/2016   Procedure: BASCILIC VEIN TRANSPOSITION Right Arm;  Surgeon: Rosetta Posner, MD;  [facility];  Service: Vascular;  Laterality: Right;  . BI-VENTRICULAR IMPLANTABLE CARDIOVERTER DEFIBRILLATOR N/A 10/10/2014   Procedure: BI-VENTRICULAR IMPLANTABLE CARDIOVERTER DEFIBRILLATOR  (CRT-D);  Surgeon: [name], MD;  Location: [facility] LAB;  Service: Cardiovascular;  Laterality: N/A;  . CARDIAC CATHETERIZATION  2003  . Cataract      recent cataract surgery 6'14  . COLONOSCOPY N/A 01/25/2013   Procedure: COLONOSCOPY;  Surgeon: Irene Shipper, MD;  Location: [facility];  Service: Endoscopy;  Laterality: N/A;  . CORONARY ARTERY BYPASS GRAFT  1985   x 5 vessels  . LOWER EXTREMITY ANGIOGRAPHY Bilateral 02/04/2019   Procedure: Lower Extremity Angiography;  Surgeon: Lorretta Harp, MD;  Location: [facility];  Service: Cardiovascular;  Laterality: Bilateral;  limited study to iliacs  . LUMBAR DISC SURGERY    . PERIPHERAL VASCULAR BALLOON ANGIOPLASTY Right 10/17/2016   Procedure: Peripheral Vascular Balloon Angioplasty;  Surgeon: Conrad Coalmont, MD;  Location: [facility];  Service: Cardiovascular;  Laterality: Right;  ARM VEINOUS AND CENTRAL VEIN  . PERIPHERAL VASCULAR INTERVENTION Left 02/04/2019   Procedure: PERIPHERAL VASCULAR INTERVENTION;  Surgeon: Lorretta Harp, MD;   Location: [facility];  Service: Cardiovascular;  Laterality: Left;  common iliac  . PR POLYSOM 6/>YRS SLEEP 4/> ADDL PARAM ATTND  12/07/2015  . PROSTATE SURGERY    . TOTAL KNEE ARTHROPLASTY  08/01/2011   Procedure: TOTAL KNEE ARTHROPLASTY;  Surgeon: Johnn Hai;  Location: [facility];  Service: Orthopedics;  Laterality: Right;    Family History  Problem Relation Age of Onset  . Heart attack Father   . Heart disease Father   . Colon polyps Father   . Lung cancer Mother   . Diabetes Sister        x 2  . Heart disease Brother        x 2  . Bone cancer Brother   . Lung disease Neg Hx     Social History:  reports that he quit smoking about 61 years ago. His smoking use included cigarettes, pipe, and cigars. He started smoking about 69 years ago. He has a 10.00 pack-year smoking history. He has never used smokeless tobacco. He reports that he does not drink alcohol or use drugs.  Allergies:  Allergies  Allergen Reactions  . Codeine Other (See Comments)    anxiety  . Dacarbazine Other (See Comments)  . Hydrocodone Other (See Comments)    Anxiety- can take in liquid form  . Pseudoephedrine Other (See Comments)    Causes heart to race  . Azithromycin Rash    Medications: I have reviewed the patient's current medications.  Results for orders placed or performed during the hospital encounter of 09/01/2019 (from the past 48 hour(s))  CBC with Differential     Status: Abnormal   Collection Time: 08/24/2019  8:45 PM  Result Value Ref Range   WBC 4.2 4.0 - 10.5 K/uL   RBC 3.24 (L) 4.22 - 5.81 MIL/uL   Hemoglobin 10.0 (L) 13.0 - 17.0 g/dL   HCT 34.1 (L) 39.0 - 52.0 %   MCV 105.2 (H) 80.0 - 100.0 fL   MCH 30.9 26.0 - 34.0 pg   MCHC 29.3 (L) 30.0 - 36.0 g/dL   RDW 19.7 (H) 11.5 - 15.5 %   Platelets 61 (L) 150 - 400 K/uL    Comment: REPEATED TO VERIFY PLATELET COUNT CONFIRMED BY SMEAR SPECIMEN CHECKED FOR CLOTS Immature Platelet Fraction may be clinically indicated,  consider ordering this additional test [id number]    nRBC 0.0 0.0 - 0.2 %   Neutrophils Relative % 61 %   Neutro Abs 2.6 1.7 - 7.7 K/uL   Lymphocytes Relative 22 %   Lymphs Abs 0.9 0.7 - 4.0 K/uL   Monocytes Relative 15 %   Monocytes Absolute 0.7 0.1 - 1.0 K/uL   Eosinophils Relative 1 %   Eosinophils Absolute 0.0 0.0 - 0.5 K/uL  Basophils Relative 0 %   Basophils Absolute 0.0 0.0 - 0.1 K/uL   Immature Granulocytes 1 %   Abs Immature Granulocytes 0.02 0.00 - 0.07 K/uL   Polychromasia PRESENT    Ovalocytes PRESENT     Comment: Performed at [facility] [address] [place], [place] [address]  Basic metabolic panel     Status: Abnormal   Collection Time: 08/07/2019  8:45 PM  Result Value Ref Range   Sodium 135 135 - 145 mmol/L   Potassium 4.2 3.5 - 5.1 mmol/L   Chloride 93 (L) 98 - 111 mmol/L   CO2 24 22 - 32 mmol/L   Glucose, Bld 139 (H) 70 - 99 mg/dL   BUN 41 (H) 8 - 23 mg/dL   Creatinine, Ser 5.93 (H) 0.61 - 1.24 mg/dL   Calcium 9.0 8.9 - 10.3 mg/dL   GFR calc non Af Amer 8 (L) >60 mL/min   GFR calc Af Amer 9 (L) >60 mL/min   Anion gap 18 (H) 5 - 15    Comment: Performed at [facility] [address] [place], [place] [address]  Troponin I (High Sensitivity)     Status: Abnormal   Collection Time: 08/18/2019  8:45 PM  Result Value Ref Range   Troponin I (High Sensitivity) 75 (H) <18 ng/L    Comment: (NOTE) Elevated high sensitivity troponin I (hsTnI) values and significant  changes across serial measurements may suggest ACS but many other  chronic and acute conditions are known to elevate hsTnI results.  Refer to the "Links" section for chest pain algorithms and additional  guidance. Performed at [facility], [place] [address], [place], [place] [address]   Lactic acid, plasma     Status: Abnormal   Collection Time: 08/27/2019  8:45 PM  Result Value Ref Range   Lactic Acid, Venous 3.8 (HH) 0.5 - 1.9 mmol/L    Comment: CRITICAL RESULT CALLED TO, READ  BACK BY AND VERIFIED WITH: Timmothy Euler [facility] 08/26/2019 2159 WAYK Performed at [facility], [place] [address], [place], [place] [address]   Triglycerides     Status: None   Collection Time: 08/07/2019  8:45 PM  Result Value Ref Range   Triglycerides 131 <150 mg/dL    Comment: Performed at [facility] [address], [place], [place] [address]  D-dimer, quantitative (not at [facility])     Status: Abnormal   Collection Time: 09/01/2019  8:45 PM  Result Value Ref Range   D-Dimer, Quant 7.37 (H) 0.00 - 0.50 ug/mL-FEU    Comment: (NOTE) At the manufacturer cut-off of 0.50 ug/mL FEU, this assay has been documented to exclude PE with a sensitivity and negative predictive value of 97 to 99%.  At this time, this assay has not been approved by the FDA to exclude DVT/VTE. Results should be correlated with clinical presentation. Performed at [facility], [place] [address], [place], [place] [address]   Fibrinogen     Status: Abnormal   Collection Time: 08/27/2019  8:45 PM  Result Value Ref Range   Fibrinogen 490 (H) 210 - 475 mg/dL    Comment: Performed at [facility] [address], [place], [place] [address]  Hepatic function panel     Status: Abnormal   Collection Time: 08/18/2019  8:45 PM  Result Value Ref Range   Total Protein 7.2 6.5 - 8.1 g/dL   Albumin 3.1 (L) 3.5 - 5.0 g/dL  AST 54 (H) 15 - 41 U/L   ALT 30 0 - 44 U/L   Alkaline Phosphatase 110 38 - 126 U/L   Total Bilirubin 2.0 (H) 0.3 - 1.2 mg/dL   Bilirubin, Direct 1.0 (H) 0.0 - 0.2 mg/dL   Indirect Bilirubin 1.0 (H) 0.3 - 0.9 mg/dL    Comment: Performed at [facility] [address] [place], [place] [address]  Lactate dehydrogenase     Status: Abnormal   Collection Time: 08/21/2019  8:45 PM  Result Value Ref Range   LDH 431 (H) 98 - 192 U/L    Comment: Performed at [facility] [address], [place], [place] [address]  Procalcitonin     Status: None   Collection Time: 09/05/2019  8:45 PM  Result Value  Ref Range   Procalcitonin 0.44 ng/mL    Comment:        Interpretation: PCT (Procalcitonin) <= 0.5 ng/mL: Systemic infection (sepsis) is not likely. Local bacterial infection is possible. (NOTE)       Sepsis PCT Algorithm           Lower Respiratory Tract                                      Infection PCT Algorithm    ----------------------------     ----------------------------         PCT < 0.25 ng/mL                PCT < 0.10 ng/mL         Strongly encourage             Strongly discourage   discontinuation of antibiotics    initiation of antibiotics    ----------------------------     -----------------------------       PCT 0.25 - 0.50 ng/mL            PCT 0.10 - 0.25 ng/mL               OR       >80% decrease in PCT            Discourage initiation of                                            antibiotics      Encourage discontinuation           of antibiotics    ----------------------------     -----------------------------         PCT >= 0.50 ng/mL              PCT 0.26 - 0.50 ng/mL               AND        <80% decrease in PCT             Encourage initiation of                                             antibiotics       Encourage continuation           of antibiotics    ----------------------------     -----------------------------  PCT >= 0.50 ng/mL                  PCT > 0.50 ng/mL               AND         increase in PCT                  Strongly encourage                                      initiation of antibiotics    Strongly encourage escalation           of antibiotics                                     -----------------------------                                           PCT <= 0.25 ng/mL                                                 OR                                        > 80% decrease in PCT                                     Discontinue / Do not initiate                                             antibiotics Performed at [facility], [address] [place], [place] [address]   Blood Culture (routine x 2)     Status: None (Preliminary result)   Collection Time: 09/02/2019  8:50 PM   Specimen: BLOOD LEFT FOREARM  Result Value Ref Range   Specimen Description BLOOD LEFT FOREARM    Special Requests      BOTTLES DRAWN AEROBIC AND ANAEROBIC Blood Culture adequate volume   Culture      NO GROWTH 2 DAYS Performed at [facility], [place] [address], [place], [place] [address]    Report Status PENDING   I-stat chem 8, ED (not at [facility] or [facility])     Status: Abnormal   Collection Time: 08/10/2019  9:01 PM  Result Value Ref Range   Sodium 141 135 - 145 mmol/L   Potassium 4.2 3.5 - 5.1 mmol/L   Chloride 97 (L) 98 - 111 mmol/L   BUN 43 (H) 8 - 23 mg/dL   Creatinine, Ser 6.00 (H) 0.61 - 1.24 mg/dL   Glucose, Bld 135 (H) 70 - 99 mg/dL   Calcium, Ion 1.00 (L) 1.15 - 1.40 mmol/L   TCO2 31 22 - 32 mmol/L   Hemoglobin  12.2 (L) 13.0 - 17.0 g/dL   HCT 36.0 (L) 39.0 - 52.0 %  POC SARS Coronavirus 2 Ag-ED - Nasal Swab (BD Veritor Kit)     Status: None   Collection Time: 08/30/2019  9:18 PM  Result Value Ref Range   SARS Coronavirus 2 Ag NEGATIVE NEGATIVE    Comment: (NOTE) SARS-CoV-2 antigen NOT DETECTED.  Negative results are presumptive.  Negative results do not preclude SARS-CoV-2 infection and should not be used as the sole basis for treatment or other patient management decisions, including infection  control decisions, particularly in the presence of clinical signs and  symptoms consistent with COVID-19, or in those who have been in contact with the virus.  Negative results must be combined with clinical observations, patient history, and epidemiological information. The expected result is Negative. Fact Sheet for Patients: PodPark.tn Fact Sheet for Healthcare Providers: GiftContent.is This test is not yet approved or cleared by the [place] FDA and   has been authorized for detection and/or diagnosis of SARS-CoV-2 by FDA under an Emergency Use Authorization (EUA).  This EUA will remain in effect (meaning this test can be used) for the duration of  the COVID-19 de claration under Section 564(b)(1) of the Act, 21 U.S.C. section 360bbb-3(b)(1), unless the authorization is terminated or revoked sooner.   Respiratory Panel by RT PCR (Flu A&B, Covid) - Nasopharyngeal Swab     Status: None   Collection Time: 08/26/2019 10:11 PM   Specimen: Nasopharyngeal Swab  Result Value Ref Range   SARS Coronavirus 2 by RT PCR NEGATIVE NEGATIVE    Comment: (NOTE) SARS-CoV-2 target nucleic acids are NOT DETECTED. The SARS-CoV-2 RNA is generally detectable in upper respiratoy specimens during the acute phase of infection. The lowest concentration of SARS-CoV-2 viral copies this assay can detect is 131 copies/mL. A negative result does not preclude SARS-Cov-2 infection and should not be used as the sole basis for treatment or other patient management decisions. A negative result may occur with  improper specimen collection/handling, submission of specimen other than nasopharyngeal swab, presence of viral mutation(s) within the areas targeted by this assay, and inadequate number of viral copies (<131 copies/mL). A negative result must be combined with clinical observations, patient history, and epidemiological information. The expected result is Negative. Fact Sheet for Patients:  PinkCheek.be Fact Sheet for Healthcare Providers:  GravelBags.it This test is not yet ap proved or cleared by the [place] FDA and  has been authorized for detection and/or diagnosis of SARS-CoV-2 by FDA under an Emergency Use Authorization (EUA). This EUA will remain  in effect (meaning this test can be used) for the duration of the COVID-19 declaration under Section 564(b)(1) of the Act, 21 U.S.C. section  360bbb-3(b)(1), unless the authorization is terminated or revoked sooner.    Influenza A by PCR NEGATIVE NEGATIVE   Influenza B by PCR NEGATIVE NEGATIVE    Comment: (NOTE) The Xpert Xpress SARS-CoV-2/FLU/RSV assay is intended as an aid in  the diagnosis of influenza from Nasopharyngeal swab specimens and  should not be used as a sole basis for treatment. Nasal washings and  aspirates are unacceptable for Xpert Xpress SARS-CoV-2/FLU/RSV  testing. Fact Sheet for Patients: PinkCheek.be Fact Sheet for Healthcare Providers: GravelBags.it This test is not yet approved or cleared by the [place] FDA and  has been authorized for detection and/or diagnosis of SARS-CoV-2 by  FDA under an Emergency Use Authorization (EUA). This EUA will remain  in effect (meaning this test can be used)  for the duration of the  Covid-19 declaration under Section 564(b)(1) of the Act, 21  U.S.C. section 360bbb-3(b)(1), unless the authorization is  terminated or revoked. Performed at [facility], [place] [address] [place], [place] [address]   Lactic acid, plasma     Status: None   Collection Time: 08/09/19  1:20 AM  Result Value Ref Range   Lactic Acid, Venous 1.7 0.5 - 1.9 mmol/L    Comment: Performed at [facility] [address] [place], [place] [address]  Blood Culture (routine x 2)     Status: None (Preliminary result)   Collection Time: 08/09/19  1:20 AM   Specimen: BLOOD RIGHT HAND  Result Value Ref Range   Specimen Description BLOOD RIGHT HAND    Special Requests      BOTTLES DRAWN AEROBIC AND ANAEROBIC Blood Culture adequate volume   Culture      NO GROWTH 1 DAY Performed at [facility], [place] [address] [place], [place] [address]    Report Status PENDING   Ferritin     Status: Abnormal   Collection Time: 08/09/19  1:20 AM  Result Value Ref Range   Ferritin 2,220 (H) 24 - 336 ng/mL    Comment: Performed at [facility], [place] [address], [place], [place] [address]  C-reactive protein     Status: Abnormal   Collection Time: 08/09/19  1:20 AM  Result Value Ref Range   CRP 6.3 (H) <1.0 mg/dL    Comment: Performed at [facility] [address] [place], [place] [address]  Troponin I (High Sensitivity)     Status: Abnormal   Collection Time: 08/09/19  1:20 AM  Result Value Ref Range   Troponin I (High Sensitivity) 133 (HH) <18 ng/L    Comment: CRITICAL RESULT CALLED TO, READ BACK BY AND VERIFIED WITH: DENNIS A,RN 08/09/19 0246 WAYK Performed at [facility], [place] [address] [place], [place] [address]   CBC     Status: Abnormal   Collection Time: 08/09/19  1:20 AM  Result Value Ref Range   WBC 3.1 (L) 4.0 - 10.5 K/uL   RBC 2.88 (L) 4.22 - 5.81 MIL/uL   Hemoglobin 9.1 (L) 13.0 - 17.0 g/dL    Comment: REPEATED TO VERIFY   HCT 29.4 (L) 39.0 - 52.0 %   MCV 102.1 (H) 80.0 - 100.0 fL   MCH 31.6 26.0 - 34.0 pg   MCHC 31.0 30.0 - 36.0 g/dL   RDW 19.4 (H) 11.5 - 15.5 %   Platelets 48 (L) 150 - 400 K/uL    Comment: Immature Platelet Fraction may be clinically indicated, consider ordering this additional test [id number] CONSISTENT WITH PREVIOUS RESULT    nRBC 0.0 0.0 - 0.2 %    Comment: Performed at [facility], [place] [address] [place], [place] [address]  Creatinine, serum     Status: Abnormal   Collection Time: 08/09/19  1:20 AM  Result Value Ref Range   Creatinine, Ser 6.30 (H) 0.61 - 1.24 mg/dL   GFR calc non Af Amer 7 (L) >60 mL/min   GFR calc Af Amer 9 (L) >60 mL/min    Comment: Performed at [facility] [address], [place],  [address]  Hemoglobin A1c     Status: None   Collection Time: 08/09/19  1:20 AM  Result Value Ref Range   Hgb A1c MFr Bld 4.8 4.8 - 5.6 %  Comment: (NOTE) Pre diabetes:          5.7%-6.4% Diabetes:              >6.4% Glycemic control for   <7.0% adults with diabetes    Mean Plasma Glucose 91.06 mg/dL    Comment:  Performed at [facility] [address], [place], [place] [address]  CBG monitoring, ED     Status: Abnormal   Collection Time: 08/09/19  1:28 AM  Result Value Ref Range   Glucose-Capillary 113 (H) 70 - 99 mg/dL  Basic metabolic panel     Status: Abnormal   Collection Time: 08/09/19  5:09 AM  Result Value Ref Range   Sodium 139 135 - 145 mmol/L   Potassium 4.6 3.5 - 5.1 mmol/L   Chloride 94 (L) 98 - 111 mmol/L   CO2 24 22 - 32 mmol/L   Glucose, Bld 154 (H) 70 - 99 mg/dL   BUN 48 (H) 8 - 23 mg/dL   Creatinine, Ser 8.14 (H) 0.61 - 1.24 mg/dL   Calcium 9.1 8.9 - 10.3 mg/dL   GFR calc non Af Amer 5 (L) >60 mL/min   GFR calc Af Amer 6 (L) >60 mL/min   Anion gap 21 (H) 5 - 15    Comment: Performed at [facility] [address], [place], [place] [address]  CBC WITH DIFFERENTIAL     Status: Abnormal   Collection Time: 08/09/19  5:09 AM  Result Value Ref Range   WBC 3.2 (L) 4.0 - 10.5 K/uL   RBC 2.81 (L) 4.22 - 5.81 MIL/uL   Hemoglobin 8.8 (L) 13.0 - 17.0 g/dL   HCT 28.9 (L) 39.0 - 52.0 %   MCV 102.8 (H) 80.0 - 100.0 fL   MCH 31.3 26.0 - 34.0 pg   MCHC 30.4 30.0 - 36.0 g/dL   RDW 19.2 (H) 11.5 - 15.5 %   Platelets 46 (L) 150 - 400 K/uL    Comment: Immature Platelet Fraction may be clinically indicated, consider ordering this additional test [id number] CONSISTENT WITH PREVIOUS RESULT    nRBC 0.0 0.0 - 0.2 %   Neutrophils Relative % 85 %   Neutro Abs 2.7 1.7 - 7.7 K/uL   Lymphocytes Relative 8 %   Lymphs Abs 0.2 (L) 0.7 - 4.0 K/uL   Monocytes Relative 6 %   Monocytes Absolute 0.2 0.1 - 1.0 K/uL   Eosinophils Relative 0 %   Eosinophils Absolute 0.0 0.0 - 0.5 K/uL   Basophils Relative 0 %   Basophils Absolute 0.0 0.0 - 0.1 K/uL   Immature Granulocytes 1 %   Abs Immature Granulocytes 0.04 0.00 - 0.07 K/uL    Comment: Performed at [facility], [place] [address] [place],  [address]  Hepatitis B surface antigen     Status: None   Collection Time: 08/09/19   7:35 AM  Result Value Ref Range   Hepatitis B Surface Ag NON REACTIVE NON REACTIVE    Comment: Performed at [facility] [address] [place], [place] [address]  Glucose, capillary     Status: None   Collection Time: 08/09/19 11:36 AM  Result Value Ref Range   Glucose-Capillary 83 70 - 99 mg/dL  Glucose, capillary     Status: Abnormal   Collection Time: 08/09/19  4:03 PM  Result Value Ref Range   Glucose-Capillary 167 (H) 70 - 99 mg/dL  Glucose, capillary     Status: Abnormal  Collection Time: 08/09/19  9:05 PM  Result Value Ref Range   Glucose-Capillary 116 (H) 70 - 99 mg/dL   Comment 1 Notify RN    Comment 2 Document in Chart   Lactic acid, plasma     Status: Abnormal   Collection Time: 08/09/19  9:53 PM  Result Value Ref Range   Lactic Acid, Venous 2.9 (HH) 0.5 - 1.9 mmol/L    Comment: CRITICAL RESULT CALLED TO, READ BACK BY AND VERIFIED WITH: Tana Coast 08/09/19 2253 WAYK Performed at [facility], [place] [address] [place], [place] [address]   Lactic acid, plasma     Status: Abnormal   Collection Time: 08/10/19 12:31 AM  Result Value Ref Range   Lactic Acid, Venous 2.0 (HH) 0.5 - 1.9 mmol/L    Comment: CRITICAL VALUE NOTED.  VALUE IS CONSISTENT WITH PREVIOUSLY REPORTED AND CALLED VALUE. Performed at [facility], [place] [address], [place], [place] [address]   CBC     Status: Abnormal   Collection Time: 08/10/19 12:31 AM  Result Value Ref Range   WBC 4.5 4.0 - 10.5 K/uL   RBC 2.84 (L) 4.22 - 5.81 MIL/uL   Hemoglobin 9.0 (L) 13.0 - 17.0 g/dL   HCT 29.1 (L) 39.0 - 52.0 %   MCV 102.5 (H) 80.0 - 100.0 fL   MCH 31.7 26.0 - 34.0 pg   MCHC 30.9 30.0 - 36.0 g/dL   RDW 19.1 (H) 11.5 - 15.5 %   Platelets 46 (L) 150 - 400 K/uL    Comment: Immature Platelet Fraction may be clinically indicated, consider ordering this additional test [id number] CONSISTENT WITH PREVIOUS RESULT    nRBC 0.0 0.0 - 0.2 %    Comment: Performed at [facility], [place]  [address], [place], [place] [address]  Glucose, capillary     Status: Abnormal   Collection Time: 08/10/19  6:13 AM  Result Value Ref Range   Glucose-Capillary 50 (L) 70 - 99 mg/dL   Comment 1 Notify RN    Comment 2 Document in Chart   Glucose, capillary     Status: Abnormal   Collection Time: 08/10/19  6:42 AM  Result Value Ref Range   Glucose-Capillary 49 (L) 70 - 99 mg/dL   Comment 1 Notify RN    Comment 2 Document in Chart   Glucose, capillary     Status: Abnormal   Collection Time: 08/10/19  7:21 AM  Result Value Ref Range   Glucose-Capillary 136 (H) 70 - 99 mg/dL   Comment 1 Notify RN    Comment 2 Document in Chart   CBC     Status: Abnormal   Collection Time: 08/10/19  8:07 AM  Result Value Ref Range   WBC 4.9 4.0 - 10.5 K/uL   RBC 2.88 (L) 4.22 - 5.81 MIL/uL   Hemoglobin 8.9 (L) 13.0 - 17.0 g/dL   HCT 29.0 (L) 39.0 - 52.0 %   MCV 100.7 (H) 80.0 - 100.0 fL   MCH 30.9 26.0 - 34.0 pg   MCHC 30.7 30.0 - 36.0 g/dL   RDW 18.8 (H) 11.5 - 15.5 %   Platelets 44 (L) 150 - 400 K/uL    Comment: REPEATED TO VERIFY Immature Platelet Fraction may be clinically indicated, consider ordering this additional test [id number] CONSISTENT WITH PREVIOUS RESULT    nRBC 0.0 0.0 - 0.2 %    Comment: Performed at [facility], [address] [place], [place] [address]  Glucose, capillary     Status: None   Collection Time: 08/10/19 11:38 AM  Result Value Ref Range   Glucose-Capillary 80 70 - 99 mg/dL   *Note: Due to a large number of results and/or encounters for the requested time period, some results have not been displayed. A complete set of results can be found in Results Review.    DG Chest 1 View  Result Date: 08/09/2019 CLINICAL DATA:  Fever. Shortness of breath. EXAM: CHEST  1 VIEW COMPARISON:  August 08, 2019 FINDINGS: The heart size remains enlarged. The patient is status post prior median sternotomy. Multi lead left-sided pacemaker is noted. There is no pneumothorax. There is a  persistent opacity at the right lung base which may represent a combination of a right-sided pleural effusion and atelectasis or infiltrate. There appears to be a small left-sided pleural effusion with adjacent atelectasis. IMPRESSION: 1. Low lung volumes with a persistent right basilar opacity which may represent a combination of atelectasis and a small right-sided pleural effusion. An infiltrate is difficult to entirely exclude. 2. Cardiomegaly. 3. Otherwise, stable appearance of the chest. Electronically Signed   By: Constance Holster M.D.   On: 08/09/2019 21:19   DG Chest Portable 1 View  Result Date: 08/27/2019 CLINICAL DATA:  Shortness of breath. EXAM: PORTABLE CHEST 1 VIEW COMPARISON:  02/07/2019 FINDINGS: Previous median sternotomy, CABG and pacemaker/AICD placement. Poor inspiration. Interstitial edema. Basilar volume loss. Findings most consistent with heart failure. IMPRESSION: Probable congestive heart failure. Cardiomegaly. Venous hypertension and interstitial edema. Poor inspiration with basilar volume loss. Electronically Signed   By: Nelson Chimes M.D.   On: 09/01/2019 21:31   ECHOCARDIOGRAM COMPLETE  Result Date: 08/09/2019   ECHOCARDIOGRAM REPORT   Patient Name:   Shane [name] Date of Exam: 08/09/2019 Medical Rec #:  [id number]        Height:       69.0 in Accession #:    [id number]       Weight:       200.6 lb Date of Birth:  1934-02-22         BSA:          2.07 m Patient Age:    62 years         BP:           103/58 mmHg Patient Gender: M                HR:           88 bpm. Exam Location:  Inpatient Procedure: 2D Echo Indications:    CHF 428  History:        Patient has prior history of Echocardiogram examinations, most                 recent 01/01/2018. CHF, CAD; Risk Factors:Hypertension, Diabetes                 and Dyslipidemia. AICD.  Sonographer:    Jannett Celestine RDCS (AE) Referring Phys: [place]  Sonographer Comments: limited mobility (left shoulder issue)...supine  exam. off axis parasternal window IMPRESSIONS  1. Left ventricular ejection fraction, by visual estimation, is 40 to 45%. The left ventricle has mildly decreased function. There is no left ventricular hypertrophy.  2. The left ventricle has no regional wall motion abnormalities.  3. Global right ventricle has severely reduced systolic function.The right ventricular size is normal. No increase in right ventricular wall thickness.  4. Left atrial size was  mildly dilated.  5. Right atrial size was mildly dilated.  6. The mitral valve is grossly normal. Mild mitral valve regurgitation.  7. The tricuspid valve is grossly normal.  8. The aortic valve is tricuspid. Aortic valve regurgitation is not visualized. Mild aortic valve sclerosis without stenosis.  9. The pulmonic valve was normal in structure. Pulmonic valve regurgitation is not visualized. 10. Mildly elevated pulmonary artery systolic pressure. 11. The atrial septum is grossly normal. FINDINGS  Left Ventricle: Left ventricular ejection fraction, by visual estimation, is 40 to 45%. The left ventricle has mildly decreased function. The left ventricle has no regional wall motion abnormalities. There is no left ventricular hypertrophy. Left ventricular diastolic parameters were normal. Right Ventricle: The right ventricular size is normal. No increase in right ventricular wall thickness. Global RV systolic function is has severely reduced systolic function. The tricuspid regurgitant velocity is 2.37 m/s, and with an assumed right atrial pressure of 10 mmHg, the estimated right ventricular systolic pressure is mildly elevated at 32.5 mmHg. Left Atrium: Left atrial size was mildly dilated. Right Atrium: Right atrial size was mildly dilated Pericardium: There is no evidence of pericardial effusion. Mitral Valve: The mitral valve is grossly normal. Mild mitral valve regurgitation. Tricuspid Valve: The tricuspid valve is grossly normal. Tricuspid valve regurgitation  moderate. Aortic Valve: The aortic valve is tricuspid. . There is moderate thickening and moderate calcification of the aortic valve. Aortic valve regurgitation is not visualized. Mild aortic valve sclerosis is present, with no evidence of aortic valve stenosis. Moderate aortic valve annular calcification. There is moderate thickening of the aortic valve. There is moderate calcification of the aortic valve. Aortic valve mean gradient measures 11.0 mmHg. Aortic valve peak gradient measures 17.3 mmHg. Aortic valve  area, by VTI measures 2.46 cm. Pulmonic Valve: The pulmonic valve was normal in structure. Pulmonic valve regurgitation is not visualized. Pulmonic regurgitation is not visualized. Aorta: The aortic root and ascending aorta are structurally normal, with no evidence of dilitation. IAS/Shunts: The atrial septum is grossly normal.  LEFT VENTRICLE PLAX 2D LVIDd:         5.12 cm  Diastology LVIDs:         3.78 cm  LV e' lateral:   12.50 cm/s LV PW:         1.37 cm  LV E/e' lateral: 8.2 LV IVS:        0.96 cm  LV e' medial:    8.05 cm/s LVOT diam:     2.50 cm  LV E/e' medial:  12.7 LV SV:         64 ml LV SV Index:   29.96 LVOT Area:     4.91 cm  RIGHT VENTRICLE TAPSE (M-mode): 0.9 cm LEFT ATRIUM             Index       RIGHT ATRIUM           Index LA diam:        5.20 cm 2.51 cm/m  RA Area:     23.40 cm LA Vol (A2C):   77.6 ml 37.51 ml/m RA Volume:   68.40 ml  33.06 ml/m LA Vol (A4C):   66.1 ml 31.95 ml/m LA Biplane Vol: 73.1 ml 35.34 ml/m  AORTIC VALVE AV Area (Vmax):    2.48 cm AV Area (Vmean):   2.39 cm AV Area (VTI):     2.46 cm AV Vmax:           208.00 cm/s  AV Vmean:          157.000 cm/s AV VTI:            0.390 m AV Peak Grad:      17.3 mmHg AV Mean Grad:      11.0 mmHg LVOT Vmax:         105.00 cm/s LVOT Vmean:        76.400 cm/s LVOT VTI:          0.196 m LVOT/AV VTI ratio: 0.50  AORTA Ao Root diam: 3.20 cm MITRAL VALVE                         TRICUSPID VALVE MV Area (PHT): 4.49 cm               TR Peak grad:   22.5 mmHg MV PHT:        49.01 msec            TR Vmax:        266.00 cm/s MV Decel Time: 169 msec MR Peak grad:    72.9 mmHg           SHUNTS MR Mean grad:    42.0 mmHg           Systemic VTI:  0.20 m MR Vmax:         427.00 cm/s         Systemic Diam: 2.50 cm MR Vmean:        301.0 cm/s MR PISA:         2.26 cm MR PISA Eff ROA: 15 mm MR PISA Radius:  0.60 cm MV E velocity: 102.00 cm/s 103 cm/s MV A velocity: 64.30 cm/s  70.3 cm/s MV E/A ratio:  1.59        1.5  Mertie Moores MD Electronically signed by Mertie Moores MD Signature Date/Time: 08/09/2019/4:47:06 PM    Final     Review of Systems  HENT: Negative for ear discharge, ear pain, hearing loss and tinnitus.   Eyes: Negative for photophobia and pain.  Respiratory: Positive for shortness of breath. Negative for cough.   Cardiovascular: Negative for chest pain.  Gastrointestinal: Negative for abdominal pain, nausea and vomiting.  Genitourinary: Negative for dysuria, flank pain, frequency and urgency.  Musculoskeletal: Positive for arthralgias (Left knee) and joint swelling (Left knee). Negative for back pain, myalgias and neck pain.  Neurological: Negative for dizziness and headaches.  Hematological: Does not bruise/bleed easily.  Psychiatric/Behavioral: The patient is not nervous/anxious.    Blood pressure 104/69, pulse 99, temperature 98.2 F (36.8 C), temperature source Oral, resp. rate 20, weight 94.8 kg, SpO2 98 %. Physical Exam  Constitutional: He appears well-developed and well-nourished. No distress.  HENT:  Head: Normocephalic and atraumatic.  Eyes: Conjunctivae are normal. Right eye exhibits no discharge. Left eye exhibits no discharge. No scleral icterus.  Cardiovascular: Normal rate and regular rhythm.  Respiratory: Effort normal. No respiratory distress.  Musculoskeletal:     Cervical back: Normal range of motion.     Comments: LLE No traumatic wounds, ecchymosis, or rash  Knee pain with PROM >45  degrees, mod effusion  No ankle effusion  Knee stable to varus/ valgus and anterior/posterior stress  Sens DPN, SPN, TN intact  Motor EHL, ext, flex, evers 5/5  DP 2+, PT 0, No significant edema  Neurological: He is alert.  Skin: Skin is warm and dry. He is not diaphoretic.  Psychiatric: He has  a normal mood and affect. His behavior is normal.    Assessment/Plan: Left knee effusion -- Will aspirate and send for analysis. Have low suspicion for septic joint. Pt should f/u with [facility] upon discharge at his regularly scheduled appointment.    Lisette Abu, PA-C Orthopedic Surgery [phone] 08/10/2019, 1:04 PM

## 2019-08-10 NOTE — Consult Note (Addendum)
Cardiology Consultation:   Patient ID: Shane [name] MRN: [id number]; DOB: June 22, 1934  Admit date: 08/21/2019 Date of Consult: 08/10/2019  Primary Care Provider: [name], MD Primary Cardiologist: Mertie Moores, MD Primary Electrophysiologist:  None    Patient Profile:   Shane [name] is a 86 y.o. male with a hx of CAD s/p CABG 1986 with ischemic cardiomyopathy s/p ICD, hypertension, hyperlipidemia, diabetes type 2, ESRD on hemodialysis M-W-F, moderate bilateral RAS, PAD s/p PTA/stenting left common iliac 02/2019, chronic hypoxic respiratory failure on home oxygen 2 L at night, OSA unable to tolerate CPAP who is being seen today for preoperative evaluation at the request of Dr. Algis Liming.  History of Present Illness:   Mr. Main has a history of CAD with remote MI and CABG in about 96.  He has subsequent ischemic cardiomyopathy and EF has been stable at 40-45% over several years.  He was followed for many years by Dr. Mare Ferrari.  He is now followed by Dr. Acie Fredrickson for general cardiology and Dr. Caryl Comes for electrophysiology and pacemaker.  The patient was seen in the past by Dr. Haroldine Laws for advanced heart failure.  The patient's heart failure symptoms were much improved after starting hemodialysis in May 2019.  He was taken off beta-blocker due to low blood pressures with hemodialysis sessions as well as all other heart failure medications.  Mr. Latouche was admitted to the hospital in 08/2017 after an ICD shock.  Amiodarone was started.  He was found to be markedly hypokalemic with potassium of 2.6.  This was replaced and he was able to be discharged.  Mr. Tarnow presented to the hospital on 09/05/2019 due to progressive dyspnea, hypoxia, lower extremity edema despite compliance with his outpatient hemodialysis.  He was admitted for acute on chronic hypoxic respiratory failure due to decompensated CHF.  Nephrology was consulted and the patient had dialysis on 08/09/2019 and 1/5.  His  hospitalization has been complicated by a febrile illness that is suspected to be pneumonia.  He is also been noted to have acute left knee arthritis and patient had a left knee effusion aspirated by orthopedics.  Cardiology has been asked for preop evaluation of this patient for possible planned surgery on his knee.  The patient is noted to have frequent falls, 6-7 times in the past 6 weeks with injuries to his shoulder and toe.  On my evaluation the patient is resting in his room with his wife present at the bedside.  He is continuously coughing with production of tan mucus.  He is unable to answer many of my questions due to the coughing.  He does say that he had been doing fine from a heart standpoint until a couple of weeks ago when his breathing got worse and he had mild swelling.  He was compliant with his dialysis but he is unable to tell me any details.  His wife is unaware of his recent complaints.  The patient is unable to tell if his symptoms have improved since he came into the hospital.   Heart Pathway Score:     Past Medical History:  Diagnosis Date  . Abnormal nuclear cardiac imaging test Nov 2010   moderate area of infarct in the inferior wall with only minimal reversibility and EF of 28%  . AICD (automatic cardioverter/defibrillator) present   . Allergic rhinitis 01-08-13   Uses nebulizer for chronic sinus issues and Mucinex.  . Arthritis    osteoarthritis   . Asthma    Extrinic  .  Blood transfusion    ? at time of bypass surgery   . BPH (benign prostatic hyperplasia)   . Cellulitis of arm, left    MSSA  . CHF (congestive heart failure) ([place])   . Colon polyps    adenomatous  . Coronary artery disease    remote CABG in 1985, cath in 2003 by Dr. Lia Foyer with no PCI, last nuclear in 2010 showing scar and EF of 28%.   . Diabetes mellitus   . Diabetic neuropathy ([place]) 04/25/2017  . Dyslipidemia   . Dyspnea   . ESRD (end stage renal disease) ([place])   . Falls frequently   .  Gait abnormality 04/25/2017  . Glaucoma 01-08-13   tx. eye drops  . Hemodialysis patient ([place])   . HOH (hard of hearing)   . HTN (hypertension)   . Hypercholesterolemia   . Left ventricular dysfunction    28% per nuclear in 2010 and 35 to 40% per echo in 2010  . Myocardial infarction ([place])    1985  . Neuromuscular disorder ([place])    legs mild paralysis-able to walk"nerve damage"-legs- left leg brace   . Neuropathy   . Pneumonia    hx of several times years ago   . PVD (peripheral vascular disease) ([place])    a. s/p PTA/stenting of L CIA 02/2019.  Marland Kitchen Renal artery stenosis ([facility])    a. moderate bilateral by angio 02/2019.  Marland Kitchen Spinal stenosis     Past Surgical History:  Procedure Laterality Date  . A/V FISTULAGRAM Right 10/17/2016   Procedure: A/V Fistulagram;  Surgeon: Conrad Port Barre, MD;  Location: [facility];  Service: Cardiovascular;  Laterality: Right;  . ABDOMINAL AORTOGRAM N/A 02/04/2019   Procedure: ABDOMINAL AORTOGRAM;  Surgeon: Lorretta Harp, MD;  Location: [facility];  Service: Cardiovascular;  Laterality: N/A;  . BACK SURGERY     hx of back surgery x 4   . BASCILIC VEIN TRANSPOSITION Right 09/09/2016   Procedure: BASCILIC VEIN TRANSPOSITION Right Arm;  Surgeon: Rosetta Posner, MD;  Location: [facility] OR;  Service: Vascular;  Laterality: Right;  . BI-VENTRICULAR IMPLANTABLE CARDIOVERTER DEFIBRILLATOR N/A 10/10/2014   Procedure: BI-VENTRICULAR IMPLANTABLE CARDIOVERTER DEFIBRILLATOR  (CRT-D);  Surgeon: [name], MD;  Location: [facility] LAB;  Service: Cardiovascular;  Laterality: N/A;  . CARDIAC CATHETERIZATION  2003  . Cataract      recent cataract surgery 6'14  . COLONOSCOPY N/A 01/25/2013   Procedure: COLONOSCOPY;  Surgeon: Irene Shipper, MD;  Location: [facility];  Service: Endoscopy;  Laterality: N/A;  . CORONARY ARTERY BYPASS GRAFT  1985   x 5 vessels  . LOWER EXTREMITY ANGIOGRAPHY Bilateral 02/04/2019   Procedure: Lower Extremity Angiography;  Surgeon: Lorretta Harp, MD;  Location: [facility];  Service: Cardiovascular;  Laterality: Bilateral;  limited study to iliacs  . LUMBAR DISC SURGERY    . PERIPHERAL VASCULAR BALLOON ANGIOPLASTY Right 10/17/2016   Procedure: Peripheral Vascular Balloon Angioplasty;  Surgeon: Conrad Sutton, MD;  Location: [facility];  Service: Cardiovascular;  Laterality: Right;  ARM VEINOUS AND CENTRAL VEIN  . PERIPHERAL VASCULAR INTERVENTION Left 02/04/2019   Procedure: PERIPHERAL VASCULAR INTERVENTION;  Surgeon: Lorretta Harp, MD;  Location: [facility];  Service: Cardiovascular;  Laterality: Left;  common iliac  . PR POLYSOM 6/>YRS SLEEP 4/> ADDL PARAM ATTND  12/07/2015  . PROSTATE SURGERY    . TOTAL KNEE ARTHROPLASTY  08/01/2011   Procedure: TOTAL KNEE  ARTHROPLASTY;  Surgeon: Johnn Hai;  Location: [facility] ORS;  Service: Orthopedics;  Laterality: Right;     Home Medications:  Prior to Admission medications   Medication Sig Start Date End Date Taking? Authorizing Provider  acetaminophen (TYLENOL) 500 MG tablet Take 1,000 mg by mouth every 6 (six) hours as needed for moderate pain.   Yes [provider]  albuterol (PROVENTIL) (2.5 MG/3ML) 0.083% nebulizer solution Take 3 mLs (2.5 mg total) by nebulization every 6 (six) hours as needed for wheezing or shortness of breath. 12/29/18  Yes Magdalen Spatz, NP  Albuterol Sulfate (PROAIR RESPICLICK) 081 (90 Base) MCG/ACT AEPB Inhale 1-2 puffs into the lungs every 6 (six) hours as needed. Patient taking differently: Inhale 1-2 puffs into the lungs every 6 (six) hours as needed (shortness of breath).  04/08/18  Yes Juanito Doom, MD  allopurinol (ZYLOPRIM) 100 MG tablet TAKE 1 TABLET DAILY Patient taking differently: Take 100 mg by mouth daily.  02/16/19  Yes Burns, Claudina Lick, MD  amiodarone (PACERONE) 200 MG tablet TAKE 1 TABLET DAILY Patient taking differently: Take 200 mg by mouth daily.  05/12/19  Yes [name], MD  aspirin EC 81 MG tablet  Take 81 mg by mouth at bedtime.    Yes [provider]  atorvastatin (LIPITOR) 80 MG tablet Take 1 tablet (80 mg total) by mouth every evening. Please make overdue appt with Dr. Acie Fredrickson before anymore refills. 1st attempt 06/01/19  Yes Burns, Claudina Lick, MD  calcitRIOL (ROCALTROL) 0.25 MCG capsule Take 1 capsule (0.25 mcg total) by mouth every Monday, Wednesday, and Friday with hemodialysis. 12/19/17  Yes Georgette Shell, MD  calcium acetate (PHOSLO) 667 MG capsule Take 1,334 mg by mouth 3 (three) times daily with meals.    Yes [provider]  calcium carbonate (TUMS) 500 MG chewable tablet Chew 1 tablet by mouth 2 (two) times daily as needed for indigestion or heartburn.   Yes [provider]  cetirizine (ZYRTEC) 10 MG tablet Take 10 mg by mouth at bedtime as needed (seasonal allergies).    Yes [provider]  clopidogrel (PLAVIX) 75 MG tablet Take 1 tablet (75 mg total) by mouth daily. 02/05/19  Yes Dunn, Nedra Hai, PA-C  clotrimazole-betamethasone (LOTRISONE) lotion Apply to left foot twice daily 12/15/18  Yes Galaway, Stephani Police, DPM  dicyclomine (BENTYL) 10 MG capsule Take 10 mg by mouth 3 (three) times daily as needed for spasms.  05/13/18  Yes [provider]  famotidine (PEPCID) 40 MG tablet Take 1 tablet (40 mg total) by mouth at bedtime. 06/01/19  Yes Burns, Claudina Lick, MD  fluticasone furoate-vilanterol (BREO ELLIPTA) 200-25 MCG/INH AEPB Inhale 1 puff into the lungs daily. 12/17/18  Yes Lauraine Rinne, NP  glipiZIDE (GLUCOTROL XL) 5 MG 24 hr tablet Take 5 mg by mouth every evening.  06/23/18  Yes [provider]  multivitamin (RENA-VIT) TABS tablet Take 1 tablet by mouth at bedtime. 12/19/17  Yes Georgette Shell, MD  mupirocin ointment (BACTROBAN) 2 % Apply 1 application topically 2 (two) times daily. 08/03/19  Yes Trula Slade, DPM  Omalizumab Arvid Right [place]) Inject into the skin every 21 ( twenty-one) days.   Yes [provider]    OXYGEN Inhale 2 L into the lungs See admin instructions. Inhale 2 L into lungs during dialysis on Monday, Wednesday and Friday and every night at bedtime   Yes [provider]  pantoprazole (PROTONIX) 40 MG tablet Take 1 tablet (  40 mg total) by mouth daily. 07/07/19  Yes Willia Craze, NP  polyethylene glycol (MIRALAX / GLYCOLAX) packet Take 17 g by mouth daily as needed for mild constipation. 12/19/17  Yes Georgette Shell, MD  sodium chloride (OCEAN) 0.65 % SOLN nasal spray Place 1 spray into both nostrils as needed for congestion.   Yes [provider]  testosterone cypionate (DEPOTESTOTERONE CYPIONATE) 100 MG/ML injection Inject 400 mg into the muscle every 21 ( twenty-one) days. For IM use only   Yes [provider]  zolpidem (AMBIEN) 10 MG tablet TAKE 1/2 (ONE-HALF) TABLET BY MOUTH AT BEDTIME .  TAKE  AN  ADDITIONAL  1/2  TABLET  IF  NEEDED  FOR  SLEEP Patient taking differently: Take 10-15 mg by mouth at bedtime. Take an additional 1/2 tab if needed to fall asleep. 05/13/19  Yes Burns, Claudina Lick, MD  Syringe/Needle, Disp, (SYRINGE 3CC/22GX1") 22G X 1" 3 ML MISC Use to give injection 07/09/13   [provider]  tadalafil (CIALIS) 20 MG tablet Take 20 mg by mouth daily as needed for erectile dysfunction. Do not exceed 2 doses in 7 days    [provider]    Inpatient Medications: Scheduled Meds: . allopurinol  100 mg Oral Daily  . amiodarone  200 mg Oral Daily  . aspirin EC  81 mg Oral QHS  . atorvastatin  80 mg Oral QPM  . bupivacaine  10 mL Infiltration Once  . [START ON 08/11/2019] calcitRIOL  0.25 mcg Oral Q M,W,F-HD  . calcium acetate  1,334 mg Oral TID WC  . Chlorhexidine Gluconate Cloth  6 each Topical Q0600  . clopidogrel  75 mg Oral Daily  . fluticasone furoate-vilanterol  1 puff Inhalation Daily  . methylPREDNISolone acetate  80 mg Intra-articular Once  . multivitamin  1 tablet Oral QHS  . mupirocin ointment  1 application Topical  BID  . pantoprazole  40 mg Oral Daily  . sodium chloride flush  3 mL Intravenous Q12H   Continuous Infusions: . sodium chloride    . sodium chloride    . sodium chloride    . ceFEPime (MAXIPIME) IV 1 g (08/10/19 1456)  . [START ON 08/11/2019] vancomycin     PRN Meds: sodium chloride, sodium chloride, sodium chloride, acetaminophen, albuterol, alteplase, calcium carbonate, fentaNYL (SUBLIMAZE) injection, heparin, lidocaine (PF), lidocaine-prilocaine, ondansetron (ZOFRAN) IV, pentafluoroprop-tetrafluoroeth, polyethylene glycol, sodium chloride flush  Allergies:    Allergies  Allergen Reactions  . Codeine Other (See Comments)    anxiety  . Dacarbazine Other (See Comments)  . Hydrocodone Other (See Comments)    Anxiety- can take in liquid form  . Pseudoephedrine Other (See Comments)    Causes heart to race  . Azithromycin Rash    Social History:   Social History   Socioeconomic History  . Marital status: Married    Spouse name: Not on file  . Number of children: 2  . Years of education: Not on file  . Highest education level: Not on file  Occupational History  . Occupation: pastor  Tobacco Use  . Smoking status: Former Smoker    Packs/day: 1.00    Years: 10.00    Pack years: 10.00    Types: Cigarettes, Pipe, Cigars    Start date: 12/02/1949    Quit date: 08/05/1958    Years since quitting: 61.0  . Smokeless tobacco: Never Used  Substance and Sexual Activity  . Alcohol use: No  . Drug use: No  .  Sexual activity: Not on file  Other Topics Concern  . Not on file  Social History Narrative   Originally from [place]. He has always lived in [place]. Prior travel to [place], [place], [place], [place] ,[place], & [place]. 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 Has adopted children. Has a dog currently. No bird exposure. No mold exposure. Enjoys reading & traveling.   Social Determinants of Health   Financial Resource Strain:   . Difficulty of Paying Living Expenses:  Not on file  Food Insecurity:   . Worried About Charity fundraiser in the Last Year: Not on file  . Ran Out of Food in the Last Year: Not on file  Transportation Needs:   . Lack of Transportation (Medical): Not on file  . Lack of Transportation (Non-Medical): Not on file  Physical Activity:   . Days of Exercise per Week: Not on file  . Minutes of Exercise per Session: Not on file  Stress:   . Feeling of Stress : Not on file  Social Connections:   . Frequency of Communication with Friends and Family: Not on file  . Frequency of Social Gatherings with Friends and Family: Not on file  . Attends Religious Services: Not on file  . Active Member of Clubs or Organizations: Not on file  . Attends Archivist Meetings: Not on file  . Marital Status: Not on file  Intimate Partner Violence:   . Fear of Current or Ex-Partner: Not on file  . Emotionally Abused: Not on file  . Physically Abused: Not on file  . Sexually Abused: Not on file    Family History:    Family History  Problem Relation Age of Onset  . Heart attack Father   . Heart disease Father   . Colon polyps Father   . Lung cancer Mother   . Diabetes Sister        x 2  . Heart disease Brother        x 2  . Bone cancer Brother   . Lung disease Neg Hx      ROS:  Please see the history of present illness.   All other ROS reviewed and negative.     Physical Exam/Data:   Vitals:   08/10/19 1300 08/10/19 1330 08/10/19 1357 08/10/19 1505  BP: (!) 99/53 (!) 91/53 (!) 91/49 (!) 101/48  Pulse: (!) 118 (!) 116 (!) 113 (!) 108  Resp: (!) 26  (!) 21 20  Temp:   99.6 F (37.6 C)   TempSrc:   Oral   SpO2:   97% 100%  Weight:   104 kg     Intake/Output Summary (Last 24 hours) at 08/10/2019 1601 Last data filed at 08/10/2019 1357 Gross per 24 hour  Intake 1240 ml  Output 2500 ml  Net -1260 ml   Last 3 Weights 08/10/2019 08/10/2019 08/10/2019  Weight (lbs) 229 lb 4.5 oz 235 lb 14.3 oz 209 lb  Weight (kg) 104 kg 107 kg  94.802 kg     Body mass index is 33.86 kg/m.  General: Obese male that also appears frail, in no acute distress HEENT: normal Lymph: no adenopathy Neck: no JVD Endocrine:  No thryomegaly Vascular: No carotid bruits; FA pulses 2+ bilaterally without bruits  Cardiac:  normal S1, S2; RRR; no murmur  Lungs: Scattered rhonchi.  Difficult to auscultate lung sounds due to constant coughing Abd: soft, nontender, no hepatomegaly  Ext: Very mild lower extremity edema Musculoskeletal:  Decreased range of motion and tenderness of the left shoulder Skin: warm and dry  Neuro:  CNs 2-12 intact, no focal abnormalities noted Psych:  Normal affect   EKG:  The EKG was personally reviewed and demonstrates:  Atrial-sensed ventricular-paced rhythm, 110 bpm, no significant change from prior EKGs Telemetry:  Telemetry was personally reviewed and demonstrates: Ventricular pacing with rates 108-111 bpm  Relevant CV Studies:  Echocardiogram 08/09/2019 IMPRESSIONS  1. Left ventricular ejection fraction, by visual estimation, is 40 to 45%. The left ventricle has mildly decreased function. There is no left ventricular hypertrophy.  2. The left ventricle has no regional wall motion abnormalities.  3. Global right ventricle has severely reduced systolic function.The right ventricular size is normal. No increase in right ventricular wall thickness.  4. Left atrial size was mildly dilated.  5. Right atrial size was mildly dilated.  6. The mitral valve is grossly normal. Mild mitral valve regurgitation.  7. The tricuspid valve is grossly normal.  8. The aortic valve is tricuspid. Aortic valve regurgitation is not visualized. Mild aortic valve sclerosis without stenosis.  9. The pulmonic valve was normal in structure. Pulmonic valve regurgitation is not visualized. 10. Mildly elevated pulmonary artery systolic pressure. 11. The atrial septum is grossly normal.  Echocardiogram 01/01/2018 -EF 29-51%, grade 1 diastolic  dysfunction  Echocardiogram 11/16/2015 -EF 88-41%, diastolic dysfunction  Cardiac catheterization 03/04/2002   Laboratory Data:  High Sensitivity Troponin:   Recent Labs  Lab 08/22/2019 2045 08/09/19 0120  TROPONINIHS 75* 133*     Chemistry Recent Labs  Lab 08/07/2019 2045 08/06/2019 2101 08/09/19 0120 08/09/19 0509  NA 135 141  --  139  K 4.2 4.2  --  4.6  CL 93* 97*  --  94*  CO2 24  --   --  24  GLUCOSE 139* 135*  --  154*  BUN 41* 43*  --  48*  CREATININE 5.93* 6.00* 6.30* 8.14*  CALCIUM 9.0  --   --  9.1  GFRNONAA 8*  --  7* 5*  GFRAA 9*  --  9* 6*  ANIONGAP 18*  --   --  21*    Recent Labs  Lab 08/20/2019 2045  PROT 7.2  ALBUMIN 3.1*  AST 54*  ALT 30  ALKPHOS 110  BILITOT 2.0*   Hematology Recent Labs  Lab 08/09/19 0509 08/10/19 0031 08/10/19 0807  WBC 3.2* 4.5 4.9  RBC 2.81* 2.84* 2.88*  HGB 8.8* 9.0* 8.9*  HCT 28.9* 29.1* 29.0*  MCV 102.8* 102.5* 100.7*  MCH 31.3 31.7 30.9  MCHC 30.4 30.9 30.7  RDW 19.2* 19.1* 18.8*  PLT 46* 46* 44*   BNPNo results for input(s): BNP, PROBNP in the last 168 hours.  DDimer  Recent Labs  Lab 09/01/2019 2045  DDIMER 7.37*     Radiology/Studies:  DG Chest 1 View  Result Date: 08/09/2019 CLINICAL DATA:  Fever. Shortness of breath. EXAM: CHEST  1 VIEW COMPARISON:  August 08, 2019 FINDINGS: The heart size remains enlarged. The patient is status post prior median sternotomy. Multi lead left-sided pacemaker is noted. There is no pneumothorax. There is a persistent opacity at the right lung base which may represent a combination of a right-sided pleural effusion and atelectasis or infiltrate. There appears to be a small left-sided pleural effusion with adjacent atelectasis. IMPRESSION: 1. Low lung volumes with a persistent right basilar opacity which may represent a combination of atelectasis and a small right-sided pleural effusion. An infiltrate is difficult to entirely exclude.  2. Cardiomegaly. 3. Otherwise, stable  appearance of the chest. Electronically Signed   By: Constance Holster M.D.   On: 08/09/2019 21:19   DG Chest Portable 1 View  Result Date: 08/31/2019 CLINICAL DATA:  Shortness of breath. EXAM: PORTABLE CHEST 1 VIEW COMPARISON:  02/07/2019 FINDINGS: Previous median sternotomy, CABG and pacemaker/AICD placement. Poor inspiration. Interstitial edema. Basilar volume loss. Findings most consistent with heart failure. IMPRESSION: Probable congestive heart failure. Cardiomegaly. Venous hypertension and interstitial edema. Poor inspiration with basilar volume loss. Electronically Signed   By: Nelson Chimes M.D.   On: 08/16/2019 21:31   ECHOCARDIOGRAM COMPLETE  Result Date: 08/09/2019   ECHOCARDIOGRAM REPORT   Patient Name:   ORDELL PRICHETT Date of Exam: 08/09/2019 Medical Rec #:  [id number]        Height:       69.0 in Accession #:    [id number]       Weight:       200.6 lb Date of Birth:  1934-04-19         BSA:          2.07 m Patient Age:    62 years         BP:           103/58 mmHg Patient Gender: M                HR:           88 bpm. Exam Location:  Inpatient Procedure: 2D Echo Indications:    CHF 428  History:        Patient has prior history of Echocardiogram examinations, most                 recent 01/01/2018. CHF, CAD; Risk Factors:Hypertension, Diabetes                 and Dyslipidemia. AICD.  Sonographer:    Jannett Celestine RDCS (AE) Referring Phys: [place]  Sonographer Comments: limited mobility (left shoulder issue)...supine exam. off axis parasternal window IMPRESSIONS  1. Left ventricular ejection fraction, by visual estimation, is 40 to 45%. The left ventricle has mildly decreased function. There is no left ventricular hypertrophy.  2. The left ventricle has no regional wall motion abnormalities.  3. Global right ventricle has severely reduced systolic function.The right ventricular size is normal. No increase in right ventricular wall thickness.  4. Left atrial size was mildly dilated.   5. Right atrial size was mildly dilated.  6. The mitral valve is grossly normal. Mild mitral valve regurgitation.  7. The tricuspid valve is grossly normal.  8. The aortic valve is tricuspid. Aortic valve regurgitation is not visualized. Mild aortic valve sclerosis without stenosis.  9. The pulmonic valve was normal in structure. Pulmonic valve regurgitation is not visualized. 10. Mildly elevated pulmonary artery systolic pressure. 11. The atrial septum is grossly normal. FINDINGS  Left Ventricle: Left ventricular ejection fraction, by visual estimation, is 40 to 45%. The left ventricle has mildly decreased function. The left ventricle has no regional wall motion abnormalities. There is no left ventricular hypertrophy. Left ventricular diastolic parameters were normal. Right Ventricle: The right ventricular size is normal. No increase in right ventricular wall thickness. Global RV systolic function is has severely reduced systolic function. The tricuspid regurgitant velocity is 2.37 m/s, and with an assumed right atrial pressure of 10 mmHg, the estimated right ventricular systolic pressure is mildly elevated at 32.5 mmHg. Left Atrium: Left atrial size was  mildly dilated. Right Atrium: Right atrial size was mildly dilated Pericardium: There is no evidence of pericardial effusion. Mitral Valve: The mitral valve is grossly normal. Mild mitral valve regurgitation. Tricuspid Valve: The tricuspid valve is grossly normal. Tricuspid valve regurgitation moderate. Aortic Valve: The aortic valve is tricuspid. . There is moderate thickening and moderate calcification of the aortic valve. Aortic valve regurgitation is not visualized. Mild aortic valve sclerosis is present, with no evidence of aortic valve stenosis. Moderate aortic valve annular calcification. There is moderate thickening of the aortic valve. There is moderate calcification of the aortic valve. Aortic valve mean gradient measures 11.0 mmHg. Aortic valve peak  gradient measures 17.3 mmHg. Aortic valve  area, by VTI measures 2.46 cm. Pulmonic Valve: The pulmonic valve was normal in structure. Pulmonic valve regurgitation is not visualized. Pulmonic regurgitation is not visualized. Aorta: The aortic root and ascending aorta are structurally normal, with no evidence of dilitation. IAS/Shunts: The atrial septum is grossly normal.  LEFT VENTRICLE PLAX 2D LVIDd:         5.12 cm  Diastology LVIDs:         3.78 cm  LV e' lateral:   12.50 cm/s LV PW:         1.37 cm  LV E/e' lateral: 8.2 LV IVS:        0.96 cm  LV e' medial:    8.05 cm/s LVOT diam:     2.50 cm  LV E/e' medial:  12.7 LV SV:         64 ml LV SV Index:   29.96 LVOT Area:     4.91 cm  RIGHT VENTRICLE TAPSE (M-mode): 0.9 cm LEFT ATRIUM             Index       RIGHT ATRIUM           Index LA diam:        5.20 cm 2.51 cm/m  RA Area:     23.40 cm LA Vol (A2C):   77.6 ml 37.51 ml/m RA Volume:   68.40 ml  33.06 ml/m LA Vol (A4C):   66.1 ml 31.95 ml/m LA Biplane Vol: 73.1 ml 35.34 ml/m  AORTIC VALVE AV Area (Vmax):    2.48 cm AV Area (Vmean):   2.39 cm AV Area (VTI):     2.46 cm AV Vmax:           208.00 cm/s AV Vmean:          157.000 cm/s AV VTI:            0.390 m AV Peak Grad:      17.3 mmHg AV Mean Grad:      11.0 mmHg LVOT Vmax:         105.00 cm/s LVOT Vmean:        76.400 cm/s LVOT VTI:          0.196 m LVOT/AV VTI ratio: 0.50  AORTA Ao Root diam: 3.20 cm MITRAL VALVE                         TRICUSPID VALVE MV Area (PHT): 4.49 cm              TR Peak grad:   22.5 mmHg MV PHT:        49.01 msec            TR Vmax:        266.00 cm/s  MV Decel Time: 169 msec MR Peak grad:    72.9 mmHg           SHUNTS MR Mean grad:    42.0 mmHg           Systemic VTI:  0.20 m MR Vmax:         427.00 cm/s         Systemic Diam: 2.50 cm MR Vmean:        301.0 cm/s MR PISA:         2.26 cm MR PISA Eff ROA: 15 mm MR PISA Radius:  0.60 cm MV E velocity: 102.00 cm/s 103 cm/s MV A velocity: 64.30 cm/s  70.3 cm/s MV E/A ratio:   1.59        1.5  Mertie Moores MD Electronically signed by Mertie Moores MD Signature Date/Time: 08/09/2019/4:47:06 PM    Final          Assessment and Plan:   Pre-operative evaluation -Unclear what type of surgery may be needed.  The patient had an aspiration of his knee today with aspirate sent for analysis, low suspicion for septic joint. -Patient with history of CAD and chronic systolic and diastolic heart failure/ischemic cardiomyopathy. -Patient appears to be mildly decompensated with his heart failure and is being treated for pneumonia.  No chest pain.  Currently has a significant productive cough.  -I feel that the patient would be moderate to high risk for any type of surgery in his current condition.  Would advise conservative therapy if possible.  Could reassess once he improves from a respiratory standpoint and treatment for pneumonia. Dr. Burt Knack to see pt. -Pt is on Plavix which may give him an increased bleeding risk.   Acute on chronic combined systolic and diastolic heart failure/ischemic cardiomyopathy -EF 40-45% on echo done yesterday, no LVH or regional wall motion abnormalities.  Mildly dilated bilateral atria.  There was mild mitral regurgitation but otherwise no significant valvular abnormalities. -No longer on HF medications due to low BP's esp with Dialysis. Fluid management per HD.  -Chest x-ray on 08/14/2019 showed probable congestive heart failure, interstitial edema. -Patient has had dialysis yesterday and today.  He is unable to tell me if his breathing feels better.  He has very little lower extremity edema.  CAD -History of remote CABG 1995, last cath in 2003 that showed patent LIMA to LAD and RIMA to OM.  There was total occlusion of the RCA with total occlusion of vein grafts to the PDA and proximal LAD with known retrograde collaterals from both the OM and LAD grafts.  This was considered stable and he was continued on medical therapy. -Last stress test was in 2010  showing minimal reversibility and low EF. -Medical management with aspirin 81 mg, statin, Plavix. -Troponins mildly elevated at 75, 133.  Fever/suspected pneumonia -Patient with reported fever with tremors and dyspnea last night.  Also has productive cough. -Chest x-ray showed low lung volumes, persistent right basilar opacity which may represent combination of atelectasis, small right pleural effusion and an infiltrate cannot be excluded. -COVID-19 testing was negative. -Patient has been treated with empiric IV antibiotics per primary team.  ESRD on hemodialysis -Followed by nephrology.  Patient had dialysis yesterday and again today.  Hypertension -Not on any antihypertensives since starting dialysis and having low blood pressures. -BP's 80's-90's  Hyperlipidemia -On high intensity statin with atorvastatin 80 mg daily  PAD -PTA and covered stenting of the left common iliac artery for chronic total  occlusion by Dr. Gwenlyn Found 02/04/2019.  Follow-up vascular ultrasound on 03/02/2019 showed widely patent stent.  Patient was planned for repeat ABI 6 months, was scheduled for yesterday, not done. -Continues on aspirin and plavix.  Carotid artery disease -R ICA 40-59% stenosis by carotid ultrasound in 01/2019.  Plan follow-up study in 1 year.  Presence of [employer] CRT-D -Followed by Dr. Caryl Comes -Most recent download on 05/28/2019 showed normal functioning, high RV threshold.  History of VT -Occurred in setting of severe hypokalemia.  Patient was started on amiodarone which continues.  Diabetes type 2 -A1c 4.8, well controlled  Chronic hypoxic respiratory failure/OSA intolerant to CPAP -Patient uses oxygen 2 L/min at night      For questions or updates, please contact [facility] Please consult [email or web address] for contact info under     Signed, Daune Perch, NP  08/10/2019 4:01 PM  Patient seen, examined. Available data reviewed. Agree with findings, assessment, and plan as  outlined by Pecolia Ades, NP. On my exam: Vitals:   08/10/19 1357 08/10/19 1505  BP: (!) 91/49 (!) 101/48  Pulse: (!) 113 (!) 108  Resp: (!) 21 20  Temp: 99.6 F (37.6 C)   SpO2: 97% 100%   Pt is alert and oriented, elderly male, mild distress, generally uncomfortable HEENT: normal Neck: JVP - normal, carotids 2+= without bruits Lungs: Rhonchi bilaterally, diminished breath sounds right base CV: RRR without murmur or gallop Abd: soft, NT, Positive BS, no hepatomegaly Ext: 1+ edema on the left, trace edema on the right Skin: warm/dry no rash  History is somewhat difficult from the patient due to his frequent coughing.  His wife is present at the bedside and provides much of the history.  The patient is 86 years old with end-stage renal disease, ischemic cardiomyopathy, now admitted with a combination of acute on chronic combined systolic and diastolic heart failure as well as community-acquired pneumonia.  He may require right knee surgery for a septic joint.  I think he would be at high risk of any surgery under general anesthesia in his current state with active pneumonia and heart failure.  If he requires surgery, it would be ideal to do him with some type of nerve block or spinal anesthesia. Unfortunately, I do not see any way to optimize his congestive heart failure as his blood pressure runs too low with dialysis.  We will have to continue to treat him with volume removal by dialysis.  He remains on dual antiplatelet therapy with aspirin and clopidogrel after undergoing peripheral artery stenting about 6 months ago.  He is not profoundly volume overload on physical examination.    Sherren Mocha, M.D. 08/10/2019 6:25 PM

## 2019-08-10 NOTE — Telephone Encounter (Signed)
Home Health Verbal Orders - Caller/Agency: [facility] Number: [phone] Requesting OT/PT/Skilled Nursing/Social Work/Speech Therapy: PT/OT/Home Health Aid Frequency: PT: 1x1wk, 2x6wk, 1x2wk beginning 07/27/19  Home Health Aide 1x1wk, 2x3wk  OT eval

## 2019-08-10 NOTE — Telephone Encounter (Signed)
Gave ok for orders per Dr. Burns.  

## 2019-08-10 NOTE — Progress Notes (Signed)
PHARMACY - PHYSICIAN COMMUNICATION CRITICAL VALUE ALERT - BLOOD CULTURE IDENTIFICATION (BCID)  Shane [name] is an 86 y.o. male who presented to [facility] on 08/26/2019 with a chief complaint of respiratory failure  Assessment: He is on antibiotics for fever with suspected PNA/septic arthritis. Blood cultures show GPC in 1/7 bottles. Synovial fluid cultures (from the knee are pending). Knee aspirate also shows leukocytosis and GPC.   Current antibiotics: vancomycin and cefepime  Changes to prescribed antibiotics recommended:  -no changes now, follow further culture results  No results found. However, due to the size of the patient record, not all encounters were searched. Please check Results Review for a complete set of results.  Hildred Laser, PharmD Clinical Pharmacist **Pharmacist phone directory can now be found on [email or web address] ([email or web address]).  Listed under [place].

## 2019-08-10 NOTE — Procedures (Addendum)
Procedure: Left knee aspiration and injection  Indication: Left knee effusion(s)  Surgeon: Silvestre Gunner, PA-C  Assist: None  Anesthesia: Topical refrigerant  EBL: None  Complications: None  Findings: After risks/benefits explained patient desires to undergo procedure. Consent obtained and time out performed. The left knee was sterilely prepped and aspirated. 50ml turbid cloudy fluid obtained and sent for analysis. Instilled 71ml 0.5% Marcaine. Pt tolerated the procedure well.    Lisette Abu, PA-C Orthopedic Surgery [phone]

## 2019-08-10 NOTE — Progress Notes (Signed)
Pharmacy Antibiotic Note  Shane [name] is a 86 y.o. male with ESRD, fevers and possible PNA .  Pharmacy has been consulted for Vancomycin and Cefepime dosing.  Plan: Vancomycin 1750 mg IV now, then 1 g IV after each HD Cefepime 1 g IV q24h   Weight: 209 lb (94.8 kg)  Temp (24hrs), Avg:98.5 F (36.9 C), Min:97.5 F (36.4 C), Max:100.2 F (37.9 C)  Recent Labs  Lab 08/14/2019 2045 09/03/2019 2101 08/09/19 0120 08/09/19 0509 08/09/19 2153 08/10/19 0031  WBC 4.2  --  3.1* 3.2*  --  4.5  CREATININE 5.93* 6.00* 6.30* 8.14*  --   --   LATICACIDVEN 3.8*  --  1.7  --  2.9* 2.0*    Estimated Creatinine Clearance: 7.7 mL/min (A) (by C-G formula based on SCr of 8.14 mg/dL (H)).    Allergies  Allergen Reactions  . Codeine Other (See Comments)    anxiety  . Dacarbazine Other (See Comments)  . Hydrocodone Other (See Comments)    Anxiety- can take in liquid form  . Pseudoephedrine Other (See Comments)    Causes heart to race  . Azithromycin Rash     Caryl Pina 08/10/2019 7:26 AM

## 2019-08-10 NOTE — Progress Notes (Addendum)
PROGRESS NOTE   Shane [name]  YNW:[id number]    DOB: 01-Nov-1933    DOA: 08/31/2019  PCP: [name], MD   I have briefly reviewed patients previous medical records in [facility].  Chief Complaint:   Chief Complaint  Patient presents with  . Shortness of Breath    Brief Narrative:  86 year old married male, lives with spouse and granddaughter, ambulates with the help of walker, PMH of ESRD on MWF HD, chronic systolic CHF, last LVEF 65-78% in 2019, s/p AICD, HTN, DM 2, HLD, CAD, chronic hypoxic respiratory failure on home oxygen 2 L/min at night, OSA reportedly cannot tolerate CPAP, frequent falls (6-7 times in the last 6 weeks), reported left shoulder fracture and right little toe fracture following a fall, presented to [facility] ED on 1/3 due to progressive dyspnea, hypoxia, lower extremity edema despite compliance with his outpatient HD.  Admitted for acute on chronic hypoxic respiratory failure due to decompensated CHF in a dialysis patient.  Nephrology consulted, s/p HD on 1/4.  Course complicated by febrile illness, suspected pneumonia, acute left knee arthritis (orthopedic consulted).   Assessment & Plan:  Principal Problem:   Acute exacerbation of CHF (congestive heart failure) (HCC) Active Problems:   Hypercholesterolemia   DM (diabetes mellitus), type 2 with renal complications (HCC)   Benign hypertensive heart disease without heart failure   Severe obstructive sleep apnea   Chronic combined systolic and diastolic CHF (congestive heart failure) (HCC)   ICD (implantable cardioverter-defibrillator) discharge   ESRD (end stage renal disease) (HCC)   GERD (gastroesophageal reflux disease)   Acute on chronic hypoxic respiratory failure (chronically on home oxygen 2 L/min at bedtime only)  Suspected due to decompensated CHF and possible pneumonia.    Continues to be dyspneic.  Agree with nephrology for repeat HD today for volume management.  Continue treatment as below for  suspected pneumonia.  Fever/suspected lobar pneumonia/suspected septic arthritis.  RN documented fever of 101.9 F with tremors and dyspnea last night.  Has productive cough.  Chest x-ray 1/4: Low lung volumes, persistent right basilar opacity which may represent combination of atelectasis, small right-sided pleural effusion and an infiltrate cannot be excluded.  Flu panel PCR and COVID-19 testing - 09/02/2019.  Blood culture x1 from 1/4 and 1/3: Negative to date.  Follow blood cultures x2 from 1/5.  Continue empiric IV cefepime and vancomycin.  Acute on chronic systolic CHF/ICM  In a dialysis patient.  Volume management across dialysis.  Reports compliance with outpatient HD.  Suspect that his dialysis parameters have to be adjusted by nephrology.  TTE 1/4: LVEF 40-45%, no LVH or regional wall motion abnormalities.  EF same as in 12/2017.  Aggressive volume management across HD.  Troponin elevation from 75 > 133 likely due to febrile illness, suspected pneumonia, decompensated CHF, hypoxia in dialysis patient due to demand ischemia.  Patient on amiodarone PTA, unclear etiology.  History of VT/AICD/single episode of A. fib.  Continue amiodarone.  Follows with outpatient EP cardiology.  CAD  No anginal symptoms.  Continue aspirin, Plavix and statins.  No beta-blockers due to hypotension with HD.  ESRD on MWF HD  Nephrology consulted and s/p HD on 1/4 but still quite volume overloaded.  Discussed with Dr. Carolin Sicks 1/4 who plans repeat HD today and continuing his scheduled HD.  OSA, intolerant of CPAP  Continue nightly oxygen 2 L/min at bedtime.  Frequent falls  Reports that he is fallen 6-7 times in the last 6 weeks, "broken" his  left shoulder (Dr. Linda Hedges, orthopedics) and right little toe.  Indicates that his knees give away.  Unable to use left upper extremity sling due to need to use walker.  PT evaluation.  No pain in left shoulder or right little toe reported.  CT left  shoulder 06/25/2019: Full-thickness retracted rotator cuff tear, chronic ununited acromial fracture versus os acromial.  Left rotator cuff tear/chronic pain and right little toe fracture  Outpatient follow-up with orthopedics, Dr. Linda Hedges.  Acute left knee arthritis, suspected septic arthritis.  On 1/5 reported painful swelling of left knee.  Unable to recollect trauma.  Reports history of gout.  Orthopedics consulted, aspirated knee which showed purulent material with significant neutrophilic leukocytosis and gram-positive cocci.  I discussed with orthopedics who are considering arthroscopic washout under GA.  Discussed with cardiology and agree with cardiology preop evaluation the patient will be a high risk for any procedure or surgery under general anesthesia and hence preferable under some form of non-GA.  Already on cefepime and vancomycin for pneumonia.  Type II DM with renal complications, hypoglycemia  Hypoglycemia up to 49 mg per DL.  Discontinued SSI.  Continue to monitor CBGs.  Treat per hypoglycemia protocol.  If has persistent hyperglycemia greater than 180, then consider initiating super sensitive SSI.  Essential hypertension  Soft blood pressures.  Not on antihypertensives.  GERD  PPI.  Hyperlipidemia  PAD   Reports stenting left lower extremity by Dr. [name], cardiology.  Left foot slightly cool but no acute ischemic changes.  Diminished pulses.  Ordered ABI, pending.  Continue aspirin, Plavix and statins.  Pancytopenia  Unclear etiology.  Also has macrocytosis.  Hemoglobin appears stable compared to his baseline.  Platelet count seems to have gradually declined over the last year, down to 46.?  Bone marrow issues and may need outpatient hematology consultation.  Discontinued heparin DVT prophylaxis.  SCDs.  Follow CBC periodically   DVT prophylaxis: SCD. Code Status: Full Family Communication: None at bedside. I was unable to reach his spouse via  phone. I discussed with patient's son in detail, updated care and answered questions.  Advised him regarding how he will the patient is.  He verbalized understanding. Disposition: To be determined pending clinical improvement.  PT recommends home health PT.   Consultants:   Nephrology  Procedures:   HD 1/4  Antimicrobials:   None   Subjective:  Reports left knee pain and swelling.  Chronic left shoulder pain.  Cannot recollect trauma to left knee.  Dyspnea worse than yesterday.  Cough productive of brown/yellow sputum.  No chest pain.  Per nursing, was sitting up in the chair for 2 hours this morning.  Objective:   Vitals:   08/10/19 1150 08/10/19 1157 08/10/19 1230 08/10/19 1300  BP: 104/69 104/69 (!) 85/50 (!) 99/53  Pulse: 99 99 (!) 114 (!) 118  Resp: (!) 30   (!) 26  Temp: 99.8 F (37.7 C)     TempSrc: Oral     SpO2: 98%     Weight: 107 kg       General exam: Elderly male, moderately built and overweight lying propped up in bed with mild increased work of breathing.  Looks slightly worse compared to yesterday postdialysis. Respiratory system: Reduced breath sounds in the bases with few basal crackles.  Rest clear without wheezing or rhonchi.  Mild increased work of breathing. Cardiovascular system: S1 & S2 heard, RRR. No JVD, murmurs, rubs, gallops or clicks.  1+ pitting edema.  Telemetry personally reviewed:  Sinus rhythm/V paced rhythm. Gastrointestinal system: Abdomen is nondistended, soft and nontender. No organomegaly or masses felt. Normal bowel sounds heard. Central nervous system: Alert and oriented. No focal neurological deficits.,?  Hard of hearing. Extremities: Symmetric 5 x 5 power.  Limited left upper movement at shoulder.  Appears to have an ulcer over his right little toe without acute findings.  Right upper arm AV fistula thrill.  Left knee mildly swollen and warm without tenderness or crepitus, appears new compared to yesterday. Skin: Subacute fist-sized  bruise over left buttock. Psychiatry: Judgement and insight appear impaired. Mood & affect appropriate.     Data Reviewed:   I have personally reviewed following labs and imaging studies   CBC: Recent Labs  Lab 08/24/2019 2045 08/28/2019 2101 08/09/19 0509 08/10/19 0031 08/10/19 0807  WBC 4.2   < > 3.2* 4.5 4.9  NEUTROABS 2.6  --  2.7  --   --   HGB 10.0*  --  8.8* 9.0* 8.9*  HCT 34.1*  --  28.9* 29.1* 29.0*  MCV 105.2*   < > 102.8* 102.5* 100.7*  PLT 61*   < > 46* 46* 44*   < > = values in this interval not displayed.    Basic Metabolic Panel: Recent Labs  Lab 08/07/2019 2045 08/19/2019 2101 08/09/19 0120 08/09/19 0509  NA 135 141  --  139  K 4.2 4.2  --  4.6  CL 93* 97*  --  94*  CO2 24  --   --  24  GLUCOSE 139* 135*  --  154*  BUN 41* 43*  --  48*  CREATININE 5.93* 6.00* 6.30* 8.14*  CALCIUM 9.0  --   --  9.1    Liver Function Tests: Recent Labs  Lab 08/12/2019 2045  AST 54*  ALT 30  ALKPHOS 110  BILITOT 2.0*  PROT 7.2  ALBUMIN 3.1*    CBG: Recent Labs  Lab 08/10/19 0642 08/10/19 0721 08/10/19 1138  GLUCAP 49* 136* 80    Microbiology Studies:   Recent Results (from the past 240 hour(s))  Blood Culture (routine x 2)     Status: None (Preliminary result)   Collection Time: 08/09/2019  8:50 PM   Specimen: BLOOD LEFT FOREARM  Result Value Ref Range Status   Specimen Description BLOOD LEFT FOREARM  Final   Special Requests   Final    BOTTLES DRAWN AEROBIC AND ANAEROBIC Blood Culture adequate volume   Culture   Final    NO GROWTH 2 DAYS Performed at [facility], [place] [address], [place], [place] [address]    Report Status PENDING  Incomplete  Respiratory Panel by RT PCR (Flu A&B, Covid) - Nasopharyngeal Swab     Status: None   Collection Time: 09/01/2019 10:11 PM   Specimen: Nasopharyngeal Swab  Result Value Ref Range Status   SARS Coronavirus 2 by RT PCR NEGATIVE NEGATIVE Final    Comment: (NOTE) SARS-CoV-2 target nucleic acids are NOT  DETECTED. The SARS-CoV-2 RNA is generally detectable in upper respiratoy specimens during the acute phase of infection. The lowest concentration of SARS-CoV-2 viral copies this assay can detect is 131 copies/mL. A negative result does not preclude SARS-Cov-2 infection and should not be used as the sole basis for treatment or other patient management decisions. A negative result may occur with  improper specimen collection/handling, submission of specimen other than nasopharyngeal swab, presence of viral mutation(s) within the areas targeted by this assay, and inadequate number of viral copies (<  131 copies/mL). A negative result must be combined with clinical observations, patient history, and epidemiological information. The expected result is Negative. Fact Sheet for Patients:  PinkCheek.be Fact Sheet for Healthcare Providers:  GravelBags.it This test is not yet ap proved or cleared by the [place] FDA and  has been authorized for detection and/or diagnosis of SARS-CoV-2 by FDA under an Emergency Use Authorization (EUA). This EUA will remain  in effect (meaning this test can be used) for the duration of the COVID-19 declaration under Section 564(b)(1) of the Act, 21 U.S.C. section 360bbb-3(b)(1), unless the authorization is terminated or revoked sooner.    Influenza A by PCR NEGATIVE NEGATIVE Final   Influenza B by PCR NEGATIVE NEGATIVE Final    Comment: (NOTE) The Xpert Xpress SARS-CoV-2/FLU/RSV assay is intended as an aid in  the diagnosis of influenza from Nasopharyngeal swab specimens and  should not be used as a sole basis for treatment. Nasal washings and  aspirates are unacceptable for Xpert Xpress SARS-CoV-2/FLU/RSV  testing. Fact Sheet for Patients: PinkCheek.be Fact Sheet for Healthcare Providers: GravelBags.it This test is not yet approved or cleared  by the [place] FDA and  has been authorized for detection and/or diagnosis of SARS-CoV-2 by  FDA under an Emergency Use Authorization (EUA). This EUA will remain  in effect (meaning this test can be used) for the duration of the  Covid-19 declaration under Section 564(b)(1) of the Act, 21  U.S.C. section 360bbb-3(b)(1), unless the authorization is  terminated or revoked. Performed at [facility], [place] [address] [place], [place] [address]   Blood Culture (routine x 2)     Status: None (Preliminary result)   Collection Time: 08/09/19  1:20 AM   Specimen: BLOOD RIGHT HAND  Result Value Ref Range Status   Specimen Description BLOOD RIGHT HAND  Final   Special Requests   Final    BOTTLES DRAWN AEROBIC AND ANAEROBIC Blood Culture adequate volume   Culture   Final    NO GROWTH 1 DAY Performed at [facility], [place] [address] [place], [place] [address]    Report Status PENDING  Incomplete     Radiology Studies:  DG Chest 1 View  Result Date: 08/09/2019 CLINICAL DATA:  Fever. Shortness of breath. EXAM: CHEST  1 VIEW COMPARISON:  August 08, 2019 FINDINGS: The heart size remains enlarged. The patient is status post prior median sternotomy. Multi lead left-sided pacemaker is noted. There is no pneumothorax. There is a persistent opacity at the right lung base which may represent a combination of a right-sided pleural effusion and atelectasis or infiltrate. There appears to be a small left-sided pleural effusion with adjacent atelectasis. IMPRESSION: 1. Low lung volumes with a persistent right basilar opacity which may represent a combination of atelectasis and a small right-sided pleural effusion. An infiltrate is difficult to entirely exclude. 2. Cardiomegaly. 3. Otherwise, stable appearance of the chest. Electronically Signed   By: Constance Holster M.D.   On: 08/09/2019 21:19   ECHOCARDIOGRAM COMPLETE  Result Date: 08/09/2019   ECHOCARDIOGRAM REPORT   Patient Name:   Shane [name] Date of Exam: 08/09/2019 Medical Rec #:  [id number]        Height:       69.0 in Accession #:    [id number]       Weight:       200.6 lb Date of Birth:  07-29-1934         BSA:  2.07 m Patient Age:    27 years         BP:           103/58 mmHg Patient Gender: M                HR:           88 bpm. Exam Location:  Inpatient Procedure: 2D Echo Indications:    CHF 428  History:        Patient has prior history of Echocardiogram examinations, most                 recent 01/01/2018. CHF, CAD; Risk Factors:Hypertension, Diabetes                 and Dyslipidemia. AICD.  Sonographer:    Jannett Celestine RDCS (AE) Referring Phys: [place]  Sonographer Comments: limited mobility (left shoulder issue)...supine exam. off axis parasternal window IMPRESSIONS  1. Left ventricular ejection fraction, by visual estimation, is 40 to 45%. The left ventricle has mildly decreased function. There is no left ventricular hypertrophy.  2. The left ventricle has no regional wall motion abnormalities.  3. Global right ventricle has severely reduced systolic function.The right ventricular size is normal. No increase in right ventricular wall thickness.  4. Left atrial size was mildly dilated.  5. Right atrial size was mildly dilated.  6. The mitral valve is grossly normal. Mild mitral valve regurgitation.  7. The tricuspid valve is grossly normal.  8. The aortic valve is tricuspid. Aortic valve regurgitation is not visualized. Mild aortic valve sclerosis without stenosis.  9. The pulmonic valve was normal in structure. Pulmonic valve regurgitation is not visualized. 10. Mildly elevated pulmonary artery systolic pressure. 11. The atrial septum is grossly normal. FINDINGS  Left Ventricle: Left ventricular ejection fraction, by visual estimation, is 40 to 45%. The left ventricle has mildly decreased function. The left ventricle has no regional wall motion abnormalities. There is no left ventricular hypertrophy. Left  ventricular diastolic parameters were normal. Right Ventricle: The right ventricular size is normal. No increase in right ventricular wall thickness. Global RV systolic function is has severely reduced systolic function. The tricuspid regurgitant velocity is 2.37 m/s, and with an assumed right atrial pressure of 10 mmHg, the estimated right ventricular systolic pressure is mildly elevated at 32.5 mmHg. Left Atrium: Left atrial size was mildly dilated. Right Atrium: Right atrial size was mildly dilated Pericardium: There is no evidence of pericardial effusion. Mitral Valve: The mitral valve is grossly normal. Mild mitral valve regurgitation. Tricuspid Valve: The tricuspid valve is grossly normal. Tricuspid valve regurgitation moderate. Aortic Valve: The aortic valve is tricuspid. . There is moderate thickening and moderate calcification of the aortic valve. Aortic valve regurgitation is not visualized. Mild aortic valve sclerosis is present, with no evidence of aortic valve stenosis. Moderate aortic valve annular calcification. There is moderate thickening of the aortic valve. There is moderate calcification of the aortic valve. Aortic valve mean gradient measures 11.0 mmHg. Aortic valve peak gradient measures 17.3 mmHg. Aortic valve  area, by VTI measures 2.46 cm. Pulmonic Valve: The pulmonic valve was normal in structure. Pulmonic valve regurgitation is not visualized. Pulmonic regurgitation is not visualized. Aorta: The aortic root and ascending aorta are structurally normal, with no evidence of dilitation. IAS/Shunts: The atrial septum is grossly normal.  LEFT VENTRICLE PLAX 2D LVIDd:         5.12 cm  Diastology LVIDs:  3.78 cm  LV e' lateral:   12.50 cm/s LV PW:         1.37 cm  LV E/e' lateral: 8.2 LV IVS:        0.96 cm  LV e' medial:    8.05 cm/s LVOT diam:     2.50 cm  LV E/e' medial:  12.7 LV SV:         64 ml LV SV Index:   29.96 LVOT Area:     4.91 cm  RIGHT VENTRICLE TAPSE (M-mode): 0.9 cm LEFT  ATRIUM             Index       RIGHT ATRIUM           Index LA diam:        5.20 cm 2.51 cm/m  RA Area:     23.40 cm LA Vol (A2C):   77.6 ml 37.51 ml/m RA Volume:   68.40 ml  33.06 ml/m LA Vol (A4C):   66.1 ml 31.95 ml/m LA Biplane Vol: 73.1 ml 35.34 ml/m  AORTIC VALVE AV Area (Vmax):    2.48 cm AV Area (Vmean):   2.39 cm AV Area (VTI):     2.46 cm AV Vmax:           208.00 cm/s AV Vmean:          157.000 cm/s AV VTI:            0.390 m AV Peak Grad:      17.3 mmHg AV Mean Grad:      11.0 mmHg LVOT Vmax:         105.00 cm/s LVOT Vmean:        76.400 cm/s LVOT VTI:          0.196 m LVOT/AV VTI ratio: 0.50  AORTA Ao Root diam: 3.20 cm MITRAL VALVE                         TRICUSPID VALVE MV Area (PHT): 4.49 cm              TR Peak grad:   22.5 mmHg MV PHT:        49.01 msec            TR Vmax:        266.00 cm/s MV Decel Time: 169 msec MR Peak grad:    72.9 mmHg           SHUNTS MR Mean grad:    42.0 mmHg           Systemic VTI:  0.20 m MR Vmax:         427.00 cm/s         Systemic Diam: 2.50 cm MR Vmean:        301.0 cm/s MR PISA:         2.26 cm MR PISA Eff ROA: 15 mm MR PISA Radius:  0.60 cm MV E velocity: 102.00 cm/s 103 cm/s MV A velocity: 64.30 cm/s  70.3 cm/s MV E/A ratio:  1.59        1.5  Mertie Moores MD Electronically signed by Mertie Moores MD Signature Date/Time: 08/09/2019/4:47:06 PM    Final      Scheduled Meds:   . allopurinol  100 mg Oral Daily  . amiodarone  200 mg Oral Daily  . amitriptyline  25 mg Oral QHS  . aspirin EC  81 mg Oral QHS  .  atorvastatin  80 mg Oral QPM  . bupivacaine  10 mL Infiltration Once  . [START ON 08/11/2019] calcitRIOL  0.25 mcg Oral Q M,W,F-HD  . calcium acetate  1,334 mg Oral TID WC  . Chlorhexidine Gluconate Cloth  6 each Topical Q0600  . clopidogrel  75 mg Oral Daily  . fluticasone furoate-vilanterol  1 puff Inhalation Daily  . methylPREDNISolone acetate  80 mg Intra-articular Once  . multivitamin  1 tablet Oral QHS  . mupirocin ointment  1  application Topical BID  . pantoprazole  40 mg Oral Daily  . sodium chloride flush  3 mL Intravenous Q12H    Continuous Infusions:   . sodium chloride    . sodium chloride    . sodium chloride    . ceFEPime (MAXIPIME) IV    . [START ON 08/11/2019] vancomycin       LOS: 2 days     Vernell Leep, MD, [place], [facility]. [facility]    To contact the attending provider between 7A-7P or the covering provider during after hours 7P-7A, please log into the web site [email or web address] and access using universal [place] password for that web site. If you do not have the password, please call the hospital operator.  08/10/2019, 1:39 PM

## 2019-08-10 NOTE — Progress Notes (Signed)
CBG 50 gave 8 oz orange juice.  CBG 49.  Will contact provider.

## 2019-08-10 NOTE — Progress Notes (Signed)
[name] NEPHROLOGY PROGRESS NOTE  Assessment/ Plan: Pt is a 86 y.o. yo male with history of HTN, CAD status post CABG, ESRD on HD MWF, presented with shortness of breath/fluid overload.  Outpt HD: MWF [facility]  4h   400/800   91.5kg   2/2 bath  AVF R  Hep none  - venofer 50 / wk  - mircera 150 ug q2 wks  - calcitriol 1.0 ug tiw  #Acute on chronic respiratory failure with hypoxia due to fluid overload and possible infection.  Try to UF during HD.  # ESRD MWF: Status post HD yesterday with 3500 cc ultrafiltration. We will attempt to do extra treatment today for ultrafiltration.  Continue HD schedule as well.  #Possible pneumonia: Started empiric antibiotics.  Per primary team.  # Anemia of CKD: Monitor hemoglobin.  Holding iron because of possible infection.  # Secondary hyperparathyroidism: Check phosphorus level.  Continue binders.  # HTN/volume blood pressure soft.  Subjective: Seen and examined at bedside.  Still having shortness of breath.  Denies nausea vomiting chest pain.  Using oxygen. Objective Vital signs in last 24 hours: Vitals:   08/09/19 1857 08/09/19 1943 08/10/19 0349 08/10/19 0349  BP:  (!) 146/63  95/74  Pulse:  91  94  Resp:  20  20  Temp: 98.2 F (36.8 C) 100.2 F (37.9 C)  98.2 F (36.8 C)  TempSrc: Oral Oral  Oral  SpO2:  97%  98%  Weight:   94.8 kg    Weight change: 2.5 kg  Intake/Output Summary (Last 24 hours) at 08/10/2019 0855 Last data filed at 08/10/2019 0617 Gross per 24 hour  Intake 1163 ml  Output 3507 ml  Net -2344 ml       Labs: Basic Metabolic Panel: Recent Labs  Lab 08/30/2019 2045 08/23/2019 2101 08/09/19 0120 08/09/19 0509  NA 135 141  --  139  K 4.2 4.2  --  4.6  CL 93* 97*  --  94*  CO2 24  --   --  24  GLUCOSE 139* 135*  --  154*  BUN 41* 43*  --  48*  CREATININE 5.93* 6.00* 6.30* 8.14*  CALCIUM 9.0  --   --  9.1   Liver Function Tests: Recent Labs  Lab 08/12/2019 2045  AST 54*  ALT 30  ALKPHOS 110   BILITOT 2.0*  PROT 7.2  ALBUMIN 3.1*   No results for input(s): LIPASE, AMYLASE in the last 168 hours. No results for input(s): AMMONIA in the last 168 hours. CBC: Recent Labs  Lab 08/26/2019 2045 08/09/19 0120 08/09/19 0509 08/10/19 0031  WBC 4.2 3.1* 3.2* 4.5  NEUTROABS 2.6  --  2.7  --   HGB 10.0* 9.1* 8.8* 9.0*  HCT 34.1* 29.4* 28.9* 29.1*  MCV 105.2* 102.1* 102.8* 102.5*  PLT 61* 48* 46* 46*   Cardiac Enzymes: No results for input(s): CKTOTAL, CKMB, CKMBINDEX, TROPONINI in the last 168 hours. CBG: Recent Labs  Lab 08/09/19 1603 08/09/19 2105 08/10/19 0613 08/10/19 0642 08/10/19 0721  GLUCAP 167* 116* 50* 49* 136*    Iron Studies:  Recent Labs    08/09/19 0120  FERRITIN 2,220*   Studies/Results: DG Chest 1 View  Result Date: 08/09/2019 CLINICAL DATA:  Fever. Shortness of breath. EXAM: CHEST  1 VIEW COMPARISON:  August 08, 2019 FINDINGS: The heart size remains enlarged. The patient is status post prior median sternotomy. Multi lead left-sided pacemaker is noted. There is no pneumothorax. There is a persistent  opacity at the right lung base which may represent a combination of a right-sided pleural effusion and atelectasis or infiltrate. There appears to be a small left-sided pleural effusion with adjacent atelectasis. IMPRESSION: 1. Low lung volumes with a persistent right basilar opacity which may represent a combination of atelectasis and a small right-sided pleural effusion. An infiltrate is difficult to entirely exclude. 2. Cardiomegaly. 3. Otherwise, stable appearance of the chest. Electronically Signed   By: Constance Holster M.D.   On: 08/09/2019 21:19   DG Chest Portable 1 View  Result Date: 08/18/2019 CLINICAL DATA:  Shortness of breath. EXAM: PORTABLE CHEST 1 VIEW COMPARISON:  02/07/2019 FINDINGS: Previous median sternotomy, CABG and pacemaker/AICD placement. Poor inspiration. Interstitial edema. Basilar volume loss. Findings most consistent with heart  failure. IMPRESSION: Probable congestive heart failure. Cardiomegaly. Venous hypertension and interstitial edema. Poor inspiration with basilar volume loss. Electronically Signed   By: Nelson Chimes M.D.   On: 08/24/2019 21:31   ECHOCARDIOGRAM COMPLETE  Result Date: 08/09/2019   ECHOCARDIOGRAM REPORT   Patient Name:   Shane [name] Date of Exam: 08/09/2019 Medical Rec #:  [id number]        Height:       69.0 in Accession #:    [id number]       Weight:       200.6 lb Date of Birth:  [birth year]         BSA:          2.07 m Patient Age:    3 years         BP:           103/58 mmHg Patient Gender: M                HR:           88 bpm. Exam Location:  Inpatient Procedure: 2D Echo Indications:    CHF 428  History:        Patient has prior history of Echocardiogram examinations, most                 recent 01/01/2018. CHF, CAD; Risk Factors:Hypertension, Diabetes                 and Dyslipidemia. AICD.  Sonographer:    Jannett Celestine RDCS (AE) Referring Phys: [place]  Sonographer Comments: limited mobility (left shoulder issue)...supine exam. off axis parasternal window IMPRESSIONS  1. Left ventricular ejection fraction, by visual estimation, is 40 to 45%. The left ventricle has mildly decreased function. There is no left ventricular hypertrophy.  2. The left ventricle has no regional wall motion abnormalities.  3. Global right ventricle has severely reduced systolic function.The right ventricular size is normal. No increase in right ventricular wall thickness.  4. Left atrial size was mildly dilated.  5. Right atrial size was mildly dilated.  6. The mitral valve is grossly normal. Mild mitral valve regurgitation.  7. The tricuspid valve is grossly normal.  8. The aortic valve is tricuspid. Aortic valve regurgitation is not visualized. Mild aortic valve sclerosis without stenosis.  9. The pulmonic valve was normal in structure. Pulmonic valve regurgitation is not visualized. 10. Mildly elevated pulmonary  artery systolic pressure. 11. The atrial septum is grossly normal. FINDINGS  Left Ventricle: Left ventricular ejection fraction, by visual estimation, is 40 to 45%. The left ventricle has mildly decreased function. The left ventricle has no regional wall motion abnormalities. There is no left ventricular hypertrophy. Left ventricular diastolic  parameters were normal. Right Ventricle: The right ventricular size is normal. No increase in right ventricular wall thickness. Global RV systolic function is has severely reduced systolic function. The tricuspid regurgitant velocity is 2.37 m/s, and with an assumed right atrial pressure of 10 mmHg, the estimated right ventricular systolic pressure is mildly elevated at 32.5 mmHg. Left Atrium: Left atrial size was mildly dilated. Right Atrium: Right atrial size was mildly dilated Pericardium: There is no evidence of pericardial effusion. Mitral Valve: The mitral valve is grossly normal. Mild mitral valve regurgitation. Tricuspid Valve: The tricuspid valve is grossly normal. Tricuspid valve regurgitation moderate. Aortic Valve: The aortic valve is tricuspid. . There is moderate thickening and moderate calcification of the aortic valve. Aortic valve regurgitation is not visualized. Mild aortic valve sclerosis is present, with no evidence of aortic valve stenosis. Moderate aortic valve annular calcification. There is moderate thickening of the aortic valve. There is moderate calcification of the aortic valve. Aortic valve mean gradient measures 11.0 mmHg. Aortic valve peak gradient measures 17.3 mmHg. Aortic valve  area, by VTI measures 2.46 cm. Pulmonic Valve: The pulmonic valve was normal in structure. Pulmonic valve regurgitation is not visualized. Pulmonic regurgitation is not visualized. Aorta: The aortic root and ascending aorta are structurally normal, with no evidence of dilitation. IAS/Shunts: The atrial septum is grossly normal.  LEFT VENTRICLE PLAX 2D LVIDd:          5.12 cm  Diastology LVIDs:         3.78 cm  LV e' lateral:   12.50 cm/s LV PW:         1.37 cm  LV E/e' lateral: 8.2 LV IVS:        0.96 cm  LV e' medial:    8.05 cm/s LVOT diam:     2.50 cm  LV E/e' medial:  12.7 LV SV:         64 ml LV SV Index:   29.96 LVOT Area:     4.91 cm  RIGHT VENTRICLE TAPSE (M-mode): 0.9 cm LEFT ATRIUM             Index       RIGHT ATRIUM           Index LA diam:        5.20 cm 2.51 cm/m  RA Area:     23.40 cm LA Vol (A2C):   77.6 ml 37.51 ml/m RA Volume:   68.40 ml  33.06 ml/m LA Vol (A4C):   66.1 ml 31.95 ml/m LA Biplane Vol: 73.1 ml 35.34 ml/m  AORTIC VALVE AV Area (Vmax):    2.48 cm AV Area (Vmean):   2.39 cm AV Area (VTI):     2.46 cm AV Vmax:           208.00 cm/s AV Vmean:          157.000 cm/s AV VTI:            0.390 m AV Peak Grad:      17.3 mmHg AV Mean Grad:      11.0 mmHg LVOT Vmax:         105.00 cm/s LVOT Vmean:        76.400 cm/s LVOT VTI:          0.196 m LVOT/AV VTI ratio: 0.50  AORTA Ao Root diam: 3.20 cm MITRAL VALVE  TRICUSPID VALVE MV Area (PHT): 4.49 cm              TR Peak grad:   22.5 mmHg MV PHT:        49.01 msec            TR Vmax:        266.00 cm/s MV Decel Time: 169 msec MR Peak grad:    72.9 mmHg           SHUNTS MR Mean grad:    42.0 mmHg           Systemic VTI:  0.20 m MR Vmax:         427.00 cm/s         Systemic Diam: 2.50 cm MR Vmean:        301.0 cm/s MR PISA:         2.26 cm MR PISA Eff ROA: 15 mm MR PISA Radius:  0.60 cm MV E velocity: 102.00 cm/s 103 cm/s MV A velocity: 64.30 cm/s  70.3 cm/s MV E/A ratio:  1.59        1.5  Mertie Moores MD Electronically signed by Mertie Moores MD Signature Date/Time: 08/09/2019/4:47:06 PM    Final     Medications: Infusions: . sodium chloride    . ceFEPime (MAXIPIME) IV    . [START ON 08/11/2019] vancomycin    . vancomycin      Scheduled Medications: . allopurinol  100 mg Oral Daily  . amiodarone  200 mg Oral Daily  . amitriptyline  25 mg Oral QHS  . aspirin EC  81 mg  Oral QHS  . atorvastatin  80 mg Oral QPM  . [START ON 08/11/2019] calcitRIOL  0.25 mcg Oral Q M,W,F-HD  . calcium acetate  1,334 mg Oral TID WC  . Chlorhexidine Gluconate Cloth  6 each Topical Q0600  . clopidogrel  75 mg Oral Daily  . fluticasone furoate-vilanterol  1 puff Inhalation Daily  . multivitamin  1 tablet Oral QHS  . mupirocin ointment  1 application Topical BID  . pantoprazole  40 mg Oral Daily  . sodium chloride flush  3 mL Intravenous Q12H    have reviewed scheduled and prn medications.  Physical Exam: General:NAD, comfortable Heart:RRR, s1s2 nl Lungs: Bibasal crackles, no increased work of breathing Abdomen:soft, Non-tender, non-distended Extremities:Trace LE edema Dialysis Access: Right AV fistula.  Sylver Vantassell Prasad Owain Eckerman 08/10/2019,8:55 AM  LOS: 2 days  Pager: [phone]

## 2019-08-10 NOTE — Significant Event (Addendum)
Rapid Response Event Note  Overview: Tachypnea in setting of PNA/Sepsis/fever  Initial Focused Assessment: Notified by nursing staff regarding a second assessment of breathing pattern. Shane [name] is febrile, arousable to loud voice [facility]) and oriented to self, time and place. He drifts off to sleep without stimulation. Skin is hot, dry, pink and mottled in lower extremities. Cap refill is > 3 secs. HR is AV paced 92, BP 83/51 (62), RR 28-30 with sats 100% on 2L [place]. Temp was checked rectally 102.8 F. BBS rhonchi anteriorly with diminished bases. Pt is not in distress or using accessory muscles to breathe. Distant heart sounds. Palpable peripheral pulses x4. 2+ gen edema in BLEs. RUE graft with bruit and thrill. Staff notified M. Denny APP and orders received for PCXR and ABG.   Interventions: -250cc NS bolus  Plan of Care (if not transferred): -treat fever -Sepsis workup if not completed already -recheck CBG if mental status declines -Add ICE or cooling blanket if meds don't control fever  Event Summary: Call received 2225 Arrived 2310 Call ended 0015  Madelynn Done

## 2019-08-10 NOTE — Progress Notes (Signed)
This is a follow-up note/addendum to previous consult note.  I have reviewed the left knee arthrocentesis laboratory analysis.  He does have gout crystals consistent with a gouty arthropathy flareup.  However, his white blood cell count is above the threshold that you would expect with just that alone.  My suspicion is that he may have a concomitant septic arthropathy with the gouty crystal arthropathy.  Given his high risk profile as well as current anticoagulation status I would recommend we treat the gout for 24 to 48 hours with either oral steroids or colchicine in addition to his allopurinol.  Should he have pain relief I think that we can let that stand alone.  If he continues with swelling and pain in the knee that is not treated with the above, or develops more systemic signs of inflammatory process then we would be obligated for a arthroscopic irrigation and debridement of the left knee.   To further bolster his chance for management of this in a nonoperative fashion, I have performed a repeat bedside aspiration, this time a therapeutic aspiration.  We did yield 80 cc of bloody hemarthrosis.  This is somewhat concerning given that he may reaccumulate the blood due to his anticoagulation status, however, this was followed up with a compressive bandage on the knee.   Please treat the acute gout flare however the primary team sees fit given his multiple medical comorbidities I will defer to them.  I will follow up with him on Thursday to see how he is progressing.  No surgery indicated at this time.

## 2019-08-11 ENCOUNTER — Inpatient Hospital Stay (HOSPITAL_COMMUNITY): Payer: Medicare Other

## 2019-08-11 DIAGNOSIS — Z888 Allergy status to other drugs, medicaments and biological substances status: Secondary | ICD-10-CM

## 2019-08-11 DIAGNOSIS — M109 Gout, unspecified: Secondary | ICD-10-CM

## 2019-08-11 DIAGNOSIS — E1122 Type 2 diabetes mellitus with diabetic chronic kidney disease: Secondary | ICD-10-CM

## 2019-08-11 DIAGNOSIS — M84412K Pathological fracture, left shoulder, subsequent encounter for fracture with nonunion: Secondary | ICD-10-CM

## 2019-08-11 DIAGNOSIS — Z87891 Personal history of nicotine dependence: Secondary | ICD-10-CM

## 2019-08-11 DIAGNOSIS — M009 Pyogenic arthritis, unspecified: Secondary | ICD-10-CM | POA: Diagnosis present

## 2019-08-11 DIAGNOSIS — Z4502 Encounter for adjustment and management of automatic implantable cardiac defibrillator: Secondary | ICD-10-CM

## 2019-08-11 DIAGNOSIS — Z885 Allergy status to narcotic agent status: Secondary | ICD-10-CM

## 2019-08-11 DIAGNOSIS — M00062 Staphylococcal arthritis, left knee: Secondary | ICD-10-CM

## 2019-08-11 DIAGNOSIS — Z881 Allergy status to other antibiotic agents status: Secondary | ICD-10-CM

## 2019-08-11 DIAGNOSIS — Z95810 Presence of automatic (implantable) cardiac defibrillator: Secondary | ICD-10-CM

## 2019-08-11 DIAGNOSIS — I5042 Chronic combined systolic (congestive) and diastolic (congestive) heart failure: Secondary | ICD-10-CM

## 2019-08-11 DIAGNOSIS — I739 Peripheral vascular disease, unspecified: Secondary | ICD-10-CM

## 2019-08-11 DIAGNOSIS — G4733 Obstructive sleep apnea (adult) (pediatric): Secondary | ICD-10-CM

## 2019-08-11 DIAGNOSIS — N186 End stage renal disease: Secondary | ICD-10-CM

## 2019-08-11 DIAGNOSIS — R7881 Bacteremia: Secondary | ICD-10-CM

## 2019-08-11 DIAGNOSIS — B9562 Methicillin resistant Staphylococcus aureus infection as the cause of diseases classified elsewhere: Secondary | ICD-10-CM

## 2019-08-11 DIAGNOSIS — Z96651 Presence of right artificial knee joint: Secondary | ICD-10-CM

## 2019-08-11 DIAGNOSIS — Z992 Dependence on renal dialysis: Secondary | ICD-10-CM

## 2019-08-11 DIAGNOSIS — R011 Cardiac murmur, unspecified: Secondary | ICD-10-CM

## 2019-08-11 LAB — GLUCOSE, CAPILLARY
Glucose-Capillary: 61 mg/dL — ABNORMAL LOW (ref 70–99)
Glucose-Capillary: 63 mg/dL — ABNORMAL LOW (ref 70–99)
Glucose-Capillary: 56 mg/dL — ABNORMAL LOW (ref 70–99)
Glucose-Capillary: 57 mg/dL — ABNORMAL LOW (ref 70–99)
Glucose-Capillary: 116 mg/dL — ABNORMAL HIGH (ref 70–99)
Glucose-Capillary: 238 mg/dL — ABNORMAL HIGH (ref 70–99)
Glucose-Capillary: 238 mg/dL — ABNORMAL HIGH (ref 70–99)
Glucose-Capillary: 287 mg/dL — ABNORMAL HIGH (ref 70–99)
Glucose-Capillary: 229 mg/dL — ABNORMAL HIGH (ref 70–99)
Glucose-Capillary: 214 mg/dL — ABNORMAL HIGH (ref 70–99)
Glucose-Capillary: 183 mg/dL — ABNORMAL HIGH (ref 70–99)

## 2019-08-11 LAB — CBC WITH DIFFERENTIAL/PLATELET
Abs Immature Granulocytes: 0.05 10*3/uL (ref 0.00–0.07)
Basophils Absolute: 0 10*3/uL (ref 0.0–0.1)
Basophils Relative: 0 %
Eosinophils Absolute: 0 10*3/uL (ref 0.0–0.5)
Eosinophils Relative: 0 %
HCT: 29.9 % — ABNORMAL LOW (ref 39.0–52.0)
Hemoglobin: 9.1 g/dL — ABNORMAL LOW (ref 13.0–17.0)
Immature Granulocytes: 1 %
Lymphocytes Relative: 11 %
Lymphs Abs: 0.5 10*3/uL — ABNORMAL LOW (ref 0.7–4.0)
MCH: 31.3 pg (ref 26.0–34.0)
MCHC: 30.4 g/dL (ref 30.0–36.0)
MCV: 102.7 fL — ABNORMAL HIGH (ref 80.0–100.0)
Monocytes Absolute: 0.6 10*3/uL (ref 0.1–1.0)
Monocytes Relative: 15 %
Neutro Abs: 2.9 10*3/uL (ref 1.7–7.7)
Neutrophils Relative %: 73 %
Platelets: 31 10*3/uL — ABNORMAL LOW (ref 150–400)
RBC: 2.91 MIL/uL — ABNORMAL LOW (ref 4.22–5.81)
RDW: 18.7 % — ABNORMAL HIGH (ref 11.5–15.5)
WBC: 4 10*3/uL (ref 4.0–10.5)
nRBC: 0 % (ref 0.0–0.2)

## 2019-08-11 LAB — BLOOD CULTURE ID PANEL (REFLEXED)

## 2019-08-11 LAB — BLOOD GAS, ARTERIAL
Acid-base deficit: 2.4 mmol/L — ABNORMAL HIGH (ref 0.0–2.0)
Bicarbonate: 21.5 mmol/L (ref 20.0–28.0)
Drawn by: 34719
FIO2: 32
O2 Saturation: 95.2 %
Patient temperature: 39.3
pCO2 arterial: 37.9 mmHg (ref 32.0–48.0)
pH, Arterial: 7.384 (ref 7.350–7.450)
pO2, Arterial: 96.8 mmHg (ref 83.0–108.0)

## 2019-08-11 LAB — PATHOLOGIST SMEAR REVIEW

## 2019-08-11 LAB — GLUCOSE, RANDOM: Glucose, Bld: 93 mg/dL (ref 70–99)

## 2019-08-11 MED ORDER — CALCIUM ACETATE (PHOS BINDER) 667 MG PO CAPS
667.0000 mg | ORAL_CAPSULE | Freq: Three times a day (TID) | ORAL | Status: DC
  Administered 2019-08-11 – 2019-08-12 (×4): 667 mg via ORAL
  Filled 2019-08-11 (×2): qty 1

## 2019-08-11 MED ORDER — METHYLPREDNISOLONE SODIUM SUCC 125 MG IJ SOLR
60.0000 mg | Freq: Every day | INTRAMUSCULAR | Status: AC
  Administered 2019-08-11: 60 mg via INTRAVENOUS
  Filled 2019-08-11: qty 2

## 2019-08-11 MED ORDER — RIFAMPIN 300 MG PO CAPS
300.0000 mg | ORAL_CAPSULE | Freq: Three times a day (TID) | ORAL | Status: DC
  Administered 2019-08-11 – 2019-08-12 (×4): 300 mg via ORAL
  Filled 2019-08-11 (×7): qty 1

## 2019-08-11 MED ORDER — DEXTROSE 10 % IV SOLN: INTRAVENOUS | Status: AC

## 2019-08-11 MED ORDER — RIFAMPIN 300 MG PO CAPS
600.0000 mg | ORAL_CAPSULE | Freq: Every day | ORAL | Status: DC
  Filled 2019-08-11: qty 2

## 2019-08-11 MED ORDER — DARBEPOETIN ALFA 60 MCG/0.3ML IJ SOSY
60.0000 ug | PREFILLED_SYRINGE | INTRAMUSCULAR | Status: DC
  Administered 2019-08-11: 60 ug via INTRAVENOUS

## 2019-08-11 MED ORDER — SODIUM CHLORIDE 0.9 % IV BOLUS
250.0000 mL | Freq: Once | INTRAVENOUS | Status: AC | PRN
  Administered 2019-08-11: 250 mL via INTRAVENOUS

## 2019-08-11 NOTE — Progress Notes (Signed)
PHARMACY - PHYSICIAN COMMUNICATION CRITICAL VALUE ALERT - BLOOD CULTURE IDENTIFICATION (BCID)  Shane [name] is an 86 y.o. male who presented to [facility] on 09/02/2019 with a chief complaint of shortness of breath/cough   Name of physician (or Provider) Contacted: Lang Snow ([facility])  Current antibiotics: Vancomycin/Cefepime  Changes to prescribed antibiotics recommended:  Cont current regimen   Results for orders placed or performed during the hospital encounter of 08/23/2019  Blood Culture ID Panel (Reflexed) (Collected: 08/10/2019  8:20 AM)  Result Value Ref Range   Enterococcus species NOT DETECTED NOT DETECTED   Listeria monocytogenes NOT DETECTED NOT DETECTED   Staphylococcus species DETECTED (A) NOT DETECTED   Staphylococcus aureus (BCID) DETECTED (A) NOT DETECTED   Methicillin resistance DETECTED (A) NOT DETECTED   Streptococcus species NOT DETECTED NOT DETECTED   Streptococcus agalactiae NOT DETECTED NOT DETECTED   Streptococcus pneumoniae NOT DETECTED NOT DETECTED   Streptococcus pyogenes NOT DETECTED NOT DETECTED   Acinetobacter baumannii NOT DETECTED NOT DETECTED   Enterobacteriaceae species NOT DETECTED NOT DETECTED   Enterobacter cloacae complex NOT DETECTED NOT DETECTED   Escherichia coli NOT DETECTED NOT DETECTED   Klebsiella oxytoca NOT DETECTED NOT DETECTED   Klebsiella pneumoniae NOT DETECTED NOT DETECTED   Proteus species NOT DETECTED NOT DETECTED   Serratia marcescens NOT DETECTED NOT DETECTED   Haemophilus influenzae NOT DETECTED NOT DETECTED   Neisseria meningitidis NOT DETECTED NOT DETECTED   Pseudomonas aeruginosa NOT DETECTED NOT DETECTED   Candida albicans NOT DETECTED NOT DETECTED   Candida glabrata NOT DETECTED NOT DETECTED   Candida krusei NOT DETECTED NOT DETECTED   Candida parapsilosis NOT DETECTED NOT DETECTED   Candida tropicalis NOT DETECTED NOT DETECTED    [name] 08/11/2019  1:02 AM

## 2019-08-11 NOTE — Progress Notes (Signed)
Physical Therapy Treatment Patient Details Name: Shane [name] MRN: [id number] DOB: 1934/05/27 Today's Date: 08/11/2019    History of Present Illness 86 yo admitted with SOB with CHF exacerbation. PMHx: ESRD on HD MWF, AICD, CHF, HTN, DM, HLD, CAD, BPH, PVD, gout, RT TKA    PT Comments    Patient seen for mobility progression. Pt in bed upon arrival and agreeable to participate. Pt is oriented to place and time but with confusion at times. Pt demonstrates poor motor planning and requires increased assistance for bed mobility and attempting functional transfer training than on initial PT evaluation. Pt unsafe to stand with one person at time of session. Pt assisted with bilat LE therex at bed level and ice reapplied to L knee. Pt with 7/10 pain L shoulder and knee. Continue to progress as tolerated.     Follow Up Recommendations  Home health PT(pt already active with HHPT)     Equipment Recommendations  None recommended by PT    Recommendations for Other Services OT consult     Precautions / Restrictions Precautions Precautions: Fall Precaution Comments: pt with left shoulder fx and rotator cuff tear awaiting sx on the 7th per pt Restrictions Weight Bearing Restrictions: No    Mobility  Bed Mobility Overal bed mobility: Needs Assistance Bed Mobility: Supine to Sit;Sit to Supine     Supine to sit: Mod assist;HOB elevated Sit to supine: Mod assist   General bed mobility comments: cues for sequencing; assist to elevate trunk into sitting; limited use of L UE and use of rail  Transfers Overall transfer level: Needs assistance Equipment used: Rolling walker (2 wheeled) Transfers: Sit to/from Stand           General transfer comment: attempted to stand however pt having difficutly with motor planning; unsafe to attempt again with one person assist  Ambulation/Gait                 Stairs             Wheelchair Mobility    Modified Rankin (Stroke  Patients Only)       Balance Overall balance assessment: History of Falls                                          Cognition Arousal/Alertness: Awake/alert Behavior During Therapy: WFL for tasks assessed/performed Overall Cognitive Status: Impaired/Different from baseline                                 General Comments: pt following single step commands with increased time and mulimodal cues (HOH) and oriented to place and time but with some confusion at times asking if we are "back in the hospital"       Exercises General Exercises - Lower Extremity Ankle Circles/Pumps: AROM;Both Quad Sets: AROM;Both Heel Slides: AAROM;Both Hip Flexion/Marching: AROM;Both;Seated    General Comments General comments (skin integrity, edema, etc.): VSS on 3L O2 via [place]      Pertinent Vitals/Pain Pain Assessment: 0-10 Pain Score: 7  Pain Location: L knee and L shoulder Pain Descriptors / Indicators: Grimacing;Guarding;Sore Pain Intervention(s): Limited activity within patient's tolerance;Monitored during session;Repositioned;Ice applied    Home Living                      Prior Function  PT Goals (current goals can now be found in the care plan section) Acute Rehab PT Goals Patient Stated Goal: drive Progress towards PT goals: Not progressing toward goals - comment    Frequency    Min 3X/week      PT Plan Current plan remains appropriate    Co-evaluation              AM-PAC PT "6 Clicks" Mobility   Outcome Measure  Help needed turning from your back to your side while in a flat bed without using bedrails?: A Lot Help needed moving from lying on your back to sitting on the side of a flat bed without using bedrails?: A Lot Help needed moving to and from a bed to a chair (including a wheelchair)?: A Lot Help needed standing up from a chair using your arms (e.g., wheelchair or bedside chair)?: A Lot Help needed to walk  in hospital room?: Total Help needed climbing 3-5 steps with a railing? : Total 6 Click Score: 10    End of Session Equipment Utilized During Treatment: Oxygen Activity Tolerance: Patient tolerated treatment well Patient left: with call bell/phone within reach;in bed;with bed alarm set Nurse Communication: Mobility status PT Visit Diagnosis: Other abnormalities of gait and mobility (R26.89);History of falling (Z91.81)     Time: 4709-6283 PT Time Calculation (min) (ACUTE ONLY): 24 min  Charges:  $Therapeutic Exercise: 8-22 mins $Therapeutic Activity: 8-22 mins                     Earney Navy, PTA Acute Rehabilitation Services Pager: [phone] Office: [phone]     Darliss Cheney 08/11/2019, 11:14 AM

## 2019-08-11 NOTE — Progress Notes (Signed)
ABI completed.  08/11/2019 3:50 PM Maudry Mayhew, MHA, RVT, RDCS, RDMS

## 2019-08-11 NOTE — Progress Notes (Signed)
Patient is alert and oriented Received orders to stop d10 and check blood sugar q30 x3, plan to continue pain management along with frequent vitals. Gave tylenol for pain Ice applied to Left knee for swelling. Dialysis and antibiotic treament.

## 2019-08-11 NOTE — Progress Notes (Signed)
RT obtained ABG on pt on 3LPM [place] with the following results. RT will continue to monitor.   Results for Shane [name], Shane [name] (MRN [id number]) as of 08/11/2019 00:19  Ref. Range 08/10/2019 23:43  Sample type Unknown ARTERIAL DRAW  FIO2 Unknown 32.00  pH, Arterial Latest Ref Range: 7.350 - 7.450  7.384  pCO2 arterial Latest Ref Range: 32.0 - 48.0 mmHg 37.9  pO2, Arterial Latest Ref Range: 83.0 - 108.0 mmHg 96.8  Acid-base deficit Latest Ref Range: 0.0 - 2.0 mmol/L 2.4 (H)  Bicarbonate Latest Ref Range: 20.0 - 28.0 mmol/L 21.5  O2 Saturation Latest Units: % 95.2  Patient temperature Unknown 39.3  Collection site Unknown LEFT RADIAL

## 2019-08-11 NOTE — Progress Notes (Addendum)
[facility] NEPHROLOGY PROGRESS NOTE  Assessment/ Plan: Pt is a 86 y.o. yo male with history of HTN, CAD status post CABG, ESRD on HD MWF, presented with shortness of breath/fluid overload.  Outpt HD: MWF [facility]  4h   400/800   91.5kg   2/2 bath  AVF R  Hep none  - venofer 50 / wk  - mircera 150 ug q2 wks  - calcitriol 1.0 ug tiw  #Acute on chronic respiratory failure with hypoxia due to fluid overload and possible infection.  Dialysis on 1/4 and 1/5 with ultrafiltration.  The breathing is better.  # ESRD MWF: Received serial dialysis for ultrafiltration.  Regular dialysis schedule today which can be done later if schedule allows.  His blood pressure is soft.  #Sepsis, MRSA bacteremia left knee joint infection; on cefepime and vancomycin.  Cefepime can probably be discontinued.  Defer to primary team.  # Anemia of CKD: Monitor hemoglobin.  Holding iron because of possible infection. Start Aranesp weekly.  # Secondary hyperparathyroidism: Phosphorus level, reduced PhosLo dose.  Continue calcitriol.  # HTN/volume: Noted blood pressure was low last night associated with fever probably due to infection.  Monitor BP.  Subjective: Seen and examined at bedside.  Overnight event noted.  He was febrile and blood pressure was low.  Today he denies nausea vomiting chest pain or shortness of breath.  Feels weak.  Objective Vital signs in last 24 hours: Vitals:   08/11/19 0544 08/11/19 0623 08/11/19 0631 08/11/19 0813  BP: (!) 86/57 (!) 100/54    Pulse: (!) 102     Resp: 20  18   Temp:   99.8 F (37.7 C)   TempSrc:   Rectal   SpO2: 95%   94%  Weight:       Weight change: 11.5 kg  Intake/Output Summary (Last 24 hours) at 08/11/2019 0859 Last data filed at 08/11/2019 0451 Gross per 24 hour  Intake 314.8 ml  Output 2500 ml  Net -2185.2 ml       Labs: Basic Metabolic Panel: Recent Labs  Lab 08/16/2019 2045 08/19/2019 2101 08/09/19 0120 08/09/19 0509 08/10/19 1610  08/11/19 0529  NA 135 141  --  139 136  --   K 4.2 4.2  --  4.6 3.9  --   CL 93* 97*  --  94* 96*  --   CO2 24  --   --  24 25  --   GLUCOSE 139* 135*  --  154* 98 93  BUN 41* 43*  --  48* 29*  --   CREATININE 5.93* 6.00* 6.30* 8.14* 4.26*  --   CALCIUM 9.0  --   --  9.1 8.8*  --   PHOS  --   --   --   --  2.5  --    Liver Function Tests: Recent Labs  Lab 09/04/2019 2045 08/10/19 1610  AST 54*  --   ALT 30  --   ALKPHOS 110  --   BILITOT 2.0*  --   PROT 7.2  --   ALBUMIN 3.1* 2.4*   No results for input(s): LIPASE, AMYLASE in the last 168 hours. No results for input(s): AMMONIA in the last 168 hours. CBC: Recent Labs  Lab 08/07/2019 2045 08/09/19 0120 08/09/19 0509 08/10/19 0031 08/10/19 0807 08/10/19 1610  WBC 4.2 3.1* 3.2* 4.5 4.9 5.3  NEUTROABS 2.6  --  2.7  --   --   --   HGB 10.0* 9.1* 8.8*  9.0* 8.9* 8.6*  HCT 34.1* 29.4* 28.9* 29.1* 29.0* 26.8*  MCV 105.2* 102.1* 102.8* 102.5* 100.7* 97.8  PLT 61* 48* 46* 46* 44* 49*   Cardiac Enzymes: No results for input(s): CKTOTAL, CKMB, CKMBINDEX, TROPONINI in the last 168 hours. CBG: Recent Labs  Lab 08/11/19 0150 08/11/19 0237 08/11/19 0331 08/11/19 0616 08/11/19 0836  GLUCAP 63* 56* 57* 116* 238*    Iron Studies:  Recent Labs    08/09/19 0120  FERRITIN 2,220*   Studies/Results: DG Chest 1 View  Result Date: 08/10/2019 CLINICAL DATA:  Dyspnea. EXAM: CHEST  1 VIEW COMPARISON:  August 08, 2018 FINDINGS: There is a multi lead AICD. Multiple sternal wires are seen. Persistently decreased lung volumes are seen with chronic appearing increased interstitial lung markings noted bilaterally. Mild persistent atelectasis is seen within the right lung base. There is no evidence of a pleural effusion or pneumothorax. The cardiac silhouette is moderately enlarged. Multilevel degenerative changes seen throughout the thoracic spine. IMPRESSION: 1. Persistently decreased lung volumes with chronic appearing increased interstitial  lung markings and right basilar atelectasis. 2. Stable cardiomegaly. Electronically Signed   By: Virgina Norfolk M.D.   On: 08/10/2019 23:42   DG Chest 1 View  Result Date: 08/09/2019 CLINICAL DATA:  Fever. Shortness of breath. EXAM: CHEST  1 VIEW COMPARISON:  August 08, 2019 FINDINGS: The heart size remains enlarged. The patient is status post prior median sternotomy. Multi lead left-sided pacemaker is noted. There is no pneumothorax. There is a persistent opacity at the right lung base which may represent a combination of a right-sided pleural effusion and atelectasis or infiltrate. There appears to be a small left-sided pleural effusion with adjacent atelectasis. IMPRESSION: 1. Low lung volumes with a persistent right basilar opacity which may represent a combination of atelectasis and a small right-sided pleural effusion. An infiltrate is difficult to entirely exclude. 2. Cardiomegaly. 3. Otherwise, stable appearance of the chest. Electronically Signed   By: Constance Holster M.D.   On: 08/09/2019 21:19   ECHOCARDIOGRAM COMPLETE  Result Date: 08/09/2019   ECHOCARDIOGRAM REPORT   Patient Name:   Shane [name] Date of Exam: 08/09/2019 Medical Rec #:  [id number]        Height:       69.0 in Accession #:    [id number]       Weight:       200.6 lb Date of Birth:  1933/12/02         BSA:          2.07 m Patient Age:    60 years         BP:           103/58 mmHg Patient Gender: M                HR:           88 bpm. Exam Location:  Inpatient Procedure: 2D Echo Indications:    CHF 428  History:        Patient has prior history of Echocardiogram examinations, most                 recent 01/01/2018. CHF, CAD; Risk Factors:Hypertension, Diabetes                 and Dyslipidemia. AICD.  Sonographer:    Jannett Celestine RDCS (AE) Referring Phys: [place]  Sonographer Comments: limited mobility (left shoulder issue)...supine exam. off axis parasternal window IMPRESSIONS  1. Left ventricular  ejection fraction,  by visual estimation, is 40 to 45%. The left ventricle has mildly decreased function. There is no left ventricular hypertrophy.  2. The left ventricle has no regional wall motion abnormalities.  3. Global right ventricle has severely reduced systolic function.The right ventricular size is normal. No increase in right ventricular wall thickness.  4. Left atrial size was mildly dilated.  5. Right atrial size was mildly dilated.  6. The mitral valve is grossly normal. Mild mitral valve regurgitation.  7. The tricuspid valve is grossly normal.  8. The aortic valve is tricuspid. Aortic valve regurgitation is not visualized. Mild aortic valve sclerosis without stenosis.  9. The pulmonic valve was normal in structure. Pulmonic valve regurgitation is not visualized. 10. Mildly elevated pulmonary artery systolic pressure. 11. The atrial septum is grossly normal. FINDINGS  Left Ventricle: Left ventricular ejection fraction, by visual estimation, is 40 to 45%. The left ventricle has mildly decreased function. The left ventricle has no regional wall motion abnormalities. There is no left ventricular hypertrophy. Left ventricular diastolic parameters were normal. Right Ventricle: The right ventricular size is normal. No increase in right ventricular wall thickness. Global RV systolic function is has severely reduced systolic function. The tricuspid regurgitant velocity is 2.37 m/s, and with an assumed right atrial pressure of 10 mmHg, the estimated right ventricular systolic pressure is mildly elevated at 32.5 mmHg. Left Atrium: Left atrial size was mildly dilated. Right Atrium: Right atrial size was mildly dilated Pericardium: There is no evidence of pericardial effusion. Mitral Valve: The mitral valve is grossly normal. Mild mitral valve regurgitation. Tricuspid Valve: The tricuspid valve is grossly normal. Tricuspid valve regurgitation moderate. Aortic Valve: The aortic valve is tricuspid. . There is moderate thickening and  moderate calcification of the aortic valve. Aortic valve regurgitation is not visualized. Mild aortic valve sclerosis is present, with no evidence of aortic valve stenosis. Moderate aortic valve annular calcification. There is moderate thickening of the aortic valve. There is moderate calcification of the aortic valve. Aortic valve mean gradient measures 11.0 mmHg. Aortic valve peak gradient measures 17.3 mmHg. Aortic valve  area, by VTI measures 2.46 cm. Pulmonic Valve: The pulmonic valve was normal in structure. Pulmonic valve regurgitation is not visualized. Pulmonic regurgitation is not visualized. Aorta: The aortic root and ascending aorta are structurally normal, with no evidence of dilitation. IAS/Shunts: The atrial septum is grossly normal.  LEFT VENTRICLE PLAX 2D LVIDd:         5.12 cm  Diastology LVIDs:         3.78 cm  LV e' lateral:   12.50 cm/s LV PW:         1.37 cm  LV E/e' lateral: 8.2 LV IVS:        0.96 cm  LV e' medial:    8.05 cm/s LVOT diam:     2.50 cm  LV E/e' medial:  12.7 LV SV:         64 ml LV SV Index:   29.96 LVOT Area:     4.91 cm  RIGHT VENTRICLE TAPSE (M-mode): 0.9 cm LEFT ATRIUM             Index       RIGHT ATRIUM           Index LA diam:        5.20 cm 2.51 cm/m  RA Area:     23.40 cm LA Vol (A2C):   77.6 ml 37.51 ml/m RA Volume:  68.40 ml  33.06 ml/m LA Vol (A4C):   66.1 ml 31.95 ml/m LA Biplane Vol: 73.1 ml 35.34 ml/m  AORTIC VALVE AV Area (Vmax):    2.48 cm AV Area (Vmean):   2.39 cm AV Area (VTI):     2.46 cm AV Vmax:           208.00 cm/s AV Vmean:          157.000 cm/s AV VTI:            0.390 m AV Peak Grad:      17.3 mmHg AV Mean Grad:      11.0 mmHg LVOT Vmax:         105.00 cm/s LVOT Vmean:        76.400 cm/s LVOT VTI:          0.196 m LVOT/AV VTI ratio: 0.50  AORTA Ao Root diam: 3.20 cm MITRAL VALVE                         TRICUSPID VALVE MV Area (PHT): 4.49 cm              TR Peak grad:   22.5 mmHg MV PHT:        49.01 msec            TR Vmax:        266.00  cm/s MV Decel Time: 169 msec MR Peak grad:    72.9 mmHg           SHUNTS MR Mean grad:    42.0 mmHg           Systemic VTI:  0.20 m MR Vmax:         427.00 cm/s         Systemic Diam: 2.50 cm MR Vmean:        301.0 cm/s MR PISA:         2.26 cm MR PISA Eff ROA: 15 mm MR PISA Radius:  0.60 cm MV E velocity: 102.00 cm/s 103 cm/s MV A velocity: 64.30 cm/s  70.3 cm/s MV E/A ratio:  1.59        1.5  Mertie Moores MD Electronically signed by Mertie Moores MD Signature Date/Time: 08/09/2019/4:47:06 PM    Final     Medications: Infusions: . sodium chloride    . sodium chloride    . sodium chloride    . ceFEPime (MAXIPIME) IV 1 g (08/10/19 1456)  . vancomycin      Scheduled Medications: . allopurinol  100 mg Oral Daily  . amiodarone  200 mg Oral Daily  . aspirin EC  81 mg Oral QHS  . atorvastatin  80 mg Oral QPM  . calcitRIOL  0.25 mcg Oral Q M,W,F-HD  . calcium acetate  1,334 mg Oral TID WC  . Chlorhexidine Gluconate Cloth  6 each Topical Q0600  . clopidogrel  75 mg Oral Daily  . fluticasone furoate-vilanterol  1 puff Inhalation Daily  . multivitamin  1 tablet Oral QHS  . mupirocin ointment  1 application Topical BID  . pantoprazole  40 mg Oral Daily  . sodium chloride flush  3 mL Intravenous Q12H    have reviewed scheduled and prn medications.  Physical Exam: General:NAD, comfortable Heart:RRR, s1s2 nl Lungs: Bibasal coarse breath sound, no increased work of breathing Abdomen:soft, Non-tender, non-distended Extremities:Trace LE edema Dialysis Access: Right AV fistula.  Cross Jorge Prasad Erlinda Solinger 08/11/2019,8:59 AM  LOS: 3 days  Pager: [phone]

## 2019-08-11 NOTE — Progress Notes (Signed)
PROGRESS NOTE    ROLLO [name]   GXQ:[id number]  DOB: 06/02/1934  DOA: 08/20/2019 PCP: [name], MD   Brief Narrative:  Shane [name] 86 year old married male, lives with spouse and granddaughter, ambulates with the help of walker, PMH of ESRD on MWF HD, chronic systolic CHF, last LVEF 14-48% in 2019, s/p AICD, HTN, DM 2, HLD, CAD, chronic hypoxic respiratory failure on home oxygen 2 L/min at night, OSA reportedly cannot tolerate CPAP, frequent falls (6-7 times in the last 6 weeks), reported left shoulder fracture and right little toe fracture following a fall, presented to [facility] ED on 1/3 due to progressive dyspnea, hypoxia, lower extremity edema despite compliance with his outpatient HD.  Admitted for acute on chronic hypoxic respiratory failure due to decompensated CHF in a dialysis patient.  Nephrology consulted, s/p HD on 1/4.  Course complicated by febrile illness, suspected pneumonia, acute left knee arthritis (orthopedic consulted).   Subjective: Having pain in left knee.  No other complaints    Assessment & Plan:   Principal Problem:   MRSA bacteremia -Fever started on 1/4 and continues -Blood cultures reveal MRSA on BCID -Chest x-ray on 1/4 revealed> a right basilar opacity which may represent atelectasis and small right-sided pleural effusion-an infiltrate cannot be excluded -The patient's left knee was aspirated on 1/5 due to pain and swelling-Gram stain shows few gram-positive cocci-uric acid crystals also noted-the knee was aspirated a second time to help with treatment 80 cc of blood was removed -The patient was started on IV steroids, vancomycin and cefepime on 1/5 -ID consulted-recommended to continue vancomycin, DC cefepime, repeat blood cultures, obtain TEE -Cardiology has performed a preop eval in case a procedure for his knee is needed-he is a high risk and it is recommended that a nerve block or spinal anesthesia be used  Active Problems:  Acute on  chronic hypoxic respiratory failure-suspected lobar pneumonia - cough with dark sputum -?  If hypoxia secondary to acute heart failure as well-continue antibiotics per ID  Acute on chronic combined systolic and diastolic heart failure-ischemic cardiomyopathy -2D echo reveals an EF of 40 to 45%, no LVH or regional wall motion abnormalities -Fluid management with dialysis  Hypoglycemia- DM2 - on Glucotrol XL at home  Frequent falls- left shoulder pain - 6-7 falls in past 6 wks - injured his left shoulder and right small toe - CT left shoulder 06/25/2019: Full-thickness retracted rotator cuff tear, chronic ununited acromial fracture versus os acromial.  H/o VT/AICD and 1 episode of A-fib - cont Amiodarone    ESRD (end stage renal disease) - dialyzes Monday Wednesday Friday -Appreciate nephrology follow-up  Anemia of chronic disease -Started on Aranesp weekly  Secondary Hyperparathyroidism - Phoslo dose decreased - cont Calcitriol   DM (diabetes mellitus), type 2 with renal complications  - sugars dropped to 20-30s overnight- he was started on D10 infusion at 50 cc/hr- after eating today, his sugars were in 200s- I have asked to stop D10 and follow glucose levels - ISS d/c'd for now   Severe obstructive sleep apnea  - does not tolerate CPAP- cont 2 L O2 at night    ICD (implantable cardioverter-defibrillator)    PAD -  PTA and covered stent of L common iliac artery done on 02/05/19 by Dr Gwenlyn Found. - cont ASA, Plavix and statins - ABIs pending  Thrombocytopenia - low platelets date back to 2009- records in [place] do not go further back - currently platelets < 50- possibly due to sepsis-  no acute bleeding noted other than into left knee after the arthrocentesis yesterday      Time spent in minutes: 35 DVT prophylaxis: SCDs Code Status: Full code Family Communication:  Disposition Plan: cont to treat infection Consultants:   Cardiology  Nephrology  Ortho  ID  Procedures:   Echocardiogram 08/09/2019 IMPRESSIONS 1. Left ventricular ejection fraction, by visual estimation, is 40 to 45%. The left ventricle has mildly decreased function. There is no left ventricular hypertrophy. 2. The left ventricle has no regional wall motion abnormalities. 3. Global right ventricle has severely reduced systolic function.The right ventricular size is normal. No increase in right ventricular wall thickness. 4. Left atrial size was mildly dilated. 5. Right atrial size was mildly dilated. 6. The mitral valve is grossly normal. Mild mitral valve regurgitation. 7. The tricuspid valve is grossly normal. 8. The aortic valve is tricuspid. Aortic valve regurgitation is not visualized. Mild aortic valve sclerosis without stenosis. 9. The pulmonic valve was normal in structure. Pulmonic valve regurgitation is not visualized. 10. Mildly elevated pulmonary artery systolic pressure.  11. The atrial septum is grossly normal.  Antimicrobials:  Anti-infectives (From admission, onward)   Start     Dose/Rate Route Frequency Ordered Stop   08/11/19 1200  vancomycin (VANCOCIN) IVPB 1000 mg/200 mL premix     1,000 mg 200 mL/hr over 60 Minutes Intravenous Every M-W-F (Hemodialysis) 08/10/19 0732     08/10/19 1000  ceFEPIme (MAXIPIME) 1 g in sodium chloride 0.9 % 100 mL IVPB  Status:  Discontinued     1 g 200 mL/hr over 30 Minutes Intravenous Daily 08/10/19 0732 08/11/19 1133   08/10/19 0830  vancomycin (VANCOREADY) IVPB 1750 mg/350 mL     1,750 mg 175 mL/hr over 120 Minutes Intravenous  Once 08/10/19 0732 08/10/19 1117       Objective: Vitals:   08/11/19 0631 08/11/19 0813 08/11/19 0900 08/11/19 1100  BP:   111/63 112/66  Pulse:   99 98  Resp: 18   18  Temp: 99.8 F (37.7 C)     TempSrc: Rectal     SpO2:  94%  96%  Weight:        Intake/Output Summary (Last 24 hours) at 08/11/2019 1242 Last data filed at 08/11/2019 0900 Gross per 24 hour  Intake 334.8 ml   Output 2500 ml  Net -2165.2 ml   Filed Weights   08/10/19 1150 08/10/19 1357 08/11/19 0542  Weight: 107 kg 104 kg 102 kg    Examination: General exam: Appears comfortable  HEENT: PERRLA, oral mucosa moist, no sclera icterus or thrush Respiratory system: mild rhonchi- Respiratory effort normal. Cardiovascular system: S1 & S2 heard, RRR.   Gastrointestinal system: Abdomen soft, non-tender, nondistended. Normal bowel sounds. Central nervous system: Alert and oriented. No focal neurological deficits. Extremities: No cyanosis, clubbing- mild pedal edema- left knee moderately tender with mild swelling Skin: No rashes or ulcers Psychiatry:  Mood & affect appropriate.     Data Reviewed: I have personally reviewed following labs and imaging studies  CBC: Recent Labs  Lab 08/16/2019 2045 09/01/2019 2101 08/09/19 0509 08/10/19 0031 08/10/19 0807 08/10/19 1610 08/11/19 0529  WBC 4.2   < > 3.2* 4.5 4.9 5.3 4.0  NEUTROABS 2.6  --  2.7  --   --   --  2.9  HGB 10.0*  --  8.8* 9.0* 8.9* 8.6* 9.1*  HCT 34.1*  --  28.9* 29.1* 29.0* 26.8* 29.9*  MCV 105.2*   < > 102.8* 102.5* 100.7*  97.8 102.7*  PLT 61*   < > 46* 46* 44* 49* 31*   < > = values in this interval not displayed.   Basic Metabolic Panel: Recent Labs  Lab 08/19/2019 2045 09/03/2019 2101 08/09/19 0120 08/09/19 0509 08/10/19 1610 08/11/19 0529  NA 135 141  --  139 136  --   K 4.2 4.2  --  4.6 3.9  --   CL 93* 97*  --  94* 96*  --   CO2 24  --   --  24 25  --   GLUCOSE 139* 135*  --  154* 98 93  BUN 41* 43*  --  48* 29*  --   CREATININE 5.93* 6.00* 6.30* 8.14* 4.26*  --   CALCIUM 9.0  --   --  9.1 8.8*  --   PHOS  --   --   --   --  2.5  --    GFR: Estimated Creatinine Clearance: 15.2 mL/min (A) (by C-G formula based on SCr of 4.26 mg/dL (H)). Liver Function Tests: Recent Labs  Lab 08/17/2019 2045 08/10/19 1610  AST 54*  --   ALT 30  --   ALKPHOS 110  --   BILITOT 2.0*  --   PROT 7.2  --   ALBUMIN 3.1* 2.4*   No  results for input(s): LIPASE, AMYLASE in the last 168 hours. No results for input(s): AMMONIA in the last 168 hours. Coagulation Profile: No results for input(s): INR, PROTIME in the last 168 hours. Cardiac Enzymes: No results for input(s): CKTOTAL, CKMB, CKMBINDEX, TROPONINI in the last 168 hours. BNP (last 3 results) Recent Labs    12/29/18 1428  PROBNP 2,417.0*   HbA1C: Recent Labs    08/09/19 0120  HGBA1C 4.8   CBG: Recent Labs  Lab 08/11/19 0616 08/11/19 0836 08/11/19 0934 08/11/19 1017 08/11/19 1109  GLUCAP 116* 238* 287* 238* 229*   Lipid Profile: Recent Labs    08/22/2019 2045  TRIG 131   Thyroid Function Tests: No results for input(s): TSH, T4TOTAL, FREET4, T3FREE, THYROIDAB in the last 72 hours. Anemia Panel: Recent Labs    08/09/19 0120  FERRITIN 2,220*   Urine analysis:    Component Value Date/Time   COLORURINE AMBER (A) 12/16/2017 0346   APPEARANCEUR HAZY (A) 12/16/2017 0346   LABSPEC 1.025 12/16/2017 0346   PHURINE 6.0 12/16/2017 0346   GLUCOSEU NEGATIVE 12/16/2017 0346   GLUCOSEU NEGATIVE 06/29/2012 1218   HGBUR MODERATE (A) 12/16/2017 0346   BILIRUBINUR SMALL (A) 12/16/2017 0346   KETONESUR NEGATIVE 12/16/2017 0346   PROTEINUR >300 (A) 12/16/2017 0346   UROBILINOGEN 0.2 10/05/2014 2249   NITRITE NEGATIVE 12/16/2017 0346   LEUKOCYTESUR NEGATIVE 12/16/2017 0346   Sepsis Labs: @LABRCNTIP (procalcitonin:4,lacticidven:4) ) Recent Results (from the past 240 hour(s))  Blood Culture (routine x 2)     Status: None (Preliminary result)   Collection Time: 08/09/2019  8:50 PM   Specimen: BLOOD LEFT FOREARM  Result Value Ref Range Status   Specimen Description BLOOD LEFT FOREARM  Final   Special Requests   Final    BOTTLES DRAWN AEROBIC AND ANAEROBIC Blood Culture adequate volume   Culture   Final    NO GROWTH 2 DAYS Performed at [facility], [place] [address], [place], [place] [address]    Report Status PENDING  Incomplete  Respiratory  Panel by RT PCR (Flu A&B, Covid) - Nasopharyngeal Swab     Status: None   Collection Time: 08/14/2019 10:11 PM  Specimen: Nasopharyngeal Swab  Result Value Ref Range Status   SARS Coronavirus 2 by RT PCR NEGATIVE NEGATIVE Final    Comment: (NOTE) SARS-CoV-2 target nucleic acids are NOT DETECTED. The SARS-CoV-2 RNA is generally detectable in upper respiratoy specimens during the acute phase of infection. The lowest concentration of SARS-CoV-2 viral copies this assay can detect is 131 copies/mL. A negative result does not preclude SARS-Cov-2 infection and should not be used as the sole basis for treatment or other patient management decisions. A negative result may occur with  improper specimen collection/handling, submission of specimen other than nasopharyngeal swab, presence of viral mutation(s) within the areas targeted by this assay, and inadequate number of viral copies (<131 copies/mL). A negative result must be combined with clinical observations, patient history, and epidemiological information. The expected result is Negative. Fact Sheet for Patients:  PinkCheek.be Fact Sheet for Healthcare Providers:  GravelBags.it This test is not yet ap proved or cleared by the [place] FDA and  has been authorized for detection and/or diagnosis of SARS-CoV-2 by FDA under an Emergency Use Authorization (EUA). This EUA will remain  in effect (meaning this test can be used) for the duration of the COVID-19 declaration under Section 564(b)(1) of the Act, 21 U.S.C. section 360bbb-3(b)(1), unless the authorization is terminated or revoked sooner.    Influenza A by PCR NEGATIVE NEGATIVE Final   Influenza B by PCR NEGATIVE NEGATIVE Final    Comment: (NOTE) The Xpert Xpress SARS-CoV-2/FLU/RSV assay is intended as an aid in  the diagnosis of influenza from Nasopharyngeal swab specimens and  should not be used as a sole basis for  treatment. Nasal washings and  aspirates are unacceptable for Xpert Xpress SARS-CoV-2/FLU/RSV  testing. Fact Sheet for Patients: PinkCheek.be Fact Sheet for Healthcare Providers: GravelBags.it This test is not yet approved or cleared by the [place] FDA and  has been authorized for detection and/or diagnosis of SARS-CoV-2 by  FDA under an Emergency Use Authorization (EUA). This EUA will remain  in effect (meaning this test can be used) for the duration of the  Covid-19 declaration under Section 564(b)(1) of the Act, 21  U.S.C. section 360bbb-3(b)(1), unless the authorization is  terminated or revoked. Performed at [facility], [place] [address], [place], [place] [address]   Blood Culture (routine x 2)     Status: None (Preliminary result)   Collection Time: 08/09/19  1:20 AM   Specimen: BLOOD RIGHT HAND  Result Value Ref Range Status   Specimen Description BLOOD RIGHT HAND  Final   Special Requests   Final    BOTTLES DRAWN AEROBIC AND ANAEROBIC Blood Culture adequate volume   Culture   Final    NO GROWTH 1 DAY Performed at [facility], [place] [address] [place], [place] [address]    Report Status PENDING  Incomplete  Culture, blood (routine x 2)     Status: None (Preliminary result)   Collection Time: 08/10/19  8:15 AM   Specimen: BLOOD  Result Value Ref Range Status   Specimen Description BLOOD LEFT ANTECUBITAL  Final   Special Requests   Final    BOTTLES DRAWN AEROBIC AND ANAEROBIC Blood Culture adequate volume   Culture  Setup Time   Final    IN BOTH AEROBIC AND ANAEROBIC BOTTLES GRAM POSITIVE COCCI CRITICAL RESULT CALLED TO, READ BACK BY AND VERIFIED WITH: A MEYER PHARMD 08/10/19 2047 JDW    Culture   Final    CULTURE REINCUBATED FOR BETTER  GROWTH Performed at [facility], [place] [address], [place], [place] [address]    Report Status PENDING  Incomplete  Culture, blood (routine x 2)     Status:  Abnormal (Preliminary result)   Collection Time: 08/10/19  8:20 AM   Specimen: BLOOD  Result Value Ref Range Status   Specimen Description BLOOD LEFT ANTECUBITAL  Final   Special Requests   Final    BOTTLES DRAWN AEROBIC ONLY Blood Culture adequate volume   Culture  Setup Time   Final    AEROBIC BOTTLE ONLY GRAM POSITIVE COCCI CRITICAL RESULT CALLED TO, READ BACK BY AND VERIFIED WITH: J LEDFORD PHARMD 08/11/19 0000 JDW    Culture (A)  Final    STAPHYLOCOCCUS AUREUS CULTURE REINCUBATED FOR BETTER GROWTH Performed at [facility], [place] [address], [place], [place] [address]    Report Status PENDING  Incomplete  Blood Culture ID Panel (Reflexed)     Status: Abnormal   Collection Time: 08/10/19  8:20 AM  Result Value Ref Range Status   Enterococcus species NOT DETECTED NOT DETECTED Final   Listeria monocytogenes NOT DETECTED NOT DETECTED Final   Staphylococcus species DETECTED (A) NOT DETECTED Final    Comment: CRITICAL RESULT CALLED TO, READ BACK BY AND VERIFIED WITH: J LEDFORD PHARMD 08/11/19 0000 JDW    Staphylococcus aureus (BCID) DETECTED (A) NOT DETECTED Final    Comment: Methicillin (oxacillin)-resistant Staphylococcus aureus (MRSA). MRSA is predictably resistant to beta-lactam antibiotics (except ceftaroline). Preferred therapy is vancomycin unless clinically contraindicated. Patient requires contact precautions if  hospitalized. CRITICAL RESULT CALLED TO, READ BACK BY AND VERIFIED WITH: J LEDFORD PHARMD 08/11/19 0000 JDW    Methicillin resistance DETECTED (A) NOT DETECTED Final    Comment: CRITICAL RESULT CALLED TO, READ BACK BY AND VERIFIED WITH: J LEDFORD PHARMD 08/11/19 0000 JDW    Streptococcus species NOT DETECTED NOT DETECTED Final   Streptococcus agalactiae NOT DETECTED NOT DETECTED Final   Streptococcus pneumoniae NOT DETECTED NOT DETECTED Final   Streptococcus pyogenes NOT DETECTED NOT DETECTED Final   Acinetobacter baumannii NOT DETECTED NOT DETECTED Final    Enterobacteriaceae species NOT DETECTED NOT DETECTED Final   Enterobacter cloacae complex NOT DETECTED NOT DETECTED Final   Escherichia coli NOT DETECTED NOT DETECTED Final   Klebsiella oxytoca NOT DETECTED NOT DETECTED Final   Klebsiella pneumoniae NOT DETECTED NOT DETECTED Final   Proteus species NOT DETECTED NOT DETECTED Final   Serratia marcescens NOT DETECTED NOT DETECTED Final   Haemophilus influenzae NOT DETECTED NOT DETECTED Final   Neisseria meningitidis NOT DETECTED NOT DETECTED Final   Pseudomonas aeruginosa NOT DETECTED NOT DETECTED Final   Candida albicans NOT DETECTED NOT DETECTED Final   Candida glabrata NOT DETECTED NOT DETECTED Final   Candida krusei NOT DETECTED NOT DETECTED Final   Candida parapsilosis NOT DETECTED NOT DETECTED Final   Candida tropicalis NOT DETECTED NOT DETECTED Final    Comment: Performed at [facility], [place]. [address], [place], [place] [address]  Stat Gram stain     Status: None   Collection Time: 08/10/19  3:37 PM   Specimen: Body Fluid  Result Value Ref Range Status   Specimen Description FLUID SYNOVIAL LEFT KNEE  Final   Special Requests NONE  Final   Gram Stain   Final    ABUNDANT WBC PRESENT,BOTH PMN AND MONONUCLEAR RARE GRAM POSITIVE COCCI Performed at [facility], [place] [address], [place], [place] [address]    Report Status  08/10/2019 FINAL  Final  MRSA PCR Screening     Status: None   Collection Time: 08/10/19  5:03 PM   Specimen: Nasopharyngeal  Result Value Ref Range Status   MRSA by PCR NEGATIVE NEGATIVE Final    Comment:        The GeneXpert MRSA Assay (FDA approved for NASAL specimens only), is one component of a comprehensive MRSA colonization surveillance program. It is not intended to diagnose MRSA infection nor to guide or monitor treatment for MRSA infections. Performed at [facility], [place] [address] [place], [place] [address]          Radiology Studies: DG Chest 1 View  Result Date:  08/10/2019 CLINICAL DATA:  Dyspnea. EXAM: CHEST  1 VIEW COMPARISON:  August 08, 2018 FINDINGS: There is a multi lead AICD. Multiple sternal wires are seen. Persistently decreased lung volumes are seen with chronic appearing increased interstitial lung markings noted bilaterally. Mild persistent atelectasis is seen within the right lung base. There is no evidence of a pleural effusion or pneumothorax. The cardiac silhouette is moderately enlarged. Multilevel degenerative changes seen throughout the thoracic spine. IMPRESSION: 1. Persistently decreased lung volumes with chronic appearing increased interstitial lung markings and right basilar atelectasis. 2. Stable cardiomegaly. Electronically Signed   By: Virgina Norfolk M.D.   On: 08/10/2019 23:42   DG Chest 1 View  Result Date: 08/09/2019 CLINICAL DATA:  Fever. Shortness of breath. EXAM: CHEST  1 VIEW COMPARISON:  August 08, 2019 FINDINGS: The heart size remains enlarged. The patient is status post prior median sternotomy. Multi lead left-sided pacemaker is noted. There is no pneumothorax. There is a persistent opacity at the right lung base which may represent a combination of a right-sided pleural effusion and atelectasis or infiltrate. There appears to be a small left-sided pleural effusion with adjacent atelectasis. IMPRESSION: 1. Low lung volumes with a persistent right basilar opacity which may represent a combination of atelectasis and a small right-sided pleural effusion. An infiltrate is difficult to entirely exclude. 2. Cardiomegaly. 3. Otherwise, stable appearance of the chest. Electronically Signed   By: Constance Holster M.D.   On: 08/09/2019 21:19   ECHOCARDIOGRAM COMPLETE  Result Date: 08/09/2019   ECHOCARDIOGRAM REPORT   Patient Name:   Shane [name] Date of Exam: 08/09/2019 Medical Rec #:  [id number]        Height:       69.0 in Accession #:    [id number]       Weight:       200.6 lb Date of Birth:  [birth year]         BSA:          2.07 m  Patient Age:    44 years         BP:           103/58 mmHg Patient Gender: M                HR:           88 bpm. Exam Location:  Inpatient Procedure: 2D Echo Indications:    CHF 428  History:        Patient has prior history of Echocardiogram examinations, most                 recent 01/01/2018. CHF, CAD; Risk Factors:Hypertension, Diabetes                 and Dyslipidemia. AICD.  Sonographer:  Jannett Celestine RDCS (AE) Referring Phys: [place]  Sonographer Comments: limited mobility (left shoulder issue)...supine exam. off axis parasternal window IMPRESSIONS  1. Left ventricular ejection fraction, by visual estimation, is 40 to 45%. The left ventricle has mildly decreased function. There is no left ventricular hypertrophy.  2. The left ventricle has no regional wall motion abnormalities.  3. Global right ventricle has severely reduced systolic function.The right ventricular size is normal. No increase in right ventricular wall thickness.  4. Left atrial size was mildly dilated.  5. Right atrial size was mildly dilated.  6. The mitral valve is grossly normal. Mild mitral valve regurgitation.  7. The tricuspid valve is grossly normal.  8. The aortic valve is tricuspid. Aortic valve regurgitation is not visualized. Mild aortic valve sclerosis without stenosis.  9. The pulmonic valve was normal in structure. Pulmonic valve regurgitation is not visualized. 10. Mildly elevated pulmonary artery systolic pressure. 11. The atrial septum is grossly normal. FINDINGS  Left Ventricle: Left ventricular ejection fraction, by visual estimation, is 40 to 45%. The left ventricle has mildly decreased function. The left ventricle has no regional wall motion abnormalities. There is no left ventricular hypertrophy. Left ventricular diastolic parameters were normal. Right Ventricle: The right ventricular size is normal. No increase in right ventricular wall thickness. Global RV systolic function is has severely reduced  systolic function. The tricuspid regurgitant velocity is 2.37 m/s, and with an assumed right atrial pressure of 10 mmHg, the estimated right ventricular systolic pressure is mildly elevated at 32.5 mmHg. Left Atrium: Left atrial size was mildly dilated. Right Atrium: Right atrial size was mildly dilated Pericardium: There is no evidence of pericardial effusion. Mitral Valve: The mitral valve is grossly normal. Mild mitral valve regurgitation. Tricuspid Valve: The tricuspid valve is grossly normal. Tricuspid valve regurgitation moderate. Aortic Valve: The aortic valve is tricuspid. . There is moderate thickening and moderate calcification of the aortic valve. Aortic valve regurgitation is not visualized. Mild aortic valve sclerosis is present, with no evidence of aortic valve stenosis. Moderate aortic valve annular calcification. There is moderate thickening of the aortic valve. There is moderate calcification of the aortic valve. Aortic valve mean gradient measures 11.0 mmHg. Aortic valve peak gradient measures 17.3 mmHg. Aortic valve  area, by VTI measures 2.46 cm. Pulmonic Valve: The pulmonic valve was normal in structure. Pulmonic valve regurgitation is not visualized. Pulmonic regurgitation is not visualized. Aorta: The aortic root and ascending aorta are structurally normal, with no evidence of dilitation. IAS/Shunts: The atrial septum is grossly normal.  LEFT VENTRICLE PLAX 2D LVIDd:         5.12 cm  Diastology LVIDs:         3.78 cm  LV e' lateral:   12.50 cm/s LV PW:         1.37 cm  LV E/e' lateral: 8.2 LV IVS:        0.96 cm  LV e' medial:    8.05 cm/s LVOT diam:     2.50 cm  LV E/e' medial:  12.7 LV SV:         64 ml LV SV Index:   29.96 LVOT Area:     4.91 cm  RIGHT VENTRICLE TAPSE (M-mode): 0.9 cm LEFT ATRIUM             Index       RIGHT ATRIUM           Index LA diam:  5.20 cm 2.51 cm/m  RA Area:     23.40 cm LA Vol (A2C):   77.6 ml 37.51 ml/m RA Volume:   68.40 ml  33.06 ml/m LA Vol  (A4C):   66.1 ml 31.95 ml/m LA Biplane Vol: 73.1 ml 35.34 ml/m  AORTIC VALVE AV Area (Vmax):    2.48 cm AV Area (Vmean):   2.39 cm AV Area (VTI):     2.46 cm AV Vmax:           208.00 cm/s AV Vmean:          157.000 cm/s AV VTI:            0.390 m AV Peak Grad:      17.3 mmHg AV Mean Grad:      11.0 mmHg LVOT Vmax:         105.00 cm/s LVOT Vmean:        76.400 cm/s LVOT VTI:          0.196 m LVOT/AV VTI ratio: 0.50  AORTA Ao Root diam: 3.20 cm MITRAL VALVE                         TRICUSPID VALVE MV Area (PHT): 4.49 cm              TR Peak grad:   22.5 mmHg MV PHT:        49.01 msec            TR Vmax:        266.00 cm/s MV Decel Time: 169 msec MR Peak grad:    72.9 mmHg           SHUNTS MR Mean grad:    42.0 mmHg           Systemic VTI:  0.20 m MR Vmax:         427.00 cm/s         Systemic Diam: 2.50 cm MR Vmean:        301.0 cm/s MR PISA:         2.26 cm MR PISA Eff ROA: 15 mm MR PISA Radius:  0.60 cm MV E velocity: 102.00 cm/s 103 cm/s MV A velocity: 64.30 cm/s  70.3 cm/s MV E/A ratio:  1.59        1.5  [place] Moores MD Electronically signed by Mertie Moores MD Signature Date/Time: 08/09/2019/4:47:06 PM    Final       Scheduled Meds: . allopurinol  100 mg Oral Daily  . amiodarone  200 mg Oral Daily  . aspirin EC  81 mg Oral QHS  . atorvastatin  80 mg Oral QPM  . calcitRIOL  0.25 mcg Oral Q M,W,F-HD  . calcium acetate  667 mg Oral TID WC  . Chlorhexidine Gluconate Cloth  6 each Topical Q0600  . clopidogrel  75 mg Oral Daily  . darbepoetin (ARANESP) injection - DIALYSIS  60 mcg Intravenous Q Wed-HD  . fluticasone furoate-vilanterol  1 puff Inhalation Daily  . multivitamin  1 tablet Oral QHS  . mupirocin ointment  1 application Topical BID  . pantoprazole  40 mg Oral Daily  . sodium chloride flush  3 mL Intravenous Q12H   Continuous Infusions: . sodium chloride    . sodium chloride    . sodium chloride    . vancomycin       LOS: 3 days      Debbe Odea, MD [facility]  Pager: [email or web address] Password [facility] 08/11/2019, 12:42 PM
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
## 2019-08-11 NOTE — Progress Notes (Signed)
OT Cancellation Note  Patient Details Name: Shane [name] MRN: [id number] DOB: August 19, 1933   Cancelled Treatment:     Spoke with patient's RN states patient is having too much pain after knee aspiration and does not want to get out of bed. Request to please hold therapy today. Will re-attempt 1/7 as appropriate.   [place] OT office: [place] 08/11/2019, 10:53 AM

## 2019-08-11 NOTE — Consult Note (Signed)
[name] for Infectious Disease    Date of Admission:  08/20/2019           Day 2 vancomycin        Day 2 cefepime       Reason for Consult: Automatic consultation for MRSA bacteremia     Assessment: He has MRSA bacteremia complicated by septic arthritis of his left knee.  I am also concerned about the possibility of infection of his left shoulder (superimposed upon previously known rotator cuff tear and chronic nonunion of acromial fracture).  No evidence for endocarditis given his AICD.  I do not see any evidence of infection of his fistula.  Plan: 1. Continue vancomycin 2. Discontinue cefepime 3. Repeat blood cultures 4. Recommend TEE 5. Await decision by orthopedics about surgery  Principal Problem:   MRSA bacteremia Active Problems:   Septic arthritis ([place])   Hypercholesterolemia   DM (diabetes mellitus), type 2 with renal complications ([place])   Benign hypertensive heart disease without heart failure   Severe obstructive sleep apnea   Chronic combined systolic and diastolic CHF (congestive heart failure) ([place])   ICD (implantable cardioverter-defibrillator) discharge   ESRD (end stage renal disease) (HCC)   GERD (gastroesophageal reflux disease)   Acute exacerbation of CHF (congestive heart failure) (H[facility])   Scheduled Meds: . allopurinol  100 mg Oral Daily  . amiodarone  200 mg Oral Daily  . aspirin EC  81 mg Oral QHS  . atorvastatin  80 mg Oral QPM  . calcitRIOL  0.25 mcg Oral Q M,W,F-HD  . calcium acetate  667 mg Oral TID WC  . Chlorhexidine Gluconate Cloth  6 each Topical Q0600  . clopidogrel  75 mg Oral Daily  . darbepoetin (ARANESP) injection - DIALYSIS  60 mcg Intravenous Q Wed-HD  . fluticasone furoate-vilanterol  1 puff Inhalation Daily  . multivitamin  1 tablet Oral QHS  . mupirocin ointment  1 application Topical BID  . pantoprazole  40 mg Oral Daily  . sodium chloride flush  3 mL Intravenous Q12H   Continuous Infusions: . sodium  chloride    . sodium chloride    . sodium chloride    . ceFEPime (MAXIPIME) IV 1 g (08/10/19 1456)  . vancomycin     PRN Meds:.sodium chloride, sodium chloride, sodium chloride, acetaminophen, albuterol, alteplase, calcium carbonate, fentaNYL (SUBLIMAZE) injection, heparin, lidocaine (PF), lidocaine-prilocaine, ondansetron (ZOFRAN) IV, pentafluoroprop-tetrafluoroeth, polyethylene glycol, sodium chloride flush  HPI: Shane [name] is a 86 y.o. male problems including heart failure, diabetes, and end-stage renal disease on hemodialysis who was admitted 3 days ago with a several month history of worsening cough and shortness of breath.  He was afebrile upon admission but began to have fevers the following day associated with rigors.  Blood cultures were obtained therapy.  It was also recognized that he was having acute on chronic swelling of his left knee left shoulder arthrocentesis of his left knee revealed 159,500 white blood cells of which 93% were segmented neutrophils.  Acute gout crystals were noted Gram stain also showed an positive cocci.  Admission blood culture here growing MRSA.   Review of Systems: Review of Systems  Constitutional: Positive for chills and fever. Negative for diaphoresis.  Respiratory: Positive for cough and shortness of breath.   Cardiovascular: Negative for chest pain.  Gastrointestinal: Negative for abdominal pain, diarrhea, nausea and vomiting.  Genitourinary: Negative for dysuria.  Musculoskeletal: Positive for joint pain.    Past  Medical History:  Diagnosis Date  . Abnormal nuclear cardiac imaging test Nov 2010   moderate area of infarct in the inferior wall with only minimal reversibility and EF of 28%  . AICD (automatic cardioverter/defibrillator) present   . Allergic rhinitis 01-08-13   Uses nebulizer for chronic sinus issues and Mucinex.  . Arthritis    osteoarthritis   . Asthma    Extrinic  . Blood transfusion    ? at time of bypass surgery   .  BPH (benign prostatic hyperplasia)   . Cellulitis of arm, left    MSSA  . CHF (congestive heart failure) ([place])   . Colon polyps    adenomatous  . Coronary artery disease    remote CABG in 1985, cath in 2003 by Dr. Lia Foyer with no PCI, last nuclear in 2010 showing scar and EF of 28%.   . Diabetes mellitus   . Diabetic neuropathy ([place]) 04/25/2017  . Dyslipidemia   . Dyspnea   . ESRD (end stage renal disease) ([place])   . Falls frequently   . Gait abnormality 04/25/2017  . Glaucoma 01-08-13   tx. eye drops  . Hemodialysis patient ([place])   . HOH (hard of hearing)   . HTN (hypertension)   . Hypercholesterolemia   . Left ventricular dysfunction    28% per nuclear in 2010 and 35 to 40% per echo in 2010  . Myocardial infarction ([place])    1985  . Neuromuscular disorder ([place])    legs mild paralysis-able to walk"nerve damage"-legs- left leg brace   . Neuropathy   . Pneumonia    hx of several times years ago   . PVD (peripheral vascular disease) ([place])    a. s/p PTA/stenting of L CIA 02/2019.  Marland Kitchen Renal artery stenosis (HCC)    a. moderate bilateral by angio 02/2019.  Marland Kitchen Spinal stenosis     Social History   Tobacco Use  . Smoking status: Former Smoker    Packs/day: 1.00    Years: 10.00    Pack years: 10.00    Types: Cigarettes, Pipe, Cigars    Start date: 12/02/1949    Quit date: 08/05/1958    Years since quitting: 61.0  . Smokeless tobacco: Never Used  Substance Use Topics  . Alcohol use: No  . Drug use: No    Family History  Problem Relation Age of Onset  . Heart attack Father   . Heart disease Father   . Colon polyps Father   . Lung cancer Mother   . Diabetes Sister        x 2  . Heart disease Brother        x 2  . Bone cancer Brother   . Lung disease Neg Hx    Allergies  Allergen Reactions  . Codeine Other (See Comments)    anxiety  . Dacarbazine Other (See Comments)  . Hydrocodone Other (See Comments)    Anxiety- can take in liquid form  . Pseudoephedrine Other (See  Comments)    Causes heart to race  . Azithromycin Rash    OBJECTIVE: Blood pressure 111/63, pulse 99, temperature 99.8 F (37.7 C), temperature source Rectal, resp. rate 18, weight 102 kg, SpO2 94 %.  Physical Exam Constitutional:      Comments: He is pleasant and in no distress.  He is resting quietly in bed watching television.  He is hard of hearing.  Cardiovascular:     Rate and Rhythm: Regular rhythm. Tachycardia present.  Heart sounds: Murmur present.     Comments: He has a 1/6 to 2/6 systolic murmur. Pulmonary:     Effort: Pulmonary effort is normal.     Breath sounds: Normal breath sounds.  Chest:    Abdominal:     Palpations: Abdomen is soft.     Tenderness: There is no abdominal tenderness.  Musculoskeletal:        General: Swelling and tenderness present.     Comments: He has an Ace wrap and ice pack on his left knee.  He has had previous right knee replacement without any noted inflammation there.  Has diffuse swelling of his left shoulder.  It is slightly warm but erythematous.  Skin:    Findings: No rash.     Comments: No obvious abnormalities noted of his right upper arm AV fistula.  Neurological:     General: No focal deficit present.  Psychiatric:        Mood and Affect: Mood normal.     Lab Results Lab Results  Component Value Date   WBC 4.0 08/11/2019   HGB 9.1 (L) 08/11/2019   HCT 29.9 (L) 08/11/2019   MCV 102.7 (H) 08/11/2019   PLT 31 (L) 08/11/2019    Lab Results  Component Value Date   CREATININE 4.26 (H) 08/10/2019   BUN 29 (H) 08/10/2019   NA 136 08/10/2019   K 3.9 08/10/2019   CL 96 (L) 08/10/2019   CO2 25 08/10/2019    Lab Results  Component Value Date   ALT 30 08/26/2019   AST 54 (H)    ALKPHOS 110 09/04/2019   BILITOT 2.0 (H) 08/20/2019     Microbiology: Recent Results (from the past 240 hour(s))  Blood Culture (routine x 2)     Status: None (Preliminary result)   Collection Time: 09/05/2019  8:50 PM    Specimen: BLOOD LEFT FOREARM  Result Value Ref Range Status   Specimen Description BLOOD LEFT FOREARM  Final   Special Requests   Final    BOTTLES DRAWN AEROBIC AND ANAEROBIC Blood Culture adequate volume   Culture   Final    NO GROWTH 2 DAYS Performed at [facility], [place] [address] [place], [place] [address]    Report Status PENDING  Incomplete  Respiratory Panel by RT PCR (Flu A&B, Covid) - Nasopharyngeal Swab     Status: None   Collection Time: 08/18/2019 10:11 PM   Specimen: Nasopharyngeal Swab  Result Value Ref Range Status   SARS Coronavirus 2 by RT PCR NEGATIVE NEGATIVE Final    Comment: (NOTE) SARS-CoV-2 target nucleic acids are NOT DETECTED. The SARS-CoV-2 RNA is generally detectable in upper respiratoy specimens during the acute phase of infection. The lowest concentration of SARS-CoV-2 viral copies this assay can detect is 131 copies/mL. A negative result does not preclude SARS-Cov-2 infection and should not be used as the sole basis for treatment or other patient management decisions. A negative result may occur with  improper specimen collection/handling, submission of specimen other than nasopharyngeal swab, presence of viral mutation(s) within the areas targeted by this assay, and inadequate number of viral copies (<131 copies/mL). A negative result must be combined with clinical observations, patient history, and epidemiological information. The expected result is Negative. Fact Sheet for Patients:  PinkCheek.be Fact Sheet for Healthcare Providers:  GravelBags.it This test is not yet ap proved or cleared by the [place] FDA and  has been authorized for detection and/or diagnosis of SARS-CoV-2 by FDA under  an Emergency Use Authorization (EUA). This EUA will remain  in effect (meaning this test can be used) for the duration of the COVID-19 declaration under Section 564(b)(1) of the Act, 21  U.S.C. section 360bbb-3(b)(1), unless the authorization is terminated or revoked sooner.    Influenza A by PCR NEGATIVE NEGATIVE Final   Influenza B by PCR NEGATIVE NEGATIVE Final    Comment: (NOTE) The Xpert Xpress SARS-CoV-2/FLU/RSV assay is intended as an aid in  the diagnosis of influenza from Nasopharyngeal swab specimens and  should not be used as a sole basis for treatment. Nasal washings and  aspirates are unacceptable for Xpert Xpress SARS-CoV-2/FLU/RSV  testing. Fact Sheet for Patients: PinkCheek.be Fact Sheet for Healthcare Providers: GravelBags.it This test is not yet approved or cleared by the [place] FDA and  has been authorized for detection and/or diagnosis of SARS-CoV-2 by  FDA under an Emergency Use Authorization (EUA). This EUA will remain  in effect (meaning this test can be used) for the duration of the  Covid-19 declaration under Section 564(b)(1) of the Act, 21  U.S.C. section 360bbb-3(b)(1), unless the authorization is  terminated or revoked. Performed at [facility], [place] [address] [place], [place] [address]   Blood Culture (routine x 2)     Status: None (Preliminary result)   Collection Time: 08/09/19  1:20 AM   Specimen: BLOOD RIGHT HAND  Result Value Ref Range Status   Specimen Description BLOOD RIGHT HAND  Final   Special Requests   Final    BOTTLES DRAWN AEROBIC AND ANAEROBIC Blood Culture adequate volume   Culture   Final    NO GROWTH 1 DAY Performed at [facility], [place] [address] [place], [place] [address]    Report Status PENDING  Incomplete  Culture, blood (routine x 2)     Status: None (Preliminary result)   Collection Time: 08/10/19  8:15 AM   Specimen: BLOOD  Result Value Ref Range Status   Specimen Description BLOOD LEFT ANTECUBITAL  Final   Special Requests   Final    BOTTLES DRAWN AEROBIC AND ANAEROBIC Blood Culture adequate volume   Culture  Setup Time    Final    IN BOTH AEROBIC AND ANAEROBIC BOTTLES GRAM POSITIVE COCCI CRITICAL RESULT CALLED TO, READ BACK BY AND VERIFIED WITH: A MEYER PHARMD 08/10/19 2047 JDW    Culture   Final    CULTURE REINCUBATED FOR BETTER GROWTH Performed at [facility], [place] [address] [place], [place] [address]    Report Status PENDING  Incomplete  Culture, blood (routine x 2)     Status: Abnormal (Preliminary result)   Collection Time: 08/10/19  8:20 AM   Specimen: BLOOD  Result Value Ref Range Status   Specimen Description BLOOD LEFT ANTECUBITAL  Final   Special Requests   Final    BOTTLES DRAWN AEROBIC ONLY Blood Culture adequate volume   Culture  Setup Time   Final    AEROBIC BOTTLE ONLY GRAM POSITIVE COCCI CRITICAL RESULT CALLED TO, READ BACK BY AND VERIFIED WITH: J LEDFORD PHARMD 08/11/19 0000 JDW    Culture (A)  Final    STAPHYLOCOCCUS AUREUS CULTURE REINCUBATED FOR BETTER GROWTH Performed at [facility], [place] [address] [place], [place] [address]    Report Status PENDING  Incomplete  Blood Culture ID Panel (Reflexed)     Status: Abnormal   Collection Time: 08/10/19  8:20 AM  Result Value Ref Range Status  Enterococcus species NOT DETECTED NOT DETECTED Final   Listeria monocytogenes NOT DETECTED NOT DETECTED Final   Staphylococcus species DETECTED (A) NOT DETECTED Final    Comment: CRITICAL RESULT CALLED TO, READ BACK BY AND VERIFIED WITH: J LEDFORD PHARMD 08/11/19 0000 JDW    Staphylococcus aureus (BCID) DETECTED (A) NOT DETECTED Final    Comment: Methicillin (oxacillin)-resistant Staphylococcus aureus (MRSA). MRSA is predictably resistant to beta-lactam antibiotics (except ceftaroline). Preferred therapy is vancomycin unless clinically contraindicated. Patient requires contact precautions if  hospitalized. CRITICAL RESULT CALLED TO, READ BACK BY AND VERIFIED WITH: J LEDFORD PHARMD 08/11/19 0000 JDW    Methicillin resistance DETECTED (A) NOT DETECTED Final    Comment: CRITICAL RESULT  CALLED TO, READ BACK BY AND VERIFIED WITH: J LEDFORD PHARMD 08/11/19 0000 JDW    Streptococcus species NOT DETECTED NOT DETECTED Final   Streptococcus agalactiae NOT DETECTED NOT DETECTED Final   Streptococcus pneumoniae NOT DETECTED NOT DETECTED Final   Streptococcus pyogenes NOT DETECTED NOT DETECTED Final   Acinetobacter baumannii NOT DETECTED NOT DETECTED Final   Enterobacteriaceae species NOT DETECTED NOT DETECTED Final   Enterobacter cloacae complex NOT DETECTED NOT DETECTED Final   Escherichia coli NOT DETECTED NOT DETECTED Final   Klebsiella oxytoca NOT DETECTED NOT DETECTED Final   Klebsiella pneumoniae NOT DETECTED NOT DETECTED Final   Proteus species NOT DETECTED NOT DETECTED Final   Serratia marcescens NOT DETECTED NOT DETECTED Final   Haemophilus influenzae NOT DETECTED NOT DETECTED Final   Neisseria meningitidis NOT DETECTED NOT DETECTED Final   Pseudomonas aeruginosa NOT DETECTED NOT DETECTED Final   Candida albicans NOT DETECTED NOT DETECTED Final   Candida glabrata NOT DETECTED NOT DETECTED Final   Candida krusei NOT DETECTED NOT DETECTED Final   Candida parapsilosis NOT DETECTED NOT DETECTED Final   Candida tropicalis NOT DETECTED NOT DETECTED Final    Comment: Performed at [facility], [place]. [address] [place], [place] [address]  Stat Gram stain     Status: None   Collection Time: 08/10/19  3:37 PM   Specimen: Body Fluid  Result Value Ref Range Status   Specimen Description FLUID SYNOVIAL LEFT KNEE  Final   Special Requests NONE  Final   Gram Stain   Final    ABUNDANT WBC PRESENT,BOTH PMN AND MONONUCLEAR RARE GRAM POSITIVE COCCI Performed at [facility], [place] [address] [place], [place] [address]    Report Status 08/10/2019 FINAL  Final  MRSA PCR Screening     Status: None   Collection Time: 08/10/19  5:03 PM   Specimen: Nasopharyngeal  Result Value Ref Range Status   MRSA by PCR NEGATIVE NEGATIVE Final    Comment:        The GeneXpert MRSA  Assay (FDA approved for NASAL specimens only), is one component of a comprehensive MRSA colonization surveillance program. It is not intended to diagnose MRSA infection nor to guide or monitor treatment for MRSA infections. Performed at [facility], [place] [address], [place], [place] [address]     Michel Bickers, [facility] [phone] pager   [phone] cell 08/11/2019, 11:16 AM

## 2019-08-12 ENCOUNTER — Ambulatory Visit: Payer: Medicare Other | Admitting: Podiatry

## 2019-08-12 DIAGNOSIS — M25412 Effusion, left shoulder: Secondary | ICD-10-CM

## 2019-08-12 LAB — GLUCOSE, CAPILLARY
Glucose-Capillary: 109 mg/dL — ABNORMAL HIGH (ref 70–99)
Glucose-Capillary: 105 mg/dL — ABNORMAL HIGH (ref 70–99)
Glucose-Capillary: 126 mg/dL — ABNORMAL HIGH (ref 70–99)

## 2019-08-12 LAB — RENAL FUNCTION PANEL
Albumin: 2.6 g/dL — ABNORMAL LOW (ref 3.5–5.0)
Anion gap: 19 — ABNORMAL HIGH (ref 5–15)
BUN: 40 mg/dL — ABNORMAL HIGH (ref 8–23)
CO2: 23 mmol/L (ref 22–32)
Calcium: 8.8 mg/dL — ABNORMAL LOW (ref 8.9–10.3)
Chloride: 96 mmol/L — ABNORMAL LOW (ref 98–111)
Creatinine, Ser: 4.57 mg/dL — ABNORMAL HIGH (ref 0.61–1.24)
GFR calc Af Amer: 13 mL/min — ABNORMAL LOW (ref >60)
GFR calc non Af Amer: 11 mL/min — ABNORMAL LOW (ref >60)
Glucose, Bld: 132 mg/dL — ABNORMAL HIGH (ref 70–99)
Phosphorus: 4.3 mg/dL (ref 2.5–4.6)
Potassium: 5.2 mmol/L — ABNORMAL HIGH (ref 3.5–5.1)
Sodium: 138 mmol/L (ref 135–145)

## 2019-08-12 LAB — CBC
HCT: 29.6 % — ABNORMAL LOW (ref 39.0–52.0)
Hemoglobin: 9.2 g/dL — ABNORMAL LOW (ref 13.0–17.0)
MCH: 30.9 pg (ref 26.0–34.0)
MCHC: 31.1 g/dL (ref 30.0–36.0)
MCV: 99.3 fL (ref 80.0–100.0)
Platelets: 34 10*3/uL — ABNORMAL LOW (ref 150–400)
RBC: 2.98 MIL/uL — ABNORMAL LOW (ref 4.22–5.81)
RDW: 18.3 % — ABNORMAL HIGH (ref 11.5–15.5)
WBC: 6.1 10*3/uL (ref 4.0–10.5)
nRBC: 0.7 % — ABNORMAL HIGH (ref 0.0–0.2)

## 2019-08-12 MED ORDER — SODIUM ZIRCONIUM CYCLOSILICATE 10 G PO PACK
10.0000 g | PACK | Freq: Once | ORAL | Status: AC
  Administered 2019-08-12: 10 g via ORAL
  Filled 2019-08-12: qty 1

## 2019-08-12 MED ORDER — CHLORHEXIDINE GLUCONATE CLOTH 2 % EX PADS: 6.0000 | MEDICATED_PAD | Freq: Every day | CUTANEOUS | Status: DC

## 2019-08-12 NOTE — Progress Notes (Signed)
PROGRESS NOTE    Shane [name]   YJE:[id number]  DOB: 08-31-1933  DOA: 09/03/2019 PCP: [name], MD   Brief Narrative:  Patient is an 86 year old married male, lives with spouse and granddaughter, ambulates with the help of walker, with past medical history significant for ESRD on HD MWF, chronic combined systolic and diastolic CHF, with echocardiogram done in 2019 revealing an estimated Estimated LVEF 40-45%; s/p AICD, HTN, DM 2, HLD, CAD, chronic hypoxic respiratory failure on home oxygen 2 L/min at night, OSA reportedly cannot tolerate CPAP, frequent falls (6-7 times in the last 6 weeks), reported left shoulder fracture and right little toe fracture following a fall, presented to [facility] ED on 1/3 due to progressive dyspnea, hypoxia, lower extremity edema despite compliance with his outpatient HD.  Admitted for acute on chronic hypoxic respiratory failure due to decompensated CHF in a dialysis patient.  Nephrology consulted, s/p HD on 1/4.    Hospital course has been complicated by febrile illness.  Work-up was revealed MRSA bacteremia and left knee septic arthritis.  Septic arthritis of the left shoulder cannot be excluded.  Input from the orthopedic team and infectious disease team is highly appreciated.   08/12/2019: Patient seen.  Discussed with the orthopedic team.  Orthopedic team plans to proceed with left knee washout tomorrow.  Patient is currently on IV vancomycin.  Otherwise, no new complaints.  Subjective: Patient continues to report pain involving left knee.  Assessment & Plan:   Principal Problem: MRSA bacteremia/septic arthritis: -Fever started on 1/4 and continues -Blood cultures reveal MRSA on BCID -Chest x-ray on 1/4 revealed> a right basilar opacity which may represent atelectasis and small right-sided pleural effusion-an infiltrate cannot be excluded -The patient's left knee was aspirated on 1/5 due to pain and swelling-Gram stain shows few gram-positive cocci-uric  acid crystals also noted-the knee was aspirated a second time to help with treatment 80 cc of blood was removed -The patient was started on IV steroids, vancomycin and cefepime on 1/5 -ID consulted-recommended to continue vancomycin, DC cefepime, repeat blood cultures, obtain TEE -Cardiology has performed a preop eval in case a procedure for his knee is needed-he is a high risk and it is recommended that a nerve block or spinal anesthesia be used 08/12/2019: Sample taken from the left knee joint is also growing Staph aureus.  Orthopedic team plans to proceed with washout tomorrow at 2 PM.  Septic arthritis involving the left shoulder joint cannot be excluded.  Continue IV vancomycin.  Infectious disease input is appreciated.  Acute on chronic hypoxic respiratory failure:  -Suspect this is secondary to pulmonary edema/volume overload -Chest x-ray done on admission revealed: Probable congestive heart failure. Cardiomegaly. Venous hypertension and interstitial edema. Poor inspiration with basilar volume loss. -Respiratory symptoms have improved significantly. -Continue hemodialysis Monday, Wednesday and Friday.  Acute on chronic combined systolic and diastolic heart failure/ischemic cardiomyopathy/pulmonary edema: -2D echo reveals an EF of 40 to 45%, no LVH or regional wall motion abnormalities -Fluid management with dialysis 08/12/2019: Continue hemodialysis.  Hypoglycemia- DM2 - on Glucotrol XL at home 08/12/2019: Resolved.  Glucotrol XL has been discontinued.  Frequent falls- left shoulder pain - 6-7 falls in past 6 wks - injured his left shoulder and right small toe - CT left shoulder 06/25/2019: Full-thickness retracted rotator cuff tear, chronic ununited acromial fracture versus os acromial.  H/o VT/AICD and 1 episode of A-fib - cont Amiodarone    ESRD (end stage renal disease) - dialyzes Monday Wednesday Friday -We will  defer management to the nephrology team.   Anemia of chronic  disease -Started on Aranesp weekly -08/12/2019: Defer management to the nephrology team.  Secondary Hyperparathyroidism - Phoslo dose decreased - cont Calcitriol -08/12/2019: Defer management to the nephrology team.   DM (diabetes mellitus), type 2 with renal complications  - sugars dropped to 20-30s overnight- he was started on D10 infusion at 50 cc/hr- after eating today, his sugars were in 200s- I have asked to stop D10 and follow glucose levels - ISS d/c'd for now 08/12/2019: Patient is not on insulin regimen at the moment.   Severe obstructive sleep apnea  - does not tolerate CPAP- cont 2 L O2 at night    ICD (implantable cardioverter-defibrillator)    PAD -  PTA and covered stent of L common iliac artery done on 02/05/19 by Dr Gwenlyn Found. - cont ASA, Plavix and statins - ABIs pending  Thrombocytopenia - low platelets date back to 2009- records in [place] do not go further back - currently platelets < 50- possibly due to sepsis- no acute bleeding noted other than into left knee after the arthrocentesis yesterday      Time spent in minutes: 35 DVT prophylaxis: SCDs Code Status: Full code Family Communication:  Disposition Plan: cont to treat infection Consultants:   Cardiology  Nephrology  Ortho  ID Procedures:   Echocardiogram 08/09/2019 IMPRESSIONS 1. Left ventricular ejection fraction, by visual estimation, is 40 to 45%. The left ventricle has mildly decreased function. There is no left ventricular hypertrophy. 2. The left ventricle has no regional wall motion abnormalities. 3. Global right ventricle has severely reduced systolic function.The right ventricular size is normal. No increase in right ventricular wall thickness. 4. Left atrial size was mildly dilated. 5. Right atrial size was mildly dilated. 6. The mitral valve is grossly normal. Mild mitral valve regurgitation. 7. The tricuspid valve is grossly normal. 8. The aortic valve is tricuspid. Aortic valve  regurgitation is not visualized. Mild aortic valve sclerosis without stenosis. 9. The pulmonic valve was normal in structure. Pulmonic valve regurgitation is not visualized. 10. Mildly elevated pulmonary artery systolic pressure.  11. The atrial septum is grossly normal.  Antimicrobials:  Anti-infectives (From admission, onward)   Start     Dose/Rate Route Frequency Ordered Stop   08/11/19 1800  rifampin (RIFADIN) capsule 600 mg  Status:  Discontinued     600 mg Oral Daily 08/11/19 1340 08/11/19 1343   08/11/19 1400  rifampin (RIFADIN) capsule 300 mg     300 mg Oral Every 8 hours 08/11/19 1343     08/11/19 1200  vancomycin (VANCOCIN) IVPB 1000 mg/200 mL premix     1,000 mg 200 mL/hr over 60 Minutes Intravenous Every M-W-F (Hemodialysis) 08/10/19 0732     08/10/19 1000  ceFEPIme (MAXIPIME) 1 g in sodium chloride 0.9 % 100 mL IVPB  Status:  Discontinued     1 g 200 mL/hr over 30 Minutes Intravenous Daily 08/10/19 0732 08/11/19 1133   08/10/19 0830  vancomycin (VANCOREADY) IVPB 1750 mg/350 mL     1,750 mg 175 mL/hr over 120 Minutes Intravenous  Once 08/10/19 0732 08/10/19 1117       Objective: Vitals:   08/12/19 0051 08/12/19 0100 08/12/19 0536 08/12/19 0734  BP: (!) 101/58  101/61   Pulse: 99  60   Resp: 20  20   Temp: 98 F (36.7 C)  (!) 97.4 F (36.3 C)   TempSrc: Oral  Oral   SpO2: 98%  97% 98%  Weight:  99 kg      Intake/Output Summary (Last 24 hours) at 08/12/2019 1125 Last data filed at 08/12/2019 1100 Gross per 24 hour  Intake 700 ml  Output 2000 ml  Net -1300 ml   Filed Weights   08/11/19 0542 08/11/19 1942 08/12/19 0100  Weight: 102 kg 101 kg 99 kg    Examination: General exam: Appears comfortable  HEENT: PERRLA, oral mucosa moist, no sclera icterus or thrush Respiratory system: mild rhonchi- Respiratory effort normal. Cardiovascular system: S1 & S2 with ejection systolic murmur.     Gastrointestinal system: Abdomen is obese, soft and nontender.  Organs  are difficult to assess.  Central nervous system: Alert and oriented.  Patient moves all extremities.   Extremities: Edema of upper extremities.  Right upper extremity AV fistula, aneurysmal.  Left knee swollen, warm and tender with movement.    Data Reviewed: I have personally reviewed following labs and imaging studies  CBC: Recent Labs  Lab 08/24/2019 2045 08/23/2019 2101 08/09/19 0509 08/10/19 0031 08/10/19 0807 08/10/19 1610 08/11/19 0529  WBC 4.2   < > 3.2* 4.5 4.9 5.3 4.0  NEUTROABS 2.6  --  2.7  --   --   --  2.9  HGB 10.0*  --  8.8* 9.0* 8.9* 8.6* 9.1*  HCT 34.1*  --  28.9* 29.1* 29.0* 26.8* 29.9*  MCV 105.2*   < > 102.8* 102.5* 100.7* 97.8 102.7*  PLT 61*   < > 46* 46* 44* 49* 31*   < > = values in this interval not displayed.   Basic Metabolic Panel: Recent Labs  Lab 08/07/2019 2045 08/10/2019 2101 08/09/19 0120 08/09/19 0509 08/10/19 1610 08/11/19 0529  NA 135 141  --  139 136  --   K 4.2 4.2  --  4.6 3.9  --   CL 93* 97*  --  94* 96*  --   CO2 24  --   --  24 25  --   GLUCOSE 139* 135*  --  154* 98 93  BUN 41* 43*  --  48* 29*  --   CREATININE 5.93* 6.00* 6.30* 8.14* 4.26*  --   CALCIUM 9.0  --   --  9.1 8.8*  --   PHOS  --   --   --   --  2.5  --    GFR: Estimated Creatinine Clearance: 15 mL/min (A) (by C-G formula based on SCr of 4.26 mg/dL (H)). Liver Function Tests: Recent Labs  Lab 08/15/2019 2045 08/10/19 1610  AST 54*  --   ALT 30  --   ALKPHOS 110  --   BILITOT 2.0*  --   PROT 7.2  --   ALBUMIN 3.1* 2.4*   No results for input(s): LIPASE, AMYLASE in the last 168 hours. No results for input(s): AMMONIA in the last 168 hours. Coagulation Profile: No results for input(s): INR, PROTIME in the last 168 hours. Cardiac Enzymes: No results for input(s): CKTOTAL, CKMB, CKMBINDEX, TROPONINI in the last 168 hours. BNP (last 3 results) Recent Labs    12/29/18 1428  PROBNP 2,417.0*   HbA1C: No results for input(s): HGBA1C in the last 72  hours. CBG: Recent Labs  Lab 08/11/19 1017 08/11/19 1109 08/11/19 1156 08/11/19 1631 08/12/19 0242  GLUCAP 238* 229* 214* 183* 109*   Lipid Profile: No results for input(s): CHOL, HDL, LDLCALC, TRIG, CHOLHDL, LDLDIRECT in the last 72 hours. Thyroid Function Tests: No results for input(s): TSH, T4TOTAL, FREET4, T3FREE, THYROIDAB in  the last 72 hours. Anemia Panel: No results for input(s): VITAMINB12, FOLATE, FERRITIN, TIBC, IRON, RETICCTPCT in the last 72 hours. Urine analysis:    Component Value Date/Time   COLORURINE AMBER (A) 12/16/2017 0346   APPEARANCEUR HAZY (A) 12/16/2017 0346   LABSPEC 1.025 12/16/2017 0346   PHURINE 6.0 12/16/2017 0346   GLUCOSEU NEGATIVE 12/16/2017 0346   GLUCOSEU NEGATIVE 06/29/2012 1218   HGBUR MODERATE (A) 12/16/2017 0346   BILIRUBINUR SMALL (A) 12/16/2017 0346   KETONESUR NEGATIVE 12/16/2017 0346   PROTEINUR >300 (A) 12/16/2017 0346   UROBILINOGEN 0.2 10/05/2014 2249   NITRITE NEGATIVE 12/16/2017 0346   LEUKOCYTESUR NEGATIVE 12/16/2017 0346   Sepsis Labs: @LABRCNTIP (procalcitonin:4,lacticidven:4) ) Recent Results (from the past 240 hour(s))  Blood Culture (routine x 2)     Status: None (Preliminary result)   Collection Time: 08/23/2019  8:50 PM   Specimen: BLOOD LEFT FOREARM  Result Value Ref Range Status   Specimen Description BLOOD LEFT FOREARM  Final   Special Requests   Final    BOTTLES DRAWN AEROBIC AND ANAEROBIC Blood Culture adequate volume   Culture   Final    NO GROWTH 3 DAYS Performed at [facility], [place] [address], [place], [place] [address]    Report Status PENDING  Incomplete  Respiratory Panel by RT PCR (Flu A&B, Covid) - Nasopharyngeal Swab     Status: None   Collection Time: 08/24/2019 10:11 PM   Specimen: Nasopharyngeal Swab  Result Value Ref Range Status   SARS Coronavirus 2 by RT PCR NEGATIVE NEGATIVE Final    Comment: (NOTE) SARS-CoV-2 target nucleic acids are NOT DETECTED. The SARS-CoV-2 RNA is generally  detectable in upper respiratoy specimens during the acute phase of infection. The lowest concentration of SARS-CoV-2 viral copies this assay can detect is 131 copies/mL. A negative result does not preclude SARS-Cov-2 infection and should not be used as the sole basis for treatment or other patient management decisions. A negative result may occur with  improper specimen collection/handling, submission of specimen other than nasopharyngeal swab, presence of viral mutation(s) within the areas targeted by this assay, and inadequate number of viral copies (<131 copies/mL). A negative result must be combined with clinical observations, patient history, and epidemiological information. The expected result is Negative. Fact Sheet for Patients:  PinkCheek.be Fact Sheet for Healthcare Providers:  GravelBags.it This test is not yet ap proved or cleared by the [place] FDA and  has been authorized for detection and/or diagnosis of SARS-CoV-2 by FDA under an Emergency Use Authorization (EUA). This EUA will remain  in effect (meaning this test can be used) for the duration of the COVID-19 declaration under Section 564(b)(1) of the Act, 21 U.S.C. section 360bbb-3(b)(1), unless the authorization is terminated or revoked sooner.    Influenza A by PCR NEGATIVE NEGATIVE Final   Influenza B by PCR NEGATIVE NEGATIVE Final    Comment: (NOTE) The Xpert Xpress SARS-CoV-2/FLU/RSV assay is intended as an aid in  the diagnosis of influenza from Nasopharyngeal swab specimens and  should not be used as a sole basis for treatment. Nasal washings and  aspirates are unacceptable for Xpert Xpress SARS-CoV-2/FLU/RSV  testing. Fact Sheet for Patients: PinkCheek.be Fact Sheet for Healthcare Providers: GravelBags.it This test is not yet approved or cleared by the [place] FDA and  has been  authorized for detection and/or diagnosis of SARS-CoV-2 by  FDA under an Emergency Use Authorization (EUA). This EUA will remain  in effect (meaning this test can be  used) for the duration of the  Covid-19 declaration under Section 564(b)(1) of the Act, 21  U.S.C. section 360bbb-3(b)(1), unless the authorization is  terminated or revoked. Performed at [facility], [place] [address], [place], [place] [address]   Blood Culture (routine x 2)     Status: None (Preliminary result)   Collection Time: 08/09/19  1:20 AM   Specimen: BLOOD RIGHT HAND  Result Value Ref Range Status   Specimen Description BLOOD RIGHT HAND  Final   Special Requests   Final    BOTTLES DRAWN AEROBIC AND ANAEROBIC Blood Culture adequate volume   Culture   Final    NO GROWTH 2 DAYS Performed at [facility], [place] [address] [place], [place] [address]    Report Status PENDING  Incomplete  Culture, blood (routine x 2)     Status: Abnormal (Preliminary result)   Collection Time: 08/10/19  8:15 AM   Specimen: BLOOD  Result Value Ref Range Status   Specimen Description BLOOD LEFT ANTECUBITAL  Final   Special Requests   Final    BOTTLES DRAWN AEROBIC AND ANAEROBIC Blood Culture adequate volume   Culture  Setup Time   Final    IN BOTH AEROBIC AND ANAEROBIC BOTTLES GRAM POSITIVE COCCI CRITICAL RESULT CALLED TO, READ BACK BY AND VERIFIED WITH: A MEYER PHARMD 08/10/19 2047 JDW Performed at [facility], [place] [address], [place], [place] [address]    Culture STAPHYLOCOCCUS AUREUS VIRIDANS STREPTOCOCCUS  (A)  Final   Report Status PENDING  Incomplete  Culture, blood (routine x 2)     Status: Abnormal (Preliminary result)   Collection Time: 08/10/19  8:20 AM   Specimen: BLOOD  Result Value Ref Range Status   Specimen Description BLOOD LEFT ANTECUBITAL  Final   Special Requests   Final    BOTTLES DRAWN AEROBIC ONLY Blood Culture adequate volume   Culture  Setup Time   Final    AEROBIC BOTTLE ONLY GRAM  POSITIVE COCCI CRITICAL RESULT CALLED TO, READ BACK BY AND VERIFIED WITH: J LEDFORD PHARMD 08/11/19 0000 JDW    Culture (A)  Final    STAPHYLOCOCCUS AUREUS SUSCEPTIBILITIES TO FOLLOW Performed at [facility], [place] [address], [place], [place] [address]    Report Status PENDING  Incomplete  Blood Culture ID Panel (Reflexed)     Status: Abnormal   Collection Time: 08/10/19  8:20 AM  Result Value Ref Range Status   Enterococcus species NOT DETECTED NOT DETECTED Final   Listeria monocytogenes NOT DETECTED NOT DETECTED Final   Staphylococcus species DETECTED (A) NOT DETECTED Final    Comment: CRITICAL RESULT CALLED TO, READ BACK BY AND VERIFIED WITH: J LEDFORD PHARMD 08/11/19 0000 JDW    Staphylococcus aureus (BCID) DETECTED (A) NOT DETECTED Final    Comment: Methicillin (oxacillin)-resistant Staphylococcus aureus (MRSA). MRSA is predictably resistant to beta-lactam antibiotics (except ceftaroline). Preferred therapy is vancomycin unless clinically contraindicated. Patient requires contact precautions if  hospitalized. CRITICAL RESULT CALLED TO, READ BACK BY AND VERIFIED WITH: J LEDFORD PHARMD 08/11/19 0000 JDW    Methicillin resistance DETECTED (A) NOT DETECTED Final    Comment: CRITICAL RESULT CALLED TO, READ BACK BY AND VERIFIED WITH: J LEDFORD PHARMD 08/11/19 0000 JDW    Streptococcus species NOT DETECTED NOT DETECTED Final   Streptococcus agalactiae NOT DETECTED NOT DETECTED Final   Streptococcus pneumoniae NOT DETECTED NOT DETECTED Final   Streptococcus pyogenes NOT DETECTED NOT DETECTED Final   Acinetobacter baumannii  NOT DETECTED NOT DETECTED Final   Enterobacteriaceae species NOT DETECTED NOT DETECTED Final   Enterobacter cloacae complex NOT DETECTED NOT DETECTED Final   Escherichia coli NOT DETECTED NOT DETECTED Final   Klebsiella oxytoca NOT DETECTED NOT DETECTED Final   Klebsiella pneumoniae NOT DETECTED NOT DETECTED Final   Proteus species NOT DETECTED NOT DETECTED Final    Serratia marcescens NOT DETECTED NOT DETECTED Final   Haemophilus influenzae NOT DETECTED NOT DETECTED Final   Neisseria meningitidis NOT DETECTED NOT DETECTED Final   Pseudomonas aeruginosa NOT DETECTED NOT DETECTED Final   Candida albicans NOT DETECTED NOT DETECTED Final   Candida glabrata NOT DETECTED NOT DETECTED Final   Candida krusei NOT DETECTED NOT DETECTED Final   Candida parapsilosis NOT DETECTED NOT DETECTED Final   Candida tropicalis NOT DETECTED NOT DETECTED Final    Comment: Performed at [facility], [place] [address] [place], [place] [address]  Stat Gram stain     Status: None   Collection Time: 08/10/19  3:37 PM   Specimen: Body Fluid  Result Value Ref Range Status   Specimen Description FLUID SYNOVIAL LEFT KNEE  Final   Special Requests NONE  Final   Gram Stain   Final    ABUNDANT WBC PRESENT,BOTH PMN AND MONONUCLEAR RARE GRAM POSITIVE COCCI Performed at [facility], [place] [address] [place], [place] [address]    Report Status 08/10/2019 FINAL  Final  Culture, body fluid-bottle     Status: Abnormal (Preliminary result)   Collection Time: 08/10/19  3:37 PM   Specimen: Fluid  Result Value Ref Range Status   Specimen Description FLUID SYNOVIAL LEFT KNEE  Final   Special Requests BOTTLES DRAWN AEROBIC AND ANAEROBIC  Final   Gram Stain   Final    GRAM POSITIVE COCCI RESULT CALLED TO, READ BACK BY AND VERIFIED WITH: L KING RN 08/11/19 1745 JDW IN BOTH AEROBIC AND ANAEROBIC BOTTLES Performed at [facility], [place] [address], [place], [place] [address]    Culture STAPHYLOCOCCUS AUREUS (A)  Final   Report Status PENDING  Incomplete  MRSA PCR Screening     Status: None   Collection Time: 08/10/19  5:03 PM   Specimen: Nasopharyngeal  Result Value Ref Range Status   MRSA by PCR NEGATIVE NEGATIVE Final    Comment:        The GeneXpert MRSA Assay (FDA approved for NASAL specimens only), is one component of a comprehensive MRSA  colonization surveillance program. It is not intended to diagnose MRSA infection nor to guide or monitor treatment for MRSA infections. Performed at [facility], [place] [address] [place], [place] [address]   Culture, blood (routine x 2)     Status: None (Preliminary result)   Collection Time: 08/11/19 12:10 PM   Specimen: BLOOD LEFT HAND  Result Value Ref Range Status   Specimen Description BLOOD LEFT HAND  Final   Special Requests   Final    BOTTLES DRAWN AEROBIC ONLY Blood Culture results may not be optimal due to an inadequate volume of blood received in culture bottles   Culture  Setup Time   Final    GRAM POSITIVE COCCI AEROBIC BOTTLE ONLY CRITICAL VALUE NOTED.  VALUE IS CONSISTENT WITH PREVIOUSLY REPORTED AND CALLED VALUE. Performed at [facility], [place] [address] [place], [place]  [address]    Culture [facility] POSITIVE COCCI  Final   Report Status PENDING  Incomplete  Radiology Studies: DG Chest 1 View  Result Date: 08/10/2019 CLINICAL DATA:  Dyspnea. EXAM: CHEST  1 VIEW COMPARISON:  August 08, 2018 FINDINGS: There is a multi lead AICD. Multiple sternal wires are seen. Persistently decreased lung volumes are seen with chronic appearing increased interstitial lung markings noted bilaterally. Mild persistent atelectasis is seen within the right lung base. There is no evidence of a pleural effusion or pneumothorax. The cardiac silhouette is moderately enlarged. Multilevel degenerative changes seen throughout the thoracic spine. IMPRESSION: 1. Persistently decreased lung volumes with chronic appearing increased interstitial lung markings and right basilar atelectasis. 2. Stable cardiomegaly. Electronically Signed   By: Virgina Norfolk M.D.   On: 08/10/2019 23:42   VAS [place] ABI WITH/WO TBI  Result Date: 08/11/2019 LOWER EXTREMITY DOPPLER STUDY Indications: Peripheral artery disease. High Risk Factors: Hypertension, hyperlipidemia, Diabetes.  Comparison Study: ABI  03/02/2019- Right noncompressible, left=1.23 Performing Technologist: Maudry Mayhew MHA, RVT, RDCS, RDMS  Examination Guidelines: A complete evaluation includes at minimum, Doppler waveform signals and systolic blood pressure reading at the level of bilateral brachial, anterior tibial, and posterior tibial arteries, when vessel segments are accessible. Bilateral testing is considered an integral part of a complete examination. Photoelectric Plethysmograph (PPG) waveforms and toe systolic pressure readings are included as required and additional duplex testing as needed. Limited examinations for reoccurring indications may be performed as noted.  ABI Findings: +--------+------------------+-----+---------+----------------------------------+ Right   Rt Pressure (mmHg)IndexWaveform Comment                            +--------+------------------+-----+---------+----------------------------------+ Brachial                                Not evaluated due to                                                       bandaging/dialysis access          +--------+------------------+-----+---------+----------------------------------+ PTA     115               0.98 triphasic                                   +--------+------------------+-----+---------+----------------------------------+ DP      120               1.03 triphasic                                   +--------+------------------+-----+---------+----------------------------------+ +--------+------------------+-----+---------+-------+ Left    Lt Pressure (mmHg)IndexWaveform Comment +--------+------------------+-----+---------+-------+ [id number]                    triphasic        +--------+------------------+-----+---------+-------+ PTA     112               0.96 triphasic        +--------+------------------+-----+---------+-------+ DP      139               1.19 triphasic         +--------+------------------+-----+---------+-------+ +-------+-----------+-----------+---------------+------------+ ABI/TBIToday's ABIToday's TBIPrevious ABI   Previous  TBI +-------+-----------+-----------+---------------+------------+ Right  1.03                  Noncompressible             +-------+-----------+-----------+---------------+------------+ Left   1.19                  1.23                        +-------+-----------+-----------+---------------+------------+ Bilateral ABIs appear essentially unchanged when compared to prior study.  Summary: Right: Resting right ankle-brachial index is within normal range. No evidence of significant right lower extremity arterial disease. Left: Resting left ankle-brachial index is within normal range. No evidence of significant left lower extremity arterial disease.  *See table(s) above for measurements and observations.  Electronically signed by Curt Jews MD on 08/11/2019 at 4:49:28 PM.   Final       Scheduled Meds: . allopurinol  100 mg Oral Daily  . amiodarone  200 mg Oral Daily  . aspirin EC  81 mg Oral QHS  . atorvastatin  80 mg Oral QPM  . calcitRIOL  0.25 mcg Oral Q M,W,F-HD  . calcium acetate  667 mg Oral TID WC  . Chlorhexidine Gluconate Cloth  6 each Topical Q0600  . clopidogrel  75 mg Oral Daily  . darbepoetin (ARANESP) injection - DIALYSIS  60 mcg Intravenous Q Wed-HD  . fluticasone furoate-vilanterol  1 puff Inhalation Daily  . multivitamin  1 tablet Oral QHS  . mupirocin ointment  1 application Topical BID  . pantoprazole  40 mg Oral Daily  . rifampin  300 mg Oral Q8H  . sodium chloride flush  3 mL Intravenous Q12H   Continuous Infusions: . sodium chloride    . vancomycin 1,000 mg (08/11/19 2205)     LOS: 4 days      Bonnell Public, MD [facility] Pager: [email or web address] Password [facility] 08/12/2019, 11:25 AM

## 2019-08-12 NOTE — Progress Notes (Signed)
[facility] NEPHROLOGY PROGRESS NOTE  Assessment/ Plan: Pt is a 86 y.o. yo male with history of HTN, CAD status post CABG, ESRD on HD MWF, presented with shortness of breath/fluid overload.  Outpt HD: MWF [facility]  4h   400/800   91.5kg   2/2 bath  AVF R  Hep none  - venofer 50 / wk  - mircera 150 ug q2 wks  - calcitriol 1.0 ug tiw   #Acute on chronic respiratory failure with hypoxia due to fluid overload and possible infection.  S/p serial HD with UF to optimize.  On supplemental oxygen  # ESRD MWF: HD per MWF schedule; he is s/p serial dialysis for ultrafiltration as above.  Needs today's labs - will follow.  I have asked that he work to limit fluid intake   #Sepsis, MRSA bacteremia and left knee septic joint.  On vanc. abx per ID.  ID recs TEE  # Anemia of CKD: Monitor hemoglobin.  Holding iron because of possible infection. Aranesp is ordered 60 mcg weekly on Wed  # Secondary hyperparathyroidism: Phosphorus level was 2.5; s/p reduced PhosLo dose.  Continue calcitriol.  # HTN/volume: hypotension setting of infection    Subjective:  S/p serial HD with HD on 1/6 per regular MWF schedule with 2 kg UF.  States that he is not normally on oxygen except at night; has been on 3 liters this AM.  Feels better but still overloaded.   Review of systems:  Denies n/v Ate breakfast    Objective Vital signs in last 24 hours: Vitals:   08/11/19 2258 08/12/19 0051 08/12/19 0100 08/12/19 0536  BP: 110/66 (!) 101/58  101/61  Pulse: 96 99  60  Resp: 20 20  20   Temp: (!) 97.5 F (36.4 C) 98 F (36.7 C)  (!) 97.4 F (36.3 C)  TempSrc: Oral Oral  Oral  SpO2: 93% 98%  97%  Weight:   99 kg    Weight change: -6 kg  Intake/Output Summary (Last 24 hours) at 08/12/2019 0805 Last data filed at 08/11/2019 2258 Gross per 24 hour  Intake 700 ml  Output 2000 ml  Net -1300 ml    Labs: Basic Metabolic Panel: Recent Labs  Lab 08/18/2019 2045 08/25/2019 2101 08/09/19 0120  08/09/19 0509 08/10/19 1610 08/11/19 0529  NA 135 141  --  139 136  --   K 4.2 4.2  --  4.6 3.9  --   CL 93* 97*  --  94* 96*  --   CO2 24  --   --  24 25  --   GLUCOSE 139* 135*  --  154* 98 93  BUN 41* 43*  --  48* 29*  --   CREATININE 5.93* 6.00* 6.30* 8.14* 4.26*  --   CALCIUM 9.0  --   --  9.1 8.8*  --   PHOS  --   --   --   --  2.5  --    Liver Function Tests: Recent Labs  Lab 08/20/2019 2045 08/10/19 1610  AST 54*  --   ALT 30  --   ALKPHOS 110  --   BILITOT 2.0*  --   PROT 7.2  --   ALBUMIN 3.1* 2.4*   CBC: Recent Labs  Lab 08/10/2019 2045 08/07/2019 2101 08/09/19 0509 08/10/19 0031 08/10/19 0807 08/10/19 1610 08/11/19 0529  WBC 4.2   < > 3.2* 4.5 4.9 5.3 4.0  NEUTROABS 2.6  --  2.7  --   --   --  2.9  HGB 10.0*  --  8.8* 9.0* 8.9* 8.6* 9.1*  HCT 34.1*  --  28.9* 29.1* 29.0* 26.8* 29.9*  MCV 105.2*   < > 102.8* 102.5* 100.7* 97.8 102.7*  PLT 61*   < > 46* 46* 44* 49* 31*   < > = values in this interval not displayed.   Cardiac Enzymes: No results for input(s): CKTOTAL, CKMB, CKMBINDEX, TROPONINI in the last 168 hours. CBG: Recent Labs  Lab 08/11/19 1017 08/11/19 1109 08/11/19 1156 08/11/19 1631 08/12/19 0242  GLUCAP 238* 229* 214* 183* 109*    Iron Studies:  No results for input(s): IRON, TIBC, TRANSFERRIN, FERRITIN in the last 72 hours. Studies/Results: DG Chest 1 View  Result Date: 08/10/2019 CLINICAL DATA:  Dyspnea. EXAM: CHEST  1 VIEW COMPARISON:  August 08, 2018 FINDINGS: There is a multi lead AICD. Multiple sternal wires are seen. Persistently decreased lung volumes are seen with chronic appearing increased interstitial lung markings noted bilaterally. Mild persistent atelectasis is seen within the right lung base. There is no evidence of a pleural effusion or pneumothorax. The cardiac silhouette is moderately enlarged. Multilevel degenerative changes seen throughout the thoracic spine. IMPRESSION: 1. Persistently decreased lung volumes with  chronic appearing increased interstitial lung markings and right basilar atelectasis. 2. Stable cardiomegaly. Electronically Signed   By: Virgina Norfolk M.D.   On: 08/10/2019 23:42   VAS [place] ABI WITH/WO TBI  Result Date: 08/11/2019 LOWER EXTREMITY DOPPLER STUDY Indications: Peripheral artery disease. High Risk Factors: Hypertension, hyperlipidemia, Diabetes.  Comparison Study: ABI 03/02/2019- Right noncompressible, left=1.23 Performing Technologist: Maudry Mayhew MHA, RVT, RDCS, RDMS  Examination Guidelines: A complete evaluation includes at minimum, Doppler waveform signals and systolic blood pressure reading at the level of bilateral brachial, anterior tibial, and posterior tibial arteries, when vessel segments are accessible. Bilateral testing is considered an integral part of a complete examination. Photoelectric Plethysmograph (PPG) waveforms and toe systolic pressure readings are included as required and additional duplex testing as needed. Limited examinations for reoccurring indications may be performed as noted.  ABI Findings: +--------+------------------+-----+---------+----------------------------------+ Right   Rt Pressure (mmHg)IndexWaveform Comment                            +--------+------------------+-----+---------+----------------------------------+ Brachial                                Not evaluated due to                                                       bandaging/dialysis access          +--------+------------------+-----+---------+----------------------------------+ PTA     115               0.98 triphasic                                   +--------+------------------+-----+---------+----------------------------------+ DP      120               1.03 triphasic                                   +--------+------------------+-----+---------+----------------------------------+ +--------+------------------+-----+---------+-------+  Left    Lt Pressure  (mmHg)IndexWaveform Comment +--------+------------------+-----+---------+-------+ [id number]                    triphasic        +--------+------------------+-----+---------+-------+ PTA     112               0.96 triphasic        +--------+------------------+-----+---------+-------+ DP      139               1.19 triphasic        +--------+------------------+-----+---------+-------+ +-------+-----------+-----------+---------------+------------+ ABI/TBIToday's ABIToday's TBIPrevious ABI   Previous TBI +-------+-----------+-----------+---------------+------------+ Right  1.03                  Noncompressible             +-------+-----------+-----------+---------------+------------+ Left   1.19                  1.23                        +-------+-----------+-----------+---------------+------------+ Bilateral ABIs appear essentially unchanged when compared to prior study.  Summary: Right: Resting right ankle-brachial index is within normal range. No evidence of significant right lower extremity arterial disease. Left: Resting left ankle-brachial index is within normal range. No evidence of significant left lower extremity arterial disease.  *See table(s) above for measurements and observations.  Electronically signed by Curt Jews MD on 08/11/2019 at 4:49:28 PM.   Final     Medications: Infusions: . sodium chloride    . vancomycin 1,000 mg (08/11/19 2205)    Scheduled Medications: . allopurinol  100 mg Oral Daily  . amiodarone  200 mg Oral Daily  . aspirin EC  81 mg Oral QHS  . atorvastatin  80 mg Oral QPM  . calcitRIOL  0.25 mcg Oral Q M,W,F-HD  . calcium acetate  667 mg Oral TID WC  . Chlorhexidine Gluconate Cloth  6 each Topical Q0600  . clopidogrel  75 mg Oral Daily  . darbepoetin (ARANESP) injection - DIALYSIS  60 mcg Intravenous Q Wed-HD  . fluticasone furoate-vilanterol  1 puff Inhalation Daily  . multivitamin  1 tablet Oral QHS  . mupirocin ointment   1 application Topical BID  . pantoprazole  40 mg Oral Daily  . rifampin  300 mg Oral Q8H  . sodium chloride flush  3 mL Intravenous Q12H    have reviewed scheduled and prn medications.  Physical Exam:   General: adult male in bed in NAD Heart: s1s2 no rub Lungs: occ course breath sounds; unlabored at rest on 3 liters oxygen Abdomen:soft, Non-tender, distended/obese habitus Extremities:trace to 1+ LE edema Neuro alert and oriented x 3; conversant and provides hx  Psych normal mood and affect Dialysis Access: Right AV fistula bruit and thrill   Claudia Desanctis 08/12/2019,8:05 AM  LOS: 4 days

## 2019-08-12 NOTE — Progress Notes (Signed)
   Subjective:  Patient reports pain as moderate.  Not oriented this am to place or time.  Complains of persistent left knee pain and improved left shoulder pain.  Objective:   VITALS:   Vitals:   08/12/19 0051 08/12/19 0100 08/12/19 0536 08/12/19 0734  BP: (!) 101/58  101/61   Pulse: 99  60   Resp: 20  20   Temp: 98 F (36.7 C)  (!) 97.4 F (36.3 C)   TempSrc: Oral  Oral   SpO2: 98%  97% 98%  Weight:  99 kg      Intact pulses distally No cellulitis present Compartment soft moderate left knee effusion and warmth, no erythema NVI   Lab Results  Component Value Date   WBC 4.0 08/11/2019   HGB 9.1 (L) 08/11/2019   HCT 29.9 (L) 08/11/2019   MCV 102.7 (H) 08/11/2019   PLT 31 (L) 08/11/2019   BMET    Component Value Date/Time   NA 136 08/10/2019 1610   NA 144 02/02/2019 1037   K 3.9 08/10/2019 1610   CL 96 (L) 08/10/2019 1610   CO2 25 08/10/2019 1610   GLUCOSE 93 08/11/2019 0529   BUN 29 (H) 08/10/2019 1610   BUN 30 (H) 02/02/2019 1037   CREATININE 4.26 (H) 08/10/2019 1610   CREATININE 3.09 (H) 07/01/2016 0907   CALCIUM 8.8 (L) 08/10/2019 1610   GFRNONAA 12 (L) 08/10/2019 1610   GFRNONAA 21 (L) 02/15/2016 1607   GFRAA 14 (L) 08/10/2019 1610   GFRAA 24 (L) 02/15/2016 1607     Assessment/Plan:     Principal Problem:   MRSA bacteremia Active Problems:   Hypercholesterolemia   DM (diabetes mellitus), type 2 with renal complications (HCC)   Benign hypertensive heart disease without heart failure   Severe obstructive sleep apnea   Chronic combined systolic and diastolic CHF (congestive heart failure) (HCC)   ICD (implantable cardioverter-defibrillator) discharge   ESRD (end stage renal disease) (HCC)   GERD (gastroesophageal reflux disease)   Acute exacerbation of CHF (congestive heart failure) ([place])   Septic arthritis ([place])  1. Left knee gout with super infection, likely MRSA - will need irrigation and debridement tgiven no improvement - plan for  surgery tomorrow afternoon - NPO tonight at [place] - 2. Left shoulder pain - improving at this time.  If this should worsen will need IR for aspiration, and possible surgery, which unfortunately will need a general anesthetic, unlike the knee.  Nasire Reali 08/12/2019, 10:28 AM   Geralynn Rile, MD [phone]

## 2019-08-12 NOTE — Evaluation (Addendum)
Occupational Therapy Evaluation Patient Details Name: Shane [name] MRN: [id number] DOB: 19-Jul-1934 Today's Date: 08/12/2019    History of Present Illness 86 yo admitted with SOB with CHF exacerbation. PMHx: ESRD on HD MWF, AICD, CHF, HTN, DM, HLD, CAD, BPH, PVD, gout, RT TKA   Clinical Impression   PTA pt living at home with wife and granddaughter, using RW and completing functional mobility around the house. He received assist for LB ADL and IADL at baseline. He shares he has fallen 7 times in the last 6 months, resulting in a L rotator cuff tear. At time of eval, pt requiring max A to complete bed mobility with one posterior LOB on bed. Pt then completed squat pivot to BSC with max A +2. He demonstrated inability to clear hips from EOB. Once on [facility], pt needing total A for peri care. Stedy then used with mod A +2 to recliner. Pt also presents with cognitive deficits, difficulty orienting self to situation- as well as poor recall and awareness of safety/deficits. Given current status, recommending SNF at d/c to continue to reduce likelihood of falls and increase BADL independence and mobility prior to returning home. Will continue to follow per POC listed below.    Follow Up Recommendations  SNF;Supervision/Assistance - 24 hour    Equipment Recommendations  3 in 1 bedside commode;Wheelchair (measurements OT);Wheelchair cushion (measurements OT)    Recommendations for Other Services       Precautions / Restrictions Precautions Precautions: Fall Precaution Comments: L shoulder rotator cuff tear Restrictions Other Position/Activity Restrictions: pt not able to tolerate WB in LUE      Mobility Bed Mobility Overal bed mobility: Needs Assistance Bed Mobility: Supine to Sit     Supine to sit: Max assist     General bed mobility comments: cues for initiation and sequencing, assist for BLE translation to EOB and support at trunk considering limited use of LUE. Pt with posterior LOB  once sitting EOB  Transfers Overall transfer level: Needs assistance Equipment used: Ambulation equipment used Transfers: Sit to/from [employer] Sit to Stand: Mod assist;+2 safety/equipment;+2 physical assistance(in stedy)   Squat pivot transfers: Max assist;+2 physical assistance;+2 safety/equipment     General transfer comment: pt completed squat pivot to [facility] with max A +2, unable to extend legs and/or clear hips. Mod A +2 sit <> stand completed in stedy, which was used from [facility] to recliner    Balance Overall balance assessment: History of Falls;Needs assistance(has had 7 falls in the past 6 months) Sitting-balance support: Single extremity supported;Feet supported Sitting balance-Leahy Scale: Poor Sitting balance - Comments: one posterior LOB once sitting EOB, requires support to maintain static sitting balance Postural control: Posterior lean Standing balance support: Single extremity supported;During functional activity Standing balance-Leahy Scale: Zero Standing balance comment: max A +2 to support squat pivot, and or use of stedy                           ADL either performed or assessed with clinical judgement   ADL Overall ADL's : Needs assistance/impaired Eating/Feeding: Set up;Sitting   Grooming: Set up;Sitting   Upper Body Bathing: Moderate assistance;Sitting   Lower Body Bathing: Total assistance;Sitting/lateral leans;Sit to/from stand Lower Body Bathing Details (indicate cue type and reason): gets assist at baseline Upper Body Dressing : Minimal assistance;Sitting   Lower Body Dressing: Total assistance;Sitting/lateral leans;Sit to/from stand Lower Body Dressing Details (indicate cue type and reason): to don socks, gets assist  at baseline Toilet Transfer: Maximal assistance;+2 for physical assistance;+2 for safety/equipment;Squat-pivot;BSC Toilet Transfer Details (indicate cue type and reason): initial transfer to [facility] completed as  squat pivot with max A +2. Pt having difficulty clearing hips and achieving stand. From [facility], stedy used to recliner Toileting- Water quality scientist and Hygiene: Total assistance;Sit to/from stand Toileting - Clothing Manipulation Details (indicate cue type and reason): total A from OT to wipe after BM, pt requiring BUE support from steady and assist from +2 person to maintain standing balance     Functional mobility during ADLs: Maximal assistance;+2 for physical assistance(stedy and squat pivot only) General ADL Comments: pt limited by decreased activity tolerance, generalized weakness, and cognitive deficits limiting safe BADL     Vision Patient Visual Report: No change from baseline       Perception     Praxis      Pertinent Vitals/Pain Pain Assessment: Faces Faces Pain Scale: Hurts little more Pain Location: L knee and L shoulder Pain Descriptors / Indicators: Grimacing;Guarding;Sore Pain Intervention(s): Limited activity within patient's tolerance;Monitored during session;Repositioned     Hand Dominance     Extremity/Trunk Assessment Upper Extremity Assessment Upper Extremity Assessment: Generalized weakness;LUE deficits/detail LUE Deficits / Details: rotator cuff tear   Lower Extremity Assessment Lower Extremity Assessment: Generalized weakness       Communication Communication Communication: No difficulties   Cognition Arousal/Alertness: Awake/alert Behavior During Therapy: WFL for tasks assessed/performed Overall Cognitive Status: Impaired/Different from baseline Area of Impairment: Orientation;Memory;Safety/judgement;Problem solving                 Orientation Level: Disoriented to;Situation(stated he is waiting for ride to go home, asking if he needs a mask to "go down there")   Memory: Decreased short-term memory   Safety/Judgement: Decreased awareness of deficits;Decreased awareness of safety   Problem Solving: Slow processing;Requires verbal  cues;Requires tactile cues General Comments: pt having waxing and waning cognition throughout session. States he is waiting for a ride out of hospital upon OT arrival. Seems unaware of deficits and how it impacts his safety and ability to return home   General Comments       Exercises     Shoulder Instructions      Home Living Family/patient expects to be discharged to:: Private residence Living Arrangements: Spouse/significant other;Other relatives Available Help at Discharge: Family;Available 24 hours/day Type of Home: House Home Access: Stairs to enter [employer] of Steps: 1   Home Layout: Two level;Able to live on main level with bedroom/bathroom;Laundry or work area in Building surveyor of Steps: main level and basement, does not need to go in basement   [employer] Shower/Tub: Occupational psychologist: Handicapped height     Home Equipment: Environmental consultant - 2 wheels;Cane - single point;Wheelchair - manual;Toilet riser;Shower seat - built in;Hand held shower head;Grab bars - tub/shower          Prior Functioning/Environment Level of Independence: Needs assistance  Gait / Transfers Assistance Needed: walks with  walker,  reports knee buckling and 7 falls in the last 6 months ADL's / Homemaking Assistance Needed: wife assisting with lower body bathing and dressing  x 2 weeks            OT Problem List: Decreased strength;Decreased knowledge of use of DME or AE;Decreased range of motion;Decreased coordination;Decreased activity tolerance;Decreased cognition;Cardiopulmonary status limiting activity;Impaired UE functional use;Impaired balance (sitting and/or standing);Decreased safety awareness;Pain      OT Treatment/Interventions: Self-care/ADL training;Therapeutic exercise;Patient/family education;Balance training;Energy conservation;Therapeutic activities;DME and/or  AE instruction;Cognitive remediation/compensation    OT Goals(Current  goals can be found in the care plan section) Acute Rehab OT Goals Patient Stated Goal: to go home OT Goal Formulation: With patient Time For Goal Achievement: 08/26/19 Potential to Achieve Goals: Good  OT Frequency: Min 2X/week   Barriers to D/C:            Co-evaluation              AM-PAC OT "6 Clicks" Daily Activity     Outcome Measure Help from another person eating meals?: A Little Help from another person taking care of personal grooming?: A Little Help from another person toileting, which includes using toliet, bedpan, or urinal?: A Lot Help from another person bathing (including washing, rinsing, drying)?: A Lot Help from another person to put on and taking off regular upper body clothing?: A Lot Help from another person to put on and taking off regular lower body clothing?: Total 6 Click Score: 13   End of Session Equipment Utilized During Treatment: Gait belt;Other (comment);Oxygen(stedy) Nurse Communication: Mobility status;Need for lift equipment  Activity Tolerance: Patient tolerated treatment well Patient left: in chair;with call bell/phone within reach;with chair alarm set  OT Visit Diagnosis: Unsteadiness on feet (R26.81);Other abnormalities of gait and mobility (R26.89);Muscle weakness (generalized) (M62.81);Repeated falls (R29.6);History of falling (Z91.81);Other symptoms and signs involving cognitive function;Pain Pain - Right/Left: Left Pain - part of body: Knee                Time: 0820-0855 OT Time Calculation (min): 35 min Charges:  OT General Charges $OT Visit: 1 Visit OT Evaluation $OT Eval Moderate Complexity: 1 Mod OT Treatments $Self Care/Home Management : 8-22 mins  Zenovia Jarred, MSOT, OTR/L Acute Rehabilitation Services [facility] Office Number: [phone]  Zenovia Jarred 08/12/2019, 10:36 AM

## 2019-08-12 NOTE — Progress Notes (Signed)
Progress Note  Patient Name: Shane [name] Date of Encounter: 08/12/2019  Primary Cardiologist: Mertie Moores, MD   Subjective   No chest pain this morning.  Breathing is unchanged.  He is actually quite lucid at the time of my evaluation, sitting up in the chair at the bedside on O2 per nasal cannula.  Inpatient Medications    Scheduled Meds: . allopurinol  100 mg Oral Daily  . amiodarone  200 mg Oral Daily  . aspirin EC  81 mg Oral QHS  . atorvastatin  80 mg Oral QPM  . calcitRIOL  0.25 mcg Oral Q M,W,F-HD  . calcium acetate  667 mg Oral TID WC  . Chlorhexidine Gluconate Cloth  6 each Topical Q0600  . clopidogrel  75 mg Oral Daily  . darbepoetin (ARANESP) injection - DIALYSIS  60 mcg Intravenous Q Wed-HD  . fluticasone furoate-vilanterol  1 puff Inhalation Daily  . multivitamin  1 tablet Oral QHS  . mupirocin ointment  1 application Topical BID  . pantoprazole  40 mg Oral Daily  . rifampin  300 mg Oral Q8H  . sodium chloride flush  3 mL Intravenous Q12H   Continuous Infusions: . sodium chloride    . vancomycin 1,000 mg (08/11/19 2205)   PRN Meds: sodium chloride, acetaminophen, albuterol, calcium carbonate, fentaNYL (SUBLIMAZE) injection, ondansetron (ZOFRAN) IV, polyethylene glycol, sodium chloride flush   Vital Signs    Vitals:   08/12/19 0051 08/12/19 0100 08/12/19 0536 08/12/19 0734  BP: (!) 101/58  101/61   Pulse: 99  60   Resp: 20  20   Temp: 98 F (36.7 C)  (!) 97.4 F (36.3 C)   TempSrc: Oral  Oral   SpO2: 98%  97% 98%  Weight:  99 kg      Intake/Output Summary (Last 24 hours) at 08/12/2019 1125 Last data filed at 08/12/2019 1100 Gross per 24 hour  Intake 700 ml  Output 2000 ml  Net -1300 ml   Last 3 Weights 08/12/2019 08/11/2019 08/11/2019  Weight (lbs) 218 lb 4.1 oz 222 lb 10.6 oz 224 lb 13.9 oz  Weight (kg) 99 kg 101 kg 102 kg      Telemetry    Ventricular paced rhythm - Personally Reviewed   Physical Exam  Alert, elderly male in no  distress GEN: No acute distress.   Neck: No JVD Cardiac: RRR, 2/6 systolic murmur left sternal border.  Respiratory:  Diminished breath sounds both lung bases, otherwise clear GI: Soft, nontender, non-distended  MS: No pretibial edema; No deformity.  Left knee swelling noted Neuro:  Nonfocal  Psych: Normal affect   Labs    High Sensitivity Troponin:   Recent Labs  Lab 08/28/2019 2045 08/09/19 0120  TROPONINIHS 75* 133*      Chemistry Recent Labs  Lab 08/22/2019 2045 09/04/2019 2045 08/11/2019 2101 08/09/19 0120 08/09/19 0509 08/10/19 1610 08/11/19 0529  NA 135  --  141  --  139 136  --   K 4.2  --  4.2  --  4.6 3.9  --   CL 93*  --  97*  --  94* 96*  --   CO2 24  --   --   --  24 25  --   GLUCOSE 139*   < > 135*  --  154* 98 93  BUN 41*  --  43*  --  48* 29*  --   CREATININE 5.93*   < > 6.00* 6.30* 8.14* 4.26*  --  CALCIUM 9.0  --   --   --  9.1 8.8*  --   PROT 7.2  --   --   --   --   --   --   ALBUMIN 3.1*  --   --   --   --  2.4*  --   AST 54*  --   --   --   --   --   --   ALT 30  --   --   --   --   --   --   ALKPHOS 110  --   --   --   --   --   --   BILITOT 2.0*  --   --   --   --   --   --   GFRNONAA 8*  --   --  7* 5* 12*  --   GFRAA 9*  --   --  9* 6* 14*  --   ANIONGAP 18*  --   --   --  21* 15  --    < > = values in this interval not displayed.     Hematology Recent Labs  Lab 08/10/19 0807 08/10/19 1610 08/11/19 0529  WBC 4.9 5.3 4.0  RBC 2.88* 2.74* 2.91*  HGB 8.9* 8.6* 9.1*  HCT 29.0* 26.8* 29.9*  MCV 100.7* 97.8 102.7*  MCH 30.9 31.4 31.3  MCHC 30.7 32.1 30.4  RDW 18.8* 18.8* 18.7*  PLT 44* 49* 31*    BNPNo results for input(s): BNP, PROBNP in the last 168 hours.   DDimer  Recent Labs  Lab 09/01/2019 2045  DDIMER 7.37*     Radiology    DG Chest 1 View  Result Date: 08/10/2019 CLINICAL DATA:  Dyspnea. EXAM: CHEST  1 VIEW COMPARISON:  August 08, 2018 FINDINGS: There is a multi lead AICD. Multiple sternal wires are seen. Persistently  decreased lung volumes are seen with chronic appearing increased interstitial lung markings noted bilaterally. Mild persistent atelectasis is seen within the right lung base. There is no evidence of a pleural effusion or pneumothorax. The cardiac silhouette is moderately enlarged. Multilevel degenerative changes seen throughout the thoracic spine. IMPRESSION: 1. Persistently decreased lung volumes with chronic appearing increased interstitial lung markings and right basilar atelectasis. 2. Stable cardiomegaly. Electronically Signed   By: Virgina Norfolk M.D.   On: 08/10/2019 23:42   VAS [place] ABI WITH/WO TBI  Result Date: 08/11/2019 LOWER EXTREMITY DOPPLER STUDY Indications: Peripheral artery disease. High Risk Factors: Hypertension, hyperlipidemia, Diabetes.  Comparison Study: ABI 03/02/2019- Right noncompressible, left=1.23 Performing Technologist: Maudry Mayhew MHA, RVT, RDCS, RDMS  Examination Guidelines: A complete evaluation includes at minimum, Doppler waveform signals and systolic blood pressure reading at the level of bilateral brachial, anterior tibial, and posterior tibial arteries, when vessel segments are accessible. Bilateral testing is considered an integral part of a complete examination. Photoelectric Plethysmograph (PPG) waveforms and toe systolic pressure readings are included as required and additional duplex testing as needed. Limited examinations for reoccurring indications may be performed as noted.  ABI Findings: +--------+------------------+-----+---------+----------------------------------+ Right   Rt Pressure (mmHg)IndexWaveform Comment                            +--------+------------------+-----+---------+----------------------------------+ Brachial                                Not evaluated due  to                                                       bandaging/dialysis access           +--------+------------------+-----+---------+----------------------------------+ PTA     115               0.98 triphasic                                   +--------+------------------+-----+---------+----------------------------------+ DP      120               1.03 triphasic                                   +--------+------------------+-----+---------+----------------------------------+ +--------+------------------+-----+---------+-------+ Left    Lt Pressure (mmHg)IndexWaveform Comment +--------+------------------+-----+---------+-------+ [id number]                    triphasic        +--------+------------------+-----+---------+-------+ PTA     112               0.96 triphasic        +--------+------------------+-----+---------+-------+ DP      139               1.19 triphasic        +--------+------------------+-----+---------+-------+ +-------+-----------+-----------+---------------+------------+ ABI/TBIToday's ABIToday's TBIPrevious ABI   Previous TBI +-------+-----------+-----------+---------------+------------+ Right  1.03                  Noncompressible             +-------+-----------+-----------+---------------+------------+ Left   1.19                  1.23                        +-------+-----------+-----------+---------------+------------+ Bilateral ABIs appear essentially unchanged when compared to prior study.  Summary: Right: Resting right ankle-brachial index is within normal range. No evidence of significant right lower extremity arterial disease. Left: Resting left ankle-brachial index is within normal range. No evidence of significant left lower extremity arterial disease.  *See table(s) above for measurements and observations.  Electronically signed by Curt Jews MD on 08/11/2019 at 4:49:28 PM.   Final     Cardiac Studies   Echo: IMPRESSIONS    1. Left ventricular ejection fraction, by visual estimation, is 40 to 45%. The left  ventricle has mildly decreased function. There is no left ventricular hypertrophy.  2. The left ventricle has no regional wall motion abnormalities.  3. Global right ventricle has severely reduced systolic function.The right ventricular size is normal. No increase in right ventricular wall thickness.  4. Left atrial size was mildly dilated.  5. Right atrial size was mildly dilated.  6. The mitral valve is grossly normal. Mild mitral valve regurgitation.  7. The tricuspid valve is grossly normal.  8. The aortic valve is tricuspid. Aortic valve regurgitation is not visualized. Mild aortic valve sclerosis without stenosis.  9. The pulmonic valve was normal in structure. Pulmonic valve regurgitation is not visualized. 10. Mildly elevated pulmonary artery systolic pressure. 11. The atrial septum is grossly normal.  FINDINGS  Left Ventricle: Left ventricular ejection fraction, by visual estimation, is 40 to 45%. The left ventricle has mildly decreased function. The left ventricle has no regional wall motion abnormalities. There is no left ventricular hypertrophy. Left  ventricular diastolic parameters were normal.  Right Ventricle: The right ventricular size is normal. No increase in right ventricular wall thickness. Global RV systolic function is has severely reduced systolic function. The tricuspid regurgitant velocity is 2.37 m/s, and with an assumed right  atrial pressure of 10 mmHg, the estimated right ventricular systolic pressure is mildly elevated at 32.5 mmHg.  Left Atrium: Left atrial size was mildly dilated.  Right Atrium: Right atrial size was mildly dilated  Pericardium: There is no evidence of pericardial effusion.  Mitral Valve: The mitral valve is grossly normal. Mild mitral valve regurgitation.  Tricuspid Valve: The tricuspid valve is grossly normal. Tricuspid valve regurgitation moderate.  Aortic Valve: The aortic valve is tricuspid. . There is moderate thickening and  moderate calcification of the aortic valve. Aortic valve regurgitation is not visualized. Mild aortic valve sclerosis is present, with no evidence of aortic valve stenosis.  Moderate aortic valve annular calcification. There is moderate thickening of the aortic valve. There is moderate calcification of the aortic valve. Aortic valve mean gradient measures 11.0 mmHg. Aortic valve peak gradient measures 17.3 mmHg. Aortic valve  area, by VTI measures 2.46 cm.  Pulmonic Valve: The pulmonic valve was normal in structure. Pulmonic valve regurgitation is not visualized. Pulmonic regurgitation is not visualized.  Aorta: The aortic root and ascending aorta are structurally normal, with no evidence of dilitation.  IAS/Shunts: The atrial septum is grossly normal.   Patient Profile     86 y.o. male with MRSA bacteremia in the setting of septic arthritis of the left knee and end-stage renal disease.  Assessment & Plan    1.  Acute on chronic combined systolic and diastolic heart failure 2.  Coronary artery disease status post CABG with no active ischemic symptoms, mildly elevated troponin consistent with demand ischemia 3.  Indwelling CRT-D device  Discussed case with Dr. Megan Salon of infectious disease.  Plan to continue current therapy.  Treatment of heart failure is extremely limited because of low blood pressure and need for dialysis.  Will hold off on transesophageal echo for now because of his multiple comorbidities and somewhat tenuous respiratory status.  If he does not clear his bacteremia, I think he could have a TEE next week if necessary.    For questions or updates, please contact [place] Please consult [email or web address] for contact info under     Signed, Sherren Mocha, MD  08/12/2019, 11:25 AM

## 2019-08-12 NOTE — Progress Notes (Signed)
Patient ID: Shane [name], male   DOB: 04-Apr-1934, 86 y.o.   MRN: [id number]         [facility] for Infectious Disease  Date of Admission:  09/05/2019    Total days of antibiotics 3         ASSESSMENT: Shane [name] has MRSA bacteremia complicated by septic arthritis of his left knee and possibly left shoulder.  He is also at risk for endocarditis given the presence of his AICD.  However, I reviewed the risk benefit of proceeding with TEE Dr. Burt Knack and we agree with holding off on that for now and continuing vancomycin and rifampin.  His repeat blood cultures are negative at 48 hours.  PLAN: 1. Continue vancomycin and rifampin 2. Await results of repeat blood cultures 3. Possible left knee surgery tomorrow  Principal Problem:   MRSA bacteremia Active Problems:   Septic arthritis ([place])   Hypercholesterolemia   DM (diabetes mellitus), type 2 with renal complications (HCC)   Benign hypertensive heart disease without heart failure   Severe obstructive sleep apnea   Chronic combined systolic and diastolic CHF (congestive heart failure) ([place])   ICD (implantable cardioverter-defibrillator) discharge   ESRD (end stage renal disease) (HCC)   GERD (gastroesophageal reflux disease)   Acute exacerbation of CHF (congestive heart failure) (HCC)   Scheduled Meds: . allopurinol  100 mg Oral Daily  . amiodarone  200 mg Oral Daily  . aspirin EC  81 mg Oral QHS  . atorvastatin  80 mg Oral QPM  . calcitRIOL  0.25 mcg Oral Q M,W,F-HD  . calcium acetate  667 mg Oral TID WC  . Chlorhexidine Gluconate Cloth  6 each Topical Q0600  . darbepoetin (ARANESP) injection - DIALYSIS  60 mcg Intravenous Q Wed-HD  . fluticasone furoate-vilanterol  1 puff Inhalation Daily  . multivitamin  1 tablet Oral QHS  . mupirocin ointment  1 application Topical BID  . pantoprazole  40 mg Oral Daily  . rifampin  300 mg Oral Q8H  . sodium chloride flush  3 mL Intravenous Q12H  . sodium zirconium cyclosilicate  10  g Oral Once   Continuous Infusions: . sodium chloride    . vancomycin 1,000 mg (08/11/19 2205)   PRN Meds:.sodium chloride, acetaminophen, albuterol, calcium carbonate, fentaNYL (SUBLIMAZE) injection, ondansetron (ZOFRAN) IV, polyethylene glycol, sodium chloride flush   SUBJECTIVE: Shane [name] tells me that he has been somewhat confused today.  He is still having quite a bit of pain.  Is that he feels like his left knee pain is a little worse but his left shoulder pain is a little bit better.  Review of Systems: Review of Systems  Constitutional: Negative for chills, diaphoresis and fever.  Respiratory: Negative for cough.   Cardiovascular: Negative for chest pain.  Musculoskeletal: Positive for joint pain.    Allergies  Allergen Reactions  . Codeine Other (See Comments)    anxiety  . Dacarbazine Other (See Comments)  . Hydrocodone Other (See Comments)    Anxiety- can take in liquid form  . Pseudoephedrine Other (See Comments)    Causes heart to race  . Azithromycin Rash    OBJECTIVE: Vitals:   08/12/19 0100 08/12/19 0536 08/12/19 0734 08/12/19 1300  BP:  101/61  114/64  Pulse:  60  77  Resp:  20    Temp:  (!) 97.4 F (36.3 C)  97.9 F (36.6 C)  TempSrc:  Oral  Oral  SpO2:  97% 98% 100%  Weight: 99  kg      Body mass index is 32.23 kg/m.  Physical Exam Constitutional:      Comments: He is resting quietly in bed.  He is alert and pleasant.  His wife is at the bedside.  Cardiovascular:     Rate and Rhythm: Normal rate and regular rhythm.     Heart sounds: No murmur.     Comments: 2/6 systolic murmur. Pulmonary:     Effort: Pulmonary effort is normal.     Breath sounds: Normal breath sounds.  Abdominal:     Palpations: Abdomen is soft.     Tenderness: There is no abdominal tenderness.  Musculoskeletal:     Comments: His left shoulder remains swollen but it is not red or particularly warm.  His left knee is still slightly swollen and warm.     Lab  Results Lab Results  Component Value Date   WBC 6.1 08/12/2019   HGB 9.2 (L) 08/12/2019   HCT 29.6 (L) 08/12/2019   MCV 99.3 08/12/2019   PLT 34 (L) 08/12/2019    Lab Results  Component Value Date   CREATININE 4.57 (H) 08/12/2019   BUN 40 (H) 08/12/2019   NA 138 08/12/2019   K 5.2 (H) 08/12/2019   CL 96 (L) 08/12/2019   CO2 23 08/12/2019    Lab Results  Component Value Date   ALT 30 09/01/2019   AST 54 (H) 08/14/2019   ALKPHOS 110 08/20/2019   BILITOT 2.0 (H) 08/20/2019     Microbiology: Recent Results (from the past 240 hour(s))  Blood Culture (routine x 2)     Status: None (Preliminary result)   Collection Time: 08/09/2019  8:50 PM   Specimen: BLOOD LEFT FOREARM  Result Value Ref Range Status   Specimen Description BLOOD LEFT FOREARM  Final   Special Requests   Final    BOTTLES DRAWN AEROBIC AND ANAEROBIC Blood Culture adequate volume   Culture   Final    NO GROWTH 4 DAYS Performed at [facility], [place] [address], [place], [place] [address]    Report Status PENDING  Incomplete  Respiratory Panel by RT PCR (Flu A&B, Covid) - Nasopharyngeal Swab     Status: None   Collection Time: 08/18/2019 10:11 PM   Specimen: Nasopharyngeal Swab  Result Value Ref Range Status   SARS Coronavirus 2 by RT PCR NEGATIVE NEGATIVE Final    Comment: (NOTE) SARS-CoV-2 target nucleic acids are NOT DETECTED. The SARS-CoV-2 RNA is generally detectable in upper respiratoy specimens during the acute phase of infection. The lowest concentration of SARS-CoV-2 viral copies this assay can detect is 131 copies/mL. A negative result does not preclude SARS-Cov-2 infection and should not be used as the sole basis for treatment or other patient management decisions. A negative result may occur with  improper specimen collection/handling, submission of specimen other than nasopharyngeal swab, presence of viral mutation(s) within the areas targeted by this assay, and inadequate number of viral  copies (<131 copies/mL). A negative result must be combined with clinical observations, patient history, and epidemiological information. The expected result is Negative. Fact Sheet for Patients:  PinkCheek.be Fact Sheet for Healthcare Providers:  GravelBags.it This test is not yet ap proved or cleared by the [place] FDA and  has been authorized for detection and/or diagnosis of SARS-CoV-2 by FDA under an Emergency Use Authorization (EUA). This EUA will remain  in effect (meaning this test can be used) for the duration of the COVID-19 declaration under Section  564(b)(1) of the Act, 21 U.S.C. section 360bbb-3(b)(1), unless the authorization is terminated or revoked sooner.    Influenza A by PCR NEGATIVE NEGATIVE Final   Influenza B by PCR NEGATIVE NEGATIVE Final    Comment: (NOTE) The Xpert Xpress SARS-CoV-2/FLU/RSV assay is intended as an aid in  the diagnosis of influenza from Nasopharyngeal swab specimens and  should not be used as a sole basis for treatment. Nasal washings and  aspirates are unacceptable for Xpert Xpress SARS-CoV-2/FLU/RSV  testing. Fact Sheet for Patients: PinkCheek.be Fact Sheet for Healthcare Providers: GravelBags.it This test is not yet approved or cleared by the [place] FDA and  has been authorized for detection and/or diagnosis of SARS-CoV-2 by  FDA under an Emergency Use Authorization (EUA). This EUA will remain  in effect (meaning this test can be used) for the duration of the  Covid-19 declaration under Section 564(b)(1) of the Act, 21  U.S.C. section 360bbb-3(b)(1), unless the authorization is  terminated or revoked. Performed at [facility], [place] [address] [place], [place] [address]   Blood Culture (routine x 2)     Status: None (Preliminary result)   Collection Time: 08/09/19  1:20 AM   Specimen: BLOOD RIGHT HAND    Result Value Ref Range Status   Specimen Description BLOOD RIGHT HAND  Final   Special Requests   Final    BOTTLES DRAWN AEROBIC AND ANAEROBIC Blood Culture adequate volume   Culture   Final    NO GROWTH 3 DAYS Performed at [facility], [place] [address] [place], [place] [address]    Report Status PENDING  Incomplete  Culture, blood (routine x 2)     Status: Abnormal (Preliminary result)   Collection Time: 08/10/19  8:15 AM   Specimen: BLOOD  Result Value Ref Range Status   Specimen Description BLOOD LEFT ANTECUBITAL  Final   Special Requests   Final    BOTTLES DRAWN AEROBIC AND ANAEROBIC Blood Culture adequate volume   Culture  Setup Time   Final    IN BOTH AEROBIC AND ANAEROBIC BOTTLES GRAM POSITIVE COCCI CRITICAL RESULT CALLED TO, READ BACK BY AND VERIFIED WITH: A MEYER PHARMD 08/10/19 2047 JDW Performed at [facility], [place] [address], [place], [place] [address]    Culture STAPHYLOCOCCUS AUREUS VIRIDANS STREPTOCOCCUS  (A)  Final   Report Status PENDING  Incomplete  Culture, blood (routine x 2)     Status: Abnormal (Preliminary result)   Collection Time: 08/10/19  8:20 AM   Specimen: BLOOD  Result Value Ref Range Status   Specimen Description BLOOD LEFT ANTECUBITAL  Final   Special Requests   Final    BOTTLES DRAWN AEROBIC ONLY Blood Culture adequate volume   Culture  Setup Time   Final    AEROBIC BOTTLE ONLY GRAM POSITIVE COCCI CRITICAL RESULT CALLED TO, READ BACK BY AND VERIFIED WITH: J LEDFORD PHARMD 08/11/19 0000 JDW    Culture (A)  Final    STAPHYLOCOCCUS AUREUS SUSCEPTIBILITIES TO FOLLOW Performed at [facility], [place] [address], [place], [place] [address]    Report Status PENDING  Incomplete  Blood Culture ID Panel (Reflexed)     Status: Abnormal   Collection Time: 08/10/19  8:20 AM  Result Value Ref Range Status   Enterococcus species NOT DETECTED NOT DETECTED Final   Listeria monocytogenes NOT DETECTED NOT DETECTED Final   Staphylococcus  species DETECTED (A) NOT DETECTED Final    Comment: CRITICAL RESULT CALLED  TO, READ BACK BY AND VERIFIED WITH: J LEDFORD PHARMD 08/11/19 0000 JDW    Staphylococcus aureus (BCID) DETECTED (A) NOT DETECTED Final    Comment: Methicillin (oxacillin)-resistant Staphylococcus aureus (MRSA). MRSA is predictably resistant to beta-lactam antibiotics (except ceftaroline). Preferred therapy is vancomycin unless clinically contraindicated. Patient requires contact precautions if  hospitalized. CRITICAL RESULT CALLED TO, READ BACK BY AND VERIFIED WITH: J LEDFORD PHARMD 08/11/19 0000 JDW    Methicillin resistance DETECTED (A) NOT DETECTED Final    Comment: CRITICAL RESULT CALLED TO, READ BACK BY AND VERIFIED WITH: J LEDFORD PHARMD 08/11/19 0000 JDW    Streptococcus species NOT DETECTED NOT DETECTED Final   Streptococcus agalactiae NOT DETECTED NOT DETECTED Final   Streptococcus pneumoniae NOT DETECTED NOT DETECTED Final   Streptococcus pyogenes NOT DETECTED NOT DETECTED Final   Acinetobacter baumannii NOT DETECTED NOT DETECTED Final   Enterobacteriaceae species NOT DETECTED NOT DETECTED Final   Enterobacter cloacae complex NOT DETECTED NOT DETECTED Final   Escherichia coli NOT DETECTED NOT DETECTED Final   Klebsiella oxytoca NOT DETECTED NOT DETECTED Final   Klebsiella pneumoniae NOT DETECTED NOT DETECTED Final   Proteus species NOT DETECTED NOT DETECTED Final   Serratia marcescens NOT DETECTED NOT DETECTED Final   Haemophilus influenzae NOT DETECTED NOT DETECTED Final   Neisseria meningitidis NOT DETECTED NOT DETECTED Final   Pseudomonas aeruginosa NOT DETECTED NOT DETECTED Final   Candida albicans NOT DETECTED NOT DETECTED Final   Candida glabrata NOT DETECTED NOT DETECTED Final   Candida krusei NOT DETECTED NOT DETECTED Final   Candida parapsilosis NOT DETECTED NOT DETECTED Final   Candida tropicalis NOT DETECTED NOT DETECTED Final    Comment: Performed at [facility], [place] [address]  [place], [place] [address]  Stat Gram stain     Status: None   Collection Time: 08/10/19  3:37 PM   Specimen: Body Fluid  Result Value Ref Range Status   Specimen Description FLUID SYNOVIAL LEFT KNEE  Final   Special Requests NONE  Final   Gram Stain   Final    ABUNDANT WBC PRESENT,BOTH PMN AND MONONUCLEAR RARE GRAM POSITIVE COCCI Performed at [facility], [place] [address], [place], [place] [address]    Report Status 08/10/2019 FINAL  Final  Culture, body fluid-bottle     Status: Abnormal (Preliminary result)   Collection Time: 08/10/19  3:37 PM   Specimen: Fluid  Result Value Ref Range Status   Specimen Description FLUID SYNOVIAL LEFT KNEE  Final   Special Requests BOTTLES DRAWN AEROBIC AND ANAEROBIC  Final   Gram Stain   Final    GRAM POSITIVE COCCI RESULT CALLED TO, READ BACK BY AND VERIFIED WITH: L KING RN 08/11/19 1745 JDW IN BOTH AEROBIC AND ANAEROBIC BOTTLES Performed at [facility], [place] [address] [place], [place] [address]    Culture STAPHYLOCOCCUS AUREUS (A)  Final   Report Status PENDING  Incomplete  MRSA PCR Screening     Status: None   Collection Time: 08/10/19  5:03 PM   Specimen: Nasopharyngeal  Result Value Ref Range Status   MRSA by PCR NEGATIVE NEGATIVE Final    Comment:        The GeneXpert MRSA Assay (FDA approved for NASAL specimens only), is one component of a comprehensive MRSA colonization surveillance program. It is not intended to diagnose MRSA infection nor to guide or monitor treatment for MRSA infections. Performed at [facility], [place] [address] [place], [place]  [address]   Culture, blood (routine x 2)     Status: None (Preliminary result)   Collection Time: 08/11/19 11:59 AM   Specimen: BLOOD LEFT WRIST  Result Value Ref Range Status   Specimen Description BLOOD LEFT WRIST  Final   Special Requests   Final    BOTTLES DRAWN AEROBIC ONLY Blood Culture results may not be optimal due to an inadequate volume of blood received in  culture bottles   Culture   Final    NO GROWTH 1 DAY Performed at [facility], [place] [address] [place], [place] [address]    Report Status PENDING  Incomplete  Culture, blood (routine x 2)     Status: None (Preliminary result)   Collection Time: 08/11/19 12:10 PM   Specimen: BLOOD LEFT HAND  Result Value Ref Range Status   Specimen Description BLOOD LEFT HAND  Final   Special Requests   Final    BOTTLES DRAWN AEROBIC ONLY Blood Culture results may not be optimal due to an inadequate volume of blood received in culture bottles   Culture  Setup Time   Final    GRAM POSITIVE COCCI AEROBIC BOTTLE ONLY CRITICAL VALUE NOTED.  VALUE IS CONSISTENT WITH PREVIOUSLY REPORTED AND CALLED VALUE. Performed at [facility], [place] [address], [place], [place] [address]    Culture [facility] POSITIVE COCCI  Final   Report Status PENDING  Incomplete    Michel Bickers, MD [facility] [phone] pager   [phone] cell 08/12/2019, 5:07 PM

## 2019-08-13 ENCOUNTER — Other Ambulatory Visit: Payer: Self-pay | Admitting: Family Medicine

## 2019-08-13 LAB — CULTURE, BLOOD (ROUTINE X 2)
Culture: NO GROWTH
Special Requests: ADEQUATE
Special Requests: ADEQUATE
Special Requests: ADEQUATE

## 2019-08-13 LAB — CBC
HCT: 29.7 % — ABNORMAL LOW (ref 39.0–52.0)
Hemoglobin: 9 g/dL — ABNORMAL LOW (ref 13.0–17.0)
MCH: 31.9 pg (ref 26.0–34.0)
MCHC: 30.3 g/dL (ref 30.0–36.0)
MCV: 105.3 fL — ABNORMAL HIGH (ref 80.0–100.0)
Platelets: 26 10*3/uL — CL (ref 150–400)
RBC: 2.82 MIL/uL — ABNORMAL LOW (ref 4.22–5.81)
RDW: 18.4 % — ABNORMAL HIGH (ref 11.5–15.5)
WBC: 13.3 10*3/uL — ABNORMAL HIGH (ref 4.0–10.5)
nRBC: 2.2 % — ABNORMAL HIGH (ref 0.0–0.2)

## 2019-08-13 LAB — LACTIC ACID, PLASMA: Lactic Acid, Venous: >11 mmol/L (ref 0.5–1.9)

## 2019-08-13 LAB — CULTURE, BODY FLUID W GRAM STAIN -BOTTLE

## 2019-08-13 LAB — TROPONIN I (HIGH SENSITIVITY): Troponin I (High Sensitivity): 19037 ng/L (ref <18)

## 2019-08-13 SURGERY — ARTHROSCOPY, KNEE
Anesthesia: Choice | Site: Knee | Laterality: Left

## 2019-08-13 MED ORDER — EPINEPHRINE 1 MG/10ML IJ SOSY
PREFILLED_SYRINGE | INTRAMUSCULAR | Status: AC
  Filled 2019-08-13: qty 10

## 2019-08-13 MED ORDER — EPINEPHRINE PF 1 MG/ML IJ SOLN
INTRAMUSCULAR | Status: AC
  Filled 2019-08-13: qty 2

## 2019-08-13 MED FILL — Medication: Qty: 1 | Status: AC

## 2019-08-14 LAB — COMPREHENSIVE METABOLIC PANEL
ALT: 234 U/L — ABNORMAL HIGH (ref 0–44)
AST: 606 U/L — ABNORMAL HIGH (ref 15–41)
Albumin: 2.4 g/dL — ABNORMAL LOW (ref 3.5–5.0)
Alkaline Phosphatase: 90 U/L (ref 38–126)
Anion gap: 28 — ABNORMAL HIGH (ref 5–15)
BUN: 56 mg/dL — ABNORMAL HIGH (ref 8–23)
CO2: 13 mmol/L — ABNORMAL LOW (ref 22–32)
Calcium: 8.8 mg/dL — ABNORMAL LOW (ref 8.9–10.3)
Chloride: 93 mmol/L — ABNORMAL LOW (ref 98–111)
Creatinine, Ser: 5.76 mg/dL — ABNORMAL HIGH (ref 0.61–1.24)
GFR calc Af Amer: 10 mL/min — ABNORMAL LOW (ref >60)
GFR calc non Af Amer: 8 mL/min — ABNORMAL LOW (ref >60)
Glucose, Bld: <20 mg/dL — CL (ref 70–99)
Potassium: >7.5 mmol/L (ref 3.5–5.1)
Sodium: 134 mmol/L — ABNORMAL LOW (ref 135–145)
Total Bilirubin: 6.2 mg/dL — ABNORMAL HIGH (ref 0.3–1.2)
Total Protein: 6.2 g/dL — ABNORMAL LOW (ref 6.5–8.1)

## 2019-08-14 LAB — CULTURE, BLOOD (ROUTINE X 2)
Culture: NO GROWTH
Special Requests: ADEQUATE

## 2019-08-17 ENCOUNTER — Ambulatory Visit: Payer: Medicare Other | Admitting: Podiatry

## 2019-08-19 ENCOUNTER — Ambulatory Visit: Payer: Medicare Other | Admitting: Cardiovascular Disease

## 2019-09-06 NOTE — Code Documentation (Signed)
CODE BLUE NOTE  Patient Name: Shane [name]   MRN: [id number]   Date of Birth/ Sex: 04/24/1934 , male      Admission Date:   Attending Provider: Bonnell Public, MD  Primary Diagnosis: Acute exacerbation of CHF (congestive heart failure) ([place]) [I50.9]    Indication: Pt was in his usual state of health until this 1:50 AM, when he was noted to be unconscious. Code blue was subsequently called. At the time of arrival on scene, ACLS protocol was underway.   Technical Description:  - CPR performance duration:  41 minutes  - Was defibrillation or cardioversion used? Yes   - Was external pacer placed? No  - Was patient intubated pre/post CPR? Yes    Medications Administered: Y = Yes; Blank = No Amiodarone  y  Atropine    Calcium  y  Epinephrine  y  Lidocaine    Magnesium    Norepinephrine    Phenylephrine    Sodium bicarbonate  y  Vasopressin    Other     Post CPR evaluation:  - Final Status - Was patient successfully resuscitated ? No   Miscellaneous Information:  - Time of death:  02:31 AM  - Primary team notified?  Yes  - Family Notified? Yes, left voice message.         Lattie Haw, MD   2019/08/15, 2:33 AM

## 2019-09-06 NOTE — Discharge Summary (Signed)
Death discharge summary Admitted: 08-22-2019. Died on: 08/27/19  Brief summary: Patient was an 86 year old male with past medical history significant for ESRD on HD MWF; chronic combined systolic and diastolic CHF, with echocardiogram done in 2019 revealing an estimated Estimated LVEF 40-45%; s/p AICD, HTN, DM 2, HLD, CAD, chronic hypoxic respiratory failure on home oxygen 2 L/min at night, OSA reportedly cannot tolerate CPAP, frequent falls (6-7 times in the last 6 weeks), reported left shoulder fracture and right little toe fracture following a fall. Patient was admitted on 08/22/2019 with progressive dyspnea, hypoxia, lower extremity edema despite compliance with his outpatient HD. Initial working diagnosis was acute on chronic hypoxic respiratory failure due to decompensated CHF in a dialysis patient. Nephrology consulted to assist with patient's management. Patient was dialyzed during the hospital stay.  Hospital course was complicated by febrile illness.  Work-up revealed MRSA bacteremia and left knee septic arthritis.  Septic arthritis of the left shoulder could not be ruled out. Orthopedic team was consulted to assist with patient's management as patient continued to to have symptoms despite antibiotics. Patient eventually coded on 08-27-19, and died.  Cause of death includes: -MRSA bacteremia/sepsis -Septic arthritis -Acute on chronic combined systolic and diastolic congestive heart failure -ESRD on hemodialysis -Chronic respiratory failure on oxygen  Final event leading to death was cardiopulmonary arrest from above.

## 2019-09-06 DEATH — deceased

## 2019-09-26 IMAGING — DX DG CHEST 2V
4 series · 4 of 4 positions shown · non-contrast
Comparison: Chest radiograph performed 08/26/2017

CLINICAL DATA: Acute onset of fever. Bilateral lower extremity
swelling.

EXAM:
CHEST - 2 VIEW

[chest lat (1 of 2)]
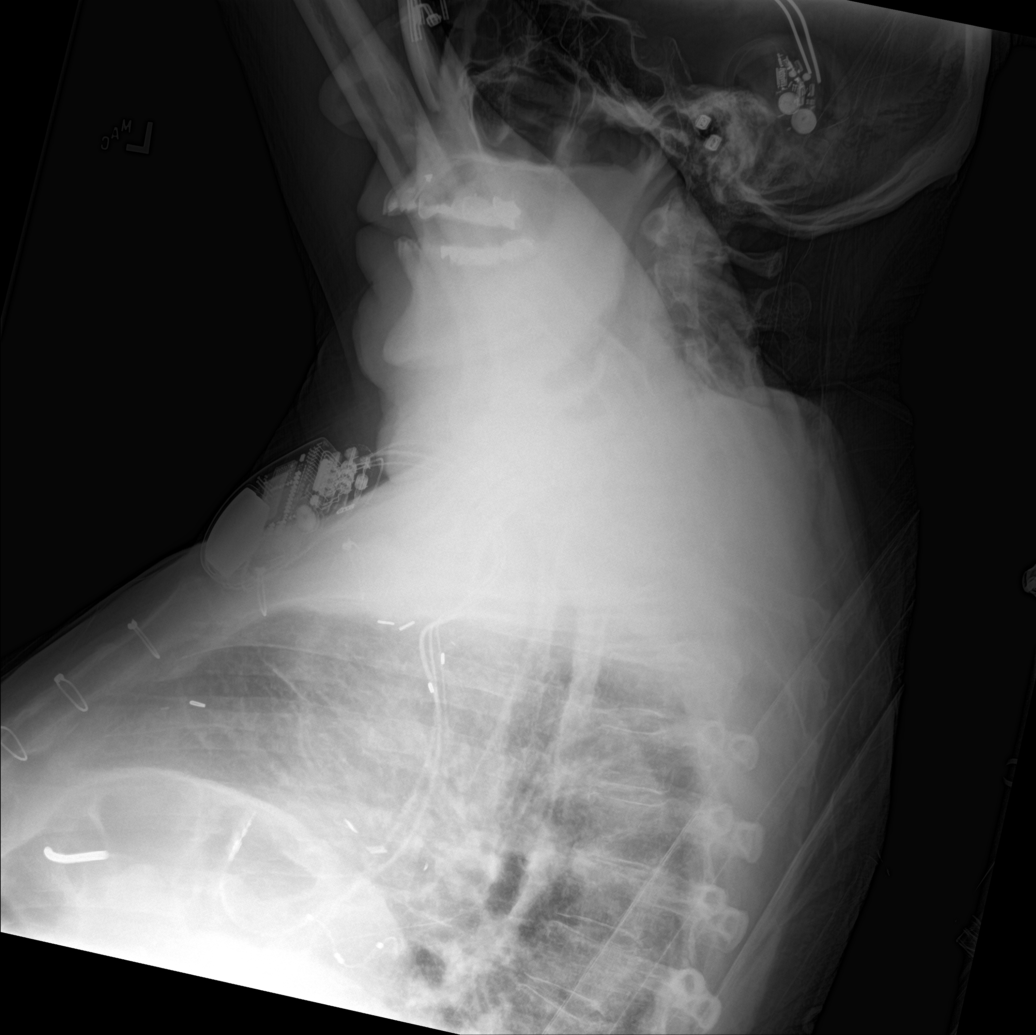

[chest ap (1 of 2)]
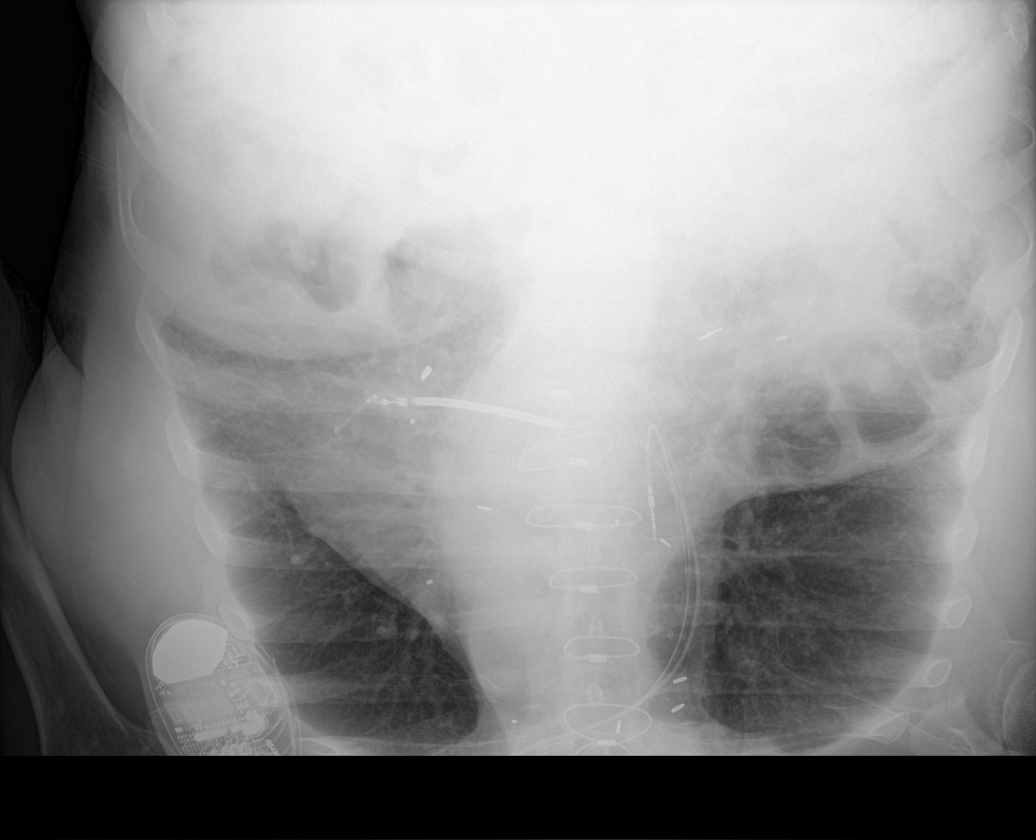

[chest ap (2 of 2)]
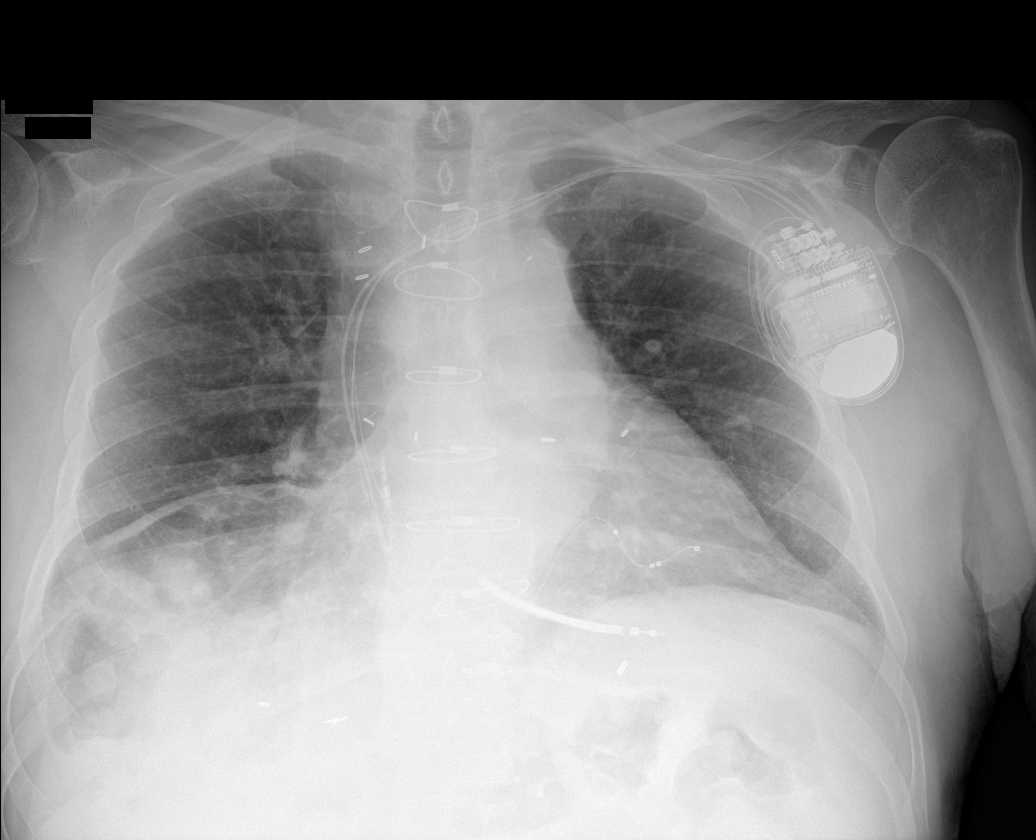

[chest lat (2 of 2)]
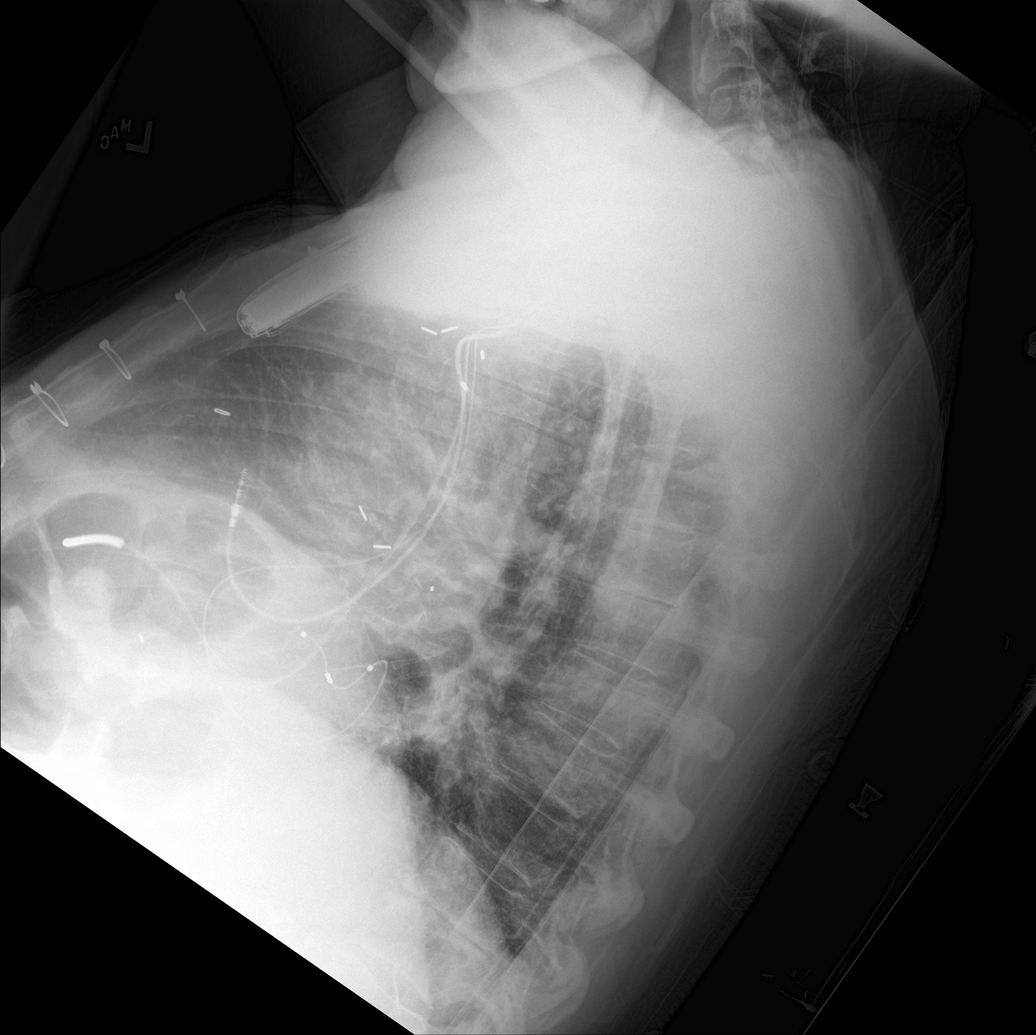

[4 of 4 positions shown; findings below may reference images not displayed]

FINDINGS: The lungs are well-aerated. There is mild elevation of the right
hemidiaphragm. There is no evidence of focal opacification, pleural
effusion or pneumothorax.

The heart is borderline normal in size. The patient is status post
median sternotomy. A pacemaker/AICD is noted at the left chest wall,
with leads ending at the right atrium, right ventricle and coronary
sinus. No acute osseous abnormalities are seen.
IMPRESSION: Mild elevation of the right hemidiaphragm. Lungs remain grossly
clear.

## 2019-10-28 ENCOUNTER — Ambulatory Visit: Payer: Medicare Other | Admitting: Internal Medicine

## 2020-09-05 DEATH — deceased
